# Patient Record
Sex: Male | Born: 1950 | Race: Black or African American | Hispanic: No | Marital: Single | Smoking: Current some day smoker
Health system: Southern US, Community
[De-identification: ages and names within clinical notes are randomized; demographics above are authoritative.]

## PROBLEM LIST (undated history)

## (undated) DIAGNOSIS — K5720 Diverticulitis of large intestine with perforation and abscess without bleeding: Secondary | ICD-10-CM

## (undated) DIAGNOSIS — K5792 Diverticulitis of intestine, part unspecified, without perforation or abscess without bleeding: Secondary | ICD-10-CM

## (undated) DIAGNOSIS — I5022 Chronic systolic (congestive) heart failure: Secondary | ICD-10-CM

## (undated) DIAGNOSIS — M1990 Unspecified osteoarthritis, unspecified site: Secondary | ICD-10-CM

## (undated) DIAGNOSIS — E0590 Thyrotoxicosis, unspecified without thyrotoxic crisis or storm: Secondary | ICD-10-CM

## (undated) DIAGNOSIS — J45909 Unspecified asthma, uncomplicated: Secondary | ICD-10-CM

## (undated) DIAGNOSIS — Z9119 Patient's noncompliance with other medical treatment and regimen: Secondary | ICD-10-CM

## (undated) DIAGNOSIS — I10 Essential (primary) hypertension: Secondary | ICD-10-CM

## (undated) DIAGNOSIS — I712 Thoracic aortic aneurysm, without rupture: Secondary | ICD-10-CM

## (undated) DIAGNOSIS — I428 Other cardiomyopathies: Secondary | ICD-10-CM

## (undated) DIAGNOSIS — Z91199 Patient's noncompliance with other medical treatment and regimen due to unspecified reason: Secondary | ICD-10-CM

## (undated) DIAGNOSIS — I4819 Other persistent atrial fibrillation: Secondary | ICD-10-CM

## (undated) DIAGNOSIS — I7121 Aneurysm of the ascending aorta, without rupture: Secondary | ICD-10-CM

## (undated) HISTORY — DX: Diverticulitis of large intestine with perforation and abscess without bleeding: K57.20

## (undated) HISTORY — DX: Thoracic aortic aneurysm, without rupture: I71.2

## (undated) HISTORY — PX: JOINT REPLACEMENT: SHX530

## (undated) HISTORY — DX: Chronic systolic (congestive) heart failure: I50.22

## (undated) HISTORY — PX: TESTICLE SURGERY: SHX794

## (undated) HISTORY — DX: Other cardiomyopathies: I42.8

## (undated) HISTORY — DX: Aneurysm of the ascending aorta, without rupture: I71.21

## (undated) HISTORY — DX: Diverticulitis of intestine, part unspecified, without perforation or abscess without bleeding: K57.92

---

## 2015-09-06 DIAGNOSIS — L0291 Cutaneous abscess, unspecified: Secondary | ICD-10-CM | POA: Insufficient documentation

## 2016-06-03 ENCOUNTER — Emergency Department: Payer: Medicare (Managed Care)

## 2016-06-03 ENCOUNTER — Encounter: Payer: Self-pay | Admitting: Emergency Medicine

## 2016-06-03 ENCOUNTER — Emergency Department
Admission: EM | Admit: 2016-06-03 | Discharge: 2016-06-03 | Disposition: A | Discharge status: Home / Self Care | Payer: Medicare (Managed Care) | Attending: Emergency Medicine | Admitting: Emergency Medicine

## 2016-06-03 DIAGNOSIS — I10 Essential (primary) hypertension: Secondary | ICD-10-CM | POA: Diagnosis not present

## 2016-06-03 DIAGNOSIS — Z79899 Other long term (current) drug therapy: Secondary | ICD-10-CM | POA: Insufficient documentation

## 2016-06-03 DIAGNOSIS — R0789 Other chest pain: Secondary | ICD-10-CM | POA: Diagnosis not present

## 2016-06-03 DIAGNOSIS — E0590 Thyrotoxicosis, unspecified without thyrotoxic crisis or storm: Secondary | ICD-10-CM | POA: Diagnosis not present

## 2016-06-03 DIAGNOSIS — F17200 Nicotine dependence, unspecified, uncomplicated: Secondary | ICD-10-CM | POA: Diagnosis not present

## 2016-06-03 HISTORY — DX: Essential (primary) hypertension: I10

## 2016-06-03 HISTORY — DX: Unspecified osteoarthritis, unspecified site: M19.90

## 2016-06-03 LAB — CBC
HEMATOCRIT: 41.5 % (ref 40.0–52.0)
Hemoglobin: 14 g/dL (ref 13.0–18.0)
MCH: 27.6 pg (ref 26.0–34.0)
MCHC: 33.8 g/dL (ref 32.0–36.0)
MCV: 81.7 fL (ref 80.0–100.0)
Platelets: 168 10*3/uL (ref 150–440)
RBC: 5.08 MIL/uL (ref 4.40–5.90)
RDW: 15.4 % — ABNORMAL HIGH (ref 11.5–14.5)
WBC: 4.1 10*3/uL (ref 3.8–10.6)

## 2016-06-03 LAB — TSH

## 2016-06-03 LAB — BASIC METABOLIC PANEL
ANION GAP: 5 (ref 5–15)
BUN: 13 mg/dL (ref 6–20)
CHLORIDE: 103 mmol/L (ref 101–111)
CO2: 29 mmol/L (ref 22–32)
Calcium: 8.9 mg/dL (ref 8.9–10.3)
Creatinine, Ser: 0.76 mg/dL (ref 0.61–1.24)
GFR calc Af Amer: >60 mL/min (ref >60)
GLUCOSE: 100 mg/dL — AB (ref 65–99)
POTASSIUM: 3.6 mmol/L (ref 3.5–5.1)
SODIUM: 137 mmol/L (ref 135–145)

## 2016-06-03 LAB — TROPONIN I: Troponin I: 0.03 ng/mL (ref <0.03)

## 2016-06-03 MED ORDER — LISINOPRIL 20 MG PO TABS: 20.0000 mg | ORAL_TABLET | Freq: Every day | ORAL | 0 refills | Status: DC

## 2016-06-03 MED ORDER — KETOROLAC TROMETHAMINE 30 MG/ML IJ SOLN
30.0000 mg | Freq: Once | INTRAMUSCULAR | Status: AC
  Administered 2016-06-03: 30 mg via INTRAVENOUS
  Filled 2016-06-03: qty 1

## 2016-06-03 MED ORDER — TRAMADOL HCL 50 MG PO TABS: 50.0000 mg | ORAL_TABLET | Freq: Four times a day (QID) | ORAL | 0 refills | Status: DC | PRN

## 2016-06-03 MED ORDER — TRAMADOL HCL 50 MG PO TABS
50.0000 mg | ORAL_TABLET | Freq: Once | ORAL | Status: AC
  Administered 2016-06-03: 50 mg via ORAL
  Filled 2016-06-03: qty 1

## 2016-06-03 MED ORDER — PROPYLTHIOURACIL 50 MG PO TABS: 50.0000 mg | ORAL_TABLET | Freq: Three times a day (TID) | ORAL | 0 refills | Status: DC

## 2016-06-03 MED ORDER — DIGOXIN 125 MCG PO TABS: 0.1250 mg | ORAL_TABLET | Freq: Every day | ORAL | 0 refills | Status: DC

## 2016-06-03 MED ORDER — IOPAMIDOL (ISOVUE-370) INJECTION 76%
75.0000 mL | Freq: Once | INTRAVENOUS | Status: AC | PRN
  Administered 2016-06-03: 75 mL via INTRAVENOUS

## 2016-06-03 MED ORDER — NAPROXEN SODIUM 550 MG PO TABS: 550.0000 mg | ORAL_TABLET | Freq: Two times a day (BID) | ORAL | 0 refills | Status: DC | PRN

## 2016-06-03 MED ORDER — ASPIRIN EC 325 MG PO TBEC
325.0000 mg | DELAYED_RELEASE_TABLET | Freq: Once | ORAL | Status: AC
  Administered 2016-06-03: 325 mg via ORAL
  Filled 2016-06-03: qty 1

## 2016-06-03 MED ORDER — METHIMAZOLE 10 MG PO TABS: 20.0000 mg | ORAL_TABLET | Freq: Three times a day (TID) | ORAL | 0 refills | Status: DC

## 2016-06-03 NOTE — Discharge Instructions (Signed)
Please take your medicines as prescribed.   You need to see a primary care doctor to get your meds refilled.   Your thyroid levels are off since you are out of meds. Need to have your thyroid levels rechecked after you are back on the medicines.   Return to ER if you have worse chest pain, shortness of breath, palpitations, fevers.

## 2016-06-03 NOTE — ED Triage Notes (Signed)
Pt brought over from [facility] with c/o chest pain and arthritis pain. Pt states has been out of his heart and arthritis medicines for a "while".

## 2016-06-03 NOTE — ED Notes (Signed)
Report off to kuch rn  

## 2016-06-03 NOTE — ED Notes (Signed)
meds given. Pt alert 

## 2016-06-03 NOTE — ED Notes (Signed)
Patient transported to CT 

## 2016-06-03 NOTE — ED Provider Notes (Signed)
[facility]-EMERGENCY DEPARTMENT Provider Note   CSN: [id number] Arrival date & time: 06/03/16  1404     History   Chief Complaint Chief Complaint  Patient presents with  . Chest Pain    HPI Eugene Henderson is a 65 y.o. male hx of HTN, arthritis, afib off anticoagulation, Who presented with chest pain, shortness of breath. Patient states that he ran out of his blood pressure medicines for the last month or so. Last week or so, he has been having worse shortness of breath on exertion. He states that at baseline, he uses a wheelchair since he has chronic back problems. He tries to walk to the bathroom and feels very short of breath and had chest tightness over the last week. Today he finally went to see his doctor was sent in for evaluation. Of note, patient has a history of atrial fibrillation but decided not to take his blood thinners. Also out of his high blood pressure medicines for the last month or so. He just moved here from [place] 3 months ago.   The history is provided by the patient.    Past Medical History:  Diagnosis Date  . Arthritis   . Hypertension     There are no active problems to display for this patient.   Past Surgical History:  Procedure Laterality Date  . JOINT REPLACEMENT         Home Medications    Prior to Admission medications   Medication Sig Start Date End Date Taking? Authorizing Provider  digoxin (LANOXIN) 0.125 MG tablet Take 0.125 mg by mouth daily.   Yes Historical Provider, MD  furosemide (LASIX) 40 MG tablet Take 40 mg by mouth 2 (two) times daily.   Yes Historical Provider, MD  lisinopril (PRINIVIL,ZESTRIL) 20 MG tablet Take 20 mg by mouth daily.   Yes Historical Provider, MD  methimazole (TAPAZOLE) 10 MG tablet Take 20 mg by mouth 3 (three) times daily.   Yes Historical Provider, MD  naproxen sodium (ANAPROX) 550 MG tablet Take 550 mg by mouth 2 (two) times daily as needed.   Yes Historical Provider, MD  propylthiouracil (PTU) 50 MG tablet  Take 50 mg by mouth every 8 (eight) hours.   Yes Historical Provider, MD    Family History No family history on file.  Social History Social History  Substance Use Topics  . Smoking status: Current Some Day Smoker  . Smokeless tobacco: Never Used  . Alcohol use Yes     Allergies   Patient has no known allergies.   Review of Systems Review of Systems  Respiratory: Positive for shortness of breath.   Cardiovascular: Positive for chest pain.  All other systems reviewed and are negative.    Physical Exam Updated Vital Signs BP (!) 130/100   Pulse 93   Temp 97.8 F (36.6 C) (Oral)   Resp 16   Ht 6' (1.829 m)   Wt 285 lb (129.3 kg)   SpO2 100%   BMI 38.65 kg/m   Physical Exam  Constitutional: He is oriented to person, place, and time.  Chronically ill, slightly tachy   HENT:  Head: Normocephalic.  Eyes: Pupils are equal, round, and reactive to light.  Neck: Normal range of motion. Neck supple.  Cardiovascular: Regular rhythm.   Slightly tachy   Pulmonary/Chest: Effort normal and breath sounds normal. No respiratory distress. He has no wheezes.  Abdominal: Soft. Bowel sounds are normal. He exhibits no distension. There is no tenderness.  Musculoskeletal: Normal range of  motion.  Mild bilateral calf tenderness   Neurological: He is alert and oriented to person, place, and time. He displays normal reflexes. No cranial nerve deficit. Coordination normal.  Skin: Skin is warm.  Psychiatric: He has a normal mood and affect.  Nursing note and vitals reviewed.    ED Treatments / Results  Labs (all labs ordered are listed, but only abnormal results are displayed) Labs Reviewed  BASIC METABOLIC PANEL - Abnormal; Notable for the following:       Result Value   Glucose, Bld 100 (*)    All other components within normal limits  CBC - Abnormal; Notable for the following:    RDW 15.4 (*)    All other components within normal limits  TROPONIN I - Abnormal; Notable for  the following:    Troponin I 0.03 (*)    All other components within normal limits  TSH - Abnormal; Notable for the following:    TSH <0.010 (*)    All other components within normal limits  TROPONIN I    EKG  EKG Interpretation None      ED ECG REPORT I, Richardean Canal, the attending physician, personally viewed and interpreted this ECG.   Date: 06/03/2016  EKG Time: 14:37 pm  Rate: 96  Rhythm: atrial fibrillation, rate 96  Axis: normal  Intervals:none  ST&T Change: nonspecific    Radiology Dg Chest 2 View  Result Date: 06/03/2016 CLINICAL DATA:  Pt brought over from [facility] with c/o chest pain and arthritis pain intermittent x 3-4 days. Pt states has been out of his heart and arthritis medicines for a "while". Hx HTN. Current some day smoker EXAM: CHEST  2 VIEW COMPARISON:  None. FINDINGS: Normal cardiac silhouette ectatic aorta. No effusion, infiltrate pneumothorax. Fine linear opacities at the lung bases . No pleural fluid. Lungs mildly hyperinflated. Degenerative osteophytosis of the spine. On lateral projection there is a 10 mm density projecting over the posterior column of the T6 vertebral body. IMPRESSION: 1. Mild interstitial edema or bronchiolitis pattern. 2. Pulmonary nodule versus osteophyte projecting over the spine on the lateral projection. Recommend CT thorax for evaluation. Electronically Signed   By: Genevive Bi M.D.   On: 06/03/2016 15:09   Ct Angio Chest Pe W And/or Wo Contrast  Result Date: 06/03/2016 CLINICAL DATA:  Chest pain for 2-3 days EXAM: CT ANGIOGRAPHY CHEST WITH CONTRAST TECHNIQUE: Multidetector CT imaging of the chest was performed using the standard protocol during bolus administration of intravenous contrast. Multiplanar CT image reconstructions and MIPs were obtained to evaluate the vascular anatomy. CONTRAST:  75 mL Isovue 370 COMPARISON:  None. FINDINGS: Cardiovascular: Satisfactory opacification of the pulmonary arteries to the segmental level.  No evidence of pulmonary embolism. Enlarged heart size. No pericardial effusion. Coronary artery atherosclerosis in the LAD. Mediastinum/Nodes: No axillary or hilar lymphadenopathy. Mild prominence of the mediastinal lymph nodes with the largest right lower paratracheal lymph node measuring 12 mm in short axis. Thyroid gland, trachea, and esophagus demonstrate no significant findings. Lungs/Pleura: Lungs are clear. No pleural effusion or pneumothorax. Upper Abdomen: No acute abnormality. Musculoskeletal: No chest wall abnormality. No acute or significant osseous findings. Review of the MIP images confirms the above findings. IMPRESSION: 1. No evidence of pulmonary embolus. 2. No thoracic aortic aneurysm or dissection. 3. Cardiomegaly. Electronically Signed   By: Elige Ko   On: 06/03/2016 16:53    Procedures Procedures (including critical care time)  Medications Ordered in ED Medications  aspirin EC tablet 325  mg (325 mg Oral Given 06/03/16 1610)  iopamidol (ISOVUE-370) 76 % injection 75 mL (75 mLs Intravenous Contrast Given 06/03/16 1633)  ketorolac (TORADOL) 30 MG/ML injection 30 mg (30 mg Intravenous Given 06/03/16 1859)  traMADol (ULTRAM) tablet 50 mg (50 mg Oral Given 06/03/16 1859)     Initial Impression / Assessment and Plan / ED Course  I have reviewed the triage vital signs and the nursing notes.  Pertinent labs & imaging results that were available during my care of the patient were reviewed by me and considered in my medical decision making (see chart for details).  Clinical Course    Eugene Henderson is a 65 y.o. male here with chest pain, shortness of breath. Hx of afib but not taking anticoagulants. Also wheelchair bound so at risk for PE. Will get labs, trop x 2, CT angio.   7:00 PM CT showed no PE. Delta trop neg (initial was 0.03 and second one is < 0.03). TSH < 0.01. He has been out of his methimazole, PTU for a month and likely explains this. I will refill his home meds.        Final Clinical Impressions(s) / ED Diagnoses   Final diagnoses:  None    New Prescriptions New Prescriptions   No medications on file     Charlynne Pander, MD 06/03/16 1902

## 2016-06-03 NOTE — ED Notes (Signed)
Pt alert.  nsr on monitor.   Iv in place.  Skin warm and dry. pt Watching tv

## 2016-06-03 NOTE — ED Notes (Signed)
Pt reports chest pain for 2-3 days.  Pt out of meds for 1 month.  Pt has sob intermittently.  Pt has tightness across chest today.  Pt alert.  md at bedside.

## 2016-07-28 ENCOUNTER — Encounter: Payer: Self-pay | Admitting: Emergency Medicine

## 2016-07-28 ENCOUNTER — Emergency Department: Payer: Medicare (Managed Care)

## 2016-07-28 ENCOUNTER — Inpatient Hospital Stay
Admission: EM | Admit: 2016-07-28 | Discharge: 2016-08-04 | DRG: 287 | Disposition: A | Discharge status: Home Health | Payer: Medicare (Managed Care) | Attending: Internal Medicine | Admitting: Internal Medicine

## 2016-07-28 DIAGNOSIS — Z9119 Patient's noncompliance with other medical treatment and regimen: Secondary | ICD-10-CM | POA: Diagnosis not present

## 2016-07-28 DIAGNOSIS — I5033 Acute on chronic diastolic (congestive) heart failure: Secondary | ICD-10-CM

## 2016-07-28 DIAGNOSIS — I4892 Unspecified atrial flutter: Secondary | ICD-10-CM | POA: Diagnosis present

## 2016-07-28 DIAGNOSIS — F1010 Alcohol abuse, uncomplicated: Secondary | ICD-10-CM | POA: Diagnosis present

## 2016-07-28 DIAGNOSIS — F17210 Nicotine dependence, cigarettes, uncomplicated: Secondary | ICD-10-CM | POA: Diagnosis present

## 2016-07-28 DIAGNOSIS — F419 Anxiety disorder, unspecified: Secondary | ICD-10-CM | POA: Diagnosis present

## 2016-07-28 DIAGNOSIS — Z9114 Patient's other noncompliance with medication regimen: Secondary | ICD-10-CM | POA: Diagnosis not present

## 2016-07-28 DIAGNOSIS — I110 Hypertensive heart disease with heart failure: Secondary | ICD-10-CM | POA: Diagnosis present

## 2016-07-28 DIAGNOSIS — Z833 Family history of diabetes mellitus: Secondary | ICD-10-CM

## 2016-07-28 DIAGNOSIS — I248 Other forms of acute ischemic heart disease: Secondary | ICD-10-CM | POA: Diagnosis present

## 2016-07-28 DIAGNOSIS — Z23 Encounter for immunization: Secondary | ICD-10-CM

## 2016-07-28 DIAGNOSIS — I481 Persistent atrial fibrillation: Secondary | ICD-10-CM

## 2016-07-28 DIAGNOSIS — R7989 Other specified abnormal findings of blood chemistry: Secondary | ICD-10-CM | POA: Diagnosis present

## 2016-07-28 DIAGNOSIS — R002 Palpitations: Secondary | ICD-10-CM | POA: Diagnosis not present

## 2016-07-28 DIAGNOSIS — E0590 Thyrotoxicosis, unspecified without thyrotoxic crisis or storm: Secondary | ICD-10-CM | POA: Diagnosis present

## 2016-07-28 DIAGNOSIS — R0602 Shortness of breath: Secondary | ICD-10-CM | POA: Diagnosis not present

## 2016-07-28 DIAGNOSIS — I4819 Other persistent atrial fibrillation: Secondary | ICD-10-CM | POA: Diagnosis present

## 2016-07-28 DIAGNOSIS — I10 Essential (primary) hypertension: Secondary | ICD-10-CM

## 2016-07-28 DIAGNOSIS — I255 Ischemic cardiomyopathy: Secondary | ICD-10-CM | POA: Diagnosis present

## 2016-07-28 DIAGNOSIS — Z79899 Other long term (current) drug therapy: Secondary | ICD-10-CM | POA: Diagnosis not present

## 2016-07-28 DIAGNOSIS — R0600 Dyspnea, unspecified: Secondary | ICD-10-CM | POA: Diagnosis not present

## 2016-07-28 DIAGNOSIS — R000 Tachycardia, unspecified: Secondary | ICD-10-CM | POA: Diagnosis present

## 2016-07-28 DIAGNOSIS — I5043 Acute on chronic combined systolic (congestive) and diastolic (congestive) heart failure: Secondary | ICD-10-CM | POA: Diagnosis not present

## 2016-07-28 DIAGNOSIS — R0603 Acute respiratory distress: Secondary | ICD-10-CM | POA: Diagnosis not present

## 2016-07-28 DIAGNOSIS — I272 Pulmonary hypertension, unspecified: Secondary | ICD-10-CM | POA: Diagnosis present

## 2016-07-28 DIAGNOSIS — R778 Other specified abnormalities of plasma proteins: Secondary | ICD-10-CM

## 2016-07-28 DIAGNOSIS — R748 Abnormal levels of other serum enzymes: Secondary | ICD-10-CM | POA: Diagnosis not present

## 2016-07-28 DIAGNOSIS — R911 Solitary pulmonary nodule: Secondary | ICD-10-CM | POA: Diagnosis present

## 2016-07-28 DIAGNOSIS — E876 Hypokalemia: Secondary | ICD-10-CM | POA: Diagnosis not present

## 2016-07-28 DIAGNOSIS — Z91148 Patient's other noncompliance with medication regimen for other reason: Secondary | ICD-10-CM

## 2016-07-28 DIAGNOSIS — Z6839 Body mass index (BMI) 39.0-39.9, adult: Secondary | ICD-10-CM | POA: Diagnosis not present

## 2016-07-28 HISTORY — DX: Thyrotoxicosis, unspecified without thyrotoxic crisis or storm: E05.90

## 2016-07-28 HISTORY — DX: Unspecified asthma, uncomplicated: J45.909

## 2016-07-28 HISTORY — DX: Patient's noncompliance with other medical treatment and regimen due to unspecified reason: Z91.199

## 2016-07-28 HISTORY — DX: Patient's noncompliance with other medical treatment and regimen: Z91.19

## 2016-07-28 HISTORY — DX: Other persistent atrial fibrillation: I48.19

## 2016-07-28 LAB — PROTIME-INR
INR: 1.07
PROTHROMBIN TIME: 13.9 s (ref 11.4–15.2)

## 2016-07-28 LAB — CBC WITH DIFFERENTIAL/PLATELET
BASOS ABS: 0 10*3/uL (ref 0–0.1)
BASOS PCT: 0 %
EOS PCT: 2 %
Eosinophils Absolute: 0.1 10*3/uL (ref 0–0.7)
HCT: 42.2 % (ref 40.0–52.0)
Hemoglobin: 14.3 g/dL (ref 13.0–18.0)
Lymphocytes Relative: 27 %
Lymphs Abs: 1.3 10*3/uL (ref 1.0–3.6)
MCH: 27.5 pg (ref 26.0–34.0)
MCHC: 33.9 g/dL (ref 32.0–36.0)
MCV: 81.1 fL (ref 80.0–100.0)
MONO ABS: 0.7 10*3/uL (ref 0.2–1.0)
Monocytes Relative: 14 %
Neutro Abs: 2.8 10*3/uL (ref 1.4–6.5)
Neutrophils Relative %: 57 %
PLATELETS: 159 10*3/uL (ref 150–440)
RBC: 5.21 MIL/uL (ref 4.40–5.90)
RDW: 15.9 % — AB (ref 11.5–14.5)
WBC: 5 10*3/uL (ref 3.8–10.6)

## 2016-07-28 LAB — BASIC METABOLIC PANEL
Anion gap: 7 (ref 5–15)
BUN: 13 mg/dL (ref 6–20)
CO2: 28 mmol/L (ref 22–32)
CREATININE: 0.77 mg/dL (ref 0.61–1.24)
Calcium: 9 mg/dL (ref 8.9–10.3)
Chloride: 102 mmol/L (ref 101–111)
Glucose, Bld: 133 mg/dL — ABNORMAL HIGH (ref 65–99)
POTASSIUM: 3.8 mmol/L (ref 3.5–5.1)
SODIUM: 137 mmol/L (ref 135–145)

## 2016-07-28 LAB — TROPONIN I
TROPONIN I: 0.05 ng/mL — AB (ref <0.03)
Troponin I: 0.04 ng/mL (ref <0.03)
Troponin I: 0.04 ng/mL (ref <0.03)
Troponin I: 0.04 ng/mL (ref <0.03)

## 2016-07-28 LAB — FIBRIN DERIVATIVES D-DIMER (ARMC ONLY): FIBRIN DERIVATIVES D-DIMER (ARMC): 1049 — AB (ref 0–499)

## 2016-07-28 LAB — GLUCOSE, CAPILLARY: Glucose-Capillary: 127 mg/dL — ABNORMAL HIGH (ref 65–99)

## 2016-07-28 LAB — APTT: APTT: 25 s (ref 24–36)

## 2016-07-28 LAB — MAGNESIUM: Magnesium: 1.7 mg/dL (ref 1.7–2.4)

## 2016-07-28 LAB — BRAIN NATRIURETIC PEPTIDE: B NATRIURETIC PEPTIDE 5: 526 pg/mL — AB (ref 0.0–100.0)

## 2016-07-28 LAB — HEPARIN LEVEL (UNFRACTIONATED): HEPARIN UNFRACTIONATED: 0.27 [IU]/mL — AB (ref 0.30–0.70)

## 2016-07-28 MED ORDER — FUROSEMIDE 10 MG/ML IJ SOLN
20.0000 mg | Freq: Two times a day (BID) | INTRAMUSCULAR | Status: DC
  Administered 2016-07-28 – 2016-07-29 (×2): 20 mg via INTRAVENOUS
  Filled 2016-07-28 (×2): qty 2

## 2016-07-28 MED ORDER — HEPARIN (PORCINE) IN NACL 100-0.45 UNIT/ML-% IJ SOLN: 12.0000 [IU]/kg/h | Freq: Once | INTRAMUSCULAR | Status: DC

## 2016-07-28 MED ORDER — NAPROXEN 500 MG PO TABS
500.0000 mg | ORAL_TABLET | Freq: Once | ORAL | Status: AC
  Administered 2016-07-28: 500 mg via ORAL
  Filled 2016-07-28: qty 1

## 2016-07-28 MED ORDER — TRAMADOL HCL 50 MG PO TABS
50.0000 mg | ORAL_TABLET | Freq: Four times a day (QID) | ORAL | Status: DC | PRN
  Administered 2016-07-28 – 2016-08-04 (×20): 50 mg via ORAL
  Filled 2016-07-28 (×19): qty 1

## 2016-07-28 MED ORDER — ONDANSETRON HCL 4 MG/2ML IJ SOLN: 4.0000 mg | Freq: Four times a day (QID) | INTRAMUSCULAR | Status: DC | PRN

## 2016-07-28 MED ORDER — HEPARIN BOLUS VIA INFUSION
1600.0000 [IU] | Freq: Once | INTRAVENOUS | Status: AC
  Administered 2016-07-28: 1600 [IU] via INTRAVENOUS
  Filled 2016-07-28: qty 1600

## 2016-07-28 MED ORDER — ONDANSETRON HCL 4 MG PO TABS: 4.0000 mg | ORAL_TABLET | Freq: Four times a day (QID) | ORAL | Status: DC | PRN

## 2016-07-28 MED ORDER — ACETAMINOPHEN 325 MG PO TABS
650.0000 mg | ORAL_TABLET | Freq: Four times a day (QID) | ORAL | Status: DC | PRN
  Administered 2016-07-28: 650 mg via ORAL
  Filled 2016-07-28: qty 2

## 2016-07-28 MED ORDER — NAPROXEN 500 MG PO TABS: 250.0000 mg | ORAL_TABLET | Freq: Once | ORAL | Status: DC

## 2016-07-28 MED ORDER — ENOXAPARIN SODIUM 150 MG/ML ~~LOC~~ SOLN
1.0000 mg/kg | Freq: Once | SUBCUTANEOUS | Status: DC
  Filled 2016-07-28: qty 0.88

## 2016-07-28 MED ORDER — NITROGLYCERIN 2 % TD OINT
1.0000 [in_us] | TOPICAL_OINTMENT | Freq: Once | TRANSDERMAL | Status: AC
  Administered 2016-07-28: 1 [in_us] via TOPICAL
  Filled 2016-07-28: qty 1

## 2016-07-28 MED ORDER — SODIUM CHLORIDE 0.9% FLUSH
3.0000 mL | Freq: Two times a day (BID) | INTRAVENOUS | Status: DC
  Administered 2016-07-29 – 2016-08-01 (×6): 3 mL via INTRAVENOUS

## 2016-07-28 MED ORDER — HEPARIN BOLUS VIA INFUSION
4000.0000 [IU] | Freq: Once | INTRAVENOUS | Status: AC
  Administered 2016-07-28: 4000 [IU] via INTRAVENOUS
  Filled 2016-07-28: qty 4000

## 2016-07-28 MED ORDER — POTASSIUM CHLORIDE CRYS ER 20 MEQ PO TBCR
20.0000 meq | EXTENDED_RELEASE_TABLET | Freq: Once | ORAL | Status: AC
  Administered 2016-08-02: 20 meq via ORAL
  Filled 2016-07-28: qty 1

## 2016-07-28 MED ORDER — METHIMAZOLE 10 MG PO TABS
20.0000 mg | ORAL_TABLET | Freq: Three times a day (TID) | ORAL | Status: DC
  Administered 2016-07-28 – 2016-08-04 (×20): 20 mg via ORAL
  Filled 2016-07-28 (×22): qty 2

## 2016-07-28 MED ORDER — IOPAMIDOL (ISOVUE-370) INJECTION 76%
75.0000 mL | Freq: Once | INTRAVENOUS | Status: AC | PRN
  Administered 2016-07-28: 75 mL via INTRAVENOUS

## 2016-07-28 MED ORDER — ACETAMINOPHEN 650 MG RE SUPP: 650.0000 mg | Freq: Four times a day (QID) | RECTAL | Status: DC | PRN

## 2016-07-28 MED ORDER — ASPIRIN 81 MG PO CHEW: 324.0000 mg | CHEWABLE_TABLET | Freq: Once | ORAL | Status: DC

## 2016-07-28 MED ORDER — HEPARIN SODIUM (PORCINE) 5000 UNIT/ML IJ SOLN: 4000.0000 [IU] | Freq: Once | INTRAMUSCULAR | Status: DC

## 2016-07-28 MED ORDER — HEPARIN (PORCINE) IN NACL 100-0.45 UNIT/ML-% IJ SOLN
1500.0000 [IU]/h | INTRAMUSCULAR | Status: DC
  Administered 2016-07-28: 1500 [IU]/h via INTRAVENOUS
  Filled 2016-07-28: qty 250

## 2016-07-28 MED ORDER — LISINOPRIL 20 MG PO TABS: 20.0000 mg | ORAL_TABLET | Freq: Every day | ORAL | Status: DC

## 2016-07-28 MED ORDER — METOPROLOL TARTRATE 25 MG PO TABS
25.0000 mg | ORAL_TABLET | Freq: Two times a day (BID) | ORAL | Status: DC
  Administered 2016-07-28: 25 mg via ORAL
  Filled 2016-07-28: qty 1

## 2016-07-28 MED ORDER — HEPARIN (PORCINE) IN NACL 100-0.45 UNIT/ML-% IJ SOLN
1700.0000 [IU]/h | INTRAMUSCULAR | Status: DC
  Administered 2016-07-28 – 2016-07-29 (×2): 1700 [IU]/h via INTRAVENOUS
  Filled 2016-07-28 (×2): qty 250

## 2016-07-28 NOTE — Progress Notes (Addendum)
ANTICOAGULATION CONSULT NOTE - Initial Consult  Pharmacy Consult for Heparin drip Indication: atrial fibrillation  No Known Allergies  Patient Measurements: Height: 6' (182.9 cm) Weight: 290 lb (131.5 kg) IBW/kg (Calculated) : 77.6 Heparin Dosing Weight: 107.4 kg  Vital Signs: Temp: 98.1 F (36.7 C) (02/21 0749) BP: 138/108 (02/21 0800) Pulse Rate: 90 (02/21 0800)  Labs:  Recent Labs (last 2 labs)    Recent Labs  07/28/16 0901  HGB 14.3  HCT 42.2  PLT 159  APTT 25  LABPROT 13.9  INR 1.07  CREATININE 0.77  TROPONINI 0.05*      Estimated Creatinine Clearance: 129.2 mL/min (by C-G formula based on SCr of 0.77 mg/dL).   Medical History:     Past Medical History:  Diagnosis Date  . Arthritis   . Asthma   . Hypertension       Assessment: 66 yo male starting on heparin drip for AFib. Pt does not appear to be on anticoagulation as an outpt.   Hgb 14.3, Plt 159, INR 1.07, aPTT 25  Goal of Therapy:  Heparin level 0.3-0.7 units/ml Monitor platelets by anticoagulation protocol: Yes   Plan:  Heparin bolus 4000 units IV x1 then heparin drip at 1500 units/hr (=15 ml/hr) Heparin level 6h after start of drip CBC in AM  2/21 @ 1700 HL 0.27 -- will give 1600 unit bolus x 1 (16 unit/kg based on heparin Dw) then will increase infusion to 1700 units/hr and will recheck HL 2/22 @ 0200 (6 hours after start of new infusion).  2/22 02:00 heparin level 0.43. Continue current regimen and recheck in 6 hours to confirm.  Thank you for this consult.  Fulton Reek, PharmD, BCPS  07/29/16    07/28/2016

## 2016-07-28 NOTE — Progress Notes (Signed)
ANTICOAGULATION CONSULT NOTE - Initial Consult  Pharmacy Consult for Heparin drip Indication: atrial fibrillation  No Known Allergies  Patient Measurements: Height: 6' (182.9 cm) Weight: 290 lb (131.5 kg) IBW/kg (Calculated) : 77.6 Heparin Dosing Weight: 107.4 kg  Vital Signs: Temp: 98.1 F (36.7 C) (02/21 0749) BP: 138/108 (02/21 0800) Pulse Rate: 90 (02/21 0800)  Labs:  Recent Labs  07/28/16 0901  HGB 14.3  HCT 42.2  PLT 159  APTT 25  LABPROT 13.9  INR 1.07  CREATININE 0.77  TROPONINI 0.05*    Estimated Creatinine Clearance: 129.2 mL/min (by C-G formula based on SCr of 0.77 mg/dL).   Medical History: Past Medical History:  Diagnosis Date  . Arthritis   . Asthma   . Hypertension       Assessment: 66 yo male starting on heparin drip for AFib. Pt does not appear to be on anticoagulation as an outpt.   Hgb 14.3, Plt 159, INR 1.07, aPTT 25  Goal of Therapy:  Heparin level 0.3-0.7 units/ml Monitor platelets by anticoagulation protocol: Yes   Plan:  Heparin bolus 4000 units IV x1 then heparin drip at 1500 units/hr (=15 ml/hr) Heparin level 6h after start of drip CBC in AM  Pharmacy will continue to follow.   Crist Fat L 07/28/2016,10:46 AM

## 2016-07-28 NOTE — ED Provider Notes (Signed)
[facility] Emergency Department Provider Note        Time seen: ----------------------------------------- 8:08 AM on 07/28/2016 -----------------------------------------    I have reviewed the triage vital signs and the nursing notes.   HISTORY  Chief Complaint Shortness of Breath    HPI Eugene Henderson is a 66 y.o. male who presents to the ER being brought by EMS from home. Patient called EMS complaining of difficulty breathing. Patient states he was in bed and was not sure if he rolled onto his stomach. At that point he began having significant trouble breathing. Patient states he then sat on the side of the bed and still could not breathe. He then had family called EMS. He was diffusely diaphoretic he was also having some chest tightness and noting palpitations. He denies any recent illness or other complaints. Shortness of breath has somewhat improved since that time.   Past Medical History:  Diagnosis Date  . Arthritis   . Hypertension     There are no active problems to display for this patient.   Past Surgical History:  Procedure Laterality Date  . JOINT REPLACEMENT      Allergies Patient has no known allergies.  Social History Social History  Substance Use Topics  . Smoking status: Current Some Day Smoker  . Smokeless tobacco: Never Used  . Alcohol use Yes    Review of Systems Constitutional: Negative for fever. Cardiovascular: Positive for chest tightness, palpitations Respiratory: Positive for shortness of breath Gastrointestinal: Negative for abdominal pain, vomiting and diarrhea. Genitourinary: Negative for dysuria. Musculoskeletal: Negative for back pain. Skin: Negative for rash. Neurological: Negative for headaches, focal weakness or numbness.  10-point ROS otherwise negative.  ____________________________________________   PHYSICAL EXAM:  VITAL SIGNS: ED Triage Vitals  Enc Vitals Group     BP --      Pulse  Rate 07/28/16 0749 97     Resp 07/28/16 0749 (!) 21     Temp 07/28/16 0749 98.1 F (36.7 C)     Temp src --      SpO2 07/28/16 0749 100 %     Weight 07/28/16 0751 290 lb (131.5 kg)     Height 07/28/16 0751 6' (1.829 m)     Head Circumference --      Peak Flow --      Pain Score --      Pain Loc --      Pain Edu? --      Excl. in GC? --     Constitutional: Alert and oriented. Well appearing and in no distress. Eyes: Conjunctivae are normal. PERRL. Normal extraocular movements. ENT   Head: Normocephalic and atraumatic.   Nose: No congestion/rhinnorhea.   Mouth/Throat: Mucous membranes are moist.   Neck: No stridor. Cardiovascular:Irregularly irregular rhythm. No murmurs, rubs, or gallops. Respiratory: Normal respiratory effort without tachypnea nor retractions. Breath sounds are clear and equal bilaterally. No wheezes/rales/rhonchi. Gastrointestinal: Soft and nontender. Normal bowel sounds Musculoskeletal: Nontender with normal range of motion in all extremities. No lower extremity tenderness nor edema. Neurologic:  Normal speech and language. No gross focal neurologic deficits are appreciated.  Skin:  Skin is warm, dry and intact. No rash noted. Psychiatric: Mood and affect are normal. Speech and behavior are normal.  ____________________________________________  EKG: Interpreted by me. Atrial fibrillation with a rate of 103 bpm, normal QRS, long QT, leftward axis  ____________________________________________  ED COURSE:  Pertinent labs & imaging results that were available during my care of the patient  were reviewed by me and considered in my medical decision making (see chart for details). Patient presents to ER with dyspnea of unclear etiology. He is in atrial fibrillation. We will assess with labs and imaging.   Procedures ____________________________________________   LABS (pertinent positives/negatives)  Labs Reviewed  FIBRIN DERIVATIVES D-DIMER ([facility]  ONLY) - Abnormal; Notable for the following:       Result Value   Fibrin derivatives D-dimer ([facility]) 1,049 (*)    All other components within normal limits  BASIC METABOLIC PANEL - Abnormal; Notable for the following:    Glucose, Bld 133 (*)    All other components within normal limits  CBC WITH DIFFERENTIAL/PLATELET - Abnormal; Notable for the following:    RDW 15.9 (*)    All other components within normal limits  TROPONIN I - Abnormal; Notable for the following:    Troponin I 0.05 (*)    All other components within normal limits  PROTIME-INR  APTT  CBC WITH DIFFERENTIAL/PLATELET  BRAIN NATRIURETIC PEPTIDE    RADIOLOGY Images were viewed by me  Chest x-ray CTA chest / IMPRESSION: No edema or consolidation. Stable cardiomegaly. There is left carotid artery atherosclerosis. IMPRESSION: 1. No evidence of central pulmonary emboli. Segmental assessment limited by motion. 2. 3 mm right lower lobe lung nodule. No follow-up needed if patient is low-risk. Non-contrast chest CT can be considered in 12 months if patient is high-risk. This recommendation follows the consensus statement: Guidelines for Management of Incidental Pulmonary Nodules Detected on CT Images: From the Fleischner Society 2017; Radiology 2017; 284:228-243. 3. Cardiomegaly and aortic atherosclerosis.  CRITICAL CARE Performed by: Emily Filbert   Total critical care time: 30 minutes  Critical care time was exclusive of separately billable procedures and treating other patients.  Critical care was necessary to treat or prevent imminent or life-threatening deterioration.  Critical care was time spent personally by me on the following activities: development of treatment plan with patient and/or surrogate as well as nursing, discussions with consultants, evaluation of patient's response to treatment, examination of patient, obtaining history from patient or surrogate, ordering and performing treatments and  interventions, ordering and review of laboratory studies, ordering and review of radiographic studies, pulse oximetry and re-evaluation of patient's condition.  ____________________________________________  FINAL ASSESSMENT AND PLAN  Dyspnea, atrial fibrillation, elevated troponin  Plan: Patient with labs and imaging as dictated above. Patient does have a history of A. fib but is not on any anticoagulation. He is also run out of all of his medicines he states. He has received aspirin and nitroglycerin. I will order heparin for him. I will discuss with the hospitalist for admission at this time. No clear etiology for markedly elevated d-dimer as his CT is negative.   Emily Filbert, MD   Note: This note was generated in part or whole with voice recognition software. Voice recognition is usually quite accurate but there are transcription errors that can and very often do occur. I apologize for any typographical errors that were not detected and corrected.     Emily Filbert, MD 07/28/16 [phone]

## 2016-07-28 NOTE — ED Triage Notes (Signed)
Pt arrived to ED by EMS from home. Pt called EMS with c/o of difficulty breathing, palpitations and chest tightness. Pt states sudden onset upon waking. Per EMS pt Afib and diaphoretic upon symptom onset. Pt denies pain at this time.

## 2016-07-28 NOTE — Progress Notes (Signed)
Chaplain visited patient while rounding. Patient had theological questions about God's mercy because he felt that he was saved and then fell from grace. Patient felt that the [facility] abandoned him and the [place] God shall not return. Chaplain explained to patient how God's love is greater than we think and how no sin God is not able to forgive. Chaplain and patient spent almost an hour talking about his past life and how God is still in his life. Chaplain prayed for patient and anointed him after the prayer. Patient was in tears of joy when Chaplain left the room. Learning that his sins were forgiven was very comforting to patient.

## 2016-07-28 NOTE — Consult Note (Signed)
Cardiology Consultation Note  Patient ID: Eugene Henderson, MRN: [id number], DOB/AGE: 66-Sep-1952 66 y.o. Admit date: 07/28/2016   Date of Consult: 07/28/2016 Primary Physician: No PCP Per Patient Primary Cardiologist: New to [facility]- consult by [place] Requesting Physician: Dr. Cherlynn Kaiser, MD  Chief Complaint: SOB Reason for Consult: Persistent Afib with RVR/SOB/elevated troponin  HPI: 66 y.o. male with h/o hyperthyroidism previously on methimazole though not taking for approximately one month, persistent atrial fibrillation not on full dose anticoagulation as an outpatient, hypertension, ongoing tobacco abuse, alcohol abuse, and obesity who presented to [facility] ED today with increased shortness of breath for approximately 6-8 weeks and was noted to be in persistent Afib with intermittent RVR.  Patient recently moved to [place] from [place] approximately 5-6 months ago. He tells me he has never seen a cardiologist before. Though does note prior history of "irregular heartbeat." He also tells me he was previously on a blood thinner though does not know if this was anticoagulation or antiplatelet or why he was on this medication. He was seen in the ED at [facility] in late December for chest pain, along with medication refill as he had been out of his home medications for several months. At that time he was found to have initial troponin of 0.03 with subsequent level being negative. TSH was noted to be less than 0.010, CBC unremarkable, renal function normal, potassium 3.6. Chest x-ray at that time showed mild interstitial edema with pulmonary nodule noted. He also underwent a CTA chest to evaluate for pulmonary embolus which was negative. He was restarted on his methimazole and advised outpatient follow-up. The patient did not follow-up as an outpatient.  He presented to [facility] ED on 2/21 with increased shortness of breath for the past 6-8 weeks. His shortness of breath has been worse with exertion and generally  improves with rest however he has occasionally noted this with rest. He denies any chest pain, palpitations, diaphoresis, nausea, vomiting, dizziness, presyncope, or syncope. He has been out of his medications for approximately one month. He notes there is difficulty in affording his medications as he is on a fixed income. Upon his arrival to [facility] ED today he was noted to be in A. fib with intermittent RVR with heart rates ranging from the 80s beats per minute to 1 teens beats per minute. Chest x-ray showed stable cardiomegaly with no edema or consolidation. Labs showed an initial troponin of 0.05, serum creatinine 0.77, potassium 3.8, unremarkable CBC, BNP pending, d-dimer elevated at 1, 049. He underwent CTA chest that was negative for PE, though did show a 3 mm right lower lobe lung nodule with recommended outpatient follow-up in 12 months. Cardiomegaly and aortic atherosclerosis were also noted. He was admitted for his shortness of breath as well as atrial fibrillation. Upon admission he was started with gentle IV diuresis, 2-D echo was ordered, he was restarted on his home methimazole, placed on a heparin drip given his atrial fibrillation, started on metoprolol and continued on lisinopril. Upon cardiology seeing the patient he was asymptomatic, resting comfortably in his bed.  Past Medical History:  Diagnosis Date  . Arthritis   . Asthma   . Hypertension   . Hyperthyroidism   . Noncompliance   . Persistent atrial fibrillation (HCC)       Most Recent Cardiac Studies: TTE pending    Surgical History:  Past Surgical History:  Procedure Laterality Date  . JOINT REPLACEMENT       Home Meds: Prior to Admission medications  Medication Sig Start Date End Date Taking? Authorizing Provider  furosemide (LASIX) 40 MG tablet Take 40 mg by mouth 2 (two) times daily.   Yes Historical Provider, MD  naproxen sodium (ANAPROX) 220 MG tablet Take 220 mg by mouth 2 (two) times daily with a meal.   Yes  Historical Provider, MD  digoxin (LANOXIN) 0.125 MG tablet Take 1 tablet (0.125 mg total) by mouth daily. Patient not taking: Reported on 07/28/2016 06/03/16   Charlynne Pander, MD  lisinopril (PRINIVIL,ZESTRIL) 20 MG tablet Take 1 tablet (20 mg total) by mouth daily. Patient not taking: Reported on 07/28/2016 06/03/16   Charlynne Pander, MD  methimazole (TAPAZOLE) 10 MG tablet Take 2 tablets (20 mg total) by mouth 3 (three) times daily. Patient not taking: Reported on 07/28/2016 06/03/16   Charlynne Pander, MD  naproxen sodium (ANAPROX) 550 MG tablet Take 1 tablet (550 mg total) by mouth 2 (two) times daily as needed. Patient not taking: Reported on 07/28/2016 06/03/16   Charlynne Pander, MD  propylthiouracil (PTU) 50 MG tablet Take 1 tablet (50 mg total) by mouth every 8 (eight) hours. Patient not taking: Reported on 07/28/2016 06/03/16   Charlynne Pander, MD  traMADol (ULTRAM) 50 MG tablet Take 1 tablet (50 mg total) by mouth every 6 (six) hours as needed. Patient not taking: Reported on 07/28/2016 06/03/16 06/03/17  Charlynne Pander, MD    Inpatient Medications:  . furosemide  20 mg Intravenous Q12H  . lisinopril  20 mg Oral Daily  . methimazole  20 mg Oral TID  . metoprolol tartrate  25 mg Oral BID  . sodium chloride flush  3 mL Intravenous Q12H   . heparin 1,500 Units/hr (07/28/16 1109)    Allergies: No Known Allergies  Social History   Social History  . Marital status: Single    Spouse name: N/A  . Number of children: N/A  . Years of education: N/A   Occupational History  . Not on file.   Social History Main Topics  . Smoking status: Current Some Day Smoker    Packs/day: 0.25    Years: 25.00    Types: Cigarettes  . Smokeless tobacco: Never Used  . Alcohol use Yes     Comment: Socially - once a month  . Drug use: No  . Sexual activity: Not on file   Other Topics Concern  . Not on file   Social History Narrative  . No narrative on file     Family History    Problem Relation Age of Onset  . Diabetes Mother   . Diabetes Father   . Diabetes Brother      Review of Systems: Review of Systems  Constitutional: Positive for malaise/fatigue. Negative for chills, diaphoresis, fever and weight loss.  HENT: Negative for congestion.   Eyes: Negative for discharge and redness.  Respiratory: Positive for cough and shortness of breath. Negative for hemoptysis, sputum production and wheezing.   Cardiovascular: Positive for palpitations, orthopnea and leg swelling. Negative for chest pain, claudication and PND.  Gastrointestinal: Negative for abdominal pain, blood in stool, heartburn, melena, nausea and vomiting.  Genitourinary: Negative for hematuria.  Musculoskeletal: Negative for falls and myalgias.  Skin: Negative for rash.  Neurological: Positive for weakness. Negative for dizziness, tingling, tremors, sensory change, speech change, focal weakness and loss of consciousness.  Endo/Heme/Allergies: Does not bruise/bleed easily.  Psychiatric/Behavioral: Negative for substance abuse. The patient is not nervous/anxious.   All other systems reviewed and are  negative.   Labs:  Recent Labs  07/28/16 0901  TROPONINI 0.05*   Lab Results  Component Value Date   WBC 5.0 07/28/2016   HGB 14.3 07/28/2016   HCT 42.2 07/28/2016   MCV 81.1 07/28/2016   PLT 159 07/28/2016     Recent Labs Lab 07/28/16 0901  NA 137  K 3.8  CL 102  CO2 28  BUN 13  CREATININE 0.77  CALCIUM 9.0  GLUCOSE 133*   No results found for: CHOL, HDL, LDLCALC, TRIG No results found for: DDIMER  Radiology/Studies:  Dg Chest 2 View  Result Date: 07/28/2016 CLINICAL DATA:  Shortness of breath with cardiac palpitations EXAM: CHEST  2 VIEW COMPARISON:  June 03, 2016 chest radiograph and chest CT June 03, 2016 FINDINGS: There is no edema or consolidation. Heart is enlarged, stable. The pulmonary vascularity is normal. There is mild degenerative change in the thoracic  spine. There is atherosclerotic calcification in the left carotid artery. IMPRESSION: No edema or consolidation. Stable cardiomegaly. There is left carotid artery atherosclerosis. Electronically Signed   By: Bretta Bang III M.D.   On: 07/28/2016 08:32   Ct Angio Chest Pe W And/or Wo Contrast  Result Date: 07/28/2016 CLINICAL DATA:  Dyspnea. EXAM: CT ANGIOGRAPHY CHEST WITH CONTRAST TECHNIQUE: Multidetector CT imaging of the chest was performed using the standard protocol during bolus administration of intravenous contrast. Multiplanar CT image reconstructions and MIPs were obtained to evaluate the vascular anatomy. CONTRAST:  75 mL Isovue 370 COMPARISON:  06/03/2016 chest CTA FINDINGS: Cardiovascular: Pulmonary arterial opacification is satisfactory without evidence of lobar or more central emboli. No definite segmental emboli are identified, however evaluation is limited by motion artifact. The heart is enlarged. LAD coronary artery calcification is noted. There is thoracic aortic atherosclerosis without aneurysm. No pericardial effusion. Mediastinum/Nodes: Scattered calcified mediastinal and hilar lymph nodes are noted. Borderline enlarged noncalcified mediastinal lymph nodes are unchanged, with the largest measuring 11 mm in short axis in the lower right paratracheal station. The esophagus, thyroid, and trachea are unremarkable. Lungs/Pleura: No pleural effusion or pneumothorax. There is a 3 mm subpleural right lower lobe nodule adjacent to the major fissure which was not clearly present on the prior CT (series 5, image 65). No evidence of pneumonia or edema. Upper Abdomen: No acute abnormality. Musculoskeletal: Thoracic spondylosis. Review of the MIP images confirms the above findings. IMPRESSION: 1. No evidence of central pulmonary emboli. Segmental assessment limited by motion. 2. 3 mm right lower lobe lung nodule. No follow-up needed if patient is low-risk. Non-contrast chest CT can be considered in  12 months if patient is high-risk. This recommendation follows the consensus statement: Guidelines for Management of Incidental Pulmonary Nodules Detected on CT Images: From the Fleischner Society 2017; Radiology 2017; 284:228-243. 3. Cardiomegaly and aortic atherosclerosis. Electronically Signed   By: Sebastian Ache M.D.   On: 07/28/2016 10:16    EKG: Interpreted by me showed: Coarse Afib with RVR, 103 bpm, rare PVC, nonspecific ST-T changes Telemetry: Interpreted by me showed: Afib, rate controlled for the most part, with heart rates in the 80s to low 100s beats per minute Weights: Filed Weights   07/28/16 0751  Weight: 290 lb (131.5 kg)     Physical Exam: Blood pressure 108/77, pulse 76, temperature 98.1 F (36.7 C), resp. rate 14, height 6' (1.829 m), weight 290 lb (131.5 kg), SpO2 98 %. Body mass index is 39.33 kg/m. General: Well developed, well nourished, in no acute distress. Head: Normocephalic, atraumatic, sclera non-icteric,  no xanthomas, nares are without discharge.  Neck: Negative for carotid bruits. JVD mildly elevated. Lungs: Clear bilaterally to auscultation without wheezes, rales, or rhonchi. Breathing is unlabored. Heart:  Irregularly irregular, with S1 S2. No murmurs, rubs, or gallops appreciated. Abdomen: Soft, non-tender, distended with normoactive bowel sounds. No hepatomegaly. No rebound/guarding. No obvious abdominal masses. Msk:  Strength and tone appear normal for age. Extremities: No clubbing or cyanosis. Trace pretibial edema. Distal pedal pulses are 2+ and equal bilaterally. Neuro: Alert and oriented X 3. No facial asymmetry. No focal deficit. Moves all extremities spontaneously. Psych:  Responds to questions appropriately with a normal affect.    Assessment and Plan:  Principal Problem:   Acute respiratory distress Active Problems:   Persistent atrial fibrillation (HCC)   Elevated troponin   Hyperthyroidism   Noncompliance with medications    Hypertension    1. Acute respiratory distress: -He does not appear grossly volume overloaded though does have a mildly distended abdomen and mildly elevated JVD. There is no lower extremity swelling -He is noted to be in persistent Afib with intermittent RVR, this dates back to at least late December 2017 when he was also noted to be in rate controlled Afib in the [facility] ED -He certainly may have cardiomyopathy, possibly tachycardia mediated though cannot rule out ischemia either given his past medical history, noncompliance, hypertension, tobacco abuse, obesity -Nitropatch was applied in the ED, now with hypotension, will discontinue  2. Persistent atrial fibrillation with intermittent RVR: -Currently rate controlled in the 90s beats per minute -It appears he has been in this rhythm since at least late December 2017 when reviewing prior EKG. Given he has been in this rhythm for at least approximately 2 months would aim to rate control as he has not been adequately anticoagulated at this time -Agree with addition of low-dose metoprolol twice a day and titrate as needed for optimal rate control -Agree with heparin drip for anticoagulation at this time until further cardiac evaluation is performed to evaluate his LV systolic function -CHADS2VASc at least 2 (HTN, age 77) -He will need long-term, full dose anticoagulation with a DOAC prior to discharge, if there are financial hardship perhaps Coumadin would be a better option for him. This can be discussed as he progresses throughout his admission -Perhaps his precipitating event is his uncontrolled hyperthyroidism for which she has been restarted on methimazole -Will check magnesium level and replete to goal of 2.0 as indicated -Will give 20 mEq of potassium chloride to obtain a goal potassium of 4.0  3. Elevated troponin: -Troponin mildly elevated at 0.05 in the ER -Patient without chest pain -Continue to cycle  troponin and monitor for operative treatment -On heparin drip as above his persistent atrial fibrillation -Check echo to evaluate LV systolic function and wall motion -Would benefit from an ischemic evaluation, perhaps this could be performed as an outpatient if his troponin does not turn significantly upwards -Hold on aspirin at this time given need for DOAC prior to discharge -Metoprolol as above  4. Hyperthyroidism: -Restarted on methimazole per IM -Will be scheduled for outpatient endocrinology visit per IM  5. Medication noncompliance presenting significant hazard to health: -Compliance is advised -Perhaps case manager is needed to assist with patient obtaining medications prior to discharge, defer to IM  6. Hypertension: -Blood pressure has been slightly soft in the ER in the 90s systolic range -Will discontinue Nitropatch to allow for more BP room to titrate metoprolol as needed -Given this will  hold lisinopril at this time to allow for further titration of metoprolol for added rate control  7. Polysubstance abuse: -Cessation of tobacco and alcohol is advised -Denies illegal substances   Signed, Eula Listen, PA-C [facility] Pager: [phone] 07/28/2016, 2:10 PM

## 2016-07-28 NOTE — ED Notes (Signed)
Patient transported to CT 

## 2016-07-28 NOTE — H&P (Signed)
[facility] [place] [facility]   PATIENT NAME: Eugene Henderson    MR#:  [id number]  DATE OF BIRTH:  July 21, 1950  DATE OF ADMISSION:  07/28/2016  PRIMARY CARE PHYSICIAN: No PCP Per Patient   REQUESTING/REFERRING PHYSICIAN: Dr. Daryel November  CHIEF COMPLAINT:   Chief Complaint  Patient presents with  . Shortness of Breath    HISTORY OF PRESENT ILLNESS:  Eugene Henderson  is a 66 y.o. male with a known history of Hypertension, hyperthyroidism, osteoarthritis who presents to the hospital due to shortness of breath. Patient says that he has been short of breath now for the past few weeks intermittently. Shortness of breath is usually worse with exertion but sometimes it occurs at rest. He denies any associated chest pain, nausea, vomiting, palpitation dizziness or syncope. This morning he was having significant shortness of breath on minimal exertion therefore came to the ER for further evaluation. Patient says that he has not been able to afford her take any of his scheduled medications for over a month. Patient was noted to have a mildly elevated troponin, also noted to be in atrial flutter/fibrillation and hospitalist services were contacted further treatment and evaluation.  PAST MEDICAL HISTORY:   Past Medical History:  Diagnosis Date  . Arthritis   . Asthma   . Hypertension   . Hyperthyroidism     PAST SURGICAL HISTORY:   Past Surgical History:  Procedure Laterality Date  . JOINT REPLACEMENT      SOCIAL HISTORY:   Social History  Substance Use Topics  . Smoking status: Current Some Day Smoker    Packs/day: 0.25    Years: 25.00    Types: Cigarettes  . Smokeless tobacco: Never Used  . Alcohol use Yes     Comment: Socially - once a month    FAMILY HISTORY:   Family History  Problem Relation Age of Onset  . Diabetes Mother   . Diabetes Father   . Diabetes Brother     DRUG ALLERGIES:  No Known Allergies  REVIEW OF SYSTEMS:   Review of  Systems  Constitutional: Negative for fever and weight loss.  HENT: Negative for congestion, nosebleeds and tinnitus.   Eyes: Negative for blurred vision, double vision and redness.  Respiratory: Positive for shortness of breath. Negative for cough and hemoptysis.   Cardiovascular: Negative for chest pain, orthopnea, leg swelling and PND.  Gastrointestinal: Negative for abdominal pain, diarrhea, melena, nausea and vomiting.  Genitourinary: Negative for dysuria, hematuria and urgency.  Musculoskeletal: Negative for falls and joint pain.  Neurological: Negative for dizziness, tingling, sensory change, focal weakness, seizures, weakness and headaches.  Endo/Heme/Allergies: Negative for polydipsia. Does not bruise/bleed easily.  Psychiatric/Behavioral: Negative for depression and memory loss. The patient is not nervous/anxious.     MEDICATIONS AT HOME:   Prior to Admission medications   Medication Sig Start Date End Date Taking? Authorizing Provider  furosemide (LASIX) 40 MG tablet Take 40 mg by mouth 2 (two) times daily.   Yes Historical Provider, MD  naproxen sodium (ANAPROX) 220 MG tablet Take 220 mg by mouth 2 (two) times daily with a meal.   Yes Historical Provider, MD  digoxin (LANOXIN) 0.125 MG tablet Take 1 tablet (0.125 mg total) by mouth daily. Patient not taking: Reported on 07/28/2016 06/03/16   Charlynne Pander, MD  lisinopril (PRINIVIL,ZESTRIL) 20 MG tablet Take 1 tablet (20 mg total) by mouth daily. Patient not taking: Reported on 07/28/2016 06/03/16   Charlynne Pander, MD  methimazole (TAPAZOLE) 10 MG tablet Take 2 tablets (20 mg total) by mouth 3 (three) times daily. Patient not taking: Reported on 07/28/2016 06/03/16   Charlynne Pander, MD  naproxen sodium (ANAPROX) 550 MG tablet Take 1 tablet (550 mg total) by mouth 2 (two) times daily as needed. Patient not taking: Reported on 07/28/2016 06/03/16   Charlynne Pander, MD  propylthiouracil (PTU) 50 MG tablet Take 1 tablet (50  mg total) by mouth every 8 (eight) hours. Patient not taking: Reported on 07/28/2016 06/03/16   Charlynne Pander, MD  traMADol (ULTRAM) 50 MG tablet Take 1 tablet (50 mg total) by mouth every 6 (six) hours as needed. Patient not taking: Reported on 07/28/2016 06/03/16 06/03/17  Charlynne Pander, MD      VITAL SIGNS:  Blood pressure 120/88, pulse 94, temperature 98.1 F (36.7 C), resp. rate (!) 35, height 6' (1.829 m), weight 131.5 kg (290 lb), SpO2 98 %.  PHYSICAL EXAMINATION:  Physical Exam  GENERAL:  66 y.o.-year-old patient lying in the bed in no acute distress.  EYES: Pupils equal, round, reactive to light and accommodation. No scleral icterus. Extraocular muscles intact.  HEENT: Head atraumatic, normocephalic. Oropharynx and nasopharynx clear. No oropharyngeal erythema, moist oral mucosa  NECK:  Supple, no jugular venous distention. No thyroid enlargement, no tenderness.  LUNGS: Normal breath sounds bilaterally, no wheezing, rales, rhonchi. No use of accessory muscles of respiration.  CARDIOVASCULAR: S1, S2 RRR. No murmurs, rubs, gallops, clicks.  ABDOMEN: Soft, nontender, nondistended. Bowel sounds present. No organomegaly or mass.  EXTREMITIES: No pedal edema, cyanosis, or clubbing. + 2 pedal & radial pulses b/l.   NEUROLOGIC: Cranial nerves II through XII are intact. No focal Motor or sensory deficits appreciated b/l PSYCHIATRIC: The patient is alert and oriented x 3.   SKIN: No obvious rash, lesion, or ulcer.   LABORATORY PANEL:   CBC  Recent Labs Lab 07/28/16 0901  WBC 5.0  HGB 14.3  HCT 42.2  PLT 159   ------------------------------------------------------------------------------------------------------------------  Chemistries   Recent Labs Lab 07/28/16 0901  NA 137  K 3.8  CL 102  CO2 28  GLUCOSE 133*  BUN 13  CREATININE 0.77  CALCIUM 9.0    ------------------------------------------------------------------------------------------------------------------  Cardiac Enzymes  Recent Labs Lab 07/28/16 0901  TROPONINI 0.05*   ------------------------------------------------------------------------------------------------------------------  RADIOLOGY:  Dg Chest 2 View  Result Date: 07/28/2016 CLINICAL DATA:  Shortness of breath with cardiac palpitations EXAM: CHEST  2 VIEW COMPARISON:  June 03, 2016 chest radiograph and chest CT June 03, 2016 FINDINGS: There is no edema or consolidation. Heart is enlarged, stable. The pulmonary vascularity is normal. There is mild degenerative change in the thoracic spine. There is atherosclerotic calcification in the left carotid artery. IMPRESSION: No edema or consolidation. Stable cardiomegaly. There is left carotid artery atherosclerosis. Electronically Signed   By: Bretta Bang III M.D.   On: 07/28/2016 08:32   Ct Angio Chest Pe W And/or Wo Contrast  Result Date: 07/28/2016 CLINICAL DATA:  Dyspnea. EXAM: CT ANGIOGRAPHY CHEST WITH CONTRAST TECHNIQUE: Multidetector CT imaging of the chest was performed using the standard protocol during bolus administration of intravenous contrast. Multiplanar CT image reconstructions and MIPs were obtained to evaluate the vascular anatomy. CONTRAST:  75 mL Isovue 370 COMPARISON:  06/03/2016 chest CTA FINDINGS: Cardiovascular: Pulmonary arterial opacification is satisfactory without evidence of lobar or more central emboli. No definite segmental emboli are identified, however evaluation is limited by motion artifact. The heart is enlarged. LAD coronary artery calcification  is noted. There is thoracic aortic atherosclerosis without aneurysm. No pericardial effusion. Mediastinum/Nodes: Scattered calcified mediastinal and hilar lymph nodes are noted. Borderline enlarged noncalcified mediastinal lymph nodes are unchanged, with the largest measuring 11 mm in  short axis in the lower right paratracheal station. The esophagus, thyroid, and trachea are unremarkable. Lungs/Pleura: No pleural effusion or pneumothorax. There is a 3 mm subpleural right lower lobe nodule adjacent to the major fissure which was not clearly present on the prior CT (series 5, image 65). No evidence of pneumonia or edema. Upper Abdomen: No acute abnormality. Musculoskeletal: Thoracic spondylosis. Review of the MIP images confirms the above findings. IMPRESSION: 1. No evidence of central pulmonary emboli. Segmental assessment limited by motion. 2. 3 mm right lower lobe lung nodule. No follow-up needed if patient is low-risk. Non-contrast chest CT can be considered in 12 months if patient is high-risk. This recommendation follows the consensus statement: Guidelines for Management of Incidental Pulmonary Nodules Detected on CT Images: From the Fleischner Society 2017; Radiology 2017; 284:228-243. 3. Cardiomegaly and aortic atherosclerosis. Electronically Signed   By: Sebastian Ache M.D.   On: 07/28/2016 10:16     IMPRESSION AND PLAN:   66 year old male with past medical history of hypertension, Osteoarthritis, hyperthyroidism who presented to the hospital due to shortness of breath.  1. Shortness of breath-etiology unclear presently. Clinically patient does not appear to be in congestive heart failure. His CT chest is negative for any acute pulmonary embolism. -He is noted to be in atrial flutter/fibrillation. I suspect her shortness of breath could be related to that or possible underlying cardiomyopathy as he is noncompliant with his medications. -I will gently diurese him with IV Lasix, follow I's and O's and daily weights. -Check a two-dimensional echocardiogram, get a cardiology consult.  2. Elevated troponin-likely supply demand ischemia secondary to atrial fibrillation/flutter.  -I will cycle his cardiac markers, continue heparin, check echocardiogram, get a cardiology consult.  3.  Hyperthyroidism-patient's TSH is significantly low.  - pt. Is non-compliant with his meds.  Will resume Methimazole.  - will need outpatient Endocrine follow up.   4. New Onset A. Fib/flutter - rate controlled.  I will start some Low dose Metoprolol.  - Will keep on tele, Heparin gtt, Cards consult, Echo.   5. HTN - cont. Lisinopril and will add some Low dose Metoprolol.   All the records are reviewed and case discussed with ED provider. Management plans discussed with the patient, family and they are in agreement.  CODE STATUS: Full code  TOTAL TIME TAKING CARE OF THIS PATIENT: 45 minutes.    Houston Siren M.D on 07/28/2016 at 11:15 AM  Between 7am to 6pm - Pager - [phone]  After 6pm go to [email or web address] - password [email or web address] [facility]  [facility]  Office  [phone]  CC: Primary care physician; No PCP Per Patient

## 2016-07-29 ENCOUNTER — Inpatient Hospital Stay (HOSPITAL_COMMUNITY)
Admit: 2016-07-29 | Discharge: 2016-07-29 | Disposition: A | Discharge status: Home / Self Care | Payer: Medicare (Managed Care) | Attending: Specialist | Admitting: Specialist

## 2016-07-29 DIAGNOSIS — R0600 Dyspnea, unspecified: Secondary | ICD-10-CM

## 2016-07-29 DIAGNOSIS — R748 Abnormal levels of other serum enzymes: Secondary | ICD-10-CM

## 2016-07-29 LAB — BASIC METABOLIC PANEL
ANION GAP: 8 (ref 5–15)
BUN: 21 mg/dL — ABNORMAL HIGH (ref 6–20)
CALCIUM: 8.9 mg/dL (ref 8.9–10.3)
CO2: 27 mmol/L (ref 22–32)
Chloride: 102 mmol/L (ref 101–111)
Creatinine, Ser: 0.83 mg/dL (ref 0.61–1.24)
GLUCOSE: 124 mg/dL — AB (ref 65–99)
Potassium: 4 mmol/L (ref 3.5–5.1)
SODIUM: 137 mmol/L (ref 135–145)

## 2016-07-29 LAB — HEPARIN LEVEL (UNFRACTIONATED)
Heparin Unfractionated: 0.43 IU/mL (ref 0.30–0.70)
Heparin Unfractionated: 0.34 IU/mL (ref 0.30–0.70)
Heparin Unfractionated: 1.04 IU/mL — ABNORMAL HIGH (ref 0.30–0.70)

## 2016-07-29 LAB — CBC
HCT: 43.9 % (ref 40.0–52.0)
HEMOGLOBIN: 14.2 g/dL (ref 13.0–18.0)
MCH: 26.7 pg (ref 26.0–34.0)
MCHC: 32.3 g/dL (ref 32.0–36.0)
MCV: 82.6 fL (ref 80.0–100.0)
Platelets: 158 10*3/uL (ref 150–440)
RBC: 5.32 MIL/uL (ref 4.40–5.90)
RDW: 15.6 % — ABNORMAL HIGH (ref 11.5–14.5)
WBC: 5.4 10*3/uL (ref 3.8–10.6)

## 2016-07-29 LAB — ECHOCARDIOGRAM COMPLETE
HEIGHTINCHES: 72 in
Weight: 4195.2 oz

## 2016-07-29 MED ORDER — HEPARIN (PORCINE) IN NACL 100-0.45 UNIT/ML-% IJ SOLN
1900.0000 [IU]/h | INTRAMUSCULAR | Status: DC
  Administered 2016-07-29: 1450 [IU]/h via INTRAVENOUS
  Administered 2016-07-31 – 2016-08-01 (×3): 1900 [IU]/h via INTRAVENOUS
  Filled 2016-07-29 (×4): qty 250

## 2016-07-29 MED ORDER — METOPROLOL TARTRATE 50 MG PO TABS
50.0000 mg | ORAL_TABLET | Freq: Two times a day (BID) | ORAL | Status: DC
  Administered 2016-07-29 (×2): 50 mg via ORAL
  Filled 2016-07-29 (×2): qty 1

## 2016-07-29 MED ORDER — MAGNESIUM OXIDE 400 (241.3 MG) MG PO TABS
400.0000 mg | ORAL_TABLET | Freq: Every day | ORAL | Status: DC
  Administered 2016-07-29 – 2016-08-04 (×7): 400 mg via ORAL
  Filled 2016-07-29 (×7): qty 1

## 2016-07-29 MED ORDER — LORAZEPAM 2 MG/ML IJ SOLN
0.5000 mg | INTRAMUSCULAR | Status: DC | PRN
  Administered 2016-07-29 – 2016-08-01 (×7): 0.5 mg via INTRAVENOUS
  Filled 2016-07-29 (×7): qty 1

## 2016-07-29 MED ORDER — SODIUM CHLORIDE 0.9 % IJ SOLN
INTRAMUSCULAR | Status: AC
  Administered 2016-07-29: 11:00:00
  Filled 2016-07-29: qty 10

## 2016-07-29 MED ORDER — FUROSEMIDE 10 MG/ML IJ SOLN
40.0000 mg | Freq: Two times a day (BID) | INTRAMUSCULAR | Status: DC
  Administered 2016-07-29 – 2016-07-30 (×2): 40 mg via INTRAVENOUS
  Filled 2016-07-29 (×2): qty 4

## 2016-07-29 NOTE — Progress Notes (Addendum)
RN notified by CCMD that patient had 7 beat run of VTACH.  When nursing arrived in room, patient stated he felt like he was about to pass out.  He said he was very short of breath.  2LNC put on patient.  All other VS are stable.  Patient alert and oriented but resting in bed.    Dr. Elisabeth Pigeon paged.   Dr.  Kirke Corin on the floor.  Informed him of the "event".  He is going to adjust medications on patient based on recent ECHO findings.

## 2016-07-29 NOTE — Progress Notes (Signed)
*  PRELIMINARY RESULTS* Echocardiogram 2D Echocardiogram has been performed.  Cristela Blue 07/29/2016, 2:55 PM

## 2016-07-29 NOTE — Care Management Note (Addendum)
Case Management Note  Patient Details  Name: Eugene Henderson MRN: [id number] Date of Birth: May 23, 1951  Subjective/Objective:          Recently moved to [place] from [place].  Patient has no pcp and has not been taking medications.  Patient rambles in responses to questions and difficult to get specific answer.  Gave CM permission to cal his daughter Eugene Henderson.  Patient moved from [place] in August.  Has Medicare coverage and part D.   Was seen in the ED in December and given a list of physicians that are accepting new patients.  Says "everything was so far out could not get an appointment so patient ran out of his meds."  Gave CM permission to schedude appointment.  Scheduled one with Desma Maxim at [facility] for March 2 1pm .  Eugene Henderson says that there are no transportation issues.  It is reported to CM be care team that  anticoagulation therapy choice will be based on patient transporation limitations.  Eugene Henderson says that patient is able to obtain his medications, is currently having medicaid transferred to [facility], gets some of patient's meds over nighted from [place] if the drug store is out of network and again says there are absolutely no transportation issues.    Discussed with Eugene Henderson of urgent need to have the medicaid transferred and pcp identified. She assures CM this is already in progress.   CM is not convinced that all of the information being provided by Eugene Henderson is accurate.   Action/Plan: would benefit from home health SN/SW  Expected Discharge Date:                  Expected Discharge Plan:     In-House Referral:     Discharge planning Services     Post Acute Care Choice:    Choice offered to:     DME Arranged:    DME Agency:     HH Arranged:    HH Agency:     Status of Service:     If discussed at [employer] of Stay Meetings, dates discussed:    Additional Comments:  Eugene Hong, RN 07/29/2016, 8:54 AM

## 2016-07-29 NOTE — Progress Notes (Signed)
ANTICOAGULATION CONSULT NOTE - Initial Consult  Pharmacy Consult for Heparin drip Indication: atrial fibrillation  No Known Allergies  Patient Measurements: Height: 6' (182.9 cm) Weight: 290 lb (131.5 kg) IBW/kg (Calculated) : 77.6 Heparin Dosing Weight: 107.4 kg  Vital Signs: Temp: 98.1 F (36.7 C) (02/21 0749) BP: 138/108 (02/21 0800) Pulse Rate: 90 (02/21 0800)  Labs:  Recent Labs (last 2 labs)    Recent Labs  07/28/16 0901  HGB 14.3  HCT 42.2  PLT 159  APTT 25  LABPROT 13.9  INR 1.07  CREATININE 0.77  TROPONINI 0.05*      Estimated Creatinine Clearance: 129.2 mL/min (by C-G formula based on SCr of 0.77 mg/dL).   Medical History:     Past Medical History:  Diagnosis Date  . Arthritis   . Asthma   . Hypertension       Assessment: 66 yo male starting on heparin drip for AFib. Pt does not appear to be on anticoagulation as an outpt.   Hgb 14.3, Plt 159, INR 1.07, aPTT 25  Goal of Therapy:  Heparin level 0.3-0.7 units/ml Monitor platelets by anticoagulation protocol: Yes   Plan:  Heparin bolus 4000 units IV x1 then heparin drip at 1500 units/hr (=15 ml/hr) Heparin level 6h after start of drip CBC in AM  2/21 @ 1700 HL 0.27 -- will give 1600 unit bolus x 1 (16 unit/kg based on heparin Dw) then will increase infusion to 1700 units/hr and will recheck HL 2/22 @ 0200 (6 hours after start of new infusion).  2/22 02:00 heparin level 0.43. Continue current regimen and recheck in 6 hours to confirm.  2/22 0800 HL =0.34 therapeutic. This is the second therapeutic level however level has been declining 0.43>0.34 therefore I will recheck in 6 hours.  2/22 1400 HL= 1.04 Spoke with RN who states drip is running at 25ml/hr and pt reporting now signs of bleeding. Pt was called to confirm where the lab was drawn. initially he stated it was drawn from his left hand (IV in left forearm), but then stated it was his right. Asked if it was the  opposite of where the IV was and he stated yes. RN informed to hold heparin drip for 1 hour and resume at 1450 units/hr. Recheck in 6 hours.  Thank you for this consult.  Olene Floss, Pharm.D, BCPS Clinical Pharmacist  07/29/16    07/29/2016

## 2016-07-29 NOTE — Progress Notes (Signed)
ANTICOAGULATION CONSULT NOTE - Initial Consult  Pharmacy Consult for Heparin drip Indication: atrial fibrillation  No Known Allergies  Patient Measurements: Height: 6' (182.9 cm) Weight: 290 lb (131.5 kg) IBW/kg (Calculated) : 77.6 Heparin Dosing Weight: 107.4 kg  Vital Signs: Temp: 98.1 F (36.7 C) (02/21 0749) BP: 138/108 (02/21 0800) Pulse Rate: 90 (02/21 0800)  Labs:  Recent Labs (last 2 labs)    Recent Labs  07/28/16 0901  HGB 14.3  HCT 42.2  PLT 159  APTT 25  LABPROT 13.9  INR 1.07  CREATININE 0.77  TROPONINI 0.05*      Estimated Creatinine Clearance: 129.2 mL/min (by C-G formula based on SCr of 0.77 mg/dL).   Medical History:     Past Medical History:  Diagnosis Date  . Arthritis   . Asthma   . Hypertension       Assessment: 66 yo male starting on heparin drip for AFib. Pt does not appear to be on anticoagulation as an outpt.   Hgb 14.3, Plt 159, INR 1.07, aPTT 25  Goal of Therapy:  Heparin level 0.3-0.7 units/ml Monitor platelets by anticoagulation protocol: Yes   Plan:  Heparin bolus 4000 units IV x1 then heparin drip at 1500 units/hr (=15 ml/hr) Heparin level 6h after start of drip CBC in AM  2/21 @ 1700 HL 0.27 -- will give 1600 unit bolus x 1 (16 unit/kg based on heparin Dw) then will increase infusion to 1700 units/hr and will recheck HL 2/22 @ 0200 (6 hours after start of new infusion).  2/22 02:00 heparin level 0.43. Continue current regimen and recheck in 6 hours to confirm.  2/22 0800 HL =0.34 therapeutic. This is the second therapeutic level however level has been declining 0.43>0.34 therefore I will recheck in 6 hours.  Thank you for this consult.  Olene Floss, Pharm.D, BCPS Clinical Pharmacist  07/29/16    07/29/2016

## 2016-07-29 NOTE — Progress Notes (Signed)
Patient Name: Eugene Henderson Date of Encounter: 07/29/2016  Primary Cardiologist: New to [facility] - consult by [facility] Problem List     Principal Problem:   Acute respiratory distress Active Problems:   Persistent atrial fibrillation (HCC)   Elevated troponin   Hyperthyroidism   Noncompliance with medications   Essential hypertension   Shortness of breath   Acute on chronic diastolic CHF (congestive heart failure) (HCC)     Subjective   Episode of acute SOB overnight. Given Ativan with good response. Vitals were stable with adequate oxygen saturations. Voiding helped as well. No SOB this morning. Reports good UOP, uncertain of validity of I's & O's at this time. Weight 262 this morning (admission weight of 290), uncertain of validity of admission weight. Remains in Afib with heart rates ranging from 110's to 140's bpm overnight. CBC stable. SCr stable. K+ stable. Remains on heparin gtt. Echo pending.   Inpatient Medications    Scheduled Meds: . furosemide  20 mg Intravenous Q12H  . methimazole  20 mg Oral TID  . metoprolol tartrate  50 mg Oral BID  . potassium chloride  20 mEq Oral Once  . sodium chloride flush  3 mL Intravenous Q12H   Continuous Infusions: . heparin 1,700 Units/hr (07/29/16 0011)   PRN Meds: acetaminophen **OR** acetaminophen, LORazepam, ondansetron **OR** ondansetron (ZOFRAN) IV, traMADol   Vital Signs    Vitals:   07/28/16 1933 07/28/16 2332 07/29/16 0609 07/29/16 0612  BP: (!) 136/97 131/78 (!) 126/102 124/83  Pulse: 89 92 (!) 53 (!) 119  Resp: 16  15   Temp: 97.6 F (36.4 C)  97.8 F (36.6 C)   TempSrc: Oral  Oral   SpO2: 95% 97% 100% 97%  Weight:    262 lb 3.2 oz (118.9 kg)  Height:        Intake/Output Summary (Last 24 hours) at 07/29/16 0805 Last data filed at 07/29/16 0801  Gross per 24 hour  Intake              270 ml  Output              800 ml  Net             -530 ml   Filed Weights   07/28/16 0751 07/29/16 0612    Weight: 290 lb (131.5 kg) 262 lb 3.2 oz (118.9 kg)    Physical Exam    GEN: Well nourished, well developed, in no acute distress.  HEENT: Grossly normal.  Neck: Supple, JVD remains elevated ~ 10 cm, no carotid bruits, or masses. Cardiac: Tachycardic, irregularly irregular, no murmurs, rubs, or gallops. No clubbing, cyanosis, edema.  Radials/DP/PT 2+ and equal bilaterally.  Respiratory:  Improving breath sounds bilaterally with continued faint bibasilar crackles. GI: Soft, nontender, distended, BS + x 4. MS: no deformity or atrophy. Skin: warm and dry, no rash. Neuro:  Strength and sensation are intact. Psych: AAOx3.  Normal affect.  Labs    CBC  Recent Labs  07/28/16 0901 07/29/16 0200  WBC 5.0 5.4  NEUTROABS 2.8  --   HGB 14.3 14.2  HCT 42.2 43.9  MCV 81.1 82.6  PLT 159 158   Basic Metabolic Panel  Recent Labs  07/28/16 0901 07/28/16 1652 07/29/16 0200  NA 137  --  137  K 3.8  --  4.0  CL 102  --  102  CO2 28  --  27  GLUCOSE 133*  --  124*  BUN 13  --  21*  CREATININE 0.77  --  0.83  CALCIUM 9.0  --  8.9  MG  --  1.7  --    Liver Function Tests No results for input(s): AST, ALT, ALKPHOS, BILITOT, PROT, ALBUMIN in the last 72 hours. No results for input(s): LIPASE, AMYLASE in the last 72 hours. Cardiac Enzymes  Recent Labs  07/28/16 1435 07/28/16 1652 07/28/16 2155  TROPONINI 0.04* 0.04* 0.04*   BNP Invalid input(s): POCBNP D-Dimer No results for input(s): DDIMER in the last 72 hours. Hemoglobin A1C No results for input(s): HGBA1C in the last 72 hours. Fasting Lipid Panel No results for input(s): CHOL, HDL, LDLCALC, TRIG, CHOLHDL, LDLDIRECT in the last 72 hours. Thyroid Function Tests No results for input(s): TSH, T4TOTAL, T3FREE, THYROIDAB in the last 72 hours.  Invalid input(s): FREET3  Telemetry    Afib with RVR, 110's currently, rates into the 120-140 bpm overnight, rare PVC - Personally Reviewed  ECG    n/a - Personally  Reviewed  Radiology    Dg Chest 2 View  Result Date: 07/28/2016 CLINICAL DATA:  Shortness of breath with cardiac palpitations EXAM: CHEST  2 VIEW COMPARISON:  June 03, 2016 chest radiograph and chest CT June 03, 2016 FINDINGS: There is no edema or consolidation. Heart is enlarged, stable. The pulmonary vascularity is normal. There is mild degenerative change in the thoracic spine. There is atherosclerotic calcification in the left carotid artery. IMPRESSION: No edema or consolidation. Stable cardiomegaly. There is left carotid artery atherosclerosis. Electronically Signed   By: Bretta Bang III M.D.   On: 07/28/2016 08:32   Ct Angio Chest Pe W And/or Wo Contrast  Result Date: 07/28/2016 CLINICAL DATA:  Dyspnea. EXAM: CT ANGIOGRAPHY CHEST WITH CONTRAST TECHNIQUE: Multidetector CT imaging of the chest was performed using the standard protocol during bolus administration of intravenous contrast. Multiplanar CT image reconstructions and MIPs were obtained to evaluate the vascular anatomy. CONTRAST:  75 mL Isovue 370 COMPARISON:  06/03/2016 chest CTA FINDINGS: Cardiovascular: Pulmonary arterial opacification is satisfactory without evidence of lobar or more central emboli. No definite segmental emboli are identified, however evaluation is limited by motion artifact. The heart is enlarged. LAD coronary artery calcification is noted. There is thoracic aortic atherosclerosis without aneurysm. No pericardial effusion. Mediastinum/Nodes: Scattered calcified mediastinal and hilar lymph nodes are noted. Borderline enlarged noncalcified mediastinal lymph nodes are unchanged, with the largest measuring 11 mm in short axis in the lower right paratracheal station. The esophagus, thyroid, and trachea are unremarkable. Lungs/Pleura: No pleural effusion or pneumothorax. There is a 3 mm subpleural right lower lobe nodule adjacent to the major fissure which was not clearly present on the prior CT (series 5, image  65). No evidence of pneumonia or edema. Upper Abdomen: No acute abnormality. Musculoskeletal: Thoracic spondylosis. Review of the MIP images confirms the above findings. IMPRESSION: 1. No evidence of central pulmonary emboli. Segmental assessment limited by motion. 2. 3 mm right lower lobe lung nodule. No follow-up needed if patient is low-risk. Non-contrast chest CT can be considered in 12 months if patient is high-risk. This recommendation follows the consensus statement: Guidelines for Management of Incidental Pulmonary Nodules Detected on CT Images: From the Fleischner Society 2017; Radiology 2017; 284:228-243. 3. Cardiomegaly and aortic atherosclerosis. Electronically Signed   By: Sebastian Ache M.D.   On: 07/28/2016 10:16    Cardiac Studies   TTE pending  Patient Profile     66 y.o. male with history of hyperthyroidism previously on  methimazole though not taking for approximately one month, persistent atrial fibrillation not on full dose anticoagulation as an outpatient, hypertension, ongoing tobacco abuse, alcohol abuse, and obesity who presented to [facility] ED today with increased shortness of breath for approximately 6-8 weeks and was noted to be in persistent Afib with intermittent RVR.  Assessment & Plan    1. Acute respiratory distress: -He remains volume overloaded at this time with bibasilar crackles and elevated JVP -He is noted to be in persistent Afib with intermittent RVR, this dates back to at least late December 2017 when he was also noted to be in rate controlled Afib in the [facility] ED -He certainly may have cardiomyopathy, possibly tachycardia mediated, though cannot rule out ischemia either given his past medical history, noncompliance, hypertension, tobacco abuse, obesity -BP improved with discontinuation of nitro patch  2. Persistent atrial fibrillation with intermittent RVR: -Currently, heart rates in the 110's with episodes into the 140's  overnight -It appears he has been in this rhythm since at least late December 2017 when reviewing prior EKG. Given he has been in this rhythm for at least approximately 2 months would aim to rate control as he has not been adequately anticoagulated at this time -Titrate Lopressor to 50 mg bid fora dded rate control -Some of his tachycardic rates may be 2/2 anxiety overnight -Continue heparin drip for anticoagulation at this time until further cardiac evaluation is performed to evaluate his LV systolic function -CHADS2VASc at least 3 (CHF, HTN, age 80) -He will need long-term, full dose anticoagulation possibly with a DOAC prior to discharge, if there are financial hardship perhaps Coumadin would be a better option for him. This can be discussed as he progresses throughout his admission -Perhaps his precipitating event is his uncontrolled hyperthyroidism for which she has been restarted on methimazole -Replete magnesium to goal of 2.0 -Potassium repleted to goal of 4.0  3. Elevated troponin: -Troponin mildly elevated at 0.05 in the ER -Patient without chest pain -Troponin mildly elevated and flat trending at 0.04 x 3 since admission -On heparin drip as above his persistent atrial fibrillation -Check echo to evaluate LV systolic function and wall motion -Would benefit from an ischemic evaluation, perhaps this could be performed as an outpatient if his troponin does not turn significantly upwards -Hold on aspirin at this time given need for DOAC prior to discharge -Metoprolol as above  4. Hyperthyroidism: -Restarted on methimazole per IM -Will be scheduled for outpatient endocrinology visit per IM  5. Medication noncompliance presenting significant hazard to health: -Compliance is advised -Perhaps case manager is needed to assist with patient obtaining medications prior to discharge, defer to IM  6. Hypertension: -Stable -Metoprolol as above  7. Polysubstance abuse: -Cessation of  tobacco and alcohol is advised -Denies illegal substances  8. Anxiety: -Improved with Ativan -Asking for Ativan frequently -Per IM -May benefit from standing therapy    Signed, Carola Frost [facility] Pager: [phone] 07/29/2016, 8:05 AM

## 2016-07-29 NOTE — Progress Notes (Signed)
[facility] [place] [facility]   PATIENT NAME: Eugene Henderson    MR#:  [id number]  DATE OF BIRTH:  18-Feb-1951  SUBJECTIVE:  CHIEF COMPLAINT:   Chief Complaint  Patient presents with  . Shortness of Breath    Came with SOB, having Fluid overload, CHF- with a fib and RVR.   On Heparin drip, and metoprolol for HR control.   Cardio following. Feels little better.  REVIEW OF SYSTEMS:  CONSTITUTIONAL: No fever, fatigue or weakness.  EYES: No blurred or double vision.  EARS, NOSE, AND THROAT: No tinnitus or ear pain.  RESPIRATORY: No cough, positive for shortness of breath, wheezing or hemoptysis.  CARDIOVASCULAR: No chest pain, orthopnea, edema.  GASTROINTESTINAL: No nausea, vomiting, diarrhea or abdominal pain.  GENITOURINARY: No dysuria, hematuria.  ENDOCRINE: No polyuria, nocturia,  HEMATOLOGY: No anemia, easy bruising or bleeding SKIN: No rash or lesion. MUSCULOSKELETAL: No joint pain or arthritis.   NEUROLOGIC: No tingling, numbness, weakness.  PSYCHIATRY: No anxiety or depression.   ROS  DRUG ALLERGIES:  No Known Allergies  VITALS:  Blood pressure 139/75, pulse 69, temperature 97.6 F (36.4 C), temperature source Oral, resp. rate 20, height 6' (1.829 m), weight 118.9 kg (262 lb 3.2 oz), SpO2 99 %.  PHYSICAL EXAMINATION:  GENERAL:  66 y.o.-year-old patient lying in the bed with no acute distress.  EYES: Pupils equal, round, reactive to light and accommodation. No scleral icterus. Extraocular muscles intact.  HEENT: Head atraumatic, normocephalic. Oropharynx and nasopharynx clear.  NECK:  Supple, no jugular venous distention. No thyroid enlargement, no tenderness.  LUNGS: Normal breath sounds bilaterally, no wheezing, some crepitation. No use of accessory muscles of respiration.  CARDIOVASCULAR: S1, S2 fast. No murmurs, rubs, or gallops.  ABDOMEN: Soft, nontender, nondistended. Bowel sounds present. No organomegaly or mass.  EXTREMITIES: No pedal  edema, cyanosis, or clubbing.  NEUROLOGIC: Cranial nerves II through XII are intact. Muscle strength 5/5 in all extremities. Sensation intact. Gait not checked.  PSYCHIATRIC: The patient is alert and oriented x 3.  SKIN: No obvious rash, lesion, or ulcer.   Physical Exam LABORATORY PANEL:   CBC  Recent Labs Lab 07/29/16 0200  WBC 5.4  HGB 14.2  HCT 43.9  PLT 158   ------------------------------------------------------------------------------------------------------------------  Chemistries   Recent Labs Lab 07/28/16 1652 07/29/16 0200  NA  --  137  K  --  4.0  CL  --  102  CO2  --  27  GLUCOSE  --  124*  BUN  --  21*  CREATININE  --  0.83  CALCIUM  --  8.9  MG 1.7  --    ------------------------------------------------------------------------------------------------------------------  Cardiac Enzymes  Recent Labs Lab 07/28/16 1652 07/28/16 2155  TROPONINI 0.04* 0.04*   ------------------------------------------------------------------------------------------------------------------  RADIOLOGY:  Dg Chest 2 View  Result Date: 07/28/2016 CLINICAL DATA:  Shortness of breath with cardiac palpitations EXAM: CHEST  2 VIEW COMPARISON:  June 03, 2016 chest radiograph and chest CT June 03, 2016 FINDINGS: There is no edema or consolidation. Heart is enlarged, stable. The pulmonary vascularity is normal. There is mild degenerative change in the thoracic spine. There is atherosclerotic calcification in the left carotid artery. IMPRESSION: No edema or consolidation. Stable cardiomegaly. There is left carotid artery atherosclerosis. Electronically Signed   By: Bretta Bang III M.D.   On: 07/28/2016 08:32   Ct Angio Chest Pe W And/or Wo Contrast  Result Date: 07/28/2016 CLINICAL DATA:  Dyspnea. EXAM: CT ANGIOGRAPHY CHEST WITH CONTRAST TECHNIQUE: Multidetector  CT imaging of the chest was performed using the standard protocol during bolus administration of  intravenous contrast. Multiplanar CT image reconstructions and MIPs were obtained to evaluate the vascular anatomy. CONTRAST:  75 mL Isovue 370 COMPARISON:  06/03/2016 chest CTA FINDINGS: Cardiovascular: Pulmonary arterial opacification is satisfactory without evidence of lobar or more central emboli. No definite segmental emboli are identified, however evaluation is limited by motion artifact. The heart is enlarged. LAD coronary artery calcification is noted. There is thoracic aortic atherosclerosis without aneurysm. No pericardial effusion. Mediastinum/Nodes: Scattered calcified mediastinal and hilar lymph nodes are noted. Borderline enlarged noncalcified mediastinal lymph nodes are unchanged, with the largest measuring 11 mm in short axis in the lower right paratracheal station. The esophagus, thyroid, and trachea are unremarkable. Lungs/Pleura: No pleural effusion or pneumothorax. There is a 3 mm subpleural right lower lobe nodule adjacent to the major fissure which was not clearly present on the prior CT (series 5, image 65). No evidence of pneumonia or edema. Upper Abdomen: No acute abnormality. Musculoskeletal: Thoracic spondylosis. Review of the MIP images confirms the above findings. IMPRESSION: 1. No evidence of central pulmonary emboli. Segmental assessment limited by motion. 2. 3 mm right lower lobe lung nodule. No follow-up needed if patient is low-risk. Non-contrast chest CT can be considered in 12 months if patient is high-risk. This recommendation follows the consensus statement: Guidelines for Management of Incidental Pulmonary Nodules Detected on CT Images: From the Fleischner Society 2017; Radiology 2017; 284:228-243. 3. Cardiomegaly and aortic atherosclerosis. Electronically Signed   By: Sebastian Ache M.D.   On: 07/28/2016 10:16    ASSESSMENT AND PLAN:   Principal Problem:   Acute respiratory distress Active Problems:   Persistent atrial fibrillation (HCC)   Elevated troponin    Hyperthyroidism   Noncompliance with medications   Essential hypertension   Shortness of breath   Acute on chronic diastolic CHF (congestive heart failure) (HCC)  66 year old male with past medical history of hypertension, Osteoarthritis, hyperthyroidism who presented to the hospital due to shortness of breath.  1. Ac CHF   Unknown EF   IV lasix   Appreciated cardio help.  2. Elevated troponin-likely supply demand ischemia secondary to atrial fibrillation/flutter.  - Remained stable on follow up troponin.  3. Hyperthyroidism-patient's TSH is significantly low.  - pt. Is non-compliant with his meds.  Will resume Methimazole.  - will need outpatient Endocrine follow up.   4. New Onset A. Fib/flutter - rate controlled.  I will start some Low dose Metoprolol.  - Will keep on tele, Heparin gtt, Cards consult, Echo.   5. HTN - cont. Lisinopril and will add some Low dose Metoprolol.    All the records are reviewed and case discussed with Care Management/Social Workerr. Management plans discussed with the patient, family and they are in agreement.  CODE STATUS: full.  TOTAL TIME TAKING CARE OF THIS PATIENT: 35 minutes.     POSSIBLE D/C IN 1-2 DAYS, DEPENDING ON CLINICAL CONDITION.   Altamese Dilling M.D on 07/29/2016   Between 7am to 6pm - Pager - [phone]  After 6pm go to [email or web address] - Social research officer, government  [facility]  Office  [phone]  CC: Primary care physician; No PCP Per Patient  Note: This dictation was prepared with Dragon dictation along with smaller phrase technology. Any transcriptional errors that result from this process are unintentional.

## 2016-07-30 DIAGNOSIS — I5043 Acute on chronic combined systolic (congestive) and diastolic (congestive) heart failure: Secondary | ICD-10-CM

## 2016-07-30 LAB — BASIC METABOLIC PANEL
ANION GAP: 8 (ref 5–15)
BUN: 18 mg/dL (ref 6–20)
CALCIUM: 8.8 mg/dL — AB (ref 8.9–10.3)
CO2: 28 mmol/L (ref 22–32)
Chloride: 99 mmol/L — ABNORMAL LOW (ref 101–111)
Creatinine, Ser: 0.72 mg/dL (ref 0.61–1.24)
Glucose, Bld: 106 mg/dL — ABNORMAL HIGH (ref 65–99)
POTASSIUM: 3.7 mmol/L (ref 3.5–5.1)
Sodium: 135 mmol/L (ref 135–145)

## 2016-07-30 LAB — CBC
HEMATOCRIT: 43.5 % (ref 40.0–52.0)
HEMOGLOBIN: 14.5 g/dL (ref 13.0–18.0)
MCH: 27.1 pg (ref 26.0–34.0)
MCHC: 33.4 g/dL (ref 32.0–36.0)
MCV: 81.2 fL (ref 80.0–100.0)
Platelets: 164 10*3/uL (ref 150–440)
RBC: 5.36 MIL/uL (ref 4.40–5.90)
RDW: 15.5 % — ABNORMAL HIGH (ref 11.5–14.5)
WBC: 5.1 10*3/uL (ref 3.8–10.6)

## 2016-07-30 LAB — HEPARIN LEVEL (UNFRACTIONATED)
HEPARIN UNFRACTIONATED: 0.29 [IU]/mL — AB (ref 0.30–0.70)
Heparin Unfractionated: 0.36 IU/mL (ref 0.30–0.70)

## 2016-07-30 MED ORDER — HEPARIN BOLUS VIA INFUSION
3200.0000 [IU] | Freq: Once | INTRAVENOUS | Status: DC
  Filled 2016-07-30: qty 3200

## 2016-07-30 MED ORDER — FUROSEMIDE 10 MG/ML IJ SOLN
80.0000 mg | Freq: Two times a day (BID) | INTRAMUSCULAR | Status: DC
  Administered 2016-07-30 – 2016-08-02 (×6): 80 mg via INTRAVENOUS
  Filled 2016-07-30 (×8): qty 8

## 2016-07-30 MED ORDER — METOPROLOL SUCCINATE ER 100 MG PO TB24
100.0000 mg | ORAL_TABLET | Freq: Every day | ORAL | Status: DC
  Administered 2016-07-30 – 2016-08-01 (×3): 100 mg via ORAL
  Filled 2016-07-30 (×4): qty 1

## 2016-07-30 MED ORDER — LOSARTAN POTASSIUM 25 MG PO TABS
25.0000 mg | ORAL_TABLET | Freq: Every day | ORAL | Status: DC
  Administered 2016-07-30 – 2016-08-03 (×4): 25 mg via ORAL
  Filled 2016-07-30 (×5): qty 1

## 2016-07-30 NOTE — Care Management Important Message (Signed)
Important Message  Patient Details  Name: Eugene Henderson MRN: [id number] Date of Birth: Dec 01, 1950   Medicare Important Message Given:  Yes  Initial signed IM printed from Epic and given to patient.    Eber Hong, RN 07/30/2016, 2:06 PM

## 2016-07-30 NOTE — Progress Notes (Signed)
Progress Note  Patient Name: Eugene Henderson Date of Encounter: 07/30/2016  Primary Cardiologist: Concha Se, MD   Subjective   Still with dyspnea and orthopnea.  Intermittent chest tightness as well - lasts a few mins assoc w/ dyspnea, and resolves spont.  Echo yesterday showed LV dysfxn w/ EF of 25-30%.  Inpatient Medications    Scheduled Meds: . furosemide  40 mg Intravenous Q12H  . magnesium oxide  400 mg Oral Daily  . methimazole  20 mg Oral TID  . metoprolol tartrate  50 mg Oral BID  . potassium chloride  20 mEq Oral Once  . sodium chloride flush  3 mL Intravenous Q12H   Continuous Infusions: . heparin 1,700 Units/hr (07/30/16 0615)   PRN Meds: acetaminophen **OR** acetaminophen, LORazepam, ondansetron **OR** ondansetron (ZOFRAN) IV, traMADol   Vital Signs    Vitals:   07/29/16 1946 07/29/16 2131 07/30/16 0432 07/30/16 0800  BP: 134/88  126/88 108/88  Pulse: (!) 48 100 98 64  Resp: 18  18 16   Temp: 97.9 F (36.6 C)  98.3 F (36.8 C) 97.5 F (36.4 C)  TempSrc: Oral  Oral Oral  SpO2: 100%  92% 100%  Weight:   258 lb 14.4 oz (117.4 kg)   Height:        Intake/Output Summary (Last 24 hours) at 07/30/16 0900 Last data filed at 07/30/16 1610  Gross per 24 hour  Intake              616 ml  Output             2850 ml  Net            -2234 ml   Filed Weights   07/28/16 0751 07/29/16 0612 07/30/16 0432  Weight: 290 lb (131.5 kg) 262 lb 3.2 oz (118.9 kg) 258 lb 14.4 oz (117.4 kg)    Physical Exam   GEN: Well nourished, well developed, in no acute distress.  HEENT: Grossly normal.  Neck: Supple, JVD to jaw, no carotid bruits, or masses. Cardiac: IR, IR, distant, no murmurs, rubs, or gallops. No clubbing, cyanosis, edema.  Radials/DP/PT 2+ and equal bilaterally.  Respiratory:  Respirations regular and unlabored, diminished breath sounds @ bilat bases. GI: semi-firm/protuberant, non-tender, BS + x 4. MS: no deformity or atrophy. Skin: warm and dry, no  rash. Neuro:  Strength and sensation are intact. Psych: AAOx3.  Normal affect.  Labs    Chemistry Recent Labs Lab 07/28/16 0901 07/29/16 0200  NA 137 137  K 3.8 4.0  CL 102 102  CO2 28 27  GLUCOSE 133* 124*  BUN 13 21*  CREATININE 0.77 0.83  CALCIUM 9.0 8.9  GFRNONAA >60 >60  GFRAA >60 >60  ANIONGAP 7 8     Hematology Recent Labs Lab 07/28/16 0901 07/29/16 0200 07/30/16 0011  WBC 5.0 5.4 5.1  RBC 5.21 5.32 5.36  HGB 14.3 14.2 14.5  HCT 42.2 43.9 43.5  MCV 81.1 82.6 81.2  MCH 27.5 26.7 27.1  MCHC 33.9 32.3 33.4  RDW 15.9* 15.6* 15.5*  PLT 159 158 164    Cardiac Enzymes Recent Labs Lab 07/28/16 0901 07/28/16 1435 07/28/16 1652 07/28/16 2155  TROPONINI 0.05* 0.04* 0.04* 0.04*   No results for input(s): TROPIPOC in the last 168 hours.   BNP Recent Labs Lab 07/28/16 0901  BNP 526.0*     Radiology    Ct Angio Chest Pe W And/or Wo Contrast  Result Date: 07/28/2016 CLINICAL DATA:  Dyspnea. EXAM:  CT ANGIOGRAPHY CHEST WITH CONTRAST TECHNIQUE: Multidetector CT imaging of the chest was performed using the standard protocol during bolus administration of intravenous contrast. Multiplanar CT image reconstructions and MIPs were obtained to evaluate the vascular anatomy. CONTRAST:  75 mL Isovue 370 COMPARISON:  06/03/2016 chest CTA FINDINGS: Cardiovascular: Pulmonary arterial opacification is satisfactory without evidence of lobar or more central emboli. No definite segmental emboli are identified, however evaluation is limited by motion artifact. The heart is enlarged. LAD coronary artery calcification is noted. There is thoracic aortic atherosclerosis without aneurysm. No pericardial effusion. Mediastinum/Nodes: Scattered calcified mediastinal and hilar lymph nodes are noted. Borderline enlarged noncalcified mediastinal lymph nodes are unchanged, with the largest measuring 11 mm in short axis in the lower right paratracheal station. The esophagus, thyroid, and trachea  are unremarkable. Lungs/Pleura: No pleural effusion or pneumothorax. There is a 3 mm subpleural right lower lobe nodule adjacent to the major fissure which was not clearly present on the prior CT (series 5, image 65). No evidence of pneumonia or edema. Upper Abdomen: No acute abnormality. Musculoskeletal: Thoracic spondylosis. Review of the MIP images confirms the above findings. IMPRESSION: 1. No evidence of central pulmonary emboli. Segmental assessment limited by motion. 2. 3 mm right lower lobe lung nodule. No follow-up needed if patient is low-risk. Non-contrast chest CT can be considered in 12 months if patient is high-risk. This recommendation follows the consensus statement: Guidelines for Management of Incidental Pulmonary Nodules Detected on CT Images: From the Fleischner Society 2017; Radiology 2017; 284:228-243. 3. Cardiomegaly and aortic atherosclerosis. Electronically Signed   By: Sebastian Ache M.D.   On: 07/28/2016 10:16    Telemetry    Afib, 80's to 90's - Personally Reviewed  Cardiac Studies   2D Echocardiogram 2.22.2018  Study Conclusions   - Left ventricle: The cavity size was mildly dilated. There was   moderate concentric hypertrophy. Systolic function was severely   reduced. The estimated ejection fraction was in the range of 25%   to 30%. Diffuse hypokinesis. - Aorta: Aortic root dimension: 50 mm (ED). - Aortic root: The aortic root was moderately dilated. - Mitral valve: There was moderate regurgitation. - Left atrium: The atrium was severely dilated. - Right atrium: The atrium was severely dilated. - Pulmonary arteries: Systolic pressure was moderately increased.   PA peak pressure: 50 mm Hg (S).   Patient Profile     66 y.o. male with history of hyperthyroidism previously on methimazole though not taking for approximately one month, persistent atrial fibrillation not on full dose anticoagulation as an outpatient, hypertension, ongoing tobacco abuse, alcohol abuse,  and obesity who presented to [facility] 2/21 with increased shortness of breath for approximately 6-8 weeks and was noted to be in persistent Afib with intermittent RVR.  Assessment & Plan    1.  Acute Systolic CHF:  Pt presented 2/21 with progressive dyspnea and volume overload in the setting of rapid afib, noncompliance, and hyperthyroidism.  EF found on echo 2/22 to be reduced @ 25-30%.  Pt has also been having c/p with LAD calcification on CT 2/21.  He will need ischemic eval but remains volume overloaded @ this point.  He is responding well to lasix and is minus 2.9L and 4 lbs since admission.  F/u bmet this morning and escalate lasix dose.  Switch  blocker to toprol xl.  Add low dose ARB with plan to add spiro if bp stable.  Can consider switching to entresto in future if financially feasible.  Following diuresis  over the weekend, hopefully can cath on Monday.  2.  Afib RVR:  Remains in afib - rates currently 80's to 90's at rest.  Had prior h/o "irregular heart beat" and was on oral anticoagulation in the past but was noncompliant.  Cont and consolidate  blocker therapy.  With sev dil LA, unlikely to maintain sinus rhythm in future, thus ? Utility of DCCV or PVI.  Cont heparin for now with plan to change to xarelto if financially feasible - hopefully will be more compliant with QD medication that doesn't require freq lab monitoring.  Treat hyperthyroidism - methimazole resumed.    3.  Hyperthyroidism: TSH <0.010.  Noncompliant with methimazole @ home.  Likely driving AFib and possibly tachy-induced CM.  4.  Hypertensive Heart Dzs:  bp stable on  blocker.  Will add low dose ARB given LV dysfxn.    5.  Tob Abuse:  Cessation advised.  6.  ETOH Abuse:  Cessation advised.  7.  Elevated troponin:  In setting of #1 and #2.  He has also had intermittent chest tightness with Ca2+ of the LAD noted on CTA of chest.  Will need cath in setting of LV dysfxn - likely Monday.  8.  2.3 mm RLL nodule:  Noted  on CTA on admission.  Will need f/u CT in 12 mos given smoking hx.  Signed, Nicolasa Ducking, NP  07/30/2016, 9:00 AM       Attending Note Patient seen and examined, agree with detailed note above,  Patient presentation and plan discussed on rounds.   Shortness of breath yesterday improved with higher dose Lasix Still unable to lay flat this morning Case discussed with Dr. Kirke Corin plan for cardiac catheterization on Monday right and left heart  Physical exam as above, still with crackles at the bases, elevated JVP irregular rhythm, no significant leg edema   -----Atrial fibrillation with RVR Likely chronic or at least persistent atrial fibrillation  Chads VAsc 3, male, hypertension, CHF He would benefit from anticoagulation.   he does not drive, limited financial means. Daughter drives.  Potentially could start eliquis 5 mg twice a day with company assistance as he is has limited financial means Would hold off on anticoagulation until right and left heart catheterization next Monday, February 26  -----Acute respiratory distress Likely secondary to volume overload from persistent/chronic atrial fibrillation with poorly controlled rate. Suspect also dietary noncompliance  Would continue IV Lasix twice a day  Will need oral Lasix as an outpatient with close follow-up in cardiac clinic Outpatient CHF clinic  --- Medication noncompliance May need involvement of family Visiting nurses to ensure medication compliance  --- polysubstance abuse alcohol abuse and smoking cessation recommended   ----Cardiomyopathy Recommend cardiac catheterization once able to lay flat, following diuresis Unable to exclude ischemic cardiomyopathy   Greater than 50% was spent in counseling and coordination of care with patient Total encounter time 25 minutes or more   Signed: Dossie Arbour  M.D., Ph.D. [facility]

## 2016-07-30 NOTE — Progress Notes (Addendum)
ANTICOAGULATION CONSULT NOTE - Initial Consult  Pharmacy Consult for Heparin drip Indication: atrial fibrillation  No Known Allergies  Patient Measurements: Height: 6' (182.9 cm) Weight: 290 lb (131.5 kg) IBW/kg (Calculated) : 77.6 Heparin Dosing Weight: 107.4 kg  Vital Signs: Temp: 98.1 F (36.7 C) (02/21 0749) BP: 138/108 (02/21 0800) Pulse Rate: 90 (02/21 0800)  Labs:  Recent Labs (last 2 labs)    Recent Labs  07/28/16 0901  HGB 14.3  HCT 42.2  PLT 159  APTT 25  LABPROT 13.9  INR 1.07  CREATININE 0.77  TROPONINI 0.05*      Estimated Creatinine Clearance: 129.2 mL/min (by C-G formula based on SCr of 0.77 mg/dL).   Medical History:     Past Medical History:  Diagnosis Date  . Arthritis   . Asthma   . Hypertension       Assessment: 66 yo male starting on heparin drip for AFib. Pt does not appear to be on anticoagulation as an outpt.   Hgb 14.3, Plt 159, INR 1.07, aPTT 25  Goal of Therapy:  Heparin level 0.3-0.7 units/ml Monitor platelets by anticoagulation protocol: Yes   Plan:  Heparin bolus 4000 units IV x1 then heparin drip at 1500 units/hr (=15 ml/hr) Heparin level 6h after start of drip CBC in AM  2/21 @ 1700 HL 0.27 -- will give 1600 unit bolus x 1 (16 unit/kg based on heparin Dw) then will increase infusion to 1700 units/hr and will recheck HL 2/22 @ 0200 (6 hours after start of new infusion).  2/22 02:00 heparin level 0.43. Continue current regimen and recheck in 6 hours to confirm.  2/22 0800 HL =0.34 therapeutic. This is the second therapeutic level however level has been declining 0.43>0.34 therefore I will recheck in 6 hours.  2/22 1400 HL= 1.04 Spoke with RN who states drip is running at 42ml/hr and pt reporting now signs of bleeding. Pt was called to confirm where the lab was drawn. initially he stated it was drawn from his left hand (IV in left forearm), but then stated it was his right. Asked if it was the  opposite of where the IV was and he stated yes. RN informed to hold heparin drip for 1 hour and resume at 1450 units/hr. Recheck in 6 hours.   2/22 2200 Heparin level <0.1 Per RN drip is running but had self-paused during shift. Will recheck with AM labs to confirm before making adjustments.  2/23 03:30 Repeat heparin level <0.10. Per RN line may not have been in pump properly and has been repositioned. Will go ahead and increase to 1700 units/hr on which patient was previously therapeutic and recheck in 6 hours. Will not bolus at this time.   Thank you for this consult.  Olene Floss, Pharm.D, BCPS Clinical Pharmacist  07/30/16    07/30/2016

## 2016-07-30 NOTE — Progress Notes (Signed)
ANTICOAGULATION CONSULT NOTE - Initial Consult  Pharmacy Consult for Heparin drip Indication: atrial fibrillation  No Known Allergies  Patient Measurements: Height: 6' (182.9 cm) Weight: 290 lb (131.5 kg) IBW/kg (Calculated) : 77.6 Heparin Dosing Weight: 107.4 kg  Vital Signs: Temp: 98.1 F (36.7 C) (02/21 0749) BP: 138/108 (02/21 0800) Pulse Rate: 90 (02/21 0800)  Labs:  Recent Labs (last 2 labs)    Recent Labs  07/28/16 0901  HGB 14.3  HCT 42.2  PLT 159  APTT 25  LABPROT 13.9  INR 1.07  CREATININE 0.77  TROPONINI 0.05*      Estimated Creatinine Clearance: 129.2 mL/min (by C-G formula based on SCr of 0.77 mg/dL).   Medical History:     Past Medical History:  Diagnosis Date  . Arthritis   . Asthma   . Hypertension       Assessment: 66 yo male starting on heparin drip for AFib. Pt does not appear to be on anticoagulation as an outpt.   Hgb 14.3, Plt 159, INR 1.07, aPTT 25  Goal of Therapy:  Heparin level 0.3-0.7 units/ml Monitor platelets by anticoagulation protocol: Yes   Plan:  Heparin bolus 4000 units IV x1 then heparin drip at 1500 units/hr (=15 ml/hr) Heparin level 6h after start of drip CBC in AM  2/21 @ 1700 HL 0.27 -- will give 1600 unit bolus x 1 (16 unit/kg based on heparin Dw) then will increase infusion to 1700 units/hr and will recheck HL 2/22 @ 0200 (6 hours after start of new infusion).  2/22 02:00 heparin level 0.43. Continue current regimen and recheck in 6 hours to confirm.  2/22 0800 HL =0.34 therapeutic. This is the second therapeutic level however level has been declining 0.43>0.34 therefore I will recheck in 6 hours.  2/22 1400 HL= 1.04 Spoke with RN who states drip is running at 30ml/hr and pt reporting no signs of bleeding. Pt was called to confirm where the lab was drawn. initially he stated it was drawn from his left hand (IV in left forearm), but then stated it was his right. Asked if it was the  opposite of where the IV was and he stated yes. RN informed to hold heparin drip for 1 hour and resume at 1450 units/hr. Recheck in 6 hours.   2/22 2200 Heparin level <0.1 Per RN drip is running but had self-paused during shift. Will recheck with AM labs to confirm before making adjustments.  2/23 03:30 Repeat heparin level <0.10. Per RN line may not have been in pump properly and has been repositioned. Will go ahead and increase to 1700 units/hr on which patient was previously therapeutic and recheck in 6 hours. Will not bolus at this time.  2/23 1200 HL =0.29 Will increase rate to 1900 units/hr and recheck in 6 hours.  Thank you for this consult.  Olene Floss, Pharm.D, BCPS Clinical Pharmacist  07/30/16

## 2016-07-30 NOTE — Progress Notes (Signed)
Initial HF Clinic appointment scheduled for August 10, 2016 at 11:30am. Thank you for the referral.

## 2016-07-30 NOTE — Progress Notes (Addendum)
ANTICOAGULATION CONSULT NOTE - Initial Consult  Pharmacy Consult for Heparin drip Indication: atrial fibrillation  No Known Allergies  Patient Measurements: Height: 6' (182.9 cm) Weight: 290 lb (131.5 kg) IBW/kg (Calculated) : 77.6 Heparin Dosing Weight: 107.4 kg  Vital Signs: Temp: 98.1 F (36.7 C) (02/21 0749) BP: 138/108 (02/21 0800) Pulse Rate: 90 (02/21 0800)  Labs:  Recent Labs (last 2 labs)    Recent Labs  07/28/16 0901  HGB 14.3  HCT 42.2  PLT 159  APTT 25  LABPROT 13.9  INR 1.07  CREATININE 0.77  TROPONINI 0.05*      Estimated Creatinine Clearance: 129.2 mL/min (by C-G formula based on SCr of 0.77 mg/dL).   Medical History:     Past Medical History:  Diagnosis Date  . Arthritis   . Asthma   . Hypertension       Assessment: 66 yo male starting on heparin drip for AFib. Pt does not appear to be on anticoagulation as an outpt.   Hgb 14.3, Plt 159, INR 1.07, aPTT 25  Goal of Therapy:  Heparin level 0.3-0.7 units/ml Monitor platelets by anticoagulation protocol: Yes   Plan:  Heparin bolus 4000 units IV x1 then heparin drip at 1500 units/hr (=15 ml/hr) Heparin level 6h after start of drip CBC in AM  2/21 @ 1700 HL 0.27 -- will give 1600 unit bolus x 1 (16 unit/kg based on heparin Dw) then will increase infusion to 1700 units/hr and will recheck HL 2/22 @ 0200 (6 hours after start of new infusion).  2/22 02:00 heparin level 0.43. Continue current regimen and recheck in 6 hours to confirm.  2/22 0800 HL =0.34 therapeutic. This is the second therapeutic level however level has been declining 0.43>0.34 therefore I will recheck in 6 hours.  2/22 1400 HL= 1.04 Spoke with RN who states drip is running at 58ml/hr and pt reporting no signs of bleeding. Pt was called to confirm where the lab was drawn. initially he stated it was drawn from his left hand (IV in left forearm), but then stated it was his right. Asked if it was the  opposite of where the IV was and he stated yes. RN informed to hold heparin drip for 1 hour and resume at 1450 units/hr. Recheck in 6 hours.   2/22 2200 Heparin level <0.1 Per RN drip is running but had self-paused during shift. Will recheck with AM labs to confirm before making adjustments.  2/23 03:30 Repeat heparin level <0.10. Per RN line may not have been in pump properly and has been repositioned. Will go ahead and increase to 1700 units/hr on which patient was previously therapeutic and recheck in 6 hours. Will not bolus at this time.  2/23 1200 HL =0.29 Will increase rate to 1900 units/hr and recheck in 6 hours.  2/23: HL @ 20:00 = 0.36 Will continue this pt on current rate of 1900 units/hr and recheck HL in 6 hrs on 2/24 @ 0200.   2/24 02:00 heparin level 0.50. Continue current regimen. Recheck heparin level and CBC with tomorrow AM labs.  Thank you for this consult.  Robbins,Jason D Clinical Pharmacist  07/30/16

## 2016-07-30 NOTE — Progress Notes (Signed)
[facility] [place] [facility]   PATIENT NAME: Eugene Henderson    MR#:  [id number]  DATE OF BIRTH:  November 18, 1950  SUBJECTIVE:  CHIEF COMPLAINT:   Chief Complaint  Patient presents with  . Shortness of Breath    Came with SOB, having Fluid overload, CHF- with a fib and RVR.   On Heparin drip, and metoprolol for HR control.   Cardio following. Feels little better.   Had good diuresis, EF 25% per echo.  REVIEW OF SYSTEMS:  CONSTITUTIONAL: No fever, fatigue or weakness.  EYES: No blurred or double vision.  EARS, NOSE, AND THROAT: No tinnitus or ear pain.  RESPIRATORY: No cough, positive for shortness of breath, wheezing or hemoptysis.  CARDIOVASCULAR: No chest pain, orthopnea, edema.  GASTROINTESTINAL: No nausea, vomiting, diarrhea or abdominal pain.  GENITOURINARY: No dysuria, hematuria.  ENDOCRINE: No polyuria, nocturia,  HEMATOLOGY: No anemia, easy bruising or bleeding SKIN: No rash or lesion. MUSCULOSKELETAL: No joint pain or arthritis.   NEUROLOGIC: No tingling, numbness, weakness.  PSYCHIATRY: No anxiety or depression.   ROS  DRUG ALLERGIES:  No Known Allergies  VITALS:  Blood pressure 112/81, pulse 60, temperature 98.1 F (36.7 C), temperature source Oral, resp. rate 18, height 6' (1.829 m), weight 117.4 kg (258 lb 14.4 oz), SpO2 99 %.  PHYSICAL EXAMINATION:  GENERAL:  66 y.o.-year-old patient lying in the bed with no acute distress.  EYES: Pupils equal, round, reactive to light and accommodation. No scleral icterus. Extraocular muscles intact.  HEENT: Head atraumatic, normocephalic. Oropharynx and nasopharynx clear.  NECK:  Supple, no jugular venous distention. No thyroid enlargement, no tenderness.  LUNGS: Normal breath sounds bilaterally, no wheezing, some crepitation. No use of accessory muscles of respiration.  CARDIOVASCULAR: S1, S2 fast. No murmurs, rubs, or gallops.  ABDOMEN: Soft, nontender, nondistended. Bowel sounds present. No  organomegaly or mass.  EXTREMITIES: No pedal edema, cyanosis, or clubbing.  NEUROLOGIC: Cranial nerves II through XII are intact. Muscle strength 5/5 in all extremities. Sensation intact. Gait not checked.  PSYCHIATRIC: The patient is alert and oriented x 3.  SKIN: No obvious rash, lesion, or ulcer.   Physical Exam LABORATORY PANEL:   CBC  Recent Labs Lab 07/30/16 0011  WBC 5.1  HGB 14.5  HCT 43.5  PLT 164   ------------------------------------------------------------------------------------------------------------------  Chemistries   Recent Labs Lab 07/28/16 1652  07/30/16 0934  NA  --   < > 135  K  --   < > 3.7  CL  --   < > 99*  CO2  --   < > 28  GLUCOSE  --   < > 106*  BUN  --   < > 18  CREATININE  --   < > 0.72  CALCIUM  --   < > 8.8*  MG 1.7  --   --   < > = values in this interval not displayed. ------------------------------------------------------------------------------------------------------------------  Cardiac Enzymes  Recent Labs Lab 07/28/16 1652 07/28/16 2155  TROPONINI 0.04* 0.04*   ------------------------------------------------------------------------------------------------------------------  RADIOLOGY:  No results found.  ASSESSMENT AND PLAN:   Principal Problem:   Acute respiratory distress Active Problems:   Persistent atrial fibrillation (HCC)   Elevated troponin   Hyperthyroidism   Noncompliance with medications   Essential hypertension   Shortness of breath   Acute on chronic combined systolic and diastolic CHF (congestive heart failure) (HCC)  66 year old male with past medical history of hypertension, Osteoarthritis, hyperthyroidism who presented to the hospital due to shortness  of breath.  1. Ac systolic CHF   25-30% EF   IV lasix   Appreciated cardio help.   On metoprolol.    ACE inhibitor on discharge.  2. Elevated troponin-likely supply demand ischemia secondary to atrial fibrillation/flutter.  - Remained  stable on follow up troponin.   Cardiology planning for cath on Monday, to check for reason for cardiomyopathy.  3. Hyperthyroidism-patient's TSH is significantly low.  - pt. Is non-compliant with his meds.  resume Methimazole.  - will need outpatient Endocrine follow up.   4. New Onset A. Fib/flutter - rate controlled.   Metoprolol.  - Will keep on tele, Heparin gtt, Cards consult, Echo.    May switch to oral anticoagulant after cath.  5. HTN - cont. Lisinopril and  Metoprolol.    All the records are reviewed and case discussed with Care Management/Social Workerr. Management plans discussed with the patient, family and they are in agreement.  CODE STATUS: full.  TOTAL TIME TAKING CARE OF THIS PATIENT: 35 minutes.     POSSIBLE D/C IN 1-2 DAYS, DEPENDING ON CLINICAL CONDITION.   Altamese Dilling M.D on 07/30/2016   Between 7am to 6pm - Pager - [phone]  After 6pm go to [email or web address] - Social research officer, government  [facility]  Office  [phone]  CC: Primary care physician; No PCP Per Patient  Note: This dictation was prepared with Dragon dictation along with smaller phrase technology. Any transcriptional errors that result from this process are unintentional.

## 2016-07-31 LAB — BASIC METABOLIC PANEL
ANION GAP: 5 (ref 5–15)
BUN: 17 mg/dL (ref 6–20)
CHLORIDE: 99 mmol/L — AB (ref 101–111)
CO2: 30 mmol/L (ref 22–32)
CREATININE: 0.75 mg/dL (ref 0.61–1.24)
Calcium: 8.8 mg/dL — ABNORMAL LOW (ref 8.9–10.3)
GFR calc non Af Amer: >60 mL/min (ref >60)
Glucose, Bld: 109 mg/dL — ABNORMAL HIGH (ref 65–99)
Potassium: 3.8 mmol/L (ref 3.5–5.1)
SODIUM: 134 mmol/L — AB (ref 135–145)

## 2016-07-31 LAB — TSH

## 2016-07-31 LAB — CBC
HEMATOCRIT: 42.5 % (ref 40.0–52.0)
Hemoglobin: 14.1 g/dL (ref 13.0–18.0)
MCH: 27 pg (ref 26.0–34.0)
MCHC: 33.2 g/dL (ref 32.0–36.0)
MCV: 81.3 fL (ref 80.0–100.0)
Platelets: 169 10*3/uL (ref 150–440)
RBC: 5.23 MIL/uL (ref 4.40–5.90)
RDW: 15.4 % — ABNORMAL HIGH (ref 11.5–14.5)
WBC: 4.8 10*3/uL (ref 3.8–10.6)

## 2016-07-31 LAB — HEPARIN LEVEL (UNFRACTIONATED): HEPARIN UNFRACTIONATED: 0.5 [IU]/mL (ref 0.30–0.70)

## 2016-07-31 NOTE — Progress Notes (Signed)
[facility] [place] [facility]   PATIENT NAME: Eugene Henderson    MR#:  [id number]  DATE OF BIRTH:  1951-02-04  SUBJECTIVE:  CHIEF COMPLAINT:   Chief Complaint  Patient presents with  . Shortness of Breath    Came with SOB, having Fluid overload, CHF- with a fib and RVR.   On Heparin drip, and metoprolol for HR control.  Got Short of breath overnight with orthopnea and placed on O2.  REVIEW OF SYSTEMS:  CONSTITUTIONAL: No fever, fatigue or weakness.  EYES: No blurred or double vision.  EARS, NOSE, AND THROAT: No tinnitus or ear pain.  RESPIRATORY: No cough, positive for shortness of breath, wheezing or hemoptysis.  CARDIOVASCULAR: No chest pain, orthopnea, edema.  GASTROINTESTINAL: No nausea, vomiting, diarrhea or abdominal pain.  GENITOURINARY: No dysuria, hematuria.  ENDOCRINE: No polyuria, nocturia,  HEMATOLOGY: No anemia, easy bruising or bleeding SKIN: No rash or lesion. MUSCULOSKELETAL: No joint pain or arthritis.   NEUROLOGIC: No tingling, numbness, weakness.  PSYCHIATRY: No anxiety or depression.   ROS  DRUG ALLERGIES:  No Known Allergies  VITALS:  Blood pressure 108/86, pulse 74, temperature 97.5 F (36.4 C), temperature source Oral, resp. rate 18, height 6' (1.829 m), weight 116.8 kg (257 lb 8 oz), SpO2 92 %.  PHYSICAL EXAMINATION:  GENERAL:  66 y.o.-year-old patient lying in the bed with no acute distress.  EYES: Pupils equal, round, reactive to light and accommodation. No scleral icterus. Extraocular muscles intact.  HEENT: Head atraumatic, normocephalic. Oropharynx and nasopharynx clear.  NECK:  Supple, no jugular venous distention. No thyroid enlargement, no tenderness.  LUNGS: Normal breath sounds bilaterally, no wheezing, some crepitation. No use of accessory muscles of respiration.  CARDIOVASCULAR: S1, S2 fast. No murmurs, rubs, or gallops.  ABDOMEN: Soft, nontender, nondistended. Bowel sounds present. No organomegaly or mass.   EXTREMITIES: No pedal edema, cyanosis, or clubbing.  NEUROLOGIC: Cranial nerves II through XII are intact. Muscle strength 5/5 in all extremities. Sensation intact. Gait not checked.  PSYCHIATRIC: The patient is alert and oriented x 3.  SKIN: No obvious rash, lesion, or ulcer.   Physical Exam LABORATORY PANEL:   CBC  Recent Labs Lab 07/31/16 0146  WBC 4.8  HGB 14.1  HCT 42.5  PLT 169   ------------------------------------------------------------------------------------------------------------------  Chemistries   Recent Labs Lab 07/28/16 1652  07/31/16 0146  NA  --   < > 134*  K  --   < > 3.8  CL  --   < > 99*  CO2  --   < > 30  GLUCOSE  --   < > 109*  BUN  --   < > 17  CREATININE  --   < > 0.75  CALCIUM  --   < > 8.8*  MG 1.7  --   --   < > = values in this interval not displayed. ------------------------------------------------------------------------------------------------------------------  Cardiac Enzymes  Recent Labs Lab 07/28/16 1652 07/28/16 2155  TROPONINI 0.04* 0.04*   ------------------------------------------------------------------------------------------------------------------  RADIOLOGY:  No results found.  ASSESSMENT AND PLAN:   Principal Problem:   Acute respiratory distress Active Problems:   Persistent atrial fibrillation (HCC)   Elevated troponin   Hyperthyroidism   Noncompliance with medications   Essential hypertension   Shortness of breath   Acute on chronic combined systolic and diastolic CHF (congestive heart failure) (HCC)  66 year old male with past medical history of hypertension, Osteoarthritis, hyperthyroidism who presented to the hospital due to shortness of breath.  1.  Acute systolic CHF - Likely ischemic cardiomyopathy   25-30% EF   IV lasix   Appreciated cardiology input   On metoprolol.   ACE inhibitor on discharge.  2. Elevated troponin-likely supply demand ischemia secondary to atrial  fibrillation/flutter.  - Remained stable on follow up troponin.   Cardiology planning for cath on Monday, for work up of cardiomyopathy.  3. Hyperthyroidism-patient's TSH was significantly low.  - pt. Is non-compliant with his meds.  resumed Methimazole.  - will need outpatient Endocrine follow up.  Repeat TSH  4. New Onset A. Fib/flutter - rate controlled.   Metoprolol.  - On telemetry, Heparin gtt, Cards consult, Echo.    Oral anti-coagulation after cath  5. HTN - cont. Lisinopril and  Metoprolol.   All the records are reviewed and case discussed with Care Management/Social Worker. Management plans discussed with the patient, family and they are in agreement.  CODE STATUS: Full.  TOTAL TIME TAKING CARE OF THIS PATIENT: 66 minutes.   POSSIBLE D/C IN 1-2 DAYS, DEPENDING ON CLINICAL CONDITION.  Milagros Loll R M.D on 07/31/2016   Between 7am to 6pm - Pager - [phone]  After 6pm go to [email or web address] - Social research officer, government  [facility]  Office  [phone]  CC: Primary care physician; No PCP Per Patient  Note: This dictation was prepared with Dragon dictation along with smaller phrase technology. Any transcriptional errors that result from this process are unintentional.

## 2016-07-31 NOTE — Progress Notes (Signed)
Pt. Refuses bed alarm.

## 2016-07-31 NOTE — Progress Notes (Signed)
Progress Note  Patient Name: Eugene Henderson Date of Encounter: 07/31/2016  Primary Cardiologist: Mariah Milling  Subjective   No chest pain. "I'm getting better".   Inpatient Medications    Scheduled Meds: . furosemide  80 mg Intravenous Q12H  . losartan  25 mg Oral Daily  . magnesium oxide  400 mg Oral Daily  . methimazole  20 mg Oral TID  . metoprolol succinate  100 mg Oral Daily  . potassium chloride  20 mEq Oral Once  . sodium chloride flush  3 mL Intravenous Q12H   Continuous Infusions: . heparin 1,900 Units/hr (07/31/16 0422)   PRN Meds: acetaminophen **OR** acetaminophen, LORazepam, ondansetron **OR** ondansetron (ZOFRAN) IV, traMADol   Vital Signs    Vitals:   07/30/16 0800 07/30/16 1155 07/30/16 2009 07/31/16 0421  BP: 108/88 (!) 122/95 112/81 (!) 108/91  Pulse: 64 91 60 100  Resp: 16  18 18   Temp: 97.5 F (36.4 C) 97.6 F (36.4 C) 98.1 F (36.7 C) 98.2 F (36.8 C)  TempSrc: Oral Oral Oral Oral  SpO2: 100% 100% 99% 90%  Weight:    257 lb 8 oz (116.8 kg)  Height:        Intake/Output Summary (Last 24 hours) at 07/31/16 0740 Last data filed at 07/31/16 1610  Gross per 24 hour  Intake          1079.44 ml  Output             1550 ml  Net          -470.56 ml   Filed Weights   07/29/16 0612 07/30/16 0432 07/31/16 0421  Weight: 262 lb 3.2 oz (118.9 kg) 258 lb 14.4 oz (117.4 kg) 257 lb 8 oz (116.8 kg)    Telemetry    Atrial fib with a RVR - Personally Reviewed    Physical Exam   GEN: No acute distress.   Neck: 6 cm JVD Cardiac: IRIR tachy, no murmurs, rubs, or gallops.  Respiratory: Clear to auscultation bilaterally. GI: Soft, nontender, non-distended  MS: No edema; No deformity. Neuro:  Nonfocal  Psych: Normal affect   Labs    Chemistry Recent Labs Lab 07/29/16 0200 07/30/16 0934 07/31/16 0146  NA 137 135 134*  K 4.0 3.7 3.8  CL 102 99* 99*  CO2 27 28 30   GLUCOSE 124* 106* 109*  BUN 21* 18 17  CREATININE 0.83 0.72 0.75  CALCIUM  8.9 8.8* 8.8*  GFRNONAA >60 >60 >60  GFRAA >60 >60 >60  ANIONGAP 8 8 5      Hematology Recent Labs Lab 07/29/16 0200 07/30/16 0011 07/31/16 0146  WBC 5.4 5.1 4.8  RBC 5.32 5.36 5.23  HGB 14.2 14.5 14.1  HCT 43.9 43.5 42.5  MCV 82.6 81.2 81.3  MCH 26.7 27.1 27.0  MCHC 32.3 33.4 33.2  RDW 15.6* 15.5* 15.4*  PLT 158 164 169    Cardiac Enzymes Recent Labs Lab 07/28/16 0901 07/28/16 1435 07/28/16 1652 07/28/16 2155  TROPONINI 0.05* 0.04* 0.04* 0.04*   No results for input(s): TROPIPOC in the last 168 hours.   BNP Recent Labs Lab 07/28/16 0901  BNP 526.0*     DDimer No results for input(s): DDIMER in the last 168 hours.   Radiology    No results found.  Cardiac Studies   2D Echocardiogram 2.22.2018  Study Conclusions  - Left ventricle: The cavity size was mildly dilated. There was moderate concentric hypertrophy. Systolic function was severely reduced. The estimated ejection fraction was in  the range of 25% to 30%. Diffuse hypokinesis. - Aorta: Aortic root dimension: 50 mm (ED). - Aortic root: The aortic root was moderately dilated. - Mitral valve: There was moderate regurgitation. - Left atrium: The atrium was severely dilated. - Right atrium: The atrium was severely dilated. - Pulmonary arteries: Systolic pressure was moderately increased. PA peak pressure: 50 mm Hg (S).   Patient Profile     66 y.o.malewith history of hyperthyroidism previously on methimazole though not taking for approximately one month, persistent atrial fibrillation not on full dose anticoagulation as an outpatient, hypertension, ongoing tobacco abuse, alcohol abuse, and obesity who [facility] with increased shortness of breath for approximately 6-8 weeks and was noted to be in persistent Afib with intermittent RVR.   Assessment & Plan    1  Systolic CHF  - this is a new finding. He is pending cardiac cath tomorrow as per Dr. Knute Neu. He will need  initiation of afterload reduction.  2  Afib with RVR - his ventricular rate is still increased. Until he is euthyroid his ventricular rate will be difficult to control.  3  Hyperthyroidsim  TSH low  Noncompliant with methimazole - this has been started back. He understands the importance of taking this medication going forward.   4  HTN - his blood pressure is improved.   5  CAD  Calc of LAD noted   Will need an ischemic eval at some point.  Tentatively has heart cath on for Monday as per Dr. Knute Neu  6  Tob  Cessation advised  7  EtOH  Cessation advised   Signed, Lewayne Bunting, MD  07/31/2016, 7:40 AM

## 2016-07-31 NOTE — Progress Notes (Signed)
Advance care planning  Discussed regarding his Cardiomyopathy, Afib and non- compliance  Patient understands the seriousness of his illness and wants to get the workup needed so that he can feel back to normal.  Discussed code status and he mentions he has not wanted CPR in the past but would like to be a full code for trial but not be on ventilator for prolonged periods. His Daughter is his HCPOA.  Orders entered.   Time spent 20 minutes

## 2016-08-01 LAB — BASIC METABOLIC PANEL
ANION GAP: 8 (ref 5–15)
BUN: 18 mg/dL (ref 6–20)
CALCIUM: 9.1 mg/dL (ref 8.9–10.3)
CO2: 29 mmol/L (ref 22–32)
CREATININE: 0.77 mg/dL (ref 0.61–1.24)
Chloride: 97 mmol/L — ABNORMAL LOW (ref 101–111)
GLUCOSE: 96 mg/dL (ref 65–99)
Potassium: 3.7 mmol/L (ref 3.5–5.1)
Sodium: 134 mmol/L — ABNORMAL LOW (ref 135–145)

## 2016-08-01 LAB — CBC
HEMATOCRIT: 45.6 % (ref 40.0–52.0)
Hemoglobin: 15.6 g/dL (ref 13.0–18.0)
MCH: 27.4 pg (ref 26.0–34.0)
MCHC: 34.1 g/dL (ref 32.0–36.0)
MCV: 80.2 fL (ref 80.0–100.0)
PLATELETS: 168 10*3/uL (ref 150–440)
RBC: 5.68 MIL/uL (ref 4.40–5.90)
RDW: 15.6 % — AB (ref 11.5–14.5)
WBC: 5.2 10*3/uL (ref 3.8–10.6)

## 2016-08-01 LAB — HEPARIN LEVEL (UNFRACTIONATED)
HEPARIN UNFRACTIONATED: 0.74 [IU]/mL — AB (ref 0.30–0.70)
Heparin Unfractionated: <0.1 IU/mL — ABNORMAL LOW (ref 0.30–0.70)
Heparin Unfractionated: 0.71 IU/mL — ABNORMAL HIGH (ref 0.30–0.70)

## 2016-08-01 LAB — MAGNESIUM: Magnesium: 1.8 mg/dL (ref 1.7–2.4)

## 2016-08-01 MED ORDER — SODIUM CHLORIDE 0.9% FLUSH: 3.0000 mL | INTRAVENOUS | Status: DC | PRN

## 2016-08-01 MED ORDER — SODIUM CHLORIDE 0.9 % IV SOLN
INTRAVENOUS | Status: DC
  Administered 2016-08-02: 06:00:00 via INTRAVENOUS

## 2016-08-01 MED ORDER — HEPARIN BOLUS VIA INFUSION
3200.0000 [IU] | Freq: Once | INTRAVENOUS | Status: AC
  Administered 2016-08-01: 3200 [IU] via INTRAVENOUS
  Filled 2016-08-01: qty 3200

## 2016-08-01 MED ORDER — PNEUMOCOCCAL VAC POLYVALENT 25 MCG/0.5ML IJ INJ
0.5000 mL | INJECTION | INTRAMUSCULAR | Status: AC
  Administered 2016-08-02: 0.5 mL via INTRAMUSCULAR
  Filled 2016-08-01: qty 0.5

## 2016-08-01 MED ORDER — SODIUM CHLORIDE 0.9% FLUSH
3.0000 mL | Freq: Two times a day (BID) | INTRAVENOUS | Status: DC
  Administered 2016-08-01: 3 mL via INTRAVENOUS

## 2016-08-01 MED ORDER — HEPARIN (PORCINE) IN NACL 100-0.45 UNIT/ML-% IJ SOLN
2100.0000 [IU]/h | INTRAMUSCULAR | Status: DC
  Administered 2016-08-01: 2300 [IU]/h via INTRAVENOUS
  Administered 2016-08-01: 2200 [IU]/h via INTRAVENOUS
  Filled 2016-08-01: qty 250

## 2016-08-01 MED ORDER — SODIUM CHLORIDE 0.9 % IV SOLN
INTRAVENOUS | Status: DC
  Administered 2016-08-02: 05:00:00 via INTRAVENOUS

## 2016-08-01 MED ORDER — SODIUM CHLORIDE 0.9 % IV SOLN: 250.0000 mL | INTRAVENOUS | Status: DC | PRN

## 2016-08-01 MED ORDER — ASPIRIN 81 MG PO CHEW
81.0000 mg | CHEWABLE_TABLET | ORAL | Status: AC
  Administered 2016-08-02: 81 mg via ORAL
  Filled 2016-08-01: qty 1

## 2016-08-01 NOTE — Progress Notes (Signed)
Progress Note  Patient Name: Eugene Henderson Date of Encounter: 08/01/2016  Primary Cardiologist: Mariah Milling    Subjective   No chest pain or sob.  Inpatient Medications    Scheduled Meds: . furosemide  80 mg Intravenous Q12H  . losartan  25 mg Oral Daily  . magnesium oxide  400 mg Oral Daily  . methimazole  20 mg Oral TID  . metoprolol succinate  100 mg Oral Daily  . [START ON 08/02/2016] pneumococcal 23 valent vaccine  0.5 mL Intramuscular Tomorrow-1000  . potassium chloride  20 mEq Oral Once  . sodium chloride flush  3 mL Intravenous Q12H   Continuous Infusions: . heparin 1,900 Units/hr (08/01/16 0848)   PRN Meds: acetaminophen **OR** acetaminophen, LORazepam, ondansetron **OR** ondansetron (ZOFRAN) IV, traMADol   Vital Signs    Vitals:   07/31/16 1910 08/01/16 0443 08/01/16 0500 08/01/16 0843  BP: 123/86 119/86  119/65  Pulse: 84 64  78  Resp: 18 18  20   Temp: 98.6 F (37 C) 98.3 F (36.8 C)  97.8 F (36.6 C)  TempSrc: Oral Oral  Oral  SpO2: 93% 98%  97%  Weight:   254 lb 3.2 oz (115.3 kg)   Height:        Intake/Output Summary (Last 24 hours) at 08/01/16 0900 Last data filed at 08/01/16 1610  Gross per 24 hour  Intake           935.02 ml  Output             1775 ml  Net          -839.98 ml   Filed Weights   07/30/16 0432 07/31/16 0421 08/01/16 0500  Weight: 258 lb 14.4 oz (117.4 kg) 257 lb 8 oz (116.8 kg) 254 lb 3.2 oz (115.3 kg)    Telemetry    Atrial fib with a CVR - Personally Reviewed  ECG    None today - Personally Reviewed  Physical Exam   GEN: No acute distress.   Neck: 6 cm JVD Cardiac: RRR, no murmurs, rubs, or gallops.  Respiratory: Clear to auscultation bilaterally. GI: Soft, nontender, non-distended  MS: No edema; No deformity. Neuro:  Nonfocal  Psych: Normal affect   Labs    Chemistry Recent Labs Lab 07/30/16 0934 07/31/16 0146 08/01/16 0410  NA 135 134* 134*  K 3.7 3.8 3.7  CL 99* 99* 97*  CO2 28 30 29   GLUCOSE  106* 109* 96  BUN 18 17 18   CREATININE 0.72 0.75 0.77  CALCIUM 8.8* 8.8* 9.1  GFRNONAA >60 >60 >60  GFRAA >60 >60 >60  ANIONGAP 8 5 8      Hematology Recent Labs Lab 07/30/16 0011 07/31/16 0146 08/01/16 0410  WBC 5.1 4.8 5.2  RBC 5.36 5.23 5.68  HGB 14.5 14.1 15.6  HCT 43.5 42.5 45.6  MCV 81.2 81.3 80.2  MCH 27.1 27.0 27.4  MCHC 33.4 33.2 34.1  RDW 15.5* 15.4* 15.6*  PLT 164 169 168    Cardiac Enzymes Recent Labs Lab 07/28/16 0901 07/28/16 1435 07/28/16 1652 07/28/16 2155  TROPONINI 0.05* 0.04* 0.04* 0.04*   No results for input(s): TROPIPOC in the last 168 hours.   BNP Recent Labs Lab 07/28/16 0901  BNP 526.0*     DDimer No results for input(s): DDIMER in the last 168 hours.   Radiology    No results found.    Assessment & Plan    1  Systolic CHF  New finding  Plan for Cath  tomorrow. Will make NPO  2  AFib - his rate is well controlled.   3  Hyperthyroidims  Noncompliant with meds  Restarted  4  HTN  - his blood pressure is better controlled.   5  CAD  CT with evid of CAD - await left heart cath  6  Tob  Counselled - he knows he needs to stop smoking   Signed, Lewayne Bunting, MD  08/01/2016, 9:00 AM

## 2016-08-01 NOTE — Progress Notes (Addendum)
ANTICOAGULATION CONSULT NOTE - Initial Consult  Pharmacy Consult for Heparin drip Indication: atrial fibrillation  No Known Allergies  Patient Measurements: Height: 6' (182.9 cm) Weight: 290 lb (131.5 kg) IBW/kg (Calculated) : 77.6 Heparin Dosing Weight: 107.4 kg  Vital Signs: Temp: 98.1 F (36.7 C) (02/21 0749) BP: 138/108 (02/21 0800) Pulse Rate: 90 (02/21 0800)  Labs:  Recent Labs (last 2 labs)    Recent Labs  07/28/16 0901  HGB 14.3  HCT 42.2  PLT 159  APTT 25  LABPROT 13.9  INR 1.07  CREATININE 0.77  TROPONINI 0.05*      Estimated Creatinine Clearance: 129.2 mL/min (by C-G formula based on SCr of 0.77 mg/dL).   Medical History:     Past Medical History:  Diagnosis Date  . Arthritis   . Asthma   . Hypertension       Assessment: 66 yo male starting on heparin drip for AFib. Pt does not appear to be on anticoagulation as an outpt.   Hgb 14.3, Plt 159, INR 1.07, aPTT 25  Goal of Therapy:  Heparin level 0.3-0.7 units/ml Monitor platelets by anticoagulation protocol: Yes   Plan:  Heparin bolus 4000 units IV x1 then heparin drip at 1500 units/hr (=15 ml/hr) Heparin level 6h after start of drip CBC in AM  2/21 @ 1700 HL 0.27 -- will give 1600 unit bolus x 1 (16 unit/kg based on heparin Dw) then will increase infusion to 1700 units/hr and will recheck HL 2/22 @ 0200 (6 hours after start of new infusion).  2/22 02:00 heparin level 0.43. Continue current regimen and recheck in 6 hours to confirm.  2/22 0800 HL =0.34 therapeutic. This is the second therapeutic level however level has been declining 0.43>0.34 therefore I will recheck in 6 hours.  2/22 1400 HL= 1.04 Spoke with RN who states drip is running at 90ml/hr and pt reporting no signs of bleeding. Pt was called to confirm where the lab was drawn. initially he stated it was drawn from his left hand (IV in left forearm), but then stated it was his right. Asked if it was the  opposite of where the IV was and he stated yes. RN informed to hold heparin drip for 1 hour and resume at 1450 units/hr. Recheck in 6 hours.   2/22 2200 Heparin level <0.1 Per RN drip is running but had self-paused during shift. Will recheck with AM labs to confirm before making adjustments.  2/23 03:30 Repeat heparin level <0.10. Per RN line may not have been in pump properly and has been repositioned. Will go ahead and increase to 1700 units/hr on which patient was previously therapeutic and recheck in 6 hours. Will not bolus at this time.  2/23 1200 HL =0.29 Will increase rate to 1900 units/hr and recheck in 6 hours.  2/23: HL @ 20:00 = 0.36 Will continue this pt on current rate of 1900 units/hr and recheck HL in 6 hrs on 2/24 @ 0200.   2/24 02:00 heparin level 0.50. Continue current regimen. Recheck heparin level and CBC with tomorrow AM labs.  2/25 AM heparin level <0.1. Per RN patient had pulled out IV and new access was obtained with resumption of infusion at around 04:00. Will recheck heparin level at 10:00, ~6 hours after restart.  2/25 10:00 HL <0.1.  Ordered bolus of 3200 units and increased drip rate to 2300 units/hr. Will recheck level tonight at 1600.  2/25 1551 HL 0.74. Slightly supratherapeutic- Will decrease infusion rate to heparin 2200  units/hr. Will recheck HL in 6 hours.   2/25 22:30 heparin level 0.71. Decrease to 2100 units/hr and recheck with AM labs.  2/26 AM heparin level 0.39. Continue current regimen. Recheck in 6 hours to confirm.  Thank you for this consult.  Gardner Candle, PharmD, BCPS Clinical Pharmacist 08/01/2016 4:30 PM

## 2016-08-01 NOTE — Progress Notes (Signed)
[facility] [place] [facility]   PATIENT NAME: Eugene Henderson    MR#:  [id number]  DATE OF BIRTH:  12-03-1950  SUBJECTIVE:  CHIEF COMPLAINT:   Chief Complaint  Patient presents with  . Shortness of Breath    Came with SOB, having Fluid overload, CHF- with a fib and RVR.   On Heparin drip, and metoprolol for HR control.  Orthopnea improving  REVIEW OF SYSTEMS:  CONSTITUTIONAL: No fever, fatigue or weakness.  EYES: No blurred or double vision.  EARS, NOSE, AND THROAT: No tinnitus or ear pain.  RESPIRATORY: No cough, positive for shortness of breath, wheezing or hemoptysis.  CARDIOVASCULAR: No chest pain, orthopnea, edema.  GASTROINTESTINAL: No nausea, vomiting, diarrhea or abdominal pain.  GENITOURINARY: No dysuria, hematuria.  ENDOCRINE: No polyuria, nocturia,  HEMATOLOGY: No anemia, easy bruising or bleeding SKIN: No rash or lesion. MUSCULOSKELETAL: No joint pain or arthritis.   NEUROLOGIC: No tingling, numbness, weakness.  PSYCHIATRY: No anxiety or depression.   ROS  DRUG ALLERGIES:  No Known Allergies  VITALS:  Blood pressure 119/65, pulse 78, temperature 97.8 F (36.6 C), temperature source Oral, resp. rate 20, height 6' (1.829 m), weight 115.3 kg (254 lb 3.2 oz), SpO2 97 %.  PHYSICAL EXAMINATION:  GENERAL:  66 y.o.-year-old patient lying in the bed with no acute distress.  EYES: Pupils equal, round, reactive to light and accommodation. No scleral icterus. Extraocular muscles intact.  HEENT: Head atraumatic, normocephalic. Oropharynx and nasopharynx clear.  NECK:  Supple, no jugular venous distention. No thyroid enlargement, no tenderness.  LUNGS: Normal breath sounds bilaterally, no wheezing, some crepitation. No use of accessory muscles of respiration.  CARDIOVASCULAR: S1, S2 fast. No murmurs, rubs, or gallops.  ABDOMEN: Soft, nontender, nondistended. Bowel sounds present. No organomegaly or mass.  EXTREMITIES: No pedal edema, cyanosis, or  clubbing.  NEUROLOGIC: Cranial nerves II through XII are intact. Muscle strength 5/5 in all extremities. Sensation intact. Gait not checked.  PSYCHIATRIC: The patient is alert and oriented x 3.  SKIN: No obvious rash, lesion, or ulcer.   Physical Exam LABORATORY PANEL:   CBC  Recent Labs Lab 08/01/16 0410  WBC 5.2  HGB 15.6  HCT 45.6  PLT 168   ------------------------------------------------------------------------------------------------------------------  Chemistries   Recent Labs Lab 08/01/16 0410  NA 134*  K 3.7  CL 97*  CO2 29  GLUCOSE 96  BUN 18  CREATININE 0.77  CALCIUM 9.1  MG 1.8   ------------------------------------------------------------------------------------------------------------------  Cardiac Enzymes  Recent Labs Lab 07/28/16 1652 07/28/16 2155  TROPONINI 0.04* 0.04*   ------------------------------------------------------------------------------------------------------------------  RADIOLOGY:  No results found.  ASSESSMENT AND PLAN:   Principal Problem:   Acute respiratory distress Active Problems:   Persistent atrial fibrillation (HCC)   Elevated troponin   Hyperthyroidism   Noncompliance with medications   Essential hypertension   Shortness of breath   Acute on chronic combined systolic and diastolic CHF (congestive heart failure) (HCC)  66 year old male with past medical history of hypertension, Osteoarthritis, hyperthyroidism who presented to the hospital due to shortness of breath.  1. Acute systolic CHF - Likely ischemic cardiomyopathy   25-30% EF   IV lasix   Appreciated cardiology input   On metoprolol.   ACE inhibitor on discharge.  2. Elevated troponin-likely supply demand ischemia secondary to atrial fibrillation/flutter.  - Remained stable on follow up troponin.    cath on Monday, for work up of cardiomyopathy.  3. Hyperthyroidism-patient's TSH was significantly low.   non-compliant with his meds.  resumed Methimazole.  - will need outpatient Endocrine follow up.  Repeat TSH still low  4. New Onset A. Fib/flutter - rate controlled.   Metoprolol.  - On telemetry, Heparin gtt, Cards consult, Echo.    Oral anti-coagulation after cath  5. HTN - cont. Lisinopril and  Metoprolol.   All the records are reviewed and case discussed with Care Management/Social Worker. Management plans discussed with the patient, family and they are in agreement.  CODE STATUS: Full.  TOTAL TIME TAKING CARE OF THIS PATIENT: 35 minutes.   POSSIBLE D/C IN 1-2 DAYS, DEPENDING ON CLINICAL CONDITION.  Milagros Loll R M.D on 08/01/2016   Between 7am to 6pm - Pager - [phone]  After 6pm go to [email or web address] - Social research officer, government  [facility]  Office  [phone]  CC: Primary care physician; No PCP Per Patient  Note: This dictation was prepared with Dragon dictation along with smaller phrase technology. Any transcriptional errors that result from this process are unintentional.

## 2016-08-01 NOTE — Progress Notes (Signed)
ANTICOAGULATION CONSULT NOTE - Initial Consult  Pharmacy Consult for Heparin drip Indication: atrial fibrillation  No Known Allergies  Patient Measurements: Height: 6' (182.9 cm) Weight: 290 lb (131.5 kg) IBW/kg (Calculated) : 77.6 Heparin Dosing Weight: 107.4 kg  Vital Signs: Temp: 98.1 F (36.7 C) (02/21 0749) BP: 138/108 (02/21 0800) Pulse Rate: 90 (02/21 0800)  Labs:  Recent Labs (last 2 labs)    Recent Labs  07/28/16 0901  HGB 14.3  HCT 42.2  PLT 159  APTT 25  LABPROT 13.9  INR 1.07  CREATININE 0.77  TROPONINI 0.05*      Estimated Creatinine Clearance: 129.2 mL/min (by C-G formula based on SCr of 0.77 mg/dL).   Medical History:     Past Medical History:  Diagnosis Date  . Arthritis   . Asthma   . Hypertension       Assessment: 66 yo male starting on heparin drip for AFib. Pt does not appear to be on anticoagulation as an outpt.   Hgb 14.3, Plt 159, INR 1.07, aPTT 25  Goal of Therapy:  Heparin level 0.3-0.7 units/ml Monitor platelets by anticoagulation protocol: Yes   Plan:  Heparin bolus 4000 units IV x1 then heparin drip at 1500 units/hr (=15 ml/hr) Heparin level 6h after start of drip CBC in AM  2/21 @ 1700 HL 0.27 -- will give 1600 unit bolus x 1 (16 unit/kg based on heparin Dw) then will increase infusion to 1700 units/hr and will recheck HL 2/22 @ 0200 (6 hours after start of new infusion).  2/22 02:00 heparin level 0.43. Continue current regimen and recheck in 6 hours to confirm.  2/22 0800 HL =0.34 therapeutic. This is the second therapeutic level however level has been declining 0.43>0.34 therefore I will recheck in 6 hours.  2/22 1400 HL= 1.04 Spoke with RN who states drip is running at 56ml/hr and pt reporting no signs of bleeding. Pt was called to confirm where the lab was drawn. initially he stated it was drawn from his left hand (IV in left forearm), but then stated it was his right. Asked if it was the  opposite of where the IV was and he stated yes. RN informed to hold heparin drip for 1 hour and resume at 1450 units/hr. Recheck in 6 hours.   2/22 2200 Heparin level <0.1 Per RN drip is running but had self-paused during shift. Will recheck with AM labs to confirm before making adjustments.  2/23 03:30 Repeat heparin level <0.10. Per RN line may not have been in pump properly and has been repositioned. Will go ahead and increase to 1700 units/hr on which patient was previously therapeutic and recheck in 6 hours. Will not bolus at this time.  2/23 1200 HL =0.29 Will increase rate to 1900 units/hr and recheck in 6 hours.  2/23: HL @ 20:00 = 0.36 Will continue this pt on current rate of 1900 units/hr and recheck HL in 6 hrs on 2/24 @ 0200.   2/24 02:00 heparin level 0.50. Continue current regimen. Recheck heparin level and CBC with tomorrow AM labs.  2/25 AM heparin level <0.1. Per RN patient had pulled out IV and new access was obtained with resumption of infusion at around 04:00. Will recheck heparin level at 10:00, ~6 hours after restart.  2/25 10:00 HL <0.1.  Ordered bolus of 3200 units and increased drip rate to 2300 units/hr. Will recheck level tonight at 1600.  Thank you for this consult.  Stormy Card Clinical Pharmacist 08/01/16

## 2016-08-01 NOTE — Plan of Care (Signed)
Problem: Pain Managment: Goal: General experience of comfort will improve Outcome: Progressing Pain significantly diminished. On heparin drip

## 2016-08-01 NOTE — Progress Notes (Signed)
ANTICOAGULATION CONSULT NOTE - Initial Consult  Pharmacy Consult for Heparin drip Indication: atrial fibrillation  No Known Allergies  Patient Measurements: Height: 6' (182.9 cm) Weight: 290 lb (131.5 kg) IBW/kg (Calculated) : 77.6 Heparin Dosing Weight: 107.4 kg  Vital Signs: Temp: 98.1 F (36.7 C) (02/21 0749) BP: 138/108 (02/21 0800) Pulse Rate: 90 (02/21 0800)  Labs:  Recent Labs (last 2 labs)    Recent Labs  07/28/16 0901  HGB 14.3  HCT 42.2  PLT 159  APTT 25  LABPROT 13.9  INR 1.07  CREATININE 0.77  TROPONINI 0.05*      Estimated Creatinine Clearance: 129.2 mL/min (by C-G formula based on SCr of 0.77 mg/dL).   Medical History:     Past Medical History:  Diagnosis Date  . Arthritis   . Asthma   . Hypertension       Assessment: 66 yo male starting on heparin drip for AFib. Pt does not appear to be on anticoagulation as an outpt.   Hgb 14.3, Plt 159, INR 1.07, aPTT 25  Goal of Therapy:  Heparin level 0.3-0.7 units/ml Monitor platelets by anticoagulation protocol: Yes   Plan:  Heparin bolus 4000 units IV x1 then heparin drip at 1500 units/hr (=15 ml/hr) Heparin level 6h after start of drip CBC in AM  2/21 @ 1700 HL 0.27 -- will give 1600 unit bolus x 1 (16 unit/kg based on heparin Dw) then will increase infusion to 1700 units/hr and will recheck HL 2/22 @ 0200 (6 hours after start of new infusion).  2/22 02:00 heparin level 0.43. Continue current regimen and recheck in 6 hours to confirm.  2/22 0800 HL =0.34 therapeutic. This is the second therapeutic level however level has been declining 0.43>0.34 therefore I will recheck in 6 hours.  2/22 1400 HL= 1.04 Spoke with RN who states drip is running at 36ml/hr and pt reporting no signs of bleeding. Pt was called to confirm where the lab was drawn. initially he stated it was drawn from his left hand (IV in left forearm), but then stated it was his right. Asked if it was the  opposite of where the IV was and he stated yes. RN informed to hold heparin drip for 1 hour and resume at 1450 units/hr. Recheck in 6 hours.   2/22 2200 Heparin level <0.1 Per RN drip is running but had self-paused during shift. Will recheck with AM labs to confirm before making adjustments.  2/23 03:30 Repeat heparin level <0.10. Per RN line may not have been in pump properly and has been repositioned. Will go ahead and increase to 1700 units/hr on which patient was previously therapeutic and recheck in 6 hours. Will not bolus at this time.  2/23 1200 HL =0.29 Will increase rate to 1900 units/hr and recheck in 6 hours.  2/23: HL @ 20:00 = 0.36 Will continue this pt on current rate of 1900 units/hr and recheck HL in 6 hrs on 2/24 @ 0200.   2/24 02:00 heparin level 0.50. Continue current regimen. Recheck heparin level and CBC with tomorrow AM labs.  2/25 AM heparin level <0.1. Per RN patient had pulled out IV and new access was obtained with resumption of infusion at around 04:00. Will recheck heparin level at 10:00, ~6 hours after restart.  Thank you for this consult.  Cannon Arreola S Clinical Pharmacist  08/01/16

## 2016-08-02 ENCOUNTER — Telehealth: Payer: Self-pay | Admitting: Cardiovascular Disease

## 2016-08-02 ENCOUNTER — Encounter: Payer: Self-pay | Admitting: Cardiovascular Disease

## 2016-08-02 ENCOUNTER — Encounter
Admission: EM | Disposition: A | Discharge status: Home Health | Payer: Self-pay | Source: Home / Self Care | Attending: Internal Medicine

## 2016-08-02 HISTORY — PX: RIGHT/LEFT HEART CATH AND CORONARY ANGIOGRAPHY: CATH118266

## 2016-08-02 LAB — CBC
HEMATOCRIT: 45.9 % (ref 40.0–52.0)
HEMOGLOBIN: 15.1 g/dL (ref 13.0–18.0)
MCH: 27 pg (ref 26.0–34.0)
MCHC: 32.8 g/dL (ref 32.0–36.0)
MCV: 82.2 fL (ref 80.0–100.0)
Platelets: 169 10*3/uL (ref 150–440)
RBC: 5.58 MIL/uL (ref 4.40–5.90)
RDW: 15.3 % — ABNORMAL HIGH (ref 11.5–14.5)
WBC: 6.8 10*3/uL (ref 3.8–10.6)

## 2016-08-02 LAB — HEPARIN LEVEL (UNFRACTIONATED): HEPARIN UNFRACTIONATED: 0.39 [IU]/mL (ref 0.30–0.70)

## 2016-08-02 SURGERY — RIGHT/LEFT HEART CATH AND CORONARY ANGIOGRAPHY
Anesthesia: Moderate Sedation

## 2016-08-02 MED ORDER — VERAPAMIL HCL 2.5 MG/ML IV SOLN
INTRAVENOUS | Status: AC
  Filled 2016-08-02: qty 2

## 2016-08-02 MED ORDER — IOPAMIDOL (ISOVUE-300) INJECTION 61%
INTRAVENOUS | Status: DC | PRN
  Administered 2016-08-02: 70 mL via INTRA_ARTERIAL

## 2016-08-02 MED ORDER — HEPARIN SODIUM (PORCINE) 1000 UNIT/ML IJ SOLN
INTRAMUSCULAR | Status: DC | PRN
  Administered 2016-08-02: 5000 [IU] via INTRAVENOUS

## 2016-08-02 MED ORDER — SODIUM CHLORIDE 0.9 % IV SOLN: 250.0000 mL | INTRAVENOUS | Status: DC | PRN

## 2016-08-02 MED ORDER — MIDAZOLAM HCL 2 MG/2ML IJ SOLN
INTRAMUSCULAR | Status: AC
  Filled 2016-08-02: qty 2

## 2016-08-02 MED ORDER — SODIUM CHLORIDE 0.9% FLUSH: 3.0000 mL | INTRAVENOUS | Status: DC | PRN

## 2016-08-02 MED ORDER — METHIMAZOLE 10 MG PO TABS: 20.0000 mg | ORAL_TABLET | Freq: Two times a day (BID) | ORAL | 0 refills | Status: DC

## 2016-08-02 MED ORDER — FENTANYL CITRATE (PF) 100 MCG/2ML IJ SOLN
INTRAMUSCULAR | Status: DC | PRN
  Administered 2016-08-02: 50 ug via INTRAVENOUS
  Administered 2016-08-02: 25 ug via INTRAVENOUS

## 2016-08-02 MED ORDER — SODIUM CHLORIDE 0.9% FLUSH
3.0000 mL | Freq: Two times a day (BID) | INTRAVENOUS | Status: DC
  Administered 2016-08-02: 3 mL via INTRAVENOUS

## 2016-08-02 MED ORDER — LOSARTAN POTASSIUM 25 MG PO TABS: 25.0000 mg | ORAL_TABLET | Freq: Every day | ORAL | 0 refills | Status: DC

## 2016-08-02 MED ORDER — METOPROLOL SUCCINATE ER 100 MG PO TB24
200.0000 mg | ORAL_TABLET | Freq: Every day | ORAL | Status: DC
  Administered 2016-08-03 – 2016-08-04 (×2): 200 mg via ORAL
  Filled 2016-08-02 (×2): qty 2

## 2016-08-02 MED ORDER — HEPARIN SODIUM (PORCINE) 1000 UNIT/ML IJ SOLN
INTRAMUSCULAR | Status: AC
  Filled 2016-08-02: qty 1

## 2016-08-02 MED ORDER — APIXABAN 5 MG PO TABS
5.0000 mg | ORAL_TABLET | Freq: Two times a day (BID) | ORAL | Status: DC
  Administered 2016-08-03 – 2016-08-04 (×3): 5 mg via ORAL
  Filled 2016-08-02 (×3): qty 1

## 2016-08-02 MED ORDER — ASPIRIN 81 MG PO CHEW: 81.0000 mg | CHEWABLE_TABLET | ORAL | Status: DC

## 2016-08-02 MED ORDER — APIXABAN 5 MG PO TABS: 5.0000 mg | ORAL_TABLET | Freq: Two times a day (BID) | ORAL | 0 refills | Status: DC

## 2016-08-02 MED ORDER — FENTANYL CITRATE (PF) 100 MCG/2ML IJ SOLN
INTRAMUSCULAR | Status: AC
  Filled 2016-08-02: qty 2

## 2016-08-02 MED ORDER — FUROSEMIDE 40 MG PO TABS: 40.0000 mg | ORAL_TABLET | Freq: Two times a day (BID) | ORAL | 0 refills | Status: DC

## 2016-08-02 MED ORDER — MIDAZOLAM HCL 2 MG/2ML IJ SOLN
INTRAMUSCULAR | Status: DC | PRN
  Administered 2016-08-02 (×2): 1 mg via INTRAVENOUS

## 2016-08-02 MED ORDER — HEPARIN (PORCINE) IN NACL 2-0.9 UNIT/ML-% IJ SOLN
INTRAMUSCULAR | Status: AC
  Filled 2016-08-02: qty 1000

## 2016-08-02 MED ORDER — SODIUM CHLORIDE 0.9% FLUSH: 3.0000 mL | Freq: Two times a day (BID) | INTRAVENOUS | Status: DC

## 2016-08-02 MED ORDER — MAGNESIUM OXIDE 400 (241.3 MG) MG PO TABS: 400.0000 mg | ORAL_TABLET | Freq: Every day | ORAL | 0 refills | Status: DC

## 2016-08-02 MED ORDER — FUROSEMIDE 40 MG PO TABS
40.0000 mg | ORAL_TABLET | Freq: Two times a day (BID) | ORAL | Status: DC
  Administered 2016-08-02 – 2016-08-04 (×4): 40 mg via ORAL
  Filled 2016-08-02 (×4): qty 1

## 2016-08-02 MED ORDER — METOPROLOL SUCCINATE ER 200 MG PO TB24: 200.0000 mg | ORAL_TABLET | Freq: Every day | ORAL | 0 refills | Status: DC

## 2016-08-02 MED ORDER — SODIUM CHLORIDE 0.9 % IV SOLN: INTRAVENOUS | Status: DC

## 2016-08-02 SURGICAL SUPPLY — 14 items
CATH 5FR JL4 DIAGNOSTIC (CATHETERS) ×3 IMPLANT
CATH INFINITI 5 FR JL3.5 (CATHETERS) ×3 IMPLANT
CATH INFINITI 5FR JL5 (CATHETERS) ×3 IMPLANT
CATH OPTITORQUE JACKY 4.0 5F (CATHETERS) ×3 IMPLANT
CATH SWANZ 7F THERMO (CATHETERS) ×3 IMPLANT
DEVICE RAD TR BAND REGULAR (VASCULAR PRODUCTS) ×3 IMPLANT
GLIDESHEATH SLEND SS 6F .021 (SHEATH) ×6 IMPLANT
GUIDEWIRE EMER 3M J .025X150CM (WIRE) ×3 IMPLANT
KIT MANI 3VAL PERCEP (MISCELLANEOUS) ×3 IMPLANT
KIT RIGHT HEART (MISCELLANEOUS) ×3 IMPLANT
NEEDLE PERC 18GX7CM (NEEDLE) ×3 IMPLANT
PACK CARDIAC CATH (CUSTOM PROCEDURE TRAY) ×3 IMPLANT
SHEATH PINNACLE 7F 10CM (SHEATH) ×3 IMPLANT
WIRE ROSEN-J .035X260CM (WIRE) ×3 IMPLANT

## 2016-08-02 NOTE — Care Management (Signed)
After over one hour pon the phone with insurance company, [facility] informed CM that patient has not out of network benefits for home health.  CM has contacted insurance to find out which agency is in network and  never reached an actual person. Spoke directly with patient's daughter Toniann Fail regarding home health, the priority of [place] medicaid and signing up for PCP in [place] on [address]..  Strongly encouraged to keep appointment with heart failure clinic and to call clinic for questions related to his heart failure treatment.

## 2016-08-02 NOTE — Progress Notes (Signed)
Patient started hyperventilating and sweating, BP 135/95, HR 58, Dr suduini notified, discharge on hold , will continue to monitor

## 2016-08-02 NOTE — Telephone Encounter (Signed)
Pt currently admitted.

## 2016-08-02 NOTE — Telephone Encounter (Signed)
TCM ph [facility] 2 week fu cath   Schedule dappt with Arida 08/16/16 3 pm

## 2016-08-02 NOTE — Care Management Important Message (Signed)
Important Message  Patient Details  Name: Eugene Henderson MRN: [id number] Date of Birth: [birth year]   Medicare Important Message Given:  Yes    Eber Hong, RN 08/02/2016, 5:13 PM

## 2016-08-02 NOTE — Care Management (Addendum)
Patient to discharge home on Eliquis. He says she has never been on this medication.  Provided with 30 day coupon.  He is agreeable to home health SN and no agency preference.  Referral to [facility] for SN and SW .

## 2016-08-02 NOTE — Discharge Instructions (Addendum)
Heart Failure Heart failure means your heart has trouble pumping blood. This makes it hard for your body to work well. Heart failure is usually a long-term (chronic) condition. You must take good care of yourself and follow your doctor's treatment plan. Follow these instructions at home:  Take your heart medicine as told by your doctor.  Do not stop taking medicine unless your doctor tells you to.  Do not skip any dose of medicine.  Refill your medicines before they run out.  Take other medicines only as told by your doctor or pharmacist.  Stay active if told by your doctor. The elderly and people with severe heart failure should talk with a doctor about physical activity.  Eat heart-healthy foods. Choose foods that are without trans fat and are low in saturated fat, cholesterol, and salt (sodium). This includes fresh or frozen fruits and vegetables, fish, lean meats, fat-free or low-fat dairy foods, whole grains, and high-fiber foods. Lentils and dried peas and beans (legumes) are also good choices.  Limit salt if told by your doctor.  Cook in a healthy way. Roast, grill, broil, bake, poach, steam, or stir-fry foods.  Limit fluids as told by your doctor.  Weigh yourself every morning. Do this after you pee (urinate) and before you eat breakfast. Write down your weight to give to your doctor.  Take your blood pressure and write it down if your doctor tells you to.  Ask your doctor how to check your pulse. Check your pulse as told.  Lose weight if told by your doctor.  Stop smoking or chewing tobacco. Do not use gum or patches that help you quit without your doctor's approval.  Schedule and go to doctor visits as told.  Nonpregnant women should have no more than 1 drink a day. Men should have no more than 2 drinks a day. Talk to your doctor about drinking alcohol.  Stop illegal drug use.  Stay current with shots (immunizations).  Manage your health conditions as told by your  doctor.  Learn to manage your stress.  Rest when you are tired.  If it is really hot outside:  Avoid intense activities.  Use air conditioning or fans, or get in a cooler place.  Avoid caffeine and alcohol.  Wear loose-fitting, lightweight, and light-colored clothing.  If it is really cold outside:  Avoid intense activities.  Layer your clothing.  Wear mittens or gloves, a hat, and a scarf when going outside.  Avoid alcohol.  Learn about heart failure and get support as needed.  Get help to maintain or improve your quality of life and your ability to care for yourself as needed. Contact a doctor if:  You gain weight quickly.  You are more short of breath than usual.  You cannot do your normal activities.  You tire easily.  You cough more than normal, especially with activity.  You have any or more puffiness (swelling) in areas such as your hands, feet, ankles, or belly (abdomen).  You cannot sleep because it is hard to breathe.  You feel like your heart is beating fast (palpitations).  You get dizzy or light-headed when you stand up. Get help right away if:  You have trouble breathing.  There is a change in mental status, such as becoming less alert or not being able to focus.  You have chest pain or discomfort.  You faint. This information is not intended to replace advice given to you by your health care provider. Make sure you  discuss any questions you have with your health care provider. Document Released: 03/02/2008 Document Revised: 10/30/2015 Document Reviewed: 07/10/2012 Elsevier Interactive Patient Education  2017 [employer] Femoral Site Care Refer to this sheet in the next few weeks. These instructions provide you with information about caring for yourself after your procedure. Your health care provider may also give you more specific instructions. Your treatment has been planned according to current medical practices, but problems sometimes  occur. Call your health care provider if you have any problems or questions after your procedure. What can I expect after the procedure? After your procedure, it is typical to have the following:  Bruising at the site that usually fades within 1-2 weeks.  Blood collecting in the tissue (hematoma) that may be painful to the touch. It should usually decrease in size and tenderness within 1-2 weeks. Follow these instructions at home:  Take medicines only as directed by your health care provider.  You may shower 24-48 hours after the procedure or as directed by your health care provider. Remove the bandage (dressing) and gently wash the site with plain soap and water. Pat the area dry with a clean towel. Do not rub the site, because this may cause bleeding.  Do not take baths, swim, or use a hot tub until your health care provider approves.  Check your insertion site every day for redness, swelling, or drainage.  Do not apply powder or lotion to the site.  Limit use of stairs to twice a day for the first 2-3 days or as directed by your health care provider.  Do not squat for the first 2-3 days or as directed by your health care provider.  Do not lift over 10 lb (4.5 kg) for 5 days after your procedure or as directed by your health care provider.  Ask your health care provider when it is okay to:  Return to work or school.  Resume usual physical activities or sports.  Resume sexual activity.  Do not drive home if you are discharged the same day as the procedure. Have someone else drive you.  You may drive 24 hours after the procedure unless otherwise instructed by your health care provider.  Do not operate machinery or power tools for 24 hours after the procedure or as directed by your health care provider.  If your procedure was done as an outpatient procedure, which means that you went home the same day as your procedure, a responsible adult should be with you for the first 24  hours after you arrive home.  Keep all follow-up visits as directed by your health care provider. This is important. Contact a health care provider if:  You have a fever.  You have chills.  You have increased bleeding from the site. Hold pressure on the site. Get help right away if:  You have unusual pain at the site.  You have redness, warmth, or swelling at the site.  You have drainage (other than a small amount of blood on the dressing) from the site.  The site is bleeding, and the bleeding does not stop after 30 minutes of holding steady pressure on the site.  Your leg or foot becomes pale, cool, tingly, or numb. This information is not intended to replace advice given to you by your health care provider. Make sure you discuss any questions you have with your health care provider. Document Released: 01/25/2014 Document Revised: 10/30/2015 Document Reviewed: 12/11/2013 Elsevier Interactive Patient Education  2017 [employer].  -  Daily fluids < 2 liters. - Low salt diet - Check weight everyday and keep log. Take to your doctors appt. - Take extra dose of lasix if you gain more than 3 pounds weight.      Apixaban oral tablets What is this medicine? APIXABAN (a PIX a ban) is an anticoagulant (blood thinner). It is used to lower the chance of stroke in people with a medical condition called atrial fibrillation. It is also used to treat or prevent blood clots in the lungs or in the veins. COMMON BRAND NAME(S): Eliquis What should I tell my health care provider before I take this medicine? They need to know if you have any of these conditions: -bleeding disorders -bleeding in the brain -blood in your stools (black or tarry stools) or if you have blood in your vomit -history of stomach bleeding -kidney disease -liver disease -mechanical heart valve -an unusual or allergic reaction to apixaban, other medicines, foods, dyes, or preservatives -pregnant or trying to get  pregnant -breast-feeding How should I use this medicine? Take this medicine by mouth with a glass of water. Follow the directions on the prescription label. You can take it with or without food. If it upsets your stomach, take it with food. Take your medicine at regular intervals. Do not take it more often than directed. Do not stop taking except on your doctor's advice. Stopping this medicine may increase your risk of a blot clot. Be sure to refill your prescription before you run out of medicine. Talk to your pediatrician regarding the use of this medicine in children. Special care may be needed. What if I miss a dose? If you miss a dose, take it as soon as you can. If it is almost time for your next dose, take only that dose. Do not take double or extra doses. What may interact with this medicine? This medicine may interact with the following: -aspirin and aspirin-like medicines -certain medicines for fungal infections like ketoconazole and itraconazole -certain medicines for seizures like carbamazepine and phenytoin -certain medicines that treat or prevent blood clots like warfarin, enoxaparin, and dalteparin -clarithromycin -NSAIDs, medicines for pain and inflammation, like ibuprofen or naproxen -rifampin -ritonavir -St. John's wort What should I watch for while using this medicine? Notify your doctor or health care professional and seek emergency treatment if you develop breathing problems; changes in vision; chest pain; severe, sudden headache; pain, swelling, warmth in the leg; trouble speaking; sudden numbness or weakness of the face, arm, or leg. These can be signs that your condition has gotten worse. If you are going to have surgery, tell your doctor or health care professional that you are taking this medicine. Tell your health care professional that you use this medicine before you have a spinal or epidural procedure. Sometimes people who take this medicine have bleeding problems  around the spine when they have a spinal or epidural procedure. This bleeding is very rare. If you have a spinal or epidural procedure while on this medicine, call your health care professional immediately if you have back pain, numbness or tingling (especially in your legs and feet), muscle weakness, paralysis, or loss of bladder or bowel control. Avoid sports and activities that might cause injury while you are using this medicine. Severe falls or injuries can cause unseen bleeding. Be careful when using sharp tools or knives. Consider using an Neurosurgeon. Take special care brushing or flossing your teeth. Report any injuries, bruising, or red spots on the skin to your  doctor or health care professional. What side effects may I notice from receiving this medicine? Side effects that you should report to your doctor or health care professional as soon as possible: -allergic reactions like skin rash, itching or hives, swelling of the face, lips, or tongue -signs and symptoms of bleeding such as bloody or black, tarry stools; red or dark-brown urine; spitting up blood or brown material that looks like coffee grounds; red spots on the skin; unusual bruising or bleeding from the eye, gums, or nose Where should I keep my medicine? Keep out of the reach of children. Store at room temperature between 20 and 25 degrees C (68 and 77 degrees F). Throw away any unused medicine after the expiration date.  2017 Elsevier/Gold Standard (2015-06-26 09:26:49)   Heart Disease Prevention Heart disease is a leading cause of death. There are many things you can do to help prevent heart disease. Be physically active Physical activity is good for your heart. It helps control your blood pressure, cholesterol levels, and weight. Try to be physically active every day. Ask your health care provider what activities are best for you. Be a healthy weight Extra weight can strain your heart and affect your blood pressure and  cholesterol levels. Lose weight with diet and exercise if recommended by your health care provider. Eat heart-healthy foods Follow a healthy eating plan as recommended by your health care provider or dietitian. Heart-healthy foods include:  High-fiber foods. These include oat bran, oatmeal, and whole-grain breads and cereals.  Fruits and vegetables. Avoid:  Alcohol.  Fried foods.  Foods high in saturated fat. These include meats, butter, whole dairy products, shortening, and coconut or palm oil.  Salty foods. These include canned food, luncheon meat, salty snacks, and fast food. Keep your cholesterol levels under control Cholesterol is a substance that is used for many important functions. When your cholesterol levels are high, cholesterol can stick to the insides of your blood vessels, making them narrow or clog. This can lead to chest pain (angina) and a heart attack. Keep your cholesterol levels under control as recommended by your health care provider. Have your cholesterol checked at least once a year. Target cholesterol levels (in mg/dL) for most people are:  Total cholesterol below 200.  LDL cholesterol below 100.  HDL cholesterol above 40 in men and above 50 in women.  Triglycerides below 150. Keep your blood pressure under control Having high blood pressure (hypertension) puts you at risk for stroke and other forms of heart disease. Keep your blood pressure under control as recommended by your health care provider. Ask your health care provider if you need treatment to lower your blood pressure. If you are 31-72 years of age, have your blood pressure checked every 3-5 years. If you are 9 years of age or older, have your blood pressure checked every year. Do not use tobacco products Tobacco smoke can damage your heart and blood vessels. Do not use any tobacco products including cigarettes, chewing tobacco, or electronic cigarettes. If you need help quitting, ask your health  care provider. Take medicines as directed Take medicines only as directed by your health care provider. Ask your health care provider whether you should take an aspirin every day. Taking aspirin can help reduce your risk of heart disease and stroke. Where to find more information: To find out more about heart disease, visit the American Heart Association's website at www.americanheart.org This information is not intended to replace advice given to you by your  health care provider. Make sure you discuss any questions you have with your health care provider. Document Released: 01/06/2004 Document Revised: 10/22/2015 Document Reviewed: 07/18/2013 Elsevier Interactive Patient Education  2017 Else[employer] Inc.   Hypertension Hypertension, commonly called high blood pressure, is when the force of blood pumping through your arteries is too strong. Your arteries are the blood vessels that carry blood from your heart throughout your body. A blood pressure reading consists of a higher number over a lower number, such as 110/72. The higher number (systolic) is the pressure inside your arteries when your heart pumps. The lower number (diastolic) is the pressure inside your arteries when your heart relaxes. Ideally you want your blood pressure below 120/80. Hypertension forces your heart to work harder to pump blood. Your arteries may become narrow or stiff. Having untreated or uncontrolled hypertension can cause heart attack, stroke, kidney disease, and other problems. What increases the risk? Some risk factors for high blood pressure are controllable. Others are not. Risk factors you cannot control include:  Race. You may be at higher risk if you are African American.  Age. Risk increases with age.  Gender. Men are at higher risk than women before age 31 years. After age 36, women are at higher risk than men. Risk factors you can control include:  Not getting enough exercise or physical activity.  Being  overweight.  Getting too much fat, sugar, calories, or salt in your diet.  Drinking too much alcohol. What are the signs or symptoms? Hypertension does not usually cause signs or symptoms. Extremely high blood pressure (hypertensive crisis) may cause headache, anxiety, shortness of breath, and nosebleed. How is this diagnosed? To check if you have hypertension, your health care provider will measure your blood pressure while you are seated, with your arm held at the level of your heart. It should be measured at least twice using the same arm. Certain conditions can cause a difference in blood pressure between your right and left arms. A blood pressure reading that is higher than normal on one occasion does not mean that you need treatment. If it is not clear whether you have high blood pressure, you may be asked to return on a different day to have your blood pressure checked again. Or, you may be asked to monitor your blood pressure at home for 1 or more weeks. How is this treated? Treating high blood pressure includes making lifestyle changes and possibly taking medicine. Living a healthy lifestyle can help lower high blood pressure. You may need to change some of your habits. Lifestyle changes may include:  Following the DASH diet. This diet is high in fruits, vegetables, and whole grains. It is low in salt, red meat, and added sugars.  Keep your sodium intake below 2,300 mg per day.  Getting at least 30-45 minutes of aerobic exercise at least 4 times per week.  Losing weight if necessary.  Not smoking.  Limiting alcoholic beverages.  Learning ways to reduce stress. Your health care provider may prescribe medicine if lifestyle changes are not enough to get your blood pressure under control, and if one of the following is true:  You are 21-53 years of age and your systolic blood pressure is above 140.  You are 46 years of age or older, and your systolic blood pressure is above  150.  Your diastolic blood pressure is above 90.  You have diabetes, and your systolic blood pressure is over 140 or your diastolic blood pressure is over  90.  You have kidney disease and your blood pressure is above 140/90.  You have heart disease and your blood pressure is above 140/90. Your personal target blood pressure may vary depending on your medical conditions, your age, and other factors. Follow these instructions at home:  Have your blood pressure rechecked as directed by your health care provider.  Take medicines only as directed by your health care provider. Follow the directions carefully. Blood pressure medicines must be taken as prescribed. The medicine does not work as well when you skip doses. Skipping doses also puts you at risk for problems.  Do not smoke.  Monitor your blood pressure at home as directed by your health care provider. Contact a health care provider if:  You think you are having a reaction to medicines taken.  You have recurrent headaches or feel dizzy.  You have swelling in your ankles.  You have trouble with your vision. Get help right away if:  You develop a severe headache or confusion.  You have unusual weakness, numbness, or feel faint.  You have severe chest or abdominal pain.  You vomit repeatedly.  You have trouble breathing. This information is not intended to replace advice given to you by your health care provider. Make sure you discuss any questions you have with your health care provider. Document Released: 05/24/2005 Document Revised: 10/30/2015 Document Reviewed: 03/16/2013 Elsevier Interactive Patient Education  2017 [employer] Inc. Digoxin tablets or capsules What is this medicine? DIGOXIN (di JOX in) is used to treat congestive heart failure and heart rhythm problems. This medicine may be used for other purposes; ask your health care provider or pharmacist if you have questions. COMMON BRAND NAME(S): Digitek,  Lanoxicaps, Lanoxin What should I tell my health care provider before I take this medicine? They need to know if you have any of these conditions: -certain heart rhythm disorders -heart disease or recent heart attack -kidney or liver disease -an unusual or allergic reaction to digoxin, other medicines, foods, dyes, or preservatives -pregnant or trying to get pregnant -breast-feeding How should I use this medicine? Take this medicine by mouth with a glass of water. Follow the directions on the prescription label. Take your doses at regular intervals. Do not take your medicine more often than directed. Talk to your pediatrician regarding the use of this medicine in children. Special care may be needed. Overdosage: If you think you have taken too much of this medicine contact a poison control center or emergency room at once. NOTE: This medicine is only for you. Do not share this medicine with others. What if I miss a dose? If you miss a dose, take it as soon as you can. If it is almost time for your next dose, take only that dose. Do not take double or extra doses. What may interact with this medicine? -activated charcoal -albuterol -alprazolam -antacids -antiviral medicines for HIV or AIDS like ritonavir and saquinavir -calcium -certain antibiotics like azithromycin, clarithromycin, erythromycin, gentamicin, neomycin, trimethoprim, and tetracycline -certain medicines for blood pressure, heart disease, irregular heart beat -certain medicines for cancer -certain medicines for cholesterol like atorvastatin, cholestyramine, and colestipol -certain medicines for diabetes, like acarbose, exenatide, miglitol, and metformin -certain medicines for fungal infections like ketoconazole and itraconazole -certain medicines for stomach problems like omeprazole, esomeprazole, lansoprazole, rabeprazole, metoclopramide, and sucralfate -cyclosporine -diphenoxylate -epinephrine -kaolin;  pectin -nefazodone -NSAIDS, medicines for pain and inflammation, like celecoxib, ibuprofen, or naproxen -penicillamine -phenytoin -propantheline -quinine -phenytoin -rifampin -succinylcholine -St. John's Wort -sulfasalazine -teriparatide -thyroid  hormones -tolvaptan This list may not describe all possible interactions. Give your health care provider a list of all the medicines, herbs, non-prescription drugs, or dietary supplements you use. Also tell them if you smoke, drink alcohol, or use illegal drugs. Some items may interact with your medicine. What should I watch for while using this medicine? Visit your doctor or health care professional for regular checks on your progress. Do not stop taking this medicine without the advice of your doctor or health care professional, even if you feel better. Do not change the brand you are taking, other brands may affect you differently. Check your heart rate and blood pressure regularly while you are taking this medicine. Ask your doctor or health care professional what your heart rate and blood pressure should be, and when you should contact him or her. Your doctor or health care professional also may schedule regular blood tests and electrocardiograms to check your progress. Watch your diet. Less digoxin may be absorbed from the stomach if you have a diet high in bran fiber. Do not treat yourself for coughs, colds or allergies without asking your doctor or health care professional for advice. Some ingredients can increase possible side effects. What side effects may I notice from receiving this medicine? Side effects that you should report to your doctor or health care professional as soon as possible: -allergic reactions like skin rash, itching or hives, swelling of the face, lips, or tongue -changes in behavior, mood, or mental ability -changes in vision -confusion -fast, irregular heartbeat -feeling faint or lightheaded,  falls -headache -nausea, vomiting -unusual bleeding, bruising -unusually weak or tired Side effects that usually do not require medical attention (report to your doctor or health care professional if they continue or are bothersome): -breast enlargement in men and women -diarrhea This list may not describe all possible side effects. Call your doctor for medical advice about side effects. You may report side effects to FDA at 1-800-FDA-1088. Where should I keep my medicine? Keep out of the reach of children. Store at room temperature between 15 and 30 degrees C (59 and 86 degrees F). Protect from light and moisture. Throw away any unused medicine after the expiration date. NOTE: This sheet is a summary. It may not cover all possible information. If you have questions about this medicine, talk to your doctor, pharmacist, or health care provider.  2017 Elsevier/Gold Standard (2012-08-03 12:40:27) Apixaban oral tablets What is this medicine? APIXABAN (a PIX a ban) is an anticoagulant (blood thinner). It is used to lower the chance of stroke in people with a medical condition called atrial fibrillation. It is also used to treat or prevent blood clots in the lungs or in the veins. COMMON BRAND NAME(S): Eliquis What should I tell my health care provider before I take this medicine? They need to know if you have any of these conditions: -bleeding disorders -bleeding in the brain -blood in your stools (black or tarry stools) or if you have blood in your vomit -history of stomach bleeding -kidney disease -liver disease -mechanical heart valve -an unusual or allergic reaction to apixaban, other medicines, foods, dyes, or preservatives -pregnant or trying to get pregnant -breast-feeding How should I use this medicine? Take this medicine by mouth with a glass of water. Follow the directions on the prescription label. You can take it with or without food. If it upsets your stomach, take it with  food. Take your medicine at regular intervals. Do not take it more often than  directed. Do not stop taking except on your doctor's advice. Stopping this medicine may increase your risk of a blot clot. Be sure to refill your prescription before you run out of medicine. Talk to your pediatrician regarding the use of this medicine in children. Special care may be needed. What if I miss a dose? If you miss a dose, take it as soon as you can. If it is almost time for your next dose, take only that dose. Do not take double or extra doses. What may interact with this medicine? This medicine may interact with the following: -aspirin and aspirin-like medicines -certain medicines for fungal infections like ketoconazole and itraconazole -certain medicines for seizures like carbamazepine and phenytoin -certain medicines that treat or prevent blood clots like warfarin, enoxaparin, and dalteparin -clarithromycin -NSAIDs, medicines for pain and inflammation, like ibuprofen or naproxen -rifampin -ritonavir -St. John's wort What should I watch for while using this medicine? Notify your doctor or health care professional and seek emergency treatment if you develop breathing problems; changes in vision; chest pain; severe, sudden headache; pain, swelling, warmth in the leg; trouble speaking; sudden numbness or weakness of the face, arm, or leg. These can be signs that your condition has gotten worse. If you are going to have surgery, tell your doctor or health care professional that you are taking this medicine. Tell your health care professional that you use this medicine before you have a spinal or epidural procedure. Sometimes people who take this medicine have bleeding problems around the spine when they have a spinal or epidural procedure. This bleeding is very rare. If you have a spinal or epidural procedure while on this medicine, call your health care professional immediately if you have back pain, numbness  or tingling (especially in your legs and feet), muscle weakness, paralysis, or loss of bladder or bowel control. Avoid sports and activities that might cause injury while you are using this medicine. Severe falls or injuries can cause unseen bleeding. Be careful when using sharp tools or knives. Consider using an Neurosurgeon. Take special care brushing or flossing your teeth. Report any injuries, bruising, or red spots on the skin to your doctor or health care professional. What side effects may I notice from receiving this medicine? Side effects that you should report to your doctor or health care professional as soon as possible: -allergic reactions like skin rash, itching or hives, swelling of the face, lips, or tongue -signs and symptoms of bleeding such as bloody or black, tarry stools; red or dark-brown urine; spitting up blood or brown material that looks like coffee grounds; red spots on the skin; unusual bruising or bleeding from the eye, gums, or nose Where should I keep my medicine? Keep out of the reach of children. Store at room temperature between 20 and 25 degrees C (68 and 77 degrees F). Throw away any unused medicine after the expiration date.  2017 Elsevier/Gold Standard (2015-06-26 16:10:96) [facility] appointment on August 10, 2016 at 11:30am with Clarisa Kindred, FNP. Please call [phone] to reschedule.    - Daily fluids < 2 liters. - Low salt diet - Check weight everyday and keep log. Take to your doctors appt. - Take extra dose of lasix if you gain more than 3 pounds weight.   Information on my medicine - ELIQUIS (apixaban)  This medication education was reviewed with me or my healthcare representative as part of my discharge preparation.  The pharmacist that spoke with me during my hospital  stay was:  Soumoungoun Toure, RN  Why was Eliquis prescribed for you? Eliquis was prescribed for you to reduce the risk of a blood clot forming that can cause a  stroke if you have a medical condition called atrial fibrillation (a type of irregular heartbeat).  What do You need to know about Eliquis ? Take your Eliquis TWICE DAILY - one tablet in the morning and one tablet in the evening with or without food. If you have difficulty swallowing the tablet whole please discuss with your pharmacist how to take the medication safely.  Take Eliquis exactly as prescribed by your doctor and DO NOT stop taking Eliquis without talking to the doctor who prescribed the medication.  Stopping may increase your risk of developing a stroke.  Refill your prescription before you run out.  After discharge, you should have regular check-up appointments with your healthcare provider that is prescribing your Eliquis.  In the future your dose may need to be changed if your kidney function or weight changes by a significant amount or as you get older.  What do you do if you miss a dose? If you miss a dose, take it as soon as you remember on the same day and resume taking twice daily.  Do not take more than one dose of ELIQUIS at the same time to make up a missed dose.  Important Safety Information A possible side effect of Eliquis is bleeding. You should call your healthcare provider right away if you experience any of the following: ? Bleeding from an injury or your nose that does not stop. ? Unusual colored urine (red or dark brown) or unusual colored stools (red or black). ? Unusual bruising for unknown reasons. ? A serious fall or if you hit your head (even if there is no bleeding).  Some medicines may interact with Eliquis and might increase your risk of bleeding or clotting while on Eliquis. To help avoid this, consult your healthcare provider or pharmacist prior to using any new prescription or non-prescription medications, including herbals, vitamins, non-steroidal anti-inflammatory drugs (NSAIDs) and supplements.  This website has more information on Eliquis  (apixaban): http://www.eliquis.com/eliquis/home

## 2016-08-02 NOTE — Progress Notes (Signed)
Patient alert and oriented, denies any pain at this time, patient being transported to cath lab

## 2016-08-02 NOTE — Progress Notes (Signed)
Patient Name: Eugene Henderson Date of Encounter: 08/02/2016  Primary Cardiologist: [facility] Problem List     Principal Problem:   Acute respiratory distress Active Problems:   Persistent atrial fibrillation (HCC)   Elevated troponin   Hyperthyroidism   Noncompliance with medications   Essential hypertension   Shortness of breath   Acute on chronic combined systolic and diastolic CHF (congestive heart failure) (HCC)     Subjective   No chest pain. Improved SOB. Able to lay supine without issues. He is for cardiac cath this morning. Net - 6 L for the admission. Weight down 37 pounds for the admission to date.   Inpatient Medications    Scheduled Meds: . furosemide  80 mg Intravenous Q12H  . losartan  25 mg Oral Daily  . magnesium oxide  400 mg Oral Daily  . methimazole  20 mg Oral TID  . metoprolol succinate  100 mg Oral Daily  . pneumococcal 23 valent vaccine  0.5 mL Intramuscular Tomorrow-1000  . potassium chloride  20 mEq Oral Once  . sodium chloride flush  3 mL Intravenous Q12H  . sodium chloride flush  3 mL Intravenous Q12H  . sodium chloride flush  3 mL Intravenous Q12H   Continuous Infusions: . sodium chloride 70 mL/hr at 08/02/16 0445  . sodium chloride 10 mL/hr at 08/02/16 0540  . heparin 2,100 Units/hr (08/02/16 0018)   PRN Meds: sodium chloride, sodium chloride, acetaminophen **OR** acetaminophen, LORazepam, ondansetron **OR** ondansetron (ZOFRAN) IV, sodium chloride flush, sodium chloride flush, traMADol   Vital Signs    Vitals:   08/01/16 1514 08/01/16 1933 08/01/16 2000 08/02/16 0451  BP:  (!) 137/99 130/80 130/87  Pulse:  (!) 101  75  Resp:  18  18  Temp:  98 F (36.7 C)  98.1 F (36.7 C)  TempSrc:  Oral  Oral  SpO2:  96%  98%  Weight: 253 lb 3.2 oz (114.9 kg)   253 lb 6.4 oz (114.9 kg)  Height:        Intake/Output Summary (Last 24 hours) at 08/02/16 0743 Last data filed at 08/02/16 0728  Gross per 24 hour  Intake               876 ml  Output             1950 ml  Net            -1074 ml   Filed Weights   08/01/16 0500 08/01/16 1514 08/02/16 0451  Weight: 254 lb 3.2 oz (115.3 kg) 253 lb 3.2 oz (114.9 kg) 253 lb 6.4 oz (114.9 kg)    Physical Exam    GEN: Well nourished, well developed, in no acute distress.  HEENT: Grossly normal.  Neck: Supple, JVD elevated ~ 6 cm, no carotid bruits, or masses. Cardiac: Irregularly irregular, no murmurs, rubs, or gallops. No clubbing, cyanosis, edema.  Radials/DP/PT 2+ and equal bilaterally.  Respiratory:  Respirations regular and unlabored, clear to auscultation bilaterally. GI: Obese, soft, nontender, nondistended, BS + x 4. MS: no deformity or atrophy. Skin: warm and dry, no rash. Neuro:  Strength and sensation are intact. Psych: AAOx3.  Normal affect.  Labs    CBC  Recent Labs  08/01/16 0410 08/02/16 0419  WBC 5.2 6.8  HGB 15.6 15.1  HCT 45.6 45.9  MCV 80.2 82.2  PLT 168 169   Basic Metabolic Panel  Recent Labs  07/31/16 0146 08/01/16 0410  NA 134* 134*  K  3.8 3.7  CL 99* 97*  CO2 30 29  GLUCOSE 109* 96  BUN 17 18  CREATININE 0.75 0.77  CALCIUM 8.8* 9.1  MG  --  1.8   Liver Function Tests No results for input(s): AST, ALT, ALKPHOS, BILITOT, PROT, ALBUMIN in the last 72 hours. No results for input(s): LIPASE, AMYLASE in the last 72 hours. Cardiac Enzymes No results for input(s): CKTOTAL, CKMB, CKMBINDEX, TROPONINI in the last 72 hours. BNP Invalid input(s): POCBNP D-Dimer No results for input(s): DDIMER in the last 72 hours. Hemoglobin A1C No results for input(s): HGBA1C in the last 72 hours. Fasting Lipid Panel No results for input(s): CHOL, HDL, LDLCALC, TRIG, CHOLHDL, LDLDIRECT in the last 72 hours. Thyroid Function Tests  Recent Labs  07/31/16 0146  TSH <0.010*    Telemetry    Afib, 90's bpm - Personally Reviewed  ECG    n/a - Personally Reviewed  Radiology    No results found.  Cardiac Studies   TTE  07/29/2016: Study Conclusions  - Left ventricle: The cavity size was mildly dilated. There was   moderate concentric hypertrophy. Systolic function was severely   reduced. The estimated ejection fraction was in the range of 25%   to 30%. Diffuse hypokinesis. - Aorta: Aortic root dimension: 50 mm (ED). - Aortic root: The aortic root was moderately dilated. - Mitral valve: There was moderate regurgitation. - Left atrium: The atrium was severely dilated. - Right atrium: The atrium was severely dilated. - Pulmonary arteries: Systolic pressure was moderately increased.   PA peak pressure: 50 mm Hg (S).  Patient Profile     66 y.o. male with history of hyperthyroidism previously on methimazole though not taking for approximately one month, persistent atrial fibrillation not on full dose anticoagulation as an outpatient, hypertension, ongoing tobacco abuse, alcohol abuse, and obesity who presented to [facility] ED with increased shortness of breath for approximately 6-8 weeks and was noted to be in persistent Afib with intermittent RVR with echo showing newly found cardiomyopathy with an EF of 25-30%.  Assessment & Plan    1. Acute systolic CHF: -He is for cardiac catheterization this morning given his newly found reduced EF -Has diuresed well for the admission as above -Breathing improved -Notes some chest pain that is positional -Continue IV Lasix (pending cath), Toprol, losartan -Consider changing to [facility] as an outpatient if finances allow as well as BP -CHF education -Risks and benefits of cardiac catheterization have been discussed with the patient including risks of bleeding, bruising, infection, kidney damage, stroke, heart attack, and death. The patient understands these risks and is willing to proceed with the procedure. All questions have been answered and concerns listened to  2. Persistent Afib: -Remains in Afib with heart rate in the 90's bpm -On heparin gtt given the above need  for cardiac cath -Toprol as above -CHADS2VASc at least 3 (CHF, HTN, age x 1) -Will need long-term, full dose anticoagulation s/p cardiac cath -As long as his heart rate remains well controlled plan for full anticoagulation x 3 weeks followed by DCCV if needed -Likely in the setting of his hyperthyroidism   3. Elevated troponin: -Cath as above -Chest pain is positional   4. Hyperthyroidism: -Not compliant  -Per IM  5. Tobacco abuse: -Cessation advised  6. Hypertensive heart disease: -BP improved -Continue current medications as above  7. Pulmonary nodule: -Will need follow up imaging with PCP  Signed, Eula Listen, PA-C [facility] Pager: [phone] 08/02/2016, 7:43 AM

## 2016-08-03 DIAGNOSIS — E0590 Thyrotoxicosis, unspecified without thyrotoxic crisis or storm: Secondary | ICD-10-CM

## 2016-08-03 LAB — BASIC METABOLIC PANEL
ANION GAP: 8 (ref 5–15)
BUN: 15 mg/dL (ref 6–20)
CALCIUM: 9.3 mg/dL (ref 8.9–10.3)
CO2: 27 mmol/L (ref 22–32)
Chloride: 97 mmol/L — ABNORMAL LOW (ref 101–111)
Creatinine, Ser: 0.8 mg/dL (ref 0.61–1.24)
Glucose, Bld: 114 mg/dL — ABNORMAL HIGH (ref 65–99)
POTASSIUM: 4.1 mmol/L (ref 3.5–5.1)
SODIUM: 132 mmol/L — AB (ref 135–145)

## 2016-08-03 MED ORDER — SPIRONOLACTONE 25 MG PO TABS
12.5000 mg | ORAL_TABLET | Freq: Every day | ORAL | Status: DC
  Administered 2016-08-03 – 2016-08-04 (×2): 12.5 mg via ORAL
  Filled 2016-08-03 (×2): qty 1

## 2016-08-03 MED ORDER — DIGOXIN 125 MCG PO TABS
0.1250 mg | ORAL_TABLET | Freq: Every day | ORAL | Status: DC
  Administered 2016-08-04: 0.125 mg via ORAL
  Filled 2016-08-03: qty 1

## 2016-08-03 MED ORDER — SODIUM CHLORIDE 0.9% FLUSH
3.0000 mL | Freq: Two times a day (BID) | INTRAVENOUS | Status: DC
  Administered 2016-08-03 – 2016-08-04 (×2): 3 mL via INTRAVENOUS

## 2016-08-03 MED ORDER — DIGOXIN 0.25 MG/ML IJ SOLN
0.2500 mg | Freq: Once | INTRAMUSCULAR | Status: AC
  Administered 2016-08-03: 0.25 mg via INTRAVENOUS
  Filled 2016-08-03: qty 1

## 2016-08-03 NOTE — Care Management (Signed)
Left phone message with patient's daughter to discuss there is no evidence that a [place] Medicaid application has been initiated and patient will not have insurance for any services until swithc insurance from the [facility] managed care plan.

## 2016-08-03 NOTE — Care Management (Signed)
Patient did not discharge last evening due to "run of V tach".      Patient is again asking for transportation assistance.  Discussed that his daughter Toniann Fail has stated that there are no transportation issues but provided written resource contact information. Contacted [employer] to determine whether an [place] medicaid application has been initiated but agency says can not provide any information regarding whether application has been started.  Marlene Lard financial counselor investigate.  Contacted patient's insurance carrier regarding in network home health agencies.  There are none outside of [place]. Informed that patient's plan is only in effect in [place] and if moved out of state should have "called the state" to notify of change in state of residence.  Representative Stated it is doubtful that hospital stay at [facility] would be covered.  Discussed that CM thought auth had been obtained for his hospitalization and informed '[employer] [employer] is not a guarantee of payment.:  Reference number for this call-[id number] [phone]. Left VM for Pre service center team and sent email regarding this concern.

## 2016-08-03 NOTE — Telephone Encounter (Signed)
Currently admitted.

## 2016-08-03 NOTE — Progress Notes (Signed)
Patient Name: Eugene Henderson Date of Encounter: 08/03/2016  Primary Cardiologist: [facility] Problem List     Principal Problem:   Acute respiratory distress Active Problems:   Persistent atrial fibrillation (HCC)   Elevated troponin   Hyperthyroidism   Noncompliance with medications   Essential hypertension   Shortness of breath   Acute on chronic combined systolic and diastolic CHF (congestive heart failure) (HCC)     Subjective   No chest pain. SOB continues to improve. Underwent R/LHC on 2/26 that showed no occlusive CAD with mildly elevated right heart pressures (see below). Remains in Afib with tachycardic heart rates into the 110's-120's bpm. Net - 7.4 L for the admission. Remains down 37 pounds for the admission.    Inpatient Medications    Scheduled Meds: . apixaban  5 mg Oral BID  . digoxin  0.25 mg Intravenous Once  . [START ON 08/04/2016] digoxin  0.125 mg Oral Daily  . furosemide  40 mg Oral BID  . losartan  25 mg Oral Daily  . magnesium oxide  400 mg Oral Daily  . methimazole  20 mg Oral TID  . metoprolol succinate  200 mg Oral Daily  . spironolactone  12.5 mg Oral Daily   Continuous Infusions:  PRN Meds: sodium chloride, acetaminophen **OR** acetaminophen, LORazepam, ondansetron **OR** ondansetron (ZOFRAN) IV, traMADol   Vital Signs    Vitals:   08/02/16 1926 08/03/16 0413 08/03/16 0800 08/03/16 0802  BP: 129/90 106/90 130/83   Pulse: 86 72 (!) 42 88  Resp: 18 18 20    Temp: 97.7 F (36.5 C) 97.6 F (36.4 C) 98.6 F (37 C)   TempSrc: Oral Oral Oral   SpO2: 96% 97% 98%   Weight:  253 lb 6.4 oz (114.9 kg)    Height:        Intake/Output Summary (Last 24 hours) at 08/03/16 0924 Last data filed at 08/03/16 0413  Gross per 24 hour  Intake                0 ml  Output             1700 ml  Net            -1700 ml   Filed Weights   08/01/16 1514 08/02/16 0451 08/03/16 0413  Weight: 253 lb 3.2 oz (114.9 kg) 253 lb 6.4 oz (114.9 kg) 253  lb 6.4 oz (114.9 kg)    Physical Exam    GEN: Well nourished, well developed, in no acute distress.  HEENT: Grossly normal.  Neck: Supple, mildly elevated JVD, no carotid bruits, or masses. Cardiac: Tachycardic, irregularly irregular, no murmurs, rubs, or gallops. No clubbing, cyanosis, edema.  Radials/DP/PT 2+ and equal bilaterally.  Respiratory:  Respirations regular and unlabored, clear to auscultation bilaterally. GI: Soft, nontender, nondistended, BS + x 4. MS: no deformity or atrophy. Skin: warm and dry, no rash. Neuro:  Strength and sensation are intact. Psych: AAOx3.  Normal affect.  Labs    CBC  Recent Labs  08/01/16 0410 08/02/16 0419  WBC 5.2 6.8  HGB 15.6 15.1  HCT 45.6 45.9  MCV 80.2 82.2  PLT 168 169   Basic Metabolic Panel  Recent Labs  08/01/16 0410 08/03/16 0824  NA 134* 132*  K 3.7 4.1  CL 97* 97*  CO2 29 27  GLUCOSE 96 114*  BUN 18 15  CREATININE 0.77 0.80  CALCIUM 9.1 9.3  MG 1.8  --    Liver  Function Tests No results for input(s): AST, ALT, ALKPHOS, BILITOT, PROT, ALBUMIN in the last 72 hours. No results for input(s): LIPASE, AMYLASE in the last 72 hours. Cardiac Enzymes No results for input(s): CKTOTAL, CKMB, CKMBINDEX, TROPONINI in the last 72 hours. BNP Invalid input(s): POCBNP D-Dimer No results for input(s): DDIMER in the last 72 hours. Hemoglobin A1C No results for input(s): HGBA1C in the last 72 hours. Fasting Lipid Panel No results for input(s): CHOL, HDL, LDLCALC, TRIG, CHOLHDL, LDLDIRECT in the last 72 hours. Thyroid Function Tests No results for input(s): TSH, T4TOTAL, T3FREE, THYROIDAB in the last 72 hours.  Invalid input(s): FREET3  Telemetry    Afib, 110's to 120's bpm - Personally Reviewed  ECG    n/a - Personally Reviewed  Radiology    No results found.  Cardiac Studies   [facility] 08/02/16: Conclusion   1. No significant coronary artery disease. 2. Severely reduced LV systolic function by echo. Left  ventricular angiography was not performed.  3. Right heart catheterization showed minimally elevated filling pressures and pulmonary hypertension. Moderately reduced cardiac output. RA pressure: 12 mmHg, RV pressure: 40 over 6 mmHg, PA pressure 37/12 with a mean of 12 mmHg. Pulmonary capillary wedge pressure was 12. Cardiac output was 4.16 with a cardiac index of 1.77.  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 His volume status appears to be close to normal. Thus, I switched furosemide to 40 mg by mouth twice daily. Ventricular rate is still not optimally controlled and thus I increased Toprol to 200 mg once daily. Start long-term anticoagulation with Eliquis 5 mg twice daily starting tomorrow.  The patient was noted to have apnea episodes during cardiac catheterization. Recommend outpatient evaluation for sleep apnea. Avoid catheterization via the right radial artery in the future due to tortuous innominate artery and significantly dilated aortic root.    TTE 07/29/16: Study Conclusions  - Left ventricle: The cavity size was mildly dilated. There was   moderate concentric hypertrophy. Systolic function was severely   reduced. The estimated ejection fraction was in the range of 25%   to 30%. Diffuse hypokinesis. - Aorta: Aortic root dimension: 50 mm (ED). - Aortic root: The aortic root was moderately dilated. - Mitral valve: There was moderate regurgitation. - Left atrium: The atrium was severely dilated. - Right atrium: The atrium was severely dilated. - Pulmonary arteries: Systolic pressure was moderately increased.   PA peak pressure: 50 mm Hg (S).   Patient Profile     67 y.o. male with history of hyperthyroidism previously on methimazole though not taking for approximately one month, persistent atrial fibrillation not on full dose anticoagulation as an outpatient, hypertension, ongoing tobacco abuse, alcohol abuse, and obesity who  presented to [facility] ED with increased shortness of breath for approximately 6-8 weeks and was noted to be in persistent Afib with intermittent RVR with echo showing newly found cardiomyopathy with an EF of 25-30%, felt to be tachy-mediated given LHC on 2/26 that showed no occlusive CAD.   Assessment & Plan    1. Acute systolic CHF: -Continues to improve -Status post R/LHC on 2/26 that shoeed no significant CAD with mildly elevated filling pressures and pulmonary hypertension with moderately reduced cardiac output -It is felt his cardiomyopathy is tachy-mediated/NICM -Has diuresed well for the admission, continue PO Lasix 40 mg bid with KCl repletion -Add digoxin as below -Add spironolactone 12.5 mg daily  -Continue Toprol XL 200 mg daily -Consider changing to [facility] as an outpatient if finances  allow as well as BP -CHF education  2. Persistent Afib: -Remains in Afib with mildly tachycardiac heart rates in the 110's to 120's bpm -Toprol XL was increased to 200 mg daily on 2/26 -Add digoxin 0.25 mg IV x 1 today followed by 0.125 mg daily starting on 2/28, check digoxin level on 2/28 -Given his cardiomyopathy, would like to see better heart rate control prior to discharge -Started on Eliquis 5 mg bid today for full dose anticoagulation -CHADS2VASc at least 3 (CHF, HTN, age x 1) -As long as his heart rate remains well controlled plan for full anticoagulation x 3 weeks followed by DCCV if needed, would like to avoid this at this time to allow for better control of his hyperthyroidism   3. Elevated troponin: -Cath as above -No further chest pain  4. Hyperthyroidism: -Not compliant  -Per IM  5. Tobacco abuse: -Cessation advised  6. Hypertensive heart disease: -BP improved -Continue current medications as above  7. Pulmonary nodule: -Will need follow up imaging with PCP   Signed, Eula Listen, PA-C [facility] Pager: [phone] 08/03/2016, 9:24 AM

## 2016-08-03 NOTE — Progress Notes (Signed)
[employer] - [place] at [facility]   PATIENT NAME: Eugene Henderson    MR#:  [id number]  DATE OF BIRTH:  1950/07/26  SUBJECTIVE:  CHIEF COMPLAINT:   Chief Complaint  Patient presents with  . Shortness of Breath    Came with SOB, having Fluid overload, CHF- with a fib and RVR.   HR improved  Cath with No significant CAD.  Patient felt weak and anxious prior to discharge plan  REVIEW OF SYSTEMS:  CONSTITUTIONAL: No fever, fatigue or weakness.  EYES: No blurred or double vision.  EARS, NOSE, AND THROAT: No tinnitus or ear pain.  RESPIRATORY: No cough, positive for shortness of breath, wheezing or hemoptysis.  CARDIOVASCULAR: No chest pain, orthopnea, edema.  GASTROINTESTINAL: No nausea, vomiting, diarrhea or abdominal pain.  GENITOURINARY: No dysuria, hematuria.  ENDOCRINE: No polyuria, nocturia,  HEMATOLOGY: No anemia, easy bruising or bleeding SKIN: No rash or lesion. MUSCULOSKELETAL: No joint pain or arthritis.   NEUROLOGIC: No tingling, numbness, weakness.  PSYCHIATRY: No anxiety or depression.   ROS  DRUG ALLERGIES:  No Known Allergies  VITALS:  Blood pressure 106/90, pulse 72, temperature 97.6 F (36.4 C), temperature source Oral, resp. rate 18, height 6' (1.829 m), weight 114.9 kg (253 lb 6.4 oz), SpO2 97 %.  PHYSICAL EXAMINATION:  GENERAL:  66 y.o.-year-old patient lying in the bed with no acute distress.  EYES: Pupils equal, round, reactive to light and accommodation. No scleral icterus. Extraocular muscles intact.  HEENT: Head atraumatic, normocephalic. Oropharynx and nasopharynx clear.  NECK:  Supple, no jugular venous distention. No thyroid enlargement, no tenderness.  LUNGS: Normal breath sounds bilaterally, no wheezing, some crepitation. No use of accessory muscles of respiration.  CARDIOVASCULAR: S1, S2 fast. No murmurs, rubs, or gallops.  ABDOMEN: Soft, nontender, nondistended. Bowel sounds present. No organomegaly or mass.   EXTREMITIES: No pedal edema, cyanosis, or clubbing.  NEUROLOGIC: Cranial nerves II through XII are intact. Muscle strength 5/5 in all extremities. Sensation intact. Gait not checked.  PSYCHIATRIC: The patient is alert and oriented x 3.  SKIN: No obvious rash, lesion, or ulcer.   Physical Exam LABORATORY PANEL:   CBC  Recent Labs Lab 08/02/16 0419  WBC 6.8  HGB 15.1  HCT 45.9  PLT 169   ------------------------------------------------------------------------------------------------------------------  Chemistries   Recent Labs Lab 08/01/16 0410  NA 134*  K 3.7  CL 97*  CO2 29  GLUCOSE 96  BUN 18  CREATININE 0.77  CALCIUM 9.1  MG 1.8   ------------------------------------------------------------------------------------------------------------------  Cardiac Enzymes  Recent Labs Lab 07/28/16 1652 07/28/16 2155  TROPONINI 0.04* 0.04*   ------------------------------------------------------------------------------------------------------------------  RADIOLOGY:  No results found.  ASSESSMENT AND PLAN:   Principal Problem:   Acute respiratory distress Active Problems:   Persistent atrial fibrillation (HCC)   Elevated troponin   Hyperthyroidism   Noncompliance with medications   Essential hypertension   Shortness of breath   Acute on chronic combined systolic and diastolic CHF (congestive heart failure) (HCC)  66 year old male with past medical history of hypertension, Osteoarthritis, hyperthyroidism who presented to the hospital due to shortness of breath.  1. Acute systolic CHF - Likely ischemic cardiomyopathy   25-30% EF   IV lasix --> change to PO   Appreciated cardiology input   On metoprolol.   ACE inhibitor on discharge.  2. Elevated troponin-likely supply demand ischemia secondary to atrial fibrillation/flutter.  - Remained stable on follow up troponin.  NO CAD on cath  3. Hyperthyroidism-patient's TSH was significantly low.  non-compliant with his meds.  resumed Methimazole.  - will need outpatient Endocrine follow up.  Repeat TSH still low  4. New Onset A. Fib/flutter - rate controlled.   Metoprolol.  - On telemetry, Heparin gtt, Cards consult, Echo.    Oral anti-coagulation with eliquis from tomorrow  5. HTN - cont. Lisinopril and  Metoprolol.   All the records are reviewed and case discussed with Care Management/Social Worker. Management plans discussed with the patient, family and they are in agreement.  CODE STATUS: Full.  TOTAL TIME TAKING CARE OF THIS PATIENT: 35 minutes.   Monitor overnight and d/c in AM  Milagros Loll R M.D on 08/03/2016   Between 7am to 6pm - Pager - [phone]  After 6pm go to [email or web address] - Social research officer, government  [facility] [place] [facility]  Office  [phone]  CC: Primary care physician; No PCP Per Patient  Note: This dictation was prepared with Dragon dictation along with smaller phrase technology. Any transcriptional errors that result from this process are unintentional.

## 2016-08-03 NOTE — Progress Notes (Signed)
[employer] - [place] at [facility]   PATIENT NAME: Eugene Henderson    MR#:  [id number]  DATE OF BIRTH:  1951-02-14  SUBJECTIVE:  CHIEF COMPLAINT:   Chief Complaint  Patient presents with  . Shortness of Breath    Came with SOB, having Fluid overload, CHF- with a fib and RVR. Cath with No significant CAD.  Atrial fibrillation with rapid ventricular rate in the 120s overnight. Causes fatigue and shortness of breath during these episodes.  REVIEW OF SYSTEMS:  CONSTITUTIONAL: No fever, fatigue or weakness.  EYES: No blurred or double vision.  EARS, NOSE, AND THROAT: No tinnitus or ear pain.  RESPIRATORY: No cough, positive for shortness of breath, wheezing or hemoptysis.  CARDIOVASCULAR: No chest pain, orthopnea, edema.  GASTROINTESTINAL: No nausea, vomiting, diarrhea or abdominal pain.  GENITOURINARY: No dysuria, hematuria.  ENDOCRINE: No polyuria, nocturia,  HEMATOLOGY: No anemia, easy bruising or bleeding SKIN: No rash or lesion. MUSCULOSKELETAL: No joint pain or arthritis.   NEUROLOGIC: No tingling, numbness, weakness.  PSYCHIATRY: No anxiety or depression.   ROS  DRUG ALLERGIES:  No Known Allergies  VITALS:  Blood pressure 130/83, pulse 88, temperature 98.6 F (37 C), temperature source Oral, resp. rate 20, height 6' (1.829 m), weight 114.9 kg (253 lb 6.4 oz), SpO2 98 %.  PHYSICAL EXAMINATION:  GENERAL:  66 y.o.-year-old patient lying in the bed with no acute distress.  EYES: Pupils equal, round, reactive to light and accommodation. No scleral icterus. Extraocular muscles intact.  HEENT: Head atraumatic, normocephalic. Oropharynx and nasopharynx clear.  NECK:  Supple, no jugular venous distention. No thyroid enlargement, no tenderness.  LUNGS: Normal breath sounds bilaterally, no wheezing, some crepitation. No use of accessory muscles of respiration.  CARDIOVASCULAR: Irregularly irregular and tachycardic  ABDOMEN: Soft, nontender, nondistended. Bowel  sounds present. No organomegaly or mass.  EXTREMITIES: No pedal edema, cyanosis, or clubbing.  NEUROLOGIC: Cranial nerves II through XII are intact. Muscle strength 5/5 in all extremities. Sensation intact. Gait not checked.  PSYCHIATRIC: The patient is alert and oriented x 3.  SKIN: No obvious rash, lesion, or ulcer.   Physical Exam LABORATORY PANEL:   CBC  Recent Labs Lab 08/02/16 0419  WBC 6.8  HGB 15.1  HCT 45.9  PLT 169   ------------------------------------------------------------------------------------------------------------------  Chemistries   Recent Labs Lab 08/01/16 0410 08/03/16 0824  NA 134* 132*  K 3.7 4.1  CL 97* 97*  CO2 29 27  GLUCOSE 96 114*  BUN 18 15  CREATININE 0.77 0.80  CALCIUM 9.1 9.3  MG 1.8  --    ------------------------------------------------------------------------------------------------------------------  Cardiac Enzymes  Recent Labs Lab 07/28/16 1652 07/28/16 2155  TROPONINI 0.04* 0.04*   ------------------------------------------------------------------------------------------------------------------  RADIOLOGY:  No results found.  ASSESSMENT AND PLAN:   Principal Problem:   Acute respiratory distress Active Problems:   Persistent atrial fibrillation (HCC)   Elevated troponin   Hyperthyroidism   Noncompliance with medications   Essential hypertension   Shortness of breath   Acute on chronic combined systolic and diastolic CHF (congestive heart failure) (HCC)  66 year old male with past medical history of hypertension, Osteoarthritis, hyperthyroidism who presented to the hospital due to shortness of breath.  1. Acute systolic CHF - Likely ischemic cardiomyopathy   25-30% EF   IV lasix --> change to PO   Appreciated cardiology input   On metoprolol.   Add Aldactone. Interest to as outpatient  2. Elevated troponin-likely supply demand ischemia secondary to atrial fibrillation/flutter.  - Remained stable on  follow up troponin. NO CAD on cath  3. Hyperthyroidism-patient's TSH was significantly low.   non-compliant with his meds.  resumed Methimazole.  - will need outpatient Endocrine follow up.  Repeat TSH still low  4. New Onset A. Fib/flutter - On Toprol Started Eliquis Discussed with cardiology. Load IV digoxin. Start oral digoxin from tomorrow.  5. HTN - cont. Lisinopril and  Metoprolol.   All the records are reviewed and case discussed with Care Management/Social Worker. Management plans discussed with the patient, family and they are in agreement.  CODE STATUS: Full.  TOTAL TIME TAKING CARE OF THIS PATIENT: 35 minutes.   Milagros Loll R M.D on 08/03/2016   Between 7am to 6pm - Pager - [phone]  After 6pm go to [email or web address] - Social research officer, government  [facility] [place] [facility]  Office  [phone]  CC: Primary care physician; No PCP Per Patient  Note: This dictation was prepared with Dragon dictation along with smaller phrase technology. Any transcriptional errors that result from this process are unintentional.

## 2016-08-03 NOTE — Plan of Care (Signed)
Problem: Cardiovascular: Goal: Ability to achieve and maintain adequate cardiovascular perfusion will improve Outcome: Progressing Right radial/femoral puncture sites without hematoma or active bleed.  Pulses palpable.

## 2016-08-04 LAB — DIGOXIN LEVEL

## 2016-08-04 MED ORDER — DIGOXIN 125 MCG PO TABS: 0.1250 mg | ORAL_TABLET | Freq: Every day | ORAL | 0 refills | Status: DC

## 2016-08-04 MED ORDER — LOSARTAN POTASSIUM 25 MG PO TABS: 12.5000 mg | ORAL_TABLET | Freq: Every day | ORAL | 0 refills | Status: DC

## 2016-08-04 MED ORDER — SPIRONOLACTONE 25 MG PO TABS: 12.5000 mg | ORAL_TABLET | Freq: Every day | ORAL | 0 refills | Status: DC

## 2016-08-04 MED ORDER — LOSARTAN POTASSIUM 25 MG PO TABS
12.5000 mg | ORAL_TABLET | Freq: Every day | ORAL | Status: DC
  Administered 2016-08-04: 12.5 mg via ORAL
  Filled 2016-08-04: qty 1

## 2016-08-04 NOTE — Telephone Encounter (Signed)
Currently admitted.

## 2016-08-04 NOTE — Care Management Note (Addendum)
Case Management Note  Patient Details  Name: Eugene Henderson MRN: [id number] Date of Birth: [birth year]          Action/Plan: [facility].  Spoke with patient and daughter about not having insurance and the need to apply for the medicare/medicare asap. Strongly encouraged to keep md appointments and that meds do not no refills.  Verbalized understanding.  Provided application for [facility]   Expected Discharge Date:  08/04/16               Expected Discharge Plan:     In-House Referral:     Discharge planning Services  HF Clinic, Medication Assistance, Indigent Health Clinic  Post Acute Care Choice:    Choice offered to:     DME Arranged:    DME Agency:     HH Arranged:    HH Agency:     Status of Service:  Completed, signed off  If discussed at [employer], dates discussed:    Additional Comments:  Eber Hong, RN 08/04/2016, 4:47 PM

## 2016-08-04 NOTE — Progress Notes (Signed)
Patient Name: Eugene Henderson Date of Encounter: 08/04/2016  Primary Cardiologist: [facility] Problem List     Principal Problem:   Acute respiratory distress Active Problems:   Persistent atrial fibrillation (HCC)   Elevated troponin   Hyperthyroidism   Noncompliance with medications   Essential hypertension   Shortness of breath   Acute on chronic combined systolic and diastolic CHF (congestive heart failure) (HCC)     Subjective   No acute overnight events. Breathing back to baseline. No chest pain. Heart rate better controlled after IV digoxin 0.25 mg on 2/27. Remains in Afib. Ambulated without issues on 2/27.   Inpatient Medications    Scheduled Meds: . apixaban  5 mg Oral BID  . digoxin  0.125 mg Oral Daily  . furosemide  40 mg Oral BID  . losartan  25 mg Oral Daily  . magnesium oxide  400 mg Oral Daily  . methimazole  20 mg Oral TID  . metoprolol succinate  200 mg Oral Daily  . sodium chloride flush  3 mL Intravenous Q12H  . spironolactone  12.5 mg Oral Daily   Continuous Infusions:  PRN Meds: sodium chloride, acetaminophen **OR** acetaminophen, LORazepam, ondansetron **OR** ondansetron (ZOFRAN) IV, traMADol   Vital Signs    Vitals:   08/03/16 0802 08/03/16 1250 08/03/16 2016 08/04/16 0442  BP:  111/77 (!) 122/91 98/65  Pulse: 88 83 79 85  Resp:  12 18 18   Temp:  97.2 F (36.2 C) 98 F (36.7 C) 97.8 F (36.6 C)  TempSrc:   Oral Oral  SpO2:  98% 100% 97%  Weight:    248 lb 4.8 oz (112.6 kg)  Height:        Intake/Output Summary (Last 24 hours) at 08/04/16 0736 Last data filed at 08/04/16 0443  Gross per 24 hour  Intake              480 ml  Output             1450 ml  Net             -970 ml   Filed Weights   08/02/16 0451 08/03/16 0413 08/04/16 0442  Weight: 253 lb 6.4 oz (114.9 kg) 253 lb 6.4 oz (114.9 kg) 248 lb 4.8 oz (112.6 kg)    Physical Exam    GEN: Well nourished, well developed, in no acute distress.  HEENT: Grossly  normal.  Neck: Supple, no JVD, carotid bruits, or masses. Cardiac: Irregularly irregular, no murmurs, rubs, or gallops. No clubbing, cyanosis, edema.  Radials/DP/PT 2+ and equal bilaterally.  Respiratory:  Respirations regular and unlabored, clear to auscultation bilaterally. GI: Soft, nontender, nondistended, BS + x 4. MS: no deformity or atrophy. Skin: warm and dry, no rash. Neuro:  Strength and sensation are intact. Psych: AAOx3.  Normal affect.  Labs    CBC  Recent Labs  08/02/16 0419  WBC 6.8  HGB 15.1  HCT 45.9  MCV 82.2  PLT 169   Basic Metabolic Panel  Recent Labs  08/03/16 0824  NA 132*  K 4.1  CL 97*  CO2 27  GLUCOSE 114*  BUN 15  CREATININE 0.80  CALCIUM 9.3   Liver Function Tests No results for input(s): AST, ALT, ALKPHOS, BILITOT, PROT, ALBUMIN in the last 72 hours. No results for input(s): LIPASE, AMYLASE in the last 72 hours. Cardiac Enzymes No results for input(s): CKTOTAL, CKMB, CKMBINDEX, TROPONINI in the last 72 hours. BNP Invalid input(s): POCBNP D-Dimer  No results for input(s): DDIMER in the last 72 hours. Hemoglobin A1C No results for input(s): HGBA1C in the last 72 hours. Fasting Lipid Panel No results for input(s): CHOL, HDL, LDLCALC, TRIG, CHOLHDL, LDLDIRECT in the last 72 hours. Thyroid Function Tests No results for input(s): TSH, T4TOTAL, T3FREE, THYROIDAB in the last 72 hours.  Invalid input(s): FREET3  Telemetry    Afib, 80's bpm - Personally Reviewed  ECG    n/a - Personally Reviewed  Radiology    No results found.  Cardiac Studies   [facility] 08/02/16: Conclusion   1. No significant coronary artery disease. 2. Severely reduced LV systolic function by echo. Left ventricular angiography was not performed.  3. Right heart catheterization showed minimally elevated filling pressures and pulmonary hypertension. Moderately reduced cardiac output. RA pressure: 12 mmHg, RV pressure: 40 over 6 mmHg, PA pressure 37/12 with a  mean of 12 mmHg. Pulmonary capillary wedge pressure was 12. Cardiac output was 4.16 with a cardiac index of 1.77.  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 His volume status appears to be close to normal. Thus, I switched furosemide to 40 mg by mouth twice daily. Ventricular rate is still not optimally controlled and thus I increased Toprol to 200 mg once daily. Start long-term anticoagulation with Eliquis 5 mg twice daily starting tomorrow.  The patient was noted to have apnea episodes during cardiac catheterization. Recommend outpatient evaluation for sleep apnea. Avoid catheterization via the right radial artery in the future due to tortuous innominate artery and significantly dilated aortic root.    TTE 07/29/16: Study Conclusions  - Left ventricle: The cavity size was mildly dilated. There was moderate concentric hypertrophy. Systolic function was severely reduced. The estimated ejection fraction was in the range of 25% to 30%. Diffuse hypokinesis. - Aorta: Aortic root dimension: 50 mm (ED). - Aortic root: The aortic root was moderately dilated. - Mitral valve: There was moderate regurgitation. - Left atrium: The atrium was severely dilated. - Right atrium: The atrium was severely dilated. - Pulmonary arteries: Systolic pressure was moderately increased. PA peak pressure: 50 mm Hg (S).   Patient Profile     66 y.o. male with history of hyperthyroidism previously on methimazole though not taking for approximately one month, persistent atrial fibrillation not on full dose anticoagulation as an outpatient, hypertension, ongoing tobacco abuse, alcohol abuse, and obesity who presented [facility] ED with increased shortness of breath for approximately 6-8 weeks and was noted to be in persistent Afib with intermittent RVR with echo showing newly found cardiomyopathy with an EF of 25-30%, felt to be tachy-mediated given LHC on 2/26 that  showed no occlusive CAD.  Assessment & Plan    1. Acute systolic CHF: -Continues to improve, notes he is back to his baseline -Status post R/LHC on 2/26 that shoeed no significant CAD with mildly elevated filling pressures and pulmonary hypertension with moderately reduced cardiac output -It is felt his cardiomyopathy is tachy-mediated/NICM -Has diuresed well for the admission, continue PO Lasix 40 mg bid with KCl repletion -Received IV digoxin 0.25 mg x 1 on 2/27 with improvement in heart rate -Continue PO digoxin 0.125 mg daily starting today -Plan for outpatient digoxin level on 3/2 -Continue spironolactone 12.5 mg daily  -Continue Toprol XL 200 mg daily -Decrease losartan to 12.5 given soft BP this morning -Consider changing to [facility] as an outpatient if finances allow as well as BP -CHF education  2. Persistent Afib: -Remains in Afib with heart rates improved  and controlled as above -Toprol XL was increased to 200 mg daily on 2/26 for added rate control -BP soft this morning, will decrease losartan as above as would like to maintain adequate rate control -Received digoxin 0.25 mg IV x 1 on 2/27 -Will continue with PO digoxin 0.125 mg daily starting on 2/28 -Digoxin level this morning of <0.2, plan for outpatient digoxin level on 3/2 -Ambulate today, if heart rate remains well controlled with ambulation (does not walk much at baseline 2/2 chronic back pain), he could possibly be discharged today -Continue Eliquis 5 mg bid -CHADS2VASc at least 3 (CHF, HTN, age x 1) -As long as his heart rate remains well controlled plan for full anticoagulation x 3 weeks followed by DCCV if needed, would like to avoid this at this time to allow for better control of his hyperthyroidism   3. Elevated troponin: -Cath as above -No further chest pain  4. Hyperthyroidism: -Not compliant  -Per IM  5. Tobacco abuse: -Cessation advised  6. Hypertensive heart disease: -BP soft this  morning -Decrease losartan as above -Continue current medications as above  7. Pulmonary nodule: -Will need follow up imaging with PCP  Signed, Eula Listen, PA-C [facility] Pager: [phone] 08/04/2016, 7:36 AM   Attending Note Patient seen and examined, agree with detailed note above,  Patient presentation and plan discussed on rounds.   Reports that he feels well with morning," the best that he is felt in some time" Denies shortness of breath, no chest pain, no significant leg swelling, no abdominal bloating  On physical exam lungs clear to auscultation, JVP difficult to estimate, Heart sounds irregularly irregular, no murmurs appreciated, abdomen obese soft nontender, no significant lower extremity edema  Lab work reviewed showing stable basement panel normal renal function, normal CBC  Vitals reviewed, blood pressure running 122 systolic yesterday, low on today's visit 104 systolic   --- Acute on chronic systolic CHF Management limited by noncompliance, finances, no insurance Agree with medications as currently listed above Likely will be unable to afford entresto on his own, will benefit from medical management assistance Case manager has discussed with family Will recommend follow-up with CHF clinic, as well as CHF clinic He reports he will try to be compliant  --- Atrial fibrillation We'll need to remain on anticoagulation, eliquis. If unable to afford a NOAC, may need to change to warfarin As an outpatient we can provide paperwork for company assistance (eliquis)   Greater than 50% was spent in counseling and coordination of care with patient Total encounter time 25 minutes or more   Signed: Dossie Arbour  M.D., Ph.D. [facility]

## 2016-08-04 NOTE — Care Management (Signed)
have not received return call from daughter to discuss the lack of payor.  Spoke with [facility] and agency will assist patient with meds for 1-2 months - giving time to apply for medicaid / apply for medicare plan that will provide for coverage in [place]. Faxed scripts to [facility]

## 2016-08-04 NOTE — Progress Notes (Signed)
Patient discharged via wheelchair and private vehicle. IV removed and catheter intact. All discharge instructions given and patient verbalizes understanding. Tele removed and returned. No prescriptions given to patient No distress noted.   

## 2016-08-05 NOTE — Telephone Encounter (Signed)
No answer. Number says mailbox in full.

## 2016-08-06 NOTE — Telephone Encounter (Signed)
Left voicemail message to call back regarding discharge information.

## 2016-08-10 ENCOUNTER — Ambulatory Visit: Payer: Medicare (Managed Care) | Admitting: Family

## 2016-08-11 NOTE — Discharge Summary (Signed)
[employer] [facility] [place] [facility]   PATIENT NAME: Eugene Henderson    MR#:  [id number]  DATE OF BIRTH:  1951-01-19  DATE OF ADMISSION:  07/28/2016 ADMITTING PHYSICIAN: Houston Siren, MD  DATE OF DISCHARGE: 08/04/2016  6:46 PM  PRIMARY CARE PHYSICIAN: No PCP Per Patient   ADMISSION DIAGNOSIS:  Shortness of breath [R06.02] Elevated troponin I level [R74.8] Essential hypertension [I10]  DISCHARGE DIAGNOSIS:  Principal Problem:   Acute respiratory distress Active Problems:   Persistent atrial fibrillation (HCC)   Elevated troponin   Hyperthyroidism   Noncompliance with medications   Essential hypertension   Shortness of breath   Acute on chronic combined systolic and diastolic CHF (congestive heart failure) (HCC)   SECONDARY DIAGNOSIS:   Past Medical History:  Diagnosis Date  . Arthritis   . Asthma   . Hypertension   . Hyperthyroidism   . Noncompliance   . Persistent atrial fibrillation (HCC)      ADMITTING HISTORY  HISTORY OF PRESENT ILLNESS:  Eugene Henderson  is a 66 y.o. male with a known history of Hypertension, hyperthyroidism, osteoarthritis who presents to the hospital due to shortness of breath. Patient says that he has been short of breath now for the past few weeks intermittently. Shortness of breath is usually worse with exertion but sometimes it occurs at rest. He denies any associated chest pain, nausea, vomiting, palpitation dizziness or syncope. This morning he was having significant shortness of breath on minimal exertion therefore came to the ER for further evaluation. Patient says that he has not been able to afford her take any of his scheduled medications for over a month. Patient was noted to have a mildly elevated troponin, also noted to be in atrial flutter/fibrillation and hospitalist services were contacted further treatment and evaluation.    HOSPITAL COURSE:   66 year old male with past medical history of hypertension,  Osteoarthritis, hyperthyroidism who presented to the hospital due to shortness of breath.  1. Acute systolic CHF - Likely ischemic cardiomyopathy   25-30% EF   IV lasix --> change to PO   Appreciated cardiology input   On metoprolol, lisinopril   Added Aldactone. He has follow-up at CHF clinic. Counseled regarding fluid and salt restriction along with daily weight check.  2. Elevated troponin-likely supply demand ischemia secondary to atrial fibrillation/flutter.  - Remained stable on follow up troponin. No CAD on cath  3. Hyperthyroidism-patient's TSH was significantly low.   non-compliant with his meds.  resumed Methimazole.  Repeat TSH still low <0.001 Needs outpatient follow-up with endocrinology. Counseled to be compliant with medications  4. New Onset A. Fib/flutter  Patient was initially on Toprol. Had poor control at this heart rate on activity and loaded with IV digoxin. By day of discharge his heart rate is between 80-90 in atrial fibrillation and he will be discharged home on metoprolol and digoxin. L it was for anticoagulation.  5. HTN - cont. Lisinopril and  Metoprolol.   Patient stable for discharge home with home health RN.  CONSULTS OBTAINED:  Treatment Team:  Antonieta Iba, MD  DRUG ALLERGIES:  No Known Allergies  DISCHARGE MEDICATIONS:   Discharge Medication List as of 08/04/2016  1:43 PM    START taking these medications   Details  apixaban (ELIQUIS) 5 MG TABS tablet Take 1 tablet (5 mg total) by mouth 2 (two) times daily., Starting Tue 08/03/2016, Print    magnesium oxide (MAG-OX) 400 (241.3 Mg) MG tablet Take 1 tablet (400  mg total) by mouth daily., Starting Mon 08/02/2016, Print    metoprolol succinate (TOPROL-XL) 200 MG 24 hr tablet Take 1 tablet (200 mg total) by mouth daily. Take with or immediately following a meal., Starting Mon 08/02/2016, Print    spironolactone (ALDACTONE) 25 MG tablet Take 0.5 tablets (12.5 mg total) by mouth daily.,  Starting Wed 08/04/2016, Print      CONTINUE these medications which have CHANGED   Details  digoxin (LANOXIN) 0.125 MG tablet Take 1 tablet (0.125 mg total) by mouth daily., Starting Wed 08/04/2016, Print    furosemide (LASIX) 40 MG tablet Take 1 tablet (40 mg total) by mouth 2 (two) times daily., Starting Mon 08/02/2016, Print    losartan (COZAAR) 25 MG tablet Take 0.5 tablets (12.5 mg total) by mouth daily., Starting Wed 08/04/2016, Print    methimazole (TAPAZOLE) 10 MG tablet Take 2 tablets (20 mg total) by mouth 2 (two) times daily., Starting Mon 08/02/2016, Print      CONTINUE these medications which have NOT CHANGED   Details  traMADol (ULTRAM) 50 MG tablet Take 1 tablet (50 mg total) by mouth every 6 (six) hours as needed., Starting Thu 06/03/2016, Until Fri 06/03/2017, Print      STOP taking these medications     naproxen sodium (ANAPROX) 220 MG tablet      lisinopril (PRINIVIL,ZESTRIL) 20 MG tablet      naproxen sodium (ANAPROX) 550 MG tablet      propylthiouracil (PTU) 50 MG tablet         Today   VITAL SIGNS:  Blood pressure 104/76, pulse 94, temperature 98.1 F (36.7 C), temperature source Oral, resp. rate 18, height 6' (1.829 m), weight 112.6 kg (248 lb 4.8 oz), SpO2 99 %.  I/O:  No intake or output data in the 24 hours ending 08/11/16 1644  PHYSICAL EXAMINATION:  Physical Exam  GENERAL:  66 y.o.-year-old patient lying in the bed with no acute distress.  LUNGS: Normal breath sounds bilaterally, no wheezing, rales,rhonchi or crepitation. No use of accessory muscles of respiration.  CARDIOVASCULAR: S1, S2 normal. No murmurs, rubs, or gallops.  ABDOMEN: Soft, non-tender, non-distended. Bowel sounds present. No organomegaly or mass.  NEUROLOGIC: Moves all 4 extremities. PSYCHIATRIC: The patient is alert and oriented x 3.  SKIN: No obvious rash, lesion, or ulcer.   DATA REVIEW:   CBC No results for input(s): WBC, HGB, HCT, PLT in the last 168  hours.  Chemistries  No results for input(s): NA, K, CL, CO2, GLUCOSE, BUN, CREATININE, CALCIUM, MG, AST, ALT, ALKPHOS, BILITOT in the last 168 hours.  Invalid input(s): GFRCGP  Cardiac Enzymes No results for input(s): TROPONINI in the last 168 hours.  Microbiology Results  No results found for this or any previous visit.  RADIOLOGY:  No results found.  Follow up with PCP in 1 week.  Management plans discussed with the patient, family and they are in agreement.  CODE STATUS:  Code Status History    Date Active Date Inactive Code Status Order ID Comments User Context   07/28/2016  1:55 PM 08/04/2016  9:51 PM Full Code [id number]  Houston Siren, MD ED    Advance Directive Documentation   Flowsheet Row Most Recent Value  Type of Advance Directive  Living will  Pre-existing out of facility DNR order (yellow form or pink MOST form)  No data  "MOST" Form in Place?  No data      TOTAL TIME TAKING CARE OF THIS PATIENT ON DAY  OF DISCHARGE: more than 30 minutes.   Milagros Loll R M.D on 08/11/2016 at 4:44 PM  Between 7am to 6pm - Pager - [phone]  After 6pm go to [email or web address] - password [email or web address]  [facility]  Office  [phone]  CC: Primary care physician; No PCP Per Patient  Note: This dictation was prepared with Dragon dictation along with smaller phrase technology. Any transcriptional errors that result from this process are unintentional.

## 2016-08-16 ENCOUNTER — Encounter: Payer: Medicare (Managed Care) | Admitting: Cardiovascular Disease

## 2016-08-18 ENCOUNTER — Ambulatory Visit: Payer: Medicare (Managed Care) | Attending: Family | Admitting: Family

## 2016-08-18 ENCOUNTER — Encounter: Payer: Self-pay | Admitting: Family

## 2016-08-18 VITALS — BP 113/78 | HR 92 | Resp 18 | Ht 72.0 in | Wt 261.1 lb

## 2016-08-18 DIAGNOSIS — E0590 Thyrotoxicosis, unspecified without thyrotoxic crisis or storm: Secondary | ICD-10-CM | POA: Insufficient documentation

## 2016-08-18 DIAGNOSIS — I10 Essential (primary) hypertension: Secondary | ICD-10-CM

## 2016-08-18 DIAGNOSIS — I110 Hypertensive heart disease with heart failure: Secondary | ICD-10-CM | POA: Diagnosis not present

## 2016-08-18 DIAGNOSIS — Z9114 Patient's other noncompliance with medication regimen: Secondary | ICD-10-CM | POA: Insufficient documentation

## 2016-08-18 DIAGNOSIS — I481 Persistent atrial fibrillation: Secondary | ICD-10-CM | POA: Insufficient documentation

## 2016-08-18 DIAGNOSIS — M1990 Unspecified osteoarthritis, unspecified site: Secondary | ICD-10-CM | POA: Diagnosis not present

## 2016-08-18 DIAGNOSIS — I509 Heart failure, unspecified: Secondary | ICD-10-CM | POA: Diagnosis not present

## 2016-08-18 DIAGNOSIS — J45909 Unspecified asthma, uncomplicated: Secondary | ICD-10-CM | POA: Diagnosis not present

## 2016-08-18 DIAGNOSIS — Z720 Tobacco use: Secondary | ICD-10-CM

## 2016-08-18 DIAGNOSIS — I5022 Chronic systolic (congestive) heart failure: Secondary | ICD-10-CM

## 2016-08-18 DIAGNOSIS — R0683 Snoring: Secondary | ICD-10-CM | POA: Diagnosis not present

## 2016-08-18 DIAGNOSIS — F17210 Nicotine dependence, cigarettes, uncomplicated: Secondary | ICD-10-CM | POA: Diagnosis not present

## 2016-08-18 DIAGNOSIS — I4819 Other persistent atrial fibrillation: Secondary | ICD-10-CM

## 2016-08-18 MED ORDER — FUROSEMIDE 40 MG PO TABS: 40.0000 mg | ORAL_TABLET | Freq: Two times a day (BID) | ORAL | 5 refills | Status: DC

## 2016-08-18 MED ORDER — DIGOXIN 125 MCG PO TABS: 0.1250 mg | ORAL_TABLET | Freq: Every day | ORAL | 5 refills | Status: DC

## 2016-08-18 MED ORDER — LOSARTAN POTASSIUM 25 MG PO TABS: 12.5000 mg | ORAL_TABLET | Freq: Every day | ORAL | 5 refills | Status: DC

## 2016-08-18 MED ORDER — SPIRONOLACTONE 25 MG PO TABS: 12.5000 mg | ORAL_TABLET | Freq: Every day | ORAL | 0 refills | Status: DC

## 2016-08-18 MED ORDER — METOPROLOL SUCCINATE ER 200 MG PO TB24: 200.0000 mg | ORAL_TABLET | Freq: Every day | ORAL | 5 refills | Status: DC

## 2016-08-18 MED ORDER — APIXABAN 5 MG PO TABS: 5.0000 mg | ORAL_TABLET | Freq: Two times a day (BID) | ORAL | 5 refills | Status: DC

## 2016-08-18 MED ORDER — SPIRONOLACTONE 25 MG PO TABS: 12.5000 mg | ORAL_TABLET | Freq: Every day | ORAL | 5 refills | Status: DC

## 2016-08-18 NOTE — Progress Notes (Signed)
Patient ID: Eugene Henderson, male    DOB: 1950/06/08, 66 y.o.   MRN: [id number]  HPI  Eugene Henderson is a 66 y/o male with a history of atrial fibrillation, hyperthyroidism, HTN, asthma, arthritis, current tobacco use and chronic heart failure.   Last echo was done 07/29/16 and showed an EF of 25-30% along with moderate Eugene and moderately increased PA pressure of 50 mm Hg. 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   Admitted 07/28/16 due to acute heart failure. Initially treated with IV diuretics and transitioned to oral diuretics. Elevated troponin thought to be due to demand ischemia. Also had new onset a fib and medications were started/adjusted. Discharged home after one week. Was in the ED on 06/03/16 with chest pain. Treated and released.   He presents today for his initial visit with fatigue and shortness of breath with minimal exertion. Symptoms do improve quickly upon rest. Notices palpitations with exertion at times. Denies any swelling in his legs/abdomen. Has not been weighing himself as he doesn't have any scales. Has been taking 4 naproxyn daily to help with knee/back pain but without much relief.   Past Medical History:  Diagnosis Date  . Arthritis   . Asthma   . CHF (congestive heart failure) (HCC)   . Hypertension   . Hyperthyroidism   . Noncompliance   . Persistent atrial fibrillation [facility])    Past Surgical History:  Procedure Laterality Date  . JOINT REPLACEMENT    . RIGHT/LEFT HEART CATH AND CORONARY ANGIOGRAPHY N/A 08/02/2016   Procedure: Right/Left Heart Cath and Coronary Angiography;  Surgeon: Iran Ouch, MD;  Location: [facility] INVASIVE CV LAB;  Service: Cardiovascular;  Laterality: N/A;   Family History  Problem Relation Age of Onset  . Diabetes Mother   . Diabetes Father   . Diabetes Brother    Social History  Substance Use Topics  . Smoking status: Current Some Day Smoker   Packs/day: 0.25    Years: 25.00    Types: Cigarettes  . Smokeless tobacco: Never Used  . Alcohol use Yes     Comment: Socially - once a month   No Known Allergies  Prior to Admission medications   Medication Sig Start Date End Date Taking? Authorizing Provider  apixaban (ELIQUIS) 5 MG TABS tablet Take 1 tablet (5 mg total) by mouth 2 (two) times daily. 08/18/16  Yes Delma Freeze, FNP  digoxin (LANOXIN) 0.125 MG tablet Take 1 tablet (0.125 mg total) by mouth daily. 08/18/16  Yes Delma Freeze, FNP  furosemide (LASIX) 40 MG tablet Take 1 tablet (40 mg total) by mouth 2 (two) times daily. 08/18/16  Yes Delma Freeze, FNP  losartan (COZAAR) 25 MG tablet Take 0.5 tablets (12.5 mg total) by mouth daily. 08/18/16  Yes Delma Freeze, FNP  magnesium oxide (MAG-OX) 400 (241.3 Mg) MG tablet Take 1 tablet (400 mg total) by mouth daily. 08/02/16  Yes Srikar Sudini, MD  methimazole (TAPAZOLE) 10 MG tablet Take 2 tablets (20 mg total) by mouth 2 (two) times daily. 08/02/16  Yes Srikar Sudini, MD  metoprolol (TOPROL-XL) 200 MG 24 hr tablet Take 1 tablet (200 mg total) by mouth daily. Take with or immediately following a meal. 08/18/16  Yes Delma Freeze, FNP  naproxen sodium (ANAPROX) 220 MG tablet Take 220 mg by mouth 2 (two) times daily with a meal.   Yes Historical Provider, MD  spironolactone (ALDACTONE) 25 MG  tablet Take 0.5 tablets (12.5 mg total) by mouth daily. 08/18/16  Yes Delma Freeze, FNP  traMADol (ULTRAM) 50 MG tablet Take 1 tablet (50 mg total) by mouth every 6 (six) hours as needed. Patient not taking: Reported on 07/28/2016 06/03/16 06/03/17  Charlynne Pander, MD    Review of Systems  Constitutional: Positive for fatigue. Negative for appetite change.  HENT: Positive for postnasal drip. Negative for congestion and sore throat.   Eyes: Negative.   Respiratory: Positive for chest tightness and shortness of breath. Negative for cough.   Cardiovascular: Positive for palpitations (with  exertion). Negative for chest pain and leg swelling.  Gastrointestinal: Negative for abdominal distention and abdominal pain.  Endocrine: Negative.   Genitourinary: Negative.   Musculoskeletal: Positive for arthralgias (knee) and back pain (chronic).  Skin: Negative.   Allergic/Immunologic: Negative.   Neurological: Negative for dizziness and facial asymmetry.  Hematological: Negative for adenopathy. Does not bruise/bleed easily.  Psychiatric/Behavioral: Positive for sleep disturbance (interrrupted; sleeping on 2 pillows; ). Negative for dysphoric mood and suicidal ideas. The patient is nervous/anxious.    Vitals:   08/18/16 1320  BP: 113/78  Pulse: 92  Resp: 18  SpO2: 100%  Weight: 261 lb 2 oz (118.4 kg)  Height: 6' (1.829 m)   Wt Readings from Last 3 Encounters:  08/18/16 261 lb 2 oz (118.4 kg)  08/04/16 248 lb 4.8 oz (112.6 kg)  06/03/16 285 lb (129.3 kg)   Lab Results  Component Value Date   CREATININE 0.80 08/03/2016   CREATININE 0.77 08/01/2016   CREATININE 0.75 07/31/2016    Physical Exam  Constitutional: He is oriented to person, place, and time. He appears well-developed and well-nourished.  HENT:  Head: Normocephalic and atraumatic.  Eyes: Conjunctivae are normal. Pupils are equal, round, and reactive to light.  Neck: Normal range of motion. Neck supple. No JVD present.  Cardiovascular: Normal rate.  An irregular rhythm present.  Pulmonary/Chest: Effort normal. He has no wheezes. He has no rales.  Abdominal: Soft. He exhibits no distension. There is no tenderness.  Musculoskeletal: He exhibits no edema or tenderness.  Neurological: He is alert and oriented to person, place, and time.  Skin: Skin is warm and dry.  Psychiatric: He has a normal mood and affect. His behavior is normal. Thought content normal.  Nursing note and vitals reviewed.   Assessment & Plan:  1: Chronic heart failure with reduced ejection fraction- - NYHA class III - euvolemic today -  scales given and patient instructed to weigh every morning, write the weight down and call for an overnight weight gain of >2 pounds or a weekly weight gain of >5 pounds - not adding salt but does like ramen noodles. Reviewed the importance of closely following a 2000mg  sodium diet and written dietary information was given to him about this.  - decrease naproxyn to 2 tablets daily (from 4 tablets daily) and best to avoid taking it at all. Can try ES Tylenol - discussed changing his losartan to entresto at his next office visit - sees cardiologist Kirke Corin) 08/30/16  2: HTN- - BP looks good today - waiting on PCP's office to reschedule appointment due to snow. Advised daughter to call the PCP's office back to get it rescheduled.  3: Atrial fibrillation- - currently on apixaban, digoxin and metoprolol XL  4: Tobacco use- - smoking 2-3 cigarettes daily - complete cessation discussed for 3 minutes with him  5: Snoring- - does endorse snoring and feeling fatigued throughout  the day - will send an order for a sleep study  Return in 1 month or sooner for any questions/problems before then.

## 2016-08-18 NOTE — Patient Instructions (Signed)
Begin weighing daily and call for an overnight weight gain of > 2 pounds or a weekly weight gain of >5 pounds. 

## 2016-08-19 DIAGNOSIS — Z720 Tobacco use: Secondary | ICD-10-CM | POA: Insufficient documentation

## 2016-08-19 DIAGNOSIS — R0683 Snoring: Secondary | ICD-10-CM | POA: Insufficient documentation

## 2016-08-19 DIAGNOSIS — I5022 Chronic systolic (congestive) heart failure: Secondary | ICD-10-CM | POA: Insufficient documentation

## 2016-08-30 ENCOUNTER — Ambulatory Visit (INDEPENDENT_AMBULATORY_CARE_PROVIDER_SITE_OTHER): Payer: Self-pay | Admitting: Cardiovascular Disease

## 2016-08-30 ENCOUNTER — Encounter: Payer: Self-pay | Admitting: Cardiovascular Disease

## 2016-08-30 VITALS — BP 140/94 | HR 86 | Ht 72.0 in

## 2016-08-30 DIAGNOSIS — I10 Essential (primary) hypertension: Secondary | ICD-10-CM

## 2016-08-30 DIAGNOSIS — I4819 Other persistent atrial fibrillation: Secondary | ICD-10-CM

## 2016-08-30 DIAGNOSIS — I481 Persistent atrial fibrillation: Secondary | ICD-10-CM

## 2016-08-30 DIAGNOSIS — I5022 Chronic systolic (congestive) heart failure: Secondary | ICD-10-CM

## 2016-08-30 NOTE — Progress Notes (Signed)
Cardiology Office Note   Date:  08/30/2016   ID:  Eugene Henderson, DOB Oct 31, 1950, MRN [id number]  PCP:  No PCP Per Patient  Cardiologist:   Lorine Bears, MD   Chief Complaint  Patient presents with  . other    NP. Follow up from Heart cath. Pt states he is doing well, just wants to be checked out. Reviewed meds with pt verbally.      History of Present Illness: Eugene Henderson is a 66 y.o. male who presents for A follow-up visit regarding chronic systolic heart failure due to nonischemic cardiomyopathy and atrial fibrillation. The patient was hospitalized in February with acute systolic heart failure in the setting of atrial fibrillation with rapid ventricular response. He is known to have history of hyperthyroidism and was not taking treatment for that due to financial reasons. He had an echocardiogram done which showed an ejection fraction of 25-30%, moderate mitral regurgitation and moderate pulmonary hypertension. I performed cardiac catheterization which showed no significant coronary artery disease. He was started on medical therapy for heart failure and anticoagulation for atrial fibrillation with Eliquis. The patient does not have prescription coverage and ran out of his medication this past weekend. He reports gradual improvement in shortness of breath and no chest pain. He is following with the heart failure clinic.    Past Medical History:  Diagnosis Date  . Arthritis   . Asthma   . CHF (congestive heart failure) (HCC)   . Hypertension   . Hyperthyroidism   . Noncompliance   . Persistent atrial fibrillation [facility])     Past Surgical History:  Procedure Laterality Date  . JOINT REPLACEMENT    . RIGHT/LEFT HEART CATH AND CORONARY ANGIOGRAPHY N/A 08/02/2016   Procedure: Right/Left Heart Cath and Coronary Angiography;  Surgeon: Iran Ouch, MD;  Location: [facility] INVASIVE CV LAB;  Service: Cardiovascular;  Laterality: N/A;     Current Outpatient Prescriptions    Medication Sig Dispense Refill  . apixaban (ELIQUIS) 5 MG TABS tablet Take 1 tablet (5 mg total) by mouth 2 (two) times daily. 60 tablet 5  . aspirin EC 81 MG tablet Take 81 mg by mouth daily.    . digoxin (LANOXIN) 0.125 MG tablet Take 1 tablet (0.125 mg total) by mouth daily. 30 tablet 5  . furosemide (LASIX) 40 MG tablet Take 1 tablet (40 mg total) by mouth 2 (two) times daily. 60 tablet 5  . losartan (COZAAR) 25 MG tablet Take 0.5 tablets (12.5 mg total) by mouth daily. 30 tablet 5  . magnesium oxide (MAG-OX) 400 (241.3 Mg) MG tablet Take 1 tablet (400 mg total) by mouth daily. 30 tablet 0  . methimazole (TAPAZOLE) 10 MG tablet Take 2 tablets (20 mg total) by mouth 2 (two) times daily. 60 tablet 0  . metoprolol (TOPROL-XL) 200 MG 24 hr tablet Take 1 tablet (200 mg total) by mouth daily. Take with or immediately following a meal. 30 tablet 5  . naproxen sodium (ANAPROX) 220 MG tablet Take 220 mg by mouth 2 (two) times daily with a meal.    . spironolactone (ALDACTONE) 25 MG tablet Take 0.5 tablets (12.5 mg total) by mouth daily. 30 tablet 5  . traMADol (ULTRAM) 50 MG tablet Take 1 tablet (50 mg total) by mouth every 6 (six) hours as needed. 10 tablet 0   No current facility-administered medications for this visit.     Allergies:   Patient has no known allergies.    Social History:  The patient  reports that he has been smoking Cigarettes.  He has a 6.25 pack-year smoking history. He has never used smokeless tobacco. He reports that he drinks alcohol. He reports that he does not use drugs.   Family History:  The patient's family history includes Diabetes in his brother, father, and mother.    ROS:  Please see the history of present illness.   Otherwise, review of systems are positive for none.   All other systems are reviewed and negative.    PHYSICAL EXAM: VS:  BP (!) 140/94 (BP Location: Right Arm, Patient Position: Sitting, Cuff Size: Normal)   Pulse 86   Ht 6' (1.829 m)  , BMI  There is no height or weight on file to calculate BMI. GEN: Well nourished, well developed, in no acute distress  HEENT: normal  Neck: no JVD, carotid bruits, or masses Cardiac: Irregularly irregular; no murmurs, rubs, or gallops,no edema  Respiratory:  clear to auscultation bilaterally, normal work of breathing GI: soft, nontender, nondistended, + BS MS: no deformity or atrophy  Skin: warm and dry, no rash Neuro:  Strength and sensation are intact Psych: euthymic mood, full affect   EKG:  EKG is ordered today. The ekg ordered today demonstrates  atrial fibrillation with PVCs and nonspecific T wave changes.   Recent Labs: 07/28/2016: B Natriuretic Peptide 526.0 07/31/2016: TSH <0.010 08/01/2016: Magnesium 1.8 08/02/2016: Hemoglobin 15.1; Platelets 169 08/03/2016: BUN 15; Creatinine, Ser 0.80; Potassium 4.1; Sodium 132    Lipid Panel No results found for: CHOL, TRIG, HDL, CHOLHDL, VLDL, LDLCALC, LDLDIRECT    Wt Readings from Last 3 Encounters:  08/18/16 261 lb 2 oz (118.4 kg)  08/04/16 248 lb 4.8 oz (112.6 kg)  06/03/16 285 lb (129.3 kg)      PAD Screen 08/30/2016  Previous PAD dx? No  Previous surgical procedure? No  Pain with walking? Yes  Subsides with rest? Yes  Feet/toe relief with dangling? Yes  Painful, non-healing ulcers? No  Extremities discolored? No      ASSESSMENT AND PLAN:  1.  Chronic systolic heart failure: Ejection fraction was severely reduced. His cardiomyopathy is likely tachycardia-induced in the setting of atrial fibrillation and hyperthyroidism. He continues to be in [place] Heart Association class III. I'm extremely concerned about his adherence to medical therapy as he ran out of his medications this past weekend. He was already provided with refills through the heart failure clinic and he mentioned that his wife is going to obtain his medications. I discussed with him the importance of not finding out if these medications. Financial difficulties  continues to be a big problem.  2. Essential hypertension: Blood pressure is mildly elevated likely due to running out of medications.  3. Persistent atrial fibrillation: Not able to proceed with cardioversion at the present time as his anticoagulation was interrupted. Continue rate control with metoprolol and small dose digoxin. He is on long-term anticoagulation with Eliquis.   Disposition:   FU with me in 2 months  Signed,  Lorine Bears, MD  08/30/2016 3:43 PM    [facility]

## 2016-08-30 NOTE — Patient Instructions (Signed)
Medication Instructions:  Your physician has recommended you make the following change in your medication:  STOP taking aspirin   Labwork: none  Testing/Procedures: none  Follow-Up: Your physician recommends that you schedule a follow-up appointment in: 2 months with Dr. Arida.    Any Other Special Instructions Will Be Listed Below (If Applicable).     If you need a refill on your cardiac medications before your next appointment, please call your pharmacy.   

## 2016-08-31 ENCOUNTER — Telehealth: Payer: Self-pay | Admitting: Cardiovascular Disease

## 2016-08-31 NOTE — Telephone Encounter (Signed)
Pt is unsure of current medications and would like assistance regarding medication list. Per Marchelle Folks at [facility], Eliquis, digoxin, lasix, losartan, metoprolol and spironolactone are filled and have been ready for pick up since March 14.  Digoxin last filled August 2017; metoprolol Sept 2016.  Pt was given printed prescription for apixaban, spironolactone, magnesium and metoprolol at February hospital discharge. It does not appear these were ever filled.  Refills for the above except magnesium submitted by [facility].  S/w pt's daughter Toniann Fail who states pt has been approved for Medicaid. She tried to pick up medications last week but was told insurance was pending.  Informed daughter per [facility], medications ready for pick up. She pick them up today. Free 30 day Eliquis card at front desk for pickup.  Will make Dr. Kirke Corin aware.

## 2016-09-22 ENCOUNTER — Ambulatory Visit: Payer: Medicare (Managed Care) | Admitting: Family

## 2016-09-28 ENCOUNTER — Ambulatory Visit: Payer: Medicare (Managed Care) | Attending: Family | Admitting: Family

## 2016-09-28 ENCOUNTER — Encounter: Payer: Self-pay | Admitting: Family

## 2016-09-28 VITALS — BP 151/80 | HR 92 | Resp 20 | Ht 72.0 in | Wt 273.4 lb

## 2016-09-28 DIAGNOSIS — R0683 Snoring: Secondary | ICD-10-CM | POA: Diagnosis not present

## 2016-09-28 DIAGNOSIS — I481 Persistent atrial fibrillation: Secondary | ICD-10-CM | POA: Diagnosis not present

## 2016-09-28 DIAGNOSIS — I110 Hypertensive heart disease with heart failure: Secondary | ICD-10-CM | POA: Diagnosis not present

## 2016-09-28 DIAGNOSIS — I5022 Chronic systolic (congestive) heart failure: Secondary | ICD-10-CM

## 2016-09-28 DIAGNOSIS — Z7901 Long term (current) use of anticoagulants: Secondary | ICD-10-CM | POA: Diagnosis not present

## 2016-09-28 DIAGNOSIS — J45909 Unspecified asthma, uncomplicated: Secondary | ICD-10-CM | POA: Diagnosis not present

## 2016-09-28 DIAGNOSIS — M1990 Unspecified osteoarthritis, unspecified site: Secondary | ICD-10-CM | POA: Insufficient documentation

## 2016-09-28 DIAGNOSIS — F17210 Nicotine dependence, cigarettes, uncomplicated: Secondary | ICD-10-CM | POA: Diagnosis not present

## 2016-09-28 DIAGNOSIS — E0590 Thyrotoxicosis, unspecified without thyrotoxic crisis or storm: Secondary | ICD-10-CM | POA: Insufficient documentation

## 2016-09-28 DIAGNOSIS — I10 Essential (primary) hypertension: Secondary | ICD-10-CM

## 2016-09-28 DIAGNOSIS — Z720 Tobacco use: Secondary | ICD-10-CM

## 2016-09-28 DIAGNOSIS — Z9114 Patient's other noncompliance with medication regimen: Secondary | ICD-10-CM | POA: Diagnosis not present

## 2016-09-28 DIAGNOSIS — I509 Heart failure, unspecified: Secondary | ICD-10-CM | POA: Diagnosis not present

## 2016-09-28 DIAGNOSIS — I4819 Other persistent atrial fibrillation: Secondary | ICD-10-CM

## 2016-09-28 MED ORDER — APIXABAN 5 MG PO TABS: 5.0000 mg | ORAL_TABLET | Freq: Two times a day (BID) | ORAL | 5 refills | Status: DC

## 2016-09-28 MED ORDER — METHIMAZOLE 10 MG PO TABS: 20.0000 mg | ORAL_TABLET | Freq: Two times a day (BID) | ORAL | 5 refills | Status: DC

## 2016-09-28 MED ORDER — SPIRONOLACTONE 25 MG PO TABS: 12.5000 mg | ORAL_TABLET | Freq: Every day | ORAL | 5 refills | Status: DC

## 2016-09-28 MED ORDER — METOPROLOL SUCCINATE ER 200 MG PO TB24: 200.0000 mg | ORAL_TABLET | Freq: Every day | ORAL | 5 refills | Status: DC

## 2016-09-28 MED ORDER — DIGOXIN 125 MCG PO TABS: 0.1250 mg | ORAL_TABLET | Freq: Every day | ORAL | 5 refills | Status: DC

## 2016-09-28 MED ORDER — FUROSEMIDE 40 MG PO TABS: 40.0000 mg | ORAL_TABLET | Freq: Two times a day (BID) | ORAL | 5 refills | Status: DC

## 2016-09-28 MED ORDER — SACUBITRIL-VALSARTAN 24-26 MG PO TABS: 1.0000 | ORAL_TABLET | Freq: Two times a day (BID) | ORAL | 5 refills | Status: DC

## 2016-09-28 NOTE — Progress Notes (Signed)
Patient ID: Eugene Henderson, male    DOB: 1950/12/01, 66 y.o.   MRN: [id number]  HPI  Eugene Henderson is a 66 y/o male with a history of atrial fibrillation, hyperthyroidism, HTN, asthma, arthritis, current tobacco use and chronic heart failure.   Last echo was done 07/29/16 and showed an EF of 25-30% along with moderate Eugene and moderately increased PA pressure of 50 mm Hg. 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   Admitted 07/28/16 due to acute heart failure. Initially treated with IV diuretics and transitioned to oral diuretics. Elevated troponin thought to be due to demand ischemia. Also had new onset a fib and medications were started/adjusted. Discharged home after one week. Was in the ED on 06/03/16 with chest pain. Treated and released.   He presents today for his follow-up appointment with a chief complaint of moderate fatigue with minimal exertion. He describes it as chronic in nature and has been occurring for the last several months. When he gets tired, he'll stop what he's doing to rest and his energy level slowly returns. He has associated chest tightness, shortness of breath, palpitations and gradual weight gain. He says that he's been out of his digoxin and losartan for the last week.   Past Medical History:  Diagnosis Date  . Arthritis   . Asthma   . CHF (congestive heart failure) (HCC)   . Hypertension   . Hyperthyroidism   . Noncompliance   . Persistent atrial fibrillation [facility])    Past Surgical History:  Procedure Laterality Date  . JOINT REPLACEMENT    . RIGHT/LEFT HEART CATH AND CORONARY ANGIOGRAPHY N/A 08/02/2016   Procedure: Right/Left Heart Cath and Coronary Angiography;  Surgeon: Iran Ouch, MD;  Location: [facility] INVASIVE CV LAB;  Service: Cardiovascular;  Laterality: N/A;   Family History  Problem Relation Age of Onset  . Diabetes Mother   . Diabetes Father   . Diabetes Brother     Social History  Substance Use Topics  . Smoking status: Current Some Day Smoker    Packs/day: 0.25    Years: 25.00    Types: Cigarettes  . Smokeless tobacco: Never Used  . Alcohol use Yes     Comment: Socially - once a month   No Known Allergies  Prior to Admission medications   Medication Sig Start Date End Date Taking? Authorizing Provider  apixaban (ELIQUIS) 5 MG TABS tablet Take 1 tablet (5 mg total) by mouth 2 (two) times daily. 08/18/16  Yes Delma Freeze, FNP  digoxin (LANOXIN) 0.125 MG tablet Take 1 tablet (0.125 mg total) by mouth daily. 08/18/16  Yes Delma Freeze, FNP  furosemide (LASIX) 40 MG tablet Take 1 tablet (40 mg total) by mouth 2 (two) times daily. 08/18/16  Yes Delma Freeze, FNP  losartan (COZAAR) 25 MG tablet Take 0.5 tablets (12.5 mg total) by mouth daily. 08/18/16  Yes Delma Freeze, FNP  magnesium oxide (MAG-OX) 400 (241.3 Mg) MG tablet Take 1 tablet (400 mg total) by mouth daily. 08/02/16  Yes Srikar Sudini, MD  methimazole (TAPAZOLE) 10 MG tablet Take 2 tablets (20 mg total) by mouth 2 (two) times daily. 08/02/16  Yes Srikar Sudini, MD  metoprolol (TOPROL-XL) 200 MG 24 hr tablet Take 1 tablet (200 mg total) by mouth daily. Take with or immediately following a meal. 08/18/16  Yes Delma Freeze, FNP  naproxen sodium (ANAPROX) 220 MG tablet Take 220 mg  by mouth 2 (two) times daily with a meal.   Yes Historical Provider, MD  spironolactone (ALDACTONE) 25 MG tablet Take 0.5 tablets (12.5 mg total) by mouth daily. 08/18/16  Yes Delma Freeze, FNP  traMADol (ULTRAM) 50 MG tablet Take 1 tablet (50 mg total) by mouth every 6 (six) hours as needed. Patient not taking: Reported on 07/28/2016 06/03/16 06/03/17  Charlynne Pander, MD    Review of Systems  Constitutional: Positive for fatigue. Negative for appetite change.  HENT: Negative for congestion, postnasal drip and sore throat.   Eyes: Negative.   Respiratory: Positive for chest tightness and shortness of breath.  Negative for cough.   Cardiovascular: Positive for palpitations. Negative for chest pain and leg swelling.  Gastrointestinal: Negative for abdominal distention and abdominal pain.  Endocrine: Negative.   Genitourinary: Negative.   Musculoskeletal: Positive for arthralgias (knee) and back pain.  Skin: Negative.   Allergic/Immunologic: Negative.   Neurological: Negative for dizziness and facial asymmetry.  Hematological: Negative for adenopathy. Does not bruise/bleed easily.  Psychiatric/Behavioral: Positive for sleep disturbance (interrrupted; sleeping on 2 pillows; ). Negative for dysphoric mood and suicidal ideas. The patient is nervous/anxious.    Vitals:   09/28/16 1036  BP: (!) 151/80  Pulse: 92  Resp: 20  SpO2: 100%  Weight: 273 lb 6 oz (124 kg)  Height: 6' (1.829 m)   Wt Readings from Last 3 Encounters:  09/28/16 273 lb 6 oz (124 kg)  08/18/16 261 lb 2 oz (118.4 kg)  08/04/16 248 lb 4.8 oz (112.6 kg)   Lab Results  Component Value Date   CREATININE 0.80 08/03/2016   CREATININE 0.77 08/01/2016   CREATININE 0.75 07/31/2016    Physical Exam  Constitutional: He is oriented to person, place, and time. He appears well-developed and well-nourished.  HENT:  Head: Normocephalic and atraumatic.  Neck: Normal range of motion. Neck supple. No JVD present.  Cardiovascular: Normal rate.  An irregular rhythm present.  Pulmonary/Chest: Effort normal. He has no wheezes. He has no rales.  Abdominal: Soft. He exhibits no distension. There is no tenderness.  Musculoskeletal: He exhibits no edema or tenderness.  Neurological: He is alert and oriented to person, place, and time.  Skin: Skin is warm and dry.  Psychiatric: He has a normal mood and affect. His behavior is normal. Thought content normal.  Nursing note and vitals reviewed.   Assessment & Plan:  1: Chronic heart failure with reduced ejection fraction- - NYHA class III - euvolemic today - weight gradually going up.  Weight up 12 pounds since he was here last month. He is to call for an overnight weight gain of >2 pounds or a weekly weight gain of >5 pounds - is adding "some" salt to his food and he was instructed to not add any salt to any food. Reviewed the importance of closely following a 2000mg  sodium diet.  - has been out of losartan for the last week so will go ahead and begin entresto 24/26mg  twice daily. 30 day voucher given to patient. Also gave 28 samples of entresto 24/26mg  (Lot [id number]/ EXP 7/18) - toprol XL at max dose already - will check a BMP in one month - saw cardiologist Kirke Corin) 08/30/16  2: HTN- - BP looks good today - has PCP appointment rescheduled to 10/28/16  3: Atrial fibrillation- - currently on apixaban, digoxin and metoprolol XL - although has been out of digoxin for the last week; new prescription sent int  4: Tobacco use- -  smoking 2-3 cigarettes daily - complete cessation discussed for 3 minutes with him  5: Snoring- - does endorse snoring and feeling fatigued throughout the day - has to r/s the sleep study as it got cancelled due to weather  Return in 1 month or sooner for any questions/problems before then.

## 2016-09-28 NOTE — Patient Instructions (Addendum)
Do not take anymore losartan. Am replacing it with entresto 24/26mg  twice daily.    Continue weighing daily and call for an overnight weight gain of > 2 pounds or a weekly weight gain of >5 pounds.  Smoking Cessation Quitting smoking is important to your health and has many advantages. However, it is not always easy to quit since nicotine is a very addictive drug. Oftentimes, people try 3 times or more before being able to quit. This document explains the best ways for you to prepare to quit smoking. Quitting takes hard work and a lot of effort, but you can do it. ADVANTAGES OF QUITTING SMOKING  You will live longer, feel better, and live better.  Your body will feel the impact of quitting smoking almost immediately.  Within 20 minutes, blood pressure decreases. Your pulse returns to its normal level.  After 8 hours, carbon monoxide levels in the blood return to normal. Your oxygen level increases.  After 24 hours, the chance of having a heart attack starts to decrease. Your breath, hair, and body stop smelling like smoke.  After 48 hours, damaged nerve endings begin to recover. Your sense of taste and smell improve.  After 72 hours, the body is virtually free of nicotine. Your bronchial tubes relax and breathing becomes easier.  After 2 to 12 weeks, lungs can hold more air. Exercise becomes easier and circulation improves.  The risk of having a heart attack, stroke, cancer, or lung disease is greatly reduced.  After 1 year, the risk of coronary heart disease is cut in half.  After 5 years, the risk of stroke falls to the same as a nonsmoker.  After 10 years, the risk of lung cancer is cut in half and the risk of other cancers decreases significantly.  After 15 years, the risk of coronary heart disease drops, usually to the level of a nonsmoker.  If you are pregnant, quitting smoking will improve your chances of having a healthy baby.  The people you live with, especially any  children, will be healthier.  You will have extra money to spend on things other than cigarettes. QUESTIONS TO THINK ABOUT BEFORE ATTEMPTING TO QUIT You may want to talk about your answers with your health care provider.  Why do you want to quit?  If you tried to quit in the past, what helped and what did not?  What will be the most difficult situations for you after you quit? How will you plan to handle them?  Who can help you through the tough times? Your family? Friends? A health care provider?  What pleasures do you get from smoking? What ways can you still get pleasure if you quit? Here are some questions to ask your health care provider:  How can you help me to be successful at quitting?  What medicine do you think would be best for me and how should I take it?  What should I do if I need more help?  What is smoking withdrawal like? How can I get information on withdrawal? GET READY  Set a quit date.  Change your environment by getting rid of all cigarettes, ashtrays, matches, and lighters in your home, car, or work. Do not let people smoke in your home.  Review your past attempts to quit. Think about what worked and what did not. GET SUPPORT AND ENCOURAGEMENT You have a better chance of being successful if you have help. You can get support in many ways.  Tell your family,  friends, and coworkers that you are going to quit and need their support. Ask them not to smoke around you.  Get individual, group, or telephone counseling and support. Programs are available at [employer] and health centers. Call your local health department for information about programs in your area.  Spiritual beliefs and practices may help some smokers quit.  Download a "quit meter" on your computer to keep track of quit statistics, such as how long you have gone without smoking, cigarettes not smoked, and money saved.  Get a self-help book about quitting smoking and staying off  tobacco. [place] yourself from urges to smoke. Talk to someone, go for a walk, or occupy your time with a task.  Change your normal routine. Take a different route to work. Drink tea instead of coffee. Eat breakfast in a different place.  Reduce your stress. Take a hot bath, exercise, or read a book.  Plan something enjoyable to do every day. Reward yourself for not smoking.  Explore interactive web-based programs that specialize in helping you quit. GET MEDICINE AND USE IT CORRECTLY Medicines can help you stop smoking and decrease the urge to smoke. Combining medicine with the above behavioral methods and support can greatly increase your chances of successfully quitting smoking.  Nicotine replacement therapy helps deliver nicotine to your body without the negative effects and risks of smoking. Nicotine replacement therapy includes nicotine gum, lozenges, inhalers, nasal sprays, and skin patches. Some may be available over-the-counter and others require a prescription.  Antidepressant medicine helps people abstain from smoking, but how this works is unknown. This medicine is available by prescription.  Nicotinic receptor partial agonist medicine simulates the effect of nicotine in your brain. This medicine is available by prescription. Ask your health care provider for advice about which medicines to use and how to use them based on your health history. Your health care provider will tell you what side effects to look out for if you choose to be on a medicine or therapy. Carefully read the information on the package. Do not use any other product containing nicotine while using a nicotine replacement product.  RELAPSE OR DIFFICULT SITUATIONS Most relapses occur within the first 3 months after quitting. Do not be discouraged if you start smoking again. Remember, most people try several times before finally quitting. You may have symptoms of withdrawal because  your body is used to nicotine. You may crave cigarettes, be irritable, feel very hungry, cough often, get headaches, or have difficulty concentrating. The withdrawal symptoms are only temporary. They are strongest when you first quit, but they will go away within 10-14 days. To reduce the chances of relapse, try to:  Avoid drinking alcohol. Drinking lowers your chances of successfully quitting.  Reduce the amount of caffeine you consume. Once you quit smoking, the amount of caffeine in your body increases and can give you symptoms, such as a rapid heartbeat, sweating, and anxiety.  Avoid smokers because they can make you want to smoke.  Do not let weight gain distract you. Many smokers will gain weight when they quit, usually less than 10 pounds. Eat a healthy diet and stay active. You can always lose the weight gained after you quit.  Find ways to improve your mood other than smoking. FOR MORE INFORMATION  www.smokefree.gov  Document Released: 05/18/2001 Document Revised: 10/08/2013 Document Reviewed: 09/02/2011 [facility] Patient Information 2015 [place], [place]. This information is not intended to replace advice given to you by  your health care provider. Make sure you discuss any questions you have with your health care provider.  

## 2016-09-30 ENCOUNTER — Ambulatory Visit: Payer: Self-pay | Admitting: Family

## 2016-10-02 DIAGNOSIS — I5022 Chronic systolic (congestive) heart failure: Secondary | ICD-10-CM | POA: Diagnosis not present

## 2016-10-02 DIAGNOSIS — I110 Hypertensive heart disease with heart failure: Secondary | ICD-10-CM | POA: Diagnosis not present

## 2016-10-05 ENCOUNTER — Telehealth: Payer: Self-pay | Admitting: Cardiovascular Disease

## 2016-10-05 NOTE — Telephone Encounter (Signed)
Eugene Henderson from [facility] also calling for nursing orders.  Please call  .  [phone] orders to be faxed as well.  Needs order for nursing 2 x week for first week then 1 x week for 2 weeks  CH[employer] Education

## 2016-10-05 NOTE — Telephone Encounter (Signed)
Patient needs 1 x week for 2 weeks occupational therapy .  Ok to [employer] when goals met   Please call .    For hf education and exercise.

## 2016-10-06 NOTE — Telephone Encounter (Signed)
That's fine

## 2016-10-06 NOTE — Telephone Encounter (Signed)
Received incoming call from [facility] [place]. Let him know Dr Kirke Corin agreed to orders for nursing and occupational therapy for the patient.

## 2016-10-06 NOTE — Telephone Encounter (Signed)
No answer. Left message to call back.   

## 2016-10-08 ENCOUNTER — Ambulatory Visit: Payer: Self-pay | Admitting: Pharmacy Technician

## 2016-10-11 NOTE — Progress Notes (Signed)
Patient scheduled for eligibility appointment at [facility].  Patient did not show for the appointment on Oct 11, 2016 at 10:30a.m.  Patient did not reschedule eligibility appointment.  [facility] unable to provide additional medication assistance until eligibility is determined.  Patient is 66 years-old.  Should have Medicare.  Sending letter informing patient on how to be screened for a Medicare Savings Plan and for L.I.S.  Sherilyn Dacosta Care Manager [facility]

## 2016-10-28 ENCOUNTER — Ambulatory Visit: Payer: Medicaid Other | Admitting: Family

## 2016-11-02 ENCOUNTER — Encounter: Payer: Self-pay | Admitting: Family

## 2016-11-02 ENCOUNTER — Ambulatory Visit: Payer: Medicare (Managed Care) | Attending: Family | Admitting: Family

## 2016-11-02 ENCOUNTER — Encounter: Payer: Self-pay | Admitting: Cardiovascular Disease

## 2016-11-02 ENCOUNTER — Ambulatory Visit (INDEPENDENT_AMBULATORY_CARE_PROVIDER_SITE_OTHER): Payer: Medicare (Managed Care) | Admitting: Cardiovascular Disease

## 2016-11-02 VITALS — BP 148/102 | HR 97 | Ht 72.0 in | Wt 274.0 lb

## 2016-11-02 VITALS — BP 181/96 | HR 67 | Resp 22 | Ht 72.0 in | Wt 277.2 lb

## 2016-11-02 DIAGNOSIS — I110 Hypertensive heart disease with heart failure: Secondary | ICD-10-CM | POA: Diagnosis not present

## 2016-11-02 DIAGNOSIS — I4819 Other persistent atrial fibrillation: Secondary | ICD-10-CM

## 2016-11-02 DIAGNOSIS — Z833 Family history of diabetes mellitus: Secondary | ICD-10-CM | POA: Diagnosis not present

## 2016-11-02 DIAGNOSIS — I481 Persistent atrial fibrillation: Secondary | ICD-10-CM | POA: Diagnosis not present

## 2016-11-02 DIAGNOSIS — I5022 Chronic systolic (congestive) heart failure: Secondary | ICD-10-CM | POA: Diagnosis present

## 2016-11-02 DIAGNOSIS — J45909 Unspecified asthma, uncomplicated: Secondary | ICD-10-CM | POA: Insufficient documentation

## 2016-11-02 DIAGNOSIS — F17210 Nicotine dependence, cigarettes, uncomplicated: Secondary | ICD-10-CM | POA: Diagnosis not present

## 2016-11-02 DIAGNOSIS — I10 Essential (primary) hypertension: Secondary | ICD-10-CM

## 2016-11-02 DIAGNOSIS — Z7901 Long term (current) use of anticoagulants: Secondary | ICD-10-CM | POA: Insufficient documentation

## 2016-11-02 DIAGNOSIS — M1990 Unspecified osteoarthritis, unspecified site: Secondary | ICD-10-CM | POA: Diagnosis not present

## 2016-11-02 DIAGNOSIS — R0683 Snoring: Secondary | ICD-10-CM | POA: Insufficient documentation

## 2016-11-02 DIAGNOSIS — Z79899 Other long term (current) drug therapy: Secondary | ICD-10-CM | POA: Diagnosis not present

## 2016-11-02 DIAGNOSIS — Z720 Tobacco use: Secondary | ICD-10-CM

## 2016-11-02 DIAGNOSIS — I4891 Unspecified atrial fibrillation: Secondary | ICD-10-CM | POA: Insufficient documentation

## 2016-11-02 NOTE — Patient Instructions (Signed)
Medication Instructions: Continue same medications.   Labwork: None.   Procedures/Testing: Echo in 2 months  Follow-Up: 4 months with Dr. Kirke Corin.   Any Additional Special Instructions Will Be Listed Below (If Applicable).     If you need a refill on your cardiac medications before your next appointment, please call your pharmacy.

## 2016-11-02 NOTE — Patient Instructions (Signed)
Continue weighing daily and call for an overnight weight gain of > 2 pounds or a weekly weight gain of >5 pounds.    Smoking Cessation Quitting smoking is important to your health and has many advantages. However, it is not always easy to quit since nicotine is a very addictive drug. Oftentimes, people try 3 times or more before being able to quit. This document explains the best ways for you to prepare to quit smoking. Quitting takes hard work and a lot of effort, but you can do it. ADVANTAGES OF QUITTING SMOKING  You will live longer, feel better, and live better.  Your body will feel the impact of quitting smoking almost immediately.  Within 20 minutes, blood pressure decreases. Your pulse returns to its normal level.  After 8 hours, carbon monoxide levels in the blood return to normal. Your oxygen level increases.  After 24 hours, the chance of having a heart attack starts to decrease. Your breath, hair, and body stop smelling like smoke.  After 48 hours, damaged nerve endings begin to recover. Your sense of taste and smell improve.  After 72 hours, the body is virtually free of nicotine. Your bronchial tubes relax and breathing becomes easier.  After 2 to 12 weeks, lungs can hold more air. Exercise becomes easier and circulation improves.  The risk of having a heart attack, stroke, cancer, or lung disease is greatly reduced.  After 1 year, the risk of coronary heart disease is cut in half.  After 5 years, the risk of stroke falls to the same as a nonsmoker.  After 10 years, the risk of lung cancer is cut in half and the risk of other cancers decreases significantly.  After 15 years, the risk of coronary heart disease drops, usually to the level of a nonsmoker.  If you are pregnant, quitting smoking will improve your chances of having a healthy baby.  The people you live with, especially any children, will be healthier.  You will have extra money to spend on things other  than cigarettes. QUESTIONS TO THINK ABOUT BEFORE ATTEMPTING TO QUIT You may want to talk about your answers with your health care provider.  Why do you want to quit?  If you tried to quit in the past, what helped and what did not?  What will be the most difficult situations for you after you quit? How will you plan to handle them?  Who can help you through the tough times? Your family? Friends? A health care provider?  What pleasures do you get from smoking? What ways can you still get pleasure if you quit? Here are some questions to ask your health care provider:  How can you help me to be successful at quitting?  What medicine do you think would be best for me and how should I take it?  What should I do if I need more help?  What is smoking withdrawal like? How can I get information on withdrawal? GET READY  Set a quit date.  Change your environment by getting rid of all cigarettes, ashtrays, matches, and lighters in your home, car, or work. Do not let people smoke in your home.  Review your past attempts to quit. Think about what worked and what did not. GET SUPPORT AND ENCOURAGEMENT You have a better chance of being successful if you have help. You can get support in many ways.  Tell your family, friends, and coworkers that you are going to quit and need their support. Ask   them not to smoke around you.  Get individual, group, or telephone counseling and support. Programs are available at local hospitals and health centers. Call your local health department for information about programs in your area.  Spiritual beliefs and practices may help some smokers quit.  Download a "quit meter" on your computer to keep track of quit statistics, such as how long you have gone without smoking, cigarettes not smoked, and money saved.  Get a self-help book about quitting smoking and staying off tobacco. LEARN NEW SKILLS AND BEHAVIORS  Distract yourself from urges to smoke. Talk to  someone, go for a walk, or occupy your time with a task.  Change your normal routine. Take a different route to work. Drink tea instead of coffee. Eat breakfast in a different place.  Reduce your stress. Take a hot bath, exercise, or read a book.  Plan something enjoyable to do every day. Reward yourself for not smoking.  Explore interactive web-based programs that specialize in helping you quit. GET MEDICINE AND USE IT CORRECTLY Medicines can help you stop smoking and decrease the urge to smoke. Combining medicine with the above behavioral methods and support can greatly increase your chances of successfully quitting smoking.  Nicotine replacement therapy helps deliver nicotine to your body without the negative effects and risks of smoking. Nicotine replacement therapy includes nicotine gum, lozenges, inhalers, nasal sprays, and skin patches. Some may be available over-the-counter and others require a prescription.  Antidepressant medicine helps people abstain from smoking, but how this works is unknown. This medicine is available by prescription.  Nicotinic receptor partial agonist medicine simulates the effect of nicotine in your brain. This medicine is available by prescription. Ask your health care provider for advice about which medicines to use and how to use them based on your health history. Your health care provider will tell you what side effects to look out for if you choose to be on a medicine or therapy. Carefully read the information on the package. Do not use any other product containing nicotine while using a nicotine replacement product.  RELAPSE OR DIFFICULT SITUATIONS Most relapses occur within the first 3 months after quitting. Do not be discouraged if you start smoking again. Remember, most people try several times before finally quitting. You may have symptoms of withdrawal because your body is used to nicotine. You may crave cigarettes, be irritable, feel very hungry, cough  often, get headaches, or have difficulty concentrating. The withdrawal symptoms are only temporary. They are strongest when you first quit, but they will go away within 10-14 days. To reduce the chances of relapse, try to:  Avoid drinking alcohol. Drinking lowers your chances of successfully quitting.  Reduce the amount of caffeine you consume. Once you quit smoking, the amount of caffeine in your body increases and can give you symptoms, such as a rapid heartbeat, sweating, and anxiety.  Avoid smokers because they can make you want to smoke.  Do not let weight gain distract you. Many smokers will gain weight when they quit, usually less than 10 pounds. Eat a healthy diet and stay active. You can always lose the weight gained after you quit.  Find ways to improve your mood other than smoking. FOR MORE INFORMATION  www.smokefree.gov  Document Released: 05/18/2001 Document Revised: 10/08/2013 Document Reviewed: 09/02/2011 [employer] Patient Information 2015 [employer]. This information is not intended to replace advice given to you by your health care provider. Make sure you discuss any questions you have with your   health care provider.  

## 2016-11-02 NOTE — Progress Notes (Signed)
Patient ID: Eugene Henderson, male    DOB: 1950-09-02, 66 y.o.   MRN: [id number]  HPI  Eugene Henderson is a 66 y/o male with a history of atrial fibrillation, hyperthyroidism, HTN, asthma, arthritis, current tobacco use and chronic heart failure.   Last echo was done 07/29/16 and showed an EF of 25-30% along with moderate Eugene and moderately increased PA pressure of 50 mm Hg. 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   Admitted 07/28/16 due to acute heart failure. Initially treated with IV diuretics and transitioned to oral diuretics. Elevated troponin thought to be due to demand ischemia. Also had new onset a fib and medications were started/adjusted. Discharged home after one week. Was in the ED on 06/03/16 with chest pain. Treated and released.   He presents today for his follow-up appointment with a chief complaint of weight gain over the past week greater than 5 pounds. Reports some fatigue and SOB. Denies leg swelling, chest pain, and palpitations. He has been out of his medications for the last two days and has been taking Entresto every once in a while with his losartan. He has used about 7 pills.   Past Medical History:  Diagnosis Date  . Arthritis   . Asthma   . CHF (congestive heart failure) (HCC)   . Hypertension   . Hyperthyroidism   . Noncompliance   . Persistent atrial fibrillation [facility])    Past Surgical History:  Procedure Laterality Date  . JOINT REPLACEMENT    . RIGHT/LEFT HEART CATH AND CORONARY ANGIOGRAPHY N/A 08/02/2016   Procedure: Right/Left Heart Cath and Coronary Angiography;  Surgeon: Iran Ouch, MD;  Location: [facility] INVASIVE CV LAB;  Service: Cardiovascular;  Laterality: N/A;   Family History  Problem Relation Age of Onset  . Diabetes Mother   . Diabetes Father   . Diabetes Brother    Social History  Substance Use Topics  . Smoking status: Current Some Day Smoker    Packs/day:  0.25    Years: 25.00    Types: Cigarettes  . Smokeless tobacco: Never Used  . Alcohol use Yes     Comment: Socially - once a month   No Known Allergies  Prior to Admission medications   Medication Sig Start Date End Date Taking? Authorizing Provider  apixaban (ELIQUIS) 5 MG TABS tablet Take 1 tablet (5 mg total) by mouth 2 (two) times daily. 08/18/16  Yes Delma Freeze, FNP  digoxin (LANOXIN) 0.125 MG tablet Take 1 tablet (0.125 mg total) by mouth daily. 08/18/16  Yes Delma Freeze, FNP  furosemide (LASIX) 40 MG tablet Take 1 tablet (40 mg total) by mouth 2 (two) times daily. 08/18/16  Yes Delma Freeze, FNP  losartan (COZAAR) 25 MG tablet Take 0.5 tablets (12.5 mg total) by mouth daily. 08/18/16  Yes Delma Freeze, FNP  magnesium oxide (MAG-OX) 400 (241.3 Mg) MG tablet Take 1 tablet (400 mg total) by mouth daily. 08/02/16  Yes Srikar Sudini, MD  methimazole (TAPAZOLE) 10 MG tablet Take 2 tablets (20 mg total) by mouth 2 (two) times daily. 08/02/16  Yes Srikar Sudini, MD  metoprolol (TOPROL-XL) 200 MG 24 hr tablet Take 1 tablet (200 mg total) by mouth daily. Take with or immediately following a meal. 08/18/16  Yes Delma Freeze, FNP  naproxen sodium (ANAPROX) 220 MG tablet Take 220 mg by mouth 2 (two) times daily with a meal.   Yes Historical  Provider, MD  spironolactone (ALDACTONE) 25 MG tablet Take 0.5 tablets (12.5 mg total) by mouth daily. 08/18/16  Yes Delma Freeze, FNP  traMADol (ULTRAM) 50 MG tablet Take 1 tablet (50 mg total) by mouth every 6 (six) hours as needed. Patient not taking: Reported on 07/28/2016 06/03/16 06/03/17  Charlynne Pander, MD    Review of Systems  Constitutional: Positive for fatigue. Negative for appetite change.  HENT: Negative for congestion, postnasal drip and sore throat.   Eyes: Negative.   Respiratory: Positive for shortness of breath. Negative for cough and chest tightness.   Cardiovascular: Negative for chest pain, palpitations and leg swelling.   Gastrointestinal: Negative for abdominal distention and abdominal pain.  Endocrine: Negative.   Genitourinary: Negative.   Musculoskeletal: Positive for back pain (Constant pain) and joint swelling (Right knee; has knee brace). Negative for arthralgias (knee).  Skin: Negative.   Allergic/Immunologic: Negative.   Neurological: Negative for dizziness and facial asymmetry.  Hematological: Negative for adenopathy. Does not bruise/bleed easily.  Psychiatric/Behavioral: Positive for sleep disturbance (interrrupted; sleeping on 2 pillows; ). Negative for dysphoric mood and suicidal ideas. The patient is not nervous/anxious.    Vitals:   11/02/16 1307  BP: (!) 181/96  Pulse: 67  Resp: (!) 22  SpO2: 100%  Weight: 277 lb 4 oz (125.8 kg)  Height: 6' (1.829 m)   Wt Readings from Last 3 Encounters:  11/02/16 274 lb (124.3 kg)  11/02/16 277 lb 4 oz (125.8 kg)  09/28/16 273 lb 6 oz (124 kg)    Lab Results  Component Value Date   CREATININE 0.80 08/03/2016   CREATININE 0.77 08/01/2016   CREATININE 0.75 07/31/2016    Physical Exam  Constitutional: He is oriented to person, place, and time. He appears well-developed and well-nourished.  HENT:  Head: Normocephalic and atraumatic.  Neck: Normal range of motion. Neck supple. No JVD present.  Cardiovascular: Normal rate.  An irregular rhythm present.  Pulmonary/Chest: Effort normal. He has no wheezes. He has no rales.  Abdominal: Soft. He exhibits no distension. There is no tenderness.  Musculoskeletal: He exhibits no edema or tenderness.  Neurological: He is alert and oriented to person, place, and time.  Skin: Skin is warm and dry.  Psychiatric: He has a normal mood and affect. His behavior is normal. Thought content normal.  Nursing note and vitals reviewed.   Assessment & Plan:  1: Chronic heart failure with reduced ejection fraction- - NYHA class III - euvolemic today - weight increased about 7 pounds in the last week. Reminded  him to call for an overnight weight gain of >2 pounds or a weekly weight gain of >5 pounds - still using salt; had soup, BBQ, and hot dogs this weekend. Instructed to not add any salt to food. Reminded him to follow a 2000mg  sodium diet.  - has used Entresto intermittently with losartan. Informed patient and daughter Eugene Henderson) who fills his pill box to not use the losartan and Entresto at the same time. No side effects reported at this time.  - continue with Entresto twice a day at this time - cardiologist Kirke Corin) appointment scheduled for today   2: HTN- - BP high today; 181/96 - out of medications for the past two days  - max dose of metoprolol succinate  - PCP appointment scheduled for 11/28/16  3: Atrial fibrillation- - currently on apixaban, digoxin and metoprolol XL  4: Tobacco use- - smoking 2-3 cigarettes daily - patient has used inhalers in the  past, but unsure of which kind - complete cessation discussed for 3 minutes with him  5: Snoring- - still not sleeping well and is fatigued some throughout the day   Reviewed medication list with patient and daughter during visit.   Return in 1 month or sooner for any questions/problems before then.

## 2016-11-02 NOTE — Progress Notes (Signed)
Cardiology Office Note   Date:  11/04/2016   ID:  [name], DOB 01-Nov-1950, MRN [id number]  PCP:  Dorcas Carrow, DO  Cardiologist:   Lorine Bears, MD   Chief Complaint  Patient presents with  . other    2 month f/u c/o sob and knee swelling. Meds reviewed verbally with pt.      History of Present Illness: Eugene Henderson is a 66 y.o. male who presents for a follow-up visit regarding chronic systolic heart failure due to nonischemic cardiomyopathy and atrial fibrillation. The patient was hospitalized in February with acute systolic heart failure in the setting of atrial fibrillation with rapid ventricular response. He is known to have history of hyperthyroidism and was not taking treatment for that due to financial reasons. He had an echocardiogram done which showed an ejection fraction of 25-30%, moderate mitral regurgitation and moderate pulmonary hypertension. I performed cardiac catheterization which showed no significant coronary artery disease. He was started on medical therapy for heart failure and anticoagulation for atrial fibrillation with Eliquis.   He had issues with obtaining medications initially but was able to get Medicaid and has been taking his medications regularly. He has been following with in the heart failure clinic. He was seen today and the plans are to start him on Entresto. He is feeling better overall. He gained weight since March but he attributes that to poor diet choices. He continues to be in atrial fibrillation with reasonably controlled ventricular rate. He denies any chest pain. He reports improvement in exertional dyspnea and orthopnea.   Past Medical History:  Diagnosis Date  . Arthritis   . Asthma   . CHF (congestive heart failure) (HCC)   . Hypertension   . Hyperthyroidism   . Noncompliance   . Persistent atrial fibrillation [facility])     Past Surgical History:  Procedure Laterality Date  . JOINT REPLACEMENT    . RIGHT/LEFT HEART  CATH AND CORONARY ANGIOGRAPHY N/A 08/02/2016   Procedure: Right/Left Heart Cath and Coronary Angiography;  Surgeon: Iran Ouch, MD;  Location: [facility] INVASIVE CV LAB;  Service: Cardiovascular;  Laterality: N/A;     Current Outpatient Prescriptions  Medication Sig Dispense Refill  . apixaban (ELIQUIS) 5 MG TABS tablet Take 1 tablet (5 mg total) by mouth 2 (two) times daily. 60 tablet 5  . digoxin (LANOXIN) 0.125 MG tablet Take 1 tablet (0.125 mg total) by mouth daily. 30 tablet 5  . furosemide (LASIX) 40 MG tablet Take 1 tablet (40 mg total) by mouth 2 (two) times daily. 60 tablet 5  . methimazole (TAPAZOLE) 10 MG tablet Take 2 tablets (20 mg total) by mouth 2 (two) times daily. 60 tablet 5  . metoprolol (TOPROL-XL) 200 MG 24 hr tablet Take 1 tablet (200 mg total) by mouth daily. Take with or immediately following a meal. 30 tablet 5  . sacubitril-valsartan (ENTRESTO) 24-26 MG Take 1 tablet by mouth 2 (two) times daily. 60 tablet 5  . spironolactone (ALDACTONE) 25 MG tablet Take 0.5 tablets (12.5 mg total) by mouth daily. 30 tablet 5  . naproxen sodium (ANAPROX) 220 MG tablet Take 220 mg by mouth 2 (two) times daily with a meal.    . propylthiouracil (PTU) 50 MG tablet Take 50 mg by mouth 3 (three) times daily.    . traMADol (ULTRAM) 50 MG tablet Take 1 tablet (50 mg total) by mouth every 6 (six) hours as needed. (Patient not taking: Reported on 11/02/2016) 10 tablet 0  No current facility-administered medications for this visit.     Allergies:   Patient has no known allergies.    Social History:  The patient  reports that he has been smoking Cigarettes.  He has a 6.25 pack-year smoking history. He has never used smokeless tobacco. He reports that he drinks alcohol. He reports that he does not use drugs.   Family History:  The patient's family history includes Diabetes in his brother, father, and mother.    ROS:  Please see the history of present illness.   Otherwise, review of systems  are positive for none.   All other systems are reviewed and negative.    PHYSICAL EXAM: VS:  BP (!) 148/102 (BP Location: Left Arm, Patient Position: Sitting, Cuff Size: Large)   Pulse 97   Ht 6' (1.829 m)   Wt 274 lb (124.3 kg)   BMI 37.16 kg/m  , BMI Body mass index is 37.16 kg/m. GEN: Well nourished, well developed, in no acute distress  HEENT: normal  Neck: no JVD, carotid bruits, or masses Cardiac: Irregularly irregular; no murmurs, rubs, or gallops,no edema  Respiratory:  clear to auscultation bilaterally, normal work of breathing GI: soft, nontender, nondistended, + BS MS: no deformity or atrophy  Skin: warm and dry, no rash Neuro:  Strength and sensation are intact Psych: euthymic mood, full affect   EKG:  EKG is ordered today. The ekg ordered today demonstrates  atrial fibrillation with PVCs and nonspecific T wave changes. Ventricular rate is 97 bpm.   Recent Labs: 07/28/2016: B Natriuretic Peptide 526.0 07/31/2016: TSH <0.010 08/01/2016: Magnesium 1.8 08/02/2016: Hemoglobin 15.1; Platelets 169 08/03/2016: BUN 15; Creatinine, Ser 0.80; Potassium 4.1; Sodium 132    Lipid Panel No results found for: CHOL, TRIG, HDL, CHOLHDL, VLDL, LDLCALC, LDLDIRECT    Wt Readings from Last 3 Encounters:  11/02/16 274 lb (124.3 kg)  11/02/16 277 lb 4 oz (125.8 kg)  09/28/16 273 lb 6 oz (124 kg)      PAD Screen 08/30/2016  Previous PAD dx? No  Previous surgical procedure? No  Pain with walking? Yes  Subsides with rest? Yes  Feet/toe relief with dangling? Yes  Painful, non-healing ulcers? No  Extremities discolored? No      ASSESSMENT AND PLAN:  1.  Chronic systolic heart failure: Ejection fraction was severely reduced. His cardiomyopathy is likely tachycardia-induced in the setting of atrial fibrillation and hyperthyroidism. He has improved symptomatically and has been more compliant with his medications. He appears to be euvolemic on current dose of furosemide. He is  otherwise on optimal medical therapy and was just started on Entresto by the heart failure clinic. I'm going to repeat his echocardiogram in 2 months from now to reevaluate his EF on optimal medical therapy.  2. Essential hypertension: Blood pressure is mildly elevated but should improve with the addition of Entresto.   3. Persistent atrial fibrillation:  I discussed with him the option of proceeding with cardioversion now that he has been anticoagulated effectively. He is not interested in proceeding with this. He reports improvement in symptoms.  Disposition:   FU with me in 4 months  Signed,  Lorine Bears, MD  11/04/2016 9:45 AM    [facility]

## 2016-11-22 ENCOUNTER — Inpatient Hospital Stay
Admission: EM | Admit: 2016-11-22 | Discharge: 2016-11-26 | DRG: 641 | Disposition: A | Discharge status: Home / Self Care | Payer: Medicare (Managed Care) | Attending: Internal Medicine | Admitting: Internal Medicine

## 2016-11-22 ENCOUNTER — Emergency Department: Payer: Medicare (Managed Care)

## 2016-11-22 ENCOUNTER — Encounter: Payer: Self-pay | Admitting: *Deleted

## 2016-11-22 DIAGNOSIS — E878 Other disorders of electrolyte and fluid balance, not elsewhere classified: Secondary | ICD-10-CM

## 2016-11-22 DIAGNOSIS — F1010 Alcohol abuse, uncomplicated: Secondary | ICD-10-CM | POA: Diagnosis present

## 2016-11-22 DIAGNOSIS — E871 Hypo-osmolality and hyponatremia: Principal | ICD-10-CM | POA: Diagnosis present

## 2016-11-22 DIAGNOSIS — Z791 Long term (current) use of non-steroidal anti-inflammatories (NSAID): Secondary | ICD-10-CM

## 2016-11-22 DIAGNOSIS — E039 Hypothyroidism, unspecified: Secondary | ICD-10-CM | POA: Diagnosis present

## 2016-11-22 DIAGNOSIS — I723 Aneurysm of iliac artery: Secondary | ICD-10-CM | POA: Diagnosis present

## 2016-11-22 DIAGNOSIS — I481 Persistent atrial fibrillation: Secondary | ICD-10-CM | POA: Diagnosis present

## 2016-11-22 DIAGNOSIS — T502X5A Adverse effect of carbonic-anhydrase inhibitors, benzothiadiazides and other diuretics, initial encounter: Secondary | ICD-10-CM | POA: Diagnosis present

## 2016-11-22 DIAGNOSIS — I714 Abdominal aortic aneurysm, without rupture: Secondary | ICD-10-CM | POA: Diagnosis present

## 2016-11-22 DIAGNOSIS — I447 Left bundle-branch block, unspecified: Secondary | ICD-10-CM | POA: Diagnosis present

## 2016-11-22 DIAGNOSIS — I248 Other forms of acute ischemic heart disease: Secondary | ICD-10-CM | POA: Diagnosis present

## 2016-11-22 DIAGNOSIS — I110 Hypertensive heart disease with heart failure: Secondary | ICD-10-CM | POA: Diagnosis present

## 2016-11-22 DIAGNOSIS — I5022 Chronic systolic (congestive) heart failure: Secondary | ICD-10-CM | POA: Diagnosis present

## 2016-11-22 DIAGNOSIS — E0590 Thyrotoxicosis, unspecified without thyrotoxic crisis or storm: Secondary | ICD-10-CM | POA: Diagnosis present

## 2016-11-22 DIAGNOSIS — F17210 Nicotine dependence, cigarettes, uncomplicated: Secondary | ICD-10-CM | POA: Diagnosis present

## 2016-11-22 DIAGNOSIS — E873 Alkalosis: Secondary | ICD-10-CM | POA: Diagnosis present

## 2016-11-22 DIAGNOSIS — E669 Obesity, unspecified: Secondary | ICD-10-CM | POA: Diagnosis present

## 2016-11-22 DIAGNOSIS — Z95828 Presence of other vascular implants and grafts: Secondary | ICD-10-CM

## 2016-11-22 DIAGNOSIS — E876 Hypokalemia: Secondary | ICD-10-CM | POA: Diagnosis not present

## 2016-11-22 DIAGNOSIS — Z6836 Body mass index (BMI) 36.0-36.9, adult: Secondary | ICD-10-CM

## 2016-11-22 DIAGNOSIS — R0789 Other chest pain: Secondary | ICD-10-CM | POA: Diagnosis not present

## 2016-11-22 DIAGNOSIS — I509 Heart failure, unspecified: Secondary | ICD-10-CM | POA: Diagnosis not present

## 2016-11-22 DIAGNOSIS — Z7901 Long term (current) use of anticoagulants: Secondary | ICD-10-CM

## 2016-11-22 DIAGNOSIS — E860 Dehydration: Secondary | ICD-10-CM | POA: Diagnosis not present

## 2016-11-22 DIAGNOSIS — I482 Chronic atrial fibrillation: Secondary | ICD-10-CM | POA: Diagnosis present

## 2016-11-22 DIAGNOSIS — R1110 Vomiting, unspecified: Secondary | ICD-10-CM | POA: Diagnosis present

## 2016-11-22 DIAGNOSIS — R112 Nausea with vomiting, unspecified: Secondary | ICD-10-CM | POA: Diagnosis not present

## 2016-11-22 LAB — COMPREHENSIVE METABOLIC PANEL
ALBUMIN: 3.7 g/dL (ref 3.5–5.0)
ALT: 48 U/L (ref 17–63)
AST: 60 U/L — ABNORMAL HIGH (ref 15–41)
Alkaline Phosphatase: 94 U/L (ref 38–126)
Anion gap: UNDETERMINED (ref 5–15)
BILIRUBIN TOTAL: 3.6 mg/dL — AB (ref 0.3–1.2)
BUN: 32 mg/dL — ABNORMAL HIGH (ref 6–20)
CO2: 38 mmol/L — ABNORMAL HIGH (ref 22–32)
CREATININE: 1.01 mg/dL (ref 0.61–1.24)
Calcium: 8.3 mg/dL — ABNORMAL LOW (ref 8.9–10.3)
Chloride: <65 mmol/L — CL (ref 101–111)
GFR calc Af Amer: >60 mL/min (ref >60)
GLUCOSE: 147 mg/dL — AB (ref 65–99)
Sodium: 111 mmol/L — CL (ref 135–145)
TOTAL PROTEIN: 7.8 g/dL (ref 6.5–8.1)

## 2016-11-22 LAB — URINALYSIS, COMPLETE (UACMP) WITH MICROSCOPIC
BACTERIA UA: NONE SEEN
BILIRUBIN URINE: NEGATIVE
Glucose, UA: NEGATIVE mg/dL
Hgb urine dipstick: NEGATIVE
KETONES UR: NEGATIVE mg/dL
LEUKOCYTES UA: NEGATIVE
NITRITE: NEGATIVE
Protein, ur: NEGATIVE mg/dL
RBC / HPF: NONE SEEN RBC/hpf (ref 0–5)
SPECIFIC GRAVITY, URINE: 1.008 (ref 1.005–1.030)
pH: 6 (ref 5.0–8.0)

## 2016-11-22 LAB — LIPASE, BLOOD: Lipase: 24 U/L (ref 11–51)

## 2016-11-22 LAB — CBC
HEMATOCRIT: 43.4 % (ref 40.0–52.0)
Hemoglobin: 15 g/dL (ref 13.0–18.0)
MCH: 27.5 pg (ref 26.0–34.0)
MCHC: 34.6 g/dL (ref 32.0–36.0)
MCV: 79.5 fL — AB (ref 80.0–100.0)
PLATELETS: 138 10*3/uL — AB (ref 150–440)
RBC: 5.46 MIL/uL (ref 4.40–5.90)
RDW: 16.2 % — ABNORMAL HIGH (ref 11.5–14.5)
WBC: 6.6 10*3/uL (ref 3.8–10.6)

## 2016-11-22 LAB — DIGOXIN LEVEL

## 2016-11-22 LAB — BASIC METABOLIC PANEL
ANION GAP: 12 (ref 5–15)
BUN: 26 mg/dL — ABNORMAL HIGH (ref 6–20)
CALCIUM: 8.3 mg/dL — AB (ref 8.9–10.3)
CO2: 39 mmol/L — ABNORMAL HIGH (ref 22–32)
Chloride: 66 mmol/L — ABNORMAL LOW (ref 101–111)
Creatinine, Ser: 0.91 mg/dL (ref 0.61–1.24)
GFR calc non Af Amer: >60 mL/min (ref >60)
Glucose, Bld: 110 mg/dL — ABNORMAL HIGH (ref 65–99)
Potassium: 2.1 mmol/L — CL (ref 3.5–5.1)
Sodium: 117 mmol/L — CL (ref 135–145)

## 2016-11-22 LAB — URINE DRUG SCREEN, QUALITATIVE (ARMC ONLY)
AMPHETAMINES, UR SCREEN: NOT DETECTED
BARBITURATES, UR SCREEN: NOT DETECTED
BENZODIAZEPINE, UR SCRN: NOT DETECTED
Cannabinoid 50 Ng, Ur ~~LOC~~: NOT DETECTED
Cocaine Metabolite,Ur ~~LOC~~: NOT DETECTED
MDMA (Ecstasy)Ur Screen: NOT DETECTED
METHADONE SCREEN, URINE: NOT DETECTED
Opiate, Ur Screen: NOT DETECTED
Phencyclidine (PCP) Ur S: NOT DETECTED
Tricyclic, Ur Screen: NOT DETECTED

## 2016-11-22 LAB — BRAIN NATRIURETIC PEPTIDE: B Natriuretic Peptide: 44 pg/mL (ref 0.0–100.0)

## 2016-11-22 LAB — LACTIC ACID, PLASMA: LACTIC ACID, VENOUS: 1.3 mmol/L (ref 0.5–1.9)

## 2016-11-22 LAB — TROPONIN I: Troponin I: 0.03 ng/mL (ref <0.03)

## 2016-11-22 LAB — GLUCOSE, CAPILLARY: GLUCOSE-CAPILLARY: 117 mg/dL — AB (ref 65–99)

## 2016-11-22 LAB — MRSA PCR SCREENING: MRSA BY PCR: NEGATIVE

## 2016-11-22 LAB — MAGNESIUM: MAGNESIUM: 2 mg/dL (ref 1.7–2.4)

## 2016-11-22 LAB — PHOSPHORUS: PHOSPHORUS: 3 mg/dL (ref 2.5–4.6)

## 2016-11-22 MED ORDER — POTASSIUM CHLORIDE CRYS ER 20 MEQ PO TBCR
40.0000 meq | EXTENDED_RELEASE_TABLET | ORAL | Status: AC
  Administered 2016-11-23: 40 meq via ORAL
  Filled 2016-11-22: qty 2

## 2016-11-22 MED ORDER — METOPROLOL SUCCINATE ER 50 MG PO TB24: 200.0000 mg | ORAL_TABLET | Freq: Every day | ORAL | Status: DC

## 2016-11-22 MED ORDER — POTASSIUM CHLORIDE IN NACL 40-0.9 MEQ/L-% IV SOLN
INTRAVENOUS | Status: DC
  Administered 2016-11-22: 75 mL/h via INTRAVENOUS
  Administered 2016-11-23: 100 mL/h via INTRAVENOUS
  Administered 2016-11-23 – 2016-11-25 (×3): 30 mL/h via INTRAVENOUS
  Filled 2016-11-22 (×9): qty 1000

## 2016-11-22 MED ORDER — SODIUM CHLORIDE 0.9 % IV SOLN
Freq: Once | INTRAVENOUS | Status: AC
  Administered 2016-11-22: 18:00:00 via INTRAVENOUS

## 2016-11-22 MED ORDER — VITAMIN B-1 100 MG PO TABS: 100.0000 mg | ORAL_TABLET | Freq: Every day | ORAL | Status: DC

## 2016-11-22 MED ORDER — SACUBITRIL-VALSARTAN 24-26 MG PO TABS
1.0000 | ORAL_TABLET | Freq: Two times a day (BID) | ORAL | Status: DC
  Administered 2016-11-23 – 2016-11-26 (×7): 1 via ORAL
  Filled 2016-11-22 (×7): qty 1

## 2016-11-22 MED ORDER — SENNOSIDES-DOCUSATE SODIUM 8.6-50 MG PO TABS
1.0000 | ORAL_TABLET | Freq: Every evening | ORAL | Status: DC | PRN
  Administered 2016-11-24: 09:00:00 1 via ORAL
  Filled 2016-11-22: qty 1

## 2016-11-22 MED ORDER — ONDANSETRON HCL 4 MG/2ML IJ SOLN
4.0000 mg | Freq: Once | INTRAMUSCULAR | Status: AC
  Administered 2016-11-22: 4 mg via INTRAVENOUS
  Filled 2016-11-22: qty 2

## 2016-11-22 MED ORDER — DIGOXIN 125 MCG PO TABS
0.1250 mg | ORAL_TABLET | Freq: Every day | ORAL | Status: DC
  Administered 2016-11-23 – 2016-11-26 (×4): 0.125 mg via ORAL
  Filled 2016-11-22 (×4): qty 1

## 2016-11-22 MED ORDER — POTASSIUM CHLORIDE 10 MEQ/100ML IV SOLN
10.0000 meq | Freq: Once | INTRAVENOUS | Status: AC
  Administered 2016-11-22: 10 meq via INTRAVENOUS
  Filled 2016-11-22: qty 100

## 2016-11-22 MED ORDER — ONDANSETRON HCL 4 MG PO TABS: 4.0000 mg | ORAL_TABLET | Freq: Four times a day (QID) | ORAL | Status: DC | PRN

## 2016-11-22 MED ORDER — SODIUM CHLORIDE 0.9 % IV SOLN
1.0000 mg | Freq: Once | INTRAVENOUS | Status: AC
  Administered 2016-11-22: 1 mg via INTRAVENOUS
  Filled 2016-11-22: qty 0.2

## 2016-11-22 MED ORDER — SODIUM CHLORIDE 0.9 % IV BOLUS (SEPSIS)
1000.0000 mL | Freq: Once | INTRAVENOUS | Status: AC
  Administered 2016-11-22: 1000 mL via INTRAVENOUS

## 2016-11-22 MED ORDER — THIAMINE HCL 100 MG/ML IJ SOLN
100.0000 mg | Freq: Once | INTRAMUSCULAR | Status: AC
  Administered 2016-11-22: 100 mg via INTRAVENOUS
  Filled 2016-11-22: qty 2

## 2016-11-22 MED ORDER — FOLIC ACID 1 MG PO TABS
1.0000 mg | ORAL_TABLET | Freq: Every day | ORAL | Status: DC
  Administered 2016-11-23 – 2016-11-26 (×4): 1 mg via ORAL
  Filled 2016-11-22 (×4): qty 1

## 2016-11-22 MED ORDER — FENTANYL CITRATE (PF) 100 MCG/2ML IJ SOLN
50.0000 ug | Freq: Once | INTRAMUSCULAR | Status: AC
  Administered 2016-11-22: 50 ug via INTRAVENOUS
  Filled 2016-11-22: qty 2

## 2016-11-22 MED ORDER — ONDANSETRON HCL 4 MG/2ML IJ SOLN: 4.0000 mg | Freq: Four times a day (QID) | INTRAMUSCULAR | Status: DC | PRN

## 2016-11-22 MED ORDER — ACETAMINOPHEN 650 MG RE SUPP: 650.0000 mg | Freq: Four times a day (QID) | RECTAL | Status: DC | PRN

## 2016-11-22 MED ORDER — APIXABAN 5 MG PO TABS
5.0000 mg | ORAL_TABLET | Freq: Two times a day (BID) | ORAL | Status: DC
  Administered 2016-11-22 – 2016-11-26 (×8): 5 mg via ORAL
  Filled 2016-11-22 (×8): qty 1

## 2016-11-22 MED ORDER — POTASSIUM CHLORIDE CRYS ER 20 MEQ PO TBCR
40.0000 meq | EXTENDED_RELEASE_TABLET | Freq: Once | ORAL | Status: AC
  Administered 2016-11-22: 40 meq via ORAL
  Filled 2016-11-22: qty 2

## 2016-11-22 MED ORDER — PROPYLTHIOURACIL 50 MG PO TABS
50.0000 mg | ORAL_TABLET | Freq: Three times a day (TID) | ORAL | Status: DC
  Administered 2016-11-23 – 2016-11-24 (×4): 50 mg via ORAL
  Filled 2016-11-22 (×10): qty 1

## 2016-11-22 MED ORDER — ACETAMINOPHEN 325 MG PO TABS
650.0000 mg | ORAL_TABLET | Freq: Four times a day (QID) | ORAL | Status: DC | PRN
  Administered 2016-11-23 – 2016-11-26 (×9): 650 mg via ORAL
  Filled 2016-11-22 (×9): qty 2

## 2016-11-22 MED ORDER — METHIMAZOLE 10 MG PO TABS
20.0000 mg | ORAL_TABLET | Freq: Two times a day (BID) | ORAL | Status: DC
  Administered 2016-11-23 – 2016-11-26 (×7): 20 mg via ORAL
  Filled 2016-11-22 (×7): qty 2

## 2016-11-22 NOTE — ED Notes (Signed)
2nd Lactic acid not needed per Dr. Alphonzo Lemmings.

## 2016-11-22 NOTE — H&P (Signed)
[facility] - [place] at [facility]   PATIENT NAME: Eugene Henderson    MR#:  [id number]  DATE OF BIRTH:  23-Jul-1950  DATE OF ADMISSION:  11/22/2016  PRIMARY CARE PHYSICIAN: Dorcas Carrow, DO   REQUESTING/REFERRING PHYSICIAN:   CHIEF COMPLAINT:   Chief Complaint  Patient presents with  . Chest Pain    HISTORY OF PRESENT ILLNESS: Edilson Vital  is a 66 y.o. male with a known history of Arthritis, bronchial asthma, congestive heart failure, hypertension, hyperthyroidism, atrial fibrillation on a lupus for anticoagulation presented to the emergency room for nausea and vomiting for 3 days. Patient also has some chest tightness and epigastric discomfort. The epigastric pain is sharp in nature 4 out of 10 patient last drink of alcohol was Sunday night. Patient was evaluated in the emergency room his sodium was low around 111 and potassium was also low. Magnesium was normal. Phosphorus was also normal. No change in mental status. No history of any seizure. Patient was started on IV fluids and potassium was supplemented. CT abdomen showed abdominal aortic aneurysm. Hospitalist service was consulted for further care of the patient.  PAST MEDICAL HISTORY:   Past Medical History:  Diagnosis Date  . Arthritis   . Asthma   . CHF (congestive heart failure) (HCC)   . Hypertension   . Hyperthyroidism   . Noncompliance   . Persistent atrial fibrillation (HCC)     PAST SURGICAL HISTORY: Past Surgical History:  Procedure Laterality Date  . JOINT REPLACEMENT    . RIGHT/LEFT HEART CATH AND CORONARY ANGIOGRAPHY N/A 08/02/2016   Procedure: Right/Left Heart Cath and Coronary Angiography;  Surgeon: Iran Ouch, MD;  Location: [facility] INVASIVE CV LAB;  Service: Cardiovascular;  Laterality: N/A;    SOCIAL HISTORY:  Social History  Substance Use Topics  . Smoking status: Current Some Day Smoker    Packs/day: 0.25    Years: 25.00    Types: Cigarettes  . Smokeless tobacco:  Never Used  . Alcohol use Yes     Comment: Socially - once a month    FAMILY HISTORY:  Family History  Problem Relation Age of Onset  . Diabetes Mother   . Diabetes Father   . Diabetes Brother     DRUG ALLERGIES: No Known Allergies  REVIEW OF SYSTEMS:   CONSTITUTIONAL: No fever, fatigue or weakness.  EYES: No blurred or double vision.  EARS, NOSE, AND THROAT: No tinnitus or ear pain.  RESPIRATORY: No cough, shortness of breath, wheezing or hemoptysis.  CARDIOVASCULAR: Has chest pain, no orthopnea, edema.  GASTROINTESTINAL: Has nausea, vomiting, epigastric pain. No diarrhoea  GENITOURINARY: No dysuria, hematuria.  ENDOCRINE: No polyuria, nocturia,  HEMATOLOGY: No anemia, easy bruising or bleeding SKIN: No rash or lesion. MUSCULOSKELETAL: No joint pain or arthritis.   NEUROLOGIC: No tingling, numbness, weakness.  PSYCHIATRY: No anxiety or depression.   MEDICATIONS AT HOME:  Prior to Admission medications   Medication Sig Start Date End Date Taking? Authorizing Provider  apixaban (ELIQUIS) 5 MG TABS tablet Take 1 tablet (5 mg total) by mouth 2 (two) times daily. 09/28/16  Yes Clarisa Kindred A, FNP  digoxin (LANOXIN) 0.125 MG tablet Take 1 tablet (0.125 mg total) by mouth daily. 09/28/16  Yes Clarisa Kindred A, FNP  furosemide (LASIX) 40 MG tablet Take 1 tablet (40 mg total) by mouth 2 (two) times daily. 09/28/16  Yes Hackney, Inetta Fermo A, FNP  losartan (COZAAR) 25 MG tablet Take 12.5 mg by mouth daily.  Yes [provider]  methimazole (TAPAZOLE) 10 MG tablet Take 2 tablets (20 mg total) by mouth 2 (two) times daily. 09/28/16  Yes Clarisa Kindred A, FNP  metoprolol (TOPROL-XL) 200 MG 24 hr tablet Take 1 tablet (200 mg total) by mouth daily. Take with or immediately following a meal. 09/28/16  Yes Hackney, Inetta Fermo A, FNP  naproxen sodium (ANAPROX) 220 MG tablet Take 220 mg by mouth 2 (two) times daily with a meal.   Yes [provider]  propylthiouracil (PTU) 50 MG tablet Take  50 mg by mouth 3 (three) times daily.   Yes [provider]  spironolactone (ALDACTONE) 25 MG tablet Take 0.5 tablets (12.5 mg total) by mouth daily. 09/28/16  Yes Hackney, Tina A, FNP  sacubitril-valsartan (ENTRESTO) 24-26 MG Take 1 tablet by mouth 2 (two) times daily. Patient not taking: Reported on 11/22/2016 09/28/16   Delma Freeze, FNP  traMADol (ULTRAM) 50 MG tablet Take 1 tablet (50 mg total) by mouth every 6 (six) hours as needed. Patient not taking: Reported on 11/02/2016 06/03/16 06/03/17  Charlynne Pander, MD      PHYSICAL EXAMINATION:   VITAL SIGNS: Blood pressure 107/79, pulse 73, temperature 97.8 F (36.6 C), temperature source Oral, resp. rate 14, height 6' (1.829 m), weight 121.1 kg (267 lb), SpO2 99 %.  GENERAL:  66 y.o.-year-old patient lying in the bed with no acute distress.  EYES: Pupils equal, round, reactive to light and accommodation. No scleral icterus. Extraocular muscles intact.  HEENT: Head atraumatic, normocephalic. Oropharynx and nasopharynx clear.  NECK:  Supple, no jugular venous distention. No thyroid enlargement, no tenderness.  LUNGS: Normal breath sounds bilaterally, no wheezing, rales,rhonchi or crepitation. No use of accessory muscles of respiration.  CARDIOVASCULAR: S1, S2 normal. No murmurs, rubs, or gallops.  ABDOMEN: Soft, tenderness in epigastrium, nondistended. Bowel sounds present. No organomegaly or mass.  EXTREMITIES: No pedal edema, cyanosis, or clubbing.  NEUROLOGIC: Cranial nerves II through XII are intact. Muscle strength 5/5 in all extremities. Sensation intact. Gait not checked.  PSYCHIATRIC: The patient is alert and oriented x 3.  SKIN: No obvious rash, lesion, or ulcer.   LABORATORY PANEL:   CBC  Recent Labs Lab 11/22/16 1344  WBC 6.6  HGB 15.0  HCT 43.4  PLT 138*  MCV 79.5*  MCH 27.5  MCHC 34.6  RDW 16.2*    ------------------------------------------------------------------------------------------------------------------  Chemistries   Recent Labs Lab 11/22/16 1344  NA 111*  K <2.0*  CL <65*  CO2 38*  GLUCOSE 147*  BUN 32*  CREATININE 1.01  CALCIUM 8.3*  MG 2.0  AST 60*  ALT 48  ALKPHOS 94  BILITOT 3.6*   ------------------------------------------------------------------------------------------------------------------ estimated creatinine clearance is 98 mL/min (by C-G formula based on SCr of 1.01 mg/dL). ------------------------------------------------------------------------------------------------------------------ No results for input(s): TSH, T4TOTAL, T3FREE, THYROIDAB in the last 72 hours.  Invalid input(s): FREET3   Coagulation profile No results for input(s): INR, PROTIME in the last 168 hours. ------------------------------------------------------------------------------------------------------------------- No results for input(s): DDIMER in the last 72 hours. -------------------------------------------------------------------------------------------------------------------  Cardiac Enzymes  Recent Labs Lab 11/22/16 1344  TROPONINI 0.03*   ------------------------------------------------------------------------------------------------------------------ Invalid input(s): POCBNP  ---------------------------------------------------------------------------------------------------------------  Urinalysis    Component Value Date/Time   COLORURINE YELLOW (A) 11/22/2016 1446   APPEARANCEUR CLEAR (A) 11/22/2016 1446   LABSPEC 1.008 11/22/2016 1446   PHURINE 6.0 11/22/2016 1446   GLUCOSEU NEGATIVE 11/22/2016 1446   HGBUR NEGATIVE 11/22/2016 1446   BILIRUBINUR NEGATIVE 11/22/2016 1446   KETONESUR NEGATIVE 11/22/2016 1446  PROTEINUR NEGATIVE 11/22/2016 1446   NITRITE NEGATIVE 11/22/2016 1446   LEUKOCYTESUR NEGATIVE 11/22/2016 1446     RADIOLOGY: Dg Chest  2 View  Result Date: 11/22/2016 CLINICAL DATA:  66 year old with acute onset of mid and right-sided chest pain earlier today associated with vomiting, shortness of breath and dizziness. EXAM: CHEST  2 VIEW COMPARISON:  07/28/2016, 06/03/2016, including CTA chest on both of these dates. FINDINGS: Cardiac silhouette markedly enlarged, unchanged. Thoracic aorta tortuous and atherosclerotic, unchanged. Hilar and mediastinal contours otherwise unremarkable. Lungs clear. Bronchovascular markings normal. Pulmonary vascularity normal. No visible pleural effusions. No pneumothorax. Degenerative changes involving the lower thoracic spine. IMPRESSION: Stable cardiomegaly.  No acute cardiopulmonary disease. Electronically Signed   By: Hulan Saas M.D.   On: 11/22/2016 14:30   Ct Renal Stone Study  Result Date: 11/22/2016 CLINICAL DATA:  Vomiting for 2 days with right-sided pain EXAM: CT ABDOMEN AND PELVIS WITHOUT CONTRAST TECHNIQUE: Multidetector CT imaging of the abdomen and pelvis was performed following the standard protocol without oral or intravenous contrast material administration. COMPARISON:  None. FINDINGS: Lower chest: There is mild right base atelectasis. Heart appears mildly enlarged. There is a small hiatal hernia. Hepatobiliary: No focal liver lesions are apparent on this noncontrast enhanced study. There is sludge in the gallbladder. Gallbladder wall does not appear appreciably thickened. There is no biliary duct dilatation. Pancreas: There is evidence of fatty replacement in portions of pancreas. No pancreatic mass or inflammatory focus. Spleen: No splenic lesions are evident. Adrenals/Urinary Tract: Adrenals appear unremarkable bilaterally. There is fetal lobulation in each kidney, an anatomic variant. There is no evident renal mass or hydronephrosis on either side. There is no renal or ureteral calculus on either side. The urinary bladder is midline with wall thickness within normal limits. Urinary  bladder is mildly distended. Stomach/Bowel: There is no appreciable bowel wall or mesenteric thickening. There is no appreciable bowel obstruction. No evident free air or portal venous air. Vascular/Lymphatic: There is prominence of the proximal abdominal aorta with a measured transverse diameter at the level of the celiac axis of 4.5 x 4.0 cm. The aorta tapers distally. The aorta measures 3.1 x 2.8 cm near the bifurcation. 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 There is aortic atherosclerosis throughout the aorta and common iliac arteries extending into the external and internal iliac arteries. Major mesenteric vessels appear patent on this noncontrast enhanced study. There is no adenopathy in the abdomen or pelvis. Reproductive: There are multiple prostatic calculi. Prostate and seminal vesicles appear normal in size and contour. No pelvic mass evident. Other: There is mesh along the anterior abdominal wall. In the area of the mesh, there is a fairly small ventral hernia containing only fat. The appendix appears normal. There is no ascites or abscess in the abdomen or pelvis. Musculoskeletal: There is multifocal degenerative change in the lumbar spine with vacuum phenomenon at L2-3 and L3-4. There is mid lumbar dextroscoliosis. 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 There is a questionable degree of avascular necrosis in the lateral left femoral head. No blastic or lytic bone lesions are evident. There is no intramuscular or abdominal wall lesion. IMPRESSION: No bowel wall thickening or bowel obstruction.  No free air. Dilatation of the proximal abdominal aorta with a maximum diameter 4.5 x 4.0 cm. Aorta  tapers distally, measuring 3.1 x 2.8 cm near the bifurcation. 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 Recommend followup by abdomen and pelvis CTA in 6 months, and vascular surgery referral/consultation if not already obtained. This recommendation follows ACR consensus guidelines: White Paper of the ACR Incidental Findings Committee II on Vascular Findings. J Am Coll Radiol 2013; 10:789-794. Multiple prostatic calculi. No renal or ureteral calculi. No hydronephrosis. Appendix appears normal.  No abscess. Small hiatal hernia. Ventral hernia containing only fat. Evidence of previous hernia repair along the anterior abdominal wall. Fairly advanced arthropathy in the left hip joint with multiple cystic areas on both sides of the joint. Question degree of underlying avascular necrosis in the lateral femoral head on the left. Electronically Signed   By: Bretta Bang III M.D.   On: 11/22/2016 17:48    EKG: Orders placed or performed during the hospital encounter of 11/22/16  . EKG 12-Lead  . EKG 12-Lead  . ED EKG within 10 minutes  . ED EKG within 10 minutes    IMPRESSION AND PLAN: 66 year old male patient with history of hypertension, hyperthyroidism, congestive heart failure, atrial fibrillation who is on oral diuretics as outpatient presented to the emergency room with chest discomfort, epigastric pain, nausea and vomiting. Admitting diagnosis 1. Hyponatremia 2. Hypokalemia 3. Atypical chest pain 4. Dehydration 5. Congestive heart failure 6. Abdominal aortic aneurysm Treatment plan Admit patient to stepdown unit IV fluid hydration Replace potassium Cycle troponin to rule out ischemia Follow-up sodium level If the sodium is persistently low will start patient on hypertonic saline Resume eliquis for anticoagulation Supportive care  All the records are reviewed and case discussed with ED provider. Management plans discussed with the patient, family and they are in agreement.  CODE STATUS:FULL  CODE Code Status History    Date Active Date Inactive Code Status Order ID Comments User Context   07/28/2016  1:55 PM 08/04/2016  9:51 PM Full Code [id number]  Houston Siren, MD ED    Advance Directive Documentation     Most Recent Value  Type of Advance Directive  Healthcare Power of Attorney  Pre-existing out of facility DNR order (yellow form or pink MOST form)  -  "MOST" Form in Place?  -       TOTAL TIME TAKING CARE OF THIS PATIENT: 51 minutes.    Ihor Austin M.D on 11/22/2016 at 7:10 PM  Between 7am to 6pm - Pager - [phone]  After 6pm go to [email or web address] - password [email or web address] [facility]  [facility]  Office  [phone]  CC: Primary care physician; Dorcas Carrow, DO

## 2016-11-22 NOTE — ED Notes (Signed)
Dr. Alphonzo Lemmings aware of critical labs of Chloride <65, Sodium of 111, and Potassium <2.

## 2016-11-22 NOTE — Consult Note (Signed)
Name: Eugene Henderson MRN: [id number] DOB: 1951-06-04    ADMISSION DATE:  11/22/2016 CONSULTATION DATE: 11/22/2016  REFERRING MD :  Dr. Tobi Bastos  CHIEF COMPLAINT: Chest Pain   BRIEF PATIENT DESCRIPTION:  66 yo male admitted 06/18 with vomiting, right sided chest and abdominal pain, severe hyponatremia, hypokalemia, and hypochloremia  SIGNIFICANT EVENTS  06/18-Pt admitted to the Stepdown Unit   STUDIES:  CT Renal Stone Study 06/18>>No bowel wall thickening or bowel obstruction.  No free air. Dilatation of the proximal abdominal aorta with a maximum diameter 4.5 x 4.0 cm. Aorta tapers distally, measuring 3.1 x 2.8 cm near the bifurcation. 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 Recommend followup by abdomen and pelvis CTA in 6 months, and vascular surgery referral/consultation if not already obtained. This recommendation follows ACR consensus guidelines: White Paper of the ACR Incidental Findings Committee II on Vascular Findings. J Am Coll Radiol 2013; 10:789-794. Multiple prostatic calculi. No renal or ureteral calculi. No Hydronephrosis. Appendix appears normal.  No abscess. Small hiatal hernia. Ventral hernia containing only fat. Evidence of previous hernia repair along the anterior abdominal wall. Fairly advanced arthropathy in the left hip joint with multiple cystic areas on both sides of the joint. Question degree of underlying avascular necrosis in the lateral femoral head on the left.  HISTORY OF PRESENT ILLNESS:   This is a 66 yo male with a PMH of Atrial fibrillation, Medical Noncompliance, ETOH Abuse, Hyperthyroidism, HTN, CHF, Asthma, and Arthritis. He presented to [facility] ER 06/18 with c/o right sided chest and abdominal pain, vomiting, weakness, and generalized cramping.  He states his last drink was 06/18 he drank one 40 ounce beer, he states he use to drink 1/2 pack of beer daily, but has decreased  his intake 2 weeks ago. Lab results revealed Na+ 111, K+ <2.0, chloride <65, CO2 38, AST 60, troponin 0.03, and digoxin level <0.2.  Therefore, he was admitted to the [facility] Unit by hospitalist team for further workup and management PCCM consulted.    PAST MEDICAL HISTORY :   has a past medical history of Arthritis; Asthma; CHF (congestive heart failure) (HCC); Hypertension; Hyperthyroidism; Noncompliance; and Persistent atrial fibrillation (HCC).  has a past surgical history that includes Right/Left Heart Cath and Coronary Angiography (N/A, 08/02/2016) and Joint replacement. Prior to Admission medications   Medication Sig Start Date End Date Taking? Authorizing Provider  apixaban (ELIQUIS) 5 MG TABS tablet Take 1 tablet (5 mg total) by mouth 2 (two) times daily. 09/28/16  Yes Clarisa Kindred A, FNP  digoxin (LANOXIN) 0.125 MG tablet Take 1 tablet (0.125 mg total) by mouth daily. 09/28/16  Yes Clarisa Kindred A, FNP  furosemide (LASIX) 40 MG tablet Take 1 tablet (40 mg total) by mouth 2 (two) times daily. 09/28/16  Yes Hackney, Inetta Fermo A, FNP  losartan (COZAAR) 25 MG tablet Take 12.5 mg by mouth daily.   Yes [provider]  methimazole (TAPAZOLE) 10 MG tablet Take 2 tablets (20 mg total) by mouth 2 (two) times daily. 09/28/16  Yes Clarisa Kindred A, FNP  metoprolol (TOPROL-XL) 200 MG 24 hr tablet Take 1 tablet (200 mg total) by mouth daily. Take with or immediately following a meal. 09/28/16  Yes Hackney, Tina A, FNP  naproxen sodium (ANAPROX) 220 MG tablet Take 220 mg by mouth 2 (two) times daily with a meal.   Yes [provider]  propylthiouracil (PTU) 50 MG tablet Take  50 mg by mouth 3 (three) times daily.   Yes [provider]  spironolactone (ALDACTONE) 25 MG tablet Take 0.5 tablets (12.5 mg total) by mouth daily. 09/28/16  Yes Hackney, Tina A, FNP  sacubitril-valsartan (ENTRESTO) 24-26 MG Take 1 tablet by mouth 2 (two) times daily. Patient not taking: Reported on 11/22/2016  09/28/16   Delma Freeze, FNP  traMADol (ULTRAM) 50 MG tablet Take 1 tablet (50 mg total) by mouth every 6 (six) hours as needed. Patient not taking: Reported on 11/02/2016 06/03/16 06/03/17  Charlynne Pander, MD   No Known Allergies  FAMILY HISTORY:  family history includes Diabetes in his brother, father, and mother. SOCIAL HISTORY:  reports that he has been smoking Cigarettes.  He has a 6.25 pack-year smoking history. He has never used smokeless tobacco. He reports that he drinks alcohol. He reports that he does not use drugs.  REVIEW OF SYSTEMS: Positives in BOLD  Constitutional: Negative for fever, chills, weight loss, malaise/fatigue and diaphoresis.  HENT: Negative for hearing loss, ear pain, nosebleeds, congestion, sore throat, neck pain, tinnitus and ear discharge.   Eyes: Negative for blurred vision, double vision, photophobia, pain, discharge and redness.  Respiratory: Negative for cough, hemoptysis, sputum production, shortness of breath, wheezing and stridor.   Cardiovascular: chest pain, palpitations, orthopnea, claudication, leg swelling and PND.  Gastrointestinal: heartburn, nausea, vomiting, abdominal pain, diarrhea, constipation, blood in stool and melena.  Genitourinary: Negative for dysuria, urgency, frequency, hematuria and flank pain.  Musculoskeletal: Negative for myalgias, back pain, joint pain and falls.  Skin: Negative for itching and rash.  Neurological: dizziness, tingling, tremors, sensory change, speech change, focal weakness, seizures, loss of consciousness, weakness and headaches.  Endo/Heme/Allergies: Negative for environmental allergies and polydipsia. Does not bruise/bleed easily.  SUBJECTIVE:  Pt states he is hungry no other complaints at this time.  VITAL SIGNS: Temp:  [97.8 F (36.6 C)] 97.8 F (36.6 C) (06/18 2030) Pulse Rate:  [49-87] 78 (06/18 2057) Resp:  [12-16] 14 (06/18 2057) BP: (97-109)/(64-79) 105/78 (06/18 2057) SpO2:  [94 %-100 %]  98 % (06/18 2057) Weight:  [121.1 kg (267 lb)-122.1 kg (269 lb 2.9 oz)] 122.1 kg (269 lb 2.9 oz) (06/18 2030)  PHYSICAL EXAMINATION: General: well developed, well nourished, Caucasian male NAD  Neuro: alert and oriented, follows commands, PERRLA HEENT: supple, no JVD Cardiovascular: irregular irregular, no M/R/G Lungs: clear throughout, even, non labored  Abdomen: +BS x4, obese, soft, non tender, non distended  Musculoskeletal: normal bulk and tone, no edema Skin: intact no rashes or lesions    Recent Labs Lab 11/22/16 1344 11/22/16 2205  NA 111* 117*  K <2.0* 2.1*  CL <65* 66*  CO2 38* 39*  BUN 32* 26*  CREATININE 1.01 0.91  GLUCOSE 147* 110*    Recent Labs Lab 11/22/16 1344  HGB 15.0  HCT 43.4  WBC 6.6  PLT 138*   Dg Chest 2 View  Result Date: 11/22/2016 CLINICAL DATA:  66 year old with acute onset of mid and right-sided chest pain earlier today associated with vomiting, shortness of breath and dizziness. EXAM: CHEST  2 VIEW COMPARISON:  07/28/2016, 06/03/2016, including CTA chest on both of these dates. FINDINGS: Cardiac silhouette markedly enlarged, unchanged. Thoracic aorta tortuous and atherosclerotic, unchanged. Hilar and mediastinal contours otherwise unremarkable. Lungs clear. Bronchovascular markings normal. Pulmonary vascularity normal. No visible pleural effusions. No pneumothorax. Degenerative changes involving the lower thoracic spine. IMPRESSION: Stable cardiomegaly.  No acute cardiopulmonary disease. Electronically Signed   By: Hulan Saas  M.D.   On: 11/22/2016 14:30   Ct Renal Stone Study  Result Date: 11/22/2016 CLINICAL DATA:  Vomiting for 2 days with right-sided pain EXAM: CT ABDOMEN AND PELVIS WITHOUT CONTRAST TECHNIQUE: Multidetector CT imaging of the abdomen and pelvis was performed following the standard protocol without oral or intravenous contrast material administration. COMPARISON:  None. FINDINGS: Lower chest: There is mild right base  atelectasis. Heart appears mildly enlarged. There is a small hiatal hernia. Hepatobiliary: No focal liver lesions are apparent on this noncontrast enhanced study. There is sludge in the gallbladder. Gallbladder wall does not appear appreciably thickened. There is no biliary duct dilatation. Pancreas: There is evidence of fatty replacement in portions of pancreas. No pancreatic mass or inflammatory focus. Spleen: No splenic lesions are evident. Adrenals/Urinary Tract: Adrenals appear unremarkable bilaterally. There is fetal lobulation in each kidney, an anatomic variant. There is no evident renal mass or hydronephrosis on either side. There is no renal or ureteral calculus on either side. The urinary bladder is midline with wall thickness within normal limits. Urinary bladder is mildly distended. Stomach/Bowel: There is no appreciable bowel wall or mesenteric thickening. There is no appreciable bowel obstruction. No evident free air or portal venous air. Vascular/Lymphatic: There is prominence of the proximal abdominal aorta with a measured transverse diameter at the level of the celiac axis of 4.5 x 4.0 cm. The aorta tapers distally. The aorta measures 3.1 x 2.8 cm near the bifurcation. 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 There is aortic atherosclerosis throughout the aorta and common iliac arteries extending into the external and internal iliac arteries. Major mesenteric vessels appear patent on this noncontrast enhanced study. There is no adenopathy in the abdomen or pelvis. Reproductive: There are multiple prostatic calculi. Prostate and seminal vesicles appear normal in size and contour. No pelvic mass evident. Other: There is mesh along the anterior abdominal wall. In the area of the mesh, there is a fairly small ventral hernia containing only fat. The appendix appears normal. There is no ascites or abscess in the  abdomen or pelvis. Musculoskeletal: There is multifocal degenerative change in the lumbar spine with vacuum phenomenon at L2-3 and L3-4. There is mid lumbar dextroscoliosis. 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 There is a questionable degree of avascular necrosis in the lateral left femoral head. No blastic or lytic bone lesions are evident. There is no intramuscular or abdominal wall lesion. IMPRESSION: No bowel wall thickening or bowel obstruction.  No free air. Dilatation of the proximal abdominal aorta with a maximum diameter 4.5 x 4.0 cm. Aorta tapers distally, measuring 3.1 x 2.8 cm near the bifurcation. 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 Recommend followup by abdomen and pelvis CTA in 6 months, and vascular surgery referral/consultation if not already obtained. This recommendation follows ACR consensus guidelines: White Paper of the ACR Incidental Findings Committee II on Vascular Findings. J Am Coll Radiol 2013; 10:789-794. Multiple prostatic calculi. No renal or ureteral calculi. No hydronephrosis. Appendix appears normal.  No abscess. Small hiatal hernia. Ventral hernia containing only fat. Evidence of previous hernia repair along the anterior abdominal wall. Fairly advanced arthropathy in the left hip joint with multiple cystic areas on both sides of the  joint. Question degree of underlying avascular necrosis in the lateral femoral head on the left. Electronically Signed   By: Bretta Bang III M.D.   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 Renal Stone Study 11/22/16 Hx: CHF, Chronic Afibb, Asthma, ETOH abuse, Hyperthyroidism, and HTN   P: Continue NS with KCL 40 meq @100  ml/hr  For hyponatremia will raise the serum sodium concentration by 4-6 mEq/L in 24hr period until normalized; if unable to achieve therapy goal will start hypertonic saline and consult nephrology  Serial BMP's Stat ethanol pending  Vascular consulted appreciate input  Serum Osmolality, Urine Osmolality, and Sodium, Urine results pending Trend BMP Replace electrolytes as indicated  Monitor uop  Continuous telemetry monitoring Trend troponin's  Trend CBC Monitor for s/sx of bleeding Eliquis for VTE prophylaxis  Seizure precautions  CIWA protocol  Will start thiamine, folic acid, and multivitamin   Sonda Rumble, AGNP  Pulmonary/Critical Care Pager [phone] (please enter 7 digits) PCCM Consult Pager [phone] (please enter 7 digits)   Billy Fischer, MD PCCM service Mobile [phone] Pager [phone] 11/23/2016 2:17 PM

## 2016-11-22 NOTE — ED Triage Notes (Addendum)
Per EMS report, patient c/o mid chest pain and right side chest pain for approximately 30 min prior to calling EMS. Patient states he vomited x2 this morning, but denies diarrhea. Patient c/o shortness of breath, dizziness and nausea. EMS report patient had b/p of 98/60 while sitting, but became near-syncopal when standing. Patient states he has had involuntary movements, "jerking" since Saturday night. Patient received of NS enroute by EMT.

## 2016-11-22 NOTE — ED Notes (Signed)
Two unsuccessful IV attempts by [employer].

## 2016-11-22 NOTE — ED Notes (Signed)
Dr. Alphonzo Lemmings informed of Troponin of 0.03.

## 2016-11-22 NOTE — ED Provider Notes (Addendum)
[facility] Emergency Department Provider Note  ____________________________________________   I have reviewed the triage vital signs and the nursing notes.   HISTORY  Chief Complaint Chest Pain    HPI Eugene Henderson is a 66 y.o. male  with a history of arthritis asthma CHF hypertension and hyperthyroid medical noncompliance atrial fibrillation and alcohol abuse presents today with vomiting. Patient states is been vomiting since Saturday. Feels weak and crampy all over. He states he has some right-sided abdominal pain and right-sided chest pain which was worse when he vomited. He cannot recall the last bowel movement but does not think is benign last few days and denies any rectal bleeding. Denies any melena or bright red blood per rectum. Denies any hematemesis. Denies fever or chills. At this time his chief complaint is he feels crampy and nauseated. No other associated symptoms. To me, he denies chest pain at this time. He did have some however while he was vomiting. He denies any prior abdominal surgery. And he cannot elucidate any further history on the severity is somewhat of a vague historian      Past Medical History:  Diagnosis Date  . Arthritis   . Asthma   . CHF (congestive heart failure) (HCC)   . Hypertension   . Hyperthyroidism   . Noncompliance   . Persistent atrial fibrillation [facility])     Patient Active Problem List   Diagnosis Date Noted  . Chronic systolic heart failure (HCC) 08/19/2016  . Tobacco use 08/19/2016  . Snoring 08/19/2016  . Persistent atrial fibrillation (HCC) 07/28/2016  . Elevated troponin 07/28/2016  . Hyperthyroidism 07/28/2016  . Noncompliance with medications 07/28/2016  . Essential hypertension 07/28/2016    Past Surgical History:  Procedure Laterality Date  . JOINT REPLACEMENT    . RIGHT/LEFT HEART CATH AND CORONARY ANGIOGRAPHY N/A 08/02/2016   Procedure: Right/Left Heart Cath and Coronary Angiography;   Surgeon: Iran Ouch, MD;  Location: [facility] INVASIVE CV LAB;  Service: Cardiovascular;  Laterality: N/A;    Prior to Admission medications   Medication Sig Start Date End Date Taking? Authorizing Provider  apixaban (ELIQUIS) 5 MG TABS tablet Take 1 tablet (5 mg total) by mouth 2 (two) times daily. 09/28/16   Delma Freeze, FNP  digoxin (LANOXIN) 0.125 MG tablet Take 1 tablet (0.125 mg total) by mouth daily. 09/28/16   Delma Freeze, FNP  furosemide (LASIX) 40 MG tablet Take 1 tablet (40 mg total) by mouth 2 (two) times daily. 09/28/16   Delma Freeze, FNP  methimazole (TAPAZOLE) 10 MG tablet Take 2 tablets (20 mg total) by mouth 2 (two) times daily. 09/28/16   Delma Freeze, FNP  metoprolol (TOPROL-XL) 200 MG 24 hr tablet Take 1 tablet (200 mg total) by mouth daily. Take with or immediately following a meal. 09/28/16   Delma Freeze, FNP  naproxen sodium (ANAPROX) 220 MG tablet Take 220 mg by mouth 2 (two) times daily with a meal.    [provider]  propylthiouracil (PTU) 50 MG tablet Take 50 mg by mouth 3 (three) times daily.    [provider]  sacubitril-valsartan (ENTRESTO) 24-26 MG Take 1 tablet by mouth 2 (two) times daily. 09/28/16   Delma Freeze, FNP  spironolactone (ALDACTONE) 25 MG tablet Take 0.5 tablets (12.5 mg total) by mouth daily. 09/28/16   Delma Freeze, FNP  traMADol (ULTRAM) 50 MG tablet Take 1 tablet (50 mg total) by mouth every 6 (six) hours as needed.  Patient not taking: Reported on 11/02/2016 06/03/16 06/03/17  Charlynne Pander, MD    Allergies Patient has no known allergies.  Family History  Problem Relation Age of Onset  . Diabetes Mother   . Diabetes Father   . Diabetes Brother     Social History Social History  Substance Use Topics  . Smoking status: Current Some Day Smoker    Packs/day: 0.25    Years: 25.00    Types: Cigarettes  . Smokeless tobacco: Never Used  . Alcohol use Yes     Comment: Socially - once a month     Review of Systems Constitutional: No fever/chills Eyes: No visual changes. ENT: No sore throat. No stiff neck no neck pain Cardiovascular: Denies chest pain. Respiratory: Denies shortness of breath. Gastrointestinal:   Positive vomiting.  No diarrhea.  No constipation. Genitourinary: Negative for dysuria. Musculoskeletal: Negative lower extremity swelling Skin: Negative for rash. Neurological: Negative for severe headaches, focal weakness or numbness.   ____________________________________________   PHYSICAL EXAM:  VITAL SIGNS: ED Triage Vitals  Enc Vitals Group     BP 11/22/16 1309 109/76     Pulse Rate 11/22/16 1309 87     Resp 11/22/16 1309 16     Temp 11/22/16 1309 97.8 F (36.6 C)     Temp Source 11/22/16 1309 Oral     SpO2 11/22/16 1309 100 %     Weight 11/22/16 1311 267 lb (121.1 kg)     Height 11/22/16 1311 6' (1.829 m)     Head Circumference --      Peak Flow --      Pain Score 11/22/16 1309 8     Pain Loc --      Pain Edu? --      Excl. in GC? --     Constitutional: Alert and oriented. Well appearing and in no acute distress. Eyes: Conjunctivae are normal Head: Atraumatic HEENT: No congestion/rhinnorhea. Mucous membranes are moist.  Oropharynx non-erythematous Neck:   Nontender with no meningismus, no masses, no stridor Cardiovascular: Normal rate, regular rhythm. Grossly normal heart sounds.  Good peripheral circulation. Respiratory: Normal respiratory effort.  No retractions. Lungs CTAB. Abdominal: Soft and Minimal tender diffusely. No distention. No guarding no rebound Back:  There is no focal tenderness or step off.  there is no midline tenderness there are no lesions noted. there is no CVA tenderness Musculoskeletal: No lower extremity tenderness, no upper extremity tenderness. No joint effusions, no DVT signs strong distal pulses no edema Neurologic:  Normal speech and language. No gross focal neurologic deficits are appreciated.  Skin:  Skin is  warm, dry and intact. No rash noted. Psychiatric: Mood and affect are normal. Speech and behavior are normal.  ____________________________________________   LABS (all labs ordered are listed, but only abnormal results are displayed)  Labs Reviewed  CBC - Abnormal; Notable for the following:       Result Value   MCV 79.5 (*)    RDW 16.2 (*)    Platelets 138 (*)    All other components within normal limits  TROPONIN I - Abnormal; Notable for the following:    Troponin I 0.03 (*)    All other components within normal limits  URINALYSIS, COMPLETE (UACMP) WITH MICROSCOPIC - Abnormal; Notable for the following:    Color, Urine YELLOW (*)    APPearance CLEAR (*)    Squamous Epithelial / LPF 0-5 (*)    All other components within normal limits  DIGOXIN LEVEL -  Abnormal; Notable for the following:    Digoxin Level <0.2 (*)    All other components within normal limits  MAGNESIUM  LIPASE, BLOOD  URINE DRUG SCREEN, QUALITATIVE ([facility] ONLY)  BRAIN NATRIURETIC PEPTIDE  LACTIC ACID, PLASMA  COMPREHENSIVE METABOLIC PANEL  LACTIC ACID, PLASMA   ____________________________________________  EKG  I personally interpreted any EKGs ordered by me or triage Atrial fibrillation rate 72 bpm, LBBB noted. ____________________________________________  RADIOLOGY  I reviewed any imaging ordered by me or triage that were performed during my shift and, if possible, patient and/or family made aware of any abnormal findings. ____________________________________________   PROCEDURES  Procedure(s) performed: None  Procedures  Critical Care performed: CRITICAL CARE Performed by: Jeanmarie Plant   Total critical care time: 65 minutes  Critical care time was exclusive of separately billable procedures and treating other patients.  Critical care was necessary to treat or prevent imminent or life-threatening deterioration.  Critical care was time spent personally by me on the following  activities: development of treatment plan with patient and/or surrogate as well as nursing, discussions with consultants, evaluation of patient's response to treatment, examination of patient, obtaining history from patient or surrogate, ordering and performing treatments and interventions, ordering and review of laboratory studies, ordering and review of radiographic studies, pulse oximetry and re-evaluation of patient's condition.   ____________________________________________   INITIAL IMPRESSION / ASSESSMENT AND PLAN / ED COURSE  Pertinent labs & imaging results that were available during my care of the patient were reviewed by me and considered in my medical decision making (see chart for details).    ----------------------------------------- 4:22 PM on 11/22/2016 -----------------------------------------  Pt in nad has no pain or tenderness awaiting CMP ----------------------------------------- 5:24 PM on 11/22/2016 -----------------------------------------  Patient with numerous significant abnormalities noted in CMP, he has hyponatremia, hypokalemia. We are giving him potassium and IV fluid. His mentation is clear we will avoid hypertonic saline. Patient does not have a surgical abdomen but we will obtain a noncontrasted CT scan to further evaluate. Patient will need to be admitted to the hospital. Verlon Au giving him thiamine and folate for history of alcohol abuse. We are trying to give him ginger IV fluid. Patient's vital signs are reassuring blood pressure somewhat softened up without come up with fluids. He does not appear to be toxic. Chest pain is not ongoing, initial troponin is reassuring, he will require further evaluation from that point of view. Lactic acid is negative. We will admit the patient to the hospitalist.  ----------------------------------------- 6:12 PM on 11/22/2016 -----------------------------------------  Patient admitted to the hospitalist service. I do  not believe AAA is causing patient's symptoms.     ____________________________________________   FINAL CLINICAL IMPRESSION(S) / ED DIAGNOSES  Final diagnoses:  None      This chart was dictated using voice recognition software.  Despite best efforts to proofread,  errors can occur which can change meaning.      Jeanmarie Plant, MD 11/22/16 1726    Jeanmarie Plant, MD 11/22/16 Elfredia Nevins    Jeanmarie Plant, MD 11/22/16 [phone]

## 2016-11-23 ENCOUNTER — Inpatient Hospital Stay: Payer: Medicare (Managed Care)

## 2016-11-23 DIAGNOSIS — R112 Nausea with vomiting, unspecified: Secondary | ICD-10-CM

## 2016-11-23 DIAGNOSIS — I509 Heart failure, unspecified: Secondary | ICD-10-CM

## 2016-11-23 DIAGNOSIS — I714 Abdominal aortic aneurysm, without rupture: Secondary | ICD-10-CM

## 2016-11-23 LAB — BASIC METABOLIC PANEL
ANION GAP: 11 (ref 5–15)
Anion gap: 11 (ref 5–15)
Anion gap: 6 (ref 5–15)
BUN: 24 mg/dL — ABNORMAL HIGH (ref 6–20)
BUN: 20 mg/dL (ref 6–20)
BUN: 18 mg/dL (ref 6–20)
CALCIUM: 8.5 mg/dL — AB (ref 8.9–10.3)
CALCIUM: 8.1 mg/dL — AB (ref 8.9–10.3)
CHLORIDE: 81 mmol/L — AB (ref 101–111)
CO2: 40 mmol/L — ABNORMAL HIGH (ref 22–32)
CO2: 35 mmol/L — ABNORMAL HIGH (ref 22–32)
CO2: 36 mmol/L — ABNORMAL HIGH (ref 22–32)
Calcium: 8.5 mg/dL — ABNORMAL LOW (ref 8.9–10.3)
Chloride: 66 mmol/L — ABNORMAL LOW (ref 101–111)
Chloride: 71 mmol/L — ABNORMAL LOW (ref 101–111)
Creatinine, Ser: 0.96 mg/dL (ref 0.61–1.24)
Creatinine, Ser: 0.89 mg/dL (ref 0.61–1.24)
Creatinine, Ser: 0.82 mg/dL (ref 0.61–1.24)
GFR calc Af Amer: >60 mL/min (ref >60)
GFR calc non Af Amer: >60 mL/min (ref >60)
GLUCOSE: 122 mg/dL — AB (ref 65–99)
Glucose, Bld: 151 mg/dL — ABNORMAL HIGH (ref 65–99)
Glucose, Bld: 131 mg/dL — ABNORMAL HIGH (ref 65–99)
POTASSIUM: 3 mmol/L — AB (ref 3.5–5.1)
Potassium: 2 mmol/L — CL (ref 3.5–5.1)
Potassium: 3.2 mmol/L — ABNORMAL LOW (ref 3.5–5.1)
Sodium: 117 mmol/L — CL (ref 135–145)
Sodium: 117 mmol/L — CL (ref 135–145)
Sodium: 123 mmol/L — ABNORMAL LOW (ref 135–145)

## 2016-11-23 LAB — CBC
HCT: 44.6 % (ref 40.0–52.0)
HEMOGLOBIN: 15.6 g/dL (ref 13.0–18.0)
MCH: 28.2 pg (ref 26.0–34.0)
MCHC: 34.9 g/dL (ref 32.0–36.0)
MCV: 80.6 fL (ref 80.0–100.0)
Platelets: 127 10*3/uL — ABNORMAL LOW (ref 150–440)
RBC: 5.53 MIL/uL (ref 4.40–5.90)
RDW: 15.7 % — ABNORMAL HIGH (ref 11.5–14.5)
WBC: 5.5 10*3/uL (ref 3.8–10.6)

## 2016-11-23 LAB — SODIUM
SODIUM: 120 mmol/L — AB (ref 135–145)
Sodium: 121 mmol/L — ABNORMAL LOW (ref 135–145)

## 2016-11-23 LAB — BLOOD GAS, ARTERIAL
ALLENS TEST (PASS/FAIL): POSITIVE — AB
Acid-Base Excess: 12.6 mmol/L — ABNORMAL HIGH (ref 0.0–2.0)
Bicarbonate: 38.1 mmol/L — ABNORMAL HIGH (ref 20.0–28.0)
FIO2: 0.28
O2 SAT: 97.6 %
PATIENT TEMPERATURE: 37
pCO2 arterial: 50 mmHg — ABNORMAL HIGH (ref 32.0–48.0)
pH, Arterial: 7.49 — ABNORMAL HIGH (ref 7.350–7.450)
pO2, Arterial: 91 mmHg (ref 83.0–108.0)

## 2016-11-23 LAB — AMYLASE: AMYLASE: 43 U/L (ref 28–100)

## 2016-11-23 LAB — SODIUM, URINE, RANDOM
Sodium, Ur: 54 mmol/L
Sodium, Ur: 29 mmol/L

## 2016-11-23 LAB — OSMOLALITY: OSMOLALITY: 249 mosm/kg — AB (ref 275–295)

## 2016-11-23 LAB — MAGNESIUM: Magnesium: 2.2 mg/dL (ref 1.7–2.4)

## 2016-11-23 LAB — OSMOLALITY, URINE: Osmolality, Ur: 324 mOsm/kg (ref 300–900)

## 2016-11-23 LAB — ETHANOL

## 2016-11-23 MED ORDER — POTASSIUM CHLORIDE 10 MEQ/100ML IV SOLN
10.0000 meq | INTRAVENOUS | Status: DC
  Administered 2016-11-23: 10 meq via INTRAVENOUS
  Filled 2016-11-23 (×6): qty 100

## 2016-11-23 MED ORDER — LIDOCAINE HCL 2 % IJ SOLN
5.0000 mL | Freq: Once | INTRAMUSCULAR | Status: DC
  Administered 2016-11-23: 100 mg via INTRADERMAL
  Filled 2016-11-23: qty 10

## 2016-11-23 MED ORDER — SODIUM CHLORIDE 0.9% FLUSH: 10.0000 mL | INTRAVENOUS | Status: DC | PRN

## 2016-11-23 MED ORDER — VITAMIN B-1 100 MG PO TABS
100.0000 mg | ORAL_TABLET | Freq: Every day | ORAL | Status: DC
  Administered 2016-11-24 – 2016-11-26 (×3): 100 mg via ORAL
  Filled 2016-11-23 (×3): qty 1

## 2016-11-23 MED ORDER — METOPROLOL SUCCINATE ER 50 MG PO TB24
100.0000 mg | ORAL_TABLET | Freq: Every day | ORAL | Status: DC
  Administered 2016-11-24 – 2016-11-26 (×3): 100 mg via ORAL
  Filled 2016-11-23 (×3): qty 2

## 2016-11-23 MED ORDER — SODIUM CHLORIDE 0.9% FLUSH
10.0000 mL | Freq: Two times a day (BID) | INTRAVENOUS | Status: DC
  Administered 2016-11-24 (×2): 10 mL
  Administered 2016-11-24: 20 mL
  Administered 2016-11-25 – 2016-11-26 (×3): 10 mL

## 2016-11-23 MED ORDER — OCUVITE-LUTEIN PO CAPS
1.0000 | ORAL_CAPSULE | Freq: Every day | ORAL | Status: DC
  Administered 2016-11-23 – 2016-11-26 (×4): 1 via ORAL
  Filled 2016-11-23 (×4): qty 1

## 2016-11-23 MED ORDER — POTASSIUM CHLORIDE 10 MEQ/50ML IV SOLN
10.0000 meq | INTRAVENOUS | Status: AC
  Administered 2016-11-23 (×6): 10 meq via INTRAVENOUS
  Filled 2016-11-23 (×8): qty 50

## 2016-11-23 NOTE — Consult Note (Signed)
Date: 11/23/2016                  Patient Name:  Eugene Henderson  MRN: [id number]  DOB: [birth year]  Age / Sex: 66 y.o., male         PCP: Dorcas Carrow, DO                 Service Requesting Consult: Internal medicine/ Houston Siren, MD                 Reason for Consult: Hyponatremia            History of Present Illness: Patient is a 66 y.o. male with medical problems of Asthma, CHF, HTN, hyperthyroidism, alcohol abuse who was admitted to [facility] on 11/22/2016 for evaluation of Vomiting.  Consult for hyponatremia Baseline Na 137 in feb 2018 Admit Na 111, K < 2, Cl < 65, Bicarb 38 Improving with iv Nacl  Home meds include lasix 40 BID   Medications: Outpatient medications: Prescriptions Prior to Admission  Medication Sig Dispense Refill Last Dose  . apixaban (ELIQUIS) 5 MG TABS tablet Take 1 tablet (5 mg total) by mouth 2 (two) times daily. 60 tablet 5 11/22/2016 at 0600  . digoxin (LANOXIN) 0.125 MG tablet Take 1 tablet (0.125 mg total) by mouth daily. 30 tablet 5 11/22/2016 at 0600  . furosemide (LASIX) 40 MG tablet Take 1 tablet (40 mg total) by mouth 2 (two) times daily. 60 tablet 5 11/22/2016 at 0600  . losartan (COZAAR) 25 MG tablet Take 12.5 mg by mouth daily.   11/22/2016 at 0600  . methimazole (TAPAZOLE) 10 MG tablet Take 2 tablets (20 mg total) by mouth 2 (two) times daily. 60 tablet 5 11/22/2016 at 0600  . metoprolol (TOPROL-XL) 200 MG 24 hr tablet Take 1 tablet (200 mg total) by mouth daily. Take with or immediately following a meal. 30 tablet 5 11/22/2016 at 0600  . naproxen sodium (ANAPROX) 220 MG tablet Take 220 mg by mouth 2 (two) times daily with a meal.   PRN at PRN  . propylthiouracil (PTU) 50 MG tablet Take 50 mg by mouth 3 (three) times daily.   11/22/2016 at 0600  . spironolactone (ALDACTONE) 25 MG tablet Take 0.5 tablets (12.5 mg total) by mouth daily. 30 tablet 5 11/22/2016 at 0600  . sacubitril-valsartan (ENTRESTO) 24-26 MG Take 1 tablet by mouth 2 (two) times  daily. (Patient not taking: Reported on 11/22/2016) 60 tablet 5 Not Taking at Unknown time  . traMADol (ULTRAM) 50 MG tablet Take 1 tablet (50 mg total) by mouth every 6 (six) hours as needed. (Patient not taking: Reported on 11/02/2016) 10 tablet 0 Completed Course at Unknown time    Current medications: Current Facility-Administered Medications  Medication Dose Route Frequency Provider Last Rate Last Dose  . 0.9 % NaCl with KCl 40 mEq / L  infusion   Intravenous Continuous Eugenie Norrie, NP 100 mL/hr at 11/23/16 0757 100 mL/hr at 11/23/16 0757  . acetaminophen (TYLENOL) tablet 650 mg  650 mg Oral Q6H PRN Pyreddy, Vivien Rota, MD      . apixaban (ELIQUIS) tablet 5 mg  5 mg Oral BID Ihor Austin, MD   5 mg at 11/22/16 2252  . digoxin (LANOXIN) tablet 0.125 mg  0.125 mg Oral Daily Pyreddy, Pavan, MD      . folic acid (FOLVITE) tablet 1 mg  1 mg Oral Daily Pyreddy, Pavan, MD      . methimazole (TAPAZOLE)  tablet 20 mg  20 mg Oral BID Pyreddy, Vivien Rota, MD      . metoprolol succinate (TOPROL-XL) 24 hr tablet 200 mg  200 mg Oral Daily Pyreddy, Pavan, MD      . multivitamin-lutein (OCUVITE-LUTEIN) capsule 1 capsule  1 capsule Oral Daily Eugenie Norrie, NP      . ondansetron (ZOFRAN) injection 4 mg  4 mg Intravenous Q6H PRN Pyreddy, Pavan, MD      . potassium chloride 10 mEq in 50 mL *CENTRAL LINE* IVPB  10 mEq Intravenous Q1 Hr x 6 Eugenie Norrie, NP 50 mL/hr at 11/23/16 0945 10 mEq at 11/23/16 0945  . propylthiouracil (PTU) tablet 50 mg  50 mg Oral TID Ihor Austin, MD      . sacubitril-valsartan (ENTRESTO) 24-26 mg per tablet  1 tablet Oral BID Pyreddy, Vivien Rota, MD      . senna-docusate (Senokot-S) tablet 1 tablet  1 tablet Oral QHS PRN Ihor Austin, MD      . Melene Muller ON 11/24/2016] thiamine (VITAMIN B-1) tablet 100 mg  100 mg Oral Daily Eugenie Norrie, NP          Allergies: No Known Allergies    Past Medical History: Past Medical History:  Diagnosis Date  . Arthritis   . Asthma   . CHF  (congestive heart failure) (HCC)   . Hypertension   . Hyperthyroidism   . Noncompliance   . Persistent atrial fibrillation [facility])      Past Surgical History: Past Surgical History:  Procedure Laterality Date  . JOINT REPLACEMENT    . RIGHT/LEFT HEART CATH AND CORONARY ANGIOGRAPHY N/A 08/02/2016   Procedure: Right/Left Heart Cath and Coronary Angiography;  Surgeon: Iran Ouch, MD;  Location: [facility] INVASIVE CV LAB;  Service: Cardiovascular;  Laterality: N/A;     Family History: Family History  Problem Relation Age of Onset  . Diabetes Mother   . Diabetes Father   . Diabetes Brother      Social History: Social History   Social History  . Marital status: Single    Spouse name: N/A  . Number of children: N/A  . Years of education: N/A   Occupational History  . retired    Social History Main Topics  . Smoking status: Current Some Day Smoker    Packs/day: 0.25    Years: 25.00    Types: Cigarettes  . Smokeless tobacco: Never Used  . Alcohol use Yes     Comment: Socially - once a month  . Drug use: No  . Sexual activity: Not on file   Other Topics Concern  . Not on file   Social History Narrative  . No narrative on file     Review of Systems: Gen: no fever, chills HEENT: no sore throat, vision problems CV: no chest pain, SOB Resp: no cough or sputum GI: no nausea or vomiting GU : no problems voiding, no hemturia MS: rt knee arthritis, uses cane Derm:  no skin issues Psych:no c/o Heme: no c.o Neuro: + tremors at home Endocrine. No c/o  Vital Signs: Blood pressure 108/83, pulse 65, temperature 97.9 F (36.6 C), temperature source Oral, resp. rate 12, height 6' (1.829 m), weight 122.1 kg (269 lb 2.9 oz), SpO2 98 %.   Intake/Output Summary (Last 24 hours) at 11/23/16 1039 Last data filed at 11/23/16 1018  Gross per 24 hour  Intake          2078.33 ml  Output  2050 ml  Net            28.33 ml    Weight trends: Filed Weights    11/22/16 1311 11/22/16 2030  Weight: 121.1 kg (267 lb) 122.1 kg (269 lb 2.9 oz)    Physical Exam: General:  NAD, sitting up in bed  HEENT Anicteric, moist mucus membranes  Neck:  supple  Lungs: mild rt basilar crackles  Heart:: Irregular, no rub or gallop   Abdomen: Soft, NT  Extremities: No edema  Neurologic: Alert, oreinted  Skin: No acute rashes              Lab results: Basic Metabolic Panel:  Recent Labs Lab 11/22/16 1344 11/22/16 1617 11/22/16 2205 11/23/16 0220 11/23/16 0555  NA 111*  --  117* 117* 117*  K <2.0*  --  2.1* 2.0* 3.2*  CL <65*  --  66* 66* 71*  CO2 38*  --  39* 40* 35*  GLUCOSE 147*  --  110* 151* 131*  BUN 32*  --  26* 24* 20  CREATININE 1.01  --  0.91 0.96 0.89  CALCIUM 8.3*  --  8.3* 8.5* 8.1*  MG 2.0  --   --   --  2.2  PHOS  --  3.0  --   --   --     Liver Function Tests:  Recent Labs Lab 11/22/16 1344  AST 60*  ALT 48  ALKPHOS 94  BILITOT 3.6*  PROT 7.8  ALBUMIN 3.7    Recent Labs Lab 11/22/16 1344 11/23/16 0555  LIPASE 24  --   AMYLASE  --  43   No results for input(s): AMMONIA in the last 168 hours.  CBC:  Recent Labs Lab 11/22/16 1344 11/23/16 0220  WBC 6.6 5.5  HGB 15.0 15.6  HCT 43.4 44.6  MCV 79.5* 80.6  PLT 138* 127*    Cardiac Enzymes:  Recent Labs Lab 11/22/16 1344  TROPONINI 0.03*    BNP: Invalid input(s): POCBNP  CBG:  Recent Labs Lab 11/22/16 2149  GLUCAP 117*    Microbiology: Recent Results (from the past 720 hour(s))  MRSA PCR Screening     Status: None   Collection Time: 11/22/16  9:34 PM  Result Value Ref Range Status   MRSA by PCR NEGATIVE NEGATIVE Final    Comment:        The GeneXpert MRSA Assay (FDA approved for NASAL specimens only), is one component of a comprehensive MRSA colonization surveillance program. It is not intended to diagnose MRSA infection nor to guide or monitor treatment for MRSA infections.      Coagulation Studies: No results for  input(s): LABPROT, INR in the last 72 hours.  Urinalysis:  Recent Labs  11/22/16 1446  COLORURINE YELLOW*  LABSPEC 1.008  PHURINE 6.0  GLUCOSEU NEGATIVE  HGBUR NEGATIVE  BILIRUBINUR NEGATIVE  KETONESUR NEGATIVE  PROTEINUR NEGATIVE  NITRITE NEGATIVE  LEUKOCYTESUR NEGATIVE        Imaging: Dg Chest 2 View  Result Date: 11/22/2016 CLINICAL DATA:  66 year old with acute onset of mid and right-sided chest pain earlier today associated with vomiting, shortness of breath and dizziness. EXAM: CHEST  2 VIEW COMPARISON:  07/28/2016, 06/03/2016, including CTA chest on both of these dates. FINDINGS: Cardiac silhouette markedly enlarged, unchanged. Thoracic aorta tortuous and atherosclerotic, unchanged. Hilar and mediastinal contours otherwise unremarkable. Lungs clear. Bronchovascular markings normal. Pulmonary vascularity normal. No visible pleural effusions. No pneumothorax. Degenerative changes involving the lower thoracic spine. IMPRESSION: Stable cardiomegaly.  No acute cardiopulmonary disease. Electronically Signed   By: Hulan Saas M.D.   On: 11/22/2016 14:30   Dg Chest Port 1 View  Result Date: 11/23/2016 CLINICAL DATA:  Followup central line placement EXAM: PORTABLE CHEST 1 VIEW COMPARISON:  11/22/2016 FINDINGS: Left jugular central line is now seen with the tip in the proximal superior vena cava. No pneumothorax is noted. Cardiac shadow remains enlarged. The lungs are clear. No bony abnormality is noted. IMPRESSION: No pneumothorax following central line placement. Electronically Signed   By: Alcide Clever M.D.   On: 11/23/2016 07:13   Ct Renal Stone Study  Result Date: 11/22/2016 CLINICAL DATA:  Vomiting for 2 days with right-sided pain EXAM: CT ABDOMEN AND PELVIS WITHOUT CONTRAST TECHNIQUE: Multidetector CT imaging of the abdomen and pelvis was performed following the standard protocol without oral or intravenous contrast material administration. COMPARISON:  None. FINDINGS: Lower  chest: There is mild right base atelectasis. Heart appears mildly enlarged. There is a small hiatal hernia. Hepatobiliary: No focal liver lesions are apparent on this noncontrast enhanced study. There is sludge in the gallbladder. Gallbladder wall does not appear appreciably thickened. There is no biliary duct dilatation. Pancreas: There is evidence of fatty replacement in portions of pancreas. No pancreatic mass or inflammatory focus. Spleen: No splenic lesions are evident. Adrenals/Urinary Tract: Adrenals appear unremarkable bilaterally. There is fetal lobulation in each kidney, an anatomic variant. There is no evident renal mass or hydronephrosis on either side. There is no renal or ureteral calculus on either side. The urinary bladder is midline with wall thickness within normal limits. Urinary bladder is mildly distended. Stomach/Bowel: There is no appreciable bowel wall or mesenteric thickening. There is no appreciable bowel obstruction. No evident free air or portal venous air. Vascular/Lymphatic: There is prominence of the proximal abdominal aorta with a measured transverse diameter at the level of the celiac axis of 4.5 x 4.0 cm. The aorta tapers distally. The aorta measures 3.1 x 2.8 cm near the bifurcation. 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 There is aortic atherosclerosis throughout the aorta and common iliac arteries extending into the external and internal iliac arteries. Major mesenteric vessels appear patent on this noncontrast enhanced study. There is no adenopathy in the abdomen or pelvis. Reproductive: There are multiple prostatic calculi. Prostate and seminal vesicles appear normal in size and contour. No pelvic mass evident. Other: There is mesh along the anterior abdominal wall. In the area of the mesh, there is a fairly small ventral hernia containing only fat. The appendix appears normal. There is  no ascites or abscess in the abdomen or pelvis. Musculoskeletal: There is multifocal degenerative change in the lumbar spine with vacuum phenomenon at L2-3 and L3-4. There is mid lumbar dextroscoliosis. 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 There is a questionable degree of avascular necrosis in the lateral left femoral head. No blastic or lytic bone lesions are evident. There is no intramuscular or abdominal wall lesion. IMPRESSION: No bowel wall thickening or bowel obstruction.  No free air. Dilatation of the proximal abdominal aorta with a maximum diameter 4.5 x 4.0 cm. Aorta tapers distally, measuring 3.1 x 2.8 cm near the bifurcation. 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  Recommend followup by abdomen and pelvis CTA in 6 months, and vascular surgery referral/consultation if not already obtained. This recommendation follows ACR consensus guidelines: White Paper of the ACR Incidental Findings Committee II on Vascular Findings. J Am Coll Radiol 2013; 10:789-794. Multiple prostatic calculi. No renal or ureteral calculi. No hydronephrosis. Appendix appears normal.  No abscess. Small hiatal hernia. Ventral hernia containing only fat. Evidence of previous hernia repair along the anterior abdominal wall. Fairly advanced arthropathy in the left hip joint with multiple cystic areas on both sides of the joint. Question degree of underlying avascular necrosis in the lateral femoral head on the left. Electronically Signed   By: Bretta Bang III M.D.   On: 11/22/2016 17:48      Assessment & Plan: Pt is a 66 y.o. [place]  male with Asthma, CHF, HTN, hyperthyroidism, alcohol abuse, A Fib, was admitted on 11/22/2016 with Tremors, weakness and unsteady gait and found to have severe hyponatremia.   1.  Hyponatremia and Hypokalemia with contraction alkalosis - baseline Na normal in Feb - Admit Na 111 --> 117 with iv NS - Underlying cause appears to be over diuresis with lasix in setting of alcohol abuse also leading to dehydration Plan: Agree with iv NS with Kcl supplementation May d/c lasix an use only on PRN basis even as outpatient D/c spironolactone Follow Na level closely

## 2016-11-23 NOTE — Consult Note (Signed)
[facility] Vascular Consult Note  MRN : [id number]  Eugene Henderson is a 66 y.o. (31-Oct-1950) male who presents with chief complaint of  Chief Complaint  Patient presents with  . Chest Pain  .  History of Present Illness: I am asked to evaluate the patient by Ms. Blakeney for incidental finding of abdominal aortic aneurysm.  The patient is a 66 year old male who presented yesterday to [facility] with nausea and vomiting of 3 days' duration. It was associated with chest pain as well as abdominal pain. He rated the pain as a 4 out of 10. He drinks alcohol frequently and is last drink by his admission was Sunday night. Because of his abdominal pain CT scan was ordered which showed an abdominal aortic aneurysm measuring 4.5 cm in diameter.  Current Facility-Administered Medications  Medication Dose Route Frequency Provider Last Rate Last Dose  . 0.9 % NaCl with KCl 40 mEq / L  infusion   Intravenous Continuous Mosetta Pigeon, MD 30 mL/hr at 11/23/16 1639 30 mL/hr at 11/23/16 1639  . acetaminophen (TYLENOL) tablet 650 mg  650 mg Oral Q6H PRN Ihor Austin, MD   650 mg at 11/23/16 1252  . apixaban (ELIQUIS) tablet 5 mg  5 mg Oral BID Ihor Austin, MD   5 mg at 11/23/16 1109  . digoxin (LANOXIN) tablet 0.125 mg  0.125 mg Oral Daily Pyreddy, Pavan, MD   0.125 mg at 11/23/16 1203  . folic acid (FOLVITE) tablet 1 mg  1 mg Oral Daily Pyreddy, Pavan, MD   1 mg at 11/23/16 1109  . methimazole (TAPAZOLE) tablet 20 mg  20 mg Oral BID Ihor Austin, MD   20 mg at 11/23/16 1109  . [START ON 11/24/2016] metoprolol succinate (TOPROL-XL) 24 hr tablet 100 mg  100 mg Oral Daily Merwyn Katos, MD      . multivitamin-lutein (OCUVITE-LUTEIN) capsule 1 capsule  1 capsule Oral Daily Eugenie Norrie, NP   1 capsule at 11/23/16 1203  . ondansetron (ZOFRAN) injection 4 mg  4 mg Intravenous Q6H PRN Pyreddy, Pavan, MD      . propylthiouracil (PTU) tablet 50 mg  50 mg Oral TID Ihor Austin, MD   50 mg at 11/23/16 1110  . sacubitril-valsartan (ENTRESTO) 24-26 mg per tablet  1 tablet Oral BID Ihor Austin, MD   1 tablet at 11/23/16 1110  . senna-docusate (Senokot-S) tablet 1 tablet  1 tablet Oral QHS PRN Ihor Austin, MD      . Melene Muller ON 11/24/2016] thiamine (VITAMIN B-1) tablet 100 mg  100 mg Oral Daily Eugenie Norrie, NP        Past Medical History:  Diagnosis Date  . Arthritis   . Asthma   . CHF (congestive heart failure) (HCC)   . Hypertension   . Hyperthyroidism   . Noncompliance   . Persistent atrial fibrillation [facility]     Past Surgical History:  Procedure Laterality Date  . JOINT REPLACEMENT    . RIGHT/LEFT HEART CATH AND CORONARY ANGIOGRAPHY N/A 08/02/2016   Procedure: Right/Left Heart Cath and Coronary Angiography;  Surgeon: Iran Ouch, MD;  Location: [facility] INVASIVE CV LAB;  Service: Cardiovascular;  Laterality: N/A;    Social History Social History  Substance Use Topics  . Smoking status: Current Some Day Smoker    Packs/day: 0.25    Years: 25.00    Types: Cigarettes  . Smokeless tobacco: Never Used  . Alcohol use Yes     Comment:  Socially - once a month    Family History Family History  Problem Relation Age of Onset  . Diabetes Mother   . Diabetes Father   . Diabetes Brother   No family history of bleeding/clotting disorders, porphyria or autoimmune disease   No Known Allergies   REVIEW OF SYSTEMS (Negative unless checked)  Constitutional: [] Weight loss  [] Fever  [] Chills Cardiac: [] Chest pain   [] Chest pressure   [] Palpitations   [] Shortness of breath when laying flat   [] Shortness of breath at rest   [] Shortness of breath with exertion. Vascular:  [] Pain in legs with walking   [] Pain in legs at rest   [] Pain in legs when laying flat   [] Claudication   [] Pain in feet when walking  [] Pain in feet at rest  [] Pain in feet when laying flat   [] History of DVT   [] Phlebitis   [] Swelling in legs   [] Varicose veins   [] Non-healing  ulcers Pulmonary:   [] Uses home oxygen   [] Productive cough   [] Hemoptysis   [] Wheeze  [] COPD   [] Asthma Neurologic:  [] Dizziness  [] Blackouts   [] Seizures   [] History of stroke   [] History of TIA  [] Aphasia   [] Temporary blindness   [] Dysphagia   [] Weakness or numbness in arms   [] Weakness or numbness in legs Musculoskeletal:  [] Arthritis   [] Joint swelling   [] Joint pain   [] Low back pain Hematologic:  [] Easy bruising  [] Easy bleeding   [] Hypercoagulable state   [] Anemic  [] Hepatitis Gastrointestinal:  [] Blood in stool   [] Vomiting blood  [] Gastroesophageal reflux/heartburn   [] Difficulty swallowing. Genitourinary:  [] Chronic kidney disease   [] Difficult urination  [] Frequent urination  [] Burning with urination   [] Blood in urine Skin:  [] Rashes   [] Ulcers   [] Wounds Psychological:  [] History of anxiety   []  History of major depression.  Physical Examination  Vitals:   11/23/16 0800 11/23/16 0900 11/23/16 1203 11/23/16 1453  BP: 105/70 108/83  112/89  Pulse: 86 65 87 (!) 59  Resp: 14 12  16   Temp:    97.6 F (36.4 C)  TempSrc:    Oral  SpO2: 97% 98%  99%  Weight:    122.7 kg (270 lb 9.6 oz)  Height:    6' (1.829 m)   Body mass index is 36.7 kg/m. Gen:  WD/WN, NAD Head: [place]/AT, No temporalis wasting. Prominent temp pulse not noted. Ear/Nose/Throat: Hearing grossly intact, nares w/o erythema or drainage, oropharynx w/o Erythema/Exudate Eyes: Sclera non-icteric, conjunctiva clear Neck: Trachea midline.  No JVD.  Pulmonary:  Good air movement, respirations not labored, equal bilaterally.  Cardiac: RRR, normal S1, S2. Vascular:  Vessel Right Left  Radial Palpable Palpable  Ulnar Palpable Palpable  Brachial Palpable Palpable  Carotid Palpable, without bruit Palpable, without bruit  Aorta Not palpable N/A  Femoral Palpable Palpable  Popliteal Palpable Palpable  PT Palpable Palpable  DP Palpable Palpable   Gastrointestinal: soft, non-tender/non-distended. No guarding/reflex.   Musculoskeletal: M/S 5/5 throughout.  Extremities without ischemic changes.  No deformity or atrophy. No edema. Neurologic: Sensation grossly intact in extremities.  Symmetrical.  Speech is fluent. Motor exam as listed above. Psychiatric: Judgment intact, Mood & affect appropriate for pt's clinical situation. Dermatologic: No rashes or ulcers noted.  No cellulitis or open wounds. Lymph : No Cervical, Axillary, or Inguinal lymphadenopathy.   CBC Lab Results  Component Value Date   WBC 5.5 11/23/2016   HGB 15.6 11/23/2016   HCT 44.6 11/23/2016   MCV 80.6 11/23/2016  PLT 127 (L) 11/23/2016    BMET    Component Value Date/Time   NA 121 (L) 11/23/2016 1120   K 3.2 (L) 11/23/2016 0555   CL 71 (L) 11/23/2016 0555   CO2 35 (H) 11/23/2016 0555   GLUCOSE 131 (H) 11/23/2016 0555   BUN 20 11/23/2016 0555   CREATININE 0.89 11/23/2016 0555   CALCIUM 8.1 (L) 11/23/2016 0555   GFRNONAA >60 11/23/2016 0555   GFRAA >60 11/23/2016 0555   Estimated Creatinine Clearance: 111.9 mL/min (by C-G formula based on SCr of 0.89 mg/dL).  COAG Lab Results  Component Value Date   INR 1.07 07/28/2016    Radiology Dg Chest 2 View  Result Date: 11/22/2016 CLINICAL DATA:  66 year old with acute onset of mid and right-sided chest pain earlier today associated with vomiting, shortness of breath and dizziness. EXAM: CHEST  2 VIEW COMPARISON:  07/28/2016, 06/03/2016, including CTA chest on both of these dates. FINDINGS: Cardiac silhouette markedly enlarged, unchanged. Thoracic aorta tortuous and atherosclerotic, unchanged. Hilar and mediastinal contours otherwise unremarkable. Lungs clear. Bronchovascular markings normal. Pulmonary vascularity normal. No visible pleural effusions. No pneumothorax. Degenerative changes involving the lower thoracic spine. IMPRESSION: Stable cardiomegaly.  No acute cardiopulmonary disease. Electronically Signed   By: Hulan Saas M.D.   On: 11/22/2016 14:30   Dg Chest Port 1  View  Result Date: 11/23/2016 CLINICAL DATA:  Followup central line placement EXAM: PORTABLE CHEST 1 VIEW COMPARISON:  11/22/2016 FINDINGS: Left jugular central line is now seen with the tip in the proximal superior vena cava. No pneumothorax is noted. Cardiac shadow remains enlarged. The lungs are clear. No bony abnormality is noted. IMPRESSION: No pneumothorax following central line placement. Electronically Signed   By: Alcide Clever M.D.   On: 11/23/2016 07:13   Ct Renal Stone Study  Result Date: 11/22/2016 CLINICAL DATA:  Vomiting for 2 days with right-sided pain EXAM: CT ABDOMEN AND PELVIS WITHOUT CONTRAST TECHNIQUE: Multidetector CT imaging of the abdomen and pelvis was performed following the standard protocol without oral or intravenous contrast material administration. COMPARISON:  None. FINDINGS: Lower chest: There is mild right base atelectasis. Heart appears mildly enlarged. There is a small hiatal hernia. Hepatobiliary: No focal liver lesions are apparent on this noncontrast enhanced study. There is sludge in the gallbladder. Gallbladder wall does not appear appreciably thickened. There is no biliary duct dilatation. Pancreas: There is evidence of fatty replacement in portions of pancreas. No pancreatic mass or inflammatory focus. Spleen: No splenic lesions are evident. Adrenals/Urinary Tract: Adrenals appear unremarkable bilaterally. There is fetal lobulation in each kidney, an anatomic variant. There is no evident renal mass or hydronephrosis on either side. There is no renal or ureteral calculus on either side. The urinary bladder is midline with wall thickness within normal limits. Urinary bladder is mildly distended. Stomach/Bowel: There is no appreciable bowel wall or mesenteric thickening. There is no appreciable bowel obstruction. No evident free air or portal venous air. Vascular/Lymphatic: There is prominence of the proximal abdominal aorta with a measured transverse diameter at the  level of the celiac axis of 4.5 x 4.0 cm. The aorta tapers distally. The aorta measures 3.1 x 2.8 cm near the bifurcation. 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 There is aortic atherosclerosis throughout the aorta and common iliac arteries extending into the external and internal iliac arteries. Major mesenteric vessels appear patent on this noncontrast enhanced study. There  is no adenopathy in the abdomen or pelvis. Reproductive: There are multiple prostatic calculi. Prostate and seminal vesicles appear normal in size and contour. No pelvic mass evident. Other: There is mesh along the anterior abdominal wall. In the area of the mesh, there is a fairly small ventral hernia containing only fat. The appendix appears normal. There is no ascites or abscess in the abdomen or pelvis. Musculoskeletal: There is multifocal degenerative change in the lumbar spine with vacuum phenomenon at L2-3 and L3-4. There is mid lumbar dextroscoliosis. 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 There is a questionable degree of avascular necrosis in the lateral left femoral head. No blastic or lytic bone lesions are evident. There is no intramuscular or abdominal wall lesion. IMPRESSION: No bowel wall thickening or bowel obstruction.  No free air. Dilatation of the proximal abdominal aorta with a maximum diameter 4.5 x 4.0 cm. Aorta tapers distally, measuring 3.1 x 2.8 cm near the bifurcation. 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 Recommend followup by abdomen and pelvis CTA in 6 months, and vascular surgery referral/consultation if not already obtained. This recommendation follows ACR consensus guidelines: White Paper of the ACR Incidental  Findings Committee II on Vascular Findings. J Am Coll Radiol 2013; 10:789-794. Multiple prostatic calculi. No renal or ureteral calculi. No hydronephrosis. Appendix appears normal.  No abscess. Small hiatal hernia. Ventral hernia containing only fat. Evidence of previous hernia repair along the anterior abdominal wall. Fairly advanced arthropathy in the left hip joint with multiple cystic areas on both sides of the joint. Question degree of underlying avascular necrosis in the lateral femoral head on the left. Electronically Signed   By: Bretta Bang III M.D.   On: 11/22/2016 17:48      Assessment/Plan 1. Abdominal aortic aneurysm:  The aneurysm is located within the visceral segment of the abdominal aorta and therefore will require a fenestrated stent graft for repair. However it is not reached a diameter which requires fixing the aneurysm and therefore I will continue to follow him with CT scans or ultrasound. I will attempt an ultrasound in 6 months and if this allows adequate visualization will continue to use ultrasound for surveillance. He will follow-up with me in the office after discharge. 2. Congestive heart failure: Judicious demonstration of IV fluids will continue his electrolytes will be corrected. 3. Atypical chest pain: Patient is ruled out for MI   Levora Dredge, MD  11/23/2016 5:12 PM    This note was created with Dragon medical transcription system.  Any error is purely unintentional

## 2016-11-23 NOTE — Procedures (Signed)
Central Venous Catheter Insertion Procedure Note Julia Dec [id number] [birth year]  Procedure: Insertion of Central Venous Catheter Indications: Assessment of intravascular volume, Drug and/or fluid administration and Frequent blood sampling  Procedure Details Consent: Risks of procedure as well as the alternatives and risks of each were explained to the (patient/caregiver).  Consent for procedure obtained. Time Out: Verified patient identification, verified procedure, site/side was marked, verified correct patient position, special equipment/implants available, medications/allergies/relevent history reviewed, required imaging and test results available.  Performed  Maximum sterile technique was used including antiseptics, cap, gloves, gown, hand hygiene, mask and sheet. Skin prep: Chlorhexidine; local anesthetic administered A antimicrobial bonded/coated triple lumen catheter was placed in the left internal jugular vein using the Seldinger technique.  Evaluation Blood flow good Complications: No apparent complications Patient did tolerate procedure well. Chest X-ray ordered to verify placement.  CXR: normal.  Left internal jugular central line placed utilizing ultrasound no complications noted during or following procedure.  Sonda Rumble, AGNP  Pulmonary/Critical Care Pager [phone] (please enter 7 digits) PCCM Consult Pager [phone] (please enter 7 digits)  Billy Fischer, MD PCCM service Mobile [phone] Pager [phone] 11/23/2016 2:17 PM

## 2016-11-23 NOTE — Progress Notes (Signed)
Report given to [facility] Patient being transferred to 1C room 113.

## 2016-11-23 NOTE — Progress Notes (Signed)
[employer] - [place] at [facility]   PATIENT NAME: Eugene Henderson    MR#:  [id number]  DATE OF BIRTH:  1950-12-15  SUBJECTIVE:   Patient here due to acute hyponatremia. She here due to nausea vomiting and noted to be severely hyponatremic. No other acute events or complaints presently.  REVIEW OF SYSTEMS:    Review of Systems  Constitutional: Negative for chills and fever.  HENT: Negative for congestion and tinnitus.   Eyes: Negative for blurred vision and double vision.  Respiratory: Negative for cough, shortness of breath and wheezing.   Cardiovascular: Negative for chest pain, orthopnea and PND.  Gastrointestinal: Negative for abdominal pain, diarrhea, nausea and vomiting.  Genitourinary: Negative for dysuria and hematuria.  Neurological: Negative for dizziness, sensory change and focal weakness.  All other systems reviewed and are negative.   Nutrition: Heart Healthy Tolerating Diet: Yes Tolerating PT: Await Eval.   DRUG ALLERGIES:  No Known Allergies  VITALS:  Blood pressure 112/89, pulse (!) 59, temperature 97.6 F (36.4 C), temperature source Oral, resp. rate 16, height 6' (1.829 m), weight 122.7 kg (270 lb 9.6 oz), SpO2 99 %.  PHYSICAL EXAMINATION:   Physical Exam  GENERAL:  66 y.o.-year-old patient lying in bed in no acute distress.  EYES: Pupils equal, round, reactive to light and accommodation. No scleral icterus. Extraocular muscles intact.  HEENT: Head atraumatic, normocephalic. Oropharynx and nasopharynx clear.  NECK:  Supple, no jugular venous distention. No thyroid enlargement, no tenderness.  LUNGS: Normal breath sounds bilaterally, no wheezing, rales, rhonchi. No use of accessory muscles of respiration.  CARDIOVASCULAR: S1, S2 normal. No murmurs, rubs, or gallops.  ABDOMEN: Soft, nontender, nondistended. Bowel sounds present. No organomegaly or mass.  EXTREMITIES: No cyanosis, clubbing or edema b/l.    NEUROLOGIC: Cranial nerves II  through XII are intact. No focal Motor or sensory deficits b/l.   PSYCHIATRIC: The patient is alert and oriented x 3.  SKIN: No obvious rash, lesion, or ulcer.    LABORATORY PANEL:   CBC  Recent Labs Lab 11/23/16 0220  WBC 5.5  HGB 15.6  HCT 44.6  PLT 127*   ------------------------------------------------------------------------------------------------------------------  Chemistries   Recent Labs Lab 11/22/16 1344  11/23/16 0555 11/23/16 1120  NA 111*  < > 117* 121*  K <2.0*  < > 3.2*  --   CL <65*  < > 71*  --   CO2 38*  < > 35*  --   GLUCOSE 147*  < > 131*  --   BUN 32*  < > 20  --   CREATININE 1.01  < > 0.89  --   CALCIUM 8.3*  < > 8.1*  --   MG 2.0  --  2.2  --   AST 60*  --   --   --   ALT 48  --   --   --   ALKPHOS 94  --   --   --   BILITOT 3.6*  --   --   --   < > = values in this interval not displayed. ------------------------------------------------------------------------------------------------------------------  Cardiac Enzymes  Recent Labs Lab 11/22/16 1344  TROPONINI 0.03*   ------------------------------------------------------------------------------------------------------------------  RADIOLOGY:  Dg Chest 2 View  Result Date: 11/22/2016 CLINICAL DATA:  66 year old with acute onset of mid and right-sided chest pain earlier today associated with vomiting, shortness of breath and dizziness. EXAM: CHEST  2 VIEW COMPARISON:  07/28/2016, 06/03/2016, including CTA chest on both of these dates. FINDINGS: Cardiac silhouette  markedly enlarged, unchanged. Thoracic aorta tortuous and atherosclerotic, unchanged. Hilar and mediastinal contours otherwise unremarkable. Lungs clear. Bronchovascular markings normal. Pulmonary vascularity normal. No visible pleural effusions. No pneumothorax. Degenerative changes involving the lower thoracic spine. IMPRESSION: Stable cardiomegaly.  No acute cardiopulmonary disease. Electronically Signed   By: Hulan Saas  M.D.   On: 11/22/2016 14:30   Dg Chest Port 1 View  Result Date: 11/23/2016 CLINICAL DATA:  Followup central line placement EXAM: PORTABLE CHEST 1 VIEW COMPARISON:  11/22/2016 FINDINGS: Left jugular central line is now seen with the tip in the proximal superior vena cava. No pneumothorax is noted. Cardiac shadow remains enlarged. The lungs are clear. No bony abnormality is noted. IMPRESSION: No pneumothorax following central line placement. Electronically Signed   By: Alcide Clever M.D.   On: 11/23/2016 07:13   Ct Renal Stone Study  Result Date: 11/22/2016 CLINICAL DATA:  Vomiting for 2 days with right-sided pain EXAM: CT ABDOMEN AND PELVIS WITHOUT CONTRAST TECHNIQUE: Multidetector CT imaging of the abdomen and pelvis was performed following the standard protocol without oral or intravenous contrast material administration. COMPARISON:  None. FINDINGS: Lower chest: There is mild right base atelectasis. Heart appears mildly enlarged. There is a small hiatal hernia. Hepatobiliary: No focal liver lesions are apparent on this noncontrast enhanced study. There is sludge in the gallbladder. Gallbladder wall does not appear appreciably thickened. There is no biliary duct dilatation. Pancreas: There is evidence of fatty replacement in portions of pancreas. No pancreatic mass or inflammatory focus. Spleen: No splenic lesions are evident. Adrenals/Urinary Tract: Adrenals appear unremarkable bilaterally. There is fetal lobulation in each kidney, an anatomic variant. There is no evident renal mass or hydronephrosis on either side. There is no renal or ureteral calculus on either side. The urinary bladder is midline with wall thickness within normal limits. Urinary bladder is mildly distended. Stomach/Bowel: There is no appreciable bowel wall or mesenteric thickening. There is no appreciable bowel obstruction. No evident free air or portal venous air. Vascular/Lymphatic: There is prominence of the proximal abdominal aorta  with a measured transverse diameter at the level of the celiac axis of 4.5 x 4.0 cm. The aorta tapers distally. The aorta measures 3.1 x 2.8 cm near the bifurcation. 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 There is aortic atherosclerosis throughout the aorta and common iliac arteries extending into the external and internal iliac arteries. Major mesenteric vessels appear patent on this noncontrast enhanced study. There is no adenopathy in the abdomen or pelvis. Reproductive: There are multiple prostatic calculi. Prostate and seminal vesicles appear normal in size and contour. No pelvic mass evident. Other: There is mesh along the anterior abdominal wall. In the area of the mesh, there is a fairly small ventral hernia containing only fat. The appendix appears normal. There is no ascites or abscess in the abdomen or pelvis. Musculoskeletal: There is multifocal degenerative change in the lumbar spine with vacuum phenomenon at L2-3 and L3-4. There is mid lumbar dextroscoliosis. 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 There is a questionable degree of avascular necrosis in the lateral left femoral head. No blastic or lytic bone lesions are evident. There is no intramuscular or abdominal wall lesion. IMPRESSION: No bowel wall thickening or bowel obstruction.  No free air. Dilatation of the proximal abdominal aorta  with a maximum diameter 4.5 x 4.0 cm. Aorta tapers distally, measuring 3.1 x 2.8 cm near the bifurcation. 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 Recommend followup by abdomen and pelvis CTA in 6 months, and vascular surgery referral/consultation if not already obtained. This recommendation follows ACR consensus  guidelines: White Paper of the ACR Incidental Findings Committee II on Vascular Findings. J Am Coll Radiol 2013; 10:789-794. Multiple prostatic calculi. No renal or ureteral calculi. No hydronephrosis. Appendix appears normal.  No abscess. Small hiatal hernia. Ventral hernia containing only fat. Evidence of previous hernia repair along the anterior abdominal wall. Fairly advanced arthropathy in the left hip joint with multiple cystic areas on both sides of the joint. Question degree of underlying avascular necrosis in the lateral femoral head on the left. Electronically Signed   By: Bretta Bang III M.D.   On: 11/22/2016 17:48     ASSESSMENT AND PLAN:   66 year old male with past medical history of atrial fibrillation, hypertension, CHF, hypothyroidism, alcohol abuse who presents to the hospital due to nausea vomiting and noted to be acutely hyponatremic.  1. Hyponatremia-this is hypovolemic hyponatremia secondary to diuretic use and also patient's ongoing alcohol abuse.  - Appreciate nephrology input, continue IV fluids and follow sodium. -Continue to hold diuretics for now.  2. Hypokalemia-also secondary to dehydration, alcohol abuse and diuretic use. -Continue supplemental it's improving. Magnesium level normal.  3. Abnormal CT scan - patient noted to have a dilated proximal abdominal aorta and with some proximal iliac artery aneurysm. -Await vascular surgery input.  4. Chronic atrial fibrillation-rate controlled. Continue digoxin, metoprolol -cont. Eliquis  5. Hyperthyroidism - cont. PTU, Methimazole.   6. Hx of CHF - clinically not in CHF - cont. Metoprolol, Entresto  7. Hx of ETOH abuse - no evidence of withdrawal.  - cont. Thiamine.   Await PT eval.   All the records are reviewed and case discussed with Care Management/Social Worker. Management plans discussed with the patient, family and they are in agreement.  CODE STATUS: Full code  DVT Prophylaxis:  Eliquis   TOTAL TIME TAKING CARE OF THIS PATIENT: 30 minutes.   POSSIBLE D/C IN 1-2 DAYS, DEPENDING ON CLINICAL CONDITION.   Houston Siren M.D on 11/23/2016 at 3:35 PM  Between 7am to 6pm - Pager - [phone]  After 6pm go to [email or web address] - Scientist, research (life sciences) [facility]  Office  [phone]  CC: Primary care physician; Dorcas Carrow, DO

## 2016-11-23 NOTE — Progress Notes (Signed)
Pharmacy Consult for Electrolyte Management Indication: hypokalemia  No Known Allergies  Patient Measurements: Height: 6' (182.9 cm) Weight: 270 lb 9.6 oz (122.7 kg) IBW/kg (Calculated) : 77.6   Vital Signs: Temp: 97.6 F (36.4 C) (06/19 1453) Temp Source: Oral (06/19 1453) BP: 112/89 (06/19 1453) Pulse Rate: 59 (06/19 1453) Intake/Output from previous day: 06/18 0701 - 06/19 0700 In: 1150 [IV Piggyback:1150] Out: 1150 [Urine:1150] Intake/Output from this shift: Total I/O In: 1168.3 [P.O.:240; I.V.:928.3] Out: 1500 [Urine:1500]  Labs:  Recent Labs  11/22/16 1344 11/22/16 1617 11/22/16 2205 11/23/16 0220 11/23/16 0555  WBC 6.6  --   --  5.5  --   HGB 15.0  --   --  15.6  --   HCT 43.4  --   --  44.6  --   PLT 138*  --   --  127*  --   CREATININE 1.01  --  0.91 0.96 0.89  MG 2.0  --   --   --  2.2  PHOS  --  3.0  --   --   --   ALBUMIN 3.7  --   --   --   --   PROT 7.8  --   --   --   --   AST 60*  --   --   --   --   ALT 48  --   --   --   --   ALKPHOS 94  --   --   --   --   BILITOT 3.6*  --   --   --   --    Estimated Creatinine Clearance: 111.9 mL/min (by C-G formula based on SCr of 0.89 mg/dL).   Potassium (mmol/L)  Date Value  11/23/2016 3.2 (L)   Calcium (mg/dL)  Date Value  13/24/4010 8.1 (L)   Sodium (mmol/L)  Date Value  11/23/2016 121 (L)     Assessment: 66 y/o M admitted with hypokalemia and hyponatremia. NS with 40 meq KCL running at 100 ml/hr.    Plan:  Na 111 >> 121 in a little over 24 hours. After discussion with Dr. Thedore Mins, will reduce rate of IVF to 30 ml/hr and continue to follow serial sodiums. K replaced with 6 runs IV. Will f/u labs at 1921.  Luisa Hart D 11/23/2016,4:00 PM

## 2016-11-24 LAB — BASIC METABOLIC PANEL
Anion gap: 6 (ref 5–15)
Anion gap: 3 — ABNORMAL LOW (ref 5–15)
BUN: 13 mg/dL (ref 6–20)
BUN: 14 mg/dL (ref 6–20)
CALCIUM: 8.6 mg/dL — AB (ref 8.9–10.3)
CALCIUM: 8.8 mg/dL — AB (ref 8.9–10.3)
CHLORIDE: 84 mmol/L — AB (ref 101–111)
CHLORIDE: 90 mmol/L — AB (ref 101–111)
CO2: 34 mmol/L — ABNORMAL HIGH (ref 22–32)
CO2: 35 mmol/L — AB (ref 22–32)
CREATININE: 0.76 mg/dL (ref 0.61–1.24)
CREATININE: 0.75 mg/dL (ref 0.61–1.24)
GFR calc non Af Amer: >60 mL/min (ref >60)
Glucose, Bld: 137 mg/dL — ABNORMAL HIGH (ref 65–99)
Glucose, Bld: 132 mg/dL — ABNORMAL HIGH (ref 65–99)
Potassium: 2.8 mmol/L — ABNORMAL LOW (ref 3.5–5.1)
Potassium: 3.6 mmol/L (ref 3.5–5.1)
SODIUM: 124 mmol/L — AB (ref 135–145)
Sodium: 128 mmol/L — ABNORMAL LOW (ref 135–145)

## 2016-11-24 LAB — SODIUM
SODIUM: 127 mmol/L — AB (ref 135–145)
Sodium: 123 mmol/L — ABNORMAL LOW (ref 135–145)

## 2016-11-24 LAB — MAGNESIUM: MAGNESIUM: 2.1 mg/dL (ref 1.7–2.4)

## 2016-11-24 MED ORDER — BOOST / RESOURCE BREEZE PO LIQD
1.0000 | Freq: Two times a day (BID) | ORAL | Status: DC
  Administered 2016-11-24 – 2016-11-26 (×3): 1 via ORAL

## 2016-11-24 MED ORDER — SPIRONOLACTONE 25 MG PO TABS
25.0000 mg | ORAL_TABLET | Freq: Every day | ORAL | Status: DC
  Administered 2016-11-24 – 2016-11-26 (×3): 25 mg via ORAL
  Filled 2016-11-24 (×3): qty 1

## 2016-11-24 MED ORDER — POTASSIUM CHLORIDE 10 MEQ/100ML IV SOLN
10.0000 meq | INTRAVENOUS | Status: AC
  Administered 2016-11-24 (×4): 10 meq via INTRAVENOUS
  Filled 2016-11-24 (×4): qty 100

## 2016-11-24 MED ORDER — POTASSIUM CHLORIDE CRYS ER 20 MEQ PO TBCR
40.0000 meq | EXTENDED_RELEASE_TABLET | Freq: Once | ORAL | Status: AC
  Administered 2016-11-24: 11:00:00 40 meq via ORAL
  Filled 2016-11-24: qty 2

## 2016-11-24 NOTE — Evaluation (Signed)
Physical Therapy Evaluation Patient Details Name: Eugene Henderson MRN: [id number] DOB: 02-07-1951 Today's Date: 11/24/2016   History of Present Illness  presented to ER sceondary to nausea/vomiting x3 days, chest tightness and epigastric pain; admitted with hyponatremia, hypokalemia (current Na+ 124, K+ 2.8).  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  Clinical Impression  Upon evaluation, patient alert and oriented; follows all commands and demonstrates fair insight.  Somewhat impulsive, requiring frequent encouragement and redirection throughout session.  Patient initially preferring to utilize [facility] for mobiltiy efforts; however, very unsteady and unsafe, requiring hands-on assist at all times to maintain balance.  Transitioned to RW for additional mobility efforts with noted improvement in functional mobility.  Able to complete all transfers and gait (50' x2) with RW, cga/min assist using RW.  PAtient subjectively verbalizing improved comfort/confidence with use of RW; do recommend continued use with all mobility efforts at this time. Will need to trial stairs prior to discharge. Would benefit from skilled PT to address above deficits and promote optimal return to PLOF; Recommend transition to HHPT upon discharge from acute hospitalization.     Follow Up Recommendations Home health PT    Equipment Recommendations  Rolling walker with 5" wheels    Recommendations for Other Services       Precautions / Restrictions Precautions Precautions: Fall Restrictions Weight Bearing Restrictions: No      Mobility  Bed Mobility Overal bed mobility: Modified Independent                Transfers Overall transfer level: Needs assistance Equipment used: Straight cane Transfers: Sit to/from Stand Sit to Stand: Min assist         General transfer comment: very unsteady and unsafe, improved to cga/min assist with use of RW (cuing for  hand palcement during sit/stand and stand/sit, limited eccentric control apparent)  Ambulation/Gait Ambulation/Gait assistance: Min guard Ambulation Distance (Feet): 50 Feet (x2)         General Gait Details: reciprocal stepping pattern with fair step height/length; decreased cadence with effortful gait performance. Heavy WBing bilat UEs, but no overt buckling or LOB.  Stairs            Wheelchair Mobility    Modified Rankin (Stroke Patients Only)       Balance Overall balance assessment: Needs assistance Sitting-balance support: No upper extremity supported Sitting balance-Leahy Scale: Good     Standing balance support: Bilateral upper extremity supported Standing balance-Leahy Scale: Fair                               Pertinent Vitals/Pain Pain Assessment: Faces Faces Pain Scale: Hurts even more Pain Location: R knee Pain Descriptors / Indicators: Grimacing Pain Intervention(s): Limited activity within patient's tolerance;Monitored during session;Repositioned    Home Living Family/patient expects to be discharged to:: Private residence Living Arrangements: Children Available Help at Discharge: Family;Available 24 hours/day Type of Home: House Home Access: Stairs to enter   [employer] of Steps: 8 Home Layout: Two level;Bed/bath upstairs Home Equipment: Cane - single point      Prior Function Level of Independence: Independent with assistive device(s)         Comments: Mod indep with SPC for ADLs, household mobility; does endorse fall history, but does not elaborate     Hand Dominance        Extremity/Trunk Assessment   Upper Extremity Assessment Upper Extremity Assessment:  Overall [facility] for tasks assessed    Lower Extremity Assessment Lower Extremity Assessment: Overall WFL for tasks assessed (grossly at least 4/5 throughout)       Communication   Communication: No difficulties  Cognition Arousal/Alertness:  Awake/alert Behavior During Therapy: WFL for tasks assessed/performed Overall Cognitive Status: Within Functional Limits for tasks assessed                                        General Comments      Exercises Other Exercises Other Exercises: Sit/stand x4 with RW, cga/close sup--improved safety/indep with good hand placement.  Requires UE support to control descent wtih stand to sit.   Assessment/Plan    PT Assessment Patient needs continued PT services  PT Problem List Decreased strength;Decreased range of motion;Decreased activity tolerance;Decreased balance;Decreased mobility;Decreased coordination;Decreased cognition;Decreased knowledge of use of DME;Decreased safety awareness;Decreased knowledge of precautions;Pain       PT Treatment Interventions DME instruction;Gait training;Stair training;Functional mobility training;Therapeutic activities;Therapeutic exercise;Balance training;Cognitive remediation;Patient/family education    PT Goals (Current goals can be found in the Care Plan section)  Acute Rehab PT Goals Patient Stated Goal: to go back home PT Goal Formulation: With patient Time For Goal Achievement: 12/08/16 Potential to Achieve Goals: Good    Frequency Min 2X/week   Barriers to discharge Decreased caregiver support      Co-evaluation               AM-PAC PT "6 Clicks" Daily Activity  Outcome Measure Difficulty turning over in bed (including adjusting bedclothes, sheets and blankets)?: None Difficulty moving from lying on back to sitting on the side of the bed? : None Difficulty sitting down on and standing up from a chair with arms (e.g., wheelchair, bedside commode, etc,.)?: Total Help needed moving to and from a bed to chair (including a wheelchair)?: A Little Help needed walking in hospital room?: A Little Help needed climbing 3-5 steps with a railing? : A Lot 6 Click Score: 17    End of Session Equipment Utilized During  Treatment: Gait belt Activity Tolerance: Patient tolerated treatment well Patient left: in chair;with call bell/phone within reach;with chair alarm set Nurse Communication: Mobility status PT Visit Diagnosis: Muscle weakness (generalized) (M62.81);Difficulty in walking, not elsewhere classified (R26.2);History of falling (Z91.81)    Time: 1421-1450 PT Time Calculation (min) (ACUTE ONLY): 29 min   Charges:   PT Evaluation $PT Eval Low Complexity: 1 Procedure PT Treatments $Gait Training: 8-22 mins   PT G Codes:        Eugene Henderson, PT, DPT, NCS 11/24/16, 10:22 PM [phone]

## 2016-11-24 NOTE — Progress Notes (Signed)
Date: 11/24/2016                  Patient Name:  Tauren Elenes  MRN: [id number]  DOB: 1950/07/01  Age / Sex: 66 y.o., male         PCP: Dorcas Carrow, DO             Subjective: Patient is doing better today. His sodium level has improved to 127. He is able to eat without nausea or vomiting No edema. Breathing is better today. Urine output recorded at 5100 cc IV fluids were slowed down to 30 cc per hour   Vital Signs: Blood pressure 119/76, pulse 80, temperature 98.2 F (36.8 C), temperature source Oral, resp. rate 16, height 6' (1.829 m), weight 122.7 kg (270 lb 9.6 oz), SpO2 100 %.   Intake/Output Summary (Last 24 hours) at 11/24/16 1212 Last data filed at 11/24/16 0949  Gross per 24 hour  Intake            619.5 ml  Output             5075 ml  Net          -4455.5 ml    Weight trends: Filed Weights   11/22/16 1311 11/22/16 2030 11/23/16 1453  Weight: 121.1 kg (267 lb) 122.1 kg (269 lb 2.9 oz) 122.7 kg (270 lb 9.6 oz)    Physical Exam: General:  NAD, sitting up in bed  HEENT Anicteric, moist mucus membranes  Neck:  supple  Lungs: Clear today   Heart:: Irregular, no rub or gallop   Abdomen: Soft, NT  Extremities: No edema  Neurologic: Alert, oreinted  Skin: No acute rashes              Lab results: Basic Metabolic Panel:  Recent Labs Lab 11/22/16 1344 11/22/16 1617  11/23/16 0555  11/23/16 2008 11/24/16 0509 11/24/16 1018  NA 111*  --   < > 117*  < > 123* 127* 124*  K <2.0*  --   < > 3.2*  --  3.0*  --  2.8*  CL <65*  --   < > 71*  --  81*  --  84*  CO2 38*  --   < > 35*  --  36*  --  34*  GLUCOSE 147*  --   < > 131*  --  122*  --  137*  BUN 32*  --   < > 20  --  18  --  13  CREATININE 1.01  --   < > 0.89  --  0.82  --  0.76  CALCIUM 8.3*  --   < > 8.1*  --  8.5*  --  8.6*  MG 2.0  --   --  2.2  --   --   --   --   PHOS  --  3.0  --   --   --   --   --   --   < > = values in this interval not displayed.  Liver Function Tests:  Recent  Labs Lab 11/22/16 1344  AST 60*  ALT 48  ALKPHOS 94  BILITOT 3.6*  PROT 7.8  ALBUMIN 3.7    Recent Labs Lab 11/22/16 1344 11/23/16 0555  LIPASE 24  --   AMYLASE  --  43   No results for input(s): AMMONIA in the last 168 hours.  CBC:  Recent Labs Lab 11/22/16 1344 11/23/16 0220  WBC  6.6 5.5  HGB 15.0 15.6  HCT 43.4 44.6  MCV 79.5* 80.6  PLT 138* 127*    Cardiac Enzymes:  Recent Labs Lab 11/22/16 1344  TROPONINI 0.03*    BNP: Invalid input(s): POCBNP  CBG:  Recent Labs Lab 11/22/16 2149  GLUCAP 117*    Microbiology: Recent Results (from the past 720 hour(s))  MRSA PCR Screening     Status: None   Collection Time: 11/22/16  9:34 PM  Result Value Ref Range Status   MRSA by PCR NEGATIVE NEGATIVE Final    Comment:        The GeneXpert MRSA Assay (FDA approved for NASAL specimens only), is one component of a comprehensive MRSA colonization surveillance program. It is not intended to diagnose MRSA infection nor to guide or monitor treatment for MRSA infections.      Coagulation Studies: No results for input(s): LABPROT, INR in the last 72 hours.  Urinalysis:  Recent Labs  11/22/16 1446  COLORURINE YELLOW*  LABSPEC 1.008  PHURINE 6.0  GLUCOSEU NEGATIVE  HGBUR NEGATIVE  BILIRUBINUR NEGATIVE  KETONESUR NEGATIVE  PROTEINUR NEGATIVE  NITRITE NEGATIVE  LEUKOCYTESUR NEGATIVE        Imaging: Dg Chest 2 View  Result Date: 11/22/2016 CLINICAL DATA:  66 year old with acute onset of mid and right-sided chest pain earlier today associated with vomiting, shortness of breath and dizziness. EXAM: CHEST  2 VIEW COMPARISON:  07/28/2016, 06/03/2016, including CTA chest on both of these dates. FINDINGS: Cardiac silhouette markedly enlarged, unchanged. Thoracic aorta tortuous and atherosclerotic, unchanged. Hilar and mediastinal contours otherwise unremarkable. Lungs clear. Bronchovascular markings normal. Pulmonary vascularity normal. No visible  pleural effusions. No pneumothorax. Degenerative changes involving the lower thoracic spine. IMPRESSION: Stable cardiomegaly.  No acute cardiopulmonary disease. Electronically Signed   By: Hulan Saas M.D.   On: 11/22/2016 14:30   Dg Chest Port 1 View  Result Date: 11/23/2016 CLINICAL DATA:  Followup central line placement EXAM: PORTABLE CHEST 1 VIEW COMPARISON:  11/22/2016 FINDINGS: Left jugular central line is now seen with the tip in the proximal superior vena cava. No pneumothorax is noted. Cardiac shadow remains enlarged. The lungs are clear. No bony abnormality is noted. IMPRESSION: No pneumothorax following central line placement. Electronically Signed   By: Alcide Clever M.D.   On: 11/23/2016 07:13   Ct Renal Stone Study  Result Date: 11/22/2016 CLINICAL DATA:  Vomiting for 2 days with right-sided pain EXAM: CT ABDOMEN AND PELVIS WITHOUT CONTRAST TECHNIQUE: Multidetector CT imaging of the abdomen and pelvis was performed following the standard protocol without oral or intravenous contrast material administration. COMPARISON:  None. FINDINGS: Lower chest: There is mild right base atelectasis. Heart appears mildly enlarged. There is a small hiatal hernia. Hepatobiliary: No focal liver lesions are apparent on this noncontrast enhanced study. There is sludge in the gallbladder. Gallbladder wall does not appear appreciably thickened. There is no biliary duct dilatation. Pancreas: There is evidence of fatty replacement in portions of pancreas. No pancreatic mass or inflammatory focus. Spleen: No splenic lesions are evident. Adrenals/Urinary Tract: Adrenals appear unremarkable bilaterally. There is fetal lobulation in each kidney, an anatomic variant. There is no evident renal mass or hydronephrosis on either side. There is no renal or ureteral calculus on either side. The urinary bladder is midline with wall thickness within normal limits. Urinary bladder is mildly distended. Stomach/Bowel: There is no  appreciable bowel wall or mesenteric thickening. There is no appreciable bowel obstruction. No evident free air or portal venous air. Vascular/Lymphatic:  There is prominence of the proximal abdominal aorta with a measured transverse diameter at the level of the celiac axis of 4.5 x 4.0 cm. The aorta tapers distally. The aorta measures 3.1 x 2.8 cm near the bifurcation. 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 There is aortic atherosclerosis throughout the aorta and common iliac arteries extending into the external and internal iliac arteries. Major mesenteric vessels appear patent on this noncontrast enhanced study. There is no adenopathy in the abdomen or pelvis. Reproductive: There are multiple prostatic calculi. Prostate and seminal vesicles appear normal in size and contour. No pelvic mass evident. Other: There is mesh along the anterior abdominal wall. In the area of the mesh, there is a fairly small ventral hernia containing only fat. The appendix appears normal. There is no ascites or abscess in the abdomen or pelvis. Musculoskeletal: There is multifocal degenerative change in the lumbar spine with vacuum phenomenon at L2-3 and L3-4. There is mid lumbar dextroscoliosis. 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 There is a questionable degree of avascular necrosis in the lateral left femoral head. No blastic or lytic bone lesions are evident. There is no intramuscular or abdominal wall lesion. IMPRESSION: No bowel wall thickening or bowel obstruction.  No free air. Dilatation of the proximal abdominal aorta with a maximum diameter 4.5 x 4.0 cm. Aorta tapers distally, measuring 3.1 x 2.8 cm near the bifurcation. 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 Recommend followup by abdomen and pelvis CTA in 6 months, and vascular surgery referral/consultation if not already obtained. This recommendation follows ACR consensus guidelines: White Paper of the ACR Incidental Findings Committee II on Vascular Findings. J Am Coll Radiol 2013; 10:789-794. Multiple prostatic calculi. No renal or ureteral calculi. No hydronephrosis. Appendix appears normal.  No abscess. Small hiatal hernia. Ventral hernia containing only fat. Evidence of previous hernia repair along the anterior abdominal wall. Fairly advanced arthropathy in the left hip joint with multiple cystic areas on both sides of the joint. Question degree of underlying avascular necrosis in the lateral femoral head on the left. Electronically Signed   By: Bretta Bang III M.D.   On: 11/22/2016 17:48     Assessment & Plan: Pt is a 66 y.o. [place]  male with Asthma, CHF, HTN, hyperthyroidism, alcohol abuse, A Fib, was admitted on 11/22/2016 with Tremors, weakness and unsteady gait and found to have severe hyponatremia.   1. Hyponatremia and Hypokalemia with contraction alkalosis - baseline Na normal in Feb - Admit Na 111 --> 117 with iv NS. ->127--> 124 - Underlying cause appears to be over diuresis with lasix in setting of alcohol abuse also leading to dehydration Plan: Agree with iv NS with Kcl supplementation, rate of IV fluids was lowered because of improved oral intake May d/c lasix an use only on PRN basis even as outpatient Restart spironolactone- to help with hypokalemia Follow Na level closely Add nutritional supplements such as boost

## 2016-11-24 NOTE — Progress Notes (Signed)
Pharmacy Consult for Electrolyte Management Indication: hypokalemia  No Known Allergies  Patient Measurements: Height: 6' (182.9 cm) Weight: 270 lb 9.6 oz (122.7 kg) IBW/kg (Calculated) : 77.6   Vital Signs: Temp: 98.2 F (36.8 C) (06/20 0935) Temp Source: Oral (06/20 0935) BP: 119/76 (06/20 0935) Pulse Rate: 80 (06/20 0935) Intake/Output from previous day: 06/19 0701 - 06/20 0700 In: 1787.8 [P.O.:480; I.V.:1307.8] Out: 5125 [Urine:5125] Intake/Output from this shift: Total I/O In: -  Out: 1150 [Urine:1150]  Labs:  Recent Labs  11/22/16 1344 11/22/16 1617  11/23/16 0220 11/23/16 0555 11/23/16 2008 11/24/16 1018  WBC 6.6  --   --  5.5  --   --   --   HGB 15.0  --   --  15.6  --   --   --   HCT 43.4  --   --  44.6  --   --   --   PLT 138*  --   --  127*  --   --   --   CREATININE 1.01  --   < > 0.96 0.89 0.82 0.76  MG 2.0  --   --   --  2.2  --   --   PHOS  --  3.0  --   --   --   --   --   ALBUMIN 3.7  --   --   --   --   --   --   PROT 7.8  --   --   --   --   --   --   AST 60*  --   --   --   --   --   --   ALT 48  --   --   --   --   --   --   ALKPHOS 94  --   --   --   --   --   --   BILITOT 3.6*  --   --   --   --   --   --   < > = values in this interval not displayed. Estimated Creatinine Clearance: 124.5 mL/min (by C-G formula based on SCr of 0.76 mg/dL).   Potassium (mmol/L)  Date Value  11/24/2016 2.8 (L)   Calcium (mg/dL)  Date Value  90/21/1155 8.6 (L)   Sodium (mmol/L)  Date Value  11/24/2016 124 (L)     Assessment: 66 y/o M admitted with hypokalemia and hyponatremia. NS with 40 meq KCL running at 100 ml/hr.    Plan:  6/20 Na 124 still on IVF, K 2.8 (patient received 40 mEq po x 1 this AM for low K not repleted last night), Ca 8.6. Will give additional potassium chloride 10 mEq IV Q2H x 4 doses and recheck BMP this evening, all electrolytes tomorrow with AM labs.  Carola Frost, Pharm.D., BCPS Clinical Pharmacist 11/24/2016,12:12  PM

## 2016-11-24 NOTE — Progress Notes (Signed)
[employer] [facility]   PATIENT NAME: Eugene Henderson    MR#:  [id number]  DATE OF BIRTH:  07-29-1950  SUBJECTIVE:   Patient here due to acute hyponatremia/hypokalemia. Much improved w/ IV fluids.  No acute events overnight. Awaiting PT eval.    REVIEW OF SYSTEMS:    Review of Systems  Constitutional: Negative for chills and fever.  HENT: Negative for congestion and tinnitus.   Eyes: Negative for blurred vision and double vision.  Respiratory: Negative for cough, shortness of breath and wheezing.   Cardiovascular: Negative for chest pain, orthopnea and PND.  Gastrointestinal: Negative for abdominal pain, diarrhea, nausea and vomiting.  Genitourinary: Negative for dysuria and hematuria.  Neurological: Negative for dizziness, sensory change and focal weakness.  All other systems reviewed and are negative.   Nutrition: Heart Healthy Tolerating Diet: Yes Tolerating PT: Await Eval.   DRUG ALLERGIES:  No Known Allergies  VITALS:  Blood pressure 132/71, pulse 77, temperature 98 F (36.7 C), temperature source Oral, resp. rate 16, height 6' (1.829 m), weight 122.7 kg (270 lb 9.6 oz), SpO2 99 %.  PHYSICAL EXAMINATION:   Physical Exam  GENERAL:  66 y.o.-year-old obese patient lying in bed in no acute distress.  EYES: Pupils equal, round, reactive to light and accommodation. No scleral icterus. Extraocular muscles intact.  HEENT: Head atraumatic, normocephalic. Oropharynx and nasopharynx clear.  NECK:  Supple, no jugular venous distention. No thyroid enlargement, no tenderness.  LUNGS: Normal breath sounds bilaterally, no wheezing, rales, rhonchi. No use of accessory muscles of respiration.  CARDIOVASCULAR: S1, S2 normal. No murmurs, rubs, or gallops.  ABDOMEN: Soft, nontender, nondistended. Bowel sounds present. No organomegaly or mass.  EXTREMITIES: No cyanosis, clubbing or edema b/l.    NEUROLOGIC: Cranial nerves II through XII are intact. No focal  Motor or sensory deficits b/l.   PSYCHIATRIC: The patient is alert and oriented x 3.  SKIN: No obvious rash, lesion, or ulcer.    LABORATORY PANEL:   CBC  Recent Labs Lab 11/23/16 0220  WBC 5.5  HGB 15.6  HCT 44.6  PLT 127*   ------------------------------------------------------------------------------------------------------------------  Chemistries   Recent Labs Lab 11/22/16 1344  11/23/16 0555  11/24/16 1018  NA 111*  < > 117*  < > 124*  K <2.0*  < > 3.2*  < > 2.8*  CL <65*  < > 71*  < > 84*  CO2 38*  < > 35*  < > 34*  GLUCOSE 147*  < > 131*  < > 137*  BUN 32*  < > 20  < > 13  CREATININE 1.01  < > 0.89  < > 0.76  CALCIUM 8.3*  < > 8.1*  < > 8.6*  MG 2.0  --  2.2  --   --   AST 60*  --   --   --   --   ALT 48  --   --   --   --   ALKPHOS 94  --   --   --   --   BILITOT 3.6*  --   --   --   --   < > = values in this interval not displayed. ------------------------------------------------------------------------------------------------------------------  Cardiac Enzymes  Recent Labs Lab 11/22/16 1344  TROPONINI 0.03*   ------------------------------------------------------------------------------------------------------------------  RADIOLOGY:  Dg Chest 2 View  Result Date: 11/22/2016 CLINICAL DATA:  66 year old with acute onset of mid and right-sided chest pain earlier today associated with vomiting, shortness of breath  and dizziness. EXAM: CHEST  2 VIEW COMPARISON:  07/28/2016, 06/03/2016, including CTA chest on both of these dates. FINDINGS: Cardiac silhouette markedly enlarged, unchanged. Thoracic aorta tortuous and atherosclerotic, unchanged. Hilar and mediastinal contours otherwise unremarkable. Lungs clear. Bronchovascular markings normal. Pulmonary vascularity normal. No visible pleural effusions. No pneumothorax. Degenerative changes involving the lower thoracic spine. IMPRESSION: Stable cardiomegaly.  No acute cardiopulmonary disease. Electronically  Signed   By: Hulan Saas M.D.   On: 11/22/2016 14:30   Dg Chest Port 1 View  Result Date: 11/23/2016 CLINICAL DATA:  Followup central line placement EXAM: PORTABLE CHEST 1 VIEW COMPARISON:  11/22/2016 FINDINGS: Left jugular central line is now seen with the tip in the proximal superior vena cava. No pneumothorax is noted. Cardiac shadow remains enlarged. The lungs are clear. No bony abnormality is noted. IMPRESSION: No pneumothorax following central line placement. Electronically Signed   By: Alcide Clever M.D.   On: 11/23/2016 07:13   Ct Renal Stone Study  Result Date: 11/22/2016 CLINICAL DATA:  Vomiting for 2 days with right-sided pain EXAM: CT ABDOMEN AND PELVIS WITHOUT CONTRAST TECHNIQUE: Multidetector CT imaging of the abdomen and pelvis was performed following the standard protocol without oral or intravenous contrast material administration. COMPARISON:  None. FINDINGS: Lower chest: There is mild right base atelectasis. Heart appears mildly enlarged. There is a small hiatal hernia. Hepatobiliary: No focal liver lesions are apparent on this noncontrast enhanced study. There is sludge in the gallbladder. Gallbladder wall does not appear appreciably thickened. There is no biliary duct dilatation. Pancreas: There is evidence of fatty replacement in portions of pancreas. No pancreatic mass or inflammatory focus. Spleen: No splenic lesions are evident. Adrenals/Urinary Tract: Adrenals appear unremarkable bilaterally. There is fetal lobulation in each kidney, an anatomic variant. There is no evident renal mass or hydronephrosis on either side. There is no renal or ureteral calculus on either side. The urinary bladder is midline with wall thickness within normal limits. Urinary bladder is mildly distended. Stomach/Bowel: There is no appreciable bowel wall or mesenteric thickening. There is no appreciable bowel obstruction. No evident free air or portal venous air. Vascular/Lymphatic: There is prominence  of the proximal abdominal aorta with a measured transverse diameter at the level of the celiac axis of 4.5 x 4.0 cm. The aorta tapers distally. The aorta measures 3.1 x 2.8 cm near the bifurcation. 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 There is aortic atherosclerosis throughout the aorta and common iliac arteries extending into the external and internal iliac arteries. Major mesenteric vessels appear patent on this noncontrast enhanced study. There is no adenopathy in the abdomen or pelvis. Reproductive: There are multiple prostatic calculi. Prostate and seminal vesicles appear normal in size and contour. No pelvic mass evident. Other: There is mesh along the anterior abdominal wall. In the area of the mesh, there is a fairly small ventral hernia containing only fat. The appendix appears normal. There is no ascites or abscess in the abdomen or pelvis. Musculoskeletal: There is multifocal degenerative change in the lumbar spine with vacuum phenomenon at L2-3 and L3-4. There is mid lumbar dextroscoliosis. 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 There is a questionable degree of avascular necrosis in the lateral left femoral head. No blastic or lytic bone lesions are evident. There is no intramuscular  or abdominal wall lesion. IMPRESSION: No bowel wall thickening or bowel obstruction.  No free air. Dilatation of the proximal abdominal aorta with a maximum diameter 4.5 x 4.0 cm. Aorta tapers distally, measuring 3.1 x 2.8 cm near the bifurcation. 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 Recommend followup by abdomen and pelvis CTA in 6 months, and vascular surgery referral/consultation if not already obtained. This recommendation  follows ACR consensus guidelines: White Paper of the ACR Incidental Findings Committee II on Vascular Findings. J Am Coll Radiol 2013; 10:789-794. Multiple prostatic calculi. No renal or ureteral calculi. No hydronephrosis. Appendix appears normal.  No abscess. Small hiatal hernia. Ventral hernia containing only fat. Evidence of previous hernia repair along the anterior abdominal wall. Fairly advanced arthropathy in the left hip joint with multiple cystic areas on both sides of the joint. Question degree of underlying avascular necrosis in the lateral femoral head on the left. Electronically Signed   By: Bretta Bang III M.D.   On: 11/22/2016 17:48     ASSESSMENT AND PLAN:   66 year old male with past medical history of atrial fibrillation, hypertension, CHF, hypothyroidism, alcohol abuse who presents to the hospital due to nausea vomiting and noted to be acutely hyponatremic.  1. Hyponatremia-this is hypovolemic hyponatremia secondary to diuretic use and also patient's ongoing alcohol abuse.  - Appreciate nephrology input, continue IV fluids Sodium is improving.. -Continue to hold diuretics for now.  2. Hypokalemia-also secondary to dehydration, alcohol abuse and diuretic use. -Continue supplement. We'll repeat magnesium level. Aldactone added which should help. - appreciate Nephro input.   3. Abdominal aortic Aneurysm - patient noted to have a dilated proximal abdominal aorta and with some proximal iliac artery aneurysm. -Seen by vascular surgery yesterday and plan for follow-up as an outpatient. Repeat ultrasound in 6 months as per vascular surgery..  4. Chronic atrial fibrillation-rate controlled. Continue digoxin, metoprolol -cont. Eliquis  5. Hyperthyroidism - cont. Methimazole.   6. Hx of CHF - clinically not in CHF - cont. Metoprolol, Entresto  7. Hx of ETOH abuse - no evidence of withdrawal.  - cont. Thiamine.   Await PT eval.   All the records are reviewed and case  discussed with Care Management/Social Worker. Management plans discussed with the patient, family and they are in agreement.  CODE STATUS: Full code  DVT Prophylaxis: Eliquis   TOTAL TIME TAKING CARE OF THIS PATIENT: 25 minutes.   POSSIBLE D/C IN 1-2 DAYS, DEPENDING ON CLINICAL CONDITION.   Houston Siren M.D on 11/24/2016 at 12:40 PM  Between 7am to 6pm - Pager - [phone]  After 6pm go to [email or web address] - Scientist, research (life sciences) [facility]  Office  [phone]  CC: Primary care physician; Dorcas Carrow, DO

## 2016-11-25 LAB — BASIC METABOLIC PANEL
Anion gap: 5 (ref 5–15)
Anion gap: 6 (ref 5–15)
BUN: 12 mg/dL (ref 6–20)
BUN: 11 mg/dL (ref 6–20)
CHLORIDE: 90 mmol/L — AB (ref 101–111)
CO2: 32 mmol/L (ref 22–32)
CO2: 30 mmol/L (ref 22–32)
CREATININE: 0.65 mg/dL (ref 0.61–1.24)
Calcium: 8.8 mg/dL — ABNORMAL LOW (ref 8.9–10.3)
Calcium: 8.8 mg/dL — ABNORMAL LOW (ref 8.9–10.3)
Chloride: 91 mmol/L — ABNORMAL LOW (ref 101–111)
Creatinine, Ser: 0.86 mg/dL (ref 0.61–1.24)
GFR calc Af Amer: >60 mL/min (ref >60)
GFR calc Af Amer: >60 mL/min (ref >60)
GFR calc non Af Amer: >60 mL/min (ref >60)
GFR calc non Af Amer: >60 mL/min (ref >60)
GLUCOSE: 191 mg/dL — AB (ref 65–99)
Glucose, Bld: 109 mg/dL — ABNORMAL HIGH (ref 65–99)
POTASSIUM: 3.8 mmol/L (ref 3.5–5.1)
Potassium: 3.7 mmol/L (ref 3.5–5.1)
Sodium: 128 mmol/L — ABNORMAL LOW (ref 135–145)
Sodium: 126 mmol/L — ABNORMAL LOW (ref 135–145)

## 2016-11-25 LAB — MAGNESIUM: Magnesium: 2 mg/dL (ref 1.7–2.4)

## 2016-11-25 LAB — SODIUM: Sodium: 128 mmol/L — ABNORMAL LOW (ref 135–145)

## 2016-11-25 LAB — PHOSPHORUS: Phosphorus: 2.4 mg/dL — ABNORMAL LOW (ref 2.5–4.6)

## 2016-11-25 MED ORDER — POTASSIUM & SODIUM PHOSPHATES 280-160-250 MG PO PACK
1.0000 | PACK | Freq: Three times a day (TID) | ORAL | Status: AC
  Administered 2016-11-25 (×3): 1 via ORAL
  Filled 2016-11-25 (×4): qty 1

## 2016-11-25 NOTE — Progress Notes (Signed)
[employer] - [place] at [facility]   PATIENT NAME: Eugene Henderson    MR#:  [id number]  DATE OF BIRTH:  04-12-1951  SUBJECTIVE:  Feels weak, still requiring 2 L oxygen, heart rate jumped up to 133.  Early this morning.  Patient is sitting in a chair, sodium 128,  REVIEW OF SYSTEMS:    Review of Systems  Constitutional: Negative for chills and fever.  HENT: Negative for congestion and tinnitus.   Eyes: Negative for blurred vision and double vision.  Respiratory: Negative for cough, shortness of breath and wheezing.   Cardiovascular: Negative for chest pain, orthopnea and PND.  Gastrointestinal: Negative for abdominal pain, diarrhea, nausea and vomiting.  Genitourinary: Negative for dysuria and hematuria.  Neurological: Negative for dizziness, sensory change and focal weakness.  All other systems reviewed and are negative.   Nutrition: Heart Healthy Tolerating Diet: Yes Tolerating PT: yes, Home with home health physical therapy   DRUG ALLERGIES:  No Known Allergies  VITALS:  Blood pressure 114/80, pulse 78, temperature 97.6 F (36.4 C), temperature source Oral, resp. rate 20, height 6' (1.829 m), weight 122.7 kg (270 lb 9.6 oz), SpO2 100 %.  PHYSICAL EXAMINATION:   Physical Exam  GENERAL:  66 y.o.-year-old obese patient lying in bed in no acute distress.  EYES: Pupils equal, round, reactive to light and accommodation. No scleral icterus. Extraocular muscles intact.  HEENT: Head atraumatic, normocephalic. Oropharynx and nasopharynx clear.  NECK:  Supple, no jugular venous distention. No thyroid enlargement, no tenderness.  LUNGS: Normal breath sounds bilaterally, no wheezing, rales, rhonchi. No use of accessory muscles of respiration.  CARDIOVASCULAR: S1, S2 normal. No murmurs, rubs, or gallops.  ABDOMEN: Soft, nontender, nondistended. Bowel sounds present. No organomegaly or mass.  EXTREMITIES: No cyanosis, clubbing or edema b/l.    NEUROLOGIC: Cranial  nerves II through XII are intact. No focal Motor or sensory deficits b/l.   PSYCHIATRIC: The patient is alert and oriented x 3.  SKIN: No obvious rash, lesion, or ulcer.  LABORATORY PANEL:   CBC  Recent Labs Lab 11/23/16 0220  WBC 5.5  HGB 15.6  HCT 44.6  PLT 127*   ------------------------------------------------------------------------------------------------------------------  Chemistries   Recent Labs Lab 11/22/16 1344  11/25/16 0513  NA 111*  < > 128*  128*  K <2.0*  < > 3.7  CL <65*  < > 91*  CO2 38*  < > 32  GLUCOSE 147*  < > 109*  BUN 32*  < > 12  CREATININE 1.01  < > 0.65  CALCIUM 8.3*  < > 8.8*  MG 2.0  < > 2.0  AST 60*  --   --   ALT 48  --   --   ALKPHOS 94  --   --   BILITOT 3.6*  --   --   < > = values in this interval not displayed. ------------------------------------------------------------------------------------------------------------------  ASSESSMENT AND PLAN:  66 year old male with past medical history of atrial fibrillation, hypertension, CHF, hypothyroidism, alcohol abuse who presents to the hospital due to nausea vomiting and noted to be acutely hyponatremic.  1. Hyponatremia-this is hypovolemic hyponatremia secondary to diuretic use and also patient's ongoing alcohol abuse.  - Appreciate nephrology input, continue IV fluids Sodium is improving.. -Continue to hold diuretics for now. - Sodium 128   2. Hypokalemia - Repleted and resolved   3. Abdominal aortic Aneurysm - patient noted to have a dilated proximal abdominal aorta and with some proximal iliac artery aneurysm. -Seen by  vascular surgery and plan for follow-up as an outpatient. Repeat ultrasound in 6 months as per vascular surgery.  4. Chronic atrial fibrillation-rate controlled. Continue digoxin, metoprolol -cont. Eliquis - Heart rate went up to 133 earlier this morning at rest, we will recheck this at 78  5. Hyperthyroidism - cont. Methimazole.   6. Hx of CHF - clinically  not in CHF - cont. Metoprolol, Entresto  7. Hx of ETOH abuse - no evidence of withdrawal.  - cont. Thiamine.    He is still on 2 L oxygen - we will request, weaning oxygen.  Also check on ambulation.  Discussed with nursing  PT eval. 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  He seemed to be struggling with his stairs at home and is considering to talk with his daughter to see if there could be any adjustment in the garage or having bathroom on the main floor.  All the records are reviewed and case discussed with Care Management/Social Worker. Management plans discussed with the patient, nursing and they are in agreement.  CODE STATUS: Full code  DVT Prophylaxis: Eliquis   TOTAL TIME TAKING CARE OF THIS PATIENT: 25 minutes.   POSSIBLE D/C IN 1 DAYS, DEPENDING ON CLINICAL CONDITION.  He needs to continue work with physical therapy while in the hospital.    Delfino Lovett M.D on 11/25/2016 at 9:00 AM  Between 7am to 6pm - Pager - [phone]  After 6pm go to [email or web address] - Scientist, research (life sciences) [facility]  Office  [phone]  CC: Primary care physician; Dorcas Carrow, DO

## 2016-11-25 NOTE — Care Management Important Message (Signed)
Important Message  Patient Details  Name: Eugene Henderson MRN: [id number] Date of Birth: [birth year]   Medicare Important Message Given:  Yes    Gwenette Greet, RN 11/25/2016, 7:59 AM

## 2016-11-25 NOTE — Progress Notes (Signed)
Date: 11/25/2016                  Patient Name:  Eugene Henderson  MRN: [id number]  DOB: 07/26/1950  Age / Sex: 66 y.o., male         PCP: Dorcas Carrow, DO             Subjective: Patient is doing better today. His sodium level has improved to 128. He is able to eat without nausea or vomiting No edema. Breathing is better today. Urine output recorded at 4525 cc IV fluids continued at 30 cc per hour   Vital Signs: Blood pressure 120/79, pulse 96, temperature 98.3 F (36.8 C), temperature source Oral, resp. rate 18, height 6' (1.829 m), weight 122.7 kg (270 lb 9.6 oz), SpO2 100 %.   Intake/Output Summary (Last 24 hours) at 11/25/16 1158 Last data filed at 11/25/16 1109  Gross per 24 hour  Intake           1787.5 ml  Output             3825 ml  Net          -2037.5 ml    Weight trends: Filed Weights   11/22/16 1311 11/22/16 2030 11/23/16 1453  Weight: 121.1 kg (267 lb) 122.1 kg (269 lb 2.9 oz) 122.7 kg (270 lb 9.6 oz)    Physical Exam: General:  NAD, sitting up in chair  HEENT Anicteric, moist mucus membranes  Neck:  supple  Lungs: Clear today   Heart:: Irregular, no rub or gallop   Abdomen: Soft, NT  Extremities: No edema  Neurologic: Alert, oreinted  Skin: No acute rashes              Lab results: Basic Metabolic Panel:  Recent Labs Lab 11/22/16 1617  11/23/16 0555  11/24/16 1615 11/24/16 2324 11/25/16 0513 11/25/16 0721  NA  --   < > 117*  < > 123* 128* 128*  128* 126*  K  --   < > 3.2*  < >  --  3.6 3.7 3.8  CL  --   < > 71*  < >  --  90* 91* 90*  CO2  --   < > 35*  < >  --  35* 32 30  GLUCOSE  --   < > 131*  < >  --  132* 109* 191*  BUN  --   < > 20  < >  --  14 12 11   CREATININE  --   < > 0.89  < >  --  0.75 0.65 0.86  CALCIUM  --   < > 8.1*  < >  --  8.8* 8.8* 8.8*  MG  --   --  2.2  --  2.1  --  2.0  --   PHOS 3.0  --   --   --   --   --  2.4*  --   < > = values in this interval not displayed.  Liver Function Tests:  Recent Labs Lab  11/22/16 1344  AST 60*  ALT 48  ALKPHOS 94  BILITOT 3.6*  PROT 7.8  ALBUMIN 3.7    Recent Labs Lab 11/22/16 1344 11/23/16 0555  LIPASE 24  --   AMYLASE  --  43   No results for input(s): AMMONIA in the last 168 hours.  CBC:  Recent Labs Lab 11/22/16 1344 11/23/16 0220  WBC 6.6 5.5  HGB 15.0  15.6  HCT 43.4 44.6  MCV 79.5* 80.6  PLT 138* 127*    Cardiac Enzymes:  Recent Labs Lab 11/22/16 1344  TROPONINI 0.03*    BNP: Invalid input(s): POCBNP  CBG:  Recent Labs Lab 11/22/16 2149  GLUCAP 117*    Microbiology: Recent Results (from the past 720 hour(s))  MRSA PCR Screening     Status: None   Collection Time: 11/22/16  9:34 PM  Result Value Ref Range Status   MRSA by PCR NEGATIVE NEGATIVE Final    Comment:        The GeneXpert MRSA Assay (FDA approved for NASAL specimens only), is one component of a comprehensive MRSA colonization surveillance program. It is not intended to diagnose MRSA infection nor to guide or monitor treatment for MRSA infections.      Coagulation Studies: No results for input(s): LABPROT, INR in the last 72 hours.  Urinalysis:  Recent Labs  11/22/16 1446  COLORURINE YELLOW*  LABSPEC 1.008  PHURINE 6.0  GLUCOSEU NEGATIVE  HGBUR NEGATIVE  BILIRUBINUR NEGATIVE  KETONESUR NEGATIVE  PROTEINUR NEGATIVE  NITRITE NEGATIVE  LEUKOCYTESUR NEGATIVE        Imaging: No results found.   Assessment & Plan: Pt is a 66 y.o. [place]  male with Asthma, CHF, HTN, hyperthyroidism, alcohol abuse, A Fib, was admitted on 11/22/2016 with Tremors, weakness and unsteady gait and found to have severe hyponatremia.   1. Hyponatremia and Hypokalemia with contraction alkalosis - baseline Na normal in Feb - Admit Na 111 --> 117 with iv NS. ->127--> 124->128->126 - Underlying cause appears to be over diuresis with lasix in setting of alcohol abuse also leading to dehydration  Plan: Agree with iv NS with Kcl  supplementation, rate of IV fluids was lowered because of improved oral intake May d/c lasix an use only on PRN basis even as outpatient Restart spironolactone- to help with hypokalemia Follow Na level closely Add nutritional supplements such as boost

## 2016-11-25 NOTE — Care Management (Signed)
Admitted to [facility] with the diagnosis of hyponatremia. Lives with daughter, Toniann Fail [phone]). Last seen Olevia Perches DO about 3 weeks ago. Prescriptions are filled at [facility]. Unsure of location because his daughter picks up his medications. No home health. No skilled facility. No home oxygen. Uses a cane to aid in ambulation.  Possible fall prior to admission. Good appetite. Takes care of all basic activities of daily living himself, doesn't drive. Daughter will transport Discharge 11/26/16 per Dr. Eddie Dibbles RN MSN CCM [facility] [phone]

## 2016-11-25 NOTE — Progress Notes (Signed)
Physical Therapy Treatment Patient Details Name: Eugene Henderson MRN: [id number] DOB: 1951/02/15 Today's Date: 11/25/2016    History of Present Illness presented to ER sceondary to nausea/vomiting x3 days, chest tightness and epigastric pain; admitted with hyponatremia, hypokalemia (current Na+ 124, K+ 2.8).  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    PT Comments    Pt able to stand and ambulate 52' with min guard and recliner follow.  He was taken to stairs in gym and was able to go up 4 steps with bilateral rails and down with right rail only to simulate home environment.  While he had no LOB or buckling, his LE's started to shake.  +3 descending for safety.  Pt stated he has a full flight up to his room once returning home.  He would benefit from further stair training prior to discharge which he stated is planned for tomorrow.   Follow Up Recommendations  Home health PT     Equipment Recommendations  Rolling walker with 5" wheels    Recommendations for Other Services       Precautions / Restrictions Precautions Precautions: Fall Restrictions Weight Bearing Restrictions: No    Mobility  Bed Mobility                  Transfers Overall transfer level: Needs assistance Equipment used: Rolling walker (2 wheeled) Transfers: Sit to/from Stand Sit to Stand: Min assist         General transfer comment: Requires +1 assist for safety  Ambulation/Gait Ambulation/Gait assistance: Min guard Ambulation Distance (Feet): 70 Feet Assistive device: Rolling walker (2 wheeled)     Gait velocity interpretation: Below normal speed for age/gender General Gait Details: reciprocal stepping pattern with fair step height/length; decreased cadence with effortful gait performance. Heavy WBing bilat UEs, but no overt buckling or LOB.   Stairs Stairs: Yes   Stair Management: Two rails;One rail Left Number of Stairs:  4 General stair comments: No buckling or LOB but pt with shaking LE's upon descending stairs.  Wheelchair Mobility    Modified Rankin (Stroke Patients Only)       Balance Overall balance assessment: Needs assistance Sitting-balance support: No upper extremity supported Sitting balance-Leahy Scale: Good     Standing balance support: Bilateral upper extremity supported Standing balance-Leahy Scale: Fair                              Cognition Arousal/Alertness: Awake/alert Behavior During Therapy: WFL for tasks assessed/performed Overall Cognitive Status: Within Functional Limits for tasks assessed                                        Exercises      General Comments        Pertinent Vitals/Pain Pain Assessment: 0-10 Pain Score: 6  Pain Location: R knee Pain Descriptors / Indicators: Grimacing Pain Intervention(s): Limited activity within patient's tolerance    Home Living                      Prior Function            PT Goals (current goals can now be found in the care plan section) Progress towards PT goals: Progressing toward goals    Frequency    Min 2X/week  PT Plan Current plan remains appropriate    Co-evaluation              AM-PAC PT "6 Clicks" Daily Activity  Outcome Measure  Difficulty turning over in bed (including adjusting bedclothes, sheets and blankets)?: None Difficulty moving from lying on back to sitting on the side of the bed? : None Difficulty sitting down on and standing up from a chair with arms (e.g., wheelchair, bedside commode, etc,.)?: Total Help needed moving to and from a bed to chair (including a wheelchair)?: A Little Help needed walking in hospital room?: A Little Help needed climbing 3-5 steps with a railing? : A Lot 6 Click Score: 17    End of Session Equipment Utilized During Treatment: Gait belt Activity Tolerance: Patient tolerated treatment well Patient left:  in chair;with call bell/phone within reach         Time: 0929-0952 PT Time Calculation (min) (ACUTE ONLY): 23 min  Charges:  $Gait Training: 23-37 mins                    G Codes:       Danielle Dess, PTA 11/25/16, 10:18 AM

## 2016-11-25 NOTE — Progress Notes (Signed)
Pharmacy Consult for Electrolyte Management Indication: hypokalemia  No Known Allergies  Patient Measurements: Height: 6' (182.9 cm) Weight: 270 lb 9.6 oz (122.7 kg) IBW/kg (Calculated) : 77.6   Vital Signs: Temp: 98.6 F (37 C) (06/21 0907) Temp Source: Oral (06/21 0907) BP: 102/84 (06/21 0907) Pulse Rate: 56 (06/21 0907) Intake/Output from previous day: 06/20 0701 - 06/21 0700 In: 1537.5 [P.O.:480; I.V.:657.5; IV Piggyback:400] Out: 4525 [Urine:4525] Intake/Output from this shift: Total I/O In: -  Out: 350 [Urine:350]  Labs:  Recent Labs  11/22/16 1344 11/22/16 1617  11/23/16 0220 11/23/16 0555  11/24/16 1018 11/24/16 1615 11/24/16 2324 11/25/16 0513  WBC 6.6  --   --  5.5  --   --   --   --   --   --   HGB 15.0  --   --  15.6  --   --   --   --   --   --   HCT 43.4  --   --  44.6  --   --   --   --   --   --   PLT 138*  --   --  127*  --   --   --   --   --   --   CREATININE 1.01  --   < > 0.96 0.89  < > 0.76  --  0.75 0.65  MG 2.0  --   --   --  2.2  --   --  2.1  --  2.0  PHOS  --  3.0  --   --   --   --   --   --   --  2.4*  ALBUMIN 3.7  --   --   --   --   --   --   --   --   --   PROT 7.8  --   --   --   --   --   --   --   --   --   AST 60*  --   --   --   --   --   --   --   --   --   ALT 48  --   --   --   --   --   --   --   --   --   ALKPHOS 94  --   --   --   --   --   --   --   --   --   BILITOT 3.6*  --   --   --   --   --   --   --   --   --   < > = values in this interval not displayed. Estimated Creatinine Clearance: 124.5 mL/min (by C-G formula based on SCr of 0.65 mg/dL).   Potassium (mmol/L)  Date Value  11/25/2016 3.7   Calcium (mg/dL)  Date Value  09/62/8366 8.8 (L)   Sodium (mmol/L)  Date Value  11/25/2016 128 (L)  11/25/2016 128 (L)     Assessment: 66 y/o M admitted with hypokalemia and hyponatremia. NS with 40 meq KCL running at 100 ml/hr.    Plan:  6/20 Na 124 still on IVF, K 2.8 (patient received 40 mEq po x 1 this  AM for low K not repleted last night), Ca 8.6. Will give additional potassium chloride 10 mEq IV Q2H x 4 doses and recheck  BMP this evening, all electrolytes tomorrow with AM labs.  6/21 Na 128, K 3.7, Ca 8.8, Phos 2.4, Mg 2. Will give PhosNak 1 packet po TID x 3 doses and recheck electrolytes tomorrow with AM labs.   Carola Frost, Pharm.D., BCPS Clinical Pharmacist 11/25/2016,9:13 AM

## 2016-11-26 LAB — MAGNESIUM: MAGNESIUM: 1.9 mg/dL (ref 1.7–2.4)

## 2016-11-26 LAB — CBC
HCT: 39.7 % — ABNORMAL LOW (ref 40.0–52.0)
HEMOGLOBIN: 13.6 g/dL (ref 13.0–18.0)
MCH: 28.5 pg (ref 26.0–34.0)
MCHC: 34.2 g/dL (ref 32.0–36.0)
MCV: 83.3 fL (ref 80.0–100.0)
Platelets: 137 10*3/uL — ABNORMAL LOW (ref 150–440)
RBC: 4.77 MIL/uL (ref 4.40–5.90)
RDW: 16.5 % — ABNORMAL HIGH (ref 11.5–14.5)
WBC: 4.8 10*3/uL (ref 3.8–10.6)

## 2016-11-26 LAB — BASIC METABOLIC PANEL
Anion gap: 4 — ABNORMAL LOW (ref 5–15)
BUN: 11 mg/dL (ref 6–20)
CHLORIDE: 98 mmol/L — AB (ref 101–111)
CO2: 30 mmol/L (ref 22–32)
Calcium: 8.7 mg/dL — ABNORMAL LOW (ref 8.9–10.3)
Creatinine, Ser: 0.56 mg/dL — ABNORMAL LOW (ref 0.61–1.24)
GFR calc Af Amer: >60 mL/min (ref >60)
GFR calc non Af Amer: >60 mL/min (ref >60)
Glucose, Bld: 115 mg/dL — ABNORMAL HIGH (ref 65–99)
POTASSIUM: 3.8 mmol/L (ref 3.5–5.1)
SODIUM: 132 mmol/L — AB (ref 135–145)

## 2016-11-26 LAB — PHOSPHORUS: PHOSPHORUS: 3.1 mg/dL (ref 2.5–4.6)

## 2016-11-26 LAB — SODIUM, URINE, RANDOM: Sodium, Ur: 66 mmol/L

## 2016-11-26 LAB — CHLORIDE, URINE, RANDOM: CHLORIDE URINE: 17 mmol/L

## 2016-11-26 MED ORDER — FUROSEMIDE 40 MG PO TABS: 40.0000 mg | ORAL_TABLET | Freq: Every day | ORAL | 5 refills | Status: DC | PRN

## 2016-11-26 MED ORDER — BOOST / RESOURCE BREEZE PO LIQD: 1.0000 | Freq: Two times a day (BID) | ORAL | 2 refills | Status: DC

## 2016-11-26 NOTE — Progress Notes (Signed)
Patient discharged home per MD order. All discharge instructions given and all questions answered. Patient verbalized understanding of all discharge instructions. 

## 2016-11-26 NOTE — Discharge Instructions (Signed)
Hyponatremia Hyponatremia is when the amount of salt (sodium) in your blood is too low. When salt levels are low, your cells absorb extra water and they swell. The swelling happens throughout the body, but it mostly affects the brain. Follow these instructions at home:  Take medicines only as told by your doctor. Many medicines can make this condition worse. Talk with your doctor about any medicines that you are currently taking.  Carefully follow a recommended diet as told by your doctor.  Carefully follow instructions from your doctor about fluid restrictions.  Keep all follow-up visits as told by your doctor. This is important.  Do not drink alcohol. Contact a doctor if:  You feel sicker to your stomach (nauseous).  You feel more confused.  You feel more tired (fatigued).  Your headache gets worse.  You feel weaker.  Your symptoms go away and then they come back.  You have trouble following the diet instructions. Get help right away if:  You start to twitch and shake (have a seizure).  You pass out (faint).  You keep having watery poop (diarrhea).  You keep throwing up (vomiting). This information is not intended to replace advice given to you by your health care provider. Make sure you discuss any questions you have with your health care provider. Document Released: 02/03/2011 Document Revised: 10/30/2015 Document Reviewed: 05/20/2014 Elsevier Interactive Patient Education  2018 Elsevier Inc.  

## 2016-11-26 NOTE — Progress Notes (Signed)
PT Cancellation Note  Patient Details Name: Eugene Henderson MRN: [id number] DOB: 11/08/1950   Cancelled Treatment:    Reason Eval/Treat Not Completed: Patient declined, no reason specified.  Discussed with pt further gait and stairs training prior to discharge but pt declining.  Pt reports plan to discharge home today and pt reports no questions or concerns regarding his functional ability (including ability to perform stairs safely).  Hendricks Limes, PT 11/26/16, 9:19 AM [phone]

## 2016-11-26 NOTE — Progress Notes (Signed)
Pharmacy Consult for Electrolyte Management Indication: hypokalemia  No Known Allergies  Patient Measurements: Height: 6' (182.9 cm) Weight: 270 lb 9.6 oz (122.7 kg) IBW/kg (Calculated) : 77.6   Vital Signs: Temp: 97.6 F (36.4 C) (06/22 0540) Temp Source: Oral (06/22 0540) BP: 97/77 (06/22 0540) Pulse Rate: 57 (06/22 0540) Intake/Output from previous day: 06/21 0701 - 06/22 0700 In: 2791 [P.O.:2275; I.V.:516] Out: 6579 [Urine:3145; Stool:1] Intake/Output from this shift: No intake/output data recorded.  Labs:  Recent Labs  11/24/16 1615  11/25/16 0513 11/25/16 0721 11/26/16 0527  WBC  --   --   --   --  4.8  HGB  --   --   --   --  13.6  HCT  --   --   --   --  39.7*  PLT  --   --   --   --  137*  CREATININE  --   < > 0.65 0.86 0.56*  MG 2.1  --  2.0  --  1.9  PHOS  --   --  2.4*  --  3.1  < > = values in this interval not displayed. Estimated Creatinine Clearance: 124.5 mL/min (A) (by C-G formula based on SCr of 0.56 mg/dL (L)).   Potassium (mmol/L)  Date Value  11/26/2016 3.8   Calcium (mg/dL)  Date Value  03/83/3383 8.7 (L)   Sodium (mmol/L)  Date Value  11/26/2016 132 (L)     Assessment: 66 y/o M admitted with hypokalemia and hyponatremia. NS with 40 meq KCL running at 100 ml/hr.    Plan:  6/20 Na 124 still on IVF, K 2.8 (patient received 40 mEq po x 1 this AM for low K not repleted last night), Ca 8.6. Will give additional potassium chloride 10 mEq IV Q2H x 4 doses and recheck BMP this evening, all electrolytes tomorrow with AM labs.  6/21 Na 128, K 3.7, Ca 8.8, Phos 2.4, Mg 2. Will give PhosNak 1 packet po TID x 3 doses and recheck electrolytes tomorrow with AM labs.   6/22 Na 132, K 3.8, Ca 8.7, Phos 3.1, Mg 1.9. No further supplement required at this time. Will recheck electrolytes tomorrow with AM labs.   Carola Frost, Pharm.D., BCPS Clinical Pharmacist 11/26/2016,8:53 AM

## 2016-11-28 NOTE — Discharge Summary (Signed)
[facility] [place] [facility]   PATIENT NAME: Eugene Henderson    MR#:  [id number]  DATE OF BIRTH:  [birth year]  DATE OF ADMISSION:  11/22/2016   ADMITTING PHYSICIAN: Ihor Austin, MD  DATE OF DISCHARGE: 11/26/2016 10:30 AM  PRIMARY CARE PHYSICIAN: Olevia Perches P, DO   ADMISSION DIAGNOSIS:  Hypokalemia [E87.6] Hypochloremia [E87.8] Hyponatremia [E87.1] DISCHARGE DIAGNOSIS:  Active Problems:   Hyponatremia  SECONDARY DIAGNOSIS:   Past Medical History:  Diagnosis Date  . Arthritis   . Asthma   . CHF (congestive heart failure) (HCC)   . Hypertension   . Hyperthyroidism   . Noncompliance   . Persistent atrial fibrillation [facility])    HOSPITAL COURSE:  66 year old male with past medical history of atrial fibrillation, hypertension, CHF, hypothyroidism, alcohol abuse who presents to the hospital due to nausea vomiting and noted to be acutely hyponatremic.  1. Hyponatremia-this is hypovolemic hyponatremia secondary to diuretic use and also patient's ongoing alcohol abuse.  - Sodium 122 on the day of discharge.   2. Hypokalemia - Repleted and resolved   3. Abdominal aortic Aneurysm - patient noted to have a dilated proximal abdominal aorta and with some proximal iliac artery aneurysm. -Seen by vascular surgery and plan for follow-up as an outpatient. Repeat ultrasound in 6 months as per vascular surgery.  4. Chronic atrial fibrillation-rate controlled. Continue digoxin, metoprolol -cont. Eliquis  5. Hyperthyroidism - cont. Methimazole.   6. Hx of CHF - clinically not in CHF - cont. Metoprolol, Entresto  7. Hx of ETOH abuse - no evidence of withdrawal.  DISCHARGE CONDITIONS:  stable CONSULTS OBTAINED:  Treatment Team:  Renford Dills, MD Mosetta Pigeon, MD DRUG ALLERGIES:  No Known Allergies DISCHARGE MEDICATIONS:   Allergies as of 11/26/2016   No Known Allergies     Medication List    TAKE these medications   apixaban 5  MG Tabs tablet Commonly known as:  ELIQUIS Take 1 tablet (5 mg total) by mouth 2 (two) times daily.   digoxin 0.125 MG tablet Commonly known as:  LANOXIN Take 1 tablet (0.125 mg total) by mouth daily.   feeding supplement Liqd Take 1 Container by mouth 2 (two) times daily between meals.   furosemide 40 MG tablet Commonly known as:  LASIX Take 1 tablet (40 mg total) by mouth daily as needed. For SOB/LE edema What changed:  when to take this  reasons to take this  additional instructions   losartan 25 MG tablet Commonly known as:  COZAAR Take 12.5 mg by mouth daily.   methimazole 10 MG tablet Commonly known as:  TAPAZOLE Take 2 tablets (20 mg total) by mouth 2 (two) times daily.   metoprolol 200 MG 24 hr tablet Commonly known as:  TOPROL-XL Take 1 tablet (200 mg total) by mouth daily. Take with or immediately following a meal.   naproxen sodium 220 MG tablet Commonly known as:  ANAPROX Take 220 mg by mouth 2 (two) times daily with a meal.   propylthiouracil 50 MG tablet Commonly known as:  PTU Take 50 mg by mouth 3 (three) times daily.   sacubitril-valsartan 24-26 MG Commonly known as:  ENTRESTO Take 1 tablet by mouth 2 (two) times daily.   spironolactone 25 MG tablet Commonly known as:  ALDACTONE Take 0.5 tablets (12.5 mg total) by mouth daily.   traMADol 50 MG tablet Commonly known as:  ULTRAM Take 1 tablet (50 mg total) by mouth every 6 (six) hours as needed.  DISCHARGE INSTRUCTIONS:   DIET:  Regular diet DISCHARGE CONDITION:  Good ACTIVITY:  Activity as tolerated OXYGEN:  Home Oxygen: No.  Oxygen Delivery: room air DISCHARGE LOCATION:  home   If you experience worsening of your admission symptoms, develop shortness of breath, life threatening emergency, suicidal or homicidal thoughts you must seek medical attention immediately by calling 911 or calling your MD immediately  if symptoms less severe.  You Must read complete  instructions/literature along with all the possible adverse reactions/side effects for all the Medicines you take and that have been prescribed to you. Take any new Medicines after you have completely understood and accpet all the possible adverse reactions/side effects.   Please note  You were cared for by a hospitalist during your hospital stay. If you have any questions about your discharge medications or the care you received while you were in the hospital after you are discharged, you can call the unit and asked to speak with the hospitalist on call if the hospitalist that took care of you is not available. Once you are discharged, your primary care physician will handle any further medical issues. Please note that NO REFILLS for any discharge medications will be authorized once you are discharged, as it is imperative that you return to your primary care physician (or establish a relationship with a primary care physician if you do not have one) for your aftercare needs so that they can reassess your need for medications and monitor your lab values.    On the day of Discharge:  VITAL SIGNS:  Blood pressure 97/77, pulse 62, temperature 97.6 F (36.4 C), temperature source Oral, resp. rate 18, height 6' (1.829 m), weight 122.7 kg (270 lb 9.6 oz), SpO2 100 %. PHYSICAL EXAMINATION:  GENERAL:  66 y.o.-year-old patient lying in the bed with no acute distress.  EYES: Pupils equal, round, reactive to light and accommodation. No scleral icterus. Extraocular muscles intact.  HEENT: Head atraumatic, normocephalic. Oropharynx and nasopharynx clear.  NECK:  Supple, no jugular venous distention. No thyroid enlargement, no tenderness.  LUNGS: Normal breath sounds bilaterally, no wheezing, rales,rhonchi or crepitation. No use of accessory muscles of respiration.  CARDIOVASCULAR: S1, S2 normal. No murmurs, rubs, or gallops.  ABDOMEN: Soft, non-tender, non-distended. Bowel sounds present. No organomegaly or  mass.  EXTREMITIES: No pedal edema, cyanosis, or clubbing.  NEUROLOGIC: Cranial nerves II through XII are intact. Muscle strength 5/5 in all extremities. Sensation intact. Gait not checked.  PSYCHIATRIC: The patient is alert and oriented x 3.  SKIN: No obvious rash, lesion, or ulcer.  DATA REVIEW:   CBC  Recent Labs Lab 11/26/16 0527  WBC 4.8  HGB 13.6  HCT 39.7*  PLT 137*    Chemistries   Recent Labs Lab 11/22/16 1344  11/26/16 0527  NA 111*  < > 132*  K <2.0*  < > 3.8  CL <65*  < > 98*  CO2 38*  < > 30  GLUCOSE 147*  < > 115*  BUN 32*  < > 11  CREATININE 1.01  < > 0.56*  CALCIUM 8.3*  < > 8.7*  MG 2.0  < > 1.9  AST 60*  --   --   ALT 48  --   --   ALKPHOS 94  --   --   BILITOT 3.6*  --   --   < > = values in this interval not displayed.   Follow-up Information    Merrell, [place] P, DO. Go on 11/29/2016.  Specialty:  Family Medicine Why:  @9 :00 AM Contact information: [address] [place] [address] [phone]        Schnier, Latina Craver, MD. Go on 12/27/2016.   Specialties:  Vascular Surgery, Cardiology, Radiology, Vascular Surgery Why:  @1 :00 PM Contact information: [address] [place] [address] [phone]          Management plans discussed with the patient, family and they are in agreement.  CODE STATUS: Prior   TOTAL TIME TAKING CARE OF THIS PATIENT: 45 minutes.    Delfino Lovett M.D on 11/28/2016 at 12:11 AM  Between 7am to 6pm - Pager - [phone]  After 6pm go to [email or web address] - Social research officer, government  [facility]  Office  [phone]  CC: Primary care physician; Dorcas Carrow, DO   Note: This dictation was prepared with Dragon dictation along with smaller phrase technology. Any transcriptional errors that result from this process are unintentional.

## 2016-11-29 ENCOUNTER — Encounter: Payer: Self-pay | Admitting: Family Medicine

## 2016-11-29 ENCOUNTER — Ambulatory Visit (INDEPENDENT_AMBULATORY_CARE_PROVIDER_SITE_OTHER): Payer: Medicare Other | Admitting: Family Medicine

## 2016-11-29 VITALS — BP 122/60 | HR 93 | Temp 98.8°F | Ht 71.0 in | Wt 264.0 lb

## 2016-11-29 DIAGNOSIS — R0689 Other abnormalities of breathing: Secondary | ICD-10-CM

## 2016-11-29 DIAGNOSIS — Z9114 Patient's other noncompliance with medication regimen: Secondary | ICD-10-CM

## 2016-11-29 DIAGNOSIS — I481 Persistent atrial fibrillation: Secondary | ICD-10-CM | POA: Diagnosis not present

## 2016-11-29 DIAGNOSIS — Z720 Tobacco use: Secondary | ICD-10-CM

## 2016-11-29 DIAGNOSIS — I714 Abdominal aortic aneurysm, without rupture, unspecified: Secondary | ICD-10-CM | POA: Insufficient documentation

## 2016-11-29 DIAGNOSIS — I5022 Chronic systolic (congestive) heart failure: Secondary | ICD-10-CM | POA: Diagnosis not present

## 2016-11-29 DIAGNOSIS — G8929 Other chronic pain: Secondary | ICD-10-CM

## 2016-11-29 DIAGNOSIS — M545 Low back pain: Secondary | ICD-10-CM

## 2016-11-29 DIAGNOSIS — Z113 Encounter for screening for infections with a predominantly sexual mode of transmission: Secondary | ICD-10-CM

## 2016-11-29 DIAGNOSIS — F1010 Alcohol abuse, uncomplicated: Secondary | ICD-10-CM | POA: Insufficient documentation

## 2016-11-29 DIAGNOSIS — E871 Hypo-osmolality and hyponatremia: Secondary | ICD-10-CM | POA: Diagnosis not present

## 2016-11-29 DIAGNOSIS — R0683 Snoring: Secondary | ICD-10-CM

## 2016-11-29 DIAGNOSIS — Z13220 Encounter for screening for lipoid disorders: Secondary | ICD-10-CM

## 2016-11-29 DIAGNOSIS — Z91148 Patient's other noncompliance with medication regimen for other reason: Secondary | ICD-10-CM

## 2016-11-29 DIAGNOSIS — R350 Frequency of micturition: Secondary | ICD-10-CM

## 2016-11-29 DIAGNOSIS — I10 Essential (primary) hypertension: Secondary | ICD-10-CM

## 2016-11-29 DIAGNOSIS — E0590 Thyrotoxicosis, unspecified without thyrotoxic crisis or storm: Secondary | ICD-10-CM

## 2016-11-29 DIAGNOSIS — I4819 Other persistent atrial fibrillation: Secondary | ICD-10-CM

## 2016-11-29 NOTE — Assessment & Plan Note (Signed)
Rate controlled. On eliquis- continue to follow with Dr. Kirke Corin. Call with any concerns.

## 2016-11-29 NOTE — Progress Notes (Signed)
BP 122/60   Pulse 93   Temp 98.8 F (37.1 C)   Ht 5\' 11"  (1.803 m)   Wt 264 lb (119.7 kg)   SpO2 98%   BMI 36.82 kg/m    Subjective:    Patient ID: Eugene Henderson, male    DOB: 23-Feb-1951, 66 y.o.   MRN: [id number]  HPI: Eugene Henderson is a 66 y.o. male who presents today to establish care after being hospitalized. He had been following with the heart failure clinic, but he a history of non-compliance, primarily due to insurance issues. He has recently gotten medicaid and has been keeping his appointments.   Chief Complaint  Patient presents with  . Establish Care  . Hospitalization Follow-up   HOSPITAL FOLLOW UP- went to the ER with nausea and vomiting and found to be acutely hyponatremic, he was discharged with a sodium of only 122, this was thought to be due to diuretic use and alcohol abuse Time since discharge: 3 days Hospital/facility: [facility] Diagnosis: Hypokalemia, hypochloremia, hyponatremia, AAA (found incidentally), Demand ischemia Procedures/tests: labs, CT scan  Consultants: vascular, nephrology, critical care New medications: None  Discharge instructions: Follow up here and with vascular  Status: better   Has been on tramadol for back pain in the past- would like to continue with his tramadol  HYPERTENSION Hypertension status: controlled  Satisfied with current treatment? yes Duration of hypertension: chronic BP monitoring frequency:  not checking BP medication side effects:  no Medication compliance: good compliance Aspirin: no Recurrent headaches: no Visual changes: no Palpitations: no Dyspnea: no Chest pain: no Lower extremity edema: no Dizzy/lightheaded: no  HYPERTHYROIDISM- on methimazole. Has not been taking treatment due to financial reasons, has had issues with his thyroid for a couple of years Thyroid control status: stable Satisfied with current treatment? yes Medication side effects: no Medication compliance: excellent compliance Recent  dose adjustment:no Fatigue: no Cold intolerance: yes Heat intolerance: no Weight gain: no Weight loss: no Constipation: no Diarrhea/loose stools: no Palpitations: no Lower extremity edema: no Anxiety/depressed mood: yes   Known AAA- going to see vascular with repeat US in 6 months. No need for surgery today.  ATRIAL FIBRILLATION- rate controlled, on eliquis, follows with Dr. Kirke Corin Atrial fibrillation status: controlled Satisfied with current treatment: yes  Medication side effects:  no Medication compliance: excellent compliance Palpitations:  no Chest pain:  no Dyspnea on exertion:  yes Orthopnea:  no Syncope:  no Edema:  no Ventricular rate control: metoprolol Anti-coagulation: Eliquis   Active Ambulatory Problems    Diagnosis Date Noted  . Persistent atrial fibrillation (HCC) 07/28/2016  . Elevated troponin 07/28/2016  . Hyperthyroidism 07/28/2016  . Noncompliance with medications 07/28/2016  . Essential hypertension 07/28/2016  . Chronic systolic heart failure (HCC) 08/19/2016  . Tobacco use 08/19/2016  . Snoring 08/19/2016  . Hyponatremia 11/22/2016  . AAA (abdominal aortic aneurysm) (HCC) 11/29/2016  . Chronic low back pain 11/29/2016  . Alcohol abuse 11/29/2016   Resolved Ambulatory Problems    Diagnosis Date Noted  . Acute respiratory distress 07/28/2016  . Shortness of breath   . Acute on chronic combined systolic and diastolic CHF (congestive heart failure) (HCC)    Past Medical History:  Diagnosis Date  . Arthritis   . Asthma   . CHF (congestive heart failure) (HCC)   . Hypertension   . Hyperthyroidism   . Noncompliance   . Persistent atrial fibrillation [facility])    Past Surgical History:  Procedure Laterality Date  . JOINT REPLACEMENT Right   .  RIGHT/LEFT HEART CATH AND CORONARY ANGIOGRAPHY N/A 08/02/2016   Procedure: Right/Left Heart Cath and Coronary Angiography;  Surgeon: Iran Ouch, MD;  Location: [facility] INVASIVE CV LAB;  Service:  Cardiovascular;  Laterality: N/A;  . TESTICLE SURGERY     Patient states that he had to have the tube fixed.   Outpatient Encounter Prescriptions as of 11/29/2016  Medication Sig Note  . apixaban (ELIQUIS) 5 MG TABS tablet Take 1 tablet (5 mg total) by mouth 2 (two) times daily.   . digoxin (LANOXIN) 0.125 MG tablet Take 1 tablet (0.125 mg total) by mouth daily.   . furosemide (LASIX) 40 MG tablet Take 1 tablet (40 mg total) by mouth daily as needed. For SOB/LE edema (Patient taking differently: Take 40 mg by mouth as needed. For SOB/LE edema)   . losartan (COZAAR) 25 MG tablet Take 12.5 mg by mouth daily.   . methimazole (TAPAZOLE) 10 MG tablet Take 2 tablets (20 mg total) by mouth 2 (two) times daily.   . metoprolol (TOPROL-XL) 200 MG 24 hr tablet Take 1 tablet (200 mg total) by mouth daily. Take with or immediately following a meal.   . naproxen sodium (ANAPROX) 220 MG tablet Take 220 mg by mouth 2 (two) times daily with a meal.   . propylthiouracil (PTU) 50 MG tablet Take 50 mg by mouth 3 (three) times daily.   . sacubitril-valsartan (ENTRESTO) 24-26 MG Take 1 tablet by mouth 2 (two) times daily. 11/22/2016: Awaiting prior authorization - has not started medication.  Marland Kitchen spironolactone (ALDACTONE) 25 MG tablet Take 0.5 tablets (12.5 mg total) by mouth daily.   . feeding supplement (BOOST / RESOURCE BREEZE) LIQD Take 1 Container by mouth 2 (two) times daily between meals.   . traMADol (ULTRAM) 50 MG tablet Take 1 tablet (50 mg total) by mouth every 6 (six) hours as needed. (Patient not taking: Reported on 11/02/2016)    No facility-administered encounter medications on file as of 11/29/2016.    No Known Allergies  Social History   Social History  . Marital status: Single    Spouse name: N/A  . Number of children: N/A  . Years of education: N/A   Occupational History  . retired    Social History Main Topics  . Smoking status: Current Some Day Smoker    Packs/day: 0.25    Years: 25.00     Types: Cigarettes  . Smokeless tobacco: Never Used  . Alcohol use Yes     Comment: Socially - once a month  . Drug use: No  . Sexual activity: Not on file   Other Topics Concern  . Not on file   Social History Narrative  . No narrative on file   Family History  Problem Relation Age of Onset  . Diabetes Mother   . Diabetes Father   . Heart disease Brother   . Heart attack Brother   . Diabetes Brother   . Kidney failure Brother      Review of Systems  Constitutional: Positive for chills and diaphoresis. Negative for activity change, appetite change, fatigue, fever and unexpected weight change.  Respiratory: Negative.   Cardiovascular: Negative.   Gastrointestinal: Negative.   Endocrine: Positive for cold intolerance. Negative for heat intolerance, polydipsia, polyphagia and polyuria.  Genitourinary: Negative.   Musculoskeletal: Positive for back pain and myalgias. Negative for arthralgias, gait problem, joint swelling, neck pain and neck stiffness.  Neurological: Negative.   Psychiatric/Behavioral: Negative.     Per HPI unless specifically  indicated above     Objective:    BP 122/60   Pulse 93   Temp 98.8 F (37.1 C)   Ht 5\' 11"  (1.803 m)   Wt 264 lb (119.7 kg)   SpO2 98%   BMI 36.82 kg/m   Wt Readings from Last 3 Encounters:  11/29/16 264 lb (119.7 kg)  11/23/16 270 lb 9.6 oz (122.7 kg)  11/02/16 274 lb (124.3 kg)    Physical Exam  Constitutional: He is oriented to person, place, and time. He appears well-developed and well-nourished. No distress.  HENT:  Head: Normocephalic and atraumatic.  Right Ear: Hearing normal.  Left Ear: Hearing normal.  Nose: Nose normal.  Eyes: Conjunctivae and lids are normal. Right eye exhibits no discharge. Left eye exhibits no discharge. No scleral icterus.  Cardiovascular: Normal rate, regular rhythm, normal heart sounds and intact distal pulses.  Exam reveals no gallop and no friction rub.   No murmur  heard. Pulmonary/Chest: Effort normal. No respiratory distress. He has decreased breath sounds in the right upper field, the right middle field, the right lower field, the left upper field, the left middle field and the left lower field. He has no wheezes. He has no rales. He exhibits no tenderness.  Neurological: He is alert and oriented to person, place, and time.  Skin: Skin is warm, dry and intact. No rash noted. He is not diaphoretic. No erythema. No pallor.  Psychiatric: He has a normal mood and affect. His speech is normal and behavior is normal. Judgment and thought content normal. Cognition and memory are normal.  Nursing note and vitals reviewed.   Results for orders placed or performed during the hospital encounter of 11/22/16  MRSA PCR Screening  Result Value Ref Range   MRSA by PCR NEGATIVE NEGATIVE  CBC  Result Value Ref Range   WBC 6.6 3.8 - 10.6 K/uL   RBC 5.46 4.40 - 5.90 MIL/uL   Hemoglobin 15.0 13.0 - 18.0 g/dL   HCT 16.1 09.6 - 04.5 %   MCV 79.5 (L) 80.0 - 100.0 fL   MCH 27.5 26.0 - 34.0 pg   MCHC 34.6 32.0 - 36.0 g/dL   RDW 40.9 (H) 81.1 - 91.4 %   Platelets 138 (L) 150 - 440 K/uL  Troponin I  Result Value Ref Range   Troponin I 0.03 (HH) <0.03 ng/mL  Comprehensive metabolic panel  Result Value Ref Range   Sodium 111 (LL) 135 - 145 mmol/L   Potassium <2.0 (LL) 3.5 - 5.1 mmol/L   Chloride <65 (LL) 101 - 111 mmol/L   CO2 38 (H) 22 - 32 mmol/L   Glucose, Bld 147 (H) 65 - 99 mg/dL   BUN 32 (H) 6 - 20 mg/dL   Creatinine, Ser 7.82 0.61 - 1.24 mg/dL   Calcium 8.3 (L) 8.9 - 10.3 mg/dL   Total Protein 7.8 6.5 - 8.1 g/dL   Albumin 3.7 3.5 - 5.0 g/dL   AST 60 (H) 15 - 41 U/L   ALT 48 17 - 63 U/L   Alkaline Phosphatase 94 38 - 126 U/L   Total Bilirubin 3.6 (H) 0.3 - 1.2 mg/dL   GFR calc non Af Amer >60 >60 mL/min   GFR calc Af Amer >60 >60 mL/min   Anion gap UNABLE TO CALCULATE DUE TO NON NUMERIC VALUE 5 - 15  Magnesium  Result Value Ref Range   Magnesium 2.0  1.7 - 2.4 mg/dL  Lipase, blood  Result Value Ref Range  Lipase 24 11 - 51 U/L  Urinalysis, Complete w Microscopic  Result Value Ref Range   Color, Urine YELLOW (A) YELLOW   APPearance CLEAR (A) CLEAR   Specific Gravity, Urine 1.008 1.005 - 1.030   pH 6.0 5.0 - 8.0   Glucose, UA NEGATIVE NEGATIVE mg/dL   Hgb urine dipstick NEGATIVE NEGATIVE   Bilirubin Urine NEGATIVE NEGATIVE   Ketones, ur NEGATIVE NEGATIVE mg/dL   Protein, ur NEGATIVE NEGATIVE mg/dL   Nitrite NEGATIVE NEGATIVE   Leukocytes, UA NEGATIVE NEGATIVE   RBC / HPF NONE SEEN 0 - 5 RBC/hpf   WBC, UA 0-5 0 - 5 WBC/hpf   Bacteria, UA NONE SEEN NONE SEEN   Squamous Epithelial / LPF 0-5 (A) NONE SEEN  Urine Drug Screen, Qualitative  Result Value Ref Range   Tricyclic, Ur Screen NONE DETECTED NONE DETECTED   Amphetamines, Ur Screen NONE DETECTED NONE DETECTED   MDMA (Ecstasy)Ur Screen NONE DETECTED NONE DETECTED   Cocaine Metabolite,Ur [place] NONE DETECTED NONE DETECTED   Opiate, Ur Screen NONE DETECTED NONE DETECTED   Phencyclidine (PCP) Ur S NONE DETECTED NONE DETECTED   Cannabinoid 50 Ng, Ur [place] NONE DETECTED NONE DETECTED   Barbiturates, Ur Screen NONE DETECTED NONE DETECTED   Benzodiazepine, Ur Scrn NONE DETECTED NONE DETECTED   Methadone Scn, Ur NONE DETECTED NONE DETECTED  Brain natriuretic peptide  Result Value Ref Range   B Natriuretic Peptide 44.0 0.0 - 100.0 pg/mL  Lactic acid, plasma  Result Value Ref Range   Lactic Acid, Venous 1.3 0.5 - 1.9 mmol/L  Digoxin level  Result Value Ref Range   Digoxin Level <0.2 (L) 0.8 - 2.0 ng/mL  Phosphorus  Result Value Ref Range   Phosphorus 3.0 2.5 - 4.6 mg/dL  Basic metabolic panel  Result Value Ref Range   Sodium 117 (LL) 135 - 145 mmol/L   Potassium 2.0 (LL) 3.5 - 5.1 mmol/L   Chloride 66 (L) 101 - 111 mmol/L   CO2 40 (H) 22 - 32 mmol/L   Glucose, Bld 151 (H) 65 - 99 mg/dL   BUN 24 (H) 6 - 20 mg/dL   Creatinine, Ser 1.61 0.61 - 1.24 mg/dL   Calcium 8.5 (L) 8.9 -  10.3 mg/dL   GFR calc non Af Amer >60 >60 mL/min   GFR calc Af Amer >60 >60 mL/min   Anion gap 11 5 - 15  Basic metabolic panel  Result Value Ref Range   Sodium 117 (LL) 135 - 145 mmol/L   Potassium 3.2 (L) 3.5 - 5.1 mmol/L   Chloride 71 (L) 101 - 111 mmol/L   CO2 35 (H) 22 - 32 mmol/L   Glucose, Bld 131 (H) 65 - 99 mg/dL   BUN 20 6 - 20 mg/dL   Creatinine, Ser 0.96 0.61 - 1.24 mg/dL   Calcium 8.1 (L) 8.9 - 10.3 mg/dL   GFR calc non Af Amer >60 >60 mL/min   GFR calc Af Amer >60 >60 mL/min   Anion gap 11 5 - 15  CBC  Result Value Ref Range   WBC 5.5 3.8 - 10.6 K/uL   RBC 5.53 4.40 - 5.90 MIL/uL   Hemoglobin 15.6 13.0 - 18.0 g/dL   HCT 04.5 40.9 - 81.1 %   MCV 80.6 80.0 - 100.0 fL   MCH 28.2 26.0 - 34.0 pg   MCHC 34.9 32.0 - 36.0 g/dL   RDW 91.4 (H) 78.2 - 95.6 %   Platelets 127 (L) 150 - 440  K/uL  Basic metabolic panel  Result Value Ref Range   Sodium 117 (LL) 135 - 145 mmol/L   Potassium 2.1 (LL) 3.5 - 5.1 mmol/L   Chloride 66 (L) 101 - 111 mmol/L   CO2 39 (H) 22 - 32 mmol/L   Glucose, Bld 110 (H) 65 - 99 mg/dL   BUN 26 (H) 6 - 20 mg/dL   Creatinine, Ser 1.69 0.61 - 1.24 mg/dL   Calcium 8.3 (L) 8.9 - 10.3 mg/dL   GFR calc non Af Amer >60 >60 mL/min   GFR calc Af Amer >60 >60 mL/min   Anion gap 12 5 - 15  Glucose, capillary  Result Value Ref Range   Glucose-Capillary 117 (H) 65 - 99 mg/dL  Ethanol  Result Value Ref Range   Alcohol, Ethyl (B) <5 <5 mg/dL  Amylase  Result Value Ref Range   Amylase 43 28 - 100 U/L  Osmolality  Result Value Ref Range   Osmolality 249 (LL) 275 - 295 mOsm/kg  Osmolality, urine  Result Value Ref Range   Osmolality, Ur 324 300 - 900 mOsm/kg  Sodium, urine, random  Result Value Ref Range   Sodium, Ur 54 mmol/L  Basic metabolic panel  Result Value Ref Range   Sodium 123 (L) 135 - 145 mmol/L   Potassium 3.0 (L) 3.5 - 5.1 mmol/L   Chloride 81 (L) 101 - 111 mmol/L   CO2 36 (H) 22 - 32 mmol/L   Glucose, Bld 122 (H) 65 - 99 mg/dL    BUN 18 6 - 20 mg/dL   Creatinine, Ser 6.78 0.61 - 1.24 mg/dL   Calcium 8.5 (L) 8.9 - 10.3 mg/dL   GFR calc non Af Amer >60 >60 mL/min   GFR calc Af Amer >60 >60 mL/min   Anion gap 6 5 - 15  Magnesium  Result Value Ref Range   Magnesium 2.2 1.7 - 2.4 mg/dL  Sodium, urine, random  Result Value Ref Range   Sodium, Ur 29 mmol/L  Sodium  Result Value Ref Range   Sodium 121 (L) 135 - 145 mmol/L  Sodium  Result Value Ref Range   Sodium 120 (L) 135 - 145 mmol/L  Blood gas, arterial  Result Value Ref Range   FIO2 0.28    Delivery systems NASAL CANNULA    pH, Arterial 7.49 (H) 7.350 - 7.450   pCO2 arterial 50.0 (H) 32.0 - 48.0 mmHg   pO2, Arterial 91 83.0 - 108.0 mmHg   Bicarbonate 38.1 (H) 20.0 - 28.0 mmol/L   Acid-Base Excess 12.6 (H) 0.0 - 2.0 mmol/L   O2 Saturation 97.6 %   Patient temperature 37.0    Collection site LEFT BRACHIAL    Sample type ARTERIAL DRAW    Allens test (pass/fail) POSITIVE (A) PASS  Sodium  Result Value Ref Range   Sodium 127 (L) 135 - 145 mmol/L  Basic metabolic panel  Result Value Ref Range   Sodium 124 (L) 135 - 145 mmol/L   Potassium 2.8 (L) 3.5 - 5.1 mmol/L   Chloride 84 (L) 101 - 111 mmol/L   CO2 34 (H) 22 - 32 mmol/L   Glucose, Bld 137 (H) 65 - 99 mg/dL   BUN 13 6 - 20 mg/dL   Creatinine, Ser 9.38 0.61 - 1.24 mg/dL   Calcium 8.6 (L) 8.9 - 10.3 mg/dL   GFR calc non Af Amer >60 >60 mL/min   GFR calc Af Amer >60 >60 mL/min   Anion gap 6 5 -  15  Basic metabolic panel  Result Value Ref Range   Sodium 128 (L) 135 - 145 mmol/L   Potassium 3.6 3.5 - 5.1 mmol/L   Chloride 90 (L) 101 - 111 mmol/L   CO2 35 (H) 22 - 32 mmol/L   Glucose, Bld 132 (H) 65 - 99 mg/dL   BUN 14 6 - 20 mg/dL   Creatinine, Ser 1.61 0.61 - 1.24 mg/dL   Calcium 8.8 (L) 8.9 - 10.3 mg/dL   GFR calc non Af Amer >60 >60 mL/min   GFR calc Af Amer >60 >60 mL/min   Anion gap 3 (L) 5 - 15  Sodium  Result Value Ref Range   Sodium 123 (L) 135 - 145 mmol/L  Magnesium  Result  Value Ref Range   Magnesium 2.1 1.7 - 2.4 mg/dL  Sodium  Result Value Ref Range   Sodium 128 (L) 135 - 145 mmol/L  Basic metabolic panel  Result Value Ref Range   Sodium 128 (L) 135 - 145 mmol/L   Potassium 3.7 3.5 - 5.1 mmol/L   Chloride 91 (L) 101 - 111 mmol/L   CO2 32 22 - 32 mmol/L   Glucose, Bld 109 (H) 65 - 99 mg/dL   BUN 12 6 - 20 mg/dL   Creatinine, Ser 0.96 0.61 - 1.24 mg/dL   Calcium 8.8 (L) 8.9 - 10.3 mg/dL   GFR calc non Af Amer >60 >60 mL/min   GFR calc Af Amer >60 >60 mL/min   Anion gap 5 5 - 15  Magnesium  Result Value Ref Range   Magnesium 2.0 1.7 - 2.4 mg/dL  Phosphorus  Result Value Ref Range   Phosphorus 2.4 (L) 2.5 - 4.6 mg/dL  Basic metabolic panel  Result Value Ref Range   Sodium 126 (L) 135 - 145 mmol/L   Potassium 3.8 3.5 - 5.1 mmol/L   Chloride 90 (L) 101 - 111 mmol/L   CO2 30 22 - 32 mmol/L   Glucose, Bld 191 (H) 65 - 99 mg/dL   BUN 11 6 - 20 mg/dL   Creatinine, Ser 0.45 0.61 - 1.24 mg/dL   Calcium 8.8 (L) 8.9 - 10.3 mg/dL   GFR calc non Af Amer >60 >60 mL/min   GFR calc Af Amer >60 >60 mL/min   Anion gap 6 5 - 15  CBC  Result Value Ref Range   WBC 4.8 3.8 - 10.6 K/uL   RBC 4.77 4.40 - 5.90 MIL/uL   Hemoglobin 13.6 13.0 - 18.0 g/dL   HCT 40.9 (L) 81.1 - 91.4 %   MCV 83.3 80.0 - 100.0 fL   MCH 28.5 26.0 - 34.0 pg   MCHC 34.2 32.0 - 36.0 g/dL   RDW 78.2 (H) 95.6 - 21.3 %   Platelets 137 (L) 150 - 440 K/uL  Basic metabolic panel  Result Value Ref Range   Sodium 132 (L) 135 - 145 mmol/L   Potassium 3.8 3.5 - 5.1 mmol/L   Chloride 98 (L) 101 - 111 mmol/L   CO2 30 22 - 32 mmol/L   Glucose, Bld 115 (H) 65 - 99 mg/dL   BUN 11 6 - 20 mg/dL   Creatinine, Ser 0.86 (L) 0.61 - 1.24 mg/dL   Calcium 8.7 (L) 8.9 - 10.3 mg/dL   GFR calc non Af Amer >60 >60 mL/min   GFR calc Af Amer >60 >60 mL/min   Anion gap 4 (L) 5 - 15  Magnesium  Result Value Ref Range   Magnesium  1.9 1.7 - 2.4 mg/dL  Phosphorus  Result Value Ref Range   Phosphorus 3.1 2.5  - 4.6 mg/dL  Sodium, urine, random  Result Value Ref Range   Sodium, Ur 66 mmol/L  Chloride, urine, random  Result Value Ref Range   Chloride Urine 17 mmol/L      Assessment & Plan:   Problem List Items Addressed This Visit      Cardiovascular and Mediastinum   Chronic systolic heart failure (HCC) (Chronic)    Stable. Following with HF clinic. Due to see them in 2 days. Call with any concerns. Euvolemic today.      Relevant Orders   Comprehensive metabolic panel   Lipid Panel w/o Chol/HDL Ratio   Persistent atrial fibrillation (HCC)    Rate controlled. On eliquis- continue to follow with Dr. Kirke Corin. Call with any concerns.       Relevant Orders   Comprehensive metabolic panel   Lipid Panel w/o Chol/HDL Ratio   Essential hypertension    Under good control on recheck. Continue current regimen. Continue to monitor. Call with any concerns.       Relevant Orders   Comprehensive metabolic panel   Lipid Panel w/o Chol/HDL Ratio   Microalbumin, Urine Waived   AAA (abdominal aortic aneurysm) (HCC) - Primary    Due to see Vascular next month. Due for repeat US/CT in 6 months. Continue to monitor.      Relevant Orders   Comprehensive metabolic panel   Lipid Panel w/o Chol/HDL Ratio     Endocrine   Hyperthyroidism    Will check levels today and get him into see endocrine for evaluation. Call with any concerns.       Relevant Orders   Comprehensive metabolic panel   Thyroid Panel With TSH   Ambulatory referral to Endocrinology     Other   Tobacco use (Chronic)    Would like to quit, but not ready now. He knows we're here when he is.       Relevant Orders   CBC with Differential/Platelet   Comprehensive metabolic panel   UA/M w/rflx Culture, Routine   Noncompliance with medications    Now has insurance. Will work with him.       Snoring    Will discuss further at later appointment, may need sleep study.      Hyponatremia    Rechecking levels today. Await  results.       Relevant Orders   Comprehensive metabolic panel   Chronic low back pain    Was worked up in [place]- has not had imaging done in a long time- will discuss further next appointment.       Alcohol abuse    He knows we're here if he needs help.        Other Visit Diagnoses    Screening for STD (sexually transmitted disease)       Labs drawn today. Await results.    Relevant Orders   Hepatitis C antibody   HIV antibody   Urinary frequency       Checking labs today. Await results.    Relevant Orders   PSA   UA/M w/rflx Culture, Routine   Screening for cholesterol level       Checking labs today. Await results.    Relevant Orders   Lipid Panel w/o Chol/HDL Ratio   Decreased breath sounds       Will check spiro next visit- has had problems with breathing in the past.  Follow up plan: Return 2-3 weeks, for follow up back pain and breathing with spiro.

## 2016-11-29 NOTE — Assessment & Plan Note (Signed)
Was worked up in [place]- has not had imaging done in a long time- will discuss further next appointment.

## 2016-11-29 NOTE — Assessment & Plan Note (Signed)
Under good control on recheck. Continue current regimen. Continue to monitor. Call with any concerns.  

## 2016-11-29 NOTE — Assessment & Plan Note (Signed)
Due to see Vascular next month. Due for repeat US/CT in 6 months. Continue to monitor.

## 2016-11-29 NOTE — Patient Instructions (Addendum)
Hyperthyroidism Hyperthyroidism is when the thyroid is too active (overactive). Your thyroid is a large gland that is located in your neck. The thyroid helps to control how your body uses food (metabolism). When your thyroid is overactive, it produces too much of a hormone called thyroxine. What are the causes? Causes of hyperthyroidism may include:  Graves disease. This is when your immune system attacks the thyroid gland. This is the most common cause.  Inflammation of the thyroid gland.  Tumor in the thyroid gland or somewhere else.  Excessive use of thyroid medicines, including: ? Prescription thyroid supplement. ? Herbal supplements that mimic thyroid hormones.  Solid or fluid-filled lumps within your thyroid gland (thyroid nodules).  Excessive ingestion of iodine.  What increases the risk?  Being male.  Having a family history of thyroid conditions. What are the signs or symptoms? Signs and symptoms of hyperthyroidism may include:  Nervousness.  Inability to tolerate heat.  Unexplained weight loss.  Diarrhea.  Change in the texture of hair or skin.  Heart skipping beats or making extra beats.  Rapid heart rate.  Loss of menstruation.  Shaky hands.  Fatigue.  Restlessness.  Increased appetite.  Sleep problems.  Enlarged thyroid gland or nodules.  How is this diagnosed? Diagnosis of hyperthyroidism may include:  Medical history and physical exam.  Blood tests.  Ultrasound tests.  How is this treated? Treatment may include:  Medicines to control your thyroid.  Surgery to remove your thyroid.  Radiation therapy.  Follow these instructions at home:  Take medicines only as directed by your health care provider.  Do not use any tobacco products, including cigarettes, chewing tobacco, or electronic cigarettes. If you need help quitting, ask your health care provider.  Do not exercise or do physical activity until your health care provider  approves.  Keep all follow-up appointments as directed by your health care provider. This is important. Contact a health care provider if:  Your symptoms do not get better with treatment.  You have fever.  You are taking thyroid replacement medicine and you: ? Have depression. ? Feel mentally and physically slow. ? Have weight gain. Get help right away if:  You have decreased alertness or a change in your awareness.  You have abdominal pain.  You feel dizzy.  You have a rapid heartbeat.  You have an irregular heartbeat. This information is not intended to replace advice given to you by your health care provider. Make sure you discuss any questions you have with your health care provider. Document Released: 05/24/2005 Document Revised: 10/23/2015 Document Reviewed: 10/09/2013 Elsevier Interactive Patient Education  2017 [employer]  Abdominal Aortic Aneurysm Blood pumps away from the heart through tubes (blood vessels) called arteries. Aneurysms are weak or damaged places in the wall of an artery. It bulges out like a balloon. An abdominal aortic aneurysm happens in the main artery of the body (aorta). It can burst or tear, causing bleeding inside the body. This is an emergency. It needs treatment right away. What are the causes? The exact cause is unknown. Things that could cause this problem include:  Fat and other substances building up in the lining of a tube.  Swelling of the walls of a blood vessel.  Certain tissue diseases.  Belly (abdominal) trauma.  An infection in the main artery of the body.  What increases the risk? There are things that make it more likely for you to have an aneurysm. These include:  Being over the age of 66  years old.  Having high blood pressure (hypertension).  Being a male.  Being white.  Being very overweight (obese).  Having a family history of aneurysm.  Using tobacco products.  What are the signs or symptoms? Symptoms  depend on the size of the aneurysm and how fast it grows. There may not be symptoms. If symptoms occur, they can include:  Pain (belly, side, lower back, or groin).  Feeling full after eating a small amount of food.  Feeling sick to your stomach (nauseous), throwing up (vomiting), or both.  Feeling a lump in your belly that feels like it is beating (pulsating).  Feeling like you will pass out (faint).  How is this treated?  Medicine to control blood pressure and pain.  Imaging tests to see if the aneurysm gets bigger.  Surgery. How is this prevented? To lessen your chance of getting this condition:  Stop smoking. Stop chewing tobacco.  Limit or avoid alcohol.  Keep your blood pressure, blood sugar, and cholesterol within normal limits.  Eat less salt.  Eat foods low in saturated fats and cholesterol. These are found in animal and whole dairy products.  Eat more fiber. Fiber is found in whole grains, vegetables, and fruits.  Keep a healthy weight.  Stay active and exercise often.  This information is not intended to replace advice given to you by your health care provider. Make sure you discuss any questions you have with your health care provider. Document Released: 09/18/2012 Document Revised: 10/30/2015 Document Reviewed: 06/23/2012 Elsevier Interactive Patient Education  2017 [employer].

## 2016-11-29 NOTE — Assessment & Plan Note (Signed)
Will discuss further at later appointment, may need sleep study.

## 2016-11-29 NOTE — Assessment & Plan Note (Signed)
Now has insurance. Will work with him.

## 2016-11-29 NOTE — Assessment & Plan Note (Signed)
Will check levels today and get him into see endocrine for evaluation. Call with any concerns.

## 2016-11-29 NOTE — Assessment & Plan Note (Signed)
Rechecking levels today. Await results.  

## 2016-11-29 NOTE — Assessment & Plan Note (Signed)
Stable. Following with HF clinic. Due to see them in 2 days. Call with any concerns. Euvolemic today.

## 2016-11-29 NOTE — Assessment & Plan Note (Signed)
Would like to quit, but not ready now. He knows we're here when he is.

## 2016-11-29 NOTE — Assessment & Plan Note (Signed)
He knows we're here if he needs help.

## 2016-11-30 LAB — THYROID PANEL WITH TSH
Free Thyroxine Index: 2.5 (ref 1.2–4.9)
T3 UPTAKE RATIO: 28 % (ref 24–39)
T4 TOTAL: 9 ug/dL (ref 4.5–12.0)
TSH: 0.553 u[IU]/mL (ref 0.450–4.500)

## 2016-11-30 LAB — HIV ANTIBODY (ROUTINE TESTING W REFLEX): HIV SCREEN 4TH GENERATION: NONREACTIVE

## 2016-11-30 LAB — COMPREHENSIVE METABOLIC PANEL
ALBUMIN: 4.3 g/dL (ref 3.6–4.8)
ALT: 36 IU/L (ref 0–44)
AST: 36 IU/L (ref 0–40)
Albumin/Globulin Ratio: 1.2 (ref 1.2–2.2)
Alkaline Phosphatase: 112 IU/L (ref 39–117)
BUN / CREAT RATIO: 16 (ref 10–24)
BUN: 16 mg/dL (ref 8–27)
Bilirubin Total: 0.6 mg/dL (ref 0.0–1.2)
CO2: 29 mmol/L (ref 20–29)
CREATININE: 1 mg/dL (ref 0.76–1.27)
Calcium: 9.6 mg/dL (ref 8.6–10.2)
Chloride: 87 mmol/L — ABNORMAL LOW (ref 96–106)
GFR calc non Af Amer: 79 mL/min/{1.73_m2} (ref >59)
GFR, EST AFRICAN AMERICAN: 91 mL/min/{1.73_m2} (ref >59)
GLOBULIN, TOTAL: 3.6 g/dL (ref 1.5–4.5)
GLUCOSE: 147 mg/dL — AB (ref 65–99)
Potassium: 3.6 mmol/L (ref 3.5–5.2)
Sodium: 133 mmol/L — ABNORMAL LOW (ref 134–144)
TOTAL PROTEIN: 7.9 g/dL (ref 6.0–8.5)

## 2016-11-30 LAB — CBC WITH DIFFERENTIAL/PLATELET
BASOS ABS: 0 10*3/uL (ref 0.0–0.2)
Basos: 1 %
EOS (ABSOLUTE): 0.2 10*3/uL (ref 0.0–0.4)
EOS: 4 %
HEMATOCRIT: 44.2 % (ref 37.5–51.0)
HEMOGLOBIN: 15.2 g/dL (ref 13.0–17.7)
IMMATURE GRANS (ABS): 0 10*3/uL (ref 0.0–0.1)
Immature Granulocytes: 0 %
LYMPHS: 43 %
Lymphocytes Absolute: 1.8 10*3/uL (ref 0.7–3.1)
MCH: 27.8 pg (ref 26.6–33.0)
MCHC: 34.4 g/dL (ref 31.5–35.7)
MCV: 81 fL (ref 79–97)
MONOCYTES: 8 %
Monocytes Absolute: 0.3 10*3/uL (ref 0.1–0.9)
NEUTROS ABS: 1.8 10*3/uL (ref 1.4–7.0)
Neutrophils: 44 %
Platelets: 197 10*3/uL (ref 150–379)
RBC: 5.47 x10E6/uL (ref 4.14–5.80)
RDW: 16.3 % — ABNORMAL HIGH (ref 12.3–15.4)
WBC: 4.2 10*3/uL (ref 3.4–10.8)

## 2016-11-30 LAB — HEPATITIS C ANTIBODY: Hep C Virus Ab: >11 s/co ratio — ABNORMAL HIGH (ref 0.0–0.9)

## 2016-11-30 LAB — LIPID PANEL W/O CHOL/HDL RATIO
Cholesterol, Total: 155 mg/dL (ref 100–199)
HDL: 68 mg/dL (ref >39)
LDL CALC: 69 mg/dL (ref 0–99)
Triglycerides: 89 mg/dL (ref 0–149)
VLDL CHOLESTEROL CAL: 18 mg/dL (ref 5–40)

## 2016-11-30 LAB — PSA: Prostate Specific Ag, Serum: 1.8 ng/mL (ref 0.0–4.0)

## 2016-12-01 ENCOUNTER — Encounter: Payer: Self-pay | Admitting: Family

## 2016-12-01 ENCOUNTER — Telehealth: Payer: Self-pay | Admitting: Family

## 2016-12-01 ENCOUNTER — Ambulatory Visit: Payer: Medicare (Managed Care) | Attending: Family | Admitting: Family

## 2016-12-01 ENCOUNTER — Telehealth: Payer: Self-pay | Admitting: Family Medicine

## 2016-12-01 VITALS — BP 130/80 | HR 78 | Resp 20 | Ht 72.0 in | Wt 274.0 lb

## 2016-12-01 DIAGNOSIS — I110 Hypertensive heart disease with heart failure: Secondary | ICD-10-CM | POA: Diagnosis present

## 2016-12-01 DIAGNOSIS — M1990 Unspecified osteoarthritis, unspecified site: Secondary | ICD-10-CM | POA: Insufficient documentation

## 2016-12-01 DIAGNOSIS — B182 Chronic viral hepatitis C: Secondary | ICD-10-CM

## 2016-12-01 DIAGNOSIS — E0590 Thyrotoxicosis, unspecified without thyrotoxic crisis or storm: Secondary | ICD-10-CM | POA: Insufficient documentation

## 2016-12-01 DIAGNOSIS — Z841 Family history of disorders of kidney and ureter: Secondary | ICD-10-CM | POA: Diagnosis not present

## 2016-12-01 DIAGNOSIS — E871 Hypo-osmolality and hyponatremia: Secondary | ICD-10-CM | POA: Diagnosis not present

## 2016-12-01 DIAGNOSIS — F17210 Nicotine dependence, cigarettes, uncomplicated: Secondary | ICD-10-CM | POA: Insufficient documentation

## 2016-12-01 DIAGNOSIS — Z7901 Long term (current) use of anticoagulants: Secondary | ICD-10-CM | POA: Insufficient documentation

## 2016-12-01 DIAGNOSIS — J45909 Unspecified asthma, uncomplicated: Secondary | ICD-10-CM | POA: Diagnosis not present

## 2016-12-01 DIAGNOSIS — Z833 Family history of diabetes mellitus: Secondary | ICD-10-CM | POA: Diagnosis not present

## 2016-12-01 DIAGNOSIS — Z79899 Other long term (current) drug therapy: Secondary | ICD-10-CM | POA: Diagnosis not present

## 2016-12-01 DIAGNOSIS — I10 Essential (primary) hypertension: Secondary | ICD-10-CM

## 2016-12-01 DIAGNOSIS — I4819 Other persistent atrial fibrillation: Secondary | ICD-10-CM

## 2016-12-01 DIAGNOSIS — E876 Hypokalemia: Secondary | ICD-10-CM | POA: Diagnosis not present

## 2016-12-01 DIAGNOSIS — Z720 Tobacco use: Secondary | ICD-10-CM

## 2016-12-01 DIAGNOSIS — Z9889 Other specified postprocedural states: Secondary | ICD-10-CM | POA: Diagnosis not present

## 2016-12-01 DIAGNOSIS — I5022 Chronic systolic (congestive) heart failure: Secondary | ICD-10-CM | POA: Insufficient documentation

## 2016-12-01 DIAGNOSIS — Z8249 Family history of ischemic heart disease and other diseases of the circulatory system: Secondary | ICD-10-CM | POA: Diagnosis not present

## 2016-12-01 DIAGNOSIS — I481 Persistent atrial fibrillation: Secondary | ICD-10-CM | POA: Diagnosis not present

## 2016-12-01 LAB — BASIC METABOLIC PANEL
ANION GAP: 8 (ref 5–15)
BUN: 14 mg/dL (ref 6–20)
CHLORIDE: 92 mmol/L — AB (ref 101–111)
CO2: 31 mmol/L (ref 22–32)
CREATININE: 1 mg/dL (ref 0.61–1.24)
Calcium: 9 mg/dL (ref 8.9–10.3)
GFR calc Af Amer: >60 mL/min (ref >60)
GFR calc non Af Amer: >60 mL/min (ref >60)
Glucose, Bld: 133 mg/dL — ABNORMAL HIGH (ref 65–99)
Potassium: 2.9 mmol/L — ABNORMAL LOW (ref 3.5–5.1)
Sodium: 131 mmol/L — ABNORMAL LOW (ref 135–145)

## 2016-12-01 MED ORDER — SACUBITRIL-VALSARTAN 24-26 MG PO TABS: 1.0000 | ORAL_TABLET | Freq: Two times a day (BID) | ORAL | 5 refills | Status: DC

## 2016-12-01 NOTE — Progress Notes (Signed)
Patient ID: Nimesh Riolo, male    DOB: 1950-06-29, 66 y.o.   MRN: [id number]  HPI  Mr Puthoff is a 66 y/o male with a history of atrial fibrillation, hyperthyroidism, HTN, asthma, arthritis, current tobacco use and chronic heart failure.   Last echo was done 07/29/16 and showed an EF of 25-30% along with moderate MR and moderately increased PA pressure of 50 mm Hg. 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   Admitted 11/22/16 due to hypokalemia/hyponatremia due to nausea/vomiting. Treated and discharged after 4 days. Admitted 07/28/16 due to acute heart failure. Initially treated with IV diuretics and transitioned to oral diuretics. Elevated troponin thought to be due to demand ischemia. Also had new onset a fib and medications were started/adjusted. Discharged home after one week. Was in the ED on 06/03/16 with chest pain. Treated and released.   He presents today for his follow-up appointment with a chief complaint of mild shortness of breath with moderate exertion. He says this has been present for years with varying levels of intensity. Does have associated sleep difficulty along with this. Denies any fatigue, chest pain, edema or weight gain along with this.   Past Medical History:  Diagnosis Date  . Arthritis   . Asthma   . CHF (congestive heart failure) (HCC)   . Hypertension   . Hyperthyroidism   . Noncompliance   . Persistent atrial fibrillation [facility])    Past Surgical History:  Procedure Laterality Date  . JOINT REPLACEMENT Right   . RIGHT/LEFT HEART CATH AND CORONARY ANGIOGRAPHY N/A 08/02/2016   Procedure: Right/Left Heart Cath and Coronary Angiography;  Surgeon: Iran Ouch, MD;  Location: [facility] INVASIVE CV LAB;  Service: Cardiovascular;  Laterality: N/A;  . TESTICLE SURGERY     Patient states that he had to have the tube fixed.   Family History  Problem Relation Age of Onset  . Diabetes  Mother   . Diabetes Father   . Heart disease Brother   . Heart attack Brother   . Diabetes Brother   . Kidney failure Brother    Social History  Substance Use Topics  . Smoking status: Current Some Day Smoker    Packs/day: 0.25    Years: 25.00    Types: Cigarettes  . Smokeless tobacco: Never Used  . Alcohol use Yes     Comment: Socially - once a month   No Known Allergies  Prior to Admission medications   Medication Sig Start Date End Date Taking? Authorizing Provider  apixaban (ELIQUIS) 5 MG TABS tablet Take 1 tablet (5 mg total) by mouth 2 (two) times daily. 09/28/16  Yes Clarisa Kindred A, FNP  digoxin (LANOXIN) 0.125 MG tablet Take 1 tablet (0.125 mg total) by mouth daily. 09/28/16  Yes Jalesia Loudenslager, Inetta Fermo A, FNP  feeding supplement (BOOST / RESOURCE BREEZE) LIQD Take 1 Container by mouth 2 (two) times daily between meals. 11/26/16  Yes Delfino Lovett, MD  furosemide (LASIX) 40 MG tablet Take 1 tablet (40 mg total) by mouth daily as needed. For SOB/LE edema Patient taking differently: Take 40 mg by mouth as needed. For SOB/LE edema 11/26/16  Yes Delfino Lovett, MD  losartan (COZAAR) 25 MG tablet Take 12.5 mg by mouth daily.   Yes [provider]  methimazole (TAPAZOLE) 10 MG tablet Take 2 tablets (20 mg total) by mouth 2 (two) times daily. 09/28/16  Yes Clarisa Kindred A, FNP  metoprolol (TOPROL-XL) 200 MG  24 hr tablet Take 1 tablet (200 mg total) by mouth daily. Take with or immediately following a meal. 09/28/16  Yes Delmus Warwick, Inetta Fermo A, FNP  naproxen sodium (ANAPROX) 220 MG tablet Take 220 mg by mouth 2 (two) times daily with a meal.   Yes [provider]  propylthiouracil (PTU) 50 MG tablet Take 50 mg by mouth 3 (three) times daily.   Yes [provider]  spironolactone (ALDACTONE) 25 MG tablet Take 0.5 tablets (12.5 mg total) by mouth daily. 09/28/16  Yes Delma Freeze, FNP    Review of Systems  Constitutional: Negative for appetite change and fatigue.  HENT: Negative  for congestion, postnasal drip and sore throat.   Eyes: Negative.   Respiratory: Positive for shortness of breath. Negative for cough and chest tightness.   Cardiovascular: Negative for chest pain and palpitations.  Gastrointestinal: Negative for abdominal distention and abdominal pain.  Endocrine: Negative.   Genitourinary: Negative.   Musculoskeletal: Positive for arthralgias (left hip hurts) and back pain. Negative for neck pain.  Skin: Negative.   Allergic/Immunologic: Negative.   Neurological: Negative for dizziness, weakness and light-headedness.  Hematological: Negative for adenopathy. Does not bruise/bleed easily.  Psychiatric/Behavioral: Positive for sleep disturbance (sleep more during the day and watch tv at night). Negative for dysphoric mood. The patient is not nervous/anxious.    Vitals:   12/01/16 1020 12/01/16 1052  BP: (!) 73/55 130/80  Pulse: 78   Resp: 20   SpO2: 100%   Weight: 274 lb (124.3 kg)   Height: 6' (1.829 m)    Wt Readings from Last 3 Encounters:  12/01/16 274 lb (124.3 kg)  11/29/16 264 lb (119.7 kg)  11/23/16 270 lb 9.6 oz (122.7 kg)   Lab Results  Component Value Date   CREATININE 1.00 12/01/2016   CREATININE 1.00 11/29/2016   CREATININE 0.56 (L) 11/26/2016   Physical Exam  Constitutional: He is oriented to person, place, and time. He appears well-developed and well-nourished.  HENT:  Head: Normocephalic and atraumatic.  Neck: Normal range of motion. Neck supple. No JVD present.  Cardiovascular: Normal rate.  An irregular rhythm present.  Pulmonary/Chest: Effort normal. He has no wheezes. He has no rales.  Abdominal: Soft. He exhibits no distension. There is no tenderness.  Musculoskeletal: He exhibits no edema or tenderness.  Neurological: He is alert and oriented to person, place, and time.  Skin: Skin is warm and dry.  Psychiatric: He has a normal mood and affect. His behavior is normal. Thought content normal.  Nursing note and vitals  reviewed.  Assessment & Plan:  1: Chronic heart failure with reduced ejection fraction- - NYHA class II - euvolemic today - weight unchanged by our scale although our scale and PCP's scale from 2 days ago are quite different.  Reminded him to call for an overnight weight gain of >2 pounds or a weekly weight gain of >5 pounds - still using salt and put it on apples and watermelon recently. Instructed to not add any salt to food. Reminded him to follow a 2000mg  sodium diet.  - was put back on losartan due to insurance issues with entresto and now he has medicaid - will stop the losartan and resume entresto 24/26mg  twice daily. Reminded daughter to not give both of them and when she starts the entresto, she is to stop the losartan.  - BMP drawn today  2: HTN- - BP low with automated cuff but with manual cuff it was fine at 130/80 -  saw PCP Laural Benes) 11/29/16 and was told he is now hyperthyroid  3: Atrial fibrillation- - currently on apixaban, digoxin and max metoprolol XL  4: Tobacco use- - smoking 2-3 cigarettes daily - patient has used inhalers in the past, but unsure of which kind - complete cessation discussed for 3 minutes with him  Reviewed medication list with patient and daughter during visit.   Return in 1 month or sooner for any questions/problems before then. Will draw a BMP again since resuming entresto today.

## 2016-12-01 NOTE — Telephone Encounter (Signed)
Called patient with his results. LMOM for them to call back. Labs look good- sodium still a bit low. Will repeat next week to make sure it's coming back up-- much better than when he was in the hospital. The rest of his labs look normal, except his Hepatitis C test came back positive. We need to find out if he knew about this previously and if he had been treated previously. If he has not- he needs to come in for some additional blood work to see if it's active in his blood, and we'll get him set up for an [place] and get him in to see the liver doctor.   I am happy to talk to him if he calls back- otherwise OK to give him the above message.

## 2016-12-01 NOTE — Telephone Encounter (Signed)
LM on daughter Jerry Caras voicemail to call back regarding her dad's lab work that was drawn today (12/01/16). Sodium 131, potassium 2.9 and GFR >60.  Will need to start potassium daily and recheck labs on 12/07/16.

## 2016-12-01 NOTE — Patient Instructions (Addendum)
Continue weighing daily and call for an overnight weight gain of > 2 pounds or a weekly weight gain of >5 pounds.  Will begin entresto again. When you begin the entresto twice daily, do NOT take losartan anymore.

## 2016-12-02 ENCOUNTER — Other Ambulatory Visit: Payer: Self-pay | Admitting: Family

## 2016-12-02 ENCOUNTER — Telehealth: Payer: Self-pay | Admitting: Family

## 2016-12-02 DIAGNOSIS — E876 Hypokalemia: Secondary | ICD-10-CM

## 2016-12-02 MED ORDER — POTASSIUM CHLORIDE CRYS ER 20 MEQ PO TBCR: 20.0000 meq | EXTENDED_RELEASE_TABLET | Freq: Every day | ORAL | 3 refills | Status: DC

## 2016-12-02 NOTE — Telephone Encounter (Signed)
Spoke with daughter Toniann Fail regarding labs from yesterday (12/01/16) where his potassium was low at 2.9. Was going to begin potassium daily but we also just resumed entresto 24/26mg  twice daily.   Since he's beginning entresto again and continues to take spironolactone, will only begin potassium daily and will recheck his labs on 12/07/16.

## 2016-12-03 LAB — URINE CULTURE, REFLEX

## 2016-12-06 ENCOUNTER — Ambulatory Visit
Admission: RE | Admit: 2016-12-06 | Discharge: 2016-12-06 | Disposition: A | Discharge status: Home / Self Care | Payer: Medicare Other | Source: Ambulatory Visit | Attending: Family Medicine | Admitting: Family Medicine

## 2016-12-06 ENCOUNTER — Telehealth: Payer: Self-pay | Admitting: Family

## 2016-12-06 ENCOUNTER — Other Ambulatory Visit: Payer: Self-pay

## 2016-12-06 ENCOUNTER — Other Ambulatory Visit
Admission: RE | Admit: 2016-12-06 | Discharge: 2016-12-06 | Disposition: A | Discharge status: Home / Self Care | Payer: Medicare Other | Source: Ambulatory Visit | Attending: Family | Admitting: Family

## 2016-12-06 DIAGNOSIS — E876 Hypokalemia: Secondary | ICD-10-CM

## 2016-12-06 DIAGNOSIS — B182 Chronic viral hepatitis C: Secondary | ICD-10-CM | POA: Diagnosis not present

## 2016-12-06 DIAGNOSIS — B1920 Unspecified viral hepatitis C without hepatic coma: Secondary | ICD-10-CM | POA: Diagnosis not present

## 2016-12-06 LAB — BASIC METABOLIC PANEL
ANION GAP: 8 (ref 5–15)
BUN: 9 mg/dL (ref 6–20)
CHLORIDE: 97 mmol/L — AB (ref 101–111)
CO2: 30 mmol/L (ref 22–32)
CREATININE: 0.72 mg/dL (ref 0.61–1.24)
Calcium: 9.1 mg/dL (ref 8.9–10.3)
GFR calc non Af Amer: >60 mL/min (ref >60)
Glucose, Bld: 171 mg/dL — ABNORMAL HIGH (ref 65–99)
POTASSIUM: 3.7 mmol/L (ref 3.5–5.1)
SODIUM: 135 mmol/L (ref 135–145)

## 2016-12-06 MED ORDER — PROPYLTHIOURACIL 50 MG PO TABS: 50.0000 mg | ORAL_TABLET | Freq: Three times a day (TID) | ORAL | 1 refills | Status: DC

## 2016-12-06 NOTE — Telephone Encounter (Signed)
Called to speak with patient and his daughter answered. Daughter stated that she was the patient's POA but we do not have that documentation or a signed DPR by the patient. I explained that unfortunately, I would not be able to speak with her until we had these things on file. Patient's daughter verbalized understanding and stated that she would have the patient call when she returned home. I also asked for the daughter to bring a copy of the POA paperwork next time so we would have it on file and she stated that she would do that.

## 2016-12-06 NOTE — Telephone Encounter (Signed)
Spoke with Toniann Fail regarding her dad's lab work that was drawn today (12/06/16). Potassium is now normal at 3.7 (was 2.9). GFR remains >60.   He is to continue potassium daily along with 12.5mg  spironolactone and entresto 24/46mg .   Will recheck lab work at his next appointment on 01/03/17.

## 2016-12-06 NOTE — Telephone Encounter (Signed)
Patient returned my call. I let him know what Dr. Laural Benes said about his labs. I asked the patient if he had ever been treated for Hepatitis C and he stated that he was about 10 years ago. He stated he moved and then never got another doctor to follow up on it. I told the patient that I would let Dr. Laural Benes know this. Patient has a follow up visit next week.

## 2016-12-06 NOTE — Telephone Encounter (Signed)
Never prescribed by provider in this office. Pt just established care 11/29/16

## 2016-12-06 NOTE — Telephone Encounter (Signed)
Can we please try to get him again? He's already had his [place], but he probably doesn't know why. Thanks!

## 2016-12-06 NOTE — Telephone Encounter (Signed)
Because we don't know if it was fully treated or not- can we get him to come in this week to get the other labs done so hopefully I will have the results when I see him next week? Thanks so much!

## 2016-12-07 NOTE — Telephone Encounter (Signed)
Called patient, he was not available, will have him return my call.

## 2016-12-09 LAB — MICROSCOPIC EXAMINATION

## 2016-12-09 LAB — UA/M W/RFLX CULTURE, ROUTINE
BILIRUBIN UA: NEGATIVE
GLUCOSE, UA: NEGATIVE
KETONES UA: NEGATIVE
Nitrite, UA: NEGATIVE
Protein, UA: NEGATIVE
RBC UA: NEGATIVE
SPEC GRAV UA: 1.01 (ref 1.005–1.030)
UUROB: 0.2 mg/dL (ref 0.2–1.0)
pH, UA: 7 (ref 5.0–7.5)

## 2016-12-09 LAB — MICROALBUMIN, URINE WAIVED
Creatinine, Urine Waived: 50 mg/dL (ref 10–300)
Microalb, Ur Waived: 10 mg/L (ref 0–19)
Microalb/Creat Ratio: <30 mg/g (ref <30)

## 2016-12-14 NOTE — Telephone Encounter (Signed)
Patient will have labs drawn when he comes in for his appointment on 12/15/16.

## 2016-12-15 ENCOUNTER — Encounter: Payer: Self-pay | Admitting: Family Medicine

## 2016-12-15 ENCOUNTER — Ambulatory Visit (INDEPENDENT_AMBULATORY_CARE_PROVIDER_SITE_OTHER): Payer: Medicare Other | Admitting: Family Medicine

## 2016-12-15 ENCOUNTER — Telehealth: Payer: Self-pay | Admitting: Family Medicine

## 2016-12-15 VITALS — BP 118/64 | HR 79 | Temp 98.4°F | Wt 269.1 lb

## 2016-12-15 DIAGNOSIS — M545 Low back pain: Secondary | ICD-10-CM | POA: Diagnosis not present

## 2016-12-15 DIAGNOSIS — E871 Hypo-osmolality and hyponatremia: Secondary | ICD-10-CM

## 2016-12-15 DIAGNOSIS — R0683 Snoring: Secondary | ICD-10-CM | POA: Diagnosis not present

## 2016-12-15 DIAGNOSIS — M25561 Pain in right knee: Secondary | ICD-10-CM

## 2016-12-15 DIAGNOSIS — B182 Chronic viral hepatitis C: Secondary | ICD-10-CM | POA: Insufficient documentation

## 2016-12-15 DIAGNOSIS — G8929 Other chronic pain: Secondary | ICD-10-CM

## 2016-12-15 DIAGNOSIS — E0590 Thyrotoxicosis, unspecified without thyrotoxic crisis or storm: Secondary | ICD-10-CM

## 2016-12-15 MED ORDER — CYCLOBENZAPRINE HCL 10 MG PO TABS: 10.0000 mg | ORAL_TABLET | Freq: Every day | ORAL | 0 refills | Status: DC

## 2016-12-15 NOTE — Assessment & Plan Note (Signed)
Given trouble sleeping and cardiac issues, will check for OSA- sleep study ordered today.

## 2016-12-15 NOTE — Progress Notes (Signed)
BP 118/64   Pulse 79   Temp 98.4 F (36.9 C)   Wt 269 lb 2 oz (122.1 kg)   SpO2 98%   BMI 36.50 kg/m    Subjective:    Patient ID: Eugene Henderson, male    DOB: [birth year], 66 y.o.   MRN: [id number]  HPI: Eugene Henderson is a 66 y.o. male  Chief Complaint  Patient presents with  . Back Pain   HEPATITIS C Duration since diagnosis: 10+ years- was treated in the past, but moved and never got any follow up with the doctor and doesn't know if it was fully treated. He is not sure if he had hep A or B vaccines Hep C transmission: IV drug use Genotype: unknown Viral load:  unknown Hepatology evaluation:yes- 10+ years ago Liver biopsy:no  Cirrhosis: no- some fibrosis on [place] Antiviral therapy:yes Hepatocellular carcinoma screening: no Esophageal varices screening/EGD: no Hepatitis A Vaccine: unknown Hepatitis B Vaccine: unknown Pneumovax Vaccine: Up to Date  BACK PAIN- doing OK Duration: Chronic Mechanism of injury: unknown Location: L>R, bilateral and low back Onset: gradual Severity: moderate Quality: cramping and throbbing Frequency: waxes and wanes Radiation: L leg above the knee Aggravating factors: lifting, movement, walking, laying and bending Alleviating factors: ice, heat, laying and APAP Status: better Treatments attempted: rest, ice, heat, APAP, ibuprofen and aleve  Relief with NSAIDs?: No NSAIDs Taken Nighttime pain:  no Paresthesias / decreased sensation:  no Bowel / bladder incontinence:  no Fevers:  no Dysuria / urinary frequency:  no  KNEE PAIN Duration: chronic Involved knee: right Mechanism of injury: unknown Location:diffuse Onset: gradual Severity: severe  Quality:  dull and aching Frequency: constant Radiation: no Aggravating factors: weight bearing, walking, running and stairs  Alleviating factors: APAP and rest  Status: worse Treatments attempted: rest, ice, heat and APAP  Relief with NSAIDs?:  No NSAIDs Taken Weakness with weight  bearing or walking: yes Sensation of giving way: yes Locking: yes Popping: no Bruising: no Swelling: yes Redness: no Paresthesias/decreased sensation: no Fevers: no  SNORING- wakes up about every 45 minutes Sleep apnea status: Unknown Duration: chronic Last sleep study: Never Treatments attempted: None Wakes feeling refreshed:  yes Daytime hypersomnolence:  yes Fatigue:  yes Insomnia:  yes Good sleep hygiene:  no Difficulty falling asleep:  yes Difficulty staying asleep:  yes Snoring bothers bed partner:  no Observed apnea by bed partner: no Obesity:  yes Hypertension: yes  Pulmonary hypertension:  yes Coronary artery disease:  yes  Relevant past medical, surgical, family and social history reviewed and updated as indicated. Interim medical history since our last visit reviewed. Allergies and medications reviewed and updated.  Review of Systems  Constitutional: Positive for fatigue. Negative for activity change, appetite change, chills, diaphoresis, fever and unexpected weight change.  Respiratory: Negative.   Cardiovascular: Negative.   Musculoskeletal: Positive for arthralgias, back pain, gait problem, joint swelling and myalgias. Negative for neck pain and neck stiffness.  Neurological: Positive for weakness and numbness. Negative for dizziness, tremors, seizures, syncope, facial asymmetry, speech difficulty, light-headedness and headaches.  Psychiatric/Behavioral: Negative.     Per HPI unless specifically indicated above     Objective:    BP 118/64   Pulse 79   Temp 98.4 F (36.9 C)   Wt 269 lb 2 oz (122.1 kg)   SpO2 98%   BMI 36.50 kg/m   Wt Readings from Last 3 Encounters:  12/15/16 269 lb 2 oz (122.1 kg)  12/01/16 274 lb (124.3 kg)  11/29/16 264 lb (119.7 kg)    Physical Exam  Constitutional: He is oriented to person, place, and time. He appears well-developed and well-nourished. No distress.  HENT:  Head: Normocephalic and atraumatic.  Right Ear:  Hearing normal.  Left Ear: Hearing normal.  Nose: Nose normal.  Eyes: Conjunctivae and lids are normal. Right eye exhibits no discharge. Left eye exhibits no discharge. No scleral icterus.  Cardiovascular: Normal rate, regular rhythm, normal heart sounds and intact distal pulses.  Exam reveals no gallop and no friction rub.   No murmur heard. Pulmonary/Chest: Effort normal. No respiratory distress. He has no wheezes. He has no rales. He exhibits no tenderness.  Musculoskeletal:  + crepitus R knee, negative Mcmurrays, appleys compression and distraction and anterior and posterior drawer  Neurological: He is alert and oriented to person, place, and time.  Skin: Skin is warm, dry and intact. No rash noted. He is not diaphoretic. No erythema. No pallor.  Psychiatric: He has a normal mood and affect. His speech is normal and behavior is normal. Judgment and thought content normal. Cognition and memory are normal.  Nursing note and vitals reviewed. Back Exam:    Inspection:  Normal spinal curvature.  No deformity, ecchymosis, erythema, or lesions     Palpation:     Midline spinal tenderness: no      Paralumbar tenderness: yes L>R     Parathoracic tenderness: no      Buttocks tenderness: yesL>R     Range of Motion:      Flexion: Fingers to Knees     Extension:Decreased     Lateral bending:Decreased    Rotation:Decreased    Neuro Exam:Lower extremity DTRs normal & symmetric.  Strength and sensation intact.    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 US showed ?fibrosis and elastography not able to be done. Checking for Hep B immunity- will vaccinate as needed. Await results.       Relevant Orders   Hepatitis B Surface AntiBODY   Hepatitis B Surface AntiGEN     Endocrine   Hyperthyroidism    Not to take PTU and methimazole at the same time unless directed by endocrine. PTU cancelled. Will check on endocrine referral as patient has not heard anything.         Other   Snoring    Given trouble sleeping and cardiac issues, will check for OSA- sleep study ordered today.      Relevant Orders   Ambulatory referral to Sleep Studies   Hyponatremia  Low normal last check- will recheck today.      Relevant Orders   Basic metabolic panel   Chronic low back pain    Will obtain x-ray. Start stretches and muscle relaxer. Will continue tylenol PRN. Await results.       Relevant Medications   acetaminophen (TYLENOL) 500 MG tablet   cyclobenzaprine (FLEXERIL) 10 MG tablet   Other Relevant Orders   DG Lumbar Spine Complete    Other Visit Diagnoses    Chronic pain of right knee       Checking x-ray. Will refer to orthopedics. Call with any concerns.    Relevant Orders   DG Knee Complete 4 Views Right   Ambulatory referral to Orthopedic Surgery       Follow up plan: Return in about 4 weeks (around 01/12/2017) for Follow up back, knee and breathing with spiro.

## 2016-12-15 NOTE — Assessment & Plan Note (Signed)
Checking levels today to see if he was fully treated. US showed ?fibrosis and elastography not able to be done. Checking for Hep B immunity- will vaccinate as needed. Await results.

## 2016-12-15 NOTE — Telephone Encounter (Signed)
Patient and daughter would like medication for cyclobenzaprine to be sent to [facility] in [facility].   Please Advise.  Thank you

## 2016-12-15 NOTE — Assessment & Plan Note (Signed)
Not to take PTU and methimazole at the same time unless directed by endocrine. PTU cancelled. Will check on endocrine referral as patient has not heard anything.

## 2016-12-15 NOTE — Assessment & Plan Note (Signed)
Will obtain x-ray. Start stretches and muscle relaxer. Will continue tylenol PRN. Await results.

## 2016-12-15 NOTE — Assessment & Plan Note (Signed)
Low normal last check- will recheck today.

## 2016-12-15 NOTE — Patient Instructions (Addendum)
Sciatica Rehab  Ask your health care provider which exercises are safe for you. Do exercises exactly as told by your health care provider and adjust them as directed. It is normal to feel mild stretching, pulling, tightness, or discomfort as you do these exercises, but you should stop right away if you feel sudden pain or your pain gets worse. Do not begin these exercises until told by your health care provider.  Stretching and range of motion exercises  These exercises warm up your muscles and joints and improve the movement and flexibility of your hips and your back. These exercises also help to relieve pain, numbness, and tingling.  Exercise A: Sciatic nerve glide  1. Sit in a chair with your head facing down toward your chest. Place your hands behind your back. Let your shoulders slump forward.  2. Slowly straighten one of your knees while you tilt your head back as if you are looking toward the ceiling. Only straighten your leg as far as you can without making your symptoms worse.  3. Hold for __________ seconds.  4. Slowly return to the starting position.  5. Repeat with your other leg.  Repeat __________ times. Complete this exercise __________ times a day.  Exercise B: Knee to chest with hip adduction and internal rotation    1. Lie on your back on a firm surface with both legs straight.  2. Bend one of your knees and move it up toward your chest until you feel a gentle stretch in your lower back and buttock. Then, move your knee toward the shoulder that is on the opposite side from your leg.  ? Hold your leg in this position by holding onto the front of your knee.  3. Hold for __________ seconds.  4. Slowly return to the starting position.  5. Repeat with your other leg.  Repeat __________ times. Complete this exercise __________ times a day.  Exercise C: Prone extension on elbows    1. Lie on your abdomen on a firm surface. A bed may be too soft for this exercise.  2. Prop yourself up on your  elbows.  3. Use your arms to help lift your chest up until you feel a gentle stretch in your abdomen and your lower back.  ? This will place some of your body weight on your elbows. If this is uncomfortable, try stacking pillows under your chest.  ? Your hips should stay down, against the surface that you are lying on. Keep your hip and back muscles relaxed.  4. Hold for __________ seconds.  5. Slowly relax your upper body and return to the starting position.  Repeat __________ times. Complete this exercise __________ times a day.  Strengthening exercises  These exercises build strength and endurance in your back. Endurance is the ability to use your muscles for a long time, even after they get tired.  Exercise D: Pelvic tilt  1. Lie on your back on a firm surface. Bend your knees and keep your feet flat.  2. Tense your abdominal muscles. Tip your pelvis up toward the ceiling and flatten your lower back into the floor.  ? To help with this exercise, you may place a small towel under your lower back and try to push your back into the towel.  3. Hold for __________ seconds.  4. Let your muscles relax completely before you repeat this exercise.  Repeat __________ times. Complete this exercise __________ times a day.  Exercise E: Alternating arm and leg raises      1. Get on your hands and knees on a firm surface. If you are on a hard floor, you may want to use padding to cushion your knees, such as an exercise mat.  2. Line up your arms and legs. Your hands should be below your shoulders, and your knees should be below your hips.  3. Lift your left leg behind you. At the same time, raise your right arm and straighten it in front of you.  ? Do not lift your leg higher than your hip.  ? Do not lift your arm higher than your shoulder.  ? Keep your abdominal and back muscles tight.  ? Keep your hips facing the ground.  ? Do not arch your back.  ? Keep your balance carefully, and do not hold your breath.  4. Hold for  __________ seconds.  5. Slowly return to the starting position and repeat with your right leg and your left arm.  Repeat __________ times. Complete this exercise __________ times a day.  Posture and body mechanics    Body mechanics refers to the movements and positions of your body while you do your daily activities. Posture is part of body mechanics. Good posture and healthy body mechanics can help to relieve stress in your body's tissues and joints. Good posture means that your spine is in its natural S-curve position (your spine is neutral), your shoulders are pulled back slightly, and your head is not tipped forward. The following are general guidelines for applying improved posture and body mechanics to your everyday activities.  Standing    · When standing, keep your spine neutral and your feet about hip-width apart. Keep a slight bend in your knees. Your ears, shoulders, and hips should line up.  · When you do a task in which you stand in one place for a long time, place one foot up on a stable object that is 2-4 inches (5-10 cm) high, such as a footstool. This helps keep your spine neutral.  Sitting    · When sitting, keep your spine neutral and keep your feet flat on the floor. Use a footrest, if necessary, and keep your thighs parallel to the floor. Avoid rounding your shoulders, and avoid tilting your head forward.  · When working at a desk or a computer, keep your desk at a height where your hands are slightly lower than your elbows. Slide your chair under your desk so you are close enough to maintain good posture.  · When working at a computer, place your monitor at a height where you are looking straight ahead and you do not have to tilt your head forward or downward to look at the screen.  Resting    · When lying down and resting, avoid positions that are most painful for you.  · If you have pain with activities such as sitting, bending, stooping, or squatting (flexion-based activities), lie in a  position in which your body does not bend very much. For example, avoid curling up on your side with your arms and knees near your chest (fetal position).  · If you have pain with activities such as standing for a long time or reaching with your arms (extension-based activities), lie with your spine in a neutral position and bend your knees slightly. Try the following positions:  ? Lying on your side with a pillow between your knees.  ? Lying on your back with a pillow under your knees.  Lifting    · When lifting   objects, keep your feet at least shoulder-width apart and tighten your abdominal muscles.  · Bend your knees and hips and keep your spine neutral. It is important to lift using the strength of your legs, not your back. Do not lock your knees straight out.  · Always ask for help to lift heavy or awkward objects.  This information is not intended to replace advice given to you by your health care provider. Make sure you discuss any questions you have with your health care provider.  Document Released: 05/24/2005 Document Revised: 01/29/2016 Document Reviewed: 02/07/2015  Elsevier Interactive Patient Education © 2018 [employer]

## 2016-12-17 LAB — SPECIMEN STATUS REPORT

## 2016-12-17 NOTE — Progress Notes (Signed)
referral

## 2016-12-21 ENCOUNTER — Encounter: Payer: Self-pay | Admitting: Neurology

## 2016-12-22 ENCOUNTER — Institutional Professional Consult (permissible substitution): Payer: Medicaid Other | Admitting: Neurology

## 2016-12-23 ENCOUNTER — Telehealth: Payer: Self-pay | Admitting: Family Medicine

## 2016-12-23 DIAGNOSIS — B182 Chronic viral hepatitis C: Secondary | ICD-10-CM

## 2016-12-23 NOTE — Telephone Encounter (Signed)
Called and left a message asking patient to return my call.  

## 2016-12-23 NOTE — Telephone Encounter (Signed)
Please let him know that his Hep C has not been fully treated, so we are going to get him in to see the liver doctor. Referral generated today.

## 2016-12-24 NOTE — Telephone Encounter (Signed)
Patient notified

## 2016-12-27 ENCOUNTER — Encounter (INDEPENDENT_AMBULATORY_CARE_PROVIDER_SITE_OTHER): Payer: Medicaid Other | Admitting: Vascular Surgery

## 2016-12-29 ENCOUNTER — Telehealth: Payer: Self-pay | Admitting: Family Medicine

## 2016-12-29 NOTE — Telephone Encounter (Signed)
error 

## 2016-12-30 LAB — HCV RNA QUANT
HCV log10: 6.97 log10 IU/mL
HEPATITIS C QUANTITATION: 9330000 [IU]/mL

## 2016-12-30 LAB — HEPATITIS C GENOTYPE

## 2016-12-30 LAB — BASIC METABOLIC PANEL
BUN / CREAT RATIO: 11 (ref 10–24)
BUN: 10 mg/dL (ref 8–27)
CALCIUM: 9.4 mg/dL (ref 8.6–10.2)
CO2: 23 mmol/L (ref 20–29)
CREATININE: 0.93 mg/dL (ref 0.76–1.27)
Chloride: 99 mmol/L (ref 96–106)
GFR calc non Af Amer: 86 mL/min/{1.73_m2} (ref >59)
GFR, EST AFRICAN AMERICAN: 99 mL/min/{1.73_m2} (ref >59)
GLUCOSE: 146 mg/dL — AB (ref 65–99)
Potassium: 4.2 mmol/L (ref 3.5–5.2)
Sodium: 135 mmol/L (ref 134–144)

## 2016-12-30 LAB — HEPATITIS B SURFACE ANTIGEN: Hepatitis B Surface Ag: NEGATIVE

## 2016-12-30 LAB — HEPATITIS B SURFACE ANTIBODY,QUALITATIVE: Hep B Surface Ab, Qual: REACTIVE

## 2016-12-31 ENCOUNTER — Other Ambulatory Visit: Payer: Medicare Other

## 2017-01-03 ENCOUNTER — Encounter: Payer: Self-pay | Admitting: Family

## 2017-01-03 ENCOUNTER — Ambulatory Visit: Payer: Medicare Other | Attending: Family | Admitting: Family

## 2017-01-03 VITALS — BP 153/97 | HR 92 | Resp 18 | Ht 72.0 in | Wt 273.0 lb

## 2017-01-03 DIAGNOSIS — F17210 Nicotine dependence, cigarettes, uncomplicated: Secondary | ICD-10-CM | POA: Diagnosis not present

## 2017-01-03 DIAGNOSIS — Z7901 Long term (current) use of anticoagulants: Secondary | ICD-10-CM | POA: Diagnosis not present

## 2017-01-03 DIAGNOSIS — I110 Hypertensive heart disease with heart failure: Secondary | ICD-10-CM | POA: Insufficient documentation

## 2017-01-03 DIAGNOSIS — J45909 Unspecified asthma, uncomplicated: Secondary | ICD-10-CM | POA: Diagnosis not present

## 2017-01-03 DIAGNOSIS — Z79899 Other long term (current) drug therapy: Secondary | ICD-10-CM | POA: Insufficient documentation

## 2017-01-03 DIAGNOSIS — I10 Essential (primary) hypertension: Secondary | ICD-10-CM

## 2017-01-03 DIAGNOSIS — I509 Heart failure, unspecified: Secondary | ICD-10-CM | POA: Diagnosis present

## 2017-01-03 DIAGNOSIS — I4819 Other persistent atrial fibrillation: Secondary | ICD-10-CM

## 2017-01-03 DIAGNOSIS — Z841 Family history of disorders of kidney and ureter: Secondary | ICD-10-CM | POA: Diagnosis not present

## 2017-01-03 DIAGNOSIS — I481 Persistent atrial fibrillation: Secondary | ICD-10-CM | POA: Diagnosis not present

## 2017-01-03 DIAGNOSIS — M1990 Unspecified osteoarthritis, unspecified site: Secondary | ICD-10-CM | POA: Diagnosis not present

## 2017-01-03 DIAGNOSIS — Z833 Family history of diabetes mellitus: Secondary | ICD-10-CM | POA: Diagnosis not present

## 2017-01-03 DIAGNOSIS — Z8249 Family history of ischemic heart disease and other diseases of the circulatory system: Secondary | ICD-10-CM | POA: Insufficient documentation

## 2017-01-03 DIAGNOSIS — I5022 Chronic systolic (congestive) heart failure: Secondary | ICD-10-CM | POA: Diagnosis not present

## 2017-01-03 DIAGNOSIS — E0590 Thyrotoxicosis, unspecified without thyrotoxic crisis or storm: Secondary | ICD-10-CM | POA: Insufficient documentation

## 2017-01-03 DIAGNOSIS — Z720 Tobacco use: Secondary | ICD-10-CM

## 2017-01-03 NOTE — Progress Notes (Signed)
Patient ID: Eugene Henderson, male    DOB: December 15, 1950, 66 y.o.   MRN: [id number]  HPI  Eugene Henderson is a 66 y/o male with a history of atrial fibrillation, hyperthyroidism, HTN, asthma, arthritis, current tobacco use and chronic heart failure.   Last echo was done 07/29/16 and showed an EF of 25-30% along with moderate Eugene and moderately increased PA pressure of 50 mm Hg. 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   Admitted 11/22/16 due to hypokalemia/hyponatremia due to nausea/vomiting. Treated and discharged after 4 days. Admitted 07/28/16 due to acute heart failure. Initially treated with IV diuretics and transitioned to oral diuretics. Elevated troponin thought to be due to demand ischemia. Also had new onset a fib and medications were started/adjusted. Discharged home after one week. Was in the ED on 06/03/16 with chest pain. Treated and released.   He presents today for his follow-up appointment with a chief complaint of mild shortness of breath with moderate exertion. He describes this as chronic in nature and is worse when he has to walk up steps. Says that this has been present for years with varying levels of severity. He has associated difficulty sleeping along with this. Denies any fatigue, chest pain, cough, edema or palpitations. Unsure of weight gain.   Past Medical History:  Diagnosis Date  . Arthritis   . Asthma   . CHF (congestive heart failure) (HCC)   . Hypertension   . Hyperthyroidism   . Noncompliance   . Persistent atrial fibrillation [facility])    Past Surgical History:  Procedure Laterality Date  . JOINT REPLACEMENT Right   . RIGHT/LEFT HEART CATH AND CORONARY ANGIOGRAPHY N/A 08/02/2016   Procedure: Right/Left Heart Cath and Coronary Angiography;  Surgeon: Iran Ouch, MD;  Location: [facility] INVASIVE CV LAB;  Service: Cardiovascular;  Laterality: N/A;  . TESTICLE SURGERY     Patient states  that he had to have the tube fixed.   Family History  Problem Relation Age of Onset  . Diabetes Mother   . Diabetes Father   . Heart disease Brother   . Heart attack Brother   . Diabetes Brother   . Kidney failure Brother    Social History  Substance Use Topics  . Smoking status: Current Some Day Smoker    Packs/day: 0.25    Years: 25.00    Types: Cigarettes  . Smokeless tobacco: Never Used  . Alcohol use Yes     Comment: Socially - once a month   No Known Allergies  Prior to Admission medications   Medication Sig Start Date End Date Taking? Authorizing Provider  apixaban (ELIQUIS) 5 MG TABS tablet Take 1 tablet (5 mg total) by mouth 2 (two) times daily. 09/28/16  Yes Clarisa Kindred A, FNP  digoxin (LANOXIN) 0.125 MG tablet Take 1 tablet (0.125 mg total) by mouth daily. 09/28/16  Yes Gehrig Patras, Inetta Fermo A, FNP  feeding supplement (BOOST / RESOURCE BREEZE) LIQD Take 1 Container by mouth 2 (two) times daily between meals. 11/26/16  Yes Delfino Lovett, MD  furosemide (LASIX) 40 MG tablet Take 1 tablet (40 mg total) by mouth daily as needed. For SOB/LE edema Patient taking differently: Take 40 mg by mouth as needed. For SOB/LE edema 11/26/16  Yes Delfino Lovett, MD  losartan (COZAAR) 25 MG tablet Take 12.5 mg by mouth daily.   Yes [provider]  methimazole (TAPAZOLE) 10 MG tablet Take 2 tablets (20 mg total)  by mouth 2 (two) times daily. 09/28/16  Yes Clarisa Kindred A, FNP  metoprolol (TOPROL-XL) 200 MG 24 hr tablet Take 1 tablet (200 mg total) by mouth daily. Take with or immediately following a meal. 09/28/16  Yes Sharnette Kitamura, Inetta Fermo A, FNP  naproxen sodium (ANAPROX) 220 MG tablet Take 220 mg by mouth 2 (two) times daily with a meal.   Yes [provider]  propylthiouracil (PTU) 50 MG tablet Take 50 mg by mouth 3 (three) times daily.   Yes [provider]  spironolactone (ALDACTONE) 25 MG tablet Take 0.5 tablets (12.5 mg total) by mouth daily. 09/28/16  Yes Delma Freeze, FNP     Review of Systems  Constitutional: Negative for appetite change and fatigue.  HENT: Negative for congestion, postnasal drip and sore throat.   Eyes: Negative.   Respiratory: Positive for shortness of breath (when getting up steps). Negative for cough, chest tightness and wheezing.   Cardiovascular: Negative for chest pain, palpitations and leg swelling.  Gastrointestinal: Negative for abdominal distention and abdominal pain.  Endocrine: Negative.   Genitourinary: Negative.   Musculoskeletal: Positive for back pain. Negative for arthralgias.  Skin: Negative.   Allergic/Immunologic: Negative.   Neurological: Negative for dizziness, weakness and light-headedness.  Hematological: Negative for adenopathy. Does not bruise/bleed easily.  Psychiatric/Behavioral: Positive for sleep disturbance (sleep more during the day and watch tv at night). Negative for dysphoric mood. The patient is not nervous/anxious.    Vitals:   01/03/17 1048  BP: (!) 153/97  Pulse: 92  Resp: 18  SpO2: 100%  Weight: 273 lb (123.8 kg)  Height: 6' (1.829 m)   Wt Readings from Last 3 Encounters:  01/03/17 273 lb (123.8 kg)  12/15/16 269 lb 2 oz (122.1 kg)  12/01/16 274 lb (124.3 kg)   Lab Results  Component Value Date   CREATININE 0.93 12/15/2016   CREATININE 0.72 12/06/2016   CREATININE 1.00 12/01/2016   Physical Exam  Constitutional: He is oriented to person, place, and time. He appears well-developed and well-nourished.  HENT:  Head: Normocephalic and atraumatic.  Neck: Normal range of motion. Neck supple. No JVD present.  Cardiovascular: Normal rate.  An irregular rhythm present.  Pulmonary/Chest: Effort normal. He has no wheezes. He has no rales.  Abdominal: Soft. He exhibits no distension. There is no tenderness.  Musculoskeletal: He exhibits no edema or tenderness.  Neurological: He is alert and oriented to person, place, and time.  Skin: Skin is warm and dry.  Psychiatric: He has a normal mood  and affect. His behavior is normal. Thought content normal.  Nursing note and vitals reviewed.  Assessment & Plan:  1: Chronic heart failure with reduced ejection fraction- - NYHA class II - euvolemic today - weight unchanged by our scale; admits to not weighing daily at home and he was reminded him to call for an overnight weight gain of >2 pounds or a weekly weight gain of >5 pounds - still using salt on some foods;  Instructed to not add any salt to food. Reminded him to follow a 2000mg  sodium diet.  - BMP from 12/15/16 reviewed which shows sodium 135, potassium 4.2 and GFR 99 - consider increasing entresto in the future - ReDS vest reading was 38% - sees cardiologist Kirke Corin) 03/04/17  2: HTN- - BP slightly elevated but he says that he hasn't taken his medications yet today; will do so upon his return hom - saw PCP Laural Benes) 11/29/16 and returns on 01/24/17  3: Atrial fibrillation- -  currently on apixaban, digoxin and max metoprolol XL - last dig level on 11/22/16 was <0.2; need to clarify if he's actually taking the medication  4: Tobacco use- - smoking 2-3 cigarettes daily - patient has used inhalers in the past, but unsure of which kind - complete cessation discussed for 3 minutes with him  Patient did not bring his medications nor a list. Each medication was verbally reviewed with the patient and he was encouraged to bring the bottles to every visit to confirm accuracy of list.  Return in 2 months or sooner for any questions/problems before then. Marland Kitchen

## 2017-01-03 NOTE — Patient Instructions (Signed)
Resume weighing daily and call for an overnight weight gain of > 2 pounds or a weekly weight gain of >5 pounds.   Smoking Cessation Quitting smoking is important to your health and has many advantages. However, it is not always easy to quit since nicotine is a very addictive drug. Oftentimes, people try 3 times or more before being able to quit. This document explains the best ways for you to prepare to quit smoking. Quitting takes hard work and a lot of effort, but you can do it. ADVANTAGES OF QUITTING SMOKING  You will live longer, feel better, and live better.  Your body will feel the impact of quitting smoking almost immediately.  Within 20 minutes, blood pressure decreases. Your pulse returns to its normal level.  After 8 hours, carbon monoxide levels in the blood return to normal. Your oxygen level increases.  After 24 hours, the chance of having a heart attack starts to decrease. Your breath, hair, and body stop smelling like smoke.  After 48 hours, damaged nerve endings begin to recover. Your sense of taste and smell improve.  After 72 hours, the body is virtually free of nicotine. Your bronchial tubes relax and breathing becomes easier.  After 2 to 12 weeks, lungs can hold more air. Exercise becomes easier and circulation improves.  The risk of having a heart attack, stroke, cancer, or lung disease is greatly reduced.  After 1 year, the risk of coronary heart disease is cut in half.  After 5 years, the risk of stroke falls to the same as a nonsmoker.  After 10 years, the risk of lung cancer is cut in half and the risk of other cancers decreases significantly.  After 15 years, the risk of coronary heart disease drops, usually to the level of a nonsmoker.  The people you live with, especially any children, will be healthier.  You will have extra money to spend on things other than cigarettes. QUESTIONS TO THINK ABOUT BEFORE ATTEMPTING TO QUIT You may want to talk about  your answers with your health care provider.  Why do you want to quit?  If you tried to quit in the past, what helped and what did not?  What will be the most difficult situations for you after you quit? How will you plan to handle them?  Who can help you through the tough times? Your family? Friends? A health care provider?  What pleasures do you get from smoking? What ways can you still get pleasure if you quit? Here are some questions to ask your health care provider:  How can you help me to be successful at quitting?  What medicine do you think would be best for me and how should I take it?  What should I do if I need more help?  What is smoking withdrawal like? How can I get information on withdrawal? GET READY  Set a quit date.  Change your environment by getting rid of all cigarettes, ashtrays, matches, and lighters in your home, car, or work. Do not let people smoke in your home.  Review your past attempts to quit. Think about what worked and what did not. GET SUPPORT AND ENCOURAGEMENT You have a better chance of being successful if you have help. You can get support in many ways.  Tell your family, friends, and coworkers that you are going to quit and need their support. Ask them not to smoke around you.  Get individual, group, or telephone counseling and support. Programs are  available at local hospitals and health centers. Call your local health department for information about programs in your area.  Spiritual beliefs and practices may help some smokers quit.  Download a "quit meter" on your computer to keep track of quit statistics, such as how long you have gone without smoking, cigarettes not smoked, and money saved.  Get a self-help book about quitting smoking and staying off tobacco. LEARN NEW SKILLS AND BEHAVIORS  Distract yourself from urges to smoke. Talk to someone, go for a walk, or occupy your time with a task.  Change your normal routine. Take a  different route to work. Drink tea instead of coffee. Eat breakfast in a different place.  Reduce your stress. Take a hot bath, exercise, or read a book.  Plan something enjoyable to do every day. Reward yourself for not smoking.  Explore interactive web-based programs that specialize in helping you quit. GET MEDICINE AND USE IT CORRECTLY Medicines can help you stop smoking and decrease the urge to smoke. Combining medicine with the above behavioral methods and support can greatly increase your chances of successfully quitting smoking.  Nicotine replacement therapy helps deliver nicotine to your body without the negative effects and risks of smoking. Nicotine replacement therapy includes nicotine gum, lozenges, inhalers, nasal sprays, and skin patches. Some may be available over-the-counter and others require a prescription.  Antidepressant medicine helps people abstain from smoking, but how this works is unknown. This medicine is available by prescription.  Nicotinic receptor partial agonist medicine simulates the effect of nicotine in your brain. This medicine is available by prescription. Ask your health care provider for advice about which medicines to use and how to use them based on your health history. Your health care provider will tell you what side effects to look out for if you choose to be on a medicine or therapy. Carefully read the information on the package. Do not use any other product containing nicotine while using a nicotine replacement product.  RELAPSE OR DIFFICULT SITUATIONS Most relapses occur within the first 3 months after quitting. Do not be discouraged if you start smoking again. Remember, most people try several times before finally quitting. You may have symptoms of withdrawal because your body is used to nicotine. You may crave cigarettes, be irritable, feel very hungry, cough often, get headaches, or have difficulty concentrating. The withdrawal symptoms are only  temporary. They are strongest when you first quit, but they will go away within 10-14 days. To reduce the chances of relapse, try to:  Avoid drinking alcohol. Drinking lowers your chances of successfully quitting.  Reduce the amount of caffeine you consume. Once you quit smoking, the amount of caffeine in your body increases and can give you symptoms, such as a rapid heartbeat, sweating, and anxiety.  Avoid smokers because they can make you want to smoke.  Do not let weight gain distract you. Many smokers will gain weight when they quit, usually less than 10 pounds. Eat a healthy diet and stay active. You can always lose the weight gained after you quit.  Find ways to improve your mood other than smoking. FOR MORE INFORMATION  www.smokefree.gov  Document Released: 05/18/2001 Document Revised: 10/08/2013 Document Reviewed: 09/02/2011 [facility] Patient Information 2015 [place], [place]. This information is not intended to replace advice given to you by your health care provider. Make sure you discuss any questions you have with your health care provider.

## 2017-01-19 ENCOUNTER — Telehealth: Payer: Self-pay | Admitting: Neurology

## 2017-01-19 ENCOUNTER — Institutional Professional Consult (permissible substitution): Payer: Medicaid Other | Admitting: Neurology

## 2017-01-19 NOTE — Telephone Encounter (Signed)
Pt had a no show to apt that was scheduled at 11:30 am. today

## 2017-01-20 ENCOUNTER — Encounter: Payer: Self-pay | Admitting: Neurology

## 2017-01-20 ENCOUNTER — Other Ambulatory Visit: Payer: Medicare Other

## 2017-01-24 ENCOUNTER — Ambulatory Visit: Payer: Medicare Other | Admitting: Family Medicine

## 2017-01-28 ENCOUNTER — Encounter: Payer: Self-pay | Admitting: Gastroenterology

## 2017-01-28 ENCOUNTER — Ambulatory Visit: Payer: Medicare Other | Admitting: Gastroenterology

## 2017-02-02 ENCOUNTER — Other Ambulatory Visit: Payer: Self-pay

## 2017-02-02 ENCOUNTER — Ambulatory Visit (INDEPENDENT_AMBULATORY_CARE_PROVIDER_SITE_OTHER): Payer: Medicare Other

## 2017-02-02 DIAGNOSIS — I5022 Chronic systolic (congestive) heart failure: Secondary | ICD-10-CM

## 2017-02-03 ENCOUNTER — Encounter: Payer: Self-pay | Admitting: Family Medicine

## 2017-02-03 ENCOUNTER — Ambulatory Visit (INDEPENDENT_AMBULATORY_CARE_PROVIDER_SITE_OTHER): Payer: Medicare Other | Admitting: Family Medicine

## 2017-02-03 VITALS — BP 122/72 | HR 89 | Temp 98.0°F | Wt 268.4 lb

## 2017-02-03 DIAGNOSIS — M545 Low back pain: Secondary | ICD-10-CM | POA: Diagnosis not present

## 2017-02-03 DIAGNOSIS — R0602 Shortness of breath: Secondary | ICD-10-CM | POA: Diagnosis not present

## 2017-02-03 DIAGNOSIS — M25561 Pain in right knee: Secondary | ICD-10-CM

## 2017-02-03 DIAGNOSIS — G8929 Other chronic pain: Secondary | ICD-10-CM

## 2017-02-03 DIAGNOSIS — H538 Other visual disturbances: Secondary | ICD-10-CM | POA: Diagnosis not present

## 2017-02-03 DIAGNOSIS — J42 Unspecified chronic bronchitis: Secondary | ICD-10-CM

## 2017-02-03 DIAGNOSIS — J449 Chronic obstructive pulmonary disease, unspecified: Secondary | ICD-10-CM | POA: Insufficient documentation

## 2017-02-03 MED ORDER — BUDESONIDE-FORMOTEROL FUMARATE 160-4.5 MCG/ACT IN AERO: 2.0000 | INHALATION_SPRAY | Freq: Two times a day (BID) | RESPIRATORY_TRACT | 3 refills | Status: DC

## 2017-02-03 MED ORDER — ALBUTEROL SULFATE HFA 108 (90 BASE) MCG/ACT IN AERS: 2.0000 | INHALATION_SPRAY | Freq: Four times a day (QID) | RESPIRATORY_TRACT | 0 refills | Status: DC | PRN

## 2017-02-03 NOTE — Assessment & Plan Note (Addendum)
Will start on symbicort. Rx for rescue inhaler given today. Call with any concerns.

## 2017-02-03 NOTE — Assessment & Plan Note (Signed)
Stable. Has not gotten x-ray yet. Await results. Will treat as needed.

## 2017-02-03 NOTE — Patient Instructions (Addendum)
Dr Hyacinth Meeker at [facility] Friday 02/04/2017,  Please arrive to the office at 930 with your insurance card [address] [place], [place] [address] Phone:  [phone]   Friday 02/04/2017 arrive at [facility] [phone] [address] [place], [place] [address] Please anticipate a wait  Wednesday 03/16/2017  [facility] Endocrinology Dr Tedd Sias [address] [phone]  Please be sure to arrive 15 min early to your appointments.  If you cannot keep the appointment please contact the office so your appointment can be given to another patient in need.

## 2017-02-03 NOTE — Progress Notes (Signed)
BP 122/72   Pulse 89   Temp 98 F (36.7 C)   Wt 268 lb 6 oz (121.7 kg)   SpO2 100%   BMI 36.40 kg/m    Subjective:    Patient ID: Eugene Henderson, male    DOB: 07-19-1950, 66 y.o.   MRN: [id number]  HPI: Eugene Henderson is a 66 y.o. male  Chief Complaint  Patient presents with  . Back Pain  . Knee Pain  . Shortness of Breath  . Blurred Vision    Right eye, some pain and film over the eye   BACK PAIN- hanging in there. Flexeril helps. Feeling much better.  Duration: Chronic Mechanism of injury: unknown Location: L>R, bilateral and low back Onset: gradual Severity: moderate Quality: cramping and throbbing Frequency: waxes and wanes Radiation: L leg above the knee Aggravating factors: lifting, movement, walking, laying and bending Alleviating factors: ice, heat, laying and APAP Status: better Treatments attempted: rest, ice, heat, APAP, ibuprofen and aleve  Relief with NSAIDs?: No NSAIDs Taken Nighttime pain:  no Paresthesias / decreased sensation:  no Bowel / bladder incontinence:  no Fevers:  no Dysuria / urinary frequency:  no  KNEE PAIN Duration: chronic Involved knee: right Mechanism of injury: unknown Location:diffuse Onset: gradual Severity: severe  Quality:  dull and aching Frequency: constant Radiation: no Aggravating factors: weight bearing, walking, running and stairs  Alleviating factors: APAP and rest  Status: worse Treatments attempted: rest, ice, heat and APAP  Relief with NSAIDs?:  No NSAIDs Taken Weakness with weight bearing or walking: yes Sensation of giving way: yes Locking: yes Popping: no Bruising: no Swelling: yes Redness: no Paresthesias/decreased sensation: no Fevers: no  COPD COPD status: uncontrolled Satisfied with current treatment?: no Oxygen use: no Dyspnea frequency: daily Cough frequency: daily Rescue inhaler frequency: never- doesn't have one   Limitation of activity: yes Productive cough: no Last Spirometry:  today Pneumovax: Up to Date Influenza: Up to Date  Woke up Tuesday Morning with blurred vision. Looks like he's got a layer of plastic over his eye. Not black, just blurred. No better.   Relevant past medical, surgical, family and social history reviewed and updated as indicated. Interim medical history since our last visit reviewed. Allergies and medications reviewed and updated.  Review of Systems  Constitutional: Negative.   Eyes: Positive for visual disturbance. Negative for photophobia, pain, discharge, redness and itching.  Respiratory: Negative.   Cardiovascular: Negative.   Musculoskeletal: Positive for arthralgias, back pain and myalgias. Negative for gait problem, joint swelling, neck pain and neck stiffness.  Neurological: Negative.   Psychiatric/Behavioral: Negative.      Per HPI unless specifically indicated above     Objective:    BP 122/72   Pulse 89   Temp 98 F (36.7 C)   Wt 268 lb 6 oz (121.7 kg)   SpO2 100%   BMI 36.40 kg/m   Wt Readings from Last 3 Encounters:  02/03/17 268 lb 6 oz (121.7 kg)  01/03/17 273 lb (123.8 kg)  12/15/16 269 lb 2 oz (122.1 kg)     Visual Acuity Screening   Right eye Left eye Both eyes  Without correction: Fail 20/25 20/25  With correction:       Physical Exam  Constitutional: He is oriented to person, place, and time. He appears well-developed and well-nourished. No distress.  HENT:  Head: Normocephalic and atraumatic.  Right Ear: Hearing normal.  Left Ear: Hearing normal.  Nose: Nose normal.  Eyes: Conjunctivae and lids  are normal. Right eye exhibits no discharge. Left eye exhibits no discharge. No scleral icterus.  Cardiovascular: Normal rate, regular rhythm, normal heart sounds and intact distal pulses.  Exam reveals no gallop and no friction rub.   No murmur heard. Pulmonary/Chest: Effort normal and breath sounds normal. No respiratory distress. He has no wheezes. He has no rales. He exhibits no tenderness.    Musculoskeletal: Normal range of motion.  Neurological: He is alert and oriented to person, place, and time.  Skin: Skin is warm, dry and intact. No rash noted. He is not diaphoretic. No erythema. No pallor.  Well healing open wound on l knee  Psychiatric: He has a normal mood and affect. His speech is normal and behavior is normal. Judgment and thought content normal. Cognition and memory are normal.  Nursing note and vitals reviewed.   Results for orders placed or performed in visit on 12/15/16  HCV RNA quant  Result Value Ref Range   Hepatitis C Quantitation 9,330,000 IU/mL   HCV log10 6.970 log10 IU/mL   Test Information Comment   Hepatitis C Genotype  Result Value Ref Range   Hepatitis C Genotype 1a    Please Note (HCV): Comment   Hepatitis B Surface AntiBODY  Result Value Ref Range   Hep B Surface Ab, Qual Reactive   Hepatitis B Surface AntiGEN  Result Value Ref Range   Hepatitis B Surface Ag Negative Negative  Basic metabolic panel  Result Value Ref Range   Glucose 146 (H) 65 - 99 mg/dL   BUN 10 8 - 27 mg/dL   Creatinine, Ser 4.09 0.76 - 1.27 mg/dL   GFR calc non Af Amer 86 >59 mL/min/1.73   GFR calc Af Amer 99 >59 mL/min/1.73   BUN/Creatinine Ratio 11 10 - 24   Sodium 135 134 - 144 mmol/L   Potassium 4.2 3.5 - 5.2 mmol/L   Chloride 99 96 - 106 mmol/L   CO2 23 20 - 29 mmol/L   Calcium 9.4 8.6 - 10.2 mg/dL  Specimen status report  Result Value Ref Range   specimen status report Comment       Assessment & Plan:   Problem List Items Addressed This Visit      Respiratory   COPD (chronic obstructive pulmonary disease) (HCC)    Will start on symbicort. Rx for rescue inhaler given today. Call with any concerns.       Relevant Medications   budesonide-formoterol (SYMBICORT) 160-4.5 MCG/ACT inhaler   albuterol (PROVENTIL HFA;VENTOLIN HFA) 108 (90 Base) MCG/ACT inhaler     Other   Chronic low back pain - Primary    Stable. Has not gotten x-ray yet. Await  results. Will treat as needed.        Other Visit Diagnoses    Shortness of breath       Spirometry today shows COPD. Will treat. Starting advair and recheck 1 month. Call with any concerns.    Relevant Orders   Spirometry with Graph (Completed)   Blurred vision       Seeing eye doctor tomorrow. Await appointment.    Relevant Orders   Ambulatory referral to Ophthalmology   Chronic pain of right knee       Seeing ortho tomorrow. Await their input.        Follow up plan: Return in about 4 weeks (around 03/03/2017).

## 2017-02-04 DIAGNOSIS — M1711 Unilateral primary osteoarthritis, right knee: Secondary | ICD-10-CM | POA: Diagnosis not present

## 2017-02-04 DIAGNOSIS — M1612 Unilateral primary osteoarthritis, left hip: Secondary | ICD-10-CM | POA: Diagnosis not present

## 2017-02-05 ENCOUNTER — Other Ambulatory Visit: Payer: Self-pay | Admitting: Family Medicine

## 2017-02-14 ENCOUNTER — Ambulatory Visit: Payer: Medicare Other | Admitting: Family

## 2017-02-17 DIAGNOSIS — H4041X0 Glaucoma secondary to eye inflammation, right eye, stage unspecified: Secondary | ICD-10-CM | POA: Diagnosis not present

## 2017-02-21 ENCOUNTER — Encounter: Payer: Self-pay | Admitting: Family

## 2017-02-21 ENCOUNTER — Ambulatory Visit: Payer: Medicare Other | Attending: Family | Admitting: Family

## 2017-02-21 VITALS — BP 141/86 | HR 112 | Resp 18 | Ht 72.0 in | Wt 270.5 lb

## 2017-02-21 DIAGNOSIS — E0590 Thyrotoxicosis, unspecified without thyrotoxic crisis or storm: Secondary | ICD-10-CM | POA: Insufficient documentation

## 2017-02-21 DIAGNOSIS — Z8249 Family history of ischemic heart disease and other diseases of the circulatory system: Secondary | ICD-10-CM | POA: Diagnosis not present

## 2017-02-21 DIAGNOSIS — Z79899 Other long term (current) drug therapy: Secondary | ICD-10-CM | POA: Diagnosis not present

## 2017-02-21 DIAGNOSIS — I5022 Chronic systolic (congestive) heart failure: Secondary | ICD-10-CM | POA: Insufficient documentation

## 2017-02-21 DIAGNOSIS — J42 Unspecified chronic bronchitis: Secondary | ICD-10-CM | POA: Diagnosis not present

## 2017-02-21 DIAGNOSIS — M1990 Unspecified osteoarthritis, unspecified site: Secondary | ICD-10-CM | POA: Insufficient documentation

## 2017-02-21 DIAGNOSIS — Z833 Family history of diabetes mellitus: Secondary | ICD-10-CM | POA: Insufficient documentation

## 2017-02-21 DIAGNOSIS — F17210 Nicotine dependence, cigarettes, uncomplicated: Secondary | ICD-10-CM | POA: Diagnosis not present

## 2017-02-21 DIAGNOSIS — Z841 Family history of disorders of kidney and ureter: Secondary | ICD-10-CM | POA: Diagnosis not present

## 2017-02-21 DIAGNOSIS — J45909 Unspecified asthma, uncomplicated: Secondary | ICD-10-CM | POA: Insufficient documentation

## 2017-02-21 DIAGNOSIS — Z9660 Presence of unspecified orthopedic joint implant: Secondary | ICD-10-CM | POA: Insufficient documentation

## 2017-02-21 DIAGNOSIS — I4819 Other persistent atrial fibrillation: Secondary | ICD-10-CM

## 2017-02-21 DIAGNOSIS — I10 Essential (primary) hypertension: Secondary | ICD-10-CM

## 2017-02-21 DIAGNOSIS — I110 Hypertensive heart disease with heart failure: Secondary | ICD-10-CM | POA: Insufficient documentation

## 2017-02-21 DIAGNOSIS — Z7902 Long term (current) use of antithrombotics/antiplatelets: Secondary | ICD-10-CM | POA: Diagnosis not present

## 2017-02-21 DIAGNOSIS — Z9889 Other specified postprocedural states: Secondary | ICD-10-CM | POA: Diagnosis not present

## 2017-02-21 DIAGNOSIS — Z955 Presence of coronary angioplasty implant and graft: Secondary | ICD-10-CM | POA: Insufficient documentation

## 2017-02-21 DIAGNOSIS — I481 Persistent atrial fibrillation: Secondary | ICD-10-CM | POA: Insufficient documentation

## 2017-02-21 MED ORDER — SACUBITRIL-VALSARTAN 49-51 MG PO TABS: 1.0000 | ORAL_TABLET | Freq: Two times a day (BID) | ORAL | 5 refills | Status: DC

## 2017-02-21 NOTE — Patient Instructions (Addendum)
Continue weighing daily and call for an overnight weight gain of > 2 pounds or a weekly weight gain of >5 pounds.  Finish out the current entresto dose and then begin the 49/51mg  dose twice daily.     Smoking Cessation Quitting smoking is important to your health and has many advantages. However, it is not always easy to quit since nicotine is a very addictive drug. Oftentimes, people try 3 times or more before being able to quit. This document explains the best ways for you to prepare to quit smoking. Quitting takes hard work and a lot of effort, but you can do it. ADVANTAGES OF QUITTING SMOKING  You will live longer, feel better, and live better.  Your body will feel the impact of quitting smoking almost immediately.  Within 20 minutes, blood pressure decreases. Your pulse returns to its normal level.  After 8 hours, carbon monoxide levels in the blood return to normal. Your oxygen level increases.  After 24 hours, the chance of having a heart attack starts to decrease. Your breath, hair, and body stop smelling like smoke.  After 48 hours, damaged nerve endings begin to recover. Your sense of taste and smell improve.  After 72 hours, the body is virtually free of nicotine. Your bronchial tubes relax and breathing becomes easier.  After 2 to 12 weeks, lungs can hold more air. Exercise becomes easier and circulation improves.  The risk of having a heart attack, stroke, cancer, or lung disease is greatly reduced.  After 1 year, the risk of coronary heart disease is cut in half.  After 5 years, the risk of stroke falls to the same as a nonsmoker.  After 10 years, the risk of lung cancer is cut in half and the risk of other cancers decreases significantly.  After 15 years, the risk of coronary heart disease drops, usually to the level of a nonsmoker.  If you are pregnant, quitting smoking will improve your chances of having a healthy baby.  The people you live with, especially any  children, will be healthier.  You will have extra money to spend on things other than cigarettes. QUESTIONS TO THINK ABOUT BEFORE ATTEMPTING TO QUIT You may want to talk about your answers with your health care provider.  Why do you want to quit?  If you tried to quit in the past, what helped and what did not?  What will be the most difficult situations for you after you quit? How will you plan to handle them?  Who can help you through the tough times? Your family? Friends? A health care provider?  What pleasures do you get from smoking? What ways can you still get pleasure if you quit? Here are some questions to ask your health care provider:  How can you help me to be successful at quitting?  What medicine do you think would be best for me and how should I take it?  What should I do if I need more help?  What is smoking withdrawal like? How can I get information on withdrawal? GET READY  Set a quit date.  Change your environment by getting rid of all cigarettes, ashtrays, matches, and lighters in your home, car, or work. Do not let people smoke in your home.  Review your past attempts to quit. Think about what worked and what did not. GET SUPPORT AND ENCOURAGEMENT You have a better chance of being successful if you have help. You can get support in many ways.  Tell  your family, friends, and coworkers that you are going to quit and need their support. Ask them not to smoke around you.  Get individual, group, or telephone counseling and support. Programs are available at [employer] and health centers. Call your local health department for information about programs in your area.  Spiritual beliefs and practices may help some smokers quit.  Download a "quit meter" on your computer to keep track of quit statistics, such as how long you have gone without smoking, cigarettes not smoked, and money saved.  Get a self-help book about quitting smoking and staying off  tobacco. [place] yourself from urges to smoke. Talk to someone, go for a walk, or occupy your time with a task.  Change your normal routine. Take a different route to work. Drink tea instead of coffee. Eat breakfast in a different place.  Reduce your stress. Take a hot bath, exercise, or read a book.  Plan something enjoyable to do every day. Reward yourself for not smoking.  Explore interactive web-based programs that specialize in helping you quit. GET MEDICINE AND USE IT CORRECTLY Medicines can help you stop smoking and decrease the urge to smoke. Combining medicine with the above behavioral methods and support can greatly increase your chances of successfully quitting smoking.  Nicotine replacement therapy helps deliver nicotine to your body without the negative effects and risks of smoking. Nicotine replacement therapy includes nicotine gum, lozenges, inhalers, nasal sprays, and skin patches. Some may be available over-the-counter and others require a prescription.  Antidepressant medicine helps people abstain from smoking, but how this works is unknown. This medicine is available by prescription.  Nicotinic receptor partial agonist medicine simulates the effect of nicotine in your brain. This medicine is available by prescription. Ask your health care provider for advice about which medicines to use and how to use them based on your health history. Your health care provider will tell you what side effects to look out for if you choose to be on a medicine or therapy. Carefully read the information on the package. Do not use any other product containing nicotine while using a nicotine replacement product.  RELAPSE OR DIFFICULT SITUATIONS Most relapses occur within the first 3 months after quitting. Do not be discouraged if you start smoking again. Remember, most people try several times before finally quitting. You may have symptoms of withdrawal because  your body is used to nicotine. You may crave cigarettes, be irritable, feel very hungry, cough often, get headaches, or have difficulty concentrating. The withdrawal symptoms are only temporary. They are strongest when you first quit, but they will go away within 10-14 days. To reduce the chances of relapse, try to:  Avoid drinking alcohol. Drinking lowers your chances of successfully quitting.  Reduce the amount of caffeine you consume. Once you quit smoking, the amount of caffeine in your body increases and can give you symptoms, such as a rapid heartbeat, sweating, and anxiety.  Avoid smokers because they can make you want to smoke.  Do not let weight gain distract you. Many smokers will gain weight when they quit, usually less than 10 pounds. Eat a healthy diet and stay active. You can always lose the weight gained after you quit.  Find ways to improve your mood other than smoking. FOR MORE INFORMATION  www.smokefree.gov  Document Released: 05/18/2001 Document Revised: 10/08/2013 Document Reviewed: 09/02/2011 [facility] Patient Information 2015 [place], [place]. This information is not intended to replace advice given to  you by your health care provider. Make sure you discuss any questions you have with your health care provider.  

## 2017-02-21 NOTE — Progress Notes (Signed)
Agree with pharmacist note below.  Physical exam and ROS done by myself.  Scheduled for a sleep study on 03/01/17.  Will increase his entresto to 49/51mg  twice daily. He can finish out his current dose of 24/26mg  tablets and then begin the 49/51mg  dose. Encouraged him to weigh daily and call for an overnight weight gain of >2 pounds or a weekly weight gain of >5 pounds.  Return in 1 month or sooner for any questions/problems before the. Plan on getting a BMP at his next office visit.  Clarisa Kindred, FNP HF Clinic at [facility]    Patient ID: Eugene Henderson, male    DOB: [birth year], 66 y.o.   MRN: [id number]  HPI  [name] is a 66 y/o male with a history of atrial fibrillation, hyperthyroidism, HTN, asthma, arthritis, current tobacco use and chronic heart failure.   Last echo done 02/02/17 was 30-35%. Echo done 07/29/16 and showed an EF of 25-30% along with moderate MR and moderately increased PA pressure of 50 mm Hg. 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   Admitted 11/22/16 due to hypokalemia/hyponatremia due to nausea/vomiting. Treated and discharged after 4 days. Admitted 07/28/16 due to acute heart failure. Initially treated with IV diuretics and transitioned to oral diuretics. Elevated troponin thought to be due to demand ischemia. Also had new onset a fib and medications were started/adjusted. Discharged home after one week. Was in the ED on 06/03/16 with chest pain. Treated and released.   He presents today for his follow-up appointment with a chief complaint of chest pressure that has been around for the last few days. Also says he saw a doctor for blurriness in his right eye and is currently using eye drops. Denies any fatigue, edema or palpitations. Unsure of weight gain due to not weighing daily.    Past Medical History:  Diagnosis Date  . Arthritis   . Asthma   . CHF (congestive heart failure) (HCC)    . Hypertension   . Hyperthyroidism   . Noncompliance   . Persistent atrial fibrillation [facility])    Past Surgical History:  Procedure Laterality Date  . JOINT REPLACEMENT Right   . RIGHT/LEFT HEART CATH AND CORONARY ANGIOGRAPHY N/A 08/02/2016   Procedure: Right/Left Heart Cath and Coronary Angiography;  Surgeon: Iran Ouch, MD;  Location: [facility] INVASIVE CV LAB;  Service: Cardiovascular;  Laterality: N/A;  . TESTICLE SURGERY     Patient states that he had to have the tube fixed.   Family History  Problem Relation Age of Onset  . Diabetes Mother   . Diabetes Father   . Heart disease Brother   . Heart attack Brother   . Diabetes Brother   . Kidney failure Brother    Social History  Substance Use Topics  . Smoking status: Current Some Day Smoker    Packs/day: 0.25    Years: 25.00    Types: Cigarettes  . Smokeless tobacco: Never Used  . Alcohol use Yes     Comment: Socially - once a month   No Known Allergies  Prior to Admission medications   Medication Sig Start Date End Date Taking? Authorizing Provider  apixaban (ELIQUIS) 5 MG TABS tablet Take 1 tablet (5 mg total) by mouth 2 (two) times daily. 09/28/16  Yes Clarisa Kindred A, FNP  digoxin (LANOXIN) 0.125 MG tablet Take 1 tablet (0.125 mg total) by mouth daily. 09/28/16  Yes Delma Freeze, FNP  feeding  supplement (BOOST / RESOURCE BREEZE) LIQD Take 1 Container by mouth 2 (two) times daily between meals. 11/26/16  Yes Delfino Lovett, MD  furosemide (LASIX) 40 MG tablet Take 1 tablet (40 mg total) by mouth daily as needed. For SOB/LE edema Patient taking differently: Take 40 mg by mouth as needed. For SOB/LE edema 11/26/16  Yes Delfino Lovett, MD  sacubitril-valsartan (entresto) 24-26mg  Take 1 tablet by moth 2 (two) times daily      methimazole (TAPAZOLE) 10 MG tablet Take 2 tablets (20 mg total) by mouth 2 (two) times daily. 09/28/16  Yes Clarisa Kindred A, FNP  metoprolol (TOPROL-XL) 200 MG 24 hr tablet Take 1 tablet (200 mg total) by  mouth daily. Take with or immediately following a meal. 09/28/16  Yes Hackney, Inetta Fermo A, FNP  naproxen sodium (ANAPROX) 220 MG tablet Take 220 mg by mouth 2 (two) times daily with a meal.   Yes [provider]  propylthiouracil (PTU) 50 MG tablet Take 50 mg by mouth 3 (three) times daily.   Yes [provider]  spironolactone (ALDACTONE) 25 MG tablet Take 0.5 tablets (12.5 mg total) by mouth daily. 09/28/16  Yes Delma Freeze, FNP    Review of Systems  Constitutional: Negative for appetite change and fatigue.  HENT: Negative for congestion, postnasal drip and sore throat.   Eyes: Negative.   Respiratory: Positive for chest tightness and shortness of breath (when getting up steps). Negative for cough and wheezing.   Cardiovascular: Negative for chest pain, palpitations and leg swelling.  Gastrointestinal: Negative for abdominal distention and abdominal pain.  Endocrine: Negative.   Genitourinary: Negative.   Musculoskeletal: Positive for back pain. Negative for arthralgias.  Skin: Negative.   Allergic/Immunologic: Negative.   Neurological: Positive for dizziness. Negative for weakness and light-headedness.  Hematological: Negative for adenopathy. Does not bruise/bleed easily.  Psychiatric/Behavioral: Positive for sleep disturbance (sleep more during the day and watch tv at night). Negative for dysphoric mood. The patient is not nervous/anxious.    Vitals:   02/21/17 1426  BP: (!) 141/86  Pulse: (!) 112  Resp: 18  SpO2: 100%  Weight: 270 lb 8 oz (122.7 kg)  Height: 6' (1.829 m)   Wt Readings from Last 3 Encounters:  02/21/17 270 lb 8 oz (122.7 kg)  02/03/17 268 lb 6 oz (121.7 kg)  01/03/17 273 lb (123.8 kg)    Lab Results  Component Value Date   CREATININE 0.93 12/15/2016   CREATININE 0.72 12/06/2016   CREATININE 1.00 12/01/2016   Physical Exam  Constitutional: He is oriented to person, place, and time. He appears well-developed and well-nourished.  HENT:   Head: Normocephalic and atraumatic.  Neck: Normal range of motion. Neck supple. No JVD present.  Cardiovascular: Normal rate.  An irregular rhythm present.  Pulmonary/Chest: Effort normal. He has no wheezes. He has no rales.  Abdominal: Soft. He exhibits no distension. There is no tenderness.  Musculoskeletal: He exhibits no edema or tenderness.  Neurological: He is alert and oriented to person, place, and time.  Skin: Skin is warm and dry.  Psychiatric: He has a normal mood and affect. His behavior is normal. Thought content normal.  Nursing note and vitals reviewed.  Assessment & Plan:  1: Chronic heart failure with reduced ejection fraction- - NYHA class II - euvolemic today - down 3 pounds since last visit; admits to not weighing daily at home and he was reminded him to call for an overnight weight gain of >2 pounds or a  weekly weight gain of >5 pounds - still using salt on some foods;  Instructed to not add any salt to food. Reminded him to follow a 2000mg  sodium diet.  - BMP from 12/15/16 reviewed which shows sodium 135, potassium 4.2 and GFR 99 - This visit increased Entresto to 49/51 mg twice daily after finishing out the current dose of 24/26mg  twice daily.   - sees cardiologist Kirke Corin) 03/04/17  2: HTN- - BP slightly elevated; increase in Entresto may help BP  - Endorses having bad HA's when blood pressure elevated  - saw PCP Laural Benes) on 02/03/17  3: Atrial fibrillation- - currently on apixaban, digoxin and max metoprolol XL - last dig level on 11/22/16 was <0.2; patient states he is taking once daily   4. COPD- - Currently controlled on Symbicort - Has not had to use albuterol inhaler   Patient did not bring his medications nor a list. Each medication was verbally reviewed with the patient and he was encouraged to bring the bottles to every visit to confirm accuracy of list.  Return in 1 month or sooner for any questions/problems before then.   Cleopatra Cedar,  PharmD  Pharmacy Resident  02/21/17

## 2017-02-24 ENCOUNTER — Ambulatory Visit (INDEPENDENT_AMBULATORY_CARE_PROVIDER_SITE_OTHER): Payer: Medicare Other | Admitting: Vascular Surgery

## 2017-02-24 ENCOUNTER — Encounter (INDEPENDENT_AMBULATORY_CARE_PROVIDER_SITE_OTHER): Payer: Self-pay | Admitting: Vascular Surgery

## 2017-02-24 VITALS — BP 114/78 | HR 64 | Resp 17 | Wt 268.0 lb

## 2017-02-24 DIAGNOSIS — J42 Unspecified chronic bronchitis: Secondary | ICD-10-CM

## 2017-02-24 DIAGNOSIS — I5022 Chronic systolic (congestive) heart failure: Secondary | ICD-10-CM | POA: Diagnosis not present

## 2017-02-24 DIAGNOSIS — I481 Persistent atrial fibrillation: Secondary | ICD-10-CM | POA: Diagnosis not present

## 2017-02-24 DIAGNOSIS — I714 Abdominal aortic aneurysm, without rupture, unspecified: Secondary | ICD-10-CM

## 2017-02-24 DIAGNOSIS — I10 Essential (primary) hypertension: Secondary | ICD-10-CM

## 2017-02-24 DIAGNOSIS — I4819 Other persistent atrial fibrillation: Secondary | ICD-10-CM

## 2017-02-24 NOTE — Progress Notes (Signed)
MRN : [id number]  Eugene Henderson is a 66 y.o. ([birth year]) male who presents with chief complaint of  Chief Complaint  Patient presents with  . Routine Post Op  .  History of Present Illness: The patient returns to the office for surveillance of a known abdominal aortic aneurysm.  The aneurysm is located in the visceral segment of the aorta. Patient denies abdominal pain or back pain, no other abdominal complaints. No changes suggesting embolic episodes.   There have been no interval changes in the patient's overall health care since his last visit.  Patient denies amaurosis fugax or TIA symptoms. There is no history of claudication or rest pain symptoms of the lower extremities. The patient denies angina or shortness of breath.   Previous CT scan of the aorta and iliac arteries shows an AAA measured 4.5 cm in the visceral segment of the aorta  Current Meds  Medication Sig  . acetaminophen (TYLENOL) 500 MG tablet Take 500 mg by mouth every 6 (six) hours as needed.  Marland Kitchen albuterol (PROVENTIL HFA;VENTOLIN HFA) 108 (90 Base) MCG/ACT inhaler Inhale 2 puffs into the lungs every 6 (six) hours as needed for wheezing or shortness of breath.  Marland Kitchen apixaban (ELIQUIS) 5 MG TABS tablet Take 1 tablet (5 mg total) by mouth 2 (two) times daily.  . brimonidine-timolol (COMBIGAN) 0.2-0.5 % ophthalmic solution Place 1 drop into both eyes every 12 (twelve) hours.  . budesonide-formoterol (SYMBICORT) 160-4.5 MCG/ACT inhaler Inhale 2 puffs into the lungs 2 (two) times daily.  . cyclobenzaprine (FLEXERIL) 10 MG tablet TAKE 1 TABLET BY MOUTH EVERYDAY AT BEDTIME  . digoxin (LANOXIN) 0.125 MG tablet Take 1 tablet (0.125 mg total) by mouth daily.  . feeding supplement (BOOST / RESOURCE BREEZE) LIQD Take 1 Container by mouth 2 (two) times daily between meals.  . furosemide (LASIX) 40 MG tablet Take 1 tablet (40 mg total) by mouth daily as needed. For SOB/LE edema (Patient taking differently: Take 40 mg by mouth as  needed. For SOB/LE edema)  . methimazole (TAPAZOLE) 10 MG tablet Take 2 tablets (20 mg total) by mouth 2 (two) times daily.  . metoprolol (TOPROL-XL) 200 MG 24 hr tablet Take 1 tablet (200 mg total) by mouth daily. Take with or immediately following a meal.  . naproxen sodium (ANAPROX) 220 MG tablet Take 220 mg by mouth 2 (two) times daily with a meal.  . potassium chloride SA (K-DUR,KLOR-CON) 20 MEQ tablet Take 1 tablet (20 mEq total) by mouth daily.  . prednisoLONE acetate (PRED FORTE) 1 % ophthalmic suspension 1 drop 4 (four) times daily.  . sacubitril-valsartan (ENTRESTO) 49-51 MG Take 1 tablet by mouth 2 (two) times daily.  Marland Kitchen spironolactone (ALDACTONE) 25 MG tablet Take 0.5 tablets (12.5 mg total) by mouth daily.    Past Medical History:  Diagnosis Date  . Arthritis   . Asthma   . CHF (congestive heart failure) (HCC)   . Hypertension   . Hyperthyroidism   . Noncompliance   . Persistent atrial fibrillation [facility])     Past Surgical History:  Procedure Laterality Date  . JOINT REPLACEMENT Right   . RIGHT/LEFT HEART CATH AND CORONARY ANGIOGRAPHY N/A 08/02/2016   Procedure: Right/Left Heart Cath and Coronary Angiography;  Surgeon: Iran Ouch, MD;  Location: [facility] INVASIVE CV LAB;  Service: Cardiovascular;  Laterality: N/A;  . TESTICLE SURGERY     Patient states that he had to have the tube fixed.    Social History Social History  Substance  Use Topics  . Smoking status: Current Some Day Smoker    Packs/day: 0.25    Years: 25.00    Types: Cigarettes  . Smokeless tobacco: Never Used  . Alcohol use Yes     Comment: Socially - once a month    Family History Family History  Problem Relation Age of Onset  . Diabetes Mother   . Diabetes Father   . Heart disease Brother   . Heart attack Brother   . Diabetes Brother   . Kidney failure Brother     No Known Allergies   REVIEW OF SYSTEMS (Negative unless checked)  Constitutional: Weight loss  Fever   Chills Cardiac: Chest pain   Chest pressure   Palpitations   Shortness of breath when laying flat   Shortness of breath with exertion. Vascular:  Pain in legs with walking   Pain in legs at rest  History of DVT   Phlebitis   Swelling in legs   Varicose veins   Non-healing ulcers Pulmonary:   Uses home oxygen   Productive cough   Hemoptysis   Wheeze  COPD   Asthma Neurologic:  Dizziness   Seizures   History of stroke   History of TIA  Aphasia   Vissual changes   Weakness or numbness in arm   Weakness or numbness in leg Musculoskeletal:   Joint swelling   Joint pain   Low back pain Hematologic:  Easy bruising  Easy bleeding   Hypercoagulable state   Anemic Gastrointestinal:  Diarrhea   Vomiting  Gastroesophageal reflux/heartburn   Difficulty swallowing. Genitourinary:  Chronic kidney disease   Difficult urination  Frequent urination   Blood in urine Skin:  Rashes   Ulcers  Psychological:  History of anxiety    History of major depression.  Physical Examination  Vitals:   02/24/17 0931  BP: 114/78  Pulse: 64  Resp: 17  Weight: 121.6 kg (268 lb)   Body mass index is 36.35 kg/m. Gen: WD/WN, NAD Head: [place]/AT, No temporalis wasting.  Ear/Nose/Throat: Hearing grossly intact, nares w/o erythema or drainage Eyes: PER, EOMI, sclera nonicteric.  Neck: Supple, no large masses.   Pulmonary:  Good air movement, no audible wheezing bilaterally, no use of accessory muscles.  Cardiac: RRR, no JVD Vascular: no carotid bruits Vessel Right Left  Radial Palpable Palpable  Ulnar Palpable Palpable  Brachial Palpable Palpable  Carotid Palpable Palpable  Gastrointestinal:Distended. No guarding/no peritoneal signs.  Musculoskeletal: M/S 5/5 throughout.  Right knee deformity no atrophy.  Neurologic: CN 2-12 intact. Symmetrical.  Speech is fluent. Motor exam as listed above. Psychiatric: Judgment intact, Mood &  affect appropriate for pt's clinical situation. Dermatologic: No rashes or ulcers noted.  No changes consistent with cellulitis. Lymph : No lichenification or skin changes of chronic lymphedema.  CBC Lab Results  Component Value Date   WBC 4.2 11/29/2016   HGB 15.2 11/29/2016   HCT 44.2 11/29/2016   MCV 81 11/29/2016   PLT 197 11/29/2016    BMET    Component Value Date/Time   NA 135 12/15/2016 1037   K 4.2 12/15/2016 1037   CL 99 12/15/2016 1037   CO2 23 12/15/2016 1037   GLUCOSE 146 (H) 12/15/2016 1037   GLUCOSE 171 (H) 12/06/2016 1042   BUN 10 12/15/2016 1037   CREATININE 0.93 12/15/2016 1037   CALCIUM 9.4 12/15/2016 1037   GFRNONAA 86 12/15/2016 1037   GFRAA 99 12/15/2016 1037   CrCl cannot be calculated (Patient's most recent lab  result is older than the maximum 21 days allowed.).  COAG Lab Results  Component Value Date   INR 1.07 07/28/2016    Radiology No results found.   Assessment/Plan 1. Abdominal aortic aneurysm (AAA) without rupture (HCC) No surgery or intervention at this time.  Previous CT scan of the aorta and iliac arteries shows an AAA measured 4.5 cm in the visceral segment of the aorta  The patient has an asymptomatic abdominal aortic aneurysm that is greater than 4 cm but less than 5 cm in maximal diameter.  I have discussed the natural history of abdominal aortic aneurysm and the small risk of rupture for aneurysm less than 5 cm in size.  However, as these small aneurysms tend to enlarge over time, continued surveillance with ultrasound or CT scan is mandatory.   I have also discussed optimizing medical management with hypertension and lipid control and the importance of abstinence from tobacco.  The patient is also encouraged to exercise a minimum of 30 minutes 4 times a week.   Should the patient develop new onset abdominal or back pain or signs of peripheral embolization they are instructed to seek medical attention immediately and to alert  the physician providing care that they have an aneurysm.   The patient voices their understanding.  I have scheduled the patient to return in 6 months with an aortic duplex.  If the duplex ultrasound is not able to visualize the asneurysm then CT scan will be used.  - VAS US AORTA/IVC/ILIACS; Future  2. Chronic systolic heart failure (HCC) Continue cardiac and antihypertensive medications as already ordered and reviewed, no changes at this time.  Continue statin as ordered and reviewed, no changes at this time  Nitrates PRN for chest pain   3. Essential hypertension Continue antihypertensive medications as already ordered, these medications have been reviewed and there are no changes at this time.   4. Persistent atrial fibrillation (HCC) Continue antiarrhythmia medications as already ordered, these medications have been reviewed and there are no changes at this time.  Continue anticoagulation as ordered by Cardiology Service   5. Chronic bronchitis, unspecified chronic bronchitis type (HCC) Continue pulmonary medications and aerosols as already ordered, these medications have been reviewed and there are no changes at this time.    Levora Dredge, MD  02/24/2017 10:01 AM

## 2017-03-01 ENCOUNTER — Telehealth: Payer: Self-pay | Admitting: Neurology

## 2017-03-01 ENCOUNTER — Institutional Professional Consult (permissible substitution): Payer: Medicaid Other | Admitting: Neurology

## 2017-03-01 NOTE — Telephone Encounter (Signed)
Pt called today to re schedule his sleep consult apt. Spoke with Sheena in the sleep lab and has already rescheduled.

## 2017-03-04 ENCOUNTER — Ambulatory Visit: Payer: Medicare (Managed Care) | Admitting: Cardiovascular Disease

## 2017-03-21 ENCOUNTER — Ambulatory Visit: Payer: Medicare Other | Admitting: Family

## 2017-03-21 ENCOUNTER — Telehealth: Payer: Self-pay | Admitting: Neurology

## 2017-03-21 NOTE — Telephone Encounter (Signed)
Called patient to make them aware that I would need to reschedule his apt due to Dr Dohmeier having something come up and wont be here on 10/25. I would offer an apt on Nov 2nd as she is opening up that Friday. Otherwise next availability is on Nov 15. LVM for patient to call back and let me know what they would like.

## 2017-03-27 ENCOUNTER — Other Ambulatory Visit: Payer: Self-pay | Admitting: Family Medicine

## 2017-03-31 ENCOUNTER — Institutional Professional Consult (permissible substitution): Payer: Medicaid Other | Admitting: Neurology

## 2017-04-08 ENCOUNTER — Encounter: Payer: Self-pay | Admitting: Student

## 2017-04-08 ENCOUNTER — Ambulatory Visit: Payer: Medicare Other | Admitting: Student

## 2017-04-08 NOTE — Progress Notes (Deleted)
Cardiology Office Note    Date:  04/08/2017   ID:  Eugene Henderson, DOB 08-18-1950, MRN [id number]  PCP:  Dorcas Carrow, DO  Cardiologist:  Dr. Kirke Corin   No chief complaint on file.   History of Present Illness:    Eugene Henderson is a 66 y.o. 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   He was last examined by Dr. Kirke Corin in 10/2016 and reported doing well from his recent hospitalization. He was euvolemic on examination and was continued on Lasix 40mg  BID. He had recently been started on Entresto and was tolerating this well. Options regarding a DCCV were reviewed with the patient but he declined the procedure. A repeat echocardiogram was obtained in 01/2017 and showed a slightly improved EF of 30% to 35% with diffuse hypokinesis, moderate MR, and mildly to moderately increased pulmonary pressures.   Past Medical History:  Diagnosis Date  . Arthritis   . Asthma   . CHF (congestive heart failure) (HCC)    a. EF 25-30% by echo in 07/2016 with cath showing no significant CAD  . Hypertension   . Hyperthyroidism   . Noncompliance   . Persistent atrial fibrillation [facility])     Past Surgical History:  Procedure Laterality Date  . JOINT REPLACEMENT Right   . RIGHT/LEFT HEART CATH AND CORONARY ANGIOGRAPHY N/A 08/02/2016   Procedure: Right/Left Heart Cath and Coronary Angiography;  Surgeon: Iran Ouch, MD;  Location: [facility] INVASIVE CV LAB;  Service: Cardiovascular;  Laterality: N/A;  . TESTICLE SURGERY     Patient states that he had to have the tube fixed.    Current Medications: Outpatient Medications Prior to Visit  Medication Sig Dispense Refill  . acetaminophen  (TYLENOL) 500 MG tablet Take 500 mg by mouth every 6 (six) hours as needed.    Marland Kitchen albuterol (PROVENTIL HFA;VENTOLIN HFA) 108 (90 Base) MCG/ACT inhaler Inhale 2 puffs into the lungs every 6 (six) hours as needed for wheezing or shortness of breath. 1 Inhaler 0  . apixaban (ELIQUIS) 5 MG TABS tablet Take 1 tablet (5 mg total) by mouth 2 (two) times daily. 60 tablet 5  . brimonidine-timolol (COMBIGAN) 0.2-0.5 % ophthalmic solution Place 1 drop into both eyes every 12 (twelve) hours.    . budesonide-formoterol (SYMBICORT) 160-4.5 MCG/ACT inhaler Inhale 2 puffs into the lungs 2 (two) times daily. 1 Inhaler 3  . cyclobenzaprine (FLEXERIL) 10 MG tablet TAKE 1 TABLET BY MOUTH EVERYDAY AT BEDTIME 30 tablet 0  . digoxin (LANOXIN) 0.125 MG tablet Take 1 tablet (0.125 mg total) by mouth daily. 30 tablet 5  . feeding supplement (BOOST / RESOURCE BREEZE) LIQD Take 1 Container by mouth 2 (two) times daily between meals. 60 Container 2  . furosemide (LASIX) 40 MG tablet Take 1 tablet (40 mg total) by mouth daily as needed. For SOB/LE edema (Patient taking differently: Take 40 mg by mouth as needed. For SOB/LE edema) 60 tablet 5  . methimazole (TAPAZOLE) 10 MG tablet Take 2 tablets (20 mg total) by mouth 2 (two) times daily. 60 tablet 5  . metoprolol (TOPROL-XL) 200 MG 24 hr tablet Take 1 tablet (200 mg total) by mouth daily. Take with or immediately following a meal. 30 tablet 5  .  naproxen sodium (ANAPROX) 220 MG tablet Take 220 mg by mouth 2 (two) times daily with a meal.    . potassium chloride SA (K-DUR,KLOR-CON) 20 MEQ tablet Take 1 tablet (20 mEq total) by mouth daily. 30 tablet 3  . prednisoLONE acetate (PRED FORTE) 1 % ophthalmic suspension 1 drop 4 (four) times daily.    . sacubitril-valsartan (ENTRESTO) 49-51 MG Take 1 tablet by mouth 2 (two) times daily. 60 tablet 5  . spironolactone (ALDACTONE) 25 MG tablet Take 0.5 tablets (12.5 mg total) by mouth daily. 30 tablet 5   No facility-administered  medications prior to visit.      Allergies:   Patient has no known allergies.   Social History   Social History  . Marital status: Single    Spouse name: N/A  . Number of children: N/A  . Years of education: N/A   Occupational History  . retired    Social History Main Topics  . Smoking status: Current Some Day Smoker    Packs/day: 0.25    Years: 25.00    Types: Cigarettes  . Smokeless tobacco: Never Used  . Alcohol use Yes     Comment: Socially - once a month  . Drug use: No  . Sexual activity: Not on file   Other Topics Concern  . Not on file   Social History Narrative  . No narrative on file     Family History:  The patient's ***family history includes Diabetes in his brother, father, and mother; Heart attack in his brother; Heart disease in his brother; Kidney failure in his brother.   Review of Systems:   Please see the history of present illness.     General:  No chills, fever, night sweats or weight changes.  Cardiovascular:  No chest pain, dyspnea on exertion, edema, orthopnea, palpitations, paroxysmal nocturnal dyspnea. Dermatological: No rash, lesions/masses Respiratory: No cough, dyspnea Urologic: No hematuria, dysuria Abdominal:   No nausea, vomiting, diarrhea, bright red blood per rectum, melena, or hematemesis Neurologic:  No visual changes, wkns, changes in mental status. All other systems reviewed and are otherwise negative except as noted above.   Physical Exam:    VS:  There were no vitals taken for this visit.   General: Well developed, well nourished,male appearing in no acute distress. Head: Normocephalic, atraumatic, sclera non-icteric, no xanthomas, nares are without discharge.  Neck: No carotid bruits. JVD not elevated.  Lungs: Respirations regular and unlabored, without wheezes or rales.  Heart: ***Regular rate and rhythm. No S3 or S4.  No murmur, no rubs, or gallops appreciated. Abdomen: Soft, non-tender, non-distended with normoactive  bowel sounds. No hepatomegaly. No rebound/guarding. No obvious abdominal masses. Msk:  Strength and tone appear normal for age. No joint deformities or effusions. Extremities: No clubbing or cyanosis. No edema.  Distal pedal pulses are 2+ bilaterally. Neuro: Alert and oriented X 3. Moves all extremities spontaneously. No focal deficits noted. Psych:  Responds to questions appropriately with a normal affect. Skin: No rashes or lesions noted  Wt Readings from Last 3 Encounters:  02/24/17 268 lb (121.6 kg)  02/21/17 270 lb 8 oz (122.7 kg)  02/03/17 268 lb 6 oz (121.7 kg)        Studies/Labs Reviewed:   EKG:  EKG is*** ordered today.  The ekg ordered today demonstrates ***  Recent Labs: 11/22/2016: B Natriuretic Peptide 44.0 11/26/2016: Magnesium 1.9 11/29/2016: ALT 36; Hemoglobin 15.2; Platelets 197; TSH 0.553 12/15/2016: BUN 10; Creatinine, Ser 0.93; Potassium 4.2; Sodium 135  Lipid Panel    Component Value Date/Time   CHOL 155 11/29/2016 0945   TRIG 89 11/29/2016 0945   HDL 68 11/29/2016 0945   LDLCALC 69 11/29/2016 0945    Additional studies/ records that were reviewed today include:   Cardiac Catheterization: 08/02/2016 1. No significant coronary artery disease. 2. Severely reduced LV systolic function by echo. Left ventricular angiography was not performed.  3. Right heart catheterization showed minimally elevated filling pressures and pulmonary hypertension. Moderately reduced cardiac output. RA pressure: 12 mmHg, RV pressure: 40 over 6 mmHg, PA pressure 37/12 with a mean of 12 mmHg. Pulmonary capillary wedge pressure was 12. Cardiac output was 4.16 with a cardiac index of 1.77.  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 His volume status appears to be close to normal. Thus, I switched furosemide to 40 mg by mouth twice daily. Ventricular rate is still not optimally controlled and thus I increased Toprol to 200 mg once  daily. Start long-term anticoagulation with Eliquis 5 mg twice daily starting tomorrow.  The patient was noted to have apnea episodes during cardiac catheterization. Recommend outpatient evaluation for sleep apnea. Avoid catheterization via the right radial artery in the future due to tortuous innominate artery and significantly dilated aortic root.  Echocardiogram: 01/2017 Study Conclusions  - Left ventricle: The cavity size was normal. Wall thickness was   increased in a pattern of moderate LVH. Systolic function was   moderately to severely reduced. The estimated ejection fraction   was in the range of 30% to 35%. Diffuse hypokinesis. Regional   wall motion abnormalities cannot be excluded. - Aorta: Aortic root dimension: 47 mm (ED). Ascending aortic   diameter: 50 mm (S). - Aortic root: The aortic root was moderately dilated. - Ascending aorta: The ascending aorta was moderately dilated. - Mitral valve: There was moderate regurgitation. - Left atrium: The atrium was severely dilated. - Right ventricle: The cavity size was mildly dilated. Systolic   function was mildly to moderately reduced. - Right atrium: The atrium was severely dilated. - Pulmonary arteries: Systolic pressure was mildly to moderately   increased, in the range of 40 mm Hg to 45 mm Hg.  Assessment:    No diagnosis found.   Plan:   In order of problems listed above:  1. Chronic combined systolic and diastolic CHF - EF reduced to 25-30% by echo in 07/2016, improved to 30-35% by recent echocardiogram.  - ***  2. Ischemic Cardiomyopathy -  cath in 07/2016 showed no significant CAD. Thought to possibly be tachycardia induced in the setting of persistent atrial fibrillation.  - ***  3. Persistent atrial fibrillation - on Eliquis  4. HTN - BP at *** during today's visit.   5. HLD - Lipid Panel in 11/2016 showed total cholesterol of 155, HDL 68, and LDL 69.  6. AAA - measuring 4.5 cm by CT in  11/2016. - followed by Vascular.  Medication Adjustments/Labs and Tests Ordered: Current medicines are reviewed at length with the patient today.  Concerns regarding medicines are outlined above.  Medication changes, Labs and Tests ordered today are listed in the Patient Instructions below. There are no Patient Instructions on file for this visit.   Signed, Ellsworth Lennox, PA-C  04/08/2017 11:57 AM    [facility] [address] [place], [address] [place], [place]  [address] Phone: [phone]; Fax: [phone]  [address] [place], [place] [address] Phone: [phone]

## 2017-04-12 ENCOUNTER — Encounter: Payer: Self-pay | Admitting: Student

## 2017-05-06 ENCOUNTER — Encounter: Payer: Self-pay | Admitting: Emergency Medicine

## 2017-05-06 ENCOUNTER — Emergency Department
Admission: EM | Admit: 2017-05-06 | Discharge: 2017-05-06 | Disposition: A | Discharge status: Home / Self Care | Payer: Medicare (Managed Care) | Attending: Emergency Medicine | Admitting: Emergency Medicine

## 2017-05-06 DIAGNOSIS — Z79899 Other long term (current) drug therapy: Secondary | ICD-10-CM | POA: Insufficient documentation

## 2017-05-06 DIAGNOSIS — J449 Chronic obstructive pulmonary disease, unspecified: Secondary | ICD-10-CM | POA: Diagnosis not present

## 2017-05-06 DIAGNOSIS — H5711 Ocular pain, right eye: Secondary | ICD-10-CM | POA: Diagnosis present

## 2017-05-06 DIAGNOSIS — J45909 Unspecified asthma, uncomplicated: Secondary | ICD-10-CM | POA: Insufficient documentation

## 2017-05-06 DIAGNOSIS — Z7901 Long term (current) use of anticoagulants: Secondary | ICD-10-CM | POA: Diagnosis not present

## 2017-05-06 DIAGNOSIS — I5022 Chronic systolic (congestive) heart failure: Secondary | ICD-10-CM | POA: Insufficient documentation

## 2017-05-06 DIAGNOSIS — I110 Hypertensive heart disease with heart failure: Secondary | ICD-10-CM | POA: Insufficient documentation

## 2017-05-06 DIAGNOSIS — Z9660 Presence of unspecified orthopedic joint implant: Secondary | ICD-10-CM | POA: Diagnosis not present

## 2017-05-06 DIAGNOSIS — H10021 Other mucopurulent conjunctivitis, right eye: Secondary | ICD-10-CM | POA: Diagnosis not present

## 2017-05-06 DIAGNOSIS — F17210 Nicotine dependence, cigarettes, uncomplicated: Secondary | ICD-10-CM | POA: Diagnosis not present

## 2017-05-06 MED ORDER — NAPHAZOLINE-PHENIRAMINE 0.025-0.3 % OP SOLN: 1.0000 [drp] | Freq: Four times a day (QID) | OPHTHALMIC | 0 refills | Status: DC | PRN

## 2017-05-06 MED ORDER — GENTAMICIN SULFATE 0.3 % OP SOLN: 1.0000 [drp] | OPHTHALMIC | 0 refills | Status: DC

## 2017-05-06 NOTE — ED Triage Notes (Signed)
First Nurse Note:  Arrives with C/O right eye redness.  Pateitn AAOx3.  Skin warm and dry. NAD

## 2017-05-06 NOTE — ED Provider Notes (Signed)
[facility] Emergency Department Provider Note   ____________________________________________   First MD Initiated Contact with Patient 05/06/17 1233     (approximate)  I have reviewed the triage vital signs and the nursing notes.   HISTORY  Chief Complaint Eye Pain    HPI Eugene Henderson is a 66 y.o. male patient complain of right eye pain for approximately one month. Patient awakened every morning with right matted eyes shut. Patient denies trauma to right eye. Patient state unable to go to PCP or ophthalmologist secondary to lack of transportation. Patient rated pain as 10 over 10. Patient describes pain as "achy". No palliative measures for complaint.   Past Medical History:  Diagnosis Date  . Arthritis   . Asthma   . CHF (congestive heart failure) (HCC)    a. EF 25-30% by echo in 07/2016 with cath showing no significant CAD b. 01/2017: EF 30-35% with diffuse HK and moderate MR  . Hypertension   . Hyperthyroidism   . Noncompliance   . Persistent atrial fibrillation [facility])     Patient Active Problem List   Diagnosis Date Noted  . COPD (chronic obstructive pulmonary disease) (HCC) 02/03/2017  . 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) 12/15/2016  . AAA (abdominal aortic aneurysm) (HCC) 11/29/2016  . Chronic low back pain 11/29/2016  . Alcohol abuse 11/29/2016  . Hyponatremia 11/22/2016  . Chronic systolic heart failure (HCC) 08/19/2016  . Tobacco use 08/19/2016  . Snoring 08/19/2016  . Persistent atrial fibrillation (HCC) 07/28/2016  . Hyperthyroidism 07/28/2016  . Noncompliance with medications 07/28/2016  . Essential hypertension 07/28/2016    Past Surgical History:  Procedure Laterality Date  . JOINT REPLACEMENT Right   . RIGHT/LEFT HEART CATH AND CORONARY ANGIOGRAPHY N/A 08/02/2016   Procedure: Right/Left Heart Cath and Coronary Angiography;  Surgeon: Iran Ouch, MD;  Location: [facility] INVASIVE CV LAB;  Service:  Cardiovascular;  Laterality: N/A;  . TESTICLE SURGERY     Patient states that he had to have the tube fixed.    Prior to Admission medications   Medication Sig Start Date End Date Taking? Authorizing Provider  acetaminophen (TYLENOL) 500 MG tablet Take 500 mg by mouth every 6 (six) hours as needed.    [provider]  albuterol (PROVENTIL HFA;VENTOLIN HFA) 108 (90 Base) MCG/ACT inhaler Inhale 2 puffs into the lungs every 6 (six) hours as needed for wheezing or shortness of breath. 02/03/17   Padin, Megan P, DO  apixaban (ELIQUIS) 5 MG TABS tablet Take 1 tablet (5 mg total) by mouth 2 (two) times daily. 09/28/16   Delma Freeze, FNP  brimonidine-timolol (COMBIGAN) 0.2-0.5 % ophthalmic solution Place 1 drop into both eyes every 12 (twelve) hours.    [provider]  budesonide-formoterol (SYMBICORT) 160-4.5 MCG/ACT inhaler Inhale 2 puffs into the lungs 2 (two) times daily. 02/03/17   Rittenhouse, Megan P, DO  cyclobenzaprine (FLEXERIL) 10 MG tablet TAKE 1 TABLET BY MOUTH EVERYDAY AT BEDTIME 03/28/17   Rennels, Megan P, DO  digoxin (LANOXIN) 0.125 MG tablet Take 1 tablet (0.125 mg total) by mouth daily. 09/28/16   Delma Freeze, FNP  feeding supplement (BOOST / RESOURCE BREEZE) LIQD Take 1 Container by mouth 2 (two) times daily between meals. 11/26/16   Delfino Lovett, MD  furosemide (LASIX) 40 MG tablet Take 1 tablet (40 mg total) by mouth daily as needed. For SOB/LE edema Patient taking differently: Take 40 mg by mouth as needed. For SOB/LE edema 11/26/16  Delfino Lovett, MD  gentamicin (GARAMYCIN) 0.3 % ophthalmic solution Place 1 drop into both eyes every 4 (four) hours. 05/06/17   Joni Reining, PA-C  methimazole (TAPAZOLE) 10 MG tablet Take 2 tablets (20 mg total) by mouth 2 (two) times daily. 09/28/16   Delma Freeze, FNP  metoprolol (TOPROL-XL) 200 MG 24 hr tablet Take 1 tablet (200 mg total) by mouth daily. Take with or immediately following a meal. 09/28/16   Hackney, Jarold Song, FNP   naphazoline-pheniramine (NAPHCON-A) 0.025-0.3 % ophthalmic solution Place 1 drop into both eyes 4 (four) times daily as needed for eye irritation. 05/06/17   Joni Reining, PA-C  naproxen sodium (ANAPROX) 220 MG tablet Take 220 mg by mouth 2 (two) times daily with a meal.    [provider]  potassium chloride SA (K-DUR,KLOR-CON) 20 MEQ tablet Take 1 tablet (20 mEq total) by mouth daily. 12/02/16   Delma Freeze, FNP  prednisoLONE acetate (PRED FORTE) 1 % ophthalmic suspension 1 drop 4 (four) times daily.    [provider]  sacubitril-valsartan (ENTRESTO) 49-51 MG Take 1 tablet by mouth 2 (two) times daily. 02/21/17   Delma Freeze, FNP  spironolactone (ALDACTONE) 25 MG tablet Take 0.5 tablets (12.5 mg total) by mouth daily. 09/28/16   Delma Freeze, FNP    Allergies Patient has no known allergies.  Family History  Problem Relation Age of Onset  . Diabetes Mother   . Diabetes Father   . Heart disease Brother   . Heart attack Brother   . Diabetes Brother   . Kidney failure Brother     Social History Social History   Tobacco Use  . Smoking status: Current Some Day Smoker    Packs/day: 0.25    Years: 25.00    Pack years: 6.25    Types: Cigarettes  . Smokeless tobacco: Never Used  Substance Use Topics  . Alcohol use: Yes    Comment: Socially - once a month  . Drug use: No    Review of Systems Constitutional: No fever/chills Eyes: Matted eyelids and purulent drainage of the right eye.  ENT: No sore throat. Cardiovascular: Denies chest pain. Respiratory: Denies shortness of breath. Gastrointestinal: No abdominal pain.  No nausea, no vomiting.  No diarrhea.  No constipation. Genitourinary: Negative for dysuria. Musculoskeletal: Negative for back pain. Skin: Negative for rash. Neurological: Negative for headaches, focal weakness or numbness. Endocrine:Hypertension hypothyroidism.  ____________________________________________   PHYSICAL  EXAM:  VITAL SIGNS: ED Triage Vitals  Enc Vitals Group     BP 05/06/17 1210 (!) 135/97     Pulse Rate 05/06/17 1210 89     Resp 05/06/17 1210 16     Temp 05/06/17 1210 97.9 F (36.6 C)     Temp Source 05/06/17 1210 Oral     SpO2 05/06/17 1210 99 %     Weight 05/06/17 1211 265 lb (120.2 kg)     Height 05/06/17 1211 6' (1.829 m)     Head Circumference --      Peak Flow --      Pain Score 05/06/17 1210 10     Pain Loc --      Pain Edu? --      Excl. in GC? --    Constitutional: Alert and oriented. Well appearing and in no acute distress. Eyes: Conjunctiva is injected. Dried yellow-green right eye discharge. Marland Kitchen PERRL. EOMI. Cardiovascular: Normal rate, regular rhythm. Grossly normal heart sounds.  Good peripheral circulation. Respiratory: Normal  respiratory effort.  No retractions. Lungs CTAB. Neurologic:  Normal speech and language. No gross focal neurologic deficits are appreciated. No gait instability. Skin:  Skin is warm, dry and intact. No rash noted. Psychiatric: Mood and affect are normal. Speech and behavior are normal.  ____________________________________________   LABS (all labs ordered are listed, but only abnormal results are displayed)  Labs Reviewed - No data to display ____________________________________________  EKG   ____________________________________________  RADIOLOGY  No results found.  ____________________________________________   PROCEDURES  Procedure(s) performed: None  Procedures  Critical Care performed: No  ____________________________________________   INITIAL IMPRESSION / ASSESSMENT AND PLAN / ED COURSE  As part of my medical decision making, I reviewed the following data within the electronic MEDICAL RECORD NUMBER    Bacterial conjunctivitis of the right eye. Patient given discharge care instructions. Patient advised to use eyedrops as directed. Patient advised to follow-up with ophthalmology if no improvement in 3 days.     ____________________________________________   FINAL CLINICAL IMPRESSION(S) / ED DIAGNOSES  Final diagnoses:  Other mucopurulent conjunctivitis of right eye     ED Discharge Orders        Ordered    gentamicin (GARAMYCIN) 0.3 % ophthalmic solution  Every 4 hours     05/06/17 1248    naphazoline-pheniramine (NAPHCON-A) 0.025-0.3 % ophthalmic solution  4 times daily PRN     05/06/17 1248       Note:  This document was prepared using Dragon voice recognition software and may include unintentional dictation errors.    Joni ReiningSmith, Galadriel Shroff K, PA-C 05/06/17 1250    Sharyn CreamerQuale, Mark, MD 05/06/17 [phone]

## 2017-05-06 NOTE — ED Triage Notes (Signed)
Pt in via POV with complaints of worsening right eye pain x approximately one month, unable to open the eye fully.  Pt reports using eye wash without relief, reports being unable to get in with the opthalmolagist.  Sclera red.  NAD noted at this time.

## 2017-05-06 NOTE — ED Notes (Signed)
[phone] (wendy)

## 2017-05-09 ENCOUNTER — Ambulatory Visit
Admission: RE | Admit: 2017-05-09 | Discharge: 2017-05-09 | Disposition: A | Discharge status: Home / Self Care | Payer: Medicare (Managed Care) | Source: Ambulatory Visit | Attending: Ophthalmology | Admitting: Ophthalmology

## 2017-05-09 ENCOUNTER — Ambulatory Visit (INDEPENDENT_AMBULATORY_CARE_PROVIDER_SITE_OTHER): Payer: Medicare (Managed Care) | Admitting: Family Medicine

## 2017-05-09 ENCOUNTER — Other Ambulatory Visit
Admission: RE | Admit: 2017-05-09 | Discharge: 2017-05-09 | Disposition: A | Discharge status: Home / Self Care | Payer: Medicare (Managed Care) | Source: Ambulatory Visit | Attending: Ophthalmology | Admitting: Ophthalmology

## 2017-05-09 ENCOUNTER — Encounter: Payer: Self-pay | Admitting: Family Medicine

## 2017-05-09 ENCOUNTER — Ambulatory Visit: Payer: Medicare Other

## 2017-05-09 ENCOUNTER — Other Ambulatory Visit: Payer: Self-pay | Admitting: Ophthalmology

## 2017-05-09 VITALS — BP 154/95 | HR 116 | Temp 98.3°F | Wt 280.2 lb

## 2017-05-09 DIAGNOSIS — Z748 Other problems related to care provider dependency: Secondary | ICD-10-CM

## 2017-05-09 DIAGNOSIS — Z23 Encounter for immunization: Secondary | ICD-10-CM | POA: Diagnosis not present

## 2017-05-09 DIAGNOSIS — I5022 Chronic systolic (congestive) heart failure: Secondary | ICD-10-CM | POA: Diagnosis not present

## 2017-05-09 DIAGNOSIS — H209 Unspecified iridocyclitis: Secondary | ICD-10-CM | POA: Diagnosis not present

## 2017-05-09 DIAGNOSIS — I714 Abdominal aortic aneurysm, without rupture, unspecified: Secondary | ICD-10-CM

## 2017-05-09 DIAGNOSIS — B182 Chronic viral hepatitis C: Secondary | ICD-10-CM | POA: Diagnosis not present

## 2017-05-09 DIAGNOSIS — H5711 Ocular pain, right eye: Secondary | ICD-10-CM | POA: Diagnosis not present

## 2017-05-09 DIAGNOSIS — Z111 Encounter for screening for respiratory tuberculosis: Secondary | ICD-10-CM | POA: Insufficient documentation

## 2017-05-09 DIAGNOSIS — E871 Hypo-osmolality and hyponatremia: Secondary | ICD-10-CM

## 2017-05-09 DIAGNOSIS — J9811 Atelectasis: Secondary | ICD-10-CM | POA: Diagnosis not present

## 2017-05-09 DIAGNOSIS — I10 Essential (primary) hypertension: Secondary | ICD-10-CM

## 2017-05-09 DIAGNOSIS — E0590 Thyrotoxicosis, unspecified without thyrotoxic crisis or storm: Secondary | ICD-10-CM

## 2017-05-09 DIAGNOSIS — A158 Other respiratory tuberculosis: Secondary | ICD-10-CM

## 2017-05-09 DIAGNOSIS — S41101A Unspecified open wound of right upper arm, initial encounter: Secondary | ICD-10-CM

## 2017-05-09 LAB — RAPID HIV SCREEN (HIV 1/2 AB+AG)
HIV 1/2 Antibodies: NONREACTIVE
HIV-1 P24 Antigen - HIV24: NONREACTIVE

## 2017-05-09 MED ORDER — METHIMAZOLE 10 MG PO TABS: 20.0000 mg | ORAL_TABLET | Freq: Two times a day (BID) | ORAL | 1 refills | Status: DC

## 2017-05-09 MED ORDER — BUDESONIDE-FORMOTEROL FUMARATE 160-4.5 MCG/ACT IN AERO: 2.0000 | INHALATION_SPRAY | Freq: Two times a day (BID) | RESPIRATORY_TRACT | 3 refills | Status: DC

## 2017-05-09 MED ORDER — POTASSIUM CHLORIDE CRYS ER 20 MEQ PO TBCR: 20.0000 meq | EXTENDED_RELEASE_TABLET | Freq: Every day | ORAL | 1 refills | Status: DC

## 2017-05-09 MED ORDER — METOPROLOL SUCCINATE ER 200 MG PO TB24: 200.0000 mg | ORAL_TABLET | Freq: Every day | ORAL | 1 refills | Status: DC

## 2017-05-09 MED ORDER — FUROSEMIDE 40 MG PO TABS: 40.0000 mg | ORAL_TABLET | Freq: Every day | ORAL | 1 refills | Status: DC | PRN

## 2017-05-09 MED ORDER — DIGOXIN 125 MCG PO TABS: 0.1250 mg | ORAL_TABLET | Freq: Every day | ORAL | 1 refills | Status: DC

## 2017-05-09 MED ORDER — SPIRONOLACTONE 25 MG PO TABS: 12.5000 mg | ORAL_TABLET | Freq: Every day | ORAL | 1 refills | Status: DC

## 2017-05-09 MED ORDER — APIXABAN 5 MG PO TABS: 5.0000 mg | ORAL_TABLET | Freq: Two times a day (BID) | ORAL | 1 refills | Status: DC

## 2017-05-09 NOTE — Assessment & Plan Note (Signed)
Not under great control. Recheck 2 weeks- take his medicine as prescribed.

## 2017-05-09 NOTE — Assessment & Plan Note (Signed)
Has not seen endocrinology. Referral to endocrine put back in today. Rechecking levels today. Call with any concerns.

## 2017-05-09 NOTE — Assessment & Plan Note (Signed)
Overdue for follow up with CHF clinic- will get him back in. Refills of medicines given today.

## 2017-05-09 NOTE — Progress Notes (Signed)
BP (!) 154/95   Pulse (!) 116   Temp 98.3 F (36.8 C)   Wt 280 lb 4 oz (127.1 kg)   SpO2 99%   BMI 38.01 kg/m    Subjective:    Patient ID: Eugene Henderson, male    DOB: [birth year], 66 y.o.   MRN: [id number]  HPI: Eugene Henderson is a 66 y.o. male  Chief Complaint  Patient presents with  . Eye Pain  . Hepatitis C   EYE PAIN- no better with the antibiotics.  Duration:  About a month Involved eye:  right Onset: sudden Severity: severe  Quality: aching Foreign body sensation:yes Visual impairment: yes Eye redness: yes Discharge: yes Crusting or matting of eyelids: yes Swelling: yes Photophobia: yes Itching: no Tearing: yes Headache: no Floaters: no URI symptoms: no Contact lens use: no Close contacts with similar problems: no Eye trauma: no Status: stable Treatments attempted: gentamycin and naphcon-a started on 05/06/17  HEPATITIS C Duration since diagnosis: chronic Hep C transmission: IV drug use Genotype: 1a Viral load: 9,330,000 IU/mL Hepatology evaluation:yes >10 years ago Liver biopsy:no  Cirrhosis: no Antiviral therapy:yes- did not complete due to moving Hepatocellular carcinoma screening: yes Esophageal varices screening/EGD: no Hepatitis A Vaccine: unknown Hepatitis B Vaccine: Up to Date Pneumovax Vaccine: Up to Date  HYPERTHYROIDISM Thyroid control status:stable Satisfied with current treatment? yes Medication side effects: no Medication compliance: poor compliance Etiology of hypothyroidism:  Recent dose adjustment:no Fatigue: no Cold intolerance: no Heat intolerance: no Weight gain: yes Weight loss: no Constipation: no Diarrhea/loose stools: no Palpitations: no Lower extremity edema: no Anxiety/depressed mood: no   Relevant past medical, surgical, family and social history reviewed and updated as indicated. Interim medical history since our last visit reviewed. Allergies and medications reviewed and updated.  Review of Systems   Constitutional: Negative.   HENT: Negative.   Eyes: Positive for photophobia, pain, discharge, redness and visual disturbance. Negative for itching.  Respiratory: Negative.   Cardiovascular: Negative.   Neurological: Negative.   Psychiatric/Behavioral: Negative.     Per HPI unless specifically indicated above     Objective:    BP (!) 154/95   Pulse (!) 116   Temp 98.3 F (36.8 C)   Wt 280 lb 4 oz (127.1 kg)   SpO2 99%   BMI 38.01 kg/m   Wt Readings from Last 3 Encounters:  05/09/17 280 lb 4 oz (127.1 kg)  05/06/17 265 lb (120.2 kg)  02/24/17 268 lb (121.6 kg)    Physical Exam  Constitutional: He is oriented to person, place, and time. He appears well-developed and well-nourished. No distress.  HENT:  Head: Normocephalic and atraumatic.  Right Ear: Hearing normal.  Left Ear: Hearing normal.  Nose: Nose normal.  Eyes: Right eye exhibits discharge and exudate. Right eye exhibits no chemosis and no hordeolum. No foreign body present in the right eye. Left eye exhibits no discharge. Right conjunctiva is injected. Right conjunctiva has no hemorrhage. Left conjunctiva is not injected. Left conjunctiva has no hemorrhage. No scleral icterus. Right eye exhibits abnormal extraocular motion and nystagmus. Left eye exhibits normal extraocular motion and no nystagmus. Right pupil is round and reactive. Left pupil is round and reactive. Pupils are equal.  Cardiovascular: Normal rate, regular rhythm, normal heart sounds and intact distal pulses. Exam reveals no gallop and no friction rub.  No murmur heard. Pulmonary/Chest: Effort normal and breath sounds normal. No respiratory distress. He has no wheezes. He has no rales. He exhibits no tenderness.  Musculoskeletal:  Normal range of motion.  Neurological: He is alert and oriented to person, place, and time.  Skin: Skin is warm, dry and intact. No rash noted. He is not diaphoretic. No erythema. No pallor.  Open wound of R arm- healing well.    Psychiatric: He has a normal mood and affect. His speech is normal and behavior is normal. Judgment and thought content normal. Cognition and memory are normal.  Nursing note and vitals reviewed.   Results for orders placed or performed in visit on 12/15/16  HCV RNA quant  Result Value Ref Range   Hepatitis C Quantitation 9,330,000 IU/mL   HCV log10 6.970 log10 IU/mL   Test Information Comment   Hepatitis C Genotype  Result Value Ref Range   Hepatitis C Genotype 1a    Please Note (HCV): Comment   Hepatitis B Surface AntiBODY  Result Value Ref Range   Hep B Surface Ab, Qual Reactive   Hepatitis B Surface AntiGEN  Result Value Ref Range   Hepatitis B Surface Ag Negative Negative  Basic metabolic panel  Result Value Ref Range   Glucose 146 (H) 65 - 99 mg/dL   BUN 10 8 - 27 mg/dL   Creatinine, Ser 1.61 0.76 - 1.27 mg/dL   GFR calc non Af Amer 86 >59 mL/min/1.73   GFR calc Af Amer 99 >59 mL/min/1.73   BUN/Creatinine Ratio 11 10 - 24   Sodium 135 134 - 144 mmol/L   Potassium 4.2 3.5 - 5.2 mmol/L   Chloride 99 96 - 106 mmol/L   CO2 23 20 - 29 mmol/L   Calcium 9.4 8.6 - 10.2 mg/dL  Specimen status report  Result Value Ref Range   specimen status report Comment       Assessment & Plan:   Problem List Items Addressed This Visit      Cardiovascular and Mediastinum   Chronic systolic heart failure (HCC) (Chronic)    Overdue for follow up with CHF clinic- will get him back in. Refills of medicines given today.      Relevant Medications   apixaban (ELIQUIS) 5 MG TABS tablet   digoxin (LANOXIN) 0.125 MG tablet   furosemide (LASIX) 40 MG tablet   metoprolol (TOPROL-XL) 200 MG 24 hr tablet   spironolactone (ALDACTONE) 25 MG tablet   Other Relevant Orders   CBC with Differential/Platelet   Comprehensive metabolic panel   Lipid Panel w/o Chol/HDL Ratio   Essential hypertension    Not under great control. Recheck 2 weeks- take his medicine as prescribed.       Relevant  Medications   apixaban (ELIQUIS) 5 MG TABS tablet   digoxin (LANOXIN) 0.125 MG tablet   furosemide (LASIX) 40 MG tablet   metoprolol (TOPROL-XL) 200 MG 24 hr tablet   spironolactone (ALDACTONE) 25 MG tablet   Other Relevant Orders   CBC with Differential/Platelet   Comprehensive metabolic panel   Lipid Panel w/o Chol/HDL Ratio   AAA (abdominal aortic aneurysm) (HCC)    Not due to follow up with vascular until March.       Relevant Medications   apixaban (ELIQUIS) 5 MG TABS tablet   digoxin (LANOXIN) 0.125 MG tablet   furosemide (LASIX) 40 MG tablet   metoprolol (TOPROL-XL) 200 MG 24 hr tablet   spironolactone (ALDACTONE) 25 MG tablet     Digestive   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)    Has not seen GI. New referral generated today. Will try to get him in  ASAP.       Relevant Orders   Ambulatory referral to Gastroenterology   HCV RNA quant   Hepatitis A antibody, IgM     Endocrine   Hyperthyroidism    Has not seen endocrinology. Referral to endocrine put back in today. Rechecking levels today. Call with any concerns.       Relevant Medications   methimazole (TAPAZOLE) 10 MG tablet   metoprolol (TOPROL-XL) 200 MG 24 hr tablet   Other Relevant Orders   CBC with Differential/Platelet   Comprehensive metabolic panel   Lipid Panel w/o Chol/HDL Ratio   TSH   Ambulatory referral to Endocrinology     Other   Hyponatremia    Rechecking levels today. Await results. Treat as needed.       Relevant Orders   CBC with Differential/Platelet   Comprehensive metabolic panel    Other Visit Diagnoses    Pain of right eye    -  Primary   Seeing opthalmology at 1:30 this PM. Will head straight there.   Assistance needed with transportation       Daughter drives him and often he can't make appointments. Referral to [facility] made today.   Relevant Orders   Ambulatory referral to [facility]   Immunization due       Flu and Td given today.    Relevant Orders   Flu  vaccine HIGH DOSE PF (Fluzone High dose) (Completed)   Open wound of right upper arm, initial encounter       Healing well, Due for td. Given today.       Follow up plan: Return for REschedule AWV on 12/12 when daughter comes in.

## 2017-05-09 NOTE — Assessment & Plan Note (Signed)
Not due to follow up with vascular until March.

## 2017-05-09 NOTE — Assessment & Plan Note (Signed)
Has not seen GI. New referral generated today. Will try to get him in ASAP.

## 2017-05-09 NOTE — Assessment & Plan Note (Signed)
Rechecking levels today. Await results. Treat as needed.  

## 2017-05-10 LAB — RPR, QUANT+TP ABS (REFLEX): T Pallidum Abs: POSITIVE — AB

## 2017-05-10 LAB — RPR: RPR: REACTIVE — AB

## 2017-05-11 ENCOUNTER — Other Ambulatory Visit
Admission: RE | Admit: 2017-05-11 | Discharge: 2017-05-11 | Disposition: A | Discharge status: Home / Self Care | Payer: Medicare (Managed Care) | Source: Ambulatory Visit | Attending: Ophthalmology | Admitting: Ophthalmology

## 2017-05-11 DIAGNOSIS — H16009 Unspecified corneal ulcer, unspecified eye: Secondary | ICD-10-CM | POA: Diagnosis present

## 2017-05-11 DIAGNOSIS — H16001 Unspecified corneal ulcer, right eye: Secondary | ICD-10-CM | POA: Diagnosis not present

## 2017-05-11 LAB — COMPREHENSIVE METABOLIC PANEL
A/G RATIO: 1.2 (ref 1.2–2.2)
ALBUMIN: 4.2 g/dL (ref 3.6–4.8)
ALT: 61 IU/L — ABNORMAL HIGH (ref 0–44)
AST: 51 IU/L — ABNORMAL HIGH (ref 0–40)
Alkaline Phosphatase: 122 IU/L — ABNORMAL HIGH (ref 39–117)
BILIRUBIN TOTAL: 1.2 mg/dL (ref 0.0–1.2)
BUN / CREAT RATIO: 12 (ref 10–24)
BUN: 10 mg/dL (ref 8–27)
CALCIUM: 9.1 mg/dL (ref 8.6–10.2)
CHLORIDE: 101 mmol/L (ref 96–106)
CO2: 22 mmol/L (ref 20–29)
Creatinine, Ser: 0.83 mg/dL (ref 0.76–1.27)
GFR, EST AFRICAN AMERICAN: 106 mL/min/{1.73_m2} (ref >59)
GFR, EST NON AFRICAN AMERICAN: 92 mL/min/{1.73_m2} (ref >59)
Globulin, Total: 3.5 g/dL (ref 1.5–4.5)
Glucose: 133 mg/dL — ABNORMAL HIGH (ref 65–99)
POTASSIUM: 4.6 mmol/L (ref 3.5–5.2)
Sodium: 139 mmol/L (ref 134–144)
TOTAL PROTEIN: 7.7 g/dL (ref 6.0–8.5)

## 2017-05-11 LAB — CBC WITH DIFFERENTIAL/PLATELET
BASOS: 1 %
Basophils Absolute: 0 10*3/uL (ref 0.0–0.2)
EOS (ABSOLUTE): 0.1 10*3/uL (ref 0.0–0.4)
EOS: 2 %
HEMOGLOBIN: 14 g/dL (ref 13.0–17.7)
Hematocrit: 41.7 % (ref 37.5–51.0)
IMMATURE GRANS (ABS): 0 10*3/uL (ref 0.0–0.1)
Immature Granulocytes: 0 %
LYMPHS: 34 %
Lymphocytes Absolute: 1.5 10*3/uL (ref 0.7–3.1)
MCH: 28.5 pg (ref 26.6–33.0)
MCHC: 33.6 g/dL (ref 31.5–35.7)
MCV: 85 fL (ref 79–97)
MONOCYTES: 12 %
Monocytes Absolute: 0.5 10*3/uL (ref 0.1–0.9)
NEUTROS ABS: 2.2 10*3/uL (ref 1.4–7.0)
Neutrophils: 51 %
Platelets: 183 10*3/uL (ref 150–379)
RBC: 4.92 x10E6/uL (ref 4.14–5.80)
RDW: 16.8 % — ABNORMAL HIGH (ref 12.3–15.4)
WBC: 4.3 10*3/uL (ref 3.4–10.8)

## 2017-05-11 LAB — LIPID PANEL W/O CHOL/HDL RATIO
Cholesterol, Total: 134 mg/dL (ref 100–199)
HDL: 67 mg/dL (ref >39)
LDL Calculated: 51 mg/dL (ref 0–99)
Triglycerides: 80 mg/dL (ref 0–149)
VLDL CHOLESTEROL CAL: 16 mg/dL (ref 5–40)

## 2017-05-11 LAB — QUANTIFERON-TB GOLD PLUS (RQFGPL)
QUANTIFERON NIL VALUE: 0.05 [IU]/mL
QuantiFERON TB1 Ag Value: 0.08 IU/mL
QuantiFERON TB2 Ag Value: 0.1 IU/mL

## 2017-05-11 LAB — HCV RNA QUANT

## 2017-05-11 LAB — QUANTIFERON-TB GOLD PLUS: QUANTIFERON-TB GOLD PLUS: NEGATIVE

## 2017-05-11 LAB — HCV RNA (INTERNATIONAL UNITS)
HCV RNA (International Units): 12400000 IU/mL
HCV log10: 7.093 log10 IU/mL

## 2017-05-11 LAB — TSH: TSH: 0.424 u[IU]/mL — AB (ref 0.450–4.500)

## 2017-05-13 ENCOUNTER — Emergency Department: Payer: Medicare (Managed Care)

## 2017-05-13 ENCOUNTER — Encounter: Payer: Self-pay | Admitting: Emergency Medicine

## 2017-05-13 ENCOUNTER — Inpatient Hospital Stay
Admission: EM | Admit: 2017-05-13 | Discharge: 2017-05-16 | DRG: 392 | Disposition: A | Discharge status: Home / Self Care | Payer: Medicare (Managed Care) | Attending: Surgery | Admitting: Surgery

## 2017-05-13 DIAGNOSIS — I481 Persistent atrial fibrillation: Secondary | ICD-10-CM | POA: Diagnosis present

## 2017-05-13 DIAGNOSIS — I480 Paroxysmal atrial fibrillation: Secondary | ICD-10-CM | POA: Diagnosis present

## 2017-05-13 DIAGNOSIS — Z7901 Long term (current) use of anticoagulants: Secondary | ICD-10-CM

## 2017-05-13 DIAGNOSIS — K5720 Diverticulitis of large intestine with perforation and abscess without bleeding: Secondary | ICD-10-CM | POA: Diagnosis not present

## 2017-05-13 DIAGNOSIS — F17210 Nicotine dependence, cigarettes, uncomplicated: Secondary | ICD-10-CM | POA: Diagnosis present

## 2017-05-13 DIAGNOSIS — H409 Unspecified glaucoma: Secondary | ICD-10-CM | POA: Diagnosis present

## 2017-05-13 DIAGNOSIS — M1990 Unspecified osteoarthritis, unspecified site: Secondary | ICD-10-CM | POA: Diagnosis present

## 2017-05-13 DIAGNOSIS — K5780 Diverticulitis of intestine, part unspecified, with perforation and abscess without bleeding: Secondary | ICD-10-CM

## 2017-05-13 DIAGNOSIS — K5792 Diverticulitis of intestine, part unspecified, without perforation or abscess without bleeding: Secondary | ICD-10-CM

## 2017-05-13 DIAGNOSIS — E669 Obesity, unspecified: Secondary | ICD-10-CM | POA: Diagnosis present

## 2017-05-13 DIAGNOSIS — Z791 Long term (current) use of non-steroidal anti-inflammatories (NSAID): Secondary | ICD-10-CM

## 2017-05-13 DIAGNOSIS — I110 Hypertensive heart disease with heart failure: Secondary | ICD-10-CM | POA: Diagnosis present

## 2017-05-13 DIAGNOSIS — I959 Hypotension, unspecified: Secondary | ICD-10-CM

## 2017-05-13 DIAGNOSIS — Z6836 Body mass index (BMI) 36.0-36.9, adult: Secondary | ICD-10-CM | POA: Diagnosis not present

## 2017-05-13 DIAGNOSIS — I5022 Chronic systolic (congestive) heart failure: Secondary | ICD-10-CM | POA: Diagnosis present

## 2017-05-13 DIAGNOSIS — H16001 Unspecified corneal ulcer, right eye: Secondary | ICD-10-CM | POA: Diagnosis not present

## 2017-05-13 DIAGNOSIS — E0590 Thyrotoxicosis, unspecified without thyrotoxic crisis or storm: Secondary | ICD-10-CM | POA: Diagnosis present

## 2017-05-13 DIAGNOSIS — Z841 Family history of disorders of kidney and ureter: Secondary | ICD-10-CM

## 2017-05-13 DIAGNOSIS — Z8249 Family history of ischemic heart disease and other diseases of the circulatory system: Secondary | ICD-10-CM | POA: Diagnosis not present

## 2017-05-13 DIAGNOSIS — N179 Acute kidney failure, unspecified: Secondary | ICD-10-CM | POA: Diagnosis not present

## 2017-05-13 DIAGNOSIS — H1031 Unspecified acute conjunctivitis, right eye: Secondary | ICD-10-CM | POA: Diagnosis present

## 2017-05-13 DIAGNOSIS — R109 Unspecified abdominal pain: Secondary | ICD-10-CM | POA: Diagnosis not present

## 2017-05-13 DIAGNOSIS — I509 Heart failure, unspecified: Secondary | ICD-10-CM | POA: Diagnosis not present

## 2017-05-13 DIAGNOSIS — I4891 Unspecified atrial fibrillation: Secondary | ICD-10-CM | POA: Diagnosis not present

## 2017-05-13 DIAGNOSIS — Z7951 Long term (current) use of inhaled steroids: Secondary | ICD-10-CM | POA: Diagnosis not present

## 2017-05-13 DIAGNOSIS — M25511 Pain in right shoulder: Secondary | ICD-10-CM | POA: Diagnosis present

## 2017-05-13 DIAGNOSIS — J449 Chronic obstructive pulmonary disease, unspecified: Secondary | ICD-10-CM | POA: Diagnosis present

## 2017-05-13 HISTORY — DX: Diverticulitis of large intestine with perforation and abscess without bleeding: K57.20

## 2017-05-13 LAB — BASIC METABOLIC PANEL
Anion gap: 15 (ref 5–15)
BUN: 32 mg/dL — ABNORMAL HIGH (ref 6–20)
CALCIUM: 9 mg/dL (ref 8.9–10.3)
CO2: 27 mmol/L (ref 22–32)
CREATININE: 1.76 mg/dL — AB (ref 0.61–1.24)
Chloride: 84 mmol/L — ABNORMAL LOW (ref 101–111)
GFR calc non Af Amer: 39 mL/min — ABNORMAL LOW (ref >60)
GFR, EST AFRICAN AMERICAN: 45 mL/min — AB (ref >60)
Glucose, Bld: 160 mg/dL — ABNORMAL HIGH (ref 65–99)
Potassium: 3.7 mmol/L (ref 3.5–5.1)
SODIUM: 126 mmol/L — AB (ref 135–145)

## 2017-05-13 LAB — MAGNESIUM: MAGNESIUM: 1.7 mg/dL (ref 1.7–2.4)

## 2017-05-13 LAB — HEPATIC FUNCTION PANEL
ALT: 53 U/L (ref 17–63)
AST: 58 U/L — AB (ref 15–41)
Albumin: 4.2 g/dL (ref 3.5–5.0)
Alkaline Phosphatase: 115 U/L (ref 38–126)
BILIRUBIN DIRECT: 0.6 mg/dL — AB (ref 0.1–0.5)
Indirect Bilirubin: 1.8 mg/dL — ABNORMAL HIGH (ref 0.3–0.9)
Total Bilirubin: 2.4 mg/dL — ABNORMAL HIGH (ref 0.3–1.2)
Total Protein: 8.3 g/dL — ABNORMAL HIGH (ref 6.5–8.1)

## 2017-05-13 LAB — TROPONIN I: TROPONIN I: 0.04 ng/mL — AB (ref <0.03)

## 2017-05-13 LAB — LIPASE, BLOOD: LIPASE: 18 U/L (ref 11–51)

## 2017-05-13 LAB — CBC
HCT: 48.4 % (ref 40.0–52.0)
Hemoglobin: 16.3 g/dL (ref 13.0–18.0)
MCH: 28.4 pg (ref 26.0–34.0)
MCHC: 33.6 g/dL (ref 32.0–36.0)
MCV: 84.4 fL (ref 80.0–100.0)
PLATELETS: 183 10*3/uL (ref 150–440)
RBC: 5.74 MIL/uL (ref 4.40–5.90)
RDW: 16.7 % — AB (ref 11.5–14.5)
WBC: 9.6 10*3/uL (ref 3.8–10.6)

## 2017-05-13 MED ORDER — PIPERACILLIN-TAZOBACTAM 3.375 G IVPB: 3.3750 g | Freq: Three times a day (TID) | INTRAVENOUS | Status: DC

## 2017-05-13 MED ORDER — ALBUTEROL SULFATE (2.5 MG/3ML) 0.083% IN NEBU
2.5000 mg | INHALATION_SOLUTION | Freq: Four times a day (QID) | RESPIRATORY_TRACT | Status: DC | PRN
  Administered 2017-05-16: 2.5 mg via RESPIRATORY_TRACT
  Filled 2017-05-13: qty 3

## 2017-05-13 MED ORDER — ACETAMINOPHEN 500 MG PO TABS: 1000.0000 mg | ORAL_TABLET | Freq: Four times a day (QID) | ORAL | Status: DC | PRN

## 2017-05-13 MED ORDER — METOPROLOL TARTRATE 5 MG/5ML IV SOLN
5.0000 mg | Freq: Four times a day (QID) | INTRAVENOUS | Status: DC
  Administered 2017-05-14: 5 mg via INTRAVENOUS
  Filled 2017-05-13: qty 5

## 2017-05-13 MED ORDER — PANTOPRAZOLE SODIUM 40 MG IV SOLR
40.0000 mg | Freq: Every day | INTRAVENOUS | Status: DC
  Administered 2017-05-14 – 2017-05-15 (×3): 40 mg via INTRAVENOUS
  Filled 2017-05-13 (×3): qty 40

## 2017-05-13 MED ORDER — SODIUM CHLORIDE 0.9 % IV BOLUS (SEPSIS)
250.0000 mL | Freq: Once | INTRAVENOUS | Status: AC
  Administered 2017-05-13: 250 mL via INTRAVENOUS

## 2017-05-13 MED ORDER — HEPARIN SODIUM (PORCINE) 5000 UNIT/ML IJ SOLN
5000.0000 [IU] | Freq: Three times a day (TID) | INTRAMUSCULAR | Status: DC
  Administered 2017-05-14 – 2017-05-16 (×7): 5000 [IU] via SUBCUTANEOUS
  Filled 2017-05-13 (×7): qty 1

## 2017-05-13 MED ORDER — ONDANSETRON 4 MG PO TBDP: 4.0000 mg | ORAL_TABLET | Freq: Four times a day (QID) | ORAL | Status: DC | PRN

## 2017-05-13 MED ORDER — DIGOXIN 125 MCG PO TABS
0.1250 mg | ORAL_TABLET | Freq: Every day | ORAL | Status: DC
  Administered 2017-05-14 – 2017-05-16 (×3): 0.125 mg via ORAL
  Filled 2017-05-13 (×3): qty 1

## 2017-05-13 MED ORDER — SODIUM CHLORIDE 0.9 % IV BOLUS (SEPSIS): 500.0000 mL | Freq: Once | INTRAVENOUS | Status: DC

## 2017-05-13 MED ORDER — HYDROMORPHONE HCL 1 MG/ML IJ SOLN
0.5000 mg | INTRAMUSCULAR | Status: DC | PRN
Start: 2017-05-13 — End: 2017-05-16
  Administered 2017-05-14 – 2017-05-15 (×4): 0.5 mg via INTRAVENOUS
  Filled 2017-05-13 (×4): qty 0.5

## 2017-05-13 MED ORDER — POLYETHYLENE GLYCOL 3350 17 G PO PACK: 17.0000 g | PACK | Freq: Every day | ORAL | Status: DC | PRN

## 2017-05-13 MED ORDER — FENTANYL CITRATE (PF) 100 MCG/2ML IJ SOLN
50.0000 ug | Freq: Once | INTRAMUSCULAR | Status: AC
  Administered 2017-05-13: 50 ug via INTRAVENOUS
  Filled 2017-05-13: qty 2

## 2017-05-13 MED ORDER — PIPERACILLIN-TAZOBACTAM 3.375 G IVPB 30 MIN
3.3750 g | Freq: Once | INTRAVENOUS | Status: AC
  Administered 2017-05-13: 3.375 g via INTRAVENOUS
  Filled 2017-05-13: qty 50

## 2017-05-13 MED ORDER — MOMETASONE FURO-FORMOTEROL FUM 200-5 MCG/ACT IN AERO
2.0000 | INHALATION_SPRAY | Freq: Two times a day (BID) | RESPIRATORY_TRACT | Status: DC
  Administered 2017-05-14 – 2017-05-16 (×5): 2 via RESPIRATORY_TRACT
  Filled 2017-05-13: qty 8.8

## 2017-05-13 MED ORDER — METHIMAZOLE 10 MG PO TABS
20.0000 mg | ORAL_TABLET | Freq: Two times a day (BID) | ORAL | Status: DC
  Administered 2017-05-14 – 2017-05-16 (×5): 20 mg via ORAL
  Filled 2017-05-13 (×7): qty 2

## 2017-05-13 MED ORDER — SODIUM CHLORIDE 0.9 % IV SOLN
INTRAVENOUS | Status: DC
  Administered 2017-05-14: via INTRAVENOUS

## 2017-05-13 MED ORDER — ONDANSETRON HCL 4 MG/2ML IJ SOLN: 4.0000 mg | Freq: Four times a day (QID) | INTRAMUSCULAR | Status: DC | PRN

## 2017-05-13 MED ORDER — TOBRAMYCIN 0.3 % OP SOLN
1.0000 [drp] | OPHTHALMIC | Status: DC
  Filled 2017-05-13: qty 5

## 2017-05-13 MED ORDER — KETOROLAC TROMETHAMINE 15 MG/ML IJ SOLN
15.0000 mg | Freq: Four times a day (QID) | INTRAMUSCULAR | Status: DC | PRN
  Administered 2017-05-14 – 2017-05-16 (×7): 15 mg via INTRAVENOUS
  Filled 2017-05-13 (×8): qty 1

## 2017-05-13 NOTE — H&P (Addendum)
Date of Admission:  05/13/2017  Reason for Admission:  Diverticulitis with perforation  History of Present Illness: Eugene Henderson is a 66 y.o. male who presents with a one day history of abdominal pain with some difficulty breathing.  The patient has a history of CHF, COPD, CKD, afib, HTN, and reports that yesterday started having some mild shortness of breath with exertion, but he felt that it was more related to some discomfort he was having in his abdomen.  He also has been having right shoulder pain, but apparently just recently had a vaccination on that shoulder. Today, the symptoms got worse and felt he could not take a deep breath.  He also had two episodes of diarrhea in the morning and afternoon.  He presented to his PCP's office this afternoon and was told to come to ED for further evaluation.  He had U/S, CXR, and CT scan which overall have shown no acute pulmonary disease with stable cardiomegaly, cholelithiasis without evidence of cholecystitis, and sigmoid diverticulitis with an contained perforation without abscess.  Denies any fevers but reports having cold sweats today.  Denies other chest pain or shortness of breath.  Denies any nausea or vomiting.  Past Medical History: Past Medical History:  Diagnosis Date  . Arthritis   . Asthma   . CHF (congestive heart failure) (HCC)    a. EF 25-30% by echo in 07/2016 with cath showing no significant CAD b. 01/2017: EF 30-35% with diffuse HK and moderate MR  . Hypertension   . Hyperthyroidism   . Noncompliance   . Persistent atrial fibrillation [facility])      Past Surgical History: Past Surgical History:  Procedure Laterality Date  . JOINT REPLACEMENT Right   . RIGHT/LEFT HEART CATH AND CORONARY ANGIOGRAPHY N/A 08/02/2016   Procedure: Right/Left Heart Cath and Coronary Angiography;  Surgeon: Iran Ouch, MD;  Location: [facility] INVASIVE CV LAB;  Service: Cardiovascular;  Laterality: N/A;  . TESTICLE SURGERY     Patient states that he  had to have the tube fixed.    Home Medications: Prior to Admission medications   Medication Sig Start Date End Date Taking? Authorizing Provider  apixaban (ELIQUIS) 5 MG TABS tablet Take 1 tablet (5 mg total) by mouth 2 (two) times daily. 05/09/17  Yes Stimson, Megan P, DO  budesonide-formoterol (SYMBICORT) 160-4.5 MCG/ACT inhaler Inhale 2 puffs into the lungs 2 (two) times daily. 05/09/17  Yes Brummond, Megan P, DO  cyclobenzaprine (FLEXERIL) 10 MG tablet TAKE 1 TABLET BY MOUTH EVERYDAY AT BEDTIME 03/28/17  Yes Cuaresma, Megan P, DO  digoxin (LANOXIN) 0.125 MG tablet Take 1 tablet (0.125 mg total) by mouth daily. 05/09/17  Yes Arredondo, Megan P, DO  furosemide (LASIX) 40 MG tablet Take 1 tablet (40 mg total) by mouth daily as needed. For SOB/LE edema 05/09/17  Yes Woldt, Megan P, DO  gentamicin (GARAMYCIN) 0.3 % ophthalmic solution Place 1 drop into both eyes every 4 (four) hours. 05/06/17  Yes Joni Reining, PA-C  methimazole (TAPAZOLE) 10 MG tablet Take 2 tablets (20 mg total) by mouth 2 (two) times daily. 05/09/17  Yes Fuhriman, Megan P, DO  metoprolol (TOPROL-XL) 200 MG 24 hr tablet Take 1 tablet (200 mg total) by mouth daily. Take with or immediately following a meal. 05/09/17  Yes Wellons, Megan P, DO  naproxen sodium (ANAPROX) 220 MG tablet Take 220 mg by mouth 2 (two) times daily with a meal.   Yes [provider]  potassium chloride SA (K-DUR,KLOR-CON) 20  MEQ tablet Take 1 tablet (20 mEq total) by mouth daily. 05/09/17  Yes Metheny, Megan P, DO  sacubitril-valsartan (ENTRESTO) 24-26 MG Take 1 tablet by mouth 2 (two) times daily.   Yes [provider]  spironolactone (ALDACTONE) 25 MG tablet Take 0.5 tablets (12.5 mg total) by mouth daily. 05/09/17  Yes Brann, Megan P, DO  acetaminophen (TYLENOL) 500 MG tablet Take 500 mg by mouth every 6 (six) hours as needed.    [provider]  albuterol (PROVENTIL HFA;VENTOLIN HFA) 108 (90 Base) MCG/ACT inhaler Inhale 2 puffs  into the lungs every 6 (six) hours as needed for wheezing or shortness of breath. 02/03/17   Mirando, Megan P, DO  feeding supplement (BOOST / RESOURCE BREEZE) LIQD Take 1 Container by mouth 2 (two) times daily between meals. 11/26/16   Delfino LovettShah, Vipul, MD  naphazoline-pheniramine (NAPHCON-A) 0.025-0.3 % ophthalmic solution Place 1 drop into both eyes 4 (four) times daily as needed for eye irritation. Patient not taking: Reported on 05/13/2017 05/06/17   Joni ReiningSmith, Ronald K, PA-C  sacubitril-valsartan (ENTRESTO) 49-51 MG Take 1 tablet by mouth 2 (two) times daily. Patient not taking: Reported on 05/13/2017 02/21/17   Delma FreezeHackney, Tina A, FNP    Allergies: No Known Allergies  Social History:  reports that he has been smoking cigarettes.  He has a 6.25 pack-year smoking history. he has never used smokeless tobacco. He reports that he drinks alcohol. He reports that he does not use drugs.   Family History: Family History  Problem Relation Age of Onset  . Diabetes Mother   . Diabetes Father   . Heart disease Brother   . Heart attack Brother   . Diabetes Brother   . Kidney failure Brother     Review of Systems: Review of Systems  Constitutional: Positive for diaphoresis. Negative for fever.  HENT: Negative for hearing loss.   Eyes: Negative for blurred vision.  Respiratory: Positive for shortness of breath.   Cardiovascular: Negative for chest pain.  Gastrointestinal: Positive for abdominal pain and diarrhea. Negative for constipation, nausea and vomiting.  Genitourinary: Negative for dysuria.  Musculoskeletal: Positive for joint pain (right shoulder). Negative for myalgias.  Skin: Negative for rash.  Neurological: Negative for dizziness.  Psychiatric/Behavioral: Negative for depression.  All other systems reviewed and are negative.   Physical Exam BP 103/63   Pulse 73   Temp 97.6 F (36.4 C) (Oral)   Resp (!) 21   Ht 6' (1.829 m)   Wt 124.7 kg (275 lb)   SpO2 98%   BMI 37.30 kg/m   CONSTITUTIONAL: No acute distress HEENT:  Normocephalic, atraumatic, extraocular motion intact. NECK: Trachea is midline, and there is no jugular venous distension. RESPIRATORY:  Lungs are clear bilaterally without any significant wheezing or crackles. CARDIOVASCULAR: Irregular rhythm but rate controlled. GI: The abdomen is soft, obese, nondistended, with tenderness to palpation in left lower quadrant.  No peritonitis.  MUSCULOSKELETAL:  Normal muscle strength and tone in all four extremities.  No peripheral edema or cyanosis. SKIN: Skin turgor is normal. There are no pathologic skin lesions.  NEUROLOGIC:  Motor and sensation is grossly normal.  Cranial nerves are grossly intact. PSYCH:  Alert and oriented to person, place and time. Affect is normal.  Laboratory Analysis: Results for orders placed or performed during the hospital encounter of 05/13/17 (from the past 24 hour(s))  Basic metabolic panel     Status: Abnormal   Collection Time: 05/13/17  4:46 PM  Result Value Ref Range  Sodium 126 (L) 135 - 145 mmol/L   Potassium 3.7 3.5 - 5.1 mmol/L   Chloride 84 (L) 101 - 111 mmol/L   CO2 27 22 - 32 mmol/L   Glucose, Bld 160 (H) 65 - 99 mg/dL   BUN 32 (H) 6 - 20 mg/dL   Creatinine, Ser 1.61 (H) 0.61 - 1.24 mg/dL   Calcium 9.0 8.9 - 09.6 mg/dL   GFR calc non Af Amer 39 (L) >60 mL/min   GFR calc Af Amer 45 (L) >60 mL/min   Anion gap 15 5 - 15  CBC     Status: Abnormal   Collection Time: 05/13/17  4:46 PM  Result Value Ref Range   WBC 9.6 3.8 - 10.6 K/uL   RBC 5.74 4.40 - 5.90 MIL/uL   Hemoglobin 16.3 13.0 - 18.0 g/dL   HCT 04.5 40.9 - 81.1 %   MCV 84.4 80.0 - 100.0 fL   MCH 28.4 26.0 - 34.0 pg   MCHC 33.6 32.0 - 36.0 g/dL   RDW 91.4 (H) 78.2 - 95.6 %   Platelets 183 150 - 440 K/uL  Troponin I     Status: Abnormal   Collection Time: 05/13/17  4:46 PM  Result Value Ref Range   Troponin I 0.04 (HH) <0.03 ng/mL  Hepatic function panel     Status: Abnormal   Collection Time:  05/13/17  4:46 PM  Result Value Ref Range   Total Protein 8.3 (H) 6.5 - 8.1 g/dL   Albumin 4.2 3.5 - 5.0 g/dL   AST 58 (H) 15 - 41 U/L   ALT 53 17 - 63 U/L   Alkaline Phosphatase 115 38 - 126 U/L   Total Bilirubin 2.4 (H) 0.3 - 1.2 mg/dL   Bilirubin, Direct 0.6 (H) 0.1 - 0.5 mg/dL   Indirect Bilirubin 1.8 (H) 0.3 - 0.9 mg/dL  Lipase, blood     Status: None   Collection Time: 05/13/17  4:46 PM  Result Value Ref Range   Lipase 18 11 - 51 U/L  Magnesium     Status: None   Collection Time: 05/13/17  4:46 PM  Result Value Ref Range   Magnesium 1.7 1.7 - 2.4 mg/dL    Imaging: Ct Abdomen Pelvis Wo Contrast  Result Date: 05/13/2017 CLINICAL DATA:  Chest pain.  Suspected diverticulitis. EXAM: CT CHEST, ABDOMEN AND PELVIS WITHOUT CONTRAST TECHNIQUE: Multidetector CT imaging of the chest, abdomen and pelvis was performed following the standard protocol without IV contrast. COMPARISON:  CT of the abdomen and pelvis November 22, 2016 FINDINGS: CT CHEST FINDINGS Cardiovascular: The ascending thoracic aorta measures 5.3 cm on series 2, image 31. The remainder of the thoracic aorta is normal in caliber. Scattered atherosclerotic changes seen. The central pulmonary artery is normal in caliber. Coronary artery calcifications are seen in both the right and left coronary arteries. No gross cardiomegaly. Mediastinum/Nodes: No enlarged mediastinal, hilar, or axillary lymph nodes. Thyroid gland, trachea, and esophagus demonstrate no significant findings. Lungs/Pleura: Lungs are clear. No pleural effusion or pneumothorax. Musculoskeletal: See below. CT ABDOMEN PELVIS FINDINGS Hepatobiliary: No focal liver abnormality is seen. No gallstones, gallbladder wall thickening, or biliary dilatation. Pancreas: Unremarkable. No pancreatic ductal dilatation or surrounding inflammatory changes. Spleen: Normal in size without focal abnormality. Adrenals/Urinary Tract: Adrenal glands are unremarkable. Kidneys are normal, without renal  calculi, focal lesion, or hydronephrosis. Bladder is unremarkable. Stomach/Bowel: The stomach and small bowel are normal. The proximal sigmoid colon is thickened with adjacent stranding and adjacent extraluminal gas.  The collection and gas extends just superior to the colon measuring up to 2.2 cm on series 2, image 100. No associated fluid collection. There are diverticuli in this segment of thickened inflamed colon. This is the same site as diverticulitis seen in June of 2018. Other scattered diverticuli are seen. The colon is otherwise normal. The appendix is normal. Vascular/Lymphatic: The proximal abdominal aorta measures 4.4 x 4.0 cm today, unchanged. The remainder of the abdominal aorta is nonaneurysmal. 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 Atherosclerotic changes seen in the abdominal aorta, iliac vessels common femoral vessels. No adenopathy. Reproductive: Prostate is unremarkable. Other: Previous ventral wall hernia repair with mesh identified. Musculoskeletal: Severe degenerative changes in the left hip. The spine is stable. IMPRESSION: 1. 1. There is a segment of sigmoid colon which demonstrates wall thickening, diverticula, and adjacent inflammation. There is also a pocket of adjacent extraluminal gas. The constellation of findings is consistent with perforated diverticulitis. The patient also had diverticulitis at this site in June of 2018. Recommend follow-up to complete resolution. 2. The ascending thoracic aorta measures 5.3 cm proximally. Ascending thoracic aortic aneurysm. Recommend semi-annual imaging followup by CTA or MRA and referral to cardiothoracic surgery if not already obtained. This recommendation follows 2010 ACCF/AHA/AATS/ACR/ASA/SCA/SCAI/SIR/STS/SVM Guidelines for the Diagnosis and Management of Patients With Thoracic Aortic Disease. Circulation. 2010; 121: V956-L875 3. The proximal abdominal aorta measures 4.4 x 4 cm, unchanged. Recommend followup by  ultrasound in 1 year. This recommendation follows ACR consensus guidelines: White Paper of the ACR Incidental Findings Committee II on Vascular Findings. J Am Coll Radiol 2013; 10:789-794. 4. Atherosclerotic changes in the aorta and branching vessels. 5. Coronary artery calcifications. Findings called to Dr. Alphonzo Lemmings. Electronically Signed   By: Gerome Sam III M.D   On: 05/13/2017 21:11   Dg Chest 2 View  Result Date: 05/13/2017 CLINICAL DATA:  66 year old male with right-sided chest pain onset today. EXAM: CHEST  2 VIEW COMPARISON:  05/09/2017 FINDINGS: Stable cardiomegaly with slight uncoiling of the thoracic aorta. No pneumonic consolidation or CHF. No effusion or pneumothorax. Left glenohumeral joint osteoarthritis with spurring off the humeral head. No acute osseous abnormality. IMPRESSION: No active cardiopulmonary disease.  Stable cardiomegaly. Electronically Signed   By: Tollie Eth M.D.   On: 05/13/2017 17:18   Ct Chest Wo Contrast  Result Date: 05/13/2017 CLINICAL DATA:  Chest pain.  Suspected diverticulitis. EXAM: CT CHEST, ABDOMEN AND PELVIS WITHOUT CONTRAST TECHNIQUE: Multidetector CT imaging of the chest, abdomen and pelvis was performed following the standard protocol without IV contrast. COMPARISON:  CT of the abdomen and pelvis November 22, 2016 FINDINGS: CT CHEST FINDINGS Cardiovascular: The ascending thoracic aorta measures 5.3 cm on series 2, image 31. The remainder of the thoracic aorta is normal in caliber. Scattered atherosclerotic changes seen. The central pulmonary artery is normal in caliber. Coronary artery calcifications are seen in both the right and left coronary arteries. No gross cardiomegaly. Mediastinum/Nodes: No enlarged mediastinal, hilar, or axillary lymph nodes. Thyroid gland, trachea, and esophagus demonstrate no significant findings. Lungs/Pleura: Lungs are clear. No pleural effusion or pneumothorax. Musculoskeletal: See below. CT ABDOMEN PELVIS FINDINGS Hepatobiliary:  No focal liver abnormality is seen. No gallstones, gallbladder wall thickening, or biliary dilatation. Pancreas: Unremarkable. No pancreatic ductal dilatation or surrounding inflammatory changes. Spleen: Normal in size without focal abnormality. Adrenals/Urinary Tract: Adrenal glands are unremarkable. Kidneys are normal, without renal calculi, focal lesion, or hydronephrosis. Bladder is unremarkable. Stomach/Bowel: The stomach and small bowel are normal. The proximal  sigmoid colon is thickened with adjacent stranding and adjacent extraluminal gas. The collection and gas extends just superior to the colon measuring up to 2.2 cm on series 2, image 100. No associated fluid collection. There are diverticuli in this segment of thickened inflamed colon. This is the same site as diverticulitis seen in June of 2018. Other scattered diverticuli are seen. The colon is otherwise normal. The appendix is normal. Vascular/Lymphatic: The proximal abdominal aorta measures 4.4 x 4.0 cm today, unchanged. The remainder of the abdominal aorta is nonaneurysmal. 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 Atherosclerotic changes seen in the abdominal aorta, iliac vessels common femoral vessels. No adenopathy. Reproductive: Prostate is unremarkable. Other: Previous ventral wall hernia repair with mesh identified. Musculoskeletal: Severe degenerative changes in the left hip. The spine is stable. IMPRESSION: 1. 1. There is a segment of sigmoid colon which demonstrates wall thickening, diverticula, and adjacent inflammation. There is also a pocket of adjacent extraluminal gas. The constellation of findings is consistent with perforated diverticulitis. The patient also had diverticulitis at this site in June of 2018. Recommend follow-up to complete resolution. 2. The ascending thoracic aorta measures 5.3 cm proximally. Ascending thoracic aortic aneurysm. Recommend semi-annual imaging followup by CTA or MRA and  referral to cardiothoracic surgery if not already obtained. This recommendation follows 2010 ACCF/AHA/AATS/ACR/ASA/SCA/SCAI/SIR/STS/SVM Guidelines for the Diagnosis and Management of Patients With Thoracic Aortic Disease. Circulation. 2010; 121: Q034-V425 3. The proximal abdominal aorta measures 4.4 x 4 cm, unchanged. Recommend followup by ultrasound in 1 year. This recommendation follows ACR consensus guidelines: White Paper of the ACR Incidental Findings Committee II on Vascular Findings. J Am Coll Radiol 2013; 10:789-794. 4. Atherosclerotic changes in the aorta and branching vessels. 5. Coronary artery calcifications. Findings called to Dr. Alphonzo Lemmings. Electronically Signed   By: Gerome Sam III M.D   On: 05/13/2017 21:11   US Abdomen Limited Ruq  Result Date: 05/13/2017 CLINICAL DATA:  Pain. EXAM: ULTRASOUND ABDOMEN LIMITED RIGHT UPPER QUADRANT COMPARISON:  None. FINDINGS: Gallbladder: No gallstones or wall thickening visualized. No sonographic Murphy sign noted by sonographer. Common bile duct: Diameter: 4.9 mm Liver: No focal lesion identified. Within normal limits in parenchymal echogenicity. Portal vein is patent on color Doppler imaging with normal direction of blood flow towards the liver. IMPRESSION: Normal study pair Electronically Signed   By: Gerome Sam III M.D   On: 05/13/2017 20:32    Assessment and Plan: This is a 66 y.o. male who presents with acute diverticulitis with contained perforation.  I have independently viewed the patient's imaging studies and reviewed his laboratory studies.  Overall has an area of diverticulitis of sigmoid colon with contained pericolonic perforation, without any abscess.  Patient is not peritoneal.  His BP was initially soft but has improved with gentle IV hydration in ED.  Non-toxic.  His WBC is not elevated but he does have an AKI component with elevated Cr and Na/Cl changes.  Discussed with patient that he'd be admitted to the surgical team for  management of his diverticulitis.  Would start with conservative measures including NPO with gentle IV fluid hydration given his EF of 30-35%, IV antibiotics, and appropriate pain control.  Discussed that as his pain improves, we would start slowly advancing his diet.  He is aware that if he does not improve or instead worsens, that he would require to go to the OR, but at this point there is no emergent or urgent need.  Will also consult the hospitalist team  for assistance with management of his comorbidities.  Would hold his Eliquis for now and defer diuretics to hospitalist team based on his progress.  Patient understands this plan and all of his questions have been answered.   Howie Ill, MD [facility]

## 2017-05-13 NOTE — Progress Notes (Signed)
Pharmacy Antibiotic Note  Jacobs Tise is a 66 y.o. male admitted on 05/13/2017 with diverticular inflammation.  Pharmacy has been consulted for zosyn dosing.  Plan: Zosyn 3.375g IV q8h (4 hour infusion).  Height: 6' (182.9 cm) Weight: 275 lb (124.7 kg) IBW/kg (Calculated) : 77.6  Temp (24hrs), Avg:97.6 F (36.4 C), Min:97.6 F (36.4 C), Max:97.6 F (36.4 C)  Recent Labs  Lab 05/09/17 1143 05/13/17 1646  WBC 4.3 9.6  CREATININE 0.83 1.76*    Estimated Creatinine Clearance: 56.3 mL/min (A) (by C-G formula based on SCr of 1.76 mg/dL (H)).    No Known Allergies  Thank you for allowing pharmacy to be a part of this patient's care.  Thomasene Ripple, PharmD, BCPS Clinical Pharmacist 05/13/2017

## 2017-05-13 NOTE — ED Provider Notes (Addendum)
[facility] Emergency Department Provider Note  ____________________________________________   I have reviewed the triage vital signs and the nursing notes. Where available I have reviewed prior notes and, if possible and indicated, outside hospital notes.    HISTORY  Chief Complaint Chest Pain    HPI Eugene Henderson is a 66 y.o. male  who presents today complaining of multiple different things.  Patient does have a history of arthritis, asthma, CHF, hypertension, hypothyroid noncompliance atrial fibrillation COPD, alcohol abuse, hyponatremia, tobacco abuse etc.  Patient states he has had right upper quadrant abdominal pain for the last day or so.  He also states he got a tetanus shot on Monday and his shoulder has been hurting.  He also has arthritis in that shoulder.  He states he has had no fever no chills.  He had no vomiting but he did have 2 loose stools.  Denies melena or bright red blood per rectum or hematemesis.  Denies fever or chills.  The abdominal pain is nonradiating, persistent, moderate.  The shoulder pain is worse when he touches it or changes position.  He has had no fall or trauma.     Past Medical History:  Diagnosis Date  . Arthritis   . Asthma   . CHF (congestive heart failure) (HCC)    a. EF 25-30% by echo in 07/2016 with cath showing no significant CAD b. 01/2017: EF 30-35% with diffuse HK and moderate MR  . Hypertension   . Hyperthyroidism   . Noncompliance   . Persistent atrial fibrillation [facility])     Patient Active Problem List   Diagnosis Date Noted  . COPD (chronic obstructive pulmonary disease) (HCC) 02/03/2017  . 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) 12/15/2016  . AAA (abdominal aortic aneurysm) (HCC) 11/29/2016  . Chronic low back pain 11/29/2016  . Alcohol abuse 11/29/2016  . Hyponatremia 11/22/2016  . Chronic systolic heart failure (HCC) 08/19/2016  . Tobacco use 08/19/2016  . Snoring 08/19/2016  .  Persistent atrial fibrillation (HCC) 07/28/2016  . Hyperthyroidism 07/28/2016  . Noncompliance with medications 07/28/2016  . Essential hypertension 07/28/2016    Past Surgical History:  Procedure Laterality Date  . JOINT REPLACEMENT Right   . RIGHT/LEFT HEART CATH AND CORONARY ANGIOGRAPHY N/A 08/02/2016   Procedure: Right/Left Heart Cath and Coronary Angiography;  Surgeon: Iran Ouch, MD;  Location: [facility] INVASIVE CV LAB;  Service: Cardiovascular;  Laterality: N/A;  . TESTICLE SURGERY     Patient states that he had to have the tube fixed.    Prior to Admission medications   Medication Sig Start Date End Date Taking? Authorizing Provider  apixaban (ELIQUIS) 5 MG TABS tablet Take 1 tablet (5 mg total) by mouth 2 (two) times daily. 05/09/17  Yes Pelzer, Megan P, DO  budesonide-formoterol (SYMBICORT) 160-4.5 MCG/ACT inhaler Inhale 2 puffs into the lungs 2 (two) times daily. 05/09/17  Yes Hogle, Megan P, DO  cyclobenzaprine (FLEXERIL) 10 MG tablet TAKE 1 TABLET BY MOUTH EVERYDAY AT BEDTIME 03/28/17  Yes Tipps, Megan P, DO  digoxin (LANOXIN) 0.125 MG tablet Take 1 tablet (0.125 mg total) by mouth daily. 05/09/17  Yes Westerhold, Megan P, DO  furosemide (LASIX) 40 MG tablet Take 1 tablet (40 mg total) by mouth daily as needed. For SOB/LE edema 05/09/17  Yes Matin, Megan P, DO  gentamicin (GARAMYCIN) 0.3 % ophthalmic solution Place 1 drop into both eyes every 4 (four) hours. 05/06/17  Yes Joni Reining, PA-C  methimazole (TAPAZOLE) 10 MG  tablet Take 2 tablets (20 mg total) by mouth 2 (two) times daily. 05/09/17  Yes Deshaies, Megan P, DO  metoprolol (TOPROL-XL) 200 MG 24 hr tablet Take 1 tablet (200 mg total) by mouth daily. Take with or immediately following a meal. 05/09/17  Yes Coker, Megan P, DO  naproxen sodium (ANAPROX) 220 MG tablet Take 220 mg by mouth 2 (two) times daily with a meal.   Yes [provider]  potassium chloride SA (K-DUR,KLOR-CON) 20 MEQ tablet Take 1 tablet  (20 mEq total) by mouth daily. 05/09/17  Yes Koppen, Megan P, DO  sacubitril-valsartan (ENTRESTO) 24-26 MG Take 1 tablet by mouth 2 (two) times daily.   Yes [provider]  spironolactone (ALDACTONE) 25 MG tablet Take 0.5 tablets (12.5 mg total) by mouth daily. 05/09/17  Yes Elliff, Megan P, DO  acetaminophen (TYLENOL) 500 MG tablet Take 500 mg by mouth every 6 (six) hours as needed.    [provider]  albuterol (PROVENTIL HFA;VENTOLIN HFA) 108 (90 Base) MCG/ACT inhaler Inhale 2 puffs into the lungs every 6 (six) hours as needed for wheezing or shortness of breath. 02/03/17   Bahr, Megan P, DO  feeding supplement (BOOST / RESOURCE BREEZE) LIQD Take 1 Container by mouth 2 (two) times daily between meals. 11/26/16   Delfino Lovett, MD  naphazoline-pheniramine (NAPHCON-A) 0.025-0.3 % ophthalmic solution Place 1 drop into both eyes 4 (four) times daily as needed for eye irritation. Patient not taking: Reported on 05/13/2017 05/06/17   Joni Reining, PA-C  sacubitril-valsartan (ENTRESTO) 49-51 MG Take 1 tablet by mouth 2 (two) times daily. Patient not taking: Reported on 05/13/2017 02/21/17   Delma Freeze, FNP    Allergies Patient has no known allergies.  Family History  Problem Relation Age of Onset  . Diabetes Mother   . Diabetes Father   . Heart disease Brother   . Heart attack Brother   . Diabetes Brother   . Kidney failure Brother     Social History Social History   Tobacco Use  . Smoking status: Current Some Day Smoker    Packs/day: 0.25    Years: 25.00    Pack years: 6.25    Types: Cigarettes  . Smokeless tobacco: Never Used  Substance Use Topics  . Alcohol use: Yes    Comment: Socially - once a month  . Drug use: No    Review of Systems Constitutional: No fever/chills Eyes: No visual changes. ENT: No sore throat. No stiff neck no neck pain Cardiovascular: Denies chest pain. Respiratory: Denies shortness of breath. Gastrointestinal:   no vomiting.   Positive diarrhea.  No constipation. Genitourinary: Negative for dysuria. Musculoskeletal: Negative lower extremity swelling Skin: Negative for rash. Neurological: Negative for severe headaches, focal weakness or numbness.   ____________________________________________   PHYSICAL EXAM:  VITAL SIGNS: ED Triage Vitals  Enc Vitals Group     BP 05/13/17 1647 (!) 106/56     Pulse Rate 05/13/17 1647 62     Resp 05/13/17 1647 15     Temp 05/13/17 1915 97.6 F (36.4 C)     Temp Source 05/13/17 1915 Oral     SpO2 05/13/17 1647 100 %     Weight 05/13/17 1648 275 lb (124.7 kg)     Height 05/13/17 1648 6' (1.829 m)     Head Circumference --      Peak Flow --      Pain Score 05/13/17 1647 8     Pain Loc --  Pain Edu? --      Excl. in GC? --     Constitutional: Alert and oriented. Well appearing and in no acute distress. Eyes: Conjunctivae are normal Head: Atraumatic HEENT: No congestion/rhinnorhea. Mucous membranes are moist.  Oropharynx non-erythematous Neck:   Nontender with no meningismus, no masses, no stridor Cardiovascular: Normal rate, regular rhythm. Grossly normal heart sounds.  Good peripheral circulation. Respiratory: Normal respiratory effort.  No retractions. Lungs CTAB. Abdominal: Soft and tenderness noted to palpation the right upper quadrant. No distention. No guarding no rebound Back:  There is no focal tenderness or step off.  there is no midline tenderness there are no lesions noted. there is no CVA tenderness Musculoskeletal: No lower extremity tenderness, patient does have some minimal tenderness to palpation to the right shoulder when I change the position of the shoulder range it it makes the pain worse there is no erythema it is not hot to touch there is no evidence of fracture or bony instability no joint effusions, no DVT signs strong distal pulses no edema Rectal exam: Guaiac-negative brown stool Neurologic:  Normal speech and language. No gross focal  neurologic deficits are appreciated.  Skin:  Skin is warm, dry and intact. No rash noted. Psychiatric: Mood and affect are normal. Speech and behavior are normal.  ____________________________________________   LABS (all labs ordered are listed, but only abnormal results are displayed)  Labs Reviewed  BASIC METABOLIC PANEL - Abnormal; Notable for the following components:      Result Value   Sodium 126 (*)    Chloride 84 (*)    Glucose, Bld 160 (*)    BUN 32 (*)    Creatinine, Ser 1.76 (*)    GFR calc non Af Amer 39 (*)    GFR calc Af Amer 45 (*)    All other components within normal limits  CBC - Abnormal; Notable for the following components:   RDW 16.7 (*)    All other components within normal limits  TROPONIN I - Abnormal; Notable for the following components:   Troponin I 0.04 (*)    All other components within normal limits  HEPATIC FUNCTION PANEL - Abnormal; Notable for the following components:   Total Protein 8.3 (*)    AST 58 (*)    Total Bilirubin 2.4 (*)    Bilirubin, Direct 0.6 (*)    Indirect Bilirubin 1.8 (*)    All other components within normal limits  LIPASE, BLOOD  MAGNESIUM    Pertinent labs  results that were available during my care of the patient were reviewed by me and considered in my medical decision making (see chart for details). ____________________________________________  EKG  I personally interpreted any EKGs ordered by me or triage Atrial fibrillation with PVCs noted, no acute ST elevation or depression, LAD noted.  Nonspecific ST changes ____________________________________________  RADIOLOGY  Pertinent labs & imaging results that were available during my care of the patient were reviewed by me and considered in my medical decision making (see chart for details). If possible, patient and/or family made aware of any abnormal findings.  Dg Chest 2 View  Result Date: 05/13/2017 CLINICAL DATA:  66 year old male with right-sided chest  pain onset today. EXAM: CHEST  2 VIEW COMPARISON:  05/09/2017 FINDINGS: Stable cardiomegaly with slight uncoiling of the thoracic aorta. No pneumonic consolidation or CHF. No effusion or pneumothorax. Left glenohumeral joint osteoarthritis with spurring off the humeral head. No acute osseous abnormality. IMPRESSION: No active cardiopulmonary disease.  Stable  cardiomegaly. Electronically Signed   By: Tollie Ethavid  Kwon M.D.   On: 05/13/2017 17:18   [place] Abdomen Limited Ruq  Result Date: 05/13/2017 CLINICAL DATA:  Pain. EXAM: ULTRASOUND ABDOMEN LIMITED RIGHT UPPER QUADRANT COMPARISON:  None. FINDINGS: Gallbladder: No gallstones or wall thickening visualized. No sonographic Murphy sign noted by sonographer. Common bile duct: Diameter: 4.9 mm Liver: No focal lesion identified. Within normal limits in parenchymal echogenicity. Portal vein is patent on color Doppler imaging with normal direction of blood flow towards the liver. IMPRESSION: Normal study pair Electronically Signed   By: Gerome Samavid  Williams III M.D   On: 05/13/2017 20:32   ____________________________________________    PROCEDURES  Procedure(s) performed: None  Procedures  Critical Care performed: CRITICAL CARE Performed by: Jeanmarie PlantJAMES A Aniesa Boback   Total critical care time: 45 minutes  Critical care time was exclusive of separately billable procedures and treating other patients.  Critical care was necessary to treat or prevent imminent or life-threatening deterioration.  Critical care was time spent personally by me on the following activities: development of treatment plan with patient and/or surrogate as well as nursing, discussions with consultants, evaluation of patient's response to treatment, examination of patient, obtaining history from patient or surrogate, ordering and performing treatments and interventions, ordering and review of laboratory studies, ordering and review of radiographic studies, pulse oximetry and re-evaluation of patient's  condition.   ____________________________________________   INITIAL IMPRESSION / ASSESSMENT AND PLAN / ED COURSE  Pertinent labs & imaging results that were available during my care of the patient were reviewed by me and considered in my medical decision making (see chart for details).  Patient here with reproducible right upper quadrant abdominal pain, however blood pressure is running somewhat low, low 100s at this time after fluids.  Patient does have an EF of 35%, and therefore giving him ginger IV fluids which seem to be helping.  I gave him fentanyl he is pain-free.  Certainly possible shoulder pain could be referred pain although he is tender to palpation in his shoulder.  Will obtain CT scan of the chest abdomen pelvis given his abdominal pain, shoulder pain, and borderline blood pressure.  Patient does have history of noncompliance, also chronic liver issues.  Liver function tests are elevated but ultrasound is negative.  ----------------------------------------- 9:29 PM on 05/13/2017 -----------------------------------------  Patient pain well controlled after fentanyl CT scan shows perforated diverticulitis.  We are obtaining blood cultures because of transitory hypotension, surgery has been consulted they will come admit the patient. Of note, pt had a ill-fitting cuff on for hypotension reads, and putting on appropriate cuff gave numbers more consistent with clinical picture. Given hx of chf, I was not therefore overly  aggressive w/ ifvf    ____________________________________________   FINAL CLINICAL IMPRESSION(S) / ED DIAGNOSES  Final diagnoses:  Abdominal pain      This chart was dictated using voice recognition software.  Despite best efforts to proofread,  errors can occur which can change meaning.      Jeanmarie PlantMcShane, Torrey Horseman A, MD 05/13/17 2052    Jeanmarie PlantMcShane, Castle Lamons A, MD 05/13/17 2130    Jeanmarie PlantMcShane, Norine Reddington A, MD 05/14/17 [phone]

## 2017-05-13 NOTE — ED Triage Notes (Signed)
Patient presents to ED via POV from home with c/o chest pain that began at noon today while patient was walking. Patient describes the pain as a squeezing pain. EKG here shows a fib, hx of same.

## 2017-05-14 ENCOUNTER — Encounter: Payer: Self-pay | Admitting: Internal Medicine

## 2017-05-14 ENCOUNTER — Other Ambulatory Visit: Payer: Self-pay

## 2017-05-14 LAB — COMPREHENSIVE METABOLIC PANEL
ALBUMIN: 3.7 g/dL (ref 3.5–5.0)
ALK PHOS: 96 U/L (ref 38–126)
ALT: 45 U/L (ref 17–63)
AST: 43 U/L — AB (ref 15–41)
Anion gap: 14 (ref 5–15)
BUN: 35 mg/dL — AB (ref 6–20)
CALCIUM: 8.4 mg/dL — AB (ref 8.9–10.3)
CHLORIDE: 88 mmol/L — AB (ref 101–111)
CO2: 26 mmol/L (ref 22–32)
CREATININE: 1.51 mg/dL — AB (ref 0.61–1.24)
GFR calc Af Amer: 54 mL/min — ABNORMAL LOW (ref >60)
GFR calc non Af Amer: 46 mL/min — ABNORMAL LOW (ref >60)
GLUCOSE: 102 mg/dL — AB (ref 65–99)
Potassium: 3.2 mmol/L — ABNORMAL LOW (ref 3.5–5.1)
SODIUM: 128 mmol/L — AB (ref 135–145)
Total Bilirubin: 2.4 mg/dL — ABNORMAL HIGH (ref 0.3–1.2)
Total Protein: 7.4 g/dL (ref 6.5–8.1)

## 2017-05-14 LAB — CBC WITH DIFFERENTIAL/PLATELET
BASOS PCT: 0 %
Basophils Absolute: 0 10*3/uL (ref 0–0.1)
Eosinophils Absolute: 0 10*3/uL (ref 0–0.7)
Eosinophils Relative: 1 %
HEMATOCRIT: 45.6 % (ref 40.0–52.0)
HEMOGLOBIN: 15.6 g/dL (ref 13.0–18.0)
Lymphocytes Relative: 21 %
Lymphs Abs: 1.5 10*3/uL (ref 1.0–3.6)
MCH: 28.9 pg (ref 26.0–34.0)
MCHC: 34.2 g/dL (ref 32.0–36.0)
MCV: 84.6 fL (ref 80.0–100.0)
MONOS PCT: 10 %
Monocytes Absolute: 0.7 10*3/uL (ref 0.2–1.0)
NEUTROS ABS: 4.9 10*3/uL (ref 1.4–6.5)
NEUTROS PCT: 68 %
Platelets: 158 10*3/uL (ref 150–440)
RBC: 5.38 MIL/uL (ref 4.40–5.90)
RDW: 16.2 % — ABNORMAL HIGH (ref 11.5–14.5)
WBC: 7.1 10*3/uL (ref 3.8–10.6)

## 2017-05-14 LAB — BILIRUBIN, DIRECT: Bilirubin, Direct: 0.6 mg/dL — ABNORMAL HIGH (ref 0.1–0.5)

## 2017-05-14 LAB — TROPONIN I: Troponin I: 0.03 ng/mL (ref <0.03)

## 2017-05-14 LAB — MAGNESIUM: Magnesium: 1.7 mg/dL (ref 1.7–2.4)

## 2017-05-14 MED ORDER — METOPROLOL TARTRATE 50 MG PO TABS
50.0000 mg | ORAL_TABLET | Freq: Two times a day (BID) | ORAL | Status: DC
  Administered 2017-05-14 – 2017-05-15 (×3): 50 mg via ORAL
  Filled 2017-05-14 (×3): qty 1

## 2017-05-14 MED ORDER — MAGNESIUM SULFATE IN D5W 1-5 GM/100ML-% IV SOLN
1.0000 g | Freq: Once | INTRAVENOUS | Status: AC
  Administered 2017-05-14: 1 g via INTRAVENOUS
  Filled 2017-05-14: qty 100

## 2017-05-14 MED ORDER — POTASSIUM CHLORIDE CRYS ER 20 MEQ PO TBCR
40.0000 meq | EXTENDED_RELEASE_TABLET | Freq: Once | ORAL | Status: AC
  Administered 2017-05-14: 40 meq via ORAL
  Filled 2017-05-14: qty 2

## 2017-05-14 MED ORDER — POTASSIUM CHLORIDE IN NACL 20-0.9 MEQ/L-% IV SOLN
INTRAVENOUS | Status: DC
  Administered 2017-05-14 (×2): via INTRAVENOUS
  Filled 2017-05-14 (×4): qty 1000

## 2017-05-14 MED ORDER — TOBRAMYCIN 0.3 % OP OINT
TOPICAL_OINTMENT | OPHTHALMIC | Status: DC
  Administered 2017-05-14 (×2): via OPHTHALMIC
  Administered 2017-05-14: 1 via OPHTHALMIC
  Administered 2017-05-14 – 2017-05-16 (×8): via OPHTHALMIC
  Filled 2017-05-14 (×2): qty 3.5

## 2017-05-14 MED ORDER — PIPERACILLIN-TAZOBACTAM 3.375 G IVPB
3.3750 g | Freq: Three times a day (TID) | INTRAVENOUS | Status: DC
  Administered 2017-05-14 – 2017-05-16 (×7): 3.375 g via INTRAVENOUS
  Filled 2017-05-14 (×7): qty 50

## 2017-05-14 NOTE — Progress Notes (Signed)
Patient ID: Hayato Levi, male   DOB: Sep 16, 1950, 66 y.o.   MRN: [id number]       SURGICAL PROGRESS NOTE   Hospital Day(s): 1.   Post op day(s):  n/a   Interval History: Patient seen and examined, no acute events or new complaints overnight. Patient reports feeling a little bit better, denies nausea, vomiting or diarrea.  Vital signs in last 24 hours: [min-max] current  Temp:  [97.6 F (36.4 C)-98.9 F (37.2 C)] 97.7 F (36.5 C) (12/08 0848) Pulse Rate:  [42-103] 92 (12/08 1109) Resp:  [15-36] 18 (12/08 0454) BP: (85-117)/(56-75) 117/63 (12/08 1109) SpO2:  [97 %-100 %] 97 % (12/08 0848) Weight:  [121.1 kg (266 lb 14.4 oz)-124.7 kg (275 lb)] 121.1 kg (266 lb 14.4 oz) (12/07 2348)     Height: 6' (182.9 cm) Weight: 121.1 kg (266 lb 14.4 oz) BMI (Calculated): 36.19    Physical Exam:  General: alert, active, oriented x 3, no distress Respiratory: breathing non-labored at rest  Cardiovascular: regular rate and irregular rythm Gastrointestinal: soft, moderate-tender on left lower quadrant and suprapubic area, and non-distended  Labs:  CBC Latest Ref Rng & Units 05/14/2017 05/13/2017 05/09/2017  WBC 3.8 - 10.6 K/uL 7.1 9.6 4.3  Hemoglobin 13.0 - 18.0 g/dL 57.5 05.1 83.3  Hematocrit 40.0 - 52.0 % 45.6 48.4 41.7  Platelets 150 - 440 K/uL 158 183 183   CMP Latest Ref Rng & Units 05/14/2017 05/13/2017 05/09/2017  Glucose 65 - 99 mg/dL 582(P) 189(Q) 421(I)  BUN 6 - 20 mg/dL 31(Y) 81(V) 10  Creatinine 0.61 - 1.24 mg/dL 8.86(L) 7.37(V) 6.68  Sodium 135 - 145 mmol/L 128(L) 126(L) 139  Potassium 3.5 - 5.1 mmol/L 3.2(L) 3.7 4.6  Chloride 101 - 111 mmol/L 88(L) 84(L) 101  CO2 22 - 32 mmol/L 26 27 22   Calcium 8.9 - 10.3 mg/dL 1.5(T) 9.0 9.1  Total Protein 6.5 - 8.1 g/dL 7.4 4.7(M) 7.7  Total Bilirubin 0.3 - 1.2 mg/dL 2.4(H) 2.4(H) 1.2  Alkaline Phos 38 - 126 U/L 96 115 122(H)  AST 15 - 41 U/L 43(H) 58(H) 51(H)  ALT 17 - 63 U/L 45 53 61(H)    Assessment/Plan:  66 y.o. male with acute  diverticulitis wit multiple comorbidity, including CHF, atrial fibrillation on anticoagulation, smoker, obesity that was admitted for conservative management of acute diverticulitis. No abscess at this moment. Patient starting to respond to IV abx therapy as the pain has improved some but still with moderate tenderness. Will keep NPO for today until pain improved more. Normal WBC, voiding spontaneously.

## 2017-05-14 NOTE — Consult Note (Signed)
[facility] [place] [facility]   PATIENT NAME: Eugene Henderson    MR#:  [id number]  DATE OF BIRTH:  08-31-1950  DATE OF ADMISSION:  05/13/2017  PRIMARY CARE PHYSICIAN: Dorcas Carrow, DO   CONSULT REQUESTING/REFERRING PHYSICIAN: Dr. Aleen Campi  REASON FOR CONSULT: CHF, Afib  CHIEF COMPLAINT:   Chief Complaint  Patient presents with  . Chest Pain    HISTORY OF PRESENT ILLNESS:  Eugene Henderson  is a 66 y.o. male with a known history of Chronic systolic chf, atrial fibrillation on Eliquis presented to the hospital with abdominal pain and chest pain.  Patient was found to have diverticulitis with perforation and admitted to surgical service.  Today patient's abdominal pain is improved.  No nausea or vomiting.  No shortness of breath or chest pain. On IV fluids.  Lasix held.   PAST MEDICAL HISTORY:   Past Medical History:  Diagnosis Date  . Arthritis   . Asthma   . CHF (congestive heart failure) (HCC)    a. EF 25-30% by echo in 07/2016 with cath showing no significant CAD b. 01/2017: EF 30-35% with diffuse HK and moderate MR  . Hypertension   . Hyperthyroidism   . Noncompliance   . Persistent atrial fibrillation (HCC)     PAST SURGICAL HISTOIRY:   Past Surgical History:  Procedure Laterality Date  . JOINT REPLACEMENT Right   . RIGHT/LEFT HEART CATH AND CORONARY ANGIOGRAPHY N/A 08/02/2016   Procedure: Right/Left Heart Cath and Coronary Angiography;  Surgeon: Iran Ouch, MD;  Location: [facility] INVASIVE CV LAB;  Service: Cardiovascular;  Laterality: N/A;  . TESTICLE SURGERY     Patient states that he had to have the tube fixed.    SOCIAL HISTORY:   Social History   Tobacco Use  . Smoking status: Current Some Day Smoker    Packs/day: 0.25    Years: 25.00    Pack years: 6.25    Types: Cigarettes  . Smokeless tobacco: Never Used  Substance Use Topics  . Alcohol use: Yes    Comment: Socially - once a month    FAMILY HISTORY:   Family  History  Problem Relation Age of Onset  . Diabetes Mother   . Diabetes Father   . Heart disease Brother   . Heart attack Brother   . Diabetes Brother   . Kidney failure Brother     DRUG ALLERGIES:  No Known Allergies  REVIEW OF SYSTEMS:   ROS  CONSTITUTIONAL: No fever, fatigue or weakness.  EYES: No blurred or double vision.  EARS, NOSE, AND THROAT: No tinnitus or ear pain.  RESPIRATORY: No cough, shortness of breath, wheezing or hemoptysis.  CARDIOVASCULAR: No chest pain, orthopnea, edema.  GASTROINTESTINAL: No nausea, vomiting, diarrhea.  Positive abdominal pain.  GENITOURINARY: No dysuria, hematuria.  ENDOCRINE: No polyuria, nocturia,  HEMATOLOGY: No anemia, easy bruising or bleeding SKIN: No rash or lesion. MUSCULOSKELETAL: No joint pain or arthritis.   NEUROLOGIC: No tingling, numbness, weakness.  PSYCHIATRY: No anxiety or depression.   MEDICATIONS AT HOME:   Prior to Admission medications   Medication Sig Start Date End Date Taking? Authorizing Provider  apixaban (ELIQUIS) 5 MG TABS tablet Take 1 tablet (5 mg total) by mouth 2 (two) times daily. 05/09/17  Yes Shaker, Megan P, DO  budesonide-formoterol (SYMBICORT) 160-4.5 MCG/ACT inhaler Inhale 2 puffs into the lungs 2 (two) times daily. 05/09/17  Yes Efaw, Megan P, DO  cyclobenzaprine (FLEXERIL) 10 MG tablet TAKE 1 TABLET BY  MOUTH EVERYDAY AT BEDTIME 03/28/17  Yes Bushong, Megan P, DO  digoxin (LANOXIN) 0.125 MG tablet Take 1 tablet (0.125 mg total) by mouth daily. 05/09/17  Yes Roper, Megan P, DO  furosemide (LASIX) 40 MG tablet Take 1 tablet (40 mg total) by mouth daily as needed. For SOB/LE edema 05/09/17  Yes Melcher, Megan P, DO  gentamicin (GARAMYCIN) 0.3 % ophthalmic solution Place 1 drop into both eyes every 4 (four) hours. 05/06/17  Yes Joni Reining, PA-C  methimazole (TAPAZOLE) 10 MG tablet Take 2 tablets (20 mg total) by mouth 2 (two) times daily. 05/09/17  Yes Perezperez, Megan P, DO  metoprolol  (TOPROL-XL) 200 MG 24 hr tablet Take 1 tablet (200 mg total) by mouth daily. Take with or immediately following a meal. 05/09/17  Yes Coppess, Megan P, DO  naproxen sodium (ANAPROX) 220 MG tablet Take 220 mg by mouth 2 (two) times daily with a meal.   Yes [provider]  potassium chloride SA (K-DUR,KLOR-CON) 20 MEQ tablet Take 1 tablet (20 mEq total) by mouth daily. 05/09/17  Yes Alcon, Megan P, DO  sacubitril-valsartan (ENTRESTO) 24-26 MG Take 1 tablet by mouth 2 (two) times daily.   Yes [provider]  spironolactone (ALDACTONE) 25 MG tablet Take 0.5 tablets (12.5 mg total) by mouth daily. 05/09/17  Yes Kasal, Megan P, DO  acetaminophen (TYLENOL) 500 MG tablet Take 500 mg by mouth every 6 (six) hours as needed.    [provider]  albuterol (PROVENTIL HFA;VENTOLIN HFA) 108 (90 Base) MCG/ACT inhaler Inhale 2 puffs into the lungs every 6 (six) hours as needed for wheezing or shortness of breath. 02/03/17   Necaise, Megan P, DO  feeding supplement (BOOST / RESOURCE BREEZE) LIQD Take 1 Container by mouth 2 (two) times daily between meals. 11/26/16   Delfino Lovett, MD  naphazoline-pheniramine (NAPHCON-A) 0.025-0.3 % ophthalmic solution Place 1 drop into both eyes 4 (four) times daily as needed for eye irritation. Patient not taking: Reported on 05/13/2017 05/06/17   Joni Reining, PA-C  sacubitril-valsartan (ENTRESTO) 49-51 MG Take 1 tablet by mouth 2 (two) times daily. Patient not taking: Reported on 05/13/2017 02/21/17   Delma Freeze, FNP      VITAL SIGNS:  Blood pressure 117/63, pulse 92, temperature 97.7 F (36.5 C), temperature source Oral, resp. rate 18, height 6' (1.829 m), weight 121.1 kg (266 lb 14.4 oz), SpO2 97 %.  PHYSICAL EXAMINATION:  GENERAL:  66 y.o.-year-old patient lying in the bed with no acute distress.  EYES: Pupils equal, round, reactive to light and accommodation. No scleral icterus. Extraocular muscles intact.  HEENT: Head atraumatic,  normocephalic. Oropharynx and nasopharynx clear.  NECK:  Supple, no jugular venous distention. No thyroid enlargement, no tenderness.  LUNGS: Normal breath sounds bilaterally, no wheezing, rales,rhonchi or crepitation. No use of accessory muscles of respiration.  CARDIOVASCULAR: S1, S2 normal. No murmurs, rubs, or gallops.  ABDOMEN: Soft, lower abdominal tenderness, nondistended. Bowel sounds present. No organomegaly or mass.  EXTREMITIES: No pedal edema, cyanosis, or clubbing.  NEUROLOGIC: Cranial nerves II through XII are intact. Muscle strength 5/5 in all extremities. Sensation intact. Gait not checked.  PSYCHIATRIC: The patient is alert and oriented x 3.  SKIN: No obvious rash, lesion, or ulcer.   LABORATORY PANEL:   CBC Recent Labs  Lab 05/14/17 0351  WBC 7.1  HGB 15.6  HCT 45.6  PLT 158   ------------------------------------------------------------------------------------------------------------------  Chemistries  Recent Labs  Lab 05/14/17 0351  NA 128*  K 3.2*  CL 88*  CO2 26  GLUCOSE 102*  BUN 35*  CREATININE 1.51*  CALCIUM 8.4*  MG 1.7  AST 43*  ALT 45  ALKPHOS 96  BILITOT 2.4*   ------------------------------------------------------------------------------------------------------------------  Cardiac Enzymes Recent Labs  Lab 05/14/17 0351  TROPONINI 0.03*   ------------------------------------------------------------------------------------------------------------------  RADIOLOGY:  Ct Abdomen Pelvis Wo Contrast  Result Date: 05/13/2017 CLINICAL DATA:  Chest pain.  Suspected diverticulitis. EXAM: CT CHEST, ABDOMEN AND PELVIS WITHOUT CONTRAST TECHNIQUE: Multidetector CT imaging of the chest, abdomen and pelvis was performed following the standard protocol without IV contrast. COMPARISON:  CT of the abdomen and pelvis November 22, 2016 FINDINGS: CT CHEST FINDINGS Cardiovascular: The ascending thoracic aorta measures 5.3 cm on series 2, image 31. The  remainder of the thoracic aorta is normal in caliber. Scattered atherosclerotic changes seen. The central pulmonary artery is normal in caliber. Coronary artery calcifications are seen in both the right and left coronary arteries. No gross cardiomegaly. Mediastinum/Nodes: No enlarged mediastinal, hilar, or axillary lymph nodes. Thyroid gland, trachea, and esophagus demonstrate no significant findings. Lungs/Pleura: Lungs are clear. No pleural effusion or pneumothorax. Musculoskeletal: See below. CT ABDOMEN PELVIS FINDINGS Hepatobiliary: No focal liver abnormality is seen. No gallstones, gallbladder wall thickening, or biliary dilatation. Pancreas: Unremarkable. No pancreatic ductal dilatation or surrounding inflammatory changes. Spleen: Normal in size without focal abnormality. Adrenals/Urinary Tract: Adrenal glands are unremarkable. Kidneys are normal, without renal calculi, focal lesion, or hydronephrosis. Bladder is unremarkable. Stomach/Bowel: The stomach and small bowel are normal. The proximal sigmoid colon is thickened with adjacent stranding and adjacent extraluminal gas. The collection and gas extends just superior to the colon measuring up to 2.2 cm on series 2, image 100. No associated fluid collection. There are diverticuli in this segment of thickened inflamed colon. This is the same site as diverticulitis seen in June of 2018. Other scattered diverticuli are seen. The colon is otherwise normal. The appendix is normal. Vascular/Lymphatic: The proximal abdominal aorta measures 4.4 x 4.0 cm today, unchanged. The remainder of the abdominal aorta is nonaneurysmal. 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 Atherosclerotic changes seen in the abdominal aorta, iliac vessels common femoral vessels. No adenopathy. Reproductive: Prostate is unremarkable. Other: Previous ventral wall hernia repair with mesh identified. Musculoskeletal: Severe degenerative changes in the left hip. The  spine is stable. IMPRESSION: 1. 1. There is a segment of sigmoid colon which demonstrates wall thickening, diverticula, and adjacent inflammation. There is also a pocket of adjacent extraluminal gas. The constellation of findings is consistent with perforated diverticulitis. The patient also had diverticulitis at this site in June of 2018. Recommend follow-up to complete resolution. 2. The ascending thoracic aorta measures 5.3 cm proximally. Ascending thoracic aortic aneurysm. Recommend semi-annual imaging followup by CTA or MRA and referral to cardiothoracic surgery if not already obtained. This recommendation follows 2010 ACCF/AHA/AATS/ACR/ASA/SCA/SCAI/SIR/STS/SVM Guidelines for the Diagnosis and Management of Patients With Thoracic Aortic Disease. Circulation. 2010; 121: Z610-R604 3. The proximal abdominal aorta measures 4.4 x 4 cm, unchanged. Recommend followup by ultrasound in 1 year. This recommendation follows ACR consensus guidelines: White Paper of the ACR Incidental Findings Committee II on Vascular Findings. J Am Coll Radiol 2013; 10:789-794. 4. Atherosclerotic changes in the aorta and branching vessels. 5. Coronary artery calcifications. Findings called to Dr. Alphonzo Lemmings. Electronically Signed   By: Gerome Sam III M.D   On: 05/13/2017 21:11   Dg Chest 2 View  Result Date: 05/13/2017 CLINICAL DATA:  66 year old male with right-sided chest  pain onset today. EXAM: CHEST  2 VIEW COMPARISON:  05/09/2017 FINDINGS: Stable cardiomegaly with slight uncoiling of the thoracic aorta. No pneumonic consolidation or CHF. No effusion or pneumothorax. Left glenohumeral joint osteoarthritis with spurring off the humeral head. No acute osseous abnormality. IMPRESSION: No active cardiopulmonary disease.  Stable cardiomegaly. Electronically Signed   By: Tollie Eth M.D.   On: 05/13/2017 17:18   Ct Chest Wo Contrast  Result Date: 05/13/2017 CLINICAL DATA:  Chest pain.  Suspected diverticulitis. EXAM: CT CHEST,  ABDOMEN AND PELVIS WITHOUT CONTRAST TECHNIQUE: Multidetector CT imaging of the chest, abdomen and pelvis was performed following the standard protocol without IV contrast. COMPARISON:  CT of the abdomen and pelvis November 22, 2016 FINDINGS: CT CHEST FINDINGS Cardiovascular: The ascending thoracic aorta measures 5.3 cm on series 2, image 31. The remainder of the thoracic aorta is normal in caliber. Scattered atherosclerotic changes seen. The central pulmonary artery is normal in caliber. Coronary artery calcifications are seen in both the right and left coronary arteries. No gross cardiomegaly. Mediastinum/Nodes: No enlarged mediastinal, hilar, or axillary lymph nodes. Thyroid gland, trachea, and esophagus demonstrate no significant findings. Lungs/Pleura: Lungs are clear. No pleural effusion or pneumothorax. Musculoskeletal: See below. CT ABDOMEN PELVIS FINDINGS Hepatobiliary: No focal liver abnormality is seen. No gallstones, gallbladder wall thickening, or biliary dilatation. Pancreas: Unremarkable. No pancreatic ductal dilatation or surrounding inflammatory changes. Spleen: Normal in size without focal abnormality. Adrenals/Urinary Tract: Adrenal glands are unremarkable. Kidneys are normal, without renal calculi, focal lesion, or hydronephrosis. Bladder is unremarkable. Stomach/Bowel: The stomach and small bowel are normal. The proximal sigmoid colon is thickened with adjacent stranding and adjacent extraluminal gas. The collection and gas extends just superior to the colon measuring up to 2.2 cm on series 2, image 100. No associated fluid collection. There are diverticuli in this segment of thickened inflamed colon. This is the same site as diverticulitis seen in June of 2018. Other scattered diverticuli are seen. The colon is otherwise normal. The appendix is normal. Vascular/Lymphatic: The proximal abdominal aorta measures 4.4 x 4.0 cm today, unchanged. The remainder of the abdominal aorta is nonaneurysmal. 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 Atherosclerotic changes seen in the abdominal aorta, iliac vessels common femoral vessels. No adenopathy. Reproductive: Prostate is unremarkable. Other: Previous ventral wall hernia repair with mesh identified. Musculoskeletal: Severe degenerative changes in the left hip. The spine is stable. IMPRESSION: 1. 1. There is a segment of sigmoid colon which demonstrates wall thickening, diverticula, and adjacent inflammation. There is also a pocket of adjacent extraluminal gas. The constellation of findings is consistent with perforated diverticulitis. The patient also had diverticulitis at this site in June of 2018. Recommend follow-up to complete resolution. 2. The ascending thoracic aorta measures 5.3 cm proximally. Ascending thoracic aortic aneurysm. Recommend semi-annual imaging followup by CTA or MRA and referral to cardiothoracic surgery if not already obtained. This recommendation follows 2010 ACCF/AHA/AATS/ACR/ASA/SCA/SCAI/SIR/STS/SVM Guidelines for the Diagnosis and Management of Patients With Thoracic Aortic Disease. Circulation. 2010; 121: B449-Q759 3. The proximal abdominal aorta measures 4.4 x 4 cm, unchanged. Recommend followup by ultrasound in 1 year. This recommendation follows ACR consensus guidelines: White Paper of the ACR Incidental Findings Committee II on Vascular Findings. J Am Coll Radiol 2013; 10:789-794. 4. Atherosclerotic changes in the aorta and branching vessels. 5. Coronary artery calcifications. Findings called to Dr. Alphonzo Lemmings. Electronically Signed   By: Gerome Sam III M.D   On: 05/13/2017 21:11   US Abdomen Limited Ruq  Result Date: 05/13/2017 CLINICAL DATA:  Pain. EXAM: ULTRASOUND ABDOMEN LIMITED RIGHT UPPER QUADRANT COMPARISON:  None. FINDINGS: Gallbladder: No gallstones or wall thickening visualized. No sonographic Murphy sign noted by sonographer. Common bile duct: Diameter: 4.9 mm Liver: No focal lesion  identified. Within normal limits in parenchymal echogenicity. Portal vein is patent on color Doppler imaging with normal direction of blood flow towards the liver. IMPRESSION: Normal study pair Electronically Signed   By: Gerome Samavid  Williams III M.D   On: 05/13/2017 20:32    EKG:   Orders placed or performed during the hospital encounter of 05/13/17  . EKG 12-Lead  . EKG 12-Lead  . ED EKG within 10 minutes  . ED EKG within 10 minutes    IMPRESSION AND PLAN:   * Acute diverticulitis with perforation On IV abx Conservative management per surgery  * Chronic systolic chf Lasix on hold. Continue IVF for now Monitor for fluid overload  * AKI On IVF Monitor I/os  * pAfib ON metoprolol and digoxin Eliquis held  All the records are reviewed and case discussed with Consulting provider. Management plans discussed with the patient, family and they are in agreement.  CODE STATUS: FULL CODE  TOTAL TIME TAKING CARE OF THIS PATIENT: 40 minutes.    Molinda BailiffSrikar R Delano Scardino M.D on 05/14/2017 at 11:26 AM  Between 7am to 6pm - Pager - [phone]  After 6pm go to [email or web address] - password [email or web address] [facility]  [facility] [place] [facility]  Office  [phone]  CC: Primary care Physician: Dorcas CarrowJohnson, Megan P, DO   Note: This dictation was prepared with Dragon dictation along with smaller phrase technology. Any transcriptional errors that result from this process are unintentional.

## 2017-05-14 NOTE — Progress Notes (Signed)
Dr Aleen Campi notified troponin 0.03, No new orders given.

## 2017-05-15 LAB — BASIC METABOLIC PANEL
ANION GAP: 12 (ref 5–15)
BUN: 23 mg/dL — ABNORMAL HIGH (ref 6–20)
CHLORIDE: 94 mmol/L — AB (ref 101–111)
CO2: 25 mmol/L (ref 22–32)
Calcium: 8.4 mg/dL — ABNORMAL LOW (ref 8.9–10.3)
Creatinine, Ser: 0.99 mg/dL (ref 0.61–1.24)
GFR calc Af Amer: >60 mL/min (ref >60)
GLUCOSE: 101 mg/dL — AB (ref 65–99)
POTASSIUM: 3.8 mmol/L (ref 3.5–5.1)
SODIUM: 131 mmol/L — AB (ref 135–145)

## 2017-05-15 LAB — EYE CULTURE: CULTURE: NO GROWTH

## 2017-05-15 MED ORDER — METOPROLOL TARTRATE 50 MG PO TABS
100.0000 mg | ORAL_TABLET | Freq: Two times a day (BID) | ORAL | Status: DC
  Administered 2017-05-15 – 2017-05-16 (×2): 100 mg via ORAL
  Filled 2017-05-15 (×3): qty 2

## 2017-05-15 MED ORDER — MOXIFLOXACIN HCL 0.5 % OP SOLN
1.0000 [drp] | OPHTHALMIC | Status: DC
  Administered 2017-05-15 – 2017-05-16 (×4): 1 [drp] via OPHTHALMIC
  Filled 2017-05-15: qty 3

## 2017-05-15 MED ORDER — METOPROLOL TARTRATE 50 MG PO TABS
50.0000 mg | ORAL_TABLET | Freq: Once | ORAL | Status: AC
  Administered 2017-05-15: 50 mg via ORAL

## 2017-05-15 MED ORDER — FUROSEMIDE 20 MG PO TABS
20.0000 mg | ORAL_TABLET | Freq: Every day | ORAL | Status: DC
  Administered 2017-05-15 – 2017-05-16 (×2): 20 mg via ORAL
  Filled 2017-05-15 (×2): qty 1

## 2017-05-15 NOTE — Progress Notes (Signed)
[employer] [facility] - [place] at [facility]   PATIENT NAME: Eugene Henderson    MR#:  [id number]  DATE OF BIRTH:  Sep 18, 1950  SUBJECTIVE:  CHIEF COMPLAINT:   Chief Complaint  Patient presents with  . Chest Pain   NO abd pain Started on clears  Afib with RVR into 130w  REVIEW OF SYSTEMS:    Review of Systems  Constitutional: Positive for malaise/fatigue. Negative for chills and fever.  HENT: Negative for sore throat.   Eyes: Negative for blurred vision, double vision and pain.  Respiratory: Negative for cough, hemoptysis, shortness of breath and wheezing.   Cardiovascular: Negative for chest pain, palpitations, orthopnea and leg swelling.  Gastrointestinal: Negative for abdominal pain, constipation, diarrhea, heartburn, nausea and vomiting.  Genitourinary: Negative for dysuria and hematuria.  Musculoskeletal: Negative for back pain and joint pain.  Skin: Negative for rash.  Neurological: Positive for weakness. Negative for sensory change, speech change, focal weakness and headaches.  Endo/Heme/Allergies: Does not bruise/bleed easily.  Psychiatric/Behavioral: Negative for depression. The patient is not nervous/anxious.     DRUG ALLERGIES:  No Known Allergies  VITALS:  Blood pressure 137/90, pulse (!) 120, temperature 98.4 F (36.9 C), temperature source Oral, resp. rate 18, height 6' (1.829 m), weight 121.1 kg (266 lb 14.4 oz), SpO2 99 %.  PHYSICAL EXAMINATION:   Physical Exam  GENERAL:  66 y.o.-year-old patient lying in the bed with no acute distress.  EYES: Pupils equal, round, reactive to light and accommodation. No scleral icterus. Extraocular muscles intact.  HEENT: Head atraumatic, normocephalic. Oropharynx and nasopharynx clear.  NECK:  Supple, no jugular venous distention. No thyroid enlargement, no tenderness.  LUNGS: Normal breath sounds bilaterally, no wheezing, rales, rhonchi. No use of accessory muscles of respiration.  CARDIOVASCULAR: S1, S2  normal. No murmurs, rubs, or gallops.  ABDOMEN: Soft, nontender, nondistended. Bowel sounds present. No organomegaly or mass.  EXTREMITIES: No cyanosis, clubbing or edema b/l.    NEUROLOGIC: Cranial nerves II through XII are intact. No focal Motor or sensory deficits b/l.   PSYCHIATRIC: The patient is alert and oriented x 3.  SKIN: No obvious rash, lesion, or ulcer.   LABORATORY PANEL:   CBC Recent Labs  Lab 05/14/17 0351  WBC 7.1  HGB 15.6  HCT 45.6  PLT 158   ------------------------------------------------------------------------------------------------------------------ Chemistries  Recent Labs  Lab 05/14/17 0351 05/15/17 0325  NA 128* 131*  K 3.2* 3.8  CL 88* 94*  CO2 26 25  GLUCOSE 102* 101*  BUN 35* 23*  CREATININE 1.51* 0.99  CALCIUM 8.4* 8.4*  MG 1.7  --   AST 43*  --   ALT 45  --   ALKPHOS 96  --   BILITOT 2.4*  --    ------------------------------------------------------------------------------------------------------------------  Cardiac Enzymes Recent Labs  Lab 05/14/17 0351  TROPONINI 0.03*   ------------------------------------------------------------------------------------------------------------------  RADIOLOGY:  Ct Abdomen Pelvis Wo Contrast  Result Date: 05/13/2017 CLINICAL DATA:  Chest pain.  Suspected diverticulitis. EXAM: CT CHEST, ABDOMEN AND PELVIS WITHOUT CONTRAST TECHNIQUE: Multidetector CT imaging of the chest, abdomen and pelvis was performed following the standard protocol without IV contrast. COMPARISON:  CT of the abdomen and pelvis November 22, 2016 FINDINGS: CT CHEST FINDINGS Cardiovascular: The ascending thoracic aorta measures 5.3 cm on series 2, image 31. The remainder of the thoracic aorta is normal in caliber. Scattered atherosclerotic changes seen. The central pulmonary artery is normal in caliber. Coronary artery calcifications are seen in both the right and left coronary arteries. No gross cardiomegaly.  Mediastinum/Nodes: No  enlarged mediastinal, hilar, or axillary lymph nodes. Thyroid gland, trachea, and esophagus demonstrate no significant findings. Lungs/Pleura: Lungs are clear. No pleural effusion or pneumothorax. Musculoskeletal: See below. CT ABDOMEN PELVIS FINDINGS Hepatobiliary: No focal liver abnormality is seen. No gallstones, gallbladder wall thickening, or biliary dilatation. Pancreas: Unremarkable. No pancreatic ductal dilatation or surrounding inflammatory changes. Spleen: Normal in size without focal abnormality. Adrenals/Urinary Tract: Adrenal glands are unremarkable. Kidneys are normal, without renal calculi, focal lesion, or hydronephrosis. Bladder is unremarkable. Stomach/Bowel: The stomach and small bowel are normal. The proximal sigmoid colon is thickened with adjacent stranding and adjacent extraluminal gas. The collection and gas extends just superior to the colon measuring up to 2.2 cm on series 2, image 100. No associated fluid collection. There are diverticuli in this segment of thickened inflamed colon. This is the same site as diverticulitis seen in June of 2018. Other scattered diverticuli are seen. The colon is otherwise normal. The appendix is normal. Vascular/Lymphatic: The proximal abdominal aorta measures 4.4 x 4.0 cm today, unchanged. The remainder of the abdominal aorta is nonaneurysmal. 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 Atherosclerotic changes seen in the abdominal aorta, iliac vessels common femoral vessels. No adenopathy. Reproductive: Prostate is unremarkable. Other: Previous ventral wall hernia repair with mesh identified. Musculoskeletal: Severe degenerative changes in the left hip. The spine is stable. IMPRESSION: 1. 1. There is a segment of sigmoid colon which demonstrates wall thickening, diverticula, and adjacent inflammation. There is also a pocket of adjacent extraluminal gas. The constellation of findings is consistent with perforated diverticulitis.  The patient also had diverticulitis at this site in June of 2018. Recommend follow-up to complete resolution. 2. The ascending thoracic aorta measures 5.3 cm proximally. Ascending thoracic aortic aneurysm. Recommend semi-annual imaging followup by CTA or MRA and referral to cardiothoracic surgery if not already obtained. This recommendation follows 2010 ACCF/AHA/AATS/ACR/ASA/SCA/SCAI/SIR/STS/SVM Guidelines for the Diagnosis and Management of Patients With Thoracic Aortic Disease. Circulation. 2010; 121: [id number] 3. The proximal abdominal aorta measures 4.4 x 4 cm, unchanged. Recommend followup by ultrasound in 1 year. This recommendation follows ACR consensus guidelines: White Paper of the ACR Incidental Findings Committee II on Vascular Findings. J Am Coll Radiol 2013; 10:789-794. 4. Atherosclerotic changes in the aorta and branching vessels. 5. Coronary artery calcifications. Findings called to Dr. Alphonzo LemmingsMcShane. Electronically Signed   By: Gerome Samavid  Williams III M.D   On: 05/13/2017 21:11   Dg Chest 2 View  Result Date: 05/13/2017 CLINICAL DATA:  66 year old male with right-sided chest pain onset today. EXAM: CHEST  2 VIEW COMPARISON:  05/09/2017 FINDINGS: Stable cardiomegaly with slight uncoiling of the thoracic aorta. No pneumonic consolidation or CHF. No effusion or pneumothorax. Left glenohumeral joint osteoarthritis with spurring off the humeral head. No acute osseous abnormality. IMPRESSION: No active cardiopulmonary disease.  Stable cardiomegaly. Electronically Signed   By: Tollie Ethavid  Kwon M.D.   On: 05/13/2017 17:18   Ct Chest Wo Contrast  Result Date: 05/13/2017 CLINICAL DATA:  Chest pain.  Suspected diverticulitis. EXAM: CT CHEST, ABDOMEN AND PELVIS WITHOUT CONTRAST TECHNIQUE: Multidetector CT imaging of the chest, abdomen and pelvis was performed following the standard protocol without IV contrast. COMPARISON:  CT of the abdomen and pelvis November 22, 2016 FINDINGS: CT CHEST FINDINGS Cardiovascular: The  ascending thoracic aorta measures 5.3 cm on series 2, image 31. The remainder of the thoracic aorta is normal in caliber. Scattered atherosclerotic changes seen. The central pulmonary artery is normal in caliber. Coronary artery calcifications are  seen in both the right and left coronary arteries. No gross cardiomegaly. Mediastinum/Nodes: No enlarged mediastinal, hilar, or axillary lymph nodes. Thyroid gland, trachea, and esophagus demonstrate no significant findings. Lungs/Pleura: Lungs are clear. No pleural effusion or pneumothorax. Musculoskeletal: See below. CT ABDOMEN PELVIS FINDINGS Hepatobiliary: No focal liver abnormality is seen. No gallstones, gallbladder wall thickening, or biliary dilatation. Pancreas: Unremarkable. No pancreatic ductal dilatation or surrounding inflammatory changes. Spleen: Normal in size without focal abnormality. Adrenals/Urinary Tract: Adrenal glands are unremarkable. Kidneys are normal, without renal calculi, focal lesion, or hydronephrosis. Bladder is unremarkable. Stomach/Bowel: The stomach and small bowel are normal. The proximal sigmoid colon is thickened with adjacent stranding and adjacent extraluminal gas. The collection and gas extends just superior to the colon measuring up to 2.2 cm on series 2, image 100. No associated fluid collection. There are diverticuli in this segment of thickened inflamed colon. This is the same site as diverticulitis seen in June of 2018. Other scattered diverticuli are seen. The colon is otherwise normal. The appendix is normal. Vascular/Lymphatic: The proximal abdominal aorta measures 4.4 x 4.0 cm today, unchanged. The remainder of the abdominal aorta is nonaneurysmal. 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 Atherosclerotic changes seen in the abdominal aorta, iliac vessels common femoral vessels. No adenopathy. Reproductive: Prostate is unremarkable. Other: Previous ventral wall hernia repair with mesh identified.  Musculoskeletal: Severe degenerative changes in the left hip. The spine is stable. IMPRESSION: 1. 1. There is a segment of sigmoid colon which demonstrates wall thickening, diverticula, and adjacent inflammation. There is also a pocket of adjacent extraluminal gas. The constellation of findings is consistent with perforated diverticulitis. The patient also had diverticulitis at this site in June of 2018. Recommend follow-up to complete resolution. 2. The ascending thoracic aorta measures 5.3 cm proximally. Ascending thoracic aortic aneurysm. Recommend semi-annual imaging followup by CTA or MRA and referral to cardiothoracic surgery if not already obtained. This recommendation follows 2010 ACCF/AHA/AATS/ACR/ASA/SCA/SCAI/SIR/STS/SVM Guidelines for the Diagnosis and Management of Patients With Thoracic Aortic Disease. Circulation. 2010; 121: [id number] 3. The proximal abdominal aorta measures 4.4 x 4 cm, unchanged. Recommend followup by ultrasound in 1 year. This recommendation follows ACR consensus guidelines: White Paper of the ACR Incidental Findings Committee II on Vascular Findings. J Am Coll Radiol 2013; 10:789-794. 4. Atherosclerotic changes in the aorta and branching vessels. 5. Coronary artery calcifications. Findings called to Dr. Alphonzo Lemmings. Electronically Signed   By: Gerome Sam III M.D   On: 05/13/2017 21:11   US Abdomen Limited Ruq  Result Date: 05/13/2017 CLINICAL DATA:  Pain. EXAM: ULTRASOUND ABDOMEN LIMITED RIGHT UPPER QUADRANT COMPARISON:  None. FINDINGS: Gallbladder: No gallstones or wall thickening visualized. No sonographic Murphy sign noted by sonographer. Common bile duct: Diameter: 4.9 mm Liver: No focal lesion identified. Within normal limits in parenchymal echogenicity. Portal vein is patent on color Doppler imaging with normal direction of blood flow towards the liver. IMPRESSION: Normal study pair Electronically Signed   By: Gerome Sam III M.D   On: 05/13/2017 20:32      ASSESSMENT AND PLAN:   * pAfib On metoprolol and digoxin Eliquis held Metoprolol dose increase due to RVR today.  * Acute diverticulitis with perforation On IV abx Conservative management per surgery  * Chronic systolic chf Stop IVF. Restart lasix today  * AKI On IVF Resolved  * Glaucoma He has seen an ophthalmologist 3 days back. Started on eye drops. Continue.    All the records are reviewed and case discussed  with Care Management/Social Worker Management plans discussed with the patient, family and they are in agreement.  CODE STATUS: FULL CODE  DVT Prophylaxis: SCDs  TOTAL TIME TAKING CARE OF THIS PATIENT: 30 minutes.   POSSIBLE D/C IN 1-2 DAYS, DEPENDING ON CLINICAL CONDITION.  Molinda Bailiff Aslan Himes M.D on 05/15/2017 at 9:42 AM  Between 7am to 6pm - Pager - [phone]  After 6pm go to [email or web address] - password [email or web address] [facility]  [facility] [place] [facility]  Office  [phone]  CC: Primary care physician; Dorcas Carrow, DO  Note: This dictation was prepared with Dragon dictation along with smaller phrase technology. Any transcriptional errors that result from this process are unintentional.

## 2017-05-15 NOTE — Progress Notes (Signed)
Patient ID: Eugene Henderson, male   DOB: March 06, 1951, 66 y.o.   MRN: [id number]      SURGICAL PROGRESS NOTE   Hospital Day(s): 2.   Post op day(s):  Marland Kitchen   Interval History: Patient seen and examined, no acute events or new complaints overnight. Patient reports feeling better with decreased abdominal pain. denies nausea or vomiting. Passing gas. No diarrhea.   Vital signs in last 24 hours: [min-max] current  Temp:  [97.7 F (36.5 C)-98.4 F (36.9 C)] 98.4 F (36.9 C) (12/09 0456) Pulse Rate:  [46-103] 103 (12/09 0500) Resp:  [18-20] 18 (12/09 0456) BP: (98-118)/(63-73) 113/69 (12/09 0456) SpO2:  [97 %-100 %] 99 % (12/09 0456)     Height: 6' (182.9 cm) Weight: 121.1 kg (266 lb 14.4 oz) BMI (Calculated): 36.19    Physical Exam:  Constitutional: alert, cooperative and no distress  Respiratory: breathing non-labored at rest  Cardiovascular: irregular rate and rythm Gastrointestinal: soft, mild -tender on suprapubic area, and non-distended  Labs:  CBC Latest Ref Rng & Units 05/14/2017 05/13/2017 05/09/2017  WBC 3.8 - 10.6 K/uL 7.1 9.6 4.3  Hemoglobin 13.0 - 18.0 g/dL 89.3 81.0 17.5  Hematocrit 40.0 - 52.0 % 45.6 48.4 41.7  Platelets 150 - 440 K/uL 158 183 183   CMP Latest Ref Rng & Units 05/15/2017 05/14/2017 05/13/2017  Glucose 65 - 99 mg/dL 102(H) 852(D) 782(U)  BUN 6 - 20 mg/dL 23(N) 36(R) 44(R)  Creatinine 0.61 - 1.24 mg/dL 1.54 0.08(Q) 7.61(P)  Sodium 135 - 145 mmol/L 131(L) 128(L) 126(L)  Potassium 3.5 - 5.1 mmol/L 3.8 3.2(L) 3.7  Chloride 101 - 111 mmol/L 94(L) 88(L) 84(L)  CO2 22 - 32 mmol/L 25 26 27   Calcium 8.9 - 10.3 mg/dL 5.0(D) 3.2(I) 9.0  Total Protein 6.5 - 8.1 g/dL - 7.4 7.1(I)  Total Bilirubin 0.3 - 1.2 mg/dL - 2.4(H) 2.4(H)  Alkaline Phos 38 - 126 U/L - 96 115  AST 15 - 41 U/L - 43(H) 58(H)  ALT 17 - 63 U/L - 45 53    Assessment/Plan:  66 y.o. male with acute diverticulitis wit multiple comorbidity, including CHF, atrial fibrillation on anticoagulation, smoker,  obesity that was admitted for conservative management of acute diverticulitis. Today patient feeling with decreased abdominal pain and passing gas. Will start clear liquid diet and assess for toleration. Patient with increased heart rate. Hospitalist contacted and managing with metoprolol. Normal WBC count.

## 2017-05-15 NOTE — Progress Notes (Signed)
Tried to get patient to ambulate and he refused stating " let's do it later after the football game goes off".

## 2017-05-16 DIAGNOSIS — K5792 Diverticulitis of intestine, part unspecified, without perforation or abscess without bleeding: Secondary | ICD-10-CM

## 2017-05-16 MED ORDER — METRONIDAZOLE 500 MG PO TABS: 500.0000 mg | ORAL_TABLET | Freq: Three times a day (TID) | ORAL | 1 refills | Status: DC

## 2017-05-16 MED ORDER — HYDROCODONE-ACETAMINOPHEN 5-325 MG PO TABS
1.0000 | ORAL_TABLET | ORAL | Status: DC | PRN
  Administered 2017-05-16 (×2): 1 via ORAL
  Filled 2017-05-16 (×2): qty 1

## 2017-05-16 MED ORDER — HYDROCODONE-ACETAMINOPHEN 5-300 MG PO TABS: 1.0000 | ORAL_TABLET | ORAL | 0 refills | Status: DC | PRN

## 2017-05-16 MED ORDER — TOBRAMYCIN 0.3 % OP OINT: TOPICAL_OINTMENT | OPHTHALMIC | 0 refills | Status: DC

## 2017-05-16 MED ORDER — SULFAMETHOXAZOLE-TRIMETHOPRIM 400-80 MG PO TABS: 1.0000 | ORAL_TABLET | Freq: Two times a day (BID) | ORAL | 1 refills | Status: DC

## 2017-05-16 NOTE — Progress Notes (Signed)
[employer] - [place] at [facility]   PATIENT NAME: Eugene Henderson    MR#:  [id number]  DATE OF BIRTH:  01/24/1951  SUBJECTIVE:  CHIEF COMPLAINT:   Chief Complaint  Patient presents with  . Chest Pain   - no abdominal pain now, feels better - hr well controlled  REVIEW OF SYSTEMS:  Review of Systems  Constitutional: Negative for chills and fever.  Respiratory: Negative for cough, shortness of breath and wheezing.   Cardiovascular: Negative for chest pain and palpitations.  Gastrointestinal: Positive for abdominal pain. Negative for constipation, diarrhea, nausea and vomiting.  Genitourinary: Negative for dysuria.  Neurological: Negative for dizziness, speech change, focal weakness, seizures and headaches.  Psychiatric/Behavioral: Negative for depression.    DRUG ALLERGIES:  No Known Allergies  VITALS:  Blood pressure (!) 131/91, pulse 88, temperature 98.2 F (36.8 C), temperature source Oral, resp. rate 18, height 6' (1.829 m), weight 121.1 kg (266 lb 14.4 oz), SpO2 100 %.  PHYSICAL EXAMINATION:  Physical Exam  GENERAL:  66 y.o.-year-old patient lying in the bed with no acute distress.  EYES: Pupils equal, round, reactive to light and accommodation. Right conjunctiva erythematous, No scleral icterus. Extraocular muscles intact.  HEENT: Head atraumatic, normocephalic. Oropharynx and nasopharynx clear.  NECK:  Supple, no jugular venous distention. No thyroid enlargement, no tenderness.  LUNGS: Normal breath sounds bilaterally, no wheezing, rales,rhonchi or crepitation. No use of accessory muscles of respiration.  CARDIOVASCULAR: S1, S2 normal. No murmurs, rubs, or gallops.  ABDOMEN: Soft, nontender, nondistended. Bowel sounds present. No organomegaly or mass.  EXTREMITIES: No pedal edema, cyanosis, or clubbing.  NEUROLOGIC: Cranial nerves II through XII are intact. Muscle strength 5/5 in all extremities. Sensation intact. Gait not checked.  PSYCHIATRIC:  The patient is alert and oriented x 3.  SKIN: No obvious rash, lesion, or ulcer.    LABORATORY PANEL:   CBC Recent Labs  Lab 05/14/17 0351  WBC 7.1  HGB 15.6  HCT 45.6  PLT 158   ------------------------------------------------------------------------------------------------------------------  Chemistries  Recent Labs  Lab 05/14/17 0351 05/15/17 0325  NA 128* 131*  K 3.2* 3.8  CL 88* 94*  CO2 26 25  GLUCOSE 102* 101*  BUN 35* 23*  CREATININE 1.51* 0.99  CALCIUM 8.4* 8.4*  MG 1.7  --   AST 43*  --   ALT 45  --   ALKPHOS 96  --   BILITOT 2.4*  --    ------------------------------------------------------------------------------------------------------------------  Cardiac Enzymes Recent Labs  Lab 05/14/17 0351  TROPONINI 0.03*   ------------------------------------------------------------------------------------------------------------------  RADIOLOGY:  No results found.  EKG:   Orders placed or performed during the hospital encounter of 05/13/17  . EKG 12-Lead  . EKG 12-Lead  . ED EKG within 10 minutes  . ED EKG within 10 minutes    ASSESSMENT AND PLAN:   66 year old male with chronic medical history significant for atrial fibrillation on Eliquis, chronic systolic CHF with EF of 30%, hypertension and arthritis presents to hospital secondary to abdominal pain and noted to have perforated diverticulitis.  Acute diverticulitis with perforation microabscess-conservative management. -Tolerating clear liquids well. -On IV Zosyn.  Management per surgery. -Advance diet and at this point discharge home today.  Will be discharged on oral Bactrim and Flagyl.  2.  Paroxysmal atrial fibrillation-rate well controlled.  Continue home medications.  Patient on metoprolol and digoxin. -Eliquis can be restarted at discharge.  3.  Chronic systolic CHF-well compensated at this time. -Restart Lasix.  Also on Entresto and Aldactone  4.  Acute right eye  conjunctivitis-also history of glaucoma.  Restarted  Eyedrops.  Ophthalmology follow-up as outpatient.    5.  Hyperthyroidism-on methimazole.    Patient will be discharged home today.  Will sign off.    All the records are reviewed and case discussed with Care Management/Social Workerr. Management plans discussed with the patient, family and they are in agreement.  CODE STATUS: Full Code  TOTAL TIME TAKING CARE OF THIS PATIENT: 38 minutes.   POSSIBLE D/C IN 2 DAYS, DEPENDING ON CLINICAL CONDITION.   Amanuel Sinkfield M.D on 05/16/2017 at 11:13 AM  Between 7am to 6pm - Pager - [phone]  After 6pm go to [email or web address] - Social research officer, governmentpassword [email or web address]  [facility] [place] [facility]  Office  [phone]  CC: Primary care physician; Dorcas CarrowJohnson, Megan P, DO

## 2017-05-16 NOTE — Progress Notes (Signed)
CC: Diverticulitis Subjective: Patient states his abdominal pain is much better he is tolerating a diet and feels well.  He wishes to go home later this afternoon.  Denies fevers or chills no nausea or vomiting. He had questions about his eye.  His right eye he states is "infected".  He has had this been going on for over a month and has seen an ophthalmologist and was given eyedrops.  He did not keep an appointment or missed an appointment and has not been able to reschedule one with the ophthalmologist since then.  He was in the emergency room and given a another antibiotic at that time.  He has antibiotics at home.  Objective: Vital signs in last 24 hours: Temp:  [97.4 F (36.3 C)-98.7 F (37.1 C)] 98.2 F (36.8 C) (12/10 0457) Pulse Rate:  [47-100] 47 (12/10 0457) Resp:  [18] 18 (12/10 0457) BP: (120-141)/(73-96) 131/91 (12/10 0457) SpO2:  [97 %-100 %] 100 % (12/10 0457) Last BM Date: 05/15/17  Intake/Output from previous day: 12/09 0701 - 12/10 0700 In: 860 [P.O.:660; IV Piggyback:200] Out: 735 [Urine:735] Intake/Output this shift: No intake/output data recorded.  Physical exam:  Vital signs reviewed and stable Right eye conjunctiva injected but no purulence no opacification.  Identifies 2 fingers.  Abdomen is soft minimally distended nontympanitic and nontender.  Calves are nontender  Lab Results: CBC  Recent Labs    05/13/17 1646 05/14/17 0351  WBC 9.6 7.1  HGB 16.3 15.6  HCT 48.4 45.6  PLT 183 158   BMET Recent Labs    05/14/17 0351 05/15/17 0325  NA 128* 131*  K 3.2* 3.8  CL 88* 94*  CO2 26 25  GLUCOSE 102* 101*  BUN 35* 23*  CREATININE 1.51* 0.99  CALCIUM 8.4* 8.4*   PT/INR No results for input(s): LABPROT, INR in the last 72 hours. ABG No results for input(s): PHART, HCO3 in the last 72 hours.  Invalid input(s): PCO2, PO2  Studies/Results: No results found.  Anti-infectives: Anti-infectives (From admission, onward)   Start     Dose/Rate  Route Frequency Ordered Stop   05/16/17 0000  sulfamethoxazole-trimethoprim (BACTRIM) 400-80 MG tablet     1 tablet Oral 2 times daily 05/16/17 0909     05/16/17 0000  metroNIDAZOLE (FLAGYL) 500 MG tablet     500 mg Oral 3 times daily 05/16/17 0909     05/14/17 0600  piperacillin-tazobactam (ZOSYN) IVPB 3.375 g     3.375 g 12.5 mL/hr over 240 Minutes Intravenous Every 8 hours 05/14/17 0017     05/13/17 2230  piperacillin-tazobactam (ZOSYN) IVPB 3.375 g  Status:  Discontinued     3.375 g 12.5 mL/hr over 240 Minutes Intravenous Every 8 hours 05/13/17 2225 05/14/17 0017   05/13/17 2115  piperacillin-tazobactam (ZOSYN) IVPB 3.375 g     3.375 g 100 mL/hr over 30 Minutes Intravenous  Once 05/13/17 2109 05/13/17 2257      Assessment/Plan:  Diverticulitis.  Patient doing very well will advance diet and discharge later today on oral antibiotics.  Patient has right eye conjunctivitis.  He is on eyedrops.  He will be sent home on Bactrim and Flagyl as well.  This would be for his diverticulitis.  He has not kept an appointment and has had trouble rescheduling but we will obtain an appointment for him on discharge with the [facility] ophthalmologist.  Lattie Haw, MD, FACS  05/16/2017

## 2017-05-16 NOTE — Care Management Important Message (Signed)
Important Message  Patient Details  Name: Eugene Henderson MRN: [id number] Date of Birth: [birth year]   Medicare Important Message Given:  Yes Signed notice given to patient and copy to HIM for scanning   Eber Hong, RN 05/16/2017, 3:16 PM

## 2017-05-16 NOTE — Discharge Summary (Signed)
Physician Discharge Summary  Patient ID: Eugene Henderson MRN: [id number] DOB/AGE: 66/06/1950 66 y.o.  Admit date: 05/13/2017 Discharge date: 05/16/2017   Discharge Diagnoses:  Active Problems:   Diverticulitis of large intestine with perforation without abscess or bleeding   Procedures: None  Hospital Course: This patient with acute diverticulitis.  He was admitted to the hospital for IV antibiotics.  He is doing well at this point and his pain is much improved.  He will be discharged on oral antibiotics to follow-up with Dr.Piscoya in 2 weeks.  He is instructed to return should he worsen at any point.  Also of note the patient has right eye conjunctivitis.  He has been on eyedrops at home but has had trouble getting an appointment for follow-up as he has already seen an ophthalmologist but missed an appointment.  We will arrange for ophthalmology outpatient follow-up as well.  Consults: IM  Disposition: 01-Home or Self Care   Allergies as of 05/16/2017   No Known Allergies     Medication List    TAKE these medications   acetaminophen 500 MG tablet Commonly known as:  TYLENOL Take 500 mg by mouth every 6 (six) hours as needed.   albuterol 108 (90 Base) MCG/ACT inhaler Commonly known as:  PROVENTIL HFA;VENTOLIN HFA Inhale 2 puffs into the lungs every 6 (six) hours as needed for wheezing or shortness of breath.   apixaban 5 MG Tabs tablet Commonly known as:  ELIQUIS Take 1 tablet (5 mg total) by mouth 2 (two) times daily.   budesonide-formoterol 160-4.5 MCG/ACT inhaler Commonly known as:  SYMBICORT Inhale 2 puffs into the lungs 2 (two) times daily.   cyclobenzaprine 10 MG tablet Commonly known as:  FLEXERIL TAKE 1 TABLET BY MOUTH EVERYDAY AT BEDTIME   digoxin 0.125 MG tablet Commonly known as:  LANOXIN Take 1 tablet (0.125 mg total) by mouth daily.   feeding supplement Liqd Take 1 Container by mouth 2 (two) times daily between meals.   furosemide 40 MG  tablet Commonly known as:  LASIX Take 1 tablet (40 mg total) by mouth daily as needed. For SOB/LE edema   gentamicin 0.3 % ophthalmic solution Commonly known as:  GARAMYCIN Place 1 drop into both eyes every 4 (four) hours.   methimazole 10 MG tablet Commonly known as:  TAPAZOLE Take 2 tablets (20 mg total) by mouth 2 (two) times daily.   metoprolol 200 MG 24 hr tablet Commonly known as:  TOPROL-XL Take 1 tablet (200 mg total) by mouth daily. Take with or immediately following a meal.   metroNIDAZOLE 500 MG tablet Commonly known as:  FLAGYL Take 1 tablet (500 mg total) by mouth 3 (three) times daily.   moxifloxacin 0.5 % ophthalmic solution Commonly known as:  VIGAMOX Place 1 drop into the right eye as directed.   naphazoline-pheniramine 0.025-0.3 % ophthalmic solution Commonly known as:  NAPHCON-A Place 1 drop into both eyes 4 (four) times daily as needed for eye irritation.   naproxen sodium 220 MG tablet Commonly known as:  ALEVE Take 220 mg by mouth 2 (two) times daily with a meal.   potassium chloride SA 20 MEQ tablet Commonly known as:  K-DUR,KLOR-CON Take 1 tablet (20 mEq total) by mouth daily.   ENTRESTO 24-26 MG Generic drug:  sacubitril-valsartan Take 1 tablet by mouth 2 (two) times daily.   sacubitril-valsartan 49-51 MG Commonly known as:  ENTRESTO Take 1 tablet by mouth 2 (two) times daily.   spironolactone 25 MG tablet Commonly known  as:  ALDACTONE Take 0.5 tablets (12.5 mg total) by mouth daily.   sulfamethoxazole-trimethoprim 400-80 MG tablet Commonly known as:  BACTRIM Take 1 tablet by mouth 2 (two) times daily.   tobramycin 0.3 % ophthalmic ointment Commonly known as:  TOBREX Place into both eyes every 4 (four) hours while awake.      Follow-up Information    Piscoya, Elita Quick, MD Follow up in 2 week(s).   Specialty:  Surgery Contact information: [address] [place] [address] [phone]           Lattie Haw, MD, FACS

## 2017-05-16 NOTE — Progress Notes (Signed)
Eugene Henderson to be D/C'd home per MD order.  Discussed prescriptions and follow up appointments with the patient. Prescriptions given to patient, medication list explained in detail. Pt verbalized understanding.    Vitals:   05/16/17 0457 05/16/17 0935  BP: (!) 131/91   Pulse: (!) 47 88  Resp: 18   Temp: 98.2 F (36.8 C)   SpO2: 100%     Skin clean, dry and intact without evidence of skin break down, no evidence of skin tears noted. IV catheter discontinued intact. Site without signs and symptoms of complications. Dressing and pressure applied. Pt denies pain at this time. No complaints noted.  An After Visit Summary was printed and given to the patient. Patient escorted via WC, and D/C home via private auto.  Mirren Gest A

## 2017-05-16 NOTE — Discharge Instructions (Signed)
Soft diet Resume home medications including eyedrops as prescribed Follow-up with Dr.Piscoya in 2 weeks Follow-up with ophthalmology within 2 weeks

## 2017-05-18 ENCOUNTER — Telehealth: Payer: Self-pay

## 2017-05-18 ENCOUNTER — Other Ambulatory Visit: Payer: Self-pay | Admitting: Family Medicine

## 2017-05-18 ENCOUNTER — Ambulatory Visit: Payer: Medicare Other

## 2017-05-18 DIAGNOSIS — Z748 Other problems related to care provider dependency: Secondary | ICD-10-CM

## 2017-05-18 LAB — CULTURE, BLOOD (ROUTINE X 2)
CULTURE: NO GROWTH
Culture: NO GROWTH
SPECIAL REQUESTS: ADEQUATE
SPECIAL REQUESTS: ADEQUATE

## 2017-05-18 MED ORDER — CYCLOBENZAPRINE HCL 10 MG PO TABS: 10.0000 mg | ORAL_TABLET | Freq: Every day | ORAL | 0 refills | Status: DC

## 2017-05-18 MED ORDER — ALBUTEROL SULFATE HFA 108 (90 BASE) MCG/ACT IN AERS: 2.0000 | INHALATION_SPRAY | Freq: Four times a day (QID) | RESPIRATORY_TRACT | 0 refills | Status: DC | PRN

## 2017-05-18 NOTE — Telephone Encounter (Signed)
Routing to provider  

## 2017-05-18 NOTE — Telephone Encounter (Signed)
Transition Care Management Follow-up Telephone Call   Date discharged?05/16/2017   How have you been since you were released from the hospital? better   Do you understand why you were in the hospital? yes   Do you understand the discharge instructions? yes   Where were you discharged to? home   Items Reviewed:  Medications reviewed: yes  Allergies reviewed: yes  Dietary changes reviewed: yes  Referrals reviewed: yes   Functional Questionnaire:   Activities of Daily Living (ADLs):   He states they are independent in the following: ambulation, bathing and hygiene, feeding, continence, grooming, toileting and dressing States they require assistance with the following: none   Any transportation issues/concerns?: yes, will place referral   Any patient concerns? no   Confirmed importance and date/time of follow-up visits scheduled yes  Provider Appointment booked with Dr.Landers on 05/20/2017 at 930am  Confirmed with patient if condition begins to worsen call PCP or go to the ER.  Patient was given the office number and encouraged to call back with question or concerns.  : yes

## 2017-05-20 ENCOUNTER — Ambulatory Visit: Payer: Medicare Other

## 2017-05-20 ENCOUNTER — Ambulatory Visit: Payer: Self-pay

## 2017-05-20 ENCOUNTER — Inpatient Hospital Stay: Payer: Medicare Other | Admitting: Family Medicine

## 2017-05-27 ENCOUNTER — Ambulatory Visit: Payer: Medicare Other

## 2017-05-27 ENCOUNTER — Inpatient Hospital Stay: Payer: Medicare Other | Admitting: Unknown Physician Specialty

## 2017-06-02 ENCOUNTER — Inpatient Hospital Stay: Payer: Medicare Other | Admitting: Surgery

## 2017-06-08 ENCOUNTER — Other Ambulatory Visit: Payer: Self-pay | Admitting: Family Medicine

## 2017-06-08 DIAGNOSIS — B182 Chronic viral hepatitis C: Secondary | ICD-10-CM

## 2017-06-09 LAB — FUNGUS CULTURE WITH STAIN

## 2017-06-09 LAB — FUNGAL ORGANISM REFLEX

## 2017-06-10 ENCOUNTER — Other Ambulatory Visit: Payer: Self-pay

## 2017-06-13 ENCOUNTER — Other Ambulatory Visit: Payer: Self-pay

## 2017-06-14 ENCOUNTER — Inpatient Hospital Stay: Payer: Medicare Other | Admitting: Family Medicine

## 2017-06-15 ENCOUNTER — Encounter: Payer: Self-pay | Admitting: Surgery

## 2017-06-15 ENCOUNTER — Ambulatory Visit (INDEPENDENT_AMBULATORY_CARE_PROVIDER_SITE_OTHER): Payer: Medicare (Managed Care) | Admitting: Surgery

## 2017-06-15 VITALS — BP 131/92 | HR 97 | Temp 98.4°F | Ht 73.0 in | Wt 261.0 lb

## 2017-06-15 DIAGNOSIS — K5732 Diverticulitis of large intestine without perforation or abscess without bleeding: Secondary | ICD-10-CM | POA: Diagnosis not present

## 2017-06-15 NOTE — Patient Instructions (Signed)
We would like for you to try Miralax for several days.  You will need to increase your water intake to at least 64 ounces of water daily. We would also like for you to take a stool softener daily.  Please see your follow up appointment listed below.   High-Fiber Diet Fiber, also called dietary fiber, is a type of carbohydrate found in fruits, vegetables, whole grains, and beans. A high-fiber diet can have many health benefits. Your health care provider may recommend a high-fiber diet to help:  Prevent constipation. Fiber can make your bowel movements more regular.  Lower your cholesterol.  Relieve hemorrhoids, uncomplicated diverticulosis, or irritable bowel syndrome.  Prevent overeating as part of a weight-loss plan.  Prevent heart disease, type 2 diabetes, and certain cancers.  What is my plan? The recommended daily intake of fiber includes:  38 grams for men under age 29.  30 grams for men over age 44.  25 grams for women under age 50.  21 grams for women over age 72.  You can get the recommended daily intake of dietary fiber by eating a variety of fruits, vegetables, grains, and beans. Your health care provider may also recommend a fiber supplement if it is not possible to get enough fiber through your diet. What do I need to know about a high-fiber diet?  Fiber supplements have not been widely studied for their effectiveness, so it is better to get fiber through food sources.  Always check the fiber content on thenutrition facts label of any prepackaged food. Look for foods that contain at least 5 grams of fiber per serving.  Ask your dietitian if you have questions about specific foods that are related to your condition, especially if those foods are not listed in the following section.  Increase your daily fiber consumption gradually. Increasing your intake of dietary fiber too quickly may cause bloating, cramping, or gas.  Drink plenty of water. Water helps you to  digest fiber. What foods can I eat? Grains Whole-grain breads. Multigrain cereal. Oats and oatmeal. Brown rice. Barley. Bulgur wheat. Millet. Bran muffins. Popcorn. Rye wafer crackers. Vegetables Sweet potatoes. Spinach. Kale. Artichokes. Cabbage. Broccoli. Green peas. Carrots. Squash. Fruits Berries. Pears. Apples. Oranges. Avocados. Prunes and raisins. Dried figs. Meats and Other Protein Sources Navy, kidney, pinto, and soy beans. Split peas. Lentils. Nuts and seeds. Dairy Fiber-fortified yogurt. Beverages Fiber-fortified soy milk. Fiber-fortified orange juice. Other Fiber bars. The items listed above may not be a complete list of recommended foods or beverages. Contact your dietitian for more options. What foods are not recommended? Grains White bread. Pasta made with refined flour. White rice. Vegetables Fried potatoes. Canned vegetables. Well-cooked vegetables. Fruits Fruit juice. Cooked, strained fruit. Meats and Other Protein Sources Fatty cuts of meat. Fried Environmental education officer or fried fish. Dairy Milk. Yogurt. Cream cheese. Sour cream. Beverages Soft drinks. Other Cakes and pastries. Butter and oils. The items listed above may not be a complete list of foods and beverages to avoid. Contact your dietitian for more information. What are some tips for including high-fiber foods in my diet?  Eat a wide variety of high-fiber foods.  Make sure that half of all grains consumed each day are whole grains.  Replace breads and cereals made from refined flour or white flour with whole-grain breads and cereals.  Replace white rice with brown rice, bulgur wheat, or millet.  Start the day with a breakfast that is high in fiber, such as a cereal that contains at least  5 grams of fiber per serving.  Use beans in place of meat in soups, salads, or pasta.  Eat high-fiber snacks, such as berries, raw vegetables, nuts, or popcorn. This information is not intended to replace advice given to  you by your health care provider. Make sure you discuss any questions you have with your health care provider. Document Released: 05/24/2005 Document Revised: 10/30/2015 Document Reviewed: 11/06/2013 Elsevier Interactive Patient Education  2018 [employer]. Constipation, Adult Constipation is when a person:  Poops (has a bowel movement) fewer times in a week than normal.  Has a hard time pooping.  Has poop that is dry, hard, or bigger than normal.  Follow these instructions at home: Eating and drinking   Eat foods that have a lot of fiber, such as: ? Fresh fruits and vegetables. ? Whole grains. ? Beans.  Eat less of foods that are high in fat, low in fiber, or overly processed, such as: ? [place] fries. ? Hamburgers. ? Cookies. ? Candy. ? Soda.  Drink enough fluid to keep your pee (urine) clear or pale yellow. General instructions  Exercise regularly or as told by your doctor.  Go to the restroom when you feel like you need to poop. Do not hold it in.  Take over-the-counter and prescription medicines only as told by your doctor. These include any fiber supplements.  Do pelvic floor retraining exercises, such as: ? Doing deep breathing while relaxing your lower belly (abdomen). ? Relaxing your pelvic floor while pooping.  Watch your condition for any changes.  Keep all follow-up visits as told by your doctor. This is important. Contact a doctor if:  You have pain that gets worse.  You have a fever.  You have not pooped for 4 days.  You throw up (vomit).  You are not hungry.  You lose weight.  You are bleeding from the anus.  You have thin, pencil-like poop (stool). Get help right away if:  You have a fever, and your symptoms suddenly get worse.  You leak poop or have blood in your poop.  Your belly feels hard or bigger than normal (is bloated).  You have very bad belly pain.  You feel dizzy or you faint. This information is not intended to  replace advice given to you by your health care provider. Make sure you discuss any questions you have with your health care provider. Document Released: 11/10/2007 Document Revised: 12/12/2015 Document Reviewed: 11/12/2015 Elsevier Interactive Patient Education  2018 [employer].

## 2017-06-15 NOTE — Progress Notes (Signed)
Surgical Clinic Progress/Follow-up Note   HPI:  67 y.o. Male presents to clinic for delayed follow-up evaluation >1 month after he was discharged following inpatient admission for uncomplicated sigmoid colonic diverticulitis. Patient reports he completed his antibiotics as prescribed, but says he continues to experience intermittent crampy abdominal pain "all over" that he says never changed from prior to admission, during his admission, or since his discharge. He also says doesn't have BM's every day, but refused to provide any further clarification, saying he is not concerned about his abdomen, but rather about shaking extremities that he attributes to potassium. He otherwise denies N/V, fever/chills, CP, or SOB and continues to smoke. When asked about his thoracic aortic aneurysm, he denies knowledge of it and denies seeing Dr. Gilda Crease (vascular surgery) despite records otherwise.  Review of Systems:  Constitutional: denies any other weight loss, fever, chills, or sweats  Eyes: denies any other vision changes, history of eye injury  ENT: denies sore throat, hearing problems  Respiratory: denies shortness of breath, wheezing  Cardiovascular: denies chest pain, palpitations  Gastrointestinal: abdominal pain, N/V, and bowel function as per HPI Musculoskeletal: denies any other joint pains or cramps  Skin: Denies any other rashes or skin discolorations  Neurological: denies any other headache, dizziness, weakness  Psychiatric: denies any other depression, anxiety  All other review of systems: otherwise negative   Vital Signs:  BP (!) 131/92   Pulse 97   Temp 98.4 F (36.9 C) (Oral)   Ht 6\' 1"  (1.854 m)   Wt 261 lb (118.4 kg)   BMI 34.43 kg/m    Physical Exam:  Constitutional:  -- Normal body habitus  -- Awake, alert, and oriented x3  Eyes:  -- Pupils equally round and reactive to light  -- No scleral icterus  Ear, nose, throat:  -- No jugular venous distension  -- No nasal  drainage, bleeding Pulmonary:  -- No crackles -- Equal breath sounds bilaterally -- Breathing non-labored at rest Cardiovascular:  -- S1, S2 present  -- No pericardial rubs  Gastrointestinal:  -- Soft and non-distended with mild B/L lower quadrant tenderness to deep palpation, no guarding/rebound  -- No abdominal masses appreciated, pulsatile or otherwise  Musculoskeletal / Integumentary:  -- Wounds or skin discoloration: None appreciated  -- Extremities: B/L UE and LE FROM, hands and feet warm, no edema  Neurologic:  -- Motor function: intact and symmetric  -- Sensation: intact and symmetric   Assessment:  67 y.o. yo Male with a problem list including...  Patient Active Problem List   Diagnosis Date Noted  . Diverticulitis   . Diverticulitis of large intestine with perforation without abscess or bleeding 05/13/2017  . COPD (chronic obstructive pulmonary disease) (HCC) 02/03/2017  . 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) 12/15/2016  . AAA (abdominal aortic aneurysm) (HCC) 11/29/2016  . Chronic low back pain 11/29/2016  . Alcohol abuse 11/29/2016  . Hyponatremia 11/22/2016  . Chronic systolic heart failure (HCC) 08/19/2016  . Tobacco use 08/19/2016  . Snoring 08/19/2016  . Persistent atrial fibrillation (HCC) 07/28/2016  . Hyperthyroidism 07/28/2016  . Noncompliance with medications 07/28/2016  . Essential hypertension 07/28/2016    presents to clinic for delayed follow-up evaluation of acute sigmoid colonic diverticulitis with chronic constipation >1 month after patient was discharged home from [facility] with outpatient antibiotics.  Plan:   - bowel regimen prescribed  - maintain hydration and high-fiber diet  - smoking cessation strongly encouraged  - encouraged patient to follow-up with PMD for his  concerns re: "shaking" he attributes to potassium  - vascular surgery follow-up for thoracic aortic aneurysm (sees Dr. Schnier q69months)  - patient refuses colonoscopy 6  weeks following resolution of diverticulitis  - return to clinic in 1 - 2 weeks, instructed to call office if questions or concerns  - if concerns for recurrent or persistent diverticulitis, may need repeat CT  All of the above recommendations were discussed with the patient, and all of patient's questions were answered to his expressed satisfaction.  -- Scherrie Gerlach Earlene Plater, MD, RPVI [place]: [facility] General Surgery - Partnering for exceptional care. Office: [phone]

## 2017-06-16 ENCOUNTER — Ambulatory Visit: Payer: Medicare (Managed Care) | Admitting: Gastroenterology

## 2017-06-16 DIAGNOSIS — H16001 Unspecified corneal ulcer, right eye: Secondary | ICD-10-CM | POA: Diagnosis not present

## 2017-06-17 ENCOUNTER — Encounter: Payer: Self-pay | Admitting: Surgery

## 2017-06-21 DIAGNOSIS — H16001 Unspecified corneal ulcer, right eye: Secondary | ICD-10-CM | POA: Diagnosis not present

## 2017-06-24 DIAGNOSIS — H16009 Unspecified corneal ulcer, unspecified eye: Secondary | ICD-10-CM | POA: Insufficient documentation

## 2017-06-27 ENCOUNTER — Ambulatory Visit: Payer: Medicare (Managed Care) | Admitting: Surgery

## 2017-06-27 ENCOUNTER — Encounter: Payer: Self-pay | Admitting: Gastroenterology

## 2017-06-27 ENCOUNTER — Ambulatory Visit: Payer: Medicare (Managed Care) | Admitting: Gastroenterology

## 2017-06-28 NOTE — Progress Notes (Deleted)
Patient ID: Eugene Henderson, male    DOB: [birth year], 67 y.o.   MRN: [id number]  HPI  [name] is a 67 y/o male with a history of atrial fibrillation, hyperthyroidism, HTN, asthma, arthritis, current tobacco use and chronic heart failure.   Last echo done 02/02/17 was 30-35%. Echo done 07/29/16 and showed an EF of 25-30% along with moderate MR and moderately increased PA pressure of 50 mm Hg. 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   Admitted 05/13/17 due to acute diverticulitis. Initially needed IV antibiotics and then transitioned to oral medications. Discharged after 3 days. Was in the ED 05/06/17 due to right eye bacterial conjunctivitis. Was given eye drops and released.   He presents today for his follow-up appointment with a chief complaint of   Past Medical History:  Diagnosis Date  . Arthritis   . Asthma   . CHF (congestive heart failure) (HCC)    a. EF 25-30% by echo in 07/2016 with cath showing no significant CAD b. 01/2017: EF 30-35% with diffuse HK and moderate MR  . Hypertension   . Hyperthyroidism   . Noncompliance   . Persistent atrial fibrillation [facility])    Past Surgical History:  Procedure Laterality Date  . JOINT REPLACEMENT Right   . RIGHT/LEFT HEART CATH AND CORONARY ANGIOGRAPHY N/A 08/02/2016   Procedure: Right/Left Heart Cath and Coronary Angiography;  Surgeon: Iran Ouch, MD;  Location: [facility] INVASIVE CV LAB;  Service: Cardiovascular;  Laterality: N/A;  . TESTICLE SURGERY     Patient states that he had to have the tube fixed.   Family History  Problem Relation Age of Onset  . Diabetes Mother   . Diabetes Father   . Heart disease Brother   . Heart attack Brother   . Diabetes Brother   . Kidney failure Brother    Social History   Tobacco Use  . Smoking status: Current Some Day Smoker    Packs/day: 0.25    Years: 25.00    Pack years: 6.25    Types: Cigarettes  .  Smokeless tobacco: Never Used  Substance Use Topics  . Alcohol use: Yes    Comment: Socially - once a month   No Known Allergies    Review of Systems  Constitutional: Negative for appetite change and fatigue.  HENT: Negative for congestion, postnasal drip and sore throat.   Eyes: Negative.   Respiratory: Positive for chest tightness and shortness of breath (when getting up steps). Negative for cough and wheezing.   Cardiovascular: Negative for chest pain, palpitations and leg swelling.  Gastrointestinal: Negative for abdominal distention and abdominal pain.  Endocrine: Negative.   Genitourinary: Negative.   Musculoskeletal: Positive for back pain. Negative for arthralgias.  Skin: Negative.   Allergic/Immunologic: Negative.   Neurological: Positive for dizziness. Negative for weakness and light-headedness.  Hematological: Negative for adenopathy. Does not bruise/bleed easily.  Psychiatric/Behavioral: Positive for sleep disturbance (sleep more during the day and watch tv at night). Negative for dysphoric mood. The patient is not nervous/anxious.     Physical Exam  Constitutional: He is oriented to person, place, and time. He appears well-developed and well-nourished.  HENT:  Head: Normocephalic and atraumatic.  Neck: Normal range of motion. Neck supple. No JVD present.  Cardiovascular: Normal rate. An irregular rhythm present.  Pulmonary/Chest: Effort normal. He has no wheezes. He has no rales.  Abdominal: Soft. He exhibits no distension. There is no tenderness.  Musculoskeletal:  He exhibits no edema or tenderness.  Neurological: He is alert and oriented to person, place, and time.  Skin: Skin is warm and dry.  Psychiatric: He has a normal mood and affect. His behavior is normal. Thought content normal.  Nursing note and vitals reviewed.  Assessment & Plan:  1: Chronic heart failure with reduced ejection fraction- - NYHA class II - euvolemic today - down 3 pounds since last  visit; admits to not weighing daily at home and he was reminded him to call for an overnight weight gain of >2 pounds or a weekly weight gain of >5 pounds - still using salt on some foods;  Instructed to not add any salt to food. Reminded him to follow a 2000mg  sodium diet.  - BMP from 05/15/17 reviewed which shows sodium 131, potassium 3.8 and GFR >60 - BNP from 11/22/16 was 44.0 - saw cardiologist Kirke Corin) 11/02/16  2: HTN- - BP slightly elevated; increase in Entresto may help BP  - Endorses having bad HA's when blood pressure elevated  - saw PCP Laural Benes) on 05/09/17  3: Atrial fibrillation- - currently on apixaban, digoxin and max metoprolol XL - last dig level on 11/22/16 was <0.2; patient states he is taking once daily   4. COPD- - Currently controlled on Symbicort - Has not had to use albuterol inhaler   Patient did not bring his medications nor a list. Each medication was verbally reviewed with the patient and he was encouraged to bring the bottles to every visit to confirm accuracy of list.

## 2017-06-29 ENCOUNTER — Ambulatory Visit: Payer: Medicare (Managed Care) | Admitting: Family

## 2017-06-30 MED ORDER — ACETAMINOPHEN 325 MG PO TABS
650.00 mg | ORAL_TABLET | ORAL | Status: DC
Start: ? — End: 2017-06-30

## 2017-06-30 MED ORDER — CYCLOBENZAPRINE HCL 10 MG PO TABS
10.00 mg | ORAL_TABLET | ORAL | Status: DC
Start: ? — End: 2017-06-30

## 2017-06-30 MED ORDER — APIXABAN 5 MG PO TABS
5.00 mg | ORAL_TABLET | ORAL | Status: DC
Start: 2017-06-30 — End: 2017-06-30

## 2017-06-30 MED ORDER — PROMETHAZINE HCL 12.5 MG PO TABS
12.50 mg | ORAL_TABLET | ORAL | Status: DC
Start: ? — End: 2017-06-30

## 2017-06-30 MED ORDER — GENERIC EXTERNAL MEDICATION
1.00 | Status: DC
Start: 2017-06-30 — End: 2017-06-30

## 2017-06-30 MED ORDER — GENERIC EXTERNAL MEDICATION
2.00 | Status: DC
Start: 2017-06-30 — End: 2017-06-30

## 2017-06-30 MED ORDER — SACUBITRIL-VALSARTAN 24-26 MG PO TABS
1.00 | ORAL_TABLET | ORAL | Status: DC
Start: 2017-07-01 — End: 2017-06-30

## 2017-06-30 MED ORDER — METOPROLOL SUCCINATE ER 200 MG PO TB24
200.00 mg | ORAL_TABLET | ORAL | Status: DC
Start: 2017-07-01 — End: 2017-06-30

## 2017-06-30 MED ORDER — METHIMAZOLE 10 MG PO TABS
20.00 mg | ORAL_TABLET | ORAL | Status: DC
Start: 2017-07-01 — End: 2017-06-30

## 2017-06-30 MED ORDER — ATROPINE SULFATE 1 % OP SOLN
1.00 | OPHTHALMIC | Status: DC
Start: 2017-07-01 — End: 2017-06-30

## 2017-06-30 MED ORDER — POTASSIUM CHLORIDE CRYS ER 10 MEQ PO TBCR
40.00 | EXTENDED_RELEASE_TABLET | ORAL | Status: DC
Start: 2017-07-01 — End: 2017-06-30

## 2017-06-30 MED ORDER — GENERIC EXTERNAL MEDICATION
2.00 | Status: DC
Start: ? — End: 2017-06-30

## 2017-06-30 MED ORDER — VALACYCLOVIR HCL 500 MG PO TABS
1000.00 mg | ORAL_TABLET | ORAL | Status: DC
Start: 2017-06-30 — End: 2017-06-30

## 2017-06-30 MED ORDER — DIGOXIN 125 MCG PO TABS
125.00 | ORAL_TABLET | ORAL | Status: DC
Start: 2017-07-01 — End: 2017-06-30

## 2017-07-05 ENCOUNTER — Encounter: Payer: Self-pay | Admitting: Family

## 2017-07-05 ENCOUNTER — Ambulatory Visit: Payer: Medicare (Managed Care) | Attending: Family | Admitting: Family

## 2017-07-05 VITALS — BP 140/86 | HR 117 | Resp 18 | Ht 72.0 in | Wt 257.5 lb

## 2017-07-05 DIAGNOSIS — Z79899 Other long term (current) drug therapy: Secondary | ICD-10-CM | POA: Insufficient documentation

## 2017-07-05 DIAGNOSIS — Z7901 Long term (current) use of anticoagulants: Secondary | ICD-10-CM | POA: Insufficient documentation

## 2017-07-05 DIAGNOSIS — E0590 Thyrotoxicosis, unspecified without thyrotoxic crisis or storm: Secondary | ICD-10-CM | POA: Diagnosis not present

## 2017-07-05 DIAGNOSIS — J45909 Unspecified asthma, uncomplicated: Secondary | ICD-10-CM | POA: Insufficient documentation

## 2017-07-05 DIAGNOSIS — H169 Unspecified keratitis: Secondary | ICD-10-CM | POA: Diagnosis not present

## 2017-07-05 DIAGNOSIS — F17210 Nicotine dependence, cigarettes, uncomplicated: Secondary | ICD-10-CM | POA: Insufficient documentation

## 2017-07-05 DIAGNOSIS — I110 Hypertensive heart disease with heart failure: Secondary | ICD-10-CM | POA: Diagnosis not present

## 2017-07-05 DIAGNOSIS — I10 Essential (primary) hypertension: Secondary | ICD-10-CM

## 2017-07-05 DIAGNOSIS — M1990 Unspecified osteoarthritis, unspecified site: Secondary | ICD-10-CM | POA: Insufficient documentation

## 2017-07-05 DIAGNOSIS — Z720 Tobacco use: Secondary | ICD-10-CM

## 2017-07-05 DIAGNOSIS — I5022 Chronic systolic (congestive) heart failure: Secondary | ICD-10-CM | POA: Insufficient documentation

## 2017-07-05 DIAGNOSIS — R0602 Shortness of breath: Secondary | ICD-10-CM | POA: Diagnosis present

## 2017-07-05 DIAGNOSIS — I481 Persistent atrial fibrillation: Secondary | ICD-10-CM | POA: Insufficient documentation

## 2017-07-05 DIAGNOSIS — I4819 Other persistent atrial fibrillation: Secondary | ICD-10-CM

## 2017-07-05 MED ORDER — SPIRONOLACTONE 25 MG PO TABS: 12.5000 mg | ORAL_TABLET | Freq: Every day | ORAL | 5 refills | Status: DC

## 2017-07-05 MED ORDER — ALBUTEROL SULFATE HFA 108 (90 BASE) MCG/ACT IN AERS: 2.0000 | INHALATION_SPRAY | Freq: Four times a day (QID) | RESPIRATORY_TRACT | 3 refills | Status: DC | PRN

## 2017-07-05 MED ORDER — POTASSIUM CHLORIDE CRYS ER 20 MEQ PO TBCR: 20.0000 meq | EXTENDED_RELEASE_TABLET | Freq: Every day | ORAL | 3 refills | Status: DC

## 2017-07-05 MED ORDER — BUDESONIDE-FORMOTEROL FUMARATE 160-4.5 MCG/ACT IN AERO: 2.0000 | INHALATION_SPRAY | Freq: Two times a day (BID) | RESPIRATORY_TRACT | 3 refills | Status: DC

## 2017-07-05 NOTE — Patient Instructions (Signed)
Continue weighing daily and call for an overnight weight gain of > 2 pounds or a weekly weight gain of >5 pounds.    Smoking Cessation Quitting smoking is important to your health and has many advantages. However, it is not always easy to quit since nicotine is a very addictive drug. Oftentimes, people try 3 times or more before being able to quit. This document explains the best ways for you to prepare to quit smoking. Quitting takes hard work and a lot of effort, but you can do it. ADVANTAGES OF QUITTING SMOKING  You will live longer, feel better, and live better.  Your body will feel the impact of quitting smoking almost immediately.  Within 20 minutes, blood pressure decreases. Your pulse returns to its normal level.  After 8 hours, carbon monoxide levels in the blood return to normal. Your oxygen level increases.  After 24 hours, the chance of having a heart attack starts to decrease. Your breath, hair, and body stop smelling like smoke.  After 48 hours, damaged nerve endings begin to recover. Your sense of taste and smell improve.  After 72 hours, the body is virtually free of nicotine. Your bronchial tubes relax and breathing becomes easier.  After 2 to 12 weeks, lungs can hold more air. Exercise becomes easier and circulation improves.  The risk of having a heart attack, stroke, cancer, or lung disease is greatly reduced.  After 1 year, the risk of coronary heart disease is cut in half.  After 5 years, the risk of stroke falls to the same as a nonsmoker.  After 10 years, the risk of lung cancer is cut in half and the risk of other cancers decreases significantly.  After 15 years, the risk of coronary heart disease drops, usually to the level of a nonsmoker.  If you are pregnant, quitting smoking will improve your chances of having a healthy baby.  The people you live with, especially any children, will be healthier.  You will have extra money to spend on things other  than cigarettes. QUESTIONS TO THINK ABOUT BEFORE ATTEMPTING TO QUIT You may want to talk about your answers with your health care provider.  Why do you want to quit?  If you tried to quit in the past, what helped and what did not?  What will be the most difficult situations for you after you quit? How will you plan to handle them?  Who can help you through the tough times? Your family? Friends? A health care provider?  What pleasures do you get from smoking? What ways can you still get pleasure if you quit? Here are some questions to ask your health care provider:  How can you help me to be successful at quitting?  What medicine do you think would be best for me and how should I take it?  What should I do if I need more help?  What is smoking withdrawal like? How can I get information on withdrawal? GET READY  Set a quit date.  Change your environment by getting rid of all cigarettes, ashtrays, matches, and lighters in your home, car, or work. Do not let people smoke in your home.  Review your past attempts to quit. Think about what worked and what did not. GET SUPPORT AND ENCOURAGEMENT You have a better chance of being successful if you have help. You can get support in many ways.  Tell your family, friends, and coworkers that you are going to quit and need their support. Ask   them not to smoke around you.  Get individual, group, or telephone counseling and support. Programs are available at local hospitals and health centers. Call your local health department for information about programs in your area.  Spiritual beliefs and practices may help some smokers quit.  Download a "quit meter" on your computer to keep track of quit statistics, such as how long you have gone without smoking, cigarettes not smoked, and money saved.  Get a self-help book about quitting smoking and staying off tobacco. LEARN NEW SKILLS AND BEHAVIORS  Distract yourself from urges to smoke. Talk to  someone, go for a walk, or occupy your time with a task.  Change your normal routine. Take a different route to work. Drink tea instead of coffee. Eat breakfast in a different place.  Reduce your stress. Take a hot bath, exercise, or read a book.  Plan something enjoyable to do every day. Reward yourself for not smoking.  Explore interactive web-based programs that specialize in helping you quit. GET MEDICINE AND USE IT CORRECTLY Medicines can help you stop smoking and decrease the urge to smoke. Combining medicine with the above behavioral methods and support can greatly increase your chances of successfully quitting smoking.  Nicotine replacement therapy helps deliver nicotine to your body without the negative effects and risks of smoking. Nicotine replacement therapy includes nicotine gum, lozenges, inhalers, nasal sprays, and skin patches. Some may be available over-the-counter and others require a prescription.  Antidepressant medicine helps people abstain from smoking, but how this works is unknown. This medicine is available by prescription.  Nicotinic receptor partial agonist medicine simulates the effect of nicotine in your brain. This medicine is available by prescription. Ask your health care provider for advice about which medicines to use and how to use them based on your health history. Your health care provider will tell you what side effects to look out for if you choose to be on a medicine or therapy. Carefully read the information on the package. Do not use any other product containing nicotine while using a nicotine replacement product.  RELAPSE OR DIFFICULT SITUATIONS Most relapses occur within the first 3 months after quitting. Do not be discouraged if you start smoking again. Remember, most people try several times before finally quitting. You may have symptoms of withdrawal because your body is used to nicotine. You may crave cigarettes, be irritable, feel very hungry, cough  often, get headaches, or have difficulty concentrating. The withdrawal symptoms are only temporary. They are strongest when you first quit, but they will go away within 10-14 days. To reduce the chances of relapse, try to:  Avoid drinking alcohol. Drinking lowers your chances of successfully quitting.  Reduce the amount of caffeine you consume. Once you quit smoking, the amount of caffeine in your body increases and can give you symptoms, such as a rapid heartbeat, sweating, and anxiety.  Avoid smokers because they can make you want to smoke.  Do not let weight gain distract you. Many smokers will gain weight when they quit, usually less than 10 pounds. Eat a healthy diet and stay active. You can always lose the weight gained after you quit.  Find ways to improve your mood other than smoking. FOR MORE INFORMATION  www.smokefree.gov  Document Released: 05/18/2001 Document Revised: 10/08/2013 Document Reviewed: 09/02/2011 [employer] Patient Information 2015 [employer]. This information is not intended to replace advice given to you by your health care provider. Make sure you discuss any questions you have with your   health care provider.  

## 2017-07-05 NOTE — Progress Notes (Signed)
Patient ID: Eugene Henderson, male    DOB: [birth year], 67 y.o.   MRN: [id number]  HPI  Mr Gelin is a 67 y/o male with a history of atrial fibrillation, hyperthyroidism, HTN, asthma, arthritis, current tobacco use and chronic heart failure.   Last echo done 02/02/17 was 30-35%. Echo done 07/29/16 and showed an EF of 25-30% along with moderate MR and moderately increased PA pressure of 50 mm Hg. 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   Admitted 06/24/16 due to corneal ulcer and possible secondary endophthalmitis. Hospital stay was complicated by hypokalemia and hyponatremia secondary to volume depletion. Ophthalmology consult obtained. Antibiotics were given. Discharged after 6 days.  Admitted 05/13/17 due to acute diverticulitis. Initially needed IV antibiotics and then transitioned to oral medications. Discharged after 3 days. Was in the ED 05/06/17 due to right eye bacterial conjunctivitis. Was given eye drops and released.   He presents today for his follow-up appointment with a chief complaint of minimal shortness of breath upon moderate exertion. He describes this as chronic in nature having been present for several years. He has associated dizziness, difficulty sleeping, chest tightness, blurry vision and right eye pain. He denies any fatigue, cough, wheezing, edema, palpitations, abdominal distention or weight gain. Biggest concern today is of the pain/drainage and decreased vision in his right eye.   Past Medical History:  Diagnosis Date  . Arthritis   . Asthma   . CHF (congestive heart failure) (HCC)    a. EF 25-30% by echo in 07/2016 with cath showing no significant CAD b. 01/2017: EF 30-35% with diffuse HK and moderate MR  . Hypertension   . Hyperthyroidism   . Noncompliance   . Persistent atrial fibrillation [facility])    Past Surgical History:  Procedure Laterality Date  . JOINT REPLACEMENT Right   .  RIGHT/LEFT HEART CATH AND CORONARY ANGIOGRAPHY N/A 08/02/2016   Procedure: Right/Left Heart Cath and Coronary Angiography;  Surgeon: Iran Ouch, MD;  Location: [facility] INVASIVE CV LAB;  Service: Cardiovascular;  Laterality: N/A;  . TESTICLE SURGERY     Patient states that he had to have the tube fixed.   Family History  Problem Relation Age of Onset  . Diabetes Mother   . Diabetes Father   . Heart disease Brother   . Heart attack Brother   . Diabetes Brother   . Kidney failure Brother    Social History   Tobacco Use  . Smoking status: Current Some Day Smoker    Packs/day: 0.25    Years: 25.00    Pack years: 6.25    Types: Cigarettes  . Smokeless tobacco: Never Used  Substance Use Topics  . Alcohol use: Yes    Comment: Socially - once a month   No Known Allergies  Prior to Admission medications   Medication Sig Start Date End Date Taking? Authorizing Provider  acetaminophen (TYLENOL) 500 MG tablet Take 500 mg by mouth every 6 (six) hours as needed.   Yes [provider]  albuterol (PROVENTIL HFA;VENTOLIN HFA) 108 (90 Base) MCG/ACT inhaler Inhale 2 puffs into the lungs every 6 (six) hours as needed for wheezing or shortness of breath. 07/05/17  Yes Clarisa Kindred A, FNP  apixaban (ELIQUIS) 5 MG TABS tablet Take 1 tablet (5 mg total) by mouth 2 (two) times daily. 05/09/17  Yes Blye, Megan P, DO  budesonide-formoterol (SYMBICORT) 160-4.5 MCG/ACT inhaler Inhale 2 puffs into the lungs 2 (two) times  daily. 07/05/17  Yes Clarisa Kindred A, FNP  cyclobenzaprine (FLEXERIL) 10 MG tablet Take 1 tablet (10 mg total) by mouth at bedtime. 05/18/17  Yes Grima, Megan P, DO  digoxin (LANOXIN) 0.125 MG tablet Take 1 tablet (0.125 mg total) by mouth daily. 05/09/17  Yes Beaver, Megan P, DO  feeding supplement (BOOST / RESOURCE BREEZE) LIQD Take 1 Container by mouth 2 (two) times daily between meals. 11/26/16  Yes Delfino Lovett, MD  furosemide (LASIX) 40 MG tablet Take 1 tablet (40 mg total)  by mouth daily as needed. For SOB/LE edema Patient taking differently: Take 40 mg by mouth 2 (two) times daily. For SOB/LE edema 05/09/17  Yes Oliphant, Megan P, DO  methimazole (TAPAZOLE) 10 MG tablet Take 2 tablets (20 mg total) by mouth 2 (two) times daily. 05/09/17  Yes Torrens, Megan P, DO  metoprolol (TOPROL-XL) 200 MG 24 hr tablet Take 1 tablet (200 mg total) by mouth daily. Take with or immediately following a meal. 05/09/17  Yes Siverson, Megan P, DO  moxifloxacin (VIGAMOX) 0.5 % ophthalmic solution Place 1 drop into the right eye as directed.   Yes [provider]  sacubitril-valsartan (ENTRESTO) 24-26 MG Take 1 tablet by mouth 2 (two) times daily.   Yes [provider]  spironolactone (ALDACTONE) 25 MG tablet Take 0.5 tablets (12.5 mg total) by mouth daily. 07/05/17  Yes Verna Desrocher, Inetta Fermo A, FNP  gentamicin (GARAMYCIN) 0.3 % ophthalmic solution Place 1 drop into both eyes every 4 (four) hours. Patient not taking: Reported on 07/05/2017 05/06/17   Joni Reining, PA-C  metroNIDAZOLE (FLAGYL) 500 MG tablet Take 1 tablet (500 mg total) by mouth 3 (three) times daily. Patient not taking: Reported on 07/05/2017 05/16/17   Lattie Haw, MD  naphazoline-pheniramine (NAPHCON-A) 0.025-0.3 % ophthalmic solution Place 1 drop into both eyes 4 (four) times daily as needed for eye irritation. Patient not taking: Reported on 07/05/2017 05/06/17   Joni Reining, PA-C  potassium chloride SA (K-DUR,KLOR-CON) 20 MEQ tablet Take 1 tablet (20 mEq total) by mouth daily. 07/05/17   Delma Freeze, FNP  prednisoLONE acetate (PRED FORTE) 1 % ophthalmic suspension Place 1 drop into the right eye 4 (four) times daily.    [provider]  tobramycin (TOBREX) 0.3 % ophthalmic ointment Place into both eyes every 4 (four) hours while awake. Patient not taking: Reported on 07/05/2017 05/16/17   Lattie Haw, MD    Review of Systems  Constitutional: Negative for appetite change and fatigue.   HENT: Negative for congestion, postnasal drip and sore throat.   Eyes: Positive for pain (right eye), discharge (especially in the morning) and visual disturbance (blurry vision in right eye).  Respiratory: Positive for chest tightness (3 days ago) and shortness of breath (when getting up steps). Negative for cough and wheezing.   Cardiovascular: Negative for chest pain, palpitations and leg swelling.  Gastrointestinal: Negative for abdominal distention and abdominal pain.  Endocrine: Negative.   Genitourinary: Negative.   Musculoskeletal: Positive for arthralgias (knee pain) and back pain.  Skin: Negative.   Allergic/Immunologic: Negative.   Neurological: Positive for dizziness. Negative for weakness and light-headedness.  Hematological: Negative for adenopathy. Does not bruise/bleed easily.  Psychiatric/Behavioral: Positive for sleep disturbance (sleep more during the day and watch tv at night). Negative for dysphoric mood. The patient is not nervous/anxious.    Vitals:   07/05/17 1020 07/05/17 1102  BP: (!) 155/105 140/86  Pulse: (!) 117   Resp: 18  SpO2: 100%   Weight: 257 lb 8 oz (116.8 kg)   Height: 6' (1.829 m)    Wt Readings from Last 3 Encounters:  07/05/17 257 lb 8 oz (116.8 kg)  06/15/17 261 lb (118.4 kg)  05/13/17 266 lb 14.4 oz (121.1 kg)   Lab Results  Component Value Date   CREATININE 0.99 05/15/2017   CREATININE 1.51 (H) 05/14/2017   CREATININE 1.76 (H) 05/13/2017    Physical Exam  Constitutional: He is oriented to person, place, and time. He appears well-developed and well-nourished.  HENT:  Head: Normocephalic and atraumatic.  Eyes: Right eye exhibits discharge. Right conjunctiva is injected. Right eye exhibits abnormal extraocular motion.  Right eye iris is clouded over  Neck: Normal range of motion. Neck supple. No JVD present.  Cardiovascular: Normal rate. An irregular rhythm present.  Pulmonary/Chest: Effort normal. He has no wheezes. He has no  rales.  Abdominal: Soft. He exhibits no distension. There is no tenderness.  Musculoskeletal: He exhibits no edema or tenderness.  Neurological: He is alert and oriented to person, place, and time.  Skin: Skin is warm and dry.  Psychiatric: He has a normal mood and affect. His behavior is normal. Thought content normal.  Nursing note and vitals reviewed.  Assessment & Plan:  1: Chronic heart failure with reduced ejection fraction- - NYHA class II - euvolemic today - down another 4 pounds; admits to not weighing daily at home because of his vision and he was instructed to call for an overnight weight gain of >2 pounds or a weekly weight gain of >5 pounds when he does weigh - still using salt on some foods;  Instructed to not add any salt to food. Reminded him to follow a 2000mg  sodium diet.  - BMP from 05/15/17 reviewed which shows sodium 131, potassium 3.8 and GFR >60 - BNP from 11/22/16 was 44.0 - saw cardiologist Kirke Corin) 11/02/16 - PharmD reviewed medications with the patient  2: HTN- - BP initially elevated but improved upon recheck - has been out of spironolactone/potassium for the last month; says that his potassium level dropped during his most recent hospitalization. Prescriptions sent in with instructions to have labs rechecked in 2 weeks at his PCP's office - Endorses having bad HA's when blood pressure elevated  - saw PCP Laural Benes) on 05/09/17  3: Atrial fibrillation- - currently on apixaban, digoxin and max metoprolol XL - last dig level on 11/22/16 was <0.2; patient states he is taking once daily   4. Keratitis- - had recent hospital stay for right eye keratitis - is supposed to be on 3 different eye drops along with valtrex as he says the providers think the chicken pox virus got into his eye - he hasn't been able to start the medications due to cost and his daughter has been trying to find someplace that will provide them - explained the seriousness of how his eye looks and  my concern for complete and permanent vision loss if this isn't treated. Instructed him to go to the ER for treatment if he can't get in to see an opthalmologist. Patient says that he'll see about returning to [facility] ER where he was recently treated for this.   5: Tobacco- - currently smoking tobacco - complete cessation discussed for 3 minutes with him  Patient did not bring his medications nor a list. Each medication was verbally reviewed with the patient and he was encouraged to bring the bottles to every visit to confirm accuracy of list.  Return in 3 months or sooner for any questions/problems before then.

## 2017-07-06 DIAGNOSIS — H169 Unspecified keratitis: Secondary | ICD-10-CM | POA: Insufficient documentation

## 2017-07-14 ENCOUNTER — Observation Stay
Admission: EM | Admit: 2017-07-14 | Discharge: 2017-07-16 | Disposition: A | Discharge status: Home / Self Care | Payer: Medicare (Managed Care) | Attending: Family Medicine | Admitting: Family Medicine

## 2017-07-14 ENCOUNTER — Other Ambulatory Visit (INDEPENDENT_AMBULATORY_CARE_PROVIDER_SITE_OTHER): Payer: Self-pay | Admitting: Vascular Surgery

## 2017-07-14 ENCOUNTER — Observation Stay (HOSPITAL_BASED_OUTPATIENT_CLINIC_OR_DEPARTMENT_OTHER)
Admit: 2017-07-14 | Discharge: 2017-07-14 | Disposition: A | Discharge status: Home / Self Care | Payer: Medicare (Managed Care) | Attending: Physician Assistant | Admitting: Physician Assistant

## 2017-07-14 ENCOUNTER — Emergency Department: Payer: Medicare (Managed Care)

## 2017-07-14 ENCOUNTER — Other Ambulatory Visit: Payer: Self-pay

## 2017-07-14 ENCOUNTER — Encounter: Payer: Self-pay | Admitting: Internal Medicine

## 2017-07-14 DIAGNOSIS — R0789 Other chest pain: Principal | ICD-10-CM | POA: Insufficient documentation

## 2017-07-14 DIAGNOSIS — I272 Pulmonary hypertension, unspecified: Secondary | ICD-10-CM | POA: Insufficient documentation

## 2017-07-14 DIAGNOSIS — I361 Nonrheumatic tricuspid (valve) insufficiency: Secondary | ICD-10-CM | POA: Diagnosis not present

## 2017-07-14 DIAGNOSIS — E876 Hypokalemia: Secondary | ICD-10-CM | POA: Insufficient documentation

## 2017-07-14 DIAGNOSIS — J9811 Atelectasis: Secondary | ICD-10-CM | POA: Insufficient documentation

## 2017-07-14 DIAGNOSIS — B182 Chronic viral hepatitis C: Secondary | ICD-10-CM | POA: Insufficient documentation

## 2017-07-14 DIAGNOSIS — H109 Unspecified conjunctivitis: Secondary | ICD-10-CM | POA: Insufficient documentation

## 2017-07-14 DIAGNOSIS — N179 Acute kidney failure, unspecified: Secondary | ICD-10-CM | POA: Insufficient documentation

## 2017-07-14 DIAGNOSIS — E1165 Type 2 diabetes mellitus with hyperglycemia: Secondary | ICD-10-CM | POA: Insufficient documentation

## 2017-07-14 DIAGNOSIS — Z7901 Long term (current) use of anticoagulants: Secondary | ICD-10-CM | POA: Insufficient documentation

## 2017-07-14 DIAGNOSIS — F17200 Nicotine dependence, unspecified, uncomplicated: Secondary | ICD-10-CM | POA: Diagnosis not present

## 2017-07-14 DIAGNOSIS — I714 Abdominal aortic aneurysm, without rupture: Secondary | ICD-10-CM | POA: Insufficient documentation

## 2017-07-14 DIAGNOSIS — I110 Hypertensive heart disease with heart failure: Secondary | ICD-10-CM | POA: Insufficient documentation

## 2017-07-14 DIAGNOSIS — I700 Atherosclerosis of aorta: Secondary | ICD-10-CM | POA: Insufficient documentation

## 2017-07-14 DIAGNOSIS — J449 Chronic obstructive pulmonary disease, unspecified: Secondary | ICD-10-CM | POA: Insufficient documentation

## 2017-07-14 DIAGNOSIS — M1990 Unspecified osteoarthritis, unspecified site: Secondary | ICD-10-CM | POA: Insufficient documentation

## 2017-07-14 DIAGNOSIS — I5022 Chronic systolic (congestive) heart failure: Secondary | ICD-10-CM | POA: Diagnosis not present

## 2017-07-14 DIAGNOSIS — I959 Hypotension, unspecified: Secondary | ICD-10-CM | POA: Diagnosis not present

## 2017-07-14 DIAGNOSIS — F1010 Alcohol abuse, uncomplicated: Secondary | ICD-10-CM | POA: Diagnosis not present

## 2017-07-14 DIAGNOSIS — I081 Rheumatic disorders of both mitral and tricuspid valves: Secondary | ICD-10-CM | POA: Diagnosis not present

## 2017-07-14 DIAGNOSIS — I5023 Acute on chronic systolic (congestive) heart failure: Secondary | ICD-10-CM | POA: Insufficient documentation

## 2017-07-14 DIAGNOSIS — I255 Ischemic cardiomyopathy: Secondary | ICD-10-CM | POA: Insufficient documentation

## 2017-07-14 DIAGNOSIS — I10 Essential (primary) hypertension: Secondary | ICD-10-CM

## 2017-07-14 DIAGNOSIS — I712 Thoracic aortic aneurysm, without rupture: Secondary | ICD-10-CM | POA: Diagnosis not present

## 2017-07-14 DIAGNOSIS — I340 Nonrheumatic mitral (valve) insufficiency: Secondary | ICD-10-CM

## 2017-07-14 DIAGNOSIS — R079 Chest pain, unspecified: Secondary | ICD-10-CM | POA: Diagnosis present

## 2017-07-14 DIAGNOSIS — R0602 Shortness of breath: Secondary | ICD-10-CM

## 2017-07-14 DIAGNOSIS — Z8249 Family history of ischemic heart disease and other diseases of the circulatory system: Secondary | ICD-10-CM | POA: Insufficient documentation

## 2017-07-14 DIAGNOSIS — F17210 Nicotine dependence, cigarettes, uncomplicated: Secondary | ICD-10-CM | POA: Diagnosis not present

## 2017-07-14 DIAGNOSIS — E0590 Thyrotoxicosis, unspecified without thyrotoxic crisis or storm: Secondary | ICD-10-CM | POA: Diagnosis not present

## 2017-07-14 DIAGNOSIS — Z79899 Other long term (current) drug therapy: Secondary | ICD-10-CM | POA: Insufficient documentation

## 2017-07-14 DIAGNOSIS — I481 Persistent atrial fibrillation: Secondary | ICD-10-CM | POA: Insufficient documentation

## 2017-07-14 DIAGNOSIS — E871 Hypo-osmolality and hyponatremia: Secondary | ICD-10-CM | POA: Diagnosis not present

## 2017-07-14 DIAGNOSIS — Z9119 Patient's noncompliance with other medical treatment and regimen: Secondary | ICD-10-CM | POA: Insufficient documentation

## 2017-07-14 DIAGNOSIS — I482 Chronic atrial fibrillation: Secondary | ICD-10-CM | POA: Insufficient documentation

## 2017-07-14 LAB — BASIC METABOLIC PANEL
ANION GAP: 15 (ref 5–15)
BUN: 20 mg/dL (ref 6–20)
CALCIUM: 8.9 mg/dL (ref 8.9–10.3)
CHLORIDE: 82 mmol/L — AB (ref 101–111)
CO2: 25 mmol/L (ref 22–32)
CREATININE: 1.29 mg/dL — AB (ref 0.61–1.24)
GFR calc Af Amer: >60 mL/min (ref >60)
GFR calc non Af Amer: 56 mL/min — ABNORMAL LOW (ref >60)
Glucose, Bld: 141 mg/dL — ABNORMAL HIGH (ref 65–99)
Potassium: 3 mmol/L — ABNORMAL LOW (ref 3.5–5.1)
Sodium: 122 mmol/L — ABNORMAL LOW (ref 135–145)

## 2017-07-14 LAB — TROPONIN I
Troponin I: <0.03 ng/mL (ref <0.03)
Troponin I: <0.03 ng/mL (ref <0.03)
Troponin I: <0.03 ng/mL (ref <0.03)
Troponin I: <0.03 ng/mL (ref <0.03)

## 2017-07-14 LAB — ECHOCARDIOGRAM COMPLETE
HEIGHTINCHES: 72 in
Weight: 4160 oz

## 2017-07-14 LAB — CBC
HCT: 47.4 % (ref 40.0–52.0)
HEMOGLOBIN: 16.4 g/dL (ref 13.0–18.0)
MCH: 28.6 pg (ref 26.0–34.0)
MCHC: 34.6 g/dL (ref 32.0–36.0)
MCV: 82.6 fL (ref 80.0–100.0)
Platelets: 237 10*3/uL (ref 150–440)
RBC: 5.74 MIL/uL (ref 4.40–5.90)
RDW: 15.3 % — ABNORMAL HIGH (ref 11.5–14.5)
WBC: 6.5 10*3/uL (ref 3.8–10.6)

## 2017-07-14 LAB — TSH: TSH: 0.363 u[IU]/mL (ref 0.350–4.500)

## 2017-07-14 LAB — HEMOGLOBIN A1C
Hgb A1c MFr Bld: 6.7 % — ABNORMAL HIGH (ref 4.8–5.6)
Mean Plasma Glucose: 145.59 mg/dL

## 2017-07-14 LAB — DIGOXIN LEVEL: DIGOXIN LVL: 0.4 ng/mL — AB (ref 0.8–2.0)

## 2017-07-14 LAB — FIBRIN DERIVATIVES D-DIMER (ARMC ONLY): Fibrin derivatives D-dimer (ARMC): 1024.2 ng/mL (FEU) — ABNORMAL HIGH (ref 0.00–499.00)

## 2017-07-14 LAB — BRAIN NATRIURETIC PEPTIDE: B Natriuretic Peptide: 38 pg/mL (ref 0.0–100.0)

## 2017-07-14 LAB — MAGNESIUM: MAGNESIUM: 1.5 mg/dL — AB (ref 1.7–2.4)

## 2017-07-14 MED ORDER — DIGOXIN 125 MCG PO TABS
0.1250 mg | ORAL_TABLET | Freq: Every day | ORAL | Status: DC
  Administered 2017-07-14 – 2017-07-16 (×3): 0.125 mg via ORAL
  Filled 2017-07-14 (×3): qty 1

## 2017-07-14 MED ORDER — BOOST / RESOURCE BREEZE PO LIQD CUSTOM
1.0000 | Freq: Two times a day (BID) | ORAL | Status: DC
  Administered 2017-07-15 (×2): 1 via ORAL

## 2017-07-14 MED ORDER — PREDNISOLONE ACETATE 1 % OP SUSP
1.0000 [drp] | Freq: Four times a day (QID) | OPHTHALMIC | Status: DC
  Administered 2017-07-14 – 2017-07-16 (×9): 1 [drp] via OPHTHALMIC
  Filled 2017-07-14: qty 1

## 2017-07-14 MED ORDER — APIXABAN 5 MG PO TABS
5.0000 mg | ORAL_TABLET | Freq: Two times a day (BID) | ORAL | Status: DC
  Administered 2017-07-14 – 2017-07-16 (×4): 5 mg via ORAL
  Filled 2017-07-14 (×4): qty 1

## 2017-07-14 MED ORDER — SACUBITRIL-VALSARTAN 24-26 MG PO TABS: 1.0000 | ORAL_TABLET | Freq: Two times a day (BID) | ORAL | Status: DC

## 2017-07-14 MED ORDER — NITROGLYCERIN 2 % TD OINT
0.5000 [in_us] | TOPICAL_OINTMENT | TRANSDERMAL | Status: AC
  Filled 2017-07-14: qty 1

## 2017-07-14 MED ORDER — ACETAMINOPHEN 325 MG PO TABS
650.0000 mg | ORAL_TABLET | Freq: Four times a day (QID) | ORAL | Status: DC | PRN
  Administered 2017-07-14 – 2017-07-16 (×6): 650 mg via ORAL
  Filled 2017-07-14 (×7): qty 2

## 2017-07-14 MED ORDER — LORAZEPAM 2 MG/ML IJ SOLN
1.0000 mg | Freq: Once | INTRAMUSCULAR | Status: AC
  Administered 2017-07-14: 1 mg via INTRAVENOUS

## 2017-07-14 MED ORDER — DOCUSATE SODIUM 100 MG PO CAPS
100.0000 mg | ORAL_CAPSULE | Freq: Two times a day (BID) | ORAL | Status: DC
  Administered 2017-07-14 – 2017-07-16 (×5): 100 mg via ORAL
  Filled 2017-07-14 (×5): qty 1

## 2017-07-14 MED ORDER — FUROSEMIDE 40 MG PO TABS: 40.0000 mg | ORAL_TABLET | Freq: Every day | ORAL | Status: DC | PRN

## 2017-07-14 MED ORDER — LORAZEPAM 2 MG/ML IJ SOLN
INTRAMUSCULAR | Status: AC
  Administered 2017-07-14: 1 mg via INTRAVENOUS
  Filled 2017-07-14: qty 1

## 2017-07-14 MED ORDER — ACETAMINOPHEN 650 MG RE SUPP: 650.0000 mg | Freq: Four times a day (QID) | RECTAL | Status: DC | PRN

## 2017-07-14 MED ORDER — SODIUM CHLORIDE 0.9 % IV SOLN
INTRAVENOUS | Status: DC
  Administered 2017-07-14: 10:00:00 via INTRAVENOUS

## 2017-07-14 MED ORDER — METOPROLOL SUCCINATE ER 100 MG PO TB24: 200.0000 mg | ORAL_TABLET | Freq: Every day | ORAL | Status: DC

## 2017-07-14 MED ORDER — NAPHAZOLINE-PHENIRAMINE 0.025-0.3 % OP SOLN: 1.0000 [drp] | Freq: Four times a day (QID) | OPHTHALMIC | Status: DC | PRN

## 2017-07-14 MED ORDER — MAGNESIUM OXIDE 400 (241.3 MG) MG PO TABS
400.0000 mg | ORAL_TABLET | Freq: Two times a day (BID) | ORAL | Status: AC
  Administered 2017-07-14 (×2): 400 mg via ORAL
  Filled 2017-07-14 (×2): qty 1

## 2017-07-14 MED ORDER — ONDANSETRON HCL 4 MG PO TABS
4.0000 mg | ORAL_TABLET | Freq: Four times a day (QID) | ORAL | Status: DC | PRN
Start: 2017-07-14 — End: 2017-07-16

## 2017-07-14 MED ORDER — IOPAMIDOL (ISOVUE-370) INJECTION 76%
75.0000 mL | Freq: Once | INTRAVENOUS | Status: AC | PRN
  Administered 2017-07-14: 75 mL via INTRAVENOUS

## 2017-07-14 MED ORDER — POTASSIUM CHLORIDE CRYS ER 20 MEQ PO TBCR
20.0000 meq | EXTENDED_RELEASE_TABLET | Freq: Every day | ORAL | Status: DC
Start: 2017-07-14 — End: 2017-07-16
  Administered 2017-07-14 – 2017-07-16 (×3): 20 meq via ORAL
  Filled 2017-07-14 (×3): qty 1

## 2017-07-14 MED ORDER — METHIMAZOLE 10 MG PO TABS
20.0000 mg | ORAL_TABLET | Freq: Two times a day (BID) | ORAL | Status: DC
  Administered 2017-07-14 – 2017-07-16 (×5): 20 mg via ORAL
  Filled 2017-07-14 (×6): qty 2

## 2017-07-14 MED ORDER — POTASSIUM CHLORIDE CRYS ER 20 MEQ PO TBCR
40.0000 meq | EXTENDED_RELEASE_TABLET | Freq: Two times a day (BID) | ORAL | Status: AC
  Administered 2017-07-14 (×2): 40 meq via ORAL
  Filled 2017-07-14 (×2): qty 2

## 2017-07-14 MED ORDER — ONDANSETRON HCL 4 MG/2ML IJ SOLN: 4.0000 mg | Freq: Four times a day (QID) | INTRAMUSCULAR | Status: DC | PRN

## 2017-07-14 MED ORDER — SPIRONOLACTONE 25 MG PO TABS
12.5000 mg | ORAL_TABLET | Freq: Every day | ORAL | Status: DC
  Filled 2017-07-14: qty 0.5

## 2017-07-14 MED ORDER — MOMETASONE FURO-FORMOTEROL FUM 200-5 MCG/ACT IN AERO
2.0000 | INHALATION_SPRAY | Freq: Two times a day (BID) | RESPIRATORY_TRACT | Status: DC
  Administered 2017-07-14 – 2017-07-16 (×5): 2 via RESPIRATORY_TRACT
  Filled 2017-07-14: qty 8.8

## 2017-07-14 MED ORDER — IPRATROPIUM-ALBUTEROL 0.5-2.5 (3) MG/3ML IN SOLN
6.0000 mL | Freq: Once | RESPIRATORY_TRACT | Status: AC
  Administered 2017-07-14: 6 mL via RESPIRATORY_TRACT

## 2017-07-14 MED ORDER — IPRATROPIUM-ALBUTEROL 0.5-2.5 (3) MG/3ML IN SOLN
RESPIRATORY_TRACT | Status: AC
  Filled 2017-07-14: qty 6

## 2017-07-14 MED ORDER — METOPROLOL SUCCINATE ER 100 MG PO TB24
100.0000 mg | ORAL_TABLET | Freq: Every day | ORAL | Status: DC
  Filled 2017-07-14: qty 1

## 2017-07-14 NOTE — Progress Notes (Signed)
Attempted to place BIPAP on Patient however Pt. Unable to tolerate. Dr. Manson Passey notified. BIPAP placed on standby.

## 2017-07-14 NOTE — ED Notes (Signed)
Care accepted from [facility]. RN. Pt is resting at this time awaiting bed transfer.

## 2017-07-14 NOTE — Care Management Obs Status (Signed)
MEDICARE OBSERVATION STATUS NOTIFICATION   Patient Details  Name: Eugene Henderson MRN: [id number] Date of Birth: [birth year]   Medicare Observation Status Notification Given:  Yes  CM assisted patient with signing name with computer mouse  Eber Hong, RN 07/14/2017, 4:28 PM

## 2017-07-14 NOTE — Consult Note (Signed)
[facility] VEIN SPECIALISTS Vascular Consult Note  MRN : [id number]  Eugene Henderson is a 67 y.o. (1950/06/25) male who presents with chief complaint of  Chief Complaint  Patient presents with  . Chest Pain   History of Present Illness:  The patient is a 67 year old male well-known to our office last seen on February 24, 2017 for evaluation of a 4.5 cm AAA.  The patient was recommended to follow-up in our office in 6 months. During a workup for chest pain in the [facility] ED the patient was found to have an aneurysmal dilatation of the ascending thoracic aorta to 5.0 cm in AP dimension. The aortic arch measures 4.5 cm in diameter, while the descending thoracic aorta measures 4.3 cm in AP dimension.   The patient endorses a history of worsening shortness of breath and chest pain over the last week which prompted his visit to the emergency department.  The patient's symptoms have now resided.  The patient does have a past medical history of known chronic systolic heart failure due to ischemic cardiomyopathy, chronic atrial fibrillation for which he is on Eliquis, pulmonary hypertension, tobacco abuse, alcohol abuse, hypertension, chronic hepatitis C, COPD, diverticulitis.  At this time, the patient denies any shortness of breath or chest pain with or without activity.  The patient notes that he has not had any alcohol in approximately 3 weeks.  The patient continues to smoke however is interested in trying nicotine patches to help him quit.  The patient denies any abdominal pain or back pain.  The patient denies any fever, nausea vomiting.  Vascular surgery was consulted by Dr. Sheryle Hail for evaluation of the patient's thoracic aneurysm.  Current Facility-Administered Medications  Medication Dose Route Frequency Provider Last Rate Last Dose  . acetaminophen (TYLENOL) tablet 650 mg  650 mg Oral Q6H PRN Arnaldo Natal, MD   650 mg at 07/14/17 1127   Or  .  acetaminophen (TYLENOL) suppository 650 mg  650 mg Rectal Q6H PRN Arnaldo Natal, MD      . apixaban Everlene Balls) tablet 5 mg  5 mg Oral BID Arnaldo Natal, MD      . digoxin Margit Banda) tablet 0.125 mg  0.125 mg Oral Daily Arnaldo Natal, MD   0.125 mg at 07/14/17 1127  . docusate sodium (COLACE) capsule 100 mg  100 mg Oral BID Arnaldo Natal, MD   100 mg at 07/14/17 1027  . feeding supplement (BOOST / RESOURCE BREEZE) liquid 1 Container  1 Container Oral BID BM Arnaldo Natal, MD      . furosemide (LASIX) tablet 40 mg  40 mg Oral Daily PRN Arnaldo Natal, MD      . magnesium oxide (MAG-OX) tablet 400 mg  400 mg Oral BID Katha Hamming, MD   400 mg at 07/14/17 1347  . methimazole (TAPAZOLE) tablet 20 mg  20 mg Oral BID Arnaldo Natal, MD   20 mg at 07/14/17 1127  . metoprolol succinate (TOPROL-XL) 24 hr tablet 100 mg  100 mg Oral Daily Arida, Muhammad A, MD      . mometasone-formoterol (DULERA) 200-5 MCG/ACT inhaler 2 puff  2 puff Inhalation BID Arnaldo Natal, MD   2 puff at 07/14/17 1128  . naphazoline-pheniramine (NAPHCON-A) 0.025-0.3 % ophthalmic solution 1 drop  1 drop Both Eyes QID PRN Arnaldo Natal, MD      . nitroGLYCERIN (NITROGLYN) 2 % ointment 0.5 inch  0.5 inch Topical STAT Sheryle Hail,  Kelton Pillar, MD      . ondansetron [facility]) tablet 4 mg  4 mg Oral Q6H PRN Arnaldo Natal, MD       Or  . ondansetron [facility]) injection 4 mg  4 mg Intravenous Q6H PRN Arnaldo Natal, MD      . potassium chloride SA (K-DUR,KLOR-CON) CR tablet 20 mEq  20 mEq Oral Daily Arnaldo Natal, MD   20 mEq at 07/14/17 1025  . potassium chloride SA (K-DUR,KLOR-CON) CR tablet 40 mEq  40 mEq Oral BID Eula Listen M, PA-C   40 mEq at 07/14/17 1026  . prednisoLONE acetate (PRED FORTE) 1 % ophthalmic suspension 1 drop  1 drop Right Eye QID Arnaldo Natal, MD   1 drop at 07/14/17 1347   Past Medical History:  Diagnosis Date  . Arthritis   . Asthma   . CHF (congestive  heart failure) (HCC)    a. EF 25-30% by echo in 07/2016 with cath showing no significant CAD b. 01/2017: EF 30-35% with diffuse HK and moderate MR  . Dyspnea   . Hypertension   . Hyperthyroidism   . Noncompliance   . Persistent atrial fibrillation [facility])    Past Surgical History:  Procedure Laterality Date  . JOINT REPLACEMENT Right   . RIGHT/LEFT HEART CATH AND CORONARY ANGIOGRAPHY N/A 08/02/2016   Procedure: Right/Left Heart Cath and Coronary Angiography;  Surgeon: Iran Ouch, MD;  Location: [facility] INVASIVE CV LAB;  Service: Cardiovascular;  Laterality: N/A;  . TESTICLE SURGERY     Patient states that he had to have the tube fixed.   Social History Social History   Tobacco Use  . Smoking status: Current Some Day Smoker    Packs/day: 0.25    Years: 25.00    Pack years: 6.25    Types: Cigarettes  . Smokeless tobacco: Never Used  Substance Use Topics  . Alcohol use: Yes    Comment: Socially - once a month  . Drug use: No   Family History Family History  Problem Relation Age of Onset  . Diabetes Mother   . Diabetes Father   . Heart disease Brother   . Heart attack Brother   . Diabetes Brother   . Kidney failure Brother   The patient denies a family history of peripheral artery disease, aneurysmal disease or porphyria.  No Known Allergies  REVIEW OF SYSTEMS (Negative unless checked)  Constitutional: [] Weight loss  [] Fever  [] Chills Cardiac: [x] Chest pain   [x] Chest pressure   [] Palpitations   [] Shortness of breath when laying flat   [] Shortness of breath at rest   [x] Shortness of breath with exertion. Vascular:  [] Pain in legs with walking   [] Pain in legs at rest   [] Pain in legs when laying flat   [] Claudication   [] Pain in feet when walking  [] Pain in feet at rest  [] Pain in feet when laying flat   [] History of DVT   [] Phlebitis   [] Swelling in legs   [] Varicose veins   [] Non-healing ulcers Pulmonary:   [] Uses home oxygen   [] Productive cough   [] Hemoptysis    [] Wheeze  [x] COPD   [] Asthma Neurologic:  [] Dizziness  [] Blackouts   [] Seizures   [] History of stroke   [] History of TIA  [] Aphasia   [] Temporary blindness   [] Dysphagia   [] Weakness or numbness in arms   [] Weakness or numbness in legs Musculoskeletal:  [] Arthritis   [] Joint swelling   [] Joint pain   [] Low back pain  Hematologic:  [] Easy bruising  [] Easy bleeding   [] Hypercoagulable state   [] Anemic  [] Hepatitis Gastrointestinal:  [] Blood in stool   [] Vomiting blood  [] Gastroesophageal reflux/heartburn   [] Difficulty swallowing. Genitourinary:  [] Chronic kidney disease   [] Difficult urination  [] Frequent urination  [] Burning with urination   [] Blood in urine Skin:  [] Rashes   [] Ulcers   [] Wounds Psychological:  [] History of anxiety   []  History of major depression.  Physical Examination  Vitals:   07/14/17 0600 07/14/17 0700 07/14/17 0910 07/14/17 1024  BP: 100/80 (!) 128/111 110/67 105/70  Pulse:   (!) 140 (!) 102  Resp: 19     Temp:   97.7 F (36.5 C)   TempSrc:   Oral   SpO2:   100% 99%  Weight:      Height:       Body mass index is 35.26 kg/m. Gen:  WD/WN, NAD Head: [place]/AT, No temporalis wasting. Prominent temp pulse not noted. Ear/Nose/Throat: Hearing grossly intact, nares w/o erythema or drainage, oropharynx w/o Erythema/Exudate Eyes: Sclera non-icteric, conjunctiva clear Neck: Trachea midline.  No JVD.  Pulmonary:  Good air movement, respirations not labored, equal bilaterally.  Cardiac: RRR, normal S1, S2. Vascular:  Vessel Right Left  Radial Palpable Palpable  Ulnar Palpable Palpable  Brachial Palpable Palpable  Carotid Palpable, without bruit Palpable, without bruit  Aorta Not palpable N/A  Femoral Palpable Palpable  Popliteal Palpable Palpable  PT Palpable Palpable  DP Palpable Palpable   Gastrointestinal: soft, non-tender/non-distended. No guarding/reflex.  Musculoskeletal: M/S 5/5 throughout.  Extremities without ischemic changes.  No deformity or atrophy. No  edema. Neurologic: Sensation grossly intact in extremities.  Symmetrical.  Speech is fluent. Motor exam as listed above. Psychiatric: Judgment intact, Mood & affect appropriate for pt's clinical situation. Dermatologic: No rashes or ulcers noted.  No cellulitis or open wounds. Lymph : No Cervical, Axillary, or Inguinal lymphadenopathy.  CBC Lab Results  Component Value Date   WBC 6.5 07/14/2017   HGB 16.4 07/14/2017   HCT 47.4 07/14/2017   MCV 82.6 07/14/2017   PLT 237 07/14/2017   BMET    Component Value Date/Time   NA 122 (L) 07/14/2017 0158   NA 139 05/09/2017 1143   K 3.0 (L) 07/14/2017 0158   CL 82 (L) 07/14/2017 0158   CO2 25 07/14/2017 0158   GLUCOSE 141 (H) 07/14/2017 0158   BUN 20 07/14/2017 0158   BUN 10 05/09/2017 1143   CREATININE 1.29 (H) 07/14/2017 0158   CALCIUM 8.9 07/14/2017 0158   GFRNONAA 56 (L) 07/14/2017 0158   GFRAA >60 07/14/2017 0158   Estimated Creatinine Clearance: 74.7 mL/min (A) (by C-G formula based on SCr of 1.29 mg/dL (H)).  COAG Lab Results  Component Value Date   INR 1.07 07/28/2016   Radiology Ct Angio Chest Pe W And/or Wo Contrast  Result Date: 07/14/2017 CLINICAL DATA:  Acute onset of midsternal chest pain. EXAM: CT ANGIOGRAPHY CHEST WITH CONTRAST TECHNIQUE: Multidetector CT imaging of the chest was performed using the standard protocol during bolus administration of intravenous contrast. Multiplanar CT image reconstructions and MIPs were obtained to evaluate the vascular anatomy. CONTRAST:  75mL ISOVUE-370 IOPAMIDOL (ISOVUE-370) INJECTION 76% COMPARISON:  CT of the chest performed 05/13/2017 FINDINGS: Cardiovascular:  There is no evidence of pulmonary embolus. There is aneurysmal dilatation of the ascending thoracic aorta to 5.0 cm in AP dimension. The aortic arch measures 4.5 cm in diameter, while the descending thoracic aorta measures 4.3 cm in AP dimension. The great vessels  are grossly unremarkable in appearance. The heart is borderline  enlarged, with mild biatrial enlargement. Mediastinum/Nodes: The mediastinum is otherwise unremarkable in appearance. No mediastinal lymphadenopathy is seen. No pericardial effusion is identified. The thyroid gland is grossly unremarkable. No axillary lymphadenopathy is seen. Lungs/Pleura: Minimal right-sided atelectasis is noted. No pleural effusion or pneumothorax is seen. No masses are identified. Upper Abdomen: The visualized portions of the liver and spleen are unremarkable. The visualized portions of the pancreas, adrenal glands and kidneys are within normal limits. Scattered calcification is seen along the proximal abdominal aorta. Musculoskeletal: No acute osseous abnormalities are identified. The visualized musculature is unremarkable in appearance. Review of the MIP images confirms the above findings. IMPRESSION: 1. No evidence of pulmonary embolus. 2. 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 Recommend semi-annual imaging followup by CTA or MRA and referral to cardiothoracic surgery if not already obtained. This recommendation follows 2010 ACCF/AHA/AATS/ACR/ASA/SCA/SCAI/SIR/STS/SVM Guidelines for the Diagnosis and Management of Patients With Thoracic Aortic Disease. Circulation. 2010; 121: Z610-R604 3. Borderline cardiomegaly, with mild biatrial enlargement. 4. Minimal right-sided atelectasis.  Lungs otherwise clear. Aortic Atherosclerosis (ICD10-I70.0). Electronically Signed   By: Roanna Raider M.D.   On: 07/14/2017 04:27   Dg Chest Port 1 View  Result Date: 07/14/2017 CLINICAL DATA:  Chest pain EXAM: PORTABLE CHEST 1 VIEW COMPARISON:  05/13/2017 FINDINGS: Mild cardiomegaly.  No consolidation or effusion.  No pneumothorax. IMPRESSION: No active disease.  Mild cardiomegaly. Electronically Signed   By: Jasmine Pang M.D.   On: 07/14/2017 02:14   Assessment/Plan The patient is a 67 year old male with multiple medical problems well-known to our clinic who has  been evaluated for a AAA measuring 4.5 cm in the past - stable 1. Ascending thoracic aorta:  (5.0cm on 07/14/17) -we will be happy to follow the patient's a sending thoracic aortic aneurysm with a CTA of the chest every 6 months as a aneurysm measures 5.0 cm.  If the patient's aneurysm should increase in size to 6 cm we would have to refer him to [facility] or [facility] as he would need consultation with a cardiac surgeon.  I explained the pathophysiology of an aneurysm to the patient.  We discussed the absolute need for tobacco cessation and hypertension control. 2.  Abdominal aortic aneurysm: (4.5 cm on May 13, 2017) we will continue to monitor the patient every 6 months with an abdominal aortic duplex.  The patient understands that if his aneurysm should go to the size of 5 cm he will need to undergo repair.  I explained the pathophysiology of an aneurysm to the patient.  We discussed the absolute need for tobacco cessation and hypertension control. 3.  Tobacco abuse: We had a discussion for approximately 10 minutes regarding the absolute need for smoking cessation due to the deleterious nature of tobacco on the vascular system. We discussed the tobacco use would diminish patency of any intervention, and likely significantly worsen progressio of disease. We discussed multiple agents for quitting including replacement therapy or medications to reduce cravings such as Chantix. The patient voices their understanding of the importance of smoking cessation. 4.  Hypertension: Encouraged good control as its slows the progression of atherosclerotic and aneurysmal disease  Discussed with Dr. Weldon Inches, PA-C  07/14/2017 2:14 PM  This note was created with Dragon medical transcription system.  Any error is purely unintentional.

## 2017-07-14 NOTE — ED Notes (Signed)
Patient is resting comfortably at this time with no signs of distress present. VS stable. Will continue to monitor.   

## 2017-07-14 NOTE — Care Management (Signed)
Patient has traditional medicare card.  Snet information to [employer] with pre service center.  Copies of medicare and medicaid cards placed in medical record

## 2017-07-14 NOTE — Progress Notes (Signed)
Admitted for chest pain and shortness of breath. Lab data reviewed Medications reviewed Physical examination: S1, S2 regular,  lungs clear to auscultation Extremities: Mild pedal edema present. Assessment and plan: Acute on chronic systolic heart failure with hyponatremia: Patient told me that he gained some weight and has been having shortness of breath.  Start IV Lasix.  Discontinue IV fluids.  Needed we can use tolvaptan. 2. Left side chestPain both atypical and typical.  Check echo, continue Eliquis, already on digoxin, unable to do beta-blocker because of hypotension. 3. hypokalemia: Patient told me that he ran out of potassium tablets 3 weeks ago.  Replace potassium, magnesium. Marland Kitchen  Acute kidney injury: Received IV fluids. 4.  Nonischemic cardiomyopathy: Echo pending, hold Entresto at this time, Toprol-XL, Aldactone because of soft blood pressure.  time Spent 20 minutes.

## 2017-07-14 NOTE — Progress Notes (Signed)
Patient with hx of CHF, HTN, a-fib, hypothyroidism, asthma,  who presented to the ER with c/o chest pain.  Patient last echo in 01/2017 revealed an EF of 30-35% with diffuse HK and moderate MR.    Active Problem List:   Admitted for chest pain and shortness of breath.  1. Patient found to be in acute on chronic systolic heart failure with hyponatremia.  Patient is reporting weight gain with eating all kinds of food during the super bowl of chili bean, barbeque, and nachos.  Patient also reported that he had not been weighing himself on a regular basis.   Patient started on IV Lasix.   2. Left side chest. Pain both atypical and typical.  Check echo, continue Eliquis, already on digoxin, patient is unable to do beta-blocker because of hypotension. 3. Hypokalemia: Patient reported to MD he ran out of potassium tablets 3 weeks ago.  Potassium and magnesium being replaced.   4. Acute kidney injury: Received IV fluids. 5.  Nonischemic cardiomyopathy: Echo pending, Per MD - hold Entresto at this time, Toprol-XL, Aldactone because of soft blood pressure.  CHF Education:   Educational session with patient completed.  Provided patient with "Living Better with Heart Failure" packet. Briefly reviewed definition of heart failure and signs and symptoms of an exacerbation.  Discussed the meaning of EF and his preliminary value as compared to normal value.?Echo ordered and pending.  Explained to patient that HF is a chronic illness which requires self-assessment / self-management along with help from the cardiologist/PCP/HF Clinic.? ?? *Reviewed importance of and reason behind checking weight daily in the AM, after using the bathroom, but before getting dressed.?Patient has scales.??Patient informed this RN that he has gotten out of the habit of weighing himself and has been distracted with this ongoing eye infection that he is being treated for.  Patient reports he is being treated for shingles of the eye or possibly  syphilis.    Encouraged patient to resume?weighing himself again and explained the importance of doing so along with assessing his symptoms.?? ? Reviewed the following information with patient:  *Discussed when to call the Dr= weight gain of >2-3lb overnight of 5lb in a week,  *Discussed yellow zone= call MD: weight gain of >2-3lb overnight of 5lb in a week, increased swelling, increased SOB when lying down, chest discomfort, dizziness, increased fatigue *Red Zone= call 911: struggle to breath, fainting or near fainting, significant chest pain   *Reviewed low sodium diet-provided handout of recommended and not recommended foods.  Reviewed reading labels with patient. Discussed fluid intake with patient as well. Patient not currently on a fluid restriction, but advised no more than 8-8 ounces glass of fluids per day.?As reported above, patient admitted to eating to much salt over the past few weeks due to the super bowl.  Note:  Dietitian Consultation entered.   ? *Instructed patient to take medications as prescribed for heart failure. Explained briefly why pt is on the medications (either make you feel better, live longer or keep you out of the hospital) and discussed monitoring and side effects.   *Smoking Cessation?- Patient is a current smoker.?Patient informed this RN that he has decreased the number of cigarettes he smokes on a daily/weekly basis.  Patient reports having smoked 4 cigarettes last week and only two this week.  He also reported he has a bag of tobacco that he is trying to sell because he does not want to throw it away and he knows he does not  need to smoke it himself.  Patient also admitted that he needs to stop bumming cigarettes from family members.  Praise given for decreasing the number of cigarettes.  "Thinking about Quitting - Yes You Can!" informational sheet on tips on quitting provided and reviewed with patient.   ? *Discussed the benefits of exercise.  Patient encouraged to  remain as active as possible.?  *[facility]- Patient is followed in the [facility].  Appointment made for patient on 07/21/2017 at 1 p.m.  Patient informed of this appointment. Patient is encouraged to keep this appointment.     Again, this RN reviewed the 5 Steps to Living Better with Heart Failure.  Patient thanked me for providing the above information.  ? Army Melia, RN, BSN, [facility] Cardiovascular and Pulmonary Nurse Navigator

## 2017-07-14 NOTE — ED Triage Notes (Signed)
Pt BIB EMS with compaints of 7/10 midsternal chest pain that began around 2315 last night. Pt reports pain initially beginning in leg and shooting up to his chest. ED MD at bedside.

## 2017-07-14 NOTE — ED Provider Notes (Signed)
[facility] Emergency Department Provider Note _   First MD Initiated Contact with Patient 07/14/17 0145     (approximate)  I have reviewed the triage vital signs and the nursing notes.   HISTORY  Chief Complaint Chest Pain   HPI Eugene Henderson is a 67 y.o. male with below list of chronic medical conditions including CHF hypertension and atrial fibrillation presents to the emergency department via EMS 7 out of 10 midsternal chest pain that is nonradiating which began at 11:15 PM last night.  Patient admits to dyspnea and diaphoresis as well.  Patient denies any nausea or vomiting.   Past Medical History:  Diagnosis Date  . Arthritis   . Asthma   . CHF (congestive heart failure) (HCC)    a. EF 25-30% by echo in 07/2016 with cath showing no significant CAD b. 01/2017: EF 30-35% with diffuse HK and moderate MR  . Hypertension   . Hyperthyroidism   . Noncompliance   . Persistent atrial fibrillation [facility])     Patient Active Problem List   Diagnosis Date Noted  . Chest pain 07/14/2017  . 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 Diverticulitis   . Diverticulitis of large intestine with perforation without abscess or bleeding 05/13/2017  . COPD (chronic obstructive pulmonary disease) (HCC) 02/03/2017  . 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) 12/15/2016  . AAA (abdominal aortic aneurysm) (HCC) 11/29/2016  . Chronic low back pain 11/29/2016  . Alcohol abuse 11/29/2016  . Hyponatremia 11/22/2016  . Chronic systolic heart failure (HCC) 08/19/2016  . Tobacco use 08/19/2016  . Snoring 08/19/2016  . Persistent atrial fibrillation (HCC) 07/28/2016  . Hyperthyroidism 07/28/2016  . Noncompliance with medications 07/28/2016  . Essential hypertension 07/28/2016    Past Surgical History:  Procedure Laterality Date  . JOINT REPLACEMENT Right   . RIGHT/LEFT HEART CATH AND CORONARY ANGIOGRAPHY N/A 08/02/2016   Procedure: Right/Left Heart Cath and  Coronary Angiography;  Surgeon: Iran Ouch, MD;  Location: [facility] INVASIVE CV LAB;  Service: Cardiovascular;  Laterality: N/A;  . TESTICLE SURGERY     Patient states that he had to have the tube fixed.    Prior to Admission medications   Medication Sig Start Date End Date Taking? Authorizing Provider  acetaminophen (TYLENOL) 500 MG tablet Take 500 mg by mouth every 6 (six) hours as needed.   Yes [provider]  albuterol (PROVENTIL HFA;VENTOLIN HFA) 108 (90 Base) MCG/ACT inhaler Inhale 2 puffs into the lungs every 6 (six) hours as needed for wheezing or shortness of breath. 07/05/17  Yes Clarisa Kindred A, FNP  apixaban (ELIQUIS) 5 MG TABS tablet Take 1 tablet (5 mg total) by mouth 2 (two) times daily. 05/09/17  Yes Mira, Megan P, DO  budesonide-formoterol (SYMBICORT) 160-4.5 MCG/ACT inhaler Inhale 2 puffs into the lungs 2 (two) times daily. 07/05/17  Yes Hackney, Inetta Fermo A, FNP  digoxin (LANOXIN) 0.125 MG tablet Take 1 tablet (0.125 mg total) by mouth daily. 05/09/17  Yes Virginia, Megan P, DO  furosemide (LASIX) 40 MG tablet Take 1 tablet (40 mg total) by mouth daily as needed. For SOB/LE edema 05/09/17  Yes Lawley, Megan P, DO  methimazole (TAPAZOLE) 10 MG tablet Take 2 tablets (20 mg total) by mouth 2 (two) times daily. 05/09/17  Yes Ewing, Megan P, DO  metoprolol (TOPROL-XL) 200 MG 24 hr tablet Take 1 tablet (200 mg total) by mouth daily. Take with or immediately following a meal. 05/09/17  Yes Morrish, [employer],  DO  naphazoline-pheniramine (NAPHCON-A) 0.025-0.3 % ophthalmic solution Place 1 drop into both eyes 4 (four) times daily as needed for eye irritation. 05/06/17  Yes Joni Reining, PA-C  potassium chloride SA (K-DUR,KLOR-CON) 20 MEQ tablet Take 1 tablet (20 mEq total) by mouth daily. 07/05/17  Yes Hackney, Tina A, FNP  sacubitril-valsartan (ENTRESTO) 24-26 MG Take 1 tablet by mouth 2 (two) times daily.   Yes [provider]  spironolactone (ALDACTONE) 25 MG tablet Take  0.5 tablets (12.5 mg total) by mouth daily. 07/05/17  Yes Hackney, Inetta Fermo A, FNP  cyclobenzaprine (FLEXERIL) 10 MG tablet Take 1 tablet (10 mg total) by mouth at bedtime. Patient not taking: Reported on 07/14/2017 05/18/17   Olevia Perches P, DO  feeding supplement (BOOST / RESOURCE BREEZE) LIQD Take 1 Container by mouth 2 (two) times daily between meals. 11/26/16   Delfino Lovett, MD  gentamicin (GARAMYCIN) 0.3 % ophthalmic solution Place 1 drop into both eyes every 4 (four) hours. Patient not taking: Reported on 07/05/2017 05/06/17   Joni Reining, PA-C  metroNIDAZOLE (FLAGYL) 500 MG tablet Take 1 tablet (500 mg total) by mouth 3 (three) times daily. Patient not taking: Reported on 07/05/2017 05/16/17   Lattie Haw, MD  moxifloxacin (VIGAMOX) 0.5 % ophthalmic solution Place 1 drop into the right eye as directed.    [provider]  prednisoLONE acetate (PRED FORTE) 1 % ophthalmic suspension Place 1 drop into the right eye 4 (four) times daily.    [provider]  tobramycin (TOBREX) 0.3 % ophthalmic ointment Place into both eyes every 4 (four) hours while awake. Patient not taking: Reported on 07/05/2017 05/16/17   Lattie Haw, MD    Allergies No known drug allergies  Family History  Problem Relation Age of Onset  . Diabetes Mother   . Diabetes Father   . Heart disease Brother   . Heart attack Brother   . Diabetes Brother   . Kidney failure Brother     Social History Social History   Tobacco Use  . Smoking status: Current Some Day Smoker    Packs/day: 0.25    Years: 25.00    Pack years: 6.25    Types: Cigarettes  . Smokeless tobacco: Never Used  Substance Use Topics  . Alcohol use: Yes    Comment: Socially - once a month  . Drug use: No    Review of Systems Constitutional: No fever/chills Eyes: No visual changes. ENT: No sore throat. Cardiovascular: Positive for chest pain. Respiratory: Positive for shortness of breath. Gastrointestinal: No  abdominal pain.  No nausea, no vomiting.  No diarrhea.  No constipation. Genitourinary: Negative for dysuria. Musculoskeletal: Negative for neck pain.  Negative for back pain. Integumentary: Negative for rash. Neurological: Negative for headaches, focal weakness or numbness.   ____________________________________________   PHYSICAL EXAM:  VITAL SIGNS: ED Triage Vitals  Enc Vitals Group     BP 07/14/17 0152 (!) 88/59     Pulse Rate 07/14/17 0200 90     Resp 07/14/17 0146 16     Temp 07/14/17 0146 97.6 F (36.4 C)     Temp Source 07/14/17 0146 Oral     SpO2 07/14/17 0146 100 %     Weight 07/14/17 0209 117.9 kg (260 lb)     Height 07/14/17 0209 1.829 m (6')     Head Circumference --      Peak Flow --      Pain Score --  Pain Loc --      Pain Edu? --      Excl. in GC? --     Constitutional: Alert and oriented. Well appearing and in no acute distress. Eyes: Conjunctivae are normal.  Head: Atraumatic. Mouth/Throat: Mucous membranes are moist.  Oropharynx non-erythematous. Neck: No stridor.   Cardiovascular: Normal rate, regular rhythm. Good peripheral circulation. Grossly normal heart sounds. Respiratory: Normal respiratory effort.  No retractions. Lungs CTAB. Gastrointestinal: Soft and nontender. No distention.  Musculoskeletal: No lower extremity tenderness nor edema. No gross deformities of extremities. Neurologic:  Normal speech and language. No gross focal neurologic deficits are appreciated.  Skin:  Skin is warm, dry and intact. No rash noted. Psychiatric: Mood and affect are normal. Speech and behavior are normal.  ____________________________________________   LABS (all labs ordered are listed, but only abnormal results are displayed)  Labs Reviewed  BASIC METABOLIC PANEL - Abnormal; Notable for the following components:      Result Value   Sodium 122 (*)    Potassium 3.0 (*)    Chloride 82 (*)    Glucose, Bld 141 (*)    Creatinine, Ser 1.29 (*)    GFR  calc non Af Amer 56 (*)    All other components within normal limits  CBC - Abnormal; Notable for the following components:   RDW 15.3 (*)    All other components within normal limits  FIBRIN DERIVATIVES D-DIMER ([facility] ONLY) - Abnormal; Notable for the following components:   Fibrin derivatives D-dimer ([facility]) 1,024.20 (*)    All other components within normal limits  TROPONIN I  TROPONIN I   ____________________________________________  EKG  ED ECG REPORT I, [place] N BROWN, the attending physician, personally viewed and interpreted this ECG.   Date: 07/14/2017  EKG Time: 5:56 AM  Rate: 96  Rhythm: Atrial fibrillation  Axis: Normal  Intervals: Normal  ST&T Change: None  ____________________________________________  RADIOLOGY I, [place] N BROWN, personally viewed and evaluated these images (plain radiographs) as part of my medical decision making, as well as reviewing the written report by the radiologist.  ED MD interpretation: No acute findings noted on chest x-ray  Official radiology report(s): Ct Angio Chest Pe W And/or Wo Contrast  Result Date: 07/14/2017 CLINICAL DATA:  Acute onset of midsternal chest pain. EXAM: CT ANGIOGRAPHY CHEST WITH CONTRAST TECHNIQUE: Multidetector CT imaging of the chest was performed using the standard protocol during bolus administration of intravenous contrast. Multiplanar CT image reconstructions and MIPs were obtained to evaluate the vascular anatomy. CONTRAST:  75mL ISOVUE-370 IOPAMIDOL (ISOVUE-370) INJECTION 76% COMPARISON:  CT of the chest performed 05/13/2017 FINDINGS: Cardiovascular:  There is no evidence of pulmonary embolus. There is aneurysmal dilatation of the ascending thoracic aorta to 5.0 cm in AP dimension. The aortic arch measures 4.5 cm in diameter, while the descending thoracic aorta measures 4.3 cm in AP dimension. The great vessels are grossly unremarkable in appearance. The heart is borderline enlarged, with mild biatrial  enlargement. Mediastinum/Nodes: The mediastinum is otherwise unremarkable in appearance. No mediastinal lymphadenopathy is seen. No pericardial effusion is identified. The thyroid gland is grossly unremarkable. No axillary lymphadenopathy is seen. Lungs/Pleura: Minimal right-sided atelectasis is noted. No pleural effusion or pneumothorax is seen. No masses are identified. Upper Abdomen: The visualized portions of the liver and spleen are unremarkable. The visualized portions of the pancreas, adrenal glands and kidneys are within normal limits. Scattered calcification is seen along the proximal abdominal aorta. Musculoskeletal: No acute osseous abnormalities are identified. The  visualized musculature is unremarkable in appearance. Review of the MIP images confirms the above findings. IMPRESSION: 1. No evidence of pulmonary embolus. 2. 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 Recommend semi-annual imaging followup by CTA or MRA and referral to cardiothoracic surgery if not already obtained. This recommendation follows 2010 ACCF/AHA/AATS/ACR/ASA/SCA/SCAI/SIR/STS/SVM Guidelines for the Diagnosis and Management of Patients With Thoracic Aortic Disease. Circulation. 2010; 121: V818-M037 3. Borderline cardiomegaly, with mild biatrial enlargement. 4. Minimal right-sided atelectasis.  Lungs otherwise clear. Aortic Atherosclerosis (ICD10-I70.0). Electronically Signed   By: Roanna Raider M.D.   On: 07/14/2017 04:27   Dg Chest Port 1 View  Result Date: 07/14/2017 CLINICAL DATA:  Chest pain EXAM: PORTABLE CHEST 1 VIEW COMPARISON:  05/13/2017 FINDINGS: Mild cardiomegaly.  No consolidation or effusion.  No pneumothorax. IMPRESSION: No active disease.  Mild cardiomegaly. Electronically Signed   By: Jasmine Pang M.D.   On: 07/14/2017 02:14      Procedures   ____________________________________________   INITIAL IMPRESSION / ASSESSMENT AND PLAN / ED COURSE  As part of my  medical decision making, I reviewed the following data within the electronic MEDICAL RECORD [id number] year old male presenting with chest pain and shortness of breath EKG revealed atrial fibrillation but no evidence of ischemia or infarction.  Lab data notable for positive d-dimer and as such CT scan of chest performed to evaluate for pulmonary emboli.  CT did not reveal any pulmonary emboli however did reveal a thoracic aneurysm that is 5 cm.  In addition troponin negative however patient with ongoing chest pain and shortness of breath as such patient discussed with Dr. Sheryle Hail for hospital admission for further evaluation and management.  Patient received DuoNeb breathing treatment in the emergency department she received aspirin from EMS. ____________________________________________  FINAL CLINICAL IMPRESSION(S) / ED DIAGNOSES  Final diagnoses:  Chest pain, unspecified type     MEDICATIONS GIVEN DURING THIS VISIT:  Medications  ipratropium-albuterol (DUONEB) 0.5-2.5 (3) MG/3ML nebulizer solution 6 mL (6 mLs Nebulization Given 07/14/17 0205)  LORazepam (ATIVAN) injection 1 mg (1 mg Intravenous Given 07/14/17 0228)  iopamidol (ISOVUE-370) 76 % injection 75 mL (75 mLs Intravenous Contrast Given 07/14/17 0403)     ED Discharge Orders    None       Note:  This document was prepared using Dragon voice recognition software and may include unintentional dictation errors.    Darci Current, MD 07/14/17 [phone]

## 2017-07-14 NOTE — H&P (Signed)
Eugene Henderson is an 67 y.o. male.   Chief Complaint: Chest pain HPI: The patient with past medical history of CHF, hypertension, atrial fibrillation and hypothyroidism presents to the emergency department complaining of chest pain.  Patient states his pain woke him from sleep and radiated slightly into his left shoulder/axilla.  The patient could not catch his breath which prompted him to seek evaluation in the emergency department.  Initial EKG and cardiac biomarkers were reassuring however the patient continued to have vague chest pain.  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  He did not have any hemoptysis.  Due to ongoing chest pain the emergency department staff called the hospitalist service for admission.  Past Medical History:  Diagnosis Date  . Arthritis   . Asthma   . CHF (congestive heart failure) ([place])    a. EF 25-30% by echo in 07/2016 with cath showing no significant CAD b. 01/2017: EF 30-35% with diffuse HK and moderate MR  . Hypertension   . Hyperthyroidism   . Noncompliance   . Persistent atrial fibrillation [facility])     Past Surgical History:  Procedure Laterality Date  . JOINT REPLACEMENT Right   . RIGHT/LEFT HEART CATH AND CORONARY ANGIOGRAPHY N/A 08/02/2016   Procedure: Right/Left Heart Cath and Coronary Angiography;  Surgeon: Wellington Hampshire, MD;  Location: [facility];  Service: Cardiovascular;  Laterality: N/A;  . TESTICLE SURGERY     Patient states that he had to have the tube fixed.    Family History  Problem Relation Age of Onset  . Diabetes Mother   . Diabetes Father   . Heart disease Brother   . Heart attack Brother   . Diabetes Brother   . Kidney failure Brother    Social History:  reports that he has been smoking cigarettes.  He has a 6.25 pack-year smoking history. he has never used smokeless tobacco. He reports that he drinks alcohol. He reports that he does not use drugs.  Allergies: No Known  Allergies  Prior to Admission medications   Medication Sig Start Date End Date Taking? Authorizing Provider  acetaminophen (TYLENOL) 500 MG tablet Take 500 mg by mouth every 6 (six) hours as needed.   Yes [provider]  albuterol (PROVENTIL HFA;VENTOLIN HFA) 108 (90 Base) MCG/ACT inhaler Inhale 2 puffs into the lungs every 6 (six) hours as needed for wheezing or shortness of breath. 07/05/17  Yes Darylene Price A, FNP  apixaban (ELIQUIS) 5 MG TABS tablet Take 1 tablet (5 mg total) by mouth 2 (two) times daily. 05/09/17  Yes Tate, Megan P, DO  budesonide-formoterol (SYMBICORT) 160-4.5 MCG/ACT inhaler Inhale 2 puffs into the lungs 2 (two) times daily. 07/05/17  Yes Hackney, Otila Kluver A, FNP  digoxin (LANOXIN) 0.125 MG tablet Take 1 tablet (0.125 mg total) by mouth daily. 05/09/17  Yes Colberg, Megan P, DO  furosemide (LASIX) 40 MG tablet Take 1 tablet (40 mg total) by mouth daily as needed. For SOB/LE edema 05/09/17  Yes Clayton, Megan P, DO  methimazole (TAPAZOLE) 10 MG tablet Take 2 tablets (20 mg total) by mouth 2 (two) times daily. 05/09/17  Yes Neiswonger, Megan P, DO  metoprolol (TOPROL-XL) 200 MG 24 hr tablet Take 1 tablet (200 mg total) by mouth daily. Take with or immediately following a meal. 05/09/17  Yes Zilberman, Megan P, DO  naphazoline-pheniramine (NAPHCON-A) 0.025-0.3 % ophthalmic solution Place 1 drop into both eyes 4 (four) times daily  as needed for eye irritation. 05/06/17  Yes Sable Feil, PA-C  potassium chloride SA (K-DUR,KLOR-CON) 20 MEQ tablet Take 1 tablet (20 mEq total) by mouth daily. 07/05/17  Yes Hackney, Tina A, FNP  sacubitril-valsartan (ENTRESTO) 24-26 MG Take 1 tablet by mouth 2 (two) times daily.   Yes [provider]  spironolactone (ALDACTONE) 25 MG tablet Take 0.5 tablets (12.5 mg total) by mouth daily. 07/05/17  Yes Hackney, Otila Kluver A, FNP  cyclobenzaprine (FLEXERIL) 10 MG tablet Take 1 tablet (10 mg total) by mouth at bedtime. Patient not taking: Reported on  07/14/2017 05/18/17   Park Liter P, DO  feeding supplement (BOOST / RESOURCE BREEZE) LIQD Take 1 Container by mouth 2 (two) times daily between meals. 11/26/16   Max Sane, MD  gentamicin (GARAMYCIN) 0.3 % ophthalmic solution Place 1 drop into both eyes every 4 (four) hours. Patient not taking: Reported on 07/05/2017 05/06/17   Sable Feil, PA-C  metroNIDAZOLE (FLAGYL) 500 MG tablet Take 1 tablet (500 mg total) by mouth 3 (three) times daily. Patient not taking: Reported on 07/05/2017 05/16/17   Florene Glen, MD  moxifloxacin (VIGAMOX) 0.5 % ophthalmic solution Place 1 drop into the right eye as directed.    [provider]  prednisoLONE acetate (PRED FORTE) 1 % ophthalmic suspension Place 1 drop into the right eye 4 (four) times daily.    [provider]  tobramycin (TOBREX) 0.3 % ophthalmic ointment Place into both eyes every 4 (four) hours while awake. Patient not taking: Reported on 07/05/2017 05/16/17   Florene Glen, MD     Results for orders placed or performed during the hospital encounter of 07/14/17 (from the past 48 hour(s))  Basic metabolic panel     Status: Abnormal   Collection Time: 07/14/17  1:58 AM  Result Value Ref Range   Sodium 122 (L) 135 - 145 mmol/L   Potassium 3.0 (L) 3.5 - 5.1 mmol/L    Comment: HEMOLYSIS AT THIS LEVEL MAY AFFECT RESULT   Chloride 82 (L) 101 - 111 mmol/L   CO2 25 22 - 32 mmol/L   Glucose, Bld 141 (H) 65 - 99 mg/dL   BUN 20 6 - 20 mg/dL   Creatinine, Ser 1.29 (H) 0.61 - 1.24 mg/dL   Calcium 8.9 8.9 - 10.3 mg/dL   GFR calc non Af Amer 56 (L) >60 mL/min   GFR calc Af Amer >60 >60 mL/min    Comment: (NOTE) The eGFR has been calculated using the CKD EPI equation. This calculation has not been validated in all clinical situations. eGFR's persistently <60 mL/min signify possible Chronic Kidney Disease.    Anion gap 15 5 - 15    Comment: Performed at [facility], [address] [place], [place] [address]   CBC     Status: Abnormal   Collection Time: 07/14/17  1:58 AM  Result Value Ref Range   WBC 6.5 3.8 - 10.6 K/uL   RBC 5.74 4.40 - 5.90 MIL/uL   Hemoglobin 16.4 13.0 - 18.0 g/dL   HCT 47.4 40.0 - 52.0 %   MCV 82.6 80.0 - 100.0 fL   MCH 28.6 26.0 - 34.0 pg   MCHC 34.6 32.0 - 36.0 g/dL   RDW 15.3 (H) 11.5 - 14.5 %   Platelets 237 150 - 440 K/uL    Comment: Performed at [facility], [address] [place], [place] [address]  Troponin I     Status: None   Collection  Time: 07/14/17  1:58 AM  Result Value Ref Range   Troponin I <0.03 <0.03 ng/mL    Comment: Performed at [facility], [address] [place], [place] [address]  Fibrin derivatives D-Dimer     Status: Abnormal   Collection Time: 07/14/17  1:58 AM  Result Value Ref Range   Fibrin derivatives D-dimer ([facility]) 1,024.20 (H) 0.00 - 499.00 ng/mL (FEU)    Comment: (NOTE) <> Exclusion of Venous Thromboembolism (VTE) - OUTPATIENT ONLY   (Emergency Department or [place])   0-499 ng/ml (FEU): With a low to intermediate pretest probability                      for VTE this test result excludes the diagnosis                      of VTE.   >499 ng/ml (FEU) : VTE not excluded; additional work up for VTE is                      required. <> Testing on Inpatients and Evaluation of Disseminated Intravascular   Coagulation (DIC) Reference Range:   0-499 ng/ml (FEU) Performed at [facility], [address] [place], [place] [address]   Troponin I     Status: None   Collection Time: 07/14/17  5:53 AM  Result Value Ref Range   Troponin I <0.03 <0.03 ng/mL    Comment: Performed at [facility], [place], [place] [address]   Ct Angio Chest Pe W And/or Wo Contrast  Result Date: 07/14/2017 CLINICAL DATA:  Acute onset of midsternal chest pain. EXAM: CT ANGIOGRAPHY CHEST WITH CONTRAST TECHNIQUE: Multidetector CT imaging of the chest was performed using the standard protocol during bolus  administration of intravenous contrast. Multiplanar CT image reconstructions and MIPs were obtained to evaluate the vascular anatomy. CONTRAST:  48m ISOVUE-370 IOPAMIDOL (ISOVUE-370) INJECTION 76% COMPARISON:  CT of the chest performed 05/13/2017 FINDINGS: Cardiovascular:  There is no evidence of pulmonary embolus. There is aneurysmal dilatation of the ascending thoracic aorta to 5.0 cm in AP dimension. The aortic arch measures 4.5 cm in diameter, while the descending thoracic aorta measures 4.3 cm in AP dimension. The great vessels are grossly unremarkable in appearance. The heart is borderline enlarged, with mild biatrial enlargement. Mediastinum/Nodes: The mediastinum is otherwise unremarkable in appearance. No mediastinal lymphadenopathy is seen. No pericardial effusion is identified. The thyroid gland is grossly unremarkable. No axillary lymphadenopathy is seen. Lungs/Pleura: Minimal right-sided atelectasis is noted. No pleural effusion or pneumothorax is seen. No masses are identified. Upper Abdomen: The visualized portions of the liver and spleen are unremarkable. The visualized portions of the pancreas, adrenal glands and kidneys are within normal limits. Scattered calcification is seen along the proximal abdominal aorta. Musculoskeletal: No acute osseous abnormalities are identified. The visualized musculature is unremarkable in appearance. Review of the MIP images confirms the above findings. IMPRESSION: 1. No evidence of pulmonary embolus. 2. 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 Recommend semi-annual imaging followup by CTA or MRA and referral to cardiothoracic surgery if not already obtained. This recommendation follows 2010 ACCF/AHA/AATS/ACR/ASA/SCA/SCAI/SIR/STS/SVM Guidelines for the Diagnosis and Management of Patients With Thoracic Aortic Disease. Circulation. 2010; 121: [id number]. Borderline cardiomegaly, with mild biatrial enlargement. 4. Minimal  right-sided atelectasis.  Lungs otherwise clear. Aortic Atherosclerosis (ICD10-I70.0). Electronically Signed   By: JFrancoise SchaumannD.  On: 07/14/2017 04:27   Dg Chest Port 1 View  Result Date: 07/14/2017 CLINICAL DATA:  Chest pain EXAM: PORTABLE CHEST 1 VIEW COMPARISON:  05/13/2017 FINDINGS: Mild cardiomegaly.  No consolidation or effusion.  No pneumothorax. IMPRESSION: No active disease.  Mild cardiomegaly. Electronically Signed   By: Donavan Foil M.D.   On: 07/14/2017 02:14    Review of Systems  Constitutional: Negative for chills and fever.  HENT: Negative for sore throat and tinnitus.   Eyes: Negative for blurred vision and redness.  Respiratory: Positive for shortness of breath. Negative for cough.   Cardiovascular: Positive for chest pain. Negative for palpitations, orthopnea and PND.  Gastrointestinal: Negative for abdominal pain, diarrhea, nausea and vomiting.  Genitourinary: Negative for dysuria, frequency and urgency.  Musculoskeletal: Negative for joint pain and myalgias.  Skin: Negative for rash.       No lesions  Neurological: Negative for speech change, focal weakness and weakness.  Endo/Heme/Allergies: Does not bruise/bleed easily.       No temperature intolerance  Psychiatric/Behavioral: Negative for depression and suicidal ideas.    Blood pressure (!) 128/111, pulse (!) 42, temperature 97.6 F (36.4 C), temperature source Oral, resp. rate 19, height 6' (1.829 m), weight 117.9 kg (260 lb), SpO2 100 %. Physical Exam  Constitutional: He is oriented to person, place, and time. He appears well-developed and well-nourished. No distress.  HENT:  Head: Normocephalic and atraumatic.  Mouth/Throat: Oropharynx is clear and moist.  Eyes: Conjunctivae and EOM are normal. Pupils are equal, round, and reactive to light. No scleral icterus.  Neck: Normal range of motion. Neck supple. No JVD present. No tracheal deviation present. No thyromegaly present.  Cardiovascular: Normal  rate, regular rhythm and normal heart sounds. Exam reveals no gallop and no friction rub.  No murmur heard. Respiratory: Effort normal and breath sounds normal. No respiratory distress. He has no wheezes.  GI: Soft. Bowel sounds are normal. He exhibits no distension. There is no tenderness.  Genitourinary:  Genitourinary Comments: Deferred  Musculoskeletal: Normal range of motion. He exhibits no edema.  Lymphadenopathy:    He has no cervical adenopathy.  Neurological: He is alert and oriented to person, place, and time. No cranial nerve deficit.  Skin: Skin is warm and dry. No rash noted.  Psychiatric: He has a normal mood and affect. His behavior is normal. Judgment and thought content normal.     Assessment/Plan This is a 67 year old male admitted for chest pain. 1.  Chest pain: I have applied nitroglycerin paste on the patient's chest.  His chest pain is nearly completely resolved.  Continue to monitor telemetry.  Follow cardiac biomarkers.  Consult cardiology. 2.  Atrial for ablation: Rate controlled; continue Eliquis and metoprolol 3.  CHF: Chronic; systolic.  Continue Lanoxin, Lasix, spironolactone and Entresto 4.  Hyperthyroidism: Continue methimazole.  Check TSH. 5.  Recurrent conjunctivitis: Continue eyedrops 6.  DVT prophylaxis: Full dose anticoagulation as above 7.  GI prophylaxis: None The patient is a full code.  Time spent on admission orders and patient care approximately 45 minutes  Harrie Foreman, MD 07/14/2017, 8:07 AM

## 2017-07-14 NOTE — Consult Note (Signed)
Cardiology Consultation:   Patient ID: Latroy Gaymon; [id number]; 01-Nov-1950   Admit date: 07/14/2017 Date of Consult: 07/14/2017  Primary Care Provider: Dorcas Carrow, DO Primary Cardiologist: Kirke Corin   Patient Profile:   Davinder Haff is a 67 y.o. male with a hx of chronic systolic CHF due to NICM, persistent A. fib on Eliquis, hyperthyroidism previously not on medications, moderate mitral regurgitation, moderate pulmonary hypertension, asthma, and noncompliance who is being seen today for the evaluation of chest pain at the request of Dr. Sheryle Hail.  History of Present Illness:   Mr. Leonhard was hospitalized in 07/2016 with acute systolic CHF in the setting of A. fib with RVR. Echocardiogram at that time showed an EF of 25-30%, moderate mitral regurgitation, and moderate pulmonary hypertension with a PASP of 50 mmHg. He underwent right and left heart catheterization that showed no significant CAD, severely reduced LV systolic function by echo (LV gram was not performed), right heart catheterization showed a minimally elevated filling pressures and pulmonary hypertension. Moderately reduced cardiac output. RA pressure: 12 mmHg, RV pressure: 40 over 6 mmHg, PA pressure 37/12 with a mean of 12 mmHg. Pulmonary capillary wedge pressure was 12. Cardiac output was 4.16 with a cardiac index of 1.77. 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 He was rate controlled and started on Eliquis. He was also noted to have apneic episodes during the cardiac catheterization with recommendation for outpatient sleep study. He was last evaluated in our office in 10/2016 and was compliant with medications after establishing Medicaid. He has been following in the [facility] CHF clinic. He was overall feeling better though reported weight gain since 08/2016 secondary to poor diet choices. At that time, he continued to be in atrial fibrillation with a reasonably controlled  ventricular rates. Follow-up echocardiogram in 01/2017 showed moderate LVH, EF 30-35%, diffuse hypokinesis, unable to exclude regional wall motion abnormalities, moderately dilated aortic root at 47 mm, moderately dilated a sending aorta at 50 mm, moderate mitral regurgitation, severely dilated left atrium measuring 58 mm, mildly dilated RV with RV systolic function mildly to moderately reduced, severely dilated right atrium, mildly to moderately elevated pulmonary arterial pressure in the range of 40-45 mmHg.   The patient was most recently seen by the [facility] CHF clinic on 07/05/2017 and reported shortness of breath with exertion at that time. Weight was down 13 pounds from prior visit in 02/2017. Blood pressure was noted to be elevated at 155/105, heart rate 117 bpm. He was felt to be euvolemic at that time. Reportedly, BP improved upon recheck (details unclear). No changes were made.   Patient was most recently admitted to the hospital in 05/2017 secondary to perforated diverticulitis. He was also noted to have right eye conjunctivitis. He was managed conservatively with antibiotics.   Patient was in his usual state of health until the evening of 2/6 when he was woken up with substernal chest pain and a sensation that he could not catch his breath. Pain was rated 7/10. Pain began to then radiate down to his legs. Patient reports this pain was similar to his pain back in 07/2016 admission, though worse. He reports compliance with his medications, though does admit to dietary indiscretions. Weight has been running between 255-267 pounds at home. Stable 2-pillow orthopnea. No early satiety, though does report lack of taste for some foods. No LE swelling. Intermittent abdominal swelling.    Upon his arrival to [facility], he was initially noted to be hypotensive with blood  pressure of 88/59, heart rate 90 bpm, afebrile, oxygen saturation 99% on room air, weight 260 pounds. Troponin negative x2. D-dimer  elevated at 1024 with CTA chest negative for PE. CTA chest did show a sending thoracic aorta aneurysm measuring 5.0 cm in AP dimension. Chest x-ray without any active cardiopulmonary disease with mild cardiomegaly. White blood cell count 6.5, hemoglobin 16.4, platelet count 237. Sodium 122, potassium 3.0 with hemolysis noted, glucose 141, BUN 20, serum creatinine 1.29. EKG showed A. fib, 99 bpm, LVH with nonspecific IVCD, prolonged QT, poor R wave progression, nonspecific ST-T changes. Blood pressure has improved to 107/75. In the ED he was given a DuoNeb and Ativan. Upon admission, cardiology was consulted. Currently notes improvement in his chest pain from 7/10 to 5/10.   Past Medical History:  Diagnosis Date  . Arthritis   . Asthma   . CHF (congestive heart failure) (HCC)    a. EF 25-30% by echo in 07/2016 with cath showing no significant CAD b. 01/2017: EF 30-35% with diffuse HK and moderate MR  . Hypertension   . Hyperthyroidism   . Noncompliance   . Persistent atrial fibrillation [facility])     Past Surgical History:  Procedure Laterality Date  . JOINT REPLACEMENT Right   . RIGHT/LEFT HEART CATH AND CORONARY ANGIOGRAPHY N/A 08/02/2016   Procedure: Right/Left Heart Cath and Coronary Angiography;  Surgeon: Iran Ouch, MD;  Location: [facility] INVASIVE CV LAB;  Service: Cardiovascular;  Laterality: N/A;  . TESTICLE SURGERY     Patient states that he had to have the tube fixed.     Home Meds: Prior to Admission medications   Medication Sig Start Date End Date Taking? Authorizing Provider  acetaminophen (TYLENOL) 500 MG tablet Take 500 mg by mouth every 6 (six) hours as needed.   Yes [provider]  albuterol (PROVENTIL HFA;VENTOLIN HFA) 108 (90 Base) MCG/ACT inhaler Inhale 2 puffs into the lungs every 6 (six) hours as needed for wheezing or shortness of breath. 07/05/17  Yes Clarisa Kindred A, FNP  apixaban (ELIQUIS) 5 MG TABS tablet Take 1 tablet (5 mg total) by mouth 2 (two)  times daily. 05/09/17  Yes Luoma, Megan P, DO  budesonide-formoterol (SYMBICORT) 160-4.5 MCG/ACT inhaler Inhale 2 puffs into the lungs 2 (two) times daily. 07/05/17  Yes Hackney, Inetta Fermo A, FNP  digoxin (LANOXIN) 0.125 MG tablet Take 1 tablet (0.125 mg total) by mouth daily. 05/09/17  Yes Ferran, Megan P, DO  furosemide (LASIX) 40 MG tablet Take 1 tablet (40 mg total) by mouth daily as needed. For SOB/LE edema 05/09/17  Yes Molinelli, Megan P, DO  methimazole (TAPAZOLE) 10 MG tablet Take 2 tablets (20 mg total) by mouth 2 (two) times daily. 05/09/17  Yes Ellerby, Megan P, DO  metoprolol (TOPROL-XL) 200 MG 24 hr tablet Take 1 tablet (200 mg total) by mouth daily. Take with or immediately following a meal. 05/09/17  Yes Resnik, Megan P, DO  naphazoline-pheniramine (NAPHCON-A) 0.025-0.3 % ophthalmic solution Place 1 drop into both eyes 4 (four) times daily as needed for eye irritation. 05/06/17  Yes Joni Reining, PA-C  potassium chloride SA (K-DUR,KLOR-CON) 20 MEQ tablet Take 1 tablet (20 mEq total) by mouth daily. 07/05/17  Yes Hackney, Tina A, FNP  sacubitril-valsartan (ENTRESTO) 24-26 MG Take 1 tablet by mouth 2 (two) times daily.   Yes [provider]  spironolactone (ALDACTONE) 25 MG tablet Take 0.5 tablets (12.5 mg total) by mouth daily. 07/05/17  Yes Delma Freeze, FNP  cyclobenzaprine (FLEXERIL) 10 MG tablet Take 1 tablet (10 mg total) by mouth at bedtime. Patient not taking: Reported on 07/14/2017 05/18/17   Olevia Perches P, DO  feeding supplement (BOOST / RESOURCE BREEZE) LIQD Take 1 Container by mouth 2 (two) times daily between meals. 11/26/16   Delfino Lovett, MD  gentamicin (GARAMYCIN) 0.3 % ophthalmic solution Place 1 drop into both eyes every 4 (four) hours. Patient not taking: Reported on 07/05/2017 05/06/17   Joni Reining, PA-C  metroNIDAZOLE (FLAGYL) 500 MG tablet Take 1 tablet (500 mg total) by mouth 3 (three) times daily. Patient not taking: Reported on 07/05/2017 05/16/17    Lattie Haw, MD  moxifloxacin (VIGAMOX) 0.5 % ophthalmic solution Place 1 drop into the right eye as directed.    [provider]  prednisoLONE acetate (PRED FORTE) 1 % ophthalmic suspension Place 1 drop into the right eye 4 (four) times daily.    [provider]  tobramycin (TOBREX) 0.3 % ophthalmic ointment Place into both eyes every 4 (four) hours while awake. Patient not taking: Reported on 07/05/2017 05/16/17   Lattie Haw, MD    Inpatient Medications: Scheduled Meds: . potassium chloride  40 mEq Oral BID   Continuous Infusions:  PRN Meds:   Allergies:  No Known Allergies  Social History:   Social History   Socioeconomic History  . Marital status: Single    Spouse name: Not on file  . Number of children: Not on file  . Years of education: Not on file  . Highest education level: Not on file  Social Needs  . Financial resource strain: Not on file  . Food insecurity - worry: Not on file  . Food insecurity - inability: Not on file  . Transportation needs - medical: Not on file  . Transportation needs - non-medical: Not on file  Occupational History  . Occupation: retired  Tobacco Use  . Smoking status: Current Some Day Smoker    Packs/day: 0.25    Years: 25.00    Pack years: 6.25    Types: Cigarettes  . Smokeless tobacco: Never Used  Substance and Sexual Activity  . Alcohol use: Yes    Comment: Socially - once a month  . Drug use: No  . Sexual activity: Not on file  Other Topics Concern  . Not on file  Social History Narrative  . Not on file     Family History:   Family History  Problem Relation Age of Onset  . Diabetes Mother   . Diabetes Father   . Heart disease Brother   . Heart attack Brother   . Diabetes Brother   . Kidney failure Brother     ROS:  Review of Systems  Constitutional: Positive for malaise/fatigue. Negative for chills, diaphoresis, fever and weight loss.  HENT: Negative for congestion.   Eyes:  Positive for pain and redness. Negative for discharge.  Respiratory: Positive for shortness of breath. Negative for cough, hemoptysis, sputum production and wheezing.   Cardiovascular: Positive for chest pain. Negative for palpitations, orthopnea, claudication, leg swelling and PND.  Gastrointestinal: Negative for abdominal pain, blood in stool, heartburn, melena, nausea and vomiting.  Genitourinary: Negative for hematuria.  Musculoskeletal: Negative for falls and myalgias.  Skin: Negative for rash.  Neurological: Positive for weakness. Negative for dizziness, tingling, tremors, sensory change, speech change, focal weakness and loss of consciousness.  Endo/Heme/Allergies: Does not bruise/bleed easily.  Psychiatric/Behavioral: Negative for substance abuse. The patient is not nervous/anxious.   All  other systems reviewed and are negative.     Physical Exam/Data:   Vitals:   07/14/17 0330 07/14/17 0500 07/14/17 0600 07/14/17 0700  BP: (!) 115/95 107/75 100/80 (!) 128/111  Pulse:  (!) 42    Resp: 13 20 19    Temp:      TempSrc:      SpO2:  100%    Weight:      Height:       No intake or output data in the 24 hours ending 07/14/17 0752 Filed Weights   07/14/17 0209  Weight: 260 lb (117.9 kg)   Body mass index is 35.26 kg/m.   Physical Exam: General: Well developed, well nourished, in no acute distress. Head: Normocephalic, atraumatic, sclera non-icteric, no xanthomas, nares without discharge. Neck: Negative for carotid bruits. JVD difficult to assess 2/2 body habitus. Lungs: Clear bilaterally to auscultation without wheezes, rales, or rhonchi. Breathing is unlabored. Heart: Irregularly irregular with S1 S2. No murmurs, rubs, or gallops appreciated. Abdomen: Soft, non-tender, non-distended with normoactive bowel sounds. No hepatomegaly. No rebound/guarding. No obvious abdominal masses. Msk:  Strength and tone appear normal for age. Extremities: No clubbing or cyanosis. No edema.  Distal pedal pulses are 2+ and equal bilaterally. Neuro: Alert and oriented X 3. No facial asymmetry. No focal deficit. Moves all extremities spontaneously. Psych:  Responds to questions appropriately with a normal affect.   EKG:  The EKG was personally reviewed and demonstrates: A. fib, 99 bpm, LVH with nonspecific IVCD, prolonged QT, poor R wave progression, nonspecific ST-T changes Telemetry:  Telemetry was personally reviewed and demonstrates: Afib, 90s bpm  Weights: [employer]   07/14/17 0209  Weight: 260 lb (117.9 kg)    Relevant CV Studies: [facility] 07/2016: Coronary Findings   Diagnostic  Dominance: Right  Left Main  Vessel is angiographically normal.  Left Anterior Descending  First Diagonal Branch  Vessel is angiographically normal.  Second Diagonal Branch  Vessel is angiographically normal.  Third Diagonal Branch  Vessel is small in size. Vessel is angiographically normal.  Left Circumflex  Vessel is angiographically normal.  First Obtuse Marginal Branch  Vessel is angiographically normal.  Second Obtuse Marginal Branch  Vessel is angiographically normal.  Third Obtuse Marginal Branch  Vessel is angiographically normal.  Right Coronary Artery  Vessel is angiographically normal.  Right Posterior Descending Artery  Vessel is angiographically normal.  Right Posterior Atrioventricular Branch  Vessel is angiographically normal.  First Right Posterolateral  Vessel is angiographically normal.  Second Right Posterolateral  Vessel is angiographically normal.  Third Right Posterolateral  Vessel is angiographically normal.  Intervention   No interventions have been documented.  Coronary Diagrams   Diagnostic Diagram        Conclusion   1. No significant coronary artery disease. 2. Severely reduced LV systolic function by echo. Left ventricular angiography was not performed.  3. Right heart catheterization showed minimally elevated filling pressures and  pulmonary hypertension. Moderately reduced cardiac output. RA pressure: 12 mmHg, RV pressure: 40 over 6 mmHg, PA pressure 37/12 with a mean of 12 mmHg. Pulmonary capillary wedge pressure was 12. Cardiac output was 4.16 with a cardiac index of 1.77.  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 His volume status appears to be close to normal. Thus, I switched furosemide to 40 mg by mouth twice daily. Ventricular rate is still not optimally controlled and thus I increased Toprol to 200 mg once daily. Start long-term anticoagulation with Eliquis 5 mg twice daily starting  tomorrow.  The patient was noted to have apnea episodes during cardiac catheterization. Recommend outpatient evaluation for sleep apnea. Avoid catheterization via the right radial artery in the future due to tortuous innominate artery and significantly dilated aortic root.    TTE 07/2016: Study Conclusions  - Left ventricle: The cavity size was mildly dilated. There was   moderate concentric hypertrophy. Systolic function was severely   reduced. The estimated ejection fraction was in the range of 25%   to 30%. Diffuse hypokinesis. - Aorta: Aortic root dimension: 50 mm (ED). - Aortic root: The aortic root was moderately dilated. - Mitral valve: There was moderate regurgitation. - Left atrium: The atrium was severely dilated. - Right atrium: The atrium was severely dilated. - Pulmonary arteries: Systolic pressure was moderately increased.   PA peak pressure: 50 mm Hg (S).  TTE 01/2017: Study Conclusions  - Left ventricle: The cavity size was normal. Wall thickness was   increased in a pattern of moderate LVH. Systolic function was   moderately to severely reduced. The estimated ejection fraction   was in the range of 30% to 35%. Diffuse hypokinesis. Regional   wall motion abnormalities cannot be excluded. - Aorta: Aortic root dimension: 47 mm (ED). Ascending aortic   diameter:  50 mm (S). - Aortic root: The aortic root was moderately dilated. - Ascending aorta: The ascending aorta was moderately dilated. - Mitral valve: There was moderate regurgitation. - Left atrium: The atrium was severely dilated. - Right ventricle: The cavity size was mildly dilated. Systolic   function was mildly to moderately reduced. - Right atrium: The atrium was severely dilated. - Pulmonary arteries: Systolic pressure was mildly to moderately   increased, in the range of 40 mm Hg to 45 mm Hg.   Laboratory Data:  Chemistry Recent Labs  Lab 07/14/17 0158  NA 122*  K 3.0*  CL 82*  CO2 25  GLUCOSE 141*  BUN 20  CREATININE 1.29*  CALCIUM 8.9  GFRNONAA 56*  GFRAA >60  ANIONGAP 15    No results for input(s): PROT, ALBUMIN, AST, ALT, ALKPHOS, BILITOT in the last 168 hours. Hematology Recent Labs  Lab 07/14/17 0158  WBC 6.5  RBC 5.74  HGB 16.4  HCT 47.4  MCV 82.6  MCH 28.6  MCHC 34.6  RDW 15.3*  PLT 237   Cardiac Enzymes Recent Labs  Lab 07/14/17 0158 07/14/17 0553  TROPONINI <0.03 <0.03   No results for input(s): TROPIPOC in the last 168 hours.  BNPNo results for input(s): BNP, PROBNP in the last 168 hours.  DDimer No results for input(s): DDIMER in the last 168 hours.  Radiology/Studies:  Ct Angio Chest Pe W And/or Wo Contrast  Result Date: 07/14/2017 IMPRESSION: 1. No evidence of pulmonary embolus. 2. 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 Recommend semi-annual imaging followup by CTA or MRA and referral to cardiothoracic surgery if not already obtained. This recommendation follows 2010 ACCF/AHA/AATS/ACR/ASA/SCA/SCAI/SIR/STS/SVM Guidelines for the Diagnosis and Management of Patients With Thoracic Aortic Disease. Circulation. 2010; 121: K742-V956 3. Borderline cardiomegaly, with mild biatrial enlargement. 4. Minimal right-sided atelectasis.  Lungs otherwise clear. Aortic Atherosclerosis (ICD10-I70.0). Electronically  Signed   By: Roanna Raider M.D.   On: 07/14/2017 04:27   Dg Chest Port 1 View  Result Date: 07/14/2017 IMPRESSION: No active disease.  Mild cardiomegaly. Electronically Signed   By: Jasmine Pang M.D.   On: 07/14/2017 02:14    Assessment and Plan:   1. Chest  pain with moderate risk for cardiac etiology with known nonobstructive CAD: -Atypical and typical features -Troponin negative x 2, continue to cycle to rule out -Chest pain is reproducible to palpation on exam -Recent LHC 07/2016 that showed no significant CAD -Possibly in the setting of his hypokalemia and dehydration  -Check echo -Could consider Myoview as an outpatient -On Eliquis in place of ASA  2. Chronic Afib: -Ventricular rates well controlled -Hypotension may preclude usage of his PTA Toprol XL -Continue low-dose digoxin for now for rate control, however will need to be cautious given his AKI -Check digoxin level  -Continue Eliquis 5 mg bid -CHADS2VASc at least 3 (CHF, HTN, age x 1)   3. NICM: -He does not appear grossly volume overloaded on exam as his lungs are CTAB, there is no LE swelling, JVD is difficult to assess given habitus -Echo pending as above -Add on BNP -His labs and dark urine would actually indicate a status of volume depletion  -Consider gentle IV hydration, defer to IM -Would hold Entresto, Toprol XL, and spironolactone given his soft BP as detailed below, resume as able  4. AKI: -SCr 1.29 with a baseline of ~ 0.9 -Consider gentle IV hydration as above  5. Hypokalemia: -Replete to goal > 4.0 -Check magnesium with recommendation to replete to goal > 2.0 as needed  6. Hypotension: -Likely in the setting volume depletion with dehydration  -As above  7. 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 Hyponatremia: -Per IM  9. Hyperglycemia: -A1c  pending   For questions or updates, please contact [facility] Please consult [email or web address] for contact info under Cardiology/STEMI.   Signed, Eula Listen, PA-C [facility] Pager: [phone] 07/14/2017, 7:52 AM

## 2017-07-14 NOTE — Care Management (Signed)
Placed in observation for chest pain.  Troponins negative. Thoracic aortic aneurysm.  Vascular consult.  Chronic Eliquis.  Previous Ablation

## 2017-07-14 NOTE — Progress Notes (Signed)
*  PRELIMINARY RESULTS* Echocardiogram 2D Echocardiogram has been performed.  Cristela Blue 07/14/2017, 2:39 PM

## 2017-07-14 NOTE — Plan of Care (Signed)
  Progressing Education: Knowledge of General Education information will improve 07/14/2017 1847 - Progressing by Jerene Dilling, RN Health Behavior/Discharge Planning: Ability to manage health-related needs will improve 07/14/2017 1847 - Progressing by Jerene Dilling, RN Clinical Measurements: Ability to maintain clinical measurements within normal limits will improve 07/14/2017 1847 - Progressing by Jerene Dilling, RN Will remain free from infection 07/14/2017 1847 - Progressing by Edison Pace D, RN Respiratory complications will improve 07/14/2017 1847 - Progressing by Edison Pace D, RN Activity: Risk for activity intolerance will decrease 07/14/2017 1847 - Progressing by Jerene Dilling, RN Nutrition: Adequate nutrition will be maintained 07/14/2017 1847 - Progressing by Jerene Dilling, RN Coping: Level of anxiety will decrease 07/14/2017 1847 - Progressing by Jerene Dilling, RN Pain Managment: General experience of comfort will improve 07/14/2017 1847 - Progressing by Jerene Dilling, RN Safety: Ability to remain free from injury will improve 07/14/2017 1847 - Progressing by Edison Pace D, RN Skin Integrity: Risk for impaired skin integrity will decrease 07/14/2017 1847 - Progressing by Jerene Dilling, RN

## 2017-07-15 DIAGNOSIS — I5022 Chronic systolic (congestive) heart failure: Secondary | ICD-10-CM | POA: Diagnosis not present

## 2017-07-15 DIAGNOSIS — R0789 Other chest pain: Secondary | ICD-10-CM | POA: Diagnosis not present

## 2017-07-15 DIAGNOSIS — I4891 Unspecified atrial fibrillation: Secondary | ICD-10-CM | POA: Diagnosis not present

## 2017-07-15 DIAGNOSIS — R079 Chest pain, unspecified: Secondary | ICD-10-CM | POA: Diagnosis not present

## 2017-07-15 DIAGNOSIS — E0590 Thyrotoxicosis, unspecified without thyrotoxic crisis or storm: Secondary | ICD-10-CM | POA: Diagnosis not present

## 2017-07-15 LAB — BASIC METABOLIC PANEL
Anion gap: 10 (ref 5–15)
BUN: 16 mg/dL (ref 6–20)
CALCIUM: 9.5 mg/dL (ref 8.9–10.3)
CHLORIDE: 89 mmol/L — AB (ref 101–111)
CO2: 28 mmol/L (ref 22–32)
CREATININE: 0.91 mg/dL (ref 0.61–1.24)
GFR calc non Af Amer: >60 mL/min (ref >60)
GLUCOSE: 151 mg/dL — AB (ref 65–99)
Potassium: 3.7 mmol/L (ref 3.5–5.1)
Sodium: 127 mmol/L — ABNORMAL LOW (ref 135–145)

## 2017-07-15 MED ORDER — METOPROLOL SUCCINATE ER 25 MG PO TB24
25.0000 mg | ORAL_TABLET | Freq: Every day | ORAL | Status: DC
  Administered 2017-07-15 – 2017-07-16 (×2): 25 mg via ORAL
  Filled 2017-07-15 (×2): qty 1

## 2017-07-15 MED ORDER — FUROSEMIDE 40 MG PO TABS
40.0000 mg | ORAL_TABLET | Freq: Every day | ORAL | Status: DC
  Administered 2017-07-15 – 2017-07-16 (×2): 40 mg via ORAL
  Filled 2017-07-15 (×2): qty 1

## 2017-07-15 NOTE — Plan of Care (Signed)
Nutrition Education Note  RD consulted for nutrition education regarding new onset CHF.  Spoke with patient at bedside. He states he has been doing poorly since Thanksgiving regarding adherence to low-sodium diet. They also eat out at [employer] or [employer] at least once a week. Discussed with patient the importance of limiting restaurants and fast food.  RD provided "Low Sodium Nutrition Therapy" handout from the Academy of [employer]etics. Reviewed patient's dietary recall. Provided examples on ways to decrease sodium intake in diet. Discouraged intake of processed foods and use of salt shaker. Encouraged fresh fruits and vegetables as well as whole grain sources of carbohydrates to maximize fiber intake.   RD discussed why it is important for patient to adhere to diet recommendations, and emphasized the role of fluids, foods to avoid, and importance of weighing self daily. Teach back method used.  Expect fair compliance.  Body mass index is 34.39 kg/m. Pt meets criteria for obese class II based on current BMI.  Current diet order is heart healthy, patient is consuming approximately 100% of meals at this time. Labs and medications reviewed. No further nutrition interventions warranted at this time. RD contact information provided. If additional nutrition issues arise, please re-consult RD.   Dionne Ano. Shondra Capps, MS, RD LDN Inpatient Clinical Dietitian Pager [phone]

## 2017-07-15 NOTE — Progress Notes (Signed)
Progress Note  Patient Name: Eugene Henderson Date of Encounter: 07/15/2017  Primary Cardiologist: Kirke Corin  Subjective   Less SOB. No chest pain. Has ambulated in his room without issues. Urine color indicating better hydration. Renal function improved following IV hydration.   Inpatient Medications    Scheduled Meds: . apixaban  5 mg Oral BID  . digoxin  0.125 mg Oral Daily  . docusate sodium  100 mg Oral BID  . feeding supplement  1 Container Oral BID BM  . methimazole  20 mg Oral BID  . metoprolol  25 mg Oral Daily  . mometasone-formoterol  2 puff Inhalation BID  . potassium chloride SA  20 mEq Oral Daily  . prednisoLONE acetate  1 drop Right Eye QID   Continuous Infusions:  PRN Meds: acetaminophen **OR** acetaminophen, furosemide, naphazoline-pheniramine, ondansetron **OR** ondansetron (ZOFRAN) IV   Vital Signs    Vitals:   07/14/17 1024 07/14/17 2116 07/15/17 0323 07/15/17 0731  BP: 105/70 (!) 95/41 (!) 113/94 132/81  Pulse: (!) 102 (!) 40 69 (!) 50  Resp:  18 18 20   Temp:  97.6 F (36.4 C) 97.7 F (36.5 C) 97.7 F (36.5 C)  TempSrc:  Oral Oral Oral  SpO2: 99% 100% 99% 100%  Weight:   253 lb 9.6 oz (115 kg)   Height:        Intake/Output Summary (Last 24 hours) at 07/15/2017 1042 Last data filed at 07/15/2017 0827 Gross per 24 hour  Intake 240 ml  Output 3825 ml  Net -3585 ml   Filed Weights   07/14/17 0209 07/15/17 0323  Weight: 260 lb (117.9 kg) 253 lb 9.6 oz (115 kg)    Telemetry    Afib, 90s to 120s bpm - Personally Reviewed  ECG    n/a - Personally Reviewed  Physical Exam   GEN: No acute distress.   Neck: No JVD. Cardiac: Irregularly irregular, no murmurs, rubs, or gallops.  Respiratory: Clear to auscultation bilaterally.  GI: Soft, nontender, non-distended.   MS: No edema; No deformity. Neuro:  Alert and oriented x 3; Nonfocal.  Psych: Normal affect.  Labs    Chemistry Recent Labs  Lab 07/14/17 0158 07/15/17 0836  NA 122* 127*   K 3.0* 3.7  CL 82* 89*  CO2 25 28  GLUCOSE 141* 151*  BUN 20 16  CREATININE 1.29* 0.91  CALCIUM 8.9 9.5  GFRNONAA 56* >60  GFRAA >60 >60  ANIONGAP 15 10     Hematology Recent Labs  Lab 07/14/17 0158  WBC 6.5  RBC 5.74  HGB 16.4  HCT 47.4  MCV 82.6  MCH 28.6  MCHC 34.6  RDW 15.3*  PLT 237    Cardiac Enzymes Recent Labs  Lab 07/14/17 0553 07/14/17 0955 07/14/17 1258 07/14/17 2109  TROPONINI <0.03 <0.03 <0.03 <0.03   No results for input(s): TROPIPOC in the last 168 hours.   BNP Recent Labs  Lab 07/14/17 1258  BNP 38.0     DDimer No results for input(s): DDIMER in the last 168 hours.   Radiology    Ct Angio Chest Pe W And/or Wo Contrast  Result Date: 07/14/2017 IMPRESSION: 1. No evidence of pulmonary embolus. 2. 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 Recommend semi-annual imaging followup by CTA or MRA and referral to cardiothoracic surgery if not already obtained. This recommendation follows 2010 ACCF/AHA/AATS/ACR/ASA/SCA/SCAI/SIR/STS/SVM Guidelines for the Diagnosis and Management of Patients With Thoracic Aortic Disease. Circulation. 2010; 121: Z366-Y403  3. Borderline cardiomegaly, with mild biatrial enlargement. 4. Minimal right-sided atelectasis.  Lungs otherwise clear. Aortic Atherosclerosis (ICD10-I70.0). Electronically Signed   By: Roanna Raider M.D.   On: 07/14/2017 04:27   Dg Chest Port 1 View  Result Date: 07/14/2017 IMPRESSION: No active disease.  Mild cardiomegaly. Electronically Signed   By: Jasmine Pang M.D.   On: 07/14/2017 02:14    Cardiac Studies   TTE 07/14/2017: Study Conclusions  - Left ventricle: The cavity size was normal. There was mild   concentric hypertrophy. Systolic function was moderately to   severely reduced. The estimated ejection fraction was in the   range of 30% to 35%. Diffuse hypokinesis. The study is not   technically sufficient to allow evaluation of LV diastolic    function. - Mitral valve: There was mild regurgitation. - Left atrium: The atrium was mildly dilated. - Right ventricle: Systolic function was normal. - Right atrium: The atrium was mildly dilated. - Pulmonary arteries: Systolic pressure was mildly elevated. PA   peak pressure: 40 mm Hg (S).  Impressions:  - Rhythm is atrial fibrillation.  Patient Profile     67 y.o. male with history of chronic systolic CHF due to NICM, persistent A. fib on Eliquis, hyperthyroidism previously not on medications, moderate mitral regurgitation, moderate pulmonary hypertension, asthma, and noncompliance who is being seen today for the evaluation of chest pain.  Assessment & Plan    1. Chest pain with moderate risk for cardiac etiology with known nonobstructive CAD: -Atypical  -Ruled out -Chest pain is reproducible to palpation on exam -Recent LHC 07/2016 that showed no significant CAD -Possibly in the setting of his hypokalemia and dehydration  -Echo as above -On Eliquis in place of ASA  2. Chronic Afib: -Ventricular rates well controlled -Lower dose Toprol resumed as below -Continue low-dose digoxin for rate control, level of 0.4 on 2/7 -Continue Eliquis 5 mg bid -CHADS2VASc at least 3 (CHF, HTN, age x 1)   3. NICM: -He does not appear grossly volume overloaded on exam as his lungs are CTAB, there is no LE swelling, JVD is difficult to assess given habitus -Echo as above with stable cardiomyopathy -BNP 38 -His labs and dark urine would actually indicate a status of volume depletion  -No evidence of heart failure exacerbation at this time, in fact, he was more likely volume depleted upon admission which has improved with IV fluids -He has been resumed on lower dose Toprol XL 25 mg daily this morning -Would look to add back Entresto and spironolactone as BP allows  4. AKI: -Improved s/p hydration   5. Hypokalemia/hypomagnesemia: -Replete potassium and magnesium to goals of > 4.0/>2.0  respectively   6. Hypotension: -Improved -Slowly resume medications as above  7. 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 Hyponatremia: -Improving  9. Newly diagnosed diabetes: -A1c 6.7 -Recommend diabetic consult -Defer to IM   For questions or updates, please contact [facility] Please consult [email or web address] for contact info under Cardiology/STEMI.    Signed, Eula Listen, PA-C [facility] Pager: [phone] 07/15/2017, 10:42 AM

## 2017-07-15 NOTE — Progress Notes (Signed)
[facility] -  at [facility]   PATIENT NAME: Eugene Henderson    MR#:  [id number]  DATE OF BIRTH:  09/30/1950  SUBJECTIVE: Patient admitted for shortness of breath, left-sided chest pain.  Found to have acute on chronic systolic heart failure with hyponatremia.  He says he feels little better than yesterday.  CHIEF COMPLAINT:   Chief Complaint  Patient presents with  . Chest Pain    REVIEW OF SYSTEMS:   ROS CONSTITUTIONAL: No fever, fatigue or weakness.  EYES: No blurred or double vision.  EARS, NOSE, AND THROAT: No tinnitus or ear pain.  RESPIRATORY: No cough, shortness of breath, wheezing or hemoptysis.  CARDIOVASCULAR: No chest pain, orthopnea, edema.  GASTROINTESTINAL: No nausea, vomiting, diarrhea or abdominal pain.  GENITOURINARY: No dysuria, hematuria.  ENDOCRINE: No polyuria, nocturia,  HEMATOLOGY: No anemia, easy bruising or bleeding SKIN: No rash or lesion. MUSCULOSKELETAL: No joint pain or arthritis.   NEUROLOGIC: No tingling, numbness, weakness.  PSYCHIATRY: No anxiety or depression.   DRUG ALLERGIES:  No Known Allergies  VITALS:  Blood pressure 132/81, pulse (!) 50, temperature 97.7 F (36.5 C), temperature source Oral, resp. rate 20, height 6' (1.829 m), weight 115 kg (253 lb 9.6 oz), SpO2 100 %.  PHYSICAL EXAMINATION:  GENERAL:  67 y.o.-year-old patient lying in the bed with no acute distress.  EYES: Pupils equal, round, reactive to light and accommodation. No scleral icterus. Extraocular muscles intact.  HEENT: Head atraumatic, normocephalic. Oropharynx and nasopharynx clear.  NECK:  Supple, no jugular venous distention. No thyroid enlargement, no tenderness.  LUNGS: Normal breath sounds bilaterally, no wheezing, rales,rhonchi or crepitation. No use of accessory muscles of respiration.  CARDIOVASCULAR: S1, S2 normal. No murmurs, rubs, or gallops.  ABDOMEN: Soft, nontender, nondistended. Bowel sounds present. No organomegaly or  mass.  EXTREMITIES: No pedal edema, cyanosis, or clubbing.  NEUROLOGIC: Cranial nerves II through XII are intact. Muscle strength 5/5 in all extremities. Sensation intact. Gait not checked.  PSYCHIATRIC: The patient is ale26mt FINDINGS: Cardiovascular:  There is no evidence of pulmonary embolus. There is aneurysmal dilatation of the ascending thoracic aorta to 5.0 cm in AP dimension. The aortic arch measures 4.5 cm in diameter, while the descending thoracic aorta measures 4.3 cm in AP dimension. The great vessels are grossly unremarkable in appearance.  The heart is borderline enlarged, with mild biatrial  enlargement. Mediastinum/Nodes: The mediastinum is otherwise unremarkable in appearance. No mediastinal lymphadenopathy is seen. No pericardial effusion is identified. The thyroid gland is grossly unremarkable. No axillary lymphadenopathy is seen. Lungs/Pleura: Minimal right-sided atelectasis is noted. No pleural effusion or pneumothorax is seen. No masses are identified. Upper Abdomen: The visualized portions of the liver and spleen are unremarkable. The visualized portions of the pancreas, adrenal glands and kidneys are within normal limits. Scattered calcification is seen along the proximal abdominal aorta. Musculoskeletal: No acute osseous abnormalities are identified. The visualized musculature is unremarkable in appearance. Review of the MIP images confirms the above findings. IMPRESSION: 1. No evidence of pulmonary embolus. 2. 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 Recommend semi-annual imaging followup by CTA or MRA and referral to cardiothoracic surgery if not already obtained. This recommendation follows 2010 ACCF/AHA/AATS/ACR/ASA/SCA/SCAI/SIR/STS/SVM Guidelines for the Diagnosis and Management of Patients With Thoracic Aortic Disease. Circulation. 2010; 121: O756-E332 3. Borderline cardiomegaly, with mild biatrial enlargement. 4. Minimal right-sided atelectasis.  Lungs otherwise clear. Aortic Atherosclerosis (ICD10-I70.0). Electronically Signed   By: Roanna Raider M.D.   On: 07/14/2017 04:27   Dg Chest Port 1 View  Result Date: 07/14/2017 CLINICAL DATA:  Chest pain EXAM: PORTABLE CHEST 1 VIEW COMPARISON:  05/13/2017 FINDINGS: Mild cardiomegaly.  No consolidation or effusion.  No pneumothorax. IMPRESSION: No active disease.  Mild cardiomegaly. Electronically Signed   By: Jasmine Pang M.D.   On: 07/14/2017 02:14    EKG:   Orders placed or performed during the hospital encounter of 07/14/17  . ED EKG  . ED EKG  . EKG 12-Lead  . EKG 12-Lead  . ED EKG   . ED EKG  . EKG 12-Lead  . EKG 12-Lead  . EKG    ASSESSMENT AND PLAN:   Acute on chronic systolic heart failure with EF 25%: Dietary indiscretion, patient ate lot of salty food during Super Bowl time.  Patient is seen by CHF nurse, continue IV Lasix, Resume Entresto if the BP allows from tomorrow. #2 nonischemic cardiomyopathy3.  Hypokalemia:, Hypomagnesemia, #4 .  Atypical chest pain: Troponins have been negative, seen by cardiology #5 chronic atrial fibrillation: Rate controlled, patient is on low-dose Toprol-XL, continue Eliquis 5 mg p.o. twice daily, continue low-dose digoxin for rate control,  6.  Ascending thoracic aorta aneurysm, abdominal aortic aneurysm, seen by vascular surgery, patient is to follow-up as per surgery as an outpatient.  Right now ascending thoracic aorta aneurysm is 5 cm.  If the aneurysm is 6 cm in size patient needs to go to [facility] that time.  At this time patient needs close monitoring.  Patient is advised to quit smoking.   #Hypomagnesemia: Improving., hypokalemia: Improving.   All the records are reviewed and case discussed with Care Management/Social Workerr. Management plans discussed with the patient, family and they are in agreement.  CODE STATUS: Full TOTAL TIME TAKING CARE OF THIS PATIENT:35 minutes.   POSSIBLE D/C IN1-2 DAYS, DEPENDING ON CLINICAL CONDITION.   Katha Hamming M.D on 07/15/2017 at 6:11 PM  Between 7am to 6pm - Pager - [phone]  After 6pm go to [email or web address] - password [email or web address] [facility]  [place] [facility]  Office  [phone]  CC: Primary care physician; Dorcas Carrow, DO   Note: This dictation was prepared with Dragon dictation along with smaller phrase technology. Any transcriptional errors that result from this process are unintentional.

## 2017-07-16 DIAGNOSIS — R0789 Other chest pain: Secondary | ICD-10-CM | POA: Diagnosis not present

## 2017-07-16 LAB — CBC
HEMATOCRIT: 43.5 % (ref 40.0–52.0)
HEMOGLOBIN: 14.9 g/dL (ref 13.0–18.0)
MCH: 29 pg (ref 26.0–34.0)
MCHC: 34.3 g/dL (ref 32.0–36.0)
MCV: 84.5 fL (ref 80.0–100.0)
Platelets: 176 10*3/uL (ref 150–440)
RBC: 5.15 MIL/uL (ref 4.40–5.90)
RDW: 15.6 % — AB (ref 11.5–14.5)
WBC: 4.4 10*3/uL (ref 3.8–10.6)

## 2017-07-16 LAB — BASIC METABOLIC PANEL
Anion gap: 13 (ref 5–15)
BUN: 14 mg/dL (ref 6–20)
CO2: 30 mmol/L (ref 22–32)
CREATININE: 0.9 mg/dL (ref 0.61–1.24)
Calcium: 9.2 mg/dL (ref 8.9–10.3)
Chloride: 86 mmol/L — ABNORMAL LOW (ref 101–111)
GFR calc non Af Amer: >60 mL/min (ref >60)
GLUCOSE: 144 mg/dL — AB (ref 65–99)
Potassium: 3 mmol/L — ABNORMAL LOW (ref 3.5–5.1)
Sodium: 129 mmol/L — ABNORMAL LOW (ref 135–145)

## 2017-07-16 LAB — MAGNESIUM: Magnesium: 1.7 mg/dL (ref 1.7–2.4)

## 2017-07-16 MED ORDER — METOPROLOL SUCCINATE ER 25 MG PO TB24: 25.0000 mg | ORAL_TABLET | Freq: Every day | ORAL | 0 refills | Status: DC

## 2017-07-16 MED ORDER — FUROSEMIDE 40 MG PO TABS: 40.0000 mg | ORAL_TABLET | Freq: Every day | ORAL | 0 refills | Status: DC

## 2017-07-16 MED ORDER — POTASSIUM CHLORIDE 20 MEQ PO PACK
40.0000 meq | PACK | Freq: Once | ORAL | Status: AC
  Administered 2017-07-16: 40 meq via ORAL
  Filled 2017-07-16: qty 2

## 2017-07-16 NOTE — Discharge Summary (Signed)
[facility] - [place] at [facility]   PATIENT NAME: Eugene Henderson    MR#:  [id number]  DATE OF BIRTH:  06-26-1950  DATE OF ADMISSION:  07/14/2017 ADMITTING PHYSICIAN: Arnaldo Natal, MD  DATE OF DISCHARGE: No discharge date for patient encounter.  PRIMARY CARE PHYSICIAN: Olevia Perches P, DO    ADMISSION DIAGNOSIS:  Chest pain, unspecified type [R07.9]  DISCHARGE DIAGNOSIS:  Active Problems:   Chest pain   SECONDARY DIAGNOSIS:   Past Medical History:  Diagnosis Date  . Arthritis   . Asthma   . CHF (congestive heart failure) (HCC)    a. EF 25-30% by echo in 07/2016 with cath showing no significant CAD b. 01/2017: EF 30-35% with diffuse HK and moderate MR  . Dyspnea   . Hypertension   . Hyperthyroidism   . Noncompliance   . Persistent atrial fibrillation [facility])     HOSPITAL COURSE:  Acute on chronic systolic heart failure with EF 25% Resolved Due to dietary indiscretion Treated on our CHF protocol w/ IV Lasix, Entresto resumed on discharge  NICM, chronic Stable Cardiology did see pt while in house  Hypokalemia/Hypomagnesemia Repleted  Atypical chest pain Due to CHF Resolved Cardiology did see pt while in house-  No intervention indicated  chronic atrial fibrillation Stable on Toprol-XL, Eliquis, and low-dose digoxin   Ascending thoracic aorta aneurysm, abdominal aortic aneurysm Seen by vascular surgery, Patient is to follow-up as per surgery as an outpatient Right now ascending thoracic aorta aneurysm is 5 cm.  If the aneurysm is 6 cm in size patient needs to go to [facility] that time  Tobacco smoking Patient advised to quit smoking   DISCHARGE CONDITIONS:  On day of discharge patient is stable, afebrile, ready for discharge follow-up with primary care to 3 days reevaluation, for more details, see chart  CONSULTS OBTAINED:  Treatment Team:  Iran Ouch, MD Schnier, Latina Craver, MD  DRUG ALLERGIES:  No Known  Allergies  DISCHARGE MEDICATIONS:   Allergies as of 07/16/2017   No Known Allergies     Medication List    TAKE these medications   acetaminophen 500 MG tablet Commonly known as:  TYLENOL Take 500 mg by mouth every 6 (six) hours as needed.   albuterol 108 (90 Base) MCG/ACT inhaler Commonly known as:  PROVENTIL HFA;VENTOLIN HFA Inhale 2 puffs into the lungs every 6 (six) hours as needed for wheezing or shortness of breath.   apixaban 5 MG Tabs tablet Commonly known as:  ELIQUIS Take 1 tablet (5 mg total) by mouth 2 (two) times daily.   budesonide-formoterol 160-4.5 MCG/ACT inhaler Commonly known as:  SYMBICORT Inhale 2 puffs into the lungs 2 (two) times daily.   cyclobenzaprine 10 MG tablet Commonly known as:  FLEXERIL Take 1 tablet (10 mg total) by mouth at bedtime.   digoxin 0.125 MG tablet Commonly known as:  LANOXIN Take 1 tablet (0.125 mg total) by mouth daily.   ENTRESTO 24-26 MG Generic drug:  sacubitril-valsartan Take 1 tablet by mouth 2 (two) times daily.   feeding supplement Liqd Take 1 Container by mouth 2 (two) times daily between meals.   furosemide 40 MG tablet Commonly known as:  LASIX Take 1 tablet (40 mg total) by mouth daily. Start taking on:  07/17/2017 What changed:    when to take this  reasons to take this  additional instructions   gentamicin 0.3 % ophthalmic solution Commonly known as:  GARAMYCIN Place 1 drop into both eyes  every 4 (four) hours.   methimazole 10 MG tablet Commonly known as:  TAPAZOLE Take 2 tablets (20 mg total) by mouth 2 (two) times daily.   metoprolol succinate 25 MG 24 hr tablet Commonly known as:  TOPROL-XL Take 1 tablet (25 mg total) by mouth daily. Start taking on:  07/17/2017 What changed:    medication strength  how much to take  additional instructions   metroNIDAZOLE 500 MG tablet Commonly known as:  FLAGYL Take 1 tablet (500 mg total) by mouth 3 (three) times daily.   moxifloxacin 0.5 %  ophthalmic solution Commonly known as:  VIGAMOX Place 1 drop into the right eye as directed.   naphazoline-pheniramine 0.025-0.3 % ophthalmic solution Commonly known as:  NAPHCON-A Place 1 drop into both eyes 4 (four) times daily as needed for eye irritation.   potassium chloride SA 20 MEQ tablet Commonly known as:  K-DUR,KLOR-CON Take 1 tablet (20 mEq total) by mouth daily.   prednisoLONE acetate 1 % ophthalmic suspension Commonly known as:  PRED FORTE Place 1 drop into the right eye 4 (four) times daily.   spironolactone 25 MG tablet Commonly known as:  ALDACTONE Take 0.5 tablets (12.5 mg total) by mouth daily.   tobramycin 0.3 % ophthalmic ointment Commonly known as:  TOBREX Place into both eyes every 4 (four) hours while awake.        DISCHARGE INSTRUCTIONS:    If you experience worsening of your admission symptoms, develop shortness of breath, life threatening emergency, suicidal or homicidal thoughts you must seek medical attention immediately by calling 911 or calling your MD immediately  if symptoms less severe.  You Must read complete instructions/literature along with all the possible adverse reactions/side effects for all the Medicines you take and that have been prescribed to you. Take any new Medicines after you have completely understood and accept all the possible adverse reactions/side effects.   Please note  You were cared for by a hospitalist during your hospital stay. If you have any questions about your discharge medications or the care you received while you were in the hospital after you are discharged, you can call the unit and asked to speak with the hospitalist on call if the hospitalist that took care of you is not available. Once you are discharged, your primary care physician will handle any further medical issues. Please note that NO REFILLS for any discharge medications will be authorized once you are discharged, as it is imperative that you return  to your primary care physician (or establish a relationship with a primary care physician if you do not have one) for your aftercare needs so that they can reassess your need for medications and monitor your lab values.    Today   CHIEF COMPLAINT:   Chief Complaint  Patient presents with  . Chest Pain    HISTORY OF PRESENT ILLNESS:  Patient with past medical history of CHF, hypertension, atrial fibrillation and hypothyroidism presents to the emergency department complaining of chest pain.  Patient states his pain woke him from sleep and radiated slightly into his left shoulder/axilla.  The patient could not catch his breath which prompted him to seek evaluation in the emergency department.  Initial EKG and cardiac biomarkers were reassuring however the patient continued to have vague chest pain.  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  He did not have any hemoptysis.  Due to ongoing chest pain the emergency department staff  called the hospitalist service for admission.  VITAL SIGNS:  Blood pressure (!) 135/93, pulse 75, temperature 97.7 F (36.5 C), resp. rate 18, height 6' (1.829 m), weight 113.8 kg (250 lb 12.8 oz), SpO2 100 %.  I/O:    Intake/Output Summary (Last 24 hours) at 07/16/2017 1053 Last data filed at 07/16/2017 1002 Gross per 24 hour  Intake 720 ml  Output 3050 ml  Net -2330 ml    PHYSICAL EXAMINATION:  GENERAL:  67 y.o.-year-old patient lying in the bed with no acute distress.  EYES: Pupils equal, round, reactive to light and accommodation. No scleral icterus. Extraocular muscles intact.  HEENT: Head atraumatic, normocephalic. Oropharynx and nasopharynx clear.  NECK:  Supple, no jugular venous distention. No thyroid enlargement, no tenderness.  LUNGS: Normal breath sounds bilaterally, no wheezing, rales,rhonchi or crepitation. No use of accessory muscles of respiration.  CARDIOVASCULAR: S1, S2 normal. No murmurs, rubs, or  gallops.  ABDOMEN: Soft, non-tender, non-distended. Bowel sounds present. No organomegaly or mass.  EXTREMITIES: No pedal edema, cyanosis, or clubbing.  NEUROLOGIC: Cranial nerves II through XII are intact. Muscle strength 5/5 in all extremities. Sensation intact. Gait not checked.  PSYCHIATRIC: The patient is alert and oriented x 3.  SKIN: No obvious rash, lesion, or ulcer.   DATA REVIEW:   CBC Recent Labs  Lab 07/16/17 0627  WBC 4.4  HGB 14.9  HCT 43.5  PLT 176    Chemistries  Recent Labs  Lab 07/16/17 0627  NA 129*  K 3.0*  CL 86*  CO2 30  GLUCOSE 144*  BUN 14  CREATININE 0.90  CALCIUM 9.2  MG 1.7    Cardiac Enzymes Recent Labs  Lab 07/14/17 2109  TROPONINI <0.03    Microbiology Results  Results for orders placed or performed during the hospital encounter of 05/13/17  Culture, blood (routine x 2)     Status: None   Collection Time: 05/13/17 10:18 PM  Result Value Ref Range Status   Specimen Description BLOOD BLOOD RIGHT FOREARM  Final   Special Requests   Final    BOTTLES DRAWN AEROBIC AND ANAEROBIC Blood Culture adequate volume   Culture NO GROWTH 5 DAYS  Final   Report Status 05/18/2017 FINAL  Final  Culture, blood (routine x 2)     Status: None   Collection Time: 05/13/17 10:30 PM  Result Value Ref Range Status   Specimen Description BLOOD LEFT ANTECUBITAL  Final   Special Requests   Final    BOTTLES DRAWN AEROBIC AND ANAEROBIC Blood Culture adequate volume   Culture NO GROWTH 5 DAYS  Final   Report Status 05/18/2017 FINAL  Final    RADIOLOGY:  No results found.  EKG:   Orders placed or performed during the hospital encounter of 07/14/17  . ED EKG  . ED EKG  . EKG 12-Lead  . EKG 12-Lead  . ED EKG  . ED EKG  . EKG 12-Lead  . EKG 12-Lead  . EKG      Management plans discussed with the patient, family and they are in agreement.  CODE STATUS:     Code Status Orders  (From admission, onward)        Start     Ordered    07/14/17 0933  Full code  Continuous     07/14/17 0933    Code Status History    Date Active Date Inactive Code Status Order ID Comments User Context   05/13/2017 22:09 05/16/2017 18:47 Full Code [id number]  Henrene Dodge, MD ED   11/22/2016 21:10 11/26/2016 13:44 Full Code [id number]  Ihor Austin, MD ED   07/28/2016 13:55 08/04/2016 21:51 Full Code [id number]  Houston Siren, MD ED    Advance Directive Documentation     Most Recent Value  Type of Advance Directive  Healthcare Power of Attorney  Pre-existing out of facility DNR order (yellow form or pink MOST form)  No data  "MOST" Form in Place?  No data      TOTAL TIME TAKING CARE OF THIS PATIENT: 45 minutes.    Evelena Asa Vinaya Sancho M.D on 07/16/2017 at 10:53 AM  Between 7am to 6pm - Pager - [phone]  After 6pm go to [email or web address] - password [email or web address]  [facility] [place] [facility]  Office  [phone]  CC: Primary care physician; Dorcas Carrow, DO   Note: This dictation was prepared with Dragon dictation along with smaller phrase technology. Any transcriptional errors that result from this process are unintentional.

## 2017-07-16 NOTE — Progress Notes (Signed)
Patient stable for discharge.   PIV discontinued, catheter intact; site clean, dry, intact. Discharge instructions and follow-up reviewed. Patient verbalized understanding.   Family meeting patient at visitor's entrance to take patient home.

## 2017-07-18 ENCOUNTER — Telehealth: Payer: Self-pay

## 2017-07-18 NOTE — Telephone Encounter (Signed)
I have made the 1st attempt to contact the patient or family member in charge, in order to follow up from recently being discharged from the hospital. I left a message on voicemail but I will make another attempt at a different time.   Direct call back - [phone] 

## 2017-07-20 NOTE — Progress Notes (Deleted)
Patient ID: Eugene Henderson, male    DOB: Aug 04, 1950, 67 y.o.   MRN: [id number]  HPI  [name] is a 67 y/o male with a history of atrial fibrillation, hyperthyroidism, HTN, asthma, arthritis, current tobacco use and chronic heart failure.   Last echo done 02/02/17 was 30-35%. Echo done 07/29/16 and showed an EF of 25-30% along with moderate MR and moderately increased PA pressure of 50 mm Hg. 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   Admitted 07/14/17 due to HF exacerbation due to dietary indiscretion. Cardiology consult obtained. Initially needed IV diuretics and then transitioned to oral diuretics. Discharged after 2 days. Admitted 06/24/16 due to corneal ulcer and possible secondary endophthalmitis. Hospital stay was complicated by hypokalemia and hyponatremia secondary to volume depletion. Ophthalmology consult obtained. Antibiotics were given. Discharged after 6 days.  Admitted 05/13/17 due to acute diverticulitis. Initially needed IV antibiotics and then transitioned to oral medications. Discharged after 3 days. Was in the ED 05/06/17 due to right eye bacterial conjunctivitis. Was given eye drops and released.   He presents today for his follow-up appointment with a chief complaint of   Past Medical History:  Diagnosis Date  . Arthritis   . Asthma   . CHF (congestive heart failure) (HCC)    a. EF 25-30% by echo in 07/2016 with cath showing no significant CAD b. 01/2017: EF 30-35% with diffuse HK and moderate MR  . Dyspnea   . Hypertension   . Hyperthyroidism   . Noncompliance   . Persistent atrial fibrillation [facility])    Past Surgical History:  Procedure Laterality Date  . JOINT REPLACEMENT Right   . RIGHT/LEFT HEART CATH AND CORONARY ANGIOGRAPHY N/A 08/02/2016   Procedure: Right/Left Heart Cath and Coronary Angiography;  Surgeon: Iran Ouch, MD;  Location: [facility] INVASIVE CV LAB;  Service:  Cardiovascular;  Laterality: N/A;  . TESTICLE SURGERY     Patient states that he had to have the tube fixed.   Family History  Problem Relation Age of Onset  . Diabetes Mother   . Diabetes Father   . Heart disease Brother   . Heart attack Brother   . Diabetes Brother   . Kidney failure Brother    Social History   Tobacco Use  . Smoking status: Current Some Day Smoker    Packs/day: 0.25    Years: 25.00    Pack years: 6.25    Types: Cigarettes  . Smokeless tobacco: Never Used  Substance Use Topics  . Alcohol use: Yes    Comment: Socially - once a month   No Known Allergies    Review of Systems  Constitutional: Negative for appetite change and fatigue.  HENT: Negative for congestion, postnasal drip and sore throat.   Eyes: Positive for pain (right eye), discharge (especially in the morning) and visual disturbance (blurry vision in right eye).  Respiratory: Positive for chest tightness (3 days ago) and shortness of breath (when getting up steps). Negative for cough and wheezing.   Cardiovascular: Negative for chest pain, palpitations and leg swelling.  Gastrointestinal: Negative for abdominal distention and abdominal pain.  Endocrine: Negative.   Genitourinary: Negative.   Musculoskeletal: Positive for arthralgias (knee pain) and back pain.  Skin: Negative.   Allergic/Immunologic: Negative.   Neurological: Positive for dizziness. Negative for weakness and light-headedness.  Hematological: Negative for adenopathy. Does not bruise/bleed easily.  Psychiatric/Behavioral: Positive for sleep disturbance (sleep more during the day and watch  tv at night). Negative for dysphoric mood. The patient is not nervous/anxious.      Physical Exam  Constitutional: He is oriented to person, place, and time. He appears well-developed and well-nourished.  HENT:  Head: Normocephalic and atraumatic.  Eyes: Right eye exhibits discharge. Right conjunctiva is injected. Right eye exhibits  abnormal extraocular motion.  Right eye iris is clouded over  Neck: Normal range of motion. Neck supple. No JVD present.  Cardiovascular: Normal rate. An irregular rhythm present.  Pulmonary/Chest: Effort normal. He has no wheezes. He has no rales.  Abdominal: Soft. He exhibits no distension. There is no tenderness.  Musculoskeletal: He exhibits no edema or tenderness.  Neurological: He is alert and oriented to person, place, and time.  Skin: Skin is warm and dry.  Psychiatric: He has a normal mood and affect. His behavior is normal. Thought content normal.  Nursing note and vitals reviewed.  Assessment & Plan:  1: Chronic heart failure with reduced ejection fraction- - NYHA class II - euvolemic today - down another 4 pounds; admits to not weighing daily at home because of his vision and he was instructed to call for an overnight weight gain of >2 pounds or a weekly weight gain of >5 pounds when he does weigh - still using salt on some foods;  Instructed to not add any salt to food. Reminded him to follow a 2000mg  sodium diet.   - BNP from 07/14/17 was 38.0 - saw cardiologist Kirke Corin) 11/02/16 - PharmD reviewed medications with the patient  2: HTN- - BP initially elevated but improved upon recheck - has been out of spironolactone/potassium for the last month; says that his potassium level dropped during his most recent hospitalization. Prescriptions sent in with instructions to have labs rechecked in 2 weeks at his PCP's office - Endorses having bad HA's when blood pressure elevated  - saw PCP Laural Benes) on 05/09/17 - BMP from 07/16/17 reviewed and showed sodium 129, potassium 3.0 and GFR >60  3: Atrial fibrillation- - currently on apixaban, digoxin and max metoprolol XL - last dig level on 07/14/17 was 0.4  4. Keratitis- - had recent hospital stay for right eye keratitis - is supposed to be on 3 different eye drops along with valtrex as he says the providers think the chicken pox virus  got into his eye - he hasn't been able to start the medications due to cost and his daughter has been trying to find someplace that will provide them - explained the seriousness of how his eye looks and my concern for complete and permanent vision loss if this isn't treated. Instructed him to go to the ER for treatment if he can't get in to see an opthalmologist. Patient says that he'll see about returning to [facility] ER where he was recently treated for this.   5: Tobacco- - currently smoking tobacco - complete cessation discussed for 3 minutes with him  Patient did not bring his medications nor a list. Each medication was verbally reviewed with the patient and he was encouraged to bring the bottles to every visit to confirm accuracy of list.

## 2017-07-21 ENCOUNTER — Ambulatory Visit: Payer: Medicare (Managed Care) | Admitting: Family

## 2017-07-21 ENCOUNTER — Telehealth: Payer: Self-pay | Admitting: Family

## 2017-07-21 NOTE — Telephone Encounter (Signed)
Patient did not show for his Heart Failure Clinic appointment on 07/21/17. Will attempt to reschedule.

## 2017-08-22 ENCOUNTER — Ambulatory Visit (INDEPENDENT_AMBULATORY_CARE_PROVIDER_SITE_OTHER): Payer: Medicare Other | Admitting: Vascular Surgery

## 2017-08-22 ENCOUNTER — Other Ambulatory Visit (INDEPENDENT_AMBULATORY_CARE_PROVIDER_SITE_OTHER): Payer: Medicare Other

## 2017-09-01 ENCOUNTER — Ambulatory Visit: Payer: Medicaid Other | Admitting: Cardiovascular Disease

## 2017-09-01 NOTE — Progress Notes (Deleted)
Cardiology Office Note   Date:  09/01/2017   ID:  Eugene Henderson, DOB [birth year], MRN [id number]  PCP:  Dorcas Carrow, DO  Cardiologist:   Lorine Bears, MD   No chief complaint on file.     History of Present Illness: Eugene Henderson is a 67 y.o. male who presents for a follow-up visit regarding chronic systolic heart failure due to nonischemic cardiomyopathy and atrial fibrillation. The patient was hospitalized in February with acute systolic heart failure in the setting of atrial fibrillation with rapid ventricular response. He is known to have history of hyperthyroidism and was not taking treatment for that due to financial reasons. He had an echocardiogram done which showed an ejection fraction of 25-30%, moderate mitral regurgitation and moderate pulmonary hypertension. I performed cardiac catheterization which showed no significant coronary artery disease. He was started on medical therapy for heart failure and anticoagulation for atrial fibrillation with Eliquis.   He had issues with obtaining medications initially but was able to get Medicaid and has been taking his medications regularly. He has been following with in the heart failure clinic. He was seen today and the plans are to start him on Entresto. He is feeling better overall. He gained weight since March but he attributes that to poor diet choices. He continues to be in atrial fibrillation with reasonably controlled ventricular rate. He denies any chest pain. He reports improvement in exertional dyspnea and orthopnea.   Past Medical History:  Diagnosis Date  . Arthritis   . Asthma   . CHF (congestive heart failure) (HCC)    a. EF 25-30% by echo in 07/2016 with cath showing no significant CAD b. 01/2017: EF 30-35% with diffuse HK and moderate MR  . Dyspnea   . Hypertension   . Hyperthyroidism   . Noncompliance   . Persistent atrial fibrillation [facility])     Past Surgical History:  Procedure Laterality Date  .  JOINT REPLACEMENT Right   . RIGHT/LEFT HEART CATH AND CORONARY ANGIOGRAPHY N/A 08/02/2016   Procedure: Right/Left Heart Cath and Coronary Angiography;  Surgeon: Iran Ouch, MD;  Location: [facility] INVASIVE CV LAB;  Service: Cardiovascular;  Laterality: N/A;  . TESTICLE SURGERY     Patient states that he had to have the tube fixed.     Current Outpatient Medications  Medication Sig Dispense Refill  . acetaminophen (TYLENOL) 500 MG tablet Take 500 mg by mouth every 6 (six) hours as needed.    Marland Kitchen albuterol (PROVENTIL HFA;VENTOLIN HFA) 108 (90 Base) MCG/ACT inhaler Inhale 2 puffs into the lungs every 6 (six) hours as needed for wheezing or shortness of breath. 1 Inhaler 3  . apixaban (ELIQUIS) 5 MG TABS tablet Take 1 tablet (5 mg total) by mouth 2 (two) times daily. 180 tablet 1  . budesonide-formoterol (SYMBICORT) 160-4.5 MCG/ACT inhaler Inhale 2 puffs into the lungs 2 (two) times daily. 3 Inhaler 3  . cyclobenzaprine (FLEXERIL) 10 MG tablet Take 1 tablet (10 mg total) by mouth at bedtime. (Patient not taking: Reported on 07/14/2017) 30 tablet 0  . digoxin (LANOXIN) 0.125 MG tablet Take 1 tablet (0.125 mg total) by mouth daily. 90 tablet 1  . feeding supplement (BOOST / RESOURCE BREEZE) LIQD Take 1 Container by mouth 2 (two) times daily between meals. 60 Container 2  . furosemide (LASIX) 40 MG tablet Take 1 tablet (40 mg total) by mouth daily. 30 tablet 0  . gentamicin (GARAMYCIN) 0.3 % ophthalmic solution Place 1 drop into both eyes  every 4 (four) hours. (Patient not taking: Reported on 07/05/2017) 5 mL 0  . methimazole (TAPAZOLE) 10 MG tablet Take 2 tablets (20 mg total) by mouth 2 (two) times daily. 180 tablet 1  . metoprolol succinate (TOPROL-XL) 25 MG 24 hr tablet Take 1 tablet (25 mg total) by mouth daily. 90 tablet 0  . metroNIDAZOLE (FLAGYL) 500 MG tablet Take 1 tablet (500 mg total) by mouth 3 (three) times daily. (Patient not taking: Reported on 07/05/2017) 30 tablet 1  . moxifloxacin  (VIGAMOX) 0.5 % ophthalmic solution Place 1 drop into the right eye as directed.    . naphazoline-pheniramine (NAPHCON-A) 0.025-0.3 % ophthalmic solution Place 1 drop into both eyes 4 (four) times daily as needed for eye irritation. 15 mL 0  . potassium chloride SA (K-DUR,KLOR-CON) 20 MEQ tablet Take 1 tablet (20 mEq total) by mouth daily. 90 tablet 3  . prednisoLONE acetate (PRED FORTE) 1 % ophthalmic suspension Place 1 drop into the right eye 4 (four) times daily.    . sacubitril-valsartan (ENTRESTO) 24-26 MG Take 1 tablet by mouth 2 (two) times daily.    Marland Kitchen spironolactone (ALDACTONE) 25 MG tablet Take 0.5 tablets (12.5 mg total) by mouth daily. 45 tablet 5  . tobramycin (TOBREX) 0.3 % ophthalmic ointment Place into both eyes every 4 (four) hours while awake. (Patient not taking: Reported on 07/05/2017) 3.5 g 0   No current facility-administered medications for this visit.     Allergies:   Patient has no known allergies.    Social History:  The patient  reports that he has been smoking cigarettes.  He has a 6.25 pack-year smoking history. He has never used smokeless tobacco. He reports that he drinks alcohol. He reports that he does not use drugs.   Family History:  The patient's family history includes Diabetes in his brother, father, and mother; Heart attack in his brother; Heart disease in his brother; Kidney failure in his brother.    ROS:  Please see the history of present illness.   Otherwise, review of systems are positive for none.   All other systems are reviewed and negative.    PHYSICAL EXAM: VS:  There were no vitals taken for this visit. , BMI There is no height or weight on file to calculate BMI. GEN: Well nourished, well developed, in no acute distress  HEENT: normal  Neck: no JVD, carotid bruits, or masses Cardiac: Irregularly irregular; no murmurs, rubs, or gallops,no edema  Respiratory:  clear to auscultation bilaterally, normal work of breathing GI: soft, nontender,  nondistended, + BS MS: no deformity or atrophy  Skin: warm and dry, no rash Neuro:  Strength and sensation are intact Psych: euthymic mood, full affect   EKG:  EKG is ordered today. The ekg ordered today demonstrates  atrial fibrillation with PVCs and nonspecific T wave changes. Ventricular rate is 97 bpm.   Recent Labs: 05/14/2017: ALT 45 07/14/2017: B Natriuretic Peptide 38.0; TSH 0.363 07/16/2017: BUN 14; Creatinine, Ser 0.90; Hemoglobin 14.9; Magnesium 1.7; Platelets 176; Potassium 3.0; Sodium 129    Lipid Panel    Component Value Date/Time   CHOL 134 05/09/2017 1143   TRIG 80 05/09/2017 1143   HDL 67 05/09/2017 1143   LDLCALC 51 05/09/2017 1143      Wt Readings from Last 3 Encounters:  07/16/17 250 lb 12.8 oz (113.8 kg)  07/05/17 257 lb 8 oz (116.8 kg)  06/15/17 261 lb (118.4 kg)      PAD Screen 08/30/2016  Previous PAD dx? No  Previous surgical procedure? No  Pain with walking? Yes  Subsides with rest? Yes  Feet/toe relief with dangling? Yes  Painful, non-healing ulcers? No  Extremities discolored? No      ASSESSMENT AND PLAN:  1.  Chronic systolic heart failure: Ejection fraction was severely reduced. His cardiomyopathy is likely tachycardia-induced in the setting of atrial fibrillation and hyperthyroidism. He has improved symptomatically and has been more compliant with his medications. He appears to be euvolemic on current dose of furosemide. He is otherwise on optimal medical therapy and was just started on Entresto by the heart failure clinic. I'm going to repeat his echocardiogram in 2 months from now to reevaluate his EF on optimal medical therapy.  2. Essential hypertension: Blood pressure is mildly elevated but should improve with the addition of Entresto.   3. Persistent atrial fibrillation:  I discussed with him the option of proceeding with cardioversion now that he has been anticoagulated effectively. He is not interested in proceeding with this. He  reports improvement in symptoms.  Disposition:   FU with me in 4 months  Signed,  Lorine Bears, MD  09/01/2017 8:15 AM    [facility]

## 2017-09-02 ENCOUNTER — Encounter: Payer: Self-pay | Admitting: Cardiovascular Disease

## 2017-09-08 ENCOUNTER — Encounter: Payer: Self-pay | Admitting: Emergency Medicine

## 2017-09-08 ENCOUNTER — Other Ambulatory Visit: Payer: Self-pay

## 2017-09-08 ENCOUNTER — Emergency Department
Admission: EM | Admit: 2017-09-08 | Discharge: 2017-09-09 | Disposition: A | Discharge status: Home / Self Care | Payer: Medicare HMO | Attending: Emergency Medicine | Admitting: Emergency Medicine

## 2017-09-08 ENCOUNTER — Emergency Department: Payer: Medicare HMO

## 2017-09-08 DIAGNOSIS — R0602 Shortness of breath: Secondary | ICD-10-CM | POA: Diagnosis not present

## 2017-09-08 DIAGNOSIS — R0789 Other chest pain: Secondary | ICD-10-CM | POA: Diagnosis not present

## 2017-09-08 DIAGNOSIS — F17210 Nicotine dependence, cigarettes, uncomplicated: Secondary | ICD-10-CM | POA: Diagnosis not present

## 2017-09-08 DIAGNOSIS — J45909 Unspecified asthma, uncomplicated: Secondary | ICD-10-CM | POA: Diagnosis not present

## 2017-09-08 DIAGNOSIS — Z9660 Presence of unspecified orthopedic joint implant: Secondary | ICD-10-CM | POA: Diagnosis not present

## 2017-09-08 DIAGNOSIS — R079 Chest pain, unspecified: Secondary | ICD-10-CM | POA: Diagnosis not present

## 2017-09-08 DIAGNOSIS — I110 Hypertensive heart disease with heart failure: Secondary | ICD-10-CM | POA: Insufficient documentation

## 2017-09-08 DIAGNOSIS — H5711 Ocular pain, right eye: Secondary | ICD-10-CM | POA: Diagnosis not present

## 2017-09-08 DIAGNOSIS — Z79899 Other long term (current) drug therapy: Secondary | ICD-10-CM | POA: Insufficient documentation

## 2017-09-08 DIAGNOSIS — Z7901 Long term (current) use of anticoagulants: Secondary | ICD-10-CM | POA: Diagnosis not present

## 2017-09-08 DIAGNOSIS — I5022 Chronic systolic (congestive) heart failure: Secondary | ICD-10-CM | POA: Diagnosis not present

## 2017-09-08 LAB — CBC
HCT: 41 % (ref 40.0–52.0)
HEMOGLOBIN: 13.8 g/dL (ref 13.0–18.0)
MCH: 29.2 pg (ref 26.0–34.0)
MCHC: 33.7 g/dL (ref 32.0–36.0)
MCV: 86.7 fL (ref 80.0–100.0)
PLATELETS: 197 10*3/uL (ref 150–440)
RBC: 4.72 MIL/uL (ref 4.40–5.90)
RDW: 18 % — ABNORMAL HIGH (ref 11.5–14.5)
WBC: 4.2 10*3/uL (ref 3.8–10.6)

## 2017-09-08 LAB — BASIC METABOLIC PANEL
ANION GAP: 8 (ref 5–15)
BUN: 11 mg/dL (ref 6–20)
CHLORIDE: 99 mmol/L — AB (ref 101–111)
CO2: 27 mmol/L (ref 22–32)
Calcium: 8.3 mg/dL — ABNORMAL LOW (ref 8.9–10.3)
Creatinine, Ser: 0.99 mg/dL (ref 0.61–1.24)
GFR calc non Af Amer: >60 mL/min (ref >60)
Glucose, Bld: 138 mg/dL — ABNORMAL HIGH (ref 65–99)
Potassium: 3.4 mmol/L — ABNORMAL LOW (ref 3.5–5.1)
SODIUM: 134 mmol/L — AB (ref 135–145)

## 2017-09-08 LAB — TROPONIN I

## 2017-09-08 MED ORDER — IOHEXOL 350 MG/ML SOLN
75.0000 mL | Freq: Once | INTRAVENOUS | Status: AC | PRN
  Administered 2017-09-08: 75 mL via INTRAVENOUS

## 2017-09-08 MED ORDER — SODIUM CHLORIDE 0.9 % IV BOLUS
500.0000 mL | INTRAVENOUS | Status: AC
  Administered 2017-09-08: 500 mL via INTRAVENOUS

## 2017-09-08 MED ORDER — ASPIRIN 81 MG PO CHEW
324.0000 mg | CHEWABLE_TABLET | Freq: Once | ORAL | Status: AC
  Administered 2017-09-08: 324 mg via ORAL
  Filled 2017-09-08: qty 4

## 2017-09-08 NOTE — ED Triage Notes (Signed)
Patient from home via [facility] reporting chest pain that started approximately 3 hours ago. Patient reports pressure to middle of chest radiating to right side. Also reports shortness of breath. Denies N/V. History of Afib and similar episodes of chest pain. Patient in NAD upon arrival. Even, unlabored respirations noted. Alert and oriented x4.

## 2017-09-08 NOTE — ED Provider Notes (Signed)
[facility] Emergency Department Provider Note  ____________________________________________   First MD Initiated Contact with Patient 09/08/17 2258     (approximate)  I have reviewed the triage vital signs and the nursing notes.   HISTORY  Chief Complaint Chest Pain    HPI Eugene Henderson is a 67 y.o. male with medical history as listed below which most notably includes chronic atrial fibrillation and he is reportedly taking Eliquis according to records as recently as February but he does not know any of his medications.  He does have a documented history of medication noncompliance.  He presents by EMS for evaluation of acute onset chest pain that started about 3 hours prior to arrival.  He states he was watching TV at the time.  He is not particularly ambulatory and although he lives upstairs he tends to stay up stairs whenever possible.  He states that tonight the pain started at rest but is worse with exertion, with such as when he walks to the restroom or lifts himself up off the toilet.  It is accompanied with a sensation of shortness of breath.  The pain is in the middle of his chest and radiates to the right side and is both sharp and feels like pressure.  Nothing in particular makes it better except that the symptoms are less notable when he is lying down.  He denies recent illnesses and specifically denies fever/chills, nausea, vomiting, abdominal pain, and dysuria.  He smokes cigarettes and has a history of hypertension.  He denies hyperlipidemia and reports that he has not ever had a heart attack in the past.  The last time he had chest pain was in February when he was admitted and had further evaluation by cardiology (Dr. Mariah Milling and his team) and had an echocardiogram.  I do not see a report of any catheterization.  He thinks he is taking his medications because his daughter gives them to him but he cannot be certain about any of them.  He does say that his  daughter takes good care of him.  He is chest pain-free at this time and states that he did not even want to come in but his daughter insisted that he do so.  Of note the patient is also complaining of pain in his right eye which is been present for several months.  He states that he went to an ophthalmologist once who started him on some drops but they ran out after about 20 days.  He states he has had trouble with his insurance and he has not been able to get back to the ophthalmologist.  He says that his eye stings and burns all the time and his vision is decreased out of it.  He would like to go back to see an ophthalmologist.  He brought up the issue by asking me if I could write him for some antibiotics.   Past Medical History:  Diagnosis Date  . Arthritis   . Asthma   . CHF (congestive heart failure) (HCC)    a. EF 25-30% by echo in 07/2016 with cath showing no significant CAD b. 01/2017: EF 30-35% with diffuse HK and moderate MR  . Dyspnea   . Hypertension   . Hyperthyroidism   . Noncompliance   . Persistent atrial fibrillation [facility])     Patient Active Problem List   Diagnosis Date Noted  . Chest pain 07/14/2017  . 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 Diverticulitis   .  Diverticulitis of large intestine with perforation without abscess or bleeding 05/13/2017  . COPD (chronic obstructive pulmonary disease) (HCC) 02/03/2017  . 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) 12/15/2016  . AAA (abdominal aortic aneurysm) (HCC) 11/29/2016  . Chronic low back pain 11/29/2016  . Alcohol abuse 11/29/2016  . Hyponatremia 11/22/2016  . Chronic systolic heart failure (HCC) 08/19/2016  . Tobacco use 08/19/2016  . Snoring 08/19/2016  . Persistent atrial fibrillation (HCC) 07/28/2016  . Hyperthyroidism 07/28/2016  . Noncompliance with medications 07/28/2016  . Essential hypertension 07/28/2016    Past Surgical History:  Procedure Laterality Date  . JOINT REPLACEMENT Right   .  RIGHT/LEFT HEART CATH AND CORONARY ANGIOGRAPHY N/A 08/02/2016   Procedure: Right/Left Heart Cath and Coronary Angiography;  Surgeon: Iran Ouch, MD;  Location: [facility] INVASIVE CV LAB;  Service: Cardiovascular;  Laterality: N/A;  . TESTICLE SURGERY     Patient states that he had to have the tube fixed.    Prior to Admission medications   Medication Sig Start Date End Date Taking? Authorizing Provider  acetaminophen (TYLENOL) 500 MG tablet Take 500 mg by mouth every 6 (six) hours as needed.    [provider]  albuterol (PROVENTIL HFA;VENTOLIN HFA) 108 (90 Base) MCG/ACT inhaler Inhale 2 puffs into the lungs every 6 (six) hours as needed for wheezing or shortness of breath. 07/05/17   Delma Freeze, FNP  apixaban (ELIQUIS) 5 MG TABS tablet Take 1 tablet (5 mg total) by mouth 2 (two) times daily. 05/09/17   Greear, Megan P, DO  budesonide-formoterol (SYMBICORT) 160-4.5 MCG/ACT inhaler Inhale 2 puffs into the lungs 2 (two) times daily. 07/05/17   Delma Freeze, FNP  cyclobenzaprine (FLEXERIL) 10 MG tablet Take 1 tablet (10 mg total) by mouth at bedtime. Patient not taking: Reported on 07/14/2017 05/18/17   Olevia Perches P, DO  digoxin (LANOXIN) 0.125 MG tablet Take 1 tablet (0.125 mg total) by mouth daily. 05/09/17   Lowy, Megan P, DO  feeding supplement (BOOST / RESOURCE BREEZE) LIQD Take 1 Container by mouth 2 (two) times daily between meals. 11/26/16   Delfino Lovett, MD  furosemide (LASIX) 40 MG tablet Take 1 tablet (40 mg total) by mouth daily. 07/17/17   Salary, Evelena Asa, MD  gentamicin (GARAMYCIN) 0.3 % ophthalmic solution Place 1 drop into both eyes every 4 (four) hours. Patient not taking: Reported on 07/05/2017 05/06/17   Joni Reining, PA-C  methimazole (TAPAZOLE) 10 MG tablet Take 2 tablets (20 mg total) by mouth 2 (two) times daily. 05/09/17   Claw, Megan P, DO  metoprolol succinate (TOPROL-XL) 25 MG 24 hr tablet Take 1 tablet (25 mg total) by mouth daily. 07/17/17   Salary,  Evelena Asa, MD  metroNIDAZOLE (FLAGYL) 500 MG tablet Take 1 tablet (500 mg total) by mouth 3 (three) times daily. Patient not taking: Reported on 07/05/2017 05/16/17   Lattie Haw, MD  moxifloxacin (VIGAMOX) 0.5 % ophthalmic solution Place 1 drop into the right eye as directed.    [provider]  naphazoline-pheniramine (NAPHCON-A) 0.025-0.3 % ophthalmic solution Place 1 drop into both eyes 4 (four) times daily as needed for eye irritation. 05/06/17   Joni Reining, PA-C  potassium chloride SA (K-DUR,KLOR-CON) 20 MEQ tablet Take 1 tablet (20 mEq total) by mouth daily. 07/05/17   Delma Freeze, FNP  prednisoLONE acetate (PRED FORTE) 1 % ophthalmic suspension Place 1 drop into the right eye 4 (four) times daily.    [provider]  sacubitril-valsartan (ENTRESTO) 24-26 MG Take 1 tablet by mouth 2 (two) times daily.    [provider]  spironolactone (ALDACTONE) 25 MG tablet Take 0.5 tablets (12.5 mg total) by mouth daily. 07/05/17   Delma Freeze, FNP  tobramycin (TOBREX) 0.3 % ophthalmic ointment Place into both eyes every 4 (four) hours while awake. Patient not taking: Reported on 07/05/2017 05/16/17   Lattie Haw, MD    Allergies Patient has no known allergies.  Family History  Problem Relation Age of Onset  . Diabetes Mother   . Diabetes Father   . Heart disease Brother   . Heart attack Brother   . Diabetes Brother   . Kidney failure Brother     Social History Social History   Tobacco Use  . Smoking status: Current Some Day Smoker    Packs/day: 0.25    Years: 25.00    Pack years: 6.25    Types: Cigarettes  . Smokeless tobacco: Never Used  Substance Use Topics  . Alcohol use: Yes    Comment: Socially - once a month  . Drug use: No    Review of Systems Constitutional: No fever/chills Eyes: Pain and decreased vision in the right eye for several months, previously treated by ophthalmologist but lost to follow-up ENT: No sore  throat. Cardiovascular: Chest pain as described above Respiratory: Shortness of breath associated with chest pain as described above Gastrointestinal: No abdominal pain.  No nausea, no vomiting.  No diarrhea.  No constipation. Genitourinary: Negative for dysuria. Musculoskeletal: Negative for neck pain.  Negative for back pain. Integumentary: Negative for rash. Neurological: Negative for headaches, focal weakness or numbness.   ____________________________________________   PHYSICAL EXAM:  VITAL SIGNS: ED Triage Vitals  Enc Vitals Group     BP 09/08/17 2150 132/80     Pulse Rate 09/08/17 2150 84     Resp 09/08/17 2150 14     Temp --      Temp src --      SpO2 09/08/17 2146 100 %     Weight 09/08/17 2150 129.3 kg (285 lb)     Height 09/08/17 2150 1.829 m (6')     Head Circumference --      Peak Flow --      Pain Score 09/08/17 2149 9     Pain Loc --      Pain Edu? --      Excl. in GC? --     Constitutional: Alert and oriented. Well appearing and in no acute distress. Eyes: Left eye is normal in appearance.  His right eye is cloudy and opaque including the entire iris and injection in the conjunctiva with a central lesion over the cornea and which appears to also cover the pupil.  Highly abnormal appearance but also appears nonacute.    Head: Atraumatic. Nose: No congestion/rhinnorhea. Mouth/Throat: Mucous membranes are moist. Neck: No stridor.  No meningeal signs.   Cardiovascular: Normal rate, regular rhythm. Good peripheral circulation. Grossly normal heart sounds.  Some reproducible anterior chest wall tenderness to palpation Respiratory: Normal respiratory effort.  No retractions. Lungs CTAB. Gastrointestinal: Soft and nontender. No distention.  Musculoskeletal: No lower extremity tenderness nor edema. No gross deformities of extremities. Neurologic:  Normal speech and language. No gross focal neurologic deficits are appreciated.  Skin:  Skin is warm, dry and  intact. No rash noted. Psychiatric: Mood and affect are normal. Speech and behavior are normal.  ____________________________________________   LABS (all labs ordered are  listed, but only abnormal results are displayed)  Labs Reviewed  BASIC METABOLIC PANEL - Abnormal; Notable for the following components:      Result Value   Sodium 134 (*)    Potassium 3.4 (*)    Chloride 99 (*)    Glucose, Bld 138 (*)    Calcium 8.3 (*)    All other components within normal limits  CBC - Abnormal; Notable for the following components:   RDW 18.0 (*)    All other components within normal limits  TROPONIN I  TROPONIN I   ____________________________________________  EKG  ED ECG REPORT I, Loleta Rose, the attending physician, personally viewed and interpreted this ECG.  Date: 09/08/2017 EKG Time: 21:49 Rate: 83 Rhythm: atrial fibrillation QRS Axis: normal Intervals: normal ST/T Wave abnormalities: Non-specific ST segment / T-wave changes, but no evidence of acute ischemia. Narrative Interpretation: no evidence of acute ischemia.  When compared to prior EKG in February, there is no significant change.   ____________________________________________  RADIOLOGY I, Loleta Rose, personally viewed and evaluated these images (plain radiographs) as part of my medical decision making, as well as reviewing the written report by the radiologist.  ED MD interpretation: No acute findings on chest x-ray.  Official radiology report(s): Dg Chest 2 View  Result Date: 09/08/2017 CLINICAL DATA:  Chest pain starting about 3 hours ago. Chest pressure radiating to the right side. Shortness of breath. EXAM: CHEST - 2 VIEW COMPARISON:  07/14/2017 FINDINGS: Normal heart size and pulmonary vascularity. No focal airspace disease or consolidation in the lungs. No blunting of costophrenic angles. No pneumothorax. Mediastinal contours appear intact. Degenerative changes in the spine. Tortuous aorta. IMPRESSION: No  active cardiopulmonary disease. Electronically Signed   By: Burman Nieves M.D.   On: 09/08/2017 22:21   Ct Angio Chest Pe W/cm &/or Wo Cm  Result Date: 09/08/2017 CLINICAL DATA:  Chest pain starting 3 hours ago and radiating to the right side. Shortness of breath. EXAM: CT ANGIOGRAPHY CHEST WITH CONTRAST TECHNIQUE: Multidetector CT imaging of the chest was performed using the standard protocol during bolus administration of intravenous contrast. Multiplanar CT image reconstructions and MIPs were obtained to evaluate the vascular anatomy. CONTRAST:  75mL OMNIPAQUE IOHEXOL 350 MG/ML SOLN COMPARISON:  07/14/2017 FINDINGS: Cardiovascular: Good opacification of the central and segmental pulmonary arteries. No focal filling defects. No evidence of significant pulmonary embolus. Mild cardiac enlargement. Ascending thoracic aortic aneurysm measuring 5.1 cm diameter. No change. Scattered aortic calcifications. Coronary artery calcifications. Mediastinum/Nodes: Esophagus is decompressed. Scattered lymph nodes in the mediastinum are not pathologically enlarged. Calcified lymph nodes are present consistent with postinflammatory process. Thyroid gland is unremarkable. Lungs/Pleura: Lungs are clear. No airspace disease or consolidation. No pleural effusions. No pneumothorax. Airways are patent. Upper Abdomen: No acute abnormality. Musculoskeletal: No chest wall abnormality. No acute or significant osseous findings. Review of the MIP images confirms the above findings. IMPRESSION: 1. No evidence of significant pulmonary embolus. 2. Cardiac enlargement. 3. Ascending aortic aneurysm measuring 5.1 cm diameter.  No change. 4. No evidence of active pulmonary disease. Aortic Atherosclerosis (ICD10-I70.0). Electronically Signed   By: Burman Nieves M.D.   On: 09/08/2017 23:52    ____________________________________________   PROCEDURES  Critical Care performed: No   Procedure(s) performed:    Procedures   ____________________________________________   INITIAL IMPRESSION / ASSESSMENT AND PLAN / ED COURSE  As part of my medical decision making, I reviewed the following data within the electronic MEDICAL RECORD NUMBER Nursing notes reviewed and incorporated, Labs  reviewed , EKG interpreted , Old EKG reviewed, Old chart reviewed and Notes from prior ED visits    Differential diagnosis includes, but is not limited to, ACS, aortic dissection, pulmonary embolism, cardiac tamponade, pneumothorax, pneumonia, pericarditis, myocarditis, GI-related causes including esophagitis/gastritis, and musculoskeletal chest wall pain.    While angina/ACS (cardiac causes) are at the top of my differential, we must seriously consider pulmonary embolism for this patient with chronic atrial fibrillation.  He has a history of medication noncompliance and is not certain about any of his medications.  He is relatively sedentary and has shortness of breath accompanied with chest pain.  Even if he is taking his Eliquis he could still develop a pulmonary embolism and I believe him to be high risk.  Given his age and comorbidities a d-dimer will be nondiagnostic.  His lab work is all reassuring including his metabolic panel and kidney function, he has no leukocytosis, and he has a normal-appearing chest x-ray.  EKG is unchanged from prior  The patient does not want to stay in the hospital if it is not necessary.  I anticipate getting a CTA chest to rule out pulmonary embolism and I will discuss with him whether he should stay for further cardiac workup given that he is at least moderate risk for ACS or whether he is comfortable getting a second troponin with close outpatient follow-up with Dr. Mariah Milling.  I think the latter option will be acceptable if he is comfortable with the plan.  Even though he is on Eliquis, I will give him a full dose aspirin because I think the antiplatelet effects are important and possible ACS  regardless of chronic anticoagulation.  Regarding the patient's right eye, this is clearly been going on for an extended period of time.  He very much needs an ophthalmologist evaluation, but given that it is not an acute issue, I do not think that he would benefit immediately from ophthalmology coming in to the emergency department at almost midnight.  I may try to touch base with the ophthalmologist but alternatively, because the [facility] is so good about following up with people right away, I may prescribe some antibiotics and recommend close follow-up.the patient's chest pain should take priority tonight.  Clinical Course as of Sep 09 208  Fri Sep 09, 2017  0016 CTA reassuring with no evidence of PE.  Will discuss with patient.  CT Angio Chest PE W/Cm &/Or Wo Cm [CF]  0045 Discussed the plan with the patient.  He thinks that his daughter could take him to the ophthalmologist later today which should be appropriate.  Rather than to complicate her confused the situation, I will avoid prescribing any medications at this time since I do not know exactly what I would be treating.Regarding the chest pain, he still agrees with the plan to recheck a second troponin and follow-up as an outpatient.  If he remains chest pain free and the second troponin is negative I think this is appropriate.   [CF]  0209 Patient is feeling well, not having any chest pain at this time.  He states he had one episode of chest pain while he was here but it was very brief and just a very quick sharp stab that got better immediately.  I think he is appropriate for outpatient follow-up.  I reiterated again that I want him to go to the eye doctor today as well as call Dr. Windell Hummingbird office first thing in the morning to schedule the next  available follow-up appointment.  I gave my usual and customary return precautions.    [CF]    Clinical Course User Index [CF] Loleta Rose, MD     ____________________________________________  FINAL CLINICAL IMPRESSION(S) / ED DIAGNOSES  Final diagnoses:  Chest pain, unspecified type  Pain, eye, right     MEDICATIONS GIVEN DURING THIS VISIT:  Medications  sodium chloride 0.9 % bolus 500 mL (0 mLs Intravenous Stopped 09/09/17 0051)  iohexol (OMNIPAQUE) 350 MG/ML injection 75 mL (75 mLs Intravenous Contrast Given 09/08/17 2320)  aspirin chewable tablet 324 mg (324 mg Oral Given 09/08/17 2347)     ED Discharge Orders    None       Note:  This document was prepared using Dragon voice recognition software and may include unintentional dictation errors.    Loleta Rose, MD 09/09/17 [phone]

## 2017-09-09 ENCOUNTER — Telehealth: Payer: Self-pay

## 2017-09-09 ENCOUNTER — Telehealth: Payer: Self-pay | Admitting: Cardiovascular Disease

## 2017-09-09 DIAGNOSIS — R079 Chest pain, unspecified: Secondary | ICD-10-CM | POA: Diagnosis not present

## 2017-09-09 LAB — TROPONIN I: Troponin I: <0.03 ng/mL (ref <0.03)

## 2017-09-09 NOTE — Discharge Instructions (Signed)
You have been seen in the Emergency Department (ED) today for chest pain.  As we have discussed today?s test results are normal, but you may require further testing.  Please follow up with the recommended doctor as instructed above in these documents regarding today?s emergent visit and your recent symptoms to discuss further management.  Continue to take your regular medications. If you are not doing so already, please also take a daily baby aspirin (81 mg), at least until you follow up with your doctor.  Additionally, as we discussed, we recommend that you call or go directly to the [facility] today to be seen by an ophthalmologist.  You have had symptoms in your right eye for months and it is extremely important that you see a specialist as soon as possible.  Dr. Druscilla Brownie and his colleagues are typically very good about seeing people on the same day and working you into their schedule.  Please explain to the receptionist at the clinic that you were seen in the emergency department and that the doctor ask for you to be seen today, particularly with the weekend coming up.  Return to the Emergency Department (ED) if you experience any further chest pain/pressure/tightness, difficulty breathing, or sudden sweating, or other symptoms that concern you.

## 2017-09-09 NOTE — Telephone Encounter (Signed)
Error

## 2017-09-09 NOTE — Telephone Encounter (Signed)
Lmov for patient to call back They were seen in ED  Will need a fu with Dr Kirke Corin or APP   Will try again at a later time

## 2017-09-09 NOTE — ED Notes (Signed)
Patient taken to room 18 in wheel chair to sit with roommate. Patient verbalized understanding of discharge instructions and follow-up care. NAD noted at this time.

## 2017-09-12 NOTE — Telephone Encounter (Signed)
Scheduled

## 2017-09-14 ENCOUNTER — Ambulatory Visit (INDEPENDENT_AMBULATORY_CARE_PROVIDER_SITE_OTHER): Payer: Medicare HMO | Admitting: Nurse Practitioner

## 2017-09-14 ENCOUNTER — Encounter: Payer: Self-pay | Admitting: Nurse Practitioner

## 2017-09-14 VITALS — BP 120/70 | HR 79 | Ht 72.0 in | Wt 265.8 lb

## 2017-09-14 DIAGNOSIS — I10 Essential (primary) hypertension: Secondary | ICD-10-CM | POA: Diagnosis not present

## 2017-09-14 DIAGNOSIS — I428 Other cardiomyopathies: Secondary | ICD-10-CM | POA: Diagnosis not present

## 2017-09-14 DIAGNOSIS — I481 Persistent atrial fibrillation: Secondary | ICD-10-CM | POA: Diagnosis not present

## 2017-09-14 DIAGNOSIS — I4819 Other persistent atrial fibrillation: Secondary | ICD-10-CM

## 2017-09-14 DIAGNOSIS — I5022 Chronic systolic (congestive) heart failure: Secondary | ICD-10-CM | POA: Diagnosis not present

## 2017-09-14 NOTE — Patient Instructions (Addendum)
Medication Instructions: - Your physician recommends that you continue on your current medications as directed. Please refer to the Current Medication list given to you today.  (please make sure you are taking your blood thinner - Eliquis 5 mg one tablet twice daily)  Labwork: - none ordered  Procedures/Testing: - none ordered  Follow-Up: - Your physician recommends that you schedule a follow-up appointment in: 3-4 weeks with Dr. Kirke Corin (only)    Any Additional Special Instructions Will Be Listed Below (If Applicable).     If you need a refill on your cardiac medications before your next appointment, please call your pharmacy.

## 2017-09-14 NOTE — Progress Notes (Signed)
Office Visit    Patient Name: Eugene Henderson Date of Encounter: 09/14/2017  Primary Care Provider:  Dorcas Carrow, DO Primary Cardiologist:  Judie Petit. Kirke Corin, MD   Chief Complaint    67 year old male with a history of nonischemic cardiomyopathy, HFrEF, persistent atrial fibrillation, hypertension, noncompliance, and ascending Ao aneurysm, who presents for follow-up.  Past Medical History    Past Medical History:  Diagnosis Date  . Arthritis   . Ascending aortic aneurysm (HCC)    a. 09/2017 Stable TAA - 5.1cm.  . Asthma   . Chronic systolic CHF (congestive heart failure) (HCC)    a. EF 25-30% by echo in 07/2016 with cath showing no significant CAD b. 01/2017: EF 30-35% with diffuse HK and moderate MR; c. 07/2017 Echo: EF 30-35%, diff hK. Mild MR, mildly dil LA. PASP .  Marland Kitchen Hypertension   . Hyperthyroidism   . NICM (nonischemic cardiomyopathy) (HCC)   . Noncompliance   . Persistent atrial fibrillation (HCC)    a. CHA2DS2VASc = 3-->Eliquis (? compliance).   Past Surgical History:  Procedure Laterality Date  . JOINT REPLACEMENT Right   . RIGHT/LEFT HEART CATH AND CORONARY ANGIOGRAPHY N/A 08/02/2016   Procedure: Right/Left Heart Cath and Coronary Angiography;  Surgeon: Iran Ouch, MD;  Location: [facility] INVASIVE CV LAB;  Service: Cardiovascular;  Laterality: N/A;  . TESTICLE SURGERY     Patient states that he had to have the tube fixed.    Allergies  No Known Allergies  History of Present Illness    67 year old male with the above past medical history including nonischemic cardiomyopathy, HFrEF, persistent atrial fibrillation, hypertension, noncompliance, and ascending aortic aneurysm.  He was last seen here in clinic in May 2018 but has been followed in heart failure clinic.  He reports compliance with his medications and weighs regularly.  He says he is put on some weight but it is because he just eats all day long.  He is generally not very careful with his salt in that  setting, he has noted some exertional dyspnea when walking up his stairs in his home.  He describes a usual day is getting up in the morning going downstairs getting a whole bunch of food, walking back upstairs, and then watching TV and eating food all day long.  Only when he is hungry later will he go back downstairs to get more food and then back up stairs.  Otherwise he sedentary.  He was recently evaluated in the emergency department secondary to chest pain and dyspnea.  Evaluation was unrevealing.  He had previously undergone catheterization in February 2018 which showed no significant CAD.  Since that ER visit, he has been watching his salt more closely and has been feeling much better.  He remains in atrial fibrillation.  He does not necessarily notice this but is aware that it might be contributing to his cardiomyopathy.  Repeat echo in February continue to show depressed LV function with an EF of 30-35%.  He says now that he would be interested in cardioversion if offered.  That said, he is not entirely sure that he has been taking his Eliquis on a regular basis.  In fact, he is pretty sure he has not been.  He denies PND, orthopnea, dizziness, syncope, edema, or early satiety.  Home Medications    Prior to Admission medications   Medication Sig Start Date End Date Taking? Authorizing Provider  acetaminophen (TYLENOL) 500 MG tablet Take 500 mg by mouth every 6 (six)  hours as needed.   Yes [provider]  albuterol (PROVENTIL HFA;VENTOLIN HFA) 108 (90 Base) MCG/ACT inhaler Inhale 2 puffs into the lungs every 6 (six) hours as needed for wheezing or shortness of breath. 07/05/17  Yes Clarisa Kindred A, FNP  apixaban (ELIQUIS) 5 MG TABS tablet Take 1 tablet (5 mg total) by mouth 2 (two) times daily. 05/09/17  Yes Bhakta, Megan P, DO  budesonide-formoterol (SYMBICORT) 160-4.5 MCG/ACT inhaler Inhale 2 puffs into the lungs 2 (two) times daily. 07/05/17  Yes Hackney, Inetta Fermo A, FNP  cyclobenzaprine  (FLEXERIL) 10 MG tablet Take 1 tablet (10 mg total) by mouth at bedtime. 05/18/17  Yes Kirchgessner, Megan P, DO  digoxin (LANOXIN) 0.125 MG tablet Take 1 tablet (0.125 mg total) by mouth daily. 05/09/17  Yes Siddique, Megan P, DO  feeding supplement (BOOST / RESOURCE BREEZE) LIQD Take 1 Container by mouth 2 (two) times daily between meals. 11/26/16  Yes Delfino Lovett, MD  furosemide (LASIX) 40 MG tablet Take 1 tablet (40 mg total) by mouth daily. 07/17/17  Yes Salary, Evelena Asa, MD  gentamicin (GARAMYCIN) 0.3 % ophthalmic solution Place 1 drop into both eyes every 4 (four) hours. 05/06/17  Yes Joni Reining, PA-C  methimazole (TAPAZOLE) 10 MG tablet Take 2 tablets (20 mg total) by mouth 2 (two) times daily. 05/09/17  Yes Brocato, Megan P, DO  metoprolol succinate (TOPROL-XL) 25 MG 24 hr tablet Take 1 tablet (25 mg total) by mouth daily. 07/17/17  Yes Salary, Evelena Asa, MD  metroNIDAZOLE (FLAGYL) 500 MG tablet Take 1 tablet (500 mg total) by mouth 3 (three) times daily. 05/16/17  Yes Lattie Haw, MD  moxifloxacin (VIGAMOX) 0.5 % ophthalmic solution Place 1 drop into the right eye as directed.   Yes [provider]  naphazoline-pheniramine (NAPHCON-A) 0.025-0.3 % ophthalmic solution Place 1 drop into both eyes 4 (four) times daily as needed for eye irritation. 05/06/17  Yes Joni Reining, PA-C  potassium chloride SA (K-DUR,KLOR-CON) 20 MEQ tablet Take 1 tablet (20 mEq total) by mouth daily. 07/05/17  Yes Hackney, Inetta Fermo A, FNP  prednisoLONE acetate (PRED FORTE) 1 % ophthalmic suspension Place 1 drop into the right eye 4 (four) times daily.   Yes [provider]  sacubitril-valsartan (ENTRESTO) 24-26 MG Take 1 tablet by mouth 2 (two) times daily.   Yes [provider]  spironolactone (ALDACTONE) 25 MG tablet Take 0.5 tablets (12.5 mg total) by mouth daily. 07/05/17  Yes Hackney, Tina A, FNP  tobramycin (TOBREX) 0.3 % ophthalmic ointment Place into both eyes every 4 (four) hours while  awake. 05/16/17  Yes Lattie Haw, MD    Review of Systems    Has had some dyspnea on exertion if he is walking upstairs and this seems to be somewhat tied to his diet and salt intake.  He denies PND, orthopnea, dizziness, syncope, edema, or early satiety.  All other systems reviewed and are otherwise negative except as noted above.  Physical Exam    VS:  BP 120/70 (BP Location: Left Arm, Patient Position: Sitting, Cuff Size: Large)   Pulse 79   Ht 6' (1.829 m)   Wt 265 lb 12 oz (120.5 kg)   SpO2 98%   BMI 36.04 kg/m  , BMI Body mass index is 36.04 kg/m. GEN: Well nourished, well developed, in no acute distress.  HEENT: Left eye is cloudy and he cannot see out of this. Neck: Supple, no JVD, carotid bruits, or masses. Cardiac: Irregularly irregular,  no murmurs, rubs, or gallops. No clubbing, cyanosis, edema.  Radials/DP/PT 2+ and equal bilaterally.  Respiratory:  Respirations regular and unlabored, clear to auscultation bilaterally. GI: Obese, soft, nontender, nondistended, BS + x 4. MS: no deformity or atrophy. Skin: warm and dry, no rash. Neuro:  Strength and sensation are intact. Psych: Normal affect.  Accessory Clinical Findings    ECG -atrial fibrillation, 79, left axis deviation, left anterior fascicular block, PVCs, LVH, minimal lateral ST depression.  No acute changes.  Assessment & Plan    1.  Nonischemic cardiomyopathy/HFrEF: Euvolemic today.  He was recently evaluated in the ER due to shortness of breath with walking upstairs and also some chest discomfort.  Evaluation was unrevealing.  He says that since then, he has been watching his salt intake more closely and in that setting, breathing has improved significantly.  His weight is up overall but he describes a very sedentary lifestyle where he is really only walking up and down his stairs maybe twice a day and otherwise just sitting or laying around and eating.  He has not had any PND or orthopnea and dyspnea is now  stable.  He reports compliance with medications but on further questioning says he is not really sure what he is taking or when he is taking it.  He is aware that atrial fibrillation may be contributing to heart failure.  He was previously offered cardioversion but refused but now would be interested.  EF 30-35% with only mildly dilated left atrium.  He remains on beta-blocker, and Entresto, spironolactone, digoxin.  We discussed the importance for compliance with Eliquis so that we could reconsider cardioversion.  2.  Persistent atrial fibrillation: This has been present for many months now and rate controlled on beta-blocker therapy.  Left atrium was only mildly dilated.  He is now interested in cardioversion but as above, is fairly certain that he has been missing some doses of Eliquis.  I stressed the importance of compliance.  He said he is going to pay special attention to his medications to make sure that he is taking Eliquis.  We will plan to have him follow-up in about 3 weeks and provided that he has been compliant with Eliquis, we can consider cardioversion and then reassess LV function in sinus rhythm.  He may require antiarrhythmic therapy to maintain sinus rhythm.  3.  Essential hypertension: Stable on beta-blocker and Entresto.  4.  Ascending aortic aneurysm: Stable by recent CT.  5.1 cm.  5.  Disposition: Follow-up in approximately 1 month at which time we can reconsider cardioversion.  Nicolasa Ducking, NP 09/14/2017, 5:56 PM

## 2017-09-29 ENCOUNTER — Other Ambulatory Visit: Payer: Self-pay | Admitting: Family Medicine

## 2017-10-02 NOTE — Progress Notes (Signed)
Patient ID: Eugene Henderson, male    DOB: [birth year], 67 y.o.   MRN: [id number]  HPI  [name] is a 67 y/o male with a history of atrial fibrillation, hyperthyroidism, HTN, asthma, arthritis, current tobacco use and chronic heart failure.   Echo done 07/14/17 reviewed and showed an EF of 30-35% along with a mildly elevated PA pressure of 40 mm Hg. Last echo done 02/02/17 was 30-35%. Echo done 07/29/16 and showed an EF of 25-30% along with moderate MR and moderately increased PA pressure of 50 mm Hg. 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   Was in the ED 09/08/17 due to chest pain where he was treated and released. Admitted 07/14/17 due to chest pain and HF exacerbation due to dietary indiscretion. initially needed IV lasix and then transitioned to oral diuretics. Cardiology consult done and he was discharged after 2 days. Admitted 06/24/16 due to corneal ulcer and possible secondary endophthalmitis. Hospital stay was complicated by hypokalemia and hyponatremia secondary to volume depletion. Ophthalmology consult obtained. Antibiotics were given. Discharged after 6 days.  Admitted 05/13/17 due to acute diverticulitis. Initially needed IV antibiotics and then transitioned to oral medications. Discharged after 3 days. Was in the ED 05/06/17 due to right eye bacterial conjunctivitis. Was given eye drops and released.   He presents today for his follow-up appointment with a chief complaint of minimal shortness of breath upon moderate exertion. He says this has been chronic in nature having been present for several years. He has associated cough, dizziness and right eye pain/discharge along with this. He denies any difficulty sleeping, abdominal distention, palpitations, leg edema, chest pain, wheezing, fatigue or weight gain.   Past Medical History:  Diagnosis Date  . Arthritis   . Ascending aortic aneurysm (HCC)    a. 09/2017  Stable TAA - 5.1cm.  . Asthma   . Chronic systolic CHF (congestive heart failure) (HCC)    a. EF 25-30% by echo in 07/2016 with cath showing no significant CAD b. 01/2017: EF 30-35% with diffuse HK and moderate MR; c. 07/2017 Echo: EF 30-35%, diff hK. Mild MR, mildly dil LA. PASP .  Marland Kitchen Hypertension   . Hyperthyroidism   . NICM (nonischemic cardiomyopathy) (HCC)   . Noncompliance   . Persistent atrial fibrillation (HCC)    a. CHA2DS2VASc = 3-->Eliquis (? compliance).   Past Surgical History:  Procedure Laterality Date  . JOINT REPLACEMENT Right   . RIGHT/LEFT HEART CATH AND CORONARY ANGIOGRAPHY N/A 08/02/2016   Procedure: Right/Left Heart Cath and Coronary Angiography;  Surgeon: Iran Ouch, MD;  Location: [facility] INV[facility] CV [facility];  Service: Cardiovascular;  Laterality: N/A;  . TESTICLE SURGERY     Patient states that he had to have the tube fixed.   Family History  Problem Relation Age of Onset  . Diabetes Mother   . Diabetes Father   . Heart disease Brother   . Heart attack Brother   . Diabetes Brother   . Kidney failure Brother    Social History   Tobacco Use  . Smoking status: Current Some Day Smoker    Packs/day: 0.25    Years: 25.00    Pack years: 6.25    Types: Cigarettes  . Smokeless tobacco: Never Used  Substance Use Topics  . Alcohol use: Yes    Comment: Socially - once a month   No Known Allergies  Prior to Admission medications   Medication Sig Start Date  End Date Taking? Authorizing Provider  Difluprednate (DUREZOL) 0.05 % EMUL Place 1 drop into the right eye every 4 (four) hours while awake.   Yes [provider]  moxifloxacin (VIGAMOX) 0.5 % ophthalmic solution Place 1 drop into the right eye every 4 (four) hours while awake.    Yes [provider]  prednisoLONE acetate (PRED FORTE) 1 % ophthalmic suspension Place 1 drop into the right eye every 2 (two) hours while awake.    Yes [provider]  acetaminophen (TYLENOL) 500  MG tablet Take 500 mg by mouth every 6 (six) hours as needed.    [provider]  albuterol (PROVENTIL HFA;VENTOLIN HFA) 108 (90 Base) MCG/ACT inhaler Inhale 2 puffs into the lungs every 6 (six) hours as needed for wheezing or shortness of breath. 07/05/17   Delma Freeze, FNP  budesonide-formoterol (SYMBICORT) 160-4.5 MCG/ACT inhaler Inhale 2 puffs into the lungs 2 (two) times daily. 07/05/17   Delma Freeze, FNP  cyclobenzaprine (FLEXERIL) 10 MG tablet Take 1 tablet (10 mg total) by mouth at bedtime. 05/18/17   Hersh, Megan P, DO  digoxin (LANOXIN) 0.125 MG tablet Take 1 tablet (0.125 mg total) by mouth daily. 05/09/17   Colasanti, Megan P, DO  ELIQUIS 5 MG TABS tablet TAKE 1 TABLET BY MOUTH TWICE A DAY 09/29/17   Wilds, Megan P, DO  feeding supplement (BOOST / RESOURCE BREEZE) LIQD Take 1 Container by mouth 2 (two) times daily between meals. 11/26/16   Delfino Lovett, MD  furosemide (LASIX) 40 MG tablet Take 1 tablet (40 mg total) by mouth daily. 07/17/17   Salary, Evelena Asa, MD  gentamicin (GARAMYCIN) 0.3 % ophthalmic solution Place 1 drop into both eyes every 4 (four) hours. 05/06/17   Joni Reining, PA-C  methimazole (TAPAZOLE) 10 MG tablet Take 2 tablets (20 mg total) by mouth 2 (two) times daily. 05/09/17   Vasseur, Megan P, DO  metoprolol succinate (TOPROL-XL) 25 MG 24 hr tablet Take 1 tablet (25 mg total) by mouth daily. 07/17/17   Salary, Evelena Asa, MD  metroNIDAZOLE (FLAGYL) 500 MG tablet Take 1 tablet (500 mg total) by mouth 3 (three) times daily. 05/16/17   Lattie Haw, MD  naphazoline-pheniramine (NAPHCON-A) 0.025-0.3 % ophthalmic solution Place 1 drop into both eyes 4 (four) times daily as needed for eye irritation. 05/06/17   Joni Reining, PA-C  potassium chloride SA (K-DUR,KLOR-CON) 20 MEQ tablet Take 1 tablet (20 mEq total) by mouth daily. 07/05/17   Delma Freeze, FNP  sacubitril-valsartan (ENTRESTO) 24-26 MG Take 1 tablet by mouth 2 (two) times daily.    [provider]  spironolactone (ALDACTONE) 25 MG tablet Take 0.5 tablets (12.5 mg total) by mouth daily. 07/05/17   Delma Freeze, FNP  tobramycin (TOBREX) 0.3 % ophthalmic ointment Place into both eyes every 4 (four) hours while awake. 05/16/17   Lattie Haw, MD    Review of Systems  Constitutional: Negative for appetite change and fatigue.  HENT: Negative for congestion, postnasal drip and sore throat.   Eyes: Positive for pain (right eye), discharge (especially in the morning) and visual disturbance (blurry vision in right eye).  Respiratory: Positive for cough and shortness of breath (when getting up steps). Negative for chest tightness and wheezing.   Cardiovascular: Negative for chest pain, palpitations and leg swelling.  Gastrointestinal: Negative for abdominal distention and abdominal pain.  Endocrine: Negative.   Genitourinary: Negative.   Musculoskeletal: Positive for arthralgias (knee pain) and  back pain.  Skin: Negative.   Allergic/Immunologic: Negative.   Neurological: Positive for dizziness. Negative for weakness and light-headedness.  Hematological: Negative for adenopathy. Does not bruise/bleed easily.  Psychiatric/Behavioral: Negative for dysphoric mood and sleep disturbance (sleeping better). The patient is not nervous/anxious.    Vitals:   10/04/17 1043  BP: (!) 142/95  Pulse: 68  Resp: 18  SpO2: 100%  Weight: 266 lb 2 oz (120.7 kg)  Height: 6' (1.829 m)   Wt Readings from Last 3 Encounters:  10/04/17 266 lb 2 oz (120.7 kg)  09/14/17 265 lb 12 oz (120.5 kg)  09/08/17 285 lb (129.3 kg)   Lab Results  Component Value Date   CREATININE 0.99 09/08/2017   CREATININE 0.90 07/16/2017   CREATININE 0.91 07/15/2017    Physical Exam  Constitutional: He is oriented to person, place, and time. He appears well-developed and well-nourished.  HENT:  Head: Normocephalic and atraumatic.  Eyes: Right eye exhibits discharge. Right conjunctiva is injected. Right eye  exhibits abnormal extraocular motion.  Right eye iris is clouded over  Neck: Normal range of motion. Neck supple. No JVD present.  Cardiovascular: Normal rate. An irregular rhythm present.  Pulmonary/Chest: Effort normal. He has no wheezes. He has no rales.  Abdominal: Soft. He exhibits no distension. There is no tenderness.  Musculoskeletal: He exhibits no edema or tenderness.  Neurological: He is alert and oriented to person, place, and time.  Skin: Skin is warm and dry.  Psychiatric: He has a normal mood and affect. His behavior is normal. Thought content normal.  Nursing note and vitals reviewed.  Assessment & Plan:  1: Chronic heart failure with reduced ejection fraction- - NYHA class II - euvolemic today - admits to not weighing daily at home because of his vision and he was instructed to call for an overnight weight gain of >2 pounds or a weekly weight gain of >5 pounds when he does weigh - weight stable from 09/14/17 - still using salt on some foods;  Instructed to not add any salt to food. Reminded him to follow a 2000mg  sodium diet.  - BNP from 07/14/17 was 38.0 - saw cardiology [name]) 09/14/17 - PharmD reviewed medications with the patient  2: HTN- - BP mildly elevated but he isn't sure what medications he takes - BMP from 09/08/17 reviewed which shows sodium 134, potassium 3.4 and GFR >60 - saw PCP Laural Benes) on 05/09/17  3: Atrial fibrillation- - currently on apixaban, digoxin and max metoprolol XL - last dig level on 07/14/17 was 0.4 - discussion being had regarding possible cardioversion in the future  4. Keratitis- - using multiple eye drops at this time - has an appointment next week about possibly removing the right eye; patient reports very little vision in that eye  5: Tobacco- - currently smoking tobacco 1 pack cigarettes/week - complete cessation discussed for 3 minutes with him  Patient did not bring his medications nor a list and is unable to state/confirm  what medications he is taking. Says that he uses a pillbox at home that his daughter fixes for him. Emphasized the importance of bringing medication bottles with him.   Return in 1 month or sooner for any questions/problems before then.

## 2017-10-04 ENCOUNTER — Encounter: Payer: Self-pay | Admitting: Family

## 2017-10-04 ENCOUNTER — Ambulatory Visit: Payer: Medicare HMO | Attending: Family | Admitting: Family

## 2017-10-04 VITALS — BP 142/95 | HR 68 | Resp 18 | Ht 72.0 in | Wt 266.1 lb

## 2017-10-04 DIAGNOSIS — I5022 Chronic systolic (congestive) heart failure: Secondary | ICD-10-CM | POA: Diagnosis not present

## 2017-10-04 DIAGNOSIS — Z720 Tobacco use: Secondary | ICD-10-CM

## 2017-10-04 DIAGNOSIS — I481 Persistent atrial fibrillation: Secondary | ICD-10-CM | POA: Insufficient documentation

## 2017-10-04 DIAGNOSIS — I4819 Other persistent atrial fibrillation: Secondary | ICD-10-CM

## 2017-10-04 DIAGNOSIS — I110 Hypertensive heart disease with heart failure: Secondary | ICD-10-CM | POA: Insufficient documentation

## 2017-10-04 DIAGNOSIS — I712 Thoracic aortic aneurysm, without rupture: Secondary | ICD-10-CM | POA: Diagnosis not present

## 2017-10-04 DIAGNOSIS — F17210 Nicotine dependence, cigarettes, uncomplicated: Secondary | ICD-10-CM | POA: Insufficient documentation

## 2017-10-04 DIAGNOSIS — E876 Hypokalemia: Secondary | ICD-10-CM | POA: Diagnosis not present

## 2017-10-04 DIAGNOSIS — I10 Essential (primary) hypertension: Secondary | ICD-10-CM

## 2017-10-04 DIAGNOSIS — Z79899 Other long term (current) drug therapy: Secondary | ICD-10-CM | POA: Diagnosis not present

## 2017-10-04 DIAGNOSIS — J45909 Unspecified asthma, uncomplicated: Secondary | ICD-10-CM | POA: Insufficient documentation

## 2017-10-04 DIAGNOSIS — H169 Unspecified keratitis: Secondary | ICD-10-CM | POA: Diagnosis not present

## 2017-10-04 NOTE — Patient Instructions (Signed)
Continue weighing daily and call for an overnight weight gain of > 2 pounds or a weekly weight gain of >5 pounds. 

## 2017-10-14 ENCOUNTER — Ambulatory Visit: Payer: Medicare (Managed Care) | Admitting: Cardiovascular Disease

## 2017-10-14 NOTE — Progress Notes (Deleted)
Cardiology Office Note   Date:  10/14/2017   ID:  Eugene Henderson, DOB [birth year], MRN [id number]  PCP:  Dorcas Carrow, DO  Cardiologist:   Lorine Bears, MD   No chief complaint on file.     History of Present Illness: Eugene Henderson is a 67 y.o. male who presents for a follow-up visit regarding chronic systolic heart failure due to nonischemic cardiomyopathy and atrial fibrillation. The patient was hospitalized in February with acute systolic heart failure in the setting of atrial fibrillation with rapid ventricular response. He is known to have history of hyperthyroidism and was not taking treatment for that due to financial reasons. He had an echocardiogram done which showed an ejection fraction of 25-30%, moderate mitral regurgitation and moderate pulmonary hypertension. I performed cardiac catheterization which showed no significant coronary artery disease. He was started on medical therapy for heart failure and anticoagulation for atrial fibrillation with Eliquis.   He had issues with obtaining medications initially but was able to get Medicaid and has been taking his medications regularly. He has been following with in the heart failure clinic. He was seen today and the plans are to start him on Entresto. He is feeling better overall. He gained weight since March but he attributes that to poor diet choices. He continues to be in atrial fibrillation with reasonably controlled ventricular rate. He denies any chest pain. He reports improvement in exertional dyspnea and orthopnea.   Past Medical History:  Diagnosis Date  . Arthritis   . Ascending aortic aneurysm (HCC)    a. 09/2017 Stable TAA - 5.1cm.  . Asthma   . Chronic systolic CHF (congestive heart failure) (HCC)    a. EF 25-30% by echo in 07/2016 with cath showing no significant CAD b. 01/2017: EF 30-35% with diffuse HK and moderate MR; c. 07/2017 Echo: EF 30-35%, diff hK. Mild MR, mildly dil LA. PASP .  Marland Kitchen  Hypertension   . Hyperthyroidism   . NICM (nonischemic cardiomyopathy) (HCC)   . Noncompliance   . Persistent atrial fibrillation (HCC)    a. CHA2DS2VASc = 3-->Eliquis (? compliance).    Past Surgical History:  Procedure Laterality Date  . JOINT REPLACEMENT Right   . RIGHT/LEFT HEART CATH AND CORONARY ANGIOGRAPHY N/A 08/02/2016   Procedure: Right/Left Heart Cath and Coronary Angiography;  Surgeon: Iran Ouch, MD;  Location: [facility] INVASIVE CV LAB;  Service: Cardiovascular;  Laterality: N/A;  . TESTICLE SURGERY     Patient states that he had to have the tube fixed.     Current Outpatient Medications  Medication Sig Dispense Refill  . acetaminophen (TYLENOL) 500 MG tablet Take 500 mg by mouth every 6 (six) hours as needed.    Marland Kitchen albuterol (PROVENTIL HFA;VENTOLIN HFA) 108 (90 Base) MCG/ACT inhaler Inhale 2 puffs into the lungs every 6 (six) hours as needed for wheezing or shortness of breath. 1 Inhaler 3  . budesonide-formoterol (SYMBICORT) 160-4.5 MCG/ACT inhaler Inhale 2 puffs into the lungs 2 (two) times daily. 3 Inhaler 3  . cyclobenzaprine (FLEXERIL) 10 MG tablet Take 1 tablet (10 mg total) by mouth at bedtime. 30 tablet 0  . Difluprednate (DUREZOL) 0.05 % EMUL Place 1 drop into the right eye every 4 (four) hours while awake.    . digoxin (LANOXIN) 0.125 MG tablet Take 1 tablet (0.125 mg total) by mouth daily. 90 tablet 1  . ELIQUIS 5 MG TABS tablet TAKE 1 TABLET BY MOUTH TWICE A DAY 180 tablet 4  . feeding  supplement (BOOST / RESOURCE BREEZE) LIQD Take 1 Container by mouth 2 (two) times daily between meals. 60 Container 2  . furosemide (LASIX) 40 MG tablet Take 1 tablet (40 mg total) by mouth daily. 30 tablet 0  . gentamicin (GARAMYCIN) 0.3 % ophthalmic solution Place 1 drop into both eyes every 4 (four) hours. 5 mL 0  . methimazole (TAPAZOLE) 10 MG tablet Take 2 tablets (20 mg total) by mouth 2 (two) times daily. 180 tablet 1  . metoprolol succinate (TOPROL-XL) 25 MG 24 hr tablet  Take 1 tablet (25 mg total) by mouth daily. 90 tablet 0  . metroNIDAZOLE (FLAGYL) 500 MG tablet Take 1 tablet (500 mg total) by mouth 3 (three) times daily. 30 tablet 1  . moxifloxacin (VIGAMOX) 0.5 % ophthalmic solution Place 1 drop into the right eye every 4 (four) hours while awake.     . naphazoline-pheniramine (NAPHCON-A) 0.025-0.3 % ophthalmic solution Place 1 drop into both eyes 4 (four) times daily as needed for eye irritation. 15 mL 0  . potassium chloride SA (K-DUR,KLOR-CON) 20 MEQ tablet Take 1 tablet (20 mEq total) by mouth daily. 90 tablet 3  . prednisoLONE acetate (PRED FORTE) 1 % ophthalmic suspension Place 1 drop into the right eye every 2 (two) hours while awake.     . sacubitril-valsartan (ENTRESTO) 24-26 MG Take 1 tablet by mouth 2 (two) times daily.    Marland Kitchen spironolactone (ALDACTONE) 25 MG tablet Take 0.5 tablets (12.5 mg total) by mouth daily. 45 tablet 5  . tobramycin (TOBREX) 0.3 % ophthalmic ointment Place into both eyes every 4 (four) hours while awake. 3.5 g 0   No current facility-administered medications for this visit.     Allergies:   Patient has no known allergies.    Social History:  The patient  reports that he has been smoking cigarettes.  He has a 6.25 pack-year smoking history. He has never used smokeless tobacco. He reports that he drinks alcohol. He reports that he does not use drugs.   Family History:  The patient's family history includes Diabetes in his brother, father, and mother; Heart attack in his brother; Heart disease in his brother; Kidney failure in his brother.    ROS:  Please see the history of present illness.   Otherwise, review of systems are positive for none.   All other systems are reviewed and negative.    PHYSICAL EXAM: VS:  There were no vitals taken for this visit. , BMI There is no height or weight on file to calculate BMI. GEN: Well nourished, well developed, in no acute distress  HEENT: normal  Neck: no JVD, carotid bruits, or  masses Cardiac: Irregularly irregular; no murmurs, rubs, or gallops,no edema  Respiratory:  clear to auscultation bilaterally, normal work of breathing GI: soft, nontender, nondistended, + BS MS: no deformity or atrophy  Skin: warm and dry, no rash Neuro:  Strength and sensation are intact Psych: euthymic mood, full affect   EKG:  EKG is ordered today. The ekg ordered today demonstrates  atrial fibrillation with PVCs and nonspecific T wave changes. Ventricular rate is 97 bpm.   Recent Labs: 05/14/2017: ALT 45 07/14/2017: B Natriuretic Peptide 38.0; TSH 0.363 07/16/2017: Magnesium 1.7 09/08/2017: BUN 11; Creatinine, Ser 0.99; Hemoglobin 13.8; Platelets 197; Potassium 3.4; Sodium 134    Lipid Panel    Component Value Date/Time   CHOL 134 05/09/2017 1143   TRIG 80 05/09/2017 1143   HDL 67 05/09/2017 1143   LDLCALC 51  05/09/2017 1143      Wt Readings from Last 3 Encounters:  10/04/17 266 lb 2 oz (120.7 kg)  09/14/17 265 lb 12 oz (120.5 kg)  09/08/17 285 lb (129.3 kg)      PAD Screen 08/30/2016  Previous PAD dx? No  Previous surgical procedure? No  Pain with walking? Yes  Subsides with rest? Yes  Feet/toe relief with dangling? Yes  Painful, non-healing ulcers? No  Extremities discolored? No      ASSESSMENT AND PLAN:  1.  Chronic systolic heart failure: Ejection fraction was severely reduced. His cardiomyopathy is likely tachycardia-induced in the setting of atrial fibrillation and hyperthyroidism. He has improved symptomatically and has been more compliant with his medications. He appears to be euvolemic on current dose of furosemide. He is otherwise on optimal medical therapy and was just started on Entresto by the heart failure clinic. I'm going to repeat his echocardiogram in 2 months from now to reevaluate his EF on optimal medical therapy.  2. Essential hypertension: Blood pressure is mildly elevated but should improve with the addition of Entresto.   3. Persistent  atrial fibrillation:  I discussed with him the option of proceeding with cardioversion now that he has been anticoagulated effectively. He is not interested in proceeding with this. He reports improvement in symptoms.  Disposition:   FU with me in 4 months  Signed,  Lorine Bears, MD  10/14/2017 8:50 AM    [facility]

## 2017-10-17 ENCOUNTER — Encounter: Payer: Self-pay | Admitting: Cardiovascular Disease

## 2017-10-23 NOTE — Progress Notes (Deleted)
Patient ID: Eugene Henderson, male    DOB: [birth year], 67 y.o.   MRN: [id number]  HPI  Mr Merrow is a 67 y/o male with a history of atrial fibrillation, hyperthyroidism, HTN, asthma, arthritis, current tobacco use and chronic heart failure.   Echo done 07/14/17 reviewed and showed an EF of 30-35% along with a mildly elevated PA pressure of 40 mm Hg. Last echo done 02/02/17 was 30-35%. Echo done 07/29/16 and showed an EF of 25-30% along with moderate MR and moderately increased PA pressure of 50 mm Hg. 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   Was in the ED 09/08/17 due to chest pain where he was treated and released. Admitted 07/14/17 due to chest pain and HF exacerbation due to dietary indiscretion. initially needed IV lasix and then transitioned to oral diuretics. Cardiology consult done and he was discharged after 2 days. Admitted 06/24/16 due to corneal ulcer and possible secondary endophthalmitis. Hospital stay was complicated by hypokalemia and hyponatremia secondary to volume depletion. Ophthalmology consult obtained. Antibiotics were given. Discharged after 6 days.  Admitted 05/13/17 due to acute diverticulitis. Initially needed IV antibiotics and then transitioned to oral medications. Discharged after 3 days. Was in the ED 05/06/17 due to right eye bacterial conjunctivitis. Was given eye drops and released.   He presents today for his follow-up appointment with a chief complaint of   Past Medical History:  Diagnosis Date  . Arthritis   . Ascending aortic aneurysm (HCC)    a. 09/2017 Stable TAA - 5.1cm.  . Asthma   . Chronic systolic CHF (congestive heart failure) (HCC)    a. EF 25-30% by echo in 07/2016 with cath showing no significant CAD b. 01/2017: EF 30-35% with diffuse HK and moderate MR; c. 07/2017 Echo: EF 30-35%, diff hK. Mild MR, mildly dil LA. PASP .  Marland Kitchen Hypertension   . Hyperthyroidism   . NICM  (nonischemic cardiomyopathy) (HCC)   . Noncompliance   . Persistent atrial fibrillation (HCC)    a. CHA2DS2VASc = 3-->Eliquis (? compliance).   Past Surgical History:  Procedure Laterality Date  . JOINT REPLACEMENT Right   . RIGHT/LEFT HEART CATH AND CORONARY ANGIOGRAPHY N/A 08/02/2016   Procedure: Right/Left Heart Cath and Coronary Angiography;  Surgeon: Iran Ouch, MD;  Location: [facility] INVASIVE CV LAB;  Service: Cardiovascular;  Laterality: N/A;  . TESTICLE SURGERY     Patient states that he had to have the tube fixed.   Family History  Problem Relation Age of Onset  . Diabetes Mother   . Diabetes Father   . Heart disease Brother   . Heart attack Brother   . Diabetes Brother   . Kidney failure Brother    Social History   Tobacco Use  . Smoking status: Current Some Day Smoker    Packs/day: 0.25    Years: 25.00    Pack years: 6.25    Types: Cigarettes  . Smokeless tobacco: Never Used  Substance Use Topics  . Alcohol use: Yes    Comment: Socially - once a month   No Known Allergies    Review of Systems  Constitutional: Negative for appetite change and fatigue.  HENT: Negative for congestion, postnasal drip and sore throat.   Eyes: Positive for pain (right eye), discharge (especially in the morning) and visual disturbance (blurry vision in right eye).  Respiratory: Positive for cough and shortness of breath (when getting up steps). Negative for chest  tightness and wheezing.   Cardiovascular: Negative for chest pain, palpitations and leg swelling.  Gastrointestinal: Negative for abdominal distention and abdominal pain.  Endocrine: Negative.   Genitourinary: Negative.   Musculoskeletal: Positive for arthralgias (knee pain) and back pain.  Skin: Negative.   Allergic/Immunologic: Negative.   Neurological: Positive for dizziness. Negative for weakness and light-headedness.  Hematological: Negative for adenopathy. Does not bruise/bleed easily.  Psychiatric/Behavioral:  Negative for dysphoric mood and sleep disturbance (sleeping better). The patient is not nervous/anxious.      Physical Exam  Constitutional: He is oriented to person, place, and time. He appears well-developed and well-nourished.  HENT:  Head: Normocephalic and atraumatic.  Eyes: Right eye exhibits discharge. Right conjunctiva is injected. Right eye exhibits abnormal extraocular motion.  Right eye iris is clouded over  Neck: Normal range of motion. Neck supple. No JVD present.  Cardiovascular: Normal rate. An irregular rhythm present.  Pulmonary/Chest: Effort normal. He has no wheezes. He has no rales.  Abdominal: Soft. He exhibits no distension. There is no tenderness.  Musculoskeletal: He exhibits no edema or tenderness.  Neurological: He is alert and oriented to person, place, and time.  Skin: Skin is warm and dry.  Psychiatric: He has a normal mood and affect. His behavior is normal. Thought content normal.  Nursing note and vitals reviewed.  Assessment & Plan:  1: Chronic heart failure with reduced ejection fraction- - NYHA class II - euvolemic today - admits to not weighing daily at home because of his vision and he was instructed to call for an overnight weight gain of >2 pounds or a weekly weight gain of >5 pounds when he does weigh - weight stable from 09/14/17 - still using salt on some foods;  Instructed to not add any salt to food. Reminded him to follow a 2000mg  sodium diet.  - BNP from 07/14/17 was 38.0 - saw cardiology [name]) 09/14/17 - PharmD reviewed medications with the patient  2: HTN- - BP mildly elevated but he isn't sure what medications he takes - BMP from 09/08/17 reviewed which shows sodium 134, potassium 3.4 and GFR >60 - saw PCP Laural Benes) on 05/09/17  3: Atrial fibrillation- - currently on apixaban, digoxin and max metoprolol XL - last dig level on 07/14/17 was 0.4 - discussion being had regarding possible cardioversion in the future  4. Keratitis- -  using multiple eye drops at this time - has an appointment next week about possibly removing the right eye; patient reports very little vision in that eye  5: Tobacco- - currently smoking tobacco 1 pack cigarettes/week - complete cessation discussed for 3 minutes with him  Patient did not bring his medications nor a list and is unable to state/confirm what medications he is taking. Says that he uses a pillbox at home that his daughter fixes for him. Emphasized the importance of bringing medication bottles with him.

## 2017-10-25 ENCOUNTER — Ambulatory Visit: Payer: Medicare HMO | Admitting: Family

## 2017-11-30 ENCOUNTER — Other Ambulatory Visit: Payer: Self-pay | Admitting: Family Medicine

## 2018-03-02 ENCOUNTER — Emergency Department
Admission: EM | Admit: 2018-03-02 | Discharge: 2018-03-02 | Disposition: A | Discharge status: Home / Self Care | Payer: Medicare HMO | Attending: Emergency Medicine | Admitting: Emergency Medicine

## 2018-03-02 ENCOUNTER — Emergency Department: Payer: Medicare HMO

## 2018-03-02 ENCOUNTER — Encounter: Payer: Self-pay | Admitting: Emergency Medicine

## 2018-03-02 ENCOUNTER — Other Ambulatory Visit: Payer: Self-pay

## 2018-03-02 DIAGNOSIS — I110 Hypertensive heart disease with heart failure: Secondary | ICD-10-CM | POA: Insufficient documentation

## 2018-03-02 DIAGNOSIS — F17210 Nicotine dependence, cigarettes, uncomplicated: Secondary | ICD-10-CM | POA: Insufficient documentation

## 2018-03-02 DIAGNOSIS — M542 Cervicalgia: Secondary | ICD-10-CM | POA: Diagnosis not present

## 2018-03-02 DIAGNOSIS — R51 Headache: Secondary | ICD-10-CM | POA: Diagnosis not present

## 2018-03-02 DIAGNOSIS — S199XXA Unspecified injury of neck, initial encounter: Secondary | ICD-10-CM | POA: Diagnosis not present

## 2018-03-02 DIAGNOSIS — R079 Chest pain, unspecified: Secondary | ICD-10-CM | POA: Diagnosis present

## 2018-03-02 DIAGNOSIS — R270 Ataxia, unspecified: Secondary | ICD-10-CM | POA: Diagnosis not present

## 2018-03-02 DIAGNOSIS — I5022 Chronic systolic (congestive) heart failure: Secondary | ICD-10-CM | POA: Insufficient documentation

## 2018-03-02 DIAGNOSIS — W109XXA Fall (on) (from) unspecified stairs and steps, initial encounter: Secondary | ICD-10-CM | POA: Insufficient documentation

## 2018-03-02 DIAGNOSIS — R0789 Other chest pain: Secondary | ICD-10-CM

## 2018-03-02 DIAGNOSIS — Y929 Unspecified place or not applicable: Secondary | ICD-10-CM | POA: Diagnosis not present

## 2018-03-02 DIAGNOSIS — S299XXA Unspecified injury of thorax, initial encounter: Secondary | ICD-10-CM | POA: Diagnosis not present

## 2018-03-02 DIAGNOSIS — Y999 Unspecified external cause status: Secondary | ICD-10-CM | POA: Diagnosis not present

## 2018-03-02 DIAGNOSIS — Y9301 Activity, walking, marching and hiking: Secondary | ICD-10-CM | POA: Insufficient documentation

## 2018-03-02 DIAGNOSIS — J449 Chronic obstructive pulmonary disease, unspecified: Secondary | ICD-10-CM | POA: Insufficient documentation

## 2018-03-02 DIAGNOSIS — S0990XA Unspecified injury of head, initial encounter: Secondary | ICD-10-CM

## 2018-03-02 DIAGNOSIS — Z79899 Other long term (current) drug therapy: Secondary | ICD-10-CM | POA: Diagnosis not present

## 2018-03-02 DIAGNOSIS — W19XXXA Unspecified fall, initial encounter: Secondary | ICD-10-CM

## 2018-03-02 LAB — BASIC METABOLIC PANEL
Anion gap: 9 (ref 5–15)
BUN: 10 mg/dL (ref 8–23)
CO2: 22 mmol/L (ref 22–32)
CREATININE: 0.64 mg/dL (ref 0.61–1.24)
Calcium: 9.2 mg/dL (ref 8.9–10.3)
Chloride: 104 mmol/L (ref 98–111)
Glucose, Bld: 127 mg/dL — ABNORMAL HIGH (ref 70–99)
POTASSIUM: 3.8 mmol/L (ref 3.5–5.1)
SODIUM: 135 mmol/L (ref 135–145)

## 2018-03-02 LAB — CBC
HCT: 44.5 % (ref 40.0–52.0)
Hemoglobin: 15.4 g/dL (ref 13.0–18.0)
MCH: 30.4 pg (ref 26.0–34.0)
MCHC: 34.7 g/dL (ref 32.0–36.0)
MCV: 87.6 fL (ref 80.0–100.0)
PLATELETS: 207 10*3/uL (ref 150–440)
RBC: 5.08 MIL/uL (ref 4.40–5.90)
RDW: 15.3 % — AB (ref 11.5–14.5)
WBC: 4.9 10*3/uL (ref 3.8–10.6)

## 2018-03-02 LAB — TROPONIN I: Troponin I: <0.03 ng/mL (ref <0.03)

## 2018-03-02 NOTE — ED Triage Notes (Signed)
First nurse. ssays he had chest pain and went into a sweat.  In nad now.

## 2018-03-02 NOTE — ED Notes (Signed)
See triage note  States he missed his step and fell down steps  States his knee gave out   States he had some discomfort in chest after fall  NAD noted at present

## 2018-03-02 NOTE — Discharge Instructions (Addendum)
Follow-up with your regular doctor if not better in 3 to 5 days.  Return emergency department worsening.  Continue take all of your medications as previously prescribed

## 2018-03-02 NOTE — ED Provider Notes (Signed)
[facility] Emergency Department Provider Note  ____________________________________________   None    (approximate)  I have reviewed the triage vital signs and the nursing notes.   HISTORY  Chief Complaint Chest Pain and Fall    HPI Eugene Henderson is a 67 y.o. male presents to the emergency department after a fall down several steps.  He states his knee gave out and he fell onto his right side hurting his neck and hitting his head.  He states he had a sudden severe pain in his chest and was afraid he had had a heart attack.  He had some difficulty breathing but that pain has since gone away.  He states he really wanted to get checked out due to the breathing.  He denies any other injuries.    Past Medical History:  Diagnosis Date  . Arthritis   . Ascending aortic aneurysm (HCC)    a. 09/2017 Stable TAA - 5.1cm.  . Asthma   . Chronic systolic CHF (congestive heart failure) (HCC)    a. EF 25-30% by echo in 07/2016 with cath showing no significant CAD b. 01/2017: EF 30-35% with diffuse HK and moderate MR; c. 07/2017 Echo: EF 30-35%, diff hK. Mild MR, mildly dil LA. PASP .  Marland Kitchen Hypertension   . Hyperthyroidism   . NICM (nonischemic cardiomyopathy) (HCC)   . Noncompliance   . Persistent atrial fibrillation (HCC)    a. CHA2DS2VASc = 3-->Eliquis (? compliance).    Patient Active Problem List   Diagnosis Date Noted  . Chest pain 07/14/2017  . 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 Diverticulitis   . Diverticulitis of large intestine with perforation without abscess or bleeding 05/13/2017  . COPD (chronic obstructive pulmonary disease) (HCC) 02/03/2017  . 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) 12/15/2016  . AAA (abdominal aortic aneurysm) (HCC) 11/29/2016  . Chronic low back pain 11/29/2016  . Alcohol abuse 11/29/2016  . Hyponatremia 11/22/2016  . Chronic systolic heart failure (HCC) 08/19/2016  . Tobacco use 08/19/2016  . Snoring  08/19/2016  . Persistent atrial fibrillation (HCC) 07/28/2016  . Hyperthyroidism 07/28/2016  . Noncompliance with medications 07/28/2016  . Essential hypertension 07/28/2016    Past Surgical History:  Procedure Laterality Date  . JOINT REPLACEMENT Right   . RIGHT/LEFT HEART CATH AND CORONARY ANGIOGRAPHY N/A 08/02/2016   Procedure: Right/Left Heart Cath and Coronary Angiography;  Surgeon: Iran Ouch, MD;  Location: [facility] INVASIVE CV LAB;  Service: Cardiovascular;  Laterality: N/A;  . TESTICLE SURGERY     Patient states that he had to have the tube fixed.    Prior to Admission medications   Medication Sig Start Date End Date Taking? Authorizing Provider  acetaminophen (TYLENOL) 500 MG tablet Take 500 mg by mouth every 6 (six) hours as needed.    [provider]  albuterol (PROVENTIL HFA;VENTOLIN HFA) 108 (90 Base) MCG/ACT inhaler Inhale 2 puffs into the lungs every 6 (six) hours as needed for wheezing or shortness of breath. 07/05/17   Delma Freeze, FNP  budesonide-formoterol (SYMBICORT) 160-4.5 MCG/ACT inhaler Inhale 2 puffs into the lungs 2 (two) times daily. 07/05/17   Delma Freeze, FNP  cyclobenzaprine (FLEXERIL) 10 MG tablet Take 1 tablet (10 mg total) by mouth at bedtime. 05/18/17   Stoneking, Megan P, DO  Difluprednate (DUREZOL) 0.05 % EMUL Place 1 drop into the right eye every 4 (four) hours while awake.    [provider]  digoxin (LANOXIN) 0.125 MG tablet  TAKE 1 TABLET (0.125 MG TOTAL) BY MOUTH DAILY 12/02/17   Everett, Megan P, DO  ELIQUIS 5 MG TABS tablet TAKE 1 TABLET BY MOUTH TWICE A DAY 09/29/17   Whisman, Megan P, DO  feeding supplement (BOOST / RESOURCE BREEZE) LIQD Take 1 Container by mouth 2 (two) times daily between meals. 11/26/16   Delfino Lovett, MD  furosemide (LASIX) 40 MG tablet Take 1 tablet (40 mg total) by mouth daily. 07/17/17   Salary, Evelena Asa, MD  gentamicin (GARAMYCIN) 0.3 % ophthalmic solution Place 1 drop into both eyes every 4 (four)  hours. 05/06/17   Joni Reining, PA-C  methimazole (TAPAZOLE) 10 MG tablet Take 2 tablets (20 mg total) by mouth 2 (two) times daily. 05/09/17   Charo, Megan P, DO  metoprolol succinate (TOPROL-XL) 25 MG 24 hr tablet Take 1 tablet (25 mg total) by mouth daily. 07/17/17   Salary, Evelena Asa, MD  metroNIDAZOLE (FLAGYL) 500 MG tablet Take 1 tablet (500 mg total) by mouth 3 (three) times daily. 05/16/17   Lattie Haw, MD  moxifloxacin (VIGAMOX) 0.5 % ophthalmic solution Place 1 drop into the right eye every 4 (four) hours while awake.     [provider]  naphazoline-pheniramine (NAPHCON-A) 0.025-0.3 % ophthalmic solution Place 1 drop into both eyes 4 (four) times daily as needed for eye irritation. 05/06/17   Joni Reining, PA-C  potassium chloride SA (K-DUR,KLOR-CON) 20 MEQ tablet Take 1 tablet (20 mEq total) by mouth daily. 07/05/17   Delma Freeze, FNP  prednisoLONE acetate (PRED FORTE) 1 % ophthalmic suspension Place 1 drop into the right eye every 2 (two) hours while awake.     [provider]  sacubitril-valsartan (ENTRESTO) 24-26 MG Take 1 tablet by mouth 2 (two) times daily.    [provider]  spironolactone (ALDACTONE) 25 MG tablet Take 0.5 tablets (12.5 mg total) by mouth daily. 07/05/17   Delma Freeze, FNP  tobramycin (TOBREX) 0.3 % ophthalmic ointment Place into both eyes every 4 (four) hours while awake. 05/16/17   Lattie Haw, MD    Allergies Patient has no known allergies.  Family History  Problem Relation Age of Onset  . Diabetes Mother   . Diabetes Father   . Heart disease Brother   . Heart attack Brother   . Diabetes Brother   . Kidney failure Brother     Social History Social History   Tobacco Use  . Smoking status: Current Some Day Smoker    Packs/day: 0.25    Years: 25.00    Pack years: 6.25    Types: Cigarettes  . Smokeless tobacco: Never Used  Substance Use Topics  . Alcohol use: Yes    Comment: Socially - once a  month  . Drug use: No    Review of Systems  Constitutional: No fever/chills, positive for fall and head injury Eyes: No visual changes. ENT: No sore throat. Respiratory: Denies cough Cardiovascular: Positive for chest pain Genitourinary: Negative for dysuria. Musculoskeletal: Negative for back pain.  Positive for neck pain Skin: Negative for rash.    ____________________________________________   PHYSICAL EXAM:  VITAL SIGNS: ED Triage Vitals  Enc Vitals Group     BP 03/02/18 1141 (!) 156/95     Pulse Rate 03/02/18 1141 100     Resp 03/02/18 1141 18     Temp 03/02/18 1141 98.2 F (36.8 C)     Temp Source 03/02/18 1141 Oral     SpO2 03/02/18  1141 100 %     Weight 03/02/18 1142 290 lb (131.5 kg)     Height 03/02/18 1142 6' (1.829 m)     Head Circumference --      Peak Flow --      Pain Score 03/02/18 1141 7     Pain Loc --      Pain Edu? --      Excl. in GC? --     Constitutional: Alert and oriented. Well appearing and in no acute distress. Eyes: Conjunctivae are normal.  Head: Atraumatic.  No bruises or bumps are noted Nose: No congestion/rhinnorhea. Mouth/Throat: Mucous membranes are moist.   Neck:  supple no lymphadenopathy noted, positive for cervical tenderness around C5-C6 Cardiovascular: Normal rate, regular rhythm. Heart sounds are normal Respiratory: Normal respiratory effort.  No retractions, lungs c t a  Abd: soft nontender bs normal all 4 quad GU: deferred Musculoskeletal: Patient is able to move all extremities.  There is no bony tenderness noted Neurologic:  Normal speech and language.  Patient is acting appropriately and does not exhibit any neurologic deficits Skin:  Skin is warm, dry and intact. No rash noted. Psychiatric: Mood and affect are normal. Speech and behavior are normal.  ____________________________________________   LABS (all labs ordered are listed, but only abnormal results are displayed)  Labs Reviewed  BASIC METABOLIC PANEL  - Abnormal; Notable for the following components:      Result Value   Glucose, Bld 127 (*)    All other components within normal limits  CBC - Abnormal; Notable for the following components:   RDW 15.3 (*)    All other components within normal limits  TROPONIN I  TROPONIN I   ____________________________________________   ____________________________________________  RADIOLOGY  CT of the head is negative for any acute intracranial abnormality Chest x-ray shows cardiomegaly but no other acute abnormality CT of the cervical spine is negative  ____________________________________________   PROCEDURES  Procedure(s) performed: No  Procedures    ____________________________________________   INITIAL IMPRESSION / ASSESSMENT AND PLAN / ED COURSE  Pertinent labs & imaging results that were available during my care of the patient were reviewed by me and considered in my medical decision making (see chart for details).   Patient is 67 year old male who presents to the emergency department after falling down several steps.  He had chest pain during the fall.  He states he became short of breath.  He is concerned about the chest pain or shortness of breath more than the injury to his head.  Patient does take Eliquis.  On physical exam the patient appears well.  He does not have any slurred speech.  Is able to answer all questions appropriately.  The C-spine is mildly tender.  The abdomen is not tender.  No other bony tenderness is noted other than cervical spine.  EKG ordered in triage CT of the head ordered in triage is negative Chest x-ray ordered in triage is negative CT of the cervical spine ordered  Basic metabolic panel is normal, troponin I is normal at 0.03, CBC has normal WBC at 4.9  After reevaluating the patient.  A second troponin was ordered and the CT of the C-spine due to the tenderness.  CT of the C-spine is negative.  Second troponin is still  negative.  Explained the results to the patient.  Assured him he did not have a heart attack when he fell.  He is to follow-up with his regular doctor for any concerns.  Return to the emergency department if worsening.  He states he understands will comply.  He was discharged in stable condition.     As part of my medical decision making, I reviewed the following data within the electronic MEDICAL RECORD NUMBER Nursing notes reviewed and incorporated, Labs reviewed troponin negative x2, Old chart reviewed, Radiograph reviewed CT of the head and C-spine are both negative, chest x-ray is normal, Notes from prior ED visits and [place] Controlled Substance Database  ____________________________________________   FINAL CLINICAL IMPRESSION(S) / ED DIAGNOSES  Final diagnoses:  Fall, initial encounter  Injury of head, initial encounter  Chest wall pain      NEW MEDICATIONS STARTED DURING THIS VISIT:  Discharge Medication List as of 03/02/2018  4:30 PM       Note:  This document was prepared using Dragon voice recognition software and may include unintentional dictation errors.    Faythe Ghee, PA-C 03/02/18 2006    Dionne Bucy, MD 03/03/18 [phone]

## 2018-03-02 NOTE — ED Triage Notes (Signed)
Patient reports falling down three or four stairs after his knee "gave out on him". Patient states since fall, he has had pain in chest, worse when moving arms. Patient states he hit his head on the wall on the way down. Patient reports taking eloquis.

## 2018-03-13 ENCOUNTER — Encounter: Payer: Self-pay | Admitting: Emergency Medicine

## 2018-03-13 ENCOUNTER — Emergency Department
Admission: EM | Admit: 2018-03-13 | Discharge: 2018-03-13 | Disposition: A | Discharge status: Home / Self Care | Payer: Medicare HMO | Attending: Student in an Organized Health Care Education/Training Program | Admitting: Student in an Organized Health Care Education/Training Program

## 2018-03-13 ENCOUNTER — Other Ambulatory Visit: Payer: Self-pay

## 2018-03-13 DIAGNOSIS — Z9660 Presence of unspecified orthopedic joint implant: Secondary | ICD-10-CM | POA: Diagnosis not present

## 2018-03-13 DIAGNOSIS — Y929 Unspecified place or not applicable: Secondary | ICD-10-CM | POA: Diagnosis not present

## 2018-03-13 DIAGNOSIS — Z79899 Other long term (current) drug therapy: Secondary | ICD-10-CM | POA: Diagnosis not present

## 2018-03-13 DIAGNOSIS — Y999 Unspecified external cause status: Secondary | ICD-10-CM | POA: Insufficient documentation

## 2018-03-13 DIAGNOSIS — J449 Chronic obstructive pulmonary disease, unspecified: Secondary | ICD-10-CM | POA: Diagnosis not present

## 2018-03-13 DIAGNOSIS — F17210 Nicotine dependence, cigarettes, uncomplicated: Secondary | ICD-10-CM | POA: Insufficient documentation

## 2018-03-13 DIAGNOSIS — I5022 Chronic systolic (congestive) heart failure: Secondary | ICD-10-CM | POA: Insufficient documentation

## 2018-03-13 DIAGNOSIS — I110 Hypertensive heart disease with heart failure: Secondary | ICD-10-CM | POA: Diagnosis not present

## 2018-03-13 DIAGNOSIS — J45909 Unspecified asthma, uncomplicated: Secondary | ICD-10-CM | POA: Diagnosis not present

## 2018-03-13 DIAGNOSIS — J069 Acute upper respiratory infection, unspecified: Secondary | ICD-10-CM | POA: Insufficient documentation

## 2018-03-13 DIAGNOSIS — W57XXXA Bitten or stung by nonvenomous insect and other nonvenomous arthropods, initial encounter: Secondary | ICD-10-CM | POA: Insufficient documentation

## 2018-03-13 DIAGNOSIS — Z7901 Long term (current) use of anticoagulants: Secondary | ICD-10-CM | POA: Diagnosis not present

## 2018-03-13 DIAGNOSIS — R220 Localized swelling, mass and lump, head: Secondary | ICD-10-CM | POA: Diagnosis not present

## 2018-03-13 DIAGNOSIS — S50861A Insect bite (nonvenomous) of right forearm, initial encounter: Secondary | ICD-10-CM | POA: Insufficient documentation

## 2018-03-13 DIAGNOSIS — Y939 Activity, unspecified: Secondary | ICD-10-CM | POA: Diagnosis not present
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
## 2018-03-13 NOTE — ED Triage Notes (Signed)
Insect bite R forearm noted yesterday. Minimal redness noted.

## 2018-03-13 NOTE — Discharge Instructions (Addendum)
Make an appointment and follow-up with your primary care provider.  Your initial blood pressure today was 156/100.  Your doctor should recheck your blood pressure in the office to see if it continues to be elevated.  Continue taking medication as directed.  Begin using warm salt water for your throat.  You may also take Tylenol if needed for throat pain.  Increase fluids.  You may use cortisone cream or Benadryl cream to your forearm if needed for itching at the site of your insect bite.
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
## 2018-04-11 ENCOUNTER — Encounter: Payer: Self-pay | Admitting: Family Medicine

## 2018-04-11 ENCOUNTER — Ambulatory Visit (INDEPENDENT_AMBULATORY_CARE_PROVIDER_SITE_OTHER): Payer: Medicare HMO | Admitting: Family Medicine

## 2018-04-11 VITALS — BP 134/84 | HR 71 | Temp 98.6°F | Wt 258.0 lb

## 2018-04-11 DIAGNOSIS — I10 Essential (primary) hypertension: Secondary | ICD-10-CM | POA: Diagnosis not present

## 2018-04-11 DIAGNOSIS — I4819 Other persistent atrial fibrillation: Secondary | ICD-10-CM

## 2018-04-11 DIAGNOSIS — E119 Type 2 diabetes mellitus without complications: Secondary | ICD-10-CM | POA: Diagnosis not present

## 2018-04-11 DIAGNOSIS — Z23 Encounter for immunization: Secondary | ICD-10-CM | POA: Diagnosis not present

## 2018-04-11 DIAGNOSIS — I5022 Chronic systolic (congestive) heart failure: Secondary | ICD-10-CM | POA: Diagnosis not present

## 2018-04-11 DIAGNOSIS — B182 Chronic viral hepatitis C: Secondary | ICD-10-CM | POA: Diagnosis not present

## 2018-04-11 DIAGNOSIS — E0590 Thyrotoxicosis, unspecified without thyrotoxic crisis or storm: Secondary | ICD-10-CM | POA: Diagnosis not present

## 2018-04-11 DIAGNOSIS — I714 Abdominal aortic aneurysm, without rupture, unspecified: Secondary | ICD-10-CM

## 2018-04-11 DIAGNOSIS — Z125 Encounter for screening for malignant neoplasm of prostate: Secondary | ICD-10-CM

## 2018-04-11 DIAGNOSIS — J42 Unspecified chronic bronchitis: Secondary | ICD-10-CM

## 2018-04-11 DIAGNOSIS — Z720 Tobacco use: Secondary | ICD-10-CM | POA: Diagnosis not present

## 2018-04-11 DIAGNOSIS — E871 Hypo-osmolality and hyponatremia: Secondary | ICD-10-CM | POA: Diagnosis not present

## 2018-04-11 DIAGNOSIS — E1159 Type 2 diabetes mellitus with other circulatory complications: Secondary | ICD-10-CM | POA: Insufficient documentation

## 2018-04-11 LAB — UA/M W/RFLX CULTURE, ROUTINE
BILIRUBIN UA: NEGATIVE
GLUCOSE, UA: NEGATIVE
Ketones, UA: NEGATIVE
Leukocytes, UA: NEGATIVE
Nitrite, UA: NEGATIVE
PH UA: 6.5 (ref 5.0–7.5)
PROTEIN UA: NEGATIVE
RBC UA: NEGATIVE
SPEC GRAV UA: 1.01 (ref 1.005–1.030)
UUROB: 1 mg/dL (ref 0.2–1.0)

## 2018-04-11 LAB — MICROALBUMIN, URINE WAIVED
CREATININE, URINE WAIVED: 100 mg/dL (ref 10–300)
MICROALB, UR WAIVED: 30 mg/L — AB (ref 0–19)
Microalb/Creat Ratio: <30 mg/g (ref <30)

## 2018-04-11 LAB — BAYER DCA HB A1C WAIVED: HB A1C (BAYER DCA - WAIVED): 5.9 %

## 2018-04-11 MED ORDER — ATORVASTATIN CALCIUM 40 MG PO TABS: 40.0000 mg | ORAL_TABLET | Freq: Every day | ORAL | 1 refills | Status: DC

## 2018-04-11 NOTE — Assessment & Plan Note (Signed)
Rechecking levels today. Continue methimazole. Referral to endocrinology made today.

## 2018-04-11 NOTE — Assessment & Plan Note (Addendum)
Diagnosed in February, but patient denies being told. A1c today 5.9. Continue to monitor. Call with any concerns. Continue to monitor.

## 2018-04-11 NOTE — Progress Notes (Addendum)
BP 134/84   Pulse 71   Temp 98.6 F (37 C) (Oral)   Wt 258 lb (117 kg)   SpO2 98%   BMI 34.99 kg/m    Subjective:    Patient ID: Eugene Henderson, male    DOB: [birth year], 67 y.o.   MRN: [id number]  HPI: Eugene Henderson is a 67 y.o. male who presents today after being lost to follow up for 11 months. He has been to the ER 5 times during that time period. He has seen his eye doctor for a corneal infection. He has seen cardiology for A. Fib and CHF. He has not seen GI.  Chief Complaint  Patient presents with  . Hospitalization Follow-up    On exam the area is barely visible.  Patient has mild URI symptoms no cough is appreciated.  The soft tissue injury that patient spoke about is without erythema or ecchymosis.  There is no tenderness on palpation.  Patient was reassured that all areas were minimal.  Patient was discharged to follow-up with his PCP if any continued problems.  . Hypertension    Per patient per E.R. visit  . Dental Pain   ER FOLLOW UP Time since discharge: 1 month Hospital/facility: [facility] Diagnosis: insect bite, URI Procedures/tests: labs Consultants: None New medications: None Discharge instructions: Follow up here as needed  Status: better  HYPERTENSION Hypertension status: stable  Satisfied with current treatment? yes Duration of hypertension: chronic BP monitoring frequency:  not checking BP medication side effects:  no Medication compliance: fair compliance Previous BP meds: digoxin, lasix, metoprolol, entresto, spironalactone Aspirin: no Recurrent headaches: no Visual changes: no Palpitations: no Dyspnea: no Chest pain: no Lower extremity edema: no Dizzy/lightheaded: no  DENTAL PAIN Duration: 2-3 weeks Involved teeth: left and upper Dentist evaluation: no Mechanism of injury:  unknown Onset: gradual Severity: moderate Quality: aching and sharp Frequency: constant Radiation: none Aggravating factors: hard foods and chewing Alleviating  factors: nothing Status: worse Treatments attempted: NSAIDs Relief with NSAIDs?: mild Fevers: no Swelling: yes Redness: yes Paresthesias / decreased sensation: no Sinus pressure: no  HEPATITIS C Duration since diagnosis: 11 months Hep C transmission: unknown Genotype: 1a Viral load:  12,400,000  Hepatology evaluation:no Liver biopsy:no  Cirrhosis: no Antiviral therapy:no Hepatocellular carcinoma screening: no Esophageal varices screening/EGD: no Hepatitis A Vaccine: Given today Hepatitis B Vaccine: Given today Pneumovax Vaccine: Up to Date  DIABETES Hypoglycemic episodes:no Polydipsia/polyuria: no Visual disturbance: yes Chest pain: no Paresthesias: no Glucose Monitoring: no Taking Insulin?: no Blood Pressure Monitoring: not checking Retinal Examination: Up to Date Foot Exam: Done today Diabetic Education: Not Completed Pneumovax: Up to Date Influenza: Given today Aspirin: no  Relevant past medical, surgical, family and social history reviewed and updated as indicated. Interim medical history since our last visit reviewed. Allergies and medications reviewed and updated.  Review of Systems  Constitutional: Positive for fatigue. Negative for activity change, appetite change, chills, diaphoresis, fever and unexpected weight change.  HENT: Positive for dental problem and drooling. Negative for congestion, ear discharge, ear pain, facial swelling, hearing loss, mouth sores, nosebleeds, postnasal drip, rhinorrhea, sinus pressure, sinus pain, sneezing, sore throat, tinnitus, trouble swallowing and voice change.   Respiratory: Negative.   Cardiovascular: Negative.   Gastrointestinal: Negative.   Musculoskeletal: Negative.   Psychiatric/Behavioral: Negative.     Per HPI unless specifically indicated above     Objective:    BP 134/84   Pulse 71   Temp 98.6 F (37 C) (Oral)   Wt 258 lb (  117 kg)   SpO2 98%   BMI 34.99 kg/m   Wt Readings from Last 3 Encounters:    05/08/18 268 lb 3.2 oz (121.7 kg)  04/11/18 258 lb (117 kg)  03/13/18 280 lb (127 kg)    Physical Exam  Constitutional: He is oriented to person, place, and time. He appears well-developed and well-nourished. No distress.  HENT:  Head: Normocephalic and atraumatic.  Right Ear: Hearing normal.  Left Ear: Hearing normal.  Nose: Nose normal.  Eyes: Conjunctivae and lids are normal. Right eye exhibits no discharge. Left eye exhibits no discharge. No scleral icterus.  Cardiovascular: Normal rate, regular rhythm, normal heart sounds and intact distal pulses. Exam reveals no gallop and no friction rub.  No murmur heard. Pulmonary/Chest: Effort normal and breath sounds normal. No stridor. No respiratory distress. He has no wheezes. He has no rales. He exhibits no tenderness.  Abdominal: Soft. Bowel sounds are normal. He exhibits no distension and no mass. There is no tenderness. There is no rebound and no guarding. No hernia.  Musculoskeletal: Normal range of motion.  Neurological: He is alert and oriented to person, place, and time.  Skin: Skin is warm, dry and intact. Capillary refill takes less than 2 seconds. No rash noted. He is not diaphoretic. No erythema. No pallor.  Psychiatric: He has a normal mood and affect. His speech is normal and behavior is normal. Judgment and thought content normal. Cognition and memory are normal.  Nursing note and vitals reviewed.   Results for orders placed or performed in visit on 04/11/18  Bayer DCA Hb A1c Waived  Result Value Ref Range   HB A1C (BAYER DCA - WAIVED) 5.9 <7.0 %  CBC with Differential/Platelet  Result Value Ref Range   WBC 3.4 3.4 - 10.8 x10E3/uL   RBC 5.58 4.14 - 5.80 x10E6/uL   Hemoglobin 15.8 13.0 - 17.7 g/dL   Hematocrit 82.9 56.2 - 51.0 %   MCV 84 79 - 97 fL   MCH 28.3 26.6 - 33.0 pg   MCHC 33.5 31.5 - 35.7 g/dL   RDW 13.0 86.5 - 78.4 %   Platelets 177 150 - 450 x10E3/uL   Neutrophils 41 Not Estab. %   Lymphs 43 Not Estab. %    Monocytes 13 Not Estab. %   Eos 2 Not Estab. %   Basos 1 Not Estab. %   Neutrophils Absolute 1.4 1.4 - 7.0 x10E3/uL   Lymphocytes Absolute 1.5 0.7 - 3.1 x10E3/uL   Monocytes Absolute 0.4 0.1 - 0.9 x10E3/uL   EOS (ABSOLUTE) 0.1 0.0 - 0.4 x10E3/uL   Basophils Absolute 0.0 0.0 - 0.2 x10E3/uL   Immature Granulocytes 0 Not Estab. %   Immature Grans (Abs) 0.0 0.0 - 0.1 x10E3/uL  Comprehensive metabolic panel  Result Value Ref Range   Glucose 132 (H) 65 - 99 mg/dL   BUN 8 8 - 27 mg/dL   Creatinine, Ser 6.96 (L) 0.76 - 1.27 mg/dL   GFR calc non Af Amer 95 >59 mL/min/1.73   GFR calc Af Amer 110 >59 mL/min/1.73   BUN/Creatinine Ratio 11 10 - 24   Sodium 139 134 - 144 mmol/L   Potassium 4.0 3.5 - 5.2 mmol/L   Chloride 100 96 - 106 mmol/L   CO2 25 20 - 29 mmol/L   Calcium 9.1 8.6 - 10.2 mg/dL   Total Protein 7.1 6.0 - 8.5 g/dL   Albumin 4.1 3.6 - 4.8 g/dL   Globulin, Total 3.0 1.5 - 4.5 g/dL  Albumin/Globulin Ratio 1.4 1.2 - 2.2   Bilirubin Total 1.0 0.0 - 1.2 mg/dL   Alkaline Phosphatase 98 39 - 117 IU/L   AST 43 (H) 0 - 40 IU/L   ALT 40 0 - 44 IU/L  HCV RNA quant  Result Value Ref Range   Hepatitis C Quantitation See Final Results IU/mL   Test Information Comment   Lipid Panel w/o Chol/HDL Ratio  Result Value Ref Range   Cholesterol, Total 145 100 - 199 mg/dL   Triglycerides 79 0 - 149 mg/dL   HDL 80 >46 mg/dL   VLDL Cholesterol Cal 16 5 - 40 mg/dL   LDL Calculated 49 0 - 99 mg/dL  Microalbumin, Urine Waived  Result Value Ref Range   Microalb, Ur Waived 30 (H) 0 - 19 mg/L   Creatinine, Urine Waived 100 10 - 300 mg/dL   Microalb/Creat Ratio <30 <30 mg/g  PSA  Result Value Ref Range   Prostate Specific Ag, Serum 1.5 0.0 - 4.0 ng/mL  TSH  Result Value Ref Range   TSH 1.660 0.450 - 4.500 uIU/mL  UA/M w/rflx Culture, Routine  Result Value Ref Range   Specific Gravity, UA 1.010 1.005 - 1.030   pH, UA 6.5 5.0 - 7.5   Color, UA Yellow Yellow   Appearance Ur Clear Clear    Leukocytes, UA Negative Negative   Protein, UA Negative Negative/Trace   Glucose, UA Negative Negative   Ketones, UA Negative Negative   RBC, UA Negative Negative   Bilirubin, UA Negative Negative   Urobilinogen, Ur 1.0 0.2 - 1.0 mg/dL   Nitrite, UA Negative Negative  HCV RNA (International Units)  Result Value Ref Range   HCV RNA (International Units) 20,300,000 IU/mL   HCV log10 7.307 log10 IU/mL      Assessment & Plan:   Problem List Items Addressed This Visit      Cardiovascular and Mediastinum   Chronic systolic heart failure (HCC) - Primary (Chronic)    Euvolemic today. Continue to follow with cardiology and CHF clinic. Call with any concerns. Continue medication.      Relevant Medications   atorvastatin (LIPITOR) 40 MG tablet   Other Relevant Orders   CBC with Differential/Platelet (Completed)   Comprehensive metabolic panel (Completed)   UA/M w/rflx Culture, Routine (Completed)   Persistent atrial fibrillation    Continues to follow with cardiology. Stable on eliquis and metoprolol. Continue current regimen. Continue to monitor. Call with any concerns.       Relevant Medications   atorvastatin (LIPITOR) 40 MG tablet   Other Relevant Orders   CBC with Differential/Platelet (Completed)   Comprehensive metabolic panel (Completed)   UA/M w/rflx Culture, Routine (Completed)   Essential hypertension    Under good control on current regimen. Continue current regimen. Continue to monitor. Call with any concerns. Refills given. Checking labs today.       Relevant Medications   atorvastatin (LIPITOR) 40 MG tablet   Other Relevant Orders   CBC with Differential/Platelet (Completed)   Comprehensive metabolic panel (Completed)   Microalbumin, Urine Waived (Completed)   UA/M w/rflx Culture, Routine (Completed)   AAA (abdominal aortic aneurysm) (HCC)    Overdue for follow up on his [place]. Ordered today. Keep BP and cholesterol under good control. Call with any concerns.         Relevant Medications   atorvastatin (LIPITOR) 40 MG tablet   Other Relevant Orders   US Aorta/IVC/[employer]op [employer]     Respiratory  COPD (chronic obstructive pulmonary disease) (HCC)    Lungs clear today. Continue inhalers. Call with any concerns.       Relevant Orders   CBC with Differential/Platelet (Completed)   Comprehensive metabolic panel (Completed)   UA/M w/rflx Culture, Routine (Completed)     Digestive   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)    Has not seen GI. Rechecking viral load today. Referral to GI made today. Hep B and Hep A #1 given today. Call with any concerns.       Relevant Orders   CBC with Differential/Platelet (Completed)   Comprehensive metabolic panel (Completed)   HCV RNA quant (Completed)   UA/M w/rflx Culture, Routine (Completed)   Ambulatory referral to Gastroenterology   Hepatitis B vaccine adult IM (Completed)   Hepatitis A vaccine adult IM (Completed)     Endocrine   Hyperthyroidism    Rechecking levels today. Continue methimazole. Referral to endocrinology made today.      Relevant Orders   CBC with Differential/Platelet (Completed)   Comprehensive metabolic panel (Completed)   TSH (Completed)   UA/M w/rflx Culture, Routine (Completed)   Ambulatory referral to Endocrinology   Diabetes mellitus without complication (HCC)    Diagnosed in February, but patient denies being told. A1c today 5.9. Continue to monitor. Call with any concerns. Continue to monitor.       Relevant Medications   atorvastatin (LIPITOR) 40 MG tablet   Other Relevant Orders   Bayer DCA Hb A1c Waived (Completed)   CBC with Differential/Platelet (Completed)   Comprehensive metabolic panel (Completed)   Lipid Panel w/o Chol/HDL Ratio (Completed)   Microalbumin, Urine Waived (Completed)   UA/M w/rflx Culture, Routine (Completed)   Pneumococcal conjugate vaccine 13-valent (Completed)     Other   Tobacco use (Chronic)    Encouraged patient to quit-  not interested at that time. Call with any concerns.       Relevant Orders   CBC with Differential/Platelet (Completed)   Comprehensive metabolic panel (Completed)   UA/M w/rflx Culture, Routine (Completed)   Hyponatremia    Rechecking levels today. Await results.       Relevant Orders   CBC with Differential/Platelet (Completed)   Comprehensive metabolic panel (Completed)   UA/M w/rflx Culture, Routine (Completed)    Other Visit Diagnoses    Screening for prostate cancer       Labs drawn today. Await results.    Relevant Orders   PSA (Completed)   Needs flu shot       Flu shot given today.   Relevant Orders   Flu vaccine HIGH DOSE PF (Fluzone High dose) (Completed)       Follow up plan: Return in about 4 weeks (around 05/09/2018) for Wellness/Physical.

## 2018-04-11 NOTE — Assessment & Plan Note (Signed)
Rechecking levels today. Await results.  

## 2018-04-11 NOTE — Assessment & Plan Note (Signed)
Lungs clear today. Continue inhalers. Call with any concerns. 

## 2018-04-11 NOTE — Assessment & Plan Note (Signed)
Under good control on current regimen. Continue current regimen. Continue to monitor. Call with any concerns. Refills given. Checking labs today.  

## 2018-04-11 NOTE — Assessment & Plan Note (Signed)
Has not seen GI. Rechecking viral load today. Referral to GI made today. Hep B and Hep A #1 given today. Call with any concerns.

## 2018-04-11 NOTE — Assessment & Plan Note (Signed)
Euvolemic today. Continue to follow with cardiology and CHF clinic. Call with any concerns. Continue medication.

## 2018-04-11 NOTE — Assessment & Plan Note (Signed)
Encouraged patient to quit- not interested at that time. Call with any concerns.

## 2018-04-11 NOTE — Assessment & Plan Note (Signed)
Continues to follow with cardiology. Stable on eliquis and metoprolol. Continue current regimen. Continue to monitor. Call with any concerns.

## 2018-04-11 NOTE — Assessment & Plan Note (Signed)
Overdue for follow up on his [place]. Ordered today. Keep BP and cholesterol under good control. Call with any concerns.

## 2018-04-13 LAB — CBC WITH DIFFERENTIAL/PLATELET
BASOS: 1 %
Basophils Absolute: 0 10*3/uL (ref 0.0–0.2)
EOS (ABSOLUTE): 0.1 10*3/uL (ref 0.0–0.4)
Eos: 2 %
Hematocrit: 47.1 % (ref 37.5–51.0)
Hemoglobin: 15.8 g/dL (ref 13.0–17.7)
Immature Grans (Abs): 0 10*3/uL (ref 0.0–0.1)
Immature Granulocytes: 0 %
Lymphocytes Absolute: 1.5 10*3/uL (ref 0.7–3.1)
Lymphs: 43 %
MCH: 28.3 pg (ref 26.6–33.0)
MCHC: 33.5 g/dL (ref 31.5–35.7)
MCV: 84 fL (ref 79–97)
MONOS ABS: 0.4 10*3/uL (ref 0.1–0.9)
Monocytes: 13 %
NEUTROS ABS: 1.4 10*3/uL (ref 1.4–7.0)
Neutrophils: 41 %
PLATELETS: 177 10*3/uL (ref 150–450)
RBC: 5.58 x10E6/uL (ref 4.14–5.80)
RDW: 14.6 % (ref 12.3–15.4)
WBC: 3.4 10*3/uL (ref 3.4–10.8)

## 2018-04-13 LAB — COMPREHENSIVE METABOLIC PANEL
A/G RATIO: 1.4 (ref 1.2–2.2)
ALT: 40 IU/L (ref 0–44)
AST: 43 IU/L — AB (ref 0–40)
Albumin: 4.1 g/dL (ref 3.6–4.8)
Alkaline Phosphatase: 98 IU/L (ref 39–117)
BUN/Creatinine Ratio: 11 (ref 10–24)
BUN: 8 mg/dL (ref 8–27)
Bilirubin Total: 1 mg/dL (ref 0.0–1.2)
CHLORIDE: 100 mmol/L (ref 96–106)
CO2: 25 mmol/L (ref 20–29)
Calcium: 9.1 mg/dL (ref 8.6–10.2)
Creatinine, Ser: 0.75 mg/dL — ABNORMAL LOW (ref 0.76–1.27)
GFR calc non Af Amer: 95 mL/min/{1.73_m2} (ref >59)
GFR, EST AFRICAN AMERICAN: 110 mL/min/{1.73_m2} (ref >59)
Globulin, Total: 3 g/dL (ref 1.5–4.5)
Glucose: 132 mg/dL — ABNORMAL HIGH (ref 65–99)
POTASSIUM: 4 mmol/L (ref 3.5–5.2)
Sodium: 139 mmol/L (ref 134–144)
TOTAL PROTEIN: 7.1 g/dL (ref 6.0–8.5)

## 2018-04-13 LAB — LIPID PANEL W/O CHOL/HDL RATIO
Cholesterol, Total: 145 mg/dL (ref 100–199)
HDL: 80 mg/dL (ref >39)
LDL Calculated: 49 mg/dL (ref 0–99)
Triglycerides: 79 mg/dL (ref 0–149)
VLDL CHOLESTEROL CAL: 16 mg/dL (ref 5–40)

## 2018-04-13 LAB — HCV RNA (INTERNATIONAL UNITS)
HCV LOG10: 7.307 {Log_IU}/mL
HCV RNA (International Units): 20300000 IU/mL

## 2018-04-13 LAB — TSH: TSH: 1.66 u[IU]/mL (ref 0.450–4.500)

## 2018-04-13 LAB — HCV RNA QUANT

## 2018-04-13 LAB — PSA: PROSTATE SPECIFIC AG, SERUM: 1.5 ng/mL (ref 0.0–4.0)

## 2018-04-17 DIAGNOSIS — R69 Illness, unspecified: Secondary | ICD-10-CM | POA: Diagnosis not present

## 2018-04-19 ENCOUNTER — Telehealth: Payer: Self-pay | Admitting: Family Medicine

## 2018-04-19 NOTE — Telephone Encounter (Signed)
Received a call regarding the order for patients scan for his aorta.  They are changing the CPT code changed so they are putting in the correct code and will be sending over a new order for you to sign.  Thanks

## 2018-04-19 NOTE — Telephone Encounter (Signed)
New orders signed.

## 2018-04-20 ENCOUNTER — Ambulatory Visit: Admission: RE | Admit: 2018-04-20 | Payer: Medicare HMO | Source: Ambulatory Visit

## 2018-05-08 ENCOUNTER — Other Ambulatory Visit
Admission: RE | Admit: 2018-05-08 | Discharge: 2018-05-08 | Disposition: A | Discharge status: Home / Self Care | Payer: Medicare HMO | Source: Ambulatory Visit | Attending: Gastroenterology | Admitting: Gastroenterology

## 2018-05-08 ENCOUNTER — Ambulatory Visit (INDEPENDENT_AMBULATORY_CARE_PROVIDER_SITE_OTHER): Payer: Medicare HMO | Admitting: Gastroenterology

## 2018-05-08 ENCOUNTER — Encounter: Payer: Self-pay | Admitting: Gastroenterology

## 2018-05-08 VITALS — BP 156/100 | HR 87 | Ht 72.0 in | Wt 268.2 lb

## 2018-05-08 DIAGNOSIS — B182 Chronic viral hepatitis C: Secondary | ICD-10-CM | POA: Diagnosis not present

## 2018-05-08 NOTE — Progress Notes (Signed)
Gastroenterology Consultation  Referring Provider:     Dorcas Carrow, DO Primary Care Physician:  Dorcas Carrow, DO Primary Gastroenterologist:  Dr. Servando Snare     Reason for Consultation:     Hepatitis C        HPI:   Eugene Henderson is a 67 y.o. y/o male referred for consultation & management of hepatitis C by Dr. Laural Benes, Megan P, DO.  This patient comes in today after being found approximately a year ago to have hepatitis C.  The patient has not followed up since his diagnosis with GI.  The patient was also reported to have genotype 1a a fibrosis scan that could not determine his fibrosis score but did report:  IMPRESSION: ULTRASOUND ABDOMEN: Hyperechoic hepatic parenchyma with coarse hepatic echotexture and mildly nodular hepatic contour. This appearance is nonspecific but may reflect hepatic steatosis and/or early hepatocellular disease.  The patient has had blood work that showed his liver enzymes since December 2018 with the AST always being higher than the ALT.  The patient was also administered vaccinations by his primary care provider.  The patient was found to have a hepatitis C genotype 1a.  The patient reports that he has used IV drugs in the past 20 years ago.  He also reports that there has been a history of high risk sexual activity.  The patient also had 2 teeth pulled this morning.   Past Medical History:  Diagnosis Date  . Arthritis   . Ascending aortic aneurysm (HCC)    a. 09/2017 Stable TAA - 5.1cm.  . Asthma   . Chronic systolic CHF (congestive heart failure) (HCC)    a. EF 25-30% by echo in 07/2016 with cath showing no significant CAD b. 01/2017: EF 30-35% with diffuse HK and moderate MR; c. 07/2017 Echo: EF 30-35%, diff hK. Mild MR, mildly dil LA. PASP .  . Diverticulitis   . Diverticulitis of large intestine with perforation without abscess or bleeding 05/13/2017  . Hypertension   . Hyperthyroidism   . NICM (nonischemic cardiomyopathy) (HCC)   .  Noncompliance   . Persistent atrial fibrillation    a. CHA2DS2VASc = 3-->Eliquis (? compliance).    Past Surgical History:  Procedure Laterality Date  . JOINT REPLACEMENT Right   . RIGHT/LEFT HEART CATH AND CORONARY ANGIOGRAPHY N/A 08/02/2016   Procedure: Right/Left Heart Cath and Coronary Angiography;  Surgeon: Iran Ouch, MD;  Location: [facility] INVASIVE CV LAB;  Service: Cardiovascular;  Laterality: N/A;  . TESTICLE SURGERY     Patient states that he had to have the tube fixed.    Prior to Admission medications   Medication Sig Start Date End Date Taking? Authorizing Provider  acetaminophen (TYLENOL) 500 MG tablet Take 500 mg by mouth every 6 (six) hours as needed.    [provider]  albuterol (PROVENTIL HFA;VENTOLIN HFA) 108 (90 Base) MCG/ACT inhaler Inhale 2 puffs into the lungs every 6 (six) hours as needed for wheezing or shortness of breath. 07/05/17   Delma Freeze, FNP  atorvastatin (LIPITOR) 40 MG tablet Take 1 tablet (40 mg total) by mouth daily. 04/11/18   Mand[place], Megan P, DO  budesonide-formoterol (SYMBICORT) 160-4.5 MCG/ACT inhaler Inhale 2 puffs into the lungs 2 (two) times daily. 07/05/17   Delma Freeze, FNP  Difluprednate (DUREZOL) 0.05 % EMUL Place 1 drop into the right eye every 4 (four) hours while awake.    [provider]  digoxin (LANOXIN) 0.125 MG tablet TAKE 1 TABLET (  0.125 MG TOTAL) BY MOUTH DAILY 12/02/17   Schussler, Megan P, DO  ELIQUIS 5 MG TABS tablet TAKE 1 TABLET BY MOUTH TWICE A DAY 09/29/17   Vowles, Megan P, DO  feeding supplement (BOOST / RESOURCE BREEZE) LIQD Take 1 Container by mouth 2 (two) times daily between meals. 11/26/16   Delfino Lovett, MD  gentamicin (GARAMYCIN) 0.3 % ophthalmic solution Place 1 drop into both eyes every 4 (four) hours. 05/06/17   Joni Reining, PA-C  methimazole (TAPAZOLE) 10 MG tablet Take 2 tablets (20 mg total) by mouth 2 (two) times daily. 05/09/17   Brim, Megan P, DO  metoprolol succinate (TOPROL-XL)  25 MG 24 hr tablet Take 1 tablet (25 mg total) by mouth daily. 07/17/17   Salary, Evelena Asa, MD  moxifloxacin (VIGAMOX) 0.5 % ophthalmic solution Place 1 drop into the right eye every 4 (four) hours while awake.     [provider]  naphazoline-pheniramine (NAPHCON-A) 0.025-0.3 % ophthalmic solution Place 1 drop into both eyes 4 (four) times daily as needed for eye irritation. 05/06/17   Joni Reining, PA-C  potassium chloride SA (K-DUR,KLOR-CON) 20 MEQ tablet Take 1 tablet (20 mEq total) by mouth daily. 07/05/17   Delma Freeze, FNP  prednisoLONE acetate (PRED FORTE) 1 % ophthalmic suspension Place 1 drop into the right eye every 2 (two) hours while awake.     [provider]  sacubitril-valsartan (ENTRESTO) 24-26 MG Take 1 tablet by mouth 2 (two) times daily.    [provider]  spironolactone (ALDACTONE) 25 MG tablet Take 0.5 tablets (12.5 mg total) by mouth daily. 07/05/17   Delma Freeze, FNP    Family History  Problem Relation Age of Onset  . Diabetes Mother   . Diabetes Father   . Heart disease Brother   . Heart attack Brother   . Diabetes Brother   . Kidney failure Brother      Social History   Tobacco Use  . Smoking status: Current Some Day Smoker    Packs/day: 0.25    Years: 25.00    Pack years: 6.25    Types: Cigarettes  . Smokeless tobacco: Never Used  Substance Use Topics  . Alcohol use: Yes    Comment: Socially - once a month  . Drug use: No    Allergies as of 05/08/2018  . (No Known Allergies)    Review of Systems:    All systems reviewed and negative except where noted in HPI.   Physical Exam:  There were no vitals taken for this visit. No LMP for male patient. General:   Alert,  Well-developed, well-nourished, pleasant and cooperative in NAD Head:  Normocephalic and atraumatic. Eyes:  Sclera clear, no icterus.   Conjunctiva pink. Ears:  Normal auditory acuity. Rectal:  Deferred.  Msk:  Symmetrical without gross  deformities.  Good, equal movement & strength bilaterally. Pulses:  Normal pulses noted. Extremities:  No clubbing or edema.  No cyanosis. Neurologic:  Alert and oriented x3;  grossly normal neurologically. Skin:  Intact without significant lesions or rashes.  No jaundice. Lymph Nodes:  No significant cervical adenopathy. Psych:  Alert and cooperative. Normal mood and affect.  Imaging Studies: No results found.  Assessment and Plan:   Eugene Henderson is a 67 y.o. y/o male who has a history of hepatitis C with genotype 1a and a viral load of over 20,000,000 international units/mL.  The patient reports that he drinks approximately two 12 packs a week of  beer.  The patient also reports that he can stop this during his treatment.  The patient will have any remaining labs sent off prior to starting treatment.  The patient has been told that he will have to commit to no further alcohol use during treatment.  The patient agrees with this.  The patient will be started on treatment soon as all of his labs are back and the medication has been ordered.  The patient understands the plan and agrees with it.  Midge Minium, MD. Clementeen Graham    Note: This dictation was prepared with Dragon dictation along with smaller phrase technology. Any transcriptional errors that result from this process are unintentional.

## 2018-05-10 ENCOUNTER — Telehealth: Payer: Self-pay

## 2018-05-10 LAB — HCV FIBROSURE
ALPHA 2-MACROGLOBULINS, QN: 207 mg/dL (ref 110–276)
ALT (SGPT) P5P: 36 IU/L (ref 0–55)
APOLIPOPROTEIN A I: 171 mg/dL (ref 101–178)
Bilirubin, Total: 0.9 mg/dL (ref 0.0–1.2)
FIBROSIS SCORE: 0.35 — AB (ref 0.00–0.21)
GGT: 36 IU/L (ref 0–65)
HAPTOGLOBIN: 169 mg/dL (ref 34–200)
NECROINFLAMMAT ACTIVITY SCORE: 0.2 — AB (ref 0.00–0.17)

## 2018-05-10 NOTE — Telephone Encounter (Signed)
-----   Message from Midge Minium, MD sent at 05/10/2018  9:43 AM EST ----- Let the patient know that there was no significant damage to his liver thus far from the hepatitis C and he should be treated for the infection.

## 2018-05-10 NOTE — Telephone Encounter (Signed)
Pt's daughter notified of results. Paperwork completed and faxed to [employer].

## 2018-05-16 ENCOUNTER — Ambulatory Visit (INDEPENDENT_AMBULATORY_CARE_PROVIDER_SITE_OTHER): Payer: Medicare HMO | Admitting: Family Medicine

## 2018-05-16 ENCOUNTER — Ambulatory Visit: Payer: Medicare HMO | Admitting: Endocrinology

## 2018-05-16 ENCOUNTER — Encounter: Payer: Self-pay | Admitting: Family Medicine

## 2018-05-16 VITALS — HR 72 | Temp 98.4°F | Ht 72.0 in | Wt 259.0 lb

## 2018-05-16 DIAGNOSIS — E0590 Thyrotoxicosis, unspecified without thyrotoxic crisis or storm: Secondary | ICD-10-CM | POA: Diagnosis not present

## 2018-05-16 DIAGNOSIS — Z23 Encounter for immunization: Secondary | ICD-10-CM | POA: Diagnosis not present

## 2018-05-16 DIAGNOSIS — H169 Unspecified keratitis: Secondary | ICD-10-CM

## 2018-05-16 DIAGNOSIS — B182 Chronic viral hepatitis C: Secondary | ICD-10-CM

## 2018-05-16 DIAGNOSIS — I714 Abdominal aortic aneurysm, without rupture, unspecified: Secondary | ICD-10-CM

## 2018-05-16 DIAGNOSIS — Z0000 Encounter for general adult medical examination without abnormal findings: Secondary | ICD-10-CM | POA: Diagnosis not present

## 2018-05-16 DIAGNOSIS — Z7189 Other specified counseling: Secondary | ICD-10-CM | POA: Diagnosis not present

## 2018-05-16 NOTE — Patient Instructions (Addendum)
Thyroid doctor: Dr. Everardo All Wednesday 05/24/18 [address] [place], [place] [address]  [phone]  Eye Doctor- Wed. 07/05/18 2:45 PM [address] [place], [place] [address]  [phone]  Aortic Ultrasound-  05/23/18 10:15AM [facility] [address] [place], [place], [place] [address] [phone]  Preventative Services:  AAA Screening: Due Health Risk Assessment and Personalized Prevention Plan: Done today Bone Mass Measurements: N/A CVD Screening: Done last visit Colon Cancer Screening: Unsure when last one was done- possibly 3-4 years ago Depression Screening: Done today Diabetes Screening: UTD Glaucoma Screening: See your Eye Doctor Hepatitis B vaccine: Done today Hepatitis C screening: UTD HIV Screening: UTD Flu Vaccine: UTD Lung cancer Screening: Up to date Obesity Screening: Done today Pneumonia Vaccines (2): UTD STI Screening: N/A PSA screening: UTD  Hepatitis B Vaccine: What You Need to Know 1. Why get vaccinated? Hepatitis B is a serious disease that affects the liver. It is caused by the hepatitis B virus. Hepatitis B can cause mild illness lasting a few weeks, or it can lead to a serious, lifelong illness. Hepatitis B virus infection can be either acute or chronic. Acute hepatitis B virus infection is a short-term illness that occurs within the first 6 months after someone is exposed to the hepatitis B virus. This can lead to:  fever, fatigue, loss of appetite, nausea, and/or vomiting  jaundice (yellow skin or eyes, dark urine, clay-colored bowel movements)  pain in muscles, joints, and stomach  Chronic hepatitis B virus infection is a long-term illness that occurs when the hepatitis B virus remains in a person's body. Most people who go on to develop chronic hepatitis B do not have symptoms, but it is still very serious and can lead to:  liver damage (cirrhosis)  liver cancer  death  Chronically-infected people can spread  hepatitis B virus to others, even if they do not feel or look sick themselves. Up to 1.4 million people in the [place] may have chronic hepatitis B infection. About 90% of infants who get hepatitis B become chronically infected and about 1 out of 4 of them dies. Hepatitis B is spread when blood, semen, or other body fluid infected with the Hepatitis B virus enters the body of a person who is not infected. People can become infected with the virus through:  Birth (a baby whose mother is infected can be infected at or after birth)  Sharing items such as razors or toothbrushes with an infected person  Contact with the blood or open sores of an infected person  Sex with an infected partner  Sharing needles, syringes, or other drug-injection equipment  Exposure to blood from needlesticks or other sharp instruments  Each year about 2,000 people in the [place] die from hepatitis B-related liver disease. Hepatitis B vaccine can prevent hepatitis B and its consequences, including liver cancer and cirrhosis. 2. Hepatitis B vaccine Hepatitis B vaccine is made from parts of the hepatitis B virus. It cannot cause hepatitis B infection. The vaccine is usually given as 3 or 4 shots over a 69-month period. Infants should get their first dose of hepatitis B vaccine at birth and will usually complete the series at 33 months of age. All children and adolescents younger than 7 years of age who have not yet gotten the vaccine should also be vaccinated. Hepatitis B vaccine is recommended for unvaccinated adults who are at risk for hepatitis B virus infection, including:  People whose sex partners have hepatitis B  Sexually active persons who are not in a long-term monogamous relationship  Persons seeking evaluation or treatment for a sexually transmitted disease  Men who have sexual contact with other men  People who share needles, syringes, or other drug-injection equipment  People who have  household contact with someone infected with the hepatitis B virus  Health care and public safety workers at risk for exposure to blood or body fluids  Residents and staff of facilities for developmentally disabled persons  Persons in correctional facilities  Victims of sexual assault or abuse  Travelers to regions with increased rates of hepatitis B  People with chronic liver disease, kidney disease, HIV infection, or diabetes  Anyone who wants to be protected from hepatitis B  There are no known risks to getting hepatitis B vaccine at the same time as other vaccines. 3. Some people should not get this vaccine Tell the person who is giving the vaccine:  If the person getting the vaccine has any severe, life-threatening allergies.  If you ever had a life-threatening allergic reaction after a dose of hepatitis B vaccine, or have a severe allergy to any part of this vaccine, you may be advised not to get vaccinated. Ask your health care provider if you want information about vaccine components.  If the person getting the vaccine is not feeling well.  If you have a mild illness, such as a cold, you can probably get the vaccine today. If you are moderately or severely ill, you should probably wait until you recover. Your doctor can advise you.  4. Risks of a vaccine reaction With any medicine, including vaccines, there is a chance of side effects. These are usually mild and go away on their own, but serious reactions are also possible. Most people who get hepatitis B vaccine do not have any problems with it. Minor problems following hepatitis B vaccine include:  soreness where the shot was given  temperature of 99.69F or higher  If these problems occur, they usually begin soon after the shot and last 1 or 2 days. Your doctor can tell you more about these reactions. Other problems that could happen after this vaccine:  People sometimes faint after a medical procedure, including  vaccination. Sitting or lying down for about 15 minutes can help prevent fainting and injuries caused by a fall. Tell your provider if you feel dizzy, or have vision changes or ringing in the ears.  Some people get shoulder pain that can be more severe and longer-lasting than the more routine soreness that can follow injections. This happens very rarely.  Any medication can cause a severe allergic reaction. Such reactions from a vaccine are very rare, estimated at about 1 in a million doses, and would happen within a few minutes to a few hours after the vaccination. As with any medicine, there is a very remote chance of a vaccine causing a serious injury or death. The safety of vaccines is always being monitored. For more information, visit: http://floyd.org/ 5. What if there is a serious problem? What should I look for? Look for anything that concerns you, such as signs of a severe allergic reaction, very high fever, or unusual behavior. Signs of a severe allergic reaction can include hives, swelling of the face and throat, difficulty breathing, a fast heartbeat, dizziness, and weakness. These would start a few minutes to a few hours after the vaccination. What should I do?  If you think it is a severe allergic reaction or other emergency that  can't wait, call 9-1-1 or get to the nearest hospital. Otherwise, call your clinic.  Afterward, the reaction should be reported to the Vaccine Adverse Event Reporting System (VAERS). Your doctor should file this report, or you can do it yourself through the VAERS web site at www.vaers.LAgents.no, or by calling 1-(630)359-5247. ? VAERS does not give medical advice. 6. The National Vaccine Injury Compensation Program The [employer] Vaccine Injury Compensation Program (VICP) is a federal program that was created to compensate people who may have been injured by certain vaccines. Persons who believe they may have been injured by a vaccine can learn about the  program and about filing a claim by calling 1-518-762-8112 or visiting the VICP website at SpiritualWord.at. There is a time limit to file a claim for compensation. 7. How can I learn more?  Ask your healthcare provider. He or she can give you the vaccine package insert or suggest other sources of information.  Call your local or state health department.  Contact the Centers for Disease Control and Prevention (CDC): ? Call 680-794-8509 (1-800-CDC-INFO) or ? Visit CDC's website at PicCapture.uy CDC Hepatitis B VIS (12/25/2014) This information is not intended to replace advice given to you by your health care provider. Make sure you discuss any questions you have with your health care provider. Document Released: 03/18/2006 Document Revised: 02/12/2016 Document Reviewed: 02/12/2016 Elsevier Interactive Patient Education  2017 Elsevier Inc.  Health Maintenance, Male A healthy lifestyle and preventive care is important for your health and wellness. Ask your health care provider about what schedule of regular examinations is right for you. What should I know about weight and diet? Eat a Healthy Diet  Eat plenty of vegetables, fruits, whole grains, low-fat dairy products, and lean protein.  Do not eat a lot of foods high in solid fats, added sugars, or salt.  Maintain a Healthy Weight Regular exercise can help you achieve or maintain a healthy weight. You should:  Do at least 150 minutes of exercise each week. The exercise should increase your heart rate and make you sweat (moderate-intensity exercise).  Do strength-training exercises at least twice a week.  Watch Your Levels of Cholesterol and Blood Lipids  Have your blood tested for lipids and cholesterol every 5 years starting at 67 years of age. If you are at high risk for heart disease, you should start having your blood tested when you are 67 years old. You may need to have your cholesterol levels checked  more often if: ? Your lipid or cholesterol levels are high. ? You are older than 67 years of age. ? You are at high risk for heart disease.  What should I know about cancer screening? Many types of cancers can be detected early and may often be prevented. Lung Cancer  You should be screened every year for lung cancer if: ? You are a current smoker who has smoked for at least 30 years. ? You are a former smoker who has quit within the past 15 years.  Talk to your health care provider about your screening options, when you should start screening, and how often you should be screened.  Colorectal Cancer  Routine colorectal cancer screening usually begins at 67 years of age and should be repeated every 5-10 years until you are 67 years old. You may need to be screened more often if early forms of precancerous polyps or small growths are found. Your health care provider may recommend screening at an earlier age if you have risk  factors for colon cancer.  Your health care provider may recommend using home test kits to check for hidden blood in the stool.  A small camera at the end of a tube can be used to examine your colon (sigmoidoscopy or colonoscopy). This checks for the earliest forms of colorectal cancer.  Prostate and Testicular Cancer  Depending on your age and overall health, your health care provider may do certain tests to screen for prostate and testicular cancer.  Talk to your health care provider about any symptoms or concerns you have about testicular or prostate cancer.  Skin Cancer  Check your skin from head to toe regularly.  Tell your health care provider about any new moles or changes in moles, especially if: ? There is a change in a mole's size, shape, or color. ? You have a mole that is larger than a pencil eraser.  Always use sunscreen. Apply sunscreen liberally and repeat throughout the day.  Protect yourself by wearing long sleeves, pants, a wide-brimmed hat,  and sunglasses when outside.  What should I know about heart disease, diabetes, and high blood pressure?  If you are 93-70 years of age, have your blood pressure checked every 3-5 years. If you are 8 years of age or older, have your blood pressure checked every year. You should have your blood pressure measured twice-once when you are at a hospital or clinic, and once when you are not at a hospital or clinic. Record the average of the two measurements. To check your blood pressure when you are not at a hospital or clinic, you can use: ? An automated blood pressure machine at a pharmacy. ? A home blood pressure monitor.  Talk to your health care provider about your target blood pressure.  If you are between 29-49 years old, ask your health care provider if you should take aspirin to prevent heart disease.  Have regular diabetes screenings by checking your fasting blood sugar level. ? If you are at a normal weight and have a low risk for diabetes, have this test once every three years after the age of 41. ? If you are overweight and have a high risk for diabetes, consider being tested at a younger age or more often.  A one-time screening for abdominal aortic aneurysm (AAA) by ultrasound is recommended for men aged 65-75 years who are current or former smokers. What should I know about preventing infection? Hepatitis B If you have a higher risk for hepatitis B, you should be screened for this virus. Talk with your health care provider to find out if you are at risk for hepatitis B infection. Hepatitis C Blood testing is recommended for:  Everyone born from [birth year] through 1965.  Anyone with known risk factors for hepatitis C.  Sexually Transmitted Diseases (STDs)  You should be screened each year for STDs including gonorrhea and chlamydia if: ? You are sexually active and are younger than 67 years of age. ? You are older than 67 years of age and your health care provider tells you that you  are at risk for this type of infection. ? Your sexual activity has changed since you were last screened and you are at an increased risk for chlamydia or gonorrhea. Ask your health care provider if you are at risk.  Talk with your health care provider about whether you are at high risk of being infected with HIV. Your health care provider may recommend a prescription medicine to help prevent HIV infection.  What else can I do?  Schedule regular health, dental, and eye exams.  Stay current with your vaccines (immunizations).  Do not use any tobacco products, such as cigarettes, chewing tobacco, and e-cigarettes. If you need help quitting, ask your health care provider.  Limit alcohol intake to no more than 2 drinks per day. One drink equals 12 ounces of beer, 5 ounces of wine, or 1 ounces of hard liquor.  Do not use street drugs.  Do not share needles.  Ask your health care provider for help if you need support or information about quitting drugs.  Tell your health care provider if you often feel depressed.  Tell your health care provider if you have ever been abused or do not feel safe at home. This information is not intended to replace advice given to you by your health care provider. Make sure you discuss any questions you have with your health care provider. Document Released: 11/20/2007 Document Revised: 01/21/2016 Document Reviewed: 02/25/2015 Elsevier Interactive Patient Education  [employer].

## 2018-05-16 NOTE — Assessment & Plan Note (Signed)
A voluntary discussion about advance care planning including the explanation and discussion of advance directives was extensively discussed  with the patient for 5 minutes with patient.  Explanation about the health care proxy and Living will was reviewed and packet with forms with explanation of how to fill them out was given.  During this discussion, the patient was not able to identify a health care proxy and plans fill out the paperwork required.  Patient was offered a separate Advance Care Planning visit for further assistance with forms.

## 2018-05-16 NOTE — Assessment & Plan Note (Signed)
Due to see endocrinology next week. TSH stable last visit. Continue methimazole. Call with any concerns.

## 2018-05-16 NOTE — Assessment & Plan Note (Signed)
Will get him back in to see eye doctor. Appointment scheduled.

## 2018-05-16 NOTE — Assessment & Plan Note (Signed)
Due for repeat. No-showed his appointment. Rescheduled today.

## 2018-05-16 NOTE — Progress Notes (Signed)
Pulse 72   Temp 98.4 F (36.9 C) (Oral)   Ht 6' (1.829 m)   Wt 259 lb (117.5 kg)   SpO2 98%   BMI 35.13 kg/m    Subjective:    Patient ID: Eugene Henderson, male    DOB: 09/10/1950, 67 y.o.   MRN: [id number]  HPI: Dewel Lotter is a 67 y.o. male presenting on 05/16/2018 for comprehensive medical examination. Current medical complaints include:  Hyperthyroidism- to see endocrinology in later this month. Thyroid normal on last check. Stable on his methimazole.   Saw GI last week for his Hep C- to start treatment shortly.  Chronic eye infections- had been seeing [facility]- has not been back to see them. Wants a patch.  He currently lives with: daughter and several of daughter's friend Interim Problems from his last visit: no  Functional Status Survey: Is the patient deaf or have difficulty hearing?: No Does the patient have difficulty seeing, even when wearing glasses/contacts?: Yes Does the patient have difficulty concentrating, remembering, or making decisions?: Yes Does the patient have difficulty walking or climbing stairs?: Yes Does the patient have difficulty dressing or bathing?: No Does the patient have difficulty doing errands alone such as visiting a doctor's office or shopping?: Yes  FALL RISK: Fall Risk  05/16/2018 04/11/2018 10/04/2017 07/05/2017 02/21/2017  Falls in the past year? 1 0 No Yes No  Number falls in past yr: 0 - - 1 -  Injury with Fall? 0 - - No -  Risk for fall due to : - - - - -  Follow up Falls evaluation completed Falls evaluation completed - - -    Depression Screen Depression screen [facility] 2/9 05/16/2018 10/04/2017 07/05/2017 02/21/2017 01/03/2017  Decreased Interest 0 0 0 0 0  Down, Depressed, Hopeless 1 0 1 0 0  PHQ - 2 Score 1 0 1 0 0  Altered sleeping 1 - - - -  Tired, decreased energy 1 - - - -  Change in appetite 0 - - - -  Feeling bad or failure about yourself  2 - - - -  Trouble concentrating 1 - - - -  Moving slowly or  fidgety/restless 0 - - - -  Suicidal thoughts 1 - - - -  PHQ-9 Score 7 - - - -  Difficult doing work/chores - - - - -    Advanced Directives Does not have one  Past Medical History:  Past Medical History:  Diagnosis Date  . Arthritis   . Ascending aortic aneurysm (HCC)    a. 09/2017 Stable TAA - 5.1cm.  . Asthma   . Chronic systolic CHF (congestive heart failure) (HCC)    a. EF 25-30% by echo in 07/2016 with cath showing no significant CAD b. 01/2017: EF 30-35% with diffuse HK and moderate MR; c. 07/2017 Echo: EF 30-35%, diff hK. Mild MR, mildly dil LA. PASP .  . Diverticulitis   . Diverticulitis of large intestine with perforation without abscess or bleeding 05/13/2017  . Hypertension   . Hyperthyroidism   . NICM (nonischemic cardiomyopathy) (HCC)   . Noncompliance   . Persistent atrial fibrillation    a. CHA2DS2VASc = 3-->Eliquis (? compliance).    Surgical History:  Past Surgical History:  Procedure Laterality Date  . JOINT REPLACEMENT Right   . RIGHT/LEFT HEART CATH AND CORONARY ANGIOGRAPHY N/A 08/02/2016   Procedure: Right/Left Heart Cath and Coronary Angiography;  Surgeon: Iran Ouch, MD;  Location: [facility] INVASIVE CV LAB;  Service:  Cardiovascular;  Laterality: N/A;  . TESTICLE SURGERY     Patient states that he had to have the tube fixed.    Medications:  Current Outpatient Medications on File Prior to Visit  Medication Sig  . acetaminophen (TYLENOL) 500 MG tablet Take 500 mg by mouth every 6 (six) hours as needed.  Marland Kitchen albuterol (PROVENTIL HFA;VENTOLIN HFA) 108 (90 Base) MCG/ACT inhaler Inhale 2 puffs into the lungs every 6 (six) hours as needed for wheezing or shortness of breath.  Marland Kitchen atorvastatin (LIPITOR) 40 MG tablet Take 1 tablet (40 mg total) by mouth daily.  . budesonide-formoterol (SYMBICORT) 160-4.5 MCG/ACT inhaler Inhale 2 puffs into the lungs 2 (two) times daily.  . digoxin (LANOXIN) 0.125 MG tablet TAKE 1 TABLET (0.125 MG TOTAL) BY MOUTH DAILY  .  ELIQUIS 5 MG TABS tablet TAKE 1 TABLET BY MOUTH TWICE A DAY  . feeding supplement (BOOST / RESOURCE BREEZE) LIQD Take 1 Container by mouth 2 (two) times daily between meals.  . methimazole (TAPAZOLE) 10 MG tablet Take 2 tablets (20 mg total) by mouth 2 (two) times daily.  . metoprolol succinate (TOPROL-XL) 25 MG 24 hr tablet Take 1 tablet (25 mg total) by mouth daily.  . potassium chloride SA (K-DUR,KLOR-CON) 20 MEQ tablet Take 1 tablet (20 mEq total) by mouth daily.  . prednisoLONE acetate (PRED FORTE) 1 % ophthalmic suspension Place 1 drop into the right eye every 2 (two) hours while awake.   . sacubitril-valsartan (ENTRESTO) 24-26 MG Take 1 tablet by mouth 2 (two) times daily.  Marland Kitchen spironolactone (ALDACTONE) 25 MG tablet Take 0.5 tablets (12.5 mg total) by mouth daily.  . Difluprednate (DUREZOL) 0.05 % EMUL Place 1 drop into the right eye every 4 (four) hours while awake.  Marland Kitchen gentamicin (GARAMYCIN) 0.3 % ophthalmic solution Place 1 drop into both eyes every 4 (four) hours. (Patient not taking: Reported on 05/16/2018)  . moxifloxacin (VIGAMOX) 0.5 % ophthalmic solution Place 1 drop into the right eye every 4 (four) hours while awake.   . naphazoline-pheniramine (NAPHCON-A) 0.025-0.3 % ophthalmic solution Place 1 drop into both eyes 4 (four) times daily as needed for eye irritation. (Patient not taking: Reported on 05/16/2018)   No current facility-administered medications on file prior to visit.     Allergies:  No Known Allergies  Social History:  Social History   Socioeconomic History  . Marital status: Single    Spouse name: Not on file  . Number of children: Not on file  . Years of education: Not on file  . Highest education level: Not on file  Occupational History  . Occupation: retired  Engineer, production  . Financial resource strain: Not on file  . Food insecurity:    Worry: Not on file    Inability: Not on file  . Transportation needs:    Medical: Not on file    Non-medical: Not  on file  Tobacco Use  . Smoking status: Current Some Day Smoker    Packs/day: 0.25    Years: 25.00    Pack years: 6.25    Types: Cigarettes  . Smokeless tobacco: Never Used  Substance and Sexual Activity  . Alcohol use: Yes    Comment: Socially - once a month  . Drug use: No  . Sexual activity: Never  Lifestyle  . Physical activity:    Days per week: Not on file    Minutes per session: Not on file  . Stress: Not on file  Relationships  . Social  connections:    Talks on phone: Not on file    Gets together: Not on file    Attends religious service: Not on file    Active member of club or organization: Not on file    Attends meetings of clubs or organizations: Not on file    Relationship status: Not on file  . Intimate partner violence:    Fear of current or ex partner: Not on file    Emotionally abused: Not on file    Physically abused: Not on file    Forced sexual activity: Not on file  Other Topics Concern  . Not on file  Social History Narrative  . Not on file   Social History   Tobacco Use  Smoking Status Current Some Day Smoker  . Packs/day: 0.25  . Years: 25.00  . Pack years: 6.25  . Types: Cigarettes  Smokeless Tobacco Never Used   Social History   Substance and Sexual Activity  Alcohol Use Yes   Comment: Socially - once a month    Family History:  Family History  Problem Relation Age of Onset  . Diabetes Mother   . Diabetes Father   . Heart disease Brother   . Heart attack Brother   . Diabetes Brother   . Kidney failure Brother     Past medical history, surgical history, medications, allergies, family history and social history reviewed with patient today and changes made to appropriate areas of the chart.   Review of Systems  Constitutional: Positive for weight loss. Negative for chills, diaphoresis, fever and malaise/fatigue.  HENT: Positive for hearing loss. Negative for congestion, ear discharge, ear pain, nosebleeds, sinus pain, sore  throat and tinnitus.   Eyes: Positive for blurred vision and discharge. Negative for double vision, photophobia, pain and redness.  Respiratory: Positive for shortness of breath and wheezing. Negative for cough, hemoptysis, sputum production and stridor.   Cardiovascular: Positive for palpitations. Negative for chest pain, orthopnea, claudication, leg swelling and PND.  Gastrointestinal: Positive for diarrhea. Negative for abdominal pain, blood in stool, constipation, heartburn, melena, nausea and vomiting.  Genitourinary: Negative.   Musculoskeletal: Positive for joint pain and myalgias. Negative for back pain, falls and neck pain.  Skin: Negative.   Neurological: Positive for tingling. Negative for dizziness, tremors, sensory change, speech change, focal weakness, seizures, loss of consciousness, weakness and headaches.  Endo/Heme/Allergies: Negative.   Psychiatric/Behavioral: Negative.     All other ROS negative except what is listed above and in the HPI.      Objective:    Pulse 72   Temp 98.4 F (36.9 C) (Oral)   Ht 6' (1.829 m)   Wt 259 lb (117.5 kg)   SpO2 98%   BMI 35.13 kg/m   Wt Readings from Last 3 Encounters:  05/16/18 259 lb (117.5 kg)  05/08/18 268 lb 3.2 oz (121.7 kg)  04/11/18 258 lb (117 kg)    Physical Exam  Constitutional: He is oriented to person, place, and time. He appears well-developed and well-nourished. No distress.  HENT:  Head: Normocephalic and atraumatic.  Right Ear: Hearing, tympanic membrane, external ear and ear canal normal.  Left Ear: Hearing, tympanic membrane, external ear and ear canal normal.  Nose: Nose normal.  Mouth/Throat: Uvula is midline, oropharynx is clear and moist and mucous membranes are normal. No oropharyngeal exudate.  Eyes: Pupils are equal, round, and reactive to light. EOM and lids are normal. Right eye exhibits no discharge. Left eye exhibits no discharge. No  scleral icterus.  Scarring on  R eye  Neck: Normal range of  motion. Neck supple. No JVD present. No tracheal deviation present. No thyromegaly present.  Cardiovascular: Normal rate, regular rhythm, normal heart sounds and intact distal pulses. Exam reveals no gallop and no friction rub.  No murmur heard. Pulmonary/Chest: Effort normal and breath sounds normal. No stridor. No respiratory distress. He has no wheezes. He has no rales. He exhibits no tenderness.  Abdominal: Soft. Bowel sounds are normal. He exhibits no distension and no mass. There is no tenderness. There is no rebound and no guarding. No hernia.  Genitourinary:  Genitourinary Comments: Genital exam deferred today with shared decision making  Musculoskeletal: Normal range of motion. He exhibits no edema, tenderness or deformity.  Lymphadenopathy:    He has no cervical adenopathy.  Neurological: He is alert and oriented to person, place, and time. He displays normal reflexes. No cranial nerve deficit or sensory deficit. He exhibits normal muscle tone. Coordination normal.  Skin: Skin is warm, dry and intact. Capillary refill takes less than 2 seconds. No rash noted. He is not diaphoretic. No erythema. No pallor.  Psychiatric: He has a normal mood and affect. His speech is normal and behavior is normal. Judgment and thought content normal. Cognition and memory are normal.  Nursing note and vitals reviewed.   6CIT Screen 05/16/2018  What Year? 0 points  What month? 0 points  What time? 0 points  Count back from 20 0 points  Months in reverse 2 points  Repeat phrase 2 points  Total Score 4    Results for orders placed or performed during the hospital encounter of 05/08/18  HCV FibroSURE  Result Value Ref Range   Fibrosis Score 0.35 (H) 0.00 - 0.21   Fibrosis Stage SEE COMMENT    Necroinflammat Activity Score 0.20 (H) 0.00 - 0.17   Necroinflammat Activity Grade SEE COMMENT    ALPHA 2-MACROGLOBULINS, QN 207 110 - 276 mg/dL   Haptoglobin 161 34 - 200 mg/dL   Apolipoprotein A-1 096 101  - 178 mg/dL   Bilirubin, Total 0.9 0.0 - 1.2 mg/dL   GGT 36 0 - 65 IU/L   ALT (SGPT) P5P 36 0 - 55 IU/L   Interpretations: Comment    Fibrosis Scoring: Comment    Necroinflamm Activity Scoring: Comment    Limitations: Comment    Comment Comment       Assessment & Plan:   Problem List Items Addressed This Visit      Cardiovascular and Mediastinum   AAA (abdominal aortic aneurysm) (HCC)    Due for repeat. No-showed his appointment. Rescheduled today.        Digestive   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)    To start treatment shortly. Continue to follow with GI.         Endocrine   Hyperthyroidism    Due to see endocrinology next week. TSH stable last visit. Continue methimazole. Call with any concerns.         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 Appointment scheduled.       Advance directive discussed with patient    A voluntary discussion about advance care planning including the explanation and discussion of advance directives was extensively discussed  with the patient for 5 minutes with patient.  Explanation about the health care proxy and Living will was reviewed and packet with forms with explanation  of how to fill them out was given.  During this discussion, the patient was not able to identify a health care proxy and plans fill out the paperwork required.  Patient was offered a separate Advance Care Planning visit for further assistance with forms.          Other Visit Diagnoses    Medicare annual wellness visit, subsequent    -  Primary   Preventative care discussed today as below.   Routine general medical examination at a health care facility       Vaccines up to date. Screening labs checked today. Refuses colonoscopy and cologuard. Continue diet and exercise. Call with any concerns.    Need for hepatitis B vaccination       #2 given today. due for #3 in 6 month   Relevant Orders   Hepatitis B vaccine  adult IM (Completed)       Preventative Services:  AAA Screening: Due Health Risk Assessment and Personalized Prevention Plan: Done today Bone Mass Measurements: N/A CVD Screening: Done last visit Colon Cancer Screening: Unsure when last one was done- possibly 3-4 years ago Depression Screening: Done today Diabetes Screening: UTD Glaucoma Screening: See your Eye Doctor Hepatitis B vaccine: Done today Hepatitis C screening: UTD HIV Screening: UTD Flu Vaccine: UTD Lung cancer Screening: Up to date Obesity Screening: Done today Pneumonia Vaccines (2): UTD STI Screening: N/A PSA screening: UTD  LABORATORY TESTING:  Health maintenance labs ordered today as discussed above.   IMMUNIZATIONS:   - Tdap: Tetanus vaccination status reviewed: last tetanus booster within 10 years. - Influenza: Up to date - Pneumovax: Up to date - Prevnar: Up to date - Zostavax vaccine: Not applicable  SCREENING: - Colonoscopy: Refused  Discussed with patient purpose of the colonoscopy is to detect colon cancer at curable precancerous or early stages   - AAA Screening: Ordered today   PATIENT COUNSELING:    Sexuality: Discussed sexually transmitted diseases, partner selection, use of condoms, avoidance of unintended pregnancy  and contraceptive alternatives.   Advised to avoid cigarette smoking.  I discussed with the patient that most people either abstain from alcohol or drink within safe limits (<=14/week and <=4 drinks/occasion for males, <=7/weeks and <= 3 drinks/occasion for females) and that the risk for alcohol disorders and other health effects rises proportionally with the number of drinks per week and how often a drinker exceeds daily limits.  Discussed cessation/primary prevention of drug use and availability of treatment for abuse.   Diet: Encouraged to adjust caloric intake to maintain  or achieve ideal body weight, to reduce intake of dietary saturated fat and total fat, to limit  sodium intake by avoiding high sodium foods and not adding table salt, and to maintain adequate dietary potassium and calcium preferably from fresh fruits, vegetables, and low-fat dairy products.    stressed the importance of regular exercise  Injury prevention: Discussed safety belts, safety helmets, smoke detector, smoking near bedding or upholstery.   Dental health: Discussed importance of regular tooth brushing, flossing, and dental visits.   Follow up plan: NEXT PREVENTATIVE PHYSICAL DUE IN 1 YEAR. Return in about 2 months (around 07/17/2018) for DM follow up.

## 2018-05-16 NOTE — Assessment & Plan Note (Signed)
To start treatment shortly. Continue to follow with GI.

## 2018-05-17 ENCOUNTER — Other Ambulatory Visit: Payer: Self-pay

## 2018-05-17 MED ORDER — SOFOSBUVIR-VELPATASVIR 400-100 MG PO TABS: 1.0000 | ORAL_TABLET | Freq: Every day | ORAL | 2 refills | Status: AC

## 2018-05-18 ENCOUNTER — Telehealth: Payer: Self-pay

## 2018-05-18 NOTE — Telephone Encounter (Signed)
Received fax refill request for Furosemide 40 mg. Do not see on current med list. Please advise.

## 2018-05-18 NOTE — Telephone Encounter (Signed)
I don't see that he has been on that since February of last year. He needs to follow up with his cardiologist.

## 2018-05-18 NOTE — Telephone Encounter (Signed)
Patient daughter Ursula Alert notified. DPR reviewed. Pt daughter states that pt is currently taking furosemide. Pt daughter verbalized understanding about following up with cardiologist.

## 2018-05-19 ENCOUNTER — Other Ambulatory Visit: Payer: Self-pay | Admitting: Family Medicine

## 2018-05-19 NOTE — Telephone Encounter (Signed)
Requested medication (s) are due for refill today unknown  Requested medication (s) are on the active medication list -not on list  Future visit scheduled -no  Last refill: not active on list  Notes to clinic: Patient is requesting refill of lasix- failed lab/BP protocol. Also see # and RF  Requested Prescriptions  Pending Prescriptions Disp Refills   furosemide (LASIX) 40 MG tablet [Pharmacy Med Name: FUROSEMIDE 40 MG TABLET] 90 tablet 21    Sig: TAKE 1 TABLET (40 MG TOTAL) BY MOUTH DAILY AS NEEDED. FOR SOB/LE EDEMA     Cardiovascular:  Diuretics - Loop Failed - 05/19/2018 11:07 AM      Failed - Cr in normal range and within 360 days    Creatinine, Ser  Date Value Ref Range Status  04/11/2018 0.75 (L) 0.76 - 1.27 mg/dL Final         Failed - Last BP in normal range    BP Readings from Last 1 Encounters:  05/08/18 (!) 156/100         Passed - K in normal range and within 360 days    Potassium  Date Value Ref Range Status  04/11/2018 4.0 3.5 - 5.2 mmol/L Final         Passed - Ca in normal range and within 360 days    Calcium  Date Value Ref Range Status  04/11/2018 9.1 8.6 - 10.2 mg/dL Final         Passed - Na in normal range and within 360 days    Sodium  Date Value Ref Range Status  04/11/2018 139 134 - 144 mmol/L Final         Passed - Valid encounter within last 6 months    Recent Outpatient Visits          3 days ago Medicare annual wellness visit, subsequent   [employer], [place], DO   1 month ago Chronic systolic heart failure [facility])   [facility] [place], Megan P, DO   1 year ago Pain of right eye   [facility] [place], Megan P, DO   1 year ago Chronic bilateral low back pain, with sciatica presence unspecified   [employer], Megan P, DO   1 year ago 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)   [facility] [place], Megan P, DO              Requested  Prescriptions  Pending Prescriptions Disp Refills   furosemide (LASIX) 40 MG tablet [Pharmacy Med Name: FUROSEMIDE 40 MG TABLET] 90 tablet 21    Sig: TAKE 1 TABLET (40 MG TOTAL) BY MOUTH DAILY AS NEEDED. FOR SOB/LE EDEMA     Cardiovascular:  Diuretics - Loop Failed - 05/19/2018 11:07 AM      Failed - Cr in normal range and within 360 days    Creatinine, Ser  Date Value Ref Range Status  04/11/2018 0.75 (L) 0.76 - 1.27 mg/dL Final         Failed - Last BP in normal range    BP Readings from Last 1 Encounters:  05/08/18 (!) 156/100         Passed - K in normal range and within 360 days    Potassium  Date Value Ref Range Status  04/11/2018 4.0 3.5 - 5.2 mmol/L Final         Passed - Ca in normal range and within 360 days    Calcium  Date Value Ref Range Status  04/11/2018 9.1 8.6 - 10.2 mg/dL Final         Passed - Na in normal range and within 360 days    Sodium  Date Value Ref Range Status  04/11/2018 139 134 - 144 mmol/L Final         Passed - Valid encounter within last 6 months    Recent Outpatient Visits          3 days ago Medicare annual wellness visit, subsequent   [employer], Megan P, DO   1 month ago Chronic systolic heart failure [facility])   [facility] [place], Megan P, DO   1 year ago Pain of right eye   [facility] [place], Megan P, DO   1 year ago Chronic bilateral low back pain, with sciatica presence unspecified   [facility] [place], Megan P, DO   1 year ago 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)   [facility] [place], [place], DO

## 2018-05-22 ENCOUNTER — Other Ambulatory Visit: Payer: Self-pay | Admitting: Family Medicine

## 2018-05-23 ENCOUNTER — Ambulatory Visit: Admission: RE | Admit: 2018-05-23 | Payer: Medicare HMO | Source: Ambulatory Visit

## 2018-05-24 ENCOUNTER — Ambulatory Visit: Payer: Medicare HMO | Admitting: Endocrinology

## 2018-06-19 ENCOUNTER — Other Ambulatory Visit: Payer: Self-pay | Admitting: Family Medicine

## 2018-06-19 NOTE — Telephone Encounter (Addendum)
Patient called and spoke to his daughter Toniann Fail about the Furosemide prescription. I advised it was removed from his medication list at the last OV with Dr. [place] noting he hasn't taken in 30 days. She says he takes it and just ran out, he was stretching it because he was running low. I advised I will send to Dr. [place] for refill.

## 2018-06-20 NOTE — Telephone Encounter (Signed)
Wrong documentation on 06/19/18 at 1752.  Corrected documentation: Patient called and spoke to his daughter Toniann Fail about the Furosemide prescription. I advised it was removed from his medication list at the OV on 04/11/18 with Dr. Laural Benes noting he hasn't taken in 30 days. She says he take it and just ran out, he was stretching it because he was running low. I advised I will send to Dr. Laural Benes for refill, she verbalized understanding.

## 2018-06-20 NOTE — Telephone Encounter (Signed)
This patient has never been seen by [employer] or Dr Swaziland. Will send to Olevia Perches, DO to advise

## 2018-06-20 NOTE — Telephone Encounter (Signed)
30 days given- he needs to follow up with cardiology

## 2018-06-20 NOTE — Telephone Encounter (Signed)
Called pt no answer, will call back later.

## 2018-06-21 NOTE — Telephone Encounter (Signed)
Spoke with pts daughter and she will call and get this scheduled.

## 2018-07-01 IMAGING — CT CT ANGIO CHEST
2 of 6 series · 19 of 46 positions shown · IV contrast (APPLIED)
Comparison: None.

CLINICAL DATA: Chest pain for 2-3 days

EXAM:
CT ANGIOGRAPHY CHEST WITH CONTRAST
TECHNIQUE: Multidetector CT imaging of the chest was performed using the
standard protocol during bolus administration of intravenous
contrast. Multiplanar CT image reconstructions and MIPs were
obtained to evaluate the vascular anatomy.
CONTRAST:  75 mL Isovue 370

[Series 5: thins · axial · 0.88mm/px · z∈[-365,-89]mm · 17 of 304 slices shown]
[im 14/304  lung]
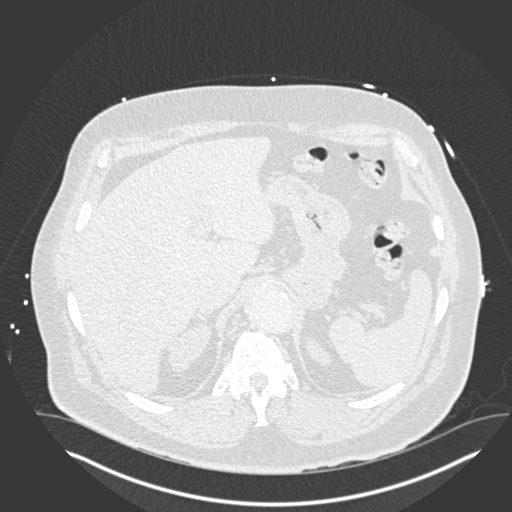
[im 27/304  soft-tissue]
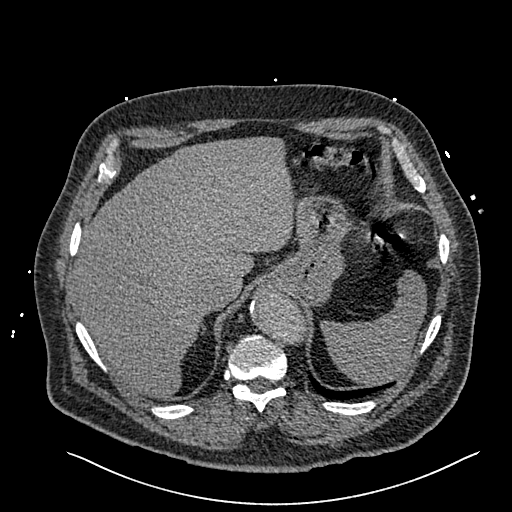
[im 53/304  lung]
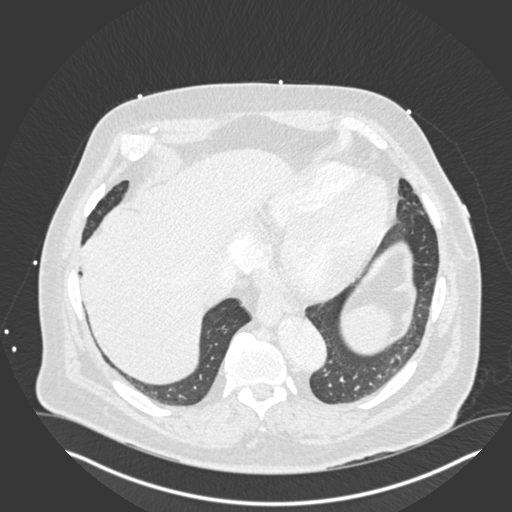
[im 66/304  soft-tissue]
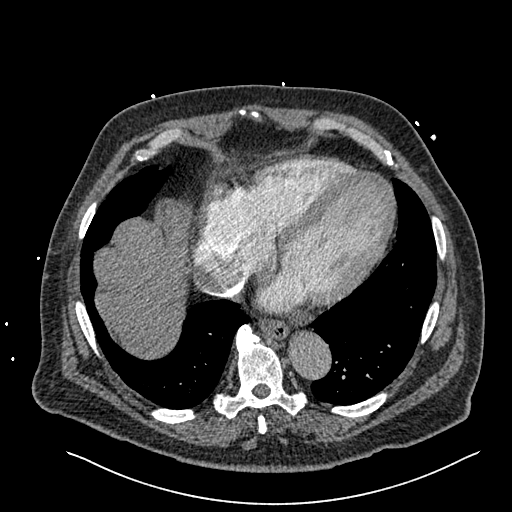
[im 80/304  lung]
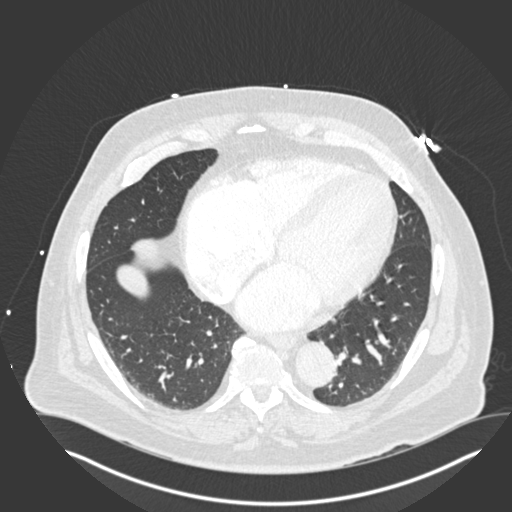
[im 106/304  soft-tissue]
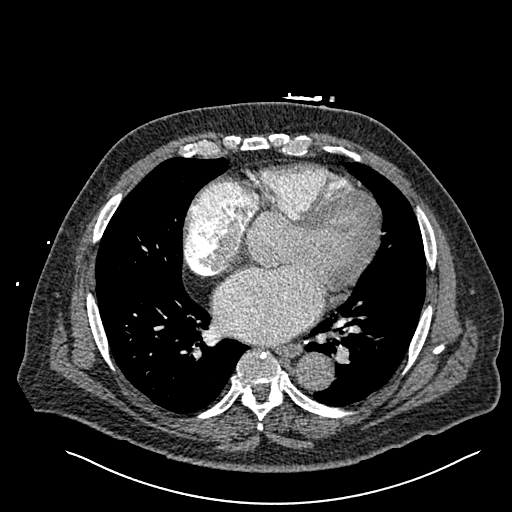
[im 119/304  lung]
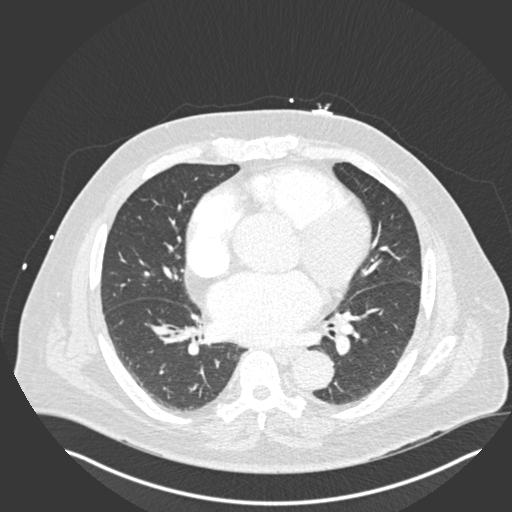
[im 132/304  soft-tissue]
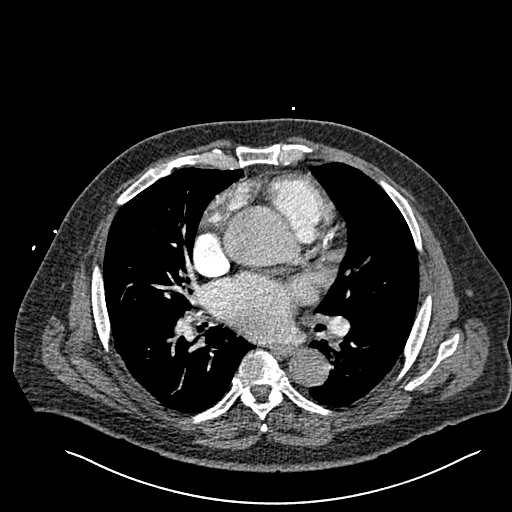
[im 159/304  lung]
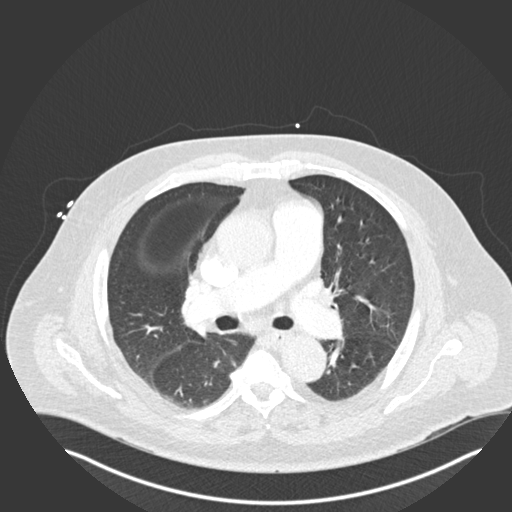
[im 172/304  soft-tissue]
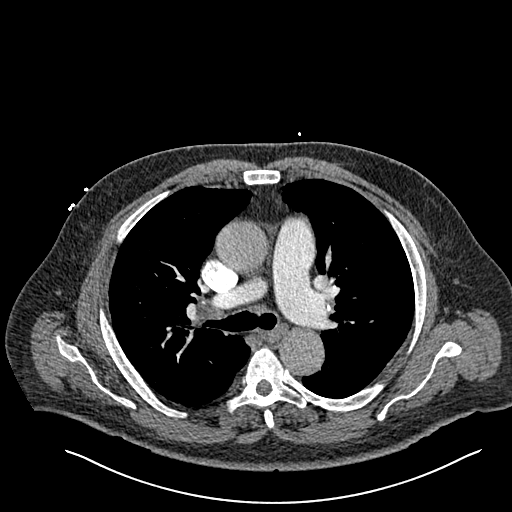
[im 185/304  lung]
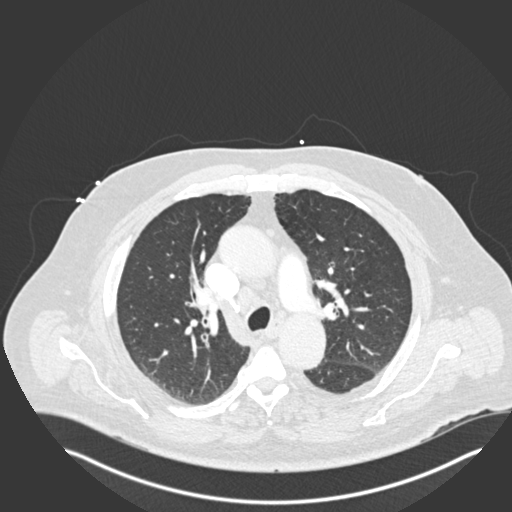
[im 198/304  soft-tissue]
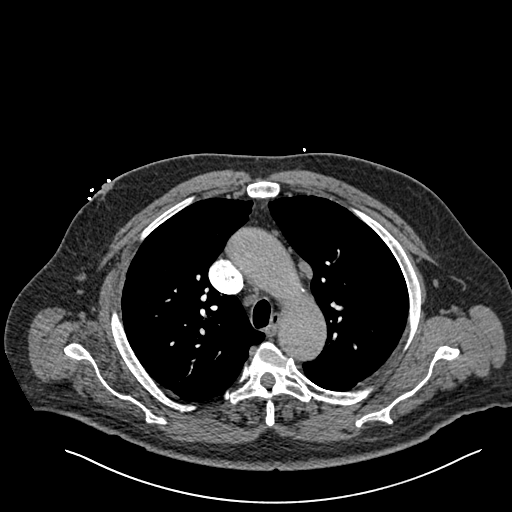
[im 224/304  lung]
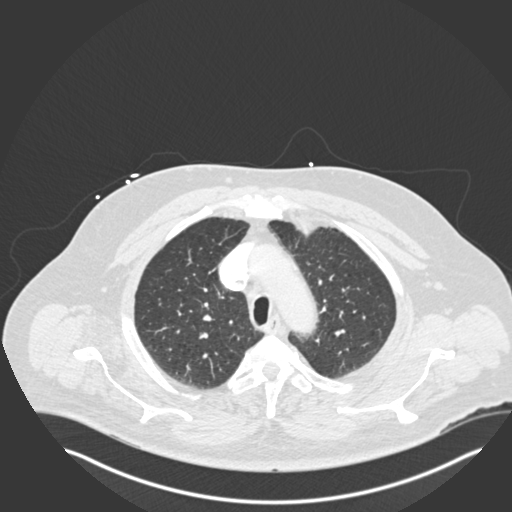
[im 238/304  soft-tissue]
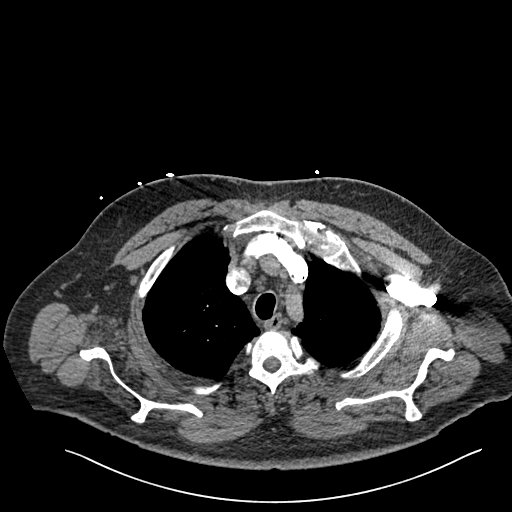
[im 251/304  lung]
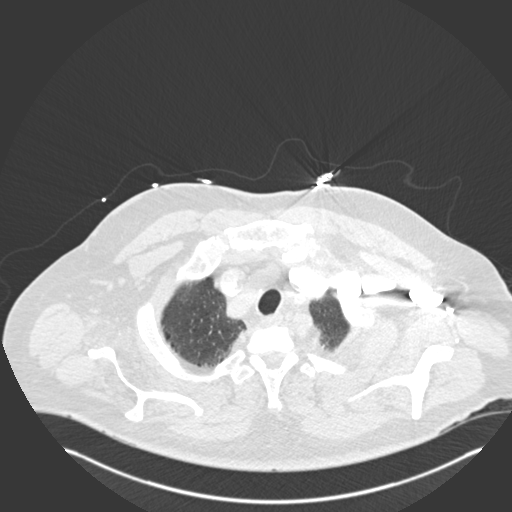
[im 277/304  soft-tissue]
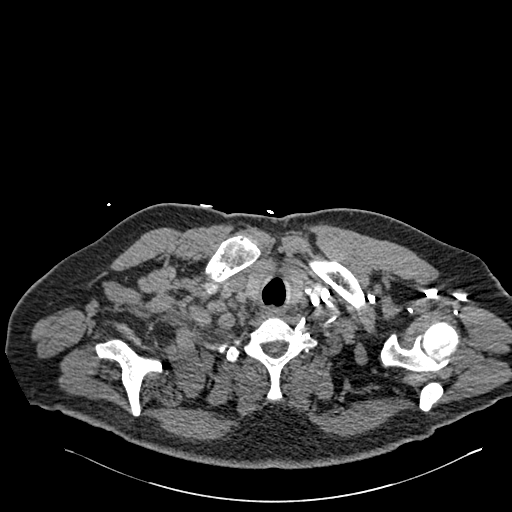
[im 290/304  lung]
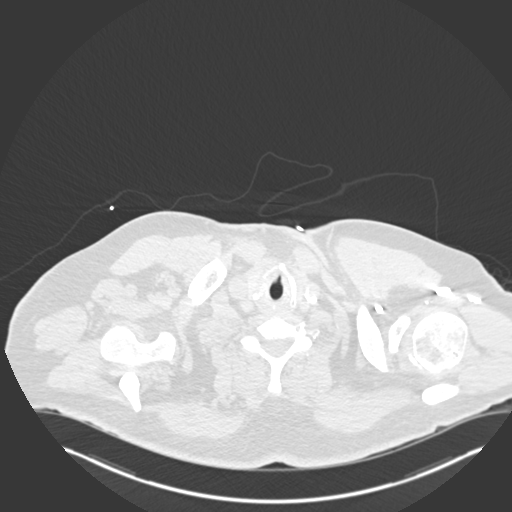

[Series 7: coronal mpr · coronal · 0.63mm/px · 2 of 104 slices shown]
[im 35/104  soft-tissue]
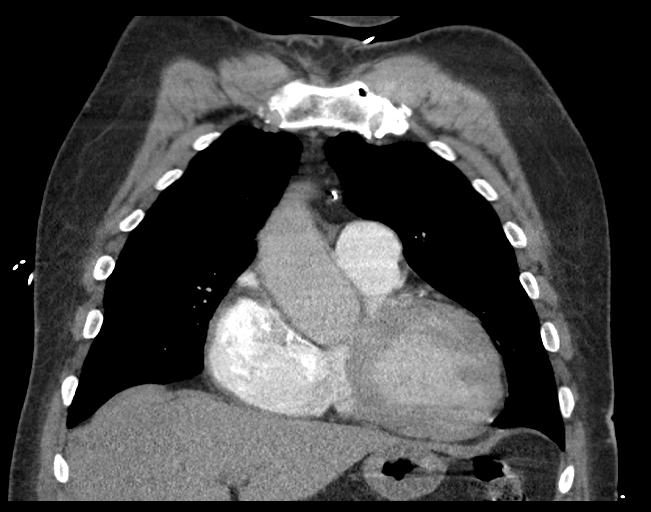
[im 69/104  soft-tissue]
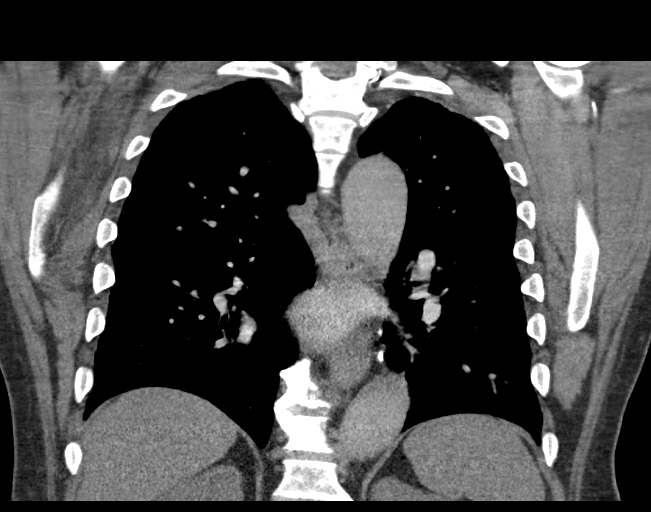

[19 of 46 positions shown; findings below may reference images not displayed]

FINDINGS: Cardiovascular: Satisfactory opacification of the pulmonary arteries
to the segmental level. No evidence of pulmonary embolism. Enlarged
heart size. No pericardial effusion. Coronary artery atherosclerosis
in the LAD.

Mediastinum/Nodes: No axillary or hilar lymphadenopathy. Mild
prominence of the mediastinal lymph nodes with the largest right
lower paratracheal lymph node measuring 12 mm in short axis. Thyroid
gland, trachea, and esophagus demonstrate no significant findings.

Lungs/Pleura: Lungs are clear. No pleural effusion or pneumothorax.

Upper Abdomen: No acute abnormality.

Musculoskeletal: No chest wall abnormality. No acute or significant
osseous findings.

Review of the MIP images confirms the above findings.
IMPRESSION: 1. No evidence of pulmonary embolus.
2. No thoracic aortic aneurysm or dissection.
3. Cardiomegaly.

## 2018-07-01 IMAGING — CR DG CHEST 2V
2 series · 2 of 2 positions shown · non-contrast
Comparison: None.

CLINICAL DATA: Pt brought over from [REDACTED] with c/o chest pain and
arthritis pain intermittent x 3-4 days. Pt states has been out of
his heart and arthritis medicines for a "while". Hx HTN. Current
some day smoker

EXAM:
CHEST  2 VIEW

[chest pa]
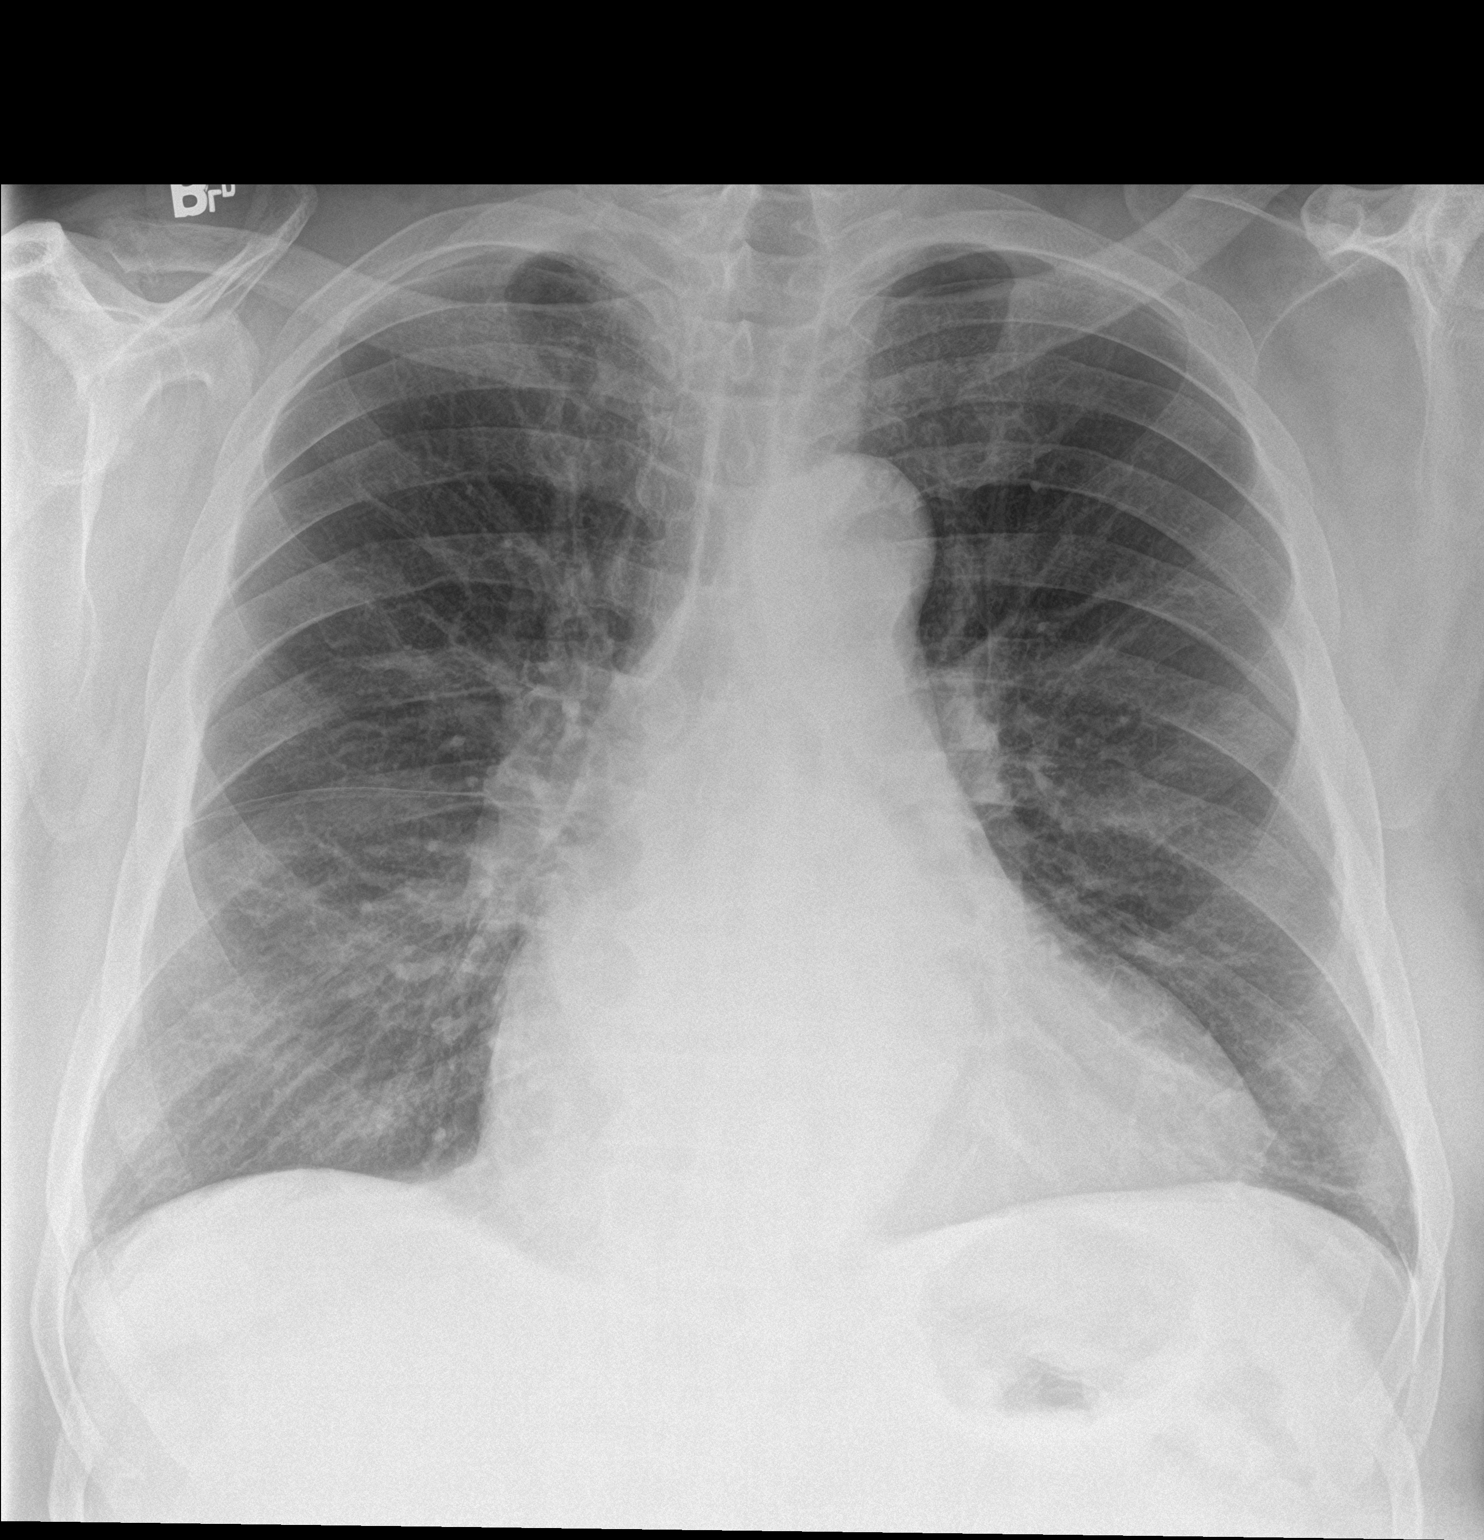

[chest lat]
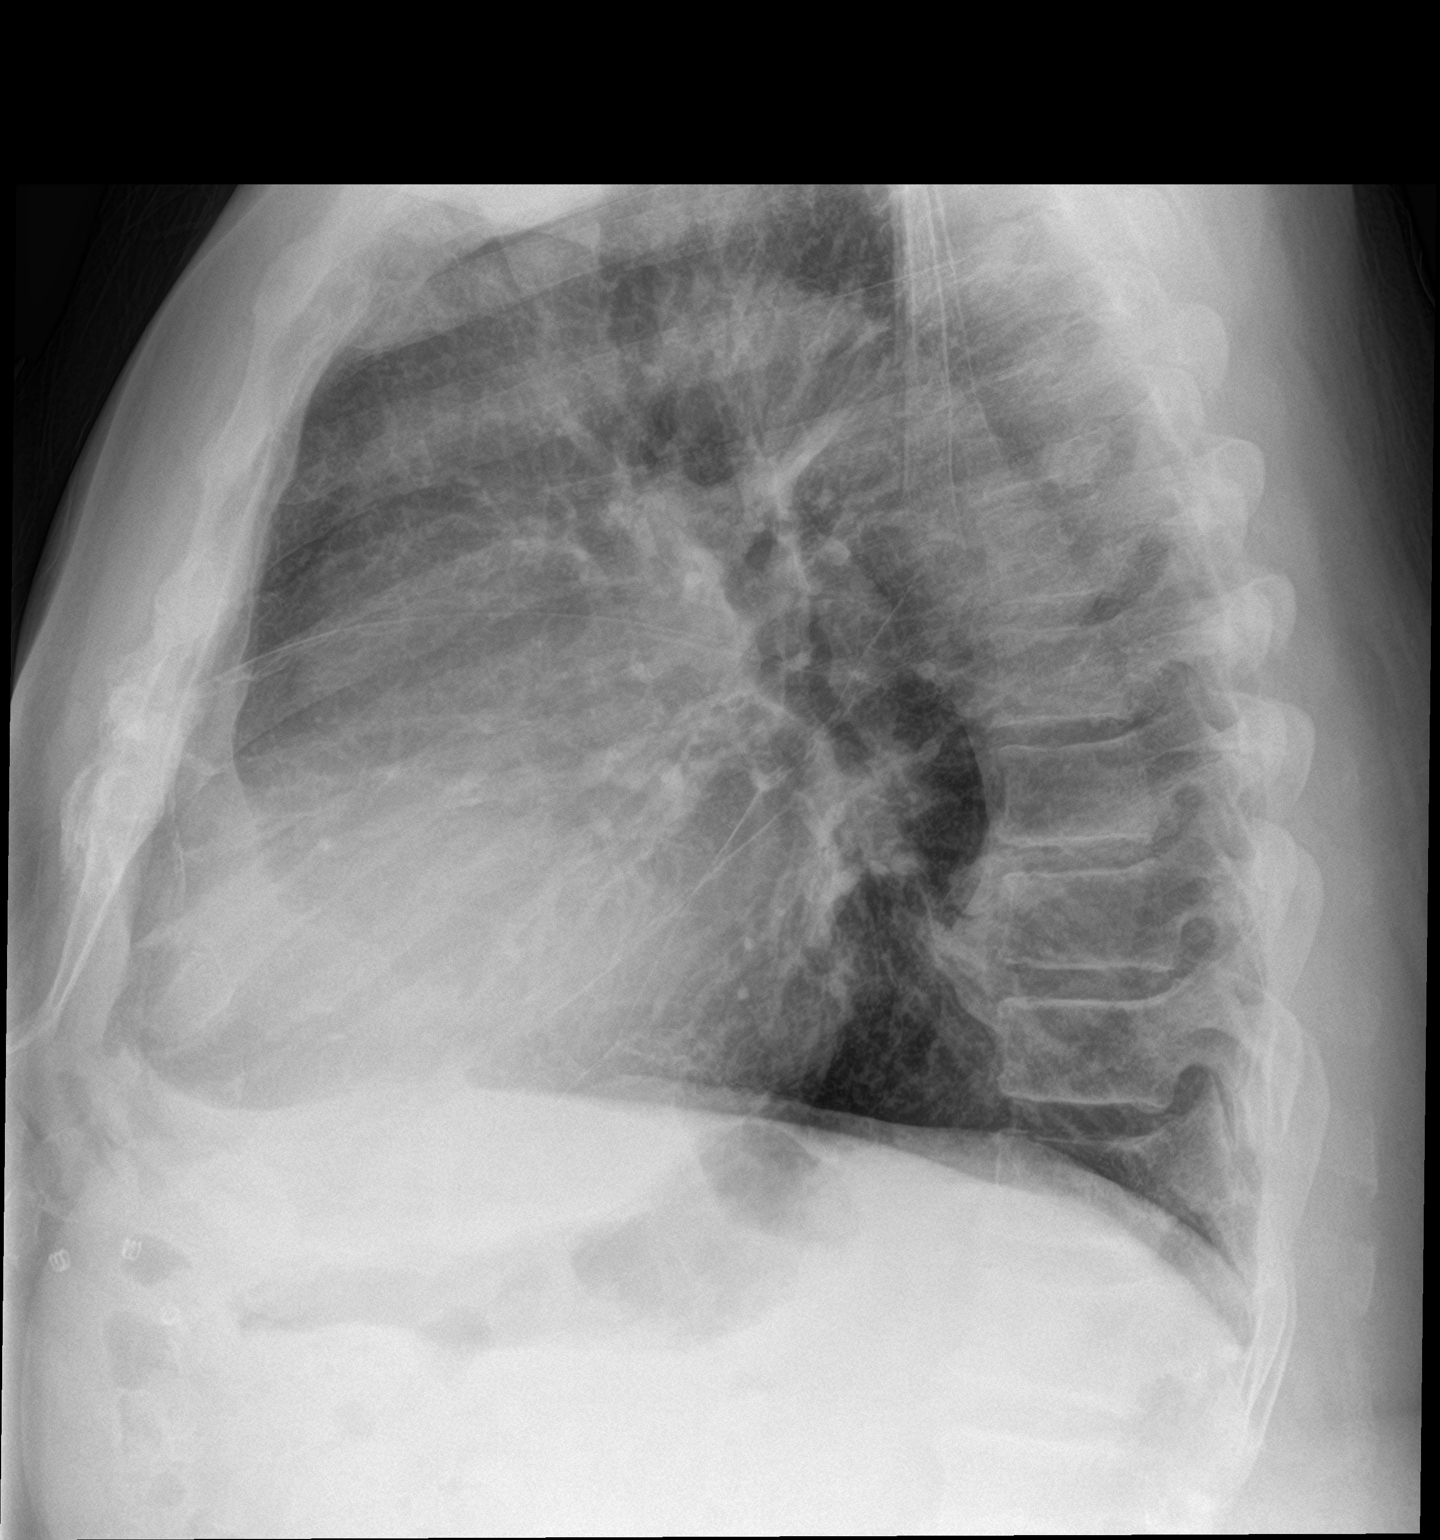

[2 of 2 positions shown; findings below may reference images not displayed]

FINDINGS: Normal cardiac silhouette ectatic aorta. No effusion, infiltrate
pneumothorax. Fine linear opacities at the lung bases . No pleural
fluid. Lungs mildly hyperinflated. Degenerative osteophytosis of the
spine.

On lateral projection there is a 10 mm density projecting over the
posterior column of the T6 vertebral body.
IMPRESSION: 1. Mild interstitial edema or bronchiolitis pattern.
2. Pulmonary nodule versus osteophyte projecting over the spine on
the lateral projection. Recommend CT thorax for evaluation.

## 2018-07-04 ENCOUNTER — Other Ambulatory Visit: Payer: Self-pay | Admitting: Family Medicine

## 2018-07-17 ENCOUNTER — Ambulatory Visit: Payer: Medicare HMO | Admitting: Family

## 2018-08-01 ENCOUNTER — Other Ambulatory Visit: Payer: Self-pay

## 2018-08-01 NOTE — Telephone Encounter (Signed)
Needs follow up appointment and to keep his appointment with the heart failure clinic which he no-showed

## 2018-08-01 NOTE — Telephone Encounter (Signed)
Patient last seen 05/16/18.

## 2018-08-02 ENCOUNTER — Encounter: Payer: Self-pay | Admitting: Family Medicine

## 2018-08-02 NOTE — Telephone Encounter (Signed)
Letter printed to mail. °

## 2018-08-03 MED ORDER — FUROSEMIDE 40 MG PO TABS: ORAL_TABLET | ORAL | 0 refills | Status: DC

## 2018-08-03 MED ORDER — SPIRONOLACTONE 25 MG PO TABS: 12.5000 mg | ORAL_TABLET | Freq: Every day | ORAL | 0 refills | Status: DC

## 2018-08-19 ENCOUNTER — Other Ambulatory Visit: Payer: Self-pay | Admitting: Family

## 2018-08-26 ENCOUNTER — Other Ambulatory Visit: Payer: Self-pay | Admitting: Family Medicine

## 2018-08-27 NOTE — Telephone Encounter (Signed)
Requested medication (s) are due for refill today: yes  Requested medication (s) are on the active medication list: yes  Last refill:  Last refilled on 08/01/18 #30  Future visit scheduled: yes, 08/28/18  Notes to clinic:  Pt scheduled for follow up appt on 08/28/18. 30 day refill given previously until f/u appt.     Requested Prescriptions  Pending Prescriptions Disp Refills   furosemide (LASIX) 40 MG tablet [Pharmacy Med Name: FUROSEMIDE 40 MG TABLET] 30 tablet 0    Sig: TAKE 1 TABLET (40 MG TOTAL) BY MOUTH DAILY AS NEEDED. FOR SHORTNESS OF BREATH/LE EDEMA     Cardiovascular:  Diuretics - Loop Failed - 08/26/2018  1:36 PM      Failed - Cr in normal range and within 360 days    Creatinine, Ser  Date Value Ref Range Status  04/11/2018 0.75 (L) 0.76 - 1.27 mg/dL Final         Failed - Last BP in normal range    BP Readings from Last 1 Encounters:  05/08/18 (!) 156/100         Passed - K in normal range and within 360 days    Potassium  Date Value Ref Range Status  04/11/2018 4.0 3.5 - 5.2 mmol/L Final         Passed - Ca in normal range and within 360 days    Calcium  Date Value Ref Range Status  04/11/2018 9.1 8.6 - 10.2 mg/dL Final         Passed - Na in normal range and within 360 days    Sodium  Date Value Ref Range Status  04/11/2018 139 134 - 144 mmol/L Final         Passed - Valid encounter within last 6 months    Recent Outpatient Visits          3 months ago Medicare annual wellness visit, subsequent   [facility], [place], DO   4 months ago Chronic systolic heart failure [facility])   [facility] [place], Megan P, DO   1 year ago Pain of right eye   [facility] [place], Megan P, DO   1 year ago Chronic bilateral low back pain, with sciatica presence unspecified   [facility] [place], Megan P, DO   1 year ago Chronic hepatitis C without hepatic coma [facility])   [facility] [place], Oralia Rud,  DO      Future Appointments            Tomorrow Dorcas Carrow, DO [facility]

## 2018-08-28 ENCOUNTER — Other Ambulatory Visit: Payer: Self-pay

## 2018-08-28 ENCOUNTER — Encounter: Payer: Self-pay | Admitting: Family Medicine

## 2018-08-28 ENCOUNTER — Ambulatory Visit (INDEPENDENT_AMBULATORY_CARE_PROVIDER_SITE_OTHER): Payer: Medicare HMO | Admitting: Family Medicine

## 2018-08-28 VITALS — BP 144/91 | HR 90 | Temp 97.7°F

## 2018-08-28 DIAGNOSIS — E119 Type 2 diabetes mellitus without complications: Secondary | ICD-10-CM | POA: Diagnosis not present

## 2018-08-28 DIAGNOSIS — I5022 Chronic systolic (congestive) heart failure: Secondary | ICD-10-CM

## 2018-08-28 DIAGNOSIS — E0590 Thyrotoxicosis, unspecified without thyrotoxic crisis or storm: Secondary | ICD-10-CM | POA: Diagnosis not present

## 2018-08-28 DIAGNOSIS — J42 Unspecified chronic bronchitis: Secondary | ICD-10-CM | POA: Diagnosis not present

## 2018-08-28 DIAGNOSIS — B182 Chronic viral hepatitis C: Secondary | ICD-10-CM

## 2018-08-28 DIAGNOSIS — I10 Essential (primary) hypertension: Secondary | ICD-10-CM | POA: Diagnosis not present

## 2018-08-28 LAB — BAYER DCA HB A1C WAIVED: HB A1C (BAYER DCA - WAIVED): 6.9 % (ref <7.0)

## 2018-08-28 MED ORDER — FUROSEMIDE 40 MG PO TABS: ORAL_TABLET | ORAL | 1 refills | Status: DC

## 2018-08-28 MED ORDER — ATORVASTATIN CALCIUM 40 MG PO TABS: 40.0000 mg | ORAL_TABLET | Freq: Every day | ORAL | 1 refills | Status: DC

## 2018-08-28 MED ORDER — METOPROLOL SUCCINATE ER 25 MG PO TB24: 25.0000 mg | ORAL_TABLET | Freq: Every day | ORAL | 1 refills | Status: DC

## 2018-08-28 MED ORDER — DIGOXIN 125 MCG PO TABS: 0.1250 mg | ORAL_TABLET | Freq: Every day | ORAL | 1 refills | Status: DC

## 2018-08-28 MED ORDER — SPIRONOLACTONE 25 MG PO TABS: 12.5000 mg | ORAL_TABLET | Freq: Every day | ORAL | 1 refills | Status: DC

## 2018-08-28 NOTE — Progress Notes (Signed)
BP (!) 144/91   Pulse 90   Temp 97.7 F (36.5 C) (Oral)   SpO2 99%    Subjective:    Patient ID: Eugene Henderson, male    DOB: Jul 05, 1950, 68 y.o.   MRN: [id number]  HPI: Eugene Henderson is a 68 y.o. male  Chief Complaint  Patient presents with  . Diabetes   Hasn't had a drink since 06/07/18. Has been undergoing treatment for his hep C- he is almost done with it. Feeling well. Avoiding any urges. No concerns  DIABETES Hypoglycemic episodes:no Polydipsia/polyuria: no Visual disturbance: no Chest pain: no Paresthesias: no Glucose Monitoring: no  Accucheck frequency: Not Checking Taking Insulin?: no Blood Pressure Monitoring: not checking Retinal Examination: Not up to Date Foot Exam: Up to Date Diabetic Education: Completed Pneumovax: Up to Date Influenza: Not up to Date Aspirin: no  HYPERTENSION / HYPERLIPIDEMIA Satisfied with current treatment? yes Duration of hypertension: chronic BP monitoring frequency: not checking BP medication side effects: no Past BP meds: lasix, metoprolol, spironalactone, entresto Duration of hyperlipidemia: chronic Cholesterol medication side effects: no Cholesterol supplements: none Past cholesterol medications: atorvastatin Medication compliance: good compliance Aspirin: no Recent stressors: no Recurrent headaches: no Visual changes: no Palpitations: no Dyspnea: no Chest pain: no Lower extremity edema: no Dizzy/lightheaded: no  HYPERTHYROIDISM Thyroid control status:unknown- has not seen endocrinology yet Satisfied with current treatment? yes Medication side effects: no Medication compliance: good compliance Recent dose adjustment:no Fatigue: no Cold intolerance: no Heat intolerance: no Weight gain: yes Weight loss: no Constipation: no Diarrhea/loose stools: no Palpitations: no Lower extremity edema: no Anxiety/depressed mood: no     Relevant past medical, surgical, family and social history reviewed and updated as  indicated. Interim medical history since our last visit reviewed. Allergies and medications reviewed and updated.  Review of Systems  Constitutional: Negative.   Respiratory: Negative.   Cardiovascular: Negative.   Gastrointestinal: Negative.   Genitourinary: Negative.   Musculoskeletal: Negative.   Neurological: Negative.   Psychiatric/Behavioral: Negative.     Per HPI unless specifically indicated above     Objective:    BP (!) 144/91   Pulse 90   Temp 97.7 F (36.5 C) (Oral)   SpO2 99%   Wt Readings from Last 3 Encounters:  05/16/18 259 lb (117.5 kg)  05/08/18 268 lb 3.2 oz (121.7 kg)  04/11/18 258 lb (117 kg)    Physical Exam Vitals signs and nursing note reviewed.  Constitutional:      General: He is not in acute distress.    Appearance: Normal appearance. He is not ill-appearing, toxic-appearing or diaphoretic.  HENT:     Head: Normocephalic and atraumatic.     Right Ear: External ear normal.     Left Ear: External ear normal.     Nose: Nose normal.     Mouth/Throat:     Mouth: Mucous membranes are moist.     Pharynx: Oropharynx is clear.  Eyes:     General: No scleral icterus.       Right eye: No discharge.        Left eye: No discharge.     Extraocular Movements: Extraocular movements intact.     Conjunctiva/sclera: Conjunctivae normal.     Pupils: Pupils are equal, round, and reactive to light.  Neck:     Musculoskeletal: Normal range of motion and neck supple.  Cardiovascular:     Rate and Rhythm: Normal rate and regular rhythm.     Pulses: Normal pulses.  Heart sounds: Normal heart sounds. No murmur. No friction rub. No gallop.   Pulmonary:     Effort: Pulmonary effort is normal. No respiratory distress.     Breath sounds: Normal breath sounds. No stridor. No wheezing, rhonchi or rales.  Chest:     Chest wall: No tenderness.  Musculoskeletal: Normal range of motion.  Skin:    General: Skin is warm and dry.     Capillary Refill: Capillary  refill takes less than 2 seconds.     Coloration: Skin is not jaundiced or pale.     Findings: No bruising, erythema, lesion or rash.  Neurological:     General: No focal deficit present.     Mental Status: He is alert and oriented to person, place, and time. Mental status is at baseline.  Psychiatric:        Mood and Affect: Mood normal.        Behavior: Behavior normal.        Thought Content: Thought content normal.        Judgment: Judgment normal.     Results for orders placed or performed in visit on 08/28/18  Bayer DCA Hb A1c Waived  Result Value Ref Range   HB A1C (BAYER DCA - WAIVED) 6.9 <7.0 %      Assessment & Plan:   Problem List Items Addressed This Visit      Cardiovascular and Mediastinum   Chronic systolic heart failure (HCC) (Chronic)    Under good control on current regimen. Continue current regimen. Continue to monitor. Call with any concerns. Refills given. Labs checked today.       Relevant Medications   atorvastatin (LIPITOR) 40 MG tablet   digoxin (LANOXIN) 0.125 MG tablet   furosemide (LASIX) 40 MG tablet   metoprolol succinate (TOPROL-XL) 25 MG 24 hr tablet   spironolactone (ALDACTONE) 25 MG tablet   Other Relevant Orders   Comprehensive metabolic panel   CBC with Differential/Platelet   Referral to Chronic Care Management Services   Essential hypertension    Under good control on current regimen. Continue current regimen. Continue to monitor. Call with any concerns. Refills given. Labs checked today.       Relevant Medications   atorvastatin (LIPITOR) 40 MG tablet   digoxin (LANOXIN) 0.125 MG tablet   furosemide (LASIX) 40 MG tablet   metoprolol succinate (TOPROL-XL) 25 MG 24 hr tablet   spironolactone (ALDACTONE) 25 MG tablet     Respiratory   COPD (chronic obstructive pulmonary disease) (HCC)    Lungs clear. Continue inhalers. Call with any concerns.         Digestive   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)    Undergoing  treatment with GI. Continue to monitor. Call with any concerns.       Relevant Medications   valACYclovir (VALTREX) 1000 MG tablet   Sofosbuvir-Velpatasvir (EPCLUSA) 400-100 MG TABS     Endocrine   Hyperthyroidism - Primary    Appointment scheduled for this week with endocrinology. Call with any concerns. Rechecking labs today.      Relevant Medications   metoprolol succinate (TOPROL-XL) 25 MG 24 hr tablet   Other Relevant Orders   Comprehensive metabolic panel   TSH   Referral to Chronic Care Management Services   Diabetes mellitus without complication (HCC)    Going in the wrong direction- up to 6.9 from 5.9. Watch diet and increase exercise. Recheck 3 months.       Relevant Medications  atorvastatin (LIPITOR) 40 MG tablet   Other Relevant Orders   Bayer DCA Hb A1c Waived (Completed)   Comprehensive metabolic panel   Referral to Chronic Care Management Services       Follow up plan: Return in about 3 months (around 11/28/2018) for follow up dm.

## 2018-08-28 NOTE — Assessment & Plan Note (Signed)
Under good control on current regimen. Continue current regimen. Continue to monitor. Call with any concerns. Refills given. Labs checked today.  

## 2018-08-28 NOTE — Assessment & Plan Note (Signed)
Going in the wrong direction- up to 6.9 from 5.9. Watch diet and increase exercise. Recheck 3 months.

## 2018-08-28 NOTE — Assessment & Plan Note (Signed)
Lungs clear. Continue inhalers. Call with any concerns. 

## 2018-08-28 NOTE — Assessment & Plan Note (Signed)
Undergoing treatment with GI. Continue to monitor. Call with any concerns.

## 2018-08-28 NOTE — Assessment & Plan Note (Signed)
Appointment scheduled for this week with endocrinology. Call with any concerns. Rechecking labs today.

## 2018-08-29 LAB — CBC WITH DIFFERENTIAL/PLATELET
Basophils Absolute: 0 10*3/uL (ref 0.0–0.2)
Basos: 1 %
EOS (ABSOLUTE): 0.2 10*3/uL (ref 0.0–0.4)
Eos: 4 %
Hematocrit: 42.5 % (ref 37.5–51.0)
Hemoglobin: 14.9 g/dL (ref 13.0–17.7)
IMMATURE GRANS (ABS): 0 10*3/uL (ref 0.0–0.1)
Immature Granulocytes: 0 %
LYMPHS: 40 %
Lymphocytes Absolute: 1.9 10*3/uL (ref 0.7–3.1)
MCH: 30.3 pg (ref 26.6–33.0)
MCHC: 35.1 g/dL (ref 31.5–35.7)
MCV: 86 fL (ref 79–97)
Monocytes Absolute: 0.5 10*3/uL (ref 0.1–0.9)
Monocytes: 11 %
Neutrophils Absolute: 2.2 10*3/uL (ref 1.4–7.0)
Neutrophils: 44 %
Platelets: 180 10*3/uL (ref 150–450)
RBC: 4.92 x10E6/uL (ref 4.14–5.80)
RDW: 13.9 % (ref 11.6–15.4)
WBC: 4.9 10*3/uL (ref 3.4–10.8)

## 2018-08-29 LAB — COMPREHENSIVE METABOLIC PANEL
ALT: 14 IU/L (ref 0–44)
AST: 14 IU/L (ref 0–40)
Albumin/Globulin Ratio: 1.3 (ref 1.2–2.2)
Albumin: 4 g/dL (ref 3.8–4.8)
Alkaline Phosphatase: 115 IU/L (ref 39–117)
BUN/Creatinine Ratio: 11 (ref 10–24)
BUN: 10 mg/dL (ref 8–27)
Bilirubin Total: 0.8 mg/dL (ref 0.0–1.2)
CO2: 24 mmol/L (ref 20–29)
Calcium: 8.9 mg/dL (ref 8.6–10.2)
Chloride: 97 mmol/L (ref 96–106)
Creatinine, Ser: 0.88 mg/dL (ref 0.76–1.27)
GFR calc Af Amer: 103 mL/min/{1.73_m2} (ref >59)
GFR calc non Af Amer: 89 mL/min/{1.73_m2} (ref >59)
Globulin, Total: 3.1 g/dL (ref 1.5–4.5)
Glucose: 206 mg/dL — ABNORMAL HIGH (ref 65–99)
Potassium: 4 mmol/L (ref 3.5–5.2)
Sodium: 135 mmol/L (ref 134–144)
Total Protein: 7.1 g/dL (ref 6.0–8.5)

## 2018-08-29 LAB — TSH: TSH: 1.63 u[IU]/mL (ref 0.450–4.500)

## 2018-08-30 ENCOUNTER — Ambulatory Visit: Payer: Medicare HMO | Admitting: Endocrinology

## 2018-09-03 ENCOUNTER — Other Ambulatory Visit: Payer: Self-pay | Admitting: Family

## 2018-09-06 ENCOUNTER — Ambulatory Visit: Payer: Self-pay | Admitting: *Deleted

## 2018-09-06 ENCOUNTER — Other Ambulatory Visit: Payer: Self-pay

## 2018-09-06 DIAGNOSIS — E119 Type 2 diabetes mellitus without complications: Secondary | ICD-10-CM

## 2018-09-06 NOTE — Chronic Care Management (AMB) (Signed)
  Care Management Note   Eugene Henderson is a 68 y.o. year old male who is a primary care patient of Zair, Borawski, DO. The CM team was consulted for assistance with chronic disease management and care coordination.   I reached out to [employer] by phone today. Daughter Wendy(DPR) answered the phone and together with her obtained consent.   Mr. [name] was given information about Chronic Care Management services today including:  1. CCM service includes personalized support from designated clinical staff supervised by his physician, including individualized plan of care and coordination with other care providers 2. 24/7 contact phone numbers for assistance for urgent and routine care needs. 3. Service will only be billed when office clinical staff spend 20 minutes or more in a month to coordinate care. 4. Only one practitioner may furnish and bill the service in a calendar month. 5. The patient may stop CCM services at any time (effective at the end of the month) by phone call to the office staff. 6. The patient will be responsible for cost sharing (co-pay) of up to 20% of the service fee (after annual deductible is met). Patient agreed to services and verbal consent obtained.    Review of patient status, including review of consultants reports, relevant laboratory and other test results, and collaboration with appropriate care team members and the patient's provider was performed as part of comprehensive patient evaluation and provision of chronic care management services.    Follow Up Plan: Telephone follow up appointment with CCM team member scheduled for:09/19/18_0 :00 per patient's request   Aryiah Monterosso RN, BSN Nurse Case Manager [facility] Care Management  [phone]) Business Mobile

## 2018-09-06 NOTE — Patient Instructions (Signed)
1. Thank You for allowing the CCM (Chronic Care Management) Team to assist you with your healthcare goals!! We look forward to speaking with you on  09/19/18 at 4:00 2. Please be prepared to review ALL medications at your appointment! If you have a blood sugar meter or a blood pressure monitor at home, we will review those readings as well.  3.  Contact the CCM Team if you have any question or need to reschedule your initial visit.   CCM (Chronic Care Management) Team   Eugene Prien RN, BSN Nurse Care Coordinator  [phone]   Catie [facility] PharmD  Clinical Pharmacist  [phone]   Eula Fried LCSW Clinical Social Worker [phone]       Eugene Henderson was given information about Chronic Care Management services today including:  1. CCM service includes personalized support from designated clinical staff supervised by her physician, including individualized plan of care and coordination with other care providers 2. 24/7 contact phone numbers for assistance for urgent and routine care needs. 3. Service will only be billed when office clinical staff spend 20 minutes or more in a month to coordinate care. 4. Only one practitioner may furnish and bill the service in a calendar month. 5. The patient may stop CCM services at any time (effective at the end of the month) by phone call to the office staff. 6. The patient will be responsible for cost sharing (co-pay) of up to 20% of the service fee (after annual deductible is met).   Patient agreed to services and verbal consent obtained.      Eugene Morse Samiya Mervin RN, BSN Nurse Case Editor, [facility] Care Management  [phone]) Business Mobile

## 2018-09-16 ENCOUNTER — Other Ambulatory Visit: Payer: Self-pay | Admitting: Family

## 2018-09-19 ENCOUNTER — Ambulatory Visit: Payer: Self-pay | Admitting: *Deleted

## 2018-09-19 ENCOUNTER — Telehealth: Payer: Self-pay

## 2018-09-19 NOTE — Chronic Care Management (AMB) (Signed)
  Chronic Care Management   Note  09/19/2018 Name: Jed Massaquoi MRN: [id number] DOB: 02/24/1951  Placed a call to Mr. [name] patient of Yuven, Sahota, DO today to discuss goals for managing their chronic condition of Diabetes. Unable to reach patient at this time, was able to leave a HIPPA compliant voice mail and requested a return call.   Follow up plan: The CM team will reach out to the patient again over the next 14 days.   Ma Rings Decklan Mau RN, BSN Nurse Case Education officer, [facility] Care Management  [phone]) Business Mobile

## 2018-09-28 ENCOUNTER — Telehealth: Payer: Self-pay

## 2018-10-03 ENCOUNTER — Encounter: Payer: Self-pay | Admitting: Endocrinology

## 2018-10-04 ENCOUNTER — Other Ambulatory Visit: Payer: Self-pay

## 2018-10-04 ENCOUNTER — Encounter: Payer: Self-pay | Admitting: Endocrinology

## 2018-10-04 ENCOUNTER — Ambulatory Visit (INDEPENDENT_AMBULATORY_CARE_PROVIDER_SITE_OTHER): Payer: Medicare HMO | Admitting: Endocrinology

## 2018-10-04 DIAGNOSIS — E0590 Thyrotoxicosis, unspecified without thyrotoxic crisis or storm: Secondary | ICD-10-CM | POA: Diagnosis not present

## 2018-10-04 DIAGNOSIS — M2550 Pain in unspecified joint: Secondary | ICD-10-CM | POA: Diagnosis not present

## 2018-10-04 NOTE — Progress Notes (Signed)
Subjective:    Patient ID: Eugene Henderson, male    DOB: 1951-03-15, 68 y.o.   MRN: [id number]  HPI  telehealth visit today via doxy video visit.  Alternatives to telehealth are presented to this patient, and the patient agrees to the telehealth visit. Pt is advised of the cost of the visit, and agrees to this, also.   Patient is at home, and I am at the office.   Persons attending the telehealth visit: the patient and I Pt is referred by Dr Laural Benes, for hyperthyroidism.  Pt reports he was dx'ed with hyperthyroidism in 2016, when he lived in [place].  He has been on tapazole for this, since dx.  He has never had XRT to the anterior neck, or thyroid surgery.  He is uncertain about past thyroid imaging.  He does not consume kelp or any other non-prescribed thyroid medication.  He has never been on amiodarone.  He takes tapazole, 20 mg-BID.  he reports moderate arthralgias, but no assoc muscle weakness.   Past Medical History:  Diagnosis Date  . Arthritis   . Ascending aortic aneurysm (HCC)    a. 09/2017 Stable TAA - 5.1cm.  . Asthma   . Chronic systolic CHF (congestive heart failure) (HCC)    a. EF 25-30% by echo in 07/2016 with cath showing no significant CAD b. 01/2017: EF 30-35% with diffuse HK and moderate MR; c. 07/2017 Echo: EF 30-35%, diff hK. Mild MR, mildly dil LA. PASP .  . Diverticulitis   . Diverticulitis of large intestine with perforation without abscess or bleeding 05/13/2017  . Hypertension   . Hyperthyroidism   . NICM (nonischemic cardiomyopathy) (HCC)   . Noncompliance   . Persistent atrial fibrillation    a. CHA2DS2VASc = 3-->Eliquis (? compliance).    Past Surgical History:  Procedure Laterality Date  . JOINT REPLACEMENT Right   . RIGHT/LEFT HEART CATH AND CORONARY ANGIOGRAPHY N/A 08/02/2016   Procedure: Right/Left Heart Cath and Coronary Angiography;  Surgeon: Iran Ouch, MD;  Location: [facility] INVASIVE CV LAB;  Service: Cardiovascular;  Laterality: N/A;  .  TESTICLE SURGERY     Patient states that he had to have the tube fixed.    Social History   Socioeconomic History  . Marital status: Single    Spouse name: Not on file  . Number of children: Not on file  . Years of education: Not on file  . Highest education level: Not on file  Occupational History  . Occupation: retired  Engineer, production  . Financial resource strain: Not on file  . Food insecurity:    Worry: Not on file    Inability: Not on file  . Transportation needs:    Medical: Not on file    Non-medical: Not on file  Tobacco Use  . Smoking status: Current Some Day Smoker    Packs/day: 0.25    Years: 25.00    Pack years: 6.25    Types: Cigarettes  . Smokeless tobacco: Never Used  Substance and Sexual Activity  . Alcohol use: Yes    Comment: Socially - once a month  . Drug use: No  . Sexual activity: Never  Lifestyle  . Physical activity:    Days per week: Not on file    Minutes per session: Not on file  . Stress: Not on file  Relationships  . Social connections:    Talks on phone: Not on file    Gets together: Not on file    Attends  religious service: Not on file    Active member of club or organization: Not on file    Attends meetings of clubs or organizations: Not on file    Relationship status: Not on file  . Intimate partner violence:    Fear of current or ex partner: Not on file    Emotionally abused: Not on file    Physically abused: Not on file    Forced sexual activity: Not on file  Other Topics Concern  . Not on file  Social History Narrative  . Not on file    Current Outpatient Medications on File Prior to Visit  Medication Sig Dispense Refill  . acetaminophen (TYLENOL) 500 MG tablet Take 500 mg by mouth every 6 (six) hours as needed.    Marland Kitchen albuterol (PROVENTIL HFA;VENTOLIN HFA) 108 (90 Base) MCG/ACT inhaler Inhale 2 puffs into the lungs every 6 (six) hours as needed for wheezing or shortness of breath. 1 Inhaler 3  . atorvastatin (LIPITOR) 40  MG tablet Take 1 tablet (40 mg total) by mouth daily. 90 tablet 1  . budesonide-formoterol (SYMBICORT) 160-4.5 MCG/ACT inhaler Inhale 2 puffs into the lungs 2 (two) times daily. 3 Inhaler 3  . Difluprednate (DUREZOL) 0.05 % EMUL Place 1 drop into the right eye every 4 (four) hours while awake.    . digoxin (LANOXIN) 0.125 MG tablet Take 1 tablet (0.125 mg total) by mouth daily. 90 tablet 1  . ELIQUIS 5 MG TABS tablet TAKE 1 TABLET BY MOUTH TWICE A DAY 180 tablet 4  . feeding supplement (BOOST / RESOURCE BREEZE) LIQD Take 1 Container by mouth 2 (two) times daily between meals. 60 Container 2  . furosemide (LASIX) 40 MG tablet TAKE 1 TABLET (40 MG TOTAL) BY MOUTH DAILY AS NEEDED. FOR SOB/LE EDEMA 90 tablet 1  . gentamicin (GARAMYCIN) 0.3 % ophthalmic solution Place 1 drop into both eyes every 4 (four) hours. 5 mL 0  . methimazole (TAPAZOLE) 10 MG tablet Take 2 tablets (20 mg total) by mouth 2 (two) times daily. 180 tablet 1  . metoprolol succinate (TOPROL-XL) 25 MG 24 hr tablet Take 1 tablet (25 mg total) by mouth daily. 90 tablet 1  . moxifloxacin (VIGAMOX) 0.5 % ophthalmic solution Place 1 drop into the right eye every 4 (four) hours while awake.     . naphazoline-pheniramine (NAPHCON-A) 0.025-0.3 % ophthalmic solution Place 1 drop into both eyes 4 (four) times daily as needed for eye irritation. 15 mL 0  . potassium chloride SA (K-DUR,KLOR-CON) 20 MEQ tablet Take 1 tablet (20 mEq total) by mouth daily. 90 tablet 3  . prednisoLONE acetate (PRED FORTE) 1 % ophthalmic suspension Place 1 drop into the right eye every 2 (two) hours while awake.     . sacubitril-valsartan (ENTRESTO) 24-26 MG Take 1 tablet by mouth 2 (two) times daily.    . Sofosbuvir-Velpatasvir (EPCLUSA) 400-100 MG TABS Take 1 tablet by mouth daily.    Marland Kitchen spironolactone (ALDACTONE) 25 MG tablet Take 0.5 tablets (12.5 mg total) by mouth daily. 45 tablet 1  . valACYclovir (VALTREX) 1000 MG tablet Take 1 tablet by mouth daily.     No  current facility-administered medications on file prior to visit.     No Known Allergies  Family History  Problem Relation Age of Onset  . Diabetes Mother   . Diabetes Father   . Heart disease Brother   . Heart attack Brother   . Diabetes Brother   . Kidney failure Brother   .  Thyroid disease Neg Hx     There were no vitals taken for this visit.    Review of Systems denies weight loss, headache, hoarseness, palpitations, sob, diarrhea, edema, neck swelling, excessive diaphoresis, tremor, anxiety, heat intolerance, easy bruising, and rhinorrhea.  He has chronic monocular vision.     Objective:   Physical Exam    Lab Results  Component Value Date   TSH 1.630 08/28/2018   T4TOTAL 9.0 11/29/2016   I have reviewed outside records, and summarized: Pt was noted to have hyperthyroidism, and referred here.  Other probs addressed were COPD, CHF, HTN, and Hep-C     Assessment & Plan:  Hyperthyroidism, prob due to Grave's Dz: well-controlled.  Arthralgias, new to me: not related to the above.   Patient Instructions  Please continue the same medication.   If ever you have fever while taking methimazole, stop it and call us, even if the reason is obvious, because of the risk of a rare side-effect.   Please come back for a follow-up appointment in 3 months.

## 2018-10-04 NOTE — Patient Instructions (Addendum)
Please continue the same medication.   If ever you have fever while taking methimazole, stop it and call us, even if the reason is obvious, because of the risk of a rare side-effect.   Please come back for a follow-up appointment in 3 months.

## 2018-10-10 ENCOUNTER — Ambulatory Visit: Payer: Self-pay | Admitting: *Deleted

## 2018-10-10 DIAGNOSIS — E119 Type 2 diabetes mellitus without complications: Secondary | ICD-10-CM

## 2018-10-10 NOTE — Patient Instructions (Signed)
Thank you allowing the Chronic Care Management Team to be a part of your care! It was a pleasure speaking with you today!   CCM (Chronic Care Management) Team   Najee Cowens RN, BSN Nurse Care Coordinator  [phone]  Catie [facility] PharmD  Clinical Pharmacist  [phone]  Dickie La LCSW Clinical Social Worker [phone]  Goals Addressed            This Visit's Progress   . Diabetes management (pt-stated)       Current Barriers:  Marland Kitchen Knowledge Deficits related to basic Diabetes pathophysiology and self care/management  Case Manager Clinical Goal(s):  Over the next 90 days, patient will demonstrate improved adherence to prescribed treatment plan for diabetes self care/management as evidenced by:  . adherence to ADA/ carb modified diet  Interventions:  . Provided education to patient about basic DM disease process  Patient Self Care Activities:  . UNABLE to independently adhere to ADA/Carb modified diet  Initial goal documentation         The patient verbalized understanding of instructions provided today and declined a print copy of patient instruction materials.   The CM team will reach out to the patient again over the next 14 days.  The patient has been provided with contact information for the chronic care management team and has been advised to call with any health related questions or concerns.

## 2018-10-10 NOTE — Chronic Care Management (AMB) (Signed)
  Chronic Care Management   Follow Up Note   10/10/2018 Name: Kemaury Quance MRN: [id number] DOB: 1951-02-27  Referred by: Dorcas Carrow, DO Reason for referral : Chronic Care Management (DM)   Corney Coppin is a 68 y.o. year old male who is a primary care patient of Jayni, [place], DO. The CCM team was consulted for assistance with chronic disease management and care coordination needs.    Review of patient status, including review of consultants reports, relevant laboratory and other test results, and collaboration with appropriate care team members and the patient's provider was performed as part of comprehensive patient evaluation and provision of chronic care management services.   Spoke with daughter and daughter requested to call me back with her dad on the line. Discussed a little bit about diabetic diet but need to f/u.     Goals Addressed            This Visit's Progress   . Diabetes management (pt-stated)       Current Barriers:  Marland Kitchen Knowledge Deficits related to basic Diabetes pathophysiology and self care/management  Case Manager Clinical Goal(s):  Over the next 90 days, patient will demonstrate improved adherence to prescribed treatment plan for diabetes self care/management as evidenced by:  . adherence to ADA/ carb modified diet  Interventions:  . Provided education to patient about basic DM disease process  Patient Self Care Activities:  . UNABLE to independently adhere to ADA/Carb modified diet  Initial goal documentation          The CM team will reach out to the patient again over the next 14 days.  The patient has been provided with contact information for the chronic care management team and has been advised to call with any health related questions or concerns.    Ma Rings Adelaida Reindel RN, BSN Nurse Case Education officer, [facility]  [phone]) Business Mobile

## 2018-10-26 ENCOUNTER — Telehealth: Payer: Self-pay

## 2018-10-30 ENCOUNTER — Other Ambulatory Visit: Payer: Self-pay | Admitting: Family Medicine

## 2018-11-01 ENCOUNTER — Telehealth: Payer: Self-pay

## 2018-11-04 ENCOUNTER — Other Ambulatory Visit: Payer: Self-pay | Admitting: Family Medicine

## 2018-11-08 ENCOUNTER — Ambulatory Visit (INDEPENDENT_AMBULATORY_CARE_PROVIDER_SITE_OTHER): Payer: Medicare HMO | Admitting: *Deleted

## 2018-11-08 DIAGNOSIS — E119 Type 2 diabetes mellitus without complications: Secondary | ICD-10-CM

## 2018-11-09 NOTE — Chronic Care Management (AMB) (Signed)
  Chronic Care Management   Follow Up Note   11/09/2018 Name: Eugene Henderson MRN: [id number] DOB: [birth year]  Referred by: Dorcas Carrow, DO Reason for referral : Chronic Care Management (DM diet)   [name] is a 68 y.o. year old male who is a primary care patient of Keil, Corazza, DO. The CCM team was consulted for assistance with chronic disease management and care coordination needs.    Review of patient status, including review of consultants reports, relevant laboratory and other test results, and collaboration with appropriate care team members and the patient's provider was performed as part of comprehensive patient evaluation and provision of chronic care management services.    Goals Addressed            This Visit's Progress   . Diabetes management (pt-stated)       Current Barriers:  Marland Kitchen Knowledge Deficits related to basic Diabetes pathophysiology and self care/management  Case Manager Clinical Goal(s):  Over the next 90 days, patient will demonstrate improved adherence to prescribed treatment plan for diabetes self care/management as evidenced by:  . adherence to ADA/ carb modified diet  Interventions:  . Provided education to patient about basic DM disease process . Discussed plans with patient for ongoing care management follow up and provided patient with direct contact information for care management team  . Daughter Toniann Fail stated patient has stopped putting sugar in coffee and trying to do better with his diet. . Patient not using a glucometer at this time related to he was sharing with daughter and theirs broke . Collaborated with PCP to request a script for new meter.   Patient Self Care Activities:  . UNABLE to independently adhere to ADA/Carb modified diet  Please see past updates related to this goal by clicking on the "Past Updates" button in the selected goal           The care management team will reach out to the patient again over the  next 30 days.  The patient has been provided with contact information for the care management team and has been advised to call with any health related questions or concerns.    Ma Rings Isaiyah Feldhaus RN, BSN Nurse Case Education officer, [facility]  [phone]) Business Mobile

## 2018-11-09 NOTE — Patient Instructions (Signed)
Thank you allowing the Chronic Care Management Team to be a part of your care! It was a pleasure speaking with you today!   CCM (Chronic Care Management) Team   Anthon Harpole RN, BSN Nurse Care Coordinator  [phone]  Catie [facility] PharmD  Clinical Pharmacist  [phone]  Dickie La LCSW Clinical Social Worker [phone]  Goals Addressed            This Visit's Progress   . Diabetes management (pt-stated)       Current Barriers:  Marland Kitchen Knowledge Deficits related to basic Diabetes pathophysiology and self care/management  Case Manager Clinical Goal(s):  Over the next 90 days, patient will demonstrate improved adherence to prescribed treatment plan for diabetes self care/management as evidenced by:  . adherence to ADA/ carb modified diet  Interventions:  . Provided education to patient about basic DM disease process . Discussed plans with patient for ongoing care management follow up and provided patient with direct contact information for care management team  . Daughter Toniann Fail stated patient has stopped putting sugar in coffee and trying to do better with his diet. . Patient not using a glucometer at this time related to he was sharing with daughter and theirs broke . Collaborated with PCP to request a script for new meter.   Patient Self Care Activities:  . UNABLE to independently adhere to ADA/Carb modified diet  Please see past updates related to this goal by clicking on the "Past Updates" button in the selected goal          The patient verbalized understanding of instructions provided today and declined a print copy of patient instruction materials.   The care management team will reach out to the patient again over the next 30 days.  The patient has been provided with contact information for the care management team and has been advised to call with any health related questions or concerns.

## 2018-11-26 ENCOUNTER — Other Ambulatory Visit: Payer: Self-pay | Admitting: Family

## 2018-11-26 ENCOUNTER — Other Ambulatory Visit: Payer: Self-pay | Admitting: Family Medicine

## 2018-11-28 ENCOUNTER — Telehealth: Payer: Self-pay

## 2018-11-28 ENCOUNTER — Ambulatory Visit: Payer: Medicare HMO | Admitting: Family Medicine

## 2018-11-29 ENCOUNTER — Telehealth: Payer: Self-pay

## 2018-12-04 ENCOUNTER — Ambulatory Visit: Payer: Medicare HMO | Admitting: Family Medicine

## 2018-12-11 ENCOUNTER — Encounter: Payer: Self-pay | Admitting: Family Medicine

## 2018-12-11 ENCOUNTER — Ambulatory Visit (INDEPENDENT_AMBULATORY_CARE_PROVIDER_SITE_OTHER): Payer: Medicare HMO | Admitting: Family Medicine

## 2018-12-11 ENCOUNTER — Other Ambulatory Visit: Payer: Self-pay

## 2018-12-11 VITALS — BP 149/93 | HR 69 | Temp 98.1°F | Ht 73.0 in | Wt 280.2 lb

## 2018-12-11 DIAGNOSIS — I5022 Chronic systolic (congestive) heart failure: Secondary | ICD-10-CM | POA: Diagnosis not present

## 2018-12-11 DIAGNOSIS — G8929 Other chronic pain: Secondary | ICD-10-CM | POA: Diagnosis not present

## 2018-12-11 DIAGNOSIS — E119 Type 2 diabetes mellitus without complications: Secondary | ICD-10-CM | POA: Diagnosis not present

## 2018-12-11 DIAGNOSIS — B182 Chronic viral hepatitis C: Secondary | ICD-10-CM | POA: Diagnosis not present

## 2018-12-11 DIAGNOSIS — I10 Essential (primary) hypertension: Secondary | ICD-10-CM | POA: Diagnosis not present

## 2018-12-11 DIAGNOSIS — E0590 Thyrotoxicosis, unspecified without thyrotoxic crisis or storm: Secondary | ICD-10-CM | POA: Diagnosis not present

## 2018-12-11 DIAGNOSIS — M25561 Pain in right knee: Secondary | ICD-10-CM | POA: Diagnosis not present

## 2018-12-11 DIAGNOSIS — I4819 Other persistent atrial fibrillation: Secondary | ICD-10-CM

## 2018-12-11 MED ORDER — SPIRONOLACTONE 25 MG PO TABS: 25.0000 mg | ORAL_TABLET | Freq: Every day | ORAL | 1 refills | Status: DC

## 2018-12-11 MED ORDER — METHIMAZOLE 10 MG PO TABS: 20.0000 mg | ORAL_TABLET | Freq: Two times a day (BID) | ORAL | 3 refills | Status: DC

## 2018-12-11 MED ORDER — ATORVASTATIN CALCIUM 40 MG PO TABS: 40.0000 mg | ORAL_TABLET | Freq: Every day | ORAL | 0 refills | Status: DC

## 2018-12-11 MED ORDER — METFORMIN HCL ER 500 MG PO TB24: 500.0000 mg | ORAL_TABLET | Freq: Every day | ORAL | 3 refills | Status: DC

## 2018-12-11 NOTE — Assessment & Plan Note (Signed)
On eliquis. Needs follow up with cardiology- referral generated today. Continue to monitor.

## 2018-12-11 NOTE — Progress Notes (Signed)
BP (!) 149/93   Pulse 69   Temp 98.1 F (36.7 C)   Ht 6\' 1"  (1.854 m)   Wt 280 lb 4 oz (127.1 kg)   SpO2 98%   BMI 36.97 kg/m    Subjective:    Patient ID: Eugene Henderson, male    DOB: 04/24/1951, 68 y.o.   MRN: [id number]  HPI: Eugene Henderson is a 68 y.o. male  Chief Complaint  Patient presents with  . Diabetes   DIABETES Hypoglycemic episodes:no Polydipsia/polyuria: no Visual disturbance: no Chest pain: no Paresthesias: no Glucose Monitoring: no  Accucheck frequency: Not Checking Taking Insulin?: no Blood Pressure Monitoring: not checking Retinal Examination: Not up to Date Foot Exam: Up to Date Diabetic Education: Completed Pneumovax: Up to Date Influenza: Up to Date Aspirin: no   Saw Dr. Everardo AllEllison in April- thyroid doing well. He is pleased with his treatment. To continue current regimen. Due to follow up with him in July.  HYPERTENSION Hypertension status: stable  Satisfied with current treatment? yes Duration of hypertension: chronic BP monitoring frequency:  not checking BP medication side effects:  no Medication compliance: fair compliance Previous BP meds: digoxin, lasix, metoprolol, entresto, spironalactone Aspirin: no Recurrent headaches: no Visual changes: no Palpitations: no Dyspnea: no Chest pain: no Lower extremity edema: no Dizzy/lightheaded: no   Relevant past medical, surgical, family and social history reviewed and updated as indicated. Interim medical history since our last visit reviewed. Allergies and medications reviewed and updated.  Review of Systems  Constitutional: Negative.   Respiratory: Negative.   Cardiovascular: Negative.   Gastrointestinal: Negative.   Neurological: Negative.   Psychiatric/Behavioral: Negative.     Per HPI unless specifically indicated above     Objective:    BP (!) 149/93   Pulse 69   Temp 98.1 F (36.7 C)   Ht 6\' 1"  (1.854 m)   Wt 280 lb 4 oz (127.1 kg)   SpO2 98%   BMI 36.97 kg/m    Wt Readings from Last 3 Encounters:  12/11/18 280 lb 4 oz (127.1 kg)  05/16/18 259 lb (117.5 kg)  05/08/18 268 lb 3.2 oz (121.7 kg)    Physical Exam Vitals signs and nursing note reviewed.  Constitutional:      General: He is not in acute distress.    Appearance: Normal appearance. He is not ill-appearing, toxic-appearing or diaphoretic.  HENT:     Head: Normocephalic and atraumatic.     Right Ear: External ear normal.     Left Ear: External ear normal.     Nose: Nose normal.     Mouth/Throat:     Mouth: Mucous membranes are moist.     Pharynx: Oropharynx is clear.  Eyes:     General: No scleral icterus.       Right eye: No discharge.        Left eye: No discharge.     Extraocular Movements: Extraocular movements intact.     Conjunctiva/sclera: Conjunctivae normal.     Pupils: Pupils are equal, round, and reactive to light.  Neck:     Musculoskeletal: Normal range of motion and neck supple.  Cardiovascular:     Rate and Rhythm: Normal rate. Rhythm irregular.     Pulses: Normal pulses.     Heart sounds: Murmur present. No friction rub. No gallop.   Pulmonary:     Effort: Pulmonary effort is normal. No respiratory distress.     Breath sounds: Normal breath sounds. No stridor. No wheezing, rhonchi  or rales.  Chest:     Chest wall: No tenderness.  Musculoskeletal: Normal range of motion.  Skin:    General: Skin is warm and dry.     Capillary Refill: Capillary refill takes less than 2 seconds.     Coloration: Skin is not jaundiced or pale.     Findings: No bruising, erythema, lesion or rash.  Neurological:     General: No focal deficit present.     Mental Status: He is alert and oriented to person, place, and time. Mental status is at baseline.  Psychiatric:        Mood and Affect: Mood normal.        Behavior: Behavior normal.        Thought Content: Thought content normal.        Judgment: Judgment normal.     Results for orders placed or performed in visit on  08/28/18  Bayer DCA Hb A1c Waived  Result Value Ref Range   HB A1C (BAYER DCA - WAIVED) 6.9 <7.0 %  Comprehensive metabolic panel  Result Value Ref Range   Glucose 206 (H) 65 - 99 mg/dL   BUN 10 8 - 27 mg/dL   Creatinine, Ser 7.90 0.76 - 1.27 mg/dL   GFR calc non Af Amer 89 >59 mL/min/1.73   GFR calc Af Amer 103 >59 mL/min/1.73   BUN/Creatinine Ratio 11 10 - 24   Sodium 135 134 - 144 mmol/L   Potassium 4.0 3.5 - 5.2 mmol/L   Chloride 97 96 - 106 mmol/L   CO2 24 20 - 29 mmol/L   Calcium 8.9 8.6 - 10.2 mg/dL   Total Protein 7.1 6.0 - 8.5 g/dL   Albumin 4.0 3.8 - 4.8 g/dL   Globulin, Total 3.1 1.5 - 4.5 g/dL   Albumin/Globulin Ratio 1.3 1.2 - 2.2   Bilirubin Total 0.8 0.0 - 1.2 mg/dL   Alkaline Phosphatase 115 39 - 117 IU/L   AST 14 0 - 40 IU/L   ALT 14 0 - 44 IU/L  CBC with Differential/Platelet  Result Value Ref Range   WBC 4.9 3.4 - 10.8 x10E3/uL   RBC 4.92 4.14 - 5.80 x10E6/uL   Hemoglobin 14.9 13.0 - 17.7 g/dL   Hematocrit 24.0 97.3 - 51.0 %   MCV 86 79 - 97 fL   MCH 30.3 26.6 - 33.0 pg   MCHC 35.1 31.5 - 35.7 g/dL   RDW 53.2 99.2 - 42.6 %   Platelets 180 150 - 450 x10E3/uL   Neutrophils 44 Not Estab. %   Lymphs 40 Not Estab. %   Monocytes 11 Not Estab. %   Eos 4 Not Estab. %   Basos 1 Not Estab. %   Neutrophils Absolute 2.2 1.4 - 7.0 x10E3/uL   Lymphocytes Absolute 1.9 0.7 - 3.1 x10E3/uL   Monocytes Absolute 0.5 0.1 - 0.9 x10E3/uL   EOS (ABSOLUTE) 0.2 0.0 - 0.4 x10E3/uL   Basophils Absolute 0.0 0.0 - 0.2 x10E3/uL   Immature Granulocytes 0 Not Estab. %   Immature Grans (Abs) 0.0 0.0 - 0.1 x10E3/uL  TSH  Result Value Ref Range   TSH 1.630 0.450 - 4.500 uIU/mL      Assessment & Plan:   Problem List Items Addressed This Visit      Cardiovascular and Mediastinum   Chronic systolic heart failure (HCC) (Chronic)    Euvolemic today. Needs follow up with cardiology- referral generated today. Continue to monitor.       Relevant Medications  atorvastatin  (LIPITOR) 40 MG tablet   spironolactone (ALDACTONE) 25 MG tablet   Other Relevant Orders   Ambulatory referral to Cardiology   Persistent atrial fibrillation    On eliquis. Needs follow up with cardiology- referral generated today. Continue to monitor.       Relevant Medications   atorvastatin (LIPITOR) 40 MG tablet   spironolactone (ALDACTONE) 25 MG tablet   Other Relevant Orders   Ambulatory referral to Cardiology   Essential hypertension    Elevated today with 20lb weight gain- euvolemic today, likely due to poor eating habits. Will increase spironalactone, get him into see cardiology and recheck 1 month. Call with any concerns.       Relevant Medications   atorvastatin (LIPITOR) 40 MG tablet   spironolactone (ALDACTONE) 25 MG tablet   Other Relevant Orders   Comprehensive metabolic panel     Digestive   Chronic hepatitis C without hepatic coma ([place])    Went through 3 months of treatment, but doesn't remember having his labs rechecked. Will check qualitative hep C today. Await results. Call with any concerns.       Relevant Orders   HCV RNA quant     Endocrine   Hyperthyroidism    Follows with endocrine- doing well on current regimen. Continue to monitor. Call with any concerns.       Relevant Medications   methimazole (TAPAZOLE) 10 MG tablet   Diabetes mellitus without complication (HCC) - Primary    Weight up 20lbs since last visit. A1c up from 6.9 to 7.5- will start metformin 500mg  ER BID and recheck tolerance 1 month. Call with any concerns.       Relevant Medications   atorvastatin (LIPITOR) 40 MG tablet   metFORMIN (GLUCOPHAGE XR) 500 MG 24 hr tablet   Other Relevant Orders   Bayer DCA Hb A1c Waived   Comprehensive metabolic panel   Lipid Panel w/o Chol/HDL Ratio   Microalbumin, Urine Waived     Other   Morbid obesity (HCC)    Due to DM and HTN. Does not need to be adding boost to his diet. Stop them. Recheck 1 month. Goal of losing 1-2lbs per week.        Relevant Medications   metFORMIN (GLUCOPHAGE XR) 500 MG 24 hr tablet   Chronic pain of right knee    Has been bothering him. Will get him into see ortho again. Referral generated. Call with any concerns.       Relevant Orders   Ambulatory referral to Orthopedic Surgery       Follow up plan: Return in about 4 weeks (around 01/08/2019) for follow up BP.

## 2018-12-11 NOTE — Assessment & Plan Note (Signed)
Has been bothering him. Will get him into see ortho again. Referral generated. Call with any concerns.

## 2018-12-11 NOTE — Assessment & Plan Note (Signed)
Due to DM and HTN. Does not need to be adding boost to his diet. Stop them. Recheck 1 month. Goal of losing 1-2lbs per week.

## 2018-12-11 NOTE — Assessment & Plan Note (Signed)
Went through 3 months of treatment, but doesn't remember having his labs rechecked. Will check qualitative hep C today. Await results. Call with any concerns.

## 2018-12-11 NOTE — Assessment & Plan Note (Signed)
Elevated today with 20lb weight gain- euvolemic today, likely due to poor eating habits. Will increase spironalactone, get him into see cardiology and recheck 1 month. Call with any concerns.

## 2018-12-11 NOTE — Assessment & Plan Note (Signed)
Euvolemic today. Needs follow up with cardiology- referral generated today. Continue to monitor.

## 2018-12-11 NOTE — Assessment & Plan Note (Addendum)
Weight up 20lbs since last visit. A1c up from 6.9 to 7.5- will start metformin 500mg  ER BID and recheck tolerance 1 month. Call with any concerns.

## 2018-12-11 NOTE — Assessment & Plan Note (Signed)
Follows with endocrine- doing well on current regimen. Continue to monitor. Call with any concerns.

## 2018-12-12 LAB — COMPREHENSIVE METABOLIC PANEL
ALT: 13 IU/L (ref 0–44)
AST: 15 IU/L (ref 0–40)
Albumin/Globulin Ratio: 1.1 — ABNORMAL LOW (ref 1.2–2.2)
Albumin: 4 g/dL (ref 3.8–4.8)
Alkaline Phosphatase: 100 IU/L (ref 39–117)
BUN/Creatinine Ratio: 10 (ref 10–24)
BUN: 7 mg/dL — ABNORMAL LOW (ref 8–27)
Bilirubin Total: 0.8 mg/dL (ref 0.0–1.2)
CO2: 23 mmol/L (ref 20–29)
Calcium: 8.8 mg/dL (ref 8.6–10.2)
Chloride: 100 mmol/L (ref 96–106)
Creatinine, Ser: 0.73 mg/dL — ABNORMAL LOW (ref 0.76–1.27)
GFR calc Af Amer: 111 mL/min/{1.73_m2} (ref >59)
GFR calc non Af Amer: 96 mL/min/{1.73_m2} (ref >59)
Globulin, Total: 3.5 g/dL (ref 1.5–4.5)
Glucose: 130 mg/dL — ABNORMAL HIGH (ref 65–99)
Potassium: 4.1 mmol/L (ref 3.5–5.2)
Sodium: 137 mmol/L (ref 134–144)
Total Protein: 7.5 g/dL (ref 6.0–8.5)

## 2018-12-12 LAB — MICROALBUMIN, URINE WAIVED
Creatinine, Urine Waived: 200 mg/dL (ref 10–300)
Microalb, Ur Waived: 30 mg/L — ABNORMAL HIGH (ref 0–19)
Microalb/Creat Ratio: <30 mg/g (ref <30)

## 2018-12-12 LAB — BAYER DCA HB A1C WAIVED: HB A1C (BAYER DCA - WAIVED): 7.5 % — ABNORMAL HIGH (ref <7.0)

## 2018-12-12 LAB — LIPID PANEL W/O CHOL/HDL RATIO
Cholesterol, Total: 82 mg/dL — ABNORMAL LOW (ref 100–199)
HDL: 53 mg/dL (ref >39)
LDL Calculated: 12 mg/dL (ref 0–99)
Triglycerides: 86 mg/dL (ref 0–149)
VLDL Cholesterol Cal: 17 mg/dL (ref 5–40)

## 2018-12-20 ENCOUNTER — Telehealth: Payer: Self-pay

## 2018-12-21 ENCOUNTER — Telehealth: Payer: Self-pay

## 2019-01-02 ENCOUNTER — Telehealth: Payer: Self-pay

## 2019-01-11 ENCOUNTER — Ambulatory Visit: Payer: Medicare HMO | Admitting: Family Medicine

## 2019-01-11 ENCOUNTER — Encounter: Payer: Self-pay | Admitting: Family Medicine

## 2019-01-11 NOTE — Progress Notes (Deleted)
   There were no vitals taken for this visit.   Subjective:    Patient ID: Eugene Henderson, male    DOB: June 06, 1951, 68 y.o.   MRN: [id number]  HPI: Eugene Henderson is a 68 y.o. male  No chief complaint on file.  HYPERTENSION Hypertension status: {Blank single:19197::"controlled","uncontrolled","better","worse","exacerbated","stable"}  Satisfied with current treatment? {Blank single:19197::"yes","no"} Duration of hypertension: {Blank single:19197::"chronic","months","years"} BP monitoring frequency:  {Blank single:19197::"not checking","rarely","daily","weekly","monthly","a few times a day","a few times a week","a few times a month"} BP range:  BP medication side effects:  {Blank single:19197::"yes","no"} Medication compliance: {Blank single:19197::"excellent compliance","good compliance","fair compliance","poor compliance"} Previous BP meds:{Blank ZJQBHALP:37902::"IOXB","DZHGDJMEQA","STMHDQQIWL/NLGXQJJHER","DEYCXKGY","JEHUDJSHFW","YOVZCHYIFO/YDXA","JOINOMVEHM (bystolic)","carvedilol","chlorthalidone","clonidine","diltiazem","exforge HCT","HCTZ","irbesartan (avapro)","labetalol","lisinopril","lisinopril-HCTZ","losartan (cozaar)","methyldopa","nifedipine","olmesartan (benicar)","olmesartan-HCTZ","quinapril","ramipril","spironalactone","tekturna","valsartan","valsartan-HCTZ","verapamil"} Aspirin: {Blank single:19197::"yes","no"} Recurrent headaches: {Blank single:19197::"yes","no"} Visual changes: {Blank single:19197::"yes","no"} Palpitations: {Blank single:19197::"yes","no"} Dyspnea: {Blank single:19197::"yes","no"} Chest pain: {Blank single:19197::"yes","no"} Lower extremity edema: {Blank single:19197::"yes","no"} Dizzy/lightheaded: {Blank single:19197::"yes","no"}  Relevant past medical, surgical, family and social history reviewed and updated as indicated. Interim medical history since our last visit reviewed. Allergies and medications reviewed and updated.  Review of Systems  Per  HPI unless specifically indicated above     Objective:    There were no vitals taken for this visit.  Wt Readings from Last 3 Encounters:  12/11/18 280 lb 4 oz (127.1 kg)  05/16/18 259 lb (117.5 kg)  05/08/18 268 lb 3.2 oz (121.7 kg)    Physical Exam  Results for orders placed or performed in visit on 12/11/18  Bayer DCA Hb A1c Waived  Result Value Ref Range   HB A1C (BAYER DCA - WAIVED) 7.5 (H) <7.0 %  Comprehensive metabolic panel  Result Value Ref Range   Glucose 130 (H) 65 - 99 mg/dL   BUN 7 (L) 8 - 27 mg/dL   Creatinine, Ser 0.73 (L) 0.76 - 1.27 mg/dL   GFR calc non Af Amer 96 >59 mL/min/1.73   GFR calc Af Amer 111 >59 mL/min/1.73   BUN/Creatinine Ratio 10 10 - 24   Sodium 137 134 - 144 mmol/L   Potassium 4.1 3.5 - 5.2 mmol/L   Chloride 100 96 - 106 mmol/L   CO2 23 20 - 29 mmol/L   Calcium 8.8 8.6 - 10.2 mg/dL   Total Protein 7.5 6.0 - 8.5 g/dL   Albumin 4.0 3.8 - 4.8 g/dL   Globulin, Total 3.5 1.5 - 4.5 g/dL   Albumin/Globulin Ratio 1.1 (L) 1.2 - 2.2   Bilirubin Total 0.8 0.0 - 1.2 mg/dL   Alkaline Phosphatase 100 39 - 117 IU/L   AST 15 0 - 40 IU/L   ALT 13 0 - 44 IU/L  Lipid Panel w/o Chol/HDL Ratio  Result Value Ref Range   Cholesterol, Total 82 (L) 100 - 199 mg/dL   Triglycerides 86 0 - 149 mg/dL   HDL 53 >39 mg/dL   VLDL Cholesterol Cal 17 5 - 40 mg/dL   LDL Calculated 12 0 - 99 mg/dL  Microalbumin, Urine Waived  Result Value Ref Range   Microalb, Ur Waived 30 (H) 0 - 19 mg/L   Creatinine, Urine Waived 200 10 - 300 mg/dL   Microalb/Creat Ratio <30 <30 mg/g      Assessment & Plan:   Problem List Items Addressed This Visit    None       Follow up plan: No follow-ups on file.

## 2019-01-23 ENCOUNTER — Ambulatory Visit (INDEPENDENT_AMBULATORY_CARE_PROVIDER_SITE_OTHER): Payer: Medicare HMO | Admitting: *Deleted

## 2019-01-23 DIAGNOSIS — E119 Type 2 diabetes mellitus without complications: Secondary | ICD-10-CM

## 2019-01-23 DIAGNOSIS — I10 Essential (primary) hypertension: Secondary | ICD-10-CM | POA: Diagnosis not present

## 2019-01-23 NOTE — Chronic Care Management (AMB) (Signed)
Chronic Care Management   Follow Up Note   01/23/2019 Name: Eugene Henderson MRN: [id number] DOB: 09-27-1950  Referred by: Valerie Roys, DO Reason for referral : No chief complaint on file.   Eugene Henderson is a 68 y.o. year old male who is a primary care patient of Antoinette, Haskett, DO. The CCM team was consulted for assistance with chronic disease management and care coordination needs.    Review of patient status, including review of consultants reports, relevant laboratory and other test results, and collaboration with appropriate care team members and the patient's provider was performed as part of comprehensive patient evaluation and provision of chronic care management services.    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 See Care Plan for related entries.   Outpatient Encounter Medications as of 01/23/2019  Medication Sig Note  . acetaminophen (TYLENOL) 500 MG tablet Take 500 mg by mouth every 6 (six) hours as needed.   Marland Kitchen albuterol (PROVENTIL HFA;VENTOLIN HFA) 108 (90 Base) MCG/ACT inhaler Inhale 2 puffs into the lungs every 6 (six) hours as needed for wheezing or shortness of breath.   Marland Kitchen atorvastatin (LIPITOR) 40 MG tablet Take 1 tablet (40 mg total) by mouth daily.   . budesonide-formoterol (SYMBICORT) 160-4.5 MCG/ACT inhaler Inhale 2 puffs into the lungs 2 (two) times daily.   . Difluprednate (DUREZOL) 0.05 % EMUL Place 1 drop into the right eye every 4 (four) hours while awake.   . digoxin (LANOXIN) 0.125 MG tablet Take 1 tablet (0.125 mg total) by mouth daily.   Marland Kitchen ELIQUIS 5 MG TABS tablet TAKE 1 TABLET BY MOUTH TWICE A DAY   . furosemide (LASIX) 40 MG tablet TAKE 1 TABLET (40 MG TOTAL) BY MOUTH DAILY AS NEEDED. FOR SOB/LE EDEMA   . gentamicin (GARAMYCIN) 0.3 % ophthalmic solution Place 1 drop into both eyes every 4 (four) hours.   . metFORMIN (GLUCOPHAGE XR) 500 MG 24 hr tablet Take 1 tablet (500 mg total) by mouth  daily with breakfast.   . methimazole (TAPAZOLE) 10 MG tablet Take 2 tablets (20 mg total) by mouth 2 (two) times daily.   . metoprolol succinate (TOPROL-XL) 25 MG 24 hr tablet Take 1 tablet (25 mg total) by mouth daily.   Marland Kitchen moxifloxacin (VIGAMOX) 0.5 % ophthalmic solution Place 1 drop into the right eye every 4 (four) hours while awake.  05/15/2017: Patient reports initially prescribed as 1 DROP RIGHT EYE EVERY HOUR WHILE AWAKE FOR 24 HOURS. Patient had appointment Wednesday the 5th and provider did not indicate any change in use. Patient reports using 1 DROP IN RIGHT EYE EVERY 2-3 HOURS since then. Reports having another appointment in the coming week.   . naphazoline-pheniramine (NAPHCON-A) 0.025-0.3 % ophthalmic solution Place 1 drop into both eyes 4 (four) times daily as needed for eye irritation.   . potassium chloride SA (K-DUR,KLOR-CON) 20 MEQ tablet Take 1 tablet (20 mEq total) by mouth daily.   . prednisoLONE acetate (PRED FORTE) 1 % ophthalmic suspension Place 1 drop into the right eye every 2 (two) hours while awake.    . sacubitril-valsartan (ENTRESTO) 24-26 MG Take 1 tablet by mouth 2 (two) times daily.   . Sofosbuvir-Velpatasvir (EPCLUSA) 400-100 MG TABS Take 1 tablet by mouth daily.   Marland Kitchen spironolactone (ALDACTONE) 25 MG tablet Take 1 tablet (25 mg total) by mouth daily.   . valACYclovir (VALTREX) 1000 MG tablet Take 1 tablet by mouth daily.    No  facility-administered encounter medications on file as of 01/23/2019.      Goals Addressed            This Visit's Progress   . Diabetes management (pt-stated)       Current Barriers:  Marland Kitchen Knowledge Deficits related to basic Diabetes pathophysiology and self care/management . Financial Constraints  Case Manager Clinical Goal(s):  Over the next 90 days, patient will demonstrate improved adherence to prescribed treatment plan for diabetes self care/management as evidenced by:  . adherence to ADA/ carb modified diet  Interventions:  .  Provided education to patient about basic DM disease process . Discussed plans with patient for ongoing care management follow up and provided patient with direct contact information for care management team  . Daughter Toniann Fail stated patient has stopped putting sugar in coffee and trying to do better with his diet. . Patient not using a glucometer at this time related to he was sharing with daughter and theirs broke . Collaborated with PCP to request a script for new meter. . Daughter states patient could really benefit from meals on wheels related to financial strain. She is the care giver and has her own health issues . C-3 referral for Meals on Wheels  . Pharmacy referral, patient with no glucometer and polypharmacy   Patient Self Care Activities:  . UNABLE to independently adhere to ADA/Carb modified diet  Please see past updates related to this goal by clicking on the "Past Updates" button in the selected goal           The care management team will reach out to the patient again over the next 30 days.  The patient has been provided with contact information for the care management team and has been advised to call with any health related questions or concerns.   Ma Rings Sanjeev Main RN, BSN Nurse Case Education officer, [facility]  [phone]) Business Mobile

## 2019-01-27 NOTE — Patient Instructions (Signed)
Thank you allowing the Chronic Care Management Team to be a part of your care! It was a pleasure speaking with you today!   CCM (Chronic Care Management) Team   Johnnie Moten RN, BSN Nurse Care Coordinator  [phone]  Catie [facility] PharmD  Clinical Pharmacist  [phone]  Eula Fried LCSW Clinical Social Worker [phone]  Goals Addressed            This Visit's Progress   . Diabetes management (pt-stated)       Current Barriers:  Marland Kitchen Knowledge Deficits related to basic Diabetes pathophysiology and self care/management . Financial Constraints  Case Manager Clinical Goal(s):  Over the next 90 days, patient will demonstrate improved adherence to prescribed treatment plan for diabetes self care/management as evidenced by:  . adherence to ADA/ carb modified diet  Interventions:  . Provided education to patient about basic DM disease process . Discussed plans with patient for ongoing care management follow up and provided patient with direct contact information for care management team  . Daughter [name] stated patient has stopped putting sugar in coffee and trying to do better with his diet. . Patient not using a glucometer at this time related to he was sharing with daughter and theirs broke . Collaborated with PCP to request a script for new meter. . Daughter states patient could really benefit from meals on wheels related to financial strain. She is the care giver and has her own health issues . C-3 referral for Meals on Wheels    Patient Self Care Activities:  . UNABLE to independently adhere to ADA/Carb modified diet  Please see past updates related to this goal by clicking on the "Past Updates" button in the selected goal          The patient verbalized understanding of instructions provided today and declined a print copy of patient instruction materials.   The patient has been provided with contact information for the care management team and has been  advised to call with any health related questions or concerns.

## 2019-02-03 ENCOUNTER — Other Ambulatory Visit: Payer: Self-pay | Admitting: Family Medicine

## 2019-02-03 NOTE — Telephone Encounter (Signed)
Requested medication (s) are due for refill today: yes  Requested medication (s) are on the active medication list: yes  Last refill:  08/28/2018  Future visit scheduled: yes  Notes to clinic:  Dig level needed. Last check 07/14/2017    Requested Prescriptions  Pending Prescriptions Disp Refills   digoxin (LANOXIN) 0.125 MG tablet [Pharmacy Med Name: DIGOXIN 125 MCG TABLET] 90 tablet 0    Sig: TAKE 1 TABLET BY MOUTH DAILY     Cardiovascular:  Antiarrhythmic Agents - digoxin Failed - 02/03/2019  9:38 AM      Failed - Cr in normal range and within 180 days    Creatinine, Ser  Date Value Ref Range Status  12/11/2018 0.73 (L) 0.76 - 1.27 mg/dL Final         Failed - Digoxin (serum) in normal range and within 180 days    Digoxin Level  Date Value Ref Range Status  07/14/2017 0.4 (L) 0.8 - 2.0 ng/mL Final    Comment:    Performed at [facility], [address], [place], [place] [address]         [place] in normal range and within 180 days    Calcium  Date Value Ref Range Status  12/11/2018 8.8 8.6 - 10.2 mg/dL Final         Passed - K in normal range and within 180 days    Potassium  Date Value Ref Range Status  12/11/2018 4.1 3.5 - 5.2 mmol/L Final         Passed - Last Heart Rate in normal range    Pulse Readings from Last 1 Encounters:  12/11/18 69         Passed - Valid encounter within last 6 months    Recent Outpatient Visits          1 month ago Diabetes mellitus without complication ([place])   [place], Megan P, DO   5 months ago Hyperthyroidism   [place], Megan P, DO   8 months ago Medicare annual wellness visit, subsequent   [employer], Ogles[place], DO   9 months ago Chronic systolic heart failure [facility])   [place], [place], DO   1 year ago Pain of right eye   [place], [place], DO      Future Appointments            In 3  weeks Dunn, Areta Haber, PA-C [facility] [place], [facility]           Signed Prescriptions Disp Refills   furosemide (LASIX) 40 MG tablet 90 tablet 0    Sig: TAKE 1 TABLET (40 MG TOTAL) BY MOUTH DAILY AS NEEDED. FOR SOB/LE EDEMA     Cardiovascular:  Diuretics - Loop Failed - 02/03/2019  9:38 AM      Failed - Cr in normal range and within 360 days    Creatinine, Ser  Date Value Ref Range Status  12/11/2018 0.73 (L) 0.76 - 1.27 mg/dL Final         Failed - Last BP in normal range    BP Readings from Last 1 Encounters:  12/11/18 (!) 149/93         Passed - K in normal range and within 360 days    Potassium  Date Value Ref Range Status  12/11/2018 4.1 3.5 - 5.2 mmol/L Final  Passed - Ca in normal range and within 360 days    Calcium  Date Value Ref Range Status  12/11/2018 8.8 8.6 - 10.2 mg/dL Final         Passed - Na in normal range and within 360 days    Sodium  Date Value Ref Range Status  12/11/2018 137 134 - 144 mmol/L Final         Passed - Valid encounter within last 6 months    Recent Outpatient Visits          1 month ago Diabetes mellitus without complication ([facility])   [facility] [place], Megan P, DO   5 months ago Hyperthyroidism   [employer], Megan P, DO   8 months ago Medicare annual wellness visit, subsequent   [employer], [place], DO   9 months ago Chronic systolic heart failure [facility])   [facility] [place], Megan P, DO   1 year ago Pain of right eye   [facility] Dorcas Carrow, DO      Future Appointments            In 3 weeks Dunn, Raymon Mutton, PA-C [facility], [facility]

## 2019-02-05 NOTE — Telephone Encounter (Signed)
Routing to provider. See message below from [facility].  

## 2019-02-09 ENCOUNTER — Ambulatory Visit: Payer: Self-pay | Admitting: Pharmacist

## 2019-02-09 ENCOUNTER — Telehealth: Payer: Self-pay

## 2019-02-09 DIAGNOSIS — E119 Type 2 diabetes mellitus without complications: Secondary | ICD-10-CM

## 2019-02-09 NOTE — Patient Instructions (Signed)
Visit Information  Goals Addressed            This Visit's Progress     Patient Stated   . PharmD "He has a lot of medications" (pt-stated)       Current Barriers:  . Polypharmacy; complex patient with multiple comorbidities including CHF, Afib, HTN, COPD, T2DM, hyperthyroidism, Hep C; Daughter, [name], helps manage medications and medical conditions o CHF/Afib/HTN/ASCVD risk reduction: follows with Dr. Fletcher Anon; appointment w/ NP Dunn on 02/25/2001; Entresto 24/26 mg BID, metoprolol tartrate 25 mg BID, digoxin 0.125 mg daily. furosemide 40 mg daily, potassium 20 mEq daily; atorvastatin 40 mg daily o COPD: Symbicort 160/4.5 mg 2 puffs BID, albuterol HFA PRN o T2DM: metformin 500 mg daily; [name] requests a glucometer prescription  o Hyperthyroidism: methimazole 20 mg BID o Hep C: Epclusa 400/100 mg daily  Pharmacist Clinical Goal(s):  Marland Kitchen Over the next 90 days, patient will work with PharmD and provider towards optimized medication management  Interventions: . [name] was out and declined a medication review day. Denied any medication related questions or concerns. Notes that patient has follow up with cardiology later this month, and she will address any concerns then . Will alert Dr. Wynetta Emery to the glucometer request. Patient likely does not need to check regularly d/t metformin monotherapy.   Patient Self Care Activities:  . Patient will take medications as prescribed  Initial goal documentation        The patient verbalized understanding of instructions provided today and declined a print copy of patient instruction materials.    Plan: - Will outreach patient/patient's daughter in 3-4 weeks to review any medication changes made at upcoming cardiology appointment.  Catie Darnelle Maffucci, PharmD Clinical Pharmacist [place] [phone]

## 2019-02-09 NOTE — Chronic Care Management (AMB) (Signed)
Chronic Care Management   Note  02/09/2019 Name: Eugene Henderson MRN: [id number] DOB: July 28, 1950   Subjective:  Eugene Henderson is a 68 y.o. year old male who is a primary care patient of Eugene Henderson, Philip, DO. The CCM team was consulted for assistance with chronic disease management and care coordination needs.    Review of patient status, including review of consultants reports, laboratory and other test data, was performed as part of comprehensive evaluation and provision of chronic care management services.   Objective:  Lab Results  Component Value Date   CREATININE 0.73 (L) 12/11/2018   CREATININE 0.88 08/28/2018   CREATININE 0.75 (L) 04/11/2018    Lab Results  Component Value Date   HGBA1C 7.5 (H) 12/11/2018       Component Value Date/Time   CHOL 82 (L) 12/11/2018 1600   TRIG 86 12/11/2018 1600   HDL 53 12/11/2018 1600   LDLCALC 12 12/11/2018 1600    Clinical ASCVD: No   BP Readings from Last 3 Encounters:  12/11/18 (!) 149/93  08/28/18 (!) 144/91  05/08/18 (!) 156/100    No Known Allergies  Medications Reviewed Today    Reviewed by Eugene Carrow, DO (Physician) on 12/11/18 at 1558  Med List Status: <None>  Medication Order Taking? Sig Documenting Provider Last Dose Status Informant  acetaminophen (TYLENOL) 500 MG tablet [id number] Yes Take 500 mg by mouth every 6 (six) hours as needed. [provider] Taking Active Self  albuterol (PROVENTIL HFA;VENTOLIN HFA) 108 (90 Base) MCG/ACT inhaler [id number] Yes Inhale 2 puffs into the lungs every 6 (six) hours as needed for wheezing or shortness of breath. Eugene Henderson A, Eugene Henderson Taking Active   atorvastatin (LIPITOR) 40 MG tablet [id number] Yes TAKE 1 TABLET BY MOUTH EVERY DAY Henderson, Eugene P, DO Taking Active   budesonide-formoterol (SYMBICORT) 160-4.5 MCG/ACT inhaler [id number] Yes Inhale 2 puffs into the lungs 2 (two) times daily. Eugene Freeze, Eugene Henderson Taking Active   Difluprednate (DUREZOL) 0.05 % EMUL  [id number] Yes Place 1 drop into the right eye every 4 (four) hours while awake. [provider] Taking Active Self  digoxin (LANOXIN) 0.125 MG tablet [id number] Yes Take 1 tablet (0.125 mg total) by mouth daily. Eugene, Eugene P, DO Taking Active   ELIQUIS 5 MG TABS tablet [id number] Yes TAKE 1 TABLET BY MOUTH TWICE A DAY Eugene Henderson, Eugene P, DO Taking Active   feeding supplement (BOOST / RESOURCE BREEZE) LIQD [id number] Yes Take 1 Container by mouth 2 (two) times daily between meals. Eugene Lovett, MD Taking Active Self  furosemide (LASIX) 40 MG tablet [id number] Yes TAKE 1 TABLET (40 MG TOTAL) BY MOUTH DAILY AS NEEDED. FOR SOB/LE EDEMA Eugene Henderson, Eugene P, DO Taking Active   gentamicin (GARAMYCIN) 0.3 % ophthalmic solution [id number] Yes Place 1 drop into both eyes every 4 (four) hours. Eugene Reining, PA-C Taking Active Self  methimazole (TAPAZOLE) 10 MG tablet [id number] Yes Take 2 tablets (20 mg total) by mouth 2 (two) times daily. Eugene Henderson P, DO Taking Active Self  metoprolol succinate (TOPROL-XL) 25 MG 24 hr tablet [id number] Yes Take 1 tablet (25 mg total) by mouth daily. Eugene Henderson, Eugene P, DO Taking Active   moxifloxacin (VIGAMOX) 0.5 % ophthalmic solution [id number] Yes Place 1 drop into the right eye every 4 (four) hours while awake.  [provider] Taking Active Self           Med Note Eugene Henderson May 15, 2017  2:11 PM) Patient  reports initially prescribed as 1 DROP RIGHT EYE EVERY HOUR WHILE AWAKE FOR 24 HOURS. Patient had appointment Wednesday the 5th and provider did not indicate any change in use. Patient reports using 1 DROP IN RIGHT EYE EVERY 2-3 HOURS since then. Reports having another appointment in the coming week.   naphazoline-pheniramine (NAPHCON-A) 0.025-0.3 % ophthalmic solution [id number] Yes Place 1 drop into both eyes 4 (four) times daily as needed for eye irritation. Eugene Feil, PA-C Taking Active Self  potassium chloride SA (K-DUR,KLOR-CON) 20 MEQ  tablet [id number] Yes Take 1 tablet (20 mEq total) by mouth daily. Eugene Graff, Eugene Henderson Taking Active   prednisoLONE acetate (PRED FORTE) 1 % ophthalmic suspension [id number] Yes Place 1 drop into the right eye every 2 (two) hours while awake.  [provider] Taking Active   sacubitril-valsartan (ENTRESTO) 24-26 MG [id number] Yes Take 1 tablet by mouth 2 (two) times daily. [provider] Taking Active Self  Sofosbuvir-Velpatasvir (EPCLUSA) 400-100 MG TABS [id number] Yes Take 1 tablet by mouth daily. [provider] Taking Active   spironolactone (ALDACTONE) 25 MG tablet [id number] Yes Take 0.5 tablets (12.5 mg total) by mouth daily. Eugene Henderson, Eugene P, DO Taking Active   valACYclovir (VALTREX) 1000 MG tablet [id number] Yes Take 1 tablet by mouth daily. [provider] Taking Active            Assessment:   Goals Addressed            This Visit's Progress     Patient Stated   . PharmD "He has a lot of medications" (pt-stated)       Current Barriers:  . Polypharmacy; complex patient with multiple comorbidities including CHF, Afib, HTN, COPD, T2DM, hyperthyroidism, Hep C; Daughter, Eugene Henderson, helps manage medications and medical conditions o CHF/Afib/HTN/ASCVD risk reduction: follows with Eugene Henderson; appointment w/ Eugene Henderson on 02/25/2001; Entresto 24/26 mg BID, metoprolol tartrate 25 mg BID, digoxin 0.125 mg daily. furosemide 40 mg daily, potassium 20 mEq daily; atorvastatin 40 mg daily o COPD: Symbicort 160/4.5 mg 2 puffs BID, albuterol HFA PRN o T2DM: metformin 500 mg daily; Eugene Henderson requests a glucometer prescription  o Hyperthyroidism: methimazole 20 mg BID o Hep C: Epclusa 400/100 mg daily  Pharmacist Clinical Goal(s):  Marland Kitchen Over the next 90 days, patient will work with PharmD and provider towards optimized medication management  Interventions: . Eugene Henderson was out and declined a medication review day. Denied any medication related questions or concerns. Notes that  patient has follow up with cardiology later this month, and she will address any concerns then . Will alert Eugene Henderson to the glucometer request. Patient likely does not need to check regularly d/t metformin monotherapy.   Patient Self Care Activities:  . Patient will take medications as prescribed  Initial goal documentation        Plan: - Will outreach patient/patient's daughter in 3-4 weeks to review any medication changes made at upcoming cardiology appointment.  Catie Darnelle Maffucci, PharmD Clinical Pharmacist [place] [phone]

## 2019-02-21 DIAGNOSIS — M19021 Primary osteoarthritis, right elbow: Secondary | ICD-10-CM | POA: Diagnosis not present

## 2019-02-21 DIAGNOSIS — M25521 Pain in right elbow: Secondary | ICD-10-CM | POA: Diagnosis not present

## 2019-02-21 DIAGNOSIS — M7711 Lateral epicondylitis, right elbow: Secondary | ICD-10-CM | POA: Diagnosis not present

## 2019-02-21 DIAGNOSIS — M25421 Effusion, right elbow: Secondary | ICD-10-CM | POA: Diagnosis not present

## 2019-02-23 ENCOUNTER — Other Ambulatory Visit: Payer: Self-pay | Admitting: Family Medicine

## 2019-02-23 NOTE — Progress Notes (Deleted)
Cardiology Office Note    Date:  02/23/2019   ID:  Eugene Henderson, DOB 21-Jun-1950, MRN [id number]  PCP:  Dorcas Carrow, DO  Cardiologist:  Lorine Bears, MD  Electrophysiologist:  None   Chief Complaint: Follow up  History of Present Illness:   Eugene Henderson is a 68 y.o. male with history of nonobstructive CAD by LHC in 07/2016, HFrEF secondary to NICM, persistent Afib on Eliquis, HTN, ascending aortic aneurysm, DM, hyperthyroidism, diverticulitis, and noncompliance who presents for follow up.   He was admitted in 07/2016 with acute systolic CHF in the setting of Afib with RVR. Echo at that time showed an EF of 25-30%, moderate mitral regurgitation, and moderate pulmonary hypertension with a PASP of 50 mmHg. He underwent right and left heart catheterization that showed no significant CAD, severely reduced LV systolic function by echo (LV gram was not performed), right heart catheterization showed a minimally elevated filling pressures and pulmonary hypertension. Moderately reduced cardiac output. RA pressure: 12 mmHg, RV pressure: 40 over 6 mmHg, PA pressure 37/12 with a mean of 12 mmHg. Pulmonary capillary wedge pressure was 12. Cardiac output was 4.16 with a cardiac index of 1.77. 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 He was rate controlled and started on Eliquis. He was also noted to have apneic episodes during the cardiac catheterization with recommendation for outpatient sleep study. He was readmitted in 07/2017 with atypical chest pain with repeat echo showing an EF of 30-35%, diffuse hypokinesis, mild MR, mildly dilated left atrium, normal RVSF, PASP 40 mmHg. He was most recently seen in the office in 09/2017 and was uncertain if he had been compliant with medical therapy, including anticoagulation. His weight had been trending up, which he felt like was in the setting of snacking all day with a sedentary lifestyle. He remained in Afib. It was  recommended he follow up in 3 weeks, with compliance with Eliquis, to discuss possible DCCV. He did not follow up. He was seen in the ED in 02/2018 with a fall, chest pain and SOB with workup being unrevealing.   Most recent CT chest from 09/2017 showed a stable 5.1 cm ascending aortic aneurysm.  ***   Labs:  12/2018 - TC 82, TG 86, HDL 53, LDL 12, BUN 7, SCr 0.73, K+ 4.1, albumin 4.0, AST/ALT normal, A1c 7.5 08/2018 - TSH normal, HGB 14.9, PLT 180  Past Medical History:  Diagnosis Date   Arthritis    Ascending aortic aneurysm (HCC)    a. 09/2017 Stable TAA - 5.1cm.   Asthma    Chronic systolic CHF (congestive heart failure) (HCC)    a. EF 25-30% by echo in 07/2016 with cath showing no significant CAD b. 01/2017: EF 30-35% with diffuse HK and moderate MR; c. 07/2017 Echo: EF 30-35%, diff hK. Mild MR, mildly dil LA. PASP .   Diverticulitis    Diverticulitis of large intestine with perforation without abscess or bleeding 05/13/2017   Hypertension    Hyperthyroidism    NICM (nonischemic cardiomyopathy) (HCC)    Noncompliance    Persistent atrial fibrillation    a. CHA2DS2VASc = 3-->Eliquis (? compliance).    Past Surgical History:  Procedure Laterality Date   JOINT REPLACEMENT Right    RIGHT/LEFT HEART CATH AND CORONARY ANGIOGRAPHY N/A 08/02/2016   Procedure: Right/Left Heart Cath and Coronary Angiography;  Surgeon: Iran Ouch, MD;  Location: [facility] INVASIVE CV LAB;  Service: Cardiovascular;  Laterality: N/A;   TESTICLE SURGERY  Patient states that he had to have the tube fixed.    Current Medications: No outpatient medications have been marked as taking for the 02/26/19 encounter (Appointment) with Rise Mu, PA-C.    Allergies:   Patient has no known allergies.   Social History   Socioeconomic History   Marital status: Single    Spouse name: Not on file   Number of children: Not on file   Years of education: Not on file   Highest education  level: Not on file  Occupational History   Occupation: retired  Scientist, product/process development strain: Not on file   Food insecurity    Worry: Not on file    Inability: Not on Lexicographer needs    Medical: Not on file    Non-medical: Not on file  Tobacco Use   Smoking status: Current Some Day Smoker    Packs/day: 0.25    Years: 25.00    Pack years: 6.25    Types: Cigarettes   Smokeless tobacco: Never Used  Substance and Sexual Activity   Alcohol use: Yes    Comment: Socially - once a month   Drug use: No   Sexual activity: Never  Lifestyle   Physical activity    Days per week: Not on file    Minutes per session: Not on file   Stress: Not on file  Relationships   Social connections    Talks on phone: Not on file    Gets together: Not on file    Attends religious service: Not on file    Active member of club or organization: Not on file    Attends meetings of clubs or organizations: Not on file    Relationship status: Not on file  Other Topics Concern   Not on file  Social History Narrative   Not on file     Family History:  The patient's family history includes Diabetes in his brother, father, and mother; Heart attack in his brother; Heart disease in his brother; Kidney failure in his brother. There is no history of Thyroid disease.  ROS:   ROS   EKGs/Labs/Other Studies Reviewed:    Studies reviewed were summarized above. The additional studies were reviewed today:  2D echo 07/2017: - Left ventricle: The cavity size was normal. There was mild   concentric hypertrophy. Systolic function was moderately to   severely reduced. The estimated ejection fraction was in the   range of 30% to 35%. Diffuse hypokinesis. The study is not   technically sufficient to allow evaluation of LV diastolic   function. - Mitral valve: There was mild regurgitation. - Left atrium: The atrium was mildly dilated. - Right ventricle: Systolic function was  normal. - Right atrium: The atrium was mildly dilated. - Pulmonary arteries: Systolic pressure was mildly elevated. PA   peak pressure: 40 mm Hg (S).  Impressions:  - Rhythm is atrial fibrillation. __________  [facility] 07/17/2016: 1. No significant coronary artery disease. 2. Severely reduced LV systolic function by echo. Left ventricular angiography was not performed.  3. Right heart catheterization showed minimally elevated filling pressures and pulmonary hypertension. Moderately reduced cardiac output. RA pressure: 12 mmHg, RV pressure: 40 over 6 mmHg, PA pressure 37/12 with a mean of 12 mmHg. Pulmonary capillary wedge pressure was 12. Cardiac output was 4.16 with a cardiac index of 1.77.  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 His volume status appears to be close to normal.  Thus, I switched furosemide to 40 mg by mouth twice daily. Ventricular rate is still not optimally controlled and thus I increased Toprol to 200 mg once daily. Start long-term anticoagulation with Eliquis 5 mg twice daily starting tomorrow.  The patient was noted to have apnea episodes during cardiac catheterization. Recommend outpatient evaluation for sleep apnea. Avoid catheterization via the right radial artery in the future due to tortuous innominate artery and significantly dilated aortic root.   EKG:  EKG is ordered today.  The EKG ordered today demonstrates ***  Recent Labs: 08/28/2018: Hemoglobin 14.9; Platelets 180; TSH 1.630 12/11/2018: ALT 13; BUN 7; Creatinine, Ser 0.73; Potassium 4.1; Sodium 137  Recent Lipid Panel    Component Value Date/Time   CHOL 82 (L) 12/11/2018 1600   TRIG 86 12/11/2018 1600   HDL 53 12/11/2018 1600   LDLCALC 12 12/11/2018 1600    PHYSICAL EXAM:    VS:  There were no vitals taken for this visit.  BMI: There is no height or weight on file to calculate BMI.  Physical Exam  Wt Readings from Last 3 Encounters:  12/11/18  280 lb 4 oz (127.1 kg)  05/16/18 259 lb (117.5 kg)  05/08/18 268 lb 3.2 oz (121.7 kg)     ASSESSMENT & PLAN:   1. ***  Disposition: F/u with Dr. Kirke CorinArida or an APP in ***.   Medication Adjustments/Labs and Tests Ordered: Current medicines are reviewed at length with the patient today.  Concerns regarding medicines are outlined above. Medication changes, Labs and Tests ordered today are summarized above and listed in the Patient Instructions accessible in Encounters.   Signed, Eula Listenyan Nur Rabold, PA-C 02/23/2019 8:17 AM     [facility] [address] [place], [place] [address] [phone]

## 2019-02-23 NOTE — Telephone Encounter (Signed)
Forwarding medication refill request to PCP for review. 

## 2019-02-26 ENCOUNTER — Ambulatory Visit: Payer: Medicare HMO | Admitting: Physician Assistant

## 2019-02-28 ENCOUNTER — Telehealth: Payer: Self-pay

## 2019-03-02 ENCOUNTER — Telehealth: Payer: Self-pay

## 2019-03-02 NOTE — Telephone Encounter (Signed)
Left number for meals on wheels. Discussed requirements. Will follow up to make sure resource was useful.

## 2019-03-16 ENCOUNTER — Telehealth: Payer: Self-pay

## 2019-03-20 ENCOUNTER — Ambulatory Visit: Payer: Self-pay | Admitting: Pharmacist

## 2019-03-20 ENCOUNTER — Telehealth: Payer: Self-pay | Admitting: Pharmacist

## 2019-03-20 ENCOUNTER — Telehealth: Payer: Self-pay

## 2019-03-20 NOTE — Telephone Encounter (Signed)
Called to follow up on MOW referral. No answer

## 2019-03-20 NOTE — Chronic Care Management (AMB) (Signed)
  Chronic Care Management   Note  03/20/2019 Name: Eugene Henderson MRN: [id number] DOB: [birth year]  Eugene Henderson is a 68 y.o. year old male who is a primary care patient of Toretto, Tingler, DO. The CCM team was consulted for assistance with chronic disease management and care coordination needs.    Attempted to contact patient's daughter to discuss medication management, and to encourage to reschedule missed cardiology appointment from 02/26/2019. Left HIPAA compliant message for her to return my call at her convenience.   Follow up plan: - If I do not hear back, will outreach again in the next 3-5 weeks for continued medication management  Catie Darnelle Maffucci, PharmD Clinical Pharmacist [place] [phone]

## 2019-04-06 ENCOUNTER — Ambulatory Visit: Payer: Self-pay | Admitting: *Deleted

## 2019-04-06 ENCOUNTER — Telehealth: Payer: Self-pay

## 2019-04-06 DIAGNOSIS — E119 Type 2 diabetes mellitus without complications: Secondary | ICD-10-CM

## 2019-04-06 DIAGNOSIS — I10 Essential (primary) hypertension: Secondary | ICD-10-CM

## 2019-04-06 NOTE — Chronic Care Management (AMB) (Signed)
  Chronic Care Management   Outreach Note  04/06/2019 Name: Eugene Henderson MRN: [id number] DOB: 1951-01-30  Referred by: Valerie Roys, DO Reason for referral : Chronic Care Management (Unsuccessful Outreach )   An unsuccessful telephone outreach was attempted today. The patient was referred to the case management team by for assistance with care management and care coordination.   Follow Up Plan: A HIPPA compliant phone message was left for the patient providing contact information and requesting a return call.  The care management team will reach out to the patient again over the next 30 days.  The patient has been provided with contact information for the care management team and has been advised to call with any health related questions or concerns.   Merlene Morse Lorie Melichar RN, BSN Nurse Case Editor, [facility]ing [facility] Care Management  [phone]) Business Mobile

## 2019-05-10 ENCOUNTER — Telehealth: Payer: Self-pay | Admitting: Family Medicine

## 2019-05-10 NOTE — Telephone Encounter (Signed)
° ° °  Called pt regarding Data processing manager Referral for [employer] and to sign up for MOW, determine other needs [place] [employer] ??[email or web address]   ??[phone]

## 2019-05-11 NOTE — Telephone Encounter (Signed)
° ° °  Spoke with pt's daughter [name], stated pt would call back to discuss  [place] [facility] ??[email or web address]   ??[phone]

## 2019-05-15 ENCOUNTER — Other Ambulatory Visit: Payer: Self-pay

## 2019-05-15 NOTE — Telephone Encounter (Signed)
Lvm to make appointment for medication refill

## 2019-05-15 NOTE — Telephone Encounter (Signed)
Needs appointment

## 2019-05-15 NOTE — Telephone Encounter (Signed)
Patient last seen 12/11/18

## 2019-05-16 ENCOUNTER — Encounter: Payer: Self-pay | Admitting: Family Medicine

## 2019-05-16 NOTE — Telephone Encounter (Signed)
Sending letter for follow up.

## 2019-05-17 NOTE — Telephone Encounter (Signed)
° ° °  3rd attempt spoke with sister she asked that I call back in afternoon   [place] [employer] ??[email or web address]   ??[phone]

## 2019-05-18 ENCOUNTER — Telehealth: Payer: Self-pay

## 2019-05-18 NOTE — Telephone Encounter (Signed)
4th attempt, lmtcb   Closing referral pending any other needs of patient.  [place] . Embedded Care Coordination [place]  Care Management ??[email or web address]  ??[phone]

## 2019-05-23 ENCOUNTER — Other Ambulatory Visit: Payer: Self-pay | Admitting: Family Medicine

## 2019-05-24 ENCOUNTER — Telehealth: Payer: Self-pay | Admitting: Family Medicine

## 2019-05-24 NOTE — Telephone Encounter (Signed)
Pt's daughter wendi presented in office stating pt needs refills, pt scheduled for 1/14 wants enough to last until appt. Please advise.

## 2019-05-25 ENCOUNTER — Other Ambulatory Visit: Payer: Self-pay | Admitting: Family Medicine

## 2019-05-25 MED ORDER — METOPROLOL SUCCINATE ER 25 MG PO TB24: 25.0000 mg | ORAL_TABLET | Freq: Every day | ORAL | 0 refills | Status: DC

## 2019-05-25 MED ORDER — FUROSEMIDE 40 MG PO TABS: 40.0000 mg | ORAL_TABLET | Freq: Every day | ORAL | 0 refills | Status: DC

## 2019-05-25 MED ORDER — DIGOXIN 125 MCG PO TABS: 125.0000 ug | ORAL_TABLET | Freq: Every day | ORAL | 0 refills | Status: DC

## 2019-05-25 MED ORDER — SPIRONOLACTONE 25 MG PO TABS: 25.0000 mg | ORAL_TABLET | Freq: Every day | ORAL | 0 refills | Status: DC

## 2019-05-25 MED ORDER — POTASSIUM CHLORIDE CRYS ER 20 MEQ PO TBCR: 20.0000 meq | EXTENDED_RELEASE_TABLET | Freq: Every day | ORAL | 0 refills | Status: DC

## 2019-05-25 MED ORDER — BUDESONIDE-FORMOTEROL FUMARATE 160-4.5 MCG/ACT IN AERO: 2.0000 | INHALATION_SPRAY | Freq: Two times a day (BID) | RESPIRATORY_TRACT | 0 refills | Status: DC

## 2019-05-25 MED ORDER — ATORVASTATIN CALCIUM 40 MG PO TABS: 40.0000 mg | ORAL_TABLET | Freq: Every day | ORAL | 0 refills | Status: DC

## 2019-05-25 MED ORDER — APIXABAN 5 MG PO TABS: 5.0000 mg | ORAL_TABLET | Freq: Two times a day (BID) | ORAL | 0 refills | Status: DC

## 2019-05-25 MED ORDER — METFORMIN HCL ER 500 MG PO TB24: 500.0000 mg | ORAL_TABLET | Freq: Every day | ORAL | 0 refills | Status: DC

## 2019-05-25 MED ORDER — METHIMAZOLE 10 MG PO TABS: 20.0000 mg | ORAL_TABLET | Freq: Two times a day (BID) | ORAL | 0 refills | Status: DC

## 2019-05-25 NOTE — Telephone Encounter (Signed)
Forwarding medication refill requests to PCP for review. 

## 2019-05-26 ENCOUNTER — Other Ambulatory Visit: Payer: Self-pay | Admitting: Family Medicine

## 2019-06-06 ENCOUNTER — Telehealth: Payer: Self-pay

## 2019-06-08 ENCOUNTER — Other Ambulatory Visit: Payer: Self-pay | Admitting: Family Medicine

## 2019-06-08 NOTE — Telephone Encounter (Signed)
Forwarding medication refill request to the PCP for review. 

## 2019-06-18 ENCOUNTER — Ambulatory Visit: Payer: Medicare HMO

## 2019-06-21 ENCOUNTER — Other Ambulatory Visit: Payer: Self-pay

## 2019-06-21 ENCOUNTER — Encounter: Payer: Self-pay | Admitting: Family Medicine

## 2019-06-21 ENCOUNTER — Ambulatory Visit (INDEPENDENT_AMBULATORY_CARE_PROVIDER_SITE_OTHER): Payer: Medicare HMO

## 2019-06-21 ENCOUNTER — Ambulatory Visit (INDEPENDENT_AMBULATORY_CARE_PROVIDER_SITE_OTHER): Payer: Medicare HMO | Admitting: Family Medicine

## 2019-06-21 VITALS — BP 161/101 | HR 89

## 2019-06-21 DIAGNOSIS — I4819 Other persistent atrial fibrillation: Secondary | ICD-10-CM

## 2019-06-21 DIAGNOSIS — I5022 Chronic systolic (congestive) heart failure: Secondary | ICD-10-CM | POA: Diagnosis not present

## 2019-06-21 DIAGNOSIS — I10 Essential (primary) hypertension: Secondary | ICD-10-CM

## 2019-06-21 DIAGNOSIS — I714 Abdominal aortic aneurysm, without rupture, unspecified: Secondary | ICD-10-CM

## 2019-06-21 DIAGNOSIS — Z0000 Encounter for general adult medical examination without abnormal findings: Secondary | ICD-10-CM

## 2019-06-21 DIAGNOSIS — Z125 Encounter for screening for malignant neoplasm of prostate: Secondary | ICD-10-CM | POA: Diagnosis not present

## 2019-06-21 DIAGNOSIS — E119 Type 2 diabetes mellitus without complications: Secondary | ICD-10-CM

## 2019-06-21 DIAGNOSIS — J449 Chronic obstructive pulmonary disease, unspecified: Secondary | ICD-10-CM | POA: Diagnosis not present

## 2019-06-21 DIAGNOSIS — E0590 Thyrotoxicosis, unspecified without thyrotoxic crisis or storm: Secondary | ICD-10-CM

## 2019-06-21 DIAGNOSIS — B182 Chronic viral hepatitis C: Secondary | ICD-10-CM | POA: Diagnosis not present

## 2019-06-21 DIAGNOSIS — J42 Unspecified chronic bronchitis: Secondary | ICD-10-CM | POA: Diagnosis not present

## 2019-06-21 MED ORDER — METHIMAZOLE 10 MG PO TABS: 20.0000 mg | ORAL_TABLET | Freq: Two times a day (BID) | ORAL | 0 refills | Status: DC

## 2019-06-21 MED ORDER — ATORVASTATIN CALCIUM 40 MG PO TABS: 40.0000 mg | ORAL_TABLET | Freq: Every day | ORAL | 0 refills | Status: DC

## 2019-06-21 MED ORDER — APIXABAN 5 MG PO TABS: 5.0000 mg | ORAL_TABLET | Freq: Two times a day (BID) | ORAL | 0 refills | Status: DC

## 2019-06-21 MED ORDER — METOPROLOL SUCCINATE ER 25 MG PO TB24: 25.0000 mg | ORAL_TABLET | Freq: Every day | ORAL | 0 refills | Status: DC

## 2019-06-21 MED ORDER — SPIRONOLACTONE 25 MG PO TABS: 25.0000 mg | ORAL_TABLET | Freq: Every day | ORAL | 0 refills | Status: DC

## 2019-06-21 MED ORDER — FUROSEMIDE 40 MG PO TABS: 40.0000 mg | ORAL_TABLET | Freq: Every day | ORAL | 0 refills | Status: DC

## 2019-06-21 MED ORDER — POTASSIUM CHLORIDE CRYS ER 20 MEQ PO TBCR: 20.0000 meq | EXTENDED_RELEASE_TABLET | Freq: Every day | ORAL | 0 refills | Status: DC

## 2019-06-21 MED ORDER — METFORMIN HCL ER 500 MG PO TB24: 500.0000 mg | ORAL_TABLET | Freq: Every day | ORAL | 0 refills | Status: DC

## 2019-06-21 MED ORDER — BUDESONIDE-FORMOTEROL FUMARATE 160-4.5 MCG/ACT IN AERO: 2.0000 | INHALATION_SPRAY | Freq: Two times a day (BID) | RESPIRATORY_TRACT | 0 refills | Status: DC

## 2019-06-21 NOTE — Patient Instructions (Signed)
Eugene Henderson , Thank you for taking time to come for your Medicare Wellness Visit. I appreciate your ongoing commitment to your health goals. Please review the following plan we discussed and let me know if I can assist you in the future.   Screening recommendations/referrals: Colonoscopy: declined  Recommended yearly ophthalmology/optometry visit for glaucoma screening and checkup Recommended yearly dental visit for hygiene and checkup  Vaccinations: Influenza vaccine: due now  Pneumococcal vaccine: up to date  Tdap vaccine: up to date  Shingles vaccine: shingrix eligible     Advanced directives: Please bring a copy of your health care power of attorney and living will to the office at your convenience.  Conditions/risks identified: diabetic   Next appointment: Follow up in one year for your annual wellness visit   Preventive Care 65 Years and Older, Male Preventive care refers to lifestyle choices and visits with your health care provider that can promote health and wellness. What does preventive care include?  A yearly physical exam. This is also called an annual well check.  Dental exams once or twice a year.  Routine eye exams. Ask your health care provider how often you should have your eyes checked.  Personal lifestyle choices, including:  Daily care of your teeth and gums.  Regular physical activity.  Eating a healthy diet.  Avoiding tobacco and drug use.  Limiting alcohol use.  Practicing safe sex.  Taking low doses of aspirin every day.  Taking vitamin and mineral supplements as recommended by your health care provider. What happens during an annual well check? The services and screenings done by your health care provider during your annual well check will depend on your age, overall health, lifestyle risk factors, and family history of disease. Counseling  Your health care provider may ask you questions about your:  Alcohol use.  Tobacco use.  Drug  use.  Emotional well-being.  Home and relationship well-being.  Sexual activity.  Eating habits.  History of falls.  Memory and ability to understand (cognition).  Work and work Astronomer. Screening  You may have the following tests or measurements:  Height, weight, and BMI.  Blood pressure.  Lipid and cholesterol levels. These may be checked every 5 years, or more frequently if you are over 37 years old.  Skin check.  Lung cancer screening. You may have this screening every year starting at age 55 if you have a 30-pack-year history of smoking and currently smoke or have quit within the past 15 years.  Fecal occult blood test (FOBT) of the stool. You may have this test every year starting at age 82.  Flexible sigmoidoscopy or colonoscopy. You may have a sigmoidoscopy every 5 years or a colonoscopy every 10 years starting at age 72.  Prostate cancer screening. Recommendations will vary depending on your family history and other risks.  Hepatitis C blood test.  Hepatitis B blood test.  Sexually transmitted disease (STD) testing.  Diabetes screening. This is done by checking your blood sugar (glucose) after you have not eaten for a while (fasting). You may have this done every 1-3 years.  Abdominal aortic aneurysm (AAA) screening. You may need this if you are a current or former smoker.  Osteoporosis. You may be screened starting at age 63 if you are at high risk. Talk with your health care provider about your test results, treatment options, and if necessary, the need for more tests. Vaccines  Your health care provider may recommend certain vaccines, such as:  Influenza vaccine.  This is recommended every year.  Tetanus, diphtheria, and acellular pertussis (Tdap, Td) vaccine. You may need a Td booster every 10 years.  Zoster vaccine. You may need this after age 67.  Pneumococcal 13-valent conjugate (PCV13) vaccine. One dose is recommended after age  71.  Pneumococcal polysaccharide (PPSV23) vaccine. One dose is recommended after age 86. Talk to your health care provider about which screenings and vaccines you need and how often you need them. This information is not intended to replace advice given to you by your health care provider. Make sure you discuss any questions you have with your health care provider. Document Released: 06/20/2015 Document Revised: 02/11/2016 Document Reviewed: 03/25/2015 Elsevier Interactive Patient Education  2017 [place] Prevention in the Home Falls can cause injuries. They can happen to people of all ages. There are many things you can do to make your home safe and to help prevent falls. What can I do on the outside of my home?  Regularly fix the edges of walkways and driveways and fix any cracks.  Remove anything that might make you trip as you walk through a door, such as a raised step or threshold.  Trim any bushes or trees on the path to your home.  Use bright outdoor lighting.  Clear any walking paths of anything that might make someone trip, such as rocks or tools.  Regularly check to see if handrails are loose or broken. Make sure that both sides of any steps have handrails.  Any raised decks and porches should have guardrails on the edges.  Have any leaves, snow, or ice cleared regularly.  Use sand or salt on walking paths during winter.  Clean up any spills in your garage right away. This includes oil or grease spills. What can I do in the bathroom?  Use night lights.  Install grab bars by the toilet and in the tub and shower. Do not use towel bars as grab bars.  Use non-skid mats or decals in the tub or shower.  If you need to sit down in the shower, use a plastic, non-slip stool.  Keep the floor dry. Clean up any water that spills on the floor as soon as it happens.  Remove soap buildup in the tub or shower regularly.  Attach bath mats securely with double-sided  non-slip rug tape.  Do not have throw rugs and other things on the floor that can make you trip. What can I do in the bedroom?  Use night lights.  Make sure that you have a light by your bed that is easy to reach.  Do not use any sheets or blankets that are too big for your bed. They should not hang down onto the floor.  Have a firm chair that has side arms. You can use this for support while you get dressed.  Do not have throw rugs and other things on the floor that can make you trip. What can I do in the kitchen?  Clean up any spills right away.  Avoid walking on wet floors.  Keep items that you use a lot in easy-to-reach places.  If you need to reach something above you, use a strong step stool that has a grab bar.  Keep electrical cords out of the way.  Do not use floor polish or wax that makes floors slippery. If you must use wax, use non-skid floor wax.  Do not have throw rugs and other things on the floor that can make  you trip. What can I do with my stairs?  Do not leave any items on the stairs.  Make sure that there are handrails on both sides of the stairs and use them. Fix handrails that are broken or loose. Make sure that handrails are as long as the stairways.  Check any carpeting to make sure that it is firmly attached to the stairs. Fix any carpet that is loose or worn.  Avoid having throw rugs at the top or bottom of the stairs. If you do have throw rugs, attach them to the floor with carpet tape.  Make sure that you have a light switch at the top of the stairs and the bottom of the stairs. If you do not have them, ask someone to add them for you. What else can I do to help prevent falls?  Wear shoes that:  Do not have high heels.  Have rubber bottoms.  Are comfortable and fit you well.  Are closed at the toe. Do not wear sandals.  If you use a stepladder:  Make sure that it is fully opened. Do not climb a closed stepladder.  Make sure that both  sides of the stepladder are locked into place.  Ask someone to hold it for you, if possible.  Clearly mark and make sure that you can see:  Any grab bars or handrails.  First and last steps.  Where the edge of each step is.  Use tools that help you move around (mobility aids) if they are needed. These include:  Canes.  Walkers.  Scooters.  Crutches.  Turn on the lights when you go into a dark area. Replace any light bulbs as soon as they burn out.  Set up your furniture so you have a clear path. Avoid moving your furniture around.  If any of your floors are uneven, fix them.  If there are any pets around you, be aware of where they are.  Review your medicines with your doctor. Some medicines can make you feel dizzy. This can increase your chance of falling. Ask your doctor what other things that you can do to help prevent falls. This information is not intended to replace advice given to you by your health care provider. Make sure you discuss any questions you have with your health care provider. Document Released: 03/20/2009 Document Revised: 10/30/2015 Document Reviewed: 06/28/2014 Elsevier Interactive Patient Education  2017 [employer].

## 2019-06-21 NOTE — Progress Notes (Signed)
BP (!) 161/101   Pulse 89    Subjective:    Patient ID: Eugene Henderson, male    DOB: 05-Aug-1950, 69 y.o.   MRN: [id number]  HPI: Eugene Henderson is a 69 y.o. male  Chief Complaint  Patient presents with  . Diabetes  . Hypertension  . Hyperlipidemia   DIABETES Hypoglycemic episodes:no Polydipsia/polyuria: no Visual disturbance: no Chest pain: no Paresthesias: no Glucose Monitoring: no  Accucheck frequency: Not Checking Taking Insulin?: no Blood Pressure Monitoring: not checking Retinal Examination: Not up to Date Foot Exam: Not up to Date Diabetic Education: Completed Pneumovax: Up to Date Influenza: Up to Date Aspirin: no  HYPERTENSION / HYPERLIPIDEMIA Satisfied with current treatment? no Duration of hypertension: chronic BP monitoring frequency: not checking BP medication side effects: no Past BP meds: digoxin, lasix, metoprolol, entresto, spironalactone Duration of hyperlipidemia: chronic Cholesterol medication side effects: no Cholesterol supplements: none Past cholesterol medications: atorvastatin Medication compliance: fair compliance Aspirin: no Recent stressors: no Recurrent headaches: no Visual changes: no Palpitations: no Dyspnea: no Chest pain: no Lower extremity edema: no Dizzy/lightheaded: no  Relevant past medical, surgical, family and social history reviewed and updated as indicated. Interim medical history since our last visit reviewed. Allergies and medications reviewed and updated.  Review of Systems  Constitutional: Negative.   Respiratory: Negative.   Cardiovascular: Negative.   Gastrointestinal: Negative.   Musculoskeletal: Negative.   Psychiatric/Behavioral: Negative.     Per HPI unless specifically indicated above     Objective:    BP (!) 161/101   Pulse 89   Wt Readings from Last 3 Encounters:  12/11/18 280 lb 4 oz (127.1 kg)  05/16/18 259 lb (117.5 kg)  05/08/18 268 lb 3.2 oz (121.7 kg)    Physical Exam Vitals and  nursing note reviewed.  Constitutional:      General: He is not in acute distress.    Appearance: Normal appearance. He is not ill-appearing, toxic-appearing or diaphoretic.  HENT:     Head: Normocephalic and atraumatic.     Right Ear: External ear normal.     Left Ear: External ear normal.     Nose: Nose normal.     Mouth/Throat:     Mouth: Mucous membranes are moist.     Pharynx: Oropharynx is clear.  Eyes:     General: No scleral icterus.       Right eye: No discharge.        Left eye: No discharge.     Conjunctiva/sclera: Conjunctivae normal.     Pupils: Pupils are equal, round, and reactive to light.  Pulmonary:     Effort: Pulmonary effort is normal. No respiratory distress.     Comments: Speaking in full sentences Musculoskeletal:        General: Normal range of motion.     Cervical back: Normal range of motion.  Skin:    Coloration: Skin is not jaundiced or pale.     Findings: No bruising, erythema, lesion or rash.  Neurological:     Mental Status: He is alert and oriented to person, place, and time. Mental status is at baseline.  Psychiatric:        Mood and Affect: Mood normal.        Behavior: Behavior normal.        Thought Content: Thought content normal.        Judgment: Judgment normal.     Results for orders placed or performed in visit on 12/11/18  Bayer DCA Hb A1c  Waived  Result Value Ref Range   HB A1C (BAYER DCA - WAIVED) 7.5 (H) <7.0 %  Comprehensive metabolic panel  Result Value Ref Range   Glucose 130 (H) 65 - 99 mg/dL   BUN 7 (L) 8 - 27 mg/dL   Creatinine, Ser 0.73 (L) 0.76 - 1.27 mg/dL   GFR calc non Af Amer 96 >59 mL/min/1.73   GFR calc Af Amer 111 >59 mL/min/1.73   BUN/Creatinine Ratio 10 10 - 24   Sodium 137 134 - 144 mmol/L   Potassium 4.1 3.5 - 5.2 mmol/L   Chloride 100 96 - 106 mmol/L   CO2 23 20 - 29 mmol/L   Calcium 8.8 8.6 - 10.2 mg/dL   Total Protein 7.5 6.0 - 8.5 g/dL   Albumin 4.0 3.8 - 4.8 g/dL   Globulin, Total 3.5 1.5 -  4.5 g/dL   Albumin/Globulin Ratio 1.1 (L) 1.2 - 2.2   Bilirubin Total 0.8 0.0 - 1.2 mg/dL   Alkaline Phosphatase 100 39 - 117 IU/L   AST 15 0 - 40 IU/L   ALT 13 0 - 44 IU/L  Lipid Panel w/o Chol/HDL Ratio  Result Value Ref Range   Cholesterol, Total 82 (L) 100 - 199 mg/dL   Triglycerides 86 0 - 149 mg/dL   HDL 53 >39 mg/dL   VLDL Cholesterol Cal 17 5 - 40 mg/dL   LDL Calculated 12 0 - 99 mg/dL  Microalbumin, Urine Waived  Result Value Ref Range   Microalb, Ur Waived 30 (H) 0 - 19 mg/L   Creatinine, Urine Waived 200 10 - 300 mg/dL   Microalb/Creat Ratio <30 <30 mg/g      Assessment & Plan:   Problem List Items Addressed This Visit      Cardiovascular and Mediastinum   Chronic systolic heart failure (HCC) (Chronic)    Will continue to follow with cardiology. Continue to monitor. Call with any concerns.       Relevant Medications   atorvastatin (LIPITOR) 40 MG tablet   furosemide (LASIX) 40 MG tablet   metoprolol succinate (TOPROL-XL) 25 MG 24 hr tablet   spironolactone (ALDACTONE) 25 MG tablet   apixaban (ELIQUIS) 5 MG TABS tablet   Persistent atrial fibrillation (HCC)    Will continue to follow with cardiology. Continue to monitor. Call with any concerns.       Relevant Medications   atorvastatin (LIPITOR) 40 MG tablet   furosemide (LASIX) 40 MG tablet   metoprolol succinate (TOPROL-XL) 25 MG 24 hr tablet   spironolactone (ALDACTONE) 25 MG tablet   apixaban (ELIQUIS) 5 MG TABS tablet   Essential hypertension - Primary    Not under good control. Needs to get in with cardiology again- unclear if he's taking his entresto. Will recheck 2 weeks.       Relevant Medications   atorvastatin (LIPITOR) 40 MG tablet   furosemide (LASIX) 40 MG tablet   metoprolol succinate (TOPROL-XL) 25 MG 24 hr tablet   spironolactone (ALDACTONE) 25 MG tablet   apixaban (ELIQUIS) 5 MG TABS tablet   Other Relevant Orders   CBC with Differential OUT   Comp Met (CMET)   Microalbumin, Urine  Waived   AAA (abdominal aortic aneurysm) ([place])    Will get him rescheduled for follow up. Has not kept appointments. Will continue to follow with cardiology.       Relevant Medications   atorvastatin (LIPITOR) 40 MG tablet   furosemide (LASIX) 40 MG tablet   metoprolol succinate (TOPROL-XL)  25 MG 24 hr tablet   spironolactone (ALDACTONE) 25 MG tablet   apixaban (ELIQUIS) 5 MG TABS tablet     Respiratory   COPD (chronic obstructive pulmonary disease) (HCC)    Under good control on current regimen. Continue current regimen. Continue to monitor. Call with any concerns. Refills given. Labs to be drawn ASAP       Relevant Medications   budesonide-formoterol (SYMBICORT) 160-4.5 MCG/ACT inhaler     Digestive   Chronic hepatitis C without hepatic coma ([place])    S/p treatment. Continue to monitor. Call with any concerns.         Endocrine   Hyperthyroidism    Due for recheck on his labs. Will get him in ASAP. Await results. Treat as needed.       Relevant Medications   methimazole (TAPAZOLE) 10 MG tablet   metoprolol succinate (TOPROL-XL) 25 MG 24 hr tablet   Other Relevant Orders   CBC with Differential OUT   Comp Met (CMET)   TSH   Diabetes mellitus without complication ([place])    Due for recheck on his labs. Will get him in ASAP. Await results. Treat as needed.       Relevant Medications   atorvastatin (LIPITOR) 40 MG tablet   metFORMIN (GLUCOPHAGE XR) 500 MG 24 hr tablet   Other Relevant Orders   Bayer DCA Hb A1c Waived   CBC with Differential OUT   Comp Met (CMET)   Lipid Panel w/o Chol/HDL Ratio OUT   Microalbumin, Urine Waived   UA/M w/rflx Culture, Routine    Other Visit Diagnoses    Screening for prostate cancer       Will check labs. Await results. Treat as needed.    Relevant Orders   PSA       Follow up plan: Return 2 weeks, for Physical/BP follow up.    . This visit was completed via Doximity due to the restrictions of the COVID-19 pandemic. All  issues as above were discussed and addressed. Physical exam was done as above through visual confirmation on Doximity. If it was felt that the patient should be evaluated in the office, they were directed there. The patient verbally consented to this visit. . Location of the patient: home . Location of the provider: home . Those involved with this call:  . Provider: Park Liter, DO . CMA: Tiffany Reel, CMA . Front Desk/Registration: Don Perking  . Time spent on call: 25 minutes with patient face to face via video conference. More than 50% of this time was spent in counseling and coordination of care. 40 minutes total spent in review of patient's record and preparation of their chart.

## 2019-06-21 NOTE — Progress Notes (Signed)
Subjective:   Eugene Henderson is a 69 y.o. male who presents for Medicare Annual/Subsequent preventive examination.  This visit is being conducted via phone call  - after an attmept to do on video chat - due to the COVID-19 pandemic. This patient has given me verbal consent via phone to conduct this visit, patient states they are participating from their home address. Some vital signs may be absent or patient reported.   Patient identification: identified by name, DOB, and current address.    Review of Systems:   Cardiac Risk Factors include: advanced age (>74men, >30 women);male gender;dyslipidemia;hypertension;diabetes mellitus     Objective:    Vitals: There were no vitals taken for this visit.  There is no height or weight on file to calculate BMI.  Advanced Directives 06/21/2019 06/21/2019 03/13/2018 03/02/2018 09/08/2017 07/14/2017 07/14/2017  Does Patient Have a Medical Advance Directive? Yes Yes No No No Yes Yes  Type of Advance Directive Living will;Healthcare Power of Attorney - - - - Midwife -  Does patient want to make changes to medical advance directive? - - - - - No - Patient declined -  Copy of Healthcare Power of Attorney in Chart? No - copy requested - - - - No - copy requested -  Would patient like information on creating a medical advance directive? - - - - No - Patient declined - -    Tobacco Social History   Tobacco Use  Smoking Status Current Some Day Smoker  . Packs/day: 0.25  . Years: 25.00  . Pack years: 6.25  . Types: Cigarettes  Smokeless Tobacco Never Used  Tobacco Comment   pipe tobacco      Ready to quit: No Counseling given: No Comment: pipe tobacco    Clinical Intake:                       Past Medical History:  Diagnosis Date  . Arthritis   . Ascending aortic aneurysm (HCC)    a. 09/2017 Stable TAA - 5.1cm.  . Asthma   . Chronic systolic CHF (congestive heart failure) (HCC)    a. EF 25-30% by echo in  07/2016 with cath showing no significant CAD b. 01/2017: EF 30-35% with diffuse HK and moderate MR; c. 07/2017 Echo: EF 30-35%, diff hK. Mild MR, mildly dil LA. PASP .  . Diverticulitis   . Diverticulitis of large intestine with perforation without abscess or bleeding 05/13/2017  . Hypertension   . Hyperthyroidism   . NICM (nonischemic cardiomyopathy) (HCC)   . Noncompliance   . Persistent atrial fibrillation (HCC)    a. CHA2DS2VASc = 3-->Eliquis (? compliance).   Past Surgical History:  Procedure Laterality Date  . JOINT REPLACEMENT Right   . RIGHT/LEFT HEART CATH AND CORONARY ANGIOGRAPHY N/A 08/02/2016   Procedure: Right/Left Heart Cath and Coronary Angiography;  Surgeon: Iran Ouch, MD;  Location: [facility] INVASIVE CV LAB;  Service: Cardiovascular;  Laterality: N/A;  . TESTICLE SURGERY     Patient states that he had to have the tube fixed.   Family History  Problem Relation Age of Onset  . Diabetes Mother   . Diabetes Father   . Heart disease Brother   . Heart attack Brother   . Diabetes Brother   . Kidney failure Brother   . Thyroid disease Neg Hx    Social History   Socioeconomic History  . Marital status: Single    Spouse name: Not on file  .  Number of children: Not on file  . Years of education: Not on file  . Highest education level: Not on file  Occupational History  . Occupation: retired  Tobacco Use  . Smoking status: Current Some Day Smoker    Packs/day: 0.25    Years: 25.00    Pack years: 6.25    Types: Cigarettes  . Smokeless tobacco: Never Used  . Tobacco comment: pipe tobacco   Substance and Sexual Activity  . Alcohol use: Yes    Comment: Socially - once a month  . Drug use: No  . Sexual activity: Never  Other Topics Concern  . Not on file  Social History Narrative  . Not on file   Social Determinants of Health   Financial Resource Strain:   . Difficulty of Paying Living Expenses: Not on file  Food Insecurity:   . Worried About Community education officer in the Last Year: Not on file  . Ran Out of Food in the Last Year: Not on file  Transportation Needs:   . Lack of Transportation (Medical): Not on file  . Lack of Transportation (Non-Medical): Not on file  Physical Activity:   . Days of Exercise per Week: Not on file  . Minutes of Exercise per Session: Not on file  Stress:   . Feeling of Stress : Not on file  Social Connections:   . Frequency of Communication with Friends and Family: Not on file  . Frequency of Social Gatherings with Friends and Family: Not on file  . Attends Religious Services: Not on file  . Active Member of Clubs or Organizations: Not on file  . Attends Banker Meetings: Not on file  . Marital Status: Not on file    Outpatient Encounter Medications as of 06/21/2019  Medication Sig  . acetaminophen (TYLENOL) 500 MG tablet Take 500 mg by mouth every 6 (six) hours as needed.  Marland Kitchen albuterol (PROVENTIL HFA;VENTOLIN HFA) 108 (90 Base) MCG/ACT inhaler Inhale 2 puffs into the lungs every 6 (six) hours as needed for wheezing or shortness of breath.  Marland Kitchen apixaban (ELIQUIS) 5 MG TABS tablet Take 1 tablet (5 mg total) by mouth 2 (two) times daily.  Marland Kitchen atorvastatin (LIPITOR) 40 MG tablet Take 1 tablet (40 mg total) by mouth daily.  . budesonide-formoterol (SYMBICORT) 160-4.5 MCG/ACT inhaler Inhale 2 puffs into the lungs 2 (two) times daily.  . Difluprednate (DUREZOL) 0.05 % EMUL Place 1 drop into the right eye every 4 (four) hours while awake.  . digoxin (LANOXIN) 0.125 MG tablet TAKE 1 TABLET BY MOUTH DAILY  . furosemide (LASIX) 40 MG tablet Take 1 tablet (40 mg total) by mouth daily.  Marland Kitchen gentamicin (GARAMYCIN) 0.3 % ophthalmic solution Place 1 drop into both eyes every 4 (four) hours.  . metFORMIN (GLUCOPHAGE XR) 500 MG 24 hr tablet Take 1 tablet (500 mg total) by mouth daily with breakfast.  . methimazole (TAPAZOLE) 10 MG tablet Take 2 tablets (20 mg total) by mouth 2 (two) times daily.  . metoprolol  succinate (TOPROL-XL) 25 MG 24 hr tablet Take 1 tablet (25 mg total) by mouth daily.  Marland Kitchen moxifloxacin (VIGAMOX) 0.5 % ophthalmic solution Place 1 drop into the right eye every 4 (four) hours while awake.   . naphazoline-pheniramine (NAPHCON-A) 0.025-0.3 % ophthalmic solution Place 1 drop into both eyes 4 (four) times daily as needed for eye irritation.  . potassium chloride SA (KLOR-CON) 20 MEQ tablet Take 1 tablet (20 mEq total) by mouth  daily.  . prednisoLONE acetate (PRED FORTE) 1 % ophthalmic suspension Place 1 drop into the right eye every 2 (two) hours while awake.   . sacubitril-valsartan (ENTRESTO) 24-26 MG Take 1 tablet by mouth 2 (two) times daily.  . Sofosbuvir-Velpatasvir (EPCLUSA) 400-100 MG TABS Take 1 tablet by mouth daily.  Marland Kitchen spironolactone (ALDACTONE) 25 MG tablet Take 1 tablet (25 mg total) by mouth daily.  . valACYclovir (VALTREX) 1000 MG tablet Take 1 tablet by mouth daily.   No facility-administered encounter medications on file as of 06/21/2019.    Activities of Daily Living In your present state of health, do you have any difficulty performing the following activities: 06/21/2019 06/21/2019  Hearing? Y N  Comment no hearing aids -  Vision? Y Y  Comment cant see out of right eye, reading glasses -  Difficulty concentrating or making decisions? Tempie Donning  Walking or climbing stairs? Y Y  Dressing or bathing? Y N  Comment some, but does most on his own -  Doing errands, shopping? N Y  Comment daughter helps -  Conservation officer, nature and eating ? N -  Using the Toilet? Y -  In the past six months, have you accidently leaked urine? Y -  Do you have problems with loss of bowel control? N -  Managing your Medications? Y -  Comment daughter helps -  Managing your Finances? Y -  Comment daughter does -  Runner, broadcasting/film/video? Y -  Comment daughter does -  Some recent data might be hidden    Patient Care Team: Valerie Roys, DO as PCP - General (Family  Medicine) Wellington Hampshire, MD as PCP - Cardiology (Cardiology) [name], FNP as Nurse Practitioner (Family Medicine) Wellington Hampshire, MD as Consulting Physician (Cardiology) Minor, Dalbert Garnet, RN as Case Manager   Assessment:   This is a routine wellness examination for Eugene Henderson.  Exercise Activities and Dietary recommendations Current Exercise Habits: The patient does not participate in regular exercise at present, Exercise limited by: None identified  Goals    . Diabetes management (pt-stated)     Current Barriers:  Marland Kitchen Knowledge Deficits related to basic Diabetes pathophysiology and self care/management . Financial Constraints  Case Manager Clinical Goal(s):  Over the next 90 days, patient will demonstrate improved adherence to prescribed treatment plan for diabetes self care/management as evidenced by:  . adherence to ADA/ carb modified diet  Interventions:  . Provided education to patient about basic DM disease process . Discussed plans with patient for ongoing care management follow up and provided patient with direct contact information for care management team  . Daughter Eugene Henderson stated patient has stopped putting sugar in coffee and trying to do better with his diet. . Patient not using a glucometer at this time related to he was sharing with daughter and theirs broke . Collaborated with PCP to request a script for new meter. . Daughter states patient could really benefit from meals on wheels related to financial strain. She is the care giver and has her own health issues . C-3 referral for Meals on Wheels  . Pharmacy referral, patient with no glucometer and polypharmacy   Patient Self Care Activities:  . UNABLE to independently adhere to ADA/Carb modified diet  Please see past updates related to this goal by clicking on the "Past Updates" button in the selected goal       . PharmD "He has a lot of medications" (pt-stated)  Current Barriers:  . Polypharmacy;  complex patient with multiple comorbidities including CHF, Afib, HTN, COPD, T2DM, hyperthyroidism, Hep C; Daughter, Eugene Henderson, helps manage medications and medical conditions o CHF/Afib/HTN/ASCVD risk reduction: follows with Dr. Kirke Corin; appointment w/ NP Dunn on 02/25/2001; Entresto 24/26 mg BID, metoprolol tartrate 25 mg BID, digoxin 0.125 mg daily. furosemide 40 mg daily, potassium 20 mEq daily; atorvastatin 40 mg daily o COPD: Symbicort 160/4.5 mg 2 puffs BID, albuterol HFA PRN o T2DM: metformin 500 mg daily; Eugene Henderson requests a glucometer prescription  o Hyperthyroidism: methimazole 20 mg BID o Hep C: Epclusa 400/100 mg daily  Pharmacist Clinical Goal(s):  Marland Kitchen Over the next 90 days, patient will work with PharmD and provider towards optimized medication management  Interventions: . Eugene Henderson was out and declined a medication review day. Denied any medication related questions or concerns. Notes that patient has follow up with cardiology later this month, and she will address any concerns then . Will alert Dr. Laural Benes to the glucometer request. Patient likely does not need to check regularly d/t metformin monotherapy.   Patient Self Care Activities:  . Patient will take medications as prescribed  Initial goal documentation        Fall Risk: Fall Risk  06/21/2019 05/16/2018 04/11/2018 10/04/2017 07/05/2017  Falls in the past year? 0 1 0 No Yes  Number falls in past yr: 0 0 - - 1  Injury with Fall? 0 0 - - No  Risk for fall due to : - - - - -  Follow up - Falls evaluation completed Falls evaluation completed - -    FALL RISK PREVENTION PERTAINING TO THE HOME:  Any stairs in or around the home? Yes  If so, are there any without handrails? No   Home free of loose throw rugs in walkways, pet beds, electrical cords, etc? Yes  Adequate lighting in your home to reduce risk of falls? Yes   ASSISTIVE DEVICES UTILIZED TO PREVENT FALLS:  Life alert? No  Use of a cane, walker or w/c? Yes  walker or  cane  Grab bars in the bathroom? Yes  Shower chair or bench in shower? Yes  Elevated toilet seat or a handicapped toilet? No   TIMED UP AND GO:  Unable to perform   Depression Screen PHQ 2/9 Scores 06/21/2019 05/16/2018 10/04/2017 07/05/2017  PHQ - 2 Score 4 1 0 1  PHQ- 9 Score 19 7 - -    Cognitive Function     6CIT Screen 05/16/2018  What Year? 0 points  What month? 0 points  What time? 0 points  Count back from 20 0 points  Months in reverse 2 points  Repeat phrase 2 points  Total Score 4    Immunization History  Administered Date(s) Administered  . Hepatitis A, Adult 04/11/2018  . Hepatitis B, adult 04/11/2018, 05/16/2018  . Influenza, High Dose Seasonal PF 05/09/2017, 04/11/2018  . Influenza-Unspecified 02/07/2017  . Pneumococcal Conjugate-13 04/11/2018  . Pneumococcal Polysaccharide-23 08/02/2016  . Td 05/09/2017    Qualifies for Shingles Vaccine? Yes  Zostavax completed n/a. Due for Shingrix. Education has been provided regarding the importance of this vaccine. Pt has been advised to call insurance company to determine out of pocket expense. Advised may also receive vaccine at local pharmacy or Health Dept. Verbalized acceptance and understanding.  Tdap: up to date   Flu Vaccine:  Due now.   Pneumococcal Vaccine: up to date   Screening Tests Health Maintenance  Topic Date Due  . OPHTHALMOLOGY  EXAM  04/07/1961  . INFLUENZA VACCINE  01/06/2019  . FOOT EXAM  04/12/2019  . HEMOGLOBIN A1C  06/13/2019  . COLONOSCOPY  06/20/2020 (Originally 04/07/2001)  . URINE MICROALBUMIN  12/11/2019  . TETANUS/TDAP  05/10/2027  . Hepatitis C Screening  Completed  . PNA vac Low Risk Adult  Completed   Cancer Screenings:  Colorectal Screening: declined   Lung Cancer Screening: (Low Dose CT Chest recommended if Age 18-80 years, 30 pack-year currently smoking OR have quit w/in 15years.) does not qualify.   Additional Screening:  Hepatitis C Screening: does qualify;  Completed 12/11/2018  Vision Screening: Recommended annual ophthalmology exams for early detection of glaucoma and other disorders of the eye. Is the patient up to date with their annual eye exam?  No   Patient will call and scheduled diabetic eye exam.    Dental Screening: Recommended annual dental exams for proper oral hygiene  Community Resource Referral:  CRR required this visit?  No        Plan:  I have personally reviewed and addressed the Medicare Annual Wellness questionnaire and have noted the following in the patient's chart:  A. Medical and social history B. Use of alcohol, tobacco or illicit drugs  C. Current medications and supplements D. Functional ability and status E.  Nutritional status F.  Physical activity G. Advance directives H. List of other physicians I.  Hospitalizations, surgeries, and ER visits in previous 12 months J.  Vitals K. Screenings such as hearing and vision if needed, cognitive and depression L. Referrals and appointments   In addition, I have reviewed and discussed with patient certain preventive protocols, quality metrics, and best practice recommendations. A written personalized care plan for preventive services as well as general preventive health recommendations were provided to patient.   Signed,   Collene Schlichter, LPN  [id number] Nurse Health Advisor   Nurse Notes: none

## 2019-06-22 ENCOUNTER — Telehealth: Payer: Self-pay | Admitting: Family Medicine

## 2019-06-22 NOTE — Telephone Encounter (Signed)
-----   Message from Megan P Pates, DO sent at 06/21/2019  1:35 PM EST ----- 2 weeks BP anc physical- please call daughter (wendy) to have him scheduled to come in for blood work ASAP orders in 

## 2019-06-24 NOTE — Assessment & Plan Note (Signed)
Not under good control. Needs to get in with cardiology again- unclear if he's taking his entresto. Will recheck 2 weeks.

## 2019-06-24 NOTE — Assessment & Plan Note (Signed)
Will continue to follow with cardiology. Continue to monitor. Call with any concerns.

## 2019-06-24 NOTE — Assessment & Plan Note (Signed)
S/p treatment. Continue to monitor. Call with any concerns. 

## 2019-06-24 NOTE — Assessment & Plan Note (Signed)
Will get him rescheduled for follow up. Has not kept appointments. Will continue to follow with cardiology.

## 2019-06-24 NOTE — Assessment & Plan Note (Signed)
Due for recheck on his labs. Will get him in ASAP. Await results. Treat as needed.

## 2019-06-24 NOTE — Assessment & Plan Note (Signed)
Under good control on current regimen. Continue current regimen. Continue to monitor. Call with any concerns. Refills given. Labs to be drawn ASAP.   

## 2019-06-24 NOTE — Assessment & Plan Note (Signed)
Will continue to follow with cardiology. Continue to monitor. Call with any concerns.  

## 2019-06-24 NOTE — Assessment & Plan Note (Signed)
Due for recheck on his labs. Will get him in ASAP. Await results. Treat as needed.  

## 2019-06-26 ENCOUNTER — Other Ambulatory Visit: Payer: Self-pay | Admitting: Family Medicine

## 2019-07-10 ENCOUNTER — Telehealth: Payer: Self-pay | Admitting: Family Medicine

## 2019-07-10 NOTE — Telephone Encounter (Signed)
-----   Message from Dorcas Carrow, DO sent at 06/21/2019  1:35 PM EST ----- 2 weeks BP anc physical- please call daughter (wendy) to have him scheduled to come in for blood work ASAP orders in

## 2019-07-10 NOTE — Telephone Encounter (Signed)
Called pt to get him scheduled for fu, no answer

## 2019-07-11 ENCOUNTER — Encounter: Payer: Self-pay | Admitting: Family Medicine

## 2019-07-11 NOTE — Telephone Encounter (Signed)
Letter sent through mychart and mail.  

## 2019-07-18 ENCOUNTER — Telehealth: Payer: Self-pay

## 2019-08-05 ENCOUNTER — Other Ambulatory Visit: Payer: Self-pay | Admitting: Family Medicine

## 2019-08-08 ENCOUNTER — Ambulatory Visit: Payer: Self-pay | Admitting: General Practice

## 2019-08-08 ENCOUNTER — Telehealth: Payer: Self-pay

## 2019-08-08 NOTE — Chronic Care Management (AMB) (Signed)
  Chronic Care Management   Outreach Note  08/08/2019 Name: Eugene Henderson MRN: [id number] DOB: 05-13-1951  Referred by: Dorcas Carrow, DO Reason for referral : Chronic Care Management (Follow up: 2nd attempt/Chronic Disease management and care coordination needs )   A second unsuccessful telephone outreach was attempted today. The patient was referred to the case management team for assistance with care management and care coordination.   Follow Up Plan: A HIPPA compliant phone message was left for the patient providing contact information and requesting a return call.   Alto Denver RN, MSN, CCM Community Care Coordinator [place]  [facility] Mobile: [phone]

## 2019-08-24 ENCOUNTER — Inpatient Hospital Stay
Admission: EM | Admit: 2019-08-24 | Discharge: 2019-08-27 | DRG: 377 | Disposition: A | Discharge status: Home / Self Care | Payer: Medicare HMO | Attending: Internal Medicine | Admitting: Internal Medicine

## 2019-08-24 ENCOUNTER — Emergency Department: Payer: Medicare HMO

## 2019-08-24 ENCOUNTER — Other Ambulatory Visit: Payer: Self-pay

## 2019-08-24 ENCOUNTER — Encounter: Payer: Self-pay | Admitting: Emergency Medicine

## 2019-08-24 DIAGNOSIS — M25561 Pain in right knee: Secondary | ICD-10-CM | POA: Diagnosis present

## 2019-08-24 DIAGNOSIS — Z7952 Long term (current) use of systemic steroids: Secondary | ICD-10-CM

## 2019-08-24 DIAGNOSIS — E0590 Thyrotoxicosis, unspecified without thyrotoxic crisis or storm: Secondary | ICD-10-CM | POA: Diagnosis present

## 2019-08-24 DIAGNOSIS — D638 Anemia in other chronic diseases classified elsewhere: Secondary | ICD-10-CM | POA: Diagnosis present

## 2019-08-24 DIAGNOSIS — K922 Gastrointestinal hemorrhage, unspecified: Secondary | ICD-10-CM

## 2019-08-24 DIAGNOSIS — R140 Abdominal distension (gaseous): Secondary | ICD-10-CM | POA: Diagnosis present

## 2019-08-24 DIAGNOSIS — K640 First degree hemorrhoids: Secondary | ICD-10-CM | POA: Diagnosis not present

## 2019-08-24 DIAGNOSIS — R0602 Shortness of breath: Secondary | ICD-10-CM | POA: Diagnosis not present

## 2019-08-24 DIAGNOSIS — I5021 Acute systolic (congestive) heart failure: Secondary | ICD-10-CM | POA: Diagnosis not present

## 2019-08-24 DIAGNOSIS — Z7984 Long term (current) use of oral hypoglycemic drugs: Secondary | ICD-10-CM

## 2019-08-24 DIAGNOSIS — I4891 Unspecified atrial fibrillation: Secondary | ICD-10-CM | POA: Diagnosis not present

## 2019-08-24 DIAGNOSIS — E039 Hypothyroidism, unspecified: Secondary | ICD-10-CM | POA: Diagnosis present

## 2019-08-24 DIAGNOSIS — F17210 Nicotine dependence, cigarettes, uncomplicated: Secondary | ICD-10-CM | POA: Diagnosis present

## 2019-08-24 DIAGNOSIS — I480 Paroxysmal atrial fibrillation: Secondary | ICD-10-CM | POA: Diagnosis not present

## 2019-08-24 DIAGNOSIS — E785 Hyperlipidemia, unspecified: Secondary | ICD-10-CM

## 2019-08-24 DIAGNOSIS — K5730 Diverticulosis of large intestine without perforation or abscess without bleeding: Secondary | ICD-10-CM | POA: Diagnosis not present

## 2019-08-24 DIAGNOSIS — K5731 Diverticulosis of large intestine without perforation or abscess with bleeding: Secondary | ICD-10-CM | POA: Diagnosis not present

## 2019-08-24 DIAGNOSIS — H5461 Unqualified visual loss, right eye, normal vision left eye: Secondary | ICD-10-CM | POA: Diagnosis present

## 2019-08-24 DIAGNOSIS — I10 Essential (primary) hypertension: Secondary | ICD-10-CM | POA: Diagnosis not present

## 2019-08-24 DIAGNOSIS — Z833 Family history of diabetes mellitus: Secondary | ICD-10-CM | POA: Diagnosis not present

## 2019-08-24 DIAGNOSIS — I5022 Chronic systolic (congestive) heart failure: Secondary | ICD-10-CM | POA: Diagnosis not present

## 2019-08-24 DIAGNOSIS — I4819 Other persistent atrial fibrillation: Secondary | ICD-10-CM | POA: Diagnosis present

## 2019-08-24 DIAGNOSIS — Z79899 Other long term (current) drug therapy: Secondary | ICD-10-CM

## 2019-08-24 DIAGNOSIS — Z7951 Long term (current) use of inhaled steroids: Secondary | ICD-10-CM | POA: Diagnosis not present

## 2019-08-24 DIAGNOSIS — Z720 Tobacco use: Secondary | ICD-10-CM | POA: Diagnosis present

## 2019-08-24 DIAGNOSIS — I714 Abdominal aortic aneurysm, without rupture, unspecified: Secondary | ICD-10-CM | POA: Diagnosis present

## 2019-08-24 DIAGNOSIS — I5020 Unspecified systolic (congestive) heart failure: Secondary | ICD-10-CM | POA: Diagnosis not present

## 2019-08-24 DIAGNOSIS — K921 Melena: Secondary | ICD-10-CM | POA: Diagnosis not present

## 2019-08-24 DIAGNOSIS — Z20822 Contact with and (suspected) exposure to covid-19: Secondary | ICD-10-CM | POA: Diagnosis present

## 2019-08-24 DIAGNOSIS — E876 Hypokalemia: Secondary | ICD-10-CM | POA: Diagnosis present

## 2019-08-24 DIAGNOSIS — K621 Rectal polyp: Secondary | ICD-10-CM | POA: Diagnosis not present

## 2019-08-24 DIAGNOSIS — I5023 Acute on chronic systolic (congestive) heart failure: Secondary | ICD-10-CM | POA: Diagnosis not present

## 2019-08-24 DIAGNOSIS — E1159 Type 2 diabetes mellitus with other circulatory complications: Secondary | ICD-10-CM

## 2019-08-24 DIAGNOSIS — K5790 Diverticulosis of intestine, part unspecified, without perforation or abscess without bleeding: Secondary | ICD-10-CM | POA: Diagnosis not present

## 2019-08-24 DIAGNOSIS — I428 Other cardiomyopathies: Secondary | ICD-10-CM | POA: Diagnosis present

## 2019-08-24 DIAGNOSIS — Z7901 Long term (current) use of anticoagulants: Secondary | ICD-10-CM

## 2019-08-24 DIAGNOSIS — K635 Polyp of colon: Secondary | ICD-10-CM | POA: Diagnosis not present

## 2019-08-24 DIAGNOSIS — J449 Chronic obstructive pulmonary disease, unspecified: Secondary | ICD-10-CM | POA: Diagnosis present

## 2019-08-24 DIAGNOSIS — F1010 Alcohol abuse, uncomplicated: Secondary | ICD-10-CM | POA: Diagnosis not present

## 2019-08-24 DIAGNOSIS — I110 Hypertensive heart disease with heart failure: Secondary | ICD-10-CM | POA: Diagnosis not present

## 2019-08-24 DIAGNOSIS — K648 Other hemorrhoids: Secondary | ICD-10-CM | POA: Diagnosis not present

## 2019-08-24 DIAGNOSIS — E119 Type 2 diabetes mellitus without complications: Secondary | ICD-10-CM | POA: Diagnosis not present

## 2019-08-24 DIAGNOSIS — Z1211 Encounter for screening for malignant neoplasm of colon: Secondary | ICD-10-CM | POA: Diagnosis not present

## 2019-08-24 DIAGNOSIS — I2510 Atherosclerotic heart disease of native coronary artery without angina pectoris: Secondary | ICD-10-CM | POA: Diagnosis not present

## 2019-08-24 LAB — BASIC METABOLIC PANEL
Anion gap: 9 (ref 5–15)
BUN: 11 mg/dL (ref 8–23)
CO2: 22 mmol/L (ref 22–32)
Calcium: 8.5 mg/dL — ABNORMAL LOW (ref 8.9–10.3)
Chloride: 107 mmol/L (ref 98–111)
Creatinine, Ser: 0.66 mg/dL (ref 0.61–1.24)
GFR calc Af Amer: >60 mL/min (ref >60)
GFR calc non Af Amer: >60 mL/min (ref >60)
Glucose, Bld: 110 mg/dL — ABNORMAL HIGH (ref 70–99)
Potassium: 4.2 mmol/L (ref 3.5–5.1)
Sodium: 138 mmol/L (ref 135–145)

## 2019-08-24 LAB — HEPATIC FUNCTION PANEL
ALT: 30 U/L (ref 0–44)
AST: 30 U/L (ref 15–41)
Albumin: 4.2 g/dL (ref 3.5–5.0)
Alkaline Phosphatase: 82 U/L (ref 38–126)
Bilirubin, Direct: 0.4 mg/dL — ABNORMAL HIGH (ref 0.0–0.2)
Indirect Bilirubin: 1.2 mg/dL — ABNORMAL HIGH (ref 0.3–0.9)
Total Bilirubin: 1.6 mg/dL — ABNORMAL HIGH (ref 0.3–1.2)
Total Protein: 7.4 g/dL (ref 6.5–8.1)

## 2019-08-24 LAB — CBC
HCT: 35.2 % — ABNORMAL LOW (ref 39.0–52.0)
Hemoglobin: 11.9 g/dL — ABNORMAL LOW (ref 13.0–17.0)
MCH: 31 pg (ref 26.0–34.0)
MCHC: 33.8 g/dL (ref 30.0–36.0)
MCV: 91.7 fL (ref 80.0–100.0)
Platelets: 155 10*3/uL (ref 150–400)
RBC: 3.84 MIL/uL — ABNORMAL LOW (ref 4.22–5.81)
RDW: 16.5 % — ABNORMAL HIGH (ref 11.5–15.5)
WBC: 4.5 10*3/uL (ref 4.0–10.5)
nRBC: 0 % (ref 0.0–0.2)

## 2019-08-24 LAB — BRAIN NATRIURETIC PEPTIDE: B Natriuretic Peptide: 274 pg/mL — ABNORMAL HIGH (ref 0.0–100.0)

## 2019-08-24 LAB — PROTIME-INR
INR: 1.1 (ref 0.8–1.2)
Prothrombin Time: 13.9 seconds (ref 11.4–15.2)

## 2019-08-24 MED ORDER — LORAZEPAM 1 MG PO TABS: 1.0000 mg | ORAL_TABLET | ORAL | Status: DC | PRN

## 2019-08-24 MED ORDER — ALBUTEROL SULFATE HFA 108 (90 BASE) MCG/ACT IN AERS
2.0000 | INHALATION_SPRAY | Freq: Four times a day (QID) | RESPIRATORY_TRACT | Status: DC | PRN
  Filled 2019-08-24: qty 6.7

## 2019-08-24 MED ORDER — FOLIC ACID 1 MG PO TABS
1.0000 mg | ORAL_TABLET | Freq: Every day | ORAL | Status: DC
  Administered 2019-08-25 – 2019-08-27 (×3): 1 mg via ORAL
  Filled 2019-08-24 (×3): qty 1

## 2019-08-24 MED ORDER — ADULT MULTIVITAMIN W/MINERALS CH
1.0000 | ORAL_TABLET | Freq: Every day | ORAL | Status: DC
  Administered 2019-08-26 – 2019-08-27 (×2): 1 via ORAL
  Filled 2019-08-24 (×2): qty 1

## 2019-08-24 MED ORDER — THIAMINE HCL 100 MG PO TABS
100.0000 mg | ORAL_TABLET | Freq: Every day | ORAL | Status: DC
  Administered 2019-08-25 – 2019-08-27 (×3): 100 mg via ORAL
  Filled 2019-08-24 (×3): qty 1

## 2019-08-24 MED ORDER — IPRATROPIUM-ALBUTEROL 0.5-2.5 (3) MG/3ML IN SOLN
3.0000 mL | Freq: Once | RESPIRATORY_TRACT | Status: AC
  Administered 2019-08-24: 3 mL via RESPIRATORY_TRACT
  Filled 2019-08-24: qty 3

## 2019-08-24 MED ORDER — SODIUM CHLORIDE 0.9% IV SOLUTION
Freq: Once | INTRAVENOUS | Status: AC
  Filled 2019-08-24: qty 250

## 2019-08-24 MED ORDER — THIAMINE HCL 100 MG/ML IJ SOLN: 100.0000 mg | Freq: Every day | INTRAMUSCULAR | Status: DC

## 2019-08-24 MED ORDER — FUROSEMIDE 40 MG PO TABS
40.0000 mg | ORAL_TABLET | Freq: Every day | ORAL | Status: DC
  Administered 2019-08-25 – 2019-08-27 (×3): 40 mg via ORAL
  Filled 2019-08-24 (×3): qty 1

## 2019-08-24 MED ORDER — POTASSIUM CHLORIDE CRYS ER 20 MEQ PO TBCR
20.0000 meq | EXTENDED_RELEASE_TABLET | Freq: Every day | ORAL | Status: DC
  Administered 2019-08-25 – 2019-08-26 (×2): 20 meq via ORAL
  Filled 2019-08-24 (×2): qty 1

## 2019-08-24 MED ORDER — LORAZEPAM 2 MG/ML IJ SOLN: 1.0000 mg | INTRAMUSCULAR | Status: DC | PRN

## 2019-08-24 MED ORDER — SODIUM CHLORIDE 0.9 % IV SOLN
8.0000 mg/h | INTRAVENOUS | Status: DC
  Administered 2019-08-25: 8 mg/h via INTRAVENOUS
  Filled 2019-08-24 (×2): qty 80

## 2019-08-24 MED ORDER — METOPROLOL SUCCINATE ER 25 MG PO TB24
25.0000 mg | ORAL_TABLET | Freq: Every day | ORAL | Status: DC
  Administered 2019-08-25: 11:00:00 25 mg via ORAL
  Filled 2019-08-24: qty 1

## 2019-08-24 MED ORDER — PANTOPRAZOLE SODIUM 40 MG IV SOLR
40.0000 mg | Freq: Once | INTRAVENOUS | Status: AC
  Administered 2019-08-24: 40 mg via INTRAVENOUS
  Filled 2019-08-24: qty 40

## 2019-08-24 MED ORDER — SODIUM CHLORIDE 0.9 % IV SOLN
50.0000 ug/h | INTRAVENOUS | Status: DC
  Filled 2019-08-24 (×2): qty 1

## 2019-08-24 MED ORDER — DIGOXIN 125 MCG PO TABS
125.0000 ug | ORAL_TABLET | Freq: Every day | ORAL | Status: DC
  Administered 2019-08-25 – 2019-08-27 (×3): 125 ug via ORAL
  Filled 2019-08-24 (×3): qty 1

## 2019-08-24 MED ORDER — ATORVASTATIN CALCIUM 20 MG PO TABS
40.0000 mg | ORAL_TABLET | Freq: Every day | ORAL | Status: DC
  Administered 2019-08-25 – 2019-08-27 (×3): 40 mg via ORAL
  Filled 2019-08-24 (×3): qty 2

## 2019-08-24 MED ORDER — METHIMAZOLE 10 MG PO TABS
20.0000 mg | ORAL_TABLET | Freq: Two times a day (BID) | ORAL | Status: DC
  Administered 2019-08-25: 04:00:00 20 mg via ORAL
  Filled 2019-08-24 (×3): qty 2

## 2019-08-24 MED ORDER — SPIRONOLACTONE 25 MG PO TABS
25.0000 mg | ORAL_TABLET | Freq: Every day | ORAL | Status: DC
  Administered 2019-08-25 – 2019-08-27 (×3): 25 mg via ORAL
  Filled 2019-08-24 (×3): qty 1

## 2019-08-24 MED ORDER — SODIUM CHLORIDE 0.9 % IV SOLN: INTRAVENOUS | Status: DC

## 2019-08-24 NOTE — ED Notes (Signed)
States sob occurred today with exertion. States he had BM today PTA that had red blood in it. Reports no other BM after this occurrence. No complaints at time in room relating to breathing or pain. Requests tv to be turn on and placed on basketball. Call light in reach

## 2019-08-24 NOTE — ED Notes (Signed)
VS obtained by this RN, this RN apologized and explained delay to patient. Pt states understanding. Visualized sitting in chair at this time. NAD noted by RN at this time.

## 2019-08-24 NOTE — ED Provider Notes (Signed)
[facility] Emergency Department Provider Note    First MD Initiated Contact with Patient 08/24/19 2109     (approximate)  I have reviewed the triage vital signs and the nursing notes.   HISTORY  Chief Complaint Shortness of Breath    HPI Eugene Henderson is a 69 y.o. male presents the ER for evaluation of bloody stools noted today in the commode associate with some shortness of breath.  Denies any abdominal pain.  Does have a history of heart failure and is on Eliquis.  States he been compliant with his medications.  States he also recently started drinking again.  Denies any known history of liver disease.  Has never had bleeding from his rectum.  Denies any nausea or vomiting.  Denies any chest pain.    Past Medical History:  Diagnosis Date  . Arthritis   . Ascending aortic aneurysm (HCC)    a. 09/2017 Stable TAA - 5.1cm.  . Asthma   . Chronic systolic CHF (congestive heart failure) (HCC)    a. EF 25-30% by echo in 07/2016 with cath showing no significant CAD b. 01/2017: EF 30-35% with diffuse HK and moderate MR; c. 07/2017 Echo: EF 30-35%, diff hK. Mild MR, mildly dil LA. PASP .  . Diverticulitis   . Diverticulitis of large intestine with perforation without abscess or bleeding 05/13/2017  . Hypertension   . Hyperthyroidism   . NICM (nonischemic cardiomyopathy) (HCC)   . Noncompliance   . Persistent atrial fibrillation (HCC)    a. CHA2DS2VASc = 3-->Eliquis (? compliance).   Family History  Problem Relation Age of Onset  . Diabetes Mother   . Diabetes Father   . Heart disease Brother   . Heart attack Brother   . Diabetes Brother   . Kidney failure Brother   . Thyroid disease Neg Hx    Past Surgical History:  Procedure Laterality Date  . JOINT REPLACEMENT Right   . RIGHT/LEFT HEART CATH AND CORONARY ANGIOGRAPHY N/A 08/02/2016   Procedure: Right/Left Heart Cath and Coronary Angiography;  Surgeon: Iran Ouch, MD;  Location: [facility]  INVASIVE CV LAB;  Service: Cardiovascular;  Laterality: N/A;  . TESTICLE SURGERY     Patient states that he had to have the tube fixed.   Patient Active Problem List   Diagnosis Date Noted  . Morbid obesity (HCC) 12/11/2018  . Chronic pain of right knee 12/11/2018  . Advance directive discussed with patient 05/16/2018  . Diabetes mellitus without complication (HCC) 04/11/2018  . 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 COPD (chronic obstructive pulmonary disease) (HCC) 02/03/2017  . 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) 12/15/2016  . AAA (abdominal aortic aneurysm) (HCC) 11/29/2016  . Chronic low back pain 11/29/2016  . Alcohol abuse 11/29/2016  . Hyponatremia 11/22/2016  . Chronic systolic heart failure (HCC) 08/19/2016  . Tobacco use 08/19/2016  . Snoring 08/19/2016  . Persistent atrial fibrillation (HCC) 07/28/2016  . Hyperthyroidism 07/28/2016  . Noncompliance with medications 07/28/2016  . Essential hypertension 07/28/2016      Prior to Admission medications   Medication Sig Start Date End Date Taking? Authorizing Provider  acetaminophen (TYLENOL) 500 MG tablet Take 500 mg by mouth every 6 (six) hours as needed.    [provider]  albuterol (PROVENTIL HFA;VENTOLIN HFA) 108 (90 Base) MCG/ACT inhaler Inhale 2 puffs into the lungs every 6 (six) hours as needed for wheezing or shortness of breath. 07/05/17   Delma Freeze, FNP  atorvastatin (LIPITOR)  40 MG tablet Take 1 tablet (40 mg total) by mouth daily. 06/21/19   Capozzi, Megan P, DO  budesonide-formoterol (SYMBICORT) 160-4.5 MCG/ACT inhaler Inhale 2 puffs into the lungs 2 (two) times daily. 06/21/19   Richer, Megan P, DO  Difluprednate (DUREZOL) 0.05 % EMUL Place 1 drop into the right eye every 4 (four) hours while awake.    [provider]  digoxin (LANOXIN) 0.125 MG tablet TAKE 1 TABLET BY MOUTH DAILY 05/26/19   Vantil, Megan P, DO  ELIQUIS 5 MG TABS tablet TAKE 1 TABLET BY MOUTH TWICE A DAY  08/05/19   Mccranie, Megan P, DO  furosemide (LASIX) 40 MG tablet Take 1 tablet (40 mg total) by mouth daily. 06/21/19   Tirpak, Megan P, DO  gentamicin (GARAMYCIN) 0.3 % ophthalmic solution Place 1 drop into both eyes every 4 (four) hours. 05/06/17   Joni Reining, PA-C  metFORMIN (GLUCOPHAGE XR) 500 MG 24 hr tablet Take 1 tablet (500 mg total) by mouth daily with breakfast. 06/21/19   Olevia Perches P, DO  methimazole (TAPAZOLE) 10 MG tablet Take 2 tablets (20 mg total) by mouth 2 (two) times daily. 06/21/19   Gonyer, Megan P, DO  metoprolol succinate (TOPROL-XL) 25 MG 24 hr tablet Take 1 tablet (25 mg total) by mouth daily. 06/21/19   Cottrell, Megan P, DO  moxifloxacin (VIGAMOX) 0.5 % ophthalmic solution Place 1 drop into the right eye every 4 (four) hours while awake.     [provider]  naphazoline-pheniramine (NAPHCON-A) 0.025-0.3 % ophthalmic solution Place 1 drop into both eyes 4 (four) times daily as needed for eye irritation. 05/06/17   Joni Reining, PA-C  potassium chloride SA (KLOR-CON) 20 MEQ tablet Take 1 tablet (20 mEq total) by mouth daily. 06/21/19   Shular, Megan P, DO  prednisoLONE acetate (PRED FORTE) 1 % ophthalmic suspension Place 1 drop into the right eye every 2 (two) hours while awake.     [provider]  sacubitril-valsartan (ENTRESTO) 24-26 MG Take 1 tablet by mouth 2 (two) times daily.    [provider]  Sofosbuvir-Velpatasvir (EPCLUSA) 400-100 MG TABS Take 1 tablet by mouth daily.    [provider]  spironolactone (ALDACTONE) 25 MG tablet Take 1 tablet (25 mg total) by mouth daily. 06/21/19   Wenger, Megan P, DO  valACYclovir (VALTREX) 1000 MG tablet Take 1 tablet by mouth daily. 06/04/18   [provider]    Allergies Patient has no known allergies.    Social History Social History   Tobacco Use  . Smoking status: Current Some Day Smoker    Packs/day: 0.25    Years: 25.00    Pack years: 6.25    Types:  Cigarettes  . Smokeless tobacco: Never Used  . Tobacco comment: pipe tobacco   Substance Use Topics  . Alcohol use: Yes    Comment: Socially - once a month  . Drug use: No    Review of Systems Patient denies headaches, rhinorrhea, blurry vision, numbness, shortness of breath, chest pain, edema, cough, abdominal pain, nausea, vomiting, diarrhea, dysuria, fevers, rashes or hallucinations unless otherwise stated above in HPI. ____________________________________________   PHYSICAL EXAM:  VITAL SIGNS: Vitals:   08/24/19 2106 08/24/19 2130  BP: (!) 144/109 (!) 134/103  Pulse: (!) 101 78  Resp: 20 20  Temp:    SpO2: 99% 99%    Constitutional: Alert and oriented.  Eyes: Conjunctivae are normal.  Head: Atraumatic. Nose: No congestion/rhinnorhea. Mouth/Throat: Mucous membranes are  moist.   Neck: No stridor. Painless ROM.  Cardiovascular: Normal rate, regular rhythm. Grossly normal heart sounds.  Good peripheral circulation. Respiratory: Normal respiratory effort.  No retractions. Lungs with diffuse wheeze throughout Gastrointestinal: Soft and nontender. No distention. No abdominal bruits. No CVA tenderness. Genitourinary: Maroon-colored stool noted on DRE,  Small non bleeding hemorrhoid,no mass or fluctuance Musculoskeletal: No lower extremity tenderness nor edema.  No joint effusions. Neurologic:  Normal speech and language. No gross focal neurologic deficits are appreciated. No facial droop Skin:  Skin is warm, dry and intact. No rash noted. Psychiatric: Mood and affect are normal. Speech and behavior are normal.  ____________________________________________   LABS (all labs ordered are listed, but only abnormal results are displayed)  Results for orders placed or performed during the hospital encounter of 08/24/19 (from the past 24 hour(s))  Basic metabolic panel     Status: Abnormal   Collection Time: 08/24/19  1:55 PM  Result Value Ref Range   Sodium 138 135 - 145 mmol/L    Potassium 4.2 3.5 - 5.1 mmol/L   Chloride 107 98 - 111 mmol/L   CO2 22 22 - 32 mmol/L   Glucose, Bld 110 (H) 70 - 99 mg/dL   BUN 11 8 - 23 mg/dL   Creatinine, Ser 0.66 0.61 - 1.24 mg/dL   Calcium 8.5 (L) 8.9 - 10.3 mg/dL   GFR calc non Af Amer >60 >60 mL/min   GFR calc Af Amer >60 >60 mL/min   Anion gap 9 5 - 15  CBC     Status: Abnormal   Collection Time: 08/24/19  1:55 PM  Result Value Ref Range   WBC 4.5 4.0 - 10.5 K/uL   RBC 3.84 (L) 4.22 - 5.81 MIL/uL   Hemoglobin 11.9 (L) 13.0 - 17.0 g/dL   HCT 35.2 (L) 39.0 - 52.0 %   MCV 91.7 80.0 - 100.0 fL   MCH 31.0 26.0 - 34.0 pg   MCHC 33.8 30.0 - 36.0 g/dL   RDW 16.5 (H) 11.5 - 15.5 %   Platelets 155 150 - 400 K/uL   nRBC 0.0 0.0 - 0.2 %  Brain natriuretic peptide     Status: Abnormal   Collection Time: 08/24/19  1:55 PM  Result Value Ref Range   B Natriuretic Peptide 274.0 (H) 0.0 - 100.0 pg/mL   ____________________________________________  EKG My review and personal interpretation at Time: 13:51   Indication: sob  Rate: 100  Rhythm: afib Axis: normal Other: normal intervals, no stemi ____________________________________________  RADIOLOGY  I personally reviewed all radiographic images ordered to evaluate for the above acute complaints and reviewed radiology reports and findings.  These findings were personally discussed with the patient.  Please see medical record for radiology report.  ____________________________________________   PROCEDURES  Procedure(s) performed:  .Critical Care Performed by: Merlyn Lot, MD Authorized by: Merlyn Lot, MD   Critical care provider statement:    Critical care time (minutes):  30   Critical care time was exclusive of:  Separately billable procedures and treating other patients (gi bleed)   Critical care was time spent personally by me on the following activities:  Development of treatment plan with patient or surrogate, discussions with consultants, evaluation of  patient's response to treatment, examination of patient, obtaining history from patient or surrogate, ordering and performing treatments and interventions, ordering and review of laboratory studies, ordering and review of radiographic studies, pulse oximetry, re-evaluation of patient's condition and review of old charts  Critical Care performed: yes ____________________________________________   INITIAL IMPRESSION / ASSESSMENT AND PLAN / ED COURSE  Pertinent labs & imaging results that were available during my care of the patient were reviewed by me and considered in my medical decision making (see chart for details).   DDX: ugib, lgib, pud, hemorrhoid, copd, chf,anemia  Eugene Henderson is a 69 y.o. who presents to the ED with symptoms as described above.  Patient with exam findings suggesting GI bleed.  Patient hemodynamically stable with normotensive no tachycardia.  Is not hypoxic.  Hemoglobin borderline at 11.9.  Presentation complicated by his history of A. fib, CHF as well as on Eliquis.  No reported history of cirrhosis.  No coffee-ground emesis.  Will give IV Protonix.  Do feel patient will require hospitalization for further medical management.     The patient was evaluated in Emergency Department today for the symptoms described in the history of present illness. He/she was evaluated in the context of the global COVID-19 pandemic, which necessitated consideration that the patient might be at risk for infection with the SARS-CoV-2 virus that causes COVID-19. Institutional protocols and algorithms that pertain to the evaluation of patients at risk for COVID-19 are in a state of rapid change based on information released by regulatory bodies including the CDC and federal and state organizations. These policies and algorithms were followed during the patient's care in the ED.  As part of my medical decision making, I reviewed the following data within the electronic MEDICAL RECORD NUMBER  Nursing notes reviewed and incorporated, Labs reviewed, notes from prior ED visits and [place] Controlled Substance Database   ____________________________________________   FINAL CLINICAL IMPRESSION(S) / ED DIAGNOSES  Final diagnoses:  Gastrointestinal hemorrhage, unspecified gastrointestinal hemorrhage type      NEW MEDICATIONS STARTED DURING THIS VISIT:  New Prescriptions   No medications on file     Note:  This document was prepared using Dragon voice recognition software and may include unintentional dictation errors.    Willy Eddy, MD 08/24/19 2256

## 2019-08-24 NOTE — H&P (Addendum)
History and Physical    Eugene Henderson LGX:[id number] DOB: February 09, 1951 DOA: 08/24/2019  PCP: Dorcas Carrow, DO  Patient coming from: home   Chief Complaint: gi bleeding  HPI: Eugene Henderson is a 69 y.o. male with medical history significant for sCHF, smoking, a-fib on anticoagulant, hyperthyroid, htn, AAA, hep c s/p treatment, dm, copd, who presents with above.  Denies history of GI bleed. Last colonoscopy about 7 years ago in [place], [place], pt says was normal. Denies history endoscopy. Denies history varices or bleeding ulcer. Denies recent melena.  This morning while sitting on couch performed a valsalva maneuver to attempt to suppress a cough and had a spontaneous bowel movement. When he went to the bathroom to investigate, he found that he had passed bright red blood mixed with stool. Moderate in amount. Has not happened since. Denies abdominal pain, denies recent constipation or diarrhea. Denies nausea/vomiting. Denies nsaid use but does take daily eliquis. Denies chest pain or palpitations. Feeling well, does not feel short of breath or weak. No hemoptysis.   Daughter says pt drinks daily, multiple drinks a day. Thinks may have withdrawn from alcohol many years in the past.   ED Course: labs, protonix. EDP observed red/maroon blood at rectum.  Review of Systems: As per HPI otherwise 10 point review of systems negative.    Past Medical History:  Diagnosis Date  . Arthritis   . Ascending aortic aneurysm (HCC)    a. 09/2017 Stable TAA - 5.1cm.  . Asthma   . Chronic systolic CHF (congestive heart failure) (HCC)    a. EF 25-30% by echo in 07/2016 with cath showing no significant CAD b. 01/2017: EF 30-35% with diffuse HK and moderate MR; c. 07/2017 Echo: EF 30-35%, diff hK. Mild MR, mildly dil LA. PASP .  . Diverticulitis   . Diverticulitis of large intestine with perforation without abscess or bleeding 05/13/2017  . Hypertension   . Hyperthyroidism   . NICM (nonischemic  cardiomyopathy) (HCC)   . Noncompliance   . Persistent atrial fibrillation (HCC)    a. CHA2DS2VASc = 3-->Eliquis (? compliance).    Past Surgical History:  Procedure Laterality Date  . JOINT REPLACEMENT Right   . RIGHT/LEFT HEART CATH AND CORONARY ANGIOGRAPHY N/A 08/02/2016   Procedure: Right/Left Heart Cath and Coronary Angiography;  Surgeon: Iran Ouch, MD;  Location: [facility] INVASIVE CV LAB;  Service: Cardiovascular;  Laterality: N/A;  . TESTICLE SURGERY     Patient states that he had to have the tube fixed.     reports that he has been smoking cigarettes. He has a 6.25 pack-year smoking history. He has never used smokeless tobacco. He reports current alcohol use. He reports that he does not use drugs.  No Known Allergies  Family History  Problem Relation Age of Onset  . Diabetes Mother   . Diabetes Father   . Heart disease Brother   . Heart attack Brother   . Diabetes Brother   . Kidney failure Brother   . Thyroid disease Neg Hx     Prior to Admission medications   Medication Sig Start Date End Date Taking? Authorizing Provider  acetaminophen (TYLENOL) 500 MG tablet Take 500 mg by mouth every 6 (six) hours as needed.    [provider]  albuterol (PROVENTIL HFA;VENTOLIN HFA) 108 (90 Base) MCG/ACT inhaler Inhale 2 puffs into the lungs every 6 (six) hours as needed for wheezing or shortness of breath. 07/05/17   Delma Freeze, FNP  atorvastatin (LIPITOR) 40  MG tablet Take 1 tablet (40 mg total) by mouth daily. 06/21/19   Karpel, Megan P, DO  budesonide-formoterol (SYMBICORT) 160-4.5 MCG/ACT inhaler Inhale 2 puffs into the lungs 2 (two) times daily. 06/21/19   Rosenberry, Megan P, DO  Difluprednate (DUREZOL) 0.05 % EMUL Place 1 drop into the right eye every 4 (four) hours while awake.    [provider]  digoxin (LANOXIN) 0.125 MG tablet TAKE 1 TABLET BY MOUTH DAILY 05/26/19   Vaden, Megan P, DO  ELIQUIS 5 MG TABS tablet TAKE 1 TABLET BY MOUTH TWICE A DAY  08/05/19   Wernick, Megan P, DO  furosemide (LASIX) 40 MG tablet Take 1 tablet (40 mg total) by mouth daily. 06/21/19   Hopkins, Megan P, DO  gentamicin (GARAMYCIN) 0.3 % ophthalmic solution Place 1 drop into both eyes every 4 (four) hours. 05/06/17   Joni Reining, PA-C  metFORMIN (GLUCOPHAGE XR) 500 MG 24 hr tablet Take 1 tablet (500 mg total) by mouth daily with breakfast. 06/21/19   Olevia Perches P, DO  methimazole (TAPAZOLE) 10 MG tablet Take 2 tablets (20 mg total) by mouth 2 (two) times daily. 06/21/19   Miyazaki, Megan P, DO  metoprolol succinate (TOPROL-XL) 25 MG 24 hr tablet Take 1 tablet (25 mg total) by mouth daily. 06/21/19   Cobarrubias, Megan P, DO  moxifloxacin (VIGAMOX) 0.5 % ophthalmic solution Place 1 drop into the right eye every 4 (four) hours while awake.     [provider]  naphazoline-pheniramine (NAPHCON-A) 0.025-0.3 % ophthalmic solution Place 1 drop into both eyes 4 (four) times daily as needed for eye irritation. 05/06/17   Joni Reining, PA-C  potassium chloride SA (KLOR-CON) 20 MEQ tablet Take 1 tablet (20 mEq total) by mouth daily. 06/21/19   Hovater, Megan P, DO  prednisoLONE acetate (PRED FORTE) 1 % ophthalmic suspension Place 1 drop into the right eye every 2 (two) hours while awake.     [provider]  sacubitril-valsartan (ENTRESTO) 24-26 MG Take 1 tablet by mouth 2 (two) times daily.    [provider]  Sofosbuvir-Velpatasvir (EPCLUSA) 400-100 MG TABS Take 1 tablet by mouth daily.    [provider]  spironolactone (ALDACTONE) 25 MG tablet Take 1 tablet (25 mg total) by mouth daily. 06/21/19   Pinnix, Megan P, DO  valACYclovir (VALTREX) 1000 MG tablet Take 1 tablet by mouth daily. 06/04/18   [provider]    Physical Exam: Vitals:   08/24/19 1345 08/24/19 1650 08/24/19 2106 08/24/19 2130  BP: (!) 142/105 (!) 133/97 (!) 144/109 (!) 134/103  Pulse: 71 62 (!) 101 78  Resp: 18 20 20 20   Temp: 98 F (36.7 C)     SpO2:  98% 97% 99% 99%  Weight: 117.9 kg     Height: 6\' 1"  (1.854 m)       Constitutional: No acute distress Head: Atraumatic Eyes: right cornea cloudy ENM: Moist mucous membranes. Normal dentition.  Neck: Supple Respiratory: Clear to auscultation bilaterally, no wheezing/rales/rhonchi. Normal respiratory effort. No accessory muscle use. . Cardiovascular: irreg irreg. No murmurs/rubs/gallops. Abdomen: Non-tender, obese. No masses. No rebound or guarding. Positive bowel sounds. Musculoskeletal: No joint deformity upper and lower extremities. Normal ROM, no contractures. Normal muscle tone.  Skin: No rashes, lesions, or ulcers.  Extremities: trace LE peripheral edema. Palpable peripheral pulses. Neurologic: Alert, moving all 4 extremities. Psychiatric: Normal insight and judgement.   Labs on Admission: I have personally reviewed following labs and imaging studies  CBC:  Recent Labs  Lab 08/24/19 1355  WBC 4.5  HGB 11.9*  HCT 35.2*  MCV 91.7  PLT 155   Basic Metabolic Panel: Recent Labs  Lab 08/24/19 1355  NA 138  K 4.2  CL 107  CO2 22  GLUCOSE 110*  BUN 11  CREATININE 0.66  CALCIUM 8.5*   GFR: Estimated Creatinine Clearance: 118.9 mL/min (by C-G formula based on SCr of 0.66 mg/dL). Liver Function Tests: No results for input(s): AST, ALT, ALKPHOS, BILITOT, PROT, ALBUMIN in the last 168 hours. No results for input(s): LIPASE, AMYLASE in the last 168 hours. No results for input(s): AMMONIA in the last 168 hours. Coagulation Profile: No results for input(s): INR, PROTIME in the last 168 hours. Cardiac Enzymes: No results for input(s): CKTOTAL, CKMB, CKMBINDEX, TROPONINI in the last 168 hours. BNP (last 3 results) No results for input(s): PROBNP in the last 8760 hours. HbA1C: No results for input(s): HGBA1C in the last 72 hours. CBG: No results for input(s): GLUCAP in the last 168 hours. Lipid Profile: No results for input(s): CHOL, HDL, LDLCALC, TRIG, CHOLHDL,  LDLDIRECT in the last 72 hours. Thyroid Function Tests: No results for input(s): TSH, T4TOTAL, FREET4, T3FREE, THYROIDAB in the last 72 hours. Anemia Panel: No results for input(s): VITAMINB12, FOLATE, FERRITIN, TIBC, IRON, RETICCTPCT in the last 72 hours. Urine analysis:    Component Value Date/Time   COLORURINE YELLOW (A) 11/22/2016 1446   APPEARANCEUR Clear 04/11/2018 1458   LABSPEC 1.008 11/22/2016 1446   PHURINE 6.0 11/22/2016 1446   GLUCOSEU Negative 04/11/2018 1458   HGBUR NEGATIVE 11/22/2016 1446   BILIRUBINUR Negative 04/11/2018 1458   KETONESUR NEGATIVE 11/22/2016 1446   PROTEINUR Negative 04/11/2018 1458   PROTEINUR NEGATIVE 11/22/2016 1446   NITRITE Negative 04/11/2018 1458   NITRITE NEGATIVE 11/22/2016 1446   LEUKOCYTESUR Negative 04/11/2018 1458    Radiological Exams on Admission: DG Chest 2 View  Result Date: 08/24/2019 CLINICAL DATA:  Shortness of breath. EXAM: CHEST - 2 VIEW COMPARISON:  March 02, 2018. FINDINGS: Mild cardiomegaly is noted. No pneumothorax or pleural effusion is noted. Both lungs are clear. The visualized skeletal structures are unremarkable. IMPRESSION: No active cardiopulmonary disease. Electronically Signed   By: Lupita Raider M.D.   On: 08/24/2019 15:02    EKG: Independently reviewed. A fib, 103/min  Assessment/Plan Principal Problem:   Lower GI bleed Active Problems:   Persistent atrial fibrillation (HCC)   Hyperthyroidism   Essential hypertension   Chronic systolic heart failure (HCC)   Tobacco use   AAA (abdominal aortic aneurysm) (HCC)   Alcohol abuse   COPD (chronic obstructive pulmonary disease) (HCC)   Diabetes mellitus without complication (HCC)   # Acute lower GI bleed - no hx of but is on eliquis. Hemodynamically stable, has had one episode, today. H 11.9 from baseline of normal. No chest pain. Has hx of hcv and etoh but no known cirrhosis or varices, and denies hemoptysis, and bun is normal - low suspicion for upper  GI bleed. Says last colonoscopy was 7 years ago and normal, but that report not available to confirm. Denies hx EGD.  - 2 units held - 2nd iv ordered to be inserted - continue protonix ordered by edp, told edp can d/c octreotide - AM CBC - hold eliquis - npo at midnight, fluids @ 75 - f/u covid, pt is asymptomatic  # Alcohol abuse - heavy drinker, daughter reports distant history of withdrawal. Last drink earlier today. Currently showing no signs of withdrawal. -  monitor on CIWA.  # sCHF # a-fib - last ef in 2019 30-35%, overdue to see cardiology. bnp mildly elevated today, does not appear to be in chf exacerbation - cont digoxin, metoprolol, spironolactone, lasix. Low threshold to stop diuretics if ongoing bleeding or hemodynamic changes. - hold eliquis as above  # HTN here bp wnl - cont meds as above, and home statin  # hyperthyroidism - no recent TFTs available to review - f/u tsh, cont home methimazole  # T2DM - not on meds at home, here random glucose normal - f/u a1c  # hypokalemia - chronic, here k wnl - cont home kcl for now, repeat am K  # r eye keratitis - formerly followed by [facility], has not followed-up, currently off all eye drops. 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 - outpt f/u  DVT prophylaxis: SCDs Code Status: full  Family Communication: daughter wendy  Disposition Plan: tbd  Consults called: none  Admission status: med/surg    Desma Maxim MD [facility] Pager [phone]  If 7PM-7AM, please contact night-coverage [email or web address] Password [facility]  08/24/2019, 11:18 PM

## 2019-08-24 NOTE — ED Triage Notes (Signed)
Pt to ER states "I can't breathe, I'm having stomach cramps and I had a bowel movement with blood in it, so here I am".  Pt states SHOB in last 45 minutes, states he is out of medications.  States bowel movement was 35 min PTA.  Pt unable to quantify "blood".

## 2019-08-25 ENCOUNTER — Inpatient Hospital Stay: Payer: Medicare HMO

## 2019-08-25 ENCOUNTER — Encounter: Payer: Self-pay | Admitting: Obstetrics and Gynecology

## 2019-08-25 DIAGNOSIS — I5021 Acute systolic (congestive) heart failure: Secondary | ICD-10-CM

## 2019-08-25 DIAGNOSIS — E785 Hyperlipidemia, unspecified: Secondary | ICD-10-CM

## 2019-08-25 DIAGNOSIS — I480 Paroxysmal atrial fibrillation: Secondary | ICD-10-CM

## 2019-08-25 DIAGNOSIS — K922 Gastrointestinal hemorrhage, unspecified: Secondary | ICD-10-CM

## 2019-08-25 DIAGNOSIS — E0590 Thyrotoxicosis, unspecified without thyrotoxic crisis or storm: Secondary | ICD-10-CM

## 2019-08-25 DIAGNOSIS — F1010 Alcohol abuse, uncomplicated: Secondary | ICD-10-CM

## 2019-08-25 DIAGNOSIS — I10 Essential (primary) hypertension: Secondary | ICD-10-CM

## 2019-08-25 LAB — CBC
HCT: 34.5 % — ABNORMAL LOW (ref 39.0–52.0)
HCT: 34.1 % — ABNORMAL LOW (ref 39.0–52.0)
Hemoglobin: 11.3 g/dL — ABNORMAL LOW (ref 13.0–17.0)
Hemoglobin: 11.3 g/dL — ABNORMAL LOW (ref 13.0–17.0)
MCH: 30.5 pg (ref 26.0–34.0)
MCH: 31 pg (ref 26.0–34.0)
MCHC: 32.8 g/dL (ref 30.0–36.0)
MCHC: 33.1 g/dL (ref 30.0–36.0)
MCV: 93.2 fL (ref 80.0–100.0)
MCV: 93.4 fL (ref 80.0–100.0)
Platelets: 146 10*3/uL — ABNORMAL LOW (ref 150–400)
Platelets: 138 10*3/uL — ABNORMAL LOW (ref 150–400)
RBC: 3.7 MIL/uL — ABNORMAL LOW (ref 4.22–5.81)
RBC: 3.65 MIL/uL — ABNORMAL LOW (ref 4.22–5.81)
RDW: 16.3 % — ABNORMAL HIGH (ref 11.5–15.5)
RDW: 16.2 % — ABNORMAL HIGH (ref 11.5–15.5)
WBC: 5.3 10*3/uL (ref 4.0–10.5)
WBC: 4.9 10*3/uL (ref 4.0–10.5)
nRBC: 0 % (ref 0.0–0.2)
nRBC: 0 % (ref 0.0–0.2)

## 2019-08-25 LAB — COMPREHENSIVE METABOLIC PANEL
ALT: 26 U/L (ref 0–44)
AST: 22 U/L (ref 15–41)
Albumin: 3.6 g/dL (ref 3.5–5.0)
Alkaline Phosphatase: 71 U/L (ref 38–126)
Anion gap: 6 (ref 5–15)
BUN: 11 mg/dL (ref 8–23)
CO2: 26 mmol/L (ref 22–32)
Calcium: 8 mg/dL — ABNORMAL LOW (ref 8.9–10.3)
Chloride: 106 mmol/L (ref 98–111)
Creatinine, Ser: 0.63 mg/dL (ref 0.61–1.24)
GFR calc Af Amer: >60 mL/min (ref >60)
GFR calc non Af Amer: >60 mL/min (ref >60)
Glucose, Bld: 84 mg/dL (ref 70–99)
Potassium: 4 mmol/L (ref 3.5–5.1)
Sodium: 138 mmol/L (ref 135–145)
Total Bilirubin: 1.9 mg/dL — ABNORMAL HIGH (ref 0.3–1.2)
Total Protein: 6.6 g/dL (ref 6.5–8.1)

## 2019-08-25 LAB — ABO/RH: ABO/RH(D): A POS

## 2019-08-25 LAB — SARS CORONAVIRUS 2 (TAT 6-24 HRS): SARS Coronavirus 2: NEGATIVE

## 2019-08-25 LAB — HEMOGLOBIN A1C
Hgb A1c MFr Bld: 6.8 % — ABNORMAL HIGH (ref 4.8–5.6)
Mean Plasma Glucose: 148.46 mg/dL

## 2019-08-25 LAB — HIV ANTIBODY (ROUTINE TESTING W REFLEX): HIV Screen 4th Generation wRfx: NONREACTIVE

## 2019-08-25 LAB — PREPARE RBC (CROSSMATCH)

## 2019-08-25 LAB — APTT: aPTT: 31 seconds (ref 24–36)

## 2019-08-25 LAB — TSH: TSH: 7.018 u[IU]/mL — ABNORMAL HIGH (ref 0.350–4.500)

## 2019-08-25 LAB — PROTIME-INR
INR: 1.2 (ref 0.8–1.2)
Prothrombin Time: 14.7 seconds (ref 11.4–15.2)

## 2019-08-25 MED ORDER — FUROSEMIDE 10 MG/ML IJ SOLN
40.0000 mg | Freq: Once | INTRAMUSCULAR | Status: AC
  Administered 2019-08-25: 17:00:00 40 mg via INTRAVENOUS
  Filled 2019-08-25: qty 4

## 2019-08-25 MED ORDER — IPRATROPIUM-ALBUTEROL 0.5-2.5 (3) MG/3ML IN SOLN
3.0000 mL | Freq: Four times a day (QID) | RESPIRATORY_TRACT | Status: DC
  Administered 2019-08-25 – 2019-08-27 (×7): 3 mL via RESPIRATORY_TRACT
  Filled 2019-08-25 (×8): qty 3

## 2019-08-25 MED ORDER — ACETAMINOPHEN 325 MG PO TABS
650.0000 mg | ORAL_TABLET | Freq: Four times a day (QID) | ORAL | Status: DC | PRN
  Administered 2019-08-25 – 2019-08-27 (×6): 650 mg via ORAL
  Filled 2019-08-25 (×5): qty 2

## 2019-08-25 MED ORDER — PANTOPRAZOLE SODIUM 40 MG IV SOLR
40.0000 mg | Freq: Two times a day (BID) | INTRAVENOUS | Status: DC
  Administered 2019-08-25 – 2019-08-27 (×5): 40 mg via INTRAVENOUS
  Filled 2019-08-25 (×5): qty 40

## 2019-08-25 MED ORDER — SODIUM CHLORIDE 0.9% IV SOLUTION
Freq: Once | INTRAVENOUS | Status: DC
  Filled 2019-08-25: qty 250

## 2019-08-25 NOTE — ED Notes (Signed)
Nurse at bedside with blood administration

## 2019-08-25 NOTE — ED Notes (Signed)
Lab calls with critical value, NA-117

## 2019-08-25 NOTE — ED Notes (Signed)
Pt currently resting in bed at this time. States he is attempting to get some sleep. Lights turned off, call light in reach

## 2019-08-25 NOTE — Consult Note (Addendum)
GI Inpatient Consult Note  Reason for Consult: Hematochezia    Attending Requesting Consult: Dr. Loletha Grayer, MD  History of Present Illness: Eugene Henderson is a 69 y.o. male seen for evaluation of hematochezia at the request of Dr. Leslye Peer. Patient has a PMH of AAA, chronic systolic CHF, Hx of diverticulitis, HTN, hyperthyroidism, Hx of HCV s/p antiviral treatment in [place], NICM, persistent atrial fibrillation on chronic anticoagulation with Eliquis who presented to the ED yesterday afternoon for complaint of hematochezia with associated shortness of breath. He reports he was trying to suppress a cough yesterday and performed a valsalva maneuver and had a spontaneous BM. When he looked in the toilet, he noticed bright red blood mixed with stool. He was very concerned about seeing blood in the toilet and promptly presented to the ED for further evaluation. Upon presentation to the ED, he was found to have a hemoglobin of 11.9 and an INR of 1.1.  Previous hemoglobin from 08/2018 showed hemoglobin 14.9. He has not had any rectal bleeding or BMs since this episode at home. He denies any rectal pain.  ED physician reported maroon-colored stool noted on DRE with small non-bleeding internal hemorrhoid.  He reports he has been out of his Eliquis for the past two weeks and has been awaiting his appointment with his PCP this Tuesday to ask for a refill. He denies any chest pain, dizziness, fatigue, or abdominal pain. He does report sensations of abdominal bloating and a feeling of pressure diffusely across his abdomen. He cannot button up his pants due to the bloating. He apparently received 1 unit of blood overnight for unknown reasons. He had hemoglobin 11.3 this morning on repeat labs. He reports he is feeling well outside of some mild shortness of breath. He has never had GI bleeding before.  He reports a colonoscopy performed 7 years ago in [place], [place] which was reportedly normal.  He denies any known family  history of colon cancer adenomatous polyps.  He reports he does not use NSAIDs.  He denies any nausea, vomiting, dysphagia, odynophagia, early satiety.  He denies any hemoptysis.  He reports he does drink daily, multiple drinks a day.    Last Colonoscopy: 7 years ago in [place], [place] - normal per patient  Last Endoscopy: N/A   Past Medical History:  Past Medical History:  Diagnosis Date  . Arthritis   . Ascending aortic aneurysm ([place])    a. 09/2017 Stable TAA - 5.1cm.  . Asthma   . Chronic systolic CHF (congestive heart failure) (HCC)    a. EF 25-30% by echo in 07/2016 with cath showing no significant CAD b. 01/2017: EF 30-35% with diffuse HK and moderate MR; c. 07/2017 Echo: EF 30-35%, diff hK. Mild MR, mildly dil LA. PASP 34mmHg.  . Diverticulitis   . Diverticulitis of large intestine with perforation without abscess or bleeding 05/13/2017  . Hypertension   . Hyperthyroidism   . NICM (nonischemic cardiomyopathy) ([place])   . Noncompliance   . Persistent atrial fibrillation (HCC)    a. CHA2DS2VASc = 3-->Eliquis (? compliance).    Problem List: Patient Active Problem List   Diagnosis Date Noted  . Lower GI bleed 08/24/2019  . Morbid obesity ([place]) 12/11/2018  . Chronic pain of right knee 12/11/2018  . Advance directive discussed with patient 05/16/2018  . Diabetes mellitus without complication ([place]) 23/53/6144  . 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 COPD (chronic obstructive pulmonary disease) ([place]) 02/03/2017  . Chronic hepatitis C without hepatic coma (  HCC) 12/15/2016  . AAA (abdominal aortic aneurysm) (HCC) 11/29/2016  . Chronic low back pain 11/29/2016  . Alcohol abuse 11/29/2016  . Hyponatremia 11/22/2016  . Chronic systolic heart failure (HCC) 08/19/2016  . Tobacco use 08/19/2016  . Snoring 08/19/2016  . Persistent atrial fibrillation (HCC) 07/28/2016  . Hyperthyroidism 07/28/2016  . Noncompliance with medications 07/28/2016  . Essential hypertension 07/28/2016     Past Surgical History: Past Surgical History:  Procedure Laterality Date  . JOINT REPLACEMENT Right   . RIGHT/LEFT HEART CATH AND CORONARY ANGIOGRAPHY N/A 08/02/2016   Procedure: Right/Left Heart Cath and Coronary Angiography;  Surgeon: Iran Ouch, MD;  Location: [facility] INVASIVE CV LAB;  Service: Cardiovascular;  Laterality: N/A;  . TESTICLE SURGERY     Patient states that he had to have the tube fixed.    Allergies: No Known Allergies  Home Medications: (Not in a hospital admission)  Home medication reconciliation was completed with the patient.   Scheduled Inpatient Medications:   . sodium chloride   Intravenous Once  . atorvastatin  40 mg Oral Daily  . digoxin  125 mcg Oral Daily  . folic acid  1 mg Oral Daily  . furosemide  40 mg Oral Daily  . methimazole  20 mg Oral BID  . metoprolol succinate  25 mg Oral Daily  . multivitamin with minerals  1 tablet Oral Daily  . potassium chloride SA  20 mEq Oral Daily  . spironolactone  25 mg Oral Daily  . thiamine  100 mg Oral Daily   Or  . thiamine  100 mg Intravenous Daily    Continuous Inpatient Infusions:   . sodium chloride 30 mL/hr at 08/25/19 1019  . pantoprozole (PROTONIX) infusion 8 mg/hr (08/25/19 0050)    PRN Inpatient Medications:  acetaminophen, albuterol, LORazepam **OR** LORazepam  Family History: family history includes Diabetes in his brother, father, and mother; Heart attack in his brother; Heart disease in his brother; Kidney failure in his brother.  The patient's family history is negative for inflammatory bowel disorders, GI malignancy, or solid organ transplantation.  Social History:   reports that he has been smoking cigarettes. He has a 6.25 pack-year smoking history. He has never used smokeless tobacco. He reports current alcohol use. He reports that he does not use drugs. The patient denies ETOH, tobacco, or drug use.   Review of Systems: Constitutional: Weight is stable.  Eyes: No changes in  vision. ENT: No oral lesions, sore throat.  GI: see HPI.  Heme/Lymph: No easy bruising.  CV: No chest pain.  GU: No hematuria.  Integumentary: No rashes.  Neuro: No headaches.  Psych: No depression/anxiety.  Endocrine: No heat/cold intolerance.  Allergic/Immunologic: No urticaria.  Resp: No cough, SOB.  Musculoskeletal: No joint swelling.    Physical Examination: BP (!) 144/93 (BP Location: Right Arm)   Pulse 89   Temp 98.9 F (37.2 C) (Oral)   Resp 17   Ht 6\' 1"  (1.854 m)   Wt 117.9 kg   SpO2 97%   BMI 34.30 kg/m  Gen: NAD, alert and oriented x 4 HEENT: PEERLA, EOMI, Neck: supple, no JVD or thyromegaly Chest: CTA bilaterally, no wheezes, crackles, or other adventitious sounds CV: RRR, no m/g/c/r Abd: soft, NT, ND, +BS in all four quadrants; no HSM, guarding, ridigity, or rebound tenderness Ext: no edema, well perfused with 2+ pulses, Skin: no rash or lesions noted Lymph: no LAD  Data: Lab Results  Component Value Date   WBC 4.9 08/25/2019  HGB 11.3 (L) 08/25/2019   HCT 34.1 (L) 08/25/2019   MCV 93.4 08/25/2019   PLT 138 (L) 08/25/2019   Recent Labs  Lab 08/24/19 1355 08/25/19 0329 08/25/19 0448  HGB 11.9* 11.3* 11.3*   Lab Results  Component Value Date   NA 138 08/25/2019   K 4.0 08/25/2019   CL 106 08/25/2019   CO2 26 08/25/2019   BUN 11 08/25/2019   CREATININE 0.63 08/25/2019   Lab Results  Component Value Date   ALT 26 08/25/2019   AST 22 08/25/2019   ALKPHOS 71 08/25/2019   BILITOT 1.9 (H) 08/25/2019   Recent Labs  Lab 08/25/19 0448  APTT 31  INR 1.2   Assessment/Plan:  69 y/o AA male with a PMH of AAA, chronic systolic CHF, Hx of diverticulitis, HTN, hyperthyroidism, 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 Hematochezia  2. Chronic anticoagulation - he has been off Eliquis x 2 weeks 3.  Chronic systolic CHF   -DDx includes anal outlet etiology from internal hemorrhoids versus self-limited diverticular bleed. DDx also includes AVMs, ischemic, polyp, malignancy, etc -One episode of painless hematochezia yesterday and no recurrent episodes. He is hemodynamically stable with no evidence of ongoing bleeding. His hemoglobin has dropped appx 3 grams over the past year with considerations given to IDA, AOCD, nutritional deficiency, etc -Agree with acid suppression -Continue to monitor H&H closely and transfuse as necessary to ensure hgb >7.0 -No indication for urgent colonoscopy at this time as there is no precipitous drop in hemoglobin or evidence of ongoing bleeding -Heart healthy diet -Abd x-ray flat and upright to assess stool burden and rule out air fluid levels in abdomen -He can follow-up as an outpatient for discussion about colonoscopy or can consider colonoscopy on Monday with Dr. Norma Fredrickson prior to resuming Eliquis -Hold Eliquis while in the hospital, but can likely resume upon discharge -Following. Please call us immediately if there is evidence of overt hematochezia or melena.  -Clears starting tomorrow    Thank you for the consult. Please call with questions or concerns.  Mickle Mallory [facility] [phone] (Cell)

## 2019-08-25 NOTE — ED Notes (Signed)
Heads up given, RN ok with transfer.

## 2019-08-25 NOTE — Progress Notes (Signed)
Patient ID: Eugene Henderson, male   DOB: 03-Aug-1950, 69 y.o.   MRN: [id number] [facility] PROGRESS NOTE  Samier Jaco YQM:[id number] DOB: 1950-06-29 DOA: 08/24/2019 PCP: Olevia Perches P, DO  HPI/Subjective: Complaint of blood when wiping on Wednesday.  He has been having maroon bowel movements over the past few days.  Patient this morning did not complaining of shortness of breath but this afternoon had some shortness of breath.  Objective: Vitals:   08/25/19 1345 08/25/19 1457  BP:  (!) 149/104  Pulse: 80 88  Resp:    Temp:  98 F (36.7 C)  SpO2: 98% 100%    Intake/Output Summary (Last 24 hours) at 08/25/2019 1510 Last data filed at 08/25/2019 1232 Gross per 24 hour  Intake 340 ml  Output 700 ml  Net -360 ml   Filed Weights   08/24/19 1345  Weight: 117.9 kg    ROS: Review of Systems  Constitutional: Negative for fever.  Eyes: Negative for blurred vision.  Respiratory: Positive for shortness of breath. Negative for cough.   Cardiovascular: Negative for chest pain.  Gastrointestinal: Positive for blood in stool and diarrhea. Negative for abdominal pain, nausea and vomiting.  Genitourinary: Negative for dysuria.  Musculoskeletal: Negative for joint pain.  Neurological: Negative for headaches.   Exam: Physical Exam  Constitutional: He is oriented to person, place, and time.  HENT:  Nose: No mucosal edema.  Mouth/Throat: No oropharyngeal exudate or posterior oropharyngeal edema.  Eyes: Conjunctivae and lids are normal.  Right eye clouded over  Neck: Carotid bruit is not present.  Cardiovascular: S1 normal and S2 normal. Exam reveals no gallop.  No murmur heard. Respiratory: No respiratory distress. He has decreased breath sounds in the right lower field and the left lower field. He has no wheezes. He has no rhonchi. He has no rales.  GI: Soft. Bowel sounds are normal. There is no abdominal tenderness.  Musculoskeletal:     Right ankle: No swelling.     Left  ankle: No swelling.  Lymphadenopathy:    He has no cervical adenopathy.  Neurological: He is alert and oriented to person, place, and time. No cranial nerve deficit.  Skin: Skin is warm. No rash noted. Nails show no clubbing.  Psychiatric: He has a normal mood and affect.      Data Reviewed: Basic Metabolic Panel: Recent Labs  Lab 08/24/19 1355 08/25/19 0448  NA 138 138  K 4.2 4.0  CL 107 106  CO2 22 26  GLUCOSE 110* 84  BUN 11 11  CREATININE 0.66 0.63  CALCIUM 8.5* 8.0*   Liver Function Tests: Recent Labs  Lab 08/24/19 2246 08/25/19 0448  AST 30 22  ALT 30 26  ALKPHOS 82 71  BILITOT 1.6* 1.9*  PROT 7.4 6.6  ALBUMIN 4.2 3.6   CBC: Recent Labs  Lab 08/24/19 1355 08/25/19 0329 08/25/19 0448  WBC 4.5 5.3 4.9  HGB 11.9* 11.3* 11.3*  HCT 35.2* 34.5* 34.1*  MCV 91.7 93.2 93.4  PLT 155 146* 138*   BNP (last 3 results) Recent Labs    08/24/19 1355  BNP 274.0*     Recent Results (from the past 240 hour(s))  SARS CORONAVIRUS 2 (TAT 6-24 HRS) Nasopharyngeal Nasopharyngeal Swab     Status: None   Collection Time: 08/24/19 10:46 PM   Specimen: Nasopharyngeal Swab  Result Value Ref Range Status   SARS Coronavirus 2 NEGATIVE NEGATIVE Final    Comment: (NOTE) SARS-CoV-2 target nucleic acids are NOT DETECTED. The SARS-CoV-2  RNA is generally detectable in upper and lower respiratory specimens during the acute phase of infection. Negative results do not preclude SARS-CoV-2 infection, do not rule out co-infections with other pathogens, and should not be used as the sole basis for treatment or other patient management decisions. Negative results must be combined with clinical observations, patient history, and epidemiological information. The expected result is Negative. Fact Sheet for Patients: SugarRoll.be Fact Sheet for Healthcare Providers: https://www.woods-mathews.com/ This test is not yet approved or cleared by the  [place] FDA and  has been authorized for detection and/or diagnosis of SARS-CoV-2 by FDA under an Emergency Use Authorization (EUA). This EUA will remain  in effect (meaning this test can be used) for the duration of the COVID-19 declaration under Section 56 4(b)(1) of the Act, 21 U.S.C. section 360bbb-3(b)(1), unless the authorization is terminated or revoked sooner. Performed at [facility], [place] [address] [place], [place] [address]      Studies: DG Chest 2 View  Result Date: 08/24/2019 CLINICAL DATA:  Shortness of breath. EXAM: CHEST - 2 VIEW COMPARISON:  March 02, 2018. FINDINGS: Mild cardiomegaly is noted. No pneumothorax or pleural effusion is noted. Both lungs are clear. The visualized skeletal structures are unremarkable. IMPRESSION: No active cardiopulmonary disease. Electronically Signed   By: Marijo Conception M.D.   On: 08/24/2019 15:02   DG Abd 2 Views  Result Date: 08/25/2019 CLINICAL DATA:  Hematochezia. EXAM: ABDOMEN - 2 VIEW COMPARISON:  None. FINDINGS: The bowel gas pattern is normal. There is no evidence of free air. No radio-opaque calculi or other significant radiographic abnormality is seen. IMPRESSION: No evidence of bowel obstruction or ileus. Electronically Signed   By: Marijo Conception M.D.   On: 08/25/2019 11:39    Scheduled Meds: . sodium chloride   Intravenous Once  . atorvastatin  40 mg Oral Daily  . digoxin  125 mcg Oral Daily  . folic acid  1 mg Oral Daily  . furosemide  40 mg Intravenous Once  . furosemide  40 mg Oral Daily  . ipratropium-albuterol  3 mL Nebulization Q6H  . metoprolol succinate  25 mg Oral Daily  . multivitamin with minerals  1 tablet Oral Daily  . pantoprazole (PROTONIX) IV  40 mg Intravenous Q12H  . potassium chloride SA  20 mEq Oral Daily  . spironolactone  25 mg Oral Daily  . thiamine  100 mg Oral Daily   Or  . thiamine  100 mg Intravenous Daily   Continuous Infusions:  Assessment/Plan:  1. GI bleed with  maroon stools.  Case discussed with gastroenterology.  They plan on doing a colonoscopy.  Since it is not urgent, I will not be done over the weekend and set up for Monday.  I was informed that the patient was given a unit of blood overnight. 2. Alcohol abuse.  No signs of withdrawal. 3. Acute systolic congestive heart failure.  Patient received a unit of blood and was on IV fluids and IV Protonix.  We will get rid of IV fluids and IV Protonix and give an extra dose of Lasix this afternoon.  Patient on spironolactone, Lasix and metoprolol 4. Paroxysmal atrial fibrillation on metoprolol digoxin.  Patient has not had Eliquis in 2 weeks. 5. Essential hypertension continue usual medications 6. Hyperthyroidism.  TSH actually elevated hold methimazole 7. Hyperlipidemia unspecified on Lipitor  Code Status:     Code Status Orders  (From admission, onward)  Start     Ordered   08/24/19 2319  Full code  Continuous     08/24/19 2318        Code Status History    Date Active Date Inactive Code Status Order ID Comments User Context   07/14/2017 0933 07/16/2017 1620 Full Code [id number]  Arnaldo Natal, MD Inpatient   05/13/2017 2209 05/16/2017 1847 Full Code [id number]  Henrene Dodge, MD ED   11/22/2016 2110 11/26/2016 1344 Full Code [id number]  Ihor Austin, MD ED   07/28/2016 1355 08/04/2016 2151 Full Code [id number]  Houston Siren, MD ED   Advance Care Planning Activity     Family Communication: Spoke with sister on the phone Disposition Plan: Potential disposition after colonoscopy on Monday  Consultants:  Gastroenterology  Time spent: 28 minutes  Derl Abalos [employer]

## 2019-08-25 NOTE — ED Notes (Signed)
Pt with xray 

## 2019-08-26 LAB — BASIC METABOLIC PANEL
Anion gap: 10 (ref 5–15)
BUN: 12 mg/dL (ref 8–23)
CO2: 27 mmol/L (ref 22–32)
Calcium: 8.7 mg/dL — ABNORMAL LOW (ref 8.9–10.3)
Chloride: 101 mmol/L (ref 98–111)
Creatinine, Ser: 0.78 mg/dL (ref 0.61–1.24)
GFR calc Af Amer: >60 mL/min (ref >60)
GFR calc non Af Amer: >60 mL/min (ref >60)
Glucose, Bld: 137 mg/dL — ABNORMAL HIGH (ref 70–99)
Potassium: 3.4 mmol/L — ABNORMAL LOW (ref 3.5–5.1)
Sodium: 138 mmol/L (ref 135–145)

## 2019-08-26 LAB — CBC
HCT: 37.5 % — ABNORMAL LOW (ref 39.0–52.0)
Hemoglobin: 12.6 g/dL — ABNORMAL LOW (ref 13.0–17.0)
MCH: 30.7 pg (ref 26.0–34.0)
MCHC: 33.6 g/dL (ref 30.0–36.0)
MCV: 91.5 fL (ref 80.0–100.0)
Platelets: 148 10*3/uL — ABNORMAL LOW (ref 150–400)
RBC: 4.1 MIL/uL — ABNORMAL LOW (ref 4.22–5.81)
RDW: 16 % — ABNORMAL HIGH (ref 11.5–15.5)
WBC: 4.8 10*3/uL (ref 4.0–10.5)
nRBC: 0 % (ref 0.0–0.2)

## 2019-08-26 LAB — TYPE AND SCREEN
ABO/RH(D): A POS
Antibody Screen: NEGATIVE
Unit division: 0
Unit division: 0

## 2019-08-26 LAB — BPAM RBC
Blood Product Expiration Date: 202104192359
Blood Product Expiration Date: 202104192359
ISSUE DATE / TIME: 202103200116
Unit Type and Rh: 6200
Unit Type and Rh: 6200

## 2019-08-26 MED ORDER — AMLODIPINE BESYLATE 5 MG PO TABS
5.0000 mg | ORAL_TABLET | Freq: Every day | ORAL | Status: DC
  Administered 2019-08-26: 18:00:00 5 mg via ORAL
  Filled 2019-08-26: qty 1

## 2019-08-26 MED ORDER — POLYETHYLENE GLYCOL 3350 17 GM/SCOOP PO POWD
1.0000 | Freq: Once | ORAL | Status: AC
  Administered 2019-08-26: 18:00:00 255 g via ORAL
  Filled 2019-08-26: qty 255

## 2019-08-26 MED ORDER — IRBESARTAN 150 MG PO TABS
150.0000 mg | ORAL_TABLET | Freq: Every day | ORAL | Status: DC
  Administered 2019-08-26 – 2019-08-27 (×2): 150 mg via ORAL
  Filled 2019-08-26 (×2): qty 1

## 2019-08-26 MED ORDER — BISACODYL 5 MG PO TBEC: 10.0000 mg | DELAYED_RELEASE_TABLET | Freq: Every day | ORAL | Status: DC | PRN

## 2019-08-26 MED ORDER — METOPROLOL SUCCINATE ER 50 MG PO TB24
50.0000 mg | ORAL_TABLET | Freq: Every day | ORAL | Status: DC
  Administered 2019-08-26 – 2019-08-27 (×2): 50 mg via ORAL
  Filled 2019-08-26 (×2): qty 1

## 2019-08-26 NOTE — H&P (View-Only) (Signed)
GI Inpatient Follow-up Note  Subjective:  Patient seen in follow-up for hematochezia. No acute events overnight. Hemoglobin 12.6 this morning from 11.3 yesterday. Per patient, 2 BMs since yesterday and reportedly had maroon stool. He reports his abdominal distention is improved. He denies any nausea, vomiting, dysphagia, abdominal pain. He reports his left antecubital region is bruised from the blood transfusion.   Scheduled Inpatient Medications:  . sodium chloride   Intravenous Once  . atorvastatin  40 mg Oral Daily  . digoxin  125 mcg Oral Daily  . folic acid  1 mg Oral Daily  . furosemide  40 mg Oral Daily  . ipratropium-albuterol  3 mL Nebulization Q6H  . irbesartan  150 mg Oral Daily  . metoprolol succinate  50 mg Oral Daily  . multivitamin with minerals  1 tablet Oral Daily  . pantoprazole (PROTONIX) IV  40 mg Intravenous Q12H  . potassium chloride SA  20 mEq Oral Daily  . spironolactone  25 mg Oral Daily  . thiamine  100 mg Oral Daily   Or  . thiamine  100 mg Intravenous Daily    Continuous Inpatient Infusions:    PRN Inpatient Medications:  acetaminophen, LORazepam **OR** LORazepam  Review of Systems: Constitutional: Weight is stable.  Eyes: No changes in vision. ENT: No oral lesions, sore throat.  GI: see HPI.  Heme/Lymph: No easy bruising.  CV: No chest pain.  GU: No hematuria.  Integumentary: No rashes.  Neuro: No headaches.  Psych: No depression/anxiety.  Endocrine: No heat/cold intolerance.  Allergic/Immunologic: No urticaria.  Resp: No cough, SOB.  Musculoskeletal: No joint swelling.    Physical Examination: BP (!) 140/104 (BP Location: Right Arm) Comment: Reported to RN Nurse.  Pulse 97   Temp 98.5 F (36.9 C)   Resp 20   Ht 6\' 1"  (1.854 m)   Wt 117.9 kg   SpO2 100%   BMI 34.30 kg/m  Gen: NAD, alert and oriented x 4 HEENT: PEERLA, EOMI, Neck: supple, no JVD or thyromegaly Chest: CTA bilaterally, no wheezes, crackles, or other adventitious  sounds CV: RRR, no m/g/c/r Abd: soft, NT, ND, +BS in all four quadrants; no HSM, guarding, ridigity, or rebound tenderness Ext: no edema, well perfused with 2+ pulses, Skin: no rash or lesions noted Lymph: no LAD  Data: Lab Results  Component Value Date   WBC 4.8 08/26/2019   HGB 12.6 (L) 08/26/2019   HCT 37.5 (L) 08/26/2019   MCV 91.5 08/26/2019   PLT 148 (L) 08/26/2019   Recent Labs  Lab 08/25/19 0329 08/25/19 0448 08/26/19 0714  HGB 11.3* 11.3* 12.6*   Lab Results  Component Value Date   NA 138 08/26/2019   K 3.4 (L) 08/26/2019   CL 101 08/26/2019   CO2 27 08/26/2019   BUN 12 08/26/2019   CREATININE 0.78 08/26/2019   Lab Results  Component Value Date   ALT 26 08/25/2019   AST 22 08/25/2019   ALKPHOS 71 08/25/2019   BILITOT 1.9 (H) 08/25/2019   Recent Labs  Lab 08/25/19 0448  APTT 31  INR 1.2   Assessment/Plan:  69 y/o AA male with a PMH of AAA, chronic systolic CHF, Hx of diverticulitis, HTN, hyperthyroidism, 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 Hematochezia  2. Chronic anticoagulation - he has been off Eliquis x 2 weeks 3. Chronic systolic CHF  -No  evidence of overt gastrointestinal blood loss. H&H stable at 12.6. -Continue to monitor H&H. Transfuse as necessary for Hgb >7.0. -Continue IV acid suppression -Advise colonoscopy for tomorrow with Dr. Norma Fredrickson. We discussed procedure details and indications in room today.  -See procedure note for findings and further recommendations -Clear liquids today. NPO after midnight. -Orders for bowel prep are in -Continue to hold Eliquis  -I reviewed the risks (including bleeding, perforation, infection, anesthesia complications, cardiac/respiratory complications), benefits and alternatives of colonoscopy. Patient consents to proceed.    Please call with  questions or concerns.   Jacob Moores, PA-C [facility] Gastroenterology [phone] (Cell)

## 2019-08-26 NOTE — Progress Notes (Signed)
GI Inpatient Follow-up Note  Subjective:  Patient seen in follow-up for hematochezia. No acute events overnight. Hemoglobin 12.6 this morning from 11.3 yesterday. Per patient, 2 BMs since yesterday and reportedly had maroon stool. He reports his abdominal distention is improved. He denies any nausea, vomiting, dysphagia, abdominal pain. He reports his left antecubital region is bruised from the blood transfusion.   Scheduled Inpatient Medications:  . sodium chloride   Intravenous Once  . atorvastatin  40 mg Oral Daily  . digoxin  125 mcg Oral Daily  . folic acid  1 mg Oral Daily  . furosemide  40 mg Oral Daily  . ipratropium-albuterol  3 mL Nebulization Q6H  . irbesartan  150 mg Oral Daily  . metoprolol succinate  50 mg Oral Daily  . multivitamin with minerals  1 tablet Oral Daily  . pantoprazole (PROTONIX) IV  40 mg Intravenous Q12H  . potassium chloride SA  20 mEq Oral Daily  . spironolactone  25 mg Oral Daily  . thiamine  100 mg Oral Daily   Or  . thiamine  100 mg Intravenous Daily    Continuous Inpatient Infusions:    PRN Inpatient Medications:  acetaminophen, LORazepam **OR** LORazepam  Review of Systems: Constitutional: Weight is stable.  Eyes: No changes in vision. ENT: No oral lesions, sore throat.  GI: see HPI.  Heme/Lymph: No easy bruising.  CV: No chest pain.  GU: No hematuria.  Integumentary: No rashes.  Neuro: No headaches.  Psych: No depression/anxiety.  Endocrine: No heat/cold intolerance.  Allergic/Immunologic: No urticaria.  Resp: No cough, SOB.  Musculoskeletal: No joint swelling.    Physical Examination: BP (!) 140/104 (BP Location: Right Arm) Comment: Reported to RN Nurse.  Pulse 97   Temp 98.5 F (36.9 C)   Resp 20   Ht 6' 1" (1.854 m)   Wt 117.9 kg   SpO2 100%   BMI 34.30 kg/m  Gen: NAD, alert and oriented x 4 HEENT: PEERLA, EOMI, Neck: supple, no JVD or thyromegaly Chest: CTA bilaterally, no wheezes, crackles, or other adventitious  sounds CV: RRR, no m/g/c/r Abd: soft, NT, ND, +BS in all four quadrants; no HSM, guarding, ridigity, or rebound tenderness Ext: no edema, well perfused with 2+ pulses, Skin: no rash or lesions noted Lymph: no LAD  Data: Lab Results  Component Value Date   WBC 4.8 08/26/2019   HGB 12.6 (L) 08/26/2019   HCT 37.5 (L) 08/26/2019   MCV 91.5 08/26/2019   PLT 148 (L) 08/26/2019   Recent Labs  Lab 08/25/19 0329 08/25/19 0448 08/26/19 0714  HGB 11.3* 11.3* 12.6*   Lab Results  Component Value Date   NA 138 08/26/2019   K 3.4 (L) 08/26/2019   CL 101 08/26/2019   CO2 27 08/26/2019   BUN 12 08/26/2019   CREATININE 0.78 08/26/2019   Lab Results  Component Value Date   ALT 26 08/25/2019   AST 22 08/25/2019   ALKPHOS 71 08/25/2019   BILITOT 1.9 (H) 08/25/2019   Recent Labs  Lab 08/25/19 0448  APTT 31  INR 1.2   Assessment/Plan:  69 y/o AA male with a PMH of AAA, chronic systolic CHF, Hx of diverticulitis, HTN, hyperthyroidism, 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 Hematochezia  2. Chronic anticoagulation - he has been off Eliquis x 2 weeks 3. Chronic systolic CHF  -No   evidence of overt gastrointestinal blood loss. H&H stable at 12.6. -Continue to monitor H&H. Transfuse as necessary for Hgb >7.0. -Continue IV acid suppression -Advise colonoscopy for tomorrow with Dr. Norma Fredrickson. We discussed procedure details and indications in room today.  -See procedure note for findings and further recommendations -Clear liquids today. NPO after midnight. -Orders for bowel prep are in -Continue to hold Eliquis  -I reviewed the risks (including bleeding, perforation, infection, anesthesia complications, cardiac/respiratory complications), benefits and alternatives of colonoscopy. Patient consents to proceed.    Please call with  questions or concerns.   Jacob Moores, PA-C [facility] Gastroenterology [phone] (Cell)

## 2019-08-26 NOTE — Progress Notes (Signed)
Patient ID: Eugene Henderson, male   DOB: [birth year], 69 y.o.   MRN: [id number] [facility] PROGRESS NOTE  Dixie Jafri OAC:[id number] DOB: 1951-04-11 DOA: 08/24/2019 PCP: Valerie Roys, DO  HPI/Subjective: Patient states he is breathing better than yesterday afternoon.  He also had a brown bowel movement this morning but had a maroon 1 last night.  Objective: Vitals:   08/26/19 0440 08/26/19 1235  BP: (!) 140/104 (!) 143/100  Pulse: 97 71  Resp: 20 18  Temp: 98.5 F (36.9 C) 98 F (36.7 C)  SpO2: 100% 100%    Intake/Output Summary (Last 24 hours) at 08/26/2019 1601 Last data filed at 08/26/2019 1458 Gross per 24 hour  Intake 916.04 ml  Output 4300 ml  Net -3383.96 ml   Filed Weights   08/24/19 1345  Weight: 117.9 kg    ROS: Review of Systems  Constitutional: Negative for fever.  Eyes: Negative for blurred vision.  Respiratory: Positive for shortness of breath. Negative for cough.   Cardiovascular: Negative for chest pain.  Gastrointestinal: Positive for blood in stool. Negative for abdominal pain, diarrhea, nausea and vomiting.  Genitourinary: Negative for dysuria.  Musculoskeletal: Negative for joint pain.  Neurological: Negative for headaches.   Exam: Physical Exam  Constitutional: He is oriented to person, place, and time.  HENT:  Nose: No mucosal edema.  Mouth/Throat: No oropharyngeal exudate or posterior oropharyngeal edema.  Eyes: Conjunctivae and lids are normal.  Right eye clouded over  Neck: Carotid bruit is not present.  Cardiovascular: S1 normal and S2 normal. Exam reveals no gallop.  No murmur heard. Respiratory: No respiratory distress. He has decreased breath sounds in the right lower field and the left lower field. He has no wheezes. He has no rhonchi. He has no rales.  GI: Soft. Bowel sounds are normal. There is no abdominal tenderness.  Musculoskeletal:     Right ankle: No swelling.     Left ankle: No swelling.  Lymphadenopathy:    He  has no cervical adenopathy.  Neurological: He is alert and oriented to person, place, and time. No cranial nerve deficit.  Skin: Skin is warm. No rash noted. Nails show no clubbing.  Psychiatric: He has a normal mood and affect.      Data Reviewed: Basic Metabolic Panel: Recent Labs  Lab 08/24/19 1355 08/25/19 0448 08/26/19 0714  NA 138 138 138  K 4.2 4.0 3.4*  CL 107 106 101  CO2 22 26 27   GLUCOSE 110* 84 137*  BUN 11 11 12   CREATININE 0.66 0.63 0.78  CALCIUM 8.5* 8.0* 8.7*   Liver Function Tests: Recent Labs  Lab 08/24/19 2246 08/25/19 0448  AST 30 22  ALT 30 26  ALKPHOS 82 71  BILITOT 1.6* 1.9*  PROT 7.4 6.6  ALBUMIN 4.2 3.6   CBC: Recent Labs  Lab 08/24/19 1355 08/25/19 0329 08/25/19 0448 08/26/19 0714  WBC 4.5 5.3 4.9 4.8  HGB 11.9* 11.3* 11.3* 12.6*  HCT 35.2* 34.5* 34.1* 37.5*  MCV 91.7 93.2 93.4 91.5  PLT 155 146* 138* 148*   BNP (last 3 results) Recent Labs    08/24/19 1355  BNP 274.0*     Recent Results (from the past 240 hour(s))  SARS CORONAVIRUS 2 (TAT 6-24 HRS) Nasopharyngeal Nasopharyngeal Swab     Status: None   Collection Time: 08/24/19 10:46 PM   Specimen: Nasopharyngeal Swab  Result Value Ref Range Status   SARS Coronavirus 2 NEGATIVE NEGATIVE Final    Comment: (NOTE) SARS-CoV-2 target  nucleic acids are NOT DETECTED. The SARS-CoV-2 RNA is generally detectable in upper and lower respiratory specimens during the acute phase of infection. Negative results do not preclude SARS-CoV-2 infection, do not rule out co-infections with other pathogens, and should not be used as the sole basis for treatment or other patient management decisions. Negative results must be combined with clinical observations, patient history, and epidemiological information. The expected result is Negative. Fact Sheet for Patients: HairSlick.no Fact Sheet for Healthcare  Providers: quierodirigir.com This test is not yet approved or cleared by the [place] FDA and  has been authorized for detection and/or diagnosis of SARS-CoV-2 by FDA under an Emergency Use Authorization (EUA). This EUA will remain  in effect (meaning this test can be used) for the duration of the COVID-19 declaration under Section 56 4(b)(1) of the Act, 21 U.S.C. section 360bbb-3(b)(1), unless the authorization is terminated or revoked sooner. Performed at [facility], [address] [place], [place] [address]      Studies: DG Abd 2 Views  Result Date: 08/25/2019 CLINICAL DATA:  Hematochezia. EXAM: ABDOMEN - 2 VIEW COMPARISON:  None. FINDINGS: The bowel gas pattern is normal. There is no evidence of free air. No radio-opaque calculi or other significant radiographic abnormality is seen. IMPRESSION: No evidence of bowel obstruction or ileus. Electronically Signed   By: Lupita Raider M.D.   On: 08/25/2019 11:39    Scheduled Meds: . sodium chloride   Intravenous Once  . atorvastatin  40 mg Oral Daily  . digoxin  125 mcg Oral Daily  . folic acid  1 mg Oral Daily  . furosemide  40 mg Oral Daily  . ipratropium-albuterol  3 mL Nebulization Q6H  . irbesartan  150 mg Oral Daily  . metoprolol succinate  50 mg Oral Daily  . multivitamin with minerals  1 tablet Oral Daily  . pantoprazole (PROTONIX) IV  40 mg Intravenous Q12H  . polyethylene glycol powder  1 Container Oral Once  . potassium chloride SA  20 mEq Oral Daily  . spironolactone  25 mg Oral Daily  . thiamine  100 mg Oral Daily   Or  . thiamine  100 mg Intravenous Daily   Continuous Infusions:  Assessment/Plan:  1. GI bleed with maroon stools.  Patient was actually given a unit of blood in the emergency room prior to me seeing him and this is why hemoglobin is higher today.  GI to do a colonoscopy tomorrow.  2. Accelerated hypertension.  Blood pressure higher today. I added irbesartan.   This afternoon I added Norvasc. 3. Alcohol abuse.  No signs of withdrawal. 4. Acute systolic congestive heart failure.  I got rid of the fluids yesterday and Protonix drip.  Patient also got a unit of blood.  I gave IV Lasix yesterday and back on oral Lasix today. 5. Paroxysmal atrial fibrillation on metoprolol digoxin.  Patient has not had Eliquis in 2 weeks. 6. Hyperthyroidism.  TSH actually elevated hold methimazole 7. Hyperlipidemia unspecified on Lipitor 8. History of hepatitis C  Code Status:     Code Status Orders  (From admission, onward)         Start     Ordered   08/24/19 2319  Full code  Continuous     08/24/19 2318        Code Status History    Date Active Date Inactive Code Status Order ID Comments User Context   07/14/2017 0933 07/16/2017 1620 Full Code [id number]  Sheryle Hail,  Kelton Pillar, MD Inpatient   05/13/2017 2209 05/16/2017 1847 Full Code [id number]  Henrene Dodge, MD ED   11/22/2016 2110 11/26/2016 1344 Full Code [id number]  Ihor Austin, MD ED   07/28/2016 1355 08/04/2016 2151 Full Code [id number]  Houston Siren, MD ED   Advance Care Planning Activity     Family Communication: Spoke with daughter Toniann Fail on the phone Disposition Plan: Potential disposition after colonoscopy on Monday depending on results.  Consultants:  Gastroenterology  Time spent: 26 minutes  Lynann Demetrius [employer]

## 2019-08-27 ENCOUNTER — Encounter
Admission: EM | Disposition: A | Discharge status: Home / Self Care | Payer: Self-pay | Source: Home / Self Care | Attending: Internal Medicine

## 2019-08-27 ENCOUNTER — Inpatient Hospital Stay: Payer: Medicare HMO | Admitting: Registered Nurse

## 2019-08-27 HISTORY — PX: COLONOSCOPY: SHX5424

## 2019-08-27 LAB — CBC
HCT: 37.9 % — ABNORMAL LOW (ref 39.0–52.0)
Hemoglobin: 12.7 g/dL — ABNORMAL LOW (ref 13.0–17.0)
MCH: 30.8 pg (ref 26.0–34.0)
MCHC: 33.5 g/dL (ref 30.0–36.0)
MCV: 92 fL (ref 80.0–100.0)
Platelets: 149 10*3/uL — ABNORMAL LOW (ref 150–400)
RBC: 4.12 MIL/uL — ABNORMAL LOW (ref 4.22–5.81)
RDW: 16.1 % — ABNORMAL HIGH (ref 11.5–15.5)
WBC: 4.8 10*3/uL (ref 4.0–10.5)
nRBC: 0 % (ref 0.0–0.2)

## 2019-08-27 LAB — BASIC METABOLIC PANEL
Anion gap: 7 (ref 5–15)
BUN: 7 mg/dL — ABNORMAL LOW (ref 8–23)
CO2: 26 mmol/L (ref 22–32)
Calcium: 8.8 mg/dL — ABNORMAL LOW (ref 8.9–10.3)
Chloride: 106 mmol/L (ref 98–111)
Creatinine, Ser: 0.83 mg/dL (ref 0.61–1.24)
GFR calc Af Amer: >60 mL/min (ref >60)
GFR calc non Af Amer: >60 mL/min (ref >60)
Glucose, Bld: 104 mg/dL — ABNORMAL HIGH (ref 70–99)
Potassium: 3.6 mmol/L (ref 3.5–5.1)
Sodium: 139 mmol/L (ref 135–145)

## 2019-08-27 LAB — SURGICAL PATHOLOGY

## 2019-08-27 LAB — PREPARE RBC (CROSSMATCH)

## 2019-08-27 LAB — URIC ACID: Uric Acid, Serum: 7 mg/dL (ref 3.7–8.6)

## 2019-08-27 SURGERY — COLONOSCOPY
Anesthesia: General

## 2019-08-27 MED ORDER — HYDROCORTISONE ACETATE 25 MG RE SUPP: RECTAL | 1 refills | Status: DC

## 2019-08-27 MED ORDER — PROPOFOL 10 MG/ML IV BOLUS
INTRAVENOUS | Status: DC | PRN
  Administered 2019-08-27: 40 mg via INTRAVENOUS

## 2019-08-27 MED ORDER — PROPOFOL 500 MG/50ML IV EMUL
INTRAVENOUS | Status: DC | PRN
  Administered 2019-08-27: 150 ug/kg/min via INTRAVENOUS

## 2019-08-27 MED ORDER — SODIUM CHLORIDE 0.9 % IV SOLN
INTRAVENOUS | Status: DC
  Administered 2019-08-27: 1000 mL via INTRAVENOUS

## 2019-08-27 MED ORDER — IRBESARTAN 150 MG PO TABS: 150.0000 mg | ORAL_TABLET | Freq: Every day | ORAL | 0 refills | Status: DC

## 2019-08-27 MED ORDER — THIAMINE HCL 100 MG PO TABS: 100.0000 mg | ORAL_TABLET | Freq: Every day | ORAL | 0 refills | Status: DC

## 2019-08-27 MED ORDER — METOPROLOL SUCCINATE ER 50 MG PO TB24: 50.0000 mg | ORAL_TABLET | Freq: Every day | ORAL | 0 refills | Status: DC

## 2019-08-27 MED ORDER — FOLIC ACID 1 MG PO TABS: 1.0000 mg | ORAL_TABLET | Freq: Every day | ORAL | 0 refills | Status: DC

## 2019-08-27 NOTE — Progress Notes (Signed)
[name]  A and O x 4. VSS. Pt tolerating diet well. No complaints of pain or nausea. IV removed intact, prescriptions given. Pt voiced understanding of discharge instructions with no further questions. Pt discharged via wheelchair with NT.   Allergies as of 08/27/2019   No Known Allergies     Medication List    STOP taking these medications   Eliquis 5 MG Tabs tablet Generic drug: apixaban   methimazole 10 MG tablet Commonly known as: TAPAZOLE   potassium chloride SA 20 MEQ tablet Commonly known as: KLOR-CON     TAKE these medications   acetaminophen 500 MG tablet Commonly known as: TYLENOL Take 500-1,000 mg by mouth every 6 (six) hours as needed for mild pain, fever or headache.   albuterol 108 (90 Base) MCG/ACT inhaler Commonly known as: VENTOLIN HFA Inhale 2 puffs into the lungs every 6 (six) hours as needed for wheezing or shortness of breath.   atorvastatin 40 MG tablet Commonly known as: LIPITOR Take 1 tablet (40 mg total) by mouth daily.   budesonide-formoterol 160-4.5 MCG/ACT inhaler Commonly known as: SYMBICORT Inhale 2 puffs into the lungs 2 (two) times daily.   digoxin 0.125 MG tablet Commonly known as: LANOXIN TAKE 1 TABLET BY MOUTH DAILY   folic acid 1 MG tablet Commonly known as: FOLVITE Take 1 tablet (1 mg total) by mouth daily. Start taking on: August 28, 2019   furosemide 40 MG tablet Commonly known as: LASIX Take 1 tablet (40 mg total) by mouth daily.   hydrocortisone 25 MG suppository Commonly known as: ANUSOL-HC One suppository twice a day as needed for hemmorhoids, (okay to substitute generic)   irbesartan 150 MG tablet Commonly known as: AVAPRO Take 1 tablet (150 mg total) by mouth daily. Start taking on: August 28, 2019   metFORMIN 500 MG 24 hr tablet Commonly known as: Glucophage XR Take 1 tablet (500 mg total) by mouth daily with breakfast.   metoprolol succinate 50 MG 24 hr tablet Commonly known as: TOPROL-XL Take 1 tablet  (50 mg total) by mouth daily. Take with or immediately following a meal. Start taking on: August 28, 2019 What changed:   medication strength  how much to take  additional instructions   spironolactone 25 MG tablet Commonly known as: ALDACTONE Take 1 tablet (25 mg total) by mouth daily.   thiamine 100 MG tablet Take 1 tablet (100 mg total) by mouth daily. Start taking on: August 28, 2019       Vitals:   08/27/19 1350 08/27/19 1517  BP:  109/81  Pulse:  84  Resp:  16  Temp:  98 F (36.7 C)  SpO2: 99% 94%    Eugene Henderson

## 2019-08-27 NOTE — Interval H&P Note (Signed)
History and Physical Interval Note:  08/27/2019 8:58 AM  [name]  has presented today for surgery, with the diagnosis of Hematochezia.  The various methods of treatment have been discussed with the patient and family. After consideration of risks, benefits and other options for treatment, the patient has consented to  Procedure(s): COLONOSCOPY (N/A) as a surgical intervention.  The patient's history has been reviewed, patient examined, no change in status, stable for surgery.  I have reviewed the patient's chart and labs.  Questions were answered to the patient's satisfaction.     [place], [place]elford

## 2019-08-27 NOTE — Transfer of Care (Signed)
Immediate Anesthesia Transfer of Care Note  Patient: Eugene Henderson  Procedure(s) Performed: COLONOSCOPY (N/A )  Patient Location: Endoscopy Unit  Anesthesia Type:General  Level of Consciousness: awake, alert  and oriented  Airway & Oxygen Therapy: Patient Spontanous Breathing and Patient connected to face mask oxygen  Post-op Assessment: Report given to RN and Post -op Vital signs reviewed and stable  Post vital signs: Reviewed and stable  Last Vitals:  Vitals Value Taken Time  BP    Temp    Pulse    Resp    SpO2      Last Pain:  Vitals:   08/27/19 0901  TempSrc: Temporal  PainSc:       Patients Stated Pain Goal: 0 (08/27/19 0542)  Complications: No apparent anesthesia complications

## 2019-08-27 NOTE — Discharge Instructions (Signed)
Lower Gastrointestinal Bleeding  Lower gastrointestinal (GI) bleeding is the result of bleeding from the colon, rectum, or anal area. The colon is the last part of the digestive tract, where stool, also called feces, is formed. If you have lower GI bleeding, you may see blood in or on your stool. It may be bright red. Lower GI bleeding often stops without treatment. Continued or heavy bleeding needs emergency treatment at the hospital. What are the causes? Lower GI bleeding may be caused by:  A condition that causes pouches to form in the colon over time (diverticulosis).  Swelling and irritation (inflammation) in areas with diverticulosis (diverticulitis).  Inflammation of the colon (inflammatory bowel disease).  Swollen veins in the rectum (hemorrhoids).  Painful tears in the anus (anal fissures), often caused by passing hard stools.  Cancer of the colon or rectum.  Noncancerous growths (polyps) of the colon or rectum.  A bleeding disorder that impairs the formation of blood clots and causes easy bleeding (coagulopathy).  An abnormal weakening of a blood vessel where an artery and a vein come together (arteriovenous malformation). What increases the risk? You are more likely to develop this condition if:  You are older than 69 years of age.  You take aspirin or NSAIDs on a regular basis.  You take anticoagulant or antiplatelet drugs.  You have a history of high-dose X-ray treatment (radiation therapy) of the colon.  You recently had a colon polyp removed. What are the signs or symptoms? Symptoms of this condition include:  Bright red blood or blood clots coming from your rectum.  Bloody stools.  Black or maroon-colored stools.  Pain or cramping in the abdomen.  Weakness or dizziness.  Racing heartbeat. How is this diagnosed? This condition may be diagnosed based on:  Your symptoms and medical history.  A physical exam. During the exam, your health care  provider will check for signs of blood loss, such as low blood pressure and a rapid pulse.  Tests, such as: ? Flexible sigmoidoscopy. In this procedure, a flexible tube with a camera on the end is used to examine your anus and the first part of your colon to look for the source of bleeding. ? Colonoscopy. This is similar to a flexible sigmoidoscopy, but the camera can extend all the way to the uppermost part of your colon. ? Blood tests to measure your red blood cell count and to check for coagulopathy. ? An imaging study of your colon to look for a bleeding site. In some cases, you may have X-rays taken after a dye or radioactive substance is injected into your bloodstream (angiogram). How is this treated? Treatment for this condition depends on the cause of the bleeding. Heavy or persistent bleeding is treated at the hospital. Treatment may include:  Getting fluids through an IV tube inserted into one of your veins.  Getting blood through an IV tube (blood transfusion).  Stopping bleeding through high-heat coagulation, injections of certain medicines, or applying surgical clips. This can all be done during a colonoscopy.  Having a procedure that involves first doing an angiogram and then blocking blood flow to the bleeding site (embolization).  Stopping some of your regular medicines for a certain amount of time.  Having surgery to remove part of the colon. This may be needed if bleeding is severe and does not respond to other treatment. Follow these instructions at home:  Take over-the-counter and prescription medicines only as told by your health care provider. You may need to   avoid aspirin, NSAIDs, or other medicines that increase bleeding.  Eat foods that are high in fiber. This will help keep your stools soft. These foods include whole grains, legumes, fruits, and vegetables. Eating 1-3 prunes each day works well for many people.  Drink enough fluid to keep your urine clear or pale  yellow.  Keep all follow-up visits as told by your health care provider. This is important. Contact a health care provider if:  Your symptoms do not improve. Get help right away if:  Your bleeding increases.  You feel light-headed or you faint.  You feel weak.  You have severe cramps in your back or abdomen.  You pass large blood clots in your stool.  Your symptoms get worse. This information is not intended to replace advice given to you by your health care provider. Make sure you discuss any questions you have with your health care provider. Document Revised: 09/15/2018 Document Reviewed: 10/09/2015 Elsevier Patient Education  2020 [employer]  

## 2019-08-27 NOTE — Anesthesia Postprocedure Evaluation (Signed)
Anesthesia Post Note  Patient: Eugene Henderson  Procedure(s) Performed: COLONOSCOPY (N/A )  Patient location during evaluation: Endoscopy Anesthesia Type: General Level of consciousness: awake and alert Pain management: pain level controlled Vital Signs Assessment: post-procedure vital signs reviewed and stable Respiratory status: spontaneous breathing, nonlabored ventilation, respiratory function stable and patient connected to nasal cannula oxygen Cardiovascular status: blood pressure returned to baseline and stable Postop Assessment: no apparent nausea or vomiting Anesthetic complications: no     Last Vitals:  Vitals:   08/27/19 1010 08/27/19 1020  BP: (!) 121/92 (!) 124/99  Pulse: 90 65  Resp: (!) 27 19  Temp:    SpO2: 100% 100%    Last Pain:  Vitals:   08/27/19 1010  TempSrc:   PainSc: 5                  Corinda Gubler

## 2019-08-27 NOTE — Op Note (Signed)
[facility] Gastroenterology Patient Name: Eugene Henderson Procedure Date: 08/27/2019 9:20 AM MRN: [id number] Account #: [id number] Date of Birth: Oct 24, 1950 Admit Type: Outpatient Age: 69 Room: [facility] ENDO ROOM 3 Gender: Male Note Status: Finalized Procedure:             Colonoscopy Indications:           Hematochezia Providers:             Boykin Nearing. Shain Pauwels MD, MD Medicines:             Propofol per Anesthesia Complications:         No immediate complications. Procedure:             Pre-Anesthesia Assessment:                        - The risks and benefits of the procedure and the                         sedation options and risks were discussed with the                         patient. All questions were answered and informed                         consent was obtained.                        - Patient identification and proposed procedure were                         verified prior to the procedure by the nurse.                        - ASA Grade Assessment: III - A patient with severe                         systemic disease.                        - After reviewing the risks and benefits, the patient                         was deemed in satisfactory condition to undergo the                         procedure.                        After obtaining informed consent, the colonoscope was                         passed under direct vision. Throughout the procedure,                         the patient's blood pressure, pulse, and oxygen                         saturations were monitored continuously. The  Colonoscope was introduced through the anus and                         advanced to the the cecum, identified by appendiceal                         orifice and ileocecal valve. The colonoscopy was                         performed without difficulty. The patient tolerated                         the procedure well. The quality of the bowel                          preparation was good. The ileocecal valve, appendiceal                         orifice, and rectum were photographed. Findings:      The perianal and digital rectal examinations were normal. Pertinent       negatives include normal sphincter tone and no palpable rectal lesions.      Multiple small and large-mouthed diverticula were found in the sigmoid       colon.      A 5 mm polyp was found in the rectum. The polyp was sessile. The polyp       was removed with a cold biopsy forceps. Resection and retrieval were       complete.      Non-bleeding internal hemorrhoids were found during retroflexion. The       hemorrhoids were Grade I (internal hemorrhoids that do not prolapse).      The exam was otherwise without abnormality. Impression:            - Diverticulosis in the sigmoid colon.                        - One 5 mm polyp in the rectum, removed with a cold                         biopsy forceps. Resected and retrieved.                        - Non-bleeding internal hemorrhoids.                        - The examination was otherwise normal. Recommendation:        - Return patient to hospital ward for possible                         discharge same day.                        - Await pathology results.                        - Repeat colonoscopy date to be determined after                         pending pathology results are reviewed for  surveillance.                        - Return to GI office PRN.                        - The findings and recommendations were discussed with                         the patient. Procedure Code(s):     --- Professional ---                        [phone], Colonoscopy, flexible; with biopsy, single or                         multiple Diagnosis Code(s):     --- Professional ---                        K57.30, Diverticulosis of large intestine without                         perforation or abscess without  bleeding                        K92.1, Melena (includes Hematochezia)                        K62.1, Rectal polyp                        K64.0, First degree hemorrhoids CPT copyright 2019 [employer]. All rights reserved. The codes documented in this report are preliminary and upon coder review may  be revised to meet current compliance requirements. Efrain Sella MD, MD 08/27/2019 9:48:25 AM This report has been signed electronically. Number of Addenda: 0 Note Initiated On: 08/27/2019 9:20 AM Scope Withdrawal Time: 0 hours 6 minutes 9 seconds  Total Procedure Duration: 0 hours 10 minutes 42 seconds  Estimated Blood Loss:  Estimated blood loss: none.      [facility]

## 2019-08-27 NOTE — Discharge Summary (Signed)
[facility] NAME: Eugene Henderson    MR#:  [id number]  DATE OF BIRTH:  1950-08-09  DATE OF ADMISSION:  08/24/2019 ADMITTING PHYSICIAN: Gwynne Edinger, MD  DATE OF DISCHARGE: 08/27/2019  PRIMARY CARE PHYSICIAN: Valerie Roys, DO    ADMISSION DIAGNOSIS:  Abdominal distention [R14.0] Lower GI bleed [K92.2] Gastrointestinal hemorrhage, unspecified gastrointestinal hemorrhage type [K92.2]  DISCHARGE DIAGNOSIS:  Principal Problem:   Gastrointestinal hemorrhage Active Problems:   Persistent atrial fibrillation (HCC)   Hyperthyroidism   Accelerated hypertension   Chronic systolic heart failure (HCC)   Tobacco use   AAA (abdominal aortic aneurysm) (HCC)   Alcohol abuse   COPD (chronic obstructive pulmonary disease) (HCC)   Diabetes mellitus without complication (HCC)   Acute systolic CHF (congestive heart failure) (HCC)   AF (paroxysmal atrial fibrillation) ([place])   Hyperlipidemia   SECONDARY DIAGNOSIS:   Past Medical History:  Diagnosis Date  . Arthritis   . Ascending aortic aneurysm ([place])    a. 09/2017 Stable TAA - 5.1cm.  . Asthma   . Chronic systolic CHF (congestive heart failure) (HCC)    a. EF 25-30% by echo in 07/2016 with cath showing no significant CAD b. 01/2017: EF 30-35% with diffuse HK and moderate MR; c. 07/2017 Echo: EF 30-35%, diff hK. Mild MR, mildly dil LA. PASP 106mmHg.  . Diverticulitis   . Diverticulitis of large intestine with perforation without abscess or bleeding 05/13/2017  . Hypertension   . Hyperthyroidism   . NICM (nonischemic cardiomyopathy) ([place])   . Noncompliance   . Persistent atrial fibrillation (HCC)    a. CHA2DS2VASc = 3-->Eliquis (? compliance).    HOSPITAL COURSE:   1.  GI bleed with maroon stools.  Patient given a unit of blood while he was in the emergency room.  Patient was seen by gastroenterology and colonoscopy was done on 08/27/2019.  No active bleeding seen.  Diverticulosis,  internal hemorrhoids and polyp that was removed was seen.  I will hold off the patient's Eliquis at this point and follow-up as outpatient and can potentially restart in about a week or 2.  I did prescribe Anusol suppository. 2.  Essential hypertension.  Today's blood pressure on the lower than it was yesterday.  Discontinued Norvasc.  Continue Lasix, metoprolol, spironolactone and irbesartan. 3.  Acute systolic congestive heart failure.  May have been because of fluids, Protonix drip and a unit of blood that was given in the emergency room.  I gave IV Lasix yesterday and oral Lasix today.  No signs of heart failure currently.  Patient on Lasix, metoprolol, spironolactone and irbesartan. 4.  Paroxysmal atrial fibrillation on metoprolol and digoxin.  Eliquis on hold with recent bleeding.  Can consider restarting in a few weeks. 5.  Hypothyroidism.  TSH actually elevated.  Hold methimazole at this time.  Recheck TSH in 6 weeks. 6.  Hyperlipidemia unspecified on Lipitor 7.  History of alcohol abuse.  No signs of withdrawal here.  On thiamine and multivitamin. 8.  History of hepatitis C on laboratory data back in 2019. 9.  History of ascending aortic aneurysm on CT scan back in 2019. 10.  Right knee pain resolved with Tylenol.  Uric acid normal range  Recommend checking a BMP in 1 week DISCHARGE CONDITIONS:   Satisfactory  CONSULTS OBTAINED:  Treatment Team:  Efrain Sella, MD  DRUG ALLERGIES:  No Known Allergies  DISCHARGE MEDICATIONS:   Allergies as of 08/27/2019   No  Known Allergies     Medication List    STOP taking these medications   Eliquis 5 MG Tabs tablet Generic drug: apixaban   methimazole 10 MG tablet Commonly known as: TAPAZOLE   potassium chloride SA 20 MEQ tablet Commonly known as: KLOR-CON     TAKE these medications   acetaminophen 500 MG tablet Commonly known as: TYLENOL Take 500-1,000 mg by mouth every 6 (six) hours as needed for mild pain, fever or  headache.   albuterol 108 (90 Base) MCG/ACT inhaler Commonly known as: VENTOLIN HFA Inhale 2 puffs into the lungs every 6 (six) hours as needed for wheezing or shortness of breath.   atorvastatin 40 MG tablet Commonly known as: LIPITOR Take 1 tablet (40 mg total) by mouth daily.   budesonide-formoterol 160-4.5 MCG/ACT inhaler Commonly known as: SYMBICORT Inhale 2 puffs into the lungs 2 (two) times daily.   digoxin 0.125 MG tablet Commonly known as: LANOXIN TAKE 1 TABLET BY MOUTH DAILY   folic acid 1 MG tablet Commonly known as: FOLVITE Take 1 tablet (1 mg total) by mouth daily. Start taking on: August 28, 2019   furosemide 40 MG tablet Commonly known as: LASIX Take 1 tablet (40 mg total) by mouth daily.   hydrocortisone 25 MG suppository Commonly known as: ANUSOL-HC One suppository twice a day as needed for hemmorhoids, (okay to substitute generic)   irbesartan 150 MG tablet Commonly known as: AVAPRO Take 1 tablet (150 mg total) by mouth daily. Start taking on: August 28, 2019   metFORMIN 500 MG 24 hr tablet Commonly known as: Glucophage XR Take 1 tablet (500 mg total) by mouth daily with breakfast.   metoprolol succinate 50 MG 24 hr tablet Commonly known as: TOPROL-XL Take 1 tablet (50 mg total) by mouth daily. Take with or immediately following a meal. Start taking on: August 28, 2019 What changed:   medication strength  how much to take  additional instructions   spironolactone 25 MG tablet Commonly known as: ALDACTONE Take 1 tablet (25 mg total) by mouth daily.   thiamine 100 MG tablet Take 1 tablet (100 mg total) by mouth daily. Start taking on: August 28, 2019        DISCHARGE INSTRUCTIONS:   Follow-up PMD as scheduled  If you experience worsening of your admission symptoms, develop shortness of breath, life threatening emergency, suicidal or homicidal thoughts you must seek medical attention immediately by calling 911 or calling your MD  immediately  if symptoms less severe.  You Must read complete instructions/literature along with all the possible adverse reactions/side effects for all the Medicines you take and that have been prescribed to you. Take any new Medicines after you have completely understood and accept all the possible adverse reactions/side effects.   Please note  You were cared for by a hospitalist during your hospital stay. If you have any questions about your discharge medications or the care you received while you were in the hospital after you are discharged, you can call the unit and asked to speak with the hospitalist on call if the hospitalist that took care of you is not available. Once you are discharged, your primary care physician will handle any further medical issues. Please note that NO REFILLS for any discharge medications will be authorized once you are discharged, as it is imperative that you return to your primary care physician (or establish a relationship with a primary care physician if you do not have one) for your aftercare needs so  that they can reassess your need for medications and monitor your lab values.    Today   CHIEF COMPLAINT:   Chief Complaint  Patient presents with  . Shortness of Breath    HISTORY OF PRESENT ILLNESS:  Eugene Henderson  is a 69 y.o. male came in with rectal bleeding and shortness of breath   VITAL SIGNS:  Blood pressure 109/81, pulse 84, temperature 98 F (36.7 C), resp. rate 16, height 6\' 1"  (1.854 m), weight 117.9 kg, SpO2 94 %.  I/O:    Intake/Output Summary (Last 24 hours) at 08/27/2019 1617 Last data filed at 08/27/2019 1055 Gross per 24 hour  Intake --  Output 1650 ml  Net -1650 ml    PHYSICAL EXAMINATION:  GENERAL:  69 y.o.-year-old patient lying in the bed with no acute distress.  EYES: Right eye clouded over HEENT: Head atraumatic, normocephalic. LUNGS: Normal breath sounds bilaterally, no wheezing, rales,rhonchi or crepitation. No use  of accessory muscles of respiration.  CARDIOVASCULAR: S1, S2 normal. No murmurs, rubs, or gallops.  ABDOMEN: Soft, non-tender, non-distended. Bowel sounds present. No organomegaly or mass.  EXTREMITIES: Trace pedal edema.  NEUROLOGIC: Cranial nerves II through XII are intact. Muscle strength 5/5 in all extremities. Sensation intact. Gait not checked.  PSYCHIATRIC: The patient is alert and oriented x 3.  SKIN: No obvious rash, lesion, or ulcer.   DATA REVIEW:   CBC Recent Labs  Lab 08/27/19 0650  WBC 4.8  HGB 12.7*  HCT 37.9*  PLT 149*    Chemistries  Recent Labs  Lab 08/25/19 0448 08/26/19 0714 08/27/19 0650  NA 138   < > 139  K 4.0   < > 3.6  CL 106   < > 106  CO2 26   < > 26  GLUCOSE 84   < > 104*  BUN 11   < > 7*  CREATININE 0.63   < > 0.83  CALCIUM 8.0*   < > 8.8*  AST 22  --   --   ALT 26  --   --   ALKPHOS 71  --   --   BILITOT 1.9*  --   --    < > = values in this interval not displayed.    Microbiology Results  Results for orders placed or performed during the hospital encounter of 08/24/19  SARS CORONAVIRUS 2 (TAT 6-24 HRS) Nasopharyngeal Nasopharyngeal Swab     Status: None   Collection Time: 08/24/19 10:46 PM   Specimen: Nasopharyngeal Swab  Result Value Ref Range Status   SARS Coronavirus 2 NEGATIVE NEGATIVE Final    Comment: (NOTE) SARS-CoV-2 target nucleic acids are NOT DETECTED. The SARS-CoV-2 RNA is generally detectable in upper and lower respiratory specimens during the acute phase of infection. Negative results do not preclude SARS-CoV-2 infection, do not rule out co-infections with other pathogens, and should not be used as the sole basis for treatment or other patient management decisions. Negative results must be combined with clinical observations, patient history, and epidemiological information. The expected result is Negative. Fact Sheet for Patients: 08/26/19 Fact Sheet for Healthcare  Providers: HairSlick.no This test is not yet approved or cleared by the quierodirigir.com FDA and  has been authorized for detection and/or diagnosis of SARS-CoV-2 by FDA under an Emergency Use Authorization (EUA). This EUA will remain  in effect (meaning this test can be used) for the duration of the COVID-19 declaration under Section 56 4(b)(1) of the Act, 21 U.S.C. section 360bbb-3(b)(1), unless  the authorization is terminated or revoked sooner. Performed at [facility], [address], [place], [place] [address]      Management plans discussed with the patient, family (daughter yesterday) and they are in agreement.  CODE STATUS:     Code Status Orders  (From admission, onward)         Start     Ordered   08/24/19 2319  Full code  Continuous     08/24/19 2318        Code Status History    Date Active Date Inactive Code Status Order ID Comments User Context   07/14/2017 0933 07/16/2017 1620 Full Code [id number]  Arnaldo Natal, MD Inpatient   05/13/2017 2209 05/16/2017 1847 Full Code [id number]  Henrene Dodge, MD ED   11/22/2016 2110 11/26/2016 1344 Full Code [id number]  Ihor Austin, MD ED   07/28/2016 1355 08/04/2016 2151 Full Code [id number]  Houston Siren, MD ED   Advance Care Planning Activity      TOTAL TIME TAKING CARE OF THIS PATIENT: 35 minutes.    Alford Highland M.D on 08/27/2019 at 4:17 PM  Between 7am to 6pm - Pager - [phone]  After 6pm go to [email or web address] - password [email or web address]  [facility]  CC: Primary care physician; Dorcas Carrow, DO

## 2019-08-27 NOTE — Anesthesia Preprocedure Evaluation (Signed)
Anesthesia Evaluation  Patient identified by MRN, date of birth, ID band Patient awake  General Assessment Comment:Patient said he had to be defibrillated during a surgery in the 70s. No more details able to be elicited  Reviewed: Allergy & Precautions, NPO status , Patient's Chart, lab work & pertinent test results  History of Anesthesia Complications Negative for: history of anesthetic complications  Airway Mallampati: II  TM Distance: >3 FB Neck ROM: Full    Dental no notable dental hx. (+) Teeth Intact, Edentulous Upper   Pulmonary asthma , neg sleep apnea, COPD,  COPD inhaler, Current Smoker and Patient abstained from smoking.,  No hx of OSA but on cath report it says patient had periods of apnea   Pulmonary exam normal breath sounds clear to auscultation       Cardiovascular Exercise Tolerance: Good METShypertension, Pt. on medications +CHF  (-) CAD and (-) Past MI + dysrhythmias Atrial Fibrillation  Rhythm:Irregular Rate:Normal - Systolic murmurs TTE 2019:  - Left ventricle: The cavity size was normal. There was mild  concentric hypertrophy. Systolic function was moderately to  severely reduced. The estimated ejection fraction was in the  range of 30% to 35%. Diffuse hypokinesis. The study is not  technically sufficient to allow evaluation of LV diastolic  function.  - Mitral valve: There was mild regurgitation.  - Left atrium: The atrium was mildly dilated.  - Right ventricle: Systolic function was normal.  - Right atrium: The atrium was mildly dilated.  - Pulmonary arteries: Systolic pressure was mildly elevated. PA  peak pressure: 40 mm Hg (S).   Cath 2018: 1. No significant coronary artery disease. 2. Severely reduced LV systolic function by echo. Left ventricular angiography was not performed.  3. Right heart catheterization showed minimally elevated filling pressures and pulmonary hypertension.  Moderately reduced cardiac output. RA pressure: 12 mmHg, RV pressure: 40 over 6 mmHg, PA pressure 37/12 with a mean of 12 mmHg. Pulmonary capillary wedge pressure was 12. Cardiac output was 4.16 with a cardiac index of 1.77.   Neuro/Psych negative neurological ROS  negative psych ROS   GI/Hepatic neg GERD  ,(+)     (-) substance abuse  , Hepatitis -, C  Endo/Other  diabetes  Renal/GU negative Renal ROS     Musculoskeletal   Abdominal   Peds  Hematology   Anesthesia Other Findings Past Medical History: No date: Arthritis No date: Ascending aortic aneurysm (HCC)     Comment:  a. 09/2017 Stable TAA - 5.1cm. No date: Asthma No date: Chronic systolic CHF (congestive heart failure) (HCC)     Comment:  a. EF 25-30% by echo in 07/2016 with cath showing no               significant CAD b. 01/2017: EF 30-35% with diffuse HK and              moderate MR; c. 07/2017 Echo: EF 30-35%, diff hK. Mild MR,              mildly dil LA. PASP . No date: Diverticulitis 05/13/2017: Diverticulitis of large intestine with perforation without  abscess or bleeding No date: Hypertension No date: Hyperthyroidism No date: NICM (nonischemic cardiomyopathy) (HCC) No date: Noncompliance No date: Persistent atrial fibrillation (HCC)     Comment:  a. CHA2DS2VASc = 3-->Eliquis (? compliance).  Reproductive/Obstetrics  Anesthesia Physical Anesthesia Plan  ASA: IV  Anesthesia Plan: General   Post-op Pain Management:    Induction: Intravenous  PONV Risk Score and Plan: 2 and Ondansetron, Propofol infusion and TIVA  Airway Management Planned: Nasal Cannula  Additional Equipment: None  Intra-op Plan:   Post-operative Plan:   Informed Consent: I have reviewed the patients History and Physical, chart, labs and discussed the procedure including the risks, benefits and alternatives for the proposed anesthesia with the patient or authorized  representative who has indicated his/her understanding and acceptance.     Dental advisory given  Plan Discussed with: CRNA and Surgeon  Anesthesia Plan Comments: (Discussed risks of anesthesia with patient, including possibility of difficulty with spontaneous ventilation under anesthesia necessitating airway intervention, PONV, and rare risks such as cardiac or respiratory or neurological events. Patient understands.)        Anesthesia Quick Evaluation

## 2019-08-27 NOTE — Care Management Important Message (Signed)
Important Message  Patient Details  Name: Eugene Henderson MRN: [id number] Date of Birth: [birth year]   Medicare Important Message Given:  Yes     Johnell Comings 08/27/2019, 11:56 AM

## 2019-08-28 ENCOUNTER — Telehealth: Payer: Self-pay | Admitting: Family

## 2019-08-28 ENCOUNTER — Encounter: Payer: Self-pay | Admitting: *Deleted

## 2019-08-28 ENCOUNTER — Encounter: Payer: Medicare HMO | Admitting: Family Medicine

## 2019-08-28 NOTE — Telephone Encounter (Signed)
Spoke with patients daughter who said patient is doing ok since he got home just resting. He hasnt been keeping up with his daily weights but is going to start. He is following a low sodium diet the best he can and trying to stay active.    Deetta Perla, [place]

## 2019-08-29 ENCOUNTER — Telehealth: Payer: Self-pay

## 2019-08-29 NOTE — Telephone Encounter (Signed)
I have made the 1st attempt to contact the patient or family member in charge, in order to follow up from recently being discharged from the hospital. I left a message on voicemail but I will make another attempt at a different time.  

## 2019-08-30 NOTE — Telephone Encounter (Signed)
Discharged from hospital on 08/27/2019 from [facility].  I have made the 2nd attempt to contact the patient or family member in charge, in order to follow up from recently being discharged from the hospital. I left a message on voicemail.   Please schedule hospital follow up appointment within 7 days of discharge.

## 2019-08-30 NOTE — Progress Notes (Deleted)
Cardiology Office Note  Date: 08/30/2019   ID: Eugene Henderson, DOB 07/23/1950, MRN [id number]  PCP:  Dorcas Carrow, DO  Cardiologist:  Lorine Bears, MD Electrophysiologist:  None   Chief Complaint: Follow-up chronic HFrEF, persistent AF, nonischemic cardiomyopathy with most recent echocardiogram EF of 30 to 35%.  History of Present Illness: Eugene Henderson is a 69 y.o. male with a history of NICM, chronic HFrEF, persistent AF (CHA2DS2-VASc score of 3) on Eliquis with questionable compliance, HTN, ascending aortic aneurysm, hyperthyroidism, alcohol abuse,, diverticulitis, hepatitis C virus  Last seen by Eugene Edelson, NP on 09/14/2017.  He has been followed by heart failure clinic.  During last visit with Eugene Henderson patient reported compliance with his medication and weighing regularly.  He admitted to putting on some weight and was not very careful with salt intake.  He complained of some exertional dyspnea when walking upstairs.  He admitted to be basically being sedentary.  Patient admitted to being noncompliant with his Eliquis therapy.  He expressed interest in cardioversion if offered..  Most recent echocardiogram on 07/14/2017 showed mild concentric LVH, EF 30 to 35%, diffuse hypokinesis, mild mitral regurgitation,  PASP mildly elevated with peak pressure at 40 mmHg.  Recent presentation to [facility] emergency room on 08/24/2019 for evaluation of bloody stools and shortness of breath.  He states he recently started drinking again.  Has a history of polysubstance abuse.  His blood pressure was elevated on arrival to the emergency room.  Colonoscopy showed diverticulosis, a 5 mm sessile polyp which was removed.  Nonbleeding internal hemorrhoids.  Otherwise normal exam.  Past Medical History:  Diagnosis Date  . Arthritis   . Ascending aortic aneurysm (HCC)    a. 09/2017 Stable TAA - 5.1cm.  . Asthma   . Chronic systolic CHF (congestive heart failure) (HCC)    a. EF 25-30% by echo in  07/2016 with cath showing no significant CAD b. 01/2017: EF 30-35% with diffuse HK and moderate MR; c. 07/2017 Echo: EF 30-35%, diff hK. Mild MR, mildly dil LA. PASP .  . Diverticulitis   . Diverticulitis of large intestine with perforation without abscess or bleeding 05/13/2017  . Hypertension   . Hyperthyroidism   . NICM (nonischemic cardiomyopathy) (HCC)   . Noncompliance   . Persistent atrial fibrillation (HCC)    a. CHA2DS2VASc = 3-->Eliquis (? compliance).    Past Surgical History:  Procedure Laterality Date  . COLONOSCOPY N/A 08/27/2019   Procedure: COLONOSCOPY;  Surgeon: Eugene, Boykin Nearing, MD;  Location: [facility] ENDOSCOPY;  Service: Gastroenterology;  Laterality: N/A;  . JOINT REPLACEMENT Right   . RIGHT/LEFT HEART CATH AND CORONARY ANGIOGRAPHY N/A 08/02/2016   Procedure: Right/Left Heart Cath and Coronary Angiography;  Surgeon: Eugene Ouch, MD;  Location: [facility] INVASIVE CV LAB;  Service: Cardiovascular;  Laterality: N/A;  . TESTICLE SURGERY     Patient states that he had to have the tube fixed.    Current Outpatient Medications  Medication Sig Dispense Refill  . acetaminophen (TYLENOL) 500 MG tablet Take 500-1,000 mg by mouth every 6 (six) hours as needed for mild pain, fever or headache.     . albuterol (PROVENTIL HFA;VENTOLIN HFA) 108 (90 Base) MCG/ACT inhaler Inhale 2 puffs into the lungs every 6 (six) hours as needed for wheezing or shortness of breath. 1 Inhaler 3  . atorvastatin (LIPITOR) 40 MG tablet Take 1 tablet (40 mg total) by mouth daily. 90 tablet 0  . budesonide-formoterol (SYMBICORT) 160-4.5 MCG/ACT inhaler Inhale 2 puffs  into the lungs 2 (two) times daily. 3 Inhaler 0  . digoxin (LANOXIN) 0.125 MG tablet TAKE 1 TABLET BY MOUTH DAILY (Patient taking differently: Take 0.125 mg by mouth daily. ) 90 tablet 0  . folic acid (FOLVITE) 1 MG tablet Take 1 tablet (1 mg total) by mouth daily. 30 tablet 0  . furosemide (LASIX) 40 MG tablet Take 1 tablet (40 mg total) by  mouth daily. 90 tablet 0  . hydrocortisone (ANUSOL-HC) 25 MG suppository One suppository twice a day as needed for hemmorhoids, (okay to substitute generic) 24 suppository 1  . irbesartan (AVAPRO) 150 MG tablet Take 1 tablet (150 mg total) by mouth daily. 30 tablet 0  . metFORMIN (GLUCOPHAGE XR) 500 MG 24 hr tablet Take 1 tablet (500 mg total) by mouth daily with breakfast. 90 tablet 0  . metoprolol succinate (TOPROL-XL) 50 MG 24 hr tablet Take 1 tablet (50 mg total) by mouth daily. Take with or immediately following a meal. 30 tablet 0  . spironolactone (ALDACTONE) 25 MG tablet Take 1 tablet (25 mg total) by mouth daily. 90 tablet 0  . thiamine 100 MG tablet Take 1 tablet (100 mg total) by mouth daily. 30 tablet 0   No current facility-administered medications for this visit.   Allergies:  Patient has no known allergies.   Social History: The patient  reports that he has been smoking cigarettes. He has a 6.25 pack-year smoking history. He has never used smokeless tobacco. He reports current alcohol use. He reports that he does not use drugs.   Family History: The patient's family history includes Diabetes in his brother, father, and mother; Heart attack in his brother; Heart disease in his brother; Kidney failure in his brother.   ROS:  Please see the history of present illness. Otherwise, complete review of systems is positive for {NONE DEFAULTED:18576::"none"}.  All other systems are reviewed and negative.  ROS  Physical Exam: VS:  There were no vitals taken for this visit., BMI There is no height or weight on file to calculate BMI.  Wt Readings from Last 3 Encounters:  08/24/19 260 lb (117.9 kg)  12/11/18 280 lb 4 oz (127.1 kg)  05/16/18 259 lb (117.5 kg)    General: Patient appears comfortable at rest. HEENT: Conjunctiva and lids normal, oropharynx clear with moist mucosa. Neck: Supple, no elevated JVP or carotid bruits, no thyromegaly. Lungs: Clear to auscultation, nonlabored  breathing at rest. Cardiac: Regular rate and rhythm, no S3 or significant systolic murmur, no pericardial rub. Abdomen: Soft, nontender, no hepatomegaly, bowel sounds present, no guarding or rebound. Extremities: No pitting edema, distal pulses 2+. Skin: Warm and dry. Musculoskeletal: No kyphosis. Neuropsychiatric: Alert and oriented x3, affect grossly appropriate.  ECG:  {EKG/Telemetry Strips Reviewed:[phone]}  Recent Labwork: 08/24/2019: B Natriuretic Peptide 274.0 08/25/2019: ALT 26; AST 22; TSH 7.018 08/27/2019: BUN 7; Creatinine, Ser 0.83; Hemoglobin 12.7; Platelets 149; Potassium 3.6; Sodium 139     Component Value Date/Time   CHOL 82 (L) 12/11/2018 1600   TRIG 86 12/11/2018 1600   HDL 53 12/11/2018 1600   LDLCALC 12 12/11/2018 1600    Other Studies Reviewed Today:  Echocardiogram 07/14/2017 Study Conclusions   - Left ventricle: The cavity size was normal. There was mild  concentric hypertrophy. Systolic function was moderately to  severely reduced. The estimated ejection fraction was in the  range of 30% to 35%. Diffuse hypokinesis. The study is not  technically sufficient to allow evaluation of LV diastolic  function.  -  Mitral valve: There was mild regurgitation.  - Left atrium: The atrium was mildly dilated.  - Right ventricle: Systolic function was normal.  - Right atrium: The atrium was mildly dilated.  - Pulmonary arteries: Systolic pressure was mildly elevated. PA  peak pressure: 40 mm Hg (S).   Impressions:   - Rhythm is atrial fibrillation.   Assessment and Plan:  1. Chronic systolic heart failure (HCC)   2. Essential hypertension   3. Persistent atrial fibrillation (HCC)   4. Tobacco use   5. Thoracic ascending aortic aneurysm (HCC)      Medication Adjustments/Labs and Tests Ordered: Current medicines are reviewed at length with the patient today.  Concerns regarding medicines are outlined above.   Disposition: Follow-up with  ***  Signed, Rennis Harding, NP 08/30/2019 10:16 PM    [facility]

## 2019-08-31 ENCOUNTER — Ambulatory Visit: Payer: Medicare HMO | Admitting: Physician Assistant

## 2019-09-03 ENCOUNTER — Ambulatory Visit: Payer: Self-pay | Admitting: General Practice

## 2019-09-03 ENCOUNTER — Encounter: Payer: Self-pay | Admitting: Physician Assistant

## 2019-09-03 NOTE — Chronic Care Management (AMB) (Signed)
°  Chronic Care Management   Outreach Note  09/03/2019 Name: Eugene Henderson MRN: [id number] DOB: 26-Dec-1950  Referred by: Dorcas Carrow, DO Reason for referral : Chronic Care Management ([place]- post discharge attempt x 1)   An unsuccessful telephone outreach was attempted today. The patient was referred to the case management team for assistance with care management and care coordination. The patients daughter Toniann Fail answered but she was not at home with the patient and ask for a call back on another day.   Follow Up Plan: The care management team will reach out to the patient again over the next 2 to 7  days.   Alto Denver RN, MSN, CCM Community Care Coordinator [place]   [facility] Mobile: [phone]

## 2019-09-05 ENCOUNTER — Ambulatory Visit: Payer: Medicare HMO | Admitting: Family

## 2019-09-05 ENCOUNTER — Telehealth: Payer: Self-pay | Admitting: Family

## 2019-09-05 ENCOUNTER — Ambulatory Visit: Payer: Self-pay | Admitting: General Practice

## 2019-09-05 NOTE — Progress Notes (Deleted)
Patient ID: Eugene Henderson, male    DOB: 05-Jan-1951, 69 y.o.   MRN: [id number]  HPI  [name] is a 69 y/o male with a history of atrial fibrillation, hyperthyroidism, HTN, asthma, arthritis, current tobacco use and chronic heart failure.   Echo done 07/14/17 reviewed and showed an EF of 30-35% along with a mildly elevated PA pressure of 40 mm Hg. Last echo done 02/02/17 was 30-35%. Echo done 07/29/16 and showed an EF of 25-30% along with moderate MR and moderately increased PA pressure of 50 mm Hg.   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   Admitted 08/24/19 due to GIB and abdominal distention. GI consult obtained. Patient given 1 unit of blood. Colonoscopy done which showed no active bleeding. Polyps removed and apixaban held. Received IV lasix and then transitioned back to oral diuretics. Discharged after 3 days.   He presents today for a follow-up appointment although hasn't been seen since April 2019. He presents with a chief complaint of  Past Medical History:  Diagnosis Date  . Arthritis   . Ascending aortic aneurysm (HCC)    a. 09/2017 Stable TAA - 5.1cm.  . Asthma   . Chronic systolic CHF (congestive heart failure) (HCC)    a. EF 25-30% by echo in 07/2016 with cath showing no significant CAD b. 01/2017: EF 30-35% with diffuse HK and moderate MR; c. 07/2017 Echo: EF 30-35%, diff hK. Mild MR, mildly dil LA. PASP .  . Diverticulitis   . Diverticulitis of large intestine with perforation without abscess or bleeding 05/13/2017  . Hypertension   . Hyperthyroidism   . NICM (nonischemic cardiomyopathy) (HCC)   . Noncompliance   . Persistent atrial fibrillation (HCC)    a. CHA2DS2VASc = 3-->Eliquis (? compliance).   Past Surgical History:  Procedure Laterality Date  . COLONOSCOPY N/A 08/27/2019   Procedure: COLONOSCOPY;  Surgeon: [place], Boykin Nearing, MD;  Location: [facility] ENDOSCOPY;  Service:  Gastroenterology;  Laterality: N/A;  . JOINT REPLACEMENT Right   . RIGHT/LEFT HEART CATH AND CORONARY ANGIOGRAPHY N/A 08/02/2016   Procedure: Right/Left Heart Cath and Coronary Angiography;  Surgeon: Iran Ouch, MD;  Location: [facility] INVASIVE CV LAB;  Service: Cardiovascular;  Laterality: N/A;  . TESTICLE SURGERY     Patient states that he had to have the tube fixed.   Family History  Problem Relation Age of Onset  . Diabetes Mother   . Diabetes Father   . Heart disease Brother   . Heart attack Brother   . Diabetes Brother   . Kidney failure Brother   . Thyroid disease Neg Hx    Social History   Tobacco Use  . Smoking status: Current Some Day Smoker    Packs/day: 0.25    Years: 25.00    Pack years: 6.25    Types: Cigarettes  . Smokeless tobacco: Never Used  . Tobacco comment: pipe tobacco   Substance Use Topics  . Alcohol use: Yes    Comment: Socially - once a month   No Known Allergies    Review of Systems  Constitutional: Negative for appetite change and fatigue.  HENT: Negative for congestion, postnasal drip and sore throat.   Eyes: Positive for pain (right eye), discharge (especially in the morning) and visual disturbance (blurry vision in right eye).  Respiratory: Positive for cough and shortness of breath (when getting up steps). Negative for chest tightness and wheezing.   Cardiovascular: Negative for chest  pain, palpitations and leg swelling.  Gastrointestinal: Negative for abdominal distention and abdominal pain.  Endocrine: Negative.   Genitourinary: Negative.   Musculoskeletal: Positive for arthralgias (knee pain) and back pain.  Skin: Negative.   Allergic/Immunologic: Negative.   Neurological: Positive for dizziness. Negative for weakness and light-headedness.  Hematological: Negative for adenopathy. Does not bruise/bleed easily.  Psychiatric/Behavioral: Negative for dysphoric mood and sleep disturbance (sleeping better). The patient is not  nervous/anxious.      Physical Exam  Constitutional: He is oriented to person, place, and time. He appears well-developed and well-nourished.  HENT:  Head: Normocephalic and atraumatic.  Eyes: Right eye exhibits discharge. Right conjunctiva is injected. Right eye exhibits abnormal extraocular motion.  Right eye iris is clouded over  Neck: No JVD present.  Cardiovascular: Normal rate. An irregular rhythm present.  Pulmonary/Chest: Effort normal. He has no wheezes. He has no rales.  Abdominal: Soft. He exhibits no distension. There is no abdominal tenderness.  Musculoskeletal:        General: No tenderness or edema.     Cervical back: Normal range of motion and neck supple.  Neurological: He is alert and oriented to person, place, and time.  Skin: Skin is warm and dry.  Psychiatric: He has a normal mood and affect. His behavior is normal. Thought content normal.  Nursing note and vitals reviewed.  Assessment & Plan:  1: Chronic heart failure with reduced ejection fraction- - NYHA class II - euvolemic today - admits to not weighing daily at home because of his vision and he was instructed to call for an overnight weight gain of >2 pounds or a weekly weight gain of >5 pounds when he does weigh  - still using salt on some foods;  Instructed to not add any salt to food. Reminded him to follow a 2000mg  sodium diet.  - BNP from 08/24/19 was 274.0 - saw cardiology Sharolyn Douglas) 09/14/17   2: HTN- - BP  - BMP from 08/27/19 reviewed which shows sodium 139, potassium 3.6, creatinine 0.83 and GFR >60 - saw PCP Wynetta Emery) on 06/21/19  3: Atrial fibrillation- - currently on apixaban, digoxin and max metoprolol XL - last dig level on 07/14/17 was 0.4 - discussion being had regarding possible cardioversion in the future  4. Tobacco- - currently smoking tobacco 1 pack cigarettes/week - complete cessation discussed for 3 minutes with him    Patient did not bring his medications nor a list

## 2019-09-05 NOTE — Chronic Care Management (AMB) (Signed)
°  Chronic Care Management   Outreach Note  09/05/2019 Name: Eugene Henderson MRN: [id number] DOB: 04-04-1951  Referred by: Dorcas Carrow, DO Reason for referral : Chronic Care Management (2nd attempt post hospital discharge)   A second unsuccessful telephone outreach was attempted today. The patient was referred to the case management team for assistance with care management and care coordination.   Follow Up Plan: A HIPPA compliant phone message was left for the patient providing contact information and requesting a return call.   Alto Denver RN, MSN, CCM Community Care Coordinator [place]   [facility] Mobile: [phone]

## 2019-09-05 NOTE — Telephone Encounter (Signed)
Patient did not show for his Heart Failure Clinic appointment on 09/05/19. Will attempt to reschedule.  

## 2019-09-17 ENCOUNTER — Other Ambulatory Visit: Payer: Self-pay | Admitting: Family Medicine

## 2019-09-20 ENCOUNTER — Other Ambulatory Visit: Payer: Self-pay | Admitting: Family Medicine

## 2019-09-21 ENCOUNTER — Telehealth: Payer: Self-pay

## 2019-09-21 ENCOUNTER — Ambulatory Visit: Payer: Self-pay | Admitting: General Practice

## 2019-09-21 NOTE — Chronic Care Management (AMB) (Signed)
°  Chronic Care Management   Outreach Note  09/21/2019 Name: Eugene Henderson MRN: [id number] DOB: 03-Feb-1951  Referred by: Dorcas Carrow, DO Reason for referral : Chronic Care Management (4th attempt to review post discharge/assess chronic conditions)   Fourth unsuccessful telephone outreach was attempted today. The patient was referred to the case management team for assistance with care management and care coordination. The patient's primary care provider has been notified of our unsuccessful attempts to make or maintain contact with the patient. The care management team is pleased to engage with this patient at any time in the future should he/she be interested in assistance from the care management team.   Follow Up Plan: The care management team is available to follow up with the patient after provider conversation with the patient regarding recommendation for care management engagement and subsequent re-referral to the care management team.   Alto Denver RN, MSN, CCM Community Care Coordinator [place]   [facility] Mobile: [phone]

## 2019-10-12 ENCOUNTER — Other Ambulatory Visit: Payer: Self-pay | Admitting: Family Medicine

## 2019-11-18 ENCOUNTER — Other Ambulatory Visit: Payer: Self-pay | Admitting: Family Medicine

## 2019-12-13 ENCOUNTER — Other Ambulatory Visit: Payer: Self-pay | Admitting: Family Medicine

## 2019-12-24 ENCOUNTER — Other Ambulatory Visit: Payer: Self-pay | Admitting: Family Medicine

## 2020-01-24 ENCOUNTER — Other Ambulatory Visit: Payer: Self-pay | Admitting: Family Medicine

## 2020-02-12 ENCOUNTER — Other Ambulatory Visit: Payer: Self-pay | Admitting: Family Medicine

## 2020-02-28 ENCOUNTER — Ambulatory Visit: Payer: Medicare HMO | Admitting: Family Medicine

## 2020-02-28 ENCOUNTER — Other Ambulatory Visit: Payer: Self-pay | Admitting: Family Medicine

## 2020-02-28 NOTE — Telephone Encounter (Signed)
Requested Prescriptions  Pending Prescriptions Disp Refills   furosemide (LASIX) 40 MG tablet [Pharmacy Med Name: FUROSEMIDE 40 MG TABLET] 30 tablet 0    Sig: TAKE 1 TABLET (40 MG TOTAL) BY MOUTH DAILY AS NEEDED. FOR SHORTNESS OF BREATH/LOWER EXTREMITY EDEMA     Cardiovascular:  Diuretics - Loop Failed - 02/28/2020  2:32 PM      Failed - Ca in normal range and within 360 days    Calcium  Date Value Ref Range Status  08/27/2019 8.8 (L) 8.9 - 10.3 mg/dL Final         Failed - Valid encounter within last 6 months    Recent Outpatient Visits          8 months ago Essential hypertension   [facility] [place], Megan P, DO   1 year ago Diabetes mellitus without complication (HCC)   [facility] [place], Megan P, DO   1 year ago Hyperthyroidism   [facility] [place], Megan P, DO   1 year ago Medicare annual wellness visit, subsequent   [facility] [facility], [place], DO   1 year ago Chronic systolic heart failure (HCC)   [facility] [place], Megan P, DO      Future Appointments            In 3 months [facility], [facility]            Passed - K in normal range and within 360 days    Potassium  Date Value Ref Range Status  08/27/2019 3.6 3.5 - 5.1 mmol/L Final         Passed - Na in normal range and within 360 days    Sodium  Date Value Ref Range Status  08/27/2019 139 135 - 145 mmol/L Final  12/11/2018 137 134 - 144 mmol/L Final         Passed - Cr in normal range and within 360 days    Creatinine, Ser  Date Value Ref Range Status  08/27/2019 0.83 0.61 - 1.24 mg/dL Final         Passed - Last BP in normal range    BP Readings from Last 1 Encounters:  08/27/19 109/81

## 2020-03-27 ENCOUNTER — Other Ambulatory Visit: Payer: Self-pay | Admitting: Family Medicine

## 2020-03-27 NOTE — Telephone Encounter (Signed)
PT HAS Apt on  03/28/2020

## 2020-03-27 NOTE — Telephone Encounter (Signed)
Requested medication (s) are due for refill today - yes  Requested medication (s) are on the active medication list -yes  Future visit scheduled -yes  Last refill: 02/28/20  Notes to clinic: Patient has appointment scheduled for tomorrow- has already received courtesy RF- sent for review/hold until tomorrow appointment  Requested Prescriptions  Pending Prescriptions Disp Refills   furosemide (LASIX) 40 MG tablet [Pharmacy Med Name: FUROSEMIDE 40 MG TABLET] 30 tablet 0    Sig: TAKE 1 TABLET (40 MG TOTAL) BY MOUTH DAILY AS NEEDED. FOR SHORTNESS OF BREATH/LOWER EXTREMITY EDEMA      Cardiovascular:  Diuretics - Loop Failed - 03/27/2020 10:32 AM      Failed - Ca in normal range and within 360 days    Calcium  Date Value Ref Range Status  08/27/2019 8.8 (L) 8.9 - 10.3 mg/dL Final          Failed - Valid encounter within last 6 months    Recent Outpatient Visits           9 months ago Essential hypertension   [facility] [place], Megan P, DO   1 year ago Diabetes mellitus without complication (HCC)   [facility] [place], Megan P, DO   1 year ago Hyperthyroidism   [facility] [place], Megan P, DO   1 year ago Medicare annual wellness visit, subsequent   [employer], [place], DO   1 year ago Chronic systolic heart failure [facility])   [facility] [place], [place], DO       Future Appointments             Tomorrow Dorcas Carrow, DO [facility], [facility]   In 3 months  [facility], [facility]            Passed - K in normal range and within 360 days    Potassium  Date Value Ref Range Status  08/27/2019 3.6 3.5 - 5.1 mmol/L Final          Passed - Na in normal range and within 360 days    Sodium  Date Value Ref Range Status  08/27/2019 139 135 - 145 mmol/L Final  12/11/2018 137 134 - 144 mmol/L Final          Passed - Cr in normal range and within 360 days    Creatinine, Ser   Date Value Ref Range Status  08/27/2019 0.83 0.61 - 1.24 mg/dL Final          Passed - Last BP in normal range    BP Readings from Last 1 Encounters:  08/27/19 109/81              Requested Prescriptions  Pending Prescriptions Disp Refills   furosemide (LASIX) 40 MG tablet [Pharmacy Med Name: FUROSEMIDE 40 MG TABLET] 30 tablet 0    Sig: TAKE 1 TABLET (40 MG TOTAL) BY MOUTH DAILY AS NEEDED. FOR SHORTNESS OF BREATH/LOWER EXTREMITY EDEMA      Cardiovascular:  Diuretics - Loop Failed - 03/27/2020 10:32 AM      Failed - Ca in normal range and within 360 days    Calcium  Date Value Ref Range Status  08/27/2019 8.8 (L) 8.9 - 10.3 mg/dL Final          Failed - Valid encounter within last 6 months    Recent Outpatient Visits           9 months ago Essential hypertension  [facility] [place], Megan P, DO   1 year ago Diabetes mellitus without complication [facility])   [facility] [place], [place], DO   1 year ago Hyperthyroidism   [facility] [place], [place], DO   1 year ago Medicare annual wellness visit, subsequent   [facility] [place], [place], DO   1 year ago Chronic systolic heart failure [facility])   [facility] [place], [place], DO       Future Appointments             Tomorrow Dorcas Carrow, DO [facility], [facility]   In 3 months  [facility], [facility]            Passed - K in normal range and within 360 days    Potassium  Date Value Ref Range Status  08/27/2019 3.6 3.5 - 5.1 mmol/L Final          Passed - Na in normal range and within 360 days    Sodium  Date Value Ref Range Status  08/27/2019 139 135 - 145 mmol/L Final  12/11/2018 137 134 - 144 mmol/L Final          Passed - Cr in normal range and within 360 days    Creatinine, Ser  Date Value Ref Range Status  08/27/2019 0.83 0.61 - 1.24 mg/dL Final          Passed - Last BP in normal range    BP Readings from  Last 1 Encounters:  08/27/19 109/81

## 2020-03-28 ENCOUNTER — Encounter: Payer: Self-pay | Admitting: Family Medicine

## 2020-03-28 ENCOUNTER — Other Ambulatory Visit: Payer: Self-pay

## 2020-03-28 ENCOUNTER — Ambulatory Visit (INDEPENDENT_AMBULATORY_CARE_PROVIDER_SITE_OTHER): Payer: Medicare HMO | Admitting: Family Medicine

## 2020-03-28 VITALS — BP 169/125 | HR 113 | Temp 99.3°F | Ht 73.0 in | Wt 260.0 lb

## 2020-03-28 DIAGNOSIS — I10 Essential (primary) hypertension: Secondary | ICD-10-CM

## 2020-03-28 DIAGNOSIS — Z23 Encounter for immunization: Secondary | ICD-10-CM | POA: Diagnosis not present

## 2020-03-28 DIAGNOSIS — E785 Hyperlipidemia, unspecified: Secondary | ICD-10-CM

## 2020-03-28 DIAGNOSIS — H169 Unspecified keratitis: Secondary | ICD-10-CM | POA: Diagnosis not present

## 2020-03-28 DIAGNOSIS — B182 Chronic viral hepatitis C: Secondary | ICD-10-CM | POA: Diagnosis not present

## 2020-03-28 DIAGNOSIS — E0590 Thyrotoxicosis, unspecified without thyrotoxic crisis or storm: Secondary | ICD-10-CM | POA: Diagnosis not present

## 2020-03-28 DIAGNOSIS — R3911 Hesitancy of micturition: Secondary | ICD-10-CM

## 2020-03-28 DIAGNOSIS — E119 Type 2 diabetes mellitus without complications: Secondary | ICD-10-CM

## 2020-03-28 LAB — URINALYSIS, ROUTINE W REFLEX MICROSCOPIC
Bilirubin, UA: NEGATIVE
Ketones, UA: NEGATIVE
Leukocytes,UA: NEGATIVE
Nitrite, UA: NEGATIVE
Protein,UA: NEGATIVE
RBC, UA: NEGATIVE
Specific Gravity, UA: <1.005 — ABNORMAL LOW (ref 1.005–1.030)
Urobilinogen, Ur: 2 mg/dL — ABNORMAL HIGH (ref 0.2–1.0)
pH, UA: 5.5 (ref 5.0–7.5)

## 2020-03-28 LAB — MICROALBUMIN, URINE WAIVED
Creatinine, Urine Waived: 100 mg/dL (ref 10–300)
Microalb, Ur Waived: 30 mg/L — ABNORMAL HIGH (ref 0–19)
Microalb/Creat Ratio: <30 mg/g (ref <30)

## 2020-03-28 LAB — BAYER DCA HB A1C WAIVED: HB A1C (BAYER DCA - WAIVED): 6.5 % (ref <7.0)

## 2020-03-28 MED ORDER — METFORMIN HCL ER 500 MG PO TB24
ORAL_TABLET | ORAL | 1 refills | Status: DC
Start: 2020-03-28 — End: 2020-10-30

## 2020-03-28 MED ORDER — IRBESARTAN 150 MG PO TABS
150.0000 mg | ORAL_TABLET | Freq: Every day | ORAL | 1 refills | Status: DC
Start: 2020-03-28 — End: 2020-10-20

## 2020-03-28 MED ORDER — SPIRONOLACTONE 25 MG PO TABS
25.0000 mg | ORAL_TABLET | Freq: Every day | ORAL | 1 refills | Status: DC
Start: 2020-03-28 — End: 2020-10-20

## 2020-03-28 MED ORDER — ATORVASTATIN CALCIUM 40 MG PO TABS
40.0000 mg | ORAL_TABLET | Freq: Every day | ORAL | 1 refills | Status: DC
Start: 2020-03-28 — End: 2020-10-20

## 2020-03-28 MED ORDER — DIGOXIN 125 MCG PO TABS: 125.0000 ug | ORAL_TABLET | Freq: Every day | ORAL | 1 refills | Status: DC

## 2020-03-28 MED ORDER — THIAMINE HCL 100 MG PO TABS
100.0000 mg | ORAL_TABLET | Freq: Every day | ORAL | 1 refills | Status: DC
Start: 2020-03-28 — End: 2020-10-20

## 2020-03-28 MED ORDER — AMLODIPINE BESYLATE 5 MG PO TABS: 5.0000 mg | ORAL_TABLET | Freq: Every day | ORAL | 1 refills | Status: DC

## 2020-03-28 MED ORDER — FOLIC ACID 1 MG PO TABS
1.0000 mg | ORAL_TABLET | Freq: Every day | ORAL | 1 refills | Status: DC
Start: 2020-03-28 — End: 2020-10-30

## 2020-03-28 MED ORDER — BUDESONIDE-FORMOTEROL FUMARATE 160-4.5 MCG/ACT IN AERO
INHALATION_SPRAY | RESPIRATORY_TRACT | 1 refills | Status: DC
Start: 2020-03-28 — End: 2020-10-30

## 2020-03-28 MED ORDER — FUROSEMIDE 40 MG PO TABS
ORAL_TABLET | ORAL | 1 refills | Status: DC
Start: 2020-03-28 — End: 2020-10-20

## 2020-03-28 MED ORDER — METOPROLOL SUCCINATE ER 50 MG PO TB24
50.0000 mg | ORAL_TABLET | Freq: Every day | ORAL | 1 refills | Status: DC
Start: 2020-03-28 — End: 2020-10-20

## 2020-03-28 NOTE — Assessment & Plan Note (Signed)
Checking labs today. Await results. Treat as needed.  

## 2020-03-28 NOTE — Assessment & Plan Note (Signed)
Under good control with A1c of 6.5. Continue diet and exercise. Continue current regimen. Continue to monitor. Call with any concerns.

## 2020-03-28 NOTE — Assessment & Plan Note (Signed)
Not under good control. Will add amlodipine and recheck 1 month. Call with any concerns. Refills given today. Labs drawn today.

## 2020-03-28 NOTE — Assessment & Plan Note (Signed)
Has not seen his opthlamologist in 2 years. Referral put in today. Await their input.

## 2020-03-28 NOTE — Progress Notes (Signed)
BP (!) 169/125   Pulse (!) 113   Temp 99.3 F (37.4 C) (Oral)   Ht 6\' 1"  (1.854 m)   Wt 260 lb (117.9 kg)   SpO2 98%   BMI 34.30 kg/m    Subjective:    Patient ID: Eugene Henderson, male    DOB: 08-15-1950, 69 y.o.   MRN: [id number]  HPI: Eugene Henderson is a 69 y.o. male  Chief Complaint  Patient presents with  . Hypertension    Follow up.  . Hypothyroidism    Follow up.  . Diabetes    Follow up. Foot exam, A1C.   DIABETES Hypoglycemic episodes:no Polydipsia/polyuria: no Visual disturbance: no Chest pain: no Paresthesias: no Glucose Monitoring: no  Accucheck frequency: Not Checking Taking Insulin?: no Blood Pressure Monitoring: not checking Retinal Examination: Not up to Date Foot Exam: Not up to Date Diabetic Education: Completed Pneumovax: Up to Date Influenza: Up to Date Aspirin: yes  HYPERTENSION / HYPERLIPIDEMIA- has not been taking his medicine Satisfied with current treatment? yes Duration of hypertension: chronic BP monitoring frequency: not checking BP medication side effects: no Past BP meds: spironalactone, metoprolol, irbesartan, lasix, digoxin Duration of hyperlipidemia: chronic Cholesterol medication side effects: no Cholesterol supplements: none Past cholesterol medications: atorvastatin Medication compliance: excellent compliance Aspirin: no Recent stressors: no Recurrent headaches: no Visual changes: no Palpitations: no Dyspnea: no Chest pain: no Lower extremity edema: no Dizzy/lightheaded: no  Relevant past medical, surgical, family and social history reviewed and updated as indicated. Interim medical history since our last visit reviewed. Allergies and medications reviewed and updated.  Review of Systems  Constitutional: Negative.   HENT: Negative.   Eyes: Positive for discharge and visual disturbance. Negative for photophobia, pain, redness and itching.  Respiratory: Positive for shortness of breath. Negative for apnea, cough,  choking, chest tightness, wheezing and stridor.   Cardiovascular: Negative.   Gastrointestinal: Negative.   Musculoskeletal: Negative.   Neurological: Negative.   Psychiatric/Behavioral: Negative.     Per HPI unless specifically indicated above     Objective:    BP (!) 169/125   Pulse (!) 113   Temp 99.3 F (37.4 C) (Oral)   Ht 6\' 1"  (1.854 m)   Wt 260 lb (117.9 kg)   SpO2 98%   BMI 34.30 kg/m   Wt Readings from Last 3 Encounters:  03/28/20 260 lb (117.9 kg)  08/24/19 260 lb (117.9 kg)  12/11/18 280 lb 4 oz (127.1 kg)    Physical Exam Vitals and nursing note reviewed.  Constitutional:      General: He is not in acute distress.    Appearance: Normal appearance. He is not ill-appearing, toxic-appearing or diaphoretic.  HENT:     Head: Normocephalic and atraumatic.     Right Ear: External ear normal.     Left Ear: External ear normal.     Nose: Nose normal.     Mouth/Throat:     Mouth: Mucous membranes are moist.     Pharynx: Oropharynx is clear.  Eyes:     General: No scleral icterus.       Right eye: No discharge.        Left eye: No discharge.     Extraocular Movements: Extraocular movements intact.     Conjunctiva/sclera: Conjunctivae normal.     Pupils: Pupils are equal, round, and reactive to light.  Cardiovascular:     Rate and Rhythm: Normal rate and regular rhythm.     Pulses: Normal pulses.  Heart sounds: Normal heart sounds. No murmur heard.  No friction rub. No gallop.   Pulmonary:     Effort: Pulmonary effort is normal. No respiratory distress.     Breath sounds: Normal breath sounds. No stridor. No wheezing, rhonchi or rales.  Chest:     Chest wall: No tenderness.  Musculoskeletal:        General: Normal range of motion.     Cervical back: Normal range of motion and neck supple.  Skin:    General: Skin is warm and dry.     Capillary Refill: Capillary refill takes less than 2 seconds.     Coloration: Skin is not jaundiced or pale.      Findings: No bruising, erythema, lesion or rash.  Neurological:     General: No focal deficit present.     Mental Status: He is alert and oriented to person, place, and time. Mental status is at baseline.  Psychiatric:        Mood and Affect: Mood normal.        Behavior: Behavior normal.        Thought Content: Thought content normal.        Judgment: Judgment normal.     Results for orders placed or performed in visit on 03/28/20  Bayer DCA Hb A1c Waived  Result Value Ref Range   HB A1C (BAYER DCA - WAIVED) 6.5 <7.0 %  Microalbumin, Urine Waived  Result Value Ref Range   Microalb, Ur Waived 30 (H) 0 - 19 mg/L   Creatinine, Urine Waived 100 10 - 300 mg/dL   Microalb/Creat Ratio <30 <30 mg/g  Urinalysis, Routine w reflex microscopic  Result Value Ref Range   Specific Gravity, UA <1.005 (L) 1.005 - 1.030   pH, UA 5.5 5.0 - 7.5   Color, UA Yellow Yellow   Appearance Ur Clear Clear   Leukocytes,UA Negative Negative   Protein,UA Negative Negative/Trace   Glucose, UA 3+ (A) Negative   Ketones, UA Negative Negative   RBC, UA Negative Negative   Bilirubin, UA Negative Negative   Urobilinogen, Ur 2.0 (H) 0.2 - 1.0 mg/dL   Nitrite, UA Negative Negative      Assessment & Plan:   Problem List Items Addressed This Visit      Cardiovascular and Mediastinum   Essential hypertension    Not under good control. Will add amlodipine and recheck 1 month. Call with any concerns. Refills given today. Labs drawn today.       Relevant Medications   atorvastatin (LIPITOR) 40 MG tablet   digoxin (LANOXIN) 0.125 MG tablet   furosemide (LASIX) 40 MG tablet   irbesartan (AVAPRO) 150 MG tablet   metoprolol succinate (TOPROL-XL) 50 MG 24 hr tablet   spironolactone (ALDACTONE) 25 MG tablet   amLODipine (NORVASC) 5 MG tablet   Other Relevant Orders   CBC with Differential/Platelet   Comprehensive metabolic panel   Microalbumin, Urine Waived (Completed)   Urinalysis, Routine w reflex  microscopic (Completed)     Digestive   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)    Checking labs today. Await results. Treat as needed.       Relevant Orders   CBC with Differential/Platelet   Comprehensive metabolic panel   HCV RNA quant     Endocrine   Hyperthyroidism    Rechecking labs today. Await results. Treat as needed.       Relevant Medications   metoprolol succinate (TOPROL-XL) 50 MG 24 hr tablet  Other Relevant Orders   CBC with Differential/Platelet   Comprehensive metabolic panel   TSH   Diabetes mellitus without complication (HCC) - Primary    Under good control with A1c of 6.5. Continue diet and exercise. Continue current regimen. Continue to monitor. Call with any concerns.       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 Referral put in today. Await their input.       Relevant Orders   Ambulatory referral to Ophthalmology   Hyperlipidemia    Under good control on current regimen. Continue current regimen. Continue to monitor. Call with any concerns. Refills given. Labs drawn today.       Relevant Medications   atorvastatin (LIPITOR) 40 MG tablet   digoxin (LANOXIN) 0.125 MG tablet   furosemide (LASIX) 40 MG tablet   irbesartan (AVAPRO) 150 MG tablet   metoprolol succinate (TOPROL-XL) 50 MG 24 hr tablet   spironolactone (ALDACTONE) 25 MG tablet   amLODipine (NORVASC) 5 MG tablet   Other Relevant Orders   CBC with Differential/Platelet   Comprehensive metabolic panel   Lipid Panel w/o Chol/HDL Ratio    Other Visit Diagnoses     Hesitancy       Relevant Orders   PSA   Urinalysis, Routine w reflex microscopic (Completed)   Need for immunization against influenza       Relevant Orders   Flu Vaccine QUAD High Dose(Fluad) (Completed)       Follow up plan: Return in about 4 weeks (around 04/25/2020).

## 2020-03-28 NOTE — Assessment & Plan Note (Signed)
Rechecking labs today. Await results. Treat as needed.  °

## 2020-03-28 NOTE — Assessment & Plan Note (Signed)
Under good control on current regimen. Continue current regimen. Continue to monitor. Call with any concerns. Refills given. Labs drawn today.   

## 2020-04-02 LAB — COMPREHENSIVE METABOLIC PANEL
ALT: 9 IU/L (ref 0–44)
AST: 11 IU/L (ref 0–40)
Albumin/Globulin Ratio: 1.3 (ref 1.2–2.2)
Albumin: 4.8 g/dL (ref 3.8–4.8)
Alkaline Phosphatase: 114 IU/L (ref 44–121)
BUN/Creatinine Ratio: 12 (ref 10–24)
BUN: 11 mg/dL (ref 8–27)
Bilirubin Total: 1.2 mg/dL (ref 0.0–1.2)
CO2: 26 mmol/L (ref 20–29)
Calcium: 9.8 mg/dL (ref 8.6–10.2)
Chloride: 96 mmol/L (ref 96–106)
Creatinine, Ser: 0.94 mg/dL (ref 0.76–1.27)
GFR calc Af Amer: 96 mL/min/{1.73_m2} (ref >59)
GFR calc non Af Amer: 83 mL/min/{1.73_m2} (ref >59)
Globulin, Total: 3.6 g/dL (ref 1.5–4.5)
Glucose: 117 mg/dL — ABNORMAL HIGH (ref 65–99)
Potassium: 4.7 mmol/L (ref 3.5–5.2)
Sodium: 137 mmol/L (ref 134–144)
Total Protein: 8.4 g/dL (ref 6.0–8.5)

## 2020-04-02 LAB — CBC WITH DIFFERENTIAL/PLATELET
Basophils Absolute: 0 10*3/uL (ref 0.0–0.2)
Basos: 1 %
EOS (ABSOLUTE): 0.1 10*3/uL (ref 0.0–0.4)
Eos: 2 %
Hematocrit: 46.7 % (ref 37.5–51.0)
Hemoglobin: 15.6 g/dL (ref 13.0–17.7)
Immature Grans (Abs): 0 10*3/uL (ref 0.0–0.1)
Immature Granulocytes: 0 %
Lymphocytes Absolute: 1.6 10*3/uL (ref 0.7–3.1)
Lymphs: 36 %
MCH: 28.3 pg (ref 26.6–33.0)
MCHC: 33.4 g/dL (ref 31.5–35.7)
MCV: 85 fL (ref 79–97)
Monocytes Absolute: 0.5 10*3/uL (ref 0.1–0.9)
Monocytes: 11 %
Neutrophils Absolute: 2.2 10*3/uL (ref 1.4–7.0)
Neutrophils: 50 %
Platelets: 196 10*3/uL (ref 150–450)
RBC: 5.52 x10E6/uL (ref 4.14–5.80)
RDW: 13.5 % (ref 11.6–15.4)
WBC: 4.5 10*3/uL (ref 3.4–10.8)

## 2020-04-02 LAB — LIPID PANEL W/O CHOL/HDL RATIO
Cholesterol, Total: 103 mg/dL (ref 100–199)
HDL: 56 mg/dL (ref >39)
LDL Chol Calc (NIH): 32 mg/dL (ref 0–99)
Triglycerides: 72 mg/dL (ref 0–149)
VLDL Cholesterol Cal: 15 mg/dL (ref 5–40)

## 2020-04-02 LAB — HCV RNA QUANT: Hepatitis C Quantitation: NOT DETECTED IU/mL

## 2020-04-02 LAB — PSA: Prostate Specific Ag, Serum: 1.4 ng/mL (ref 0.0–4.0)

## 2020-04-02 LAB — TSH: TSH: 4.09 u[IU]/mL (ref 0.450–4.500)

## 2020-04-24 ENCOUNTER — Ambulatory Visit: Payer: Medicare HMO | Admitting: Family Medicine

## 2020-04-28 ENCOUNTER — Other Ambulatory Visit: Payer: Self-pay | Admitting: Family Medicine

## 2020-06-18 ENCOUNTER — Telehealth: Payer: Self-pay | Admitting: Family Medicine

## 2020-06-18 NOTE — Telephone Encounter (Signed)
Copied from CRM [id number]. Topic: Medicare AWV >> Jun 18, 2020 12:05 PM Geraldine Contras wrote: Reason for CRM:   Left message for patient that the AWVS scheduled on Jan 20,2022 had been cancelled and rescheduled to be completed by telephone with [facility] on Jun 23, 2020 at 9:45 am.

## 2020-06-19 ENCOUNTER — Other Ambulatory Visit: Payer: Self-pay | Admitting: Family Medicine

## 2020-06-23 ENCOUNTER — Telehealth: Payer: Self-pay

## 2020-06-23 ENCOUNTER — Ambulatory Visit: Payer: Medicare HMO

## 2020-06-23 NOTE — Telephone Encounter (Signed)
This nurse attempted to call patient in order to perform scheduled telephonic AWV. Daughter stated that she was not with the patient. We rescheduled for 07/04/2020.

## 2020-06-26 ENCOUNTER — Ambulatory Visit: Payer: Medicare HMO

## 2020-07-04 ENCOUNTER — Ambulatory Visit: Payer: Medicare HMO

## 2020-07-04 ENCOUNTER — Telehealth: Payer: Self-pay

## 2020-07-04 NOTE — Telephone Encounter (Signed)
This nurse attempted to call patient three times at 1255, 1300, and 1310 for scheduled telephonic AWV. Message left to call and reschedule for another time.

## 2020-07-11 ENCOUNTER — Ambulatory Visit (INDEPENDENT_AMBULATORY_CARE_PROVIDER_SITE_OTHER): Payer: Medicare HMO

## 2020-07-11 VITALS — Ht 72.0 in | Wt 260.0 lb

## 2020-07-11 DIAGNOSIS — Z0000 Encounter for general adult medical examination without abnormal findings: Secondary | ICD-10-CM

## 2020-07-11 NOTE — Patient Instructions (Signed)
Eugene Henderson , Thank you for taking time to come for your Medicare Wellness Visit. I appreciate your ongoing commitment to your health goals. Please review the following plan we discussed and let me know if I can assist you in the future.   Screening recommendations/referrals: Colonoscopy: completed 08/27/2019, due 08/26/2029 Recommended yearly ophthalmology/optometry visit for glaucoma screening and checkup Recommended yearly dental visit for hygiene and checkup  Vaccinations: Influenza vaccine: completed 03/28/2020, due 01/05/2021 Pneumococcal vaccine: completed 04/11/2018 Tdap vaccine: completed 05/09/2017, due 05/15/2027 Shingles vaccine: discussed   Covid-19:  Decline at this time  Advanced directives: Advance directive discussed with you today.   Conditions/risks identified: none  Next appointment: Follow up in one year for your annual wellness visit.   Preventive Care 6 Years and Older, Male Preventive care refers to lifestyle choices and visits with your health care provider that can promote health and wellness. What does preventive care include?  A yearly physical exam. This is also called an annual well check.  Dental exams once or twice a year.  Routine eye exams. Ask your health care provider how often you should have your eyes checked.  Personal lifestyle choices, including:  Daily care of your teeth and gums.  Regular physical activity.  Eating a healthy diet.  Avoiding tobacco and drug use.  Limiting alcohol use.  Practicing safe sex.  Taking low doses of aspirin every day.  Taking vitamin and mineral supplements as recommended by your health care provider. What happens during an annual well check? The services and screenings done by your health care provider during your annual well check will depend on your age, overall health, lifestyle risk factors, and family history of disease. Counseling  Your health care provider may ask you questions about  your:  Alcohol use.  Tobacco use.  Drug use.  Emotional well-being.  Home and relationship well-being.  Sexual activity.  Eating habits.  History of falls.  Memory and ability to understand (cognition).  Work and work Astronomer. Screening  You may have the following tests or measurements:  Height, weight, and BMI.  Blood pressure.  Lipid and cholesterol levels. These may be checked every 5 years, or more frequently if you are over 73 years old.  Skin check.  Lung cancer screening. You may have this screening every year starting at age 33 if you have a 30-pack-year history of smoking and currently smoke or have quit within the past 15 years.  Fecal occult blood test (FOBT) of the stool. You may have this test every year starting at age 53.  Flexible sigmoidoscopy or colonoscopy. You may have a sigmoidoscopy every 5 years or a colonoscopy every 10 years starting at age 5.  Prostate cancer screening. Recommendations will vary depending on your family history and other risks.  Hepatitis C blood test.  Hepatitis B blood test.  Sexually transmitted disease (STD) testing.  Diabetes screening. This is done by checking your blood sugar (glucose) after you have not eaten for a while (fasting). You may have this done every 1-3 years.  Abdominal aortic aneurysm (AAA) screening. You may need this if you are a current or former smoker.  Osteoporosis. You may be screened starting at age 41 if you are at high risk. Talk with your health care provider about your test results, treatment options, and if necessary, the need for more tests. Vaccines  Your health care provider may recommend certain vaccines, such as:  Influenza vaccine. This is recommended every year.  Tetanus, diphtheria, and  acellular pertussis (Tdap, Td) vaccine. You may need a Td booster every 10 years.  Zoster vaccine. You may need this after age 25.  Pneumococcal 13-valent conjugate (PCV13) vaccine.  One dose is recommended after age 28.  Pneumococcal polysaccharide (PPSV23) vaccine. One dose is recommended after age 54. Talk to your health care provider about which screenings and vaccines you need and how often you need them. This information is not intended to replace advice given to you by your health care provider. Make sure you discuss any questions you have with your health care provider. Document Released: 06/20/2015 Document Revised: 02/11/2016 Document Reviewed: 03/25/2015 Elsevier Interactive Patient Education  2017 [place] Prevention in the Home Falls can cause injuries. They can happen to people of all ages. There are many things you can do to make your home safe and to help prevent falls. What can I do on the outside of my home?  Regularly fix the edges of walkways and driveways and fix any cracks.  Remove anything that might make you trip as you walk through a door, such as a raised step or threshold.  Trim any bushes or trees on the path to your home.  Use bright outdoor lighting.  Clear any walking paths of anything that might make someone trip, such as rocks or tools.  Regularly check to see if handrails are loose or broken. Make sure that both sides of any steps have handrails.  Any raised decks and porches should have guardrails on the edges.  Have any leaves, snow, or ice cleared regularly.  Use sand or salt on walking paths during winter.  Clean up any spills in your garage right away. This includes oil or grease spills. What can I do in the bathroom?  Use night lights.  Install grab bars by the toilet and in the tub and shower. Do not use towel bars as grab bars.  Use non-skid mats or decals in the tub or shower.  If you need to sit down in the shower, use a plastic, non-slip stool.  Keep the floor dry. Clean up any water that spills on the floor as soon as it happens.  Remove soap buildup in the tub or shower regularly.  Attach bath  mats securely with double-sided non-slip rug tape.  Do not have throw rugs and other things on the floor that can make you trip. What can I do in the bedroom?  Use night lights.  Make sure that you have a light by your bed that is easy to reach.  Do not use any sheets or blankets that are too big for your bed. They should not hang down onto the floor.  Have a firm chair that has side arms. You can use this for support while you get dressed.  Do not have throw rugs and other things on the floor that can make you trip. What can I do in the kitchen?  Clean up any spills right away.  Avoid walking on wet floors.  Keep items that you use a lot in easy-to-reach places.  If you need to reach something above you, use a strong step stool that has a grab bar.  Keep electrical cords out of the way.  Do not use floor polish or wax that makes floors slippery. If you must use wax, use non-skid floor wax.  Do not have throw rugs and other things on the floor that can make you trip. What can I do with my stairs?  Do not leave any items on the stairs.  Make sure that there are handrails on both sides of the stairs and use them. Fix handrails that are broken or loose. Make sure that handrails are as long as the stairways.  Check any carpeting to make sure that it is firmly attached to the stairs. Fix any carpet that is loose or worn.  Avoid having throw rugs at the top or bottom of the stairs. If you do have throw rugs, attach them to the floor with carpet tape.  Make sure that you have a light switch at the top of the stairs and the bottom of the stairs. If you do not have them, ask someone to add them for you. What else can I do to help prevent falls?  Wear shoes that:  Do not have high heels.  Have rubber bottoms.  Are comfortable and fit you well.  Are closed at the toe. Do not wear sandals.  If you use a stepladder:  Make sure that it is fully opened. Do not climb a closed  stepladder.  Make sure that both sides of the stepladder are locked into place.  Ask someone to hold it for you, if possible.  Clearly mark and make sure that you can see:  Any grab bars or handrails.  First and last steps.  Where the edge of each step is.  Use tools that help you move around (mobility aids) if they are needed. These include:  Canes.  Walkers.  Scooters.  Crutches.  Turn on the lights when you go into a dark area. Replace any light bulbs as soon as they burn out.  Set up your furniture so you have a clear path. Avoid moving your furniture around.  If any of your floors are uneven, fix them.  If there are any pets around you, be aware of where they are.  Review your medicines with your doctor. Some medicines can make you feel dizzy. This can increase your chance of falling. Ask your doctor what other things that you can do to help prevent falls. This information is not intended to replace advice given to you by your health care provider. Make sure you discuss any questions you have with your health care provider. Document Released: 03/20/2009 Document Revised: 10/30/2015 Document Reviewed: 06/28/2014 Elsevier Interactive Patient Education  2017 [employer].

## 2020-07-11 NOTE — Progress Notes (Signed)
I connected with Eugene Henderson today by telephone and verified that I am speaking with the correct person using two identifiers. Location patient: home Location provider: work Persons participating in the virtual visit: Eugene Henderson, Eugene Henderson.   I discussed the limitations, risks, security and privacy concerns of performing an evaluation and management service by telephone and the availability of in person appointments. I also discussed with the patient that there may be a patient responsible charge related to this service. The patient expressed understanding and verbally consented to this telephonic visit.    Interactive audio and video telecommunications were attempted between this provider and patient, however failed, due to patient having technical difficulties OR patient did not have access to video capability.  We continued and completed visit with audio only.     Vital signs may be patient reported or missing.  Subjective:   Eugene Henderson is a 70 y.o. male who presents for Medicare Annual/Subsequent preventive examination.  Review of Systems     Cardiac Risk Factors include: advanced age (>67men, >17 women);diabetes mellitus;dyslipidemia;hypertension;male gender;obesity (BMI >30kg/m2);sedentary lifestyle     Objective:    Today's Vitals   07/11/20 1259 07/11/20 1301  Weight: 260 lb (117.9 kg)   Height: 6' (1.829 m)   PainSc:  7    Body mass index is 35.26 kg/m.  Advanced Directives 07/11/2020 08/25/2019 08/24/2019 06/21/2019 06/21/2019 03/13/2018 03/02/2018  Does Patient Have a Medical Advance Directive? No No No Yes Yes No No  Type of Advance Directive - - - Living will;Healthcare Power of Attorney - - -  Does patient want to make changes to medical advance directive? - - - - - - -  Copy of Healthcare Power of Attorney in Chart? - - - No - copy requested - - -  Would patient like information on creating a medical advance directive? - No - Patient declined No -  Patient declined - - - -    Current Medications (verified) Outpatient Encounter Medications as of 07/11/2020  Medication Sig  . acetaminophen (TYLENOL) 500 MG tablet Take 500-1,000 mg by mouth every 6 (six) hours as needed for mild pain, fever or headache.   . albuterol (PROVENTIL HFA;VENTOLIN HFA) 108 (90 Base) MCG/ACT inhaler Inhale 2 puffs into the lungs every 6 (six) hours as needed for wheezing or shortness of breath.  Marland Kitchen amLODipine (NORVASC) 5 MG tablet Take 1 tablet (5 mg total) by mouth daily.  Marland Kitchen atorvastatin (LIPITOR) 40 MG tablet Take 1 tablet (40 mg total) by mouth daily.  . budesonide-formoterol (SYMBICORT) 160-4.5 MCG/ACT inhaler TAKE 2 PUFFS BY MOUTH TWICE A DAY  . digoxin (LANOXIN) 0.125 MG tablet Take 1 tablet (125 mcg total) by mouth daily.  . folic acid (FOLVITE) 1 MG tablet Take 1 tablet (1 mg total) by mouth daily.  . furosemide (LASIX) 40 MG tablet TAKE 1 TABLET (40 MG TOTAL) BY MOUTH DAILY AS NEEDED. FOR SHORTNESS OF BREATH/LOWER EXTREMITY EDEMA  . hydrocortisone (ANUSOL-HC) 25 MG suppository One suppository twice a day as needed for hemmorhoids, (okay to substitute generic)  . irbesartan (AVAPRO) 150 MG tablet Take 1 tablet (150 mg total) by mouth daily.  Marland Kitchen KLOR-CON M20 20 MEQ tablet TAKE 1 TABLET BY MOUTH EVERY DAY  . metFORMIN (GLUCOPHAGE-XR) 500 MG 24 hr tablet TAKE 1 TABLET BY MOUTH EVERY DAY WITH BREAKFAST  . metoprolol succinate (TOPROL-XL) 50 MG 24 hr tablet Take 1 tablet (50 mg total) by mouth daily. Take with or immediately following a meal.  .  spironolactone (ALDACTONE) 25 MG tablet Take 1 tablet (25 mg total) by mouth daily.  Marland Kitchen thiamine 100 MG tablet Take 1 tablet (100 mg total) by mouth daily.   No facility-administered encounter medications on file as of 07/11/2020.    Allergies (verified) Patient has no known allergies.   History: Past Medical History:  Diagnosis Date  . Arthritis   . Ascending aortic aneurysm (HCC)    a. 09/2017 Stable TAA - 5.1cm.  .  Asthma   . Chronic systolic CHF (congestive heart failure) (HCC)    a. EF 25-30% by echo in 07/2016 with cath showing no significant CAD b. 01/2017: EF 30-35% with diffuse HK and moderate MR; c. 07/2017 Echo: EF 30-35%, diff hK. Mild MR, mildly dil LA. PASP .  . Diverticulitis   . Diverticulitis of large intestine with perforation without abscess or bleeding 05/13/2017  . Hypertension   . Hyperthyroidism   . NICM (nonischemic cardiomyopathy) (HCC)   . Noncompliance   . Persistent atrial fibrillation (HCC)    a. CHA2DS2VASc = 3-->Eliquis (? compliance).   Past Surgical History:  Procedure Laterality Date  . COLONOSCOPY N/A 08/27/2019   Procedure: COLONOSCOPY;  Surgeon: Toledo, Boykin Nearing, MD;  Location: [facility] ENDOSCOPY;  Service: Gastroenterology;  Laterality: N/A;  . JOINT REPLACEMENT Right   . RIGHT/LEFT HEART CATH AND CORONARY ANGIOGRAPHY N/A 08/02/2016   Procedure: Right/Left Heart Cath and Coronary Angiography;  Surgeon: Iran Ouch, MD;  Location: [facility] INVASIVE CV LAB;  Service: Cardiovascular;  Laterality: N/A;  . TESTICLE SURGERY     Patient states that he had to have the tube fixed.   Family History  Problem Relation Age of Onset  . Diabetes Mother   . Diabetes Father   . Heart disease Brother   . Heart attack Brother   . Diabetes Brother   . Kidney failure Brother   . Thyroid disease Neg Hx    Social History   Socioeconomic History  . Marital status: Single    Spouse name: Not on file  . Number of children: Not on file  . Years of education: Not on file  . Highest education level: Not on file  Occupational History  . Occupation: retired  Tobacco Use  . Smoking status: Current Some Day Smoker    Packs/day: 0.25    Years: 25.00    Pack years: 6.25    Types: Cigarettes  . Smokeless tobacco: Never Used  . Tobacco comment: pipe tobacco   Vaping Use  . Vaping Use: Never used  Substance and Sexual Activity  . Alcohol use: Yes    Comment: Socially - once a  month  . Drug use: No  . Sexual activity: Not Currently  Other Topics Concern  . Not on file  Social History Narrative  . Not on file   Social Determinants of Health   Financial Resource Strain: Low Risk   . Difficulty of Paying Living Expenses: Not hard at all  Food Insecurity: No Food Insecurity  . Worried About Programme researcher, broadcasting/film/video in the Last Year: Never true  . Ran Out of Food in the Last Year: Never true  Transportation Needs: No Transportation Needs  . Lack of Transportation (Medical): No  . Lack of Transportation (Non-Medical): No  Physical Activity: Insufficiently Active  . Days of Exercise per Week: 2 days  . Minutes of Exercise per Session: 20 min  Stress: No Stress Concern Present  . Feeling of Stress : Not at all  Social  Connections: Not on file    Tobacco Counseling Ready to quit: No Counseling given: Not Answered Comment: pipe tobacco    Clinical Intake:  Pre-visit preparation completed: Yes  Pain : 0-10 Pain Score: 7  Pain Type: Chronic pain Pain Location:  (back and right knee) Pain Descriptors / Indicators: Aching Pain Onset: More than a month ago Pain Frequency: Constant     Nutritional Status: BMI > 30  Obese Nutritional Risks: None Diabetes: Yes  How often do you need to have someone help you when you read instructions, pamphlets, or other written materials from your doctor or pharmacy?: 1 - Never What is the last grade level you completed in school?: 12th grade  Diabetic? Yes Nutrition Risk Assessment:  Has the patient had any N/V/D within the last 2 months?  No  Does the patient have any non-healing wounds?  No  Has the patient had any unintentional weight loss or weight gain?  No   Diabetes:  Is the patient diabetic?  Yes  If diabetic, was a CBG obtained today?  No  Did the patient bring in their glucometer from home?  No  How often do you monitor your CBG's? none.   Financial Strains and Diabetes Management:  Are you having  any financial strains with the device, your supplies or your medication? No .  Does the patient want to be seen by Chronic Care Management for management of their diabetes?  No  Would the patient like to be referred to a Nutritionist or for Diabetic Management?  No   Diabetic Exams:  Diabetic Eye Exam: Overdue for diabetic eye exam. Pt has been advised about the importance in completing this exam. Patient advised to call and schedule an eye exam. Diabetic Foot Exam: Overdue, Pt has been advised about the importance in completing this exam. Pt is scheduled for diabetic foot exam on next appointment.   Interpreter Needed?: No  Information entered by :: NAllen Henderson   Activities of Daily Living In your present state of health, do you have any difficulty performing the following activities: 07/11/2020 08/25/2019  Hearing? Y -  Comment feels like he needs hearing -  Vision? Y -  Comment can't see out of right eye -  Difficulty concentrating or making decisions? N -  Walking or climbing stairs? Y -  Dressing or bathing? N -  Doing errands, shopping? Y N  Comment daughter is with him -  Quarry manager and eating ? N -  Using the Toilet? N -  In the past six months, have you accidently leaked urine? N -  Do you have problems with loss of bowel control? N -  Managing your Medications? Y -  Comment daughter manages -  Managing your Finances? Y -  Housekeeping or managing your Housekeeping? Y -  Some recent data might be hidden    Patient Care Team: Dorcas Carrow, DO as PCP - General (Family Medicine) Iran Ouch, MD as PCP - Cardiology (Cardiology) Delma Freeze, FNP as Nurse Practitioner (Family Medicine) Iran Ouch, MD as Consulting Physician (Cardiology) Marlowe Sax, RN as Case Manager (General Practice)  Indicate any recent Medical Services you may have received from other than [facility] providers in the past year (date may be approximate).     Assessment:   This  is a routine wellness examination for Ranjit.  Hearing/Vision screen No exam data present  Dietary issues and exercise activities discussed: Current Exercise Habits: Home exercise routine, Type  of exercise: stretching, Time (Minutes): 20, Frequency (Times/Week): 2, Weekly Exercise (Minutes/Week): 40  Goals    .  Diabetes management (pt-stated)      Current Barriers:  Marland Kitchen Knowledge Deficits related to basic Diabetes pathophysiology and self care/management . Financial Constraints  Case Manager Clinical Goal(s):  Over the next 90 days, patient will demonstrate improved adherence to prescribed treatment plan for diabetes self care/management as evidenced by:  . adherence to ADA/ carb modified diet  Interventions:  . Provided education to patient about basic DM disease process . Discussed plans with patient for ongoing care management follow up and provided patient with direct contact information for care management team  . Daughter Toniann Fail stated patient has stopped putting sugar in coffee and trying to do better with his diet. . Patient not using a glucometer at this time related to he was sharing with daughter and theirs broke . Collaborated with PCP to request a script for new meter. . Daughter states patient could really benefit from meals on wheels related to financial strain. She is the care giver and has her own health issues . C-3 referral for Meals on Wheels  . Pharmacy referral, patient with no glucometer and polypharmacy   Patient Self Care Activities:  . UNABLE to independently adhere to ADA/Carb modified diet  Please see past updates related to this goal by clicking on the "Past Updates" button in the selected goal       .  Patient Stated      07/11/2020, no goals    .  PharmD "He has a lot of medications" (pt-stated)      Current Barriers:  . Polypharmacy; complex patient with multiple comorbidities including CHF, Afib, HTN, COPD, T2DM, hyperthyroidism, Hep C; Daughter,  Toniann Fail, helps manage medications and medical conditions o CHF/Afib/HTN/ASCVD risk reduction: follows with Dr. Kirke Corin; appointment w/ NP Dunn on 02/25/2001; Entresto 24/26 mg BID, metoprolol tartrate 25 mg BID, digoxin 0.125 mg daily. furosemide 40 mg daily, potassium 20 mEq daily; atorvastatin 40 mg daily o COPD: Symbicort 160/4.5 mg 2 puffs BID, albuterol HFA PRN o T2DM: metformin 500 mg daily; Toniann Fail requests a glucometer prescription  o Hyperthyroidism: methimazole 20 mg BID o Hep C: Epclusa 400/100 mg daily  Pharmacist Clinical Goal(s):  Marland Kitchen Over the next 90 days, patient will work with PharmD and provider towards optimized medication management  Interventions: . Toniann Fail was out and declined a medication review day. Denied any medication related questions or concerns. Notes that patient has follow up with cardiology later this month, and she will address any concerns then . Will alert Dr. Laural Benes to the glucometer request. Patient likely does not need to check regularly d/t metformin monotherapy.   Patient Self Care Activities:  . Patient will take medications as prescribed  Initial goal documentation       Depression Screen PHQ 2/9 Scores 07/11/2020 03/28/2020 06/21/2019 05/16/2018 10/04/2017 07/05/2017 02/21/2017  PHQ - 2 Score 0 0 4 1 0 1 0  PHQ- 9 Score - 0 19 7 - - -    Fall Risk Fall Risk  07/11/2020 03/28/2020 06/21/2019 05/16/2018 04/11/2018  Falls in the past year? 0 0 0 1 0  Number falls in past yr: - 0 0 0 -  Injury with Fall? - 0 0 0 -  Risk for fall due to : Impaired balance/gait;Impaired mobility;Medication side effect History of fall(s);Impaired balance/gait - - -  Follow up Falls evaluation completed;Education provided;Falls prevention discussed Falls evaluation completed - Falls evaluation completed  Falls evaluation completed    FALL RISK PREVENTION PERTAINING TO THE HOME:  Any stairs in or around the home? Yes  If so, are there any without handrails? No  Home free of loose  throw rugs in walkways, pet beds, electrical cords, etc? Yes  Adequate lighting in your home to reduce risk of falls? Yes   ASSISTIVE DEVICES UTILIZED TO PREVENT FALLS:  Life alert? No  Use of a cane, walker or w/c? Yes  Grab bars in the bathroom? Yes  Shower chair or bench in shower? Yes  Elevated toilet seat or a handicapped toilet? No   TIMED UP AND GO:  Was the test performed? No .    Cognitive Function:     6CIT Screen 07/11/2020 05/16/2018  What Year? 0 points 0 points  What month? 0 points 0 points  What time? 0 points 0 points  Count back from 20 0 points 0 points  Months in reverse 2 points 2 points  Repeat phrase 2 points 2 points  Total Score 4 4    Immunizations Immunization History  Administered Date(s) Administered  . Fluad Quad(high Dose 65+) 03/28/2020  . Hepatitis A, Adult 04/11/2018  . Hepatitis B, adult 04/11/2018, 05/16/2018  . Influenza, High Dose Seasonal PF 05/09/2017, 04/11/2018  . Influenza-Unspecified 02/07/2017  . Pneumococcal Conjugate-13 04/11/2018  . Pneumococcal Polysaccharide-23 08/02/2016  . Td 05/09/2017    TDAP status: Up to date  Flu Vaccine status: Up to date  Pneumococcal vaccine status: Up to date  Covid-19 vaccine status: Declined, Education has been provided regarding the importance of this vaccine but patient still declined. Advised may receive this vaccine at local pharmacy or Health Dept.or vaccine clinic. Aware to provide a copy of the vaccination record if obtained from local pharmacy or Health Dept. Verbalized acceptance and understanding.  Qualifies for Shingles Vaccine? Yes   Zostavax completed No   Shingrix Completed?: No.    Education has been provided regarding the importance of this vaccine. Patient has been advised to call insurance company to determine out of pocket expense if they have not yet received this vaccine. Advised may also receive vaccine at local pharmacy or Health Dept. Verbalized acceptance and  understanding.  Screening Tests Health Maintenance  Topic Date Due  . COVID-19 Vaccine (1) Never done  . OPHTHALMOLOGY EXAM  Never done  . FOOT EXAM  04/12/2019  . HEMOGLOBIN A1C  09/26/2020  . TETANUS/TDAP  05/10/2027  . COLONOSCOPY (Pts [id number] Insurance coverage will need to be confirmed)  08/26/2029  . INFLUENZA VACCINE  Completed  . Hepatitis C Screening  Completed  . PNA vac Low Risk Adult  Completed    Health Maintenance  Health Maintenance Due  Topic Date Due  . COVID-19 Vaccine (1) Never done  . OPHTHALMOLOGY EXAM  Never done  . FOOT EXAM  04/12/2019    Colorectal cancer screening: Type of screening: Colonoscopy. Completed 08/27/2019. Repeat every 10 years  Lung Cancer Screening: (Low Dose CT Chest recommended if Age 38-80 years, 30 pack-year currently smoking OR have quit w/in 15years.) does not qualify.   Lung Cancer Screening Referral: no  Additional Screening:  Hepatitis C Screening: does qualify; Completed 03/28/2020  Vision Screening: Recommended annual ophthalmology exams for early detection of glaucoma and other disorders of the eye. Is the patient up to date with their annual eye exam?  No  Who is the provider or what is the name of the office in which the patient attends annual eye exams? [facility] If pt is not established with a provider, would they like to be referred to a provider to establish care? No .   Dental Screening: Recommended annual dental exams for proper oral hygiene  Community Resource Referral / Chronic Care Management: CRR required this visit?  No   CCM required this visit?  No      Plan:     I have personally reviewed and noted the following in the patient's chart:   . Medical and social history . Use of alcohol, tobacco or illicit drugs  . Current medications and supplements . Functional ability and status . Nutritional status . Physical activity . Advanced directives . List of other  physicians . Hospitalizations, surgeries, and ER visits in previous 12 months . Vitals . Screenings to include cognitive, depression, and falls . Referrals and appointments  In addition, I have reviewed and discussed with patient certain preventive protocols, quality metrics, and best practice recommendations. A written personalized care plan for preventive services as well as general preventive health recommendations were provided to patient.     Barb Merino, Henderson   02/07/7341   Nurse Notes:

## 2020-08-27 DIAGNOSIS — I700 Atherosclerosis of aorta: Secondary | ICD-10-CM | POA: Insufficient documentation

## 2020-09-11 ENCOUNTER — Ambulatory Visit: Payer: Medicare HMO | Admitting: Family Medicine

## 2020-09-25 ENCOUNTER — Telehealth: Payer: Self-pay

## 2020-09-25 NOTE — Telephone Encounter (Signed)
Wendi pt's daughter presented in office to see about getting home health services due to her wife changing hours at work. Please advise.

## 2020-09-25 NOTE — Telephone Encounter (Signed)
Needs an appointment to get home health

## 2020-09-25 NOTE — Telephone Encounter (Signed)
Pt verbalized understanding.

## 2020-10-16 ENCOUNTER — Ambulatory Visit: Payer: Medicare HMO | Admitting: Family Medicine

## 2020-10-20 ENCOUNTER — Other Ambulatory Visit: Payer: Self-pay | Admitting: Family Medicine

## 2020-10-27 ENCOUNTER — Ambulatory Visit: Payer: Medicare HMO | Admitting: Family Medicine

## 2020-10-30 ENCOUNTER — Encounter: Payer: Self-pay | Admitting: Family Medicine

## 2020-10-30 ENCOUNTER — Other Ambulatory Visit: Payer: Self-pay | Admitting: Family Medicine

## 2020-10-30 ENCOUNTER — Other Ambulatory Visit: Payer: Self-pay

## 2020-10-30 ENCOUNTER — Ambulatory Visit (INDEPENDENT_AMBULATORY_CARE_PROVIDER_SITE_OTHER): Payer: Medicare HMO | Admitting: Family Medicine

## 2020-10-30 VITALS — BP 144/89 | HR 63 | Temp 98.2°F | Wt 288.2 lb

## 2020-10-30 DIAGNOSIS — E119 Type 2 diabetes mellitus without complications: Secondary | ICD-10-CM

## 2020-10-30 DIAGNOSIS — I5022 Chronic systolic (congestive) heart failure: Secondary | ICD-10-CM | POA: Diagnosis not present

## 2020-10-30 DIAGNOSIS — I480 Paroxysmal atrial fibrillation: Secondary | ICD-10-CM

## 2020-10-30 DIAGNOSIS — E785 Hyperlipidemia, unspecified: Secondary | ICD-10-CM | POA: Diagnosis not present

## 2020-10-30 DIAGNOSIS — J42 Unspecified chronic bronchitis: Secondary | ICD-10-CM

## 2020-10-30 DIAGNOSIS — I4819 Other persistent atrial fibrillation: Secondary | ICD-10-CM | POA: Diagnosis not present

## 2020-10-30 DIAGNOSIS — I10 Essential (primary) hypertension: Secondary | ICD-10-CM

## 2020-10-30 DIAGNOSIS — I714 Abdominal aortic aneurysm, without rupture, unspecified: Secondary | ICD-10-CM

## 2020-10-30 DIAGNOSIS — I700 Atherosclerosis of aorta: Secondary | ICD-10-CM

## 2020-10-30 DIAGNOSIS — B351 Tinea unguium: Secondary | ICD-10-CM

## 2020-10-30 DIAGNOSIS — B182 Chronic viral hepatitis C: Secondary | ICD-10-CM | POA: Diagnosis not present

## 2020-10-30 DIAGNOSIS — E0590 Thyrotoxicosis, unspecified without thyrotoxic crisis or storm: Secondary | ICD-10-CM

## 2020-10-30 DIAGNOSIS — J449 Chronic obstructive pulmonary disease, unspecified: Secondary | ICD-10-CM | POA: Diagnosis not present

## 2020-10-30 DIAGNOSIS — R3911 Hesitancy of micturition: Secondary | ICD-10-CM | POA: Diagnosis not present

## 2020-10-30 LAB — URINALYSIS, ROUTINE W REFLEX MICROSCOPIC
Bilirubin, UA: NEGATIVE
Glucose, UA: NEGATIVE
Ketones, UA: NEGATIVE
Nitrite, UA: NEGATIVE
RBC, UA: NEGATIVE
Specific Gravity, UA: 1.02 (ref 1.005–1.030)
Urobilinogen, Ur: 0.2 mg/dL (ref 0.2–1.0)
pH, UA: 5.5 (ref 5.0–7.5)

## 2020-10-30 LAB — MICROSCOPIC EXAMINATION
Bacteria, UA: NONE SEEN
Epithelial Cells (non renal): NONE SEEN /hpf (ref 0–10)
RBC, Urine: NONE SEEN /hpf (ref 0–2)

## 2020-10-30 LAB — MICROALBUMIN, URINE WAIVED
Creatinine, Urine Waived: 300 mg/dL (ref 10–300)
Microalb, Ur Waived: 80 mg/L — ABNORMAL HIGH (ref 0–19)
Microalb/Creat Ratio: <30 mg/g (ref <30)

## 2020-10-30 LAB — BAYER DCA HB A1C WAIVED: HB A1C (BAYER DCA - WAIVED): 7.2 % — ABNORMAL HIGH (ref <7.0)

## 2020-10-30 MED ORDER — BUDESONIDE-FORMOTEROL FUMARATE 160-4.5 MCG/ACT IN AERO: INHALATION_SPRAY | RESPIRATORY_TRACT | 1 refills | Status: DC

## 2020-10-30 MED ORDER — POTASSIUM CHLORIDE CRYS ER 20 MEQ PO TBCR: 20.0000 meq | EXTENDED_RELEASE_TABLET | Freq: Every day | ORAL | 0 refills | Status: DC

## 2020-10-30 MED ORDER — IRBESARTAN 150 MG PO TABS: 150.0000 mg | ORAL_TABLET | Freq: Every day | ORAL | 1 refills | Status: DC

## 2020-10-30 MED ORDER — FUROSEMIDE 40 MG PO TABS: 40.0000 mg | ORAL_TABLET | Freq: Every day | ORAL | 1 refills | Status: DC

## 2020-10-30 MED ORDER — METFORMIN HCL ER 500 MG PO TB24: 500.0000 mg | ORAL_TABLET | Freq: Two times a day (BID) | ORAL | 1 refills | Status: DC

## 2020-10-30 MED ORDER — ATORVASTATIN CALCIUM 40 MG PO TABS: 1.0000 | ORAL_TABLET | Freq: Every day | ORAL | 1 refills | Status: DC

## 2020-10-30 MED ORDER — THIAMINE HCL 100 MG PO TABS: 100.0000 mg | ORAL_TABLET | Freq: Every day | ORAL | 1 refills | Status: DC

## 2020-10-30 MED ORDER — AMLODIPINE BESYLATE 5 MG PO TABS: 1.0000 | ORAL_TABLET | Freq: Every day | ORAL | 1 refills | Status: DC

## 2020-10-30 MED ORDER — SPIRONOLACTONE 25 MG PO TABS: 1.0000 | ORAL_TABLET | Freq: Every day | ORAL | 1 refills | Status: DC

## 2020-10-30 MED ORDER — METOPROLOL SUCCINATE ER 50 MG PO TB24: 50.0000 mg | ORAL_TABLET | Freq: Every day | ORAL | 1 refills | Status: DC

## 2020-10-30 MED ORDER — FOLIC ACID 1 MG PO TABS: 1.0000 mg | ORAL_TABLET | Freq: Every day | ORAL | 1 refills | Status: DC

## 2020-10-30 MED ORDER — DIGOXIN 125 MCG PO TABS: 125.0000 ug | ORAL_TABLET | Freq: Every day | ORAL | 1 refills | Status: DC

## 2020-10-30 MED ORDER — HYDROCORTISONE ACETATE 25 MG RE SUPP: RECTAL | 1 refills | Status: DC

## 2020-10-30 MED ORDER — ALBUTEROL SULFATE HFA 108 (90 BASE) MCG/ACT IN AERS: 2.0000 | INHALATION_SPRAY | Freq: Four times a day (QID) | RESPIRATORY_TRACT | 3 refills | Status: DC | PRN

## 2020-10-30 NOTE — Assessment & Plan Note (Signed)
Did not take his medicine today. Will refill and get him back in for recheck in 1 month. Call with any concerns.

## 2020-10-30 NOTE — Assessment & Plan Note (Signed)
Has not seen his cardiologist. Will get him back in. Rate controlled today. Continue to monitor. Call with any concerns.

## 2020-10-30 NOTE — Assessment & Plan Note (Signed)
Under good control on current regimen. Continue current regimen. Continue to monitor. Call with any concerns. Refills given.   

## 2020-10-30 NOTE — Assessment & Plan Note (Signed)
Has not seen his cardiologist. Will get him back in. Rate controlled today. Continue to monitor. Call with any concerns.  

## 2020-10-30 NOTE — Assessment & Plan Note (Signed)
Going in the wrong direction with A1c of 7.2- will increase his metformin to BID and recheck tolerance in 1 month. Call with any concerns.

## 2020-10-30 NOTE — Assessment & Plan Note (Signed)
Will keep BP and cholesterol and sugar under good control. Continue to monitor. Call with any concerns.  

## 2020-10-30 NOTE — Progress Notes (Signed)
BP (!) 144/89   Pulse 63   Temp 98.2 F (36.8 C)   Wt 288 lb 3.2 oz (130.7 kg)   SpO2 98%   BMI 39.09 kg/m    Subjective:    Patient ID: Eugene Henderson, male    DOB: 09-Feb-1951, 70 y.o.   MRN: [id number]  HPI: Eugene Henderson is a 70 y.o. male  Chief Complaint  Patient presents with  . Hypertension  . Depression   DIABETES Hypoglycemic episodes:no Polydipsia/polyuria: no Visual disturbance: no Chest pain: no Paresthesias: no Glucose Monitoring: no  Accucheck frequency: Not Checking Taking Insulin?: no Blood Pressure Monitoring: not checking Retinal Examination: Not up to Date Foot Exam: Done today Diabetic Education: Completed Pneumovax: Up to Date Influenza: Up to Date Aspirin: yes  HYPERTENSION / HYPERLIPIDEMIA- did not take his medicine today Satisfied with current treatment? yes Duration of hypertension: chronic BP monitoring frequency: not checking BP medication side effects: no Duration of hyperlipidemia: chronic Cholesterol medication side effects: no Cholesterol supplements: none Past cholesterol medications: atorvastatin Medication compliance: fair compliance Aspirin: yes Recent stressors: no Recurrent headaches: no Visual changes: no Palpitations: no Dyspnea: no Chest pain: no Lower extremity edema: no Dizzy/lightheaded: no  Relevant past medical, surgical, family and social history reviewed and updated as indicated. Interim medical history since our last visit reviewed. Allergies and medications reviewed and updated.  Review of Systems  Constitutional: Negative.   Respiratory: Negative.   Cardiovascular: Negative.   Gastrointestinal: Negative.   Musculoskeletal: Negative.   Neurological: Negative.   Psychiatric/Behavioral: Negative.     Per HPI unless specifically indicated above     Objective:    BP (!) 144/89   Pulse 63   Temp 98.2 F (36.8 C)   Wt 288 lb 3.2 oz (130.7 kg)   SpO2 98%   BMI 39.09 kg/m   Wt Readings from  Last 3 Encounters:  10/30/20 288 lb 3.2 oz (130.7 kg)  07/11/20 260 lb (117.9 kg)  03/28/20 260 lb (117.9 kg)    Physical Exam Vitals and nursing note reviewed.  Constitutional:      General: He is not in acute distress.    Appearance: Normal appearance. He is not ill-appearing, toxic-appearing or diaphoretic.  HENT:     Head: Normocephalic and atraumatic.     Right Ear: External ear normal.     Left Ear: External ear normal.     Nose: Nose normal.     Mouth/Throat:     Mouth: Mucous membranes are moist.     Pharynx: Oropharynx is clear.  Eyes:     General: No scleral icterus.       Right eye: No discharge.        Left eye: No discharge.     Extraocular Movements: Extraocular movements intact.     Conjunctiva/sclera: Conjunctivae normal.     Pupils: Pupils are equal, round, and reactive to light.  Cardiovascular:     Rate and Rhythm: Normal rate and regular rhythm.     Pulses: Normal pulses.     Heart sounds: Normal heart sounds. No murmur heard. No friction rub. No gallop.   Pulmonary:     Effort: Pulmonary effort is normal. No respiratory distress.     Breath sounds: Normal breath sounds. No stridor. No wheezing, rhonchi or rales.  Chest:     Chest wall: No tenderness.  Musculoskeletal:        General: Normal range of motion.     Cervical back: Normal range of motion  and neck supple.  Skin:    General: Skin is warm and dry.     Capillary Refill: Capillary refill takes less than 2 seconds.     Coloration: Skin is not jaundiced or pale.     Findings: No bruising, erythema, lesion or rash.     Comments: Thick, black toenails  Neurological:     General: No focal deficit present.     Mental Status: He is alert and oriented to person, place, and time. Mental status is at baseline.  Psychiatric:        Mood and Affect: Mood normal.        Behavior: Behavior normal.        Thought Content: Thought content normal.        Judgment: Judgment normal.     Results for  orders placed or performed in visit on 03/28/20  Bayer DCA Hb A1c Waived  Result Value Ref Range   HB A1C (BAYER DCA - WAIVED) 6.5 <7.0 %  CBC with Differential/Platelet  Result Value Ref Range   WBC 4.5 3.4 - 10.8 x10E3/uL   RBC 5.52 4.14 - 5.80 x10E6/uL   Hemoglobin 15.6 13.0 - 17.7 g/dL   Hematocrit 02.446.7 09.737.5 - 51.0 %   MCV 85 79 - 97 fL   MCH 28.3 26.6 - 33.0 pg   MCHC 33.4 31.5 - 35.7 g/dL   RDW 35.313.5 29.911.6 - 24.215.4 %   Platelets 196 150 - 450 x10E3/uL   Neutrophils 50 Not Estab. %   Lymphs 36 Not Estab. %   Monocytes 11 Not Estab. %   Eos 2 Not Estab. %   Basos 1 Not Estab. %   Neutrophils Absolute 2.2 1.4 - 7.0 x10E3/uL   Lymphocytes Absolute 1.6 0.7 - 3.1 x10E3/uL   Monocytes Absolute 0.5 0.1 - 0.9 x10E3/uL   EOS (ABSOLUTE) 0.1 0.0 - 0.4 x10E3/uL   Basophils Absolute 0.0 0.0 - 0.2 x10E3/uL   Immature Granulocytes 0 Not Estab. %   Immature Grans (Abs) 0.0 0.0 - 0.1 x10E3/uL  Comprehensive metabolic panel  Result Value Ref Range   Glucose 117 (H) 65 - 99 mg/dL   BUN 11 8 - 27 mg/dL   Creatinine, Ser 6.830.94 0.76 - 1.27 mg/dL   GFR calc non Af Amer 83 >59 mL/min/1.73   GFR calc Af Amer 96 >59 mL/min/1.73   BUN/Creatinine Ratio 12 10 - 24   Sodium 137 134 - 144 mmol/L   Potassium 4.7 3.5 - 5.2 mmol/L   Chloride 96 96 - 106 mmol/L   CO2 26 20 - 29 mmol/L   Calcium 9.8 8.6 - 10.2 mg/dL   Total Protein 8.4 6.0 - 8.5 g/dL   Albumin 4.8 3.8 - 4.8 g/dL   Globulin, Total 3.6 1.5 - 4.5 g/dL   Albumin/Globulin Ratio 1.3 1.2 - 2.2   Bilirubin Total 1.2 0.0 - 1.2 mg/dL   Alkaline Phosphatase 114 44 - 121 IU/L   AST 11 0 - 40 IU/L   ALT 9 0 - 44 IU/L  Lipid Panel w/o Chol/HDL Ratio  Result Value Ref Range   Cholesterol, Total 103 100 - 199 mg/dL   Triglycerides 72 0 - 149 mg/dL   HDL 56 >41>39 mg/dL   VLDL Cholesterol Cal 15 5 - 40 mg/dL   LDL Chol Calc (NIH) 32 0 - 99 mg/dL  Microalbumin, Urine Waived  Result Value Ref Range   Microalb, Ur Waived 30 (H) 0 - 19 mg/L    Creatinine,  Urine Waived 100 10 - 300 mg/dL   Microalb/Creat Ratio <30 <30 mg/g  PSA  Result Value Ref Range   Prostate Specific Ag, Serum 1.4 0.0 - 4.0 ng/mL  TSH  Result Value Ref Range   TSH 4.090 0.450 - 4.500 uIU/mL  Urinalysis, Routine w reflex microscopic  Result Value Ref Range   Specific Gravity, UA <1.005 (L) 1.005 - 1.030   pH, UA 5.5 5.0 - 7.5   Color, UA Yellow Yellow   Appearance Ur Clear Clear   Leukocytes,UA Negative Negative   Protein,UA Negative Negative/Trace   Glucose, UA 3+ (A) Negative   Ketones, UA Negative Negative   RBC, UA Negative Negative   Bilirubin, UA Negative Negative   Urobilinogen, Ur 2.0 (H) 0.2 - 1.0 mg/dL   Nitrite, UA Negative Negative  HCV RNA quant  Result Value Ref Range   Hepatitis C Quantitation HCV Not Detected IU/mL   Test Information Comment       Assessment & Plan:   Problem List Items Addressed This Visit      Cardiovascular and Mediastinum   Chronic systolic heart failure (HCC) (Chronic)    Has not seen his cardiologist. Will get him back in. Euvolemic today. Continue to monitor. Call with any concerns.       Relevant Medications   amLODipine (NORVASC) 5 MG tablet   atorvastatin (LIPITOR) 40 MG tablet   digoxin (LANOXIN) 0.125 MG tablet   furosemide (LASIX) 40 MG tablet   irbesartan (AVAPRO) 150 MG tablet   metoprolol succinate (TOPROL-XL) 50 MG 24 hr tablet   spironolactone (ALDACTONE) 25 MG tablet   Other Relevant Orders   Ambulatory referral to Cardiology   Persistent atrial fibrillation [facility])    Has not seen his cardiologist. Will get him back in. Rate controlled today. Continue to monitor. Call with any concerns.       Relevant Medications   amLODipine (NORVASC) 5 MG tablet   atorvastatin (LIPITOR) 40 MG tablet   digoxin (LANOXIN) 0.125 MG tablet   furosemide (LASIX) 40 MG tablet   irbesartan (AVAPRO) 150 MG tablet   metoprolol succinate (TOPROL-XL) 50 MG 24 hr tablet   spironolactone (ALDACTONE) 25 MG  tablet   Other Relevant Orders   Ambulatory referral to Cardiology   Essential hypertension    Did not take his medicine today. Will refill and get him back in for recheck in 1 month. Call with any concerns.       Relevant Medications   amLODipine (NORVASC) 5 MG tablet   atorvastatin (LIPITOR) 40 MG tablet   digoxin (LANOXIN) 0.125 MG tablet   furosemide (LASIX) 40 MG tablet   irbesartan (AVAPRO) 150 MG tablet   metoprolol succinate (TOPROL-XL) 50 MG 24 hr tablet   spironolactone (ALDACTONE) 25 MG tablet   Other Relevant Orders   Comprehensive metabolic panel   CBC with Differential/Platelet   Ambulatory referral to Cardiology   AAA (abdominal aortic aneurysm) (HCC)    He has not seen vascular. Will get him back in. Keep BP, cholesterol and sugar under good control. Call with any concerns.       Relevant Medications   amLODipine (NORVASC) 5 MG tablet   atorvastatin (LIPITOR) 40 MG tablet   digoxin (LANOXIN) 0.125 MG tablet   furosemide (LASIX) 40 MG tablet   irbesartan (AVAPRO) 150 MG tablet   metoprolol succinate (TOPROL-XL) 50 MG 24 hr tablet   spironolactone (ALDACTONE) 25 MG tablet   Other Relevant Orders   Ambulatory referral  to Cardiology   Ambulatory referral to Vascular Surgery   AF (paroxysmal atrial fibrillation) (HCC)    Has not seen his cardiologist. Will get him back in. Rate controlled today. Continue to monitor. Call with any concerns.       Relevant Medications   amLODipine (NORVASC) 5 MG tablet   atorvastatin (LIPITOR) 40 MG tablet   digoxin (LANOXIN) 0.125 MG tablet   furosemide (LASIX) 40 MG tablet   irbesartan (AVAPRO) 150 MG tablet   metoprolol succinate (TOPROL-XL) 50 MG 24 hr tablet   spironolactone (ALDACTONE) 25 MG tablet   Other Relevant Orders   Ambulatory referral to Cardiology   Aortic atherosclerosis (HCC)    Will keep BP and cholesterol and sugar under good control. Continue to monitor. Call with any concerns.       Relevant  Medications   amLODipine (NORVASC) 5 MG tablet   atorvastatin (LIPITOR) 40 MG tablet   digoxin (LANOXIN) 0.125 MG tablet   furosemide (LASIX) 40 MG tablet   irbesartan (AVAPRO) 150 MG tablet   metoprolol succinate (TOPROL-XL) 50 MG 24 hr tablet   spironolactone (ALDACTONE) 25 MG tablet   Other Relevant Orders   Ambulatory referral to Cardiology     Respiratory   COPD (chronic obstructive pulmonary disease) (HCC)    Under good control on current regimen. Continue current regimen. Continue to monitor. Call with any concerns. Refills given.        Relevant Medications   albuterol (VENTOLIN HFA) 108 (90 Base) MCG/ACT inhaler   budesonide-formoterol (SYMBICORT) 160-4.5 MCG/ACT inhaler     Digestive   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)    S/p treatment. Checking CMP today. Await results.         Endocrine   Hyperthyroidism    Rechecking labs today. Await results. Treat as needed.       Relevant Medications   metoprolol succinate (TOPROL-XL) 50 MG 24 hr tablet   Other Relevant Orders   Comprehensive metabolic panel   CBC with Differential/Platelet   TSH   Diabetes mellitus without complication (HCC) - Primary    Going in the wrong direction with A1c of 7.2- will increase his metformin to BID and recheck tolerance in 1 month. Call with any concerns.       Relevant Medications   metFORMIN (GLUCOPHAGE-XR) 500 MG 24 hr tablet   atorvastatin (LIPITOR) 40 MG tablet   irbesartan (AVAPRO) 150 MG tablet   Other Relevant Orders   Bayer DCA Hb A1c Waived   Comprehensive metabolic panel   CBC with Differential/Platelet   Microalbumin, Urine Waived   Ambulatory referral to Podiatry   Ambulatory referral to Ophthalmology     Other   Morbid obesity (HCC)    Will increase his metformin. Encouraged diet and exercise with goal of losing 1-2 lbs per week.       Relevant Medications   metFORMIN (GLUCOPHAGE-XR) 500 MG 24 hr tablet   Hyperlipidemia    Under good control  on current regimen. Continue current regimen. Continue to monitor. Call with any concerns. Refills given. Labs drawn today.       Relevant Medications   amLODipine (NORVASC) 5 MG tablet   atorvastatin (LIPITOR) 40 MG tablet   digoxin (LANOXIN) 0.125 MG tablet   furosemide (LASIX) 40 MG tablet   irbesartan (AVAPRO) 150 MG tablet   metoprolol succinate (TOPROL-XL) 50 MG 24 hr tablet   spironolactone (ALDACTONE) 25 MG tablet   Other Relevant Orders   Comprehensive metabolic  panel   CBC with Differential/Platelet   Lipid Panel w/o Chol/HDL Ratio    Other Visit Diagnoses    Hesitancy       Labs drawn today. Await results.    Relevant Orders   Comprehensive metabolic panel   CBC with Differential/Platelet   PSA   Urinalysis, Routine w reflex microscopic   Onychomycosis       Will refer to podiatry.    Relevant Orders   Ambulatory referral to Podiatry       Follow up plan: Return in about 4 weeks (around 11/27/2020).

## 2020-10-30 NOTE — Telephone Encounter (Signed)
Requested medication (s) are due for refill today: yes  Requested medication (s) are on the active medication list: yes  Last refill:    Future visit scheduled: yes  Notes to clinic:  no assigned protocol; Alternative Requested:NOT COVERED BY INSURNACE.   Requested Prescriptions  Pending Prescriptions Disp Refills   hydrocortisone (ANUSOL-HC) 25 MG suppository [Pharmacy Med Name: HYDROCORTISONE AC 25 MG SUPP] 24 suppository 1    Sig: One suppository twice a day as needed for hemmorhoids, (okay to substitute generic)      Off-Protocol Failed - 10/30/2020 12:31 PM      Failed - Medication not assigned to a protocol, review manually.      Passed - Valid encounter within last 12 months    Recent Outpatient Visits           Today Diabetes mellitus without complication [facility])   [facility] [place], Megan P, DO   7 months ago Diabetes mellitus without complication [facility])   [facility] [place], Megan P, DO   1 year ago Essential hypertension   [facility] [place], [place], DO   1 year ago Diabetes mellitus without complication [facility])   [facility] [place], [place], DO   2 years ago Hyperthyroidism   [facility] [place], Oralia Rud, DO       Future Appointments             In 4 weeks La[place], Oralia Rud, DO [facility], [facility]   In 8 months  [facility], [facility]

## 2020-10-30 NOTE — Assessment & Plan Note (Signed)
Under good control on current regimen. Continue current regimen. Continue to monitor. Call with any concerns. Refills given. Labs drawn today.   

## 2020-10-30 NOTE — Assessment & Plan Note (Signed)
Has not seen his cardiologist. Will get him back in. Euvolemic today. Continue to monitor. Call with any concerns.  

## 2020-10-30 NOTE — Telephone Encounter (Signed)
Scheduled 6/23

## 2020-10-30 NOTE — Assessment & Plan Note (Signed)
He has not seen vascular. Will get him back in. Keep BP, cholesterol and sugar under good control. Call with any concerns.

## 2020-10-30 NOTE — Assessment & Plan Note (Signed)
Will increase his metformin. Encouraged diet and exercise with goal of losing 1-2 lbs per week.

## 2020-10-30 NOTE — Assessment & Plan Note (Signed)
Rechecking labs today. Await results. Treat as needed.  °

## 2020-10-30 NOTE — Assessment & Plan Note (Signed)
Has not seen his cardiologist. Will get him back in. Euvolemic today. Continue to monitor. Call with any concerns.

## 2020-10-30 NOTE — Assessment & Plan Note (Signed)
S/p treatment. Checking CMP today. Await results.

## 2020-10-31 LAB — CBC WITH DIFFERENTIAL/PLATELET
Basophils Absolute: 0 10*3/uL (ref 0.0–0.2)
Basos: 1 %
EOS (ABSOLUTE): 0.2 10*3/uL (ref 0.0–0.4)
Eos: 3 %
Hematocrit: 46.2 % (ref 37.5–51.0)
Hemoglobin: 15.4 g/dL (ref 13.0–17.7)
Immature Grans (Abs): 0 10*3/uL (ref 0.0–0.1)
Immature Granulocytes: 0 %
Lymphocytes Absolute: 2.2 10*3/uL (ref 0.7–3.1)
Lymphs: 39 %
MCH: 29.6 pg (ref 26.6–33.0)
MCHC: 33.3 g/dL (ref 31.5–35.7)
MCV: 89 fL (ref 79–97)
Monocytes Absolute: 0.6 10*3/uL (ref 0.1–0.9)
Monocytes: 11 %
Neutrophils Absolute: 2.6 10*3/uL (ref 1.4–7.0)
Neutrophils: 46 %
Platelets: 197 10*3/uL (ref 150–450)
RBC: 5.21 x10E6/uL (ref 4.14–5.80)
RDW: 13.5 % (ref 11.6–15.4)
WBC: 5.6 10*3/uL (ref 3.4–10.8)

## 2020-10-31 LAB — TSH: TSH: 1.83 u[IU]/mL (ref 0.450–4.500)

## 2020-10-31 LAB — COMPREHENSIVE METABOLIC PANEL
ALT: 14 IU/L (ref 0–44)
AST: 13 IU/L (ref 0–40)
Albumin/Globulin Ratio: 1.5 (ref 1.2–2.2)
Albumin: 4.7 g/dL (ref 3.8–4.8)
Alkaline Phosphatase: 119 IU/L (ref 44–121)
BUN/Creatinine Ratio: 10 (ref 10–24)
BUN: 7 mg/dL — ABNORMAL LOW (ref 8–27)
Bilirubin Total: 0.6 mg/dL (ref 0.0–1.2)
CO2: 21 mmol/L (ref 20–29)
Calcium: 9.5 mg/dL (ref 8.6–10.2)
Chloride: 100 mmol/L (ref 96–106)
Creatinine, Ser: 0.7 mg/dL — ABNORMAL LOW (ref 0.76–1.27)
Globulin, Total: 3.2 g/dL (ref 1.5–4.5)
Glucose: 129 mg/dL — ABNORMAL HIGH (ref 65–99)
Potassium: 4.1 mmol/L (ref 3.5–5.2)
Sodium: 139 mmol/L (ref 134–144)
Total Protein: 7.9 g/dL (ref 6.0–8.5)
eGFR: 100 mL/min/{1.73_m2} (ref >59)

## 2020-10-31 LAB — LIPID PANEL W/O CHOL/HDL RATIO
Cholesterol, Total: 95 mg/dL — ABNORMAL LOW (ref 100–199)
HDL: 58 mg/dL (ref >39)
LDL Chol Calc (NIH): 20 mg/dL (ref 0–99)
Triglycerides: 82 mg/dL (ref 0–149)
VLDL Cholesterol Cal: 17 mg/dL (ref 5–40)

## 2020-10-31 LAB — PSA: Prostate Specific Ag, Serum: 1.4 ng/mL (ref 0.0–4.0)

## 2020-11-17 ENCOUNTER — Other Ambulatory Visit: Payer: Self-pay

## 2020-11-17 ENCOUNTER — Ambulatory Visit (INDEPENDENT_AMBULATORY_CARE_PROVIDER_SITE_OTHER): Payer: Medicare HMO | Admitting: Vascular Surgery

## 2020-11-17 ENCOUNTER — Ambulatory Visit (INDEPENDENT_AMBULATORY_CARE_PROVIDER_SITE_OTHER): Payer: Medicare HMO | Admitting: Cardiology

## 2020-11-17 ENCOUNTER — Encounter: Payer: Self-pay | Admitting: Cardiology

## 2020-11-17 VITALS — BP 120/88 | HR 78 | Ht 73.0 in | Wt 288.0 lb

## 2020-11-17 VITALS — BP 132/84 | HR 99 | Ht 73.0 in | Wt 288.0 lb

## 2020-11-17 DIAGNOSIS — I10 Essential (primary) hypertension: Secondary | ICD-10-CM

## 2020-11-17 DIAGNOSIS — I714 Abdominal aortic aneurysm, without rupture, unspecified: Secondary | ICD-10-CM

## 2020-11-17 DIAGNOSIS — I713 Abdominal aortic aneurysm, ruptured, unspecified: Secondary | ICD-10-CM

## 2020-11-17 DIAGNOSIS — I77810 Thoracic aortic ectasia: Secondary | ICD-10-CM

## 2020-11-17 DIAGNOSIS — F17200 Nicotine dependence, unspecified, uncomplicated: Secondary | ICD-10-CM | POA: Diagnosis not present

## 2020-11-17 DIAGNOSIS — I5020 Unspecified systolic (congestive) heart failure: Secondary | ICD-10-CM

## 2020-11-17 DIAGNOSIS — J42 Unspecified chronic bronchitis: Secondary | ICD-10-CM

## 2020-11-17 DIAGNOSIS — I4821 Permanent atrial fibrillation: Secondary | ICD-10-CM

## 2020-11-17 DIAGNOSIS — K922 Gastrointestinal hemorrhage, unspecified: Secondary | ICD-10-CM

## 2020-11-17 DIAGNOSIS — I480 Paroxysmal atrial fibrillation: Secondary | ICD-10-CM | POA: Diagnosis not present

## 2020-11-17 MED ORDER — METOPROLOL SUCCINATE ER 100 MG PO TB24: 100.0000 mg | ORAL_TABLET | Freq: Every day | ORAL | 5 refills | Status: DC

## 2020-11-17 NOTE — Progress Notes (Signed)
Cardiology Office Note:    Date:  11/17/2020   ID:  [name], DOB Jun 30, 1950, MRN [id number]  PCP:  Dorcas Carrow, DO   [facility] Providers Cardiologist:  Lorine Bears, MD     Referring MD: Dorcas Carrow, DO   Chief Complaint  Patient presents with   New Patient (Initial Visit)    Referred by PCP for AFIB, HTN, CHF. Meds reviewed verbally with patient.     History of Present Illness:    Eugene Henderson is a 70 y.o. male with a hx of nonischemic cardiomyopathy, EF 30 to 35%, persistent A. fib, hypertension,  severe ascending aortic aneurysm, COPD, current smoker who presents to reestablish care.  He was lost to follow-up, last seen in 2019.  Has a history of persistent atrial fibrillation, previously was on Eliquis, this was stopped after he was admitted 08/2019 with GI bleed.  He required blood transfusion, colonoscopy showed diverticular disease.  Did not follow-up with GI or cardiology upon discharge.  States taking medications as prescribed, complains of being on too many medicines.  Denies edema, still smokes but is working on quitting.  Echocardiogram 07/19/2017, EF 30 to 35% Left heart cath 07/2016 no significant CAD CT chest 09/2017 ascending aortic aneurysm measuring 5.1 cm  Past Medical History:  Diagnosis Date   Arthritis    Ascending aortic aneurysm (HCC)    a. 09/2017 Stable TAA - 5.1cm.   Asthma    Chronic systolic CHF (congestive heart failure) (HCC)    a. EF 25-30% by echo in 07/2016 with cath showing no significant CAD b. 01/2017: EF 30-35% with diffuse HK and moderate MR; c. 07/2017 Echo: EF 30-35%, diff hK. Mild MR, mildly dil LA. PASP .   Diverticulitis    Diverticulitis of large intestine with perforation without abscess or bleeding 05/13/2017   Hypertension    Hyperthyroidism    NICM (nonischemic cardiomyopathy) (HCC)    Noncompliance    Persistent atrial fibrillation (HCC)    a. CHA2DS2VASc = 3-->Eliquis (? compliance).    Past  Surgical History:  Procedure Laterality Date   COLONOSCOPY N/A 08/27/2019   Procedure: COLONOSCOPY;  Surgeon: Toledo, Boykin Nearing, MD;  Location: [facility] ENDOSCOPY;  Service: Gastroenterology;  Laterality: N/A;   JOINT REPLACEMENT Right    RIGHT/LEFT HEART CATH AND CORONARY ANGIOGRAPHY N/A 08/02/2016   Procedure: Right/Left Heart Cath and Coronary Angiography;  Surgeon: Iran Ouch, MD;  Location: [facility] INVASIVE CV LAB;  Service: Cardiovascular;  Laterality: N/A;   TESTICLE SURGERY     Patient states that he had to have the tube fixed.    Current Medications: Current Meds  Medication Sig   acetaminophen (TYLENOL) 500 MG tablet Take 500-1,000 mg by mouth every 6 (six) hours as needed for mild pain, fever or headache.    albuterol (VENTOLIN HFA) 108 (90 Base) MCG/ACT inhaler Inhale 2 puffs into the lungs every 6 (six) hours as needed for wheezing or shortness of breath.   atorvastatin (LIPITOR) 40 MG tablet Take 1 tablet (40 mg total) by mouth daily.   budesonide-formoterol (SYMBICORT) 160-4.5 MCG/ACT inhaler TAKE 2 PUFFS BY MOUTH TWICE A DAY   folic acid (FOLVITE) 1 MG tablet Take 1 tablet (1 mg total) by mouth daily.   furosemide (LASIX) 40 MG tablet Take 1 tablet (40 mg total) by mouth daily.   hydrocortisone (ANUSOL-HC) 25 MG suppository One suppository twice a day as needed for hemmorhoids, (okay to substitute generic)   irbesartan (AVAPRO) 150 MG tablet Take  1 tablet (150 mg total) by mouth daily.   metFORMIN (GLUCOPHAGE-XR) 500 MG 24 hr tablet Take 1 tablet (500 mg total) by mouth in the morning and at bedtime.   potassium chloride SA (KLOR-CON M20) 20 MEQ tablet Take 1 tablet (20 mEq total) by mouth daily.   spironolactone (ALDACTONE) 25 MG tablet Take 1 tablet (25 mg total) by mouth daily.   thiamine 100 MG tablet Take 1 tablet (100 mg total) by mouth daily.   [DISCONTINUED] amLODipine (NORVASC) 5 MG tablet Take 1 tablet (5 mg total) by mouth daily.   [DISCONTINUED] digoxin (LANOXIN)  0.125 MG tablet Take 1 tablet (125 mcg total) by mouth daily.   [DISCONTINUED] metoprolol succinate (TOPROL-XL) 50 MG 24 hr tablet Take 1 tablet (50 mg total) by mouth daily. TAKE WITH OR IMMEDIATELY FOLLOWING A MEAL.     Allergies:   Patient has no known allergies.   Social History   Socioeconomic History   Marital status: Single    Spouse name: Not on file   Number of children: Not on file   Years of education: Not on file   Highest education level: Not on file  Occupational History   Occupation: retired  Tobacco Use   Smoking status: Some Days    Packs/day: 0.25    Years: 25.00    Pack years: 6.25    Types: Cigarettes   Smokeless tobacco: Never   Tobacco comments:    pipe tobacco   Vaping Use   Vaping Use: Never used  Substance and Sexual Activity   Alcohol use: Yes    Comment: Socially - once a month   Drug use: No   Sexual activity: Not Currently  Other Topics Concern   Not on file  Social History Narrative   Not on file   Social Determinants of Health   Financial Resource Strain: Low Risk    Difficulty of Paying Living Expenses: Not hard at all  Food Insecurity: No Food Insecurity   Worried About Programme researcher, broadcasting/film/video in the Last Year: Never true   Ran Out of Food in the Last Year: Never true  Transportation Needs: No Transportation Needs   Lack of Transportation (Medical): No   Lack of Transportation (Non-Medical): No  Physical Activity: Insufficiently Active   Days of Exercise per Week: 2 days   Minutes of Exercise per Session: 20 min  Stress: No Stress Concern Present   Feeling of Stress : Not at all  Social Connections: Not on file     Family History: The patient's family history includes Diabetes in his brother, father, and mother; Heart attack in his brother; Heart disease in his brother; Kidney failure in his brother. There is no history of Thyroid disease.  ROS:   Please see the history of present illness.     All other systems reviewed and are  negative.  EKGs/Labs/Other Studies Reviewed:    The following studies were reviewed today:   EKG:  EKG is  ordered today.  The ekg ordered today demonstrates atrial fibrillation, heart rate 99  Recent Labs: 10/30/2020: ALT 14; BUN 7; Creatinine, Ser 0.70; Hemoglobin 15.4; Platelets 197; Potassium 4.1; Sodium 139; TSH 1.830  Recent Lipid Panel    Component Value Date/Time   CHOL 95 (L) 10/30/2020 1135   TRIG 82 10/30/2020 1135   HDL 58 10/30/2020 1135   LDLCALC 20 10/30/2020 1135     Risk Assessment/Calculations:      Physical Exam:    VS:  BP 132/84 (BP Location: Right Arm, Patient Position: Sitting, Cuff Size: Normal)   Pulse 99   Ht 6\' 1"  (1.854 m)   Wt 288 lb (130.6 kg)   SpO2 96%   BMI 38.00 kg/m     Wt Readings from Last 3 Encounters:  11/17/20 288 lb (130.6 kg)  10/30/20 288 lb 3.2 oz (130.7 kg)  07/11/20 260 lb (117.9 kg)     GEN:  Well nourished, well developed in no acute distress HEENT: Normal NECK: No JVD; No carotid bruits LYMPHATICS: No lymphadenopathy CARDIAC: Irregular irregular, no murmurs RESPIRATORY: Diminished breath sounds at bases ABDOMEN: Soft, non-tender, non-distended MUSCULOSKELETAL:  No edema; No deformity  SKIN: Warm and dry NEUROLOGIC:  Alert and oriented x 3 PSYCHIATRIC:  Normal affect   ASSESSMENT:    1. Heart failure with reduced ejection fraction (HCC)   2. Ascending aorta dilatation (HCC)   3. Permanent atrial fibrillation (HCC)   4. Primary hypertension   5. Smoking   6. Gastrointestinal hemorrhage, unspecified gastrointestinal hemorrhage type    PLAN:    In order of problems listed above:  Nonischemic cardiomyopathy, EF 30 to 35%.  Increase Toprol-XL to 100 mg daily, continue irbesartan, Aldactone, Lasix.  Repeat echocardiogram. Severe ascending aortic dilatation measuring 5.1 cm.  Referred to CT surgery for input. Permanent A. fib, not on anticoagulation due to GI bleed.  CHA2DS2-VASc of 4 (chf, htn age, dm)   recommend follow-up with GI regarding recommendations to resume Eliquis. Hypertension, BP controlled.  Increase Toprol-XL to 100 mg.  Continue Aldactone, irbesartan.  Stop digoxin, amlodipine.  If BP not controlled, plan to increase irbesartan dose. Smoking, cessation advised.  Follow-up after echocardiogram.  Plans to follow-up with Dr. 09/08/20 or APP.      Medication Adjustments/Labs and Tests Ordered: Current medicines are reviewed at length with the patient today.  Concerns regarding medicines are outlined above.  Orders Placed This Encounter  Procedures   Ambulatory referral to Gastroenterology   Ambulatory referral to Cardiothoracic Surgery   EKG 12-Lead   ECHOCARDIOGRAM COMPLETE    Meds ordered this encounter  Medications   metoprolol succinate (TOPROL-XL) 100 MG 24 hr tablet    Sig: Take 1 tablet (100 mg total) by mouth daily. TAKE WITH OR IMMEDIATELY FOLLOWING A MEAL.    Dispense:  30 tablet    Refill:  5     Patient Instructions  Medication Instructions:   Your physician has recommended you make the following change in your medication:    STOP taking your Norvasc (Amlodipine)  STOP taking your Digoxin.  INCREASE your Toprol XL to 100 MG once a day.  *If you need a refill on your cardiac medications before your next appointment, please call your pharmacy*   Lab Work: None Ordered If you have labs (blood work) drawn today and your tests are completely normal, you will receive your results only by: MyChart Message (if you have MyChart) OR A paper copy in the mail If you have any lab test that is abnormal or we need to change your treatment, we will call you to review the results.   Testing/Procedures:  Your physician has requested that you have an echocardiogram. Echocardiography is a painless test that uses sound waves to create images of your heart. It provides your doctor with information about the size and shape of your heart and how well your heart's  chambers and valves are working. This procedure takes approximately one hour. There are no restrictions for this procedure.  Follow-Up: At [facility], you and your health needs are our priority.  As part of our continuing mission to provide you with exceptional heart care, we have created designated Provider Care Teams.  These Care Teams include your primary Cardiologist (physician) and Advanced Practice Providers (APPs -  Physician Assistants and Nurse Practitioners) who all work together to provide you with the care you need, when you need it.  We recommend signing up for the patient portal called "MyChart".  Sign up information is provided on this After Visit Summary.  MyChart is used to connect with patients for Virtual Visits (Telemedicine).  Patients are able to view lab/test results, encounter notes, upcoming appointments, etc.  Non-urgent messages can be sent to your provider as well.   To learn more about what you can do with MyChart, go to [email or web address].    Your next appointment:   Follow up after Echo   The format for your next appointment:   In Person  Provider:   You may see Lorine Bears, MD or one of the following Advanced Practice Providers on your designated Care Team:   Nicolasa Ducking, NP Eula Listen, PA-C Marisue Ivan, PA-C Cadence [place], [place] Gillian Shields, NP   Other Instructions    Signed, Debbe Odea, MD  11/17/2020 12:26 PM    [facility]

## 2020-11-17 NOTE — Patient Instructions (Signed)
Medication Instructions:   Your physician has recommended you make the following change in your medication:    STOP taking your Norvasc (Amlodipine)  STOP taking your Digoxin.  INCREASE your Toprol XL to 100 MG once a day.  *If you need a refill on your cardiac medications before your next appointment, please call your pharmacy*   Lab Work: None Ordered If you have labs (blood work) drawn today and your tests are completely normal, you will receive your results only by: MyChart Message (if you have MyChart) OR A paper copy in the mail If you have any lab test that is abnormal or we need to change your treatment, we will call you to review the results.   Testing/Procedures:  Your physician has requested that you have an echocardiogram. Echocardiography is a painless test that uses sound waves to create images of your heart. It provides your doctor with information about the size and shape of your heart and how well your heart's chambers and valves are working. This procedure takes approximately one hour. There are no restrictions for this procedure.    Follow-Up: At [facility], you and your health needs are our priority.  As part of our continuing mission to provide you with exceptional heart care, we have created designated Provider Care Teams.  These Care Teams include your primary Cardiologist (physician) and Advanced Practice Providers (APPs -  Physician Assistants and Nurse Practitioners) who all work together to provide you with the care you need, when you need it.  We recommend signing up for the patient portal called "MyChart".  Sign up information is provided on this After Visit Summary.  MyChart is used to connect with patients for Virtual Visits (Telemedicine).  Patients are able to view lab/test results, encounter notes, upcoming appointments, etc.  Non-urgent messages can be sent to your provider as well.   To learn more about what you can do with MyChart, go to  [email or web address].    Your next appointment:   Follow up after Echo   The format for your next appointment:   In Person  Provider:   You may see Lorine Bears, MD or one of the following Advanced Practice Providers on your designated Care Team:   Nicolasa Ducking, NP Eula Listen, PA-C Marisue Ivan, PA-C Cadence [place], [place] Gillian Shields, NP   Other Instructions

## 2020-11-18 ENCOUNTER — Telehealth: Payer: Self-pay

## 2020-11-18 DIAGNOSIS — R1907 Generalized intra-abdominal and pelvic swelling, mass and lump: Secondary | ICD-10-CM

## 2020-11-18 DIAGNOSIS — I714 Abdominal aortic aneurysm, without rupture, unspecified: Secondary | ICD-10-CM

## 2020-11-18 NOTE — Telephone Encounter (Signed)
-----   Message from Brian Agbor-Etang, MD sent at 11/18/2020 10:43 AM EDT ----- Bastien Strawser,   please a new ct chest and abd aorta w/ and without contrast as per CT surgery  Thks BA ----- Message ----- From: McGee, Cynthia A Sent: 11/17/2020   2:36 PM EDT To: Brian Agbor-Etang, MD  Our office received referral on this patient however we do need an updated CT Chest so the surgeon will have new images.  Can your office get this order and scheduled and we will then set up appointment at our office.  Thank you  Cindy TCTS   

## 2020-11-18 NOTE — Telephone Encounter (Signed)
-----   Message from Debbe Odea, MD sent at 11/18/2020 10:43 AM EDT ----- Doy Hutching,   please a new ct chest and abd aorta w/ and without contrast as per CT surgery  Thks BA ----- Message ----- From: Roe Coombs Sent: 11/17/2020   2:36 PM EDT To: Debbe Odea, MD  Our office received referral on this patient however we do need an updated CT Chest so the surgeon will have new images.  Can your office get this order and scheduled and we will then set up appointment at our office.  Thank you  Cindy TCTS

## 2020-11-19 ENCOUNTER — Other Ambulatory Visit: Payer: Self-pay

## 2020-11-19 ENCOUNTER — Ambulatory Visit (INDEPENDENT_AMBULATORY_CARE_PROVIDER_SITE_OTHER): Payer: Medicare HMO | Admitting: Podiatry

## 2020-11-19 ENCOUNTER — Encounter: Payer: Self-pay | Admitting: Podiatry

## 2020-11-19 DIAGNOSIS — M79674 Pain in right toe(s): Secondary | ICD-10-CM

## 2020-11-19 DIAGNOSIS — B351 Tinea unguium: Secondary | ICD-10-CM

## 2020-11-19 DIAGNOSIS — B353 Tinea pedis: Secondary | ICD-10-CM | POA: Diagnosis not present

## 2020-11-19 DIAGNOSIS — M79675 Pain in left toe(s): Secondary | ICD-10-CM

## 2020-11-19 DIAGNOSIS — E119 Type 2 diabetes mellitus without complications: Secondary | ICD-10-CM | POA: Diagnosis not present

## 2020-11-19 NOTE — Progress Notes (Signed)
  Subjective:  Patient ID: Eugene Henderson, male    DOB: 1951-06-04,  MRN: [id number]  Chief Complaint  Patient presents with   Nail Problem   Diabetes    Nail trim [facility].   He c/o nail fungus all toes and athletes feet.  He says his feet and between his toes itch and sting at times    70 y.o. male presents with the above complaint. History confirmed with patient.   Objective:  Physical Exam: warm, good capillary refill, no trophic changes or ulcerative lesions, normal DP and PT pulses, normal monofilament exam, and normal sensory exam.  Thickened elongated yellowed brown discolored toenails with several debris and tinea pedis scaling moccasin distribution and interdigitally Assessment:   1. Pain due to onychomycosis of toenails of both feet   2. Tinea pedis of both feet   3. Diabetes mellitus without complication (HCC)      Plan:  Patient was evaluated and treated and all questions answered.  Patient educated on diabetes. Discussed proper diabetic foot care and discussed risks and complications of disease. Educated patient in depth on reasons to return to the office immediately should he/she discover anything concerning or new on the feet. All questions answered. Discussed proper shoes as well.   Discussed the etiology and treatment options for tinea pedis.  Discussed topical and oral treatment.  Recommended topical treatment with 2% ketoconazole cream.  This was sent to the patient's pharmacy.  Also discussed appropriate foot hygiene, use of antifungal spray such as Tinactin in shoes, as well as cleaning her foot surfaces such as showers and bathroom floors with bleach.  Discussed the etiology and treatment options for the condition in detail with the patient. Educated patient on the topical and oral treatment options for mycotic nails. Recommended debridement of the nails today. Sharp and mechanical debridement performed of all painful and mycotic nails today. Nails debrided in length  and thickness using a nail nipper to level of comfort. Discussed treatment options including appropriate shoe gear. Follow up as needed for painful nails.    No follow-ups on file.

## 2020-11-20 ENCOUNTER — Encounter (INDEPENDENT_AMBULATORY_CARE_PROVIDER_SITE_OTHER): Payer: Self-pay | Admitting: Vascular Surgery

## 2020-11-20 NOTE — Progress Notes (Signed)
MRN : [id number]  Damarie Schoolfield is a 70 y.o. (16-Dec-1950) male who presents with chief complaint of  Chief Complaint  Patient presents with   New Patient (Initial Visit)    NP AAA W/O rupture referred by Olevia Perches  .  History of Present Illness:   The patient presents to the office for evaluation of an abdominal aortic aneurysm. The aneurysm was found incidentally by CT scan. Patient denies abdominal pain or unusual back pain, no other abdominal complaints.  No history of an acute onset of painful blue discoloration of the toes.     No family history of AAA.   Patient denies amaurosis fugax or TIA symptoms. There is no history of claudication or rest pain symptoms of the lower extremities.  The patient denies angina or shortness of breath.  CT scan shows an AAA that measures >5.00 cm   Current Meds  Medication Sig   acetaminophen (TYLENOL) 500 MG tablet Take 500-1,000 mg by mouth every 6 (six) hours as needed for mild pain, fever or headache.    albuterol (VENTOLIN HFA) 108 (90 Base) MCG/ACT inhaler Inhale 2 puffs into the lungs every 6 (six) hours as needed for wheezing or shortness of breath.   atorvastatin (LIPITOR) 40 MG tablet Take 1 tablet (40 mg total) by mouth daily.   budesonide-formoterol (SYMBICORT) 160-4.5 MCG/ACT inhaler TAKE 2 PUFFS BY MOUTH TWICE A DAY   folic acid (FOLVITE) 1 MG tablet Take 1 tablet (1 mg total) by mouth daily.   furosemide (LASIX) 40 MG tablet Take 1 tablet (40 mg total) by mouth daily.   hydrocortisone (ANUSOL-HC) 25 MG suppository One suppository twice a day as needed for hemmorhoids, (okay to substitute generic)   irbesartan (AVAPRO) 150 MG tablet Take 1 tablet (150 mg total) by mouth daily.   metFORMIN (GLUCOPHAGE-XR) 500 MG 24 hr tablet Take 1 tablet (500 mg total) by mouth in the morning and at bedtime.   metoprolol succinate (TOPROL-XL) 100 MG 24 hr tablet Take 1 tablet (100 mg total) by mouth daily. TAKE WITH OR IMMEDIATELY FOLLOWING A  MEAL.   potassium chloride SA (KLOR-CON M20) 20 MEQ tablet Take 1 tablet (20 mEq total) by mouth daily.   spironolactone (ALDACTONE) 25 MG tablet Take 1 tablet (25 mg total) by mouth daily.   thiamine 100 MG tablet Take 1 tablet (100 mg total) by mouth daily.    Past Medical History:  Diagnosis Date   Arthritis    Ascending aortic aneurysm (HCC)    a. 09/2017 Stable TAA - 5.1cm.   Asthma    Chronic systolic CHF (congestive heart failure) (HCC)    a. EF 25-30% by echo in 07/2016 with cath showing no significant CAD b. 01/2017: EF 30-35% with diffuse HK and moderate MR; c. 07/2017 Echo: EF 30-35%, diff hK. Mild MR, mildly dil LA. PASP .   Diverticulitis    Diverticulitis of large intestine with perforation without abscess or bleeding 05/13/2017   Hypertension    Hyperthyroidism    NICM (nonischemic cardiomyopathy) (HCC)    Noncompliance    Persistent atrial fibrillation (HCC)    a. CHA2DS2VASc = 3-->Eliquis (? compliance).    Past Surgical History:  Procedure Laterality Date   COLONOSCOPY N/A 08/27/2019   Procedure: COLONOSCOPY;  Surgeon: Toledo, Boykin Nearing, MD;  Location: [facility];  Service: Gastroenterology;  Laterality: N/A;   JOINT REPLACEMENT Right    RIGHT/LEFT HEART CATH AND CORONARY ANGIOGRAPHY N/A 08/02/2016   Procedure: Right/Left Heart Cath  and Coronary Angiography;  Surgeon: Iran Ouch, MD;  Location: [facility];  Service: Cardiovascular;  Laterality: N/A;   TESTICLE SURGERY     Patient states that he had to have the tube fixed.    Social History Social History   Tobacco Use   Smoking status: Some Days    Packs/day: 0.25    Years: 25.00    Pack years: 6.25    Types: Cigarettes   Smokeless tobacco: Never   Tobacco comments:    pipe tobacco   Vaping Use   Vaping Use: Never used  Substance Use Topics   Alcohol use: Yes    Comment: Socially - once a month   Drug use: No    Family History Family History  Problem Relation Age of Onset    Diabetes Mother    Diabetes Father    Heart disease Brother    Heart attack Brother    Diabetes Brother    Kidney failure Brother    Thyroid disease Neg Hx   No family history of bleeding/clotting disorders, porphyria or autoimmune disease   No Known Allergies   REVIEW OF SYSTEMS (Negative unless checked)  Constitutional: [] Weight loss  [] Fever  [] Chills Cardiac: [] Chest pain   [] Chest pressure   [] Palpitations   [] Shortness of breath when laying flat   [] Shortness of breath with exertion. Vascular:  [] Pain in legs with walking   [] Pain in legs at rest  [] History of DVT   [] Phlebitis   [] Swelling in legs   [] Varicose veins   [] Non-healing ulcers Pulmonary:   [] Uses home oxygen   [] Productive cough   [] Hemoptysis   [] Wheeze  [] COPD   [] Asthma Neurologic:  [] Dizziness   [] Seizures   [] History of stroke   [] History of TIA  [] Aphasia   [] Vissual changes   [] Weakness or numbness in arm   [] Weakness or numbness in leg Musculoskeletal:   [] Joint swelling   [] Joint pain   [] Low back pain Hematologic:  [] Easy bruising  [] Easy bleeding   [] Hypercoagulable state   [] Anemic Gastrointestinal:  [] Diarrhea   [] Vomiting  [] Gastroesophageal reflux/heartburn   [] Difficulty swallowing. Genitourinary:  [] Chronic kidney disease   [] Difficult urination  [] Frequent urination   [] Blood in urine Skin:  [] Rashes   [] Ulcers  Psychological:  [] History of anxiety   []  History of major depression.  Physical Examination  Vitals:   11/17/20 1517  BP: 120/88  Pulse: 78  Weight: 288 lb (130.6 kg)  Height: 6\' 1"  (1.854 m)   Body mass index is 38 kg/m. Gen: WD/WN, NAD Head: [place]/AT, No temporalis wasting.  Ear/Nose/Throat: Hearing grossly intact, nares w/o erythema or drainage, poor dentition Eyes: PER, EOMI, sclera nonicteric.  Neck: Supple, no masses.  No bruit or JVD.  Pulmonary:  Good air movement, clear to auscultation bilaterally, no use of accessory muscles.  Cardiac: RRR, normal S1, S2, no  Murmurs. Vascular:  Vessel Right Left  Radial Palpable Palpable  Gastrointestinal: soft, non-distended. No guarding/no peritoneal signs.  Musculoskeletal: M/S 5/5 throughout.  No deformity or atrophy.  Neurologic: CN 2-12 intact. Pain and light touch intact in extremities.  Symmetrical.  Speech is fluent. Motor exam as listed above. Psychiatric: Judgment intact, Mood & affect appropriate for pt's clinical situation. Dermatologic: No rashes or ulcers noted.  No changes consistent with cellulitis.   CBC Lab Results  Component Value Date   WBC 5.6 10/30/2020   HGB 15.4 10/30/2020   HCT 46.2 10/30/2020   MCV 89 10/30/2020  PLT 197 10/30/2020    BMET    Component Value Date/Time   NA 139 10/30/2020 1135   K 4.1 10/30/2020 1135   CL 100 10/30/2020 1135   CO2 21 10/30/2020 1135   GLUCOSE 129 (H) 10/30/2020 1135   GLUCOSE 104 (H) 08/27/2019 0650   BUN 7 (L) 10/30/2020 1135   CREATININE 0.70 (L) 10/30/2020 1135   CALCIUM 9.5 10/30/2020 1135   GFRNONAA 83 03/28/2020 1524   GFRAA 96 03/28/2020 1524   CrCl cannot be calculated (Patient's most recent lab result is older than the maximum 21 days allowed.).  COAG Lab Results  Component Value Date   INR 1.2 08/25/2019   INR 1.1 08/24/2019   INR 1.07 07/28/2016    Radiology No results found.   Assessment/Plan 1. Abdominal aortic aneurysm (AAA) without rupture (HCC) Recommend: The patient has an abdominal aortic aneurysm that is 5.0 cm by duplex scan and based on this study it appears that it is suitable for endovascular treatment.  The patient is otherwise in reasonable health.   Therefore, the patient should undergo endovascular repair of the AAA to prevent future leathal rupture.   Patient will require CT angiography of the abdomen and pelvis in order to appropriately plan repair of the AAA.  The risks and benefits as well as the alternative therapies was discussed in detail with the patient. All questions were answered.  The patient agrees to move forward the AAA repair and therefore with CT scan.  The patient will follow up with me in the office after the CT scan to review the study.   - CT Angio Abd/Pel w/ and/or w/o; Future  2. AF (paroxysmal atrial fibrillation) (HCC) Continue antiarrhythmia medications as already ordered, these medications have been reviewed and there are no changes at this time.  Continue anticoagulation as ordered by Cardiology Service   3. Essential hypertension Continue antihypertensive medications as already ordered, these medications have been reviewed and there are no changes at this time.   4. Chronic bronchitis, unspecified chronic bronchitis type (HCC) Continue pulmonary medications and aerosols as already ordered, these medications have been reviewed and there are no changes at this time.    5. Abdominal aortic aneurysm, without rupture (HCC) Recommend: The patient has an abdominal aortic aneurysm that is 5.0 cm by duplex scan and based on this study it appears that it is suitable for endovascular treatment.  The patient is otherwise in reasonable health.   Therefore, the patient should undergo endovascular repair of the AAA to prevent future leathal rupture.   Patient will require CT angiography of the abdomen and pelvis in order to appropriately plan repair of the AAA.  The risks and benefits as well as the alternative therapies was discussed in detail with the patient. All questions were answered. The patient agrees to move forward the AAA repair and therefore with CT scan.  The patient will follow up with me in the office after the CT scan to review the study.   - CT Angio Abd/Pel w/ and/or w/o; Future    Levora Dredge, MD  11/20/2020 12:33 PM

## 2020-11-20 NOTE — Telephone Encounter (Signed)
Called and spoke with Patients daughter per DPR on file. Informed her of the CT scans ordered and that she would be contacted soon by [facility]. Daughter verbalized understanding and was grateful for the call back.

## 2020-11-26 ENCOUNTER — Ambulatory Visit: Admission: RE | Admit: 2020-11-26 | Payer: Medicare HMO | Source: Ambulatory Visit

## 2020-11-27 ENCOUNTER — Ambulatory Visit: Payer: Medicare HMO | Admitting: Family Medicine

## 2020-11-28 ENCOUNTER — Encounter: Payer: Self-pay | Admitting: Family Medicine

## 2020-11-28 ENCOUNTER — Telehealth (INDEPENDENT_AMBULATORY_CARE_PROVIDER_SITE_OTHER): Payer: Medicare HMO | Admitting: Family Medicine

## 2020-11-28 ENCOUNTER — Other Ambulatory Visit: Payer: Self-pay

## 2020-11-28 DIAGNOSIS — U071 COVID-19: Secondary | ICD-10-CM | POA: Diagnosis not present

## 2020-11-28 MED ORDER — HYDROCOD POLST-CPM POLST ER 10-8 MG/5ML PO SUER: 5.0000 mL | Freq: Two times a day (BID) | ORAL | 0 refills | Status: DC | PRN

## 2020-11-28 MED ORDER — MOLNUPIRAVIR EUA 200MG CAPSULE
4.0000 | ORAL_CAPSULE | Freq: Two times a day (BID) | ORAL | 0 refills | Status: AC
  Filled 2020-11-28: qty 40, 5d supply, fill #0

## 2020-11-28 MED ORDER — BENZONATATE 200 MG PO CAPS: 200.0000 mg | ORAL_CAPSULE | Freq: Two times a day (BID) | ORAL | 0 refills | Status: DC | PRN

## 2020-11-28 MED ORDER — PREDNISONE 10 MG PO TABS: ORAL_TABLET | ORAL | 0 refills | Status: DC

## 2020-11-28 NOTE — Progress Notes (Signed)
There were no vitals taken for this visit.   Subjective:    Patient ID: Eugene Henderson, male    DOB: Aug 25, 1950, 70 y.o.   MRN: [id number]  HPI: Eugene Henderson is a 70 y.o. male  Chief Complaint  Patient presents with   Covid Positive    Patient states he has been sick for a few days, has fever, chills, cough and sore throat. Patient states he had diarrhea but it has now stopped    UPPER RESPIRATORY TRACT INFECTION Duration: 3 days Worst symptom: Fever: yes Cough: yes Shortness of breath: yes Wheezing: no Chest pain: no Chest tightness: no Chest congestion: yes Nasal congestion: yes Runny nose: yes Post nasal drip: yes Sneezing: no Sore throat: yes Swollen glands: no Sinus pressure: no Headache: yes Face pain: no Toothache: no Ear pain: no  Ear pressure: no  Eyes red/itching:no Eye drainage/crusting: yes  Vomiting: no Rash: no Fatigue: yes Sick contacts: yes Strep contacts: no  Context: better Recurrent sinusitis: no Relief with OTC cold/cough medications: no  Treatments attempted: none   Relevant past medical, surgical, family and social history reviewed and updated as indicated. Interim medical history since our last visit reviewed. Allergies and medications reviewed and updated.  Review of Systems  Constitutional:  Positive for chills, diaphoresis, fatigue and fever. Negative for activity change, appetite change and unexpected weight change.  HENT:  Positive for congestion, postnasal drip, rhinorrhea and sinus pressure. Negative for dental problem, drooling, ear discharge, ear pain, facial swelling, hearing loss, mouth sores, nosebleeds, sinus pain, sneezing, sore throat, tinnitus, trouble swallowing and voice change.   Respiratory:  Positive for cough, shortness of breath and wheezing. Negative for apnea, choking, chest tightness and stridor.   Cardiovascular: Negative.   Gastrointestinal: Negative.   Psychiatric/Behavioral: Negative.     Per HPI  unless specifically indicated above     Objective:    There were no vitals taken for this visit.  Wt Readings from Last 3 Encounters:  11/17/20 288 lb (130.6 kg)  11/17/20 288 lb (130.6 kg)  10/30/20 288 lb 3.2 oz (130.7 kg)    Physical Exam Vitals and nursing note reviewed.  Pulmonary:     Effort: Pulmonary effort is normal. No respiratory distress.     Comments: Speaking in full sentences Neurological:     Mental Status: He is alert.  Psychiatric:        Mood and Affect: Mood normal.        Behavior: Behavior normal.        Thought Content: Thought content normal.        Judgment: Judgment normal.    Results for orders placed or performed in visit on 10/30/20  Microscopic Examination   BLD  Result Value Ref Range   WBC, UA 0-5 0 - 5 /hpf   RBC None seen 0 - 2 /hpf   Epithelial Cells (non renal) None seen 0 - 10 /hpf   Mucus, UA Present (A) Not Estab.   Bacteria, UA None seen None seen/Few  Bayer DCA Hb A1c Waived  Result Value Ref Range   HB A1C (BAYER DCA - WAIVED) 7.2 (H) <7.0 %  Comprehensive metabolic panel  Result Value Ref Range   Glucose 129 (H) 65 - 99 mg/dL   BUN 7 (L) 8 - 27 mg/dL   Creatinine, Ser 0.70 (L) 0.76 - 1.27 mg/dL   eGFR 100 >59 mL/min/1.73   BUN/Creatinine Ratio 10 10 - 24   Sodium 139 134 - 144  mmol/L   Potassium 4.1 3.5 - 5.2 mmol/L   Chloride 100 96 - 106 mmol/L   CO2 21 20 - 29 mmol/L   Calcium 9.5 8.6 - 10.2 mg/dL   Total Protein 7.9 6.0 - 8.5 g/dL   Albumin 4.7 3.8 - 4.8 g/dL   Globulin, Total 3.2 1.5 - 4.5 g/dL   Albumin/Globulin Ratio 1.5 1.2 - 2.2   Bilirubin Total 0.6 0.0 - 1.2 mg/dL   Alkaline Phosphatase 119 44 - 121 IU/L   AST 13 0 - 40 IU/L   ALT 14 0 - 44 IU/L  CBC with Differential/Platelet  Result Value Ref Range   WBC 5.6 3.4 - 10.8 x10E3/uL   RBC 5.21 4.14 - 5.80 x10E6/uL   Hemoglobin 15.4 13.0 - 17.7 g/dL   Hematocrit 46.2 37.5 - 51.0 %   MCV 89 79 - 97 fL   MCH 29.6 26.6 - 33.0 pg   MCHC 33.3 31.5 - 35.7  g/dL   RDW 13.5 11.6 - 15.4 %   Platelets 197 150 - 450 x10E3/uL   Neutrophils 46 Not Estab. %   Lymphs 39 Not Estab. %   Monocytes 11 Not Estab. %   Eos 3 Not Estab. %   Basos 1 Not Estab. %   Neutrophils Absolute 2.6 1.4 - 7.0 x10E3/uL   Lymphocytes Absolute 2.2 0.7 - 3.1 x10E3/uL   Monocytes Absolute 0.6 0.1 - 0.9 x10E3/uL   EOS (ABSOLUTE) 0.2 0.0 - 0.4 x10E3/uL   Basophils Absolute 0.0 0.0 - 0.2 x10E3/uL   Immature Granulocytes 0 Not Estab. %   Immature Grans (Abs) 0.0 0.0 - 0.1 x10E3/uL  Lipid Panel w/o Chol/HDL Ratio  Result Value Ref Range   Cholesterol, Total 95 (L) 100 - 199 mg/dL   Triglycerides 82 0 - 149 mg/dL   HDL 58 >39 mg/dL   VLDL Cholesterol Cal 17 5 - 40 mg/dL   LDL Chol Calc (NIH) 20 0 - 99 mg/dL  PSA  Result Value Ref Range   Prostate Specific Ag, Serum 1.4 0.0 - 4.0 ng/mL  TSH  Result Value Ref Range   TSH 1.830 0.450 - 4.500 uIU/mL  Urinalysis, Routine w reflex microscopic  Result Value Ref Range   Specific Gravity, UA 1.020 1.005 - 1.030   pH, UA 5.5 5.0 - 7.5   Color, UA Yellow Yellow   Appearance Ur Clear Clear   Leukocytes,UA Trace (A) Negative   Protein,UA Trace (A) Negative/Trace   Glucose, UA Negative Negative   Ketones, UA Negative Negative   RBC, UA Negative Negative   Bilirubin, UA Negative Negative   Urobilinogen, Ur 0.2 0.2 - 1.0 mg/dL   Nitrite, UA Negative Negative   Microscopic Examination See below:   Microalbumin, Urine Waived  Result Value Ref Range   Microalb, Ur Waived 80 (H) 0 - 19 mg/L   Creatinine, Urine Waived 300 10 - 300 mg/dL   Microalb/Creat Ratio <30 <30 mg/g      Assessment & Plan:   Problem List Items Addressed This Visit   None Visit Diagnoses     COVID-19    -  Primary   Will treat with mulnopiovir, prednisone, tussionex and tessalon. Monitor closely. Call with any concerns.    Relevant Medications   molnupiravir EUA 200 mg CAPS        Follow up plan: Return if symptoms worsen or fail to  improve.    This visit was completed via telephone due to the restrictions of the COVID-19  pandemic. All issues as above were discussed and addressed but no physical exam was performed. If it was felt that the patient should be evaluated in the office, they were directed there. The patient verbally consented to this visit. Patient was unable to complete an audio/visual visit due to Lack of equipment. Due to the catastrophic nature of the COVID-19 pandemic, this visit was done through audio contact only. Location of the patient: home Location of the provider: work Those involved with this call:  Provider: Park Liter, DO CMA: Louanna Raw, [place] Desk/Registration: Jill Side  Time spent on call:  21 minutes on the phone discussing health concerns. 30 minutes total spent in review of patient's record and preparation of their chart.

## 2020-12-05 ENCOUNTER — Ambulatory Visit: Payer: Medicare HMO | Admitting: Cardiology

## 2020-12-18 ENCOUNTER — Ambulatory Visit: Payer: Medicare HMO | Admitting: Nurse Practitioner

## 2020-12-18 NOTE — Progress Notes (Deleted)
There were no vitals taken for this visit.   Subjective:    Patient ID: Eugene Henderson, male    DOB: 1951-01-21, 70 y.o.   MRN: [id number]  HPI: Eugene Henderson is a 70 y.o. male  No chief complaint on file.   Relevant past medical, surgical, family and social history reviewed and updated as indicated. Interim medical history since our last visit reviewed. Allergies and medications reviewed and updated.  Review of Systems  Per HPI unless specifically indicated above     Objective:    There were no vitals taken for this visit.  Wt Readings from Last 3 Encounters:  11/17/20 288 lb (130.6 kg)  11/17/20 288 lb (130.6 kg)  10/30/20 288 lb 3.2 oz (130.7 kg)    Physical Exam  Results for orders placed or performed in visit on 10/30/20  Microscopic Examination   BLD  Result Value Ref Range   WBC, UA 0-5 0 - 5 /hpf   RBC None seen 0 - 2 /hpf   Epithelial Cells (non renal) None seen 0 - 10 /hpf   Mucus, UA Present (A) Not Estab.   Bacteria, UA None seen None seen/Few  Bayer DCA Hb A1c Waived  Result Value Ref Range   HB A1C (BAYER DCA - WAIVED) 7.2 (H) <7.0 %  Comprehensive metabolic panel  Result Value Ref Range   Glucose 129 (H) 65 - 99 mg/dL   BUN 7 (L) 8 - 27 mg/dL   Creatinine, Ser 0.70 (L) 0.76 - 1.27 mg/dL   eGFR 100 >59 mL/min/1.73   BUN/Creatinine Ratio 10 10 - 24   Sodium 139 134 - 144 mmol/L   Potassium 4.1 3.5 - 5.2 mmol/L   Chloride 100 96 - 106 mmol/L   CO2 21 20 - 29 mmol/L   Calcium 9.5 8.6 - 10.2 mg/dL   Total Protein 7.9 6.0 - 8.5 g/dL   Albumin 4.7 3.8 - 4.8 g/dL   Globulin, Total 3.2 1.5 - 4.5 g/dL   Albumin/Globulin Ratio 1.5 1.2 - 2.2   Bilirubin Total 0.6 0.0 - 1.2 mg/dL   Alkaline Phosphatase 119 44 - 121 IU/L   AST 13 0 - 40 IU/L   ALT 14 0 - 44 IU/L  CBC with Differential/Platelet  Result Value Ref Range   WBC 5.6 3.4 - 10.8 x10E3/uL   RBC 5.21 4.14 - 5.80 x10E6/uL   Hemoglobin 15.4 13.0 - 17.7 g/dL   Hematocrit 46.2 37.5 - 51.0 %    MCV 89 79 - 97 fL   MCH 29.6 26.6 - 33.0 pg   MCHC 33.3 31.5 - 35.7 g/dL   RDW 13.5 11.6 - 15.4 %   Platelets 197 150 - 450 x10E3/uL   Neutrophils 46 Not Estab. %   Lymphs 39 Not Estab. %   Monocytes 11 Not Estab. %   Eos 3 Not Estab. %   Basos 1 Not Estab. %   Neutrophils Absolute 2.6 1.4 - 7.0 x10E3/uL   Lymphocytes Absolute 2.2 0.7 - 3.1 x10E3/uL   Monocytes Absolute 0.6 0.1 - 0.9 x10E3/uL   EOS (ABSOLUTE) 0.2 0.0 - 0.4 x10E3/uL   Basophils Absolute 0.0 0.0 - 0.2 x10E3/uL   Immature Granulocytes 0 Not Estab. %   Immature Grans (Abs) 0.0 0.0 - 0.1 x10E3/uL  Lipid Panel w/o Chol/HDL Ratio  Result Value Ref Range   Cholesterol, Total 95 (L) 100 - 199 mg/dL   Triglycerides 82 0 - 149 mg/dL   HDL 58 >39 mg/dL  VLDL Cholesterol Cal 17 5 - 40 mg/dL   LDL Chol Calc (NIH) 20 0 - 99 mg/dL  PSA  Result Value Ref Range   Prostate Specific Ag, Serum 1.4 0.0 - 4.0 ng/mL  TSH  Result Value Ref Range   TSH 1.830 0.450 - 4.500 uIU/mL  Urinalysis, Routine w reflex microscopic  Result Value Ref Range   Specific Gravity, UA 1.020 1.005 - 1.030   pH, UA 5.5 5.0 - 7.5   Color, UA Yellow Yellow   Appearance Ur Clear Clear   Leukocytes,UA Trace (A) Negative   Protein,UA Trace (A) Negative/Trace   Glucose, UA Negative Negative   Ketones, UA Negative Negative   RBC, UA Negative Negative   Bilirubin, UA Negative Negative   Urobilinogen, Ur 0.2 0.2 - 1.0 mg/dL   Nitrite, UA Negative Negative   Microscopic Examination See below:   Microalbumin, Urine Waived  Result Value Ref Range   Microalb, Ur Waived 80 (H) 0 - 19 mg/L   Creatinine, Urine Waived 300 10 - 300 mg/dL   Microalb/Creat Ratio <30 <30 mg/g      Assessment & Plan:   Problem List Items Addressed This Visit   None    Follow up plan: No follow-ups on file.

## 2020-12-25 ENCOUNTER — Encounter: Payer: Medicare HMO | Admitting: Cardiothoracic Surgery

## 2020-12-30 ENCOUNTER — Other Ambulatory Visit: Payer: Medicare HMO

## 2020-12-31 ENCOUNTER — Other Ambulatory Visit: Payer: Medicare HMO

## 2021-01-05 ENCOUNTER — Ambulatory Visit: Payer: Medicare HMO | Admitting: Physician Assistant

## 2021-01-05 NOTE — Progress Notes (Deleted)
Cardiology Office Note    Date:  01/05/2021   ID:  Eugene Henderson, DOB [birth year], MRN [id number]  PCP:  Dorcas Carrow, DO  Cardiologist:  Lorine Bears, MD  Electrophysiologist:  None   Chief Complaint: Follow up  History of Present Illness:   Eugene Henderson is a 70 y.o. male with history of HFrEF secondary to NICM, permanent A. fib, ascending aortic aneurysm, hypothyroidism previously not on medication, GI bleed, HTN, and medical and dietary nonadherence who is intermittently lost to follow-up who presents for follow-up of ***  He was admitted in 07/2016 with acute HFrEF in the setting of A. fib with RVR.  Echo showed an EF of 25 to 30%, moderate mitral regurgitation, and moderate pulmonary hypertension.  Cardiac cath in 07/2016 showed no significant CAD.  He was started on medical therapy for heart failure and anticoagulation for A. fib.  Repeat echo in 01/2017 showed an EF of 30 to 35%, moderate LVH, diffuse hypokinesis, moderately dilated aortic root measuring 47 mm with an ascending aorta measuring 50 mm, moderate mitral regurgitation, severely dilated left atrium, mildly dilated right ventricle with mildly to moderately reduced RV systolic function, severely dilated right atrium, and a PASP of 40 to 45 mmHg.  Most recent echo in 07/2017 showed a persistent cardiomyopathy with an EF of 30 to 35%, mild concentric LVH, diffuse hypokinesis, mild mitral regurgitation, mildly dilated left atrium, normal RV systolic function, mildly dilated right atrium, and an estimated PASP of 40 mmHg.  Rhythm of A. fib.  CT of the chest in 09/2017 showed an ascending aortic aneurysm measuring 51 mm.  He was admitted in 08/2019 with GI bleed requiring PRBC.  Colonoscopy showed diverticular disease.  Eliquis was stopped at that admission.  Following discharge, he did not follow-up with GI or cardiology.  He was lost to follow-up from 2019 until he was recently seen in 11/2020, at which time he continued to smoke  and felt like he was taking too many medications.  He remained in A. fib.  When compared to his visit in 2019, his weight was up 23 pounds.  Toprol was titrated to 100 mg.  Otherwise he was continued on irbesartan, spironolactone, and Lasix.  Echo was recommended, though has been canceled twice.  With regards to his a sending aortic aneurysm, he was referred to CT surgery, though appointment was canceled.  With regards to his A. fib with history of GI bleed, it was recommended he follow-up with GI for further input on risk of reinitiating Eliquis.  ***   Labs independently reviewed: 10/2020 - TSH normal, TC 95, TG 82, HDL 58, LDL 20, Hgb 15.4, PLT 197, BUN 7, serum creatinine 0.7, potassium 4.1, albumin 4.7, AST/ALT normal, A1c 7.2  Past Medical History:  Diagnosis Date   Arthritis    Ascending aortic aneurysm (HCC)    a. 09/2017 Stable TAA - 5.1cm.   Asthma    Chronic systolic CHF (congestive heart failure) (HCC)    a. EF 25-30% by echo in 07/2016 with cath showing no significant CAD b. 01/2017: EF 30-35% with diffuse HK and moderate MR; c. 07/2017 Echo: EF 30-35%, diff hK. Mild MR, mildly dil LA. PASP .   Diverticulitis    Diverticulitis of large intestine with perforation without abscess or bleeding 05/13/2017   Hypertension    Hyperthyroidism    NICM (nonischemic cardiomyopathy) (HCC)    Noncompliance    Persistent atrial fibrillation (HCC)    a. CHA2DS2VASc = 3-->Eliquis (?  compliance).    Past Surgical History:  Procedure Laterality Date   COLONOSCOPY N/A 08/27/2019   Procedure: COLONOSCOPY;  Surgeon: Toledo, Boykin Nearing, MD;  Location: [facility] ENDOSCOPY;  Service: Gastroenterology;  Laterality: N/A;   JOINT REPLACEMENT Right    RIGHT/LEFT HEART CATH AND CORONARY ANGIOGRAPHY N/A 08/02/2016   Procedure: Right/Left Heart Cath and Coronary Angiography;  Surgeon: Iran Ouch, MD;  Location: [facility] INVASIVE CV LAB;  Service: Cardiovascular;  Laterality: N/A;   TESTICLE SURGERY      Patient states that he had to have the tube fixed.    Current Medications: No outpatient medications have been marked as taking for the 01/05/21 encounter (Appointment) with Sondra Barges, PA-C.    Allergies:   Patient has no known allergies.   Social History   Socioeconomic History   Marital status: Single    Spouse name: Not on file   Number of children: Not on file   Years of education: Not on file   Highest education level: Not on file  Occupational History   Occupation: retired  Tobacco Use   Smoking status: Some Days    Packs/day: 0.25    Years: 25.00    Pack years: 6.25    Types: Cigarettes   Smokeless tobacco: Never   Tobacco comments:    pipe tobacco   Vaping Use   Vaping Use: Never used  Substance and Sexual Activity   Alcohol use: Yes    Comment: Socially - once a month   Drug use: No   Sexual activity: Not Currently  Other Topics Concern   Not on file  Social History Narrative   Not on file   Social Determinants of Health   Financial Resource Strain: Low Risk    Difficulty of Paying Living Expenses: Not hard at all  Food Insecurity: No Food Insecurity   Worried About Programme researcher, broadcasting/film/video in the Last Year: Never true   Ran Out of Food in the Last Year: Never true  Transportation Needs: No Transportation Needs   Lack of Transportation (Medical): No   Lack of Transportation (Non-Medical): No  Physical Activity: Insufficiently Active   Days of Exercise per Week: 2 days   Minutes of Exercise per Session: 20 min  Stress: No Stress Concern Present   Feeling of Stress : Not at all  Social Connections: Not on file     Family History:  The patient's family history includes Diabetes in his brother, father, and mother; Heart attack in his brother; Heart disease in his brother; Kidney failure in his brother. There is no history of Thyroid disease.  ROS:   ROS   EKGs/Labs/Other Studies Reviewed:    Studies reviewed were summarized above. The additional  studies were reviewed today:  2D echo 07/29/2016: - Left ventricle: The cavity size was mildly dilated. There was    moderate concentric hypertrophy. Systolic function was severely    reduced. The estimated ejection fraction was in the range of 25%    to 30%. Diffuse hypokinesis.  - Aorta: Aortic root dimension: 50 mm (ED).  - Aortic root: The aortic root was moderately dilated.  - Mitral valve: There was moderate regurgitation.  - Left atrium: The atrium was severely dilated.  - Right atrium: The atrium was severely dilated.  - Pulmonary arteries: Systolic pressure was moderately increased.    PA peak pressure: 50 mm Hg (S). __________  [facility] 08/02/2016: 1. No significant coronary artery disease. 2. Severely reduced LV systolic function by  echo. Left ventricular angiography was not performed. 3. Right heart catheterization showed minimally elevated filling pressures and pulmonary hypertension. Moderately reduced cardiac output. RA pressure: 12 mmHg, RV pressure: 40 over 6 mmHg, PA pressure 37/12 with a mean of 12 mmHg. Pulmonary capillary wedge pressure was 12. Cardiac output was 4.16 with a cardiac index of 1.77.   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 His volume status appears to be close to normal. Thus, I switched furosemide to 40 mg by mouth twice daily. Ventricular rate is still not optimally controlled and thus I increased Toprol to 200 mg once daily. Start long-term anticoagulation with Eliquis 5 mg twice daily starting tomorrow. The patient was noted to have apnea episodes during cardiac catheterization. Recommend outpatient evaluation for sleep apnea. Avoid catheterization via the right radial artery in the future due to tortuous innominate artery and significantly dilated aortic root. __________  2D echo 02/02/2017: - Left ventricle: The cavity size was normal. Wall thickness was    increased in a pattern of moderate LVH.  Systolic function was    moderately to severely reduced. The estimated ejection fraction    was in the range of 30% to 35%. Diffuse hypokinesis. Regional    wall motion abnormalities cannot be excluded.  - Aorta: Aortic root dimension: 47 mm (ED). Ascending aortic    diameter: 50 mm (S).  - Aortic root: The aortic root was moderately dilated.  - Ascending aorta: The ascending aorta was moderately dilated.  - Mitral valve: There was moderate regurgitation.  - Left atrium: The atrium was severely dilated.  - Right ventricle: The cavity size was mildly dilated. Systolic    function was mildly to moderately reduced.  - Right atrium: The atrium was severely dilated.  - Pulmonary arteries: Systolic pressure was mildly to moderately    increased, in the range of 40 mm Hg to 45 mm Hg. __________  2D echo 07/14/2017: - Left ventricle: The cavity size was normal. There was mild    concentric hypertrophy. Systolic function was moderately to    severely reduced. The estimated ejection fraction was in the    range of 30% to 35%. Diffuse hypokinesis. The study is not    technically sufficient to allow evaluation of LV diastolic    function.  - Mitral valve: There was mild regurgitation.  - Left atrium: The atrium was mildly dilated.  - Right ventricle: Systolic function was normal.  - Right atrium: The atrium was mildly dilated.  - Pulmonary arteries: Systolic pressure was mildly elevated. PA    peak pressure: 40 mm Hg (S).   Impressions:   - Rhythm is atrial fibrillation.   EKG:  EKG is ordered today.  The EKG ordered today demonstrates ***  Recent Labs: 10/30/2020: ALT 14; BUN 7; Creatinine, Ser 0.70; Hemoglobin 15.4; Platelets 197; Potassium 4.1; Sodium 139; TSH 1.830  Recent Lipid Panel    Component Value Date/Time   CHOL 95 (L) 10/30/2020 1135   TRIG 82 10/30/2020 1135   HDL 58 10/30/2020 1135   LDLCALC 20 10/30/2020 1135    PHYSICAL EXAM:    VS:  There were no vitals taken for  this visit.  BMI: There is no height or weight on file to calculate BMI.  Physical Exam  Wt Readings from Last 3 Encounters:  11/17/20 288 lb (130.6 kg)  11/17/20 288 lb (130.6 kg)  10/30/20 288 lb 3.2 oz (130.7 kg)     ASSESSMENT & PLAN:  HFrEF secondary to NICM:  Permanent A. fib: ***.  CHA2DS2-VASc at least 4 (CHF, HTN, age x73, DM).  Ascending aortic aneurysm:  HTN: Blood pressure ***  History of GI bleed:  Tobacco use:  Disposition: F/u with Dr. Marland Kitchen or an APP in ***.   Medication Adjustments/Labs and Tests Ordered: Current medicines are reviewed at length with the patient today.  Concerns regarding medicines are outlined above. Medication changes, Labs and Tests ordered today are summarized above and listed in the Patient Instructions accessible in Encounters.   Signed, Eula Listen, PA-C 01/05/2021 7:47 AM     [facility] [place] [address] [place], [place] [address] [phone]

## 2021-01-06 ENCOUNTER — Encounter: Payer: Self-pay | Admitting: Physician Assistant

## 2021-01-16 ENCOUNTER — Telehealth: Payer: Self-pay | Admitting: *Deleted

## 2021-01-16 NOTE — Telephone Encounter (Signed)
pt daughter will call to schedule the CTA chest, then r/s appt at [facility] 8/12

## 2021-01-22 ENCOUNTER — Telehealth: Payer: Self-pay | Admitting: Cardiology

## 2021-01-22 NOTE — Telephone Encounter (Signed)
Mailbox full

## 2021-01-22 NOTE — Telephone Encounter (Signed)
-----   Message from [name], RN sent at 01/22/2021  2:43 PM EDT ----- Regarding: appt Can we get this patient scheduled for a follow up after his CT on 8/29 with either [facility] or [name] or [facility], looks like he has cancelled appointments etc...  Thanks,  [employer] RN

## 2021-01-25 ENCOUNTER — Other Ambulatory Visit: Payer: Self-pay | Admitting: Family Medicine

## 2021-01-25 NOTE — Telephone Encounter (Signed)
Last RF 10/30/20 1 with 3 RF

## 2021-02-01 ENCOUNTER — Other Ambulatory Visit: Payer: Self-pay | Admitting: Family Medicine

## 2021-02-01 NOTE — Telephone Encounter (Signed)
Req. Too soon  Last RF 10/30/20 1 each 3 RF

## 2021-02-02 ENCOUNTER — Other Ambulatory Visit: Payer: Medicare HMO

## 2021-02-02 ENCOUNTER — Inpatient Hospital Stay: Admission: RE | Admit: 2021-02-02 | Payer: Medicare HMO | Source: Ambulatory Visit

## 2021-02-02 NOTE — Telephone Encounter (Signed)
[facility] calling States that CT chest and abdomen appointments have been cancelled due to no authorization

## 2021-02-03 ENCOUNTER — Institutional Professional Consult (permissible substitution): Payer: Medicare HMO | Admitting: Thoracic Surgery (Cardiothoracic Vascular Surgery)

## 2021-02-03 NOTE — Telephone Encounter (Signed)
The following staff messages have been exchanged in response to the CT imaging ordered:  Florina Ou  You 4 hours ago (8:40 AM)   AH Good morning. Auth is on file for the CT Chest and the abd/pelvis is pending for review.    You  Hepler, Amanda 21 hours ago (3:34 PM)   Okay thank you, I will make note of this. We do not order there often. So is it being pre-certed now? Or what is our next step?    Hepler, Amanda  You; [facility] Pre Cert/Auth; Bertram Gala 22 hours ago (2:36 PM)   AH Good afternoon. When these tests are scheduled at [facility], we rely on a message from whomever in [facility] that has scheduled the tests w/ GI. Otherwise, if we do not receive a message, we have no idea to know that the patient is scheduled or that it will require an authorization. After researching this patient, we had no messages that the CT was even ordered. I have requested that [facility] let us know of anything that's ordered by [facility] that doesn't have an auth in place. I was told they have no way of doing this until the day before. Even still, we had no knowledge of this patient being scheduled.     Patient is scheduled with CT Surgery today. I forwarded the staff messages to [place] as an [place].

## 2021-02-25 ENCOUNTER — Ambulatory Visit: Payer: Medicare HMO | Admitting: Podiatry

## 2021-02-26 ENCOUNTER — Inpatient Hospital Stay: Admission: RE | Admit: 2021-02-26 | Payer: Medicare HMO | Source: Ambulatory Visit

## 2021-02-26 ENCOUNTER — Other Ambulatory Visit: Payer: Medicare HMO

## 2021-03-03 ENCOUNTER — Encounter: Payer: Medicare HMO | Admitting: Thoracic Surgery (Cardiothoracic Vascular Surgery)

## 2021-03-13 ENCOUNTER — Ambulatory Visit (INDEPENDENT_AMBULATORY_CARE_PROVIDER_SITE_OTHER): Payer: Medicare HMO

## 2021-03-13 ENCOUNTER — Other Ambulatory Visit: Payer: Self-pay

## 2021-03-13 DIAGNOSIS — I5020 Unspecified systolic (congestive) heart failure: Secondary | ICD-10-CM

## 2021-03-13 DIAGNOSIS — I77810 Thoracic aortic ectasia: Secondary | ICD-10-CM | POA: Diagnosis not present

## 2021-03-13 LAB — ECHOCARDIOGRAM COMPLETE
AR max vel: 6.46 cm2
AV Area VTI: 6.05 cm2
AV Area mean vel: 6.72 cm2
AV Mean grad: 1 mmHg
AV Peak grad: 1.6 mmHg
Ao pk vel: 0.64 m/s
S' Lateral: 4.8 cm

## 2021-03-13 MED ORDER — PERFLUTREN LIPID MICROSPHERE
1.0000 mL | INTRAVENOUS | Status: AC | PRN
  Administered 2021-03-13: 2 mL via INTRAVENOUS

## 2021-03-13 NOTE — Progress Notes (Deleted)
Cardiology Office Note    Date:  03/13/2021   ID:  Eugene Henderson, DOB 1951/04/26, MRN [id number]  PCP:  Dorcas Carrow, DO  Cardiologist:  Lorine Bears, MD  Electrophysiologist:  None   Chief Complaint: Follow-up  History of Present Illness:   Eugene Henderson is a 70 y.o. male with history of HFrEF secondary to NICM, persistent A. fib, HTN, ascending aortic aneurysm, hyperthyroidism previously not on medications, GI bleed in 08/2019, ongoing tobacco use, and medical noncompliance who presents for follow-up of his cardiomyopathy and a sending aortic aneurysm.  He was admitted to the hospital in 07/2016 with acute HFrEF in the setting of A. fib with RVR.  This occurred in the context of hyperthyroidism while not on medical therapy.  Echo showed an EF of 25 to 30%, moderate mitral regurgitation, severe biatrial enlargement, and moderate pulmonary hypertension with a PASP estimated at 50 mmHg.  The aortic root was moderately dilated measuring 50 mm.  He underwent cardiac cath which showed no significant CAD with recommendation to optimize medical therapy.  Follow-up echo in 01/2017 demonstrated a persistent cardiomyopathy with an EF of 30 to 35%, diffuse hypokinesis, moderately dilated aortic root/ascending aorta measuring 47 mm and an ascending aorta measuring 50 mm, respectively, moderate mitral regurgitation, severe biatrial enlargement, mildly dilated RV cavity size with moderately reduced RV systolic function, PASP 40 to 45 mmHg.  Echo from 07/2017 showed an EF of 30 to 35%, diffuse hypokinesis, mild mitral regurgitation, mildly dilated left atrium, normal RV systolic function, mildly dilated right atrium, and a PASP of 40 mmHg.  The aorta was poorly visualized with an aortic root moderately dilated at 49 mm.  Rhythm was A. fib.  He has been intermittently lost to follow-up.  Prior notes have indicated he is not cautious with his salt intake and is relatively sedentary.  He was previously on  Eliquis given persistent A. fib, though this was discontinued during admission in 08/2019 for GI bleed requiring blood transfusion.  Colonoscopy showed diverticular disease.  He did not follow-up with cardiology or GI after discharge.  He was last seen in the office in 11/2020 to reestablish care.  He reported adherence to medications, though stated he was on too many medicines.  He continued to smoke.  Echo was recommended to reevaluate his cardiomyopathy.  He was referred to CT for further input regarding his ascending aortic dilatation.  Since his last visit with [place], he has canceled multiple CTA's to further evaluate his aortic dilatation as well as several appointments with cardiothoracic surgery, echocardiograms, and cardiology follow-ups.  ***   Labs independently reviewed: 10/2020 - TSH normal, TC 95, TG 82, HDL 58, LDL 20, Hgb 15.4, PLT 197, BUN 7, serum creatinine 0.7, potassium 4.1, albumin 4.7, AST/ALT normal, A1c 7.2  Past Medical History:  Diagnosis Date   Arthritis    Ascending aortic aneurysm (HCC)    a. 09/2017 Stable TAA - 5.1cm.   Asthma    Chronic systolic CHF (congestive heart failure) (HCC)    a. EF 25-30% by echo in 07/2016 with cath showing no significant CAD b. 01/2017: EF 30-35% with diffuse HK and moderate MR; c. 07/2017 Echo: EF 30-35%, diff hK. Mild MR, mildly dil LA. PASP .   Diverticulitis    Diverticulitis of large intestine with perforation without abscess or bleeding 05/13/2017   Hypertension    Hyperthyroidism    NICM (nonischemic cardiomyopathy) (HCC)    Noncompliance    Persistent atrial fibrillation (HCC)  a. CHA2DS2VASc = 3-->Eliquis (? compliance).    Past Surgical History:  Procedure Laterality Date   COLONOSCOPY N/A 08/27/2019   Procedure: COLONOSCOPY;  Surgeon: Toledo, Boykin Nearing, MD;  Location: [facility] ENDOSCOPY;  Service: Gastroenterology;  Laterality: N/A;   JOINT REPLACEMENT Right    RIGHT/LEFT HEART CATH AND CORONARY ANGIOGRAPHY N/A 08/02/2016    Procedure: Right/Left Heart Cath and Coronary Angiography;  Surgeon: Iran Ouch, MD;  Location: [facility] INVASIVE CV LAB;  Service: Cardiovascular;  Laterality: N/A;   TESTICLE SURGERY     Patient states that he had to have the tube fixed.    Current Medications: No outpatient medications have been marked as taking for the 03/16/21 encounter (Appointment) with Sondra Barges, PA-C.    Allergies:   Patient has no known allergies.   Social History   Socioeconomic History   Marital status: Single    Spouse name: Not on file   Number of children: Not on file   Years of education: Not on file   Highest education level: Not on file  Occupational History   Occupation: retired  Tobacco Use   Smoking status: Some Days    Packs/day: 0.25    Years: 25.00    Pack years: 6.25    Types: Cigarettes   Smokeless tobacco: Never   Tobacco comments:    pipe tobacco   Vaping Use   Vaping Use: Never used  Substance and Sexual Activity   Alcohol use: Yes    Comment: Socially - once a month   Drug use: No   Sexual activity: Not Currently  Other Topics Concern   Not on file  Social History Narrative   Not on file   Social Determinants of Health   Financial Resource Strain: Low Risk    Difficulty of Paying Living Expenses: Not hard at all  Food Insecurity: No Food Insecurity   Worried About Programme researcher, broadcasting/film/video in the Last Year: Never true   Ran Out of Food in the Last Year: Never true  Transportation Needs: No Transportation Needs   Lack of Transportation (Medical): No   Lack of Transportation (Non-Medical): No  Physical Activity: Insufficiently Active   Days of Exercise per Week: 2 days   Minutes of Exercise per Session: 20 min  Stress: No Stress Concern Present   Feeling of Stress : Not at all  Social Connections: Not on file     Family History:  The patient's family history includes Diabetes in his brother, father, and mother; Heart attack in his brother; Heart disease in his  brother; Kidney failure in his brother. There is no history of Thyroid disease.  ROS:   ROS   EKGs/Labs/Other Studies Reviewed:    Studies reviewed were summarized above. The additional studies were reviewed today:  2D echo 03/13/2021: *** __________  2D echo 07/14/2017: - Left ventricle: The cavity size was normal. There was mild    concentric hypertrophy. Systolic function was moderately to    severely reduced. The estimated ejection fraction was in the    range of 30% to 35%. Diffuse hypokinesis. The study is not    technically sufficient to allow evaluation of LV diastolic    function.  - Mitral valve: There was mild regurgitation.  - Left atrium: The atrium was mildly dilated.  - Right ventricle: Systolic function was normal.  - Right atrium: The atrium was mildly dilated.  - Pulmonary arteries: Systolic pressure was mildly elevated. PA    peak pressure: 40  mm Hg (S).   Impressions:   - Rhythm is atrial fibrillation.  __________  2D echo 02/02/2017: - Left ventricle: The cavity size was normal. Wall thickness was    increased in a pattern of moderate LVH. Systolic function was    moderately to severely reduced. The estimated ejection fraction    was in the range of 30% to 35%. Diffuse hypokinesis. Regional    wall motion abnormalities cannot be excluded.  - Aorta: Aortic root dimension: 47 mm (ED). Ascending aortic    diameter: 50 mm (S).  - Aortic root: The aortic root was moderately dilated.  - Ascending aorta: The ascending aorta was moderately dilated.  - Mitral valve: There was moderate regurgitation.  - Left atrium: The atrium was severely dilated.  - Right ventricle: The cavity size was mildly dilated. Systolic    function was mildly to moderately reduced.  - Right atrium: The atrium was severely dilated.  - Pulmonary arteries: Systolic pressure was mildly to moderately    increased, in the range of 40 mm Hg to 45 mm Hg.  __________  [facility] 08/02/2016: 1. No  significant coronary artery disease. 2. Severely reduced LV systolic function by echo. Left ventricular angiography was not performed.  3. Right heart catheterization showed minimally elevated filling pressures and pulmonary hypertension. Moderately reduced cardiac output. RA pressure: 12 mmHg, RV pressure: 40 over 6 mmHg, PA pressure 37/12 with a mean of 12 mmHg. Pulmonary capillary wedge pressure was 12. Cardiac output was 4.16 with a cardiac index of 1.77.   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 His volume status appears to be close to normal. Thus, I switched furosemide to 40 mg by mouth twice daily. Ventricular rate is still not optimally controlled and thus I increased Toprol to 200 mg once daily. Start long-term anticoagulation with Eliquis 5 mg twice daily starting tomorrow.  The patient was noted to have apnea episodes during cardiac catheterization. Recommend outpatient evaluation for sleep apnea. Avoid catheterization via the right radial artery in the future due to tortuous innominate artery and significantly dilated aortic root. __________  2D echo 07/29/2016: - Left ventricle: The cavity size was mildly dilated. There was    moderate concentric hypertrophy. Systolic function was severely    reduced. The estimated ejection fraction was in the range of 25%    to 30%. Diffuse hypokinesis.  - Aorta: Aortic root dimension: 50 mm (ED).  - Aortic root: The aortic root was moderately dilated.  - Mitral valve: There was moderate regurgitation.  - Left atrium: The atrium was severely dilated.  - Right atrium: The atrium was severely dilated.  - Pulmonary arteries: Systolic pressure was moderately increased.    PA peak pressure: 50 mm Hg (S).     EKG:  EKG is ordered today.  The EKG ordered today demonstrates ***  Recent Labs: 10/30/2020: ALT 14; BUN 7; Creatinine, Ser 0.70; Hemoglobin 15.4; Platelets 197; Potassium 4.1; Sodium 139;  TSH 1.830  Recent Lipid Panel    Component Value Date/Time   CHOL 95 (L) 10/30/2020 1135   TRIG 82 10/30/2020 1135   HDL 58 10/30/2020 1135   LDLCALC 20 10/30/2020 1135    PHYSICAL EXAM:    VS:  There were no vitals taken for this visit.  BMI: There is no height or weight on file to calculate BMI.  Physical Exam  Wt Readings from Last 3 Encounters:  11/17/20 288 lb (130.6 kg)  11/17/20 288 lb (130.6 kg)  10/30/20 288 lb 3.2 oz (130.7 kg)     ASSESSMENT & PLAN:   HFrEF secondary to NICM:  Persistent A. fib: ***. CHADS2VASc at least 3 (CHF, HTN, age x 1).  Ascending thoracic aortic aneurysm:  HTN: Blood pressure ***  History of GI bleed:  Tobacco use:  Disposition: F/u with Dr. Kirke Corin or an APP in ***.   Medication Adjustments/Labs and Tests Ordered: Current medicines are reviewed at length with the patient today.  Concerns regarding medicines are outlined above. Medication changes, Labs and Tests ordered today are summarized above and listed in the Patient Instructions accessible in Encounters.   Signed, Eula Listen, PA-C 03/13/2021 11:33 AM     [facility] [place] [address] [place], [place] [address] [phone]

## 2021-03-16 ENCOUNTER — Ambulatory Visit: Payer: Medicare HMO | Admitting: Physician Assistant

## 2021-03-17 ENCOUNTER — Telehealth: Payer: Self-pay

## 2021-03-17 NOTE — Telephone Encounter (Signed)
Unable to reach patient, Left a VM requesting a call back. He has no showed several CT scan appointments and has not followed up with Vascular surgery and because of the no show scans they won't see him yet. Patient also no showed for follow up appointment yesterday with Eula Listen.  Called to give patient the following result note that I also sent to his MyChart:  Severely dilated aortic root.  CT surgery referral was previously made.  Please make sure patient keeps her appointment.  Echocardiogram shows severely reduced ejection fraction, EF 30%.  Continue medications for heart failure, keep follow-up appointment.

## 2021-03-17 NOTE — Telephone Encounter (Signed)
-----   Message from Jarvis Newcomer, RN sent at 03/16/2021  2:25 PM EDT ----- Pt no show

## 2021-03-20 NOTE — Telephone Encounter (Signed)
Unable to reach patient. Will send letter. Closing encounter.

## 2021-04-14 ENCOUNTER — Other Ambulatory Visit: Payer: Self-pay | Admitting: Family Medicine

## 2021-04-14 NOTE — Telephone Encounter (Signed)
Requested Prescriptions  Pending Prescriptions Disp Refills  . folic acid (FOLVITE) 1 MG tablet [Pharmacy Med Name: FOLIC ACID 1 MG TABLET] 90 tablet 1    Sig: TAKE 1 TABLET BY MOUTH EVERY DAY     Endocrinology:  Vitamins Passed - 04/14/2021  1:33 AM      Passed - Valid encounter within last 12 months    Recent Outpatient Visits          4 months ago COVID-19   [facility], Megan P, DO   5 months ago Diabetes mellitus without complication [facility])   [facility] [place], Megan P, DO   1 year ago Diabetes mellitus without complication [facility])   [facility] [place], Megan P, DO   1 year ago Essential hypertension   [facility] [place], Megan P, DO   2 years ago Diabetes mellitus without complication [facility])   [facility] [place], Megan P, DO      Future Appointments            In 3 months [facility], [facility]

## 2021-04-20 ENCOUNTER — Other Ambulatory Visit: Payer: Self-pay | Admitting: Family Medicine

## 2021-04-20 NOTE — Telephone Encounter (Signed)
Requested medication (s) are due for refill today:   Yes  Requested medication (s) are on the active medication list:   Yes  Future visit scheduled:   No   Last ordered: 10/30/2020 #180, 1 refill  Returned because protocol failed due to lab work needed.    Requested Prescriptions  Pending Prescriptions Disp Refills   metFORMIN (GLUCOPHAGE-XR) 500 MG 24 hr tablet [Pharmacy Med Name: METFORMIN HCL ER 500 MG TABLET] 180 tablet 1    Sig: Take 1 tablet (500 mg total) by mouth in the morning and at bedtime.     Endocrinology:  Diabetes - Biguanides Failed - 04/20/2021  1:35 AM      Failed - Cr in normal range and within 360 days    Creatinine, Ser  Date Value Ref Range Status  10/30/2020 0.70 (L) 0.76 - 1.27 mg/dL Final          Failed - AA eGFR in normal range and within 360 days    GFR calc Af Amer  Date Value Ref Range Status  03/28/2020 96 >59 mL/min/1.73 Final    Comment:    **In accordance with recommendations from the NKF-ASN Task force,**   [facility] is in the process of updating its eGFR calculation to the   2021 CKD-EPI creatinine equation that estimates kidney function   without a race variable.    GFR calc non Af Amer  Date Value Ref Range Status  03/28/2020 83 >59 mL/min/1.73 Final   eGFR  Date Value Ref Range Status  10/30/2020 100 >59 mL/min/1.73 Final          Passed - HBA1C is between 0 and 7.9 and within 180 days    HB A1C (BAYER DCA - WAIVED)  Date Value Ref Range Status  10/30/2020 7.2 (H) <7.0 % Final    Comment:                                          Diabetic Adult            <7.0                                       Healthy Adult        4.3 - 5.7                                                           (DCCT/NGSP) American Diabetes Association's Summary of Glycemic Recommendations for Adults with Diabetes: Hemoglobin A1c <7.0%. More stringent glycemic goals (A1c <6.0%) may further reduce complications at the cost of increased risk of  hypoglycemia.           Passed - Valid encounter within last 6 months    Recent Outpatient Visits           4 months ago COVID-19   [place], Megan P, DO   5 months ago Diabetes mellitus without complication [facility])   [place], Megan P, DO   1 year ago Diabetes mellitus without complication [facility])   [place], Megan P, DO   1  year ago Essential hypertension   [place], [place], DO   2 years ago Diabetes mellitus without complication [facility])   [place], Megan P, DO       Future Appointments             In 2 months [place], [facility]

## 2021-04-21 NOTE — Telephone Encounter (Signed)
Needs appt

## 2021-04-22 NOTE — Telephone Encounter (Signed)
Lmom for patient to call and schedule appointment. 

## 2021-05-04 ENCOUNTER — Other Ambulatory Visit: Payer: Self-pay | Admitting: *Deleted

## 2021-05-04 ENCOUNTER — Other Ambulatory Visit: Payer: Self-pay | Admitting: Family Medicine

## 2021-05-04 MED ORDER — METOPROLOL SUCCINATE ER 100 MG PO TB24: 100.0000 mg | ORAL_TABLET | Freq: Every day | ORAL | 0 refills | Status: DC

## 2021-05-04 NOTE — Telephone Encounter (Signed)
Requested Prescriptions  Pending Prescriptions Disp Refills  . KLOR-CON M20 20 MEQ tablet [Pharmacy Med Name: KLOR-CON M20 TABLET] 90 tablet 1    Sig: TAKE 1 TABLET BY MOUTH EVERY DAY     Endocrinology:  Minerals - Potassium Supplementation Failed - 05/04/2021  3:00 PM      Failed - Cr in normal range and within 360 days    Creatinine, Ser  Date Value Ref Range Status  10/30/2020 0.70 (L) 0.76 - 1.27 mg/dL Final         Passed - K in normal range and within 360 days    Potassium  Date Value Ref Range Status  10/30/2020 4.1 3.5 - 5.2 mmol/L Final         Passed - Valid encounter within last 12 months    Recent Outpatient Visits          5 months ago COVID-19   [facility], Megan P, DO   6 months ago Diabetes mellitus without complication [facility])   [facility] [place], Megan P, DO   1 year ago Diabetes mellitus without complication [facility])   [facility] [place], Megan P, DO   1 year ago Essential hypertension   [facility] [place], Megan P, DO   2 years ago Diabetes mellitus without complication (HCC)   [facility] [place], Megan P, DO      Future Appointments            In 2 months [facility], [facility]            . albuterol (VENTOLIN HFA) 108 (90 Base) MCG/ACT inhaler [Pharmacy Med Name: ALBUTEROL HFA (PROVENTIL) INH] 6.7 each 3    Sig: TAKE 2 PUFFS BY MOUTH EVERY 6 HOURS AS NEEDED FOR WHEEZE OR SHORTNESS OF BREATH     Pulmonology:  Beta Agonists Failed - 05/04/2021  3:00 PM      Failed - One inhaler should last at least one month. If the patient is requesting refills earlier, contact the patient to check for uncontrolled symptoms.      Passed - Valid encounter within last 12 months    Recent Outpatient Visits          5 months ago COVID-19   [facility], Megan P, DO   6 months ago Diabetes mellitus without complication [facility])   [facility] [place],  Megan P, DO   1 year ago Diabetes mellitus without complication [facility])   [facility] [place], Megan P, DO   1 year ago Essential hypertension   [facility] [place], Megan P, DO   2 years ago Diabetes mellitus without complication [facility])   [facility] [place], Megan P, DO      Future Appointments            In 2 months [facility], [facility]

## 2021-06-02 ENCOUNTER — Other Ambulatory Visit: Payer: Self-pay

## 2021-06-02 MED ORDER — METOPROLOL SUCCINATE ER 100 MG PO TB24: 100.0000 mg | ORAL_TABLET | Freq: Every day | ORAL | 0 refills | Status: DC

## 2021-06-12 ENCOUNTER — Other Ambulatory Visit: Payer: Self-pay | Admitting: Family Medicine

## 2021-06-12 ENCOUNTER — Telehealth: Payer: Self-pay | Admitting: *Deleted

## 2021-06-12 NOTE — Telephone Encounter (Signed)
I called and made him an appt with Dr. Wynetta Emery for 06/16/2021 at 11:20.  Requested Prescriptions  Pending Prescriptions Disp Refills   metFORMIN (GLUCOPHAGE-XR) 500 MG 24 hr tablet [Pharmacy Med Name: METFORMIN HCL ER 500 MG TABLET] 180 tablet 0    Sig: TAKE 1 TABLET (500 MG TOTAL) BY MOUTH IN THE MORNING AND AT BEDTIME.     Endocrinology:  Diabetes - Biguanides Failed - 06/12/2021  1:40 AM      Failed - Cr in normal range and within 360 days    Creatinine, Ser  Date Value Ref Range Status  10/30/2020 0.70 (L) 0.76 - 1.27 mg/dL Final         Failed - HBA1C is between 0 and 7.9 and within 180 days    HB A1C (BAYER DCA - WAIVED)  Date Value Ref Range Status  10/30/2020 7.2 (H) <7.0 % Final    Comment:                                          Diabetic Adult            <7.0                                       Healthy Adult        4.3 - 5.7                                                           (DCCT/NGSP) American Diabetes Association's Summary of Glycemic Recommendations for Adults with Diabetes: Hemoglobin A1c <7.0%. More stringent glycemic goals (A1c <6.0%) may further reduce complications at the cost of increased risk of hypoglycemia.          Failed - AA eGFR in normal range and within 360 days    GFR calc Af Amer  Date Value Ref Range Status  03/28/2020 96 >59 mL/min/1.73 Final    Comment:    **In accordance with recommendations from the NKF-ASN Task force,**   [facility] is in the process of updating its eGFR calculation to the   2021 CKD-EPI creatinine equation that estimates kidney function   without a race variable.    GFR calc non Af Amer  Date Value Ref Range Status  03/28/2020 83 >59 mL/min/1.73 Final   eGFR  Date Value Ref Range Status  10/30/2020 100 >59 mL/min/1.73 Final         Failed - Valid encounter within last 6 months    Recent Outpatient Visits          6 months ago [place], Megan P, DO   7 months ago  Diabetes mellitus without complication [facility])   [place], Megan P, DO   1 year ago Diabetes mellitus without complication [facility])   [place], [place], DO   1 year ago Essential hypertension   [place], Megan P, DO   2 years ago Diabetes mellitus without complication [facility])   [facility] Valerie Roys, DO      Future Appointments  In 4 days Wynetta Emery, Barb Merino, DO [place], [place]   In 1 month  [employer], [facility]

## 2021-06-12 NOTE — Telephone Encounter (Signed)
I called pt and spoke with his daughter Eugene Henderson (pt was asleep) and made him an appt with Dr. Olevia Perches for his 6 month check up and med refills.

## 2021-06-14 ENCOUNTER — Other Ambulatory Visit: Payer: Self-pay | Admitting: Family Medicine

## 2021-06-14 NOTE — Telephone Encounter (Signed)
Requested Prescriptions  Pending Prescriptions Disp Refills   budesonide-formoterol (SYMBICORT) 160-4.5 MCG/ACT inhaler 30.6 each 1    Sig: TAKE 2 PUFFS BY MOUTH TWICE A DAY     Pulmonology:  Combination Products Passed - 06/14/2021  9:38 AM      Passed - Valid encounter within last 12 months    Recent Outpatient Visits          6 months ago COVID-19   [facility], Megan P, DO   7 months ago Diabetes mellitus without complication [facility])   [facility] [place], Megan P, DO   1 year ago Diabetes mellitus without complication [facility])   [facility] [place], [place], DO   1 year ago Essential hypertension   [facility] [place], [place], DO   2 years ago Diabetes mellitus without complication [facility])   [facility] Dorcas Carrow, DO      Future Appointments            In 2 days Laural Benes, Oralia Rud, DO [facility], [facility]   In 4 weeks  [employer] [facility], [facility]

## 2021-06-16 ENCOUNTER — Ambulatory Visit: Payer: Medicare HMO | Admitting: Family Medicine

## 2021-06-18 ENCOUNTER — Encounter: Payer: Self-pay | Admitting: Family Medicine

## 2021-06-18 ENCOUNTER — Ambulatory Visit (INDEPENDENT_AMBULATORY_CARE_PROVIDER_SITE_OTHER): Payer: Medicare HMO | Admitting: Family Medicine

## 2021-06-18 ENCOUNTER — Other Ambulatory Visit: Payer: Self-pay

## 2021-06-18 ENCOUNTER — Telehealth: Payer: Self-pay

## 2021-06-18 ENCOUNTER — Ambulatory Visit: Payer: Medicare HMO | Admitting: Family Medicine

## 2021-06-18 VITALS — BP 141/80 | HR 74 | Temp 97.9°F | Wt 282.0 lb

## 2021-06-18 DIAGNOSIS — Z23 Encounter for immunization: Secondary | ICD-10-CM | POA: Diagnosis not present

## 2021-06-18 DIAGNOSIS — B182 Chronic viral hepatitis C: Secondary | ICD-10-CM | POA: Diagnosis not present

## 2021-06-18 DIAGNOSIS — E119 Type 2 diabetes mellitus without complications: Secondary | ICD-10-CM

## 2021-06-18 DIAGNOSIS — J42 Unspecified chronic bronchitis: Secondary | ICD-10-CM | POA: Diagnosis not present

## 2021-06-18 DIAGNOSIS — Z0000 Encounter for general adult medical examination without abnormal findings: Secondary | ICD-10-CM

## 2021-06-18 DIAGNOSIS — R1011 Right upper quadrant pain: Secondary | ICD-10-CM | POA: Diagnosis not present

## 2021-06-18 DIAGNOSIS — I700 Atherosclerosis of aorta: Secondary | ICD-10-CM

## 2021-06-18 DIAGNOSIS — I10 Essential (primary) hypertension: Secondary | ICD-10-CM | POA: Diagnosis not present

## 2021-06-18 DIAGNOSIS — E785 Hyperlipidemia, unspecified: Secondary | ICD-10-CM

## 2021-06-18 DIAGNOSIS — R5383 Other fatigue: Secondary | ICD-10-CM | POA: Diagnosis not present

## 2021-06-18 DIAGNOSIS — E0590 Thyrotoxicosis, unspecified without thyrotoxic crisis or storm: Secondary | ICD-10-CM | POA: Diagnosis not present

## 2021-06-18 DIAGNOSIS — I5022 Chronic systolic (congestive) heart failure: Secondary | ICD-10-CM

## 2021-06-18 DIAGNOSIS — M25561 Pain in right knee: Secondary | ICD-10-CM

## 2021-06-18 DIAGNOSIS — R3911 Hesitancy of micturition: Secondary | ICD-10-CM | POA: Diagnosis not present

## 2021-06-18 DIAGNOSIS — I714 Abdominal aortic aneurysm, without rupture, unspecified: Secondary | ICD-10-CM

## 2021-06-18 DIAGNOSIS — I480 Paroxysmal atrial fibrillation: Secondary | ICD-10-CM

## 2021-06-18 DIAGNOSIS — M25562 Pain in left knee: Secondary | ICD-10-CM

## 2021-06-18 LAB — URINALYSIS, ROUTINE W REFLEX MICROSCOPIC
Bilirubin, UA: NEGATIVE
Glucose, UA: NEGATIVE
Leukocytes,UA: NEGATIVE
Nitrite, UA: NEGATIVE
RBC, UA: NEGATIVE
Specific Gravity, UA: >1.03 — ABNORMAL HIGH (ref 1.005–1.030)
Urobilinogen, Ur: 1 mg/dL (ref 0.2–1.0)
pH, UA: 5.5 (ref 5.0–7.5)

## 2021-06-18 LAB — MICROSCOPIC EXAMINATION: RBC, Urine: NONE SEEN /hpf (ref 0–2)

## 2021-06-18 LAB — MICROALBUMIN, URINE WAIVED
Creatinine, Urine Waived: 300 mg/dL (ref 10–300)
Microalb, Ur Waived: 80 mg/L — ABNORMAL HIGH (ref 0–19)

## 2021-06-18 LAB — BAYER DCA HB A1C WAIVED: HB A1C (BAYER DCA - WAIVED): 6.9 % — ABNORMAL HIGH (ref 4.8–5.6)

## 2021-06-18 NOTE — Progress Notes (Signed)
° °BP (!) 141/80    Pulse 74    Temp 97.9 °F (36.6 °C)    Wt 282 lb (127.9 kg)    SpO2 98%    BMI 37.21 kg/m²   ° °Subjective:  ° ° Patient ID: Eugene Henderson, male    DOB: 11/22/1950, 70 y.o.   MRN: [id number] ° °HPI: °Eugene Henderson is a 70 y.o. male presenting on 06/18/2021 for comprehensive medical examination. Current medical complaints include: ° °DIABETES °Hypoglycemic episodes:no °Polydipsia/polyuria: no °Visual disturbance: no °Chest pain: no °Paresthesias: no °Glucose Monitoring: yes ° Accucheck frequency: Not Checking °Taking Insulin?: no °Blood Pressure Monitoring: not checking °Retinal Examination: Not up to Date °Foot Exam: Up to Date °Diabetic Education: Completed °Pneumovax: Up to Date °Influenza:  Given today °Aspirin: yes ° °HYPERTENSION / HYPERLIPIDEMIA °Satisfied with current treatment? yes °Duration of hypertension: chronic °BP monitoring frequency: not checking °BP medication side effects: no °Past BP meds: amlodipine, metoprolo, digoxin, lasix, irbesartan, spironalactone °Duration of hyperlipidemia: chronic °Cholesterol medication side effects: no °Cholesterol supplements: none °Past cholesterol medications: atorvastatin °Medication compliance: good compliance °Aspirin: yes °Recent stressors: no °Recurrent headaches: no °Visual changes: no °Palpitations: no °Dyspnea: no °Chest pain: no °Lower extremity edema: no °Dizzy/lightheaded: no ° °HYPERTHYROIDISM °Thyroid control status:stable °Satisfied with current treatment? yes °Medication side effects: no °Medication compliance: not on anything °Fatigue: no °Cold intolerance: no °Heat intolerance: no °Weight gain: no °Weight loss: no °Constipation: no °Diarrhea/loose stools: no °Palpitations: no °Lower extremity edema: no °Anxiety/depressed mood: no ° °He currently lives with: daughter and family °Interim Problems from his last visit: no ° °Depression Screen done today and results listed below:  °Depression screen PHQ 2/9 06/18/2021 10/30/2020 07/11/2020  03/28/2020 06/21/2019  °Decreased Interest 3 0 0 0 2  °Down, Depressed, Hopeless 1 0 0 0 2  °PHQ - 2 Score 4 0 0 0 4  °Altered sleeping 2 - - 0 3  °Tired, decreased energy 3 - - 0 3  °Change in appetite 0 - - 0 3  °Feeling bad or failure about yourself  1 - - 0 0  °Trouble concentrating 1 - - 0 3  °Moving slowly or fidgety/restless 0 - - 0 3  °Suicidal thoughts 0 - - 0 0  °PHQ-9 Score 11 - - 0 19  °Difficult doing work/chores Somewhat difficult - - Not difficult at all Somewhat difficult  ° ° ° °Past Medical History:  °Past Medical History:  °Diagnosis Date  ° Arthritis   ° Ascending aortic aneurysm   ° a. 09/2017 Stable TAA - 5.1cm.  ° Asthma   ° Chronic systolic CHF (congestive heart failure) (HCC)   ° a. EF 25-30% by echo in 07/2016 with cath showing no significant CAD b. 01/2017: EF 30-35% with diffuse HK and moderate MR; c. 07/2017 Echo: EF 30-35%, diff hK. Mild MR, mildly dil LA. PASP 40mmHg.  ° Diverticulitis   ° Diverticulitis of large intestine with perforation without abscess or bleeding 05/13/2017  ° Hypertension   ° Hyperthyroidism   ° NICM (nonischemic cardiomyopathy) (HCC)   ° Noncompliance   ° Persistent atrial fibrillation (HCC)   ° a. CHA2DS2VASc = 3-->Eliquis (? compliance).  ° ° °Surgical History:  °Past Surgical History:  °Procedure Laterality Date  ° COLONOSCOPY N/A 08/27/2019  ° Procedure: COLONOSCOPY;  Surgeon: Toledo, Teodoro K, MD;  Location: [facility];  Service: Gastroenterology;  Laterality: N/A;  ° JOINT REPLACEMENT Right   ° RIGHT/LEFT HEART CATH AND CORONARY ANGIOGRAPHY N/A 08/02/2016  ° Procedure: Right/Left Heart   Heart Cath and Coronary Angiography;  Surgeon: Wellington Hampshire, MD;  Location: [place] [facility];  Service: Cardiovascular;  Laterality: N/A;   TESTICLE SURGERY     Patient states that he had to have the tube fixed.    Medications:  Current Outpatient Medications on File Prior to Visit  Medication Sig   albuterol (VENTOLIN HFA) 108 (90 Base) MCG/ACT inhaler TAKE 2 PUFFS  BY MOUTH EVERY 6 HOURS AS NEEDED FOR WHEEZE OR SHORTNESS OF BREATH   amLODipine (NORVASC) 5 MG tablet    atorvastatin (LIPITOR) 40 MG tablet Take 1 tablet (40 mg total) by mouth daily.   budesonide-formoterol (SYMBICORT) 160-4.5 MCG/ACT inhaler TAKE 2 PUFFS BY MOUTH TWICE A DAY   digoxin (LANOXIN) 4.098 MG tablet    folic acid (FOLVITE) 1 MG tablet TAKE 1 TABLET BY MOUTH EVERY DAY   furosemide (LASIX) 40 MG tablet Take 1 tablet (40 mg total) by mouth daily.   hydrocortisone (ANUSOL-HC) 25 MG suppository One suppository twice a day as needed for hemmorhoids, (okay to substitute generic)   irbesartan (AVAPRO) 150 MG tablet Take 1 tablet (150 mg total) by mouth daily.   KLOR-CON M20 20 MEQ tablet TAKE 1 TABLET BY MOUTH EVERY DAY   metFORMIN (GLUCOPHAGE-XR) 500 MG 24 hr tablet TAKE 1 TABLET (500 MG TOTAL) BY MOUTH IN THE MORNING AND AT BEDTIME.   metoprolol succinate (TOPROL-XL) 100 MG 24 hr tablet Take 1 tablet (100 mg total) by mouth daily. TAKE WITH OR IMMEDIATELY FOLLOWING A MEAL.PLEASE CALL OFFICE TO SCHEDULE FOLLOW  UP APPOINTMENT   spironolactone (ALDACTONE) 25 MG tablet Take 1 tablet (25 mg total) by mouth daily.   thiamine 100 MG tablet Take 1 tablet (100 mg total) by mouth daily.   acetaminophen (TYLENOL) 500 MG tablet Take 500-1,000 mg by mouth every 6 (six) hours as needed for mild pain, fever or headache.    No current facility-administered medications on file prior to visit.    Allergies:  No Known Allergies  Social History:  Social History   Socioeconomic History   Marital status: Single    Spouse name: Not on file   Number of children: Not on file   Years of education: Not on file   Highest education level: Not on file  Occupational History   Occupation: retired  Tobacco Use   Smoking status: Some Days    Packs/day: 0.25    Years: 25.00    Pack years: 6.25    Types: Pipe, Cigarettes   Smokeless tobacco: Never   Tobacco comments:    pipe tobacco   Vaping Use    Vaping Use: Never used  Substance and Sexual Activity   Alcohol use: Yes    Comment: Socially - once a month   Drug use: No   Sexual activity: Not Currently  Other Topics Concern   Not on file  Social History Narrative   Not on file   Social Determinants of Health   Financial Resource Strain: Low Risk    Difficulty of Paying Living Expenses: Not hard at all  Food Insecurity: No Food Insecurity   Worried About Charity fundraiser in the Last Year: Never true   [place] in the Last Year: Never true  Transportation Needs: No Transportation Needs   Lack of Transportation (Medical): No   Lack of Transportation (Non-Medical): No  Physical Activity: Insufficiently Active   Days of Exercise per Week: 2 days   Minutes of Exercise per Session:  20 min  Stress: No Stress Concern Present   Feeling of Stress : Not at all  Social Connections: Not on file  Intimate Partner Violence: Not on file   Social History   Tobacco Use  Smoking Status Some Days   Packs/day: 0.25   Years: 25.00   Pack years: 6.25   Types: Pipe, Cigarettes  Smokeless Tobacco Never  Tobacco Comments   pipe tobacco    Social History   Substance and Sexual Activity  Alcohol Use Yes   Comment: Socially - once a month    Family History:  Family History  Problem Relation Age of Onset   Diabetes Mother    Diabetes Father    Heart disease Brother    Heart attack Brother    Diabetes Brother    Kidney failure Brother    Thyroid disease Neg Hx     Past medical history, surgical history, medications, allergies, family history and social history reviewed with patient today and changes made to appropriate areas of the chart.   Review of Systems  Constitutional:  Positive for diaphoresis. Negative for chills, fever, malaise/fatigue and weight loss.  HENT: Negative.         Runny nose  Eyes:  Positive for blurred vision. Negative for double vision, photophobia, pain, discharge and redness.  Respiratory:   Positive for shortness of breath. Negative for cough, hemoptysis, sputum production and wheezing.   Cardiovascular:  Positive for chest pain (R side for the past couple of days) and palpitations. Negative for orthopnea, claudication, leg swelling and PND.  Gastrointestinal:  Positive for abdominal pain (RUQ). Negative for blood in stool, constipation, diarrhea, heartburn, melena, nausea and vomiting.  Genitourinary: Negative.   Musculoskeletal:  Positive for joint pain and myalgias. Negative for back pain, falls and neck pain.  Skin: Negative.   Neurological: Negative.   Endo/Heme/Allergies: Negative.   Psychiatric/Behavioral: Negative.    All other ROS negative except what is listed above and in the HPI.      Objective:    BP (!) 141/80    Pulse 74    Temp 97.9 F (36.6 C)    Wt 282 lb (127.9 kg)    SpO2 98%    BMI 37.21 kg/m   Wt Readings from Last 3 Encounters:  06/18/21 282 lb (127.9 kg)  11/17/20 288 lb (130.6 kg)  11/17/20 288 lb (130.6 kg)    Physical Exam Vitals and nursing note reviewed.  Constitutional:      General: He is not in acute distress.    Appearance: Normal appearance. He is obese. He is not ill-appearing, toxic-appearing or diaphoretic.  HENT:     Head: Normocephalic and atraumatic.     Right Ear: Tympanic membrane, ear canal and external ear normal. There is no impacted cerumen.     Left Ear: Tympanic membrane, ear canal and external ear normal. There is no impacted cerumen.     Nose: Nose normal. No congestion or rhinorrhea.     Mouth/Throat:     Mouth: Mucous membranes are moist.     Pharynx: Oropharynx is clear. No oropharyngeal exudate or posterior oropharyngeal erythema.  Eyes:     General: No scleral icterus.       Right eye: No discharge.        Left eye: No discharge.     Extraocular Movements: Extraocular movements intact.     Conjunctiva/sclera: Conjunctivae normal.     Pupils: Pupils are equal, round, and reactive to light.  Neck:  Vascular: No carotid bruit.  °Cardiovascular:  °   Rate and Rhythm: Normal rate and regular rhythm.  °   Pulses: Normal pulses.  °   Heart sounds: No murmur heard. °  No friction rub. No gallop.  °Pulmonary:  °   Effort: Pulmonary effort is normal. No respiratory distress.  °   Breath sounds: Normal breath sounds. No stridor. No wheezing, rhonchi or rales.  °Chest:  °   Chest wall: No tenderness.  °Abdominal:  °   General: Abdomen is flat. Bowel sounds are normal. There is no distension.  °   Palpations: Abdomen is soft. There is no mass.  °   Tenderness: There is no abdominal tenderness. There is no right CVA tenderness, left CVA tenderness, guarding or rebound.  °   Hernia: No hernia is present.  °Genitourinary: °   Comments: Genital exam deferred with shared decision making °Musculoskeletal:     °   General: No swelling, tenderness, deformity or signs of injury.  °   Cervical back: Normal range of motion and neck supple. No rigidity. No muscular tenderness.  °   Right lower leg: No edema.  °   Left lower leg: No edema.  °Lymphadenopathy:  °   Cervical: No cervical adenopathy.  °Skin: °   General: Skin is warm and dry.  °   Capillary Refill: Capillary refill takes less than 2 seconds.  °   Coloration: Skin is not jaundiced or pale.  °   Findings: No bruising, erythema, lesion or rash.  °Neurological:  °   General: No focal deficit present.  °   Mental Status: He is alert and oriented to person, place, and time.  °   Cranial Nerves: No cranial nerve deficit.  °   Sensory: No sensory deficit.  °   Motor: No weakness.  °   Coordination: Coordination normal.  °   Gait: Gait normal.  °   Deep Tendon Reflexes: Reflexes normal.  °Psychiatric:     °   Mood and Affect: Mood normal.     °   Behavior: Behavior normal.     °   Thought Content: Thought content normal.     °   Judgment: Judgment normal.  ° ° °Results for orders placed or performed in visit on 06/18/21  °Microscopic Examination  ° BLD  °Result Value Ref Range  °  WBC, UA 0-5 0 - 5 /hpf  ° RBC None seen 0 - 2 /hpf  ° Epithelial Cells (non renal) 0-10 0 - 10 /hpf  ° Bacteria, UA Few (A) None seen/Few  °Bayer DCA Hb A1c Waived  °Result Value Ref Range  ° HB A1C (BAYER DCA - WAIVED) 6.9 (H) 4.8 - 5.6 %  °Comprehensive metabolic panel  °Result Value Ref Range  ° Glucose 120 (H) 70 - 99 mg/dL  ° BUN 13 8 - 27 mg/dL  ° Creatinine, Ser 0.77 0.76 - 1.27 mg/dL  ° eGFR 96 >59 mL/min/1.73  ° BUN/Creatinine Ratio 17 10 - 24  ° Sodium 139 134 - 144 mmol/L  ° Potassium 4.2 3.5 - 5.2 mmol/L  ° Chloride 101 96 - 106 mmol/L  ° CO2 22 20 - 29 mmol/L  ° Calcium 8.8 8.6 - 10.2 mg/dL  ° Total Protein 7.1 6.0 - 8.5 g/dL  ° Albumin 4.3 3.8 - 4.8 g/dL  ° Globulin, Total 2.8 1.5 - 4.5 g/dL  ° Albumin/Globulin Ratio 1.5 1.2 - 2.2  ° Bilirubin Total 0.5   0.0 - 1.2 mg/dL  ° Alkaline Phosphatase 118 44 - 121 IU/L  ° AST 17 0 - 40 IU/L  ° ALT 10 0 - 44 IU/L  °CBC with Differential/Platelet  °Result Value Ref Range  ° WBC 4.8 3.4 - 10.8 x10E3/uL  ° RBC 4.88 4.14 - 5.80 x10E6/uL  ° Hemoglobin 14.3 13.0 - 17.7 g/dL  ° Hematocrit 42.1 37.5 - 51.0 %  ° MCV 86 79 - 97 fL  ° MCH 29.3 26.6 - 33.0 pg  ° MCHC 34.0 31.5 - 35.7 g/dL  ° RDW 15.1 11.6 - 15.4 %  ° Platelets 154 150 - 450 x10E3/uL  ° Neutrophils 60 Not Estab. %  ° Lymphs 27 Not Estab. %  ° Monocytes 9 Not Estab. %  ° Eos 3 Not Estab. %  ° Basos 1 Not Estab. %  ° Neutrophils Absolute 2.9 1.4 - 7.0 x10E3/uL  ° Lymphocytes Absolute 1.3 0.7 - 3.1 x10E3/uL  ° Monocytes Absolute 0.4 0.1 - 0.9 x10E3/uL  ° EOS (ABSOLUTE) 0.1 0.0 - 0.4 x10E3/uL  ° Basophils Absolute 0.0 0.0 - 0.2 x10E3/uL  ° Immature Granulocytes 0 Not Estab. %  ° Immature Grans (Abs) 0.0 0.0 - 0.1 x10E3/uL  °Lipid Panel w/o Chol/HDL Ratio  °Result Value Ref Range  ° Cholesterol, Total 110 100 - 199 mg/dL  ° Triglycerides 70 0 - 149 mg/dL  ° HDL 62 >39 mg/dL  ° VLDL Cholesterol Cal 15 5 - 40 mg/dL  ° LDL Chol Calc (NIH) 33 0 - 99 mg/dL  °PSA  °Result Value Ref Range  ° Prostate Specific Ag, Serum  1.7 0.0 - 4.0 ng/mL  °TSH  °Result Value Ref Range  ° TSH 1.050 0.450 - 4.500 uIU/mL  °Urinalysis, Routine w reflex microscopic  °Result Value Ref Range  ° Specific Gravity, UA >1.030 (H) 1.005 - 1.030  ° pH, UA 5.5 5.0 - 7.5  ° Color, UA Amber (A) Yellow  ° Appearance Ur Cloudy (A) Clear  ° Leukocytes,UA Negative Negative  ° Protein,UA 1+ (A) Negative/Trace  ° Glucose, UA Negative Negative  ° Ketones, UA Trace (A) Negative  ° RBC, UA Negative Negative  ° Bilirubin, UA Negative Negative  ° Urobilinogen, Ur 1.0 0.2 - 1.0 mg/dL  ° Nitrite, UA Negative Negative  ° Microscopic Examination See below:   °Microalbumin, Urine Waived  °Result Value Ref Range  ° Microalb, Ur Waived 80 (H) 0 - 19 mg/L  ° Creatinine, Urine Waived 300 10 - 300 mg/dL  ° Microalb/Creat Ratio 30-300 (H) <30 mg/g  °B12  °Result Value Ref Range  ° Vitamin B-12 304 232 - 1,245 pg/mL  °HCV RNA quant  °Result Value Ref Range  ° Hepatitis C Quantitation WILL FOLLOW   ° HCV log10 WILL FOLLOW   ° Test Information Comment   ° °   °Assessment & Plan:  ° °Problem List Items Addressed This Visit   ° °  ° Cardiovascular and Mediastinum  ° Chronic systolic heart failure (HCC) (Chronic)  °  Euvolemic today. Continue to monitor. Call with any concerns.  °  °  ° Essential hypertension  °  Under good control on current regimen. Continue current regimen. Continue to monitor. Call with any concerns. Refills given. Labs drawn today.  ° °  °  ° Relevant Orders  ° Comprehensive metabolic panel (Completed)  ° CBC with Differential/Platelet (Completed)  ° Microalbumin, Urine Waived (Completed)  ° AAA (abdominal aortic aneurysm)  °  Continue to follow with vascular.   Call with any concerns.       AF (paroxysmal atrial fibrillation) (HCC)    Regular rhythm today. Continue to monitor. Call with any concerns.       Aortic atherosclerosis (HCC)    Will keep BP, sugars and cholesterol under good control. Continue to monitor.       Relevant Orders   Comprehensive  metabolic panel (Completed)   CBC with Differential/Platelet (Completed)     Respiratory   COPD (chronic obstructive pulmonary disease) ([place])    Under good control on current regimen. Continue current regimen. Continue to monitor. Call with any concerns. Refills given. Labs drawn today.        Relevant Orders   Comprehensive metabolic panel (Completed)   CBC with Differential/Platelet (Completed)     Digestive   Chronic hepatitis C without hepatic coma ([place])    S/p treatment. Checking labs today. Await results. Treat as needed.       Relevant Orders   Comprehensive metabolic panel (Completed)   CBC with Differential/Platelet (Completed)   HCV RNA quant (Completed)     Endocrine   Hyperthyroidism    Rechecking labs today. Await results. Treat as needed.       Relevant Orders   Comprehensive metabolic panel (Completed)   CBC with Differential/Platelet (Completed)   TSH (Completed)   Diabetes mellitus without complication (HCC)    Stable with A1c of 6.9. Continue current regimen. Call with any concerns. Continue to monitor.       Relevant Orders   Bayer DCA Hb A1c Waived (Completed)   Comprehensive metabolic panel (Completed)   CBC with Differential/Platelet (Completed)   Urinalysis, Routine w reflex microscopic (Completed)   Microalbumin, Urine Waived (Completed)   B12 (Completed)     Other   Hyperlipidemia    Under good control on current regimen. Continue current regimen. Continue to monitor. Call with any concerns. Refills given. Labs drawn today.       Relevant Orders   Comprehensive metabolic panel (Completed)   CBC with Differential/Platelet (Completed)   Lipid Panel w/o Chol/HDL Ratio (Completed)   Other Visit Diagnoses     Routine general medical examination at a health care facility    -  Primary   RUQ pain       Will check RUQ [place]. Await results. Treat as needed.    Relevant Orders   US Abdomen Limited RUQ (LIVER/GB)   Hesitancy       Labs drawn  today. Await results.    Relevant Orders   PSA (Completed)   Acute pain of both knees       Referral to ortho made today.   Relevant Orders   Ambulatory referral to Orthopedic Surgery   Needs flu shot       Flu shot given today.   Relevant Orders   Flu Vaccine QUAD High Dose(Fluad) (Completed)        Discussed aspirin prophylaxis for myocardial infarction prevention and decision was made to continue ASA  LABORATORY TESTING:  Health maintenance labs ordered today as discussed above.   The natural history of prostate cancer and ongoing controversy regarding screening and potential treatment outcomes of prostate cancer has been discussed with the patient. The meaning of a false positive PSA and a false negative PSA has been discussed. He indicates understanding of the limitations of this screening test and wishes to proceed with screening PSA testing.   IMMUNIZATIONS:   - Tdap: Tetanus vaccination status reviewed: last tetanus booster within  10 years. - Influenza: Administered today - Pneumovax: Up to date - Prevnar: Up to date - COVID: Refused - Shingrix vaccine: Given elsewhere  SCREENING: - Colonoscopy: Up to date  Discussed with patient purpose of the colonoscopy is to detect colon cancer at curable precancerous or early stages   PATIENT COUNSELING:    Sexuality: Discussed sexually transmitted diseases, partner selection, use of condoms, avoidance of unintended pregnancy  and contraceptive alternatives.   Advised to avoid cigarette smoking.  I discussed with the patient that most people either abstain from alcohol or drink within safe limits (<=14/week and <=4 drinks/occasion for males, <=7/weeks and <= 3 drinks/occasion for females) and that the risk for alcohol disorders and other health effects rises proportionally with the number of drinks per week and how often a drinker exceeds daily limits.  Discussed cessation/primary prevention of drug use and availability of  treatment for abuse.   Diet: Encouraged to adjust caloric intake to maintain  or achieve ideal body weight, to reduce intake of dietary saturated fat and total fat, to limit sodium intake by avoiding high sodium foods and not adding table salt, and to maintain adequate dietary potassium and calcium preferably from fresh fruits, vegetables, and low-fat dairy products.    stressed the importance of regular exercise  Injury prevention: Discussed safety belts, safety helmets, smoke detector, smoking near bedding or upholstery.   Dental health: Discussed importance of regular tooth brushing, flossing, and dental visits.   Follow up plan: NEXT PREVENTATIVE PHYSICAL DUE IN 1 YEAR. Return in about 3 months (around 09/16/2021).

## 2021-06-18 NOTE — Telephone Encounter (Signed)
Requested recent Diabetic Eye Exam.

## 2021-06-18 NOTE — Telephone Encounter (Signed)
-----   Message from Valerie Roys, DO sent at 06/18/2021  2:26 PM EST ----- [place] eye exam about 6 months ago per patient

## 2021-06-19 ENCOUNTER — Other Ambulatory Visit: Payer: Self-pay

## 2021-06-19 MED ORDER — METOPROLOL SUCCINATE ER 100 MG PO TB24: 100.0000 mg | ORAL_TABLET | Freq: Every day | ORAL | 0 refills | Status: DC

## 2021-06-19 NOTE — Assessment & Plan Note (Signed)
S/p treatment. Checking labs today. Await results. Treat as needed.

## 2021-06-19 NOTE — Assessment & Plan Note (Signed)
Will keep BP, sugars and cholesterol under good control. Continue to monitor.  

## 2021-06-19 NOTE — Assessment & Plan Note (Signed)
Continue to follow with vascular. Call with any concerns.  

## 2021-06-19 NOTE — Assessment & Plan Note (Signed)
Under good control on current regimen. Continue current regimen. Continue to monitor. Call with any concerns. Refills given. Labs drawn today.   

## 2021-06-19 NOTE — Assessment & Plan Note (Signed)
Euvolemic today. Continue to monitor. Call with any concerns.  

## 2021-06-19 NOTE — Assessment & Plan Note (Signed)
Regular rhythm today. Continue to monitor. Call with any concerns.  

## 2021-06-19 NOTE — Assessment & Plan Note (Signed)
Rechecking labs today. Await results. Treat as needed.  °

## 2021-06-19 NOTE — Assessment & Plan Note (Signed)
Stable with A1c of 6.9. Continue current regimen. Call with any concerns. Continue to monitor.

## 2021-06-21 LAB — CBC WITH DIFFERENTIAL/PLATELET
Basophils Absolute: 0 10*3/uL (ref 0.0–0.2)
Basos: 1 %
EOS (ABSOLUTE): 0.1 10*3/uL (ref 0.0–0.4)
Eos: 3 %
Hematocrit: 42.1 % (ref 37.5–51.0)
Hemoglobin: 14.3 g/dL (ref 13.0–17.7)
Immature Grans (Abs): 0 10*3/uL (ref 0.0–0.1)
Immature Granulocytes: 0 %
Lymphocytes Absolute: 1.3 10*3/uL (ref 0.7–3.1)
Lymphs: 27 %
MCH: 29.3 pg (ref 26.6–33.0)
MCHC: 34 g/dL (ref 31.5–35.7)
MCV: 86 fL (ref 79–97)
Monocytes Absolute: 0.4 10*3/uL (ref 0.1–0.9)
Monocytes: 9 %
Neutrophils Absolute: 2.9 10*3/uL (ref 1.4–7.0)
Neutrophils: 60 %
Platelets: 154 10*3/uL (ref 150–450)
RBC: 4.88 x10E6/uL (ref 4.14–5.80)
RDW: 15.1 % (ref 11.6–15.4)
WBC: 4.8 10*3/uL (ref 3.4–10.8)

## 2021-06-21 LAB — COMPREHENSIVE METABOLIC PANEL
ALT: 10 IU/L (ref 0–44)
AST: 17 IU/L (ref 0–40)
Albumin/Globulin Ratio: 1.5 (ref 1.2–2.2)
Albumin: 4.3 g/dL (ref 3.8–4.8)
Alkaline Phosphatase: 118 IU/L (ref 44–121)
BUN/Creatinine Ratio: 17 (ref 10–24)
BUN: 13 mg/dL (ref 8–27)
Bilirubin Total: 0.5 mg/dL (ref 0.0–1.2)
CO2: 22 mmol/L (ref 20–29)
Calcium: 8.8 mg/dL (ref 8.6–10.2)
Chloride: 101 mmol/L (ref 96–106)
Creatinine, Ser: 0.77 mg/dL (ref 0.76–1.27)
Globulin, Total: 2.8 g/dL (ref 1.5–4.5)
Glucose: 120 mg/dL — ABNORMAL HIGH (ref 70–99)
Potassium: 4.2 mmol/L (ref 3.5–5.2)
Sodium: 139 mmol/L (ref 134–144)
Total Protein: 7.1 g/dL (ref 6.0–8.5)
eGFR: 96 mL/min/{1.73_m2} (ref >59)

## 2021-06-21 LAB — VITAMIN B12: Vitamin B-12: 304 pg/mL (ref 232–1245)

## 2021-06-21 LAB — TSH: TSH: 1.05 u[IU]/mL (ref 0.450–4.500)

## 2021-06-21 LAB — LIPID PANEL W/O CHOL/HDL RATIO
Cholesterol, Total: 110 mg/dL (ref 100–199)
HDL: 62 mg/dL (ref >39)
LDL Chol Calc (NIH): 33 mg/dL (ref 0–99)
Triglycerides: 70 mg/dL (ref 0–149)
VLDL Cholesterol Cal: 15 mg/dL (ref 5–40)

## 2021-06-21 LAB — HCV RNA QUANT: Hepatitis C Quantitation: NOT DETECTED IU/mL

## 2021-06-21 LAB — PSA: Prostate Specific Ag, Serum: 1.7 ng/mL (ref 0.0–4.0)

## 2021-06-26 ENCOUNTER — Ambulatory Visit: Admission: RE | Admit: 2021-06-26 | Payer: Medicare HMO | Source: Ambulatory Visit

## 2021-06-29 ENCOUNTER — Ambulatory Visit: Payer: Medicare HMO

## 2021-07-06 ENCOUNTER — Ambulatory Visit: Payer: Medicare HMO

## 2021-07-10 ENCOUNTER — Other Ambulatory Visit: Payer: Self-pay | Admitting: Family Medicine

## 2021-07-10 NOTE — Telephone Encounter (Signed)
Requested Prescriptions  Pending Prescriptions Disp Refills   irbesartan (AVAPRO) 150 MG tablet [Pharmacy Med Name: IRBESARTAN 150 MG TABLET] 90 tablet 1    Sig: TAKE 1 TABLET BY MOUTH EVERY DAY     Cardiovascular:  Angiotensin Receptor Blockers Failed - 07/10/2021  1:58 AM      Failed - Last BP in normal range    BP Readings from Last 1 Encounters:  06/18/21 (!) 141/80         Passed - Cr in normal range and within 180 days    Creatinine, Ser  Date Value Ref Range Status  06/18/2021 0.77 0.76 - 1.27 mg/dL Final         Passed - K in normal range and within 180 days    Potassium  Date Value Ref Range Status  06/18/2021 4.2 3.5 - 5.2 mmol/L Final         Passed - Patient is not pregnant      Passed - Valid encounter within last 6 months    Recent Outpatient Visits          3 weeks ago Routine general medical examination at a health care facility   [facility], [place] P, DO   7 months ago COVID-19   [facility], [place] P, DO   8 months ago Diabetes mellitus without complication [facility])   [place], [place] P, DO   1 year ago Diabetes mellitus without complication [facility])   [place], [place], DO   2 years ago Essential hypertension   [place], [place], DO      Future Appointments            In 3 days  [facility], [place]   In 2 months [place], Megan P, DO [facility], [facility]

## 2021-07-13 ENCOUNTER — Ambulatory Visit: Payer: Medicare HMO

## 2021-07-16 ENCOUNTER — Ambulatory Visit: Payer: Medicare HMO

## 2021-07-27 DIAGNOSIS — M1731 Unilateral post-traumatic osteoarthritis, right knee: Secondary | ICD-10-CM | POA: Diagnosis not present

## 2021-07-27 DIAGNOSIS — M25561 Pain in right knee: Secondary | ICD-10-CM | POA: Insufficient documentation

## 2021-07-27 DIAGNOSIS — M1992 Post-traumatic osteoarthritis, unspecified site: Secondary | ICD-10-CM | POA: Insufficient documentation

## 2021-07-30 ENCOUNTER — Other Ambulatory Visit: Payer: Medicare HMO

## 2021-07-31 DIAGNOSIS — M1711 Unilateral primary osteoarthritis, right knee: Secondary | ICD-10-CM | POA: Diagnosis not present

## 2021-08-04 ENCOUNTER — Other Ambulatory Visit: Payer: Self-pay | Admitting: Cardiology

## 2021-08-04 ENCOUNTER — Ambulatory Visit
Admission: RE | Admit: 2021-08-04 | Discharge: 2021-08-04 | Disposition: A | Discharge status: Home / Self Care | Payer: Medicare HMO | Source: Ambulatory Visit | Attending: Cardiology | Admitting: Cardiology

## 2021-08-04 DIAGNOSIS — I712 Thoracic aortic aneurysm, without rupture, unspecified: Secondary | ICD-10-CM | POA: Diagnosis not present

## 2021-08-04 DIAGNOSIS — R1907 Generalized intra-abdominal and pelvic swelling, mass and lump: Secondary | ICD-10-CM

## 2021-08-04 DIAGNOSIS — K8020 Calculus of gallbladder without cholecystitis without obstruction: Secondary | ICD-10-CM | POA: Diagnosis not present

## 2021-08-04 DIAGNOSIS — I2510 Atherosclerotic heart disease of native coronary artery without angina pectoris: Secondary | ICD-10-CM | POA: Diagnosis not present

## 2021-08-04 DIAGNOSIS — I714 Abdominal aortic aneurysm, without rupture, unspecified: Secondary | ICD-10-CM

## 2021-08-04 MED ORDER — IOPAMIDOL (ISOVUE-370) INJECTION 76%
75.0000 mL | Freq: Once | INTRAVENOUS | Status: AC | PRN
  Administered 2021-08-04: 75 mL via INTRAVENOUS

## 2021-08-10 ENCOUNTER — Telehealth: Payer: Self-pay | Admitting: Cardiology

## 2021-08-10 DIAGNOSIS — I77810 Thoracic aortic ectasia: Secondary | ICD-10-CM

## 2021-08-10 NOTE — Telephone Encounter (Signed)
DPR on file. ?Patient daughter Toniann Fail made aware of results and Dr. Merita Norton recommendation. ?This test was ordered back in Sept 2022 and just completed. ?Previous referral to [facility] was cancelled. New referral placed. ? ?Tyrone Sage that [facility] will call her to scheduled the consult. ?Toniann Fail verbalized understanding and voiced appreciation for the call. ?

## 2021-08-10 NOTE — Telephone Encounter (Signed)
Eugene Odea, MD  [name], RN ?Cc: Jarvis Newcomer, RN ?Patient of Dr. Kirke Corin.  Severely dilated ascending aorta previously noted is confirmed.  Appears stable.  Was referred to CT surgery after clinic visit.  Keep appointment with CT surgery.  Follow-up with Dr. Kirke Corin ?

## 2021-08-17 ENCOUNTER — Telehealth: Payer: Self-pay | Admitting: Family Medicine

## 2021-08-17 NOTE — Telephone Encounter (Signed)
Copied from CRM [id number]. Topic: Medicare AWV ?>> Aug 17, 2021  3:00 PM Leigh Aurora wrote: ?Reason for CRM: ?N/A unable to leave a message for patient to call back and schedule the Medicare Annual Wellness Visit (AWV) virtually or by telephone. ? ?Last AWV 07/11/20 ? ?Please schedule at anytime with [facility] Health Advisor. ? ?45 minute appointment ? ?Any questions, please call me at [phone] ?

## 2021-08-18 ENCOUNTER — Telehealth: Payer: Self-pay | Admitting: Cardiovascular Disease

## 2021-08-18 NOTE — Telephone Encounter (Signed)
LVM to schedule

## 2021-08-18 NOTE — Telephone Encounter (Signed)
-----   Message from Jarvis Newcomer, RN sent at 08/18/2021  7:48 AM EDT ----- ?Regarding: Patient is past due fo f/u ?Per Dr. Azucena Cecil. ?Please call this patient to schedule a routine follow up appt. ?He is past due and has a history of non-compliance with no shows. ?Patient has seen Dr. Kirke Corin and Dr. Azucena Cecil in the past. ?Follow up should be under Dr. Kirke Corin. ? ? ?Thanks ? ? ?

## 2021-08-19 NOTE — Telephone Encounter (Addendum)
No ans vm full  ? ?

## 2021-08-24 ENCOUNTER — Other Ambulatory Visit: Payer: Self-pay | Admitting: Family Medicine

## 2021-08-24 DIAGNOSIS — H5202 Hypermetropia, left eye: Secondary | ICD-10-CM | POA: Diagnosis not present

## 2021-08-24 DIAGNOSIS — H52222 Regular astigmatism, left eye: Secondary | ICD-10-CM | POA: Diagnosis not present

## 2021-08-24 DIAGNOSIS — H16011 Central corneal ulcer, right eye: Secondary | ICD-10-CM | POA: Diagnosis not present

## 2021-08-24 DIAGNOSIS — Z7984 Long term (current) use of oral hypoglycemic drugs: Secondary | ICD-10-CM | POA: Diagnosis not present

## 2021-08-24 DIAGNOSIS — H524 Presbyopia: Secondary | ICD-10-CM | POA: Diagnosis not present

## 2021-08-24 DIAGNOSIS — E119 Type 2 diabetes mellitus without complications: Secondary | ICD-10-CM | POA: Diagnosis not present

## 2021-08-24 LAB — HM DIABETES EYE EXAM

## 2021-08-26 NOTE — Telephone Encounter (Signed)
Requested medication (s) are due for refill today: Yes  Requested medication (s) are on the active medication list: Yes  Last refill:  01/16/21  Future visit scheduled: Yes  Notes to clinic:  Unable to refill per protocol, last refill by another provider.      Requested Prescriptions  Pending Prescriptions Disp Refills   amLODipine (NORVASC) 5 MG tablet [Pharmacy Med Name: AMLODIPINE BESYLATE 5 MG TAB] 90 tablet 1    Sig: TAKE 1 TABLET (5 MG TOTAL) BY MOUTH DAILY.     Cardiovascular: Calcium Channel Blockers 2 Failed - 08/24/2021  7:56 PM      Failed - Last BP in normal range    BP Readings from Last 1 Encounters:  06/18/21 (!) 141/80          Passed - Last Heart Rate in normal range    Pulse Readings from Last 1 Encounters:  06/18/21 74          Passed - Valid encounter within last 6 months    Recent Outpatient Visits           2 months ago Routine general medical examination at a health care facility   [facility], Megan P, DO   9 months ago COVID-19   [facility], Megan P, DO   10 months ago Diabetes mellitus without complication [facility])   [facility] [place], Megan P, DO   1 year ago Diabetes mellitus without complication (HCC)   [facility] [place], Megan P, DO   2 years ago Essential hypertension   [facility] [place], [place], DO       Future Appointments             In 3 weeks Campanaro, Megan P, DO [facility], [facility]             digoxin (LANOXIN) 0.125 MG tablet [Pharmacy Med Name: DIGOXIN 125 MCG TABLET] 90 tablet 1    Sig: TAKE 1 TABLET BY MOUTH EVERY DAY     Cardiovascular:  Antiarrhythmic Agents - digoxin Failed - 08/24/2021  7:56 PM      Failed - Digoxin (serum) in normal range and within 360 days    Digoxin Level  Date Value Ref Range Status  07/14/2017 0.4 (L) 0.8 - 2.0 ng/mL Final    Comment:    Performed at [facility], [address] [place], [place] [address]          Failed - Mg Level in normal range and within 360 days    Magnesium  Date Value Ref Range Status  07/16/2017 1.7 1.7 - 2.4 mg/dL Final    Comment:    Performed at [facility], [address] [place], [place] [address]          Passed - Cr in normal range and within 180 days    Creatinine, Ser  Date Value Ref Range Status  06/18/2021 0.77 0.76 - 1.27 mg/dL Final          Passed - Ca in normal range and within 360 days    Calcium  Date Value Ref Range Status  06/18/2021 8.8 8.6 - 10.2 mg/dL Final          Passed - K in normal range and within 180 days    Potassium  Date Value Ref Range Status  06/18/2021 4.2 3.5 - 5.2 mmol/L Final          Passed -  Patient had ECG in the last 360 days      Passed - Last Heart Rate in normal range    Pulse Readings from Last 1 Encounters:  06/18/21 74          Passed - Valid encounter within last 6 months    Recent Outpatient Visits           2 months ago Routine general medical examination at a health care facility   [facility], Megan P, DO   9 months ago COVID-19   [facility], Megan P, DO   10 months ago Diabetes mellitus without complication [facility])   [facility] [place], Megan P, DO   1 year ago Diabetes mellitus without complication (HCC)   [facility] [place], [place], DO   2 years ago Essential hypertension   [facility] [place], [place], DO       Future Appointments             In 3 weeks [place], Megan P, DO [facility], [facility]            Signed Prescriptions Disp Refills   atorvastatin (LIPITOR) 40 MG tablet 90 tablet 2    Sig: TAKE 1 TABLET BY MOUTH EVERY DAY     Cardiovascular:  Antilipid - Statins Failed - 08/24/2021  7:56 PM      Failed - Lipid Panel in normal range within the last 12 months    Cholesterol, Total  Date Value Ref Range Status  06/18/2021 110  100 - 199 mg/dL Final   LDL Chol Calc (NIH)  Date Value Ref Range Status  06/18/2021 33 0 - 99 mg/dL Final   HDL  Date Value Ref Range Status  06/18/2021 62 >39 mg/dL Final   Triglycerides  Date Value Ref Range Status  06/18/2021 70 0 - 149 mg/dL Final         Passed - Patient is not pregnant      Passed - Valid encounter within last 12 months    Recent Outpatient Visits           2 months ago Routine general medical examination at a health care facility   [facility], [facility] P, DO   9 months ago COVID-19   [facility], Megan P, DO   10 months ago Diabetes mellitus without complication [facility])   [facility] [place], Megan P, DO   1 year ago Diabetes mellitus without complication [facility])   [facility] [place], [place], DO   2 years ago Essential hypertension   [facility] [place], [place], DO       Future Appointments             In 3 weeks [place], Megan P, DO [facility], [facility]             furosemide (LASIX) 40 MG tablet 90 tablet 1    Sig: TAKE 1 TABLET BY MOUTH EVERY DAY     Cardiovascular:  Diuretics - Loop Failed - 08/24/2021  7:56 PM      Failed - Mg Level in normal range and within 180 days    Magnesium  Date Value Ref Range Status  07/16/2017 1.7 1.7 - 2.4 mg/dL Final    Comment:    Performed at [facility], [address] [place], [place] [address]          Failed -  Last BP in normal range    BP Readings from Last 1 Encounters:  06/18/21 (!) 141/80          Passed - K in normal range and within 180 days    Potassium  Date Value Ref Range Status  06/18/2021 4.2 3.5 - 5.2 mmol/L Final          Passed - Ca in normal range and within 180 days    Calcium  Date Value Ref Range Status  06/18/2021 8.8 8.6 - 10.2 mg/dL Final          Passed - Na in normal range and within 180 days    Sodium  Date Value Ref Range Status  06/18/2021 139 134  - 144 mmol/L Final          Passed - Cr in normal range and within 180 days    Creatinine, Ser  Date Value Ref Range Status  06/18/2021 0.77 0.76 - 1.27 mg/dL Final          Passed - Cl in normal range and within 180 days    Chloride  Date Value Ref Range Status  06/18/2021 101 96 - 106 mmol/L Final          Passed - Valid encounter within last 6 months    Recent Outpatient Visits           2 months ago Routine general medical examination at a health care facility   [facility], Megan P, DO   9 months ago COVID-19   [facility], Megan P, DO   10 months ago Diabetes mellitus without complication [facility])   [facility] [place], Megan P, DO   1 year ago Diabetes mellitus without complication [facility])   [facility] [place], [place], DO   2 years ago Essential hypertension   [facility] [place], [place], DO       Future Appointments             In 3 weeks [place], Megan P, DO [facility], [facility]             spironolactone (ALDACTONE) 25 MG tablet 90 tablet 1    Sig: TAKE 1 TABLET (25 MG TOTAL) BY MOUTH DAILY.     Cardiovascular: Diuretics - Aldosterone Antagonist Failed - 08/24/2021  7:56 PM      Failed - Last BP in normal range    BP Readings from Last 1 Encounters:  06/18/21 (!) 141/80          Passed - Cr in normal range and within 180 days    Creatinine, Ser  Date Value Ref Range Status  06/18/2021 0.77 0.76 - 1.27 mg/dL Final          Passed - K in normal range and within 180 days    Potassium  Date Value Ref Range Status  06/18/2021 4.2 3.5 - 5.2 mmol/L Final          Passed - Na in normal range and within 180 days    Sodium  Date Value Ref Range Status  06/18/2021 139 134 - 144 mmol/L Final          Passed - eGFR is 30 or above and within 180 days    GFR calc Af Amer  Date Value Ref Range Status  03/28/2020 96 >59 mL/min/1.73 Final    Comment:    **In  accordance with recommendations from the NKF-ASN Task force,**  [facility] is in the process of updating its eGFR calculation to the   2021 CKD-EPI creatinine equation that estimates kidney function   without a race variable.    GFR calc non Af Amer  Date Value Ref Range Status  03/28/2020 83 >59 mL/min/1.73 Final   eGFR  Date Value Ref Range Status  06/18/2021 96 >59 mL/min/1.73 Final          Passed - Valid encounter within last 6 months    Recent Outpatient Visits           2 months ago Routine general medical examination at a health care facility   [facility], Megan P, DO   9 months ago COVID-19   [facility], [facility] P, DO   10 months ago Diabetes mellitus without complication [facility])   [facility] [place], Megan P, DO   1 year ago Diabetes mellitus without complication [facility])   [facility] [place], [place], DO   2 years ago Essential hypertension   [facility] [place], [place], DO       Future Appointments             In 3 weeks Laural Benes, Oralia Rud, DO [facility], [facility]

## 2021-08-26 NOTE — Telephone Encounter (Signed)
Requested Prescriptions  ?Pending Prescriptions Disp Refills  ?? amLODipine (NORVASC) 5 MG tablet [Pharmacy Med Name: AMLODIPINE BESYLATE 5 MG TAB] 90 tablet 1  ?  Sig: TAKE 1 TABLET (5 MG TOTAL) BY MOUTH DAILY.  ?  ? Cardiovascular: Calcium Channel Blockers 2 Failed - 08/24/2021  7:56 PM  ?  ?  Failed - Last BP in normal range  ?  BP Readings from Last 1 Encounters:  ?06/18/21 (!) 141/80  ?   ?  ?  Passed - Last Heart Rate in normal range  ?  Pulse Readings from Last 1 Encounters:  ?06/18/21 74  ?   ?  ?  Passed - Valid encounter within last 6 months  ?  Recent Outpatient Visits   ?      ? 2 months ago Routine general medical examination at a health care facility  ? [place], Megan P, DO  ? 9 months ago COVID-19  ? [place] P, DO  ? 10 months ago Diabetes mellitus without complication ([place])  ? [place], [place] P, DO  ? 1 year ago Diabetes mellitus without complication ([place])  ? [place], DO  ? 2 years ago Essential hypertension  ? [place], [place] P, DO  ?  ?  ?Future Appointments   ?        ? In 3 weeks K[place], Megan P, DO [place], [facility]  ?  ? ?  ?  ?  ?? atorvastatin (LIPITOR) 40 MG tablet [Pharmacy Med Name: ATORVASTATIN 40 MG TABLET] 90 tablet 2  ?  Sig: TAKE 1 TABLET BY MOUTH EVERY DAY  ?  ? Cardiovascular:  Antilipid - Statins Failed - 08/24/2021  7:56 PM  ?  ?  Failed - Lipid Panel in normal range within the last 12 months  ?  Cholesterol, Total  ?Date Value Ref Range Status  ?06/18/2021 110 100 - 199 mg/dL Final  ? ?LDL Chol Calc (NIH)  ?Date Value Ref Range Status  ?06/18/2021 33 0 - 99 mg/dL Final  ? ?HDL  ?Date Value Ref Range Status  ?06/18/2021 62 >39 mg/dL Final  ? ?Triglycerides  ?Date Value Ref Range Status  ?06/18/2021 70 0 - 149 mg/dL Final  ? ?  ?  ?  Passed - Patient is not pregnant  ?  ?  Passed - Valid encounter within last 12 months  ?  Recent  Outpatient Visits   ?      ? 2 months ago Routine general medical examination at a health care facility  ? [place], Megan P, DO  ? 9 months ago COVID-19  ? [place] P, DO  ? 10 months ago Diabetes mellitus without complication ([place])  ? [place], [place] P, DO  ? 1 year ago Diabetes mellitus without complication ([place])  ? [place], DO  ? 2 years ago Essential hypertension  ? [place], [place] P, DO  ?  ?  ?Future Appointments   ?        ? In 3 weeks Wynetta Emery, Megan P, DO [place], [facility]  ?  ? ?  ?  ?  ?? digoxin (LANOXIN) 0.125 MG tablet [Pharmacy Med Name: DIGOXIN 125 MCG TABLET] 90 tablet 1  ?  Sig: TAKE 1 TABLET BY MOUTH EVERY DAY  ?  ? Cardiovascular:  Antiarrhythmic Agents - digoxin Failed - 08/24/2021  7:56 PM  ?  ?  Failed - Digoxin (serum) in normal range and within 360 days  ?  Digoxin Level  ?Date Value Ref Range Status  ?07/14/2017 0.4 (L) 0.8 - 2.0 ng/mL Final  ?  Comment:  ?  Performed at [facility], [address] [place], [place] [address]  ?   ?  ?  Failed - Mg Level in normal range and within 360 days  ?  Magnesium  ?Date Value Ref Range Status  ?07/16/2017 1.7 1.7 - 2.4 mg/dL Final  ?  Comment:  ?  Performed at [facility], [address] [place], [place] [address]  ?   ?  ?  Passed - Cr in normal range and within 180 days  ?  Creatinine, Ser  ?Date Value Ref Range Status  ?06/18/2021 0.77 0.76 - 1.27 mg/dL Final  ?   ?  ?  Passed - Ca in normal range and within 360 days  ?  Calcium  ?Date Value Ref Range Status  ?06/18/2021 8.8 8.6 - 10.2 mg/dL Final  ?   ?  ?  Passed - K in normal range and within 180 days  ?  Potassium  ?Date Value Ref Range Status  ?06/18/2021 4.2 3.5 - 5.2 mmol/L Final  ?   ?  ?  Passed - Patient had ECG in the last 360 days  ?  ?  Passed - Last Heart Rate in normal range  ?  Pulse Readings from Last 1  Encounters:  ?06/18/21 74  ?   ?  ?  Passed - Valid encounter within last 6 months  ?  Recent Outpatient Visits   ?      ? 2 months ago Routine general medical examination at a health care facility  ? [place], Megan P, DO  ? 9 months ago COVID-19  ? [place] P, DO  ? 10 months ago Diabetes mellitus without complication ([place])  ? [place], [place] P, DO  ? 1 year ago Diabetes mellitus without complication ([place])  ? [place], DO  ? 2 years ago Essential hypertension  ? [place], [place] P, DO  ?  ?  ?Future Appointments   ?        ? In 3 weeks Wynetta Emery, Megan P, DO [place], [facility]  ?  ? ?  ?  ?  ?? furosemide (LASIX) 40 MG tablet [Pharmacy Med Name: FUROSEMIDE 40 MG TABLET] 90 tablet 1  ?  Sig: TAKE 1 TABLET BY MOUTH EVERY DAY  ?  ? Cardiovascular:  Diuretics - Loop Failed - 08/24/2021  7:56 PM  ?  ?  Failed - Mg Level in normal range and within 180 days  ?  Magnesium  ?Date Value Ref Range Status  ?07/16/2017 1.7 1.7 - 2.4 mg/dL Final  ?  Comment:  ?  Performed at [facility], [address], [place], [place] [address]  ?   ?  ?  Failed - Last BP in normal range  ?  BP Readings from Last 1 Encounters:  ?06/18/21 (!) 141/80  ?   ?  ?  Passed - K in normal range and within 180 days  ?  Potassium  ?Date Value Ref Range Status  ?06/18/2021 4.2 3.5 - 5.2 mmol/L Final  ?   ?  ?  Passed - Ca in normal range and within 180 days  ?  Calcium  ?Date Value Ref Range Status  ?06/18/2021 8.8 8.6 - 10.2 mg/dL Final  ?   ?  ?  Passed - Na in normal range and within 180 days  ?  Sodium  ?Date Value Ref Range Status  ?06/18/2021 139 134 - 144 mmol/L Final  ?   ?  ?  Passed - Cr in normal range and within 180 days  ?  Creatinine, Ser  ?Date Value Ref Range Status  ?06/18/2021 0.77 0.76 - 1.27 mg/dL Final  ?   ?  ?  Passed - Cl in normal range and within 180 days  ?  Chloride  ?Date  Value Ref Range Status  ?06/18/2021 101 96 - 106 mmol/L Final  ?   ?  ?  Passed - Valid encounter within last 6 months  ?  Recent Outpatient Visits   ?      ? 2 months ago Routine general medical examination at a health care facility  ? [place], Megan P, DO  ? 9 months ago COVID-19  ? [place] P, DO  ? 10 months ago Diabetes mellitus without complication ([place])  ? [place], [place] P, DO  ? 1 year ago Diabetes mellitus without complication ([place])  ? [place], DO  ? 2 years ago Essential hypertension  ? [place], [place] P, DO  ?  ?  ?Future Appointments   ?        ? In 3 weeks Wynetta Emery, Megan P, DO [facility], [facility]  ?  ? ?  ?  ?  ?? spironolactone (ALDACTONE) 25 MG tablet [Pharmacy Med Name: SPIRONOLACTONE 25 MG TABLET] 90 tablet 1  ?  Sig: TAKE 1 TABLET (25 MG TOTAL) BY MOUTH DAILY.  ?  ? Cardiovascular: Diuretics - Aldosterone Antagonist Failed - 08/24/2021  7:56 PM  ?  ?  Failed - Last BP in normal range  ?  BP Readings from Last 1 Encounters:  ?06/18/21 (!) 141/80  ?   ?  ?  Passed - Cr in normal range and within 180 days  ?  Creatinine, Ser  ?Date Value Ref Range Status  ?06/18/2021 0.77 0.76 - 1.27 mg/dL Final  ?   ?  ?  Passed - K in normal range and within 180 days  ?  Potassium  ?Date Value Ref Range Status  ?06/18/2021 4.2 3.5 - 5.2 mmol/L Final  ?   ?  ?  Passed - Na in normal range and within 180 days  ?  Sodium  ?Date Value Ref Range Status  ?06/18/2021 139 134 - 144 mmol/L Final  ?   ?  ?  Passed - eGFR is 30 or above and within 180 days  ?  GFR calc Af Amer  ?Date Value Ref Range Status  ?03/28/2020 96 >59 mL/min/1.73 Final  ?  Comment:  ?  **In accordance with recommendations from the NKF-ASN Task force,** ?  [facility] is in the process of updating its eGFR calculation to the ?  2021 CKD-EPI creatinine equation that estimates kidney function ?  without a race  variable. ?  ? ?GFR calc non Af Amer  ?Date Value Ref Range Status  ?03/28/2020 83 >59 mL/min/1.73 Final  ? ?eGFR  ?Date Value Ref Range Status  ?06/18/2021 96 >59 mL/min/1.73 Final  ?   ?  ?  Passed - Valid encounter

## 2021-08-28 ENCOUNTER — Encounter: Payer: Medicare HMO | Admitting: Thoracic Surgery (Cardiothoracic Vascular Surgery)

## 2021-09-04 ENCOUNTER — Encounter: Payer: Medicare HMO | Admitting: Thoracic Surgery (Cardiothoracic Vascular Surgery)

## 2021-09-11 ENCOUNTER — Other Ambulatory Visit: Payer: Self-pay | Admitting: Family Medicine

## 2021-09-11 NOTE — Telephone Encounter (Signed)
Requested medication (s) are due for refill today: yes ? ?Requested medication (s) are on the active medication list: yes ? ?Last refill:  04/15/21 ? ?Future visit scheduled: yes ? ?Notes to clinic:  Unable to refill per protocol, last refill by another provider.  ? ? ?  ?Requested Prescriptions  ?Pending Prescriptions Disp Refills  ? digoxin (LANOXIN) 0.125 MG tablet [Pharmacy Med Name: DIGOXIN 125 MCG TABLET] 90 tablet 1  ?  Sig: TAKE 1 TABLET BY MOUTH EVERY DAY  ?  ? Cardiovascular:  Antiarrhythmic Agents - digoxin Failed - 09/11/2021 12:29 AM  ?  ?  Failed - Digoxin (serum) in normal range and within 360 days  ?  Digoxin Level  ?Date Value Ref Range Status  ?07/14/2017 0.4 (L) 0.8 - 2.0 ng/mL Final  ?  Comment:  ?  Performed at [facility], [address], [place], [place] [address]  ?  ?  ?  ?  Failed - Mg Level in normal range and within 360 days  ?  Magnesium  ?Date Value Ref Range Status  ?07/16/2017 1.7 1.7 - 2.4 mg/dL Final  ?  Comment:  ?  Performed at [facility], [address] [place], [place] [address]  ?  ?  ?  ?  Passed - Cr in normal range and within 180 days  ?  Creatinine, Ser  ?Date Value Ref Range Status  ?06/18/2021 0.77 0.76 - 1.27 mg/dL Final  ?  ?  ?  ?  Passed - Ca in normal range and within 360 days  ?  Calcium  ?Date Value Ref Range Status  ?06/18/2021 8.8 8.6 - 10.2 mg/dL Final  ?  ?  ?  ?  Passed - K in normal range and within 180 days  ?  Potassium  ?Date Value Ref Range Status  ?06/18/2021 4.2 3.5 - 5.2 mmol/L Final  ?  ?  ?  ?  Passed - Patient had ECG in the last 360 days  ?  ?  Passed - Last Heart Rate in normal range  ?  Pulse Readings from Last 1 Encounters:  ?06/18/21 74  ?  ?  ?  ?  Passed - Valid encounter within last 6 months  ?  Recent Outpatient Visits   ? ?      ? 2 months ago Routine general medical examination at a health care facility  ? [place], Megan P, DO  ? 9 months ago COVID-19  ? [place]  P, DO  ? 10 months ago Diabetes mellitus without complication ([place])  ? [place], [place] P, DO  ? 1 year ago Diabetes mellitus without complication ([place])  ? [place], DO  ? 2 years ago Essential hypertension  ? [place], [place] P, DO  ? ?  ?  ?Future Appointments   ? ?        ? In 6 days Valerie Roys, DO [facility], [facility]  ? ?  ? ?  ?  ?  ?Signed Prescriptions Disp Refills  ? metFORMIN (GLUCOPHAGE-XR) 500 MG 24 hr tablet 180 tablet 0  ?  Sig: TAKE 1 TABLET (500 MG TOTAL) BY MOUTH IN THE MORNING AND AT BEDTIME.  ?  ? Endocrinology:  Diabetes - Biguanides Passed - 09/11/2021 12:29 AM  ?  ?  Passed - Cr in normal range and within 360 days  ?  Creatinine, Ser  ?Date Value Ref Range Status  ?06/18/2021 0.77 0.76 - 1.27 mg/dL Final  ?  ?  ?  ?  Passed - HBA1C is between 0 and 7.9 and within 180 days  ?  HB A1C (BAYER DCA - WAIVED)  ?Date Value Ref Range Status  ?06/18/2021 6.9 (H) 4.8 - 5.6 % Final  ?  Comment:  ?           Prediabetes: 5.7 - 6.4 ?         Diabetes: >6.4 ?         Glycemic control for adults with diabetes: <7.0 ?  ?  ?  ?  ?  Passed - eGFR in normal range and within 360 days  ?  GFR calc Af Amer  ?Date Value Ref Range Status  ?03/28/2020 96 >59 mL/min/1.73 Final  ?  Comment:  ?  **In accordance with recommendations from the NKF-ASN Task force,** ?  [facility] is in the process of updating its eGFR calculation to the ?  2021 CKD-EPI creatinine equation that estimates kidney function ?  without a race variable. ?  ? ?GFR calc non Af Amer  ?Date Value Ref Range Status  ?03/28/2020 83 >59 mL/min/1.73 Final  ? ?eGFR  ?Date Value Ref Range Status  ?06/18/2021 96 >59 mL/min/1.73 Final  ?  ?  ?  ?  Passed - B12 Level in normal range and within 720 days  ?  Vitamin B-12  ?Date Value Ref Range Status  ?06/18/2021 304 232 - 1,245 pg/mL Final  ?  ?  ?  ?  Passed - Valid encounter within last 6 months  ?  Recent Outpatient Visits    ? ?      ? 2 months ago Routine general medical examination at a health care facility  ? [place], Megan P, DO  ? 9 months ago COVID-19  ? [place] P, DO  ? 10 months ago Diabetes mellitus without complication ([place])  ? [place], [place] P, DO  ? 1 year ago Diabetes mellitus without complication ([place])  ? [place], DO  ? 2 years ago Essential hypertension  ? [place], [place] P, DO  ? ?  ?  ?Future Appointments   ? ?        ? In 6 days Wynetta Emery, Megan P, DO [facility], [facility]  ? ?  ? ?  ?  ?  Passed - CBC within normal limits and completed in the last 12 months  ?  WBC  ?Date Value Ref Range Status  ?06/18/2021 4.8 3.4 - 10.8 x10E3/uL Final  ?08/27/2019 4.8 4.0 - 10.5 K/uL Final  ? ?RBC  ?Date Value Ref Range Status  ?06/18/2021 4.88 4.14 - 5.80 x10E6/uL Final  ?08/27/2019 4.12 (L) 4.22 - 5.81 MIL/uL Final  ? ?Hemoglobin  ?Date Value Ref Range Status  ?06/18/2021 14.3 13.0 - 17.7 g/dL Final  ? ?Hematocrit  ?Date Value Ref Range Status  ?06/18/2021 42.1 37.5 - 51.0 % Final  ? ?MCHC  ?Date Value Ref Range Status  ?06/18/2021 34.0 31.5 - 35.7 g/dL Final  ?08/27/2019 33.5 30.0 - 36.0 g/dL Final  ? ?MCH  ?Date Value Ref Range Status  ?06/18/2021 29.3 26.6 - 33.0 pg Final  ?08/27/2019 30.8 26.0 - 34.0 pg Final  ? ?MCV  ?Date Value Ref Range Status  ?06/18/2021 86 79 - 97 fL Final  ? ?  No results found for: PLTCOUNTKUC, LABPLAT, [place] ?RDW  ?Date Value Ref Range Status  ?06/18/2021 15.1 11.6 - 15.4 % Final  ? ?  ?  ?  ? ? ?

## 2021-09-17 ENCOUNTER — Ambulatory Visit: Payer: Medicare HMO | Admitting: Family Medicine

## 2021-09-23 ENCOUNTER — Other Ambulatory Visit: Payer: Self-pay | Admitting: Family Medicine

## 2021-09-24 NOTE — Telephone Encounter (Signed)
Requested Prescriptions  ?Pending Prescriptions Disp Refills  ?? folic acid (FOLVITE) 1 MG tablet [Pharmacy Med Name: FOLIC ACID 1 MG TABLET] 90 tablet 1  ?  Sig: TAKE 1 TABLET BY MOUTH EVERY DAY  ?  ? Endocrinology:  Vitamins Passed - 09/23/2021  6:10 PM  ?  ?  Passed - Valid encounter within last 12 months  ?  Recent Outpatient Visits   ?      ? 3 months ago Routine general medical examination at a health care facility  ? [place], Megan P, DO  ? 10 months ago COVID-19  ? [place] P, DO  ? 10 months ago Diabetes mellitus without complication ([place])  ? [place], [place] P, DO  ? 1 year ago Diabetes mellitus without complication ([place])  ? [place], DO  ? 2 years ago Essential hypertension  ? [place], [place] P, DO  ?  ?  ? ?  ?  ?  ? ?

## 2021-10-01 NOTE — Progress Notes (Unsigned)
? ?   ?[address] ?      [place] [address] ?            [phone]       ? ?Traci Sermon ?[place] Record [id number] ?Date of Birth: 12-28-1950 ? ?Referring: Valerie Roys, DO ?Primary Care: Valerie Roys, DO ?Primary Cardiologist:Muhammad Fletcher Anon, MD ? ?Chief Complaint:   No chief complaint on file. ? ? ?History of Present Illness:     ?*** ? ? ?Past Medical and Surgical History: ?Previous Chest Surgery: *** ?Previous Chest Radiation: *** ?Diabetes Mellitus: ***.  HbA1C *** ?Creatinine: *** ? ?Past Medical History:  ?Diagnosis Date  ? Arthritis   ? Ascending aortic aneurysm   ? a. 09/2017 Stable TAA - 5.1cm.  ? Asthma   ? Chronic systolic CHF (congestive heart failure) ([place])   ? a. EF 25-30% by echo in 07/2016 with cath showing no significant CAD b. 01/2017: EF 30-35% with diffuse HK and moderate MR; c. 07/2017 Echo: EF 30-35%, diff hK. Mild MR, mildly dil LA. PASP 61mmHg.  ? Diverticulitis   ? Diverticulitis of large intestine with perforation without abscess or bleeding 05/13/2017  ? Hypertension   ? Hyperthyroidism   ? NICM (nonischemic cardiomyopathy) ([place])   ? Noncompliance   ? Persistent atrial fibrillation ([place])   ? a. CHA2DS2VASc = 3-->Eliquis (? compliance).  ? ? ?Past Surgical History:  ?Procedure Laterality Date  ? COLONOSCOPY N/A 08/27/2019  ? Procedure: COLONOSCOPY;  Surgeon: Toledo, Benay Pike, MD;  Location: [facility];  Service: Gastroenterology;  Laterality: N/A;  ? JOINT REPLACEMENT Right   ? RIGHT/LEFT HEART CATH AND CORONARY ANGIOGRAPHY N/A 08/02/2016  ? Procedure: Right/Left Heart Cath and Coronary Angiography;  Surgeon: Wellington Hampshire, MD;  Location: [facility];  Service: Cardiovascular;  Laterality: N/A;  ? TESTICLE SURGERY    ? Patient states that he had to have the tube fixed.  ? ? ?Social History: ?Support: *** ? ?Social History  ? ?Tobacco Use  ?Smoking Status Some Days  ? Packs/day: 0.25  ? Years: 25.00  ? Pack years: 6.25  ? Types: Pipe,  Cigarettes  ?Smokeless Tobacco Never  ?Tobacco Comments  ? pipe tobacco   ?  ?Social History  ? ?Substance and Sexual Activity  ?Alcohol Use Yes  ? Comment: Socially - once a month  ? ? ? ?No Known Allergies ? ?Medications: ?Asprin: *** ?Statin: *** ?Beta Blocker: *** ?Ace Inhibitor: *** ?Anti-Coagulation: *** ? ?Current Outpatient Medications  ?Medication Sig Dispense Refill  ? acetaminophen (TYLENOL) 500 MG tablet Take 500-1,000 mg by mouth every 6 (six) hours as needed for mild pain, fever or headache.     ? albuterol (VENTOLIN HFA) 108 (90 Base) MCG/ACT inhaler TAKE 2 PUFFS BY MOUTH EVERY 6 HOURS AS NEEDED FOR WHEEZE OR SHORTNESS OF BREATH 6.7 each 3  ? amLODipine (NORVASC) 5 MG tablet TAKE 1 TABLET (5 MG TOTAL) BY MOUTH DAILY. 90 tablet 0  ? atorvastatin (LIPITOR) 40 MG tablet TAKE 1 TABLET BY MOUTH EVERY DAY 90 tablet 2  ? budesonide-formoterol (SYMBICORT) 160-4.5 MCG/ACT inhaler TAKE 2 PUFFS BY MOUTH TWICE A DAY 30.6 each 1  ? digoxin (LANOXIN) 0.125 MG tablet     ? folic acid (FOLVITE) 1 MG tablet TAKE 1 TABLET BY MOUTH EVERY DAY 90 tablet 0  ? furosemide (LASIX) 40 MG tablet TAKE 1 TABLET BY MOUTH EVERY DAY 90 tablet 1  ? hydrocortisone (ANUSOL-HC) 25 MG suppository One suppository  twice a day as needed for hemmorhoids, (okay to substitute generic) 24 suppository 1  ? irbesartan (AVAPRO) 150 MG tablet TAKE 1 TABLET BY MOUTH EVERY DAY 90 tablet 1  ? KLOR-CON M20 20 MEQ tablet TAKE 1 TABLET BY MOUTH EVERY DAY 90 tablet 1  ? metFORMIN (GLUCOPHAGE-XR) 500 MG 24 hr tablet TAKE 1 TABLET (500 MG TOTAL) BY MOUTH IN THE MORNING AND AT BEDTIME. 180 tablet 0  ? metoprolol succinate (TOPROL-XL) 100 MG 24 hr tablet Take 1 tablet (100 mg total) by mouth daily. TAKE WITH OR IMMEDIATELY FOLLOWING A MEAL.PLEASE CALL OFFICE TO SCHEDULE FOLLOW  UP APPOINTMENT 15 tablet 0  ? spironolactone (ALDACTONE) 25 MG tablet TAKE 1 TABLET (25 MG TOTAL) BY MOUTH DAILY. 90 tablet 1  ? thiamine 100 MG tablet Take 1 tablet (100 mg total) by  mouth daily. 90 tablet 1  ? ?No current facility-administered medications for this visit.  ? ? ?(Not in a hospital admission) ? ? ?Family History  ?Problem Relation Age of Onset  ? Diabetes Mother   ? Diabetes Father   ? Heart disease Brother   ? Heart attack Brother   ? Diabetes Brother   ? Kidney failure Brother   ? Thyroid disease Neg Hx   ? ? ? ?Review of Systems:  ? ?ROS ?  ? ?Physical Exam: ?There were no vitals taken for this visit. ?Physical Exam  ? ? ?Diagnostic Studies & Laboratory data: ?   ?Left Heart Catherization: ?*** ?Echo: ?IMPRESSIONS  ? ? ? 1. Left ventricular ejection fraction, by estimation, is 30%. The left  ?ventricle has severely decreased function. The left ventricle demonstrates  ?global hypokinesis. The left ventricular internal cavity size was mildly  ?dilated. There is mild left  ?ventricular hypertrophy. Left ventricular diastolic parameters are  ?indeterminate.  ? 2. Right ventricular systolic function is low normal. The right  ?ventricular size is normal.  ? 3. Left atrial size was severely dilated.  ? 4. Right atrial size was severely dilated.  ? 5. The mitral valve is normal in structure. Mild mitral valve  ?regurgitation.  ? 6. The aortic valve was not well visualized. Aortic valve regurgitation  ?is not visualized.  ? 7. Aortic dilatation noted. There is severe dilatation of the aortic root  ?and of the ascending aorta, measuring 55 mm.  ?CT chest: ?Thoracic aortic measurements: ?  ?-Sinotubular junction ?49 mm as measured in greatest oblique short axis coronal dimension. ?  ?-Proximal ascending aorta ?53 mm as measured in greatest oblique short axis axial dimension at ?the level of the main pulmonary artery (image 51, series 4) and ?approximately 50 mm in greatest oblique short axis coronal diameter ?(coronal image 75, series 7), unchanged compared to the 05/2016 ?examination by my direct remeasurement. ?  ?-Aortic arch aorta ?39 mm as measured in greatest oblique short axis  sagittal dimension ? ? ?I have independently reviewed the above radiologic studies and discussed with the patient  ? ?Recent Lab Findings: ?Lab Results  ?Component Value Date  ? WBC 4.8 06/18/2021  ? HGB 14.3 06/18/2021  ? HCT 42.1 06/18/2021  ? PLT 154 06/18/2021  ? GLUCOSE 120 (H) 06/18/2021  ? CHOL 110 06/18/2021  ? TRIG 70 06/18/2021  ? HDL 62 06/18/2021  ? [place] 33 06/18/2021  ? ALT 10 06/18/2021  ? AST 17 06/18/2021  ? NA 139 06/18/2021  ? K 4.2 06/18/2021  ? CL 101 06/18/2021  ? CREATININE 0.77 06/18/2021  ? BUN 13 06/18/2021  ?  CO2 22 06/18/2021  ? TSH 1.050 06/18/2021  ? INR 1.2 08/25/2019  ? HGBA1C 6.9 (H) 06/18/2021  ? ? ? ? 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  The ascending aorta measures 5.3 cm, and the root measures 4.9 cm.  It does appear to taper at the arch.  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  He also has a history of atrial fibrillation, and his echo shows that his left atrium is severely dilated.  We recommend that the patient undergoes aortic root replacement with a hemiarch.  He would also require an atrial clip.  I do not think that an ablation procedure would be beneficial in this setting. ? ? ? ? ?I  spent {CHL ONC TIME VISIT - [id number] counseling the patient face to face. ? ? ?Lucile Crater Kyna Blahnik ?10/01/2021 12:31 PM ? ? ? ? ? ? ?

## 2021-10-02 ENCOUNTER — Institutional Professional Consult (permissible substitution): Payer: Medicare HMO | Admitting: Thoracic Surgery (Cardiothoracic Vascular Surgery)

## 2021-10-02 ENCOUNTER — Encounter: Payer: Medicare HMO | Admitting: Medical

## 2021-10-02 ENCOUNTER — Encounter: Payer: Self-pay | Admitting: Medical

## 2021-10-02 VITALS — BP 116/84 | HR 94 | Ht 72.0 in | Wt 270.0 lb

## 2021-10-02 DIAGNOSIS — I4821 Permanent atrial fibrillation: Secondary | ICD-10-CM

## 2021-10-02 DIAGNOSIS — F17200 Nicotine dependence, unspecified, uncomplicated: Secondary | ICD-10-CM

## 2021-10-02 DIAGNOSIS — I5022 Chronic systolic (congestive) heart failure: Secondary | ICD-10-CM

## 2021-10-02 DIAGNOSIS — I5020 Unspecified systolic (congestive) heart failure: Secondary | ICD-10-CM

## 2021-10-02 DIAGNOSIS — Z91148 Patient's other noncompliance with medication regimen for other reason: Secondary | ICD-10-CM

## 2021-10-02 DIAGNOSIS — I714 Abdominal aortic aneurysm, without rupture, unspecified: Secondary | ICD-10-CM

## 2021-10-02 DIAGNOSIS — M1731 Unilateral post-traumatic osteoarthritis, right knee: Secondary | ICD-10-CM | POA: Diagnosis not present

## 2021-10-02 DIAGNOSIS — I77810 Thoracic aortic ectasia: Secondary | ICD-10-CM

## 2021-10-02 DIAGNOSIS — I10 Essential (primary) hypertension: Secondary | ICD-10-CM

## 2021-10-02 DIAGNOSIS — K922 Gastrointestinal hemorrhage, unspecified: Secondary | ICD-10-CM

## 2021-10-02 DIAGNOSIS — Z789 Other specified health status: Secondary | ICD-10-CM

## 2021-10-02 MED ORDER — EMPAGLIFLOZIN 10 MG PO TABS: 10.0000 mg | ORAL_TABLET | Freq: Every day | ORAL | 5 refills | Status: DC

## 2021-10-02 NOTE — Progress Notes (Signed)
?Cardiology Office Note:   ? ?Date:  10/02/2021  ? ?ID:  Eugene Henderson, DOB 1950-07-13, MRN [id number] ? ?PCP:  Valerie Roys, DO  ?[facility] Cardiologist:  Kathlyn Sacramento, MD  ?[facility] Electrophysiologist:  None  ? ?Referring MD: Valerie Roys, DO  ? ?Chief Complaint: overdue follow-up ? ?History of Present Illness:   ? ?Eugene Henderson is a 71 y.o. male with a hx of NICM, HFrEF, persistent Afib, HTN, noncompliance, alcohol use, Ascending Aortic aneurysm followed by [facility] who presents for follow-up.  ? ?Echo 07/2016 showed LVEF 25-30%, diffuse HK. Prior cardiac cath in February 2018 which showed no significant CAD. CT chest showed ascending aortic aneurysm at 5.1cm. Follow-up echo 07/2017 showed EF 30-35% ? ?H/o persistent Afib, previously on Eliquis, however this was stopped after a GIB requiring transfusion. CHADSVASC of 4 ? ?Last seen 11/2020 to re-establish care. Toprol was increased. He was referred to CT surgery for AAA. Recommended GI follow-up to comment on resumption of Eliquis.  ? ?Today, the patient reports he didn't go the the CT surgery appointment (the note suggests they may consider surgery-if he were to be seen). HE had a knee apt this AM and is planning on knee surgery in July.  He will have to re-schedule CT apt. EKG shows Afib with controlled rates. He is off Eliquis, did not get GI work-up. He denies further bleeding. Hgb in January was stable. NO chest pain, SOB, LLE. Unsure of his medications, his daughter does these. Daughter says he still takes amlodipine and digoxin, need to stop these. He drinks alcohol Thursday to Sunday.  ? ?Past Medical History:  ?Diagnosis Date  ? Arthritis   ? Ascending aortic aneurysm ([place])   ? a. 09/2017 Stable TAA - 5.1cm.  ? Asthma   ? Chronic systolic CHF (congestive heart failure) ([place])   ? a. EF 25-30% by echo in 07/2016 with cath showing no significant CAD b. 01/2017: EF 30-35% with diffuse HK and moderate MR; c. 07/2017 Echo: EF 30-35%, diff hK. Mild  MR, mildly dil LA. PASP 37mmHg.  ? Diverticulitis   ? Diverticulitis of large intestine with perforation without abscess or bleeding 05/13/2017  ? Hypertension   ? Hyperthyroidism   ? NICM (nonischemic cardiomyopathy) ([place])   ? Noncompliance   ? Persistent atrial fibrillation ([place])   ? a. CHA2DS2VASc = 3-->Eliquis (? compliance).  ? ? ?Past Surgical History:  ?Procedure Laterality Date  ? COLONOSCOPY N/A 08/27/2019  ? Procedure: COLONOSCOPY;  Surgeon: Toledo, Benay Pike, MD;  Location: [facility];  Service: Gastroenterology;  Laterality: N/A;  ? JOINT REPLACEMENT Right   ? RIGHT/LEFT HEART CATH AND CORONARY ANGIOGRAPHY N/A 08/02/2016  ? Procedure: Right/Left Heart Cath and Coronary Angiography;  Surgeon: Wellington Hampshire, MD;  Location: [facility];  Service: Cardiovascular;  Laterality: N/A;  ? TESTICLE SURGERY    ? Patient states that he had to have the tube fixed.  ? ? ?Current Medications: ?Current Meds  ?Medication Sig  ? acetaminophen (TYLENOL) 500 MG tablet Take 500-1,000 mg by mouth every 6 (six) hours as needed for mild pain, fever or headache.   ? albuterol (VENTOLIN HFA) 108 (90 Base) MCG/ACT inhaler TAKE 2 PUFFS BY MOUTH EVERY 6 HOURS AS NEEDED FOR WHEEZE OR SHORTNESS OF BREATH  ? atorvastatin (LIPITOR) 40 MG tablet TAKE 1 TABLET BY MOUTH EVERY DAY  ? budesonide-formoterol (SYMBICORT) 160-4.5 MCG/ACT inhaler TAKE 2 PUFFS BY MOUTH TWICE A DAY  ? budesonide-formoterol (SYMBICORT) 160-4.5 MCG/ACT inhaler  Inhale 2 puffs into the lungs in the morning and at bedtime.  ? empagliflozin (JARDIANCE) 10 MG TABS tablet Take 1 tablet (10 mg total) by mouth daily before breakfast.  ? folic acid (FOLVITE) 1 MG tablet TAKE 1 TABLET BY MOUTH EVERY DAY  ? furosemide (LASIX) 40 MG tablet TAKE 1 TABLET BY MOUTH EVERY DAY  ? hydrocortisone (ANUSOL-HC) 25 MG suppository One suppository twice a day as needed for hemmorhoids, (okay to substitute generic)  ? irbesartan (AVAPRO) 150 MG tablet TAKE 1 TABLET BY MOUTH EVERY  DAY  ? KLOR-CON M20 20 MEQ tablet TAKE 1 TABLET BY MOUTH EVERY DAY  ? metFORMIN (GLUCOPHAGE-XR) 500 MG 24 hr tablet TAKE 1 TABLET (500 MG TOTAL) BY MOUTH IN THE MORNING AND AT BEDTIME.  ? metoprolol succinate (TOPROL-XL) 100 MG 24 hr tablet Take 1 tablet (100 mg total) by mouth daily. TAKE WITH OR IMMEDIATELY FOLLOWING A MEAL.PLEASE CALL OFFICE TO SCHEDULE FOLLOW  UP APPOINTMENT  ? spironolactone (ALDACTONE) 25 MG tablet TAKE 1 TABLET (25 MG TOTAL) BY MOUTH DAILY.  ? thiamine 100 MG tablet Take 1 tablet (100 mg total) by mouth daily.  ? [DISCONTINUED] amLODipine (NORVASC) 5 MG tablet TAKE 1 TABLET (5 MG TOTAL) BY MOUTH DAILY.  ? [DISCONTINUED] digoxin (LANOXIN) 0.125 MG tablet Take 0.125 mg by mouth.  ?  ? ?Allergies:   Patient has no known allergies.  ? ?Social History  ? ?Socioeconomic History  ? Marital status: Single  ?  Spouse name: Not on file  ? Number of children: Not on file  ? Years of education: Not on file  ? Highest education level: Not on file  ?Occupational History  ? Occupation: retired  ?Tobacco Use  ? Smoking status: Some Days  ?  Packs/day: 0.25  ?  Years: 25.00  ?  Pack years: 6.25  ?  Types: Pipe, Cigarettes  ? Smokeless tobacco: Never  ? Tobacco comments:  ?  pipe tobacco   ?Vaping Use  ? Vaping Use: Never used  ?Substance and Sexual Activity  ? Alcohol use: Yes  ?  Comment: Socially - once a month  ? Drug use: No  ? Sexual activity: Not Currently  ?Other Topics Concern  ? Not on file  ?Social History Narrative  ? Not on file  ? ?Social Determinants of Health  ? ?Financial Resource Strain: Not on file  ?Food Insecurity: Not on file  ?Transportation Needs: Not on file  ?Physical Activity: Not on file  ?Stress: Not on file  ?Social Connections: Not on file  ?  ? ?Family History: ?The patient's family history includes Diabetes in his brother, father, and mother; Heart attack in his brother; Heart disease in his brother; Kidney failure in his brother. There is no history of Thyroid disease. ? ?ROS:    ?Please see the history of present illness.    ? All other systems reviewed and are negative. ? ?EKGs/Labs/Other Studies Reviewed:   ? ?The following studies were reviewed today: ? ?Echo 03/2021 ?1. Left ventricular ejection fraction, by estimation, is 30%. The left  ?ventricle has severely decreased function. The left ventricle demonstrates  ?global hypokinesis. The left ventricular internal cavity size was mildly  ?dilated. There is mild left  ?ventricular hypertrophy. Left ventricular diastolic parameters are  ?indeterminate.  ? 2. Right ventricular systolic function is low normal. The right  ?ventricular size is normal.  ? 3. Left atrial size was severely dilated.  ? 4. Right atrial size was severely dilated.  ?  5. The mitral valve is normal in structure. Mild mitral valve  ?regurgitation.  ? 6. The aortic valve was not well visualized. Aortic valve regurgitation  ?is not visualized.  ? 7. Aortic dilatation noted. There is severe dilatation of the aortic root  ?and of the ascending aorta, measuring 55 mm.  ? ?Echo 2019 ?Study Conclusions  ? ?- Left ventricle: The cavity size was normal. There was mild  ?  concentric hypertrophy. Systolic function was moderately to  ?  severely reduced. The estimated ejection fraction was in the  ?  range of 30% to 35%. Diffuse hypokinesis. The study is not  ?  technically sufficient to allow evaluation of LV diastolic  ?  function.  ?- Mitral valve: There was mild regurgitation.  ?- Left atrium: The atrium was mildly dilated.  ?- Right ventricle: Systolic function was normal.  ?- Right atrium: The atrium was mildly dilated.  ?- Pulmonary arteries: Systolic pressure was mildly elevated. PA  ?  peak pressure: 40 mm Hg (S).  ? ?Impressions:  ? ?- Rhythm is atrial fibrillation.  ? ?Cardiac cath 2018 ?1. No significant coronary artery disease. ?2. Severely reduced LV systolic function by echo. Left ventricular angiography was not performed.  ?3. Right heart catheterization showed  minimally elevated filling pressures and pulmonary hypertension. Moderately reduced cardiac output. ?RA pressure: 12 mmHg, RV pressure: 40 over 6 mmHg, PA pressure 37/12 with a mean of 12 mmHg. Pulm

## 2021-10-02 NOTE — Patient Instructions (Signed)
Medication Instructions:  ?Your physician has recommended you make the following change in your medication:  ? ?-STOP Amlodipine ?-STOP Digoxin ? ?-START Jardiance 10 mg daily. An Rx has been sent to your pharmacy ? ?*If you need a refill on your cardiac medications before your next appointment, please call your pharmacy* ? ? ?Lab Work: ?Your physician recommends that you return for lab work (Bmp, Cbc) in: 2 weeks ? ?Please have your labs drawn at the [facility]. No appt needed. Lab hours are Mon-Fri 7am-6pm ? ? ?If you have labs (blood work) drawn today and your tests are completely normal, you will receive your results only by: ?MyChart Message (if you have MyChart) OR ?A paper copy in the mail ?If you have any lab test that is abnormal or we need to change your treatment, we will call you to review the results. ? ? ?Testing/Procedures: ?None ordered ? ? ?Follow-Up: ?At [facility], you and your health needs are our priority.  As part of our continuing mission to provide you with exceptional heart care, we have created designated Provider Care Teams.  These Care Teams include your primary Cardiologist (physician) and Advanced Practice Providers (APPs -  Physician Assistants and Nurse Practitioners) who all work together to provide you with the care you need, when you need it. ? ?We recommend signing up for the patient portal called "MyChart".  Sign up information is provided on this After Visit Summary.  MyChart is used to connect with patients for Virtual Visits (Telemedicine).  Patients are able to view lab/test results, encounter notes, upcoming appointments, etc.  Non-urgent messages can be sent to your provider as well.   ?To learn more about what you can do with MyChart, go to [email or web address].   ? ?Your next appointment:   ?3 month(s) ? ?The format for your next appointment:   ?In Person ? ?Provider:   ?You may see Lorine Bears, MD or one of the following Advanced Practice Providers on  your designated Care Team:   ?Nicolasa Ducking, NP ?Eula Listen, PA-C ?Cadence Fransico Michael, PA-C ? ? ?Other Instructions ?Please call to reschedule your appointment: ? ?[facility] ?[address] ?[place], [place] ?[phone] ? ?Important Information About [place] ? ? ? ? ? ? ?

## 2021-10-12 ENCOUNTER — Ambulatory Visit: Payer: Self-pay | Admitting: *Deleted

## 2021-10-12 ENCOUNTER — Ambulatory Visit: Payer: Medicare HMO | Admitting: Family Medicine

## 2021-10-12 NOTE — Telephone Encounter (Signed)
?  Chief Complaint: anxiety  ?Symptoms: woke up this am did not want to go to OV but OV was cancelled by practice. Toniann Fail POA reports patient does not want to talk to anyone due to argument with other daughter  ?Frequency: today  ?Pertinent Negatives: Patient denies harming self per POA ?Disposition: [] ED /[] Urgent Care (no appt availability in office) / [x] Appointment(In office/virtual)/ []  [facility]/ [] Home Care/ [] Refused Recommended Disposition /[] [facility]/ []  Follow-up with PCP ?Appt rescheduled for 10/15/21. Urgent crisis center [phone] given to POA in case patient noted with worsening anxiety .  ? ? ?Additional Notes:  ? ?Please advise if appt ok rescheduled for 10/15/21. ? ? Reason for Disposition ? Recent traumatic event (e.g., death of a loved one, job loss, victim/witness of crime) ? ?Answer Assessment - Initial Assessment Questions ?1. CONCERN: "Did anything happen that prompted you to call today?"  ?    Argument with "other daughter last night per daughter that is POA, ?2. ANXIETY SYMPTOMS: "Can you describe how you (your loved one; patient) have been feeling?" (e.g., tense, restless, panicky, anxious, keyed up, overwhelmed, sense of impending doom).  ?    reports patient woke up and reports he just doesn't want to go to appt today that had been canceled by practice  ?3. ONSET: "How long have you been feeling this way?" (e.g., hours, days, weeks) ?    This am  ?4. SEVERITY: "How would you rate the level of anxiety?" (e.g., 0 - 10; or mild, moderate, severe). ?    Na  ?5. FUNCTIONAL IMPAIRMENT: "How have these feelings affected your ability to do daily activities?" "Have you had more difficulty than usual doing your normal daily activities?" (e.g., getting better, same, worse; self-care, school, work, interactions) ?    na ?6. HISTORY: "Have you felt this way before?" "Have you ever been diagnosed with an anxiety problem in the past?" (e.g., generalized anxiety  disorder, panic attacks, PTSD). If Yes, ask: "How was this problem treated?" (e.g., medicines, counseling, etc.) ?    See hx  ?7. RISK OF HARM - SUICIDAL IDEATION: "Do you ever have thoughts of hurting or killing yourself?" If Yes, ask:  "Do you have these feelings now?" "Do you have a plan on how you would do this?" ?    Denies  ?8. TREATMENT:  "What has been done so far to treat this anxiety?" (e.g., medicines, relaxation strategies). "What has helped?" ?    na ?9. TREATMENT - THERAPIST: "Do you have a counselor or therapist? Name?" ?    na ?10. POTENTIAL TRIGGERS: "Do you drink caffeinated beverages (e.g., coffee, colas, teas), and how much daily?" "Do you drink alcohol or use any drugs?" "Have you started any new medicines recently?" ?    na ?10. PATIENT SUPPORT: "Who is with you now?" "Who do you live with?" "Do you have family or friends who you can talk to?"  ?      with patient now  ?11. OTHER SYMPTOMS: "Do you have any other symptoms?" (e.g., feeling depressed, trouble concentrating, trouble sleeping, trouble breathing, palpitations or fast heartbeat, chest pain, sweating, nausea, or diarrhea) ?      No  ?12. PREGNANCY: "Is there any chance you are pregnant?" "When was your last menstrual period?" ?      na ? ?Protocols used: Anxiety and Panic Attack-A-AH ? ?

## 2021-10-13 NOTE — Telephone Encounter (Signed)
Sounds like OK to wait. If needs to be seen sooner- please scheduled with [facility].  ?

## 2021-10-14 ENCOUNTER — Ambulatory Visit (INDEPENDENT_AMBULATORY_CARE_PROVIDER_SITE_OTHER): Payer: Medicare HMO | Admitting: Podiatry

## 2021-10-14 DIAGNOSIS — B351 Tinea unguium: Secondary | ICD-10-CM | POA: Diagnosis not present

## 2021-10-14 DIAGNOSIS — M79674 Pain in right toe(s): Secondary | ICD-10-CM

## 2021-10-14 DIAGNOSIS — E119 Type 2 diabetes mellitus without complications: Secondary | ICD-10-CM

## 2021-10-14 DIAGNOSIS — M79675 Pain in left toe(s): Secondary | ICD-10-CM | POA: Diagnosis not present

## 2021-10-14 NOTE — Progress Notes (Signed)
?  Subjective:  ?Patient ID: Eugene Henderson, male    DOB: Apr 23, 1951,  MRN: [id number] ? ?Chief Complaint  ?Patient presents with  ? Nail Problem  ?  Thick painful toenails, 3 month follow up overdue  ? ? ?71 y.o. male presents with the above complaint. History confirmed with patient.  Nails are thickened elongated and causing pain again ? ?Objective:  ?Physical Exam: ?warm, good capillary refill, no trophic changes or ulcerative lesions, normal DP and PT pulses, normal monofilament exam, and normal sensory exam.  Thickened elongated yellowed brown discolored toenails with subungual debris ?Assessment:  ? ?1. Pain due to onychomycosis of toenails of both feet   ?2. Diabetes mellitus without complication (HCC)   ? ? ? ?Plan:  ?Patient was evaluated and treated and all questions answered. ? ? ?Discussed the etiology and treatment options for the condition in detail with the patient. Educated patient on the topical and oral treatment options for mycotic nails. Recommended debridement of the nails today. Sharp and mechanical debridement performed of all painful and mycotic nails today. Nails debrided in length and thickness using a nail nipper to level of comfort. Discussed treatment options including appropriate shoe gear. Follow up as needed for painful nails. ? ? ? ?Return in about 3 months (around 01/14/2022) for at risk diabetic foot care.  ? ? ?

## 2021-10-15 ENCOUNTER — Ambulatory Visit: Payer: Medicare HMO | Admitting: Family Medicine

## 2021-10-24 ENCOUNTER — Other Ambulatory Visit: Payer: Self-pay | Admitting: Family Medicine

## 2021-10-26 NOTE — Telephone Encounter (Signed)
Requested Prescriptions  Pending Prescriptions Disp Refills  . albuterol (VENTOLIN HFA) 108 (90 Base) MCG/ACT inhaler [Pharmacy Med Name: ALBUTEROL HFA (PROVENTIL) INH] 6.7 each 3    Sig: TAKE 2 PUFFS BY MOUTH EVERY 6 HOURS AS NEEDED FOR WHEEZE OR SHORTNESS OF BREATH     Pulmonology:  Beta Agonists 2 Passed - 10/24/2021  9:12 AM      Passed - Last BP in normal range    BP Readings from Last 1 Encounters:  10/02/21 116/84         Passed - Last Heart Rate in normal range    Pulse Readings from Last 1 Encounters:  10/02/21 94         Passed - Valid encounter within last 12 months    Recent Outpatient Visits          4 months ago Routine general medical examination at a health care facility   [facility], Megan P, DO   11 months ago COVID-19   [facility], [place], DO   12 months ago Diabetes mellitus without complication [facility])   [facility] [place], Megan P, DO   1 year ago Diabetes mellitus without complication [facility])   [facility] [place], [place], DO   2 years ago Essential hypertension   [facility] [place], [place], DO      Future Appointments            In 2 months Furth, Cadence H, PA-C [facility], [facility]

## 2021-11-06 ENCOUNTER — Encounter: Payer: Medicare HMO | Admitting: Thoracic Surgery (Cardiothoracic Vascular Surgery)

## 2021-12-01 ENCOUNTER — Other Ambulatory Visit: Payer: Self-pay | Admitting: Family Medicine

## 2021-12-02 ENCOUNTER — Other Ambulatory Visit: Payer: Self-pay | Admitting: Family Medicine

## 2021-12-02 NOTE — Telephone Encounter (Signed)
Not on current med list. Requested Prescriptions  Pending Prescriptions Disp Refills  . amLODipine (NORVASC) 5 MG tablet [Pharmacy Med Name: AMLODIPINE BESYLATE 5 MG TAB] 90 tablet 0    Sig: TAKE 1 TABLET (5 MG TOTAL) BY MOUTH DAILY.     Cardiovascular: Calcium Channel Blockers 2 Passed - 12/02/2021  1:51 AM      Passed - Last BP in normal range    BP Readings from Last 1 Encounters:  10/02/21 116/84         Passed - Last Heart Rate in normal range    Pulse Readings from Last 1 Encounters:  10/02/21 94         Passed - Valid encounter within last 6 months    Recent Outpatient Visits          5 months ago Routine general medical examination at a health care facility   [facility] [place], [place], DO   1 year ago COVID-19   [facility] [place], [place], DO   1 year ago Diabetes mellitus without complication [facility])   [facility] [place], Megan P, DO   1 year ago Diabetes mellitus without complication [facility])   [facility] [place], [place], DO   2 years ago Essential hypertension   [facility] [place], [place], DO      Future Appointments            In 1 month Furth, Cadence H, PA-C [facility], [facility]

## 2021-12-14 ENCOUNTER — Other Ambulatory Visit: Payer: Self-pay | Admitting: Family Medicine

## 2021-12-15 NOTE — Telephone Encounter (Signed)
Requested Prescriptions  Pending Prescriptions Disp Refills  . metFORMIN (GLUCOPHAGE-XR) 500 MG 24 hr tablet [Pharmacy Med Name: METFORMIN HCL ER 500 MG TABLET] 180 tablet 0    Sig: Take 1 tablet (500 mg total) by mouth in the morning and at bedtime. OFFICE VISIT NEEDED FOR ADDITIONAL REFILLS     Endocrinology:  Diabetes - Biguanides Passed - 12/14/2021  6:10 PM      Passed - Cr in normal range and within 360 days    Creatinine, Ser  Date Value Ref Range Status  06/18/2021 0.77 0.76 - 1.27 mg/dL Final         Passed - HBA1C is between 0 and 7.9 and within 180 days    HB A1C (BAYER DCA - WAIVED)  Date Value Ref Range Status  06/18/2021 6.9 (H) 4.8 - 5.6 % Final    Comment:             Prediabetes: 5.7 - 6.4          Diabetes: >6.4          Glycemic control for adults with diabetes: <7.0          Passed - eGFR in normal range and within 360 days    GFR calc Af Amer  Date Value Ref Range Status  03/28/2020 96 >59 mL/min/1.73 Final    Comment:    **In accordance with recommendations from the NKF-ASN Task force,**   [facility] is in the process of updating its eGFR calculation to the   2021 CKD-EPI creatinine equation that estimates kidney function   without a race variable.    GFR calc non Af Amer  Date Value Ref Range Status  03/28/2020 83 >59 mL/min/1.73 Final   eGFR  Date Value Ref Range Status  06/18/2021 96 >59 mL/min/1.73 Final         Passed - B12 Level in normal range and within 720 days    Vitamin B-12  Date Value Ref Range Status  06/18/2021 304 232 - 1,245 pg/mL Final         Passed - Valid encounter within last 6 months    Recent Outpatient Visits          6 months ago Routine general medical examination at a health care facility   [facility], [place], DO   1 year ago COVID-19   [place], [place], DO   1 year ago Diabetes mellitus without complication [facility])   [place], [place] P, DO   1  year ago Diabetes mellitus without complication ([place])   [place], [place], DO   2 years ago Essential hypertension   [place], [place], DO      Future Appointments            In 2 weeks Furth, Cadence H, PA-C [place], [facility]           Passed - CBC within normal limits and completed in the last 12 months    WBC  Date Value Ref Range Status  06/18/2021 4.8 3.4 - 10.8 x10E3/uL Final  08/27/2019 4.8 4.0 - 10.5 K/uL Final   RBC  Date Value Ref Range Status  06/18/2021 4.88 4.14 - 5.80 x10E6/uL Final  08/27/2019 4.12 (L) 4.22 - 5.81 MIL/uL Final   Hemoglobin  Date Value Ref Range Status  06/18/2021 14.3 13.0 - 17.7 g/dL Final   Hematocrit  Date Value Ref Range  Status  06/18/2021 42.1 37.5 - 51.0 % Final   MCHC  Date Value Ref Range Status  06/18/2021 34.0 31.5 - 35.7 g/dL Final  08/27/2019 33.5 30.0 - 36.0 g/dL Final   [facility]  Date Value Ref Range Status  06/18/2021 29.3 26.6 - 33.0 pg Final  08/27/2019 30.8 26.0 - 34.0 pg Final   MCV  Date Value Ref Range Status  06/18/2021 86 79 - 97 fL Final   No results found for: "PLTCOUNTKUC", "LABPLAT", "POCPLA" RDW  Date Value Ref Range Status  06/18/2021 15.1 11.6 - 15.4 % Final         . irbesartan (AVAPRO) 150 MG tablet [Pharmacy Med Name: IRBESARTAN 150 MG TABLET] 90 tablet 0    Sig: Take 1 tablet (150 mg total) by mouth daily. OFFICE VISIT NEEDED FOR ADDITIONAL REFILLS     Cardiovascular:  Angiotensin Receptor Blockers Passed - 12/14/2021  6:10 PM      Passed - Cr in normal range and within 180 days    Creatinine, Ser  Date Value Ref Range Status  06/18/2021 0.77 0.76 - 1.27 mg/dL Final         Passed - K in normal range and within 180 days    Potassium  Date Value Ref Range Status  06/18/2021 4.2 3.5 - 5.2 mmol/L Final         Passed - Patient is not pregnant      Passed - Last BP in normal range    BP Readings from Last 1 Encounters:   10/02/21 116/84         Passed - Valid encounter within last 6 months    Recent Outpatient Visits          6 months ago Routine general medical examination at a health care facility   [place], [place], DO   1 year ago COVID-19   [place], [place], DO   1 year ago Diabetes mellitus without complication [facility])   [place], Megan P, DO   1 year ago Diabetes mellitus without complication [facility])   [place], [place], DO   2 years ago Essential hypertension   [place], [place], DO      Future Appointments            In 2 weeks Furth, Cadence H, PA-C [place], [facility]

## 2021-12-18 ENCOUNTER — Other Ambulatory Visit: Payer: Self-pay | Admitting: Family Medicine

## 2021-12-18 NOTE — Telephone Encounter (Signed)
Requested Prescriptions  Pending Prescriptions Disp Refills  . budesonide-formoterol (SYMBICORT) 160-4.5 MCG/ACT inhaler [Pharmacy Med Name: SYMBICORT 160-4.5 MCG INHALER] 30.6 each 1    Sig: TAKE 2 PUFFS BY MOUTH TWICE A DAY     Pulmonology:  Combination Products Passed - 12/18/2021  2:24 AM      Passed - Valid encounter within last 12 months    Recent Outpatient Visits          6 months ago Routine general medical examination at a health care facility   [facility], [facility], DO   1 year ago COVID-19   [facility] [place], [place] P, DO   1 year ago Diabetes mellitus without complication [facility])   [facility] [place], Megan P, DO   1 year ago Diabetes mellitus without complication [facility])   [facility] [place], [place], DO   2 years ago Essential hypertension   [facility] [place], [place], DO      Future Appointments            In 2 weeks Furth, Cadence H, PA-C [facility], [facility]

## 2021-12-22 ENCOUNTER — Encounter: Payer: Self-pay | Admitting: Family Medicine

## 2022-01-01 ENCOUNTER — Ambulatory Visit: Payer: Medicare HMO | Admitting: Medical

## 2022-01-03 ENCOUNTER — Other Ambulatory Visit: Payer: Self-pay | Admitting: Family Medicine

## 2022-01-04 ENCOUNTER — Encounter: Payer: Self-pay | Admitting: Medical

## 2022-01-05 NOTE — Telephone Encounter (Signed)
Requested medication (s) are due for refill today - no  Requested medication (s) are on the active medication list -no  Future visit scheduled -no  Last refill: 08/30/21  Notes to clinic: medication no longer on current medication list  Requested Prescriptions  Pending Prescriptions Disp Refills   amLODipine (NORVASC) 5 MG tablet [Pharmacy Med Name: AMLODIPINE BESYLATE 5 MG TAB] 90 tablet 0    Sig: TAKE 1 TABLET (5 MG TOTAL) BY MOUTH DAILY.     Cardiovascular: Calcium Channel Blockers 2 Failed - 01/03/2022  6:45 PM      Failed - Valid encounter within last 6 months    Recent Outpatient Visits           6 months ago Routine general medical examination at a health care facility   [facility], Megan P, DO   1 year ago COVID-19   [facility] [place], [place] P, DO   1 year ago Diabetes mellitus without complication [facility])   [facility] [place], Megan P, DO   1 year ago Diabetes mellitus without complication (HCC)   [facility] [place], [place], DO   2 years ago Essential hypertension   [facility] [place], Megan P, DO              Passed - Last BP in normal range    BP Readings from Last 1 Encounters:  10/02/21 116/84         Passed - Last Heart Rate in normal range    Pulse Readings from Last 1 Encounters:  10/02/21 94            Requested Prescriptions  Pending Prescriptions Disp Refills   amLODipine (NORVASC) 5 MG tablet [Pharmacy Med Name: AMLODIPINE BESYLATE 5 MG TAB] 90 tablet 0    Sig: TAKE 1 TABLET (5 MG TOTAL) BY MOUTH DAILY.     Cardiovascular: Calcium Channel Blockers 2 Failed - 01/03/2022  6:45 PM      Failed - Valid encounter within last 6 months    Recent Outpatient Visits           6 months ago Routine general medical examination at a health care facility   [facility], [facility], DO   1 year ago COVID-19   [facility] [place], [place] P, DO   1  year ago Diabetes mellitus without complication [facility])   [facility] [employer], Megan P, DO   1 year ago Diabetes mellitus without complication [facility])   [facility] [place], [place], DO   2 years ago Essential hypertension   [facility] [place], Megan P, DO              Passed - Last BP in normal range    BP Readings from Last 1 Encounters:  10/02/21 116/84         Passed - Last Heart Rate in normal range    Pulse Readings from Last 1 Encounters:  10/02/21 94

## 2022-01-14 ENCOUNTER — Ambulatory Visit: Payer: Medicare HMO | Admitting: Podiatry

## 2022-02-14 ENCOUNTER — Other Ambulatory Visit: Payer: Self-pay | Admitting: Family Medicine

## 2022-02-15 ENCOUNTER — Ambulatory Visit: Payer: Medicare HMO | Admitting: Podiatry

## 2022-02-16 NOTE — Telephone Encounter (Signed)
Requested Prescriptions  Pending Prescriptions Disp Refills  . spironolactone (ALDACTONE) 25 MG tablet [Pharmacy Med Name: SPIRONOLACTONE 25 MG TABLET] 30 tablet 0    Sig: TAKE 1 TABLET (25 MG TOTAL) BY MOUTH DAILY.     Cardiovascular: Diuretics - Aldosterone Antagonist Failed - 02/14/2022  5:55 PM      Failed - Cr in normal range and within 180 days    Creatinine, Ser  Date Value Ref Range Status  06/18/2021 0.77 0.76 - 1.27 mg/dL Final         Failed - K in normal range and within 180 days    Potassium  Date Value Ref Range Status  06/18/2021 4.2 3.5 - 5.2 mmol/L Final         Failed - Na in normal range and within 180 days    Sodium  Date Value Ref Range Status  06/18/2021 139 134 - 144 mmol/L Final         Failed - eGFR is 30 or above and within 180 days    GFR calc Af Amer  Date Value Ref Range Status  03/28/2020 96 >59 mL/min/1.73 Final    Comment:    **In accordance with recommendations from the NKF-ASN Task force,**   [facility] is in the process of updating its eGFR calculation to the   2021 CKD-EPI creatinine equation that estimates kidney function   without a race variable.    GFR calc non Af Amer  Date Value Ref Range Status  03/28/2020 83 >59 mL/min/1.73 Final   eGFR  Date Value Ref Range Status  06/18/2021 96 >59 mL/min/1.73 Final         Failed - Valid encounter within last 6 months    Recent Outpatient Visits          8 months ago Routine general medical examination at a health care facility   [facility], [place], DO   1 year ago COVID-19   [place], [place] P, DO   1 year ago Diabetes mellitus without complication [facility])   [place], Megan P, DO   1 year ago Diabetes mellitus without complication [facility])   [address], [place], DO   2 years ago Essential hypertension   [place], Megan P, DO             Passed - Last BP in normal range     BP Readings from Last 1 Encounters:  10/02/21 116/84

## 2022-03-01 ENCOUNTER — Other Ambulatory Visit: Payer: Self-pay | Admitting: Family Medicine

## 2022-03-01 NOTE — Telephone Encounter (Signed)
Requested Prescriptions  Pending Prescriptions Disp Refills  . KLOR-CON M20 20 MEQ tablet [Pharmacy Med Name: KLOR-CON M20 TABLET] 90 tablet 0    Sig: TAKE 1 TABLET BY [place] DAY     Endocrinology:  Minerals - Potassium Supplementation Passed - 03/01/2022  2:22 AM      Passed - K in normal range and within 360 days    Potassium  Date Value Ref Range Status  06/18/2021 4.2 3.5 - 5.2 mmol/L Final         Passed - Cr in normal range and within 360 days    Creatinine, Ser  Date Value Ref Range Status  06/18/2021 0.77 0.76 - 1.27 mg/dL Final         Passed - Valid encounter within last 12 months    Recent Outpatient Visits          8 months ago Routine general medical examination at a health care facility   [facility], [place], DO   1 year ago COVID-19   [place], [place], DO   1 year ago Diabetes mellitus without complication [facility])   [place], [place], DO   1 year ago Diabetes mellitus without complication ([place])   [place], Megan P, DO   2 years ago Essential hypertension   [facility] [place], Megan P, DO             . furosemide (LASIX) 40 MG tablet [Pharmacy Med Name: FUROSEMIDE 40 MG TABLET] 90 tablet 1    Sig: TAKE 1 TABLET BY MOUTH EVERY DAY     Cardiovascular:  Diuretics - Loop Failed - 03/01/2022  2:22 AM      Failed - K in normal range and within 180 days    Potassium  Date Value Ref Range Status  06/18/2021 4.2 3.5 - 5.2 mmol/L Final         Failed - Ca in normal range and within 180 days    Calcium  Date Value Ref Range Status  06/18/2021 8.8 8.6 - 10.2 mg/dL Final         Failed - Na in normal range and within 180 days    Sodium  Date Value Ref Range Status  06/18/2021 139 134 - 144 mmol/L Final         Failed - Cr in normal range and within 180 days    Creatinine, Ser  Date Value Ref Range Status  06/18/2021 0.77 0.76 - 1.27 mg/dL Final          Failed - Cl in normal range and within 180 days    Chloride  Date Value Ref Range Status  06/18/2021 101 96 - 106 mmol/L Final         Failed - Mg Level in normal range and within 180 days    Magnesium  Date Value Ref Range Status  07/16/2017 1.7 1.7 - 2.4 mg/dL Final    Comment:    Performed at [facility], [address] [place], [place] [address]         Failed - Valid encounter within last 6 months    Recent Outpatient Visits          8 months ago Routine general medical examination at a health care facility   [place], Barb [place], DO   1 year ago [place], [place], DO   1  year ago Diabetes mellitus without complication [facility])   [facility] [place], Megan P, DO   1 year ago Diabetes mellitus without complication [facility])   [facility] [place], [place], DO   2 years ago Essential hypertension   [facility] [place], Megan P, DO             Passed - Last BP in normal range    BP Readings from Last 1 Encounters:  10/02/21 116/84

## 2022-03-10 ENCOUNTER — Encounter: Payer: Self-pay | Admitting: Podiatry

## 2022-03-17 ENCOUNTER — Ambulatory Visit: Payer: Medicare HMO | Admitting: Podiatry

## 2022-04-02 ENCOUNTER — Other Ambulatory Visit: Payer: Self-pay | Admitting: Family Medicine

## 2022-04-02 NOTE — Telephone Encounter (Signed)
Called pt - [id number] for appt.  

## 2022-04-02 NOTE — Telephone Encounter (Signed)
Requested medications are due for refill today.  yes  Requested medications are on the active medications list.  yes  Last refill. 12/15/2021 #180 0 rf  Future visit scheduled.   no  Notes to clinic.  Pt was to return to clinic 3 months after last appt. Pt is more than 3 months overdue.    Requested Prescriptions  Pending Prescriptions Disp Refills   metFORMIN (GLUCOPHAGE-XR) 500 MG 24 hr tablet [Pharmacy Med Name: METFORMIN HCL ER 500 MG TABLET] 180 tablet 0    Sig: TAKE 1 TABLET BY MOUTH IN THE MORNING AND AT BEDTIME. OFFICE VISIT NEEDED FOR ADDITIONAL REFILLS     Endocrinology:  Diabetes - Biguanides Failed - 04/02/2022  1:27 AM      Failed - HBA1C is between 0 and 7.9 and within 180 days    HB A1C (BAYER DCA - WAIVED)  Date Value Ref Range Status  06/18/2021 6.9 (H) 4.8 - 5.6 % Final    Comment:             Prediabetes: 5.7 - 6.4          Diabetes: >6.4          Glycemic control for adults with diabetes: <7.0          Failed - Valid encounter within last 6 months    Recent Outpatient Visits           9 months ago Routine general medical examination at a health care facility   [facility], [place], DO   1 year ago COVID-19   [place], [place], DO   1 year ago Diabetes mellitus without complication [facility])   [place], [place] P, DO   2 years ago Diabetes mellitus without complication [facility])   [place], [place], DO   2 years ago Essential hypertension   [place], Megan P, DO              Passed - Cr in normal range and within 360 days    Creatinine, Ser  Date Value Ref Range Status  06/18/2021 0.77 0.76 - 1.27 mg/dL Final         Passed - eGFR in normal range and within 360 days    GFR calc Af Amer  Date Value Ref Range Status  03/28/2020 96 >59 mL/min/1.73 Final    Comment:    **In accordance with recommendations from the NKF-ASN Task force,**    [facility] is in the process of updating its eGFR calculation to the   2021 CKD-EPI creatinine equation that estimates kidney function   without a race variable.    GFR calc non Af Amer  Date Value Ref Range Status  03/28/2020 83 >59 mL/min/1.73 Final   eGFR  Date Value Ref Range Status  06/18/2021 96 >59 mL/min/1.73 Final         Passed - B12 Level in normal range and within 720 days    Vitamin B-12  Date Value Ref Range Status  06/18/2021 304 232 - 1,245 pg/mL Final         Passed - CBC within normal limits and completed in the last 12 months    WBC  Date Value Ref Range Status  06/18/2021 4.8 3.4 - 10.8 x10E3/uL Final  08/27/2019 4.8 4.0 - 10.5 K/uL Final   RBC  Date Value Ref Range Status  06/18/2021 4.88 4.14 - 5.80 x10E6/uL Final  08/27/2019  4.12 (L) 4.22 - 5.81 MIL/uL Final   Hemoglobin  Date Value Ref Range Status  06/18/2021 14.3 13.0 - 17.7 g/dL Final   Hematocrit  Date Value Ref Range Status  06/18/2021 42.1 37.5 - 51.0 % Final   MCHC  Date Value Ref Range Status  06/18/2021 34.0 31.5 - 35.7 g/dL Final  08/27/2019 33.5 30.0 - 36.0 g/dL Final   [facility]  Date Value Ref Range Status  06/18/2021 29.3 26.6 - 33.0 pg Final  08/27/2019 30.8 26.0 - 34.0 pg Final   MCV  Date Value Ref Range Status  06/18/2021 86 79 - 97 fL Final   No results found for: "PLTCOUNTKUC", "LABPLAT", "POCPLA" RDW  Date Value Ref Range Status  06/18/2021 15.1 11.6 - 15.4 % Final

## 2022-04-05 ENCOUNTER — Encounter (INDEPENDENT_AMBULATORY_CARE_PROVIDER_SITE_OTHER): Payer: Self-pay

## 2022-05-01 ENCOUNTER — Other Ambulatory Visit: Payer: Self-pay | Admitting: Medical

## 2022-05-24 ENCOUNTER — Emergency Department
Admission: EM | Admit: 2022-05-24 | Discharge: 2022-05-24 | Disposition: A | Discharge status: Home / Self Care | Payer: Medicare HMO | Attending: Emergency Medicine | Admitting: Emergency Medicine

## 2022-05-24 ENCOUNTER — Encounter: Payer: Self-pay | Admitting: Emergency Medicine

## 2022-05-24 ENCOUNTER — Other Ambulatory Visit: Payer: Self-pay

## 2022-05-24 DIAGNOSIS — R000 Tachycardia, unspecified: Secondary | ICD-10-CM | POA: Diagnosis not present

## 2022-05-24 DIAGNOSIS — I10 Essential (primary) hypertension: Secondary | ICD-10-CM | POA: Diagnosis not present

## 2022-05-24 DIAGNOSIS — H5711 Ocular pain, right eye: Secondary | ICD-10-CM | POA: Diagnosis not present

## 2022-05-24 MED ORDER — ONDANSETRON 4 MG PO TBDP
4.0000 mg | ORAL_TABLET | Freq: Once | ORAL | Status: AC
  Administered 2022-05-24: 4 mg via ORAL
  Filled 2022-05-24: qty 1

## 2022-05-24 MED ORDER — OXYCODONE-ACETAMINOPHEN 5-325 MG PO TABS
1.0000 | ORAL_TABLET | Freq: Once | ORAL | Status: AC
  Administered 2022-05-24: 1 via ORAL
  Filled 2022-05-24: qty 1

## 2022-05-24 MED ORDER — TETRACAINE HCL 0.5 % OP SOLN
1.0000 [drp] | Freq: Once | OPHTHALMIC | Status: AC
  Administered 2022-05-24: 1 [drp] via OPHTHALMIC
  Filled 2022-05-24: qty 4

## 2022-05-24 MED ORDER — MOXIFLOXACIN HCL 0.5 % OP SOLN: 1.0000 [drp] | OPHTHALMIC | 0 refills | Status: AC

## 2022-05-24 MED ORDER — HYPROMELLOSE (GONIOSCOPIC) 2.5 % OP SOLN: 1.0000 [drp] | Freq: Three times a day (TID) | OPHTHALMIC | 12 refills | Status: DC | PRN

## 2022-05-24 MED ORDER — ACETAMINOPHEN 325 MG PO TABS: 650.0000 mg | ORAL_TABLET | Freq: Once | ORAL | Status: DC

## 2022-05-24 MED ORDER — ERYTHROMYCIN 5 MG/GM OP OINT: 1.0000 | TOPICAL_OINTMENT | Freq: Every day | OPHTHALMIC | 0 refills | Status: DC

## 2022-05-24 NOTE — ED Triage Notes (Signed)
Pt sts that he has pain behind his right eye. 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

## 2022-05-24 NOTE — Discharge Instructions (Signed)
You can use a cool compress on your eye. Can take Tylenol for pain, 650 mg every 6 hours. Please call Dr. Marlana Latus office tomorrow morning.  He can see you as early as 7:30 AM. Please use 1 drop of Vigamox every 2 hours. Please use 1/2 inch of erythromycin and apply it to your lower eyelid at night before bed. You can use artificial tears 3-4 times a day in the right eye.

## 2022-05-24 NOTE — ED Provider Notes (Signed)
[facility] Provider Note  Patient Contact: 11:34 PM (approximate)   History   Eye Pain   HPI  Eugene Henderson is a 71 y.o. male presents to the emergency department with intense right eye pain.  Patient has a history of prior corneal ulcer with hypopyon in 2019.  Patient states that he was watching television around 6 AM when pain suddenly started.  He denies foreign body sensation but has had some increased tearing.  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  No fever or chills at home.      Physical Exam   Triage Vital Signs: ED Triage Vitals  Enc Vitals Group     BP 05/24/22 1528 (!) 153/99     Pulse Rate 05/24/22 1528 (!) 106     Resp 05/24/22 1528 19     Temp 05/24/22 1528 98.9 F (37.2 C)     Temp Source 05/24/22 1528 Oral     SpO2 05/24/22 1528 94 %     Weight 05/24/22 1529 275 lb (124.7 kg)     Height 05/24/22 1737 6' (1.829 m)     Head Circumference --      Peak Flow --      Pain Score 05/24/22 1529 7     Pain Loc --      Pain Edu? --      Excl. in GC? --     Most recent vital signs: Vitals:   05/24/22 1528  BP: (!) 153/99  Pulse: (!) 106  Resp: 19  Temp: 98.9 F (37.2 C)  SpO2: 94%     General: Alert and in no acute distress. Eyes:  PERRL. EOMI. extraocular eye muscles intact.  Intraocular pressure 22. Head: No acute traumatic findings ENT:      Nose: No congestion/rhinnorhea.      Mouth/Throat: Mucous membranes are moist.  Neck: No stridor. No cervical spine tenderness to palpation. Cardiovascular:  Good peripheral perfusion Respiratory: Normal respiratory effort without tachypnea or retractions. Lungs CTAB. Good air entry to the bases with no decreased or absent breath sounds. Skin:   No rash noted Other:   ED Results / Procedures / Treatments   Labs (all labs ordered are listed, but only abnormal results are displayed) Labs Reviewed  RPR  HIV ANTIBODY (ROUTINE TESTING W  REFLEX)     PROCEDURES:  Critical Care performed: No  Procedures   MEDICATIONS ORDERED IN ED: Medications  tetracaine (PONTOCAINE) 0.5 % ophthalmic solution 1 drop (1 drop Left Eye Given 05/24/22 1803)  oxyCODONE-acetaminophen (PERCOCET/ROXICET) 5-325 MG per tablet 1 tablet (1 tablet Oral Given 05/24/22 1847)  ondansetron (ZOFRAN-ODT) disintegrating tablet 4 mg (4 mg Oral Given 05/24/22 1848)     IMPRESSION / MDM / ASSESSMENT AND PLAN / ED COURSE  I reviewed the triage vital signs and the nursing notes.                              Assessment and plan Right eye pain 71 year old male presents to the emergency department with sudden onset right eye pain.  Patient was hypertensive and mildly tachycardic at triage but vital signs were otherwise reassuring.  On exam, patient had intraocular pressure 22.  Given patient's history of corneal ulcer and hypopyon, I reached out to ophthalmologist on-call, Dr. Rolley Sims.  Dr. Rolley Sims recommended Vigamox every 2 hours and topical erythromycin at night before bed as  well as intermittent use of saline eyedrops.  He stated that he would see patient in the office tomorrow morning and patient was given follow-up instructions.  Dr. Rolley Sims recommended over-the-counter analgesics for pain.  Patient was given a Percocet prior to discharge.  Return precautions were given to return with new or worsening symptoms.      FINAL CLINICAL IMPRESSION(S) / ED DIAGNOSES   Final diagnoses:  Pain of right eye     Rx / DC Orders   ED Discharge Orders          Ordered    moxifloxacin (VIGAMOX) 0.5 % ophthalmic solution  Every 2 hours        05/24/22 1812    erythromycin ophthalmic ointment  Daily at bedtime        05/24/22 1812    hydroxypropyl methylcellulose / hypromellose (ISOPTO TEARS / GONIOVISC) 2.5 % ophthalmic solution  3 times daily PRN        05/24/22 1813             Note:  This document was prepared using Dragon voice recognition  software and may include unintentional dictation errors.   Pia Mau [place], PA-C 05/24/22 2337    Shaune Pollack, MD 05/25/22 2308

## 2022-05-28 ENCOUNTER — Other Ambulatory Visit: Payer: Self-pay | Admitting: Family Medicine

## 2022-05-28 NOTE — Telephone Encounter (Signed)
Requested Prescriptions  Pending Prescriptions Disp Refills   KLOR-CON M20 20 MEQ tablet [Pharmacy Med Name: KLOR-CON M20 TABLET] 90 tablet 0    Sig: TAKE 1 TABLET BY MOUTH EVERY DAY     Endocrinology:  Minerals - Potassium Supplementation Passed - 05/28/2022  2:28 AM      Passed - K in normal range and within 360 days    Potassium  Date Value Ref Range Status  06/18/2021 4.2 3.5 - 5.2 mmol/L Final         Passed - Cr in normal range and within 360 days    Creatinine, Ser  Date Value Ref Range Status  06/18/2021 0.77 0.76 - 1.27 mg/dL Final         Passed - Valid encounter within last 12 months    Recent Outpatient Visits           11 months ago Routine general medical examination at a health care facility   [facility], [place], DO   1 year ago COVID-19   [facility] [place], [place], DO   1 year ago Diabetes mellitus without complication [facility])   [facility] [place], Megan P, DO   2 years ago Diabetes mellitus without complication (HCC)   [facility] [place], [place], DO   2 years ago Essential hypertension   [facility] [place], Megan P, DO               atorvastatin (LIPITOR) 40 MG tablet [Pharmacy Med Name: ATORVASTATIN 40 MG TABLET] 90 tablet 0    Sig: TAKE 1 TABLET BY MOUTH EVERY DAY     Cardiovascular:  Antilipid - Statins Failed - 05/28/2022  2:28 AM      Failed - Lipid Panel in normal range within the last 12 months    Cholesterol, Total  Date Value Ref Range Status  06/18/2021 110 100 - 199 mg/dL Final   LDL Chol Calc (NIH)  Date Value Ref Range Status  06/18/2021 33 0 - 99 mg/dL Final   HDL  Date Value Ref Range Status  06/18/2021 62 >39 mg/dL Final   Triglycerides  Date Value Ref Range Status  06/18/2021 70 0 - 149 mg/dL Final         Passed - Patient is not pregnant      Passed - Valid encounter within last 12 months    Recent Outpatient Visits           11  months ago Routine general medical examination at a health care facility   [facility] [place], [place], DO   1 year ago COVID-19   [facility] [place], [place], DO   1 year ago Diabetes mellitus without complication [facility])   [facility] [place], Megan P, DO   2 years ago Diabetes mellitus without complication [facility])   [facility] [place], [place], DO   2 years ago Essential hypertension   [facility] [place], [place], DO

## 2022-06-01 ENCOUNTER — Telehealth: Payer: Self-pay | Admitting: *Deleted

## 2022-06-01 NOTE — Telephone Encounter (Signed)
     Patient  visit on 05/24/2022  at [facility] was for eye pain  Have you been able to follow up with your primary care physician? Made an appt with eye Dr for follow up  The patient was able to obtain any needed medicine or equipment.  Are there diet recommendations that you are having difficulty following?  Patient expresses understanding of discharge instructions and education provided has no other needs at this time.   Eugene Mao Greenauer -[name], Population Health [phone] [address] [place] , [place] [address] Email : [email or web address]

## 2022-06-13 ENCOUNTER — Other Ambulatory Visit: Payer: Self-pay | Admitting: Family Medicine

## 2022-06-14 ENCOUNTER — Emergency Department: Payer: Medicare HMO

## 2022-06-14 ENCOUNTER — Inpatient Hospital Stay: Payer: Medicare HMO

## 2022-06-14 ENCOUNTER — Other Ambulatory Visit: Payer: Self-pay

## 2022-06-14 ENCOUNTER — Inpatient Hospital Stay
Admission: EM | Admit: 2022-06-14 | Discharge: 2022-06-26 | DRG: 268 | Disposition: A | Discharge status: Home / Self Care | Payer: Medicare HMO | Attending: Internal Medicine | Admitting: Internal Medicine

## 2022-06-14 DIAGNOSIS — Z79899 Other long term (current) drug therapy: Secondary | ICD-10-CM

## 2022-06-14 DIAGNOSIS — J449 Chronic obstructive pulmonary disease, unspecified: Secondary | ICD-10-CM | POA: Diagnosis present

## 2022-06-14 DIAGNOSIS — I4821 Permanent atrial fibrillation: Secondary | ICD-10-CM | POA: Diagnosis not present

## 2022-06-14 DIAGNOSIS — I4891 Unspecified atrial fibrillation: Secondary | ICD-10-CM

## 2022-06-14 DIAGNOSIS — R079 Chest pain, unspecified: Secondary | ICD-10-CM | POA: Diagnosis present

## 2022-06-14 DIAGNOSIS — I428 Other cardiomyopathies: Secondary | ICD-10-CM | POA: Diagnosis not present

## 2022-06-14 DIAGNOSIS — Z7901 Long term (current) use of anticoagulants: Secondary | ICD-10-CM | POA: Diagnosis not present

## 2022-06-14 DIAGNOSIS — E1151 Type 2 diabetes mellitus with diabetic peripheral angiopathy without gangrene: Secondary | ICD-10-CM | POA: Diagnosis present

## 2022-06-14 DIAGNOSIS — E785 Hyperlipidemia, unspecified: Secondary | ICD-10-CM | POA: Diagnosis present

## 2022-06-14 DIAGNOSIS — K2970 Gastritis, unspecified, without bleeding: Secondary | ICD-10-CM | POA: Diagnosis not present

## 2022-06-14 DIAGNOSIS — E1159 Type 2 diabetes mellitus with other circulatory complications: Secondary | ICD-10-CM

## 2022-06-14 DIAGNOSIS — M545 Low back pain, unspecified: Secondary | ICD-10-CM | POA: Diagnosis present

## 2022-06-14 DIAGNOSIS — I5020 Unspecified systolic (congestive) heart failure: Secondary | ICD-10-CM

## 2022-06-14 DIAGNOSIS — R1011 Right upper quadrant pain: Secondary | ICD-10-CM

## 2022-06-14 DIAGNOSIS — I110 Hypertensive heart disease with heart failure: Secondary | ICD-10-CM | POA: Diagnosis not present

## 2022-06-14 DIAGNOSIS — E876 Hypokalemia: Secondary | ICD-10-CM | POA: Diagnosis not present

## 2022-06-14 DIAGNOSIS — T447X6A Underdosing of beta-adrenoreceptor antagonists, initial encounter: Secondary | ICD-10-CM | POA: Diagnosis present

## 2022-06-14 DIAGNOSIS — I712 Thoracic aortic aneurysm, without rupture, unspecified: Secondary | ICD-10-CM | POA: Diagnosis not present

## 2022-06-14 DIAGNOSIS — I743 Embolism and thrombosis of arteries of the lower extremities: Secondary | ICD-10-CM | POA: Diagnosis present

## 2022-06-14 DIAGNOSIS — M47812 Spondylosis without myelopathy or radiculopathy, cervical region: Secondary | ICD-10-CM | POA: Diagnosis not present

## 2022-06-14 DIAGNOSIS — Z6837 Body mass index (BMI) 37.0-37.9, adult: Secondary | ICD-10-CM

## 2022-06-14 DIAGNOSIS — F1010 Alcohol abuse, uncomplicated: Secondary | ICD-10-CM | POA: Diagnosis present

## 2022-06-14 DIAGNOSIS — J9601 Acute respiratory failure with hypoxia: Secondary | ICD-10-CM | POA: Diagnosis not present

## 2022-06-14 DIAGNOSIS — K828 Other specified diseases of gallbladder: Secondary | ICD-10-CM | POA: Diagnosis not present

## 2022-06-14 DIAGNOSIS — Z1152 Encounter for screening for COVID-19: Secondary | ICD-10-CM

## 2022-06-14 DIAGNOSIS — Z833 Family history of diabetes mellitus: Secondary | ICD-10-CM

## 2022-06-14 DIAGNOSIS — R109 Unspecified abdominal pain: Secondary | ICD-10-CM | POA: Diagnosis not present

## 2022-06-14 DIAGNOSIS — D696 Thrombocytopenia, unspecified: Secondary | ICD-10-CM | POA: Diagnosis not present

## 2022-06-14 DIAGNOSIS — T501X6A Underdosing of loop [high-ceiling] diuretics, initial encounter: Secondary | ICD-10-CM | POA: Diagnosis present

## 2022-06-14 DIAGNOSIS — F05 Delirium due to known physiological condition: Secondary | ICD-10-CM | POA: Diagnosis not present

## 2022-06-14 DIAGNOSIS — K5730 Diverticulosis of large intestine without perforation or abscess without bleeding: Secondary | ICD-10-CM | POA: Diagnosis present

## 2022-06-14 DIAGNOSIS — I255 Ischemic cardiomyopathy: Secondary | ICD-10-CM | POA: Diagnosis present

## 2022-06-14 DIAGNOSIS — K760 Fatty (change of) liver, not elsewhere classified: Secondary | ICD-10-CM | POA: Diagnosis not present

## 2022-06-14 DIAGNOSIS — R935 Abnormal findings on diagnostic imaging of other abdominal regions, including retroperitoneum: Secondary | ICD-10-CM | POA: Diagnosis not present

## 2022-06-14 DIAGNOSIS — I719 Aortic aneurysm of unspecified site, without rupture: Secondary | ICD-10-CM | POA: Diagnosis not present

## 2022-06-14 DIAGNOSIS — Z7951 Long term (current) use of inhaled steroids: Secondary | ICD-10-CM

## 2022-06-14 DIAGNOSIS — I716 Thoracoabdominal aortic aneurysm, without rupture, unspecified: Principal | ICD-10-CM

## 2022-06-14 DIAGNOSIS — I4892 Unspecified atrial flutter: Secondary | ICD-10-CM | POA: Diagnosis not present

## 2022-06-14 DIAGNOSIS — I2510 Atherosclerotic heart disease of native coronary artery without angina pectoris: Secondary | ICD-10-CM | POA: Diagnosis present

## 2022-06-14 DIAGNOSIS — Z01810 Encounter for preprocedural cardiovascular examination: Secondary | ICD-10-CM

## 2022-06-14 DIAGNOSIS — I509 Heart failure, unspecified: Secondary | ICD-10-CM

## 2022-06-14 DIAGNOSIS — M25552 Pain in left hip: Secondary | ICD-10-CM | POA: Diagnosis not present

## 2022-06-14 DIAGNOSIS — Z8249 Family history of ischemic heart disease and other diseases of the circulatory system: Secondary | ICD-10-CM

## 2022-06-14 DIAGNOSIS — T465X6A Underdosing of other antihypertensive drugs, initial encounter: Secondary | ICD-10-CM | POA: Diagnosis present

## 2022-06-14 DIAGNOSIS — Z01818 Encounter for other preprocedural examination: Secondary | ICD-10-CM | POA: Diagnosis not present

## 2022-06-14 DIAGNOSIS — M1990 Unspecified osteoarthritis, unspecified site: Secondary | ICD-10-CM | POA: Diagnosis present

## 2022-06-14 DIAGNOSIS — J45909 Unspecified asthma, uncomplicated: Secondary | ICD-10-CM | POA: Diagnosis present

## 2022-06-14 DIAGNOSIS — E119 Type 2 diabetes mellitus without complications: Secondary | ICD-10-CM

## 2022-06-14 DIAGNOSIS — I5021 Acute systolic (congestive) heart failure: Secondary | ICD-10-CM | POA: Diagnosis present

## 2022-06-14 DIAGNOSIS — I4819 Other persistent atrial fibrillation: Secondary | ICD-10-CM | POA: Diagnosis not present

## 2022-06-14 DIAGNOSIS — F17210 Nicotine dependence, cigarettes, uncomplicated: Secondary | ICD-10-CM | POA: Diagnosis not present

## 2022-06-14 DIAGNOSIS — R0602 Shortness of breath: Secondary | ICD-10-CM | POA: Diagnosis not present

## 2022-06-14 DIAGNOSIS — I723 Aneurysm of iliac artery: Secondary | ICD-10-CM | POA: Diagnosis not present

## 2022-06-14 DIAGNOSIS — I480 Paroxysmal atrial fibrillation: Secondary | ICD-10-CM | POA: Diagnosis present

## 2022-06-14 DIAGNOSIS — Z189 Retained foreign body fragments, unspecified material: Secondary | ICD-10-CM

## 2022-06-14 DIAGNOSIS — I10 Essential (primary) hypertension: Secondary | ICD-10-CM | POA: Diagnosis present

## 2022-06-14 DIAGNOSIS — I4811 Longstanding persistent atrial fibrillation: Secondary | ICD-10-CM | POA: Diagnosis not present

## 2022-06-14 DIAGNOSIS — I5022 Chronic systolic (congestive) heart failure: Secondary | ICD-10-CM | POA: Diagnosis not present

## 2022-06-14 DIAGNOSIS — Z86010 Personal history of colonic polyps: Secondary | ICD-10-CM

## 2022-06-14 DIAGNOSIS — Z136 Encounter for screening for cardiovascular disorders: Secondary | ICD-10-CM | POA: Diagnosis not present

## 2022-06-14 DIAGNOSIS — I5023 Acute on chronic systolic (congestive) heart failure: Secondary | ICD-10-CM | POA: Diagnosis not present

## 2022-06-14 DIAGNOSIS — G8929 Other chronic pain: Secondary | ICD-10-CM | POA: Diagnosis present

## 2022-06-14 DIAGNOSIS — T500X6A Underdosing of mineralocorticoids and their antagonists, initial encounter: Secondary | ICD-10-CM | POA: Diagnosis present

## 2022-06-14 DIAGNOSIS — R0789 Other chest pain: Secondary | ICD-10-CM | POA: Diagnosis not present

## 2022-06-14 DIAGNOSIS — I340 Nonrheumatic mitral (valve) insufficiency: Secondary | ICD-10-CM | POA: Diagnosis not present

## 2022-06-14 DIAGNOSIS — R0603 Acute respiratory distress: Secondary | ICD-10-CM | POA: Diagnosis present

## 2022-06-14 DIAGNOSIS — K761 Chronic passive congestion of liver: Secondary | ICD-10-CM | POA: Diagnosis present

## 2022-06-14 DIAGNOSIS — I513 Intracardiac thrombosis, not elsewhere classified: Secondary | ICD-10-CM | POA: Diagnosis present

## 2022-06-14 DIAGNOSIS — Z720 Tobacco use: Secondary | ICD-10-CM | POA: Diagnosis not present

## 2022-06-14 DIAGNOSIS — R1084 Generalized abdominal pain: Secondary | ICD-10-CM | POA: Diagnosis not present

## 2022-06-14 DIAGNOSIS — S0095XA Superficial foreign body of unspecified part of head, initial encounter: Secondary | ICD-10-CM | POA: Insufficient documentation

## 2022-06-14 DIAGNOSIS — Z91148 Patient's other noncompliance with medication regimen for other reason: Secondary | ICD-10-CM

## 2022-06-14 DIAGNOSIS — Z841 Family history of disorders of kidney and ureter: Secondary | ICD-10-CM

## 2022-06-14 DIAGNOSIS — H5461 Unqualified visual loss, right eye, normal vision left eye: Secondary | ICD-10-CM | POA: Diagnosis present

## 2022-06-14 DIAGNOSIS — I959 Hypotension, unspecified: Secondary | ICD-10-CM | POA: Diagnosis not present

## 2022-06-14 DIAGNOSIS — Z716 Tobacco abuse counseling: Secondary | ICD-10-CM

## 2022-06-14 DIAGNOSIS — K5900 Constipation, unspecified: Secondary | ICD-10-CM | POA: Diagnosis not present

## 2022-06-14 DIAGNOSIS — K219 Gastro-esophageal reflux disease without esophagitis: Secondary | ICD-10-CM | POA: Diagnosis present

## 2022-06-14 DIAGNOSIS — R142 Eructation: Secondary | ICD-10-CM | POA: Diagnosis not present

## 2022-06-14 DIAGNOSIS — R17 Unspecified jaundice: Secondary | ICD-10-CM | POA: Insufficient documentation

## 2022-06-14 DIAGNOSIS — Z7984 Long term (current) use of oral hypoglycemic drugs: Secondary | ICD-10-CM

## 2022-06-14 DIAGNOSIS — I714 Abdominal aortic aneurysm, without rupture, unspecified: Secondary | ICD-10-CM | POA: Diagnosis not present

## 2022-06-14 LAB — RESP PANEL BY RT-PCR (RSV, FLU A&B, COVID)  RVPGX2
Influenza A by PCR: NEGATIVE
Influenza B by PCR: NEGATIVE
Resp Syncytial Virus by PCR: NEGATIVE
SARS Coronavirus 2 by RT PCR: NEGATIVE

## 2022-06-14 LAB — HEPATIC FUNCTION PANEL
ALT: 20 U/L (ref 0–44)
AST: 23 U/L (ref 15–41)
Albumin: 4 g/dL (ref 3.5–5.0)
Alkaline Phosphatase: 64 U/L (ref 38–126)
Bilirubin, Direct: 0.4 mg/dL — ABNORMAL HIGH (ref 0.0–0.2)
Indirect Bilirubin: 1 mg/dL — ABNORMAL HIGH (ref 0.3–0.9)
Total Bilirubin: 1.4 mg/dL — ABNORMAL HIGH (ref 0.3–1.2)
Total Protein: 7.5 g/dL (ref 6.5–8.1)

## 2022-06-14 LAB — MAGNESIUM: Magnesium: 2 mg/dL (ref 1.7–2.4)

## 2022-06-14 LAB — PROTIME-INR
INR: 1 (ref 0.8–1.2)
Prothrombin Time: 13.4 seconds (ref 11.4–15.2)

## 2022-06-14 LAB — CBC
HCT: 42.3 % (ref 39.0–52.0)
Hemoglobin: 13.7 g/dL (ref 13.0–17.0)
MCH: 29 pg (ref 26.0–34.0)
MCHC: 32.4 g/dL (ref 30.0–36.0)
MCV: 89.6 fL (ref 80.0–100.0)
Platelets: 159 10*3/uL (ref 150–400)
RBC: 4.72 MIL/uL (ref 4.22–5.81)
RDW: 14.8 % (ref 11.5–15.5)
WBC: 5.4 10*3/uL (ref 4.0–10.5)
nRBC: 0 % (ref 0.0–0.2)

## 2022-06-14 LAB — LACTIC ACID, PLASMA: Lactic Acid, Venous: 1.7 mmol/L (ref 0.5–1.9)

## 2022-06-14 LAB — TROPONIN I (HIGH SENSITIVITY)
Troponin I (High Sensitivity): 25 ng/L — ABNORMAL HIGH (ref <18)
Troponin I (High Sensitivity): 28 ng/L — ABNORMAL HIGH (ref <18)

## 2022-06-14 LAB — PHOSPHORUS: Phosphorus: 4.2 mg/dL (ref 2.5–4.6)

## 2022-06-14 LAB — BASIC METABOLIC PANEL
Anion gap: 11 (ref 5–15)
BUN: 14 mg/dL (ref 8–23)
CO2: 21 mmol/L — ABNORMAL LOW (ref 22–32)
Calcium: 9.1 mg/dL (ref 8.9–10.3)
Chloride: 101 mmol/L (ref 98–111)
Creatinine, Ser: 0.76 mg/dL (ref 0.61–1.24)
GFR, Estimated: >60 mL/min (ref >60)
Glucose, Bld: 113 mg/dL — ABNORMAL HIGH (ref 70–99)
Potassium: 3.7 mmol/L (ref 3.5–5.1)
Sodium: 133 mmol/L — ABNORMAL LOW (ref 135–145)

## 2022-06-14 LAB — BRAIN NATRIURETIC PEPTIDE: B Natriuretic Peptide: 575.5 pg/mL — ABNORMAL HIGH (ref 0.0–100.0)

## 2022-06-14 MED ORDER — METOPROLOL SUCCINATE ER 100 MG PO TB24
100.0000 mg | ORAL_TABLET | Freq: Every day | ORAL | Status: DC
  Administered 2022-06-15: 100 mg via ORAL
  Filled 2022-06-14: qty 1

## 2022-06-14 MED ORDER — IRBESARTAN 150 MG PO TABS
150.0000 mg | ORAL_TABLET | Freq: Every day | ORAL | Status: DC
  Administered 2022-06-15 – 2022-06-19 (×5): 150 mg via ORAL
  Filled 2022-06-14 (×5): qty 1

## 2022-06-14 MED ORDER — METOPROLOL TARTRATE 5 MG/5ML IV SOLN
5.0000 mg | Freq: Once | INTRAVENOUS | Status: AC
  Administered 2022-06-14: 5 mg via INTRAVENOUS
  Filled 2022-06-14: qty 5

## 2022-06-14 MED ORDER — HYDROMORPHONE HCL 1 MG/ML IJ SOLN
0.5000 mg | Freq: Once | INTRAMUSCULAR | Status: AC
  Administered 2022-06-14: 0.5 mg via INTRAVENOUS
  Filled 2022-06-14: qty 0.5

## 2022-06-14 MED ORDER — HEPARIN SODIUM (PORCINE) 5000 UNIT/ML IJ SOLN
5000.0000 [IU] | Freq: Three times a day (TID) | INTRAMUSCULAR | Status: DC
  Administered 2022-06-14 – 2022-06-15 (×3): 5000 [IU] via SUBCUTANEOUS
  Filled 2022-06-14 (×3): qty 1

## 2022-06-14 MED ORDER — SPIRONOLACTONE 25 MG PO TABS
25.0000 mg | ORAL_TABLET | Freq: Every day | ORAL | Status: DC
  Administered 2022-06-15 – 2022-06-26 (×12): 25 mg via ORAL
  Filled 2022-06-14 (×12): qty 1

## 2022-06-14 MED ORDER — SENNOSIDES-DOCUSATE SODIUM 8.6-50 MG PO TABS: 1.0000 | ORAL_TABLET | Freq: Every evening | ORAL | Status: DC | PRN

## 2022-06-14 MED ORDER — MOMETASONE FURO-FORMOTEROL FUM 200-5 MCG/ACT IN AERO
2.0000 | INHALATION_SPRAY | Freq: Two times a day (BID) | RESPIRATORY_TRACT | Status: DC
  Administered 2022-06-15 – 2022-06-26 (×22): 2 via RESPIRATORY_TRACT
  Filled 2022-06-14 (×2): qty 8.8

## 2022-06-14 MED ORDER — ASPIRIN 81 MG PO CHEW
324.0000 mg | CHEWABLE_TABLET | Freq: Once | ORAL | Status: AC
  Administered 2022-06-14: 324 mg via ORAL
  Filled 2022-06-14: qty 4

## 2022-06-14 MED ORDER — ALBUTEROL SULFATE HFA 108 (90 BASE) MCG/ACT IN AERS: 2.0000 | INHALATION_SPRAY | RESPIRATORY_TRACT | Status: DC | PRN

## 2022-06-14 MED ORDER — NITROGLYCERIN 0.4 MG SL SUBL
0.4000 mg | SUBLINGUAL_TABLET | SUBLINGUAL | Status: DC | PRN
  Administered 2022-06-14: 0.4 mg via SUBLINGUAL
  Filled 2022-06-14: qty 1

## 2022-06-14 MED ORDER — ACETAMINOPHEN 650 MG RE SUPP: 650.0000 mg | Freq: Four times a day (QID) | RECTAL | Status: DC | PRN

## 2022-06-14 MED ORDER — ONDANSETRON HCL 4 MG/2ML IJ SOLN
4.0000 mg | Freq: Four times a day (QID) | INTRAMUSCULAR | Status: AC | PRN
  Administered 2022-06-15: 4 mg via INTRAVENOUS
  Filled 2022-06-14: qty 2

## 2022-06-14 MED ORDER — NICOTINE 21 MG/24HR TD PT24: 21.0000 mg | MEDICATED_PATCH | Freq: Every day | TRANSDERMAL | Status: DC | PRN

## 2022-06-14 MED ORDER — THIAMINE HCL 100 MG PO TABS
100.0000 mg | ORAL_TABLET | Freq: Every day | ORAL | Status: DC
  Administered 2022-06-15 – 2022-06-26 (×12): 100 mg via ORAL
  Filled 2022-06-14 (×24): qty 1

## 2022-06-14 MED ORDER — ALBUTEROL SULFATE (2.5 MG/3ML) 0.083% IN NEBU
2.5000 mg | INHALATION_SOLUTION | RESPIRATORY_TRACT | Status: DC | PRN
  Administered 2022-06-15: 2.5 mg via RESPIRATORY_TRACT
  Filled 2022-06-14: qty 3

## 2022-06-14 MED ORDER — ALUM & MAG HYDROXIDE-SIMETH 200-200-20 MG/5ML PO SUSP
30.0000 mL | Freq: Once | ORAL | Status: AC
  Administered 2022-06-14: 30 mL via ORAL
  Filled 2022-06-14: qty 30

## 2022-06-14 MED ORDER — LORAZEPAM 2 MG/ML IJ SOLN
2.0000 mg | INTRAMUSCULAR | Status: DC | PRN
  Administered 2022-06-15: 2 mg via INTRAVENOUS
  Filled 2022-06-14: qty 1

## 2022-06-14 MED ORDER — ACETAMINOPHEN 325 MG PO TABS
650.0000 mg | ORAL_TABLET | Freq: Four times a day (QID) | ORAL | Status: DC | PRN
  Administered 2022-06-14 – 2022-06-16 (×6): 650 mg via ORAL
  Filled 2022-06-14 (×6): qty 2

## 2022-06-14 MED ORDER — HYDROMORPHONE HCL 1 MG/ML IJ SOLN
0.5000 mg | INTRAMUSCULAR | Status: AC | PRN
  Administered 2022-06-14 – 2022-06-15 (×3): 0.5 mg via INTRAVENOUS
  Filled 2022-06-14 (×3): qty 0.5

## 2022-06-14 MED ORDER — ONDANSETRON HCL 4 MG PO TABS: 4.0000 mg | ORAL_TABLET | Freq: Four times a day (QID) | ORAL | Status: AC | PRN

## 2022-06-14 MED ORDER — ONDANSETRON HCL 4 MG/2ML IJ SOLN: 4.0000 mg | Freq: Once | INTRAMUSCULAR | Status: DC

## 2022-06-14 MED ORDER — MORPHINE SULFATE (PF) 4 MG/ML IV SOLN
4.0000 mg | INTRAVENOUS | Status: AC | PRN
  Administered 2022-06-14 – 2022-06-15 (×4): 4 mg via INTRAVENOUS
  Filled 2022-06-14 (×4): qty 1

## 2022-06-14 MED ORDER — SODIUM CHLORIDE 0.9 % IV SOLN
2.0000 g | INTRAVENOUS | Status: DC
  Administered 2022-06-14 – 2022-06-15 (×2): 2 g via INTRAVENOUS
  Filled 2022-06-14 (×2): qty 20

## 2022-06-14 MED ORDER — FUROSEMIDE 10 MG/ML IJ SOLN
40.0000 mg | Freq: Once | INTRAMUSCULAR | Status: AC
  Administered 2022-06-14: 40 mg via INTRAVENOUS
  Filled 2022-06-14: qty 4

## 2022-06-14 MED ORDER — METRONIDAZOLE 500 MG/100ML IV SOLN
500.0000 mg | Freq: Two times a day (BID) | INTRAVENOUS | Status: DC
  Administered 2022-06-14 – 2022-06-16 (×4): 500 mg via INTRAVENOUS
  Filled 2022-06-14 (×4): qty 100

## 2022-06-14 MED ORDER — HYDROMORPHONE HCL 1 MG/ML IJ SOLN: 0.5000 mg | Freq: Once | INTRAMUSCULAR | Status: DC

## 2022-06-14 MED ORDER — IOHEXOL 350 MG/ML SOLN
100.0000 mL | Freq: Once | INTRAVENOUS | Status: AC | PRN
  Administered 2022-06-14: 100 mL via INTRAVENOUS

## 2022-06-14 MED ORDER — HYDROMORPHONE HCL 1 MG/ML IJ SOLN: 1.0000 mg | INTRAMUSCULAR | Status: DC | PRN

## 2022-06-14 NOTE — Assessment & Plan Note (Signed)
Suspect secondary to cholecystitis - Morphine 4 mg IV every 4 hours as needed for moderate pain, 4 doses ordered; Dilaudid 0.5 mg IV every 4 hours as needed for severe pain, 3 doses ordered

## 2022-06-14 NOTE — Assessment & Plan Note (Signed)
-   This meets criteria for morbid obesity based on the presence of 1 or more chronic comorbidities. Patient has hypertension. This complicates overall care and prognosis.  

## 2022-06-14 NOTE — ED Notes (Signed)
Return of chest pain with lots of belching - also reports some left sided abdominal pain - notified dr. Starleen Blue

## 2022-06-14 NOTE — Assessment & Plan Note (Signed)
-   MRCP not ordered on admission due to multiple metallic shrapnel remnants - AM team to call radiology to determine if MRCP would be safe

## 2022-06-14 NOTE — Assessment & Plan Note (Signed)
-   Resumed home metoprolol succinate 100 mg daily 

## 2022-06-14 NOTE — Telephone Encounter (Signed)
Courtesy refill given, appointment needed.  Requested Prescriptions  Pending Prescriptions Disp Refills   irbesartan (AVAPRO) 150 MG tablet [Pharmacy Med Name: IRBESARTAN 150 MG TABLET] 30 tablet 0    Sig: TAKE 1 TABLET (150 MG TOTAL) BY MOUTH DAILY. OFFICE VISIT NEEDED FOR ADDITIONAL REFILLS     Cardiovascular:  Angiotensin Receptor Blockers Failed - 06/13/2022  4:35 PM      Failed - Cr in normal range and within 180 days    Creatinine, Ser  Date Value Ref Range Status  06/14/2022 0.76 0.61 - 1.24 mg/dL Final         Failed - K in normal range and within 180 days    Potassium  Date Value Ref Range Status  06/14/2022 3.7 3.5 - 5.1 mmol/L Final         Failed - Last BP in normal range    BP Readings from Last 1 Encounters:  06/14/22 110/87         Failed - Valid encounter within last 6 months    Recent Outpatient Visits           12 months ago Routine general medical examination at a health care facility   [facility], Barb [place]ino, DO   1 year ago COVID-19   [place], [place], DO   1 year ago Diabetes mellitus without complication [facility])   [place], Megan P, DO   2 years ago Diabetes mellitus without complication [facility]   [place], [place], DO   2 years ago Essential hypertension   [place], [place], DO       Future Appointments             In 2 weeks Kang, Megan P, DO [place], [place] - Patient is not pregnant       metFORMIN (GLUCOPHAGE-XR) 500 MG 24 hr tablet [Pharmacy Med Name: METFORMIN HCL ER 500 MG TABLET] 60 tablet 0    Sig: TAKE 1 TABLET BY MOUTH IN THE MORNING AND AT BEDTIME. OFFICE VISIT NEEDED FOR ADDITIONAL REFILLS     Endocrinology:  Diabetes - Biguanides Failed - 06/13/2022  4:35 PM      Failed - HBA1C is between 0 and 7.9 and within 180 days    HB A1C (BAYER DCA - WAIVED)  Date Value Ref Range Status   06/18/2021 6.9 (H) 4.8 - 5.6 % Final    Comment:             Prediabetes: 5.7 - 6.4          Diabetes: >6.4          Glycemic control for adults with diabetes: <7.0          Failed - Valid encounter within last 6 months    Recent Outpatient Visits           12 months ago Routine general medical examination at a health care facility   [place], [place], DO   1 year ago COVID-19   [place], [place], DO   1 year ago Diabetes mellitus without complication [facility])   [place], Megan P, DO   2 years ago Diabetes mellitus without complication [facility])   [place], [place], DO   2 years ago Essential hypertension   [place]  Dorcas [place], DO       Future Appointments             In 2 weeks [place], Megan P, DO [facility], [facility]            Failed - CBC within normal limits and completed in the last 12 months    WBC  Date Value Ref Range Status  06/14/2022 5.4 4.0 - 10.5 K/uL Final   RBC  Date Value Ref Range Status  06/14/2022 4.72 4.22 - 5.81 MIL/uL Final   Hemoglobin  Date Value Ref Range Status  06/14/2022 13.7 13.0 - 17.0 g/dL Final  63/78/5885 02.7 13.0 - 17.7 g/dL Final   HCT  Date Value Ref Range Status  06/14/2022 42.3 39.0 - 52.0 % Final   Hematocrit  Date Value Ref Range Status  06/18/2021 42.1 37.5 - 51.0 % Final   MCHC  Date Value Ref Range Status  06/14/2022 32.4 30.0 - 36.0 g/dL Final   [facility]  Date Value Ref Range Status  06/14/2022 29.0 26.0 - 34.0 pg Final   MCV  Date Value Ref Range Status  06/14/2022 89.6 80.0 - 100.0 fL Final  06/18/2021 86 79 - 97 fL Final   No results found for: "PLTCOUNTKUC", "LABPLAT", "POCPLA" RDW  Date Value Ref Range Status  06/14/2022 14.8 11.5 - 15.5 % Final  06/18/2021 15.1 11.6 - 15.4 % Final         Passed - Cr in normal range and within 360 days    Creatinine, Ser  Date Value Ref  Range Status  06/14/2022 0.76 0.61 - 1.24 mg/dL Final         Passed - eGFR in normal range and within 360 days    GFR calc Af Amer  Date Value Ref Range Status  03/28/2020 96 >59 mL/min/1.73 Final    Comment:    **In accordance with recommendations from the NKF-ASN Task force,**   [facility] is in the process of updating its eGFR calculation to the   2021 CKD-EPI creatinine equation that estimates kidney function   without a race variable.    GFR, Estimated  Date Value Ref Range Status  06/14/2022 >60 >60 mL/min Final    Comment:    (NOTE) Calculated using the CKD-EPI Creatinine Equation (2021)    eGFR  Date Value Ref Range Status  06/18/2021 96 >59 mL/min/1.73 Final         Passed - B12 Level in normal range and within 720 days    Vitamin B-12  Date Value Ref Range Status  06/18/2021 304 232 - 1,245 pg/mL Final

## 2022-06-14 NOTE — ED Notes (Signed)
Pt calling out for severe chest pain notified Dr. Starleen Blue - provider states to give nitro x 1

## 2022-06-14 NOTE — ED Provider Notes (Addendum)
Care assumed of patient from outgoing provider.  See their note for initial history, exam and plan.  Clinical Course as of 06/14/22 1618  Mon Jun 14, 2022  1536 History of HFrEF (30%), asthma, chest pain and dyspnea. Afib w/ RVR in the 150s, questionable compliance with home medications. Pain is pleuritic and epigastric.  Trop 28. Metop 5 x 2 and given nitro. Dissection study now.  Repeat trop. []  f/u CTA and admit. Rate control.  [SM]    Clinical Course User Index [SM] Nathaniel Man, MD   Notified by nursing staff patient patient continues to call out in pain and have severe chest pain.  Serial troponin was negative.  CTA independently reviewed with no signs of acute dissection.  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  Did note gallbladder distention and haziness concerning for possible acute cholecystitis however no identifiable gallstones noted.  No identifiable common bile duct dilation.  Patient given IV Dilaudid.  Ordered right upper quadrant ultrasound to evaluate for acute cholecystitis.  Right upper quadrant ultrasound was abnormal with material in the gallbladder but no obvious signs of acute cholecystitis.  Added on LFTs to evaluate T. bili.  Heart rate improved after pain medication with IV Dilaudid.  Ultrasound imaging with recommendations for possible MRCP to further evaluate.  Consulted hospitalist for further evaluation.   Nathaniel Man, MD 06/14/22 1621    Nathaniel Man, MD 06/14/22 1815

## 2022-06-14 NOTE — ED Notes (Signed)
Pt reports improvement in chest discomfort after nitro SL

## 2022-06-14 NOTE — ED Triage Notes (Signed)
Pt to ED for centralized chest pain and shob that started last night. Denies n/v

## 2022-06-14 NOTE — Assessment & Plan Note (Signed)
With abdominal pain - Concerning for cholecystitis versus CBD - Ceftriaxone and metronidazole ordered - X-ray of cervical neck has been ordered due to patient endorsing metal embedded in his skin - LFTs in a.m. - AM team to consult GI/general surgery as appropriate

## 2022-06-14 NOTE — ED Notes (Signed)
Significant improvement in pain after IV pain meds.   [place] at bedside.

## 2022-06-14 NOTE — H&P (Signed)
History and Physical   Eugene Henderson WNU:[id number] DOB: 06/26/1950 DOA: 06/14/2022  PCP: Dorcas Carrow, DO  Patient coming from: home  I have personally briefly reviewed patient's old medical records in [facility] EMR.  Chief Concern: chest pain  HPI: [name] is a 72 year old male with hypertension, hyperlipidemia, non-insulin-dependent diabetes mellitus, who presents emergency department for chief concerns of chest pain and shortness of breath.  Initial vitals in the emergency department showed temperature of 97.9, respiration rate of 20, heart rate of 126, blood pressure 145/96, SpO2 of 97% on room air.  Serum sodium is 133, potassium 3.7, chloride 101, bicarb 21, BUN of 14, serum creatinine of 0.76, EGFR greater than 60, nonfasting blood glucose 113, WBC 5.4, hemoglobin 13.7, platelets of 159.  BNP was elevated at 575.5.  High sensitive troponin was 25 and on repeat was 28.  COVID/influenza A/influenza B/RSV PCR were negative.  ED treatment: Maalox PO, aspirin 324 mg p.o. one-time dose, furosemide 40 mg IV, Dilaudid 0.5 mg IV, metoprolol 5 mg IV x 2, sublingual nitroglycerin 0.4 mg one-time dose ordered. ------------------------- At bedside, he is able to tell me his name, age, current calendar year of 2024.   He reports his chest started hurting two days ago. He denies known trauma to his person.  He reports that about one month prior he and a friend were playfully wrestling on the floor. He denies known trauma at the time.  He endorses subjective fever and chills at home. He felt the pain in his chest, in his lower abdomen. He denies dysuria, hematuria. He reports the pain improved with urination therefore he did not present earlier.   He endorses diarrhea. He denies pain with or after eating.  Social history: He lives with his daughter. He smokes about 10 rolls of tobacco per day. He drinks 12 cans of beer per week. He denies recreational drug use. He is  retired and formerly worked in Social worker.  ROS: Constitutional: no weight change, no fever ENT/Mouth: no sore throat, no rhinorrhea Eyes: no eye pain, no vision changes Cardiovascular: no chest pain, no dyspnea,  no edema, no palpitations Respiratory: no cough, no sputum, no wheezing Gastrointestinal: no nausea, no vomiting, no diarrhea, no constipation Genitourinary: no urinary incontinence, no dysuria, no hematuria Musculoskeletal: no arthralgias, no myalgias Skin: no skin lesions, no pruritus, Neuro: + weakness, no loss of consciousness, no syncope Psych: no anxiety, no depression, + decrease appetite Heme/Lymph: no bruising, no bleeding  ED Course: Discussed with ED provider, patient requiring hospitalization for RUQ pain.  Assessment/Plan  Principal Problem:   Abdominal pain Active Problems:   Persistent atrial fibrillation (HCC)   Essential hypertension   Tobacco use   Diabetes mellitus without complication (HCC)   Morbid obesity (HCC)   AF (paroxysmal atrial fibrillation) (HCC)   Hyperlipidemia   Metal foreign body in head   Elevated bilirubin   Assessment and Plan:  * Abdominal pain Suspect secondary to cholecystitis - Morphine 4 mg IV every 4 hours as needed for moderate pain, 4 doses ordered; Dilaudid 0.5 mg IV every 4 hours as needed for severe pain, 3 doses ordered  Elevated bilirubin With abdominal pain - Concerning for cholecystitis versus CBD - Ceftriaxone and metronidazole ordered - X-ray of cervical neck has been ordered due to patient endorsing metal embedded in his skin - LFTs in a.m. - AM team to consult GI/general surgery as appropriate  Metal foreign body in head - MRCP not ordered on  admission due to multiple metallic shrapnel remnants - AM team to call radiology to determine if MRCP would be safe  Hyperlipidemia - Atorvastatin resumed on admission due to elevated bili  Morbid obesity (HCC) - This meets criteria for morbid  obesity based on the presence of 1 or more chronic comorbidities. Patient has hypertension. This complicates overall care and prognosis.   Essential hypertension - Irbesartan 150 mg daily, metoprolol succinate 100 mg daily, spironolactone 25 mg daily resumed  Persistent atrial fibrillation (HCC) - Resumed home metoprolol succinate 100 mg daily  Chart reviewed.   DVT prophylaxis: Heparin 5000 units subcutaneous Code Status: Full code Diet: N.p.o. Family Communication: No, phone call was offered to call his daughter patient declined Disposition Plan: Pending clinical course Consults called: None at this time Admission status: Telemetry medical, inpatient  Past Medical History:  Diagnosis Date   Arthritis    Ascending aortic aneurysm (HCC)    a. 09/2017 Stable TAA - 5.1cm.   Asthma    Chronic systolic CHF (congestive heart failure) (HCC)    a. EF 25-30% by echo in 07/2016 with cath showing no significant CAD b. 01/2017: EF 30-35% with diffuse HK and moderate MR; c. 07/2017 Echo: EF 30-35%, diff hK. Mild MR, mildly dil LA. PASP .   Diverticulitis    Diverticulitis of large intestine with perforation without abscess or bleeding 05/13/2017   Hypertension    Hyperthyroidism    NICM (nonischemic cardiomyopathy) (HCC)    Noncompliance    Persistent atrial fibrillation (HCC)    a. CHA2DS2VASc = 3-->Eliquis (? compliance).   Past Surgical History:  Procedure Laterality Date   COLONOSCOPY N/A 08/27/2019   Procedure: COLONOSCOPY;  Surgeon: Toledo, Boykin Nearing, MD;  Location: [facility] ENDOSCOPY;  Service: Gastroenterology;  Laterality: N/A;   JOINT REPLACEMENT Right    RIGHT/LEFT HEART CATH AND CORONARY ANGIOGRAPHY N/A 08/02/2016   Procedure: Right/Left Heart Cath and Coronary Angiography;  Surgeon: Iran Ouch, MD;  Location: [facility] INVASIVE CV LAB;  Service: Cardiovascular;  Laterality: N/A;   TESTICLE SURGERY     Patient states that he had to have the tube fixed.   Social History:   reports that he has been smoking pipe and cigarettes. He has a 6.25 pack-year smoking history. He has never used smokeless tobacco. He reports current alcohol use. He reports that he does not use drugs.  No Known Allergies Family History  Problem Relation Age of Onset   Diabetes Mother    Diabetes Father    Heart disease Brother    Heart attack Brother    Diabetes Brother    Kidney failure Brother    Thyroid disease Neg Hx    Family history: Family history reviewed and not pertinent.  Prior to Admission medications   Medication Sig Start Date End Date Taking? Authorizing Provider  acetaminophen (TYLENOL) 500 MG tablet Take 500-1,000 mg by mouth every 6 (six) hours as needed for mild pain, fever or headache.    Yes [provider]  albuterol (VENTOLIN HFA) 108 (90 Base) MCG/ACT inhaler TAKE 2 PUFFS BY MOUTH EVERY 6 HOURS AS NEEDED FOR WHEEZE OR SHORTNESS OF BREATH 10/26/21  Yes Eakes, Megan P, DO  atorvastatin (LIPITOR) 40 MG tablet TAKE 1 TABLET BY MOUTH EVERY DAY 05/28/22  Yes Sisley, Megan P, DO  budesonide-formoterol (SYMBICORT) 160-4.5 MCG/ACT inhaler Inhale 2 puffs into the lungs in the morning and at bedtime.   Yes [provider]  furosemide (LASIX) 40 MG tablet TAKE 1 TABLET BY  MOUTH EVERY DAY 03/01/22  Yes Tunison, Megan P, DO  hydrocortisone (ANUSOL-HC) 25 MG suppository One suppository twice a day as needed for hemmorhoids, (okay to substitute generic) 10/30/20  Yes Gamino, Megan P, DO  irbesartan (AVAPRO) 150 MG tablet TAKE 1 TABLET (150 MG TOTAL) BY MOUTH DAILY. OFFICE VISIT NEEDED FOR ADDITIONAL REFILLS 06/14/22  Yes Frazzini, Megan P, DO  JARDIANCE 10 MG TABS tablet TAKE 1 TABLET BY MOUTH DAILY BEFORE BREAKFAST. 05/04/22  Yes Iran Ouch, MD  KLOR-CON M20 20 MEQ tablet TAKE 1 TABLET BY MOUTH EVERY DAY 05/28/22  Yes Lile, Megan P, DO  metFORMIN (GLUCOPHAGE-XR) 500 MG 24 hr tablet TAKE 1 TABLET BY MOUTH IN THE MORNING AND AT BEDTIME. OFFICE VISIT NEEDED  FOR ADDITIONAL REFILLS 06/14/22  Yes Delancey, Megan P, DO  metoprolol succinate (TOPROL-XL) 100 MG 24 hr tablet Take 1 tablet (100 mg total) by mouth daily. TAKE WITH OR IMMEDIATELY FOLLOWING A MEAL.PLEASE CALL OFFICE TO SCHEDULE FOLLOW  UP APPOINTMENT 06/19/21  Yes Agbor-Etang, Arlys John, MD  spironolactone (ALDACTONE) 25 MG tablet TAKE 1 TABLET (25 MG TOTAL) BY MOUTH DAILY. 02/16/22  Yes Leask, Megan P, DO  thiamine 100 MG tablet Take 1 tablet (100 mg total) by mouth daily. 10/30/20  Yes Dunlap, Megan P, DO  budesonide-formoterol (SYMBICORT) 160-4.5 MCG/ACT inhaler TAKE 2 PUFFS BY MOUTH TWICE A DAY 12/18/21   Jaster, Megan P, DO  erythromycin ophthalmic ointment Place 1 Application into the right eye at bedtime. Apply 1/2 inch of Erythromycin to lower eyelid before bed. Patient not taking: Reported on 06/14/2022 05/24/22   Orvil Feil, PA-C  folic acid (FOLVITE) 1 MG tablet TAKE 1 TABLET BY MOUTH EVERY DAY Patient not taking: Reported on 06/14/2022 09/24/21   Olevia Perches P, DO  hydroxypropyl methylcellulose / hypromellose (ISOPTO TEARS / GONIOVISC) 2.5 % ophthalmic solution Place 1 drop into the right eye 3 (three) times daily as needed for dry eyes. Patient not taking: Reported on 06/14/2022 05/24/22   Orvil Feil, PA-C   Physical Exam: Vitals:   06/14/22 2030 06/14/22 2100 06/14/22 2234 06/15/22 0005  BP: (!) 136/123 (!) 128/101  (!) 131/107  Pulse: (!) 27 94  95  Resp: 17 19  18   Temp:   97.6 F (36.4 C) 97.8 F (36.6 C)  TempSrc:   Oral Oral  SpO2: 96% 95%  98%  Weight:      Height:       Constitutional: appears age-appropriate, NAD, calm, comfortable Eyes: PERRL, lids and conjunctivae normal ENMT: Mucous membranes are moist. Posterior pharynx clear of any exudate or lesions. Age-appropriate dentition. Hearing appropriate Neck: normal, supple, no masses, no thyromegaly Respiratory: clear to auscultation bilaterally, no wheezing, no crackles. Normal respiratory effort. No accessory  muscle use.  Cardiovascular: Regular rate and rhythm, no murmurs / rubs / gallops. No extremity edema. 2+ pedal pulses. No carotid bruits.  Abdomen: no tenderness, no masses palpated, no hepatosplenomegaly. Bowel sounds positive.  Musculoskeletal: no clubbing / cyanosis. No joint deformity upper and lower extremities. Good ROM, no contractures, no atrophy. Normal muscle tone.  Skin: no rashes, lesions, ulcers. No induration Neurologic: Sensation intact. Strength 5/5 in all 4.  Psychiatric: Normal judgment and insight. Alert and oriented x 3. Normal mood.   EKG: independently reviewed, showing atrial fibrillation, with rate of 119, qtc is 507  Chest x-ray on Admission: I personally reviewed and I agree with radiologist reading as below.  DG Cervical Spine 2 or 3 views  Result Date:  06/14/2022 CLINICAL DATA:  MRI clearance EXAM: CERVICAL SPINE - 2-3 VIEW COMPARISON:  None Available. FINDINGS: There are multiple metallic shrapnel remnants noted abutting the right occipital bone measuring up to 10 mm in greatest dimension. Visualized calvarium is intact. Mild degenerative changes are seen within the cervical spine. IMPRESSION: 1. Multiple metallic shrapnel remnants abutting the right occipital bone. Electronically Signed   By: Helyn Numbers M.D.   On: 06/14/2022 22:32   US Abdomen Limited RUQ (LIVER/GB)  Result Date: 06/14/2022 CLINICAL DATA:  Follow-up possible abnormal gallbladder noted on a current CTA chest, abdomen and pelvis. Patient reportedly with chest pain, belching and left-sided abdominal pain. EXAM: ULTRASOUND ABDOMEN LIMITED RIGHT UPPER QUADRANT COMPARISON:  None Available. FINDINGS: Gallbladder: Heterogeneous echogenicity material lies along the wall adjacent to the liver, overall measuring approximately 4 x 1.2 x 2.5 cm. Wall appears thickened, up to 8 mm, although some of this measurement may be including pericholecystic fat. No pericholecystic fluid. No shadowing stone. No sonographic  Murphy's sign. Common bile duct: Diameter: 2 mm Liver: Slight nodular contour. Mild increased parenchymal echogenicity. No mass. Portal vein is patent on color Doppler imaging with normal direction of blood flow towards the liver. Other: None. IMPRESSION: 1. Abnormal appearance of the gallbladder. There is apparent material within the gallbladder, non mobile, along the wall adjacent to the liver. This could potentially reflect focal wall edema due to adjacent liver pathology. It could reflect adherent sludge. This material contains internal cystic areas which argues against neoplasm, although neoplasm not fully excluded. Findings could be further assessed, when the patient can better tolerate the procedure, with liver MRI without and with contrast including MRCP. 2. No pericholecystic fluid, no stones and no sonographic Murphy's sign to indicate acute cholecystitis. 3. Subtle liver surface nodularity. Consider cirrhosis. Increased liver parenchymal echogenicity consistent with hepatic steatosis. Electronically Signed   By: Amie Portland M.D.   On: 06/14/2022 17:35   CT Angio Chest/Abd/Pel for Dissection W and/or Wo Contrast  Result Date: 06/14/2022 CLINICAL DATA:  Chest pain. Loss of belching. Left-sided abdominal pain. Concern for acute aortic syndrome. EXAM: CT ANGIOGRAPHY CHEST, ABDOMEN AND PELVIS TECHNIQUE: Non-contrast CT of the chest was initially obtained. Multidetector CT imaging through the chest, abdomen and pelvis was performed using the standard protocol during bolus administration of intravenous contrast. Multiplanar reconstructed images and MIPs were obtained and reviewed to evaluate the vascular anatomy. RADIATION DOSE REDUCTION: This exam was performed according to the departmental dose-optimization program which includes automated exposure control, adjustment of the mA and/or kV according to patient size and/or use of iterative reconstruction technique. CONTRAST:  OMNIPAQUE IOHEXOL 350  MG/ML SOLN COMPARISON:  08/04/2021. FINDINGS: CTA CHEST FINDINGS Cardiovascular: Mild-to-moderate cardiac enlargement. Trace pericardial effusion. Three-vessel coronary artery calcifications. Ascending thoracic aorta measures 4.7 cm in diameter. No aortic dissection. Mild aortic atherosclerosis mostly along the mid to lower descending portion. Arch branch vessels are widely patent. Mediastinum/Nodes: No neck base, mediastinal or hilar masses. Scattered prominent lymph nodes. Ascus level right paratracheal node, 1.1 cm short axis. Subcarinal node, 1.5 cm short axis. Trachea and esophagus are unremarkable. Lungs/Pleura: Mild centrilobular emphysema. Minor linear subsegmental atelectasis, dependent right middle lobe, base of the left upper lobe lingula. No evidence of pneumonia or pulmonary edema. No pleural effusion or pneumothorax. Musculoskeletal: No fracture or acute finding. No chest wall mass. Bilateral gynecomastia. Review of the MIP images confirms the above findings. CTA ABDOMEN AND PELVIS FINDINGS VASCULAR Aorta: Focal fusiform dilation of the proximal aorta, at and  just below the level of the renal arteries, maximum diameter of 3.8 cm, associated with eccentric atherosclerotic plaque and mural thrombus, unchanged compared to the prior CT. Moderate atherosclerotic plaque, partly calcified. No dissection. Infrarenal portion dilated to 3 cm. Celiac: Plaque at the origin. No significant stenosis. No aneurysm or dissection. SMA: Mild plaque at the origin. No significant stenosis. No aneurysm or dissection. Renals: Both renal arteries are patent without evidence of aneurysm, dissection, vasculitis, fibromuscular dysplasia or significant stenosis. IMA: Faintly identified, small but patent IMA. Inflow: Minimally opacified common and proximal external iliac vessels. More distally these are not opacified. 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 This is similar to the prior CT. Plaque and narrowing of these  vessels cannot be accurately assessed due to limited contrast enhancement. No convincing change from the prior CT, however. Veins: No obvious venous abnormality within the limitations of this arterial phase study. Review of the MIP images confirms the above findings. NON-VASCULAR Hepatobiliary: Overall relative decreased liver attenuation. Subtle nodularity suggested, assessment somewhat limited by patient motion. Liver normal in overall size. No defined mass. Gallbladder margins are indistinct. Haziness in the adjacent fat extending to abut the first and second portions of the duodenum. No defined stone. No convincing duct dilation. Pancreas: Partial fatty replacement of the pancreas. No mass or duct dilation. No convincing inflammation. Spleen: Normal in size without focal abnormality. Adrenals/Urinary Tract: No adrenal masses. No renal masses, stones or hydronephrosis. Normal ureters. Bladder unremarkable. Stomach/Bowel: Normal stomach. Small bowel and colon are normal in caliber. No wall thickening. No inflammation. Scattered colonic diverticula. Lymphatic: No enlarged lymph nodes. Reproductive: Unremarkable. Other: Anterior hernia mesh. Minimal fat containing umbilical hernia. Appear stable from the prior CT. No ascites. Musculoskeletal: No fracture or acute finding. No bone lesion. Degenerative changes throughout the lumbar spine. Advanced arthropathic changes of the left hip. Review of the MIP images confirms the above findings. IMPRESSION: CTA 1. No acute findings. No aortic dissection, intramural hematoma or convincing penetrating ulcer. No change when compared to the CT from 08/04/2021. 2. Dilated ascending thoracic aorta, prominent descending portion, also stable. Current maximum dimension of the ascending portion, 4.9 cm at the sino-tubular junction, 4.8 cm of the ascending portion at the level of the main pulmonary artery. Ascending thoracic aortic aneurysm. Recommend semi-annual imaging followup by CTA  or MRA and referral to cardiothoracic surgery if not already obtained. This recommendation follows 2010 ACCF/AHA/AATS/ACR/ASA/SCA/SCAI/SIR/STS/SVM Guidelines for the Diagnosis and Management of Patients With Thoracic Aortic Disease. Circulation. 2010; 121: O294-T654. Aortic aneurysm NOS (ICD10-I71.9) 3. Focal dilation of the proximal abdominal aorta to 3.8 cm associated with eccentric mural thrombus, unchanged from the prior CT. Infrarenal portion dilated two 3 cm. 4. 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 NON CTA 1. Gallbladder distended with an indistinct wall an adjacent hazy fat infiltration. Consider acute cholecystitis in the proper clinical setting, which could be further assessed with limited right upper quadrant ultrasound. No visualized stones. 2. No other evidence of an acute abnormality within the chest, abdomen or pelvis. 3. Multiple chronic findings as detailed above stable compared to the prior CT. Electronically Signed   By: Lajean Manes M.D.   On: 06/14/2022 16:09   DG Chest 2 View  Result Date: 06/14/2022 CLINICAL DATA:  Chest pain and shortness of breath EXAM: CHEST - 2 VIEW COMPARISON:  CTA chest dated 08/04/2021, chest radiograph dated 08/24/2019 FINDINGS: Normal lung volumes. No focal consolidations. No pleural effusion or pneumothorax. Enlarged cardiomediastinal silhouette. The visualized skeletal  structures are unremarkable. IMPRESSION: 1. No active cardiopulmonary disease. 2. Cardiomegaly. Electronically Signed   By: Agustin Cree M.D.   On: 06/14/2022 13:17    Labs on Admission: I have personally reviewed following labs  CBC: Recent Labs  Lab 06/14/22 1224  WBC 5.4  HGB 13.7  HCT 42.3  MCV 89.6  PLT 159   Basic Metabolic Panel: Recent Labs  Lab 06/14/22 1224 06/14/22 1835  NA 133*  --   K 3.7  --   CL 101  --   CO2 21*  --   GLUCOSE 113*  --   BUN 14  --   CREATININE 0.76  --   CALCIUM 9.1  --   MG  --  2.0  PHOS  --  4.2   GFR: Estimated Creatinine  Clearance: 114.5 mL/min (by C-G formula based on SCr of 0.76 mg/dL).  Liver Function Tests: Recent Labs  Lab 06/14/22 1413  AST 23  ALT 20  ALKPHOS 64  BILITOT 1.4*  PROT 7.5  ALBUMIN 4.0   Coagulation Profile: Recent Labs  Lab 06/14/22 1224  INR 1.0   Urine analysis:    Component Value Date/Time   COLORURINE YELLOW (A) 11/22/2016 1446   APPEARANCEUR Cloudy (A) 06/18/2021 1404   LABSPEC 1.008 11/22/2016 1446   PHURINE 6.0 11/22/2016 1446   GLUCOSEU Negative 06/18/2021 1404   HGBUR NEGATIVE 11/22/2016 1446   BILIRUBINUR Negative 06/18/2021 1404   KETONESUR NEGATIVE 11/22/2016 1446   PROTEINUR 1+ (A) 06/18/2021 1404   PROTEINUR NEGATIVE 11/22/2016 1446   NITRITE Negative 06/18/2021 1404   NITRITE NEGATIVE 11/22/2016 1446   LEUKOCYTESUR Negative 06/18/2021 1404   This document was prepared using Dragon Voice Recognition software and may include unintentional dictation errors.  Dr. Sedalia Muta [facility]  If 7PM-7AM, please contact overnight-coverage provider If 7AM-7PM, please contact day coverage provider [email or web address]  06/15/2022, 12:46 AM

## 2022-06-14 NOTE — ED Provider Notes (Signed)
[facility] Provider Note    Event Date/Time   First MD Initiated Contact with Patient 06/14/22 1253     (approximate)   History   Chest Pain and Shortness of Breath   HPI  Damian Buckles is a 72 y.o. male past medical history of systolic heart failure, A-fib, asthma who presents with shortness of breath.  Patient said symptoms started around 10:00.  He endorses pressure-like in the center of his chest as well as dyspnea.  Says that he had this pain before when he was in A-fib.  Described as pressure nonradiating worse with exertion and with taking deep breath.  Feels like he cannot catch his breath.  Has had some cough productive of clear sputum denies fevers chills.  He is not sure if he had any lower extremity swelling.  She does not last week he missed several dose of medication because he ran out but he now has a new pillbox and has been taking it less last week.  Tells me he is compliant with his Eliquis this week.   Past Medical History:  Diagnosis Date   Arthritis    Ascending aortic aneurysm (HCC)    a. 09/2017 Stable TAA - 5.1cm.   Asthma    Chronic systolic CHF (congestive heart failure) (HCC)    a. EF 25-30% by echo in 07/2016 with cath showing no significant CAD b. 01/2017: EF 30-35% with diffuse HK and moderate MR; c. 07/2017 Echo: EF 30-35%, diff hK. Mild MR, mildly dil LA. PASP .   Diverticulitis    Diverticulitis of large intestine with perforation without abscess or bleeding 05/13/2017   Hypertension    Hyperthyroidism    NICM (nonischemic cardiomyopathy) (HCC)    Noncompliance    Persistent atrial fibrillation (HCC)    a. CHA2DS2VASc = 3-->Eliquis (? compliance).    Patient Active Problem List   Diagnosis Date Noted   Abdominal pain 06/14/2022   Aortic atherosclerosis (HCC) 08/27/2020   AF (paroxysmal atrial fibrillation) (HCC)    Hyperlipidemia    Gastrointestinal hemorrhage 08/24/2019   Morbid obesity (HCC) 12/11/2018    Chronic pain of right knee 12/11/2018   Advance directive discussed with patient 05/16/2018   Diabetes mellitus without complication (HCC) 04/11/2018   Keratitis due to infection 07/06/2017   Corneal ulcer 06/24/2017   COPD (chronic obstructive pulmonary disease) (HCC) 02/03/2017   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) 12/15/2016   AAA (abdominal aortic aneurysm) (HCC) 11/29/2016   Chronic low back pain 11/29/2016   Alcohol abuse 11/29/2016   Hyponatremia 11/22/2016   Chronic systolic heart failure (HCC) 08/19/2016   Tobacco use 08/19/2016   Snoring 08/19/2016   Persistent atrial fibrillation (HCC) 07/28/2016   Hyperthyroidism 07/28/2016   Noncompliance with medications 07/28/2016   Essential hypertension 07/28/2016     Physical Exam  Triage Vital Signs: ED Triage Vitals [06/14/22 1222]  Enc Vitals Group     BP (!) 145/96     Pulse Rate (!) 126     Resp 20     Temp 97.9 F (36.6 C)     Temp src      SpO2 97 %     Weight 270 lb (122.5 kg)     Height 6' (1.829 m)     Head Circumference      Peak Flow      Pain Score 10     Pain Loc      Pain Edu?  Excl. in [place]?     Most recent vital signs: Vitals:   06/14/22 1615 06/14/22 1830  BP: 110/87 (!) 118/98  Pulse: (!) 103   Resp: 14 20  Temp:  97.9 F (36.6 C)  SpO2: 95% 98%     General: Awake, no distress.  CV:  Good peripheral perfusion.  Trace pitting edema bilateral lower extremities Resp:  Normal effort.  Patient is mildly dyspneic with moving around on the stretcher, lungs sound clear Abd:  No distention.  Neuro:             Awake, Alert, Oriented x 3  Other:     ED Results / Procedures / Treatments  Labs (all labs ordered are listed, but only abnormal results are displayed) Labs Reviewed  BASIC METABOLIC PANEL - Abnormal; Notable for the following components:      Result Value   Sodium 133 (*)    CO2 21 (*)    Glucose, Bld 113 (*)    All other components within normal limits  BRAIN  NATRIURETIC PEPTIDE - Abnormal; Notable for the following components:   B Natriuretic Peptide 575.5 (*)    All other components within normal limits  HEPATIC FUNCTION PANEL - Abnormal; Notable for the following components:   Total Bilirubin 1.4 (*)    Bilirubin, Direct 0.4 (*)    Indirect Bilirubin 1.0 (*)    All other components within normal limits  TROPONIN I (HIGH SENSITIVITY) - Abnormal; Notable for the following components:   Troponin I (High Sensitivity) 25 (*)    All other components within normal limits  TROPONIN I (HIGH SENSITIVITY) - Abnormal; Notable for the following components:   Troponin I (High Sensitivity) 28 (*)    All other components within normal limits  RESP PANEL BY RT-PCR (RSV, FLU A&B, COVID)  RVPGX2  CULTURE, BLOOD (ROUTINE X 2)  CULTURE, BLOOD (ROUTINE X 2)  CBC  PROTIME-INR  CBC  PHOSPHORUS  MAGNESIUM  BASIC METABOLIC PANEL  CBC  LACTIC ACID, PLASMA  LACTIC ACID, PLASMA     EKG Reviewed interpreted the EKG which shows A-fib with RVR left axis deviation and poor R wave progression  RADIOLOGY  I reviewed and interpreted the chest x-ray which shows cardiomegaly   PROCEDURES:  Critical Care performed: Yes, see critical care procedure note(s)  .1-3 Lead EKG Interpretation  Performed by: Rada Hay, MD Authorized by: Rada Hay, MD     Interpretation: abnormal     ECG rate assessment: tachycardic     Rhythm: atrial fibrillation     Ectopy: none     Conduction: normal   .Critical Care  Performed by: Rada Hay, MD Authorized by: Rada Hay, MD   Critical care provider statement:    Critical care time (minutes):  30   Critical care was time spent personally by me on the following activities:  Development of treatment plan with patient or surrogate, discussions with consultants, evaluation of patient's response to treatment, examination of patient, ordering and review of laboratory studies, ordering and review  of radiographic studies, ordering and performing treatments and interventions, pulse oximetry, re-evaluation of patient's condition and review of old charts   The patient is on the cardiac monitor to evaluate for evidence of arrhythmia and/or significant heart rate changes.   MEDICATIONS ORDERED IN ED: Medications  ondansetron (ZOFRAN) injection 4 mg (4 mg Intravenous Not Given 06/14/22 1458)  nitroGLYCERIN (NITROSTAT) SL tablet 0.4 mg (0.4 mg Sublingual Given 06/14/22 1456)  acetaminophen (TYLENOL) tablet 650 mg (has no administration in time range)    Or  acetaminophen (TYLENOL) suppository 650 mg (has no administration in time range)  ondansetron (ZOFRAN) tablet 4 mg (has no administration in time range)    Or  ondansetron (ZOFRAN) injection 4 mg (has no administration in time range)  heparin injection 5,000 Units (has no administration in time range)  senna-docusate (Senokot-S) tablet 1 tablet (has no administration in time range)  metroNIDAZOLE (FLAGYL) IVPB 500 mg (has no administration in time range)  cefTRIAXone (ROCEPHIN) 2 g in sodium chloride 0.9 % 100 mL IVPB (2 g Intravenous New Bag/Given 06/14/22 1853)  metoprolol tartrate (LOPRESSOR) injection 5 mg (5 mg Intravenous Given 06/14/22 1427)  furosemide (LASIX) injection 40 mg (40 mg Intravenous Given 06/14/22 1416)  aspirin chewable tablet 324 mg (324 mg Oral Given 06/14/22 1411)  metoprolol tartrate (LOPRESSOR) injection 5 mg (5 mg Intravenous Given 06/14/22 1519)  alum & mag hydroxide-simeth (MAALOX/MYLANTA) 200-200-20 MG/5ML suspension 30 mL (30 mLs Oral Given 06/14/22 1519)  iohexol (OMNIPAQUE) 350 MG/ML injection 100 mL (100 mLs Intravenous Contrast Given 06/14/22 1531)  HYDROmorphone (DILAUDID) injection 0.5 mg (0.5 mg Intravenous Given 06/14/22 1632)  HYDROmorphone (DILAUDID) injection 0.5 mg (0.5 mg Intravenous Given 06/14/22 1831)     IMPRESSION / MDM / ASSESSMENT AND PLAN / ED COURSE  I reviewed the triage vital signs and the nursing  notes.                              Patient's presentation is most consistent with acute presentation with potential threat to life or bodily function.  Differential diagnosis includes, but is not limited to, CHF exacerbation, tachycardia due to cardiomyopathy, pulmonary embolism, ACS, pneumothorax, pleural effusion  The patient is a 72 year old male with underlying A-fib's and severely reduced EF presenting with dyspnea that started last night.  Patient is in A-fib with RVR heart rates in the 120s-150s, he is satting in the mid 90s on room air.  Does look dyspneic but lungs are clear he has some pitting edema in the extremities.  Tells me he is having chest pain that started around the same time as the dyspnea that is both pleuritic and exertional.  Says it feels similar to when he had A-fib in the past.  His compliance of medications is questionable says he missed several doses last week but has a new pill box that he has been taking over the last week.  Tells me he is on Eliquis but I see that previously it was stopped in setting of GI bleed so unclear if he is actually on Eliquis.  Patient's chest x-ray shows cardiomegaly but no obvious pulmonary edema.  Plan to give a dose of metoprolol for heart rate control given want to avoid calcium channel blockers in the setting of CHF.  Will give a dose of Lasix aspirin and obtain CTA of the chest rule out pulmonary embolism as a cause of his symptoms.  Patient developed worsening CP, looks uncomfortable. Will change to CTA dissection study. Pain responded to nitroglycerin.   Patient signed out to oncoming provider pending CTA.   Clinical Course as of 06/14/22 1900  Mon Jun 14, 2022  1536 History of HFrEF (30%), asthma, chest pain and dyspnea. Afib w/ RVR in the 150s, questionable compliance with home medications. Pain is pleuritic and epigastric.  Trop 28. Metop 5 x 2 and given nitro. Dissection study now.  Repeat trop. []  f/u CTA and admit. Rate  control.  [SM]    Clinical Course User Index [SM] , MD     FINAL CLINICAL IMPRESSION(S) / ED DIAGNOSES   Final diagnoses:  Chest pain, unspecified type  Atrial fibrillation with RVR (HCC)     Rx / DC Orders   ED Discharge Orders     None        Note:  This document was prepared using Dragon voice recognition software and may include unintentional dictation errors.   Corena Herter, MD 06/14/22 1900

## 2022-06-14 NOTE — ED Notes (Signed)
Pt crying out severe chest pain, reports pain worse right after he urinates - resolved after a few minutes.

## 2022-06-14 NOTE — Assessment & Plan Note (Signed)
-   Atorvastatin resumed on admission due to elevated bili

## 2022-06-14 NOTE — ED Notes (Signed)
Confirmed with Dr. Starleen Blue to GIVE IV metoprolol for HR 110-130s and BP 111/91

## 2022-06-14 NOTE — Hospital Course (Signed)
Mr. Areon Cocuzza is a 72 year old male with hypertension, hyperlipidemia, non-insulin-dependent diabetes mellitus, who presents emergency department for chief concerns of chest pain and shortness of breath.  Initial vitals in the emergency department showed temperature of 97.9, respiration rate of 20, heart rate of 126, blood pressure 145/96, SpO2 of 97% on room air.  Serum sodium is 133, potassium 3.7, chloride 101, bicarb 21, BUN of 14, serum creatinine of 0.76, EGFR greater than 60, nonfasting blood glucose 113, WBC 5.4, hemoglobin 13.7, platelets of 159.  BNP was elevated at 575.5.  High sensitive troponin was 25 and on repeat was 28.  COVID/influenza A/influenza B/RSV PCR were negative.  ED treatment: Maalox PO, aspirin 324 mg p.o. one-time dose, furosemide 40 mg IV, Dilaudid 0.5 mg IV, metoprolol 5 mg IV x 2, sublingual nitroglycerin 0.4 mg one-time dose ordered.

## 2022-06-14 NOTE — ED Notes (Signed)
Report to [place], Therapist, sports

## 2022-06-14 NOTE — Assessment & Plan Note (Signed)
-   Irbesartan 150 mg daily, metoprolol succinate 100 mg daily, spironolactone 25 mg daily resumed

## 2022-06-15 ENCOUNTER — Inpatient Hospital Stay (HOSPITAL_COMMUNITY)
Admit: 2022-06-15 | Discharge: 2022-06-15 | Disposition: A | Discharge status: Home / Self Care | Payer: Medicare HMO | Attending: Physician Assistant | Admitting: Physician Assistant

## 2022-06-15 ENCOUNTER — Inpatient Hospital Stay: Payer: Medicare HMO

## 2022-06-15 ENCOUNTER — Encounter: Payer: Self-pay | Admitting: Internal Medicine

## 2022-06-15 DIAGNOSIS — R1011 Right upper quadrant pain: Secondary | ICD-10-CM | POA: Diagnosis not present

## 2022-06-15 DIAGNOSIS — I5021 Acute systolic (congestive) heart failure: Secondary | ICD-10-CM | POA: Diagnosis not present

## 2022-06-15 DIAGNOSIS — I4891 Unspecified atrial fibrillation: Secondary | ICD-10-CM | POA: Diagnosis not present

## 2022-06-15 DIAGNOSIS — I716 Thoracoabdominal aortic aneurysm, without rupture, unspecified: Secondary | ICD-10-CM

## 2022-06-15 DIAGNOSIS — R079 Chest pain, unspecified: Secondary | ICD-10-CM | POA: Diagnosis not present

## 2022-06-15 DIAGNOSIS — I5023 Acute on chronic systolic (congestive) heart failure: Secondary | ICD-10-CM

## 2022-06-15 DIAGNOSIS — I4819 Other persistent atrial fibrillation: Secondary | ICD-10-CM

## 2022-06-15 DIAGNOSIS — I714 Abdominal aortic aneurysm, without rupture, unspecified: Secondary | ICD-10-CM

## 2022-06-15 LAB — CBC
HCT: 41.8 % (ref 39.0–52.0)
Hemoglobin: 13.6 g/dL (ref 13.0–17.0)
MCH: 28.6 pg (ref 26.0–34.0)
MCHC: 32.5 g/dL (ref 30.0–36.0)
MCV: 87.8 fL (ref 80.0–100.0)
Platelets: 169 10*3/uL (ref 150–400)
RBC: 4.76 MIL/uL (ref 4.22–5.81)
RDW: 14.8 % (ref 11.5–15.5)
WBC: 5.5 10*3/uL (ref 4.0–10.5)
nRBC: 0 % (ref 0.0–0.2)

## 2022-06-15 LAB — APTT: aPTT: 31 seconds (ref 24–36)

## 2022-06-15 LAB — BASIC METABOLIC PANEL
Anion gap: 12 (ref 5–15)
BUN: 21 mg/dL (ref 8–23)
CO2: 23 mmol/L (ref 22–32)
Calcium: 9 mg/dL (ref 8.9–10.3)
Chloride: 99 mmol/L (ref 98–111)
Creatinine, Ser: 1 mg/dL (ref 0.61–1.24)
GFR, Estimated: >60 mL/min (ref >60)
Glucose, Bld: 134 mg/dL — ABNORMAL HIGH (ref 70–99)
Potassium: 4.1 mmol/L (ref 3.5–5.1)
Sodium: 134 mmol/L — ABNORMAL LOW (ref 135–145)

## 2022-06-15 LAB — LACTIC ACID, PLASMA: Lactic Acid, Venous: 2.2 mmol/L (ref 0.5–1.9)

## 2022-06-15 LAB — HEPATIC FUNCTION PANEL
ALT: 31 U/L (ref 0–44)
AST: 39 U/L (ref 15–41)
Albumin: 4.3 g/dL (ref 3.5–5.0)
Alkaline Phosphatase: 68 U/L (ref 38–126)
Bilirubin, Direct: 0.4 mg/dL — ABNORMAL HIGH (ref 0.0–0.2)
Indirect Bilirubin: 1.4 mg/dL — ABNORMAL HIGH (ref 0.3–0.9)
Total Bilirubin: 1.8 mg/dL — ABNORMAL HIGH (ref 0.3–1.2)
Total Protein: 8 g/dL (ref 6.5–8.1)

## 2022-06-15 LAB — LIPASE, BLOOD: Lipase: 27 U/L (ref 11–51)

## 2022-06-15 MED ORDER — SODIUM CHLORIDE 0.9 % IV SOLN: INTRAVENOUS | Status: DC | PRN

## 2022-06-15 MED ORDER — ALUM & MAG HYDROXIDE-SIMETH 200-200-20 MG/5ML PO SUSP
30.0000 mL | Freq: Once | ORAL | Status: AC
  Administered 2022-06-15: 30 mL via ORAL
  Filled 2022-06-15: qty 30

## 2022-06-15 MED ORDER — HEPARIN (PORCINE) 25000 UT/250ML-% IV SOLN
1850.0000 [IU]/h | INTRAVENOUS | Status: DC
  Administered 2022-06-15: 1600 [IU]/h via INTRAVENOUS
  Administered 2022-06-16: 1850 [IU]/h via INTRAVENOUS
  Administered 2022-06-16: 1600 [IU]/h via INTRAVENOUS
  Administered 2022-06-17: 1850 [IU]/h via INTRAVENOUS
  Filled 2022-06-15 (×4): qty 250

## 2022-06-15 MED ORDER — CHLORHEXIDINE GLUCONATE 4 % EX LIQD
60.0000 mL | Freq: Once | CUTANEOUS | Status: AC
  Administered 2022-06-16: 4 via TOPICAL

## 2022-06-15 MED ORDER — LIDOCAINE VISCOUS HCL 2 % MT SOLN
15.0000 mL | Freq: Once | OROMUCOSAL | Status: AC
  Administered 2022-06-15: 15 mL via ORAL
  Filled 2022-06-15: qty 15

## 2022-06-15 MED ORDER — CHLORHEXIDINE GLUCONATE 4 % EX LIQD: 60.0000 mL | Freq: Once | CUTANEOUS | Status: DC

## 2022-06-15 MED ORDER — METOPROLOL TARTRATE 25 MG PO TABS
25.0000 mg | ORAL_TABLET | Freq: Two times a day (BID) | ORAL | Status: DC
  Administered 2022-06-16: 25 mg via ORAL
  Filled 2022-06-15: qty 1

## 2022-06-15 MED ORDER — PANTOPRAZOLE SODIUM 40 MG IV SOLR
40.0000 mg | Freq: Two times a day (BID) | INTRAVENOUS | Status: DC
  Administered 2022-06-15 – 2022-06-26 (×23): 40 mg via INTRAVENOUS
  Filled 2022-06-15 (×23): qty 10

## 2022-06-15 MED ORDER — HEPARIN BOLUS VIA INFUSION
2500.0000 [IU] | Freq: Once | INTRAVENOUS | Status: AC
  Administered 2022-06-15: 2500 [IU] via INTRAVENOUS
  Filled 2022-06-15: qty 2500

## 2022-06-15 MED ORDER — FUROSEMIDE 10 MG/ML IJ SOLN
40.0000 mg | Freq: Once | INTRAMUSCULAR | Status: AC
  Administered 2022-06-15: 40 mg via INTRAVENOUS
  Filled 2022-06-15: qty 4

## 2022-06-15 NOTE — Consult Note (Signed)
ANTICOAGULATION CONSULT NOTE - Initial Consult  Pharmacy Consult for Heparin Indication: atrial fibrillation  No Known Allergies  Patient Measurements: Height: 6' (182.9 cm) Weight: 124.3 kg (274 lb 0.5 oz) IBW/kg (Calculated) : 77.6 Heparin Dosing Weight: 105.2 kg  Vital Signs: Temp: 97.5 F (36.4 C) (01/09 1507) Temp Source: Oral (01/09 1507) BP: 113/79 (01/09 1656) Pulse Rate: 93 (01/09 1656)  Labs: Recent Labs    06/14/22 1224 06/14/22 1413 06/15/22 0304  HGB 13.7  --  13.6  HCT 42.3  --  41.8  PLT 159  --  169  LABPROT 13.4  --   --   INR 1.0  --   --   CREATININE 0.76  --  1.00  TROPONINIHS 25* 28*  --     Estimated Creatinine Clearance: 92.3 mL/min (by C-G formula based on SCr of 1 mg/dL).   Medical History: Past Medical History:  Diagnosis Date   Arthritis    Ascending aortic aneurysm ([place])    a. 09/2017 Stable TAA - 5.1cm.   Asthma    Chronic systolic CHF (congestive heart failure) ([place])    a. EF 25-30% by echo in 07/2016 with cath showing no significant CAD b. 01/2017: EF 30-35% with diffuse HK and moderate MR; c. 07/2017 Echo: EF 30-35%, diff hK. Mild MR, mildly dil LA. PASP 27mmHg.   Diverticulitis    Diverticulitis of large intestine with perforation without abscess or bleeding 05/13/2017   Hypertension    Hyperthyroidism    NICM (nonischemic cardiomyopathy) ([place])    Noncompliance    Persistent atrial fibrillation (HCC)    a. CHA2DS2VASc = 3-->Eliquis (? compliance).    Medications:  No history of chronic anticoagulant use PTA  Assessment: 72yo male with PMH of HFrEF secondary to ischemic cardiomyopathy, persistent atrial fibrillation complicated by medication noncompliance, throacic and abdominal aortic anuerysms, GI bleed in 2021 prompting discontinuation of apixaban who presented to the ED with chest pain and shortness of breath x 3 days. Pharmacy has been consulted to initiate and titrate heparin infusion while admitted.  Baseline labs:  aPTT pending, INR 1.0, Plts 169, Hgb 13.6  Goal of Therapy:  Heparin level 0.3-0.7 units/ml Monitor platelets by anticoagulation protocol: Yes   Plan:  Give 2500 units bolus x 1(will give half of normal bolus since patient receive 5,000 units of heparin subcutaneously) Start heparin infusion at 1600 units/hr Check anti-Xa level in 8 hours and daily while on heparin Continue to monitor H&H and platelets  Jaide Hillenburg A Nael Petrosyan 06/15/2022,5:15 PM

## 2022-06-15 NOTE — Consult Note (Addendum)
GI Inpatient Consult Note  Reason for Consult: choledocholithiasis    Attending Requesting Consult: Dr. Sedalia Muta  History of Present Illness: Eugene Henderson is a 72 y.o. male seen for evaluation of abd pain at the request of Dr. Sedalia Muta. PMHx of hypertension, hyperlipidemia, non-insulin-dependent diabetes mellitus. Present to ER for SOB and CP. Work up initially for CP/SOB w/ CT angio showed gallbladder distention with an indistinct wall an adjacent hazy fat infiltration. F/up RUQ US showed potential edeam due to liver pathology versus sludge, MRI/MRCP recommended . MRCP showed potential sludge in gallbladder, gallbladder wall appeared thickened and edematous w/ pericholecystic fluid. Also had fluid adjacent to head of pancreas & duodenum, lipase was normal. No evidence of choledocholithiasis.   Patient reports approximately 48 hours ago he was at home and developed issues with pain in his lower chest and upper abdomen in the epigastric area as well as shortness of breath.  He reports the pain was associated with a burning aching in his epigastric area and mild nausea.  He denies any vomiting with the pain.  He reports not much of an appetite with the pain but does not feel eating makes the pain worse.  He has chronic issues with reflux and states he is on Nexium at home he thinks.  Unsure of the dose.  He denies any dysphagia.  He reports having fairly regular bowel movements and had a stool yesterday that was loose and dark.  He denies any significant constipation, diarrhea, melena, rectal bleeding.  Patient reports he smokes 8 cigarettes a day, he currently has approximately 12 cans of alcohol a week but reports that this is drastically decreased in 5 years ago was drinking several drinks per day.  He takes ibuprofen 2 times per day for knee and back pain.  He denies any family history of colon polyps, colon cancer. He denies any hx of similar symptoms.   He reports pain has improved some with the pain  medications received in ED, currently 4/10.   Last Colonoscopy: 2021-sigmoid diverticulosis, 70mm polyp rectum, hemorrhoids.  Last Endoscopy: none prior    Past Medical History:  Past Medical History:  Diagnosis Date   Arthritis    Ascending aortic aneurysm (HCC)    a. 09/2017 Stable TAA - 5.1cm.   Asthma    Chronic systolic CHF (congestive heart failure) (HCC)    a. EF 25-30% by echo in 07/2016 with cath showing no significant CAD b. 01/2017: EF 30-35% with diffuse HK and moderate MR; c. 07/2017 Echo: EF 30-35%, diff hK. Mild MR, mildly dil LA. PASP .   Diverticulitis    Diverticulitis of large intestine with perforation without abscess or bleeding 05/13/2017   Hypertension    Hyperthyroidism    NICM (nonischemic cardiomyopathy) (HCC)    Noncompliance    Persistent atrial fibrillation (HCC)    a. CHA2DS2VASc = 3-->Eliquis (? compliance).    Problem List: Patient Active Problem List   Diagnosis Date Noted   Abdominal pain 06/14/2022   Metal foreign body in head 06/14/2022   Elevated bilirubin 06/14/2022   Aortic atherosclerosis (HCC) 08/27/2020   AF (paroxysmal atrial fibrillation) (HCC)    Hyperlipidemia    Gastrointestinal hemorrhage 08/24/2019   Morbid obesity (HCC) 12/11/2018   Chronic pain of right knee 12/11/2018   Advance directive discussed with patient 05/16/2018   Diabetes mellitus without complication (HCC) 04/11/2018   Keratitis due to infection 07/06/2017   Corneal ulcer 06/24/2017   COPD (chronic obstructive pulmonary disease) (HCC) 02/03/2017  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) 12/15/2016   AAA (abdominal aortic aneurysm) (HCC) 11/29/2016   Chronic low back pain 11/29/2016   Alcohol abuse 11/29/2016   Hyponatremia 11/22/2016   Chronic systolic heart failure (HCC) 08/19/2016   Tobacco use 08/19/2016   Snoring 08/19/2016   Persistent atrial fibrillation (HCC) 07/28/2016   Hyperthyroidism 07/28/2016   Noncompliance with medications  07/28/2016   Essential hypertension 07/28/2016    Past Surgical History: Past Surgical History:  Procedure Laterality Date   COLONOSCOPY N/A 08/27/2019   Procedure: COLONOSCOPY;  Surgeon: Toledo, Boykin Nearing, MD;  Location: [facility] ENDOSCOPY;  Service: Gastroenterology;  Laterality: N/A;   JOINT REPLACEMENT Right    RIGHT/LEFT HEART CATH AND CORONARY ANGIOGRAPHY N/A 08/02/2016   Procedure: Right/Left Heart Cath and Coronary Angiography;  Surgeon: Iran Ouch, MD;  Location: [facility] INVASIVE CV LAB;  Service: Cardiovascular;  Laterality: N/A;   TESTICLE SURGERY     Patient states that he had to have the tube fixed.    Allergies: No Known Allergies  Home Medications: Medications Prior to Admission  Medication Sig Dispense Refill Last Dose   acetaminophen (TYLENOL) 500 MG tablet Take 500-1,000 mg by mouth every 6 (six) hours as needed for mild pain, fever or headache.    Past Month   albuterol (VENTOLIN HFA) 108 (90 Base) MCG/ACT inhaler TAKE 2 PUFFS BY MOUTH EVERY 6 HOURS AS NEEDED FOR WHEEZE OR SHORTNESS OF BREATH 6.7 each 3 06/14/2022   atorvastatin (LIPITOR) 40 MG tablet TAKE 1 TABLET BY MOUTH EVERY DAY 90 tablet 0 06/14/2022   budesonide-formoterol (SYMBICORT) 160-4.5 MCG/ACT inhaler Inhale 2 puffs into the lungs in the morning and at bedtime.   06/13/2022   furosemide (LASIX) 40 MG tablet TAKE 1 TABLET BY MOUTH EVERY DAY 90 tablet 1 06/14/2022   hydrocortisone (ANUSOL-HC) 25 MG suppository One suppository twice a day as needed for hemmorhoids, (okay to substitute generic) 24 suppository 1 Past Week   irbesartan (AVAPRO) 150 MG tablet TAKE 1 TABLET (150 MG TOTAL) BY MOUTH DAILY. OFFICE VISIT NEEDED FOR ADDITIONAL REFILLS 30 tablet 0 06/14/2022   JARDIANCE 10 MG TABS tablet TAKE 1 TABLET BY MOUTH DAILY BEFORE BREAKFAST. 30 tablet 5 06/14/2022   KLOR-CON M20 20 MEQ tablet TAKE 1 TABLET BY MOUTH EVERY DAY 90 tablet 0 06/14/2022   metFORMIN (GLUCOPHAGE-XR) 500 MG 24 hr tablet TAKE 1 TABLET BY MOUTH IN THE  MORNING AND AT BEDTIME. OFFICE VISIT NEEDED FOR ADDITIONAL REFILLS 60 tablet 0 06/14/2022   metoprolol succinate (TOPROL-XL) 100 MG 24 hr tablet Take 1 tablet (100 mg total) by mouth daily. TAKE WITH OR IMMEDIATELY FOLLOWING A MEAL.PLEASE CALL OFFICE TO SCHEDULE FOLLOW  UP APPOINTMENT 15 tablet 0 06/14/2022   spironolactone (ALDACTONE) 25 MG tablet TAKE 1 TABLET (25 MG TOTAL) BY MOUTH DAILY. 30 tablet 0 06/14/2022   thiamine 100 MG tablet Take 1 tablet (100 mg total) by mouth daily. 90 tablet 1 Past Month   budesonide-formoterol (SYMBICORT) 160-4.5 MCG/ACT inhaler TAKE 2 PUFFS BY MOUTH TWICE A DAY 30.6 each 1    erythromycin ophthalmic ointment Place 1 Application into the right eye at bedtime. Apply 1/2 inch of Erythromycin to lower eyelid before bed. (Patient not taking: Reported on 06/14/2022) 3.5 g 0 Not Taking   folic acid (FOLVITE) 1 MG tablet TAKE 1 TABLET BY MOUTH EVERY DAY (Patient not taking: Reported on 06/14/2022) 90 tablet 0 Not Taking   hydroxypropyl methylcellulose / hypromellose (ISOPTO TEARS / GONIOVISC) 2.5 % ophthalmic solution  Place 1 drop into the right eye 3 (three) times daily as needed for dry eyes. (Patient not taking: Reported on 06/14/2022) 15 mL 12 Not Taking   Home medication reconciliation was completed with the patient.   Scheduled Inpatient Medications:    heparin  5,000 Units Subcutaneous Q8H   irbesartan  150 mg Oral Daily   metoprolol succinate  100 mg Oral Daily   mometasone-formoterol  2 puff Inhalation BID   ondansetron (ZOFRAN) IV  4 mg Intravenous Once   spironolactone  25 mg Oral Daily   thiamine  100 mg Oral Daily    Continuous Inpatient Infusions:    sodium chloride 10 mL/hr at 06/15/22 0420   cefTRIAXone (ROCEPHIN)  IV Stopped (06/14/22 1955)   metronidazole 500 mg (06/15/22 0558)    PRN Inpatient Medications:  sodium chloride, acetaminophen **OR** acetaminophen, albuterol, HYDROmorphone (DILAUDID) injection, LORazepam, morphine injection, nicotine,  nitroGLYCERIN, ondansetron **OR** ondansetron (ZOFRAN) IV, senna-docusate  Family History: family history includes Diabetes in his brother, father, and mother; Heart attack in his brother; Heart disease in his brother; Kidney failure in his brother.    Social History:   reports that he has been smoking pipe and cigarettes. He has a 6.25 pack-year smoking history. He has never used smokeless tobacco. He reports current alcohol use. He reports that he does not use drugs.   Review of Systems: Constitutional: Weight is stable.  Eyes: No changes in vision. ENT: No oral lesions, sore throat.  GI: see HPI.  Heme/Lymph: No easy bruising.  CV: No chest pain.  GU: No hematuria.  Integumentary: No rashes.  Neuro: No headaches.  Psych: No depression/anxiety.  Endocrine: No heat/cold intolerance.  Allergic/Immunologic: No urticaria.  Resp: No cough, SOB.  Musculoskeletal: No joint swelling.    Physical Examination: BP (!) 134/107 (BP Location: Left Arm)   Pulse 95   Temp 97.8 F (36.6 C) (Oral)   Resp 18   Ht 6' (1.829 m)   Wt 124.3 kg   SpO2 99%   BMI 37.17 kg/m  Gen: NAD, alert and oriented x 4 HEENT: PEERLA, EOMI, Neck: supple, no JVD or thyromegaly Chest: CTA bilaterally, no wheezes, crackles, or other adventitious sounds CV: RRR, no m/g/c/r Abd: soft, TTP in epigastric area, ND, +BS in all four quadrants; no HSM, guarding, ridigity, or rebound tenderness Ext: + edema Skin: no rash or lesions noted Lymph: no LAD  Data: Lab Results  Component Value Date   WBC 5.5 06/15/2022   HGB 13.6 06/15/2022   HCT 41.8 06/15/2022   MCV 87.8 06/15/2022   PLT 169 06/15/2022   Recent Labs  Lab 06/14/22 1224 06/15/22 0304  HGB 13.7 13.6   Lab Results  Component Value Date   NA 134 (L) 06/15/2022   K 4.1 06/15/2022   CL 99 06/15/2022   CO2 23 06/15/2022   BUN 21 06/15/2022   CREATININE 1.00 06/15/2022   Lab Results  Component Value Date   ALT 31 06/15/2022   AST 39  06/15/2022   ALKPHOS 68 06/15/2022   BILITOT 1.8 (H) 06/15/2022   Recent Labs  Lab 06/14/22 1224  INR 1.0   MRCP today-IMPRESSION: 1. Limited study demonstrating biliary sludge in the gallbladder. Gallbladder wall appears thickened and edematous with pericholecystic fluid. There is also fluid adjacent to the head of the pancreas and the duodenal. Whether this reflects an acute cholecystitis or secondary to underlying pancreatitis is uncertain on the basis of today's examination. 2. No definite evidence of choledocholithiasis.  No signs of biliary tract obstruction. 3. Mild hepatic steatosis. 4. Aortic atherosclerosis with suprarenal abdominal aortic aneurysm measuring up to 4.0 cm in diameter. Recommend follow-up every 12 months and vascular consultation. This recommendation follows ACR consensus guidelines: White Paper of the ACR Incidental Findings Committee II on Vascular Findings. J Am Coll Radiol 2013; 10:789-794.    [place] RUQ today-IMPRESSION: 1. Abnormal appearance of the gallbladder. There is apparent material within the gallbladder, non mobile, along the wall adjacent to the liver. This could potentially reflect focal wall edema due to adjacent liver pathology. It could reflect adherent sludge. This material contains internal cystic areas which argues against neoplasm, although neoplasm not fully excluded. Findings could be further assessed, when the patient can better tolerate the procedure, with liver MRI without and with contrast including MRCP. 2. No pericholecystic fluid, no stones and no sonographic Murphy's sign to indicate acute cholecystitis. 3. Subtle liver surface nodularity. Consider cirrhosis. Increased liver parenchymal echogenicity consistent with hepatic steatosis.   CT Angio chest/abd/pelvis-NON CTA 1. Gallbladder distended with an indistinct wall an adjacent hazy fat infiltration. Consider acute cholecystitis in the proper clinical setting, which  could be further assessed with limited right upper quadrant ultrasound. No visualized stones. 2. No other evidence of an acute abnormality within the chest, abdomen or pelvis. 3. Multiple chronic findings as detailed above stable compared to the prior CT.   Lipase normal - 27 Direct bili 0.4, Indirect 1.4 Lactic acid 2.2 CBC normal.  BNP 575.5     Assessment/Plan: Eugene Henderson is a 72 y.o. male admitted for SOB and CP.    Abd pain - Extensive workup with right upper quadrant ultrasound, CT and MRCP as above does not show evidence of choledocholithiasis.  Common bile duct normal 4 mm on MRCP.  LFTS fairly normal with very minimal direct bili elevation. Appears to have some potential sludge in the gallbladder, gallbladder wall appears thickened and this may be due to underlying liver disease.  Given normal lipase do not feel this represents pancreatitis.  He does have daily NSAID use as well as frequent alcohol and tobacco use and will start PPI as endorses some epigastric burning and likely has underlying gastritis/PUD. Hx of HCV - treated in 2018, had SVR 2023. Hepatic steatosis - 2018 elastography with mildly nodular hepatic contour, continues to be seen on above imaging. Will need full outpatient evaluation for cirrhosis. Long hx of daily alcohol use, reports he decreased to 12 beers/week currently, will need to discuss further at f/up. Platelet/albumin/INR all normal.  SOB/CP - defer to primary team COPD CHF-BNP elevated to 575 Elevated lactic acid - increased to 2.2 on AM labs, monitor, given no choledocholithiasis would consider evaluation for other etiologies.  Obesity - BMI 37  Recommendations: Start PPI IV BID, will also add in viscous lidocaine for symptom relief Avoid nsaids/alcohol, etc Supportive care per primary team  Can monitor LFTS but most of current bili elevation is indirect. Transaminases normal.  Will follow up as outpatient for long term hepatology care and  evaluation of liver nodularity and potential cirrhosis.  Case was discussed with Dr. Timothy Lasso. Thank you for the consult. Please call with questions or concerns.  Ardelia Mems, PA-C [facility]

## 2022-06-15 NOTE — Progress Notes (Signed)
Mobility Specialist - Progress Note   06/15/22 0918  Mobility  Activity Transferred from bed to chair  Level of Assistance Modified independent, requires aide device or extra time  Assistive Device None  Distance Ambulated (ft) 4 ft  Activity Response Tolerated well  Mobility Referral Yes  $Mobility charge 1 Mobility   MS responding to bed alarm. Pt impulsively transferring from bed to recliner upon entry, utilizing RA. Pt had one LOB, self-corrected by grabbing onto railing. Pt left sitting in recliner with alarm set, needs within reach and Pt educated on how to use the call bell.   Candie Mile Mobility Specialist 06/15/22 9:22 AM

## 2022-06-15 NOTE — Consult Note (Signed)
Hospital Consult    Reason for Consult:  Aortic and Ileac Aneurysm Requesting Physician:  Dr Ermalene Searing MD MRN #:  [id number]  History of Present Illness: This is a 72 y.o. male with a past medical history significant for hypertension, hyperlipidemia, non-insulin-dependent diabetes.  The patient presented to the emergency room on 06/14/2022 with chest pain and shortness of breath x 3 days.  Patient also complained of severe abdominal pain.  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  The rest of the findings were negative for gallbladder disease.   Upon examination today I find the patient sitting in a bedside chair resting comfortably.  Patient states he is in 4 out of 10 pain consistently and when he has spasms pain shoots to 10 out of 10.  Isolates the pain to be in his left lower abdomen/groin.  Patient states when it is really bad he can actually urinate uncontrollably.  He states he was able to rest last night.  There were no complications to note.  Vitals remained stable.  Past Medical History:  Diagnosis Date   Arthritis    Ascending aortic aneurysm ([place])    a. 09/2017 Stable TAA - 5.1cm.   Asthma    Chronic systolic CHF (congestive heart failure) ([place])    a. EF 25-30% by echo in 07/2016 with cath showing no significant CAD b. 01/2017: EF 30-35% with diffuse HK and moderate MR; c. 07/2017 Echo: EF 30-35%, diff hK. Mild MR, mildly dil LA. PASP 39mmHg.   Diverticulitis    Diverticulitis of large intestine with perforation without abscess or bleeding 05/13/2017   Hypertension    Hyperthyroidism    NICM (nonischemic cardiomyopathy) ([place])    Noncompliance    Persistent atrial fibrillation ([facility])    a. CHA2DS2VASc = 3-->Eliquis (? compliance).    Past Surgical History:  Procedure Laterality Date   COLONOSCOPY N/A 08/27/2019   Procedure: COLONOSCOPY;  Surgeon: Toledo, Benay Pike, MD;   Location: [facility] ENDOSCOPY;  Service: Gastroenterology;  Laterality: N/A;   JOINT REPLACEMENT Right    RIGHT/LEFT HEART CATH AND CORONARY ANGIOGRAPHY N/A 08/02/2016   Procedure: Right/Left Heart Cath and Coronary Angiography;  Surgeon: Wellington Hampshire, MD;  Location: [facility];  Service: Cardiovascular;  Laterality: N/A;   TESTICLE SURGERY     Patient states that he had to have the tube fixed.    No Known Allergies  Prior to Admission medications   Medication Sig Start Date End Date Taking? Authorizing Provider  acetaminophen (TYLENOL) 500 MG tablet Take 500-1,000 mg by mouth every 6 (six) hours as needed for mild pain, fever or headache.    Yes [provider]  albuterol (VENTOLIN HFA) 108 (90 Base) MCG/ACT inhaler TAKE 2 PUFFS BY MOUTH EVERY 6 HOURS AS NEEDED FOR WHEEZE OR SHORTNESS OF BREATH 10/26/21  Yes Murton, Megan P, DO  atorvastatin (LIPITOR) 40 MG tablet TAKE 1 TABLET BY MOUTH EVERY DAY 05/28/22  Yes Goldin, Megan P, DO  budesonide-formoterol (SYMBICORT) 160-4.5 MCG/ACT inhaler Inhale 2 puffs into the lungs in the morning and at bedtime.   Yes [provider]  furosemide (LASIX) 40 MG tablet TAKE 1 TABLET BY MOUTH EVERY DAY 03/01/22  Yes Mcguinn, Megan P, DO  hydrocortisone (ANUSOL-HC) 25 MG suppository One suppository twice a day as needed for hemmorhoids, (okay to substitute generic) 10/30/20  Yes Swingle, Megan P, DO  irbesartan (AVAPRO) 150  MG tablet TAKE 1 TABLET (150 MG TOTAL) BY MOUTH DAILY. OFFICE VISIT NEEDED FOR ADDITIONAL REFILLS 06/14/22  Yes Misenheimer, Megan P, DO  JARDIANCE 10 MG TABS tablet TAKE 1 TABLET BY MOUTH DAILY BEFORE BREAKFAST. 05/04/22  Yes Iran Ouch, MD  KLOR-CON M20 20 MEQ tablet TAKE 1 TABLET BY MOUTH EVERY DAY 05/28/22  Yes Episcopo, Megan P, DO  metFORMIN (GLUCOPHAGE-XR) 500 MG 24 hr tablet TAKE 1 TABLET BY MOUTH IN THE MORNING AND AT BEDTIME. OFFICE VISIT NEEDED FOR ADDITIONAL REFILLS 06/14/22  Yes Salva, Megan P, DO   metoprolol succinate (TOPROL-XL) 100 MG 24 hr tablet Take 1 tablet (100 mg total) by mouth daily. TAKE WITH OR IMMEDIATELY FOLLOWING A MEAL.PLEASE CALL OFFICE TO SCHEDULE FOLLOW  UP APPOINTMENT 06/19/21  Yes Agbor-Etang, Arlys John, MD  spironolactone (ALDACTONE) 25 MG tablet TAKE 1 TABLET (25 MG TOTAL) BY MOUTH DAILY. 02/16/22  Yes Maines, Megan P, DO  thiamine 100 MG tablet Take 1 tablet (100 mg total) by mouth daily. 10/30/20  Yes Fickling, Megan P, DO  budesonide-formoterol (SYMBICORT) 160-4.5 MCG/ACT inhaler TAKE 2 PUFFS BY MOUTH TWICE A DAY 12/18/21   Shinsky, Megan P, DO  erythromycin ophthalmic ointment Place 1 Application into the right eye at bedtime. Apply 1/2 inch of Erythromycin to lower eyelid before bed. Patient not taking: Reported on 06/14/2022 05/24/22   Orvil Feil, PA-C  folic acid (FOLVITE) 1 MG tablet TAKE 1 TABLET BY MOUTH EVERY DAY Patient not taking: Reported on 06/14/2022 09/24/21   Olevia Perches P, DO  hydroxypropyl methylcellulose / hypromellose (ISOPTO TEARS / GONIOVISC) 2.5 % ophthalmic solution Place 1 drop into the right eye 3 (three) times daily as needed for dry eyes. Patient not taking: Reported on 06/14/2022 05/24/22   Orvil Feil, PA-C    Social History   Socioeconomic History   Marital status: Single    Spouse name: Not on file   Number of children: Not on file   Years of education: Not on file   Highest education level: Not on file  Occupational History   Occupation: retired  Tobacco Use   Smoking status: Some Days    Packs/day: 0.25    Years: 25.00    Total pack years: 6.25    Types: Pipe, Cigarettes   Smokeless tobacco: Never   Tobacco comments:    pipe tobacco   Vaping Use   Vaping Use: Never used  Substance and Sexual Activity   Alcohol use: Yes    Comment: Socially - once a month   Drug use: No   Sexual activity: Not Currently  Other Topics Concern   Not on file  Social History Narrative   Not on file   Social Determinants of Health    Financial Resource Strain: Low Risk  (07/11/2020)   Overall Financial Resource Strain (CARDIA)    Difficulty of Paying Living Expenses: Not hard at all  Food Insecurity: No Food Insecurity (06/15/2022)   Hunger Vital Sign    Worried About Running Out of Food in the Last Year: Never true    Ran Out of Food in the Last Year: Never true  Transportation Needs: No Transportation Needs (06/15/2022)   PRAPARE - Administrator, Civil Service (Medical): No    Lack of Transportation (Non-Medical): No  Physical Activity: Insufficiently Active (07/11/2020)   Exercise Vital Sign    Days of Exercise per Week: 2 days    Minutes of Exercise per Session: 20 min  Stress: No  Stress Concern Present (07/11/2020)   [employer] of Occupational Health - Occupational Stress Questionnaire    Feeling of Stress : Not at all  Social Connections: Not on file  Intimate Partner Violence: Not At Risk (06/15/2022)   Humiliation, Afraid, Rape, and Kick questionnaire    Fear of Current or Ex-Partner: No    Emotionally Abused: No    Physically Abused: No    Sexually Abused: No     Family History  Problem Relation Age of Onset   Diabetes Mother    Diabetes Father    Heart disease Brother    Heart attack Brother    Diabetes Brother    Kidney failure Brother    Thyroid disease Neg Hx     ROS: Otherwise negative unless mentioned in HPI  Physical Examination  Vitals:   06/15/22 0900 06/15/22 1507  BP: (!) 134/107 (!) 117/91  Pulse: 95 89  Resp: 18 20  Temp: 97.8 F (36.6 C) (!) 97.5 F (36.4 C)  SpO2: 99% 97%   Body mass index is 37.17 kg/m.  General:  WDWN in NAD Gait: Not observed HENT: WNL, normocephalic Pulmonary: normal non-labored breathing, without Rales, rhonchi,  wheezing Cardiac: irregular, without  Murmurs, rubs or gallops; without carotid bruits Abdomen: Positive Bowel Sounds soft, NT/ND, no masses Skin: without rashes Vascular Exam/Pulses: Bilateral lower extremity pulses  Post tibial are +2 on the right and +1 and weak on the left.  Extremities: without ischemic changes, without Gangrene , without cellulitis; without open wounds;  Musculoskeletal: no muscle wasting or atrophy  Neurologic: A&O X 3;  No focal weakness or paresthesias are detected; speech is fluent/normal Psychiatric:  The pt has Normal affect. Lymph:  Unremarkable  CBC    Component Value Date/Time   WBC 5.5 06/15/2022 0304   RBC 4.76 06/15/2022 0304   HGB 13.6 06/15/2022 0304   HGB 14.3 06/18/2021 1409   HCT 41.8 06/15/2022 0304   HCT 42.1 06/18/2021 1409   PLT 169 06/15/2022 0304   PLT 154 06/18/2021 1409   MCV 87.8 06/15/2022 0304   MCV 86 06/18/2021 1409   MCH 28.6 06/15/2022 0304   MCHC 32.5 06/15/2022 0304   RDW 14.8 06/15/2022 0304   RDW 15.1 06/18/2021 1409   LYMPHSABS 1.3 06/18/2021 1409   MONOABS 0.7 05/14/2017 0351   EOSABS 0.1 06/18/2021 1409   BASOSABS 0.0 06/18/2021 1409    BMET    Component Value Date/Time   NA 134 (L) 06/15/2022 0304   NA 139 06/18/2021 1409   K 4.1 06/15/2022 0304   CL 99 06/15/2022 0304   CO2 23 06/15/2022 0304   GLUCOSE 134 (H) 06/15/2022 0304   BUN 21 06/15/2022 0304   BUN 13 06/18/2021 1409   CREATININE 1.00 06/15/2022 0304   CALCIUM 9.0 06/15/2022 0304   GFRNONAA >60 06/15/2022 0304   GFRAA 96 03/28/2020 1524    COAGS: Lab Results  Component Value Date   INR 1.0 06/14/2022   INR 1.2 08/25/2019   INR 1.1 08/24/2019     Non-Invasive Vascular Imaging:   CTA ABDOMEN AND PELVIS FINDINGS   VASCULAR   Aorta: Focal fusiform dilation of the proximal aorta, at and just below the level of the renal arteries, maximum diameter of 3.8 cm, associated with eccentric atherosclerotic plaque and mural thrombus, unchanged compared to the prior CT. Moderate atherosclerotic plaque, partly calcified. No dissection. Infrarenal portion dilated to 3 cm.  Inflow: Minimally opacified common and proximal external iliac vessels. More distally  these are not opacified. 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 This is similar to the prior CT. Plaque and narrowing of these vessels cannot be accurately assessed due to limited contrast enhancement. No convincing change from the prior CT, however.  Statin:  No. Beta Blocker:  Yes.   Aspirin:  No. ACEI:  No. ARB:  Yes.   CCB use:  No Other antiplatelets/anticoagulants:  No.    ASSESSMENT/PLAN: This is a 72 y.o. male who presented to [facility] for chest pain and shortness of breath x 3 days.  Is also noted to have abdominal pain.  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  Vascular surgery was consulted for evaluation and treatment.  Plan: Patient will need repair of the internal iliac artery aneurysm as soon as possible.  Patient will need coiling and stenting of the left and right iliac arteries.  At the same time patient's aortic aneurysm at 4 cm will be stented as well.  The patient will need general anesthesia for this procedure.  Therefore we asked primary team to consult cardiology to get clearance.  If granted cardiac clearance we will plan on scheduling the patient for the procedure on 06/16/2022.    I discussed the procedure, benefits, risks, and complications of the procedure with the patient.  I drew him pictures of where the stents would be placed in his aorta and iliac arteries.  I answered all the patient's questions.  Patient verbalizes his understanding.  Patient wishes to proceed with the procedure.  Patient understands we need cardiac clearance before proceeding.      Marcie Bal [facility] 06/15/2022 4:31 PM

## 2022-06-15 NOTE — Progress Notes (Addendum)
PROGRESS NOTE  Eugene Henderson    DOB: 09/06/1950, 72 y.o.  [id number]    Code Status: Full Code   DOA: 06/14/2022   LOS: 1   Brief hospital course  Eugene Henderson is a 72 y.o. male with a PMH significant for hypertension, hyperlipidemia, non-insulin-dependent diabetes mellitus.  They presented from home to the ED on 06/14/2022 with chest pain and shortness of breath x 3 days. Current smoker.  In the ED, it was found that they had temperature of 97.9, respiration rate of 20, heart rate of 126, blood pressure 145/96, SpO2 of 97% on room air.  Significant findings included Serum sodium is 133, potassium 3.7, chloride 101, bicarb 21, BUN of 14, serum creatinine of 0.76, EGFR greater than 60, nonfasting blood glucose 113, WBC 5.4, hemoglobin 13.7, platelets of 159. BNP was elevated at 575.5. High sensitive troponin was 25 and on repeat was 28. COVID/influenza A/influenza B/RSV PCR were negative.  They were initially treated with Maalox PO, aspirin 324 mg p.o. one-time dose, furosemide 40 mg IV, Dilaudid 0.5 mg IV, metoprolol 5 mg IV x 2, sublingual nitroglycerin 0.4 mg one-time dose ordered.   Patient was admitted to medicine service for further workup and management of abdominal pain as outlined in detail below.  06/15/22 -improved  Assessment & Plan  Principal Problem:   Abdominal pain Active Problems:   Persistent atrial fibrillation (HCC)   Essential hypertension   Tobacco use   Diabetes mellitus without complication (HCC)   Morbid obesity (HCC)   AF (paroxysmal atrial fibrillation) (HCC)   Hyperlipidemia   Metal foreign body in head   Elevated bilirubin  Abdominal pain- negative workup for choledocholithiasis. 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 Abdomen CTA revealed several abnormalities 1/8 - vascular surgery consulted. Planning graft/stent - will need cardiac optimization first   Metal foreign body in head - MRCP not ordered on admission due to multiple metallic shrapnel remnants - AM team to call radiology to determine if MRCP would be safe   Hyperlipidemia - Atorvastatin resumed on admission due to elevated bili   Morbid obesity ([place]) - This meets criteria for morbid obesity based on the presence of 1 or more chronic comorbidities. Patient has hypertension. This complicates overall care and prognosis.    Essential hypertension - Irbesartan 150 mg daily, metoprolol succinate 100 mg daily, spironolactone 25 mg daily resumed   Persistent atrial fibrillation (HCC) - Resumed home metoprolol succinate 100 mg daily  Body mass index is 37.17 kg/m.  VTE ppx: heparin injection 5,000 Units Start: 06/14/22 2200 Place TED hose Start: 06/14/22 1835   Diet:     Diet   Diet NPO time specified Except for: Sips with Meds   Consultants: GI Cardiology Vascular surgery  Subjective 06/15/22    Pt reports feeling improved s/p pain medication. Continues to have shortness of breath and RUQ/epigastric pain. Overall improved since presentation.   Objective   Vitals:   06/14/22 2100 06/14/22 2234 06/15/22 0005 06/15/22 0238  BP: (!) 128/101  (!) 131/107 (!) 137/108  Pulse: 94  95 (!) 109  Resp: 19  18 18  Temp:  97.6 F (36.4 C) 97.8 F (36.6 C) 97.8 F (36.6 C)  TempSrc:  Oral Oral Oral  SpO2: 95%  98% 99%  Weight:    124.3 kg  Height:    6' (1.829 m)    Intake/Output Summary (Last 24 hours) at 06/15/2022 0709 Last data filed at 06/15/2022 0301 Gross per 24 hour  Intake 185.56 ml  Output 425 ml  Net -239.44 ml   Filed Weights   06/14/22 1222  06/15/22 0238  Weight: 122.5 kg 124.3 kg     Physical Exam:  General: awake, alert, NAD HEENT: atraumatic, clear conjunctiva, anicteric sclera, MMM, hearing grossly normal Respiratory: normal respiratory effort. Cardiovascular: quick capillary refill, normal S1/S2, RRR, no JVD, murmurs Gastrointestinal: soft, acutely tender to RUQ and epigastric and periumbilical area Nervous: A&O x3. no gross focal neurologic deficits, speech difficult to understand Extremities: moves all equally, 2+ pitting edema LE Skin: dry, intact, normal temperature, normal color. No rashes, lesions or ulcers on exposed skin Psychiatry: normal mood, congruent affect  Labs   I have personally reviewed the following labs and imaging studies CBC    Component Value Date/Time   WBC 5.5 06/15/2022 0304   RBC 4.76 06/15/2022 0304   HGB 13.6 06/15/2022 0304   HGB 14.3 06/18/2021 1409   HCT 41.8 06/15/2022 0304   HCT 42.1 06/18/2021 1409   PLT 169 06/15/2022 0304   PLT 154 06/18/2021 1409   MCV 87.8 06/15/2022 0304   MCV 86 06/18/2021 1409   MCH 28.6 06/15/2022 0304   MCHC 32.5 06/15/2022 0304   RDW 14.8 06/15/2022 0304   RDW 15.1 06/18/2021 1409   LYMPHSABS 1.3 06/18/2021 1409   MONOABS 0.7 05/14/2017 0351   EOSABS 0.1 06/18/2021 1409   BASOSABS 0.0 06/18/2021 1409      Latest Ref Rng & Units 06/15/2022    3:04 AM 06/14/2022   12:24 PM 06/18/2021    2:09 PM  BMP  Glucose 70 - 99 mg/dL 672  094  709   BUN 8 - 23 mg/dL 21  14  13    Creatinine 0.61 - 1.24 mg/dL  6.28  3.66   BUN/Creat Ratio 10 - 24   17   Sodium 135 - 145 mmol/L 134  133  139   Potassium 3.5 - 5.1 mmol/L 4.1  3.7  4.2   Chloride 98 - 111 mmol/L 99  101  101   CO2 22 - 32 mmol/L 23  21  22    Calcium 8.9 - 10.3 mg/dL 9.0  9.1  8.8     MR ABDOMEN MRCP WO CONTRAST  Result Date: 06/15/2022 CLINICAL DATA:  72 year old male with history of right upper quadrant abdominal pain. Suspected biliary disease. EXAM: MRI ABDOMEN WITHOUT CONTRAST   (INCLUDING MRCP) TECHNIQUE: Multiplanar multisequence MR imaging of the abdomen was performed. Heavily T2-weighted images of the biliary and pancreatic ducts were obtained, and three-dimensional MRCP images were rendered by post processing. COMPARISON:  No priors. FINDINGS: Comment: Today's study is limited by artifact from substantial patient respiratory motion. Additionally, this examination is limited for detection and characterization of visceral and/or vascular lesions by lack of IV gadolinium. Lower chest: Cardiomegaly. Hepatobiliary: Mild diffuse loss of signal intensity throughout the hepatic parenchyma on out of phase dual echo images, indicative of a background of mild hepatic steatosis. In the superior aspect of segment 8 of the liver there is a 11 mm T1 hypointense, T2 hyperintense lesion which is incompletely characterized  in the absence of IV gadolinium, but statistically likely a cyst. No other definite suspicious hepatic lesions are noted. MRCP images are nearly nondiagnostic, but demonstrate no intra or extrahepatic biliary ductal dilatation. Common bile duct measures 4 mm in the porta hepatis. No definite filling defects within the common bile duct to suggest choledocholithiasis. There is some amorphous material that is slightly high T1 signal intensity and low T2 signal intensity lying dependently in the gallbladder, likely biliary sludge. Gallbladder wall appears likely mildly thickened and edematous, with trace volume of pericholecystic fluid (poorly evaluated given motion limitations of today's examination). Pancreas: No definite pancreatic mass identified on today's noncontrast examination. No pancreatic ductal dilatation. Small amount of T2 hyperintensity surrounding the head and uncinate process of the pancreas, as well as the adjacent duodenum. Body and tail of the pancreas are grossly unremarkable in appearance. Spleen:  Unremarkable. Adrenals/Urinary Tract: Bilateral kidneys and adrenal  glands are unremarkable in appearance. No hydroureteronephrosis in the visualized portions of the abdomen. Stomach/Bowel: Increased T2 signal intensity surrounding portions of the duodenum. Visualized portions of stomach, small bowel and colon are otherwise unremarkable. Vascular/Lymphatic: Aortic atherosclerosis with aneurysmal dilatation of the suprarenal abdominal aorta which measures up to 4 cm in diameter at the level of the celiac axis origin. No definite lymphadenopathy noted in the abdomen. Other: Small amount of T2 hyperintensity adjacent to the head of the pancreas extending upwards into the hepatoduodenal ligament, and extending slightly caudally in the adjacent portions of the retroperitoneum. No substantial volume of ascites. Musculoskeletal: Visualized portions are grossly unremarkable. IMPRESSION: 1. Limited study demonstrating biliary sludge in the gallbladder. Gallbladder wall appears thickened and edematous with pericholecystic fluid. There is also fluid adjacent to the head of the pancreas and the duodenal. Whether this reflects an acute cholecystitis or secondary to underlying pancreatitis is uncertain on the basis of today's examination. 2. No definite evidence of choledocholithiasis. No signs of biliary tract obstruction. 3. Mild hepatic steatosis. 4. Aortic atherosclerosis with suprarenal abdominal aortic aneurysm measuring up to 4.0 cm in diameter. Recommend follow-up every 12 months and vascular consultation. This recommendation follows ACR consensus guidelines: White Paper of the ACR Incidental Findings Committee II on Vascular Findings. J Am Coll Radiol 2013; 10:789-794. Electronically Signed   By: Vinnie Langton M.D.   On: 06/15/2022 05:26   MR 3D Recon At Scanner  Result Date: 06/15/2022 CLINICAL DATA:  72 year old male with history of right upper quadrant abdominal pain. Suspected biliary disease. EXAM: MRI ABDOMEN WITHOUT CONTRAST  (INCLUDING MRCP) TECHNIQUE: Multiplanar  multisequence MR imaging of the abdomen was performed. Heavily T2-weighted images of the biliary and pancreatic ducts were obtained, and three-dimensional MRCP images were rendered by post processing. COMPARISON:  No priors. FINDINGS: Comment: Today's study is limited by artifact from substantial patient respiratory motion. Additionally, this examination is limited for detection and characterization of visceral and/or vascular lesions by lack of IV gadolinium. Lower chest: Cardiomegaly. Hepatobiliary: Mild diffuse loss of signal intensity throughout the hepatic parenchyma on out of phase dual echo images, indicative of a background of mild hepatic steatosis. In the superior aspect of segment 8 of the liver there is a 11 mm T1 hypointense, T2 hyperintense lesion which is incompletely characterized in the absence of IV gadolinium, but statistically likely a cyst. No other definite suspicious hepatic lesions are noted. MRCP images are nearly nondiagnostic, but demonstrate no intra or extrahepatic biliary ductal dilatation. Common bile duct measures 4 mm in the porta hepatis. No definite filling defects within the  common bile duct to suggest choledocholithiasis. There is some amorphous material that is slightly high T1 signal intensity and low T2 signal intensity lying dependently in the gallbladder, likely biliary sludge. Gallbladder wall appears likely mildly thickened and edematous, with trace volume of pericholecystic fluid (poorly evaluated given motion limitations of today's examination). Pancreas: No definite pancreatic mass identified on today's noncontrast examination. No pancreatic ductal dilatation. Small amount of T2 hyperintensity surrounding the head and uncinate process of the pancreas, as well as the adjacent duodenum. Body and tail of the pancreas are grossly unremarkable in appearance. Spleen:  Unremarkable. Adrenals/Urinary Tract: Bilateral kidneys and adrenal glands are unremarkable in appearance. No  hydroureteronephrosis in the visualized portions of the abdomen. Stomach/Bowel: Increased T2 signal intensity surrounding portions of the duodenum. Visualized portions of stomach, small bowel and colon are otherwise unremarkable. Vascular/Lymphatic: Aortic atherosclerosis with aneurysmal dilatation of the suprarenal abdominal aorta which measures up to 4 cm in diameter at the level of the celiac axis origin. No definite lymphadenopathy noted in the abdomen. Other: Small amount of T2 hyperintensity adjacent to the head of the pancreas extending upwards into the hepatoduodenal ligament, and extending slightly caudally in the adjacent portions of the retroperitoneum. No substantial volume of ascites. Musculoskeletal: Visualized portions are grossly unremarkable. IMPRESSION: 1. Limited study demonstrating biliary sludge in the gallbladder. Gallbladder wall appears thickened and edematous with pericholecystic fluid. There is also fluid adjacent to the head of the pancreas and the duodenal. Whether this reflects an acute cholecystitis or secondary to underlying pancreatitis is uncertain on the basis of today's examination. 2. No definite evidence of choledocholithiasis. No signs of biliary tract obstruction. 3. Mild hepatic steatosis. 4. Aortic atherosclerosis with suprarenal abdominal aortic aneurysm measuring up to 4.0 cm in diameter. Recommend follow-up every 12 months and vascular consultation. This recommendation follows ACR consensus guidelines: White Paper of the ACR Incidental Findings Committee II on Vascular Findings. J Am Coll Radiol 2013; 10:789-794. Electronically Signed   By: Vinnie Langton M.D.   On: 06/15/2022 05:26   DG Cervical Spine 2 or 3 views  Result Date: 06/14/2022 CLINICAL DATA:  MRI clearance EXAM: CERVICAL SPINE - 2-3 VIEW COMPARISON:  None Available. FINDINGS: There are multiple metallic shrapnel remnants noted abutting the right occipital bone measuring up to 10 mm in greatest dimension.  Visualized calvarium is intact. Mild degenerative changes are seen within the cervical spine. IMPRESSION: 1. Multiple metallic shrapnel remnants abutting the right occipital bone. Electronically Signed   By: Fidela Salisbury M.D.   On: 06/14/2022 22:32   US Abdomen Limited RUQ (LIVER/GB)  Result Date: 06/14/2022 CLINICAL DATA:  Follow-up possible abnormal gallbladder noted on a current CTA chest, abdomen and pelvis. Patient reportedly with chest pain, belching and left-sided abdominal pain. EXAM: ULTRASOUND ABDOMEN LIMITED RIGHT UPPER QUADRANT COMPARISON:  None Available. FINDINGS: Gallbladder: Heterogeneous echogenicity material lies along the wall adjacent to the liver, overall measuring approximately 4 x 1.2 x 2.5 cm. Wall appears thickened, up to 8 mm, although some of this measurement may be including pericholecystic fat. No pericholecystic fluid. No shadowing stone. No sonographic Murphy's sign. Common bile duct: Diameter: 2 mm Liver: Slight nodular contour. Mild increased parenchymal echogenicity. No mass. Portal vein is patent on color Doppler imaging with normal direction of blood flow towards the liver. Other: None. IMPRESSION: 1. Abnormal appearance of the gallbladder. There is apparent material within the gallbladder, non mobile, along the wall adjacent to the liver. This could potentially reflect focal wall edema due to adjacent  liver pathology. It could reflect adherent sludge. This material contains internal cystic areas which argues against neoplasm, although neoplasm not fully excluded. Findings could be further assessed, when the patient can better tolerate the procedure, with liver MRI without and with contrast including MRCP. 2. No pericholecystic fluid, no stones and no sonographic Murphy's sign to indicate acute cholecystitis. 3. Subtle liver surface nodularity. Consider cirrhosis. Increased liver parenchymal echogenicity consistent with hepatic steatosis. Electronically Signed   By: Lajean Manes M.D.   On: 06/14/2022 17:35   CT Angio Chest/Abd/Pel for Dissection W and/or Wo Contrast  Result Date: 06/14/2022 CLINICAL DATA:  Chest pain. Loss of belching. Left-sided abdominal pain. Concern for acute aortic syndrome. EXAM: CT ANGIOGRAPHY CHEST, ABDOMEN AND PELVIS TECHNIQUE: Non-contrast CT of the chest was initially obtained. Multidetector CT imaging through the chest, abdomen and pelvis was performed using the standard protocol during bolus administration of intravenous contrast. Multiplanar reconstructed images and MIPs were obtained and reviewed to evaluate the vascular anatomy. RADIATION DOSE REDUCTION: This exam was performed according to the departmental dose-optimization program which includes automated exposure control, adjustment of the mA and/or kV according to patient size and/or use of iterative reconstruction technique. CONTRAST:  163mL OMNIPAQUE IOHEXOL 350 MG/ML SOLN COMPARISON:  08/04/2021. FINDINGS: CTA CHEST FINDINGS Cardiovascular: Mild-to-moderate cardiac enlargement. Trace pericardial effusion. Three-vessel coronary artery calcifications. Ascending thoracic aorta measures 4.7 cm in diameter. No aortic dissection. Mild aortic atherosclerosis mostly along the mid to lower descending portion. Arch branch vessels are widely patent. Mediastinum/Nodes: No neck base, mediastinal or hilar masses. Scattered prominent lymph nodes. Ascus level right paratracheal node, 1.1 cm short axis. Subcarinal node, 1.5 cm short axis. Trachea and esophagus are unremarkable. Lungs/Pleura: Mild centrilobular emphysema. Minor linear subsegmental atelectasis, dependent right middle lobe, base of the left upper lobe lingula. No evidence of pneumonia or pulmonary edema. No pleural effusion or pneumothorax. Musculoskeletal: No fracture or acute finding. No chest wall mass. Bilateral gynecomastia. Review of the MIP images confirms the above findings. CTA ABDOMEN AND PELVIS FINDINGS VASCULAR Aorta: Focal fusiform  dilation of the proximal aorta, at and just below the level of the renal arteries, maximum diameter of 3.8 cm, associated with eccentric atherosclerotic plaque and mural thrombus, unchanged compared to the prior CT. Moderate atherosclerotic plaque, partly calcified. No dissection. Infrarenal portion dilated to 3 cm. Celiac: Plaque at the origin. No significant stenosis. No aneurysm or dissection. SMA: Mild plaque at the origin. No significant stenosis. No aneurysm or dissection. Renals: Both renal arteries are patent without evidence of aneurysm, dissection, vasculitis, fibromuscular dysplasia or significant stenosis. IMA: Faintly identified, small but patent IMA. Inflow: Minimally opacified common and proximal external iliac vessels. More distally these are not opacified. 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 This is similar to the prior CT. Plaque and narrowing of these vessels cannot be accurately assessed due to limited contrast enhancement. No convincing change from the prior CT, however. Veins: No obvious venous abnormality within the limitations of this arterial phase study. Review of the MIP images confirms the above findings. NON-VASCULAR Hepatobiliary: Overall relative decreased liver attenuation. Subtle nodularity suggested, assessment somewhat limited by patient motion. Liver normal in overall size. No defined mass. Gallbladder margins are indistinct. Haziness in the adjacent fat extending to abut the first and second portions of the duodenum. No defined stone. No convincing duct dilation. Pancreas: Partial fatty replacement of the pancreas. No mass or duct dilation. No convincing inflammation. Spleen: Normal in size without focal abnormality. Adrenals/Urinary Tract:  No adrenal masses. No renal masses, stones or hydronephrosis. Normal ureters. Bladder unremarkable. Stomach/Bowel: Normal stomach. Small bowel and colon are normal in caliber. No wall thickening. No inflammation. Scattered  colonic diverticula. Lymphatic: No enlarged lymph nodes. Reproductive: Unremarkable. Other: Anterior hernia mesh. Minimal fat containing umbilical hernia. Appear stable from the prior CT. No ascites. Musculoskeletal: No fracture or acute finding. No bone lesion. Degenerative changes throughout the lumbar spine. Advanced arthropathic changes of the left hip. Review of the MIP images confirms the above findings. IMPRESSION: CTA 1. No acute findings. No aortic dissection, intramural hematoma or convincing penetrating ulcer. No change when compared to the CT from 08/04/2021. 2. Dilated ascending thoracic aorta, prominent descending portion, also stable. Current maximum dimension of the ascending portion, 4.9 cm at the sino-tubular junction, 4.8 cm of the ascending portion at the level of the main pulmonary artery. Ascending thoracic aortic aneurysm. Recommend semi-annual imaging followup by CTA or MRA and referral to cardiothoracic surgery if not already obtained. This recommendation follows 2010 ACCF/AHA/AATS/ACR/ASA/SCA/SCAI/SIR/STS/SVM Guidelines for the Diagnosis and Management of Patients With Thoracic Aortic Disease. Circulation. 2010; [id number]. Aortic aneurysm NOS (ICD10-I71.9) 3. Focal dilation of the proximal abdominal aorta to 3.8 cm associated with eccentric mural thrombus, unchanged from the prior CT. Infrarenal portion dilated two 3 cm. 4. 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 NON CTA 1. Gallbladder distended with an indistinct wall an adjacent hazy fat infiltration. Consider acute cholecystitis in the proper clinical setting, which could be further assessed with limited right upper quadrant ultrasound. No visualized stones. 2. No other evidence of an acute abnormality within the chest, abdomen or pelvis. 3. Multiple chronic findings as detailed above stable compared to the prior CT. Electronically Signed   By: Lajean Manes M.D.   On: 06/14/2022 16:09   DG Chest 2 View  Result Date:  06/14/2022 CLINICAL DATA:  Chest pain and shortness of breath EXAM: CHEST - 2 VIEW COMPARISON:  CTA chest dated 08/04/2021, chest radiograph dated 08/24/2019 FINDINGS: Normal lung volumes. No focal consolidations. No pleural effusion or pneumothorax. Enlarged cardiomediastinal silhouette. The visualized skeletal structures are unremarkable. IMPRESSION: 1. No active cardiopulmonary disease. 2. Cardiomegaly. Electronically Signed   By: Darrin Nipper M.D.   On: 06/14/2022 13:17    Disposition Plan & Communication  Patient status: Inpatient  Admitted From: Home Planned disposition location: Home Anticipated discharge date: 1/13 pending vascular procedure and treatment of HF  Family Communication: none    Author: Richarda Osmond, DO [facility] 06/15/2022, 7:09 AM   Available by Epic secure chat 7AM-7PM. If 7PM-7AM, please contact night-coverage.  [facility] contact information found on [email or web address].

## 2022-06-15 NOTE — Consult Note (Signed)
Cardiology Consultation:   Patient ID: Eugene Henderson; [id number]; 01/11/1951   Admit date: 06/14/2022 Date of Consult: 06/15/2022  Primary Care Provider: Dorcas CarrowJohnson, Megan P, DO Primary Cardiologist: Kirke CorinArida Primary Electrophysiologist:  None   Patient Profile:   Eugene Henderson is a 72 y.o. male with a hx of HFrEF secondary to NICM, longstanding persistent A-fib, noncompliance, AAA, ascending aortic aneurysm/dilated aortic root, iliac artery aneurysm, GI bleed in 2021 with apixaban being held at that time, diabetes, hyperthyroidism, hepatitis C, alcohol use, ongoing tobacco use, and HTN  who is being seen today for preprocedure cardiac risk stratification at the request of Dr. Dareen PianoAnderson.  History of Present Illness:   Eugene Henderson was admitted to the hospital in 2018 with acute HFrEF and new onset A-fib/flutter.  Echo in 2018 demonstrated an EF of 25 to 30%, diffuse hypokinesis, aortic root measuring 15 mm, moderate mitral regurgitation, severe biatrial enlargement, and an estimated PASP of 50 mmHg.  R/LHC in 07/2016 showed no significant CAD with RHC showing minimally elevated filling pressures and pulmonary hypertension with moderately reduced cardiac output.  Follow-up echo in 01/2017 showed improvement in LV systolic function with an EF of 35%.  In the setting of limited medical adherence he has been at rate controlled.    He was admitted to the hospital in 2019 with GI bleeding with colonoscopy showing diverticular disease.  He required blood transfusion.  Apixaban was held.  Echo showed an EF of 30 to 35% with an aortic root measuring 49 mm.  He did not follow-up with GI or cardiology thereafter.  Most recent echo from 03/2021 showed an EF of 30%, global hypokinesis, mildly dilated LV internal cavity size, mild LVH, low normal RV systolic function with normal ventricular cavity size, severe biatrial enlargement, mild mitral regurgitation, and severe dilatation of the aortic root and ascending aorta  measuring 55 mm.  He was referred to [facility] for evaluation and management of his dilated aortic root and ascending aortic aneurysm, though no showed for his appointment.  He has been lost to follow-up since his last visit with us in 11/2020.  [facility] with a 3 to 4-week history of increasing shortness of breath that became acutely worse 2 days prior to admission.  No frank chest pain.  In this setting, he has also noted increased lower extremity swelling and intermittent orthopnea.  He reports intermittent adherence to medications and does report he sometimes takes a "blood thinner."  Initial high-sensitivity troponin 25 with a delta troponin of 28.  BNP 575.  CTA of the chest/abdomen/pelvis showed no acute findings or aortic dissection/interval hematoma/convincing penetrating ulcer.  There was no change when compared to CT from 07/2021.  Dilated ascending thoracic aorta was noted with prominent descending portion stable.  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  Imaging also demonstrated gallbladder distention with an indistinct wall and adjacent hazy fat infiltration.  Follow-up MRCP showed potential sludge in the gallbladder with thickened gallbladder wall and edematous with pericholecystic fluid.  GI planning for conservative management with outpatient follow-up.  With regards to aneurysms as outlined above, patient has been evaluated by vascular surgery with plans to undergo stent graft aneurysm repair with coil embolization  of the left internal iliac artery under general anesthesia.  Cardiology is asked to provide risk stratification.  As outlined above, the patient  reports a 3 to 4-week history of increasing shortness of breath that became acutely worse 2 days prior to admission.  Workup as outlined above.  On telemetry, patient is in A-fib with RVR with ventricular rates in the low 100s to 150s bpm.  Not currently anticoagulated.  He reports dyspnea with ambulation from the hospital bed to the restroom.  He also notes intermittent orthopnea and mild lower extremity edema.  He continues to smoke a quarter to half pack of cigarettes per day and drinks approximately 12 beers per week.    Past Medical History:  Diagnosis Date   Arthritis    Ascending aortic aneurysm (HCC)    a. 09/2017 Stable TAA - 5.1cm.   Asthma    Chronic systolic CHF (congestive heart failure) (HCC)    a. EF 25-30% by echo in 07/2016 with cath showing no significant CAD b. 01/2017: EF 30-35% with diffuse HK and moderate MR; c. 07/2017 Echo: EF 30-35%, diff hK. Mild MR, mildly dil LA. PASP .   Diverticulitis    Diverticulitis of large intestine with perforation without abscess or bleeding 05/13/2017   Hypertension    Hyperthyroidism    NICM (nonischemic cardiomyopathy) (HCC)    Noncompliance    Persistent atrial fibrillation (HCC)    a. CHA2DS2VASc = 3-->Eliquis (? compliance).    Past Surgical History:  Procedure Laterality Date   COLONOSCOPY N/A 08/27/2019   Procedure: COLONOSCOPY;  Surgeon: Toledo, Boykin Nearing, MD;  Location: [facility] ENDOSCOPY;  Service: Gastroenterology;  Laterality: N/A;   JOINT REPLACEMENT Right    RIGHT/LEFT HEART CATH AND CORONARY ANGIOGRAPHY N/A 08/02/2016   Procedure: Right/Left Heart Cath and Coronary Angiography;  Surgeon: Iran Ouch, MD;  Location: [facility] INVASIVE CV LAB;  Service: Cardiovascular;  Laterality: N/A;   TESTICLE SURGERY     Patient states that he had to have the tube fixed.     Home Meds: Prior to Admission medications   Medication Sig Start Date End Date Taking? Authorizing Provider  acetaminophen (TYLENOL) 500 MG tablet Take  500-1,000 mg by mouth every 6 (six) hours as needed for mild pain, fever or headache.    Yes [provider]  albuterol (VENTOLIN HFA) 108 (90 Base) MCG/ACT inhaler TAKE 2 PUFFS BY MOUTH EVERY 6 HOURS AS NEEDED FOR WHEEZE OR SHORTNESS OF BREATH 10/26/21  Yes Hack, Megan P, DO  atorvastatin (LIPITOR) 40 MG tablet TAKE 1 TABLET BY MOUTH EVERY DAY 05/28/22  Yes Holtmeyer, Megan P, DO  budesonide-formoterol (SYMBICORT) 160-4.5 MCG/ACT inhaler Inhale 2 puffs into the lungs in the morning and at bedtime.   Yes [provider]  furosemide (LASIX) 40 MG tablet TAKE 1 TABLET BY MOUTH EVERY DAY 03/01/22  Yes Outman, Megan P, DO  hydrocortisone (ANUSOL-HC) 25 MG suppository One suppository twice a day as needed for hemmorhoids, (okay to substitute generic) 10/30/20  Yes Pupo, Megan P, DO  irbesartan (AVAPRO) 150 MG tablet TAKE 1 TABLET (150 MG TOTAL) BY MOUTH DAILY. OFFICE VISIT NEEDED FOR ADDITIONAL REFILLS 06/14/22  Yes Reiling, Megan P, DO  JARDIANCE 10 MG TABS tablet TAKE 1 TABLET BY MOUTH DAILY BEFORE BREAKFAST. 05/04/22  Yes Iran Ouch, MD  KLOR-CON M20 20 MEQ tablet TAKE 1 TABLET BY MOUTH EVERY DAY 05/28/22  Yes Lempke, Megan P, DO  metFORMIN (GLUCOPHAGE-XR) 500 MG 24 hr tablet TAKE 1 TABLET BY  MOUTH IN THE MORNING AND AT BEDTIME. OFFICE VISIT NEEDED FOR ADDITIONAL REFILLS 06/14/22  Yes Castelli, Megan P, DO  metoprolol succinate (TOPROL-XL) 100 MG 24 hr tablet Take 1 tablet (100 mg total) by mouth daily. TAKE WITH OR IMMEDIATELY FOLLOWING A MEAL.PLEASE CALL OFFICE TO SCHEDULE FOLLOW  UP APPOINTMENT 06/19/21  Yes Agbor-Etang, Arlys John, MD  spironolactone (ALDACTONE) 25 MG tablet TAKE 1 TABLET (25 MG TOTAL) BY MOUTH DAILY. 02/16/22  Yes Bogen, Megan P, DO  thiamine 100 MG tablet Take 1 tablet (100 mg total) by mouth daily. 10/30/20  Yes Hammitt, Megan P, DO  budesonide-formoterol (SYMBICORT) 160-4.5 MCG/ACT inhaler TAKE 2 PUFFS BY MOUTH TWICE A DAY 12/18/21   Commerford, Megan P, DO   erythromycin ophthalmic ointment Place 1 Application into the right eye at bedtime. Apply 1/2 inch of Erythromycin to lower eyelid before bed. Patient not taking: Reported on 06/14/2022 05/24/22   Orvil Feil, PA-C  folic acid (FOLVITE) 1 MG tablet TAKE 1 TABLET BY MOUTH EVERY DAY Patient not taking: Reported on 06/14/2022 09/24/21   Olevia Perches P, DO  hydroxypropyl methylcellulose / hypromellose (ISOPTO TEARS / GONIOVISC) 2.5 % ophthalmic solution Place 1 drop into the right eye 3 (three) times daily as needed for dry eyes. Patient not taking: Reported on 06/14/2022 05/24/22   Orvil Feil, PA-C    Inpatient Medications: Scheduled Meds:  alum & mag hydroxide-simeth  30 mL Oral Once   And   lidocaine  15 mL Oral Once   heparin  5,000 Units Subcutaneous Q8H   irbesartan  150 mg Oral Daily   metoprolol succinate  100 mg Oral Daily   mometasone-formoterol  2 puff Inhalation BID   ondansetron (ZOFRAN) IV  4 mg Intravenous Once   pantoprazole (PROTONIX) IV  40 mg Intravenous Q12H   spironolactone  25 mg Oral Daily   thiamine  100 mg Oral Daily   Continuous Infusions:  sodium chloride Stopped (06/15/22 0717)   cefTRIAXone (ROCEPHIN)  IV Stopped (06/14/22 1955)   metronidazole Stopped (06/15/22 0658)   PRN Meds: sodium chloride, acetaminophen **OR** acetaminophen, albuterol, LORazepam, morphine injection, nicotine, nitroGLYCERIN, ondansetron **OR** ondansetron (ZOFRAN) IV, senna-docusate  Allergies:  No Known Allergies  Social History:   Social History   Socioeconomic History   Marital status: Single    Spouse name: Not on file   Number of children: Not on file   Years of education: Not on file   Highest education level: Not on file  Occupational History   Occupation: retired  Tobacco Use   Smoking status: Some Days    Packs/day: 0.25    Years: 25.00    Total pack years: 6.25    Types: Pipe, Cigarettes   Smokeless tobacco: Never   Tobacco comments:    pipe tobacco    Vaping Use   Vaping Use: Never used  Substance and Sexual Activity   Alcohol use: Yes    Comment: Socially - once a month   Drug use: No   Sexual activity: Not Currently  Other Topics Concern   Not on file  Social History Narrative   Not on file   Social Determinants of Health   Financial Resource Strain: Low Risk  (07/11/2020)   Overall Financial Resource Strain (CARDIA)    Difficulty of Paying Living Expenses: Not hard at all  Food Insecurity: No Food Insecurity (06/15/2022)   Hunger Vital Sign    Worried About Running Out of Food in the Last Year: Never true  Ran Out of Food in the Last Year: Never true  Transportation Needs: No Transportation Needs (06/15/2022)   PRAPARE - Administrator, Civil Service (Medical): No    Lack of Transportation (Non-Medical): No  Physical Activity: Insufficiently Active (07/11/2020)   Exercise Vital Sign    Days of Exercise per Week: 2 days    Minutes of Exercise per Session: 20 min  Stress: No Stress Concern Present (07/11/2020)   [employer] of Occupational Health - Occupational Stress Questionnaire    Feeling of Stress : Not at all  Social Connections: Not on file  Intimate Partner Violence: Not At Risk (06/15/2022)   Humiliation, Afraid, Rape, and Kick questionnaire    Fear of Current or Ex-Partner: No    Emotionally Abused: No    Physically Abused: No    Sexually Abused: No     Family History:   Family History  Problem Relation Age of Onset   Diabetes Mother    Diabetes Father    Heart disease Brother    Heart attack Brother    Diabetes Brother    Kidney failure Brother    Thyroid disease Neg Hx     ROS:  Review of Systems  Constitutional:  Positive for malaise/fatigue. Negative for chills, diaphoresis, fever and weight loss.  HENT:  Negative for congestion.   Eyes:  Negative for discharge and redness.  Respiratory:  Positive for shortness of breath. Negative for cough, sputum production and wheezing.    Cardiovascular:  Positive for orthopnea and leg swelling. Negative for chest pain, palpitations, claudication and PND.  Gastrointestinal:  Negative for abdominal pain, blood in stool, heartburn, melena, nausea and vomiting.  Musculoskeletal:  Negative for falls and myalgias.  Skin:  Negative for rash.  Neurological:  Negative for dizziness, tingling, tremors, sensory change, speech change, focal weakness, loss of consciousness and weakness.  Endo/Heme/Allergies:  Does not bruise/bleed easily.  Psychiatric/Behavioral:  Negative for substance abuse. The patient is not nervous/anxious.   All other systems reviewed and are negative.     Physical Exam/Data:   Vitals:   06/14/22 2234 06/15/22 0005 06/15/22 0238 06/15/22 0900  BP:  (!) 131/107 (!) 137/108 (!) 134/107  Pulse:  95 (!) 109 95  Resp:  18 18 18   Temp: 97.6 F (36.4 C) 97.8 F (36.6 C) 97.8 F (36.6 C) 97.8 F (36.6 C)  TempSrc: Oral Oral Oral Oral  SpO2:  98% 99% 99%  Weight:   124.3 kg   Height:   6' (1.829 m)     Intake/Output Summary (Last 24 hours) at 06/15/2022 1506 Last data filed at 06/15/2022 1404 Gross per 24 hour  Intake 306.65 ml  Output 425 ml  Net -118.35 ml   Filed Weights   06/14/22 1222 06/15/22 0238  Weight: 122.5 kg 124.3 kg   Body mass index is 37.17 kg/m.   Physical Exam: General: Well developed, well nourished, in no acute distress. Head: Normocephalic, atraumatic, sclera non-icteric, no xanthomas, nares without discharge.  Neck: Negative for carotid bruits. JVD  elevated approximately 8 to 10 cm. Lungs: Diminished breath sounds bilaterally. Breathing is unlabored. Heart: Tachycardic and irregularly irregular with S1 S2. No murmurs, rubs, or gallops appreciated. Abdomen: Soft, non-tender, mildly distended with normoactive bowel sounds. No hepatomegaly. No rebound/guarding. No obvious abdominal masses. Msk:  Strength and tone appear normal for age. Extremities: No clubbing or cyanosis.  Trace  pretibial bilateral edema. Distal pedal pulses are 2+ and equal bilaterally. Neuro: Alert and oriented  X 3. No facial asymmetry. No focal deficit. Moves all extremities spontaneously. Psych:  Responds to questions appropriately with a normal affect.   EKG:  The EKG was personally reviewed and demonstrates: A-fib with RVR, 113 bpm, left anterior fascicular block, baseline wandering, nonspecific ST-T changes Telemetry:  Telemetry was personally reviewed and demonstrates: A-fib with RVR with ventricular rates in the low 100s to 150s bpm  Weights: Filed Weights   06/14/22 1222 06/15/22 0238  Weight: 122.5 kg 124.3 kg    Relevant CV Studies:  2D echo 03/13/2021: 1. Left ventricular ejection fraction, by estimation, is 30%. The left  ventricle has severely decreased function. The left ventricle demonstrates  global hypokinesis. The left ventricular internal cavity size was mildly  dilated. There is mild left  ventricular hypertrophy. Left ventricular diastolic parameters are  indeterminate.   2. Right ventricular systolic function is low normal. The right  ventricular size is normal.   3. Left atrial size was severely dilated.   4. Right atrial size was severely dilated.   5. The mitral valve is normal in structure. Mild mitral valve  regurgitation.   6. The aortic valve was not well visualized. Aortic valve regurgitation  is not visualized.   7. Aortic dilatation noted. 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  __________  2D echo 07/14/2017: - Left ventricle: The cavity size was normal. There was mild    concentric hypertrophy. Systolic function was moderately to    severely reduced. The estimated ejection fraction was in the    range of 30% to 35%. Diffuse hypokinesis. The study is not    technically sufficient to allow evaluation of LV diastolic    function.  - Mitral valve: There was mild regurgitation.  - Left atrium: The  atrium was mildly dilated.  - Right ventricle: Systolic function was normal.  - Right atrium: The atrium was mildly dilated.  - Pulmonary arteries: Systolic pressure was mildly elevated. PA    peak pressure: 40 mm Hg (S).   Impressions:   - Rhythm is atrial fibrillation.  __________  2D echo 02/02/2017: - Left ventricle: The cavity size was normal. Wall thickness was    increased in a pattern of moderate LVH. Systolic function was    moderately to severely reduced. The estimated ejection fraction    was in the range of 30% to 35%. Diffuse hypokinesis. Regional    wall motion abnormalities cannot be excluded.  - Aorta: Aortic root dimension: 47 mm (ED). Ascending aortic    diameter: 50 mm (S).  - Aortic root: The aortic root was moderately dilated.  - Ascending aorta: The ascending aorta was moderately dilated.  - Mitral valve: There was moderate regurgitation.  - Left atrium: The atrium was severely dilated.  - Right ventricle: The cavity size was mildly dilated. Systolic    function was mildly to moderately reduced.  - Right atrium: The atrium was severely dilated.  - Pulmonary arteries: Systolic pressure was mildly to moderately    increased, in the range of 40 mm Hg to 45 mm Hg.  __________  [facility] 08/02/2016: 1. No significant coronary artery disease. 2. Severely reduced LV systolic function by echo. Left ventricular angiography was not performed.  3. Right heart catheterization showed minimally elevated filling pressures and pulmonary hypertension. Moderately reduced cardiac output. RA pressure: 12 mmHg, RV pressure: 40 over 6 mmHg, PA pressure 37/12 with a mean of 12 mmHg. Pulmonary capillary  wedge pressure was 12. Cardiac output was 4.16 with a cardiac index of 1.77.   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 His volume status appears to be close to normal. Thus, I switched furosemide to 40 mg by mouth twice daily. Ventricular  rate is still not optimally controlled and thus I increased Toprol to 200 mg once daily. Start long-term anticoagulation with Eliquis 5 mg twice daily starting tomorrow.  The patient was noted to have apnea episodes during cardiac catheterization. Recommend outpatient evaluation for sleep apnea. Avoid catheterization via the right radial artery in the future due to tortuous innominate artery and significantly dilated aortic root. __________  2D echo 07/29/2016: - Left ventricle: The cavity size was mildly dilated. There was    moderate concentric hypertrophy. Systolic function was severely    reduced. The estimated ejection fraction was in the range of 25%    to 30%. Diffuse hypokinesis.  - Aorta: Aortic root dimension: 50 mm (ED).  - Aortic root: The aortic root was moderately dilated.  - Mitral valve: There was moderate regurgitation.  - Left atrium: The atrium was severely dilated.  - Right atrium: The atrium was severely dilated.  - Pulmonary arteries: Systolic pressure was moderately increased.    PA peak pressure: 50 mm Hg (S).   Laboratory Data:  Chemistry Recent Labs  Lab 06/14/22 1224 06/15/22 0304  NA 133* 134*  K 3.7 4.1  CL 101 99  CO2 21* 23  GLUCOSE 113* 134*  BUN 14 21  CREATININE 0.76 1.00  CALCIUM 9.1 9.0  GFRNONAA >60 >60  ANIONGAP 11 12    Recent Labs  Lab 06/14/22 1413 06/15/22 0304  PROT 7.5 8.0  ALBUMIN 4.0 4.3  AST 23 39  ALT 20 31  ALKPHOS 64 68  BILITOT 1.4* 1.8*   Hematology Recent Labs  Lab 06/14/22 1224 06/15/22 0304  WBC 5.4 5.5  RBC 4.72 4.76  HGB 13.7 13.6  HCT 42.3 41.8  MCV 89.6 87.8  MCH 29.0 28.6  MCHC 32.4 32.5  RDW 14.8 14.8  PLT 159 169   Cardiac EnzymesNo results for input(s): "TROPONINI" in the last 168 hours. No results for input(s): "TROPIPOC" in the last 168 hours.  BNP Recent Labs  Lab 06/14/22 1321  BNP 575.5*    DDimer No results for input(s): "DDIMER" in the last 168 hours.  Radiology/Studies:  MR  ABDOMEN MRCP WO CONTRAST  Result Date: 06/15/2022 IMPRESSION: 1. Limited study demonstrating biliary sludge in the gallbladder. Gallbladder wall appears thickened and edematous with pericholecystic fluid. There is also fluid adjacent to the head of the pancreas and the duodenal. Whether this reflects an acute cholecystitis or secondary to underlying pancreatitis is uncertain on the basis of today's examination. 2. No definite evidence of choledocholithiasis. No signs of biliary tract obstruction. 3. Mild hepatic steatosis. 4. Aortic atherosclerosis with suprarenal abdominal aortic aneurysm measuring up to 4.0 cm in diameter. Recommend follow-up every 12 months and vascular consultation. This recommendation follows ACR consensus guidelines: White Paper of the ACR Incidental Findings Committee II on Vascular Findings. J Am Coll Radiol 2013; 10:789-794. Electronically Signed   By: Trudie Reedaniel  Entrikin M.D.   On: 06/15/2022 05:26   MR 3D Recon At Scanner  Result Date: 06/15/2022 IMPRESSION: 1. Limited study demonstrating biliary sludge in the gallbladder. Gallbladder wall appears thickened and edematous with pericholecystic fluid. There is also fluid adjacent to the head of the pancreas and the duodenal. Whether this reflects an acute cholecystitis or  secondary to underlying pancreatitis is uncertain on the basis of today's examination. 2. No definite evidence of choledocholithiasis. No signs of biliary tract obstruction. 3. Mild hepatic steatosis. 4. Aortic atherosclerosis with suprarenal abdominal aortic aneurysm measuring up to 4.0 cm in diameter. Recommend follow-up every 12 months and vascular consultation. This recommendation follows ACR consensus guidelines: White Paper of the ACR Incidental Findings Committee II on Vascular Findings. J Am Coll Radiol 2013; 10:789-794. Electronically Signed   By: Trudie Reed M.D.   On: 06/15/2022 05:26   DG Cervical Spine 2 or 3 views  Result Date: 06/14/2022 IMPRESSION:  1. Multiple metallic shrapnel remnants abutting the right occipital bone. Electronically Signed   By: Helyn Numbers M.D.   On: 06/14/2022 22:32   US Abdomen Limited RUQ (LIVER/GB)  Result Date: 06/14/2022 IMPRESSION: 1. Abnormal appearance of the gallbladder. There is apparent material within the gallbladder, non mobile, along the wall adjacent to the liver. This could potentially reflect focal wall edema due to adjacent liver pathology. It could reflect adherent sludge. This material contains internal cystic areas which argues against neoplasm, although neoplasm not fully excluded. Findings could be further assessed, when the patient can better tolerate the procedure, with liver MRI without and with contrast including MRCP. 2. No pericholecystic fluid, no stones and no sonographic Murphy's sign to indicate acute cholecystitis. 3. Subtle liver surface nodularity. Consider cirrhosis. Increased liver parenchymal echogenicity consistent with hepatic steatosis. Electronically Signed   By: Amie Portland M.D.   On: 06/14/2022 17:35   CT Angio Chest/Abd/Pel for Dissection W and/or Wo Contrast  Result Date: 06/14/2022 IMPRESSION: CTA 1. No acute findings. No aortic dissection, intramural hematoma or convincing penetrating ulcer. No change when compared to the CT from 08/04/2021. 2. Dilated ascending thoracic aorta, prominent descending portion, also stable. Current maximum dimension of the ascending portion, 4.9 cm at the sino-tubular junction, 4.8 cm of the ascending portion at the level of the main pulmonary artery. Ascending thoracic aortic aneurysm. Recommend semi-annual imaging followup by CTA or MRA and referral to cardiothoracic surgery if not already obtained. This recommendation follows 2010 ACCF/AHA/AATS/ACR/ASA/SCA/SCAI/SIR/STS/SVM Guidelines for the Diagnosis and Management of Patients With Thoracic Aortic Disease. Circulation. 2010; 121: X735-H299. Aortic aneurysm NOS (ICD10-I71.9) 3. Focal dilation of  the proximal abdominal aorta to 3.8 cm associated with eccentric mural thrombus, unchanged from the prior CT. Infrarenal portion dilated two 3 cm. 4. 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 NON CTA 1. Gallbladder distended with an indistinct wall an adjacent hazy fat infiltration. Consider acute cholecystitis in the proper clinical setting, which could be further assessed with limited right upper quadrant ultrasound. No visualized stones. 2. No other evidence of an acute abnormality within the chest, abdomen or pelvis. 3. Multiple chronic findings as detailed above stable compared to the prior CT. Electronically Signed   By: Amie Portland M.D.   On: 06/14/2022 16:09   DG Chest 2 View  Result Date: 06/14/2022 IMPRESSION: 1. No active cardiopulmonary disease. 2. Cardiomegaly. Electronically Signed   By: Agustin Cree M.D.   On: 06/14/2022 13:17    Assessment and Plan:   1.  Acute on chronic HFrEF secondary to NICM: -Not well compensated and volume overloaded, likely exacerbated by persistent A-fib with RVR -Add Lopressor as outlined below for improved ventricular rate control for A-fib with recommendation to consolidate beta-blocker to Toprol-XL or carvedilol prior to discharge given underlying cardiomyopathy -Give a one-time dose of IV Lasix 40 mg with close monitoring of urine output, renal  function, and weight, will likely need further diuresis -He remains on PTA irbesartan and spironolactone -Obtain echo -Further optimization of his cardiomyopathy with escalation of GDMT will be pursued as tolerated -CHF education -Daily weights -Strict ins and outs  2.  Long standing persistent if not permanent A-fib with RVR: -Ventricular rates poorly controlled in the upper 100s to 150s bpm, likely contributing to heart failure -Stop Toprol-XL for now with recommendation to add Lopressor 25 mg twice daily for increased dose titration availability -Start heparin drip with recommendation to transition to [facility]  prior to discharge, monitor hemoglobin in the context of prior GI bleed -CHA2DS2-VASc at least 5 (CHF, HTN, age x 1, DM, vascular disease) -Hemodynamically stable, no current indication for emergent TEE guided DCCV -Would recommend rate control for now  -There are no available EKGs in Epic that demonstrated sinus rhythm, therefore rhythm control strategy may be difficult  3.  Nonobstructive CAD with elevated high-sensitivity troponin: -Patient's presenting complaints were exertional dyspnea over 3 to 4-week history that became acutely worse 1 to 2 days prior to admission -Elevated high sensitive troponin likely in the setting of supply demand ischemia and not consistent with ACS -Patient does have numerous risk factors for ischemic heart disease -Obtain echo as outlined above with further recommendations regarding potential ischemic evaluation pending results -Aggressive risk factor modification -Last LDL 33 in 06/2021  4.  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  Dilated ascending aorta: -Referred to [facility] last year, patient was a no-show and has not followed up with our office since -Will need outpatient follow-up and referral to cardiothoracic surgery  6.  Tobacco abuse: -Continues to smoke a quarter to half pack per day, complete cessation is recommended  7.  Medication nonadherence: -Reports intermittently running out of medications for several days at a time -Adherence recommended    For questions or updates, please contact [facility] Please consult [email or web address] for contact info under Cardiology/STEMI.   Signed, Eula Listen, PA-C [facility] Pager: [phone] 06/15/2022, 3:06 PM

## 2022-06-15 NOTE — Progress Notes (Signed)
Metal implant  Discussed the occipital shrapnel remnants abutting the right occipital with radiology, Dr. Christa See who states the shrapnel are not in concerning locations. - Patient can get MRCP -The strep pneumo he did and become warm or cause mild discomfort - He states that the MRI tech can call him and he can help discussed the tach with helping process sending the patient for increased comfort  MRCP ordered GI consulted via staff message to Dr. Virgina Jock  Dr. Tobie Poet

## 2022-06-16 DIAGNOSIS — Z01810 Encounter for preprocedural cardiovascular examination: Secondary | ICD-10-CM

## 2022-06-16 DIAGNOSIS — I4891 Unspecified atrial fibrillation: Secondary | ICD-10-CM | POA: Diagnosis not present

## 2022-06-16 DIAGNOSIS — I5020 Unspecified systolic (congestive) heart failure: Secondary | ICD-10-CM

## 2022-06-16 DIAGNOSIS — I509 Heart failure, unspecified: Secondary | ICD-10-CM

## 2022-06-16 DIAGNOSIS — R1011 Right upper quadrant pain: Secondary | ICD-10-CM | POA: Diagnosis not present

## 2022-06-16 DIAGNOSIS — I428 Other cardiomyopathies: Secondary | ICD-10-CM

## 2022-06-16 DIAGNOSIS — R079 Chest pain, unspecified: Secondary | ICD-10-CM | POA: Diagnosis not present

## 2022-06-16 LAB — ECHOCARDIOGRAM COMPLETE
Area-P 1/2: 5.66 cm2
Height: 72 in
S' Lateral: 5.1 cm
Weight: 4384.51 oz

## 2022-06-16 LAB — COMPREHENSIVE METABOLIC PANEL
ALT: 88 U/L — ABNORMAL HIGH (ref 0–44)
AST: 122 U/L — ABNORMAL HIGH (ref 15–41)
Albumin: 4.1 g/dL (ref 3.5–5.0)
Alkaline Phosphatase: 70 U/L (ref 38–126)
Anion gap: 11 (ref 5–15)
BUN: 34 mg/dL — ABNORMAL HIGH (ref 8–23)
CO2: 26 mmol/L (ref 22–32)
Calcium: 8.7 mg/dL — ABNORMAL LOW (ref 8.9–10.3)
Chloride: 98 mmol/L (ref 98–111)
Creatinine, Ser: 1.17 mg/dL (ref 0.61–1.24)
GFR, Estimated: >60 mL/min (ref >60)
Glucose, Bld: 88 mg/dL (ref 70–99)
Potassium: 4 mmol/L (ref 3.5–5.1)
Sodium: 135 mmol/L (ref 135–145)
Total Bilirubin: 1.4 mg/dL — ABNORMAL HIGH (ref 0.3–1.2)
Total Protein: 7.6 g/dL (ref 6.5–8.1)

## 2022-06-16 LAB — CBC
HCT: 41.4 % (ref 39.0–52.0)
Hemoglobin: 13.6 g/dL (ref 13.0–17.0)
MCH: 29.7 pg (ref 26.0–34.0)
MCHC: 32.9 g/dL (ref 30.0–36.0)
MCV: 90.4 fL (ref 80.0–100.0)
Platelets: 148 10*3/uL — ABNORMAL LOW (ref 150–400)
RBC: 4.58 MIL/uL (ref 4.22–5.81)
RDW: 14.8 % (ref 11.5–15.5)
WBC: 5.8 10*3/uL (ref 4.0–10.5)
nRBC: 0 % (ref 0.0–0.2)

## 2022-06-16 LAB — ABO/RH: ABO/RH(D): A POS

## 2022-06-16 LAB — HEPARIN LEVEL (UNFRACTIONATED)
Heparin Unfractionated: 0.18 IU/mL — ABNORMAL LOW (ref 0.30–0.70)
Heparin Unfractionated: 0.37 IU/mL (ref 0.30–0.70)

## 2022-06-16 MED ORDER — FUROSEMIDE 10 MG/ML IJ SOLN
40.0000 mg | Freq: Two times a day (BID) | INTRAMUSCULAR | Status: DC
  Administered 2022-06-16 – 2022-06-20 (×7): 40 mg via INTRAVENOUS
  Filled 2022-06-16 (×7): qty 4

## 2022-06-16 MED ORDER — HALOPERIDOL LACTATE 5 MG/ML IJ SOLN
INTRAMUSCULAR | Status: AC
  Administered 2022-06-16: 5 mg via INTRAVENOUS
  Filled 2022-06-16: qty 1

## 2022-06-16 MED ORDER — LORAZEPAM 2 MG/ML IJ SOLN
0.5000 mg | INTRAMUSCULAR | Status: AC | PRN
  Administered 2022-06-19: 0.5 mg via INTRAVENOUS
  Filled 2022-06-16: qty 1

## 2022-06-16 MED ORDER — VARENICLINE TARTRATE 0.5 MG PO TABS
1.0000 mg | ORAL_TABLET | Freq: Every day | ORAL | Status: DC
  Administered 2022-06-16 – 2022-06-26 (×11): 1 mg via ORAL
  Filled 2022-06-16 (×11): qty 2

## 2022-06-16 MED ORDER — HYDROMORPHONE HCL 1 MG/ML IJ SOLN
0.5000 mg | INTRAMUSCULAR | Status: DC | PRN
  Administered 2022-06-16 – 2022-06-17 (×2): 0.5 mg via INTRAVENOUS
  Filled 2022-06-16: qty 1
  Filled 2022-06-16 (×2): qty 0.5

## 2022-06-16 MED ORDER — HEPARIN BOLUS VIA INFUSION
1600.0000 [IU] | Freq: Once | INTRAVENOUS | Status: AC
  Administered 2022-06-16: 1600 [IU] via INTRAVENOUS
  Filled 2022-06-16: qty 1600

## 2022-06-16 MED ORDER — METOPROLOL TARTRATE 25 MG PO TABS
25.0000 mg | ORAL_TABLET | Freq: Three times a day (TID) | ORAL | Status: DC
  Administered 2022-06-16 – 2022-06-19 (×8): 25 mg via ORAL
  Filled 2022-06-16 (×8): qty 1

## 2022-06-16 MED ORDER — QUETIAPINE FUMARATE 25 MG PO TABS
25.0000 mg | ORAL_TABLET | Freq: Every evening | ORAL | Status: DC | PRN
  Administered 2022-06-16 – 2022-06-25 (×7): 25 mg via ORAL
  Filled 2022-06-16 (×7): qty 1

## 2022-06-16 MED ORDER — QUETIAPINE FUMARATE 25 MG PO TABS
25.0000 mg | ORAL_TABLET | Freq: Once | ORAL | Status: AC
  Administered 2022-06-16: 25 mg via ORAL
  Filled 2022-06-16: qty 1

## 2022-06-16 MED ORDER — HALOPERIDOL LACTATE 5 MG/ML IJ SOLN: 5.0000 mg | Freq: Once | INTRAMUSCULAR | Status: AC

## 2022-06-16 MED ORDER — FUROSEMIDE 10 MG/ML IJ SOLN
60.0000 mg | Freq: Four times a day (QID) | INTRAMUSCULAR | Status: DC
  Filled 2022-06-16: qty 8
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
## 2022-06-16 NOTE — Progress Notes (Signed)
Rounding Note    Patient Name: Eugene Henderson Date of Encounter: 06/16/2022  [facility] Cardiologist: Lorine Bears, MD   Subjective   Denies chest pain or palpitations.  Eating breakfast.  No acute events overnight.  Inpatient Medications    Scheduled Meds:  chlorhexidine  60 mL Topical Once   furosemide  60 mg Intravenous Q6H   irbesartan  150 mg Oral Daily   metoprolol tartrate  25 mg Oral TID   mometasone-formoterol  2 puff Inhalation BID   pantoprazole (PROTONIX) IV  40 mg Intravenous Q12H   spironolactone  25 mg Oral Daily   thiamine  100 mg Oral Daily   varenicline  1 mg Oral Daily   Continuous Infusions:  sodium chloride Stopped (06/15/22 0717)   heparin 1,850 Units/hr (06/16/22 4097)   PRN Meds: sodium chloride, acetaminophen **OR** acetaminophen, albuterol, LORazepam, nitroGLYCERIN, ondansetron **OR** ondansetron (ZOFRAN) IV, QUEtiapine   Vital Signs    Vitals:   06/16/22 0019 06/16/22 0441 06/16/22 0800 06/16/22 0957  BP: 90/68 (!) 132/99 (!) 128/94 113/88  Pulse:  93 (!) 108 96  Resp:  20 (!) 21   Temp:  97.7 F (36.5 C) (!) 97.5 F (36.4 C)   TempSrc:  Oral    SpO2:  98% 99% 97%  Weight:      Height:        Intake/Output Summary (Last 24 hours) at 06/16/2022 1053 Last data filed at 06/15/2022 2011 Gross per 24 hour  Intake 241.09 ml  Output 350 ml  Net -108.91 ml      06/15/2022    2:38 AM 06/14/2022   12:22 PM 05/24/2022    5:37 PM  Last 3 Weights  Weight (lbs) 274 lb 0.5 oz 270 lb 274 lb 14.6 oz  Weight (kg) 124.3 kg 122.471 kg 124.7 kg      Telemetry    Atrial fibrillation heart rate 78- Personally Reviewed  ECG     - Personally Reviewed  Physical Exam   GEN: No acute distress, appears somnolent.   Neck: No JVD Cardiac: Irregular irregular Respiratory: Poor inspiratory effort, diminished breath sounds GI: Soft, nontender, distended  MS: No edema; No deformity. Neuro:  Nonfocal  Psych: Normal affect   Labs     High Sensitivity Troponin:   Recent Labs  Lab 06/14/22 1224 06/14/22 1413  TROPONINIHS 25* 28*     Chemistry Recent Labs  Lab 06/14/22 1224 06/14/22 1413 06/14/22 1835 06/15/22 0304 06/16/22 0447  NA 133*  --   --  134* 135  K 3.7  --   --  4.1 4.0  CL 101  --   --  99 98  CO2 21*  --   --  23 26  GLUCOSE 113*  --   --  134* 88  BUN 14  --   --  21 34*  CREATININE 0.76  --   --  1.00 1.17  CALCIUM 9.1  --   --  9.0 8.7*  MG  --   --  2.0  --   --   PROT  --  7.5  --  8.0 7.6  ALBUMIN  --  4.0  --  4.3 4.1  AST  --  23  --  39 122*  ALT  --  20  --  31 88*  ALKPHOS  --  64  --  68 70  BILITOT  --  1.4*  --  1.8* 1.4*  GFRNONAA >60  --   --  >  60 >60  ANIONGAP 11  --   --  12 11    Lipids No results for input(s): "CHOL", "TRIG", "HDL", "LABVLDL", "LDLCALC", "CHOLHDL" in the last 168 hours.  Hematology Recent Labs  Lab 06/14/22 1224 06/15/22 0304 06/16/22 0447  WBC 5.4 5.5 5.8  RBC 4.72 4.76 4.58  HGB 13.7 13.6 13.6  HCT 42.3 41.8 41.4  MCV 89.6 87.8 90.4  MCH 29.0 28.6 29.7  MCHC 32.4 32.5 32.9  RDW 14.8 14.8 14.8  PLT 159 169 148*   Thyroid No results for input(s): "TSH", "FREET4" in the last 168 hours.  BNP Recent Labs  Lab 06/14/22 1321  BNP 575.5*    DDimer No results for input(s): "DDIMER" in the last 168 hours.   Radiology    ECHOCARDIOGRAM COMPLETE  Result Date: 06/16/2022    ECHOCARDIOGRAM REPORT   Patient Name:   Eugene Henderson Date of Exam: 06/15/2022 Medical Rec #:  [id number]      Height:       72.0 in Accession #:    [id number]     Weight:       274.0 lb Date of Birth:  [birth year]      BSA:          2.436 m Patient Age:    72 years       BP:           137/108 mmHg Patient Gender: M              HR:           108 bpm. Exam Location:  [facility] Procedure: 2D Echo, Cardiac Doppler and Color Doppler Indications:     I50.21 Acute Systolic CHF  History:         Patient has prior history of Echocardiogram examinations, most                  recent  03/13/2021. Arrythmias:Atrial Fibrillation; Risk                  Factors:Hypertension. Ascending Aortic Aneurysm. Chronic                  systolic heart failure. Nonischemic cardiomyopathy.  Sonographer:     Daphine Deutscher RDCS Referring Phys:  [id number] Raymon Mutton DUNN Diagnosing Phys: Yvonne Kendall MD IMPRESSIONS  1. Left ventricular ejection fraction, by estimation, is 20 to 25%. The left ventricle has severely decreased function. The left ventricle demonstrates global hypokinesis. The left ventricular internal cavity size was severely dilated. There is mild left ventricular hypertrophy. Left ventricular diastolic function could not be evaluated.  2. Right ventricular systolic function is severely reduced. The right ventricular size is mildly enlarged. There is moderately elevated pulmonary artery systolic pressure.  3. Left atrial size was severely dilated.  4. Right atrial size was severely dilated.  5. The mitral valve is normal in structure. Moderate mitral valve regurgitation.  6. Tricuspid valve regurgitation is mild to moderate.  7. The aortic valve is tricuspid. Aortic valve regurgitation is trivial. No aortic stenosis is present.  8. Aortic dilatation noted. 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  9. The inferior vena cava is dilated in size with <50% respiratory variability, suggesting right atrial pressure of 15 mmHg. FINDINGS  Left Ventricle: Left ventricular ejection fraction, by estimation, is 20 to 25%. The left ventricle has severely decreased function. The left ventricle demonstrates  global hypokinesis. The left ventricular internal cavity size was severely dilated. There is mild left ventricular hypertrophy. Left ventricular diastolic function could not be evaluated due to atrial fibrillation. Left ventricular diastolic function could not be evaluated. Right Ventricle: The right ventricular size is  mildly enlarged. No increase in right ventricular wall thickness. Right ventricular systolic function is severely reduced. There is moderately elevated pulmonary artery systolic pressure. The tricuspid regurgitant velocity is 2.95 m/s, and with an assumed right atrial pressure of 15 mmHg, the estimated right ventricular systolic pressure is 49.8 mmHg. Left Atrium: Left atrial size was severely dilated. Right Atrium: Right atrial size was severely dilated. Pericardium: There is no evidence of pericardial effusion. Mitral Valve: The mitral valve is normal in structure. Moderate mitral valve regurgitation. Tricuspid Valve: The tricuspid valve is normal in structure. Tricuspid valve regurgitation is mild to moderate. Aortic Valve: The aortic valve is tricuspid. Aortic valve regurgitation is trivial. No aortic stenosis is present. Pulmonic Valve: The pulmonic valve was normal in structure. Pulmonic valve regurgitation is trivial. No evidence of pulmonic stenosis. Aorta: Aortic dilatation noted. 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 Pulmonary Artery: The pulmonary artery is not well seen. Venous: The inferior vena cava is dilated in size with less than 50% respiratory variability, suggesting right atrial pressure of 15 mmHg. IAS/Shunts: No atrial level shunt detected by color flow Doppler.  LEFT VENTRICLE PLAX 2D LVIDd:         6.50 cm LVIDs:         5.10 cm LV PW:         1.00 cm LV IVS:        1.00 cm LVOT diam:     2.80 cm LV SV:         44 LV SV Index:   18 LVOT Area:     6.16 cm  RIGHT VENTRICLE             IVC RV Basal diam:  4.60 cm     IVC diam: 3.20 cm RV S prime:     11.27 cm/s LEFT ATRIUM            Index        RIGHT ATRIUM           Index LA diam:      6.20 cm  2.55 cm/m   RA Area:     41.10 cm LA Vol (A2C): 207.0 ml 84.99 ml/m  RA Volume:   171.00 ml 70.21 ml/m LA Vol (A4C): 102.0 ml 41.88 ml/m  AORTIC VALVE  LVOT Vmax:   48.90 cm/s LVOT Vmean:  31.460 cm/s LVOT VTI:    0.072 m  AORTA Ao Root diam: 5.27 cm Ao Asc diam:  5.00 cm MITRAL VALVE               TRICUSPID VALVE MV Area (PHT): 5.66 cm    TR Peak grad:   34.8 mmHg MV Decel Time: 134 msec    TR Vmax:        295.00 cm/s MV E velocity: 67.10 cm/s MV A velocity: 41.90 cm/s  SHUNTS MV E/A ratio:  1.60        Systemic VTI:  0.07 m                            Systemic Diam: 2.80 cm Cristal Deer  End MD Electronically signed by Yvonne Kendallhristopher End MD Signature Date/Time: 06/16/2022/7:15:53 AM    Final    MR ABDOMEN MRCP WO CONTRAST  Result Date: 06/15/2022 CLINICAL DATA:  72 year old male with history of right upper quadrant abdominal pain. Suspected biliary disease. EXAM: MRI ABDOMEN WITHOUT CONTRAST  (INCLUDING MRCP) TECHNIQUE: Multiplanar multisequence MR imaging of the abdomen was performed. Heavily T2-weighted images of the biliary and pancreatic ducts were obtained, and three-dimensional MRCP images were rendered by post processing. COMPARISON:  No priors. FINDINGS: Comment: Today's study is limited by artifact from substantial patient respiratory motion. Additionally, this examination is limited for detection and characterization of visceral and/or vascular lesions by lack of IV gadolinium. Lower chest: Cardiomegaly. Hepatobiliary: Mild diffuse loss of signal intensity throughout the hepatic parenchyma on out of phase dual echo images, indicative of a background of mild hepatic steatosis. In the superior aspect of segment 8 of the liver there is a 11 mm T1 hypointense, T2 hyperintense lesion which is incompletely characterized in the absence of IV gadolinium, but statistically likely a cyst. No other definite suspicious hepatic lesions are noted. MRCP images are nearly nondiagnostic, but demonstrate no intra or extrahepatic biliary ductal dilatation. Common bile duct measures 4 mm in the porta hepatis. No definite filling defects within the common bile duct to suggest  choledocholithiasis. There is some amorphous material that is slightly high T1 signal intensity and low T2 signal intensity lying dependently in the gallbladder, likely biliary sludge. Gallbladder wall appears likely mildly thickened and edematous, with trace volume of pericholecystic fluid (poorly evaluated given motion limitations of today's examination). Pancreas: No definite pancreatic mass identified on today's noncontrast examination. No pancreatic ductal dilatation. Small amount of T2 hyperintensity surrounding the head and uncinate process of the pancreas, as well as the adjacent duodenum. Body and tail of the pancreas are grossly unremarkable in appearance. Spleen:  Unremarkable. Adrenals/Urinary Tract: Bilateral kidneys and adrenal glands are unremarkable in appearance. No hydroureteronephrosis in the visualized portions of the abdomen. Stomach/Bowel: Increased T2 signal intensity surrounding portions of the duodenum. Visualized portions of stomach, small bowel and colon are otherwise unremarkable. Vascular/Lymphatic: Aortic atherosclerosis with aneurysmal dilatation of the suprarenal abdominal aorta which measures up to 4 cm in diameter at the level of the celiac axis origin. No definite lymphadenopathy noted in the abdomen. Other: Small amount of T2 hyperintensity adjacent to the head of the pancreas extending upwards into the hepatoduodenal ligament, and extending slightly caudally in the adjacent portions of the retroperitoneum. No substantial volume of ascites. Musculoskeletal: Visualized portions are grossly unremarkable. IMPRESSION: 1. Limited study demonstrating biliary sludge in the gallbladder. Gallbladder wall appears thickened and edematous with pericholecystic fluid. There is also fluid adjacent to the head of the pancreas and the duodenal. Whether this reflects an acute cholecystitis or secondary to underlying pancreatitis is uncertain on the basis of today's examination. 2. No definite  evidence of choledocholithiasis. No signs of biliary tract obstruction. 3. Mild hepatic steatosis. 4. Aortic atherosclerosis with suprarenal abdominal aortic aneurysm measuring up to 4.0 cm in diameter. Recommend follow-up every 12 months and vascular consultation. This recommendation follows ACR consensus guidelines: White Paper of the ACR Incidental Findings Committee II on Vascular Findings. J Am Coll Radiol 2013; 10:789-794. Electronically Signed   By: Trudie Reedaniel  Entrikin M.D.   On: 06/15/2022 05:26   MR 3D Recon At Scanner  Result Date: 06/15/2022 CLINICAL DATA:  72 year old male with history of right upper quadrant abdominal pain. Suspected biliary disease. EXAM: MRI ABDOMEN WITHOUT CONTRAST  (  INCLUDING MRCP) TECHNIQUE: Multiplanar multisequence MR imaging of the abdomen was performed. Heavily T2-weighted images of the biliary and pancreatic ducts were obtained, and three-dimensional MRCP images were rendered by post processing. COMPARISON:  No priors. FINDINGS: Comment: Today's study is limited by artifact from substantial patient respiratory motion. Additionally, this examination is limited for detection and characterization of visceral and/or vascular lesions by lack of IV gadolinium. Lower chest: Cardiomegaly. Hepatobiliary: Mild diffuse loss of signal intensity throughout the hepatic parenchyma on out of phase dual echo images, indicative of a background of mild hepatic steatosis. In the superior aspect of segment 8 of the liver there is a 11 mm T1 hypointense, T2 hyperintense lesion which is incompletely characterized in the absence of IV gadolinium, but statistically likely a cyst. No other definite suspicious hepatic lesions are noted. MRCP images are nearly nondiagnostic, but demonstrate no intra or extrahepatic biliary ductal dilatation. Common bile duct measures 4 mm in the porta hepatis. No definite filling defects within the common bile duct to suggest choledocholithiasis. There is some amorphous  material that is slightly high T1 signal intensity and low T2 signal intensity lying dependently in the gallbladder, likely biliary sludge. Gallbladder wall appears likely mildly thickened and edematous, with trace volume of pericholecystic fluid (poorly evaluated given motion limitations of today's examination). Pancreas: No definite pancreatic mass identified on today's noncontrast examination. No pancreatic ductal dilatation. Small amount of T2 hyperintensity surrounding the head and uncinate process of the pancreas, as well as the adjacent duodenum. Body and tail of the pancreas are grossly unremarkable in appearance. Spleen:  Unremarkable. Adrenals/Urinary Tract: Bilateral kidneys and adrenal glands are unremarkable in appearance. No hydroureteronephrosis in the visualized portions of the abdomen. Stomach/Bowel: Increased T2 signal intensity surrounding portions of the duodenum. Visualized portions of stomach, small bowel and colon are otherwise unremarkable. Vascular/Lymphatic: Aortic atherosclerosis with aneurysmal dilatation of the suprarenal abdominal aorta which measures up to 4 cm in diameter at the level of the celiac axis origin. No definite lymphadenopathy noted in the abdomen. Other: Small amount of T2 hyperintensity adjacent to the head of the pancreas extending upwards into the hepatoduodenal ligament, and extending slightly caudally in the adjacent portions of the retroperitoneum. No substantial volume of ascites. Musculoskeletal: Visualized portions are grossly unremarkable. IMPRESSION: 1. Limited study demonstrating biliary sludge in the gallbladder. Gallbladder wall appears thickened and edematous with pericholecystic fluid. There is also fluid adjacent to the head of the pancreas and the duodenal. Whether this reflects an acute cholecystitis or secondary to underlying pancreatitis is uncertain on the basis of today's examination. 2. No definite evidence of choledocholithiasis. No signs of  biliary tract obstruction. 3. Mild hepatic steatosis. 4. Aortic atherosclerosis with suprarenal abdominal aortic aneurysm measuring up to 4.0 cm in diameter. Recommend follow-up every 12 months and vascular consultation. This recommendation follows ACR consensus guidelines: White Paper of the ACR Incidental Findings Committee II on Vascular Findings. J Am Coll Radiol 2013; 10:789-794. Electronically Signed   By: Vinnie Langton M.D.   On: 06/15/2022 05:26   DG Cervical Spine 2 or 3 views  Result Date: 06/14/2022 CLINICAL DATA:  MRI clearance EXAM: CERVICAL SPINE - 2-3 VIEW COMPARISON:  None Available. FINDINGS: There are multiple metallic shrapnel remnants noted abutting the right occipital bone measuring up to 10 mm in greatest dimension. Visualized calvarium is intact. Mild degenerative changes are seen within the cervical spine. IMPRESSION: 1. Multiple metallic shrapnel remnants abutting the right occipital bone. Electronically Signed   By: Linwood Dibbles.D.  On: 06/14/2022 22:32   US Abdomen Limited RUQ (LIVER/GB)  Result Date: 06/14/2022 CLINICAL DATA:  Follow-up possible abnormal gallbladder noted on a current CTA chest, abdomen and pelvis. Patient reportedly with chest pain, belching and left-sided abdominal pain. EXAM: ULTRASOUND ABDOMEN LIMITED RIGHT UPPER QUADRANT COMPARISON:  None Available. FINDINGS: Gallbladder: Heterogeneous echogenicity material lies along the wall adjacent to the liver, overall measuring approximately 4 x 1.2 x 2.5 cm. Wall appears thickened, up to 8 mm, although some of this measurement may be including pericholecystic fat. No pericholecystic fluid. No shadowing stone. No sonographic Murphy's sign. Common bile duct: Diameter: 2 mm Liver: Slight nodular contour. Mild increased parenchymal echogenicity. No mass. Portal vein is patent on color Doppler imaging with normal direction of blood flow towards the liver. Other: None. IMPRESSION: 1. Abnormal appearance of the  gallbladder. There is apparent material within the gallbladder, non mobile, along the wall adjacent to the liver. This could potentially reflect focal wall edema due to adjacent liver pathology. It could reflect adherent sludge. This material contains internal cystic areas which argues against neoplasm, although neoplasm not fully excluded. Findings could be further assessed, when the patient can better tolerate the procedure, with liver MRI without and with contrast including MRCP. 2. No pericholecystic fluid, no stones and no sonographic Murphy's sign to indicate acute cholecystitis. 3. Subtle liver surface nodularity. Consider cirrhosis. Increased liver parenchymal echogenicity consistent with hepatic steatosis. Electronically Signed   By: Amie Portland M.D.   On: 06/14/2022 17:35   CT Angio Chest/Abd/Pel for Dissection W and/or Wo Contrast  Result Date: 06/14/2022 CLINICAL DATA:  Chest pain. Loss of belching. Left-sided abdominal pain. Concern for acute aortic syndrome. EXAM: CT ANGIOGRAPHY CHEST, ABDOMEN AND PELVIS TECHNIQUE: Non-contrast CT of the chest was initially obtained. Multidetector CT imaging through the chest, abdomen and pelvis was performed using the standard protocol during bolus administration of intravenous contrast. Multiplanar reconstructed images and MIPs were obtained and reviewed to evaluate the vascular anatomy. RADIATION DOSE REDUCTION: This exam was performed according to the departmental dose-optimization program which includes automated exposure control, adjustment of the mA and/or kV according to patient size and/or use of iterative reconstruction technique. CONTRAST:  OMNIPAQUE IOHEXOL 350 MG/ML SOLN COMPARISON:  08/04/2021. FINDINGS: CTA CHEST FINDINGS Cardiovascular: Mild-to-moderate cardiac enlargement. Trace pericardial effusion. Three-vessel coronary artery calcifications. Ascending thoracic aorta measures 4.7 cm in diameter. No aortic dissection. Mild aortic  atherosclerosis mostly along the mid to lower descending portion. Arch branch vessels are widely patent. Mediastinum/Nodes: No neck base, mediastinal or hilar masses. Scattered prominent lymph nodes. Ascus level right paratracheal node, 1.1 cm short axis. Subcarinal node, 1.5 cm short axis. Trachea and esophagus are unremarkable. Lungs/Pleura: Mild centrilobular emphysema. Minor linear subsegmental atelectasis, dependent right middle lobe, base of the left upper lobe lingula. No evidence of pneumonia or pulmonary edema. No pleural effusion or pneumothorax. Musculoskeletal: No fracture or acute finding. No chest wall mass. Bilateral gynecomastia. Review of the MIP images confirms the above findings. CTA ABDOMEN AND PELVIS FINDINGS VASCULAR Aorta: Focal fusiform dilation of the proximal aorta, at and just below the level of the renal arteries, maximum diameter of 3.8 cm, associated with eccentric atherosclerotic plaque and mural thrombus, unchanged compared to the prior CT. Moderate atherosclerotic plaque, partly calcified. No dissection. Infrarenal portion dilated to 3 cm. Celiac: Plaque at the origin. No significant stenosis. No aneurysm or dissection. SMA: Mild plaque at the origin. No significant stenosis. No aneurysm or dissection. Renals: Both renal arteries are patent without  evidence of aneurysm, dissection, vasculitis, fibromuscular dysplasia or significant stenosis. IMA: Faintly identified, small but patent IMA. Inflow: Minimally opacified common and proximal external iliac vessels. More distally these are not opacified. 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 This is similar to the prior CT. Plaque and narrowing of these vessels cannot be accurately assessed due to limited contrast enhancement. No convincing change from the prior CT, however. Veins: No obvious venous abnormality within the limitations of this arterial phase study. Review of the MIP images confirms the above findings.  NON-VASCULAR Hepatobiliary: Overall relative decreased liver attenuation. Subtle nodularity suggested, assessment somewhat limited by patient motion. Liver normal in overall size. No defined mass. Gallbladder margins are indistinct. Haziness in the adjacent fat extending to abut the first and second portions of the duodenum. No defined stone. No convincing duct dilation. Pancreas: Partial fatty replacement of the pancreas. No mass or duct dilation. No convincing inflammation. Spleen: Normal in size without focal abnormality. Adrenals/Urinary Tract: No adrenal masses. No renal masses, stones or hydronephrosis. Normal ureters. Bladder unremarkable. Stomach/Bowel: Normal stomach. Small bowel and colon are normal in caliber. No wall thickening. No inflammation. Scattered colonic diverticula. Lymphatic: No enlarged lymph nodes. Reproductive: Unremarkable. Other: Anterior hernia mesh. Minimal fat containing umbilical hernia. Appear stable from the prior CT. No ascites. Musculoskeletal: No fracture or acute finding. No bone lesion. Degenerative changes throughout the lumbar spine. Advanced arthropathic changes of the left hip. Review of the MIP images confirms the above findings. IMPRESSION: CTA 1. No acute findings. No aortic dissection, intramural hematoma or convincing penetrating ulcer. No change when compared to the CT from 08/04/2021. 2. Dilated ascending thoracic aorta, prominent descending portion, also stable. Current maximum dimension of the ascending portion, 4.9 cm at the sino-tubular junction, 4.8 cm of the ascending portion at the level of the main pulmonary artery. Ascending thoracic aortic aneurysm. Recommend semi-annual imaging followup by CTA or MRA and referral to cardiothoracic surgery if not already obtained. This recommendation follows 2010 ACCF/AHA/AATS/ACR/ASA/SCA/SCAI/SIR/STS/SVM Guidelines for the Diagnosis and Management of Patients With Thoracic Aortic Disease. Circulation. 2010; 121: U202-R427.  Aortic aneurysm NOS (ICD10-I71.9) 3. Focal dilation of the proximal abdominal aorta to 3.8 cm associated with eccentric mural thrombus, unchanged from the prior CT. Infrarenal portion dilated two 3 cm. 4. 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 NON CTA 1. Gallbladder distended with an indistinct wall an adjacent hazy fat infiltration. Consider acute cholecystitis in the proper clinical setting, which could be further assessed with limited right upper quadrant ultrasound. No visualized stones. 2. No other evidence of an acute abnormality within the chest, abdomen or pelvis. 3. Multiple chronic findings as detailed above stable compared to the prior CT. Electronically Signed   By: Lajean Manes M.D.   On: 06/14/2022 16:09   DG Chest 2 View  Result Date: 06/14/2022 CLINICAL DATA:  Chest pain and shortness of breath EXAM: CHEST - 2 VIEW COMPARISON:  CTA chest dated 08/04/2021, chest radiograph dated 08/24/2019 FINDINGS: Normal lung volumes. No focal consolidations. No pleural effusion or pneumothorax. Enlarged cardiomediastinal silhouette. The visualized skeletal structures are unremarkable. IMPRESSION: 1. No active cardiopulmonary disease. 2. Cardiomegaly. Electronically Signed   By: Darrin Nipper M.D.   On: 06/14/2022 13:17    Cardiac Studies   TTE 06/15/2021 1. Left ventricular ejection fraction, by estimation, is 20 to 25%. The  left ventricle has severely decreased function. The left ventricle  demonstrates global hypokinesis. The left ventricular internal cavity size  was severely dilated. There is  mild  left ventricular hypertrophy. Left ventricular diastolic function could  not be evaluated.   2. Right ventricular systolic function is severely reduced. The right  ventricular size is mildly enlarged. There is moderately elevated  pulmonary artery systolic pressure.   3. Left atrial size was severely dilated.   4. Right atrial size was severely dilated.   5. The mitral valve is normal in  structure. Moderate mitral valve  regurgitation.   6. Tricuspid valve regurgitation is mild to moderate.   7. The aortic valve is tricuspid. Aortic valve regurgitation is trivial.  No aortic stenosis is present.   8. Aortic dilatation noted. 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 There is severe dilatation of the ascending aorta,  measuring 50 mm.   9. The inferior vena cava is dilated in size with <50% respiratory  variability, suggesting right atrial pressure of 15 mmHg.   LHC 2018, no significant CAD  Patient Profile     72 y.o. male with history of NICM EF 25%, persistent atrial fibrillation, hypertension, thoracic aortic aneurysm, iliac aneurysm, abdominal aortic aneurysm, COPD, smoker presenting with chest pain.  Diagnosed with significant left iliac aneurysm, intervention being planned.  Being seen for cardiomyopathy and A-fib RVR.  Assessment & Plan    Preop evaluation -Iliac aneurysm, intervention needed due to significant dilatation -Echo EF 25% -Prior left heart cath no CAD -Heart rate better controlled with Lopressor -Okay to proceed with iliac intervention as per vascular surgery as this is semiurgent.  Risk of rupture could be fatal.  2.  Nonischemic cardiomyopathy, EF 25% -Lopressor for now, switch to Toprol-XL upon discharge. -Continue Aldactone, and losartan. -IV Lasix 40 twice daily.  3.  Persistent A-fib -Heart rate is better controlled -Lopressor 25 mg 3 times daily.  Consolidate to Toprol-XL prior to discharge. -Heparin for now, Eliquis upon discharge -Monitor H&H due to history of GI bleeding/diverticular disease.  4.  Thoracic aortic aneurysm, abdominal aortic aneurysm -Did not follow-up with CT surgery as outpatient -Keep appointment with vascular, will need CT referral as outpatient  5.  Current smoker. -Cessation recommended  Total encounter time more than 50 minutes  Greater than 50% was spent in counseling and  coordination of care with the patient      Signed, Debbe Odea, MD  06/16/2022, 10:53 AM

## 2022-06-16 NOTE — H&P (View-Only) (Signed)
  Progress Note    06/16/2022 5:33 PM * Surgery Date in Future *  Subjective:*Eugene Henderson is a 72 year old male denting to the hospital with abdominal pain.  Other workup of CT scan of the abdomen pelvis he was found to have an aortic aneurysm of 4 cm and an internal iliac artery aneurysm of 4 cm.  Upon exam I found him sitting in the bedside chair resting comfortably today.  States he still has some abdominal pain and twitching.  No change since he first came to the hospital.  Complaints overnight patient's vitals all remained stable.  Patient was seen by cardiology and was given clearance to proceed with aortogram with aneurysm repair.   Vitals:   06/16/22 0957 06/16/22 1512  BP: 113/88 117/85  Pulse: 96 94  Resp:  20  Temp:  98.1 F (36.7 C)  SpO2: 97% 95%   Physical Exam: Cardiac: Irregular Lungs: Bilateral rales lower bases Incisions: NA Extremities:  Palpable pulses to bilateral lower extremities.  Abdomen:  Soft but tender. Slightly distended today.  Neurologic: AAOX3 Person, place and time  CBC    Component Value Date/Time   WBC 5.8 06/16/2022 0447   RBC 4.58 06/16/2022 0447   HGB 13.6 06/16/2022 0447   HGB 14.3 06/18/2021 1409   HCT 41.4 06/16/2022 0447   HCT 42.1 06/18/2021 1409   PLT 148 (L) 06/16/2022 0447   PLT 154 06/18/2021 1409   MCV 90.4 06/16/2022 0447   MCV 86 06/18/2021 1409   MCH 29.7 06/16/2022 0447   MCHC 32.9 06/16/2022 0447   RDW 14.8 06/16/2022 0447   RDW 15.1 06/18/2021 1409   LYMPHSABS 1.3 06/18/2021 1409   MONOABS 0.7 05/14/2017 0351   EOSABS 0.1 06/18/2021 1409   BASOSABS 0.0 06/18/2021 1409    BMET    Component Value Date/Time   NA 135 06/16/2022 0447   NA 139 06/18/2021 1409   K 4.0 06/16/2022 0447   CL 98 06/16/2022 0447   CO2 26 06/16/2022 0447   GLUCOSE 88 06/16/2022 0447   BUN 34 (H) 06/16/2022 0447   BUN 13 06/18/2021 1409   CREATININE 1.17 06/16/2022 0447   CALCIUM 8.7 (L) 06/16/2022 0447   GFRNONAA >60 06/16/2022  0447   GFRAA 96 03/28/2020 1524    INR    Component Value Date/Time   INR 1.0 06/14/2022 1224     Intake/Output Summary (Last 24 hours) at 06/16/2022 1733 Last data filed at 06/16/2022 1615 Gross per 24 hour  Intake 120 ml  Output 900 ml  Net -780 ml     Assessment/Plan:  Eugene Henderson is a 72 y.o. male who is now scheduled for aortogram to repair an aortic aneurysm and left lower extremity ileac aneurysm with stent placement tomorrow. 06/17/22. He will be made NPO after midnight. Patient verbalizes he would like to move forward with procedure again today.    Drema Pry [facility] 06/16/2022 5:33 PM

## 2022-06-16 NOTE — Progress Notes (Signed)
Mobility Specialist - Progress Note   06/16/22 1506  Mobility  Activity Transferred from bed to chair  Level of Assistance Modified independent, requires aide device or extra time  Assistive Device Four point cane  Distance Ambulated (ft) 4 ft  Activity Response Tolerated well  $Mobility charge 1 Mobility   Pt supine upon entry,utilizing RA. Pt completed bed mob ModI, STS to cane MinA and amb ModI. Pt transferred to the recliner, left sitting up with alarm set and needs within reach.   Candie Mile Mobility Specialist 06/16/22 3:08 PM

## 2022-06-16 NOTE — Consult Note (Signed)
ANTICOAGULATION CONSULT NOTE  Pharmacy Consult for Heparin Indication: atrial fibrillation  No Known Allergies  Patient Measurements: Height: 6' (182.9 cm) Weight: 124.3 kg (274 lb 0.5 oz) IBW/kg (Calculated) : 77.6 Heparin Dosing Weight: 105.2 kg  Vital Signs: Temp: 98.1 F (36.7 C) (01/10 1512) BP: 117/85 (01/10 1512) Pulse Rate: 94 (01/10 1512)  Labs: Recent Labs    06/14/22 1224 06/14/22 1413 06/15/22 0304 06/15/22 1710 06/16/22 0447 06/16/22 1816  HGB 13.7  --  13.6  --  13.6  --   HCT 42.3  --  41.8  --  41.4  --   PLT 159  --  169  --  148*  --   APTT  --   --   --  31  --   --   LABPROT 13.4  --   --   --   --   --   INR 1.0  --   --   --   --   --   HEPARINUNFRC  --   --   --   --  0.18* 0.37  CREATININE 0.76  --  1.00  --  1.17  --   TROPONINIHS 25* 28*  --   --   --   --      Estimated Creatinine Clearance: 78.9 mL/min (by C-G formula based on SCr of 1.17 mg/dL).   Medical History: Past Medical History:  Diagnosis Date   Arthritis    Ascending aortic aneurysm ([place])    a. 09/2017 Stable TAA - 5.1cm.   Asthma    Chronic systolic CHF (congestive heart failure) ([place])    a. EF 25-30% by echo in 07/2016 with cath showing no significant CAD b. 01/2017: EF 30-35% with diffuse HK and moderate MR; c. 07/2017 Echo: EF 30-35%, diff hK. Mild MR, mildly dil LA. PASP 56mmHg.   Diverticulitis    Diverticulitis of large intestine with perforation without abscess or bleeding 05/13/2017   Hypertension    Hyperthyroidism    NICM (nonischemic cardiomyopathy) ([place])    Noncompliance    Persistent atrial fibrillation (HCC)    a. CHA2DS2VASc = 3-->Eliquis (? compliance).    Medications:  No history of chronic anticoagulant use PTA  Assessment: 72yo male with PMH of HFrEF secondary to ischemic cardiomyopathy, persistent atrial fibrillation complicated by medication noncompliance, throacic and abdominal aortic anuerysms, GI bleed in 2021 prompting discontinuation of  apixaban who presented to the ED with chest pain and shortness of breath x 3 days. Pharmacy has been consulted to initiate and titrate heparin infusion while admitted.  Baseline labs: aPTT 31s, INR 1.0, Plts 169, Hgb 13.6  1/10 1816 HL 0.37, therapeutic   Goal of Therapy:  Heparin level 0.3-0.7 units/ml Monitor platelets by anticoagulation protocol: Yes   Plan:  Heparin therapeutic  -- continue heparin infusion at 1850 units/hr --Recheck anti-Xa level in 8 hours to confirm --Continue to monitor H&H and platelets  Dorothe Pea, PharmD, BCPS Clinical Pharmacist   06/16/2022 6:52 PM

## 2022-06-16 NOTE — Progress Notes (Signed)
  Progress Note    06/16/2022 5:33 PM * Surgery Date in Future *  Subjective:*Eugene Henderson is a 71-year-old male denting to the hospital with abdominal pain.  Other workup of CT scan of the abdomen pelvis he was found to have an aortic aneurysm of 4 cm and an internal iliac artery aneurysm of 4 cm.  Upon exam I found him sitting in the bedside chair resting comfortably today.  States he still has some abdominal pain and twitching.  No change since he first came to the hospital.  Complaints overnight patient's vitals all remained stable.  Patient was seen by cardiology and was given clearance to proceed with aortogram with aneurysm repair.   Vitals:   06/16/22 0957 06/16/22 1512  BP: 113/88 117/85  Pulse: 96 94  Resp:  20  Temp:  98.1 F (36.7 C)  SpO2: 97% 95%   Physical Exam: Cardiac: Irregular Lungs: Bilateral rales lower bases Incisions: NA Extremities:  Palpable pulses to bilateral lower extremities.  Abdomen:  Soft but tender. Slightly distended today.  Neurologic: AAOX3 Person, place and time  CBC    Component Value Date/Time   WBC 5.8 06/16/2022 0447   RBC 4.58 06/16/2022 0447   HGB 13.6 06/16/2022 0447   HGB 14.3 06/18/2021 1409   HCT 41.4 06/16/2022 0447   HCT 42.1 06/18/2021 1409   PLT 148 (L) 06/16/2022 0447   PLT 154 06/18/2021 1409   MCV 90.4 06/16/2022 0447   MCV 86 06/18/2021 1409   MCH 29.7 06/16/2022 0447   MCHC 32.9 06/16/2022 0447   RDW 14.8 06/16/2022 0447   RDW 15.1 06/18/2021 1409   LYMPHSABS 1.3 06/18/2021 1409   MONOABS 0.7 05/14/2017 0351   EOSABS 0.1 06/18/2021 1409   BASOSABS 0.0 06/18/2021 1409    BMET    Component Value Date/Time   NA 135 06/16/2022 0447   NA 139 06/18/2021 1409   K 4.0 06/16/2022 0447   CL 98 06/16/2022 0447   CO2 26 06/16/2022 0447   GLUCOSE 88 06/16/2022 0447   BUN 34 (H) 06/16/2022 0447   BUN 13 06/18/2021 1409   CREATININE 1.17 06/16/2022 0447   CALCIUM 8.7 (L) 06/16/2022 0447   GFRNONAA >60 06/16/2022  0447   GFRAA 96 03/28/2020 1524    INR    Component Value Date/Time   INR 1.0 06/14/2022 1224     Intake/Output Summary (Last 24 hours) at 06/16/2022 1733 Last data filed at 06/16/2022 1615 Gross per 24 hour  Intake 120 ml  Output 900 ml  Net -780 ml     Assessment/Plan:  Eugene Henderson is a 71 y.o. male who is now scheduled for aortogram to repair an aortic aneurysm and left lower extremity ileac aneurysm with stent placement tomorrow. 06/17/22. He will be made NPO after midnight. Patient verbalizes he would like to move forward with procedure again today.    Eugene Henderson [facility] 06/16/2022 5:33 PM   

## 2022-06-16 NOTE — Progress Notes (Signed)
Patient is alert and oriented x3 now. He is calm and cooperative currently. Got him up to chair and CHG bath already performed. Complete bed change completed and new gown given.

## 2022-06-16 NOTE — Consult Note (Signed)
ANTICOAGULATION CONSULT NOTE  Pharmacy Consult for Heparin Indication: atrial fibrillation  No Known Allergies  Patient Measurements: Height: 6' (182.9 cm) Weight: 124.3 kg (274 lb 0.5 oz) IBW/kg (Calculated) : 77.6 Heparin Dosing Weight: 105.2 kg  Vital Signs: Temp: 98.2 F (36.8 C) (01/10 0002) Temp Source: Oral (01/09 1507) BP: 90/68 (01/10 0019) Pulse Rate: 80 (01/10 0002)  Labs: Recent Labs    06/14/22 1224 06/14/22 1413 06/15/22 0304 06/15/22 1710  HGB 13.7  --  13.6  --   HCT 42.3  --  41.8  --   PLT 159  --  169  --   APTT  --   --   --  31  LABPROT 13.4  --   --   --   INR 1.0  --   --   --   CREATININE 0.76  --  1.00  --   TROPONINIHS 25* 28*  --   --      Estimated Creatinine Clearance: 92.3 mL/min (by C-G formula based on SCr of 1 mg/dL).   Medical History: Past Medical History:  Diagnosis Date   Arthritis    Ascending aortic aneurysm ([place])    a. 09/2017 Stable TAA - 5.1cm.   Asthma    Chronic systolic CHF (congestive heart failure) ([place])    a. EF 25-30% by echo in 07/2016 with cath showing no significant CAD b. 01/2017: EF 30-35% with diffuse HK and moderate MR; c. 07/2017 Echo: EF 30-35%, diff hK. Mild MR, mildly dil LA. PASP 52mmHg.   Diverticulitis    Diverticulitis of large intestine with perforation without abscess or bleeding 05/13/2017   Hypertension    Hyperthyroidism    NICM (nonischemic cardiomyopathy) ([place])    Noncompliance    Persistent atrial fibrillation (HCC)    a. CHA2DS2VASc = 3-->Eliquis (? compliance).    Medications:  No history of chronic anticoagulant use PTA  Assessment: 72yo male with PMH of HFrEF secondary to ischemic cardiomyopathy, persistent atrial fibrillation complicated by medication noncompliance, throacic and abdominal aortic anuerysms, GI bleed in 2021 prompting discontinuation of apixaban who presented to the ED with chest pain and shortness of breath x 3 days. Pharmacy has been consulted to initiate and  titrate heparin infusion while admitted.  Baseline labs: aPTT pending, INR 1.0, Plts 169, Hgb 13.6  Goal of Therapy:  Heparin level 0.3-0.7 units/ml Monitor platelets by anticoagulation protocol: Yes  1/09 2316 Notified pt removed IV line, heparin paused ~ 30 minutes 1/10 0130 Notified pt removed IV line again, pt combative 1/10 0220 Heparin restarted 1/10 0447 HL 0.18, subtherapeutic   Plan:  B[place] 1600 units x 1. Increase heparin infusion to 1850 units/hr Recheck anti-Xa level in 8 hours after rate change Continue to monitor H&H and platelets  Renda Rolls, PharmD, [facility] 06/16/2022 3:36 AM

## 2022-06-16 NOTE — TOC Initial Note (Signed)
Transition of Care [facility]) - Initial/Assessment Note    Patient Details  Name: Eugene Henderson MRN: [id number] Date of Birth: 13-Dec-1950  [facility]) CM/SW Contact:    Beverly Sessions, RN Phone Number: 06/16/2022, 3:35 PM  Clinical Narrative:                  Patient with high risk for readmission score  Patient A&O x2 Assessment completed phone with daughter Ellard Artis  Admitted for: Abdominal pain Admitted from: home with daughter  PCP: Wynetta Emery [facility] Current home health/prior home health/DME: No home   Per Flowsheet patient has been weaned to RA  Discussed option for home health RN for multiple medical comorbidities . She is in agreement and states she doesn't have preference of agency.  Referral made to [facility] with Alvis Lemmings  Requested PT eval from MD.  MD states will hold off on PT eval until vascular procedure and clearance        Patient Goals and CMS Choice            Expected Discharge Plan and Services                                              Prior Living Arrangements/Services                       Activities of Daily Living Home Assistive Devices/Equipment: Cane (specify quad or straight), Eyeglasses ADL Screening (condition at time of admission) Patient's cognitive ability adequate to safely complete daily activities?: Yes Is the patient deaf or have difficulty hearing?: No Does the patient have difficulty seeing, even when wearing glasses/contacts?: No Does the patient have difficulty concentrating, remembering, or making decisions?: No Patient able to express need for assistance with ADLs?: Yes Does the patient have difficulty dressing or bathing?: No Independently performs ADLs?: Yes (appropriate for developmental age) Does the patient have difficulty walking or climbing stairs?: No Weakness of Legs: Both Weakness of Arms/Hands: None  Permission Sought/Granted                  Emotional Assessment               Admission diagnosis:  Atrial fibrillation with RVR ([place]) [I48.91] Abdominal pain [R10.9] Chest pain, unspecified type [R07.9] Patient Active Problem List   Diagnosis Date Noted   HFrEF (heart failure with reduced ejection fraction) ([place]) 06/16/2022   NICM (nonischemic cardiomyopathy) ([place]) 06/16/2022   Pre-operative cardiovascular examination 06/16/2022   Atrial fibrillation with RVR ([place]) 06/15/2022   Thoracoabdominal aortic aneurysm (TAAA) ([place]) 06/15/2022   Abdominal pain 06/14/2022   Metal foreign body in head 06/14/2022   Elevated bilirubin 06/14/2022   Aortic atherosclerosis ([place]) 08/27/2020   AF (paroxysmal atrial fibrillation) ([place])    Hyperlipidemia    Gastrointestinal hemorrhage 08/24/2019   Morbid obesity ([place]) 12/11/2018   Chronic pain of right knee 12/11/2018   Advance directive discussed with patient 05/16/2018   Diabetes mellitus without complication ([place]) 77/82/4235   Chest pain 07/14/2017   Keratitis due to infection 07/06/2017   Corneal ulcer 06/24/2017   COPD (chronic obstructive pulmonary disease) ([place]) 02/03/2017   Chronic hepatitis C without hepatic coma ([place]) 12/15/2016   AAA (abdominal aortic aneurysm) ([place]) 11/29/2016   Chronic low back pain 11/29/2016   Alcohol abuse 11/29/2016   Hyponatremia 11/22/2016   Chronic  systolic heart failure ([place]) 08/19/2016   Tobacco use 08/19/2016   Snoring 08/19/2016   Persistent atrial fibrillation ([place]) 07/28/2016   Hyperthyroidism 07/28/2016   Noncompliance with medications 07/28/2016   Essential hypertension 07/28/2016   PCP:  Valerie Roys, DO Pharmacy:   [id number] [place] [place] - [address] [place] [address] Phone: [phone] Fax: [place] [address] Phone: [phone] Fax: [phone]     Social Determinants of Health (SDOH) Social History: [place]: No Food Insecurity (06/15/2022)  Housing: Low Risk  (06/15/2022)  Transportation Needs: No Transportation Needs (06/15/2022)  Utilities: Not At Risk (06/15/2022)  Depression (PHQ2-9): High Risk (06/18/2021)  Financial Resource Strain: Low Risk  (07/11/2020)  Physical Activity: Insufficiently Active (07/11/2020)  Stress: No Stress Concern Present (07/11/2020)  Tobacco Use: High Risk (06/15/2022)   SDOH Interventions:     Readmission Risk Interventions    06/16/2022    3:33 PM  Readmission Risk Prevention Plan  Transportation Screening Complete  HRI or [place] Complete  Social Work Consult for [place] Planning/Counseling Complete  Palliative Care Screening Not Applicable  Medication Review Press photographer) Complete

## 2022-06-16 NOTE — Consult Note (Signed)
ANTICOAGULATION CONSULT NOTE  Pharmacy Consult for Heparin Indication: atrial fibrillation  No Known Allergies  Patient Measurements: Height: 6' (182.9 cm) Weight: 124.3 kg (274 lb 0.5 oz) IBW/kg (Calculated) : 77.6 Heparin Dosing Weight: 105.2 kg  Vital Signs: Temp: 97.5 F (36.4 C) (01/10 0800) Temp Source: Oral (01/10 0441) BP: 128/94 (01/10 0800) Pulse Rate: 108 (01/10 0800)  Labs: Recent Labs    06/14/22 1224 06/14/22 1413 06/15/22 0304 06/15/22 1710 06/16/22 0447  HGB 13.7  --  13.6  --  13.6  HCT 42.3  --  41.8  --  41.4  PLT 159  --  169  --  148*  APTT  --   --   --  31  --   LABPROT 13.4  --   --   --   --   INR 1.0  --   --   --   --   HEPARINUNFRC  --   --   --   --  0.18*  CREATININE 0.76  --  1.00  --  1.17  TROPONINIHS 25* 28*  --   --   --      Estimated Creatinine Clearance: 78.9 mL/min (by C-G formula based on SCr of 1.17 mg/dL).   Medical History: Past Medical History:  Diagnosis Date   Arthritis    Ascending aortic aneurysm ([place])    a. 09/2017 Stable TAA - 5.1cm.   Asthma    Chronic systolic CHF (congestive heart failure) ([place])    a. EF 25-30% by echo in 07/2016 with cath showing no significant CAD b. 01/2017: EF 30-35% with diffuse HK and moderate MR; c. 07/2017 Echo: EF 30-35%, diff hK. Mild MR, mildly dil LA. PASP 88mmHg.   Diverticulitis    Diverticulitis of large intestine with perforation without abscess or bleeding 05/13/2017   Hypertension    Hyperthyroidism    NICM (nonischemic cardiomyopathy) ([place])    Noncompliance    Persistent atrial fibrillation (HCC)    a. CHA2DS2VASc = 3-->Eliquis (? compliance).    Medications:  No history of chronic anticoagulant use PTA  Assessment: 72yo male with PMH of HFrEF secondary to ischemic cardiomyopathy, persistent atrial fibrillation complicated by medication noncompliance, throacic and abdominal aortic anuerysms, GI bleed in 2021 prompting discontinuation of apixaban who presented to the  ED with chest pain and shortness of breath x 3 days. Pharmacy has been consulted to initiate and titrate heparin infusion while admitted.  Baseline labs: aPTT 31s, INR 1.0, Plts 169, Hgb 13.6  Goal of Therapy:  Heparin level 0.3-0.7 units/ml Monitor platelets by anticoagulation protocol: Yes   Plan:  ---IV access was lost, therefore heparin was paused for approximately 1 1/2 hours and restarted at  1850 units/hr at approximately 1000 --Recheck anti-Xa level in 8 hours after restart --Continue to monitor H&H and platelets  Vallery Sa, PharmD, BCPS 06/16/2022 8:36 AM

## 2022-06-16 NOTE — Progress Notes (Signed)
PROGRESS NOTE  Eugene Henderson    DOB: March 08, 1951, 72 y.o.  WCH:[id number]    Code Status: Full Code   DOA: 06/14/2022   LOS: 2   Brief hospital course  Eugene Henderson is a 72 y.o. male with a PMH significant for hypertension, hyperlipidemia, non-insulin-dependent diabetes mellitus.  They presented from home to the ED on 06/14/2022 with chest pain and shortness of breath x 3 days. Current smoker.  In the ED, it was found that they had temperature of 97.9, respiration rate of 20, heart rate of 126, blood pressure 145/96, SpO2 of 97% on room air.  Significant findings included Serum sodium is 133, potassium 3.7, chloride 101, bicarb 21, BUN of 14, serum creatinine of 0.76, EGFR greater than 60, nonfasting blood glucose 113, WBC 5.4, hemoglobin 13.7, platelets of 159. BNP was elevated at 575.5. High sensitive troponin was 25 and on repeat was 28. COVID/influenza A/influenza B/RSV PCR were negative.  They were initially treated with Maalox PO, aspirin 324 mg p.o. one-time dose, furosemide 40 mg IV, Dilaudid 0.5 mg IV, metoprolol 5 mg IV x 2, sublingual nitroglycerin 0.4 mg one-time dose ordered.   1/9- choledocholithiasis was ruled out. Pursuing alternate causes of pain which includes PUD, CHF exacerbation, aortic aneurysms. Cardiology and vascular surgery consulted. GI signed off and recommended PPI and OP follow up.   06/16/22 -overnight event of delirium. Resolved with haldol  Assessment & Plan  Principal Problem:   Abdominal pain Active Problems:   Persistent atrial fibrillation (HCC)   Essential hypertension   Tobacco use   Chest pain   Diabetes mellitus without complication (HCC)   Morbid obesity (HCC)   AF (paroxysmal atrial fibrillation) (HCC)   Hyperlipidemia   Metal foreign body in head   Elevated bilirubin   Atrial fibrillation with RVR (HCC)   Thoracoabdominal aortic aneurysm (TAAA) (HCC)  Abdominal pain- negative workup for choledocholithiasis. DDX remains open to PUD,  acalculous cholecystitis, hypervolemia, aortic aneurysm. Pain is improved today.  - GI has signed off and recommends OP follow up - analgesia PRN - GI cocktail - continue PPI - strict I/O - discontinue anaerobic coverage - trend LFTs   Acute hypoxic respiratory failure- requiring 2L O2nc. 2/2 hypervolemia - wean as tolerated.   HFrEF- echo 2022 revealed EF 30%. Repeat echo today shows worsening function. EF 20-25%.  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 Abdomen CTA revealed several abnormalities 1/8 - vascular surgery consulted. Planning graft/stent - will need cardiac optimization first  Delirium- event overnight 1/9 - CIWA monitoring - xanax PRN - seroquel nightly   Metal foreign body in head- non acute issue   Hyperlipidemia - Atorvastatin resumed   Morbid obesity (HCC) - This meets criteria for morbid obesity based on the presence of 1 or more chronic comorbidities. Patient has hypertension. This complicates overall care and prognosis.    Essential hypertension - Irbesartan 150 mg daily, metoprolol succinate 100 mg daily, spironolactone 25 mg daily resumed   Persistent atrial fibrillation (HCC) - Resumed home metoprolol succinate 100 mg daily  Body mass index is 37.17 kg/m.  VTE ppx: Place TED hose Start: 06/14/22 1835  Diet:     Diet   Diet Heart Room service appropriate? Yes; Fluid consistency: Thin; Fluid restriction: 1200 mL Fluid   Consultants: GI Cardiology Vascular surgery  Subjective 06/16/22  Pt reports feeling improved s/p pain medication. Denies SOB, CP. Falls asleep when not actively engaged. Likely due to several sedating medications overnight/this morning. Does not remember his agitated episode overnight. Currently cooperative.    Objective   Vitals:    06/16/22 0019 06/16/22 0441 06/16/22 0800 06/16/22 0957  BP: 90/68 (!) 132/99 (!) 128/94 113/88  Pulse:  93 (!) 108 96  Resp:  20 (!) 21   Temp:  97.7 F (36.5 C) (!) 97.5 F (36.4 C)   TempSrc:  Oral    SpO2:  98% 99% 97%  Weight:      Height:        Intake/Output Summary (Last 24 hours) at 06/16/2022 1047 Last data filed at 06/15/2022 2011 Gross per 24 hour  Intake 241.09 ml  Output 350 ml  Net -108.91 ml   Filed Weights   06/14/22 1222 06/15/22 0238  Weight: 122.5 kg 124.3 kg     Physical Exam:  General: awake, alert, NAD HEENT: atraumatic, clear conjunctiva, anicteric sclera, MMM, hearing grossly normal Respiratory: normal respiratory effort. Cardiovascular: quick capillary refill, normal S1/S2, RRR, no JVD, murmurs Gastrointestinal: soft, no tenderness to RUQ and epigastric and periumbilical area Nervous: A&O x2. Falls asleep when not actively being engaged. speech difficult to understand. Answers questions appropriately.  Extremities: moves all equally, 2+ pitting edema LE Skin: dry, intact, normal temperature, normal color. No rashes, lesions or ulcers on exposed skin Psychiatry: normal mood, congruent affect  Labs   I have personally reviewed the following labs and imaging studies CBC    Component Value Date/Time   WBC 5.8 06/16/2022 0447   RBC 4.58 06/16/2022 0447   HGB 13.6 06/16/2022 0447   HGB 14.3 06/18/2021 1409   HCT 41.4 06/16/2022 0447   HCT 42.1 06/18/2021 1409   PLT 148 (L) 06/16/2022 0447   PLT 154 06/18/2021 1409   MCV 90.4 06/16/2022 0447   MCV 86 06/18/2021 1409   MCH 29.7 06/16/2022 0447   MCHC 32.9 06/16/2022 0447   RDW 14.8 06/16/2022 0447   RDW 15.1 06/18/2021 1409   LYMPHSABS 1.3 06/18/2021 1409   MONOABS 0.7 05/14/2017 0351   EOSABS 0.1 06/18/2021 1409   BASOSABS 0.0 06/18/2021 1409      Latest Ref Rng & Units 06/16/2022    4:47 AM 06/15/2022    3:04 AM 06/14/2022   12:24 PM  BMP  Glucose 70 - 99 mg/dL 88  134  113   BUN 8 - 23  mg/dL 34  21  14   Creatinine 0.61 - 1.24 mg/dL 1.17  1.00  0.76   Sodium 135 - 145 mmol/L 135  134  133   Potassium 3.5 - 5.1 mmol/L 4.0  4.1  3.7   Chloride 98 - 111 mmol/L 98  99  101   CO2 22 - 32 mmol/L 26  23  21    Calcium 8.9 - 10.3 mg/dL 8.7  9.0  9.1     ECHOCARDIOGRAM COMPLETE  Result Date: 06/16/2022    ECHOCARDIOGRAM REPORT   Patient Name:   SHAHAN STARKS Date of Exam: 06/15/2022 Medical Rec #:  [id number]      Height:       72.0 in Accession #:    [id number]     Weight:       274.0 lb Date of Birth:  06-Oct-1950      BSA:          2.436 m Patient Age:    28 years  BP:           137/108 mmHg Patient Gender: M              HR:           108 bpm. Exam Location:  [facility] Procedure: 2D Echo, Cardiac Doppler and Color Doppler Indications:     I50.21 Acute Systolic CHF  History:         Patient has prior history of Echocardiogram examinations, most                  recent 03/13/2021. Arrythmias:Atrial Fibrillation; Risk                  Factors:Hypertension. Ascending Aortic Aneurysm. Chronic                  systolic heart failure. Nonischemic cardiomyopathy.  Sonographer:     Daphine Deutscher RDCS Referring Phys:  [id number] Raymon Mutton DUNN Diagnosing Phys: Yvonne Kendall MD IMPRESSIONS  1. Left ventricular ejection fraction, by estimation, is 20 to 25%. The left ventricle has severely decreased function. The left ventricle demonstrates global hypokinesis. The left ventricular internal cavity size was severely dilated. There is mild left ventricular hypertrophy. Left ventricular diastolic function could not be evaluated.  2. Right ventricular systolic function is severely reduced. The right ventricular size is mildly enlarged. There is moderately elevated pulmonary artery systolic pressure.  3. Left atrial size was severely dilated.  4. Right atrial size was severely dilated.  5. The mitral valve is normal in structure. Moderate mitral valve regurgitation.  6. Tricuspid valve regurgitation is mild to  moderate.  7. The aortic valve is tricuspid. Aortic valve regurgitation is trivial. No aortic stenosis is present.  8. Aortic dilatation noted. 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  9. The inferior vena cava is dilated in size with <50% respiratory variability, suggesting right atrial pressure of 15 mmHg. FINDINGS  Left Ventricle: Left ventricular ejection fraction, by estimation, is 20 to 25%. The left ventricle has severely decreased function. The left ventricle demonstrates global hypokinesis. The left ventricular internal cavity size was severely dilated. There is mild left ventricular hypertrophy. Left ventricular diastolic function could not be evaluated due to atrial fibrillation. Left ventricular diastolic function could not be evaluated. Right Ventricle: The right ventricular size is mildly enlarged. No increase in right ventricular wall thickness. Right ventricular systolic function is severely reduced. There is moderately elevated pulmonary artery systolic pressure. The tricuspid regurgitant velocity is 2.95 m/s, and with an assumed right atrial pressure of 15 mmHg, the estimated right ventricular systolic pressure is 49.8 mmHg. Left Atrium: Left atrial size was severely dilated. Right Atrium: Right atrial size was severely dilated. Pericardium: There is no evidence of pericardial effusion. Mitral Valve: The mitral valve is normal in structure. Moderate mitral valve regurgitation. Tricuspid Valve: The tricuspid valve is normal in structure. Tricuspid valve regurgitation is mild to moderate. Aortic Valve: The aortic valve is tricuspid. Aortic valve regurgitation is trivial. No aortic stenosis is present. Pulmonic Valve: The pulmonic valve was normal in structure. Pulmonic valve regurgitation is trivial. No evidence of pulmonic stenosis. Aorta: Aortic dilatation noted. 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 There is severe dilatation of the ascending aorta, measuring 50 mm. Pulmonary Artery: The pulmonary artery is not well seen. Venous: The inferior vena cava is dilated in size with less  than 50% respiratory variability, suggesting right atrial pressure of 15 mmHg. IAS/Shunts: No atrial level shunt detected by color flow Doppler.  LEFT VENTRICLE PLAX 2D LVIDd:         6.50 cm LVIDs:         5.10 cm LV PW:         1.00 cm LV IVS:        1.00 cm LVOT diam:     2.80 cm LV SV:         44 LV SV Index:   18 LVOT Area:     6.16 cm  RIGHT VENTRICLE             IVC RV Basal diam:  4.60 cm     IVC diam: 3.20 cm RV S prime:     11.27 cm/s LEFT ATRIUM            Index        RIGHT ATRIUM           Index LA diam:      6.20 cm  2.55 cm/m   RA Area:     41.10 cm LA Vol (A2C): 207.0 ml 84.99 ml/m  RA Volume:   171.00 ml 70.21 ml/m LA Vol (A4C): 102.0 ml 41.88 ml/m  AORTIC VALVE LVOT Vmax:   48.90 cm/s LVOT Vmean:  31.460 cm/s LVOT VTI:    0.072 m  AORTA Ao Root diam: 5.27 cm Ao Asc diam:  5.00 cm MITRAL VALVE               TRICUSPID VALVE MV Area (PHT): 5.66 cm    TR Peak grad:   34.8 mmHg MV Decel Time: 134 msec    TR Vmax:        295.00 cm/s MV E velocity: 67.10 cm/s MV A velocity: 41.90 cm/s  SHUNTS MV E/A ratio:  1.60        Systemic VTI:  0.07 m                            Systemic Diam: 2.80 cm Yvonne Kendall MD Electronically signed by Yvonne Kendall MD Signature Date/Time: 06/16/2022/7:15:53 AM    Final    MR ABDOMEN MRCP WO CONTRAST  Result Date: 06/15/2022 CLINICAL DATA:  72 year old male with history of right upper quadrant abdominal pain. Suspected biliary disease. EXAM: MRI ABDOMEN WITHOUT CONTRAST  (INCLUDING MRCP) TECHNIQUE: Multiplanar multisequence MR imaging of the abdomen was performed. Heavily T2-weighted images of the biliary and pancreatic ducts were obtained, and three-dimensional MRCP images were rendered by post processing. COMPARISON:  No priors. FINDINGS: Comment: Today's  study is limited by artifact from substantial patient respiratory motion. Additionally, this examination is limited for detection and characterization of visceral and/or vascular lesions by lack of IV gadolinium. Lower chest: Cardiomegaly. Hepatobiliary: Mild diffuse loss of signal intensity throughout the hepatic parenchyma on out of phase dual echo images, indicative of a background of mild hepatic steatosis. In the superior aspect of segment 8 of the liver there is a 11 mm T1 hypointense, T2 hyperintense lesion which is incompletely characterized in the absence of IV gadolinium, but statistically likely a cyst. No other definite suspicious hepatic lesions are noted. MRCP images are nearly nondiagnostic, but demonstrate no intra or extrahepatic biliary ductal dilatation. Common bile duct measures 4 mm in the porta hepatis. No definite filling defects within the common bile duct to suggest choledocholithiasis. There is some amorphous  material that is slightly high T1 signal intensity and low T2 signal intensity lying dependently in the gallbladder, likely biliary sludge. Gallbladder wall appears likely mildly thickened and edematous, with trace volume of pericholecystic fluid (poorly evaluated given motion limitations of today's examination). Pancreas: No definite pancreatic mass identified on today's noncontrast examination. No pancreatic ductal dilatation. Small amount of T2 hyperintensity surrounding the head and uncinate process of the pancreas, as well as the adjacent duodenum. Body and tail of the pancreas are grossly unremarkable in appearance. Spleen:  Unremarkable. Adrenals/Urinary Tract: Bilateral kidneys and adrenal glands are unremarkable in appearance. No hydroureteronephrosis in the visualized portions of the abdomen. Stomach/Bowel: Increased T2 signal intensity surrounding portions of the duodenum. Visualized portions of stomach, small bowel and colon are otherwise unremarkable. Vascular/Lymphatic:  Aortic atherosclerosis with aneurysmal dilatation of the suprarenal abdominal aorta which measures up to 4 cm in diameter at the level of the celiac axis origin. No definite lymphadenopathy noted in the abdomen. Other: Small amount of T2 hyperintensity adjacent to the head of the pancreas extending upwards into the hepatoduodenal ligament, and extending slightly caudally in the adjacent portions of the retroperitoneum. No substantial volume of ascites. Musculoskeletal: Visualized portions are grossly unremarkable. IMPRESSION: 1. Limited study demonstrating biliary sludge in the gallbladder. Gallbladder wall appears thickened and edematous with pericholecystic fluid. There is also fluid adjacent to the head of the pancreas and the duodenal. Whether this reflects an acute cholecystitis or secondary to underlying pancreatitis is uncertain on the basis of today's examination. 2. No definite evidence of choledocholithiasis. No signs of biliary tract obstruction. 3. Mild hepatic steatosis. 4. Aortic atherosclerosis with suprarenal abdominal aortic aneurysm measuring up to 4.0 cm in diameter. Recommend follow-up every 12 months and vascular consultation. This recommendation follows ACR consensus guidelines: White Paper of the ACR Incidental Findings Committee II on Vascular Findings. J Am Coll Radiol 2013; 10:789-794. Electronically Signed   By: Vinnie Langton M.D.   On: 06/15/2022 05:26   MR 3D Recon At Scanner  Result Date: 06/15/2022 CLINICAL DATA:  72 year old male with history of right upper quadrant abdominal pain. Suspected biliary disease. EXAM: MRI ABDOMEN WITHOUT CONTRAST  (INCLUDING MRCP) TECHNIQUE: Multiplanar multisequence MR imaging of the abdomen was performed. Heavily T2-weighted images of the biliary and pancreatic ducts were obtained, and three-dimensional MRCP images were rendered by post processing. COMPARISON:  No priors. FINDINGS: Comment: Today's study is limited by artifact from substantial  patient respiratory motion. Additionally, this examination is limited for detection and characterization of visceral and/or vascular lesions by lack of IV gadolinium. Lower chest: Cardiomegaly. Hepatobiliary: Mild diffuse loss of signal intensity throughout the hepatic parenchyma on out of phase dual echo images, indicative of a background of mild hepatic steatosis. In the superior aspect of segment 8 of the liver there is a 11 mm T1 hypointense, T2 hyperintense lesion which is incompletely characterized in the absence of IV gadolinium, but statistically likely a cyst. No other definite suspicious hepatic lesions are noted. MRCP images are nearly nondiagnostic, but demonstrate no intra or extrahepatic biliary ductal dilatation. Common bile duct measures 4 mm in the porta hepatis. No definite filling defects within the common bile duct to suggest choledocholithiasis. There is some amorphous material that is slightly high T1 signal intensity and low T2 signal intensity lying dependently in the gallbladder, likely biliary sludge. Gallbladder wall appears likely mildly thickened and edematous, with trace volume of pericholecystic fluid (poorly evaluated given motion limitations of today's examination). Pancreas: No definite pancreatic mass identified  on today's noncontrast examination. No pancreatic ductal dilatation. Small amount of T2 hyperintensity surrounding the head and uncinate process of the pancreas, as well as the adjacent duodenum. Body and tail of the pancreas are grossly unremarkable in appearance. Spleen:  Unremarkable. Adrenals/Urinary Tract: Bilateral kidneys and adrenal glands are unremarkable in appearance. No hydroureteronephrosis in the visualized portions of the abdomen. Stomach/Bowel: Increased T2 signal intensity surrounding portions of the duodenum. Visualized portions of stomach, small bowel and colon are otherwise unremarkable. Vascular/Lymphatic: Aortic atherosclerosis with aneurysmal  dilatation of the suprarenal abdominal aorta which measures up to 4 cm in diameter at the level of the celiac axis origin. No definite lymphadenopathy noted in the abdomen. Other: Small amount of T2 hyperintensity adjacent to the head of the pancreas extending upwards into the hepatoduodenal ligament, and extending slightly caudally in the adjacent portions of the retroperitoneum. No substantial volume of ascites. Musculoskeletal: Visualized portions are grossly unremarkable. IMPRESSION: 1. Limited study demonstrating biliary sludge in the gallbladder. Gallbladder wall appears thickened and edematous with pericholecystic fluid. There is also fluid adjacent to the head of the pancreas and the duodenal. Whether this reflects an acute cholecystitis or secondary to underlying pancreatitis is uncertain on the basis of today's examination. 2. No definite evidence of choledocholithiasis. No signs of biliary tract obstruction. 3. Mild hepatic steatosis. 4. Aortic atherosclerosis with suprarenal abdominal aortic aneurysm measuring up to 4.0 cm in diameter. Recommend follow-up every 12 months and vascular consultation. This recommendation follows ACR consensus guidelines: White Paper of the ACR Incidental Findings Committee II on Vascular Findings. J Am Coll Radiol 2013; 10:789-794. Electronically Signed   By: Trudie Reedaniel  Entrikin M.D.   On: 06/15/2022 05:26   DG Cervical Spine 2 or 3 views  Result Date: 06/14/2022 CLINICAL DATA:  MRI clearance EXAM: CERVICAL SPINE - 2-3 VIEW COMPARISON:  None Available. FINDINGS: There are multiple metallic shrapnel remnants noted abutting the right occipital bone measuring up to 10 mm in greatest dimension. Visualized calvarium is intact. Mild degenerative changes are seen within the cervical spine. IMPRESSION: 1. Multiple metallic shrapnel remnants abutting the right occipital bone. Electronically Signed   By: Helyn NumbersAshesh  Parikh M.D.   On: 06/14/2022 22:32   US Abdomen Limited RUQ  (LIVER/GB)  Result Date: 06/14/2022 CLINICAL DATA:  Follow-up possible abnormal gallbladder noted on a current CTA chest, abdomen and pelvis. Patient reportedly with chest pain, belching and left-sided abdominal pain. EXAM: ULTRASOUND ABDOMEN LIMITED RIGHT UPPER QUADRANT COMPARISON:  None Available. FINDINGS: Gallbladder: Heterogeneous echogenicity material lies along the wall adjacent to the liver, overall measuring approximately 4 x 1.2 x 2.5 cm. Wall appears thickened, up to 8 mm, although some of this measurement may be including pericholecystic fat. No pericholecystic fluid. No shadowing stone. No sonographic Murphy's sign. Common bile duct: Diameter: 2 mm Liver: Slight nodular contour. Mild increased parenchymal echogenicity. No mass. Portal vein is patent on color Doppler imaging with normal direction of blood flow towards the liver. Other: None. IMPRESSION: 1. Abnormal appearance of the gallbladder. There is apparent material within the gallbladder, non mobile, along the wall adjacent to the liver. This could potentially reflect focal wall edema due to adjacent liver pathology. It could reflect adherent sludge. This material contains internal cystic areas which argues against neoplasm, although neoplasm not fully excluded. Findings could be further assessed, when the patient can better tolerate the procedure, with liver MRI without and with contrast including MRCP. 2. No pericholecystic fluid, no stones and no sonographic Murphy's sign to indicate acute cholecystitis.  3. Subtle liver surface nodularity. Consider cirrhosis. Increased liver parenchymal echogenicity consistent with hepatic steatosis. Electronically Signed   By: Amie Portland M.D.   On: 06/14/2022 17:35   CT Angio Chest/Abd/Pel for Dissection W and/or Wo Contrast  Result Date: 06/14/2022 CLINICAL DATA:  Chest pain. Loss of belching. Left-sided abdominal pain. Concern for acute aortic syndrome. EXAM: CT ANGIOGRAPHY CHEST, ABDOMEN AND PELVIS  TECHNIQUE: Non-contrast CT of the chest was initially obtained. Multidetector CT imaging through the chest, abdomen and pelvis was performed using the standard protocol during bolus administration of intravenous contrast. Multiplanar reconstructed images and MIPs were obtained and reviewed to evaluate the vascular anatomy. RADIATION DOSE REDUCTION: This exam was performed according to the departmental dose-optimization program which includes automated exposure control, adjustment of the mA and/or kV according to patient size and/or use of iterative reconstruction technique. CONTRAST:  OMNIPAQUE IOHEXOL 350 MG/ML SOLN COMPARISON:  08/04/2021. FINDINGS: CTA CHEST FINDINGS Cardiovascular: Mild-to-moderate cardiac enlargement. Trace pericardial effusion. Three-vessel coronary artery calcifications. Ascending thoracic aorta measures 4.7 cm in diameter. No aortic dissection. Mild aortic atherosclerosis mostly along the mid to lower descending portion. Arch branch vessels are widely patent. Mediastinum/Nodes: No neck base, mediastinal or hilar masses. Scattered prominent lymph nodes. Ascus level right paratracheal node, 1.1 cm short axis. Subcarinal node, 1.5 cm short axis. Trachea and esophagus are unremarkable. Lungs/Pleura: Mild centrilobular emphysema. Minor linear subsegmental atelectasis, dependent right middle lobe, base of the left upper lobe lingula. No evidence of pneumonia or pulmonary edema. No pleural effusion or pneumothorax. Musculoskeletal: No fracture or acute finding. No chest wall mass. Bilateral gynecomastia. Review of the MIP images confirms the above findings. CTA ABDOMEN AND PELVIS FINDINGS VASCULAR Aorta: Focal fusiform dilation of the proximal aorta, at and just below the level of the renal arteries, maximum diameter of 3.8 cm, associated with eccentric atherosclerotic plaque and mural thrombus, unchanged compared to the prior CT. Moderate atherosclerotic plaque, partly calcified. No  dissection. Infrarenal portion dilated to 3 cm. Celiac: Plaque at the origin. No significant stenosis. No aneurysm or dissection. SMA: Mild plaque at the origin. No significant stenosis. No aneurysm or dissection. Renals: Both renal arteries are patent without evidence of aneurysm, dissection, vasculitis, fibromuscular dysplasia or significant stenosis. IMA: Faintly identified, small but patent IMA. Inflow: Minimally opacified common and proximal external iliac vessels. More distally these are not opacified. 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 This is similar to the prior CT. Plaque and narrowing of these vessels cannot be accurately assessed due to limited contrast enhancement. No convincing change from the prior CT, however. Veins: No obvious venous abnormality within the limitations of this arterial phase study. Review of the MIP images confirms the above findings. NON-VASCULAR Hepatobiliary: Overall relative decreased liver attenuation. Subtle nodularity suggested, assessment somewhat limited by patient motion. Liver normal in overall size. No defined mass. Gallbladder margins are indistinct. Haziness in the adjacent fat extending to abut the first and second portions of the duodenum. No defined stone. No convincing duct dilation. Pancreas: Partial fatty replacement of the pancreas. No mass or duct dilation. No convincing inflammation. Spleen: Normal in size without focal abnormality. Adrenals/Urinary Tract: No adrenal masses. No renal masses, stones or hydronephrosis. Normal ureters. Bladder unremarkable. Stomach/Bowel: Normal stomach. Small bowel and colon are normal in caliber. No wall thickening. No inflammation. Scattered colonic diverticula. Lymphatic: No enlarged lymph nodes. Reproductive: Unremarkable. Other: Anterior hernia mesh. Minimal fat containing umbilical hernia. Appear stable from the prior CT. No ascites. Musculoskeletal: No fracture  or acute finding. No bone lesion.  Degenerative changes throughout the lumbar spine. Advanced arthropathic changes of the left hip. Review of the MIP images confirms the above findings. IMPRESSION: CTA 1. No acute findings. No aortic dissection, intramural hematoma or convincing penetrating ulcer. No change when compared to the CT from 08/04/2021. 2. Dilated ascending thoracic aorta, prominent descending portion, also stable. Current maximum dimension of the ascending portion, 4.9 cm at the sino-tubular junction, 4.8 cm of the ascending portion at the level of the main pulmonary artery. Ascending thoracic aortic aneurysm. Recommend semi-annual imaging followup by CTA or MRA and referral to cardiothoracic surgery if not already obtained. This recommendation follows 2010 ACCF/AHA/AATS/ACR/ASA/SCA/SCAI/SIR/STS/SVM Guidelines for the Diagnosis and Management of Patients With Thoracic Aortic Disease. Circulation. 2010; 121: Y637-C588. Aortic aneurysm NOS (ICD10-I71.9) 3. Focal dilation of the proximal abdominal aorta to 3.8 cm associated with eccentric mural thrombus, unchanged from the prior CT. Infrarenal portion dilated two 3 cm. 4. 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 NON CTA 1. Gallbladder distended with an indistinct wall an adjacent hazy fat infiltration. Consider acute cholecystitis in the proper clinical setting, which could be further assessed with limited right upper quadrant ultrasound. No visualized stones. 2. No other evidence of an acute abnormality within the chest, abdomen or pelvis. 3. Multiple chronic findings as detailed above stable compared to the prior CT. Electronically Signed   By: Amie Portland M.D.   On: 06/14/2022 16:09   DG Chest 2 View  Result Date: 06/14/2022 CLINICAL DATA:  Chest pain and shortness of breath EXAM: CHEST - 2 VIEW COMPARISON:  CTA chest dated 08/04/2021, chest radiograph dated 08/24/2019 FINDINGS: Normal lung volumes. No focal consolidations. No pleural effusion or pneumothorax. Enlarged  cardiomediastinal silhouette. The visualized skeletal structures are unremarkable. IMPRESSION: 1. No active cardiopulmonary disease. 2. Cardiomegaly. Electronically Signed   By: Agustin Cree M.D.   On: 06/14/2022 13:17    Disposition Plan & Communication  Patient status: Inpatient  Admitted From: Home Planned disposition location: Home Anticipated discharge date: 1/13 pending vascular procedure and treatment of HF  Family Communication: none    Author: Leeroy Bock, DO [facility] 06/16/2022, 10:47 AM   Available by Epic secure chat 7AM-7PM. If 7PM-7AM, please contact night-coverage.  [facility] contact information found on [email or web address].

## 2022-06-16 NOTE — Progress Notes (Signed)
       CROSS COVER NOTE  NAME: Eugene Henderson MRN: [id number] DOB : 04-04-1951 ATTENDING PHYSICIAN: Richarda Osmond, MD    Date of Service   06/16/2022   HPI/Events of Note   Message received from RN reporting Eugene Henderson is combative, hitting and kicking staff, punched the wall/window attempting to escape, verbally threatened staff, and threw IV pump. RN concerned this could be paradoxical reaction after she gave IV Ativan when patient reported being anxious earlier tonight.  Eugene Henderson examined at bedside after receiving Haldol. At this time he is calm and cooperative. No focal deficits noted on my exam. 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    Interventions   Assessment/Plan:  Haldol x1 Consider CT Head if patient remains confused      To reach the provider On-Call:   7AM- 7PM see care teams to locate the attending and reach out to them via [email or web address]. 7PM-7AM contact night-coverage If you still have difficulty reaching the appropriate provider, please page the [facility] (Director on Call) for [facility] on amion for assistance  This document was prepared using Set designer software and may include unintentional dictation errors.  Neomia Glass DNP, MBA, FNP-BC Nurse Practitioner [facility] Pager [phone]

## 2022-06-17 ENCOUNTER — Inpatient Hospital Stay: Payer: Medicare HMO | Admitting: General Practice

## 2022-06-17 ENCOUNTER — Encounter
Admission: EM | Disposition: A | Discharge status: Home / Self Care | Payer: Self-pay | Source: Home / Self Care | Attending: Internal Medicine

## 2022-06-17 ENCOUNTER — Other Ambulatory Visit (HOSPITAL_COMMUNITY): Payer: Self-pay

## 2022-06-17 DIAGNOSIS — I4891 Unspecified atrial fibrillation: Secondary | ICD-10-CM | POA: Diagnosis not present

## 2022-06-17 DIAGNOSIS — I743 Embolism and thrombosis of arteries of the lower extremities: Secondary | ICD-10-CM

## 2022-06-17 DIAGNOSIS — R079 Chest pain, unspecified: Secondary | ICD-10-CM | POA: Diagnosis not present

## 2022-06-17 DIAGNOSIS — I5020 Unspecified systolic (congestive) heart failure: Secondary | ICD-10-CM | POA: Diagnosis not present

## 2022-06-17 DIAGNOSIS — I723 Aneurysm of iliac artery: Secondary | ICD-10-CM

## 2022-06-17 DIAGNOSIS — I714 Abdominal aortic aneurysm, without rupture, unspecified: Secondary | ICD-10-CM | POA: Diagnosis not present

## 2022-06-17 HISTORY — PX: ENDOVASCULAR STENT GRAFT (AAA): CATH118280

## 2022-06-17 LAB — CBC
HCT: 39.3 % (ref 39.0–52.0)
HCT: 39.5 % (ref 39.0–52.0)
Hemoglobin: 13.1 g/dL (ref 13.0–17.0)
Hemoglobin: 12.8 g/dL — ABNORMAL LOW (ref 13.0–17.0)
MCH: 29 pg (ref 26.0–34.0)
MCH: 28.6 pg (ref 26.0–34.0)
MCHC: 33.3 g/dL (ref 30.0–36.0)
MCHC: 32.4 g/dL (ref 30.0–36.0)
MCV: 86.9 fL (ref 80.0–100.0)
MCV: 88.4 fL (ref 80.0–100.0)
Platelets: 142 10*3/uL — ABNORMAL LOW (ref 150–400)
Platelets: 135 10*3/uL — ABNORMAL LOW (ref 150–400)
RBC: 4.52 MIL/uL (ref 4.22–5.81)
RBC: 4.47 MIL/uL (ref 4.22–5.81)
RDW: 14.3 % (ref 11.5–15.5)
RDW: 14.6 % (ref 11.5–15.5)
WBC: 5.5 10*3/uL (ref 4.0–10.5)
WBC: 5 10*3/uL (ref 4.0–10.5)
nRBC: 0 % (ref 0.0–0.2)
nRBC: 0 % (ref 0.0–0.2)

## 2022-06-17 LAB — BASIC METABOLIC PANEL
Anion gap: 10 (ref 5–15)
BUN: 22 mg/dL (ref 8–23)
CO2: 27 mmol/L (ref 22–32)
Calcium: 7.9 mg/dL — ABNORMAL LOW (ref 8.9–10.3)
Chloride: 98 mmol/L (ref 98–111)
Creatinine, Ser: 0.87 mg/dL (ref 0.61–1.24)
GFR, Estimated: >60 mL/min (ref >60)
Glucose, Bld: 115 mg/dL — ABNORMAL HIGH (ref 70–99)
Potassium: 3.5 mmol/L (ref 3.5–5.1)
Sodium: 135 mmol/L (ref 135–145)

## 2022-06-17 LAB — COMPREHENSIVE METABOLIC PANEL
ALT: 164 U/L — ABNORMAL HIGH (ref 0–44)
AST: 197 U/L — ABNORMAL HIGH (ref 15–41)
Albumin: 3.8 g/dL (ref 3.5–5.0)
Alkaline Phosphatase: 69 U/L (ref 38–126)
Anion gap: 10 (ref 5–15)
BUN: 33 mg/dL — ABNORMAL HIGH (ref 8–23)
CO2: 23 mmol/L (ref 22–32)
Calcium: 8.2 mg/dL — ABNORMAL LOW (ref 8.9–10.3)
Chloride: 97 mmol/L — ABNORMAL LOW (ref 98–111)
Creatinine, Ser: 0.96 mg/dL (ref 0.61–1.24)
GFR, Estimated: >60 mL/min (ref >60)
Glucose, Bld: 102 mg/dL — ABNORMAL HIGH (ref 70–99)
Potassium: 3.7 mmol/L (ref 3.5–5.1)
Sodium: 130 mmol/L — ABNORMAL LOW (ref 135–145)
Total Bilirubin: 1.7 mg/dL — ABNORMAL HIGH (ref 0.3–1.2)
Total Protein: 7.1 g/dL (ref 6.5–8.1)

## 2022-06-17 LAB — MRSA NEXT GEN BY PCR, NASAL: MRSA by PCR Next Gen: NOT DETECTED

## 2022-06-17 LAB — GLUCOSE, CAPILLARY
Glucose-Capillary: 91 mg/dL (ref 70–99)
Glucose-Capillary: 82 mg/dL (ref 70–99)

## 2022-06-17 LAB — PROTIME-INR
INR: 1.3 — ABNORMAL HIGH (ref 0.8–1.2)
Prothrombin Time: 16.2 seconds — ABNORMAL HIGH (ref 11.4–15.2)

## 2022-06-17 LAB — HEPARIN LEVEL (UNFRACTIONATED): Heparin Unfractionated: 0.6 IU/mL (ref 0.30–0.70)

## 2022-06-17 LAB — APTT: aPTT: 181 seconds (ref 24–36)

## 2022-06-17 SURGERY — ENDOVASCULAR STENT GRAFT (AAA)
Anesthesia: General

## 2022-06-17 MED ORDER — CHLORHEXIDINE GLUCONATE CLOTH 2 % EX PADS
6.0000 | MEDICATED_PAD | Freq: Every day | CUTANEOUS | Status: DC
  Administered 2022-06-17 – 2022-06-25 (×9): 6 via TOPICAL

## 2022-06-17 MED ORDER — HYDROMORPHONE HCL 1 MG/ML IJ SOLN
INTRAMUSCULAR | Status: AC
  Filled 2022-06-17: qty 1

## 2022-06-17 MED ORDER — ESMOLOL HCL 100 MG/10ML IV SOLN
INTRAVENOUS | Status: DC | PRN
  Administered 2022-06-17 (×3): 10 mg via INTRAVENOUS

## 2022-06-17 MED ORDER — METOPROLOL TARTRATE 5 MG/5ML IV SOLN: 2.0000 mg | INTRAVENOUS | Status: DC | PRN

## 2022-06-17 MED ORDER — SUGAMMADEX SODIUM 500 MG/5ML IV SOLN
INTRAVENOUS | Status: DC | PRN
  Administered 2022-06-17: 240 mg via INTRAVENOUS

## 2022-06-17 MED ORDER — FENTANYL CITRATE (PF) 100 MCG/2ML IJ SOLN
INTRAMUSCULAR | Status: DC | PRN
  Administered 2022-06-17 (×2): 50 ug via INTRAVENOUS

## 2022-06-17 MED ORDER — POLYETHYLENE GLYCOL 3350 17 G PO PACK: 17.0000 g | PACK | Freq: Every day | ORAL | Status: DC | PRN

## 2022-06-17 MED ORDER — OXYCODONE-ACETAMINOPHEN 5-325 MG PO TABS
1.0000 | ORAL_TABLET | ORAL | Status: DC | PRN
  Administered 2022-06-17: 2 via ORAL
  Filled 2022-06-17: qty 2

## 2022-06-17 MED ORDER — MIDAZOLAM HCL 2 MG/ML PO SYRP: 8.0000 mg | ORAL_SOLUTION | Freq: Once | ORAL | Status: DC | PRN

## 2022-06-17 MED ORDER — HYDRALAZINE HCL 20 MG/ML IJ SOLN: 5.0000 mg | INTRAMUSCULAR | Status: DC | PRN

## 2022-06-17 MED ORDER — SODIUM CHLORIDE 0.9 % IV SOLN: INTRAVENOUS | Status: DC

## 2022-06-17 MED ORDER — FENTANYL CITRATE PF 50 MCG/ML IJ SOSY: 12.5000 ug | PREFILLED_SYRINGE | Freq: Once | INTRAMUSCULAR | Status: DC | PRN

## 2022-06-17 MED ORDER — NITROGLYCERIN IN D5W 200-5 MCG/ML-% IV SOLN: 5.0000 ug/min | INTRAVENOUS | Status: DC

## 2022-06-17 MED ORDER — POTASSIUM CHLORIDE CRYS ER 20 MEQ PO TBCR
40.0000 meq | EXTENDED_RELEASE_TABLET | Freq: Two times a day (BID) | ORAL | Status: DC
  Administered 2022-06-17: 40 meq via ORAL
  Filled 2022-06-17: qty 2

## 2022-06-17 MED ORDER — FAMOTIDINE 20 MG PO TABS: 40.0000 mg | ORAL_TABLET | Freq: Once | ORAL | Status: DC | PRN

## 2022-06-17 MED ORDER — MAGNESIUM CITRATE PO SOLN: 1.0000 | Freq: Once | ORAL | Status: DC | PRN

## 2022-06-17 MED ORDER — ONDANSETRON HCL 4 MG/2ML IJ SOLN: 4.0000 mg | Freq: Four times a day (QID) | INTRAMUSCULAR | Status: DC | PRN

## 2022-06-17 MED ORDER — PHENOL 1.4 % MT LIQD: 1.0000 | OROMUCOSAL | Status: DC | PRN

## 2022-06-17 MED ORDER — METOPROLOL TARTRATE 5 MG/5ML IV SOLN
INTRAVENOUS | Status: DC | PRN
  Administered 2022-06-17 (×5): 1 mg via INTRAVENOUS

## 2022-06-17 MED ORDER — CEFAZOLIN SODIUM-DEXTROSE 2-4 GM/100ML-% IV SOLN
2.0000 g | Freq: Three times a day (TID) | INTRAVENOUS | Status: AC
  Administered 2022-06-17 – 2022-06-18 (×2): 2 g via INTRAVENOUS
  Filled 2022-06-17 (×3): qty 100

## 2022-06-17 MED ORDER — FENTANYL CITRATE (PF) 100 MCG/2ML IJ SOLN
INTRAMUSCULAR | Status: AC
  Administered 2022-06-17: 25 ug via INTRAVENOUS
  Filled 2022-06-17: qty 2

## 2022-06-17 MED ORDER — METOPROLOL TARTRATE 5 MG/5ML IV SOLN
INTRAVENOUS | Status: AC
  Filled 2022-06-17: qty 5

## 2022-06-17 MED ORDER — SODIUM CHLORIDE 0.9 % IV SOLN: 500.0000 mL | Freq: Once | INTRAVENOUS | Status: DC | PRN

## 2022-06-17 MED ORDER — CEFAZOLIN SODIUM-DEXTROSE 2-4 GM/100ML-% IV SOLN
INTRAVENOUS | Status: AC
  Filled 2022-06-17: qty 100

## 2022-06-17 MED ORDER — DOPAMINE-DEXTROSE 3.2-5 MG/ML-% IV SOLN: 3.0000 ug/kg/min | INTRAVENOUS | Status: DC

## 2022-06-17 MED ORDER — PHENYLEPHRINE 80 MCG/ML (10ML) SYRINGE FOR IV PUSH (FOR BLOOD PRESSURE SUPPORT)
PREFILLED_SYRINGE | INTRAVENOUS | Status: DC | PRN
  Administered 2022-06-17 (×3): 160 ug via INTRAVENOUS
  Administered 2022-06-17: 80 ug via INTRAVENOUS
  Administered 2022-06-17 (×2): 160 ug via INTRAVENOUS
  Administered 2022-06-17: 80 ug via INTRAVENOUS
  Administered 2022-06-17 (×2): 160 ug via INTRAVENOUS

## 2022-06-17 MED ORDER — OXYCODONE HCL 5 MG/5ML PO SOLN: 5.0000 mg | Freq: Once | ORAL | Status: DC | PRN

## 2022-06-17 MED ORDER — BISACODYL 5 MG PO TBEC: 5.0000 mg | DELAYED_RELEASE_TABLET | Freq: Every day | ORAL | Status: DC | PRN

## 2022-06-17 MED ORDER — ONDANSETRON HCL 4 MG/2ML IJ SOLN: 4.0000 mg | Freq: Once | INTRAMUSCULAR | Status: DC | PRN

## 2022-06-17 MED ORDER — SODIUM CHLORIDE 0.9 % IV SOLN: INTRAVENOUS | Status: DC | PRN

## 2022-06-17 MED ORDER — DIPHENHYDRAMINE HCL 50 MG/ML IJ SOLN: 50.0000 mg | Freq: Once | INTRAMUSCULAR | Status: DC | PRN

## 2022-06-17 MED ORDER — GUAIFENESIN-DM 100-10 MG/5ML PO SYRP: 15.0000 mL | ORAL_SOLUTION | ORAL | Status: DC | PRN

## 2022-06-17 MED ORDER — LABETALOL HCL 5 MG/ML IV SOLN: 10.0000 mg | INTRAVENOUS | Status: DC | PRN

## 2022-06-17 MED ORDER — PHENYLEPHRINE HCL-NACL 20-0.9 MG/250ML-% IV SOLN
INTRAVENOUS | Status: DC | PRN
  Administered 2022-06-17: 30 ug/min via INTRAVENOUS

## 2022-06-17 MED ORDER — ONDANSETRON HCL 4 MG/2ML IJ SOLN
INTRAMUSCULAR | Status: DC | PRN
  Administered 2022-06-17: 4 mg via INTRAVENOUS

## 2022-06-17 MED ORDER — FAMOTIDINE IN NACL 20-0.9 MG/50ML-% IV SOLN
20.0000 mg | Freq: Two times a day (BID) | INTRAVENOUS | Status: DC
  Administered 2022-06-18: 20 mg via INTRAVENOUS
  Filled 2022-06-17 (×2): qty 50

## 2022-06-17 MED ORDER — POTASSIUM CHLORIDE CRYS ER 20 MEQ PO TBCR: 20.0000 meq | EXTENDED_RELEASE_TABLET | Freq: Every day | ORAL | Status: DC | PRN

## 2022-06-17 MED ORDER — HEPARIN SODIUM (PORCINE) 1000 UNIT/ML IJ SOLN
INTRAMUSCULAR | Status: DC | PRN
  Administered 2022-06-17: 8000 [IU] via INTRAVENOUS
  Administered 2022-06-17 (×2): 2000 [IU] via INTRAVENOUS

## 2022-06-17 MED ORDER — ETOMIDATE 2 MG/ML IV SOLN
INTRAVENOUS | Status: DC | PRN
  Administered 2022-06-17 (×2): 10 mg via INTRAVENOUS

## 2022-06-17 MED ORDER — ALUM & MAG HYDROXIDE-SIMETH 200-200-20 MG/5ML PO SUSP
15.0000 mL | ORAL | Status: DC | PRN
  Administered 2022-06-21 – 2022-06-23 (×2): 30 mL via ORAL
  Filled 2022-06-17 (×2): qty 30

## 2022-06-17 MED ORDER — HYDROMORPHONE HCL 1 MG/ML IJ SOLN
1.0000 mg | Freq: Once | INTRAMUSCULAR | Status: AC | PRN
  Administered 2022-06-17: 1 mg via INTRAVENOUS
  Filled 2022-06-17: qty 1

## 2022-06-17 MED ORDER — MAGNESIUM SULFATE 2 GM/50ML IV SOLN: 2.0000 g | Freq: Every day | INTRAVENOUS | Status: DC | PRN

## 2022-06-17 MED ORDER — ASPIRIN 81 MG PO TBEC
81.0000 mg | DELAYED_RELEASE_TABLET | Freq: Every day | ORAL | Status: DC
  Administered 2022-06-18 – 2022-06-26 (×7): 81 mg via ORAL
  Filled 2022-06-17 (×10): qty 1

## 2022-06-17 MED ORDER — LACTATED RINGERS IV SOLN: INTRAVENOUS | Status: DC

## 2022-06-17 MED ORDER — ACETAMINOPHEN 325 MG PO TABS
325.0000 mg | ORAL_TABLET | ORAL | Status: DC | PRN
  Administered 2022-06-18: 325 mg via ORAL
  Administered 2022-06-18 – 2022-06-23 (×6): 650 mg via ORAL
  Filled 2022-06-17: qty 1
  Filled 2022-06-17 (×6): qty 2

## 2022-06-17 MED ORDER — METHYLPREDNISOLONE SODIUM SUCC 125 MG IJ SOLR: 125.0000 mg | Freq: Once | INTRAMUSCULAR | Status: DC | PRN

## 2022-06-17 MED ORDER — ROCURONIUM BROMIDE 100 MG/10ML IV SOLN
INTRAVENOUS | Status: DC | PRN
  Administered 2022-06-17: 10 mg via INTRAVENOUS
  Administered 2022-06-17: 90 mg via INTRAVENOUS
  Administered 2022-06-17: 20 mg via INTRAVENOUS
  Administered 2022-06-17: 10 mg via INTRAVENOUS

## 2022-06-17 MED ORDER — SUGAMMADEX SODIUM 500 MG/5ML IV SOLN
INTRAVENOUS | Status: AC
  Filled 2022-06-17: qty 5

## 2022-06-17 MED ORDER — OXYCODONE HCL 5 MG PO TABS: 5.0000 mg | ORAL_TABLET | Freq: Once | ORAL | Status: DC | PRN

## 2022-06-17 MED ORDER — PROPOFOL 10 MG/ML IV BOLUS
INTRAVENOUS | Status: DC | PRN
  Administered 2022-06-17: 50 mg via INTRAVENOUS

## 2022-06-17 MED ORDER — HEPARIN (PORCINE) 25000 UT/250ML-% IV SOLN
2200.0000 [IU]/h | INTRAVENOUS | Status: DC
  Administered 2022-06-17: 1850 [IU]/h via INTRAVENOUS
  Administered 2022-06-18: 2000 [IU]/h via INTRAVENOUS
  Administered 2022-06-18: 1850 [IU]/h via INTRAVENOUS
  Filled 2022-06-17 (×2): qty 250

## 2022-06-17 MED ORDER — SODIUM CHLORIDE 0.9 % IV SOLN
INTRAVENOUS | Status: DC
  Administered 2022-06-17: 1000 mL via INTRAVENOUS

## 2022-06-17 MED ORDER — EPHEDRINE SULFATE (PRESSORS) 50 MG/ML IJ SOLN
INTRAMUSCULAR | Status: DC | PRN
  Administered 2022-06-17: 2.5 mg via INTRAVENOUS
  Administered 2022-06-17 (×2): 5 mg via INTRAVENOUS

## 2022-06-17 MED ORDER — MORPHINE SULFATE (PF) 4 MG/ML IV SOLN
2.0000 mg | INTRAVENOUS | Status: DC | PRN
  Administered 2022-06-17 – 2022-06-18 (×2): 4 mg via INTRAVENOUS
  Filled 2022-06-17 (×2): qty 1

## 2022-06-17 MED ORDER — DOCUSATE SODIUM 100 MG PO CAPS
100.0000 mg | ORAL_CAPSULE | Freq: Every day | ORAL | Status: DC
  Administered 2022-06-18 – 2022-06-21 (×4): 100 mg via ORAL
  Filled 2022-06-17 (×4): qty 1

## 2022-06-17 MED ORDER — LIDOCAINE HCL (CARDIAC) PF 100 MG/5ML IV SOSY
PREFILLED_SYRINGE | INTRAVENOUS | Status: DC | PRN
  Administered 2022-06-17: 100 mg via INTRAVENOUS

## 2022-06-17 MED ORDER — ACETAMINOPHEN 10 MG/ML IV SOLN: 1000.0000 mg | Freq: Once | INTRAVENOUS | Status: DC | PRN

## 2022-06-17 MED ORDER — FENTANYL CITRATE (PF) 100 MCG/2ML IJ SOLN
25.0000 ug | INTRAMUSCULAR | Status: AC | PRN
  Administered 2022-06-17 (×4): 25 ug via INTRAVENOUS

## 2022-06-17 MED ORDER — CEFAZOLIN SODIUM-DEXTROSE 2-3 GM-%(50ML) IV SOLR
INTRAVENOUS | Status: DC | PRN
  Administered 2022-06-17: 2 g via INTRAVENOUS
  Administered 2022-06-17: 1 g via INTRAVENOUS

## 2022-06-17 MED ORDER — CEFAZOLIN SODIUM-DEXTROSE 2-4 GM/100ML-% IV SOLN: 2.0000 g | INTRAVENOUS | Status: DC

## 2022-06-17 MED ORDER — ACETAMINOPHEN 325 MG RE SUPP: 325.0000 mg | RECTAL | Status: DC | PRN

## 2022-06-17 MED ORDER — SUCCINYLCHOLINE CHLORIDE 200 MG/10ML IV SOSY
PREFILLED_SYRINGE | INTRAVENOUS | Status: DC | PRN
  Administered 2022-06-17: 100 mg via INTRAVENOUS

## 2022-06-17 MED ORDER — IODIXANOL 320 MG/ML IV SOLN
INTRAVENOUS | Status: DC | PRN
  Administered 2022-06-17: 130 mL

## 2022-06-17 SURGICAL SUPPLY — 63 items
ADH SKN CLS APL DERMABOND .7 (GAUZE/BANDAGES/DRESSINGS) ×1
BALLN ARMADA 14X40X80 (BALLOONS) ×1
BALLN ULTRVRSE 10X60X75 (BALLOONS) ×1
BALLOON ARMADA 14X40X80 (BALLOONS) IMPLANT
BALLOON ULTRVRSE 10X60X75 (BALLOONS) IMPLANT
CANISTER PENUMBRA ENGINE (MISCELLANEOUS) IMPLANT
CATH ACCU-VU SIZ PIG 5F 70CM (CATHETERS) IMPLANT
CATH BALLN CODA 9X100X32 (BALLOONS) IMPLANT
CATH BEACON 5 .035 65 C2 TIP (CATHETERS) IMPLANT
CATH BEACON 5 .035 65 KMP TIP (CATHETERS) IMPLANT
CATH LIGHTNING 8 XTORQ 115 (CATHETERS) IMPLANT
CATH MICROCATH PRGRT 2.8F 130 (MICROCATHETER) IMPLANT
CATH TEMPO 5F RIM 65CM (CATHETERS) IMPLANT
CLOSURE PERCLOSE PROSTYLE (VASCULAR PRODUCTS) IMPLANT
COIL 400 COMPLEX SOFT 12X40CM (Vascular Products) IMPLANT
COIL 400 COMPLEX SOFT 6X30CM (Vascular Products) IMPLANT
COVER DRAPE FLUORO 36X44 (DRAPES) IMPLANT
DERMABOND ADVANCED .7 DNX12 (GAUZE/BANDAGES/DRESSINGS) IMPLANT
DEVICE OCCLUSION POD12 (Embolic) IMPLANT
DEVICE OCCLUSION POD6 (Vascular Products) IMPLANT
DEVICE OCCLUSION PODJ15 (Vascular Products) IMPLANT
DEVICE OCCLUSION PODJ60 (Vascular Products) IMPLANT
DEVICE SAFEGUARD 24CM (GAUZE/BANDAGES/DRESSINGS) IMPLANT
DEVICE TORQUE .025-.038 (MISCELLANEOUS) IMPLANT
DRYSEAL FLEXSHEATH 12FR 45CM (SHEATH) ×1
DRYSEAL FLEXSHEATH 16FR 33CM (SHEATH) ×1
DRYSEAL FLEXSHEATH 18FR 33CM (SHEATH) ×1
ENDOPRO ILIAC 23X14X10 FA (Endovascular Graft) ×1 IMPLANT
ENDOPRO ILIAC HC 16X10X7 FA (Endovascular Graft) ×1 IMPLANT
ENDOPROSTHESIS ILI  16X10X7 FA (Endovascular Graft) IMPLANT
ENDOPROSTHESIS ILI 23X14X10 FA (Endovascular Graft) IMPLANT
ENSNARE 9-15 (MISCELLANEOUS) IMPLANT
EXCLUDER TNK LEG 31MX14X17 (Endovascular Graft) IMPLANT
EXCLUDER TRUNK LEG 31MX14X17 (Endovascular Graft) ×1 IMPLANT
GLIDEWIRE ANGLED SS 035X260CM (WIRE) IMPLANT
GLIDEWIRE STIFF .35X180X3 HYDR (WIRE) IMPLANT
GUIDEWIRE ANGLED .035 180CM (WIRE) IMPLANT
GUIDEWIRE PFTE-COATED .018X300 (WIRE) IMPLANT
GUIDEWIRE VASC STIFF .038X260 (WIRE) IMPLANT
HANDLE DETACHMENT COIL (MISCELLANEOUS) IMPLANT
KIT ENCORE 26 ADVANTAGE (KITS) IMPLANT
LEG CONTRALATERAL 16X16X9.5 (Endovascular Graft) IMPLANT
LEG CONTRALATERAL 23X10 (Vascular Products) IMPLANT
MICROCATH PROGREAT 2.8F 130CM (MICROCATHETER) ×1
OCCLUSION DEVICE POD12 (Embolic) ×2 IMPLANT
OCCLUSION DEVICE POD6 (Vascular Products) ×1 IMPLANT
OCCLUSION DEVICE PODJ15 (Vascular Products) ×1 IMPLANT
OCCLUSION DEVICE PODJ60 (Vascular Products) ×2 IMPLANT
PACK ANGIOGRAPHY (CUSTOM PROCEDURE TRAY) ×1 IMPLANT
SHEATH BRITE TIP 6FRX11 (SHEATH) IMPLANT
SHEATH BRITE TIP 8FRX11 (SHEATH) IMPLANT
SHEATH DRYSEAL FLEX 12FR 45CM (SHEATH) IMPLANT
SHEATH DRYSEAL FLEX 16FR 33CM (SHEATH) IMPLANT
SHEATH DRYSEAL FLEX 18FR 33CM (SHEATH) IMPLANT
SPONGE XRAY 4X4 16PLY STRL (MISCELLANEOUS) IMPLANT
STENT LIFESTREAM 6X58X80 (Permanent Stent) IMPLANT
STENT LIFESTREAM 8X58X80 (Permanent Stent) IMPLANT
SYR MEDRAD MARK 7 150ML (SYRINGE) IMPLANT
TUBING CONTRAST HIGH PRESS 72 (TUBING) IMPLANT
WIRE AMPLATZ SSTIFF .035X260CM (WIRE) IMPLANT
WIRE G V18X300CM (WIRE) IMPLANT
WIRE GUIDERIGHT .035X150 (WIRE) IMPLANT
WIRE SUPRACORE 300CM (MISCELLANEOUS) IMPLANT

## 2022-06-17 NOTE — Consult Note (Signed)
ANTICOAGULATION CONSULT NOTE  Pharmacy Consult for Heparin Indication: atrial fibrillation  No Known Allergies  Patient Measurements: Height: 6' (182.9 cm) Weight: 124.3 kg (274 lb 0.5 oz) IBW/kg (Calculated) : 77.6 Heparin Dosing Weight: 105.2 kg  Vital Signs: Temp: 97.3 F (36.3 C) (01/11 0313) Temp Source: Oral (01/11 0313) BP: 125/94 (01/11 0313) Pulse Rate: 91 (01/11 0313)  Labs: Recent Labs    06/14/22 1224 06/14/22 1413 06/15/22 0304 06/15/22 1710 06/16/22 0447 06/16/22 1816 06/17/22 0217  HGB 13.7  --  13.6  --  13.6  --  13.1  HCT 42.3  --  41.8  --  41.4  --  39.3  PLT 159  --  169  --  148*  --  142*  APTT  --   --   --  31  --   --   --   LABPROT 13.4  --   --   --   --   --   --   INR 1.0  --   --   --   --   --   --   HEPARINUNFRC  --   --   --   --  0.18* 0.37 0.60  CREATININE 0.76  --  1.00  --  1.17  --  0.96  TROPONINIHS 25* 28*  --   --   --   --   --      Estimated Creatinine Clearance: 96.1 mL/min (by C-G formula based on SCr of 0.96 mg/dL).   Medical History: Past Medical History:  Diagnosis Date   Arthritis    Ascending aortic aneurysm ([place])    a. 09/2017 Stable TAA - 5.1cm.   Asthma    Chronic systolic CHF (congestive heart failure) ([place])    a. EF 25-30% by echo in 07/2016 with cath showing no significant CAD b. 01/2017: EF 30-35% with diffuse HK and moderate MR; c. 07/2017 Echo: EF 30-35%, diff hK. Mild MR, mildly dil LA. PASP 13mmHg.   Diverticulitis    Diverticulitis of large intestine with perforation without abscess or bleeding 05/13/2017   Hypertension    Hyperthyroidism    NICM (nonischemic cardiomyopathy) ([place])    Noncompliance    Persistent atrial fibrillation (HCC)    a. CHA2DS2VASc = 3-->Eliquis (? compliance).    Medications:  No history of chronic anticoagulant use PTA  Assessment: 72yo male with PMH of HFrEF secondary to ischemic cardiomyopathy, persistent atrial fibrillation complicated by medication  noncompliance, throacic and abdominal aortic anuerysms, GI bleed in 2021 prompting discontinuation of apixaban who presented to the ED with chest pain and shortness of breath x 3 days. Pharmacy has been consulted to initiate and titrate heparin infusion while admitted.  Baseline labs: aPTT 31s, INR 1.0, Plts 169, Hgb 13.6  1/10 1816 HL 0.37, therapeutic 1/11 0217 HL 0.60, therapeutic x 2  Goal of Therapy:  Heparin level 0.3-0.7 units/ml Monitor platelets by anticoagulation protocol: Yes   Plan:  Heparin therapeutic, trending up -- continue heparin infusion at 1850 units/hr --Recheck anti-Xa level in 12 hours to reconfirm HL remains therapeutic, then daily while therapeutic  --Continue to monitor H&H and platelets  Renda Rolls, PharmD, [facility] 06/17/2022 3:55 AM

## 2022-06-17 NOTE — Interval H&P Note (Signed)
History and Physical Interval Note:  06/17/2022 10:07 AM  Eugene Henderson  has presented today for surgery, with the diagnosis of Aorta and ileac aneurysm.  The various methods of treatment have been discussed with the patient and family. After consideration of risks, benefits and other options for treatment, the patient has consented to  Procedure(s): ENDOVASCULAR REPAIR/STENT GRAFT (N/A) as a surgical intervention.  The patient's history has been reviewed, patient examined, no change in status, stable for surgery.  I have reviewed the patient's chart and labs.  Questions were answered to the patient's satisfaction.     [name]

## 2022-06-17 NOTE — Progress Notes (Signed)
PROGRESS NOTE  Donn Zanetti    DOB: Jul 14, 1950, 72 y.o.  FUX:[id number]    Code Status: Full Code   DOA: 06/14/2022   LOS: 3   Brief hospital course  Jerimiah Wolman is a 72 y.o. male with a PMH significant for hypertension, hyperlipidemia, non-insulin-dependent diabetes mellitus.  They presented from home to the ED on 06/14/2022 with chest pain and shortness of breath x 3 days. Current smoker.  In the ED, it was found that they had temperature of 97.9, respiration rate of 20, heart rate of 126, blood pressure 145/96, SpO2 of 97% on room air.  Significant findings included Serum sodium is 133, potassium 3.7, chloride 101, bicarb 21, BUN of 14, serum creatinine of 0.76, EGFR greater than 60, nonfasting blood glucose 113, WBC 5.4, hemoglobin 13.7, platelets of 159. BNP was elevated at 575.5. High sensitive troponin was 25 and on repeat was 28. COVID/influenza A/influenza B/RSV PCR were negative.  They were initially treated with Maalox PO, aspirin 324 mg p.o. one-time dose, furosemide 40 mg IV, Dilaudid 0.5 mg IV, metoprolol 5 mg IV x 2, sublingual nitroglycerin 0.4 mg one-time dose ordered.   1/9- choledocholithiasis was ruled out. Pursuing alternate causes of pain which includes PUD, CHF exacerbation, aortic aneurysms. Cardiology and vascular surgery consulted. GI signed off and recommended PPI and OP follow up.   1/10- cardiac optimization with diuresis.  06/17/22 - endovascular procedure  Assessment & Plan  Principal Problem:   Abdominal pain Active Problems:   Persistent atrial fibrillation (HCC)   Essential hypertension   Tobacco use   Chest pain   Diabetes mellitus without complication (HCC)   Morbid obesity (HCC)   AF (paroxysmal atrial fibrillation) (HCC)   Hyperlipidemia   Metal foreign body in head   Elevated bilirubin   Atrial fibrillation with RVR (HCC)   Thoracoabdominal aortic aneurysm (TAAA) (HCC)   HFrEF (heart failure with reduced ejection fraction) (HCC)   NICM  (nonischemic cardiomyopathy) (HCC)   Pre-operative cardiovascular examination  Abdominal pain- negative workup for choledocholithiasis. DDX remains open to PUD, acalculous cholecystitis, hypervolemia, aortic aneurysm. Pain is improved today.  - GI has signed off and recommends OP follow up - analgesia PRN - GI cocktail - continue PPI - strict I/O - discontinue anaerobic coverage - trend LFTs   Acute hypoxic respiratory failure- requiring 2L O2 [place] on admisson. 2/2 hypervolemia. Has now weaned to room air 2/2 diuresis.  - monitor.   HFrEF- echo 2022 revealed EF 30%. Repeat echo shows worsening function. EF 20-25%.  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 Abdomen CTA revealed several abnormalities 1/8 - vascular surgery consulted. Planning graft/stent 1/11. Cleared by cardiology for procedure.  - will need follow up with CT surgery after discharge for thoracic aortic aneurysm.   Delirium- event overnight 1/9. No repeat overnight or today.  - CIWA monitoring - xanax PRN - seroquel nightly   Essential hypertension- relatively well controlled - continue home meds including: Irbesartan 150 mg daily, metoprolol succinate 100 mg daily, spironolactone 25 mg daily resumed   Persistent atrial fibrillation (HCC)- rate controlled. Does not appear to be on anticoagulation at home. Currently on heparin gtt for vascular procedure - cardiology following, appreciate recs - Resumed home metoprolol succinate 100 mg daily  Metal foreign body in head- non acute issue   Hyperlipidemia -  Atorvastatin resumed   Morbid obesity ([place]) - This meets criteria for morbid obesity based on the presence of 1 or more chronic comorbidities. Patient has hypertension. This complicates overall care and prognosis.   Body mass index is  37.17 kg/m.  VTE ppx: Place TED hose Start: 06/14/22 1835, heparin gtt  Diet:     Diet   Diet NPO time specified Except for: Sips with Meds   Consultants: GI Cardiology Vascular surgery  Subjective 06/17/22    Pt reports feeling improved s/p pain medication. Denies SOB, CP. More alert today and able to converse, follow commands.    Objective   Vitals:   06/16/22 0957 06/16/22 1512 06/16/22 2019 06/17/22 0313  BP: 113/88 117/85 112/87 (!) 125/94  Pulse: 96 94 (!) 109 91  Resp:  20 16 16   Temp:  98.1 F (36.7 C) 97.9 F (36.6 C) (!) 97.3 F (36.3 C)  TempSrc:   Oral Oral  SpO2: 97% 95% 100% 100%  Weight:      Height:        Intake/Output Summary (Last 24 hours) at 06/17/2022 0722 Last data filed at 06/17/2022 0509 Gross per 24 hour  Intake 543.85 ml  Output 2225 ml  Net -1681.15 ml    Filed Weights   06/14/22 1222 06/15/22 0238  Weight: 122.5 kg 124.3 kg     Physical Exam:  General: awake, alert, NAD HEENT: atraumatic, clear conjunctiva, anicteric sclera, MMM, hearing grossly normal Respiratory: normal respiratory effort. Cardiovascular: quick capillary refill, normal S1/S2, RRR, no JVD, murmurs Gastrointestinal: soft, no tenderness to RUQ and epigastric and periumbilical area Nervous: A&O x2. Falls asleep when not actively being engaged. speech difficult to understand. Answers questions appropriately.  Extremities: moves all equally, 2+ pitting edema ankles Skin: dry, intact, normal temperature, normal color. No rashes, lesions or ulcers on exposed skin Psychiatry: normal mood, congruent affect  Labs   I have personally reviewed the following labs and imaging studies CBC    Component Value Date/Time   WBC 5.5 06/17/2022 0217   RBC 4.52 06/17/2022 0217   HGB 13.1 06/17/2022 0217   HGB 14.3 06/18/2021 1409   HCT 39.3 06/17/2022 0217   HCT 42.1 06/18/2021 1409   PLT 142 (L) 06/17/2022 0217   PLT 154 06/18/2021 1409   MCV 86.9 06/17/2022 0217   MCV  86 06/18/2021 1409   MCH 29.0 06/17/2022 0217   MCHC 33.3 06/17/2022 0217   RDW 14.3 06/17/2022 0217   RDW 15.1 06/18/2021 1409   LYMPHSABS 1.3 06/18/2021 1409   MONOABS 0.7 05/14/2017 0351   EOSABS 0.1 06/18/2021 1409   BASOSABS 0.0 06/18/2021 1409      Latest Ref Rng & Units 06/17/2022    2:17 AM 06/16/2022    4:47 AM 06/15/2022    3:04 AM  BMP  Glucose 70 - 99 mg/dL 102  88  134   BUN 8 - 23 mg/dL 33  34  21   Creatinine 0.61 - 1.24 mg/dL 0.96  1.17  1.00   Sodium 135 - 145 mmol/L 130  135  134   Potassium 3.5 - 5.1 mmol/L 3.7  4.0  4.1   Chloride 98 - 111 mmol/L 97  98  99   CO2 22 - 32 mmol/L 23  26  23    Calcium 8.9 - 10.3 mg/dL 8.2  8.7  9.0     ECHOCARDIOGRAM COMPLETE  Result Date: 06/16/2022    ECHOCARDIOGRAM REPORT   Patient Name:   GURNOOR  Routson Date of Exam: 06/15/2022 Medical Rec #:  [id number]      Height:       72.0 in Accession #:    [id number]     Weight:       274.0 lb Date of Birth:  [birth year]      BSA:          2.436 m Patient Age:    76 years       BP:           137/108 mmHg Patient Gender: M              HR:           108 bpm. Exam Location:  [facility] Procedure: 2D Echo, Cardiac Doppler and Color Doppler Indications:     Z66.06 Acute Systolic CHF  History:         Patient has prior history of Echocardiogram examinations, most                  recent 03/13/2021. Arrythmias:Atrial Fibrillation; Risk                  Factors:Hypertension. Ascending Aortic Aneurysm. Chronic                  systolic heart failure. Nonischemic cardiomyopathy.  Sonographer:     Cresenciano Lick RDCS Referring Phys:  [id number] [place] Diagnosing Phys: Nelva Bush MD IMPRESSIONS  1. Left ventricular ejection fraction, by estimation, is 20 to 25%. The left ventricle has severely decreased function. The left ventricle demonstrates global hypokinesis. The left ventricular internal cavity size was severely dilated. There is mild left ventricular hypertrophy. Left ventricular diastolic function  could not be evaluated.  2. Right ventricular systolic function is severely reduced. The right ventricular size is mildly enlarged. There is moderately elevated pulmonary artery systolic pressure.  3. Left atrial size was severely dilated.  4. Right atrial size was severely dilated.  5. The mitral valve is normal in structure. Moderate mitral valve regurgitation.  6. Tricuspid valve regurgitation is mild to moderate.  7. The aortic valve is tricuspid. Aortic valve regurgitation is trivial. No aortic stenosis is present.  8. Aortic dilatation noted. 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  9. The inferior vena cava is dilated in size with <50% respiratory variability, suggesting right atrial pressure of 15 mmHg. FINDINGS  Left Ventricle: Left ventricular ejection fraction, by estimation, is 20 to 25%. The left ventricle has severely decreased function. The left ventricle demonstrates global hypokinesis. The left ventricular internal cavity size was severely dilated. There is mild left ventricular hypertrophy. Left ventricular diastolic function could not be evaluated due to atrial fibrillation. Left ventricular diastolic function could not be evaluated. Right Ventricle: The right ventricular size is mildly enlarged. No increase in right ventricular wall thickness. Right ventricular systolic function is severely reduced. There is moderately elevated pulmonary artery systolic pressure. The tricuspid regurgitant velocity is 2.95 m/s, and with an assumed right atrial pressure of 15 mmHg, the estimated right ventricular systolic pressure is 09.3 mmHg. Left Atrium: Left atrial size was severely dilated. Right Atrium: Right atrial size was severely dilated. Pericardium: There is no evidence of pericardial effusion. Mitral Valve: The mitral valve is normal in structure. Moderate mitral valve regurgitation. Tricuspid Valve: The  tricuspid valve is normal in structure. Tricuspid valve regurgitation is mild to moderate. Aortic Valve: The aortic  valve is tricuspid. Aortic valve regurgitation is trivial. No aortic stenosis is present. Pulmonic Valve: The pulmonic valve was normal in structure. Pulmonic valve regurgitation is trivial. No evidence of pulmonic stenosis. Aorta: Aortic dilatation noted. 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 Pulmonary Artery: The pulmonary artery is not well seen. Venous: The inferior vena cava is dilated in size with less than 50% respiratory variability, suggesting right atrial pressure of 15 mmHg. IAS/Shunts: No atrial level shunt detected by color flow Doppler.  LEFT VENTRICLE PLAX 2D LVIDd:         6.50 cm LVIDs:         5.10 cm LV PW:         1.00 cm LV IVS:        1.00 cm LVOT diam:     2.80 cm LV SV:         44 LV SV Index:   18 LVOT Area:     6.16 cm  RIGHT VENTRICLE             IVC RV Basal diam:  4.60 cm     IVC diam: 3.20 cm RV S prime:     11.27 cm/s LEFT ATRIUM            Index        RIGHT ATRIUM           Index LA diam:      6.20 cm  2.55 cm/m   RA Area:     41.10 cm LA Vol (A2C): 207.0 ml 84.99 ml/m  RA Volume:   171.00 ml 70.21 ml/m LA Vol (A4C): 102.0 ml 41.88 ml/m  AORTIC VALVE LVOT Vmax:   48.90 cm/s LVOT Vmean:  31.460 cm/s LVOT VTI:    0.072 m  AORTA Ao Root diam: 5.27 cm Ao Asc diam:  5.00 cm MITRAL VALVE               TRICUSPID VALVE MV Area (PHT): 5.66 cm    TR Peak grad:   34.8 mmHg MV Decel Time: 134 msec    TR Vmax:        295.00 cm/s MV E velocity: 67.10 cm/s MV A velocity: 41.90 cm/s  SHUNTS MV E/A ratio:  1.60        Systemic VTI:  0.07 m                            Systemic Diam: 2.80 cm Nelva Bush MD Electronically signed by Nelva Bush MD Signature Date/Time: 06/16/2022/7:15:53 AM    Final     Disposition Plan & Communication  Patient status: Inpatient  Admitted From:  Home Planned disposition location: Home Anticipated discharge date: 1/13 pending vascular procedure and treatment of HF  Family Communication: none    Author: Richarda Osmond, DO [facility] 06/17/2022, 7:22 AM   Available by Epic secure chat 7AM-7PM. If 7PM-7AM, please contact night-coverage.  [facility] contact information found on [email or web address].

## 2022-06-17 NOTE — TOC Benefit Eligibility Note (Addendum)
Patient Teacher, English as a foreign language completed.    The patient is currently admitted and upon discharge could be taking Eliquis 5 mg.  The current 30 day co-pay is $0.00.   The patient is currently admitted and upon discharge could be taking Entresto 24-26 mg.  The current 30 day co-pay is $0.00.   The patient is insured through [place] Patient Advocate Specialist [place] Patient Advocate Team Direct Number: [phone]  Fax: [phone]

## 2022-06-17 NOTE — Care Management Important Message (Signed)
Important Message  Patient Details  Name: Eugene Henderson MRN: [id number] Date of Birth: 06-Jul-1950   Medicare Important Message Given:  Yes  Out of room for procedure upon time of visit.  Copy of Medicare IM left on bedside tray in room for reference.   [name] 06/17/2022, 12:56 PM

## 2022-06-17 NOTE — Op Note (Signed)
OPERATIVE NOTE     PROCEDURE: US guidance for vascular access, bilateral femoral arteries Catheter placement into aorta from bilateral femoral approaches, into bilateral internal iliac arteries from left femoral approach, and into the left femoral artery from the right femoral approach Catheter placement into tertiary branches of the right internal iliac artery from the left femoral approach Selective pelvic angiography of bilateral internal iliac arteries Coil embolization of the secondary right internal iliac artery branches using three 6 mm rim coils and a pod 6 packing coil Coil embolization of the left internal iliac artery with a total of three 12 mm diameter coils and 2 packing coils for aneurysmal disease Placement of a C3 Gore Excluder Endoprosthesis 31 mm diameter by 18 cm length main body left with a 23 mm diameter by 10 cm length right contralateral limb Placement of a C3 Gore Excluder bifurcated iliac segment right iliac with a 23 mm x 10 cm length bifurcated device which is deployed into the right external iliac artery Placement of 2 additional stents into the right hypogastric artery using a 6 mm diameter by 58 mm length Lifestream stent and an 8 mm diameter by 58 mm length Lifestream stent Placement of a 14 mm diameter by 12 cm length extender limb left external iliac artery Mechanical thrombectomy of left femoral artery from right femoral approach using the penumbra CAT 8 device Pro glide closures utilized in a pre-close fashion bilateral common femoral arteries   PRE-OPERATIVE DIAGNOSIS: AAA, iliac artery aneurysms   POST-OPERATIVE DIAGNOSIS: same   SURGEON: Hortencia Pilar, MD and [name], MD - Co-surgeons   ANESTHESIA: general   ESTIMATED BLOOD LOSS: 100 cc   FINDING(S): 1.  Small abdominal aortic aneurysm with large internal iliac artery aneurysms bilaterally left larger than right   SPECIMEN(S):  none   INDICATIONS:   Eugene Henderson is a 72 y.o. y.o. male who  presents with an abdominal aortic aneurysm that is now greater than 5 cm and is associated with bilateral common iliac artery aneurysms. The patient will require endovascular repair to prevent lethal rupture. Risks and benefits are discussed and the patient is agreeable to proceed. Co-surgeons are used to expedite the procedure and reduce operative time as bilateral work needs to be done.   DESCRIPTION: After obtaining full informed written consent, the patient was brought back to the operating room and placed supine upon the operating table.  The patient received IV antibiotics prior to induction.  After obtaining adequate anesthesia, the patient was prepped and draped in the standard fashion for endovascular AAA repair.  We then began by gaining access to both femoral arteries with US guidance with me working on the left and Dr. Delana Meyer working on the right.  The femoral arteries were found to be patent and accessed without difficulty with a needle under ultrasound guidance without difficulty on each side and permanent images were recorded.  We then placed 2 proglide devices on each side in a pre-close fashion and placed 8 French sheaths.   The patient was then given 8000 units of intravenous heparin.  Additional heparin was given as needed through the case.   The Pigtail catheter was placed into the aorta from the right side. Image was then obtained demonstrating the right iliac bifurcation. Stiff Amplatz wire was then advanced up both the right and left sides. 58 French sheath was then advanced up the right side and the 8 [place] sheath was exchanged for a 12 French sheath up the left side. Subsequently a  Glidewire was advanced up the left side and snared by Dr. Delana Meyer from the right side and pulled extracorporeally. Using this wire which now is traveling from the right groin over the aortic bifurcation to the left groin the 12 French sheath is advanced and positioned just proximal to the right iliac  bifurcation. The right sided 23 mm proximal iliac bifurcation device was then prepped on the back table and advanced through the 10 French sheath and positioned above the right iliac bifurcation. The proximal portion was then opened and the  left groin sheath was advanced into the main portion of the iliac bifurcation device. I then used a KMP catheter and a Glidewire the wire catheter combination was negotiate the right internal iliac artery. Catheter was then introduced down into the tertiary branches hand injection contrast was used to localize the first major bifurcation as well as the distal anatomy.  This represents 3rd order catheter placement.  There was a large sidebranch and with an internal iliac artery aneurysm, we elected to coil embolize this.  I cannulated this with a prograde microcatheter and perform selective imaging I then deployed three 6 mm diameter Ruby coils and a pod 6 coil in this right hypogastric artery branch and successful coil embolization was performed.  I then turned my attention to the large distal branch that would be our main outflow for the internal iliac artery.  Subsequently, an Amplatz Super Stiff wire was advanced through the catheter and the catheter removed. Next, the 6 mm diameter by 58 mm length Lifestream stent was deployed in the distal internal iliac artery.  A 10 mm diameter by 7 cm length right iliac limb was then deployed into the proximal right internal iliac artery.  To bridge these 2 stents an 8 mm diameter by 58 mm length Lifestream stent was deployed.  This was postdilated with a 10 mm balloon proximally and a 14 mm balloon at the confluence to the iliac branch device..  Angioplasty balloon was then advanced up both the right and left side and simultaneously inflated using a 10 mm balloon in the right external iliac and a 14 mm balloon in the right internal iliac. Once this had been achieved the crossing Glidewire  was removed the left sided sheath was pulled  back into the left common iliac and an stiff angle Glidewire was advanced under fluoroscopic guidance into the descending thoracic aorta. The 12 French sheath on the left was then exchanged for a 18 [place] sheath. 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  A rim catheter was used to cannulate the proximal left hypogastric artery where selective imaging showed the large aneurysm with only a single outflow branch.  Using appropriate microcatheter we were able to get down to the outflow branch and begin coil embolization.  A total of three 12 mm diameter by either 40 or 60 cm length coils were deployed and then a pair of 60 cm packing coils were deployed in the proximal left internal iliac artery.  Completion imaging showed successful embolization with no flow through the left internal iliac artery.  However, on completion imaging there appeared to be thrombus in the left common femoral artery.  Using a rim catheter Dr. Delana Meyer cross the aortic bifurcation from the right femoral approach and got down into the left common femoral artery where selective imaging showed the thrombus.  He then exchanged for the penumbra CAT 8 catheter and perform mechanical  thrombectomy with 2 passes with the penumbra CAT 8 catheter in the left common femoral artery.  Completion imaging after mechanical thrombectomy showed no significant residual thrombus in the left common femoral artery with fairly brisk flow in the left SFA and profunda femoris arteries.   Pigtail catheter was then reintroduced and angiography of the abdominal aorta was obtained localized in the renal arteries. Using this image, we selected a 31 mm diameter by 18 cm length Main body device. The main body was then advanced over a stiff wire. A Pigtail catheter was placed up the right side and a magnified image at the renal arteries was performed. The main body was then deployed just below the lowest  renal artery. The Kumpe catheter was used to cannulate the contralateral gate without difficulty and successful cannulation was confirmed by twirling the pigtail catheter in the main body. We then placed a stiff wire and a retrograde arteriogram was performed through the right femoral sheath.   A 23 mm diameter by 10 cm length right iliac limb was selected and advance through the contralateral gate of the main body.  The limb was deployed bridging the main body to the bifurcated iliac device. The main body deployment was then completed. Based off the angiographic findings, extension limbs were necessary.  A 14 mm diameter by 12 cm length left iliac extender limb was then advanced up the left side and deployed.  This was placed down to the mid left external iliac artery to exclude the common and internal iliac artery aneurysms.  All remaining junction points and seals zones were treated with the compliant balloon.    The pigtail catheter was then replaced and a completion angiogram was performed.   No Endoleak was detected on completion angiography. The renal arteries were found to be widely patent.  The right hypogastric artery stents had good flow.  The left hypogastric artery had been successfully embolized and excluded.   At this point we elected to terminate the procedure. We secured the pro glide devices for hemostasis on the femoral arteries. The skin incision was closed with a 4-0 Monocryl. Dermabond and pressure dressing were placed. The patient was taken to the recovery room in stable condition having tolerated the procedure well.   COMPLICATIONS: none   CONDITION: stable   Festus Barren [facility] Office: [phone]   06/17/2022, 4:32 PM

## 2022-06-17 NOTE — Consult Note (Signed)
ANTICOAGULATION CONSULT NOTE  Pharmacy Consult for Heparin Infusion Indication: atrial fibrillation  No Known Allergies  Patient Measurements: Height: 6' (182.9 cm) Weight: 124.3 kg (274 lb 0.5 oz) IBW/kg (Calculated) : 77.6 Heparin Dosing Weight: 105.2 kg  Vital Signs: Temp: 98 F (36.7 C) (01/11 1815) Temp Source: Oral (01/11 1815) BP: 116/92 (01/11 1815) Pulse Rate: 85 (01/11 1815)  Labs: Recent Labs    06/15/22 1710 06/16/22 0447 06/16/22 1816 06/17/22 0217 06/17/22 1730  HGB  --  13.6  --  13.1 12.8*  HCT  --  41.4  --  39.3 39.5  PLT  --  148*  --  142* 135*  APTT 31  --   --   --   --   LABPROT  --   --   --   --  16.2*  INR  --   --   --   --  1.3*  HEPARINUNFRC  --  0.18* 0.37 0.60  --   CREATININE  --  1.17  --  0.96 0.87     Estimated Creatinine Clearance: 106.1 mL/min (by C-G formula based on SCr of 0.87 mg/dL).   Medical History: Past Medical History:  Diagnosis Date   Arthritis    Ascending aortic aneurysm ([place])    a. 09/2017 Stable TAA - 5.1cm.   Asthma    Chronic systolic CHF (congestive heart failure) ([place])    a. EF 25-30% by echo in 07/2016 with cath showing no significant CAD b. 01/2017: EF 30-35% with diffuse HK and moderate MR; c. 07/2017 Echo: EF 30-35%, diff hK. Mild MR, mildly dil LA. PASP 56mmHg.   Diverticulitis    Diverticulitis of large intestine with perforation without abscess or bleeding 05/13/2017   Hypertension    Hyperthyroidism    NICM (nonischemic cardiomyopathy) ([place])    Noncompliance    Persistent atrial fibrillation ([facility])    a. CHA2DS2VASc = 3-->Eliquis (? compliance).    Medications:  Scheduled:   [START ON 06/18/2022] aspirin EC  81 mg Oral Q0600   [START ON 06/18/2022] Chlorhexidine Gluconate Cloth  6 each Topical Q0600   [START ON 06/18/2022] docusate sodium  100 mg Oral Daily   furosemide  40 mg Intravenous BID   HYDROmorphone       irbesartan  150 mg Oral Daily   metoprolol tartrate  25 mg Oral TID    mometasone-formoterol  2 puff Inhalation BID   pantoprazole (PROTONIX) IV  40 mg Intravenous Q12H   potassium chloride  40 mEq Oral BID   spironolactone  25 mg Oral Daily   thiamine  100 mg Oral Daily   varenicline  1 mg Oral Daily   Infusions:   sodium chloride Stopped (06/15/22 0717)   sodium chloride 75 mL/hr at 06/17/22 1718   sodium chloride      ceFAZolin (ANCEF) IV     DOPamine     famotidine (PEPCID) IV     heparin     magnesium sulfate bolus IVPB     nitroGLYCERIN     PRN: sodium chloride, sodium chloride, acetaminophen **OR** acetaminophen, albuterol, alum & mag hydroxide-simeth, bisacodyl, fentaNYL (SUBLIMAZE) injection, guaiFENesin-dextromethorphan, hydrALAZINE, HYDROmorphone, HYDROmorphone (DILAUDID) injection, labetalol, LORazepam, magnesium citrate, magnesium sulfate bolus IVPB, metoprolol tartrate, morphine injection, nitroGLYCERIN, ondansetron **OR** ondansetron (ZOFRAN) IV, oxyCODONE-acetaminophen, phenol, polyethylene glycol, potassium chloride, QUEtiapine  Assessment: Eugene Henderson is a 72 y.o. male with PMH significant for HFrEF secondary to ischemic cardiomyopathy, AF, throacic and abdominal aortic anuerysms, GIB in 2021 prompting discontinuation  of apixaban. Patient not on anticoagulation PTA. He presented to the ED with chest pain and SOB x 3 days. Heparin infusion was paused for endovascular AAA repair on 1/11. Pharmacy has been consulted to initiate and titrate heparin infusion while admitted.  Baseline labs: aPTT 31, INR 1.0, Plt 169, Hgb 13.6  Goal of Therapy:  Heparin level 0.3-0.7 units/ml Monitor platelets by anticoagulation protocol: Yes   Date Time HL Rate/Comment  1/10 1816  0.37 1850/therapeutic x1 1/11 0217 0.60 1850/therapeutic x2 1/11 1300 --- 0/hold for procedure 1/11 2230 --- 1850/resume per vascular   Plan: Per vascular, resume heparin infusion at 1850 units/hr without bolus Check HL 8 hours after resuming heparin Continue to monitor H&H  and platelets daily while on heparin infusion   [name], PharmD PGY1 Pharmacy Resident 06/17/2022 7:00 PM

## 2022-06-17 NOTE — Plan of Care (Signed)

## 2022-06-17 NOTE — Anesthesia Procedure Notes (Signed)
Procedure Name: Intubation Date/Time: 06/17/2022 1:00 PM  Performed by: Kelton Pillar, CRNAPre-anesthesia Checklist: Patient identified, Emergency Drugs available, Suction available and Patient being monitored Patient Re-evaluated:Patient Re-evaluated prior to induction Oxygen Delivery Method: Circle system utilized Preoxygenation: Pre-oxygenation with 100% oxygen Induction Type: IV induction Ventilation: Mask ventilation without difficulty Laryngoscope Size: McGraph and 3 Grade View: Grade I Tube type: Oral Tube size: 7.0 mm Number of attempts: 1 Airway Equipment and Method: Stylet and Oral airway Placement Confirmation: ETT inserted through vocal cords under direct vision, positive ETCO2, breath sounds checked- equal and bilateral and CO2 detector Secured at: 21 cm Tube secured with: Tape Dental Injury: Teeth and Oropharynx as per pre-operative assessment

## 2022-06-17 NOTE — Op Note (Signed)
OPERATIVE NOTE     PROCEDURE: US guidance for vascular access, bilateral femoral arteries Catheter placement into aorta from bilateral femoral approaches Catheter placement into tertiary branches of the right and left internal iliac arteries 3rd order placement (times 2) from the left femoral approach Placement of a C3 Gore Excluder Endoprosthesis main body 31 x 14 x 17 with a 16 x 10 contralateral limb Placement of a C3 Gore Excluder bifurcated iliac segment 23 x 14 x 10 which is deployed into the right common and external iliac artery Placement of a 23 x 10 Gore extender bridge right side. Placement of a combination of a Gore 16 x 10 extender limb with two Lifestream stents ( 8 mm x 58 mm and 6 mm x 58 mm)  into the left hypogastric artery Placement of a 16 x 10 Extender  Gore limb left external iliac artery. Coil embolization large branch of the right hypogastric aneurysm, to prevent endoleak. Coil embolization of the left hypogastric aneurysm to prevent rupture. Introduction catheter into the left SFA from the right femoral approach. Thrombectomy of the left common femoral and proximal superficial femoral artery using the Penumbra CAT 8 Pro glide closures utilized in a pre-close fashion bilateral common femoral arteries   PRE-OPERATIVE DIAGNOSIS: AAA   POST-OPERATIVE DIAGNOSIS: same   SURGEON: Hortencia Pilar, MD and [name], MD - Co-surgeons   ANESTHESIA: general   ESTIMATED BLOOD LOSS: 150 cc   FINDING(S): 1.  AAA with bilateral common iliac artery aneurysm   SPECIMEN(S):  none   INDICATIONS:   Eugene Henderson is a 72 y.o. y.o. male who presents with an abdominal aortic aneurysm that is now greater than 5 cm and is associated with bilateral common iliac artery aneurysms. The patient will require endovascular repair to prevent lethal rupture.   DESCRIPTION: After obtaining full informed written consent, the patient was brought back to the operating room and placed supine  upon the operating table.  The patient received IV antibiotics prior to induction.  After obtaining adequate anesthesia, the patient was prepped and draped in the standard fashion for endovascular AAA repair.  Co-surgeons are required because this is a complex bilateral procedure with work being performed simultaneously from both the right femoral and left femoral approach.  This also expedites the procedure making a shorter operative time reducing complications and improving patient safety.  We then began by gaining access to both femoral arteries with US guidance with me working on the the right and Dr. Lucky Cowboy working on the the patient's left.  The femoral arteries were found to be patent and accessed without difficulty with a needle under ultrasound guidance without difficulty on each side and permanent images were recorded.  We then placed 2 proglide devices on each side in a pre-close fashion and placed 8 French sheaths.   The patient was then given 8000 units of intravenous heparin.  Additional boluses of heparin were administered throughout the case as needed.   The Pigtail catheter was placed into the aorta from the right side. Image was then obtained demonstrating the right iliac bifurcation. Stiff Amplatz wire was then advanced up both the right and left sides. 39 French sheath was then advanced up the right side and the 8 [place] sheath was exchanged for a 12 French sheath up the left side. Subsequently a Glidewire was advanced up the left side and snared by Dr. Delana Meyer from the right side and pulled extracorporeally. Using this wire which now is traveling from the left groin  over the aortic bifurcation to the right groin the 12 French sheath is advanced and positioned just proximal to the right iliac bifurcation. The right sided 23 mm iliac bifurcation device was then prepped on the back table and advanced through the 16 French sheath and positioned above the right iliac bifurcation. The proximal  portion was then opened and the left groin sheath was advanced into the main portion of the iliac bifurcation device. Dr. Wyn Quaker then used a KMP catheter and a Glidewire the wire catheter combination was negotiate the right internal iliac artery.  KMP catheter was then introduced down into the tertiary branches hand injection contrast was used to localize the first major bifurcation as well as the distal anatomy.  This represents 3rd order catheter placement.  There was a large sidebranch from the right internal iliac that would prevent adequate seal and possibly cause a type Ib endoleak and therefore we elected to coil this sidebranch.  Dr. Wyn Quaker subsequently deployed three 6 mm diameter Ruby coils in addition to a pod 6 Ruby coil in this right hypogastric artery branch.  Follow-up imaging demonstrated excellent coil position with near total cessation of flow indicating successful embolization.  Attention was then shifted to the main distal outflow of the right internal iliac artery.  Subsequently, an Amplatz Super Stiff wire was advanced through the catheter and the catheter removed. Next, the 6 mm x 58 mm length Lifestream stent was deployed into the distal internal iliac artery subsequently a 10 mm diameter by 7 cm length right iliac limb was deployed into the proximal right internal iliac artery to bridge these 2 stents and seal the gap between them and 8 mm x 58 mm Lifestream stent was deployed.  This was then postdilated with a 10 mm balloon proximally and a 14 mm balloon at the confluence to the iliac branch device.  Once this had been achieved the crossing Glidewire was removed the left sided sheath was pulled back into the left common iliac and an stiff angle Glidewire was advanced under fluoroscopic guidance into the descending thoracic aorta. The 12 French sheath on the right was then exchanged for a 18 [place] sheath.    Pigtail catheter was then reintroduced and angiography of the abdominal aorta was  obtained localized in the renal arteries. Using this image, we selected a 31 x 18 Main body device. The main body was then advanced over a stiff wire. A Pigtail catheter was placed up the right side and a magnified image at the renal arteries was performed. The main body was then deployed just below the lowest renal artery. The Kumpe catheter was used to cannulate the contralateral gate without difficulty and successful cannulation was confirmed by twirling the pigtail catheter in the main body. We then placed a stiff wire and a retrograde arteriogram was performed through the right femoral sheath.   A 23 x 10 right iliac limb was selected and advance through the contralateral gate of the main body.  The limb was deployed bridging the main body to the bifurcated iliac device. The main body deployment was then completed. Based off the angiographic findings, extension limbs were necessary.  A 14 mm by 12 cm extender limb was then advanced up the left side and deployed. All remaining junction points and seals zones were treated with the compliant balloon.    The pigtail catheter was then replaced and a completion angiogram was performed.   No Endoleak was detected on completion angiography. The renal arteries were  found to be widely patent.    At this point we elected to terminate the procedure. We secured the pro glide devices for hemostasis on the femoral arteries. The skin incision was closed with a 4-0 Monocryl. Dermabond and pressure dressing were placed. The patient was taken to the recovery room in stable condition having tolerated the procedure well.   COMPLICATIONS: none   CONDITION: stable   Hortencia Pilar [facility] Office: [phone]   06/17/2022, 4:24 PM

## 2022-06-17 NOTE — Anesthesia Preprocedure Evaluation (Addendum)
Anesthesia Evaluation  Patient identified by MRN, date of birth, ID band Patient awake    Reviewed: Allergy & Precautions, NPO status , Patient's Chart, lab work & pertinent test results  History of Anesthesia Complications Negative for: history of anesthetic complications  Airway Mallampati: III   Neck ROM: Full    Dental  (+) Missing, Chipped   Pulmonary asthma , COPD, Current Smoker (5-6 cigarettes per day) and Patient abstained from smoking.   Pulmonary exam normal breath sounds clear to auscultation       Cardiovascular hypertension, +CHF (NICM, EF 25-30%)  Normal cardiovascular exam+ dysrhythmias (a fib on Eliquis)  Rhythm:Regular Rate:Normal  ECG 06/14/22: A fib with RVR; LAFB; no STEMI  TTE 06/15/2021 1. Left ventricular ejection fraction, by estimation, is 20 to 25%. The left ventricle has severely decreased function. The left ventricle demonstrates global hypokinesis. The left ventricular internal cavity size was severely dilated. There is mild left ventricular hypertrophy. Left ventricular diastolic function could not be evaluated.   2. Right ventricular systolic function is severely reduced. The right ventricular size is mildly enlarged. There is moderately elevated pulmonary artery systolic pressure.   3. Left atrial size was severely dilated.   4. Right atrial size was severely dilated.   5. The mitral valve is normal in structure. Moderate mitral valve regurgitation.   6. Tricuspid valve regurgitation is mild to moderate.   7. The aortic valve is tricuspid. Aortic valve regurgitation is trivial. No aortic stenosis is present.   8. Aortic dilatation noted. 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   9. The inferior vena cava is dilated in size with <50% respiratory variability, suggesting right atrial pressure of 15 mmHg.    LHC 2018,  no significant CAD    Neuro/Psych negative neurological ROS     GI/Hepatic negative GI ROS,,,(+) Hepatitis -, C  Endo/Other  diabetes, Type 2  Obesity   Renal/GU negative Renal ROS     Musculoskeletal  (+) Arthritis ,    Abdominal   Peds  Hematology negative hematology ROS (+)   Anesthesia Other Findings Cardiology note 06/16/22:  1. 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  Risk of rupture could be fatal.   2.  Nonischemic cardiomyopathy, EF 25% -Lopressor for now, switch to Toprol-XL upon discharge. -Continue Aldactone, and losartan. -IV Lasix 40 twice daily.   3.  Persistent A-fib -Heart rate is better controlled -Lopressor 25 mg 3 times daily.  Consolidate to Toprol-XL prior to discharge. -Heparin for now, Eliquis upon discharge -Monitor H&H due to history of GI bleeding/diverticular disease.   4.  Thoracic aortic aneurysm, abdominal aortic aneurysm -Did not follow-up with CT surgery as outpatient -Keep appointment with vascular, will need CT referral as outpatient   5.  Current smoker. -Cessation recommended   Reproductive/Obstetrics                             Anesthesia Physical Anesthesia Plan  ASA: 4  Anesthesia Plan: General   Post-op Pain Management:    Induction: Intravenous  PONV Risk Score and Plan: 1 and Ondansetron, Dexamethasone and Treatment may vary due to age or medical condition  Airway Management  Planned: Oral ETT  Additional Equipment:   Intra-op Plan:   Post-operative Plan: Extubation in OR  Informed Consent: I have reviewed the patients History and Physical, chart, labs and discussed the procedure including the risks, benefits and alternatives for the proposed anesthesia with the  patient or authorized representative who has indicated his/her understanding and acceptance.     Dental advisory given  Plan Discussed with: CRNA  Anesthesia Plan Comments: (Patient consented for risks of anesthesia including but not limited to:  - adverse reactions to medications - damage to eyes, teeth, lips or other oral mucosa - nerve damage due to positioning  - sore throat or hoarseness - damage to heart, brain, nerves, lungs, other parts of body or loss of life  Informed patient about role of CRNA in peri- and intra-operative care.  Patient voiced understanding.)        Anesthesia Quick Evaluation

## 2022-06-17 NOTE — Anesthesia Postprocedure Evaluation (Signed)
Anesthesia Post Note  Patient: Eugene Henderson  Procedure(s) Performed: ENDOVASCULAR REPAIR/STENT GRAFT  Patient location during evaluation: Specials Recovery Anesthesia Type: General Level of consciousness: awake and alert Pain management: pain level controlled Vital Signs Assessment: post-procedure vital signs reviewed and stable Respiratory status: spontaneous breathing, nonlabored ventilation, respiratory function stable and patient connected to nasal cannula oxygen Cardiovascular status: blood pressure returned to baseline and stable Postop Assessment: no apparent nausea or vomiting Anesthetic complications: no  No notable events documented.   Last Vitals:  Vitals:   06/17/22 2100 06/17/22 2158  BP: (!) 108/96   Pulse: (!) 32 (!) 101  Resp: 19   Temp:    SpO2: 99%     Last Pain:  Vitals:   06/17/22 2036  TempSrc:   PainSc: [place]

## 2022-06-17 NOTE — Transfer of Care (Signed)
Immediate Anesthesia Transfer of Care Note  Patient: Eugene Henderson  Procedure(s) Performed: ENDOVASCULAR REPAIR/STENT GRAFT  Patient Location: PACU  Anesthesia Type:General  Level of Consciousness: awake  Airway & Oxygen Therapy: Patient Spontanous Breathing and Patient connected to face mask oxygen  Post-op Assessment: Report given to RN and Post -op Vital signs reviewed and stable  Post vital signs: Reviewed and stable  Last Vitals:  Vitals Value Taken Time  BP 108/64 06/17/22 1635  Temp 36.4 C 06/17/22 1635  Pulse 99 06/17/22 1642  Resp 18 06/17/22 1642  SpO2 100 % 06/17/22 1642  Vitals shown include unvalidated device data.  Last Pain:  Vitals:   06/17/22 1051  TempSrc: Oral  PainSc: 0-No pain      Patients Stated Pain Goal: 0 (75/17/00 1749)  Complications: No notable events documented.

## 2022-06-18 ENCOUNTER — Encounter: Payer: Self-pay | Admitting: Vascular Surgery

## 2022-06-18 DIAGNOSIS — R109 Unspecified abdominal pain: Secondary | ICD-10-CM | POA: Diagnosis not present

## 2022-06-18 DIAGNOSIS — Z720 Tobacco use: Secondary | ICD-10-CM

## 2022-06-18 DIAGNOSIS — R079 Chest pain, unspecified: Secondary | ICD-10-CM | POA: Diagnosis not present

## 2022-06-18 DIAGNOSIS — I4811 Longstanding persistent atrial fibrillation: Secondary | ICD-10-CM

## 2022-06-18 DIAGNOSIS — I5020 Unspecified systolic (congestive) heart failure: Secondary | ICD-10-CM | POA: Diagnosis not present

## 2022-06-18 DIAGNOSIS — I10 Essential (primary) hypertension: Secondary | ICD-10-CM

## 2022-06-18 DIAGNOSIS — I4891 Unspecified atrial fibrillation: Secondary | ICD-10-CM | POA: Diagnosis not present

## 2022-06-18 DIAGNOSIS — R1084 Generalized abdominal pain: Secondary | ICD-10-CM | POA: Diagnosis not present

## 2022-06-18 LAB — GLUCOSE, CAPILLARY
Glucose-Capillary: 93 mg/dL (ref 70–99)
Glucose-Capillary: 95 mg/dL (ref 70–99)
Glucose-Capillary: 149 mg/dL — ABNORMAL HIGH (ref 70–99)
Glucose-Capillary: 136 mg/dL — ABNORMAL HIGH (ref 70–99)
Glucose-Capillary: 159 mg/dL — ABNORMAL HIGH (ref 70–99)

## 2022-06-18 LAB — TYPE AND SCREEN
ABO/RH(D): A POS
Antibody Screen: NEGATIVE
Unit division: 0
Unit division: 0

## 2022-06-18 LAB — COMPREHENSIVE METABOLIC PANEL
ALT: 137 U/L — ABNORMAL HIGH (ref 0–44)
AST: 96 U/L — ABNORMAL HIGH (ref 15–41)
Albumin: 3.6 g/dL (ref 3.5–5.0)
Alkaline Phosphatase: 61 U/L (ref 38–126)
Anion gap: 8 (ref 5–15)
BUN: 17 mg/dL (ref 8–23)
CO2: 26 mmol/L (ref 22–32)
Calcium: 8 mg/dL — ABNORMAL LOW (ref 8.9–10.3)
Chloride: 102 mmol/L (ref 98–111)
Creatinine, Ser: 0.81 mg/dL (ref 0.61–1.24)
GFR, Estimated: >60 mL/min (ref >60)
Glucose, Bld: 146 mg/dL — ABNORMAL HIGH (ref 70–99)
Potassium: 4.1 mmol/L (ref 3.5–5.1)
Sodium: 136 mmol/L (ref 135–145)
Total Bilirubin: 1.1 mg/dL (ref 0.3–1.2)
Total Protein: 6.7 g/dL (ref 6.5–8.1)

## 2022-06-18 LAB — BPAM RBC
Blood Product Expiration Date: 202402142359
Blood Product Expiration Date: 202402142359
Unit Type and Rh: 6200
Unit Type and Rh: 6200

## 2022-06-18 LAB — CBC
HCT: 38.4 % — ABNORMAL LOW (ref 39.0–52.0)
Hemoglobin: 12.4 g/dL — ABNORMAL LOW (ref 13.0–17.0)
MCH: 29 pg (ref 26.0–34.0)
MCHC: 32.3 g/dL (ref 30.0–36.0)
MCV: 89.9 fL (ref 80.0–100.0)
Platelets: 149 10*3/uL — ABNORMAL LOW (ref 150–400)
RBC: 4.27 MIL/uL (ref 4.22–5.81)
RDW: 14.7 % (ref 11.5–15.5)
WBC: 6.1 10*3/uL (ref 4.0–10.5)
nRBC: 0 % (ref 0.0–0.2)

## 2022-06-18 LAB — PREPARE RBC (CROSSMATCH)

## 2022-06-18 LAB — HEPARIN LEVEL (UNFRACTIONATED)
Heparin Unfractionated: 0.31 IU/mL (ref 0.30–0.70)
Heparin Unfractionated: 0.28 IU/mL — ABNORMAL LOW (ref 0.30–0.70)

## 2022-06-18 MED ORDER — HEPARIN BOLUS VIA INFUSION
2100.0000 [IU] | Freq: Once | INTRAVENOUS | Status: AC
  Administered 2022-06-18: 2100 [IU] via INTRAVENOUS
  Filled 2022-06-18: qty 2100

## 2022-06-18 MED ORDER — MORPHINE SULFATE (PF) 2 MG/ML IV SOLN
2.0000 mg | INTRAVENOUS | Status: DC | PRN
  Administered 2022-06-18 – 2022-06-25 (×3): 2 mg via INTRAVENOUS
  Filled 2022-06-18 (×3): qty 1

## 2022-06-18 MED ORDER — OXYCODONE HCL 5 MG PO TABS
5.0000 mg | ORAL_TABLET | Freq: Four times a day (QID) | ORAL | Status: DC | PRN
  Administered 2022-06-18 – 2022-06-24 (×16): 5 mg via ORAL
  Filled 2022-06-18 (×16): qty 1

## 2022-06-18 MED ORDER — ORAL CARE MOUTH RINSE: 15.0000 mL | OROMUCOSAL | Status: DC | PRN

## 2022-06-18 NOTE — Consult Note (Signed)
ANTICOAGULATION CONSULT NOTE  Pharmacy Consult for Heparin Infusion Indication: atrial fibrillation  No Known Allergies  Patient Measurements: Height: 6' (182.9 cm) Weight: 124.3 kg (274 lb 0.5 oz) IBW/kg (Calculated) : 77.6 Heparin Dosing Weight: 105.2 kg  Vital Signs: Temp: 98.4 F (36.9 C) (01/12 0200) Temp Source: Oral (01/12 0200) BP: 125/81 (01/12 0700) Pulse Rate: 66 (01/12 0700)  Labs: Recent Labs    06/15/22 1710 06/16/22 0447 06/16/22 0447 06/16/22 1816 06/17/22 0217 06/17/22 1730 06/18/22 0459  HGB  --  13.6   < >  --  13.1 12.8* 12.4*  HCT  --  41.4   < >  --  39.3 39.5 38.4*  PLT  --  148*   < >  --  142* 135* 149*  APTT 31  --   --   --   --  181*  --   LABPROT  --   --   --   --   --  16.2*  --   INR  --   --   --   --   --  1.3*  --   HEPARINUNFRC  --  0.18*  --  0.37 0.60  --   --   CREATININE  --  1.17   < >  --  0.96 0.87 0.81   < > = values in this interval not displayed.     Estimated Creatinine Clearance: 113.9 mL/min (by C-G formula based on SCr of 0.81 mg/dL).   Medical History: Past Medical History:  Diagnosis Date   Arthritis    Ascending aortic aneurysm ([place])    a. 09/2017 Stable TAA - 5.1cm.   Asthma    Chronic systolic CHF (congestive heart failure) ([place])    a. EF 25-30% by echo in 07/2016 with cath showing no significant CAD b. 01/2017: EF 30-35% with diffuse HK and moderate MR; c. 07/2017 Echo: EF 30-35%, diff hK. Mild MR, mildly dil LA. PASP 56mmHg.   Diverticulitis    Diverticulitis of large intestine with perforation without abscess or bleeding 05/13/2017   Hypertension    Hyperthyroidism    NICM (nonischemic cardiomyopathy) ([place])    Noncompliance    Persistent atrial fibrillation (HCC)    a. CHA2DS2VASc = 3-->Eliquis (? compliance).    Medications:  Scheduled:   aspirin EC  81 mg Oral Q0600   Chlorhexidine Gluconate Cloth  6 each Topical Q0600   docusate sodium  100 mg Oral Daily   furosemide  40 mg Intravenous BID    irbesartan  150 mg Oral Daily   metoprolol tartrate  25 mg Oral TID   mometasone-formoterol  2 puff Inhalation BID   pantoprazole (PROTONIX) IV  40 mg Intravenous Q12H   potassium chloride  40 mEq Oral BID   spironolactone  25 mg Oral Daily   thiamine  100 mg Oral Daily   varenicline  1 mg Oral Daily   Infusions:   sodium chloride Stopped (06/15/22 0717)   sodium chloride 75 mL/hr at 06/18/22 0620   sodium chloride     DOPamine Stopped (06/17/22 1933)   famotidine (PEPCID) IV     heparin 1,850 Units/hr (06/18/22 0700)   magnesium sulfate bolus IVPB     nitroGLYCERIN     PRN: sodium chloride, sodium chloride, acetaminophen **OR** acetaminophen, albuterol, alum & mag hydroxide-simeth, bisacodyl, fentaNYL (SUBLIMAZE) injection, guaiFENesin-dextromethorphan, hydrALAZINE, HYDROmorphone (DILAUDID) injection, labetalol, LORazepam, magnesium citrate, magnesium sulfate bolus IVPB, metoprolol tartrate, morphine injection, nitroGLYCERIN, ondansetron **OR** ondansetron (ZOFRAN) IV, oxyCODONE-acetaminophen,  phenol, polyethylene glycol, potassium chloride, QUEtiapine  Assessment: Eugene Henderson is a 72 y.o. male with PMH significant for HFrEF secondary to ischemic cardiomyopathy, AF, throacic and abdominal aortic anuerysms, GIB in 2021 prompting discontinuation of apixaban. Patient not on anticoagulation PTA. He presented to the ED with chest pain and SOB x 3 days. Heparin infusion was paused for endovascular AAA repair on 1/11. Pharmacy has been consulted to initiate and titrate heparin infusion while admitted.  Baseline labs: aPTT 31, INR 1.0, Plt 169, Hgb 13.6  Goal of Therapy:  Heparin level 0.3-0.7 units/ml Monitor platelets by anticoagulation protocol: Yes   Date Time HL Rate/Comment  1/10 1816  0.37 1850/therapeutic x1 1/11 0217 0.60 1850/therapeutic x2 1/11 1300 --- 0/hold for procedure 1/11 2317 --- 1850/resume per vascular  1/12 0709 0.31 1850/therapeutic x1  Plan: Continue  heparin infusion at 1850 units/hr Check HL in 8 hours to confirm Continue to monitor H&H and platelets daily while on heparin infusion   Eugene Henderson, PharmD PGY1 Pharmacy Resident 06/18/2022 7:10 AM

## 2022-06-18 NOTE — Progress Notes (Signed)
Progress Note    06/18/2022 12:13 PM 1 Day Post-Op  Subjective:  72 year old male with PMH of HTN, HLD, type II DM, tobacco abuse, presented to the ED on 06/14/2022 with chest pain and dyspnea x 3 days. 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  Patient is now POD #1 from a pelvic angiography of bilateral iliac arteries with coil embolization and stent placement with mechanical thrombectomy of the left femoral artery from the right femoral approach.  Upon exam this morning patient was resting comfortably lying flat in bed.  He states that both bilateral groins are sore but otherwise he is not having much pain.  He states his abdomen feels better.  Denies any pain.   Vitals:   06/18/22 0943 06/18/22 1000  BP:  119/81  Pulse: (!) 122 (!) 113  Resp:    Temp:    SpO2:  93%   Physical Exam: Cardiac:  Irregular without Murmurs or gallop Lungs:  Bilateral lower rales. Diminished in the bases.  Incisions:  Bilateral groin puncture sites without hematoma or seroma.  Extremities:  Bilateral lower extremities have positive pulses and are warm to the touch.  Abdomen:  Positive bowel sounds, soft, flat NT/ND Neurologic: AAOX3 Person, place and date.   CBC    Component Value Date/Time   WBC 6.1 06/18/2022 0459   RBC 4.27 06/18/2022 0459   HGB 12.4 (L) 06/18/2022 0459   HGB 14.3 06/18/2021 1409   HCT 38.4 (L) 06/18/2022 0459   HCT 42.1 06/18/2021 1409   PLT 149 (L) 06/18/2022 0459   PLT 154 06/18/2021 1409   MCV 89.9 06/18/2022 0459   MCV 86 06/18/2021 1409   MCH 29.0 06/18/2022 0459   MCHC 32.3 06/18/2022 0459   RDW 14.7 06/18/2022 0459   RDW 15.1 06/18/2021 1409   LYMPHSABS 1.3 06/18/2021 1409   MONOABS 0.7 05/14/2017 0351   EOSABS 0.1 06/18/2021 1409   BASOSABS 0.0 06/18/2021 1409    BMET    Component Value Date/Time   NA 136 06/18/2022 0459   NA 139 06/18/2021 1409   K 4.1  06/18/2022 0459   CL 102 06/18/2022 0459   CO2 26 06/18/2022 0459   GLUCOSE 146 (H) 06/18/2022 0459   BUN 17 06/18/2022 0459   BUN 13 06/18/2021 1409   CREATININE 0.81 06/18/2022 0459   CALCIUM 8.0 (L) 06/18/2022 0459   GFRNONAA >60 06/18/2022 0459   GFRAA 96 03/28/2020 1524    INR    Component Value Date/Time   INR 1.3 (H) 06/17/2022 1730     Intake/Output Summary (Last 24 hours) at 06/18/2022 1213 Last data filed at 06/18/2022 1100 Gross per 24 hour  Intake 4218.56 ml  Output 5040 ml  Net -821.44 ml     Assessment/Plan:  72 y.o. male is s/p POD #1 from a pelvic angiography of bilateral iliac arteries with coil embolization and stent placement with mechanical thrombectomy of the left femoral artery from the right femoral approach. 1 Day Post-Op   Assessment: On assessment this morning patient is lying comfortable resting in bed.  He denies any pain and has no complications to note. Bilateral groins have dressings intact that are clean and dry.  No hematoma seroma to note.  Bilateral lower extremities are warm to touch.  He has palpable pulses both PT and DP bilaterally. Plan: Patient is recovering as expected post procedure.  He is doing well enough to be moved out of ICU today.  Per vascular surgery patient can be converted from heparin drip today to aspirin 81 mg p.o. daily and Eliquis 5 mg p.o. twice daily.  No further interventions planned at this time.  Continue to follow.   Drema [facility] 06/18/2022 12:13 PM

## 2022-06-18 NOTE — Progress Notes (Signed)
Rounding Note    Patient Name: Eugene Henderson Date of Encounter: 06/18/2022  [place] Cardiologist: Kathlyn Sacramento, MD   Subjective   No complaints this morning, lying supine Reports tolerating vascular procedure yesterday Underwent coil embolization and stent placement with mechanical thrombectomy of the left femoral artery via right femoral approach Denies significant pain  Telemetry reviewed, atrial fibrillation rate 90-100, on heparin infusion  Inpatient Medications    Scheduled Meds:  aspirin EC  81 mg Oral Q0600   Chlorhexidine Gluconate Cloth  6 each Topical Q0600   docusate sodium  100 mg Oral Daily   furosemide  40 mg Intravenous BID   heparin  2,100 Units Intravenous Once   irbesartan  150 mg Oral Daily   metoprolol tartrate  25 mg Oral TID   mometasone-formoterol  2 puff Inhalation BID   pantoprazole (PROTONIX) IV  40 mg Intravenous Q12H   spironolactone  25 mg Oral Daily   thiamine  100 mg Oral Daily   varenicline  1 mg Oral Daily   Continuous Infusions:  sodium chloride Stopped (06/15/22 0717)   sodium chloride     heparin 1,850 Units/hr (06/18/22 1400)   magnesium sulfate bolus IVPB     PRN Meds: sodium chloride, sodium chloride, acetaminophen **OR** acetaminophen, albuterol, alum & mag hydroxide-simeth, bisacodyl, guaiFENesin-dextromethorphan, hydrALAZINE, labetalol, LORazepam, magnesium citrate, magnesium sulfate bolus IVPB, metoprolol tartrate, morphine injection, nitroGLYCERIN, ondansetron **OR** ondansetron (ZOFRAN) IV, mouth rinse, oxyCODONE, phenol, polyethylene glycol, potassium chloride, QUEtiapine   Vital Signs    Vitals:   06/18/22 1300 06/18/22 1320 06/18/22 1400 06/18/22 1500  BP:  93/79 109/84 (!) 90/56  Pulse: 100 100 (!) 119 84  Resp: (!) 23 20 17  (!) 30  Temp:      TempSrc:      SpO2: (!) 86% 94% 98% 98%  Weight:      Height:        Intake/Output Summary (Last 24 hours) at 06/18/2022 1629 Last data filed at  06/18/2022 1400 Gross per 24 hour  Intake 2767.79 ml  Output 4405 ml  Net -1637.21 ml      06/17/2022    6:15 PM 06/17/2022   10:51 AM 06/15/2022    2:38 AM  Last 3 Weights  Weight (lbs) 274 lb 0.5 oz 270 lb 274 lb 0.5 oz  Weight (kg) 124.3 kg 122.471 kg 124.3 kg      Telemetry    Atrial fibrillation rate 90-100- Personally Reviewed  ECG     - Personally Reviewed  Physical Exam   GEN: No acute distress.   Neck: No JVD Cardiac: Irregularly irregular no murmurs, rubs, or gallops.  Respiratory: Clear to auscultation bilaterally. GI: Soft, nontender, non-distended  MS: No edema; No deformity. Neuro:  Nonfocal  Psych: Normal affect   Labs    High Sensitivity Troponin:   Recent Labs  Lab 06/14/22 1224 06/14/22 1413  TROPONINIHS 25* 28*     Chemistry Recent Labs  Lab 06/14/22 1835 06/15/22 0304 06/16/22 0447 06/17/22 0217 06/17/22 1730 06/18/22 0459  NA  --    < > 135 130* 135 136  K  --    < > 4.0 3.7 3.5 4.1  CL  --    < > 98 97* 98 102  CO2  --    < > 26 23 27 26   GLUCOSE  --    < > 88 102* 115* 146*  BUN  --    < > 34* 33* 22 17  CREATININE  --    < > 1.17 0.96 0.87 0.81  CALCIUM  --    < > 8.7* 8.2* 7.9* 8.0*  MG 2.0  --   --   --   --   --   PROT  --    < > 7.6 7.1  --  6.7  ALBUMIN  --    < > 4.1 3.8  --  3.6  AST  --    < > 122* 197*  --  96*  ALT  --    < > 88* 164*  --  137*  ALKPHOS  --    < > 70 69  --  61  BILITOT  --    < > 1.4* 1.7*  --  1.1  GFRNONAA  --    < > >60 >60 >60 >60  ANIONGAP  --    < > 11 10 10 8    < > = values in this interval not displayed.    Lipids No results for input(s): "CHOL", "TRIG", "HDL", "LABVLDL", "LDLCALC", "CHOLHDL" in the last 168 hours.  Hematology Recent Labs  Lab 06/17/22 0217 06/17/22 1730 06/18/22 0459  WBC 5.5 5.0 6.1  RBC 4.52 4.47 4.27  HGB 13.1 12.8* 12.4*  HCT 39.3 39.5 38.4*  MCV 86.9 88.4 89.9  MCH 29.0 28.6 29.0  MCHC 33.3 32.4 32.3  RDW 14.3 14.6 14.7  PLT 142* 135* 149*   Thyroid No  results for input(s): "TSH", "FREET4" in the last 168 hours.  BNP Recent Labs  Lab 06/14/22 1321  BNP 575.5*    DDimer No results for input(s): "DDIMER" in the last 168 hours.   Radiology    PERIPHERAL VASCULAR CATHETERIZATION  Result Date: 06/17/2022 See surgical note for result.   Cardiac Studies   Echo Left ventricular ejection fraction, by estimation, is 20 to 25%. The  left ventricle has severely decreased function. The left ventricle  demonstrates global hypokinesis. The left ventricular internal cavity size  was severely dilated. There is mild  left ventricular hypertrophy. Left ventricular diastolic function could  not be evaluated.   2. Right ventricular systolic function is severely reduced. The right  ventricular size is mildly enlarged. There is moderately elevated  pulmonary artery systolic pressure.   3. Left atrial size was severely dilated.   4. Right atrial size was severely dilated.   5. The mitral valve is normal in structure. Moderate mitral valve  regurgitation.   6. Tricuspid valve regurgitation is mild to moderate.   7. The aortic valve is tricuspid. Aortic valve regurgitation is trivial.  No aortic stenosis is present.   8. Aortic dilatation noted. 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 There is severe dilatation of the ascending aorta,  measuring 50 mm.   9. The inferior vena cava is dilated in size with <50% respiratory  variability, suggesting right atrial pressure of 15 mmHg.   Patient Profile     72 y.o. male with history of NICM EF 25%, persistent atrial fibrillation, hypertension, thoracic aortic aneurysm, iliac aneurysm, abdominal aortic aneurysm, COPD, smoker presenting with chest pain.  Diagnosed with significant left iliac aneurysm, intervention being planned.  Being seen for cardiomyopathy and A-fib RVR.   Assessment & Plan   PAD Status post coil embolization and stent placement with mechanical thrombectomy of  the left femoral artery via right femoral approach -- Postop day 1, tolerated procedure well Plan is to transition heparin infusion to Eliquis  Nonischemic cardiomyopathy Severely depressed ejection fraction dating back to 2018 at that time EF 25 to 30%, Echocardiogram this admission detailing EF 20 to 25% Presenting this admission with increasing shortness of breath, leg swelling Noncompliance with his medications -On Lasix IV twice daily, irbesartan 150 daily metoprolol tartrate 25 twice daily, spironolactone 25 daily Stable renal function and electrolytes  Atrial fibrillation with RVR Medication noncompliant as outpatient Plan transition from heparin infusion to Eliquis Rate relatively well-controlled on metoprolol tartrate 25 twice daily Will hold off on transition to metoprolol succinate given hypotension  Ascending aorta dilation Previously referral to CT surgery, no showed for his appointments, was lost to follow-up -ascending portion measuring 4.9 cm at the sinotubular junction, 4.8 cm at the ascending portion at the level of the main pulmonary artery, focal dilatation of the proximal abdominal aorta measuring 3.8 cm associated with eccentric mural thrombus unchanged from prior CT,   Smoker/alcohol abuse Cessation recommended, reports approximately 12 beers per week, half pack cigarettes per day   Total encounter time more than 50 minutes  Greater than 50% was spent in counseling and coordination of care with the patient   For questions or updates, please contact [facility] Please consult [email or web address] for contact info under        Signed, Julien Nordmann, MD  06/18/2022, 4:29 PM

## 2022-06-18 NOTE — Progress Notes (Signed)
   06/18/22 1700  Spiritual Encounters  Type of Visit Initial  Care provided to: Patient  Referral source Chaplain team  Reason for visit Routine spiritual support  OnCall Visit Yes   Chaplain provided compassionate presence and reflective listening as patient shared about his faith and health challenges. Chaplain provided prayer as requested. New Chaplain resident present in training. Chaplain services are available as needed.

## 2022-06-18 NOTE — Progress Notes (Addendum)
PROGRESS NOTE   Eugene Henderson  DGL:[id number]    DOB: 03-08-1951    DOA: 06/14/2022  PCP: Dorcas Carrow, DO   I have briefly reviewed patients previous medical records in [facility].  Chief Complaint  Patient presents with   Chest Pain   Shortness of Breath    Brief Narrative:  72 year old male with PMH of HTN, HLD, type II DM, tobacco abuse, presented to the ED on 06/14/2022 with chest pain and dyspnea x 3 days.  He was initially admitted with diagnosis of abdominal pain.  No choledocholithiasis by imaging and GI signed off.  Cardiology consulted for perioperative CV risk assessment in anticipation of AAA and iliac aneurysm repairs, acute on chronic systolic CHF and A-fib.  Hospital course complicated by delirium.  He then underwent endovascular procedures for his AAA and iliac artery aneurysm on 1/11, postprocedure was admitted to ICU.   Assessment & Plan:  Principal Problem:   Abdominal pain Active Problems:   Persistent atrial fibrillation (HCC)   Essential hypertension   Tobacco use   Chest pain   Diabetes mellitus without complication (HCC)   Morbid obesity (HCC)   AF (paroxysmal atrial fibrillation) (HCC)   Hyperlipidemia   Metal foreign body in head   Elevated bilirubin   Atrial fibrillation with RVR (HCC)   Thoracoabdominal aortic aneurysm (TAAA) (HCC)   HFrEF (heart failure with reduced ejection fraction) (HCC)   NICM (nonischemic cardiomyopathy) (HCC)   Pre-operative cardiovascular examination   Abdominal pain: This was his admitting presentation.  Negative workup for choledocholithiasis..  GI recommended outpatient follow-up for cirrhosis, twice daily Protonix x 8 to 12 weeks, avoiding NSAIDs, no EGD was recommended and GI signed off.  Overall etiology unclear but wide DD: Gastritis, PUD, passive hepatic congestion from decompensated CHF, AAA etc.  No abdominal pain reported today.  Acute respiratory distress Present on admission.  Suspected due to  decompensated CHF.  Currently saturating in the high 90s-100s on 2 L/min [place] oxygen.  Could have underlying OSA/OHS based on body habitus.  This will have to be evaluated as outpatient.  Wean oxygen as tolerated for sats >92%.  Incentive spirometry.  Upon careful review of chart since admission, no clear hypoxia documented despite patient having dyspnea and tachypnea.  Thereby acute respiratory failure with hypoxia ruled out.  Acute on chronic systolic CHF/nonischemic cardiomyopathy: 2D echo 2022: LVEF 30%.  Repeat echo this admission: LVEF 20-25%.  Prior left heart cath with no CAD.  Remains on IV Lasix 40 mg twice daily.  -1.56 L thus far.  Neither intake output nor weight seems to be accurate.  Clinically appears mildly volume overloaded.  Cardiology following.  Persistent A-fib: Currently on IV heparin post endovascular procedures, transition to Eliquis pending clearance from vascular surgery.  Rate is controlled.  Was not on anticoagulation PTA.  Continue metoprolol 25 Mg 3 times daily, to be consolidated to Toprol-XL prior to discharge.  Cardiology following.  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 Abdomen CTA revealed several abnormalities 1/8 (see for detailed report).  Following Cardiology preop clearance, patient underwent endovascular treatment of AAA and iliac artery aneurysms by vascular surgery on 1/11.  As per discussion with vascular surgery team this morning, okay to transfer out of ICU.  Patient will need follow up with CT surgery after discharge for thoracic aortic aneurysm.    Delirium event overnight 1/9.  As per ICU RN,  no recurrence of delirium last night and did not have to give Ativan for Seroquel.   Essential hypertension Reasonably controlled.  Continue irbesartan 150 Mg daily, metoprolol tartrate 25 Mg 3 times daily, spironolactone 25 Mg daily.     Metal foreign body in head non  acute issue.  Outpatient follow-up.   Hyperlipidemia Continued statin.  Thrombocytopenia: May be related to heparin drip.  Follow daily CBCs.  Mild transaminitis: Unclear etiology.  Stable over the last 3 days.  Continue to monitor daily CMP.   Body mass index is 37.17 kg/m./Morbid obesity This meets criteria for morbid obesity based on the presence of 1 or more chronic comorbidities. Patient has hypertension. This complicates overall care and prognosis.    DVT prophylaxis: Place TED hose Start: 06/14/22 1835.  Currently on IV heparin.   Code Status: Full Code:  Family Communication: None at bedside Disposition:  Status is: Inpatient Remains inpatient appropriate because: Just had endovascular procedure, remains on IV heparin which needs to be transition to Eliquis.  Hopeful discharge in the next 1 to 2 days.     Consultants:   Gastroenterology Cardiology Vascular surgery  Procedures:   As noted above  Antimicrobials:      Subjective:  Appears hard of hearing and may be somewhat confused at times or this may be his baseline.  He is new to me.  Denies complaints.  Specifically no chest pain or dyspnea.  Indicates that he does not use home oxygen but does use inhalers.  Objective:   Vitals:   06/18/22 0500 06/18/22 0600 06/18/22 0700 06/18/22 0800  BP: (!) 132/92 112/79 125/81 111/77  Pulse: 69 92 66 96  Resp: 19 (!) 24 (!) 21 17  Temp:    98.4 F (36.9 C)  TempSrc:    Oral  SpO2: 99% 100% 100% 99%  Weight:      Height:        General exam: Elderly male, moderately built and obese lying comfortably propped up in bed without distress. Respiratory system: Slightly breath sounds in the bases with occasional basal crackles otherwise clear to auscultation.  No increased work of breathing. Cardiovascular system: S1 & S2 heard, RRR. No JVD, murmurs, rubs, gallops or clicks. No pedal edema.  Telemetry personally reviewed: A-fib with ventricular rate in the  90s-100s. Gastrointestinal system: Abdomen is nondistended, soft and nontender. No organomegaly or masses felt. Normal bowel sounds heard.  Bilateral groin endovascular interventions sites without acute findings. GU: Has indwelling Foley catheter. Central nervous system: Alert and oriented. No focal neurological deficits. Extremities: Symmetric 5 x 5 power. Skin: No rashes, lesions or ulcers Psychiatry: Judgement and insight appear somewhat impaired. Mood & affect appropriate.     Data Reviewed:   I have personally reviewed following labs and imaging studies   CBC: Recent Labs  Lab 06/17/22 0217 06/17/22 1730 06/18/22 0459  WBC 5.5 5.0 6.1  HGB 13.1 12.8* 12.4*  HCT 39.3 39.5 38.4*  MCV 86.9 88.4 89.9  PLT 142* 135* 149*    Basic Metabolic Panel: Recent Labs  Lab 06/14/22 1835 06/15/22 0304 06/16/22 0447 06/17/22 0217 06/17/22 1730 06/18/22 0459  NA  --  134* 135 130* 135 136  K  --  4.1 4.0 3.7 3.5 4.1  CL  --  99 98 97* 98 102  CO2  --  23 26 23 27 26   GLUCOSE  --  134* 88 102* 115* 146*  BUN  --  21 34* 33* 22 17  CREATININE  --  1.00 1.17 0.96 0.87 0.81  CALCIUM  --  9.0 8.7* 8.2* 7.9* 8.0*  MG 2.0  --   --   --   --   --   PHOS 4.2  --   --   --   --   --     Liver Function Tests: Recent Labs  Lab 06/14/22 1413 06/15/22 0304 06/16/22 0447 06/17/22 0217 06/18/22 0459  AST 23 39 122* 197* 96*  ALT 20 31 88* 164* 137*  ALKPHOS 64 68 70 69 61  BILITOT 1.4* 1.8* 1.4* 1.7* 1.1  PROT 7.5 8.0 7.6 7.1 6.7  ALBUMIN 4.0 4.3 4.1 3.8 3.6    CBG: Recent Labs  Lab 06/17/22 2316 06/18/22 0307 06/18/22 0714  GLUCAP 149* 136* 159*    Microbiology Studies:   Recent Results (from the past 240 hour(s))  Resp panel by RT-PCR (RSV, Flu A&B, Covid) Anterior Nasal Swab     Status: None   Collection Time: 06/14/22  2:16 PM   Specimen: Anterior Nasal Swab  Result Value Ref Range Status   SARS Coronavirus 2 by RT PCR NEGATIVE NEGATIVE Final    Comment:  (NOTE) SARS-CoV-2 target nucleic acids are NOT DETECTED.  The SARS-CoV-2 RNA is generally detectable in upper respiratory specimens during the acute phase of infection. The lowest concentration of SARS-CoV-2 viral copies this assay can detect is 138 copies/mL. A negative result does not preclude SARS-Cov-2 infection and should not be used as the sole basis for treatment or other patient management decisions. A negative result may occur with  improper specimen collection/handling, submission of specimen other than nasopharyngeal swab, presence of viral mutation(s) within the areas targeted by this assay, and inadequate number of viral copies(<138 copies/mL). A negative result must be combined with clinical observations, patient history, and epidemiological information. The expected result is Negative.  Fact Sheet for Patients:  EntrepreneurPulse.com.au  Fact Sheet for Healthcare Providers:  IncredibleEmployment.be  This test is no t yet approved or cleared by the [place] FDA and  has been authorized for detection and/or diagnosis of SARS-CoV-2 by FDA under an Emergency Use Authorization (EUA). This EUA will remain  in effect (meaning this test can be used) for the duration of the COVID-19 declaration under Section 564(b)(1) of the Act, 21 U.S.C.section 360bbb-3(b)(1), unless the authorization is terminated  or revoked sooner.       Influenza A by PCR NEGATIVE NEGATIVE Final   Influenza B by PCR NEGATIVE NEGATIVE Final    Comment: (NOTE) The Xpert Xpress SARS-CoV-2/FLU/RSV plus assay is intended as an aid in the diagnosis of influenza from Nasopharyngeal swab specimens and should not be used as a sole basis for treatment. Nasal washings and aspirates are unacceptable for Xpert Xpress SARS-CoV-2/FLU/RSV testing.  Fact Sheet for Patients: EntrepreneurPulse.com.au  Fact Sheet for Healthcare  Providers: IncredibleEmployment.be  This test is not yet approved or cleared by the [place] FDA and has been authorized for detection and/or diagnosis of SARS-CoV-2 by FDA under an Emergency Use Authorization (EUA). This EUA will remain in effect (meaning this test can be used) for the duration of the COVID-19 declaration under Section 564(b)(1) of the Act, 21 U.S.C. section 360bbb-3(b)(1), unless the authorization is terminated or revoked.     Resp Syncytial Virus by PCR NEGATIVE NEGATIVE Final    Comment: (NOTE) Fact Sheet for Patients: EntrepreneurPulse.com.au  Fact Sheet for Healthcare Providers: IncredibleEmployment.be  This test is not yet approved or cleared by the [place] FDA and has  been authorized for detection and/or diagnosis of SARS-CoV-2 by FDA under an Emergency Use Authorization (EUA). This EUA will remain in effect (meaning this test can be used) for the duration of the COVID-19 declaration under Section 564(b)(1) of the Act, 21 U.S.C. section 360bbb-3(b)(1), unless the authorization is terminated or revoked.  Performed at [facility], [address] [place], [place] [address]   Culture, blood (Routine X 2) w Reflex to ID Panel     Status: None (Preliminary result)   Collection Time: 06/14/22  6:56 PM   Specimen: BLOOD RIGHT ARM  Result Value Ref Range Status   Specimen Description BLOOD RIGHT ARM  Final   Special Requests   Final    BOTTLES DRAWN AEROBIC AND ANAEROBIC Blood Culture adequate volume   Culture   Final    NO GROWTH 4 DAYS Performed at [facility], [address] [place], [place] [address]    Report Status PENDING  Incomplete  Culture, blood (Routine X 2) w Reflex to ID Panel     Status: None (Preliminary result)   Collection Time: 06/14/22  6:56 PM   Specimen: BLOOD  Result Value Ref Range Status   Specimen Description BLOOD RIGHT HAND  Final    Special Requests   Final    BOTTLES DRAWN AEROBIC AND ANAEROBIC Blood Culture adequate volume   Culture   Final    NO GROWTH 4 DAYS Performed at [facility], [address], [place], [place] [address]    Report Status PENDING  Incomplete  MRSA Next Gen by PCR, Nasal     Status: None   Collection Time: 06/17/22  6:21 PM   Specimen: Nasal Mucosa; Nasal Swab  Result Value Ref Range Status   MRSA by PCR Next Gen NOT DETECTED NOT DETECTED Final    Comment: (NOTE) The GeneXpert MRSA Assay (FDA approved for NASAL specimens only), is one component of a comprehensive MRSA colonization surveillance program. It is not intended to diagnose MRSA infection nor to guide or monitor treatment for MRSA infections. Test performance is not FDA approved in patients less than 33 years old. Performed at [facility], [address] [place], [place] [address]     Radiology Studies:  PERIPHERAL VASCULAR CATHETERIZATION  Result Date: 06/17/2022 See surgical note for result.   Scheduled Meds:    aspirin EC  81 mg Oral Q0600   Chlorhexidine Gluconate Cloth  6 each Topical Q0600   docusate sodium  100 mg Oral Daily   furosemide  40 mg Intravenous BID   irbesartan  150 mg Oral Daily   metoprolol tartrate  25 mg Oral TID   mometasone-formoterol  2 puff Inhalation BID   pantoprazole (PROTONIX) IV  40 mg Intravenous Q12H   potassium chloride  40 mEq Oral BID   spironolactone  25 mg Oral Daily   thiamine  100 mg Oral Daily   varenicline  1 mg Oral Daily    Continuous Infusions:    sodium chloride Stopped (06/15/22 0717)   sodium chloride 75 mL/hr at 06/18/22 0800   sodium chloride     DOPamine Stopped (06/17/22 1933)   famotidine (PEPCID) IV     heparin 1,850 Units/hr (06/18/22 0800)   magnesium sulfate bolus IVPB     nitroGLYCERIN       LOS: 4 days     Marcellus Scott, MD,  FACP, FHM, [facility], [facility], [facility]   [facility] Physician Advisor [facility]  To contact the attending provider between 7A-7P or the covering provider during after hours 7P-7A, please log into the web site [email or web address] and access using universal [place] password for that web site. If you do not have the password, please call the hospital operator.  06/18/2022, 8:50 AM

## 2022-06-18 NOTE — Consult Note (Signed)
ANTICOAGULATION CONSULT NOTE  Pharmacy Consult for Heparin Infusion Indication: atrial fibrillation  No Known Allergies  Patient Measurements: Height: 6' (182.9 cm) Weight: 124.3 kg (274 lb 0.5 oz) IBW/kg (Calculated) : 77.6 Heparin Dosing Weight: 105.2 kg  Vital Signs: Temp: 98.4 F (36.9 C) (01/12 1035) Temp Source: Oral (01/12 1035) BP: 90/56 (01/12 1500) Pulse Rate: 84 (01/12 1500)  Labs: Recent Labs    06/15/22 1710 06/16/22 0447 06/17/22 0217 06/17/22 1730 06/18/22 0459 06/18/22 0709 06/18/22 1536  HGB  --    < > 13.1 12.8* 12.4*  --   --   HCT  --    < > 39.3 39.5 38.4*  --   --   PLT  --    < > 142* 135* 149*  --   --   APTT 31  --   --  181*  --   --   --   LABPROT  --   --   --  16.2*  --   --   --   INR  --   --   --  1.3*  --   --   --   HEPARINUNFRC  --    < > 0.60  --   --  0.31 0.28*  CREATININE  --    < > 0.96 0.87 0.81  --   --    < > = values in this interval not displayed.     Estimated Creatinine Clearance: 113.9 mL/min (by C-G formula based on SCr of 0.81 mg/dL).   Medical History: Past Medical History:  Diagnosis Date   Arthritis    Ascending aortic aneurysm ([place])    a. 09/2017 Stable TAA - 5.1cm.   Asthma    Chronic systolic CHF (congestive heart failure) ([place])    a. EF 25-30% by echo in 07/2016 with cath showing no significant CAD b. 01/2017: EF 30-35% with diffuse HK and moderate MR; c. 07/2017 Echo: EF 30-35%, diff hK. Mild MR, mildly dil LA. PASP 39mmHg.   Diverticulitis    Diverticulitis of large intestine with perforation without abscess or bleeding 05/13/2017   Hypertension    Hyperthyroidism    NICM (nonischemic cardiomyopathy) ([place])    Noncompliance    Persistent atrial fibrillation (HCC)    a. CHA2DS2VASc = 3-->Eliquis (? compliance).    Medications:  Scheduled:   aspirin EC  81 mg Oral Q0600   Chlorhexidine Gluconate Cloth  6 each Topical Q0600   docusate sodium  100 mg Oral Daily   furosemide  40 mg Intravenous BID    irbesartan  150 mg Oral Daily   metoprolol tartrate  25 mg Oral TID   mometasone-formoterol  2 puff Inhalation BID   pantoprazole (PROTONIX) IV  40 mg Intravenous Q12H   spironolactone  25 mg Oral Daily   thiamine  100 mg Oral Daily   varenicline  1 mg Oral Daily   Infusions:   sodium chloride Stopped (06/15/22 0717)   sodium chloride     heparin 1,850 Units/hr (06/18/22 1400)   magnesium sulfate bolus IVPB     PRN: sodium chloride, sodium chloride, acetaminophen **OR** acetaminophen, albuterol, alum & mag hydroxide-simeth, bisacodyl, guaiFENesin-dextromethorphan, hydrALAZINE, labetalol, LORazepam, magnesium citrate, magnesium sulfate bolus IVPB, metoprolol tartrate, morphine injection, nitroGLYCERIN, ondansetron **OR** ondansetron (ZOFRAN) IV, mouth rinse, oxyCODONE, phenol, polyethylene glycol, potassium chloride, QUEtiapine  Assessment: Perle Gibbon is a 72 y.o. male with PMH significant for HFrEF secondary to ischemic cardiomyopathy, AF, throacic and abdominal aortic anuerysms, GIB  in 2021 prompting discontinuation of apixaban. Patient not on anticoagulation PTA. He presented to the ED with chest pain and SOB x 3 days. Heparin infusion was paused for endovascular AAA repair on 1/11. Pharmacy has been consulted to initiate and titrate heparin infusion while admitted.  Baseline labs: aPTT 31, INR 1.0, Plt 169, Hgb 13.6  Goal of Therapy:  Heparin level 0.3-0.7 units/ml Monitor platelets by anticoagulation protocol: Yes   Date Time HL Rate/Comment  1/10 1816  0.37 1850/therapeutic x1 1/11 0217 0.60 1850/therapeutic x2 1/11 1300 --- 0/hold for procedure 1/11 2317 --- 1850/resume per vascular  1/12 0709 0.31 1850/therapeutic x1 1/12 1536 0.28 1850/subtherapeutic  Plan: Bolus 2,100 units Increase heparin infusion to 2,000 units/hr Check HL in 8 hours to confirm Continue to monitor H&H and platelets daily while on heparin infusion    Glean Salvo, PharmD,  BCPS Clinical Pharmacist  06/18/2022 3:59 PM

## 2022-06-18 NOTE — Progress Notes (Signed)
Informed NP Eulogio Ditch about order to remove PAD at 0500, pt is on heparin infusion and right PAD is slightly oozing. Advised to start removing air at 0600 instead and leave the heparin running.

## 2022-06-19 DIAGNOSIS — I4891 Unspecified atrial fibrillation: Secondary | ICD-10-CM | POA: Diagnosis not present

## 2022-06-19 DIAGNOSIS — R109 Unspecified abdominal pain: Secondary | ICD-10-CM | POA: Diagnosis not present

## 2022-06-19 DIAGNOSIS — I5021 Acute systolic (congestive) heart failure: Secondary | ICD-10-CM

## 2022-06-19 DIAGNOSIS — I4811 Longstanding persistent atrial fibrillation: Secondary | ICD-10-CM | POA: Diagnosis not present

## 2022-06-19 LAB — COMPREHENSIVE METABOLIC PANEL
ALT: 95 U/L — ABNORMAL HIGH (ref 0–44)
AST: 53 U/L — ABNORMAL HIGH (ref 15–41)
Albumin: 3.3 g/dL — ABNORMAL LOW (ref 3.5–5.0)
Alkaline Phosphatase: 53 U/L (ref 38–126)
Anion gap: 8 (ref 5–15)
BUN: 10 mg/dL (ref 8–23)
CO2: 29 mmol/L (ref 22–32)
Calcium: 8 mg/dL — ABNORMAL LOW (ref 8.9–10.3)
Chloride: 94 mmol/L — ABNORMAL LOW (ref 98–111)
Creatinine, Ser: 0.7 mg/dL (ref 0.61–1.24)
GFR, Estimated: >60 mL/min (ref >60)
Glucose, Bld: 114 mg/dL — ABNORMAL HIGH (ref 70–99)
Potassium: 3.4 mmol/L — ABNORMAL LOW (ref 3.5–5.1)
Sodium: 131 mmol/L — ABNORMAL LOW (ref 135–145)
Total Bilirubin: 1.9 mg/dL — ABNORMAL HIGH (ref 0.3–1.2)
Total Protein: 6.5 g/dL (ref 6.5–8.1)

## 2022-06-19 LAB — CBC
HCT: 36.3 % — ABNORMAL LOW (ref 39.0–52.0)
Hemoglobin: 11.7 g/dL — ABNORMAL LOW (ref 13.0–17.0)
MCH: 29.3 pg (ref 26.0–34.0)
MCHC: 32.2 g/dL (ref 30.0–36.0)
MCV: 90.8 fL (ref 80.0–100.0)
Platelets: 142 10*3/uL — ABNORMAL LOW (ref 150–400)
RBC: 4 MIL/uL — ABNORMAL LOW (ref 4.22–5.81)
RDW: 14.6 % (ref 11.5–15.5)
WBC: 7.4 10*3/uL (ref 4.0–10.5)
nRBC: 0 % (ref 0.0–0.2)

## 2022-06-19 LAB — CULTURE, BLOOD (ROUTINE X 2)
Culture: NO GROWTH
Culture: NO GROWTH
Special Requests: ADEQUATE
Special Requests: ADEQUATE

## 2022-06-19 LAB — HEPARIN LEVEL (UNFRACTIONATED)
Heparin Unfractionated: 0.29 IU/mL — ABNORMAL LOW (ref 0.30–0.70)
Heparin Unfractionated: 0.35 IU/mL (ref 0.30–0.70)

## 2022-06-19 MED ORDER — METOPROLOL TARTRATE 25 MG PO TABS: 25.0000 mg | ORAL_TABLET | Freq: Two times a day (BID) | ORAL | Status: DC

## 2022-06-19 MED ORDER — APIXABAN 5 MG PO TABS
5.0000 mg | ORAL_TABLET | Freq: Two times a day (BID) | ORAL | Status: DC
  Administered 2022-06-19 – 2022-06-26 (×15): 5 mg via ORAL
  Filled 2022-06-19 (×15): qty 1

## 2022-06-19 MED ORDER — METOPROLOL SUCCINATE ER 25 MG PO TB24
25.0000 mg | ORAL_TABLET | Freq: Every day | ORAL | Status: DC
  Administered 2022-06-20: 25 mg via ORAL
  Filled 2022-06-19: qty 1

## 2022-06-19 MED ORDER — EMPAGLIFLOZIN 10 MG PO TABS
10.0000 mg | ORAL_TABLET | Freq: Every day | ORAL | Status: DC
  Administered 2022-06-19 – 2022-06-26 (×8): 10 mg via ORAL
  Filled 2022-06-19 (×8): qty 1

## 2022-06-19 MED ORDER — POTASSIUM CHLORIDE CRYS ER 20 MEQ PO TBCR
40.0000 meq | EXTENDED_RELEASE_TABLET | Freq: Once | ORAL | Status: AC
  Administered 2022-06-19: 40 meq via ORAL
  Filled 2022-06-19: qty 2

## 2022-06-19 NOTE — Plan of Care (Signed)
  Problem: Education: Goal: Knowledge of General Education information will improve Description: Including pain rating scale, medication(s)/side effects and non-pharmacologic comfort measures Outcome: Progressing   Problem: Health Behavior/Discharge Planning: Goal: Ability to manage health-related needs will improve Outcome: Progressing   Problem: Clinical Measurements: Goal: Respiratory complications will improve Outcome: Progressing   Problem: Clinical Measurements: Goal: Cardiovascular complication will be avoided Outcome: Progressing   Problem: Nutrition: Goal: Adequate nutrition will be maintained Outcome: Progressing   Problem: Elimination: Goal: Will not experience complications related to urinary retention Outcome: Progressing   Problem: Pain Managment: Goal: General experience of comfort will improve Outcome: Progressing   Problem: Skin Integrity: Goal: Demonstration of wound healing without infection will improve Outcome: Progressing   Problem: Safety: Goal: Ability to remain free from injury will improve Outcome: Progressing

## 2022-06-19 NOTE — Progress Notes (Signed)
Cardiology Progress Note  Patient ID: Eugene Henderson MRN: [id number] DOB: 12-12-1950 Date of Encounter: 06/19/2022  Primary Cardiologist: Lorine Bears, MD  Subjective   Chief Complaint: None.   HPI: There is volume up.  Breathing not back to baseline.  ROS:  All other ROS reviewed and negative. Pertinent positives noted in the HPI.     Inpatient Medications  Scheduled Meds:  apixaban  5 mg Oral BID   aspirin EC  81 mg Oral Q0600   Chlorhexidine Gluconate Cloth  6 each Topical Q0600   docusate sodium  100 mg Oral Daily   furosemide  40 mg Intravenous BID   [START ON 06/20/2022] metoprolol succinate  25 mg Oral Daily   mometasone-formoterol  2 puff Inhalation BID   pantoprazole (PROTONIX) IV  40 mg Intravenous Q12H   spironolactone  25 mg Oral Daily   thiamine  100 mg Oral Daily   varenicline  1 mg Oral Daily   Continuous Infusions:  sodium chloride Stopped (06/15/22 0717)   sodium chloride     magnesium sulfate bolus IVPB     PRN Meds: sodium chloride, sodium chloride, acetaminophen **OR** acetaminophen, albuterol, alum & mag hydroxide-simeth, bisacodyl, guaiFENesin-dextromethorphan, hydrALAZINE, labetalol, LORazepam, magnesium citrate, magnesium sulfate bolus IVPB, metoprolol tartrate, morphine injection, nitroGLYCERIN, ondansetron **OR** ondansetron (ZOFRAN) IV, mouth rinse, oxyCODONE, phenol, polyethylene glycol, QUEtiapine   Vital Signs   Vitals:   06/18/22 2057 06/18/22 2334 06/19/22 0400 06/19/22 0811  BP: 108/86 (!) 111/91 111/75 112/82  Pulse: 97 93 91 97  Resp:  18 18 13   Temp:  98.4 F (36.9 C) 99.2 F (37.3 C) 98.4 F (36.9 C)  TempSrc:  Oral Oral Oral  SpO2:  97% 100% 100%  Weight:      Height:        Intake/Output Summary (Last 24 hours) at 06/19/2022 1316 Last data filed at 06/19/2022 0636 Gross per 24 hour  Intake 592.76 ml  Output 1775 ml  Net -1182.24 ml      06/17/2022    6:15 PM 06/17/2022   10:51 AM 06/15/2022    2:38 AM  Last 3 Weights   Weight (lbs) 274 lb 0.5 oz 270 lb 274 lb 0.5 oz  Weight (kg) 124.3 kg 122.471 kg 124.3 kg      Telemetry  Overnight telemetry shows A-fib heart rate 90s, which I personally reviewed.   ECG  The most recent ECG shows A-fib heart rate 113, LVH by voltage, left anterior fascicular block, which I personally reviewed.   Physical Exam   Vitals:   06/18/22 2057 06/18/22 2334 06/19/22 0400 06/19/22 0811  BP: 108/86 (!) 111/91 111/75 112/82  Pulse: 97 93 91 97  Resp:  18 18 13   Temp:  98.4 F (36.9 C) 99.2 F (37.3 C) 98.4 F (36.9 C)  TempSrc:  Oral Oral Oral  SpO2:  97% 100% 100%  Weight:      Height:        Intake/Output Summary (Last 24 hours) at 06/19/2022 1316 Last data filed at 06/19/2022 0636 Gross per 24 hour  Intake 592.76 ml  Output 1775 ml  Net -1182.24 ml       06/17/2022    6:15 PM 06/17/2022   10:51 AM 06/15/2022    2:38 AM  Last 3 Weights  Weight (lbs) 274 lb 0.5 oz 270 lb 274 lb 0.5 oz  Weight (kg) 124.3 kg 122.471 kg 124.3 kg    Body mass index is 37.17 kg/m.  General: Well nourished,  well developed, in no acute distress Head: Atraumatic, normal size  Eyes: PEERLA, EOMI  Neck: Supple, JVD elevated 10 to 12 cm of water Endocrine: No thryomegaly Cardiac: Normal S1, S2; irregular rhythm, no murmurs Lungs: Clear to auscultation bilaterally, no wheezing, rhonchi or rales  Abd: Soft, nontender, no hepatomegaly  Ext: No edema, pulses 2+ Musculoskeletal: No deformities, BUE and BLE strength normal and equal Skin: Warm and dry, no rashes   Neuro: Alert and oriented to person, place, time, and situation, CNII-XII grossly intact, no focal deficits  Psych: Normal mood and affect   Labs  High Sensitivity Troponin:   Recent Labs  Lab 06/14/22 1224 06/14/22 1413  TROPONINIHS 25* 28*     Cardiac EnzymesNo results for input(s): "TROPONINI" in the last 168 hours. No results for input(s): "TROPIPOC" in the last 168 hours.  Chemistry Recent Labs  Lab  06/17/22 0217 06/17/22 1730 06/18/22 0459 06/19/22 0415  NA 130* 135 136 131*  K 3.7 3.5 4.1 3.4*  CL 97* 98 102 94*  CO2 23 27 26 29   GLUCOSE 102* 115* 146* 114*  BUN 33* 22 17 10   CREATININE 0.96 0.87 0.81 0.70  CALCIUM 8.2* 7.9* 8.0* 8.0*  PROT 7.1  --  6.7 6.5  ALBUMIN 3.8  --  3.6 3.3*  AST 197*  --  96* 53*  ALT 164*  --  137* 95*  ALKPHOS 69  --  61 53  BILITOT 1.7*  --  1.1 1.9*  GFRNONAA >60 >60 >60 >60  ANIONGAP 10 10 8 8     Hematology Recent Labs  Lab 06/17/22 1730 06/18/22 0459 06/19/22 0415  WBC 5.0 6.1 7.4  RBC 4.47 4.27 4.00*  HGB 12.8* 12.4* 11.7*  HCT 39.5 38.4* 36.3*  MCV 88.4 89.9 90.8  MCH 28.6 29.0 29.3  MCHC 32.4 32.3 32.2  RDW 14.6 14.7 14.6  PLT 135* 149* 142*   BNP Recent Labs  Lab 06/14/22 1321  BNP 575.5*    DDimer No results for input(s): "DDIMER" in the last 168 hours.   Radiology  PERIPHERAL VASCULAR CATHETERIZATION  Result Date: 06/17/2022 See surgical note for result.   Cardiac Studies  TTE 06/15/2022  1. Left ventricular ejection fraction, by estimation, is 20 to 25%. The  left ventricle has severely decreased function. The left ventricle  demonstrates global hypokinesis. The left ventricular internal cavity size  was severely dilated. There is mild  left ventricular hypertrophy. Left ventricular diastolic function could  not be evaluated.   2. Right ventricular systolic function is severely reduced. The right  ventricular size is mildly enlarged. There is moderately elevated  pulmonary artery systolic pressure.   3. Left atrial size was severely dilated.   4. Right atrial size was severely dilated.   5. The mitral valve is normal in structure. Moderate mitral valve  regurgitation.   6. Tricuspid valve regurgitation is mild to moderate.   7. The aortic valve is tricuspid. Aortic valve regurgitation is trivial.  No aortic stenosis is present.   8. Aortic dilatation noted. There is severe dilatation of the aortic   root, measuring 53 mm. There is severe dilatation of the ascending aorta,  measuring 50 mm.   9. The inferior vena cava is dilated in size with <50% respiratory  variability, suggesting right atrial pressure of 15 mmHg.   LHC 08/02/2016 1. No significant coronary artery disease. 2. Severely reduced LV systolic function by echo. Left ventricular angiography was not performed.  3. Right heart catheterization  showed minimally elevated filling pressures and pulmonary hypertension. Moderately reduced cardiac output. RA pressure: 12 mmHg, RV pressure: 40 over 6 mmHg, PA pressure 37/12 with a mean of 12 mmHg. Pulmonary capillary wedge pressure was 12. Cardiac output was 4.16 with a cardiac index of 1.77.  Patient Profile  Daschel Roughton is a 72 y.o. male with systolic heart failure, permanent atrial fibrillation, diabetes, tobacco abuse who was admitted on 06/15/2022 for acute on chronic systolic heart failure.  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  He is status post endovascular repair.  Assessment & Plan   # Acute on chronic systolic heart failure, EF 20-25% -Still appears volume up.  Will continue with Lasix 40 mg IV twice daily.  -Net -4.9 L.  Still with some volume.  Continue with IV diuresis. -Hold irbesartan.  Blood pressure is low. -Stop metoprolol to tartrate.  Transition to metoprolol succinate 25 mg tomorrow. -Continue Aldactone 25 mg daily. -Start Jardiance 10 mg daily. -Nonischemic cardiomyopathy.  Left heart cath in 2018 with no significant CAD. -Continue with diuresis and optimizing his medical regimen.  # Permanent A-fib -Vascular surgery okay to restart Eliquis.  Also will need to be on aspirin. -Rate control strategy has been pursued.  Continue with metoprolol succinate 25 mg daily.  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  On aspirin per vascular surgery.  Restarting Eliquis today. -He will need monitoring of his thoracic aorta as an outpatient.    For questions or updates, please contact [place] Please consult [email or web address] for contact info under   Signed, [place] T. Audie Box, MD, [place]  06/19/2022 1:16 PM

## 2022-06-19 NOTE — Progress Notes (Signed)
    Subjective  - POD #2  Has not been very mobile C/o left hip pain   Physical Exam:  Both groin sites without hematoma Abd soft and non-tender Palpable pedal pulses       Assessment/Plan:  POD #2  Needs to get OOB with PT.   Eliquis and ASA Resume statin therapy   [place] Karimah Winquist 06/19/2022 2:22 PM --  Vitals:   06/19/22 0400 06/19/22 0811  BP: 111/75 112/82  Pulse: 91 97  Resp: 18 13  Temp: 99.2 F (37.3 C) 98.4 F (36.9 C)  SpO2: 100% 100%    Intake/Output Summary (Last 24 hours) at 06/19/2022 1422 Last data filed at 06/19/2022 1408 Gross per 24 hour  Intake 795.82 ml  Output 2625 ml  Net -1829.18 ml     Laboratory CBC    Component Value Date/Time   WBC 7.4 06/19/2022 0415   HGB 11.7 (L) 06/19/2022 0415   HGB 14.3 06/18/2021 1409   HCT 36.3 (L) 06/19/2022 0415   HCT 42.1 06/18/2021 1409   PLT 142 (L) 06/19/2022 0415   PLT 154 06/18/2021 1409    BMET    Component Value Date/Time   NA 131 (L) 06/19/2022 0415   NA 139 06/18/2021 1409   K 3.4 (L) 06/19/2022 0415   CL 94 (L) 06/19/2022 0415   CO2 29 06/19/2022 0415   GLUCOSE 114 (H) 06/19/2022 0415   BUN 10 06/19/2022 0415   BUN 13 06/18/2021 1409   CREATININE 0.70 06/19/2022 0415   CALCIUM 8.0 (L) 06/19/2022 0415   GFRNONAA >60 06/19/2022 0415   GFRAA 96 03/28/2020 1524    COAG Lab Results  Component Value Date   INR 1.3 (H) 06/17/2022   INR 1.0 06/14/2022   INR 1.2 08/25/2019   No results found for: "PTT"  Antibiotics Anti-infectives (From admission, onward)    Start     Dose/Rate Route Frequency Ordered Stop   06/17/22 1830  ceFAZolin (ANCEF) IVPB 2g/100 mL premix        2 g 200 mL/hr over 30 Minutes Intravenous Every 8 hours 06/17/22 1829 06/18/22 0638   06/17/22 0957  ceFAZolin (ANCEF) 2-4 GM/100ML-% IVPB  Status:  Discontinued       Note to Pharmacy: Genelle Bal J: cabinet override      06/17/22 0957 06/17/22 1607   06/17/22 0945  ceFAZolin (ANCEF) IVPB 2g/100 mL  premix  Status:  Discontinued        2 g 200 mL/hr over 30 Minutes Intravenous 30 min pre-op 06/17/22 0945 06/17/22 1217   06/14/22 1845  metroNIDAZOLE (FLAGYL) IVPB 500 mg  Status:  Discontinued        500 mg 100 mL/hr over 60 Minutes Intravenous Every 12 hours 06/14/22 1836 06/16/22 0718   06/14/22 1845  cefTRIAXone (ROCEPHIN) 2 g in sodium chloride 0.9 % 100 mL IVPB  Status:  Discontinued        2 g 200 mL/hr over 30 Minutes Intravenous Every 24 hours 06/14/22 1836 06/16/22 0718        V. Leia Alf, M.D., [facility] [place] Office: [phone] Pager:  [phone]

## 2022-06-19 NOTE — Consult Note (Signed)
ANTICOAGULATION CONSULT NOTE  Pharmacy Consult for Heparin Infusion Indication: atrial fibrillation  No Known Allergies  Patient Measurements: Height: 6' (182.9 cm) Weight: 124.3 kg (274 lb 0.5 oz) IBW/kg (Calculated) : 77.6 Heparin Dosing Weight: 105.2 kg  Vital Signs: Temp: 98.4 F (36.9 C) (01/12 2334) Temp Source: Oral (01/12 2334) BP: 111/91 (01/12 2334) Pulse Rate: 93 (01/12 2334)  Labs: Recent Labs    06/17/22 0217 06/17/22 1730 06/18/22 0459 06/18/22 0709 06/18/22 1536 06/19/22 0048  HGB 13.1 12.8* 12.4*  --   --   --   HCT 39.3 39.5 38.4*  --   --   --   PLT 142* 135* 149*  --   --   --   APTT  --  181*  --   --   --   --   LABPROT  --  16.2*  --   --   --   --   INR  --  1.3*  --   --   --   --   HEPARINUNFRC 0.60  --   --  0.31 0.28* 0.29*  CREATININE 0.96 0.87 0.81  --   --   --      Estimated Creatinine Clearance: 113.9 mL/min (by C-G formula based on SCr of 0.81 mg/dL).   Medical History: Past Medical History:  Diagnosis Date   Arthritis    Ascending aortic aneurysm ([place])    a. 09/2017 Stable TAA - 5.1cm.   Asthma    Chronic systolic CHF (congestive heart failure) ([place])    a. EF 25-30% by echo in 07/2016 with cath showing no significant CAD b. 01/2017: EF 30-35% with diffuse HK and moderate MR; c. 07/2017 Echo: EF 30-35%, diff hK. Mild MR, mildly dil LA. PASP 63mmHg.   Diverticulitis    Diverticulitis of large intestine with perforation without abscess or bleeding 05/13/2017   Hypertension    Hyperthyroidism    NICM (nonischemic cardiomyopathy) ([place])    Noncompliance    Persistent atrial fibrillation (HCC)    a. CHA2DS2VASc = 3-->Eliquis (? compliance).    Medications:  Scheduled:   aspirin EC  81 mg Oral Q0600   Chlorhexidine Gluconate Cloth  6 each Topical Q0600   docusate sodium  100 mg Oral Daily   furosemide  40 mg Intravenous BID   irbesartan  150 mg Oral Daily   metoprolol tartrate  25 mg Oral TID   mometasone-formoterol  2 puff  Inhalation BID   pantoprazole (PROTONIX) IV  40 mg Intravenous Q12H   spironolactone  25 mg Oral Daily   thiamine  100 mg Oral Daily   varenicline  1 mg Oral Daily   Infusions:   sodium chloride Stopped (06/15/22 0717)   sodium chloride     heparin 2,000 Units/hr (06/18/22 2157)   magnesium sulfate bolus IVPB     PRN: sodium chloride, sodium chloride, acetaminophen **OR** acetaminophen, albuterol, alum & mag hydroxide-simeth, bisacodyl, guaiFENesin-dextromethorphan, hydrALAZINE, labetalol, LORazepam, magnesium citrate, magnesium sulfate bolus IVPB, metoprolol tartrate, morphine injection, nitroGLYCERIN, ondansetron **OR** ondansetron (ZOFRAN) IV, mouth rinse, oxyCODONE, phenol, polyethylene glycol, potassium chloride, QUEtiapine  Assessment: Eugene Henderson is a 72 y.o. male with PMH significant for HFrEF secondary to ischemic cardiomyopathy, AF, throacic and abdominal aortic anuerysms, GIB in 2021 prompting discontinuation of apixaban. Patient not on anticoagulation PTA. He presented to the ED with chest pain and SOB x 3 days. Heparin infusion was paused for endovascular AAA repair on 1/11. Pharmacy has been consulted to initiate and titrate  heparin infusion while admitted.  Baseline labs: aPTT 31, INR 1.0, Plt 169, Hgb 13.6  Goal of Therapy:  Heparin level 0.3-0.7 units/ml Monitor platelets by anticoagulation protocol: Yes   Date Time HL Rate/Comment  1/10 1816  0.37 1850/therapeutic x1 1/11 0217 0.60 1850/therapeutic x2 1/11 1300 --- 0/hold for procedure 1/11 2317 --- 1850/resume per vascular  1/12 0709 0.31 1850/therapeutic x1 1/12 1536 0.28 1850/subtherapeutic 1/13 0048 0.29 Slightly subtherapeutic  Plan: Increase heparin infusion to 2,200 units/hr Check HL in 8 hours after rate change Continue to monitor H&H and platelets daily while on heparin infusion   Renda Rolls, PharmD, [facility] 06/19/2022 1:52 AM

## 2022-06-19 NOTE — Consult Note (Signed)
[place] for Apixaban Indication: atrial fibrillation  No Known Allergies  Patient Measurements: Height: 6' (182.9 cm) Weight: 124.3 kg (274 lb 0.5 oz) IBW/kg (Calculated) : 77.6 Heparin Dosing Weight: 105.2 kg  Vital Signs: Temp: 98.4 F (36.9 C) (01/13 0811) Temp Source: Oral (01/13 0811) BP: 112/82 (01/13 0811) Pulse Rate: 97 (01/13 0811)  Labs: Recent Labs    06/17/22 1730 06/18/22 0459 06/18/22 0709 06/18/22 1536 06/19/22 0048 06/19/22 0415 06/19/22 0936  HGB 12.8* 12.4*  --   --   --  11.7*  --   HCT 39.5 38.4*  --   --   --  36.3*  --   PLT 135* 149*  --   --   --  142*  --   APTT 181*  --   --   --   --   --   --   LABPROT 16.2*  --   --   --   --   --   --   INR 1.3*  --   --   --   --   --   --   HEPARINUNFRC  --   --    < > 0.28* 0.29*  --  0.35  CREATININE 0.87 0.81  --   --   --  0.70  --    < > = values in this interval not displayed.     Estimated Creatinine Clearance: 115.4 mL/min (by C-G formula based on SCr of 0.7 mg/dL).   Medical History: Past Medical History:  Diagnosis Date   Arthritis    Ascending aortic aneurysm ([place])    a. 09/2017 Stable TAA - 5.1cm.   Asthma    Chronic systolic CHF (congestive heart failure) ([place])    a. EF 25-30% by echo in 07/2016 with cath showing no significant CAD b. 01/2017: EF 30-35% with diffuse HK and moderate MR; c. 07/2017 Echo: EF 30-35%, diff hK. Mild MR, mildly dil LA. PASP 51mmHg.   Diverticulitis    Diverticulitis of large intestine with perforation without abscess or bleeding 05/13/2017   Hypertension    Hyperthyroidism    NICM (nonischemic cardiomyopathy) ([place])    Noncompliance    Persistent atrial fibrillation (HCC)    a. CHA2DS2VASc = 3-->Eliquis (? compliance).    Medications:  Scheduled:   aspirin EC  81 mg Oral Q0600   Chlorhexidine Gluconate Cloth  6 each Topical Q0600   docusate sodium  100 mg Oral Daily   furosemide  40 mg Intravenous BID    irbesartan  150 mg Oral Daily   metoprolol tartrate  25 mg Oral TID   mometasone-formoterol  2 puff Inhalation BID   pantoprazole (PROTONIX) IV  40 mg Intravenous Q12H   spironolactone  25 mg Oral Daily   thiamine  100 mg Oral Daily   varenicline  1 mg Oral Daily   Infusions:   sodium chloride Stopped (06/15/22 0717)   sodium chloride     heparin 2,200 Units/hr (06/19/22 0159)   magnesium sulfate bolus IVPB     PRN: sodium chloride, sodium chloride, acetaminophen **OR** acetaminophen, albuterol, alum & mag hydroxide-simeth, bisacodyl, guaiFENesin-dextromethorphan, hydrALAZINE, labetalol, LORazepam, magnesium citrate, magnesium sulfate bolus IVPB, metoprolol tartrate, morphine injection, nitroGLYCERIN, ondansetron **OR** ondansetron (ZOFRAN) IV, mouth rinse, oxyCODONE, phenol, polyethylene glycol, QUEtiapine  Assessment: Eugene Henderson is a 72 y.o. male with PMH significant for HFrEF secondary to ischemic cardiomyopathy, AF, throacic and abdominal aortic anuerysms, GIB in 2021 prompting discontinuation of  apixaban. Patient not on anticoagulation PTA. He presented to the ED with chest pain and SOB x 3 days. Heparin infusion was paused for endovascular AAA repair on 1/11. Pharmacy has been consulted to initiate and titrate heparin infusion while admitted.  Baseline labs: aPTT 31, INR 1.0, Plt 169, Hgb 13.6  Goal of Therapy:  Heparin level 0.3-0.7 units/ml Monitor platelets by anticoagulation protocol: Yes  Plan: Discontinue heparin infusion  Start apixaban 5 mg PO BID  Pharmacy will sign off and continue to monitor peripherally  [name], PharmD PGY1 Pharmacy Resident 06/19/2022 10:59 AM

## 2022-06-19 NOTE — Progress Notes (Signed)
PROGRESS NOTE   Eugene Henderson  [id number]    DOB: [birth year]    DOA: 06/14/2022  PCP: Valerie Roys, DO   I have briefly reviewed patients previous medical records in [facility].  Chief Complaint  Patient presents with   Chest Pain   Shortness of Breath    Brief Narrative:  72 year old male with PMH of HTN, HLD, type II DM, tobacco abuse, presented to the ED on 06/14/2022 with chest pain and dyspnea x 3 days.  He was initially admitted with diagnosis of abdominal pain.  No choledocholithiasis by imaging and GI signed off.  Cardiology consulted for perioperative CV risk assessment in anticipation of AAA and iliac aneurysm repairs, acute on chronic systolic CHF and A-fib.  Hospital course complicated by delirium.  He then underwent endovascular procedures for his AAA and iliac artery aneurysm on 1/11, postprocedure was admitted to ICU.  Slowly improving.  Transfer orders to telemetry bed on 1/12.   Assessment & Plan:  Principal Problem:   Abdominal pain Active Problems:   Persistent atrial fibrillation (HCC)   Essential hypertension   Tobacco use   Chest pain   Diabetes mellitus without complication (HCC)   Morbid obesity (HCC)   AF (paroxysmal atrial fibrillation) (HCC)   Hyperlipidemia   Metal foreign body in head   Elevated bilirubin   Atrial fibrillation with RVR (HCC)   Thoracoabdominal aortic aneurysm (TAAA) (HCC)   HFrEF (heart failure with reduced ejection fraction) ([place])   NICM (nonischemic cardiomyopathy) ([place])   Pre-operative cardiovascular examination   Abdominal pain: This was his admitting presentation.  Negative workup for choledocholithiasis..  GI recommended outpatient follow-up for cirrhosis, twice daily Protonix x 8 to 12 weeks, avoiding NSAIDs, no EGD was recommended and GI signed off.  Overall etiology unclear but wide DD: Gastritis, PUD, passive hepatic congestion from decompensated CHF, AAA etc. abdominal pain has resolved  Acute respiratory  distress Present on admission.  Suspected due to decompensated CHF.  Could have underlying OSA/OHS based on body habitus.  This will have to be evaluated as outpatient.  Incentive spirometry.  Upon careful review of chart since admission, no clear hypoxia documented despite patient having dyspnea and tachypnea.  Thereby acute respiratory failure with hypoxia ruled out.  Was not on [place] oxygen this morning, suspect he has been weaned off to room air.  Acute on chronic systolic CHF/nonischemic cardiomyopathy: 2D echo 2022: LVEF 30%.  Repeat echo this admission: LVEF 20-25%.  Prior left heart cath with no CAD.  Remains on IV Lasix 40 mg twice daily.  -3.5 L thus far.  Neither intake output nor weight seems to be accurate.  Clinically appears mildly volume overloaded.  Cardiology following.  Hypokalemia: Replace and follow.  Persistent A-fib: Was on IV heparin post endovascular procedures.  Rate is controlled.  Was not on anticoagulation PTA.  Continue metoprolol 25 Mg 3 times daily, to be consolidated to Toprol-XL prior to discharge.  Cardiology following.  Vascular surgery has cleared for transition to Eliquis and aspirin, discussed with Cardiology/[place]'Neal who agrees.  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 Abdomen CTA revealed several abnormalities 1/8 (see for detailed report).  Following Cardiology preop clearance, patient underwent endovascular treatment of AAA and iliac artery aneurysms by vascular surgery on 1/11.  As per discussion with vascular surgery team this morning, okay to transfer out of ICU.  Patient will need follow up with CT  surgery after discharge for thoracic aortic aneurysm.    Delirium event overnight 1/9.  As per ICU RN, no recurrence of delirium last night and did not have to give Ativan for Seroquel.   Essential hypertension Reasonably controlled.  Continue irbesartan 150 Mg daily,  metoprolol tartrate 25 Mg 3 times daily, spironolactone 25 Mg daily.     Metal foreign body in head non acute issue.  Outpatient follow-up.   Hyperlipidemia Continued statin.  Thrombocytopenia: May be related to heparin drip.  Follow daily CBCs.  Stable.  Mild transaminitis: Unclear etiology.  Stable over the last 3 days.  Slightly better.  Continue to monitor daily CMP.   Body mass index is 37.17 kg/m./Morbid obesity This meets criteria for morbid obesity based on the presence of 1 or more chronic comorbidities. Patient has hypertension. This complicates overall care and prognosis.    DVT prophylaxis: Place TED hose Start: 06/14/22 1835.  Currently on IV heparin, switching to Eliquis and.   Code Status: Full Code:  Family Communication: None at bedside Disposition:  Status is: Inpatient Remains inpatient appropriate because: Just had endovascular procedure, remains on IV heparin which needs to be transition to Eliquis.  Hopeful discharge in the next 1 to 2 days.  PT eval.     Consultants:   Gastroenterology Cardiology Vascular surgery  Procedures:   As noted above  Antimicrobials:      Subjective:  Reports that he slept well last night and "did not cause any trouble".  Denies dyspnea.  Mild groin discomfort bilaterally.  Feels weak overall.  Objective:   Vitals:   06/18/22 2057 06/18/22 2334 06/19/22 0400 06/19/22 0811  BP: 108/86 (!) 111/91 111/75 112/82  Pulse: 97 93 91 97  Resp:  18 18 13   Temp:  98.4 F (36.9 C) 99.2 F (37.3 C) 98.4 F (36.9 C)  TempSrc:  Oral Oral Oral  SpO2:  97% 100% 100%  Weight:      Height:        General exam: Elderly male, moderately built and obese lying comfortably propped up in bed without distress.  Appears to be in good spirits. Respiratory system: Clear to auscultation.  No increased work of breathing.  Was on room air this morning. Cardiovascular system: S1 & S2 heard, RRR. No JVD, murmurs, rubs, gallops or clicks.  No pedal edema.  Telemetry personally reviewed: A-fib with ventricular rate in the 90s-100s. Gastrointestinal system: Abdomen is nondistended, soft and nontender. No organomegaly or masses felt. Normal bowel sounds heard.  Bilateral groin endovascular interventions sites reexamined today and without acute findings. GU: Foley catheter has been discontinued. Central nervous system: Alert and oriented. No focal neurological deficits. Extremities: Symmetric 5 x 5 power. Skin: No rashes, lesions or ulcers Psychiatry: Judgement and insight appear somewhat impaired. Mood & affect appropriate.     Data Reviewed:   I have personally reviewed following labs and imaging studies   CBC: Recent Labs  Lab 06/17/22 1730 06/18/22 0459 06/19/22 0415  WBC 5.0 6.1 7.4  HGB 12.8* 12.4* 11.7*  HCT 39.5 38.4* 36.3*  MCV 88.4 89.9 90.8  PLT 135* 149* 142*     Basic Metabolic Panel: Recent Labs  Lab 06/14/22 1835 06/15/22 0304 06/16/22 0447 06/17/22 0217 06/17/22 1730 06/18/22 0459 06/19/22 0415  NA  --    < > 135 130* 135 136 131*  K  --    < > 4.0 3.7 3.5 4.1 3.4*  CL  --    < > 98  97* 98 102 94*  CO2  --    < > 26 23 27 26 29   GLUCOSE  --    < > 88 102* 115* 146* 114*  BUN  --    < > 34* 33* 22 17 10   CREATININE  --    < > 1.17 0.96 0.87 0.81 0.70  CALCIUM  --    < > 8.7* 8.2* 7.9* 8.0* 8.0*  MG 2.0  --   --   --   --   --   --   PHOS 4.2  --   --   --   --   --   --    < > = values in this interval not displayed.     Liver Function Tests: Recent Labs  Lab 06/15/22 0304 06/16/22 0447 06/17/22 0217 06/18/22 0459 06/19/22 0415  AST 39 122* 197* 96* 53*  ALT 31 88* 164* 137* 95*  ALKPHOS 68 70 69 61 53  BILITOT 1.8* 1.4* 1.7* 1.1 1.9*  PROT 8.0 7.6 7.1 6.7 6.5  ALBUMIN 4.3 4.1 3.8 3.6 3.3*     CBG: Recent Labs  Lab 06/17/22 2316 06/18/22 0307 06/18/22 0714  GLUCAP 149* 136* 159*     Microbiology Studies:   Recent Results (from the past 240 hour(s))  Resp panel  by RT-PCR (RSV, Flu A&B, Covid) Anterior Nasal Swab     Status: None   Collection Time: 06/14/22  2:16 PM   Specimen: Anterior Nasal Swab  Result Value Ref Range Status   SARS Coronavirus 2 by RT PCR NEGATIVE NEGATIVE Final    Comment: (NOTE) SARS-CoV-2 target nucleic acids are NOT DETECTED.  The SARS-CoV-2 RNA is generally detectable in upper respiratory specimens during the acute phase of infection. The lowest concentration of SARS-CoV-2 viral copies this assay can detect is 138 copies/mL. A negative result does not preclude SARS-Cov-2 infection and should not be used as the sole basis for treatment or other patient management decisions. A negative result may occur with  improper specimen collection/handling, submission of specimen other than nasopharyngeal swab, presence of viral mutation(s) within the areas targeted by this assay, and inadequate number of viral copies(<138 copies/mL). A negative result must be combined with clinical observations, patient history, and epidemiological information. The expected result is Negative.  Fact Sheet for Patients:  EntrepreneurPulse.com.au  Fact Sheet for Healthcare Providers:  IncredibleEmployment.be  This test is no t yet approved or cleared by the [place] FDA and  has been authorized for detection and/or diagnosis of SARS-CoV-2 by FDA under an Emergency Use Authorization (EUA). This EUA will remain  in effect (meaning this test can be used) for the duration of the COVID-19 declaration under Section 564(b)(1) of the Act, 21 U.S.C.section 360bbb-3(b)(1), unless the authorization is terminated  or revoked sooner.       Influenza A by PCR NEGATIVE NEGATIVE Final   Influenza B by PCR NEGATIVE NEGATIVE Final    Comment: (NOTE) The Xpert Xpress SARS-CoV-2/FLU/RSV plus assay is intended as an aid in the diagnosis of influenza from Nasopharyngeal swab specimens and should not be used as a sole  basis for treatment. Nasal washings and aspirates are unacceptable for Xpert Xpress SARS-CoV-2/FLU/RSV testing.  Fact Sheet for Patients: EntrepreneurPulse.com.au  Fact Sheet for Healthcare Providers: IncredibleEmployment.be  This test is not yet approved or cleared by the [place] FDA and has been authorized for detection and/or diagnosis of SARS-CoV-2 by FDA under an Emergency Use Authorization (EUA). This EUA  will remain in effect (meaning this test can be used) for the duration of the COVID-19 declaration under Section 564(b)(1) of the Act, 21 U.S.C. section 360bbb-3(b)(1), unless the authorization is terminated or revoked.     Resp Syncytial Virus by PCR NEGATIVE NEGATIVE Final    Comment: (NOTE) Fact Sheet for Patients: BloggerCourse.com  Fact Sheet for Healthcare Providers: SeriousBroker.it  This test is not yet approved or cleared by the [place] FDA and has been authorized for detection and/or diagnosis of SARS-CoV-2 by FDA under an Emergency Use Authorization (EUA). This EUA will remain in effect (meaning this test can be used) for the duration of the COVID-19 declaration under Section 564(b)(1) of the Act, 21 U.S.C. section 360bbb-3(b)(1), unless the authorization is terminated or revoked.  Performed at [facility], [address] [place], [place] [address]   Culture, blood (Routine X 2) w Reflex to ID Panel     Status: None   Collection Time: 06/14/22  6:56 PM   Specimen: BLOOD RIGHT ARM  Result Value Ref Range Status   Specimen Description BLOOD RIGHT ARM  Final   Special Requests   Final    BOTTLES DRAWN AEROBIC AND ANAEROBIC Blood Culture adequate volume   Culture   Final    NO GROWTH 5 DAYS Performed at [facility], [address] [place], [place] [address]    Report Status 06/19/2022 FINAL  Final  Culture, blood (Routine X 2) w Reflex  to ID Panel     Status: None   Collection Time: 06/14/22  6:56 PM   Specimen: BLOOD  Result Value Ref Range Status   Specimen Description BLOOD RIGHT HAND  Final   Special Requests   Final    BOTTLES DRAWN AEROBIC AND ANAEROBIC Blood Culture adequate volume   Culture   Final    NO GROWTH 5 DAYS Performed at [facility], [address], [place], [place] [address]    Report Status 06/19/2022 FINAL  Final  MRSA Next Gen by PCR, Nasal     Status: None   Collection Time: 06/17/22  6:21 PM   Specimen: Nasal Mucosa; Nasal Swab  Result Value Ref Range Status   MRSA by PCR Next Gen NOT DETECTED NOT DETECTED Final    Comment: (NOTE) The GeneXpert MRSA Assay (FDA approved for NASAL specimens only), is one component of a comprehensive MRSA colonization surveillance program. It is not intended to diagnose MRSA infection nor to guide or monitor treatment for MRSA infections. Test performance is not FDA approved in patients less than 72 years old. Performed at [facility], [address] [place], [place] [address]     Radiology Studies:  PERIPHERAL VASCULAR CATHETERIZATION  Result Date: 06/17/2022 See surgical note for result.   Scheduled Meds:    aspirin EC  81 mg Oral Q0600   Chlorhexidine Gluconate Cloth  6 each Topical Q0600   docusate sodium  100 mg Oral Daily   furosemide  40 mg Intravenous BID   irbesartan  150 mg Oral Daily   metoprolol tartrate  25 mg Oral TID   mometasone-formoterol  2 puff Inhalation BID   pantoprazole (PROTONIX) IV  40 mg Intravenous Q12H   spironolactone  25 mg Oral Daily   thiamine  100 mg Oral Daily   varenicline  1 mg Oral Daily    Continuous Infusions:    sodium chloride Stopped (06/15/22 0717)   sodium chloride     heparin 2,200 Units/hr (06/19/22 0159)  magnesium sulfate bolus IVPB       LOS: 5 days     Vernell Leep, MD,  FACP, [facility], [facility], [facility], [facility]     To contact the attending provider between 7A-7P or the covering provider during after hours 7P-7A, please log into the web site [email or web address] and access using universal [place] password for that web site. If you do not have the password, please call the hospital operator.  06/19/2022, 10:01 AM

## 2022-06-20 DIAGNOSIS — I5021 Acute systolic (congestive) heart failure: Secondary | ICD-10-CM | POA: Diagnosis not present

## 2022-06-20 DIAGNOSIS — I4821 Permanent atrial fibrillation: Secondary | ICD-10-CM

## 2022-06-20 DIAGNOSIS — E876 Hypokalemia: Secondary | ICD-10-CM | POA: Diagnosis not present

## 2022-06-20 LAB — COMPREHENSIVE METABOLIC PANEL
ALT: 68 U/L — ABNORMAL HIGH (ref 0–44)
AST: 32 U/L (ref 15–41)
Albumin: 3.2 g/dL — ABNORMAL LOW (ref 3.5–5.0)
Alkaline Phosphatase: 52 U/L (ref 38–126)
Anion gap: 9 (ref 5–15)
BUN: 10 mg/dL (ref 8–23)
CO2: 31 mmol/L (ref 22–32)
Calcium: 8.2 mg/dL — ABNORMAL LOW (ref 8.9–10.3)
Chloride: 93 mmol/L — ABNORMAL LOW (ref 98–111)
Creatinine, Ser: 0.72 mg/dL (ref 0.61–1.24)
GFR, Estimated: >60 mL/min (ref >60)
Glucose, Bld: 88 mg/dL (ref 70–99)
Potassium: 3.3 mmol/L — ABNORMAL LOW (ref 3.5–5.1)
Sodium: 133 mmol/L — ABNORMAL LOW (ref 135–145)
Total Bilirubin: 1.7 mg/dL — ABNORMAL HIGH (ref 0.3–1.2)
Total Protein: 6.5 g/dL (ref 6.5–8.1)

## 2022-06-20 LAB — CBC
HCT: 36.9 % — ABNORMAL LOW (ref 39.0–52.0)
Hemoglobin: 12 g/dL — ABNORMAL LOW (ref 13.0–17.0)
MCH: 29.3 pg (ref 26.0–34.0)
MCHC: 32.5 g/dL (ref 30.0–36.0)
MCV: 90 fL (ref 80.0–100.0)
Platelets: 137 10*3/uL — ABNORMAL LOW (ref 150–400)
RBC: 4.1 MIL/uL — ABNORMAL LOW (ref 4.22–5.81)
RDW: 14.6 % (ref 11.5–15.5)
WBC: 6.6 10*3/uL (ref 4.0–10.5)
nRBC: 0 % (ref 0.0–0.2)

## 2022-06-20 LAB — MAGNESIUM: Magnesium: 1.9 mg/dL (ref 1.7–2.4)

## 2022-06-20 MED ORDER — METOPROLOL SUCCINATE ER 25 MG PO TB24
25.0000 mg | ORAL_TABLET | Freq: Once | ORAL | Status: AC
  Administered 2022-06-20: 25 mg via ORAL
  Filled 2022-06-20: qty 1

## 2022-06-20 MED ORDER — FUROSEMIDE 20 MG PO TABS: 20.0000 mg | ORAL_TABLET | Freq: Every day | ORAL | Status: DC

## 2022-06-20 MED ORDER — FUROSEMIDE 40 MG PO TABS
40.0000 mg | ORAL_TABLET | Freq: Every day | ORAL | Status: DC
  Administered 2022-06-20 – 2022-06-26 (×7): 40 mg via ORAL
  Filled 2022-06-20 (×7): qty 1

## 2022-06-20 MED ORDER — METOPROLOL SUCCINATE ER 50 MG PO TB24
50.0000 mg | ORAL_TABLET | Freq: Every day | ORAL | Status: DC
  Administered 2022-06-21 – 2022-06-26 (×6): 50 mg via ORAL
  Filled 2022-06-20 (×6): qty 1

## 2022-06-20 MED ORDER — POTASSIUM CHLORIDE 20 MEQ PO PACK
40.0000 meq | PACK | Freq: Once | ORAL | Status: AC
  Administered 2022-06-20: 40 meq via ORAL
  Filled 2022-06-20: qty 2

## 2022-06-20 MED ORDER — POTASSIUM CHLORIDE CRYS ER 20 MEQ PO TBCR
40.0000 meq | EXTENDED_RELEASE_TABLET | Freq: Two times a day (BID) | ORAL | Status: DC
  Administered 2022-06-20: 40 meq via ORAL
  Filled 2022-06-20: qty 2

## 2022-06-20 MED ORDER — SACUBITRIL-VALSARTAN 24-26 MG PO TABS
1.0000 | ORAL_TABLET | Freq: Two times a day (BID) | ORAL | Status: DC
  Administered 2022-06-20 – 2022-06-26 (×13): 1 via ORAL
  Filled 2022-06-20 (×13): qty 1

## 2022-06-20 MED ORDER — ATORVASTATIN CALCIUM 20 MG PO TABS
40.0000 mg | ORAL_TABLET | Freq: Every day | ORAL | Status: DC
  Administered 2022-06-20 – 2022-06-26 (×7): 40 mg via ORAL
  Filled 2022-06-20 (×7): qty 2

## 2022-06-20 NOTE — Evaluation (Signed)
Physical Therapy Evaluation Patient Details Name: Eugene Henderson MRN: [id number] DOB: 09-18-1950 Today's Date: 06/20/2022  History of Present Illness  Eugene Henderson is a 72yoM who comes to [facility] on 06/14/22 with CP and SOB. 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 In ED HR in 120-150s in RVR. BNP 575. Workup correlates pain symptoms with suspected cholecystitis. 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  Clinical Impression  Pt having typical back pain, acute pain in Left groin near femoral access and left shoulder (insidious). Pt agreeable to evaluation, reports he has been up to chair today, also reports being able to use RW in room which he finds far more helpful that his baseline SPC. PT able to perform all mobility safely and with modified independence, AMB out into hallway and back to room, but is not ambitious in his distance this date given his continued pain. Pt agreeable to start AMB more in room and OOB to chair for meals. Will get him a RW for DC.      Recommendations for follow up therapy are one component of a multi-disciplinary discharge planning process, led by the attending physician.  Recommendations may be updated based on patient status, additional functional criteria and insurance authorization.  Follow Up Recommendations No PT follow up      Assistance Recommended at Discharge Set up Supervision/Assistance  Patient can return home with the following       Equipment Recommendations Rolling walker (2 wheels)  Recommendations for Other Services       Functional Status Assessment Patient has had a recent decline in their functional status and demonstrates the ability to make significant improvements in function in a reasonable and predictable amount of time.     Precautions / Restrictions Precautions Precautions: Fall Restrictions Weight Bearing Restrictions: No      Mobility   Bed Mobility Overal bed mobility: Independent                  Transfers Overall transfer level: Independent Equipment used: Rolling walker (2 wheels), None                    Ambulation/Gait Ambulation/Gait assistance: Modified independent (Device/Increase time) Gait Distance (Feet): 70 Feet Assistive device: Rolling walker (2 wheels) Gait Pattern/deviations: WFL(Within Functional Limits)       General Gait Details: moving fairly well without much issue, but self limits due to pain/discomfort  Stairs            Wheelchair Mobility    Modified Rankin (Stroke Patients Only)       Balance Overall balance assessment: Modified Independent, No apparent balance deficits (not formally assessed)                                           Pertinent Vitals/Pain Pain Assessment Pain Assessment: 0-10 Pain Score: 7  (7/10 low back pain, 6/10 Left groin, also has acute LUE pain)    Home Living Family/patient expects to be discharged to:: Private residence Living Arrangements: Children (DTR, DIL, 15yo grad-DTR (both work during day)) Available Help at Discharge: Family Type of Home: House Home Access: Stairs to enter   Technical brewer of Steps: 1   Home Layout: Able to live on main level with bedroom/bathroom;Two level Home Equipment: Cane - single  point Additional Comments: has been using RW while admitted and feels more mobile    Prior Function Prior Level of Function : Independent/Modified Independent               ADLs Comments: takes bird baths at home; uses [facility] fopr AMB PTA;     Hand Dominance        Extremity/Trunk Assessment                Communication      Cognition Arousal/Alertness: Awake/alert Behavior During Therapy: WFL for tasks assessed/performed Overall Cognitive Status: Within Functional Limits for tasks assessed                                          General Comments       Exercises     Assessment/Plan    PT Assessment Patient needs continued PT services  PT Problem List Decreased activity tolerance;Decreased mobility       PT Treatment Interventions DME instruction;Gait training;Functional mobility training;Stair training;Therapeutic activities;Therapeutic exercise;Balance training;Neuromuscular re-education    PT Goals (Current goals can be found in the Care Plan section)  Acute Rehab PT Goals Patient Stated Goal: return to typicla mobility regimen PT Goal Formulation: With patient Time For Goal Achievement: 07/04/22 Potential to Achieve Goals: Good    Frequency Min 2X/week     Co-evaluation               AM-PAC PT "6 Clicks" Mobility  Outcome Measure Help needed turning from your back to your side while in a flat bed without using bedrails?: None Help needed moving from lying on your back to sitting on the side of a flat bed without using bedrails?: None Help needed moving to and from a bed to a chair (including a wheelchair)?: None Help needed standing up from a chair using your arms (e.g., wheelchair or bedside chair)?: None Help needed to walk in hospital room?: None Help needed climbing 3-5 steps with a railing? : A Little 6 Click Score: 23    End of Session Equipment Utilized During Treatment: Gait belt Activity Tolerance: Patient tolerated treatment well;Patient limited by pain Patient left: in bed;with call bell/phone within reach Nurse Communication: Mobility status PT Visit Diagnosis: Difficulty in walking, not elsewhere classified (R26.2)    Time: 4097-3532 PT Time Calculation (min) (ACUTE ONLY): 22 min   Charges:   PT Evaluation $PT Eval Moderate Complexity: 1 Mod         12:33 PM, 06/20/22 Etta Grandchild, PT, DPT Physical Therapist - [facility]  [phone] ([place])    Eugene Henderson C 06/20/2022, 12:30 PM

## 2022-06-20 NOTE — Evaluation (Signed)
Occupational Therapy Evaluation Patient Details Name: Eugene Henderson MRN: [id number] DOB: Apr 30, 1951 Today's Date: 06/20/2022   History of Present Illness Eugene Henderson is a 72yoM who comes to [facility] on 06/14/22 with CP and SOB. 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 In ED HR in 120-150s in RVR. BNP 575. Workup correlates pain symptoms with suspected cholecystitis. 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   Clinical Impression   PTA pt lived with family (daughter, granddaughter, DIL), was independent for ADLs, and received assistance PRN for IADLs. Pt currently functioning at independent for bed mobility, Mod I for functional mobility to the bathroom using RW, and Mod I for toilet transfer. He required supervision for clothing management in standing and sinkside grooming tasks. Pt appears close to baseline level of function with ADLs. Will keep on OT caseload to continue ADL training in order to prevent functional decline and reduce caregiver burden. No follow up OT needs recommended at this time.   Recommendations for follow up therapy are one component of a multi-disciplinary discharge planning process, led by the attending physician.  Recommendations may be updated based on patient status, additional functional criteria and insurance authorization.   Follow Up Recommendations  No OT follow up     Assistance Recommended at Discharge PRN  Patient can return home with the following Assistance with cooking/housework;Assist for transportation;Help with stairs or ramp for entrance    Functional Status Assessment  Patient has had a recent decline in their functional status and demonstrates the ability to make significant improvements in function in a reasonable and predictable amount of time.  Equipment Recommendations  None recommended by OT    Recommendations for Other Services       Precautions /  Restrictions Precautions Precautions: Fall Restrictions Weight Bearing Restrictions: No      Mobility Bed Mobility Overal bed mobility: Independent                  Transfers Overall transfer level: Modified independent Equipment used: Rolling walker (2 wheels)                      Balance Overall balance assessment: Modified Independent, No apparent balance deficits (not formally assessed)                                         ADL either performed or assessed with clinical judgement   ADL Overall ADL's : Needs assistance/impaired     Grooming: Standing;Wash/dry hands;Supervision/safety                   Toilet Transfer: Modified Independent;Grab bars;Regular Toilet;Rolling walker (2 wheels)   Toileting- Clothing Manipulation and Hygiene: Supervision/safety;Sit to/from stand       Functional mobility during ADLs: Modified independent;Rolling walker (2 wheels) (to the bathroom)       Vision Baseline Vision/History: 1 Wears glasses (readers) Patient Visual Report: No change from baseline Additional Comments: legally blind in R eye     Perception     Praxis      Pertinent Vitals/Pain Pain Assessment Pain Assessment: No/denies pain     Hand Dominance     Extremity/Trunk Assessment Upper Extremity Assessment Upper Extremity Assessment: Overall WFL for tasks assessed   Lower Extremity Assessment Lower Extremity Assessment: Generalized weakness  Communication Communication Communication: No difficulties   Cognition Arousal/Alertness: Awake/alert Behavior During Therapy: WFL for tasks assessed/performed Overall Cognitive Status: Within Functional Limits for tasks assessed                                       General Comments       Exercises Other Exercises Other Exercises: OT provided education re: role of OT, OT POC, post acute recs, sitting up for all meals, EOB/OOB mobility with  assistance, home/fall safety.     Shoulder Instructions      Home Living Family/patient expects to be discharged to:: Private residence Living Arrangements: Children (daughter, DIL, 15yo granddaughter (both work during day)) Available Help at Discharge: Family;Available PRN/intermittently Type of Home: House Home Access: Stairs to enter Entrance Stairs-Number of Steps: 1   Home Layout: Able to live on main level with bedroom/bathroom;Two level     Bathroom Shower/Tub: Tub/shower unit;Sponge bathes at baseline   [employer]: Standard     Home Equipment: Cane - single point;Grab bars - tub/shower;Shower seat   Additional Comments: has been using RW while admitted and feels more mobile      Prior Functioning/Environment Prior Level of Function : Independent/Modified Independent               ADLs Comments: Pt reports IND for ADLs (has been taking sponge baths past ~3 months due to full bath being upstairs), IND light meal prep, family assists with IADLs, pt not driving        OT Problem List: Decreased activity tolerance;Decreased strength      OT Treatment/Interventions: Self-care/ADL training;Therapeutic exercise;Therapeutic activities;Energy conservation;DME and/or AE instruction;Patient/family education;Balance training    OT Goals(Current goals can be found in the care plan section) Acute Rehab OT Goals Patient Stated Goal: return home, get stronger OT Goal Formulation: With patient Time For Goal Achievement: 07/04/22 Potential to Achieve Goals: Good   OT Frequency: Min 2X/week    Co-evaluation              AM-PAC OT "6 Clicks" Daily Activity     Outcome Measure Help from another person eating meals?: None Help from another person taking care of personal grooming?: None Help from another person toileting, which includes using toliet, bedpan, or urinal?: A Little Help from another person bathing (including washing, rinsing, drying)?: A  Little Help from another person to put on and taking off regular upper body clothing?: None Help from another person to put on and taking off regular lower body clothing?: A Little 6 Click Score: 21   End of Session Equipment Utilized During Treatment: Rolling walker (2 wheels);Gait belt Nurse Communication: Mobility status  Activity Tolerance: Patient tolerated treatment well Patient left: in bed;with call bell/phone within reach;with bed alarm set  OT Visit Diagnosis: Other abnormalities of gait and mobility (R26.89);Muscle weakness (generalized) (M62.81)                Time: 7169-6789 OT Time Calculation (min): 21 min Charges:  OT General Charges $OT Visit: 1 Visit OT Evaluation $OT Eval Low Complexity: 1 Low  [facility] MS, OTR/L ascom [phone]  06/20/22, 3:39 PM

## 2022-06-20 NOTE — Progress Notes (Signed)
Cardiology Progress Note  Patient ID: Eugene Henderson MRN: [id number] DOB: Nov 05, 1950 Date of Encounter: 06/20/2022  Primary Cardiologist: Lorine Bears, MD  Subjective   Chief Complaint: SOB  HPI: Volume status improving.  Heart rates a bit uncontrolled this morning.  Increasing metoprolol.  Denies chest pains.  ROS:  All other ROS reviewed and negative. Pertinent positives noted in the HPI.     Inpatient Medications  Scheduled Meds:  apixaban  5 mg Oral BID   aspirin EC  81 mg Oral Q0600   atorvastatin  40 mg Oral Daily   Chlorhexidine Gluconate Cloth  6 each Topical Q0600   docusate sodium  100 mg Oral Daily   empagliflozin  10 mg Oral Daily   furosemide  40 mg Oral Daily   metoprolol succinate  25 mg Oral Once   [START ON 06/21/2022] metoprolol succinate  50 mg Oral Daily   mometasone-formoterol  2 puff Inhalation BID   pantoprazole (PROTONIX) IV  40 mg Intravenous Q12H   potassium chloride  40 mEq Oral Once   sacubitril-valsartan  1 tablet Oral BID   spironolactone  25 mg Oral Daily   thiamine  100 mg Oral Daily   varenicline  1 mg Oral Daily   Continuous Infusions:  sodium chloride Stopped (06/15/22 0717)   sodium chloride     magnesium sulfate bolus IVPB     PRN Meds: sodium chloride, sodium chloride, acetaminophen **OR** acetaminophen, albuterol, alum & mag hydroxide-simeth, bisacodyl, guaiFENesin-dextromethorphan, hydrALAZINE, labetalol, magnesium citrate, magnesium sulfate bolus IVPB, metoprolol tartrate, morphine injection, nitroGLYCERIN, mouth rinse, oxyCODONE, phenol, polyethylene glycol, QUEtiapine   Vital Signs   Vitals:   06/19/22 2338 06/20/22 0649 06/20/22 0740 06/20/22 0826  BP: (!) 111/91 116/80  129/85  Pulse: 92 97  (!) 51  Resp: 16 18 20 18   Temp: 97.8 F (36.6 C) 97.7 F (36.5 C)  98.2 F (36.8 C)  TempSrc: Oral Oral    SpO2: 98% 98%  95%  Weight:      Height:        Intake/Output Summary (Last 24 hours) at 06/20/2022 1058 Last data  filed at 06/20/2022 0519 Gross per 24 hour  Intake 240 ml  Output 3675 ml  Net -3435 ml      06/17/2022    6:15 PM 06/17/2022   10:51 AM 06/15/2022    2:38 AM  Last 3 Weights  Weight (lbs) 274 lb 0.5 oz 270 lb 274 lb 0.5 oz  Weight (kg) 124.3 kg 122.471 kg 124.3 kg      Telemetry  Overnight telemetry shows A-fib heart rates 100s, which I personally reviewed.   Physical Exam   Vitals:   06/19/22 2338 06/20/22 0649 06/20/22 0740 06/20/22 0826  BP: (!) 111/91 116/80  129/85  Pulse: 92 97  (!) 51  Resp: 16 18 20 18   Temp: 97.8 F (36.6 C) 97.7 F (36.5 C)  98.2 F (36.8 C)  TempSrc: Oral Oral    SpO2: 98% 98%  95%  Weight:      Height:        Intake/Output Summary (Last 24 hours) at 06/20/2022 1058 Last data filed at 06/20/2022 0519 Gross per 24 hour  Intake 240 ml  Output 3675 ml  Net -3435 ml       06/17/2022    6:15 PM 06/17/2022   10:51 AM 06/15/2022    2:38 AM  Last 3 Weights  Weight (lbs) 274 lb 0.5 oz 270 lb 274 lb 0.5 oz  Weight (kg) 124.3 kg 122.471 kg 124.3 kg    Body mass index is 37.17 kg/m.  General: Well nourished, well developed, in no acute distress Head: Atraumatic, normal size  Eyes: PEERLA, EOMI  Neck: Supple, JVD 7-8 cmH2O Endocrine: No thryomegaly Cardiac: Normal S1, S2; irregular rhythm, no murmurs Lungs: Clear to auscultation bilaterally, no wheezing, rhonchi or rales  Abd: Soft, nontender, no hepatomegaly  Ext: No edema, pulses 2+ Musculoskeletal: No deformities, BUE and BLE strength normal and equal Skin: Warm and dry, no rashes   Neuro: Alert and oriented to person, place, time, and situation, CNII-XII grossly intact, no focal deficits  Psych: Normal mood and affect   Labs  High Sensitivity Troponin:   Recent Labs  Lab 06/14/22 1224 06/14/22 1413  TROPONINIHS 25* 28*     Cardiac EnzymesNo results for input(s): "TROPONINI" in the last 168 hours. No results for input(s): "TROPIPOC" in the last 168 hours.  Chemistry Recent Labs  Lab  06/18/22 0459 06/19/22 0415 06/20/22 0343  NA 136 131* 133*  K 4.1 3.4* 3.3*  CL 102 94* 93*  CO2 26 29 31   GLUCOSE 146* 114* 88  BUN 17 10 10   CREATININE 0.81 0.70 0.72  CALCIUM 8.0* 8.0* 8.2*  PROT 6.7 6.5 6.5  ALBUMIN 3.6 3.3* 3.2*  AST 96* 53* 32  ALT 137* 95* 68*  ALKPHOS 61 53 52  BILITOT 1.1 1.9* 1.7*  GFRNONAA >60 >60 >60  ANIONGAP 8 8 9     Hematology Recent Labs  Lab 06/18/22 0459 06/19/22 0415 06/20/22 0343  WBC 6.1 7.4 6.6  RBC 4.27 4.00* 4.10*  HGB 12.4* 11.7* 12.0*  HCT 38.4* 36.3* 36.9*  MCV 89.9 90.8 90.0  MCH 29.0 29.3 29.3  MCHC 32.3 32.2 32.5  RDW 14.7 14.6 14.6  PLT 149* 142* 137*   BNP Recent Labs  Lab 06/14/22 1321  BNP 575.5*    DDimer No results for input(s): "DDIMER" in the last 168 hours.   Radiology  No results found.  Cardiac Studies  TTE 06/16/2022  1. Left ventricular ejection fraction, by estimation, is 20 to 25%. The  left ventricle has severely decreased function. The left ventricle  demonstrates global hypokinesis. The left ventricular internal cavity size  was severely dilated. There is mild  left ventricular hypertrophy. Left ventricular diastolic function could  not be evaluated.   2. Right ventricular systolic function is severely reduced. The right  ventricular size is mildly enlarged. There is moderately elevated  pulmonary artery systolic pressure.   3. Left atrial size was severely dilated.   4. Right atrial size was severely dilated.   5. The mitral valve is normal in structure. Moderate mitral valve  regurgitation.   6. Tricuspid valve regurgitation is mild to moderate.   7. The aortic valve is tricuspid. Aortic valve regurgitation is trivial.  No aortic stenosis is present.   8. Aortic dilatation noted. 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 There is severe dilatation of the ascending aorta,  measuring 50 mm.   9. The inferior vena cava is dilated in size with <50% respiratory   variability, suggesting right atrial pressure of 15 mmHg.   Patient Profile  Eugene Henderson is a 72 y.o. male with systolic heart failure, permanent atrial fibrillation, diabetes, tobacco abuse who was admitted on 06/15/2022 for acute on chronic systolic heart failure.  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  He  is status post endovascular repair.   Assessment & Plan   # Acute on chronic systolic heart failure, EF 20 to 25% -Net -3.6 L overnight.  Net -8.6 L since admission.  He is effectively euvolemic on my exam.  Transition to 40 mg of p.o. Lasix. -We stopped his ARB.  Transition to Entresto 24-26 mg twice daily today. -He is on metoprolol succinate.  We are increasing the dose for A-fib control.  See discussion below. -Continue Aldactone 25 mg daily. -Start Jardiance 10 mg daily. -This is a nonischemic cardiomyopathy.  Left heart cath in 2018 with no significant CAD.  # Permanent A-fib -Increase metoprolol succinate to 50 mg daily.  We can increase the dose for better titration if needed.  We can also go back to metoprolol tartrate for better rate control.  Would continue with metoprolol succinate given history of systolic heart failure. -Eliquis restarted by primary team and in agreement with vascular surgery.  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  Doing well. -Continue aspirin and Eliquis.  Restart home statin today.    For questions or updates, please contact [place] Please consult [email or web address] for contact info under   Signed, [place] T. Audie Box, MD, [place] [facility] [place]  06/20/2022 10:58 AM

## 2022-06-20 NOTE — Progress Notes (Addendum)
PROGRESS NOTE   Eugene Henderson  XHB:[id number]    DOB: 08/29/1950    DOA: 06/14/2022  PCP: Valerie Roys, DO   I have briefly reviewed patients previous medical records in [facility].  Chief Complaint  Patient presents with   Chest Pain   Shortness of Breath    Brief Narrative:  72 year old male with PMH of HTN, HLD, type II DM, tobacco abuse, presented to the ED on 06/14/2022 with chest pain and dyspnea x 3 days.  He was initially admitted with diagnosis of abdominal pain.  No choledocholithiasis by imaging and GI signed off.  Cardiology consulted for perioperative CV risk assessment in anticipation of AAA and iliac aneurysm repairs, acute on chronic systolic CHF and A-fib.  Hospital course complicated by delirium.  He then underwent endovascular procedures for his AAA and iliac artery aneurysm on 1/11, postprocedure was admitted to ICU.  Slowly improving.  Transfer orders to telemetry bed on 1/12.  Slowly improving.  Possible DC home 1/15   Assessment & Plan:  Principal Problem:   Abdominal pain Active Problems:   Persistent atrial fibrillation (HCC)   Essential hypertension   Tobacco use   Chest pain   Diabetes mellitus without complication (HCC)   Morbid obesity (HCC)   Acute systolic heart failure (HCC)   AF (paroxysmal atrial fibrillation) (HCC)   Hyperlipidemia   Metal foreign body in head   Elevated bilirubin   Atrial fibrillation with RVR ([place])   Thoracoabdominal aortic aneurysm (TAAA) (HCC)   HFrEF (heart failure with reduced ejection fraction) ([place])   NICM (nonischemic cardiomyopathy) ([place])   Pre-operative cardiovascular examination   Abdominal pain: This was his admitting presentation.  Negative workup for choledocholithiasis..  GI recommended outpatient follow-up for cirrhosis, twice daily Protonix x 8 to 12 weeks, avoiding NSAIDs, no EGD was recommended and GI signed off.  Overall etiology unclear but wide DD: Gastritis, PUD, passive hepatic congestion from  decompensated CHF, AAA etc..  Abdominal pain has resolved  Acute respiratory distress Present on admission.  Suspected due to decompensated CHF.  Could have underlying OSA/OHS based on body habitus.  This will have to be evaluated as outpatient.  Incentive spirometry.  Upon careful review of chart since admission, no clear hypoxia documented despite patient having dyspnea and tachypnea.  Thereby acute respiratory failure with hypoxia ruled out.  No high oxygen.  Acute on chronic systolic CHF/nonischemic cardiomyopathy: 2D echo 2022: LVEF 30%.  Repeat echo this admission: LVEF 20-25%.  Prior left heart cath with no CAD.  Treated with IV Lasix 40 mg twice daily for a couple of days.  -8.7 L thus far and -3.7 L in the last 24 hours.  Cardiology follow-up appreciated.  Following medication changes done by them: Lasix changed to 40 mg p.o. daily, ARB stopped, started Entresto 24-26 mg twice daily, increased Toprol-XL to 250 Mg daily, continuing Aldactone 25 Mg daily, starting Jardiance 10 Mg daily, continuing Eliquis 5 Mg twice daily along with aspirin 81 Mg daily.  Monitor overnight and pending clinic stability and cardiac clearance, hopeful DC home 1/15  Hypokalemia: Replace aggressively and follow.  Persistent A-fib: Was on IV heparin post endovascular procedures.  Rate is controlled.  Was not on anticoagulation PTA.  Cardiology have changed metoprolol to succinate 50 Mg daily and can be titrated up as needed.  After cardiology and vascular surgery clearance, IV heparin was switched to Eliquis and aspirin 81 Mg daily.  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 Abdomen CTA revealed several abnormalities 1/8 (see for detailed report).  Following Cardiology preop clearance, patient underwent endovascular treatment of AAA and iliac artery aneurysms by vascular surgery on 1/11.  As per discussion with vascular surgery  team this morning, okay to transfer out of ICU.  Patient will need follow up with CT surgery after discharge for thoracic aortic aneurysm monitoring and management.    Delirium Resolved.   Essential hypertension Reasonably controlled.  As noted above, irbesartan discontinued, starting Entresto, changed to Toprol-XL and continuing spironolactone 25 Mg daily.     Metal foreign body in head non acute issue.  Outpatient follow-up.   Hyperlipidemia Continued statin.  Thrombocytopenia: May be related to heparin drip.  Follow daily CBCs.  Stable.  Mild transaminitis: Unclear etiology.  Almost resolved.   Body mass index is 37.17 kg/m./Morbid obesity This meets criteria for morbid obesity based on the presence of 1 or more chronic comorbidities. Patient has hypertension. This complicates overall care and prognosis.    DVT prophylaxis: Place TED hose Start: 06/14/22 1835.  Currently on IV heparin, switching to Eliquis and.   Code Status: Full Code:  Family Communication: Discussed in detail with patient's daughter via phone. Disposition:  Multiple medication changes per cardiology.  Monitor overnight and pending clinical stability and cardiology clearance, DC home on 1/15.  PT recommends no outpatient follow-up.     Consultants:   Gastroenterology Cardiology Vascular surgery  Procedures:   As noted above  Antimicrobials:      Subjective:  Reported some DOE and weakness after returning from the bathroom.  Has not been out of bed much.  No other complaints.  Objective:   Vitals:   06/20/22 0826 06/20/22 1200 06/20/22 1219 06/20/22 1252  BP: 129/85     Pulse: (!) 51 (!) 124    Resp: 18  18 18   Temp: 98.2 F (36.8 C)     TempSrc:      SpO2: 95%     Weight:      Height:        General exam: Elderly male, moderately built and obese lying comfortably propped up in bed without distress.  Appears to be in good spirits. Respiratory system: Clear to auscultation.  No  increased work of breathing. Cardiovascular system: S1 & S2 heard, RRR. No JVD, murmurs, rubs, gallops or clicks. No pedal edema.  Telemetry personally reviewed: A-fib with controlled ventricular rate/BBB morphology. Gastrointestinal system: Abdomen is nondistended, soft and nontender. No organomegaly or masses felt. Normal bowel sounds heard.  Bilateral groin endovascular interventions sites reexamined today and without acute findings. GU: Foley catheter has been discontinued.  Voiding spontaneously. Central nervous system: Alert and oriented. No focal neurological deficits. Extremities: Symmetric 5 x 5 power. Skin: No rashes, lesions or ulcers Psychiatry: Judgement and insight appear somewhat impaired. Mood & affect appropriate. 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, chronic    Data Reviewed:   I have personally reviewed following labs and imaging studies   CBC: Recent Labs  Lab 06/18/22 0459 06/19/22 0415 06/20/22 0343  WBC 6.1 7.4 6.6  HGB 12.4* 11.7* 12.0*  HCT 38.4* 36.3* 36.9*  MCV 89.9 90.8 90.0  PLT 149* 142* 137*    Basic Metabolic Panel: Recent Labs  Lab 06/14/22 1835 06/15/22 0304 06/17/22 0217 06/17/22 1730 06/18/22 0459 06/19/22 0415 06/20/22 0343  NA  --    < > 130* 135 136 131* 133*  K  --    < > 3.7 3.5 4.1 3.4* 3.3*  CL  --    < > 97* 98 102 94* 93*  CO2  --    < > 23 27 26 29 31   GLUCOSE  --    < > 102* 115* 146* 114* 88  BUN  --    < > 33* 22 17 10 10   CREATININE  --    < > 0.96 0.87 0.81 0.70 0.72  CALCIUM  --    < > 8.2* 7.9* 8.0* 8.0* 8.2*  MG 2.0  --   --   --   --   --  1.9  PHOS 4.2  --   --   --   --   --   --    < > = values in this interval not displayed.    Liver Function Tests: Recent Labs  Lab 06/16/22 0447 06/17/22 0217 06/18/22 0459 06/19/22 0415 06/20/22 0343  AST 122* 197* 96* 53* 32  ALT 88* 164* 137* 95* 68*  ALKPHOS 70 69 61 53 52  BILITOT 1.4* 1.7* 1.1 1.9* 1.7*  PROT 7.6 7.1 6.7 6.5 6.5   ALBUMIN 4.1 3.8 3.6 3.3* 3.2*    CBG: Recent Labs  Lab 06/17/22 2316 06/18/22 0307 06/18/22 0714  GLUCAP 149* 136* 159*    Microbiology Studies:   Recent Results (from the past 240 hour(s))  Resp panel by RT-PCR (RSV, Flu A&B, Covid) Anterior Nasal Swab     Status: None   Collection Time: 06/14/22  2:16 PM   Specimen: Anterior Nasal Swab  Result Value Ref Range Status   SARS Coronavirus 2 by RT PCR NEGATIVE NEGATIVE Final    Comment: (NOTE) SARS-CoV-2 target nucleic acids are NOT DETECTED.  The SARS-CoV-2 RNA is generally detectable in upper respiratory specimens during the acute phase of infection. The lowest concentration of SARS-CoV-2 viral copies this assay can detect is 138 copies/mL. A negative result does not preclude SARS-Cov-2 infection and should not be used as the sole basis for treatment or other patient management decisions. A negative result may occur with  improper specimen collection/handling, submission of specimen other than nasopharyngeal swab, presence of viral mutation(s) within the areas targeted by this assay, and inadequate number of viral copies(<138 copies/mL). A negative result must be combined with clinical observations, patient history, and epidemiological information. The expected result is Negative.  Fact Sheet for Patients:  08/17/22  Fact Sheet for Healthcare Providers:  08/13/22  This test is no t yet approved or cleared by the [employer] FDA and  has been authorized for detection and/or diagnosis of SARS-CoV-2 by FDA under an Emergency Use Authorization (EUA). This EUA will remain  in effect (meaning this test can be used) for the duration of the COVID-19 declaration under Section 564(b)(1) of the Act, 21 U.S.C.section 360bbb-3(b)(1), unless the authorization is terminated  or revoked sooner.       Influenza A by PCR NEGATIVE NEGATIVE Final   Influenza B by  PCR NEGATIVE NEGATIVE Final    Comment: (NOTE) The Xpert Xpress SARS-CoV-2/FLU/RSV plus assay is intended as an aid in the diagnosis of influenza from Nasopharyngeal swab specimens and should not be used as a sole basis for treatment. Nasal washings and aspirates are unacceptable for Xpert Xpress SARS-CoV-2/FLU/RSV testing.  Fact Sheet for Patients: SeriousBroker.it  Fact Sheet for Healthcare Providers: [place]  This test is not yet approved or cleared by the [employer] FDA and has  been authorized for detection and/or diagnosis of SARS-CoV-2 by FDA under an Emergency Use Authorization (EUA). This EUA will remain in effect (meaning this test can be used) for the duration of the COVID-19 declaration under Section 564(b)(1) of the Act, 21 U.S.C. section 360bbb-3(b)(1), unless the authorization is terminated or revoked.     Resp Syncytial Virus by PCR NEGATIVE NEGATIVE Final    Comment: (NOTE) Fact Sheet for Patients: EntrepreneurPulse.com.au  Fact Sheet for Healthcare Providers: IncredibleEmployment.be  This test is not yet approved or cleared by the [place] FDA and has been authorized for detection and/or diagnosis of SARS-CoV-2 by FDA under an Emergency Use Authorization (EUA). This EUA will remain in effect (meaning this test can be used) for the duration of the COVID-19 declaration under Section 564(b)(1) of the Act, 21 U.S.C. section 360bbb-3(b)(1), unless the authorization is terminated or revoked.  Performed at [facility], [address] [place], [place] [address]   Culture, blood (Routine X 2) w Reflex to ID Panel     Status: None   Collection Time: 06/14/22  6:56 PM   Specimen: BLOOD RIGHT ARM  Result Value Ref Range Status   Specimen Description BLOOD RIGHT ARM  Final   Special Requests   Final    BOTTLES DRAWN AEROBIC AND ANAEROBIC Blood Culture  adequate volume   Culture   Final    NO GROWTH 5 DAYS Performed at [facility], [address], [place], [place] [address]    Report Status 06/19/2022 FINAL  Final  Culture, blood (Routine X 2) w Reflex to ID Panel     Status: None   Collection Time: 06/14/22  6:56 PM   Specimen: BLOOD  Result Value Ref Range Status   Specimen Description BLOOD RIGHT HAND  Final   Special Requests   Final    BOTTLES DRAWN AEROBIC AND ANAEROBIC Blood Culture adequate volume   Culture   Final    NO GROWTH 5 DAYS Performed at [facility], [address] [place], [place] [address]    Report Status 06/19/2022 FINAL  Final  MRSA Next Gen by PCR, Nasal     Status: None   Collection Time: 06/17/22  6:21 PM   Specimen: Nasal Mucosa; Nasal Swab  Result Value Ref Range Status   MRSA by PCR Next Gen NOT DETECTED NOT DETECTED Final    Comment: (NOTE) The GeneXpert MRSA Assay (FDA approved for NASAL specimens only), is one component of a comprehensive MRSA colonization surveillance program. It is not intended to diagnose MRSA infection nor to guide or monitor treatment for MRSA infections. Test performance is not FDA approved in patients less than 67 years old. Performed at [facility], [address] [place], [place] [address]     Radiology Studies:  No results found.  Scheduled Meds:    apixaban  5 mg Oral BID   aspirin EC  81 mg Oral Q0600   atorvastatin  40 mg Oral Daily   Chlorhexidine Gluconate Cloth  6 each Topical Q0600   docusate sodium  100 mg Oral Daily   empagliflozin  10 mg Oral Daily   furosemide  40 mg Oral Daily   [START ON 06/21/2022] metoprolol succinate  50 mg Oral Daily   mometasone-formoterol  2 puff Inhalation BID   pantoprazole (PROTONIX) IV  40 mg Intravenous Q12H   sacubitril-valsartan  1 tablet Oral BID   spironolactone  25 mg Oral Daily   thiamine  100 mg Oral  Daily   varenicline  1 mg Oral Daily    Continuous Infusions:    sodium  chloride Stopped (06/15/22 0717)   sodium chloride     magnesium sulfate bolus IVPB       LOS: 6 days     Marcellus Scott, MD,  FACP, [facility], [facility], [facility], [facility]   [facility] [place]     To contact the attending provider between 7A-7P or the covering provider during after hours 7P-7A, please log into the web site [email or web address] and access using universal [place] password for that web site. If you do not have the password, please call the hospital operator.  06/20/2022, 2:23 PM

## 2022-06-21 ENCOUNTER — Other Ambulatory Visit: Payer: Self-pay

## 2022-06-21 ENCOUNTER — Other Ambulatory Visit (HOSPITAL_COMMUNITY): Payer: Self-pay

## 2022-06-21 ENCOUNTER — Inpatient Hospital Stay: Payer: Medicare HMO

## 2022-06-21 DIAGNOSIS — I4891 Unspecified atrial fibrillation: Secondary | ICD-10-CM | POA: Diagnosis not present

## 2022-06-21 DIAGNOSIS — I5021 Acute systolic (congestive) heart failure: Secondary | ICD-10-CM | POA: Diagnosis not present

## 2022-06-21 LAB — COMPREHENSIVE METABOLIC PANEL
ALT: 50 U/L — ABNORMAL HIGH (ref 0–44)
AST: 20 U/L (ref 15–41)
Albumin: 3.2 g/dL — ABNORMAL LOW (ref 3.5–5.0)
Alkaline Phosphatase: 54 U/L (ref 38–126)
Anion gap: 11 (ref 5–15)
BUN: 12 mg/dL (ref 8–23)
CO2: 25 mmol/L (ref 22–32)
Calcium: 8.2 mg/dL — ABNORMAL LOW (ref 8.9–10.3)
Chloride: 98 mmol/L (ref 98–111)
Creatinine, Ser: 0.71 mg/dL (ref 0.61–1.24)
GFR, Estimated: >60 mL/min (ref >60)
Glucose, Bld: 92 mg/dL (ref 70–99)
Potassium: 3.7 mmol/L (ref 3.5–5.1)
Sodium: 134 mmol/L — ABNORMAL LOW (ref 135–145)
Total Bilirubin: 1.7 mg/dL — ABNORMAL HIGH (ref 0.3–1.2)
Total Protein: 6.8 g/dL (ref 6.5–8.1)

## 2022-06-21 LAB — CBC
HCT: 38.9 % — ABNORMAL LOW (ref 39.0–52.0)
Hemoglobin: 12.9 g/dL — ABNORMAL LOW (ref 13.0–17.0)
MCH: 29.4 pg (ref 26.0–34.0)
MCHC: 33.2 g/dL (ref 30.0–36.0)
MCV: 88.6 fL (ref 80.0–100.0)
Platelets: 160 10*3/uL (ref 150–400)
RBC: 4.39 MIL/uL (ref 4.22–5.81)
RDW: 14.6 % (ref 11.5–15.5)
WBC: 6.1 10*3/uL (ref 4.0–10.5)
nRBC: 0 % (ref 0.0–0.2)

## 2022-06-21 MED ORDER — AMIODARONE HCL IN DEXTROSE 360-4.14 MG/200ML-% IV SOLN
60.0000 mg/h | INTRAVENOUS | Status: DC
  Administered 2022-06-21 – 2022-06-22 (×3): 30 mg/h via INTRAVENOUS
  Administered 2022-06-23 (×2): 60 mg/h via INTRAVENOUS
  Administered 2022-06-23: 30 mg/h via INTRAVENOUS
  Administered 2022-06-24 – 2022-06-25 (×6): 60 mg/h via INTRAVENOUS
  Filled 2022-06-21 (×13): qty 200

## 2022-06-21 MED ORDER — AMIODARONE HCL IN DEXTROSE 360-4.14 MG/200ML-% IV SOLN
60.0000 mg/h | INTRAVENOUS | Status: AC
  Administered 2022-06-21: 60 mg/h via INTRAVENOUS
  Filled 2022-06-21: qty 200

## 2022-06-21 MED ORDER — SENNOSIDES-DOCUSATE SODIUM 8.6-50 MG PO TABS
1.0000 | ORAL_TABLET | Freq: Two times a day (BID) | ORAL | Status: DC
  Administered 2022-06-22 – 2022-06-26 (×7): 1 via ORAL
  Filled 2022-06-21 (×9): qty 1

## 2022-06-21 MED ORDER — POTASSIUM CHLORIDE 20 MEQ PO PACK
40.0000 meq | PACK | Freq: Once | ORAL | Status: AC
  Administered 2022-06-21: 40 meq via ORAL
  Filled 2022-06-21: qty 2

## 2022-06-21 MED ORDER — DIGOXIN 250 MCG PO TABS
0.2500 mg | ORAL_TABLET | Freq: Once | ORAL | Status: AC
  Administered 2022-06-21: 0.25 mg via ORAL
  Filled 2022-06-21: qty 1

## 2022-06-21 MED ORDER — AMIODARONE LOAD VIA INFUSION
150.0000 mg | Freq: Once | INTRAVENOUS | Status: AC
  Administered 2022-06-21: 150 mg via INTRAVENOUS
  Filled 2022-06-21: qty 83.34

## 2022-06-21 MED ORDER — POLYETHYLENE GLYCOL 3350 17 G PO PACK
17.0000 g | PACK | Freq: Every day | ORAL | Status: DC
  Administered 2022-06-21 – 2022-06-26 (×3): 17 g via ORAL
  Filled 2022-06-21 (×5): qty 1

## 2022-06-21 NOTE — Care Management Important Message (Signed)
Important Message  Patient Details  Name: Eugene Henderson MRN: [id number] Date of Birth: [birth year]   Medicare Important Message Given:  Yes     [name] 06/21/2022, 12:08 PM

## 2022-06-21 NOTE — Progress Notes (Signed)
  Progress Note    06/21/2022 2:42 PM 4 Days Post-Op  Subjective:  [name] is a 72 year old male with PMH of HTN, HLD, type II DM, tobacco abuse, presented to the ED on 06/14/2022 with chest pain and dyspnea x 3 days. He was initially admitted with diagnosis of abdominal pain. Cardiology consulted for perioperative CV risk assessment in anticipation of AAA and iliac aneurysm repairs, acute on chronic systolic CHF and A-fib.  Patient is now status post AAA repair with iliac aneurysm repair.  No complications to note.  Patient's vitals all remained stable.  He has not been able to be very mobile due to his current cardiac condition.  Patient states he is scheduled for cardioversion tomorrow.   Vitals:   06/21/22 1152 06/21/22 1200  BP: (!) 133/97   Pulse:    Resp: (!) 25   Temp:    SpO2:  100%   Physical Exam: Cardiac:  Tachycardic and irregular.  Lungs:  Clear with rales in the bases today Incisions:  C/D/I Extremities:  Both Groin sites without hematoma or seroma. Palpable Pedal pulses. Abdomen:  Positive bowel sounds, soft, NT/ND Neurologic: AAOX3   CBC    Component Value Date/Time   WBC 6.1 06/21/2022 0436   RBC 4.39 06/21/2022 0436   HGB 12.9 (L) 06/21/2022 0436   HGB 14.3 06/18/2021 1409   HCT 38.9 (L) 06/21/2022 0436   HCT 42.1 06/18/2021 1409   PLT 160 06/21/2022 0436   PLT 154 06/18/2021 1409   MCV 88.6 06/21/2022 0436   MCV 86 06/18/2021 1409   MCH 29.4 06/21/2022 0436   MCHC 33.2 06/21/2022 0436   RDW 14.6 06/21/2022 0436   RDW 15.1 06/18/2021 1409   LYMPHSABS 1.3 06/18/2021 1409   MONOABS 0.7 05/14/2017 0351   EOSABS 0.1 06/18/2021 1409   BASOSABS 0.0 06/18/2021 1409    BMET    Component Value Date/Time   NA 134 (L) 06/21/2022 0436   NA 139 06/18/2021 1409   K 3.7 06/21/2022 0436   CL 98 06/21/2022 0436   CO2 25 06/21/2022 0436   GLUCOSE 92 06/21/2022 0436   BUN 12 06/21/2022 0436   BUN 13 06/18/2021 1409   CREATININE 0.71 06/21/2022 0436    CALCIUM 8.2 (L) 06/21/2022 0436   GFRNONAA >60 06/21/2022 0436   GFRAA 96 03/28/2020 1524    INR    Component Value Date/Time   INR 1.3 (H) 06/17/2022 1730     Intake/Output Summary (Last 24 hours) at 06/21/2022 1442 Last data filed at 06/21/2022 1301 Gross per 24 hour  Intake 480 ml  Output 3525 ml  Net -3045 ml     Assessment/Plan:  72 y.o. male is s/p AAA and ileac aneurysm repair. 4 Days Post-Op  Continue Eliquis and ASA medications. Resume statin therapy. Cardiology to Cardiovert tomorrow for rapid Afib.  DVT prophylaxis:  Eliquis and ASA   Drema Pry [facility] 06/21/2022 2:42 PM

## 2022-06-21 NOTE — Progress Notes (Signed)
PROGRESS NOTE   Eugene Henderson  OMV:[id number]    DOB: 1950/07/28    DOA: 06/14/2022  PCP: Dorcas Carrow, DO   I have briefly reviewed patients previous medical records in [facility].  Chief Complaint  Patient presents with   Chest Pain   Shortness of Breath    Brief Narrative:  72 year old male with PMH of HTN, HLD, type II DM, tobacco abuse, presented to the ED on 06/14/2022 with chest pain and dyspnea x 3 days.  He was initially admitted with diagnosis of abdominal pain.  No choledocholithiasis by imaging and GI signed off.  Cardiology consulted for perioperative CV risk assessment in anticipation of AAA and iliac aneurysm repairs, acute on chronic systolic CHF and A-fib.  Hospital course complicated by delirium.  He then underwent endovascular procedures for his AAA and iliac artery aneurysm on 1/11, postprocedure was admitted to ICU.  Slowly improving.  Transfer orders to telemetry bed on 1/12.  Slowly improving.  1/15: A-fib with RVR, advanced HF cardiologist consulting, started IV amiodarone with plan for TEE/DCCV 1/16.   Assessment & Plan:  Principal Problem:   Abdominal pain Active Problems:   Persistent atrial fibrillation (HCC)   Essential hypertension   Tobacco use   Chest pain   Diabetes mellitus without complication (HCC)   Morbid obesity (HCC)   Acute systolic heart failure (HCC)   AF (paroxysmal atrial fibrillation) (HCC)   Hyperlipidemia   Metal foreign body in head   Elevated bilirubin   Atrial fibrillation with RVR (HCC)   Thoracoabdominal aortic aneurysm (TAAA) (HCC)   HFrEF (heart failure with reduced ejection fraction) (HCC)   NICM (nonischemic cardiomyopathy) (HCC)   Pre-operative cardiovascular examination   Abdominal pain: This was his admitting presentation.  Negative workup for choledocholithiasis..  GI recommended outpatient follow-up for cirrhosis, twice daily Protonix x 8 to 12 weeks, avoiding NSAIDs, no EGD was recommended and GI signed  off.  Overall etiology unclear but wide DD: Gastritis, PUD, passive hepatic congestion from decompensated CHF, AAA etc..  Abdominal pain has resolved  Acute respiratory distress Present on admission.  Suspected due to decompensated CHF.  Could have underlying OSA/OHS based on body habitus.  This will have to be evaluated as outpatient.  Incentive spirometry.  Upon careful review of chart since admission, no clear hypoxia documented despite patient having dyspnea and tachypnea.  Thereby acute respiratory failure with hypoxia ruled out.  No high oxygen.  Acute on chronic systolic CHF/nonischemic cardiomyopathy: 2D echo 2022: LVEF 30%.  Repeat echo this admission: LVEF 20-25%.  Prior left heart cath with no CAD.  Treated with IV Lasix 40 mg twice daily for a couple of days.  14 L thus far and - 4.9 L in the last 24 hours.  Cardiology follow-up appreciated.  Following medication changes done by them: Lasix changed to 40 mg p.o. daily, ARB stopped, started Entresto 24-26 mg twice daily, increased Toprol-XL to 50 Mg daily, continuing Aldactone 25 Mg daily, starting Jardiance 10 Mg daily, continuing Eliquis 5 Mg twice daily along with aspirin 81 Mg daily.  Clinically euvolemic.  Chest x-ray without acute findings.  Hypokalemia: Potassium 3.7, continue to replace to try and keep >4.  Persistent A-fib with RVR: Was on IV heparin post endovascular procedures. Was not on anticoagulation PTA.  Cardiology have changed metoprolol to succinate 50 Mg daily and can be titrated up as needed.  Continue Eliquis 5 Mg twice daily, aspirin 81 Mg daily.  1/15, A-fib with RVR up  to 170s.  Cardiology initiated digoxin load, IV amiodarone with plan for TEE/DCCV on 1/16.  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 Abdomen CTA revealed several abnormalities 1/8 (see for detailed report).  Following Cardiology preop clearance, patient  underwent endovascular treatment of AAA and iliac artery aneurysms by vascular surgery on 1/11. Patient will need follow up with CT surgery after discharge for thoracic aortic aneurysm monitoring and management.    Delirium Resolved.   Essential hypertension Reasonably controlled.  As noted above, irbesartan discontinued, starting Entresto, changed to Toprol-XL and continuing spironolactone 25 Mg daily.     Metal foreign body in head non acute issue.  Outpatient follow-up.   Hyperlipidemia Continued statin.  Thrombocytopenia: Resolved.  Mild transaminitis: Unclear etiology.  Resolved.  Isolated hyperbilirubinemia, stable, unclear etiology.   Body mass index is 37.17 kg/m./Morbid obesity This meets criteria for morbid obesity based on the presence of 1 or more chronic comorbidities. Patient has hypertension. This complicates overall care and prognosis.    DVT prophylaxis: Place TED hose Start: 06/14/22 1835.  Eliquis.   Code Status: Full Code:  Family Communication: None at bedside.. Disposition:  Now on IV amiodarone drip with plans for TEE/DCCV 1/16.     Consultants:   Gastroenterology Cardiology Vascular surgery  Procedures:   As noted above  Antimicrobials:      Subjective:  Seen earlier this morning.  Patient reported chronic low back pain, worsened by current hospital bed, had to change positions every so often.  Reported constipation with hard stools.  Dyspnea on exertion.   Subsequently around noon, RN alerted that patient was having chest pain.  Went back and reviewed patient at bedside.  Reported feeling like something was stuck in his throat, cleared after throat clearing and coughing.  Then had lower midsternal chest pain, reportedly dumped his chest heart and even had a big burp following by improving of his chest pain.  Currently during my evaluation without chest pain.  No dyspnea either.  Objective:   Vitals:   06/21/22 1100 06/21/22 1145 06/21/22  1152 06/21/22 1200  BP: 122/71 (!) 128/103 (!) 133/97   Pulse:      Resp: (!) 21 (!) 23 (!) 25   Temp: 98.7 F (37.1 C)     TempSrc: Oral     SpO2: 100% 99%  100%  Weight:      Height:        General exam: Elderly male, moderately built and obese sitting up comfortably in bed without distress.  Patient's RN at bedside this afternoon. Respiratory system: Occasional basal crackles but otherwise clear to auscultation. Cardiovascular system: S1 and S2 heard, irregularly irregular and tachycardic.  No JVD, murmurs or pedal edema.  Telemetry this morning had shown A-fib with ventricular rate in the 90s-110s overnight but at around 9 AM, RVR up to 170s.  Currently in the afternoon was in the 110s. Gastrointestinal system: Abdomen is nondistended, soft and nontender. No organomegaly or masses felt. Normal bowel sounds heard.  Bilateral groin endovascular interventions sites reexamined today and without acute findings. GU: Foley catheter has been discontinued.  Voiding spontaneously. Central nervous system: Alert and oriented. No focal neurological deficits. Extremities: Symmetric 5 x 5 power. Skin: No rashes, lesions or ulcers Psychiatry: Judgement and insight appear somewhat impaired. Mood & affect appropriate. 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, chronic    Data Reviewed:   I have personally  reviewed following labs and imaging studies   CBC: Recent Labs  Lab 06/19/22 0415 06/20/22 0343 06/21/22 0436  WBC 7.4 6.6 6.1  HGB 11.7* 12.0* 12.9*  HCT 36.3* 36.9* 38.9*  MCV 90.8 90.0 88.6  PLT 142* 137* 160    Basic Metabolic Panel: Recent Labs  Lab 06/14/22 1835 06/15/22 0304 06/17/22 1730 06/18/22 0459 06/19/22 0415 06/20/22 0343 06/21/22 0436  NA  --    < > 135 136 131* 133* 134*  K  --    < > 3.5 4.1 3.4* 3.3* 3.7  CL  --    < > 98 102 94* 93* 98  CO2  --    < > 27 26 29 31 25   GLUCOSE  --    < > 115* 146* 114* 88 92  BUN  --    < > 22 17 10 10 12    CREATININE  --    < > 0.87 0.81 0.70 0.72 0.71  CALCIUM  --    < > 7.9* 8.0* 8.0* 8.2* 8.2*  MG 2.0  --   --   --   --  1.9  --   PHOS 4.2  --   --   --   --   --   --    < > = values in this interval not displayed.    Liver Function Tests: Recent Labs  Lab 06/17/22 0217 06/18/22 0459 06/19/22 0415 06/20/22 0343 06/21/22 0436  AST 197* 96* 53* 32 20  ALT 164* 137* 95* 68* 50*  ALKPHOS 69 61 53 52 54  BILITOT 1.7* 1.1 1.9* 1.7* 1.7*  PROT 7.1 6.7 6.5 6.5 6.8  ALBUMIN 3.8 3.6 3.3* 3.2* 3.2*    CBG: Recent Labs  Lab 06/17/22 2316 06/18/22 0307 06/18/22 0714  GLUCAP 149* 136* 159*    Microbiology Studies:   Recent Results (from the past 240 hour(s))  Resp panel by RT-PCR (RSV, Flu A&B, Covid) Anterior Nasal Swab     Status: None   Collection Time: 06/14/22  2:16 PM   Specimen: Anterior Nasal Swab  Result Value Ref Range Status   SARS Coronavirus 2 by RT PCR NEGATIVE NEGATIVE Final    Comment: (NOTE) SARS-CoV-2 target nucleic acids are NOT DETECTED.  The SARS-CoV-2 RNA is generally detectable in upper respiratory specimens during the acute phase of infection. The lowest concentration of SARS-CoV-2 viral copies this assay can detect is 138 copies/mL. A negative result does not preclude SARS-Cov-2 infection and should not be used as the sole basis for treatment or other patient management decisions. A negative result may occur with  improper specimen collection/handling, submission of specimen other than nasopharyngeal swab, presence of viral mutation(s) within the areas targeted by this assay, and inadequate number of viral copies(<138 copies/mL). A negative result must be combined with clinical observations, patient history, and epidemiological information. The expected result is Negative.  Fact Sheet for Patients:  08/17/22  Fact Sheet for Healthcare Providers:  08/13/22  This test is no t  yet approved or cleared by the [employer] FDA and  has been authorized for detection and/or diagnosis of SARS-CoV-2 by FDA under an Emergency Use Authorization (EUA). This EUA will remain  in effect (meaning this test can be used) for the duration of the COVID-19 declaration under Section 564(b)(1) of the Act, 21 U.S.C.section 360bbb-3(b)(1), unless the authorization is terminated  or revoked sooner.       Influenza A by PCR NEGATIVE NEGATIVE Final  Influenza B by PCR NEGATIVE NEGATIVE Final    Comment: (NOTE) The Xpert Xpress SARS-CoV-2/FLU/RSV plus assay is intended as an aid in the diagnosis of influenza from Nasopharyngeal swab specimens and should not be used as a sole basis for treatment. Nasal washings and aspirates are unacceptable for Xpert Xpress SARS-CoV-2/FLU/RSV testing.  Fact Sheet for Patients: [employer]  Fact Sheet for Healthcare Providers: [employer]ious[employer]ker[employer]it  This test is not yet approved or cleared by the [place] FDA and has been authorized for detection and/or diagnosis of SARS-CoV-2 by FDA under an Emergency Use Authorization (EUA). This EUA will remain in effect (meaning this test can be used) for the duration of the COVID-19 declaration under Section 564(b)(1) of the Act, 21 U.S.C. section 360bbb-3(b)(1), unless the authorization is terminated or revoked.     Resp Syncytial Virus by PCR NEGATIVE NEGATIVE Final    Comment: (NOTE) Fact Sheet for Patients: [employer]com  Fact Sheet for Healthcare Providers: SeriousBroker.it  This test is not yet approved or cleared by the [place] FDA and has been authorized for detection and/or diagnosis of SARS-CoV-2 by FDA under an Emergency Use Authorization (EUA). This EUA will remain in effect (meaning this test can be used) for the duration of the COVID-19 declaration under Section 564(b)(1)  of the Act, 21 U.S.C. section 360bbb-3(b)(1), unless the authorization is terminated or revoked.  Performed at [facility], [address] [place], [place] [address]   Culture, blood (Routine X 2) w Reflex to ID Panel     Status: None   Collection Time: 06/14/22  6:56 PM   Specimen: BLOOD RIGHT ARM  Result Value Ref Range Status   Specimen Description BLOOD RIGHT ARM  Final   Special Requests   Final    BOTTLES DRAWN AEROBIC AND ANAEROBIC Blood Culture adequate volume   Culture   Final    NO GROWTH 5 DAYS Performed at [facility], [address], [place], [place] [address]    Report Status 06/19/2022 FINAL  Final  Culture, blood (Routine X 2) w Reflex to ID Panel     Status: None   Collection Time: 06/14/22  6:56 PM   Specimen: BLOOD  Result Value Ref Range Status   Specimen Description BLOOD RIGHT HAND  Final   Special Requests   Final    BOTTLES DRAWN AEROBIC AND ANAEROBIC Blood Culture adequate volume   Culture   Final    NO GROWTH 5 DAYS Performed at [facility], [address], [place], [place] [address]    Report Status 06/19/2022 FINAL  Final  MRSA Next Gen by PCR, Nasal     Status: None   Collection Time: 06/17/22  6:21 PM   Specimen: Nasal Mucosa; Nasal Swab  Result Value Ref Range Status   MRSA by PCR Next Gen NOT DETECTED NOT DETECTED Final    Comment: (NOTE) The GeneXpert MRSA Assay (FDA approved for NASAL specimens only), is one component of a comprehensive MRSA colonization surveillance program. It is not intended to diagnose MRSA infection nor to guide or monitor treatment for MRSA infections. Test performance is not FDA approved in patients less than 44 years old. Performed at [facility], [address] [place], [place] [address]     Radiology Studies:  [facility] Chest [place] 1 View  Result Date: 06/21/2022 CLINICAL DATA:  Shortness of breath. EXAM: PORTABLE CHEST 1 VIEW COMPARISON:  06/14/2022 chest radiograph  and CT chest. FINDINGS: Trachea is midline. Heart is enlarged. No  airspace consolidation or pleural fluid. IMPRESSION: No acute findings. Electronically Signed   By: Lorin Picket M.D.   On: 06/21/2022 12:58    Scheduled Meds:    apixaban  5 mg Oral BID   aspirin EC  81 mg Oral Q0600   atorvastatin  40 mg Oral Daily   Chlorhexidine Gluconate Cloth  6 each Topical Q0600   docusate sodium  100 mg Oral Daily   empagliflozin  10 mg Oral Daily   furosemide  40 mg Oral Daily   metoprolol succinate  50 mg Oral Daily   mometasone-formoterol  2 puff Inhalation BID   pantoprazole (PROTONIX) IV  40 mg Intravenous Q12H   sacubitril-valsartan  1 tablet Oral BID   spironolactone  25 mg Oral Daily   thiamine  100 mg Oral Daily   varenicline  1 mg Oral Daily    Continuous Infusions:    sodium chloride Stopped (06/15/22 0717)   sodium chloride     amiodarone 60 mg/hr (06/21/22 1129)   Followed by   amiodarone       LOS: 7 days     Vernell Leep, MD,  FACP, [facility], [facility], [facility], [place]     To contact the attending provider between 7A-7P or the covering provider during after hours 7P-7A, please log into the web site [email or web address] and access using universal [place] password for that web site. If you do not have the password, please call the hospital operator.  06/21/2022, 3:37 PM

## 2022-06-21 NOTE — Consult Note (Addendum)
ADVANCED HEART FAILURE CONSULT NOTE  Referring Physician: No ref. provider found  Primary Care: Dorcas Carrow, DO Primary Cardiologist: Dr. Kirke Corin  HPI: Eugene Henderson is a 72 y.o. male with HTN, T2DM, HFrEF, long standing persistent atrial fibrillation, hx of tobacco use admitted on 06/14/21 with complains of chest pain and shortness of breath. On admission, patient noted to be in atrial fibrillation w/ RVR; BNP elevated to ~600 with stable hstroponin. Cardiology was consulted for HFrEF exacerbation. Since that time he has been aggressively diuresed and started on GDMT over the weekend. Today on exam, patient now euvolemic with only complaints being lower extremity pain and arthritis. Despite, volume management & addition of beta-blockade, patient remains in atrial fibrillation with rates exceeding 150BPM with minimal exertion. From a functional standpoint, [name] states at baseline he performs all ADLs independently. He becomes SOB when walking >30-68ft.   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     Past Medical History:  Diagnosis Date   Arthritis    Ascending aortic aneurysm (HCC)    a. 09/2017 Stable TAA - 5.1cm.   Asthma    Chronic systolic CHF (congestive heart failure) (HCC)    a. EF 25-30% by echo in 07/2016 with cath showing no significant CAD b. 01/2017: EF 30-35% with diffuse HK and moderate MR; c. 07/2017 Echo: EF 30-35%, diff hK. Mild MR, mildly dil LA. PASP .   Diverticulitis    Diverticulitis of large intestine with perforation without abscess or bleeding 05/13/2017   Hypertension    Hyperthyroidism    NICM (nonischemic cardiomyopathy) (HCC)    Noncompliance    Persistent atrial fibrillation (HCC)    a. CHA2DS2VASc = 3-->Eliquis (? compliance).    Current Facility-Administered Medications  Medication Dose Route Frequency  Provider Last Rate Last Admin   0.9 %  sodium chloride infusion   Intravenous PRN Schnier, Latina Craver, MD   Stopped at 06/15/22 0717   0.9 %  sodium chloride infusion  500 mL Intravenous Once PRN Schnier, Latina Craver, MD       acetaminophen (TYLENOL) tablet 325-650 mg  325-650 mg Oral Q4H PRN Schnier, Latina Craver, MD   650 mg at 06/20/22 0175   Or   acetaminophen (TYLENOL) suppository 325-650 mg  325-650 mg Rectal Q4H PRN Schnier, Latina Craver, MD       albuterol (PROVENTIL) (2.5 MG/3ML) 0.083% nebulizer solution 2.5 mg  2.5 mg Nebulization Q4H PRN Schnier, Latina Craver, MD   2.5 mg at 06/15/22 1702   alum & mag hydroxide-simeth (MAALOX/MYLANTA) 200-200-20 MG/5ML suspension 15-30 mL  15-30 mL Oral Q2H PRN Schnier, Latina Craver, MD       amiodarone (NEXTERONE) 1.8 mg/mL load via infusion 150 mg  150 mg Intravenous Once Julena Barbour, DO       Followed by   amiodarone (NEXTERONE PREMIX) 360-4.14 MG/200ML-% (1.8 mg/mL) IV infusion  60 mg/hr Intravenous Continuous Miasia Crabtree, DO       Followed by   amiodarone (NEXTERONE PREMIX) 360-4.14 MG/200ML-% (1.8 mg/mL) IV infusion  30 mg/hr Intravenous Continuous Dmiyah Liscano, DO       apixaban (ELIQUIS) tablet 5 mg  5 mg Oral BID Coulter, Carolyn, RPH   5 mg at 06/20/22 2203   aspirin EC tablet 81 mg  81 mg Oral Q0600 Renford Dills, MD   81 mg at 06/21/22 [phone]  atorvastatin (LIPITOR) tablet 40 mg  40 mg Oral Daily O'Neal, Ronnald Ramp, MD   40 mg at 06/20/22 1214   bisacodyl (DULCOLAX) EC tablet 5 mg  5 mg Oral Daily PRN Schnier, Latina Craver, MD       Chlorhexidine Gluconate Cloth 2 % PADS 6 each  6 each Topical Q0600 Leeroy Bock, MD   6 each at 06/21/22 0537   digoxin (LANOXIN) tablet 0.25 mg  0.25 mg Oral Once Darrien Laakso, DO       docusate sodium (COLACE) capsule 100 mg  100 mg Oral Daily Schnier, Latina Craver, MD   100 mg at 06/20/22 0841   empagliflozin (JARDIANCE) tablet 10 mg  10 mg Oral Daily Sande Rives, MD   10 mg at  06/20/22 0841   furosemide (LASIX) tablet 40 mg  40 mg Oral Daily Sande Rives, MD   40 mg at 06/20/22 1214   guaiFENesin-dextromethorphan (ROBITUSSIN DM) 100-10 MG/5ML syrup 15 mL  15 mL Oral Q4H PRN Schnier, Latina Craver, MD       magnesium citrate solution 1 Bottle  1 Bottle Oral Once PRN Schnier, Latina Craver, MD       metoprolol succinate (TOPROL-XL) 24 hr tablet 50 mg  50 mg Oral Daily O'Neal, Ronnald Ramp, MD       metoprolol tartrate (LOPRESSOR) injection 2-5 mg  2-5 mg Intravenous Q2H PRN Schnier, Latina Craver, MD       mometasone-formoterol (DULERA) 200-5 MCG/ACT inhaler 2 puff  2 puff Inhalation BID Schnier, Latina Craver, MD   2 puff at 06/20/22 1950   morphine (PF) 2 MG/ML injection 2 mg  2 mg Intravenous Q3H PRN Elease Etienne, MD   2 mg at 06/19/22 0943   nitroGLYCERIN (NITROSTAT) SL tablet 0.4 mg  0.4 mg Sublingual Q5 min PRN Schnier, Latina Craver, MD   0.4 mg at 06/14/22 1456   Oral care mouth rinse  15 mL Mouth Rinse PRN Hongalgi, Anand D, MD       oxyCODONE (Oxy IR/ROXICODONE) immediate release tablet 5 mg  5 mg Oral Q6H PRN Elease Etienne, MD   5 mg at 06/20/22 1949   pantoprazole (PROTONIX) injection 40 mg  40 mg Intravenous Q12H Schnier, Latina Craver, MD   40 mg at 06/20/22 2204   phenol (CHLORASEPTIC) mouth spray 1 spray  1 spray Mouth/Throat PRN Schnier, Latina Craver, MD       polyethylene glycol (MIRALAX / GLYCOLAX) packet 17 g  17 g Oral Daily PRN Schnier, Latina Craver, MD       potassium chloride (KLOR-CON) packet 40 mEq  40 mEq Oral Once Tiombe Tomeo, DO       QUEtiapine (SEROQUEL) tablet 25 mg  25 mg Oral QHS PRN Schnier, Latina Craver, MD   25 mg at 06/20/22 2203   sacubitril-valsartan (ENTRESTO) 24-26 mg per tablet  1 tablet Oral BID Sande Rives, MD   1 tablet at 06/20/22 2204   spironolactone (ALDACTONE) tablet 25 mg  25 mg Oral Daily Schnier, Latina Craver, MD   25 mg at 06/20/22 0840   thiamine (VITAMIN B1) tablet 100 mg  100 mg Oral Daily Schnier, Latina Craver, MD    100 mg at 06/20/22 0840   varenicline (CHANTIX) tablet 1 mg  1 mg Oral Daily Schnier, Latina Craver, MD   1 mg at 06/20/22 0841    No Known Allergies    Social History   Socioeconomic History   Marital status: Single  Spouse name: Not on file   Number of children: Not on file   Years of education: Not on file   Highest education level: Not on file  Occupational History   Occupation: retired  Tobacco Use   Smoking status: Some Days    Packs/day: 0.25    Years: 25.00    Total pack years: 6.25    Types: Pipe, Cigarettes   Smokeless tobacco: Never   Tobacco comments:    pipe tobacco   Vaping Use   Vaping Use: Never used  Substance and Sexual Activity   Alcohol use: Yes    Comment: Socially - once a month   Drug use: No   Sexual activity: Not Currently  Other Topics Concern   Not on file  Social History Narrative   Not on file   Social Determinants of Health   Financial Resource Strain: Low Risk  (07/11/2020)   Overall Financial Resource Strain (CARDIA)    Difficulty of Paying Living Expenses: Not hard at all  Food Insecurity: No Food Insecurity (06/15/2022)   Hunger Vital Sign    Worried About Running Out of Food in the Last Year: Never true    [place] in the Last Year: Never true  Transportation Needs: No Transportation Needs (06/15/2022)   PRAPARE - Hydrologist (Medical): No    Lack of Transportation (Non-Medical): No  Physical Activity: Insufficiently Active (07/11/2020)   Exercise Vital Sign    Days of Exercise per Week: 2 days    Minutes of Exercise per Session: 20 min  Stress: No Stress Concern Present (07/11/2020)   [place]    Feeling of Stress : Not at all  Social Connections: Not on file  Intimate Partner Violence: Not At Risk (06/15/2022)   Humiliation, Afraid, Rape, and Kick questionnaire    Fear of Current or Ex-Partner: No    Emotionally Abused: No     Physically Abused: No    Sexually Abused: No      Family History  Problem Relation Age of Onset   Diabetes Mother    Diabetes Father    Heart disease Brother    Heart attack Brother    Diabetes Brother    Kidney failure Brother    Thyroid disease Neg Hx     PHYSICAL EXAM: Vitals:   06/21/22 0357 06/21/22 0404  BP: 95/76   Pulse: 96   Resp: 20 (!) 21  Temp: 98.6 F (37 C)   SpO2: 98%    GENERAL: Well nourished, well developed, and in no apparent distress at rest.  HEENT: Negative for arcus senilis or xanthelasma. There is no scleral icterus.  The mucous membranes are pink and moist.   NECK: Supple, No masses. Normal carotid upstrokes without bruits. No masses or thyromegaly.    CHEST: There are no chest wall deformities. There is no chest wall tenderness. Respirations are unlabored.  Lungs- cta b/l CARDIAC:  JVP: 7 cm H2O         Normal S1, S2  tachycardic, irregular. No murmurs, rubs or gallops.  Pulses are 2+ and symmetrical in upper and lower extremities. no edema.  ABDOMEN: Soft, non-tender, non-distended. There are no masses or hepatomegaly. There are normal bowel sounds.  EXTREMITIES: Warm and well perfused with no cyanosis, clubbing.  LYMPHATIC: No axillary or supraclavicular lymphadenopathy.  NEUROLOGIC: Patient is oriented x3 with no focal or lateralizing neurologic deficits.  PSYCH: Patients  affect is appropriate, there is no evidence of anxiety or depression.  SKIN: Warm and dry; no lesions or wounds.   DATA REVIEW  QMV:HQIO w/ RVR as per my read  ECHO: 07/2016: LVEF 25%-30% 01/2017: LVEF 30-35% 07/2017: LVEF 30-35% 10/22: LVEF 30% 06/16/22: LVEF 20-25% with severely reduced RV function  CATH: RHC/LHC:  1. No significant coronary artery disease. 2. Severely reduced LV systolic function by echo. Left ventricular angiography was not performed.  3. Right heart catheterization showed minimally elevated filling pressures and pulmonary hypertension. Moderately  reduced cardiac output. RA pressure: 12 mmHg, RV pressure: 40 over 6 mmHg, PA pressure 37/12 with a mean of 12 mmHg. Pulmonary capillary wedge pressure was 12. Cardiac output was 4.16 with a cardiac index of 1.77.   ASSESSMENT & PLAN:  Heart failure with reduced EF Etiology of NG:EXBMWUXLKGM cardiomyopathy by LHC in 2018; has has had persistent atrial fibrillation since 2018 which is possibly the etiology of his HFrEF; can consider CMR at a later date.  NYHA class / AHA Stage:NYHA III Volume status & Diuretics: Euvolemic on exam today, continue lasix 40mg  PO daily Vasodilators:Entresto 24/26mg  BID, BP at goal. Will continue.  Beta-Blocker:toprol 50mg  daily; starting digoxin today (271mcg x 2, follow with 150mcg daily) WNU:[id number] 25mg  daily Cardiometabolic:jardiance 10mg  Devices therapies & Valvulopathies:HFrEF since 2018 with LVEF persistently below 30%. Will need EP consult for outpatient ICD evaluation at discharge.  Advanced therapies:Not a candidate currently; I think there are significant hurdles with psychosocial barriers that need to be overcome initially.   2. Atrial fibrillation with RVR - Rates 130s-160s today with minimal exertion - Digoxin 284mcg x 2; start 16mcg tomorrow for heart failure & rate control - Amiodarone IV load starting today; patient's cardiomyopathy is likely secondary to persistent atrial fibrillation. Will load with amio with plan for TEE/DCCV tomorrow.  Patient is agreeable to moving forward with this plan.   3. 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    4. HTN - Well controlled on above regiment.  5. T2DM - Followed by internal medicine team - Jardiance also started.    Doreather Hoxworth Advanced Heart Failure Mechanical Circulatory Support

## 2022-06-22 DIAGNOSIS — I4891 Unspecified atrial fibrillation: Secondary | ICD-10-CM | POA: Diagnosis not present

## 2022-06-22 DIAGNOSIS — I5021 Acute systolic (congestive) heart failure: Secondary | ICD-10-CM | POA: Diagnosis not present

## 2022-06-22 LAB — BASIC METABOLIC PANEL
Anion gap: 8 (ref 5–15)
BUN: 13 mg/dL (ref 8–23)
CO2: 25 mmol/L (ref 22–32)
Calcium: 8.3 mg/dL — ABNORMAL LOW (ref 8.9–10.3)
Chloride: 100 mmol/L (ref 98–111)
Creatinine, Ser: 0.81 mg/dL (ref 0.61–1.24)
GFR, Estimated: >60 mL/min (ref >60)
Glucose, Bld: 123 mg/dL — ABNORMAL HIGH (ref 70–99)
Potassium: 3.7 mmol/L (ref 3.5–5.1)
Sodium: 133 mmol/L — ABNORMAL LOW (ref 135–145)

## 2022-06-22 LAB — PROTIME-INR
INR: 1.2 (ref 0.8–1.2)
Prothrombin Time: 14.6 seconds (ref 11.4–15.2)

## 2022-06-22 LAB — MAGNESIUM: Magnesium: 2.2 mg/dL (ref 1.7–2.4)

## 2022-06-22 MED ORDER — SODIUM CHLORIDE 0.9 % IV SOLN: INTRAVENOUS | Status: DC

## 2022-06-22 MED ORDER — POTASSIUM CHLORIDE CRYS ER 20 MEQ PO TBCR
40.0000 meq | EXTENDED_RELEASE_TABLET | Freq: Once | ORAL | Status: AC
  Administered 2022-06-22: 40 meq via ORAL
  Filled 2022-06-22: qty 2

## 2022-06-22 NOTE — H&P (View-Only) (Signed)
ADVANCED HEART FAILURE CONSULT NOTE  Referring Physician: No ref. provider found  Primary Care: Valerie Roys, DO Primary Cardiologist: Dr. Fletcher Anon  Interval hx/Subjective: - No complaints this AM; laying flat in bed w/o difficulty.  - No SOB; chest pain from yesterday has resolved.  - On amio gtt now in rate controlled afib (rates 80s-100)   PHYSICAL EXAM: Vitals:   06/22/22 0342 06/22/22 0824  BP:  (!) 113/93  Pulse:  78  Resp: (!) 21 (!) 22  Temp:  98.2 F (36.8 C)  SpO2:  99%   GENERAL: Well nourished, well developed, and in no apparent distress at rest.  HEENT: Negative for arcus senilis or xanthelasma. There is no scleral icterus.  The mucous membranes are pink and moist.   NECK: Supple, No masses. Normal carotid upstrokes without bruits. No masses or thyromegaly.    CHEST: There are no chest wall deformities. There is no chest wall tenderness. Respirations are unlabored.  Lungs- cta b/l CARDIAC:  JVP: 8cm         Normal S1, S2  tachycardic, irregular. No murmurs, rubs or gallops.  Pulses are 2+ and symmetrical in upper and lower extremities. no edema.  ABDOMEN: Soft, non-tender, non-distended. There are no masses or hepatomegaly. There are normal bowel sounds.  EXTREMITIES: Warm and well perfused with no cyanosis, clubbing.  LYMPHATIC: No axillary or supraclavicular lymphadenopathy.  NEUROLOGIC: Patient is oriented x3 with no focal or lateralizing neurologic deficits.  PSYCH: Patients affect is appropriate, there is no evidence of anxiety or depression.  SKIN: Warm and dry; no lesions or wounds.   DATA REVIEW  XBJ:YNWG w/ RVR as per my read  ECHO: 07/2016: LVEF 25%-30% 01/2017: LVEF 30-35% 07/2017: LVEF 30-35% 10/22: LVEF 30% 06/16/22: LVEF 20-25% with severely reduced RV function  CATH: RHC/LHC:  1. No significant coronary artery disease. 2. Severely reduced LV systolic function by echo. Left ventricular angiography was not performed.  3. Right heart  catheterization showed minimally elevated filling pressures and pulmonary hypertension. Moderately reduced cardiac output. RA pressure: 12 mmHg, RV pressure: 40 over 6 mmHg, PA pressure 37/12 with a mean of 12 mmHg. Pulmonary capillary wedge pressure was 12. Cardiac output was 4.16 with a cardiac index of 1.77.   ASSESSMENT & PLAN:  Heart failure with reduced EF Etiology of NF:AOZHYQMVHQI cardiomyopathy by LHC in 2018; has has had persistent atrial fibrillation since 2018 which is possibly the etiology of his HFrEF; can consider CMR at a later date.  NYHA class / AHA Stage:NYHA III Volume status & Diuretics: Euvolemic on exam today, continue lasix 40mg  PO daily Vasodilators:Entresto 24/26mg  BID, BP at goal. Will continue.  Beta-Blocker:toprol 50mg  daily; will continue digoxin 19mcg tomorrow.  ONG:EXBMWUXLKGMWNU 25mg  daily Cardiometabolic:jardiance 10mg  Devices therapies & Valvulopathies:HFrEF since 2018 with LVEF persistently below 30%. Will need EP consult for outpatient ICD evaluation at discharge.  Advanced therapies:Not a candidate currently; I think there are significant hurdles with psychosocial barriers that need to be overcome initially.   2. Atrial fibrillation with RVR - Amio gtt yesterday with digoxin 222mcg; improvement in rates from 160s/170s to 80s this AM, however, remains in atrial fibrillation.  - TEE/DCCV tomorrow AM.   3. 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    4. HTN - Well controlled on above regiment.  5. T2DM - Followed by internal medicine team - Jardiance also started.  [place] [employer] Advanced Heart Failure Mechanical Circulatory Support

## 2022-06-22 NOTE — Progress Notes (Signed)
 ADVANCED HEART FAILURE CONSULT NOTE  Referring Physician: No ref. provider found  Primary Care: Lampson, Megan P, DO Primary Cardiologist: Dr. Arida  Interval hx/Subjective: - No complaints this AM; laying flat in bed w/o difficulty.  - No SOB; chest pain from yesterday has resolved.  - On amio gtt now in rate controlled afib (rates 80s-100)   PHYSICAL EXAM: Vitals:   06/22/22 0342 06/22/22 0824  BP:  (!) 113/93  Pulse:  78  Resp: (!) 21 (!) 22  Temp:  98.2 F (36.8 C)  SpO2:  99%   GENERAL: Well nourished, well developed, and in no apparent distress at rest.  HEENT: Negative for arcus senilis or xanthelasma. There is no scleral icterus.  The mucous membranes are pink and moist.   NECK: Supple, No masses. Normal carotid upstrokes without bruits. No masses or thyromegaly.    CHEST: There are no chest wall deformities. There is no chest wall tenderness. Respirations are unlabored.  Lungs- cta b/l CARDIAC:  JVP: 8cm         Normal S1, S2  tachycardic, irregular. No murmurs, rubs or gallops.  Pulses are 2+ and symmetrical in upper and lower extremities. no edema.  ABDOMEN: Soft, non-tender, non-distended. There are no masses or hepatomegaly. There are normal bowel sounds.  EXTREMITIES: Warm and well perfused with no cyanosis, clubbing.  LYMPHATIC: No axillary or supraclavicular lymphadenopathy.  NEUROLOGIC: Patient is oriented x3 with no focal or lateralizing neurologic deficits.  PSYCH: Patients affect is appropriate, there is no evidence of anxiety or depression.  SKIN: Warm and dry; no lesions or wounds.   DATA REVIEW  ECG:afib w/ RVR as per my read  ECHO: 07/2016: LVEF 25%-30% 01/2017: LVEF 30-35% 07/2017: LVEF 30-35% 10/22: LVEF 30% 06/16/22: LVEF 20-25% with severely reduced RV function  CATH: RHC/LHC:  1. No significant coronary artery disease. 2. Severely reduced LV systolic function by echo. Left ventricular angiography was not performed.  3. Right heart  catheterization showed minimally elevated filling pressures and pulmonary hypertension. Moderately reduced cardiac output. RA pressure: 12 mmHg, RV pressure: 40 over 6 mmHg, PA pressure 37/12 with a mean of 12 mmHg. Pulmonary capillary wedge pressure was 12. Cardiac output was 4.16 with a cardiac index of 1.77.   ASSESSMENT & PLAN:  Heart failure with reduced EF Etiology of HF:Nonischemic cardiomyopathy by LHC in 2018; has has had persistent atrial fibrillation since 2018 which is possibly the etiology of his HFrEF; can consider CMR at a later date.  NYHA class / AHA Stage:NYHA III Volume status & Diuretics: Euvolemic on exam today, continue lasix 40mg PO daily Vasodilators:Entresto 24/26mg BID, BP at goal. Will continue.  Beta-Blocker:toprol 50mg daily; will continue digoxin 125mcg tomorrow.  MRA:spironolactone 25mg daily Cardiometabolic:jardiance 10mg Devices therapies & Valvulopathies:HFrEF since 2018 with LVEF persistently below 30%. Will need EP consult for outpatient ICD evaluation at discharge.  Advanced therapies:Not a candidate currently; I think there are significant hurdles with psychosocial barriers that need to be overcome initially.   2. Atrial fibrillation with RVR - Amio gtt yesterday with digoxin 250mcg; improvement in rates from 160s/170s to 80s this AM, however, remains in atrial fibrillation.  - TEE/DCCV tomorrow AM.   3. 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    4. HTN - Well controlled on above regiment.  5. T2DM - Followed by internal medicine team - Jardiance also started.      Bryssa Tones Advanced Heart Failure Mechanical Circulatory Support

## 2022-06-22 NOTE — Progress Notes (Signed)
Nutrition Brief Note  Patient identified on the Malnutrition Screening Tool (MST) Report  Wt Readings from Last 15 Encounters:  06/22/22 117.6 kg  05/24/22 124.7 kg  10/02/21 122.5 kg  06/18/21 127.9 kg  11/17/20 130.6 kg  11/17/20 130.6 kg  10/30/20 130.7 kg  07/11/20 117.9 kg  03/28/20 117.9 kg  08/24/19 117.9 kg  12/11/18 127.1 kg  05/16/18 117.5 kg  05/08/18 121.7 kg  04/11/18 117 kg  03/13/18 127 kg   Pt with hypertension, hyperlipidemia, non-insulin-dependent diabetes mellitus, who presents for chief concerns of chest pain and shortness of breath.   Pt admitted with CHF and a-fib with RVR.   Per cardiology notes, plan for cardioversion tomorrow.   Reviewed I/O's: -1.8 L x 24 hours and -15.4 L since admission  UOP: 2.5 L x 24 hours   Spoke with pt at bedside, who was pleasant and in good spirits at time of visit. Pt initially upset that he was unable to get cardioversion today; RD actively listened pt to concerns and validated feelings. Pt eager to have cardioversion so he can go home.   Pt reports good appetite, consumed 100% of his breakfast today. Pt reports good appetite PTA; he explains that he lives with his daughter who grocery shops with him. Per pt, he has been eating a lot of scrambled eggs and TV dinners.   Pt denies any weight loss. He reports "I think I gained weight from laying here". Noted wt loss likely related to diuresis.   Pt with no nutrition-related questions or concerns, but expressed appreciation for visit.   Nutrition-Focused physical exam completed. Findings are no fat depletion, no muscle depletion, and mild edema.    Body mass index is 35.16 kg/m. Patient meets criteria for obesity, class II based on current BMI. Obesity is a complex, chronic medical condition that is optimally managed by a multidisciplinary care team. Weight loss is not an ideal goal for an acute inpatient hospitalization. However, if further work-up for obesity is warranted,  consider outpatient referral to outpatient bariatric service and/or [place].    Current diet order is regular, patient is consuming approximately 100% of meals at this time. Labs and medications reviewed.   No nutrition interventions warranted at this time. If nutrition issues arise, please consult RD.   Loistine Chance, RD, LDN, [place] Registered Dietitian II Certified Diabetes Care and Education Specialist Please refer to [facility] for RD and/or RD on-call/weekend/after hours pager

## 2022-06-22 NOTE — TOC Progression Note (Signed)
Transition of Care [facility]) - Progression Note    Patient Details  Name: Eugene Henderson MRN: [id number] Date of Birth: 07-13-1950  Transition of Care [facility]) CM/SW Contact  Laurena Slimmer, RN Phone Number: 06/22/2022, 12:46 PM  Clinical Narrative:    Case reviewed for new needs or changes in disposition. Will request recommended DME at discharge per MD request.         Expected Discharge Plan and Services                                               Social Determinants of Health (SDOH) Interventions SDOH Screenings   Food Insecurity: No Food Insecurity (06/15/2022)  Housing: Low Risk  (06/15/2022)  Transportation Needs: No Transportation Needs (06/15/2022)  Utilities: Not At Risk (06/15/2022)  Depression (PHQ2-9): High Risk (06/18/2021)  Financial Resource Strain: Low Risk  (07/11/2020)  Physical Activity: Insufficiently Active (07/11/2020)  Stress: No Stress Concern Present (07/11/2020)  Tobacco Use: High Risk (06/18/2022)    Readmission Risk Interventions    06/16/2022    3:33 PM  Readmission Risk Prevention Plan  Transportation Screening Complete  HRI or [place] Complete  Social Work Consult for [place] Planning/Counseling Complete  Palliative Care Screening Not Applicable  Medication Review Press photographer) Complete

## 2022-06-22 NOTE — Progress Notes (Signed)
PT Cancellation Note  Patient Details Name: Abijah Roussel MRN: [id number] DOB: June 07, 1951   Cancelled Treatment:    Reason Eval/Treat Not Completed: Patient not medically ready;Patient declined, no reason specified (2 attempts made to see patient- earlier in day, pt in RVR with rates 110s-140s awaiting medication. Later in afternoon pt asks to finish his TV show, reports he has been AMB to [facility] multiple times today. Will continue to follow.)  7:52 AM, 06/22/22 Etta Grandchild, PT, DPT Physical Therapist - [facility]  [phone] ([place])     Karson Chicas C 06/22/2022, 7:51 AM

## 2022-06-22 NOTE — Progress Notes (Signed)
PT Cancellation Note  Patient Details Name: Eugene Henderson MRN: [id number] DOB: 07-01-1950   Cancelled Treatment:    Reason Eval/Treat Not Completed: Other (comment) (Chart reviewed, HR better controlled today, 90s while resting, no RVR noted. Pt reports being NPO this morning anticipating procedure later. He is tired and resting, would like to defer therapy to later. Reports confidence and good tolerance to in-room AMB previous day).   9:11 AM, 06/22/22 Etta Grandchild, PT, DPT Physical Therapist - [facility]  [phone] ([place])    Krystalynn Ridgeway C 06/22/2022, 9:11 AM

## 2022-06-22 NOTE — Progress Notes (Signed)
PROGRESS NOTE   Eugene Henderson  OFB:[id number]    DOB: [birth year]    DOA: 06/14/2022  PCP: Dorcas Carrow, DO   I have briefly reviewed patients previous medical records in [facility]  Chief Complaint  Patient presents with   Chest Pain   Shortness of Breath    Brief Narrative:  72 year old male with PMH of HTN, HLD, type II DM, tobacco abuse, presented to the ED on 06/14/2022 with chest pain and dyspnea x 3 days.  He was initially admitted with diagnosis of abdominal pain.  No choledocholithiasis by imaging and GI signed off.  Cardiology consulted for perioperative CV risk assessment in anticipation of AAA and iliac aneurysm repairs, acute on chronic systolic CHF and A-fib.  Hospital course complicated by delirium.  He then underwent endovascular procedures for his AAA and iliac artery aneurysm on 1/11, postprocedure was admitted to ICU.  Slowly improving.  Transfer orders to telemetry bed on 1/12.  Slowly improving.  1/15: A-fib with RVR, advanced HF cardiologist consulting, started IV amiodarone with plan for TEE/DCCV postponed to 1/17.   Assessment & Plan:  Principal Problem:   Abdominal pain Active Problems:   Persistent atrial fibrillation (HCC)   Essential hypertension   Tobacco use   Chest pain   Diabetes mellitus without complication (HCC)   Morbid obesity (HCC)   Acute systolic heart failure (HCC)   AF (paroxysmal atrial fibrillation) (HCC)   Hyperlipidemia   Metal foreign body in head   Elevated bilirubin   Atrial fibrillation with RVR (HCC)   Thoracoabdominal aortic aneurysm (TAAA) (HCC)   HFrEF (heart failure with reduced ejection fraction) (HCC)   NICM (nonischemic cardiomyopathy) (HCC)   Pre-operative cardiovascular examination   Abdominal pain: This was his admitting presentation.  Negative workup for choledocholithiasis. GI recommended outpatient follow-up for cirrhosis, twice daily Protonix x 8 to 12 weeks, avoiding NSAIDs, no EGD was recommended and GI  signed off.  Overall etiology unclear but wide DD: Gastritis, PUD, passive hepatic congestion from decompensated CHF, AAA etc..  Abdominal pain has resolved.  Bowel regimen initiated yesterday for constipation.  Acute respiratory distress Present on admission.  Suspected due to decompensated CHF.  Could have underlying OSA/OHS based on body habitus.  This will have to be evaluated as outpatient.  Incentive spirometry.  Upon careful review of chart since admission, no clear hypoxia documented despite patient having dyspnea and tachypnea.  Thereby acute respiratory failure with hypoxia ruled out.  On room air.  Acute on chronic systolic CHF/nonischemic cardiomyopathy: 2D echo 2022: LVEF 30%.  Repeat echo this admission: LVEF 20-25%.  Prior left heart cath with no CAD.  Treated with IV Lasix 40 mg twice daily for a couple of days.  15.9 L thus far and - 1.8 L in the last 24 hours.  Cardiology follow-up appreciated.  Following medication changes done by them: Lasix changed to 40 mg p.o. daily, ARB stopped, started Entresto 24-26 mg twice daily, increased Toprol-XL to 50 Mg daily, continuing Aldactone 25 Mg daily, started Jardiance 10 Mg daily, continuing Eliquis 5 Mg twice daily along with aspirin 81 Mg daily.  Clinically euvolemic.  Chest x-ray without acute findings.  In their follow-up note today, etiology indicates starting digoxin 125 mcg tomorrow.  Hypokalemia: Potassium 3.7, continue to replace to try and keep >4.  Persistent A-fib with RVR: Was on IV heparin post endovascular procedures. Was not on anticoagulation PTA.  Cardiology have changed metoprolol to succinate 50 Mg daily and can be  titrated up as needed.  Continue Eliquis 5 Mg twice daily, aspirin 81 Mg daily.  1/15, A-fib with RVR up to 170s.  Cardiology initiated digoxin load, IV amiodarone with plan for TEE/DCCV now postponed to 1/17.  Rate controlled on amiodarone drip.  Digoxin 125 mcg from tomorrow.  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 Abdomen CTA revealed several abnormalities 1/8 (see for detailed report).  Following Cardiology preop clearance, patient underwent endovascular treatment of AAA and iliac artery aneurysms by vascular surgery on 1/11. Patient will need follow up with CT surgery after discharge for thoracic aortic aneurysm monitoring and management.    Delirium Resolved.   Essential hypertension Reasonably controlled.  As noted above, irbesartan discontinued, starting Entresto, changed to Toprol-XL and continuing spironolactone 25 Mg daily.     Metal foreign body in head non acute issue.  Outpatient follow-up.   Hyperlipidemia Continued statin.  Thrombocytopenia: Resolved.  Mild transaminitis: Unclear etiology.  Resolved.  Isolated hyperbilirubinemia, stable, unclear etiology.   Body mass index is 35.16 kg/m./Morbid obesity This meets criteria for morbid obesity based on the presence of 1 or more chronic comorbidities. Patient has hypertension. This complicates overall care and prognosis.    DVT prophylaxis: Place TED hose Start: 06/14/22 1835.  Eliquis.   Code Status: Full Code:  Family Communication: None at bedside.. Disposition:  Now on IV amiodarone drip with plans for TEE/DCCV 1/17.     Consultants:   Gastroenterology Cardiology Vascular surgery  Procedures:   As noted above  Antimicrobials:      Subjective:  Overall feels much better compared to yesterday.  No chest pain, dyspnea or palpitations.  No BM since yesterday.  Diet has been resumed and states that procedure/DCCV has been postponed.  Objective:   Vitals:   06/22/22 0342 06/22/22 0500 06/22/22 0824 06/22/22 1159  BP:   (!) 113/93 116/87  Pulse:   78 88  Resp: (!) 21  (!) 22 18  Temp:   98.2 F (36.8 C) 98.1 F (36.7 C)  TempSrc:   Oral Oral  SpO2:   99% 99%  Weight:  117.6 kg    Height:        General exam:  Elderly male, moderately built and obese sitting up comfortably in bed without distress.  Appears to be in good spirits. Respiratory system: Clear to auscultation.  No increased work of breathing. Cardiovascular system: S1 and S2 heard, irregularly irregular and tachycardic.  No JVD, murmurs or pedal edema.  Telemetry personally reviewed: A-fib with controlled ventricular rate. Gastrointestinal system: Abdomen is nondistended, soft and nontender. No organomegaly or masses felt. Normal bowel sounds heard.  Bilateral groin endovascular interventions sites reexamined today and without acute findings. GU: Foley catheter has been discontinued.  Voiding spontaneously. Central nervous system: Alert and oriented. No focal neurological deficits. Extremities: Symmetric 5 x 5 power. Skin: No rashes, lesions or ulcers Psychiatry: Judgement and insight appear somewhat impaired. Mood & affect appropriate. 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, chronic    Data Reviewed:   I have personally reviewed following labs and imaging studies   CBC: Recent Labs  Lab 06/19/22 0415 06/20/22 0343 06/21/22 0436  WBC 7.4 6.6 6.1  HGB 11.7* 12.0* 12.9*  HCT 36.3* 36.9* 38.9*  MCV 90.8 90.0 88.6  PLT 142* 137* 562    Basic Metabolic Panel: Recent Labs  Lab 06/18/22 0459 06/19/22 0415 06/20/22 0343 06/21/22  0436 06/22/22 0610  NA 136 131* 133* 134* 133*  K 4.1 3.4* 3.3* 3.7 3.7  CL 102 94* 93* 98 100  CO2 26 29 31 25 25   GLUCOSE 146* 114* 88 92 123*  BUN 17 10 10 12 13   CREATININE 0.81 0.70 0.72 0.71 0.81  CALCIUM 8.0* 8.0* 8.2* 8.2* 8.3*  MG  --   --  1.9  --  2.2    Liver Function Tests: Recent Labs  Lab 06/17/22 0217 06/18/22 0459 06/19/22 0415 06/20/22 0343 06/21/22 0436  AST 197* 96* 53* 32 20  ALT 164* 137* 95* 68* 50*  ALKPHOS 69 61 53 52 54  BILITOT 1.7* 1.1 1.9* 1.7* 1.7*  PROT 7.1 6.7 6.5 6.5 6.8  ALBUMIN 3.8 3.6 3.3* 3.2* 3.2*    CBG: Recent Labs  Lab  06/17/22 2316 06/18/22 0307 06/18/22 0714  GLUCAP 149* 136* 159*    Microbiology Studies:   Recent Results (from the past 240 hour(s))  Resp panel by RT-PCR (RSV, Flu A&B, Covid) Anterior Nasal Swab     Status: None   Collection Time: 06/14/22  2:16 PM   Specimen: Anterior Nasal Swab  Result Value Ref Range Status   SARS Coronavirus 2 by RT PCR NEGATIVE NEGATIVE Final    Comment: (NOTE) SARS-CoV-2 target nucleic acids are NOT DETECTED.  The SARS-CoV-2 RNA is generally detectable in upper respiratory specimens during the acute phase of infection. The lowest concentration of SARS-CoV-2 viral copies this assay can detect is 138 copies/mL. A negative result does not preclude SARS-Cov-2 infection and should not be used as the sole basis for treatment or other patient management decisions. A negative result may occur with  improper specimen collection/handling, submission of specimen other than nasopharyngeal swab, presence of viral mutation(s) within the areas targeted by this assay, and inadequate number of viral copies(<138 copies/mL). A negative result must be combined with clinical observations, patient history, and epidemiological information. The expected result is Negative.  Fact Sheet for Patients:  08/17/22  Fact Sheet for Healthcare Providers:  08/13/22  This test is no t yet approved or cleared by the [employer] FDA and  has been authorized for detection and/or diagnosis of SARS-CoV-2 by FDA under an Emergency Use Authorization (EUA). This EUA will remain  in effect (meaning this test can be used) for the duration of the COVID-19 declaration under Section 564(b)(1) of the Act, 21 U.S.C.section 360bbb-3(b)(1), unless the authorization is terminated  or revoked sooner.       Influenza A by PCR NEGATIVE NEGATIVE Final   Influenza B by PCR NEGATIVE NEGATIVE Final    Comment: (NOTE) The Xpert  Xpress SARS-CoV-2/FLU/RSV plus assay is intended as an aid in the diagnosis of influenza from Nasopharyngeal swab specimens and should not be used as a sole basis for treatment. Nasal washings and aspirates are unacceptable for Xpert Xpress SARS-CoV-2/FLU/RSV testing.  Fact Sheet for Patients: [employer]  Fact Sheet for Healthcare Providers: [place]  This test is not yet approved or cleared by the [employer] FDA and has been authorized for detection and/or diagnosis of SARS-CoV-2 by FDA under an Emergency Use Authorization (EUA). This EUA will remain in effect (meaning this test can be used) for the duration of the COVID-19 declaration under Section 564(b)(1) of the Act, 21 U.S.C. section 360bbb-3(b)(1), unless the authorization is terminated or revoked.     Resp Syncytial Virus by PCR NEGATIVE NEGATIVE Final    Comment: (NOTE) Fact Sheet for Patients: SeriousBroker.it  Fact  Sheet for Healthcare Providers: [employer]ker[employer]it  This test is not yet approved or cleared by the [place] and has been authorized for detection and/or diagnosis of SARS-CoV-2 by FDA under an Emergency Use Authorization (EUA). This EUA will remain in effect (meaning this test can be used) for the duration of the COVID-19 declaration under Section 564(b)(1) of the Act, 21 U.S.C. section 360bbb-3(b)(1), unless the authorization is terminated or revoked.  Performed at [facility], [address] [place], [place] [address]   Culture, blood (Routine X 2) w Reflex to ID Panel     Status: None   Collection Time: 06/14/22  6:56 PM   Specimen: BLOOD RIGHT ARM  Result Value Ref Range Status   Specimen Description BLOOD RIGHT ARM  Final   Special Requests   Final    BOTTLES DRAWN AEROBIC AND ANAEROBIC Blood Culture adequate volume   Culture   Final    NO GROWTH 5  DAYS Performed at [facility], [address], [place], [place] [address]    Report Status 06/19/2022 FINAL  Final  Culture, blood (Routine X 2) w Reflex to ID Panel     Status: None   Collection Time: 06/14/22  6:56 PM   Specimen: BLOOD  Result Value Ref Range Status   Specimen Description BLOOD RIGHT HAND  Final   Special Requests   Final    BOTTLES DRAWN AEROBIC AND ANAEROBIC Blood Culture adequate volume   Culture   Final    NO GROWTH 5 DAYS Performed at [facility], [address] [place], [place] [address]    Report Status 06/19/2022 FINAL  Final  MRSA Next Gen by PCR, Nasal     Status: None   Collection Time: 06/17/22  6:21 PM   Specimen: Nasal Mucosa; Nasal Swab  Result Value Ref Range Status   MRSA by PCR Next Gen NOT DETECTED NOT DETECTED Final    Comment: (NOTE) The GeneXpert MRSA Assay (FDA approved for NASAL specimens only), is one component of a comprehensive MRSA colonization surveillance program. It is not intended to diagnose MRSA infection nor to guide or monitor treatment for MRSA infections. Test performance is not FDA approved in patients less than 52 years old. Performed at [facility], [address], [place], [place] [address]     Radiology Studies:  [facility] Chest Fall Branch 1 View  Result Date: 06/21/2022 CLINICAL DATA:  Shortness of breath. EXAM: PORTABLE CHEST 1 VIEW COMPARISON:  06/14/2022 chest radiograph and CT chest. FINDINGS: Trachea is midline. Heart is enlarged. No airspace consolidation or pleural fluid. IMPRESSION: No acute findings. Electronically Signed   By: Leanna Battles M.D.   On: 06/21/2022 12:58    Scheduled Meds:    apixaban  5 mg Oral BID   aspirin EC  81 mg Oral Q0600   atorvastatin  40 mg Oral Daily   Chlorhexidine Gluconate Cloth  6 each Topical Q0600   empagliflozin  10 mg Oral Daily   furosemide  40 mg Oral Daily   metoprolol succinate  50 mg Oral Daily   mometasone-formoterol  2 puff Inhalation  BID   pantoprazole (PROTONIX) IV  40 mg Intravenous Q12H   polyethylene glycol  17 g Oral Daily   sacubitril-valsartan  1 tablet Oral BID   senna-docusate  1 tablet Oral BID   spironolactone  25 mg Oral Daily   thiamine  100 mg Oral Daily   varenicline  1 mg Oral Daily  Continuous Infusions:    sodium chloride Stopped (06/15/22 0717)   sodium chloride     amiodarone 30 mg/hr (06/22/22 0517)     LOS: 8 days     Vernell Leep, MD,  FACP, [facility], [facility], [facility], [place]     To contact the attending provider between 7A-7P or the covering provider during after hours 7P-7A, please log into the web site [email or web address] and access using universal  password for that web site. If you do not have the password, please call the hospital operator.  06/22/2022, 2:32 PM

## 2022-06-22 NOTE — Progress Notes (Signed)
Occupational Therapy Treatment Patient Details Name: Eugene Henderson MRN: [id number] DOB: 07/06/1950 Today's Date: 06/22/2022   History of present illness Eugene Henderson is a 9yoM who [facility]22 with CP and SOB. 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 In ED HR in 120-150s in RVR. BNP 575. Workup correlates pain symptoms with suspected cholecystitis. 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   OT comments  Upon entering session, pt supine in bed and agreeable to OT. Tx session targeted increasing activity tolerance for improved ADL completion. Pt completed functional mobility to the bathroom with Mod I using IV pole for support (endorsed feeling "unsteady", but deferred use of RW). Pt stood to complete toileting and then engaged in sinkside grooming tasks. Pt endorsing fatigue and requested seated rest break in recliner. Pt left sitting up in recliner with all needs in reach. Pt is making progress toward goal completion. D/C recommendation remains appropriate. OT will continue to follow acutely.     Recommendations for follow up therapy are one component of a multi-disciplinary discharge planning process, led by the attending physician.  Recommendations may be updated based on patient status, additional functional criteria and insurance authorization.    Follow Up Recommendations  No OT follow up     Assistance Recommended at Discharge PRN  Patient can return home with the following  Assistance with cooking/housework;Assist for transportation;Help with stairs or ramp for entrance   Equipment Recommendations  None recommended by OT    Recommendations for Other Services      Precautions / Restrictions Precautions Precautions: Fall Restrictions Weight Bearing Restrictions: No       Mobility Bed Mobility Overal bed mobility: Independent                  Transfers Overall  transfer level: Modified independent                 General transfer comment: STS from EOB     Balance Overall balance assessment: Modified Independent, No apparent balance deficits (not formally assessed)                                         ADL either performed or assessed with clinical judgement   ADL Overall ADL's : Needs assistance/impaired     Grooming: Standing;Wash/dry hands;Supervision/safety;Set up;Wash/dry face;Oral care                   Toilet Transfer: Regular Toilet;Grab bars;Min guard Toilet Transfer Details (indicate cue type and reason): completed toileting in standing, single UE supported, Min guard for safety 2/2 reports of feeling "unsteady" Toileting- Clothing Manipulation and Hygiene: Min guard;Sit to/from stand       Functional mobility during ADLs: Modified independent (to the bathroom, use of IV pole for support (reported feeling unsteady but deferred use of RW))      Extremity/Trunk Assessment Upper Extremity Assessment Upper Extremity Assessment: Overall WFL for tasks assessed   Lower Extremity Assessment Lower Extremity Assessment: Generalized weakness        Vision Baseline Vision/History: 1 Wears glasses (readers) Patient Visual Report: No change from baseline     Perception     Praxis      Cognition Arousal/Alertness: Awake/alert Behavior During Therapy: WFL for tasks assessed/performed Overall Cognitive Status: Within Functional Limits  for tasks assessed                                          Exercises      Shoulder Instructions       General Comments      Pertinent Vitals/ Pain       Pain Assessment Pain Assessment: No/denies pain  Home Living                                          Prior Functioning/Environment              Frequency  Min 2X/week        Progress Toward Goals  OT Goals(current goals can now be found in the care  plan section)  Progress towards OT goals: Progressing toward goals  Acute Rehab OT Goals Patient Stated Goal: return home, get stronger OT Goal Formulation: With patient Time For Goal Achievement: 07/04/22 Potential to Achieve Goals: Good  Plan Discharge plan remains appropriate;Frequency remains appropriate    Co-evaluation                 AM-PAC OT "6 Clicks" Daily Activity     Outcome Measure   Help from another person eating meals?: None Help from another person taking care of personal grooming?: None Help from another person toileting, which includes using toliet, bedpan, or urinal?: A Little Help from another person bathing (including washing, rinsing, drying)?: A Little Help from another person to put on and taking off regular upper body clothing?: None Help from another person to put on and taking off regular lower body clothing?: A Little 6 Click Score: 21    End of Session Equipment Utilized During Treatment: Gait belt  OT Visit Diagnosis: Other abnormalities of gait and mobility (R26.89);Muscle weakness (generalized) (M62.81)   Activity Tolerance Patient tolerated treatment well;Patient limited by fatigue   Patient Left with call bell/phone within reach;in chair;with chair alarm set   Nurse Communication Mobility status        Time: 9518-8416 OT Time Calculation (min): 18 min  Charges: OT General Charges $OT Visit: 1 Visit OT Treatments $Self Care/Home Management : 8-22 mins  [facility] MS, OTR/L ascom [phone]  06/22/22, 1:57 PM

## 2022-06-23 ENCOUNTER — Encounter
Admission: EM | Disposition: A | Discharge status: Home / Self Care | Payer: Self-pay | Source: Home / Self Care | Attending: Internal Medicine

## 2022-06-23 ENCOUNTER — Other Ambulatory Visit: Payer: Self-pay

## 2022-06-23 ENCOUNTER — Inpatient Hospital Stay (HOSPITAL_COMMUNITY)
Admit: 2022-06-23 | Discharge: 2022-06-23 | Disposition: A | Discharge status: Home / Self Care | Payer: Medicare HMO | Attending: Cardiology | Admitting: Cardiology

## 2022-06-23 ENCOUNTER — Inpatient Hospital Stay: Payer: Medicare HMO | Admitting: Certified Registered Nurse Anesthetist

## 2022-06-23 ENCOUNTER — Encounter: Payer: Self-pay | Admitting: Internal Medicine

## 2022-06-23 DIAGNOSIS — I4891 Unspecified atrial fibrillation: Secondary | ICD-10-CM

## 2022-06-23 DIAGNOSIS — I340 Nonrheumatic mitral (valve) insufficiency: Secondary | ICD-10-CM

## 2022-06-23 DIAGNOSIS — I509 Heart failure, unspecified: Secondary | ICD-10-CM

## 2022-06-23 HISTORY — PX: TEE WITHOUT CARDIOVERSION: SHX5443

## 2022-06-23 HISTORY — PX: CARDIOVERSION: SHX1299

## 2022-06-23 LAB — RENAL FUNCTION PANEL
Albumin: 3.1 g/dL — ABNORMAL LOW (ref 3.5–5.0)
Anion gap: 4 — ABNORMAL LOW (ref 5–15)
BUN: 11 mg/dL (ref 8–23)
CO2: 28 mmol/L (ref 22–32)
Calcium: 8.4 mg/dL — ABNORMAL LOW (ref 8.9–10.3)
Chloride: 102 mmol/L (ref 98–111)
Creatinine, Ser: 0.7 mg/dL (ref 0.61–1.24)
GFR, Estimated: >60 mL/min (ref >60)
Glucose, Bld: 105 mg/dL — ABNORMAL HIGH (ref 70–99)
Phosphorus: 3.9 mg/dL (ref 2.5–4.6)
Potassium: 3.9 mmol/L (ref 3.5–5.1)
Sodium: 134 mmol/L — ABNORMAL LOW (ref 135–145)

## 2022-06-23 LAB — TROPONIN I (HIGH SENSITIVITY): Troponin I (High Sensitivity): 14 ng/L (ref <18)

## 2022-06-23 SURGERY — ECHOCARDIOGRAM, TRANSESOPHAGEAL
Anesthesia: General

## 2022-06-23 MED ORDER — NITROGLYCERIN 0.4 MG SL SUBL
SUBLINGUAL_TABLET | SUBLINGUAL | Status: AC
  Filled 2022-06-23: qty 1

## 2022-06-23 MED ORDER — ALUM & MAG HYDROXIDE-SIMETH 200-200-20 MG/5ML PO SUSP
ORAL | Status: AC
  Administered 2022-06-23: 30 mL via ORAL
  Filled 2022-06-23: qty 30

## 2022-06-23 MED ORDER — BUTAMBEN-TETRACAINE-BENZOCAINE 2-2-14 % EX AERO
INHALATION_SPRAY | CUTANEOUS | Status: AC
  Administered 2022-06-23: 3
  Filled 2022-06-23: qty 5

## 2022-06-23 MED ORDER — LIDOCAINE VISCOUS HCL 2 % MT SOLN
OROMUCOSAL | Status: AC
  Administered 2022-06-23: 15 mL
  Filled 2022-06-23: qty 15

## 2022-06-23 MED ORDER — AMIODARONE IV BOLUS ONLY 150 MG/100ML
150.0000 mg | INTRAVENOUS | Status: AC
  Administered 2022-06-23: 150 mg via INTRAVENOUS
  Filled 2022-06-23: qty 100

## 2022-06-23 MED ORDER — PROPOFOL 10 MG/ML IV BOLUS
INTRAVENOUS | Status: AC
  Filled 2022-06-23: qty 20

## 2022-06-23 MED ORDER — PROPOFOL 10 MG/ML IV BOLUS
INTRAVENOUS | Status: DC | PRN
  Administered 2022-06-23: 20 mg via INTRAVENOUS
  Administered 2022-06-23: 30 mg via INTRAVENOUS
  Administered 2022-06-23 (×4): 20 mg via INTRAVENOUS
  Administered 2022-06-23: 10 mg via INTRAVENOUS

## 2022-06-23 MED ORDER — MIDAZOLAM HCL 2 MG/2ML IJ SOLN
INTRAMUSCULAR | Status: AC
  Filled 2022-06-23: qty 2

## 2022-06-23 MED ORDER — DIGOXIN 125 MCG PO TABS
0.0625 mg | ORAL_TABLET | Freq: Every day | ORAL | Status: DC
  Administered 2022-06-23 – 2022-06-26 (×4): 0.0625 mg via ORAL
  Filled 2022-06-23 (×5): qty 0.5

## 2022-06-23 MED ORDER — ALUM & MAG HYDROXIDE-SIMETH 200-200-20 MG/5ML PO SUSP: 30.0000 mL | Freq: Once | ORAL | Status: AC

## 2022-06-23 NOTE — Transfer of Care (Signed)
Immediate Anesthesia Transfer of Care Note  Patient: Eugene Henderson  Procedure(s) Performed: TRANSESOPHAGEAL ECHOCARDIOGRAM CARDIOVERSION  Patient Location:  procedures post op  Anesthesia Type:General  Level of Consciousness: drowsy  Airway & Oxygen Therapy: Patient Spontanous Breathing and Patient connected to nasal cannula oxygen  Post-op Assessment: Report given to RN and Post -op Vital signs reviewed and stable  Post vital signs: Reviewed and stable  Last Vitals:  Vitals Value Taken Time  BP 100/67 06/23/22 0808  Temp    Pulse 76 06/23/22 0808  Resp 27 06/23/22 0808  SpO2 98 % 06/23/22 0808    Last Pain:  Vitals:   06/23/22 0723  TempSrc: Oral  PainSc: 0-No pain      Patients Stated Pain Goal: 0 (09/73/53 2992)  Complications: No notable events documented.

## 2022-06-23 NOTE — Interval H&P Note (Signed)
History and Physical Interval Note:  06/23/2022 8:07 AM  Eugene Henderson  has presented today for surgery, with the diagnosis of atrial fibrillation.  The various methods of treatment have been discussed with the patient and family. After consideration of risks, benefits and other options for treatment, the patient has consented to  Procedure(s): TRANSESOPHAGEAL ECHOCARDIOGRAM (N/A) CARDIOVERSION (N/A) as a surgical intervention.  The patient's history has been reviewed, patient examined, no change in status, stable for surgery.  I have reviewed the patient's chart and labs.  Questions were answered to the patient's satisfaction.     Raniah Karan

## 2022-06-23 NOTE — Progress Notes (Signed)
*  PRELIMINARY RESULTS* Echocardiogram Echocardiogram Transesophageal has been performed.  Sherrie Sport 06/23/2022, 8:14 AM

## 2022-06-23 NOTE — Anesthesia Procedure Notes (Signed)
Date/Time: 06/23/2022 7:36 AM  Performed by: Johnna Acosta, CRNAPre-anesthesia Checklist: Patient identified, Timeout performed, Emergency Drugs available, Suction available and Patient being monitored Patient Re-evaluated:Patient Re-evaluated prior to induction Oxygen Delivery Method: Nasal cannula Preoxygenation: Pre-oxygenation with 100% oxygen Induction Type: IV induction

## 2022-06-23 NOTE — Plan of Care (Signed)
Patient back in atrial fibrillation with intermittent atrial flutter shortly after cardioversion. Bolus amiodarone 150mg . Will continue amiodarone infusion through today with plan for repeat cardioversion on Friday.   Lakitha Gordy 8:54 AM

## 2022-06-23 NOTE — Progress Notes (Signed)
OT Cancellation Note  Patient Details Name: Eugene Henderson MRN: [id number] DOB: 23-Feb-1951   Cancelled Treatment:    Reason Eval/Treat Not Completed: Patient at procedure or test/ unavailable. Chart reviewed. Pt currently off the floor for cardiac procedure. Will continue to follow acutely and re-attempt as medically appropriate.   Doneta Public 06/23/2022, 8:57 AM

## 2022-06-23 NOTE — CV Procedure (Signed)
   TRANSESOPHAGEAL ECHOCARDIOGRAM GUIDED DIRECT CURRENT CARDIOVERSION  NAME:  Eugene Henderson    MRN: [id number] DOB:  [birth year]    ADMIT DATE: 06/14/2022  INDICATIONS: Symptomatic atrial fibrillation  PROCEDURE:   Informed consent was obtained prior to the procedure. The risks, benefits and alternatives for the procedure were discussed and the patient comprehended these risks.  Risks include, but are not limited to, cough, sore throat, vomiting, nausea, somnolence, esophageal and stomach trauma or perforation, bleeding, low blood pressure, aspiration, pneumonia, infection, trauma to the teeth and death.    After a procedural time-out, the oropharynx was anesthetized and the patient was sedated by the anesthesia service. The transesophageal probe was inserted in the esophagus and stomach without difficulty and multiple views were obtained. Sedation by anesthesia.   COMPLICATIONS:    Complications: No complications Patient tolerated procedure well.  KEY FINDINGS:  Left ventricular function was severely reduced with an EF of 25% RV function was moderate to severely reduced The LA was dilated Moderate centrally directed mitral regurgitation No LAA clot  Full Report to follow.   CARDIOVERSION:     Indications:  Symptomatic Atrial Fibrillation  Procedure Details:  Once the TEE was complete, the patient had the defibrillator pads placed in the anterior and posterior position. Once an appropriate level of sedation was confirmed, the patient was cardioverted x 1 with 200J of biphasic synchronized energy.  The patient converted to NSR.  There were no apparent complications.  The patient had normal neuro status and respiratory status post procedure with vitals stable as recorded elsewhere.  Adequate airway was maintained throughout and vital signs monitored per protocol.  Cade Dashner Advanced Heart Failure 8:08 AM

## 2022-06-23 NOTE — Progress Notes (Signed)
Patient called out around 830 stating he was having chest pressure. ST on monitor. Varying between afib and ST. Cardiology called. Came to see patient. Given amiodarone bolus. Better controlled SR. Patient states some improvement. Will continue to monitor.

## 2022-06-23 NOTE — Progress Notes (Signed)
PT Cancellation Note  Patient Details Name: Eugene Henderson MRN: [id number] DOB: 12-03-1950   Cancelled Treatment:    Reason Eval/Treat Not Completed: Patient at procedure or test/unavailable (Chart reviewed. Pt OTF for cardiac procedure. Will continue to follow and resume treatment once medically cleared.)  8:29 AM, 06/23/22 Etta Grandchild, PT, DPT Physical Therapist - [facility]  [phone] ([place])    Jenipher Havel C 06/23/2022, 8:29 AM

## 2022-06-23 NOTE — Anesthesia Preprocedure Evaluation (Signed)
Anesthesia Evaluation  Patient identified by MRN, date of birth, ID band Patient awake    Reviewed: Allergy & Precautions, H&P , NPO status , Patient's Chart, lab work & pertinent test results, reviewed documented beta blocker date and time   History of Anesthesia Complications Negative for: history of anesthetic complications  Airway Mallampati: II  TM Distance: >3 FB Neck ROM: full    Dental  (+) Poor Dentition, Dental Advidsory Given, Edentulous Upper, Missing   Pulmonary shortness of breath (from a fib), asthma , neg sleep apnea, COPD,  COPD inhaler, neg recent URI, Current Smoker   Pulmonary exam normal breath sounds clear to auscultation       Cardiovascular Exercise Tolerance: Good hypertension, (-) angina +CHF  (-) Past MI and (-) Cardiac Stents + dysrhythmias Atrial Fibrillation (-) Valvular Problems/Murmurs Rhythm:regular Rate:Normal     Neuro/Psych negative neurological ROS  negative psych ROS   GI/Hepatic negative GI ROS,,,(+) Hepatitis - (Hep C)  Endo/Other  diabetes Hyperthyroidism   Renal/GU negative Renal ROS  negative genitourinary   Musculoskeletal   Abdominal   Peds  Hematology negative hematology ROS (+)   Anesthesia Other Findings Past Medical History: No date: Arthritis No date: Ascending aortic aneurysm (HCC)     Comment:  a. 09/2017 Stable TAA - 5.1cm. No date: Asthma No date: Chronic systolic CHF (congestive heart failure) ([place])     Comment:  a. EF 25-30% by echo in 07/2016 with cath showing no               significant CAD b. 01/2017: EF 30-35% with diffuse HK and              moderate MR; c. 07/2017 Echo: EF 30-35%, diff hK. Mild MR,              mildly dil LA. PASP 40mmHg. No date: Diverticulitis 05/13/2017: Diverticulitis of large intestine with perforation without  abscess or bleeding No date: Hypertension No date: Hyperthyroidism No date: NICM (nonischemic cardiomyopathy)  ([place]) No date: Noncompliance No date: Persistent atrial fibrillation (HCC)     Comment:  a. CHA2DS2VASc = 3-->Eliquis (? compliance).   Reproductive/Obstetrics negative OB ROS                             Anesthesia Physical Anesthesia Plan  ASA: 3  Anesthesia Plan: General   Post-op Pain Management:    Induction: Intravenous  PONV Risk Score and Plan: 1 and Propofol infusion and TIVA  Airway Management Planned: Natural Airway and Nasal Cannula  Additional Equipment:   Intra-op Plan:   Post-operative Plan:   Informed Consent: I have reviewed the patients History and Physical, chart, labs and discussed the procedure including the risks, benefits and alternatives for the proposed anesthesia with the patient or authorized representative who has indicated his/her understanding and acceptance.     Dental Advisory Given  Plan Discussed with: Anesthesiologist, CRNA and Surgeon  Anesthesia Plan Comments:        Anesthesia Quick Evaluation

## 2022-06-23 NOTE — Progress Notes (Addendum)
PROGRESS NOTE   Eugene Henderson  [id number]    DOB: [birth year]    DOA: 06/14/2022  PCP: Valerie Roys, DO   I have briefly reviewed patients previous medical [facility] in [facility].  Chief Complaint  Patient presents with   Chest Pain   Shortness of Breath    Brief Narrative:  72 year old male with PMH of HTN, HLD, type II DM, tobacco abuse, presented to the ED on 06/14/2022 with chest pain and dyspnea x 3 days.  He was initially admitted with diagnosis of abdominal pain.  No choledocholithiasis by imaging and GI signed off.  Cardiology consulted for perioperative CV risk assessment in anticipation of AAA and iliac aneurysm repairs, acute on chronic systolic CHF and A-fib.  Hospital course complicated by delirium.  He then underwent endovascular procedures for his AAA and iliac artery aneurysm on 1/11, postprocedure was admitted to ICU.  Slowly improving.  Transfer orders to telemetry bed on 1/12.  Slowly improving.  1/15: A-fib with RVR, advanced HF cardiologist consulting, started IV amiodarone with plan for TEE/DCCV postponed to 1/17 which was done on 06/23/2022 but patient went back into atrial fibrillation flutter postprocedure   Assessment & Plan:  Principal Problem:   Abdominal pain Active Problems:   Persistent atrial fibrillation (HCC)   Essential hypertension   Tobacco use   Chest pain   Diabetes mellitus without complication (HCC)   Morbid obesity (HCC)   Acute systolic heart failure (HCC)   AF (paroxysmal atrial fibrillation) (HCC)   Hyperlipidemia   Metal foreign body in head   Elevated bilirubin   Atrial fibrillation with RVR ([place])   Thoracoabdominal aortic aneurysm (TAAA) (HCC)   HFrEF (heart failure with reduced ejection fraction) ([place])   NICM (nonischemic cardiomyopathy) ([place])   Pre-operative cardiovascular examination   Abdominal pain: This was his admitting presentation.  Negative workup for choledocholithiasis. GI recommended outpatient follow-up for  cirrhosis, twice daily Protonix x 8 to 12 weeks, avoiding NSAIDs, no EGD was recommended and GI signed off.  Overall etiology unclear but wide DD: Gastritis, PUD, passive hepatic congestion from decompensated CHF, AAA etc..  Abdominal pain has resolved.  Bowel regimen initiated on 06/21/2022 for constipation.  Acute respiratory distress Present on admission.  Suspected due to decompensated CHF.  Could have underlying OSA/OHS based on body habitus.  This will have to be evaluated as outpatient.  Incentive spirometry.  Upon careful review of chart since admission, no clear hypoxia documented despite patient having dyspnea and tachypnea.  Thereby acute respiratory failure with hypoxia ruled out.  On room air.  Acute on chronic systolic CHF/nonischemic cardiomyopathy: 2D echo 2022: LVEF 30%.  Repeat echo this admission: LVEF 20-25%.  Prior left heart cath with no CAD.  Treated with IV Lasix 40 mg twice daily for a couple of days.  15.9 L thus far and - 1.09 in the last 24 hours.  Cardiology follow-up appreciated.  Following medication changes done by them: Lasix changed to 40 mg p.o. daily, ARB stopped, continue Entresto 24-26 mg twice daily, increased Toprol-XL to 50 Mg daily, continuing Aldactone 25 Mg daily, continue Jardiance 10 Mg daily, continuing Eliquis 5 Mg twice daily along with aspirin 81 Mg daily.  Clinically euvolemic.  Chest x-ray without acute findings.  continue digoxin 125 mcg .  Hypokalemia: Potassium 3.9, continue to replace to try and keep >4.  Persistent A-fib with RVR: Was on IV heparin post endovascular procedures. Was not on anticoagulation PTA.  Cardiology have changed metoprolol to  succinate 50 Mg daily and can be titrated up as needed.  Continue Eliquis 5 Mg twice daily, aspirin 81 Mg daily.  1/15, A-fib with RVR up to 170s.  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 Abdomen CTA revealed several abnormalities 1/8 (see for detailed report).  Following Cardiology preop clearance, patient underwent endovascular treatment of AAA and iliac artery aneurysms by vascular surgery on 1/11. Patient will need follow up with CT surgery after discharge for thoracic aortic aneurysm monitoring and management.    Delirium Resolved.   Essential hypertension Reasonably controlled.  As noted above, irbesartan discontinued, starting Entresto, changed to Toprol-XL and continuing spironolactone 25 mg daily.     Metal foreign body in head non acute issue.  Outpatient follow-up.   Hyperlipidemia Continued statin.  Thrombocytopenia: Resolved.  Mild transaminitis: Unclear etiology.  Resolved.  Isolated hyperbilirubinemia, stable, unclear etiology.   Body mass index is 35.16 kg/m./Morbid obesity This meets criteria for morbid obesity based on the presence of 1 or more chronic comorbidities. Patient has hypertension. This complicates overall care and prognosis.    DVT prophylaxis: Place TED hose Start: 06/14/22 1835.  Eliquis.   Code Status: Full Code:  Family Communication: None at bedside.. Disposition:  TEE/DCCV done on 06/23/2022     Consultants:   Gastroenterology Cardiology Vascular surgery  Procedures:   As noted above  Antimicrobials:      Subjective:  Patient noted to have converted to sinus rhythm after DCCV cardioversion.  Post cardioversion patient went into atrial fibrillation with intermittent atrial flutter was given amiodarone bolus 150 mg, continue amiodarone infusion today.  Objective:   Vitals:   06/23/22 0900 06/23/22 0920 06/23/22 1206 06/23/22 1610  BP: (!) 119/94 (!) 137/93 (!) 133/98 122/81  Pulse: 78 93 92 88  Resp: 10 (!) 25 19 19   Temp:   98.1 F (36.7  C) 98.2 F (36.8 C)  TempSrc:      SpO2: 100% 100% 100% 100%  Weight:      Height:        General exam: Elderly male, moderately built and obese sitting up comfortably in bed without distress.  Appears to be in good spirits. Respiratory system: Clear to auscultation.  No increased work of breathing. Cardiovascular system: S1 and S2 heard, irregularly irregular and tachycardic.  No JVD, murmurs or pedal edema.  Telemetry personally reviewed: A-fib with controlled ventricular rate. Gastrointestinal system: Abdomen is nondistended, soft and nontender. No organomegaly or masses felt. Normal bowel sounds heard.  Bilateral groin endovascular interventions sites reexamined today and without acute findings. GU: Foley catheter has been discontinued.  Voiding spontaneously. Central nervous system: Alert and oriented. No focal neurological deficits. Extremities: Symmetric 5 x 5 power. Skin: No rashes, lesions or ulcers Psychiatry: Judgement and insight appear somewhat impaired. Mood & affect appropriate. 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, chronic    Data Reviewed:   I have personally reviewed following labs and imaging studies   CBC: Recent Labs  Lab 06/19/22 0415 06/20/22 0343 06/21/22 0436  WBC 7.4 6.6 6.1  HGB 11.7* 12.0* 12.9*  HCT 36.3* 36.9* 38.9*  MCV 90.8 90.0 88.6  PLT 142* 137* 160     Basic Metabolic Panel: Recent Labs  Lab 06/19/22 0415 06/20/22 0343 06/21/22 0436 06/22/22 0610 06/23/22 0402  NA 131* 133* 134* 133* 134*  K 3.4* 3.3* 3.7 3.7 3.9  CL 94* 93* 98 100 102  CO2 29 31 25 25 28   GLUCOSE 114* 88 92 123* 105*  BUN 10 10 12 13 11   CREATININE 0.70 0.72 0.71 0.81 0.70  CALCIUM 8.0* 8.2* 8.2* 8.3* 8.4*  MG  --  1.9  --  2.2  --   PHOS  --   --   --   --  3.9     Liver Function Tests: Recent Labs  Lab 06/17/22 0217 06/18/22 0459 06/19/22 0415 06/20/22 0343 06/21/22 0436 06/23/22 0402  AST 197* 96* 53* 32 20  --   ALT  164* 137* 95* 68* 50*  --   ALKPHOS 69 61 53 52 54  --   BILITOT 1.7* 1.1 1.9* 1.7* 1.7*  --   PROT 7.1 6.7 6.5 6.5 6.8  --   ALBUMIN 3.8 3.6 3.3* 3.2* 3.2* 3.1*     CBG: Recent Labs  Lab 06/17/22 2316 06/18/22 0307 06/18/22 0714  GLUCAP 149* 136* 159*     Microbiology Studies:   Recent Results (from the past 240 hour(s))  Resp panel by RT-PCR (RSV, Flu A&B, Covid) Anterior Nasal Swab     Status: None   Collection Time: 06/14/22  2:16 PM   Specimen: Anterior Nasal Swab  Result Value Ref Range Status   SARS Coronavirus 2 by RT PCR NEGATIVE NEGATIVE Final    Comment: (NOTE) SARS-CoV-2 target nucleic acids are NOT DETECTED.  The SARS-CoV-2 RNA is generally detectable in upper respiratory specimens during the acute phase of infection. The lowest concentration of SARS-CoV-2 viral copies this assay can detect is 138 copies/mL. A negative result does not preclude SARS-Cov-2 infection and should not be used as the sole basis for treatment or other patient management decisions. A negative result may occur with  improper specimen collection/handling, submission of specimen other than nasopharyngeal swab, presence of viral mutation(s) within the areas targeted by this assay, and inadequate number of viral copies(<138 copies/mL). A negative result must be combined with clinical observations, patient history, and epidemiological information. The expected result is Negative.  Fact Sheet for Patients:  08/17/22  Fact Sheet for Healthcare Providers:  08/13/22  This test is no t yet approved or cleared by the [employer] FDA and  has been authorized for detection and/or diagnosis of SARS-CoV-2 by FDA under an Emergency Use Authorization (EUA). This EUA will remain  in effect (meaning this test can be used) for the duration of the COVID-19 declaration under Section 564(b)(1) of the Act, 21 U.S.C.section  360bbb-3(b)(1), unless the authorization is terminated  or revoked sooner.       Influenza A by PCR NEGATIVE NEGATIVE Final   Influenza B by PCR NEGATIVE NEGATIVE Final    Comment: (NOTE) The Xpert Xpress SARS-CoV-2/FLU/RSV plus assay is intended as an aid in the diagnosis of influenza from Nasopharyngeal swab specimens and should not be used as a sole basis for treatment. Nasal washings and aspirates are unacceptable for Xpert Xpress SARS-CoV-2/FLU/RSV testing.  Fact Sheet for Patients: SeriousBroker.it  Fact Sheet for Healthcare Providers: [place]  This test is not yet approved or cleared by the [employer] FDA and has been authorized for detection and/or diagnosis of SARS-CoV-2 by FDA under an Emergency Use Authorization (EUA). This EUA will remain in effect (meaning this test can be used) for the duration of  the COVID-19 declaration under Section 564(b)(1) of the Act, 21 U.S.C. section 360bbb-3(b)(1), unless the authorization is terminated or revoked.     Resp Syncytial Virus by PCR NEGATIVE NEGATIVE Final    Comment: (NOTE) Fact Sheet for Patients: EntrepreneurPulse.com.au  Fact Sheet for Healthcare Providers: IncredibleEmployment.be  This test is not yet approved or cleared by the [place] FDA and has been authorized for detection and/or diagnosis of SARS-CoV-2 by FDA under an Emergency Use Authorization (EUA). This EUA will remain in effect (meaning this test can be used) for the duration of the COVID-19 declaration under Section 564(b)(1) of the Act, 21 U.S.C. section 360bbb-3(b)(1), unless the authorization is terminated or revoked.  Performed at [facility], [address] [place], [place] [address]   Culture, blood (Routine X 2) w Reflex to ID Panel     Status: None   Collection Time: 06/14/22  6:56 PM   Specimen: BLOOD RIGHT ARM  Result Value  Ref Range Status   Specimen Description BLOOD RIGHT ARM  Final   Special Requests   Final    BOTTLES DRAWN AEROBIC AND ANAEROBIC Blood Culture adequate volume   Culture   Final    NO GROWTH 5 DAYS Performed at [facility], [address], [place], [place] [address]    Report Status 06/19/2022 FINAL  Final  Culture, blood (Routine X 2) w Reflex to ID Panel     Status: None   Collection Time: 06/14/22  6:56 PM   Specimen: BLOOD  Result Value Ref Range Status   Specimen Description BLOOD RIGHT HAND  Final   Special Requests   Final    BOTTLES DRAWN AEROBIC AND ANAEROBIC Blood Culture adequate volume   Culture   Final    NO GROWTH 5 DAYS Performed at [facility], [address], [place], [place] [address]    Report Status 06/19/2022 FINAL  Final  MRSA Next Gen by PCR, Nasal     Status: None   Collection Time: 06/17/22  6:21 PM   Specimen: Nasal Mucosa; Nasal Swab  Result Value Ref Range Status   MRSA by PCR Next Gen NOT DETECTED NOT DETECTED Final    Comment: (NOTE) The GeneXpert MRSA Assay (FDA approved for NASAL specimens only), is one component of a comprehensive MRSA colonization surveillance program. It is not intended to diagnose MRSA infection nor to guide or monitor treatment for MRSA infections. Test performance is not FDA approved in patients less than 52 years old. Performed at [facility], [address] [place], [place] [address]     Radiology Studies:  No results found.  Scheduled Meds:    apixaban  5 mg Oral BID   aspirin EC  81 mg Oral Q0600   atorvastatin  40 mg Oral Daily   Chlorhexidine Gluconate Cloth  6 each Topical Q0600   digoxin  0.0625 mg Oral Daily   empagliflozin  10 mg Oral Daily   furosemide  40 mg Oral Daily   metoprolol succinate  50 mg Oral Daily   mometasone-formoterol  2 puff Inhalation BID   nitroGLYCERIN       nitroGLYCERIN       pantoprazole (PROTONIX) IV  40 mg Intravenous Q12H   polyethylene  glycol  17 g Oral Daily   sacubitril-valsartan  1 tablet Oral BID   senna-docusate  1 tablet Oral BID   spironolactone  25 mg Oral Daily   thiamine  100 mg Oral Daily   varenicline  1 mg Oral Daily    Continuous Infusions:    sodium chloride Stopped (06/15/22 0717)   sodium chloride     amiodarone 60 mg/hr (06/23/22 1112)     LOS: 9 days     Adan Sis, MD,    To contact the attending provider between 7A-7P or the covering provider during after hours 7P-7A, please log into the web site [email or web address] and access using universal [place] password for that web site. If you do not have the password, please call the hospital operator.  06/23/2022, 5:13 PM

## 2022-06-23 NOTE — Progress Notes (Signed)
ADVANCED HEART FAILURE CONSULT NOTE  Referring Physician: No ref. provider found  Primary Care: Valerie Roys, DO Primary Cardiologist: Dr. Fletcher Anon  Interval hx/Subjective: - Doing well this AM; laying flat in bed with no complains.  - TEE/DCCV w/ successful cardioversion to NSR.    PHYSICAL EXAM: Vitals:   06/23/22 0807 06/23/22 0808  BP:  100/67  Pulse: 77 76  Resp: (!) 26 (!) 27  Temp:    SpO2: 98% 98%   GENERAL: Well nourished, well developed, and in no apparent distress at rest.  HEENT: Negative for arcus senilis or xanthelasma. There is no scleral icterus.  The mucous membranes are pink and moist.   NECK: Supple, No masses. Normal carotid upstrokes without bruits. No masses or thyromegaly.    CHEST: There are no chest wall deformities. There is no chest wall tenderness. Respirations are unlabored.  Lungs- cta b/l CARDIAC:  JVP: 7-8cm         Normal S1, S2  tachycardic, irregular. No murmurs, rubs or gallops.  Pulses are 2+ and symmetrical in upper and lower extremities. no edema.  ABDOMEN: Soft, non-tender, non-distended. There are no masses or hepatomegaly. There are normal bowel sounds.  EXTREMITIES: Warm and well perfused with no cyanosis, clubbing.  LYMPHATIC: No axillary or supraclavicular lymphadenopathy.  NEUROLOGIC: Patient is oriented x3 with no focal or lateralizing neurologic deficits.  PSYCH: Patients affect is appropriate, there is no evidence of anxiety or depression.  SKIN: Warm and dry; no lesions or wounds.   DATA REVIEW  QQV:ZDGL w/ RVR as per my read  ECHO: 07/2016: LVEF 25%-30% 01/2017: LVEF 30-35% 07/2017: LVEF 30-35% 10/22: LVEF 30% 06/16/22: LVEF 20-25% with severely reduced RV function  CATH: RHC/LHC:  1. No significant coronary artery disease. 2. Severely reduced LV systolic function by echo. Left ventricular angiography was not performed.  3. Right heart catheterization showed minimally elevated filling pressures and pulmonary  hypertension. Moderately reduced cardiac output. RA pressure: 12 mmHg, RV pressure: 40 over 6 mmHg, PA pressure 37/12 with a mean of 12 mmHg. Pulmonary capillary wedge pressure was 12. Cardiac output was 4.16 with a cardiac index of 1.77.   ASSESSMENT & PLAN:  Heart failure with reduced EF Etiology of OV:FIEPPIRJJOA cardiomyopathy by LHC in 2018; has has had persistent atrial fibrillation since 2018 which is possibly the etiology of his HFrEF; can consider CMR at a later date.  NYHA class / AHA Stage:NYHA III Volume status & Diuretics: Euvolemic on exam today, continue lasix 40mg  PO daily Vasodilators:Entresto 24/26mg  BID, BP at goal. Will continue.  Beta-Blocker:toprol 50mg  daily; digoxin .0650mcg daily CZY:SAYTKZSWFUXNAT 25mg  daily Cardiometabolic:jardiance 10mg  Devices therapies & Valvulopathies:HFrEF since 2018 with LVEF persistently below 30%. Will need EP consult for outpatient ICD evaluation at discharge. Patient has moderate to severe MR by TEE today. Advanced therapies:Not a candidate currently; I think there are significant hurdles with psychosocial barriers that need to be overcome initially.   2. Atrial fibrillation with RVR - Transition to amiodarone 400mg  BID for 7 days followed by 400mg  daily until follow up in clinic.  3. 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    4. HTN - Well controlled on above regiment.  5. T2DM - Followed by internal medicine team - Jardiance also started.   Ready for discharge from heart failure standpoint. Cardioverted to NSR rhythm.   Cardiac meds at discharge:  Amiodarone 400mg  BID  for 7 days then 400mg  daily Apixaban 5mg  BID Jardiance 10mg  daily Entresto 24/26mg  BID Toprol XL 50mg  daily Spironolactone 25mg  daily Lasix 40mg  daily; take an extra 40mg  for weight gain >3lb overnight or lower extremity edema.    Eugene Henderson Advanced Heart Failure Mechanical Circulatory Support

## 2022-06-24 ENCOUNTER — Inpatient Hospital Stay: Payer: Medicare HMO

## 2022-06-24 ENCOUNTER — Encounter: Payer: Self-pay | Admitting: Cardiology

## 2022-06-24 MED ORDER — OXYCODONE HCL 5 MG PO TABS
10.0000 mg | ORAL_TABLET | Freq: Four times a day (QID) | ORAL | Status: DC | PRN
  Administered 2022-06-24 – 2022-06-26 (×4): 10 mg via ORAL
  Filled 2022-06-24 (×5): qty 2

## 2022-06-24 NOTE — Progress Notes (Addendum)
Physical Therapy Treatment Patient Details Name: Eugene Henderson MRN: [id number] DOB: 1951-05-03 Today's Date: 06/24/2022   History of Present Illness Eugene Henderson is a 71yoM who comes to [facility] on 06/14/22 with CP and SOB. 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 In ED HR in 120-150s in RVR. BNP 575. Workup correlates pain symptoms with suspected cholecystitis, GI consulted then signed off on 1/9 without intervention. 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 Pt has recently been havinh AF/RVR difficulty, went for cardioversion 1/17 with only brief correction, plans for repeat cardioversion 1/19.    PT Comments    Pt in chair eating lunch on first attempt, author decided to return after meal. Upon return, pt found in recliner SOB, appears slightly anxious, is tremulous, elevated RR. Pt reports he just returned from [place], feels like he stood too long, as this happens now when he is up for 2-3 minutes. He says this has been ongoing last 3 weeks, however author has AMB pt this admission into hallway without seeing this activity response. Vital signs unremarkable. Presentation improves after 2-3 minutes which is typical per patient. Author established seated vitals , then standing x0 and x1 minutes standing, presentation repeats after 1 minute standing, but pt able to remain up for completion of vitals. Again, no remarkable vitals. Author decides to end session at this time and defer mobility needs to as needed for today. Pt continued to do well with basic mobility in room, but is concerned also about continued pain in epigastric area, also describes some pain radiating from umbilicus area. MD made aware.       06/24/22 1356  Therapy Vitals  Patient Position (if appropriate) Orthostatic Vitals  Orthostatic Sitting  BP- Sitting 122/80  Pulse- Sitting 65  Orthostatic Standing at 0 minutes  BP- Standing at 0 minutes  129/90 (increased breathlessness starts after 1 minute standing, no appreciable vitals changes to correlate symptoms)  Pulse- Standing at 0 minutes 67  Orthostatic Standing at 1 minutes  BP- Standing at 1 minutes (!) 120/96  Pulse- Standing at 1 minutes 79  Oxygen Therapy  SpO2 99 %  O2 Device Room Air       Recommendations for follow up therapy are one component of a multi-disciplinary discharge planning process, led by the attending physician.  Recommendations may be updated based on patient status, additional functional criteria and insurance authorization.  Follow Up Recommendations  No PT follow up     Assistance Recommended at Discharge Set up Supervision/Assistance  Patient can return home with the following A little help with walking and/or transfers;Assistance with cooking/housework;Assist for transportation;Help with stairs or ramp for entrance   Equipment Recommendations  Rolling walker (2 wheels)    Recommendations for Other Services       Precautions / Restrictions Precautions Precautions: Fall Restrictions Weight Bearing Restrictions: No     Mobility  Bed Mobility               General bed mobility comments: in chair already, most of day; unable to find a position of comfort in bed without significant effort    Transfers Overall transfer level: Modified independent Equipment used: None               General transfer comment: stands in front of recliner x 2 minutes for BP check x2; has increased sudden onset dyspnea at 1 minute, VSS  Ambulation/Gait Ambulation/Gait assistance:  (deferred; pt recently returned from AMB to [place], prolonged standing 2-3 minutes for pericare and hand hygiene; returned to recliner tremulous and with dysnea.)                 Stairs             Wheelchair Mobility    Modified Rankin (Stroke Patients Only)       Balance Overall balance assessment: Modified Independent, Mild deficits observed,  not formally tested                                          Cognition Arousal/Alertness: Awake/alert Behavior During Therapy: WFL for tasks assessed/performed Overall Cognitive Status: Within Functional Limits for tasks assessed                                          Exercises      General Comments        Pertinent Vitals/Pain Pain Assessment Pain Assessment: Faces Faces Pain Scale: Hurts even more Pain Location: ABD always starts near umbilicus, with variable referral based on position. Pain Descriptors / Indicators: Aching, Sharp Pain Intervention(s): Limited activity within patient's tolerance, Monitored during session, Premedicated before session    Home Living                          Prior Function            PT Goals (current goals can now be found in the care plan section) Acute Rehab PT Goals Patient Stated Goal: return to typical mobility regimen PT Goal Formulation: With patient Time For Goal Achievement: 07/04/22 Potential to Achieve Goals: Fair Progress towards PT goals: Not progressing toward goals - comment    Frequency    Min 2X/week      PT Plan Current plan remains appropriate    Co-evaluation              AM-PAC PT "6 Clicks" Mobility   Outcome Measure  Help needed turning from your back to your side while in a flat bed without using bedrails?: None Help needed moving from lying on your back to sitting on the side of a flat bed without using bedrails?: None Help needed moving to and from a bed to a chair (including a wheelchair)?: None Help needed standing up from a chair using your arms (e.g., wheelchair or bedside chair)?: None Help needed to walk in hospital room?: A Little Help needed climbing 3-5 steps with a railing? : A Little 6 Click Score: 22    End of Session   Activity Tolerance: Patient limited by pain;Treatment limited secondary to medical complications  (Comment) Patient left: in chair;with call bell/phone within reach Nurse Communication: Mobility status PT Visit Diagnosis: Difficulty in walking, not elsewhere classified (R26.2)     Time: 1696-7893 PT Time Calculation (min) (ACUTE ONLY): 18 min  Charges:  $Therapeutic Activity: 8-22 mins                    2:17 PM, 06/24/22 Etta Grandchild, PT, DPT Physical Therapist - [facility]  [phone] ([place])   Dorsie Sethi C 06/24/2022, 2:07 PM

## 2022-06-24 NOTE — Anesthesia Postprocedure Evaluation (Signed)
Anesthesia Post Note  Patient: Tuff Clabo  Procedure(s) Performed: TRANSESOPHAGEAL ECHOCARDIOGRAM CARDIOVERSION  Patient location during evaluation: PACU Anesthesia Type: General Level of consciousness: awake and alert Pain management: pain level controlled Vital Signs Assessment: post-procedure vital signs reviewed and stable Respiratory status: spontaneous breathing, nonlabored ventilation, respiratory function stable and patient connected to nasal cannula oxygen Cardiovascular status: blood pressure returned to baseline and stable Postop Assessment: no apparent nausea or vomiting Anesthetic complications: no   No notable events documented.   Last Vitals:  Vitals:   06/24/22 1209 06/24/22 1356  BP: 104/60   Pulse:    Resp: 20   Temp:    SpO2:  99%    Last Pain:  Vitals:   06/24/22 1207  TempSrc: Oral  PainSc:                  Martha Clan

## 2022-06-24 NOTE — Progress Notes (Signed)
OT Cancellation Note  Patient Details Name: Eugene Henderson MRN: [id number] DOB: [birth year]   Cancelled Treatment:    Reason Eval/Treat Not Completed: Patient declined, no reason specified, Pt up in chair, slightly restless, stating that he's just trying to get comfortable.  Pt declined getting back to bed.  Pt declined out of chair activities.  Will attempt at a later date as able.    Leta Speller, MS, OTR/L  Darleene Cleaver 06/24/2022, 3:20 PM

## 2022-06-24 NOTE — Care Management Important Message (Signed)
Important Message  Patient Details  Name: Eugene Henderson MRN: [id number] Date of Birth: 1951-02-16   Medicare Important Message Given:  Yes     [name] 06/24/2022, 1:12 PM

## 2022-06-24 NOTE — H&P (View-Only) (Signed)
   ADVANCED HEART FAILURE CONSULT NOTE  Referring Physician: No ref. provider found  Primary Care: Valerie Roys, DO Primary Cardiologist: Dr. Fletcher Anon  Interval hx/Subjective: - No complaints this morning; back in rate controlled atrial fibrillation this morning.    PHYSICAL EXAM: Vitals:   06/24/22 0719 06/24/22 0901  BP: 103/80   Pulse: 80   Resp: 18 16  Temp: 98 F (36.7 C)   SpO2: 100%    GENERAL: Well nourished, well developed, and in no apparent distress at rest.  HEENT: Negative for arcus senilis or xanthelasma. There is no scleral icterus.  The mucous membranes are pink and moist.   NECK: Supple, No masses. Normal carotid upstrokes without bruits. No masses or thyromegaly.    CHEST: There are no chest wall deformities. There is no chest wall tenderness. Respirations are unlabored.  Lungs- cta b/l; no crackles CARDIAC:  JVP: 7cm         Normal S1, S2  tachycardic, irregular. No murmurs, rubs or gallops.  Pulses are 2+ and symmetrical in upper and lower extremities. no edema.  ABDOMEN: Soft, non-tender, non-distended. There are no masses or hepatomegaly. There are normal bowel sounds.  EXTREMITIES: warm with no edema.  LYMPHATIC: No axillary or supraclavicular lymphadenopathy.  NEUROLOGIC: Patient is oriented x3 with no focal or lateralizing neurologic deficits.  PSYCH: Patients affect is appropriate, there is no evidence of anxiety or depression.  SKIN: Warm and dry; no lesions or wounds.   DATA REVIEW  WUJ:WJXB w/ RVR as per my read  ECHO: 07/2016: LVEF 25%-30% 01/2017: LVEF 30-35% 07/2017: LVEF 30-35% 10/22: LVEF 30% 06/16/22: LVEF 20-25% with severely reduced RV function  CATH: RHC/LHC:  1. No significant coronary artery disease. 2. Severely reduced LV systolic function by echo. Left ventricular angiography was not performed.  3. Right heart catheterization showed minimally elevated filling pressures and pulmonary hypertension. Moderately reduced cardiac  output. RA pressure: 12 mmHg, RV pressure: 40 over 6 mmHg, PA pressure 37/12 with a mean of 12 mmHg. Pulmonary capillary wedge pressure was 12. Cardiac output was 4.16 with a cardiac index of 1.77.   ASSESSMENT & PLAN:  Heart failure with reduced EF Etiology of JY:NWGNFAOZHYQ cardiomyopathy by LHC in 2018; has has had persistent atrial fibrillation since 2018 which is possibly the etiology of his HFrEF; can consider CMR at a later date.  NYHA class / AHA Stage:NYHA IIB, improved now Volume status & Diuretics: Euvolemic on exam today, continue lasix 40mg  PO daily Vasodilators:Entresto 24/26mg  BID, BP at goal. Will continue.  Beta-Blocker:toprol 50mg  daily; digoxin .0651mcg daily MVH:[id number] 25mg  daily Cardiometabolic:jardiance 10mg  Devices therapies & Valvulopathies:HFrEF since 2018 with LVEF persistently below 30%. Will need EP consult for outpatient ICD evaluation at discharge. Patient has moderate to severe MR by TEE on 06/23/22.  Advanced therapies:Not a candidate currently; I think there are significant hurdles with psychosocial barriers that need to be overcome initially.   2. Atrial fibrillation with RVR - Continue amiodarone gtt until tomorrow. Plan for repeat DCCV tomorrow AM.  3. 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    4. HTN - Well controlled on above regiment.  5. T2DM - Followed by internal medicine team - Jardiance also started.   Patient back in atrial fibrillation; will continue loading with IV amiodarone and plan for cardioversion followed by discharge tomorrow.   Eugene Henderson Advanced Heart Failure Mechanical Circulatory Support

## 2022-06-24 NOTE — TOC Progression Note (Signed)
Transition of Care [facility] - Progression Note    Patient Details  Name: Eugene Henderson MRN: [id number] Date of Birth: [birth year]  Transition of Care [facility]) CM/SW Contact  Laurena Slimmer, RN Phone Number: 06/24/2022, 11:54 AM  Clinical Narrative:    Case reviewed for new DME needs and changes in disposition.         Expected Discharge Plan and Services                                               Social Determinants of Health (SDOH) Interventions SDOH Screenings   Food Insecurity: No Food Insecurity (06/15/2022)  Housing: Low Risk  (06/15/2022)  Transportation Needs: No Transportation Needs (06/15/2022)  Utilities: Not At Risk (06/15/2022)  Depression (PHQ2-9): High Risk (06/18/2021)  Financial Resource Strain: Low Risk  (07/11/2020)  Physical Activity: Insufficiently Active (07/11/2020)  Stress: No Stress Concern Present (07/11/2020)  Tobacco Use: High Risk (06/24/2022)    Readmission Risk Interventions    06/16/2022    3:33 PM  Readmission Risk Prevention Plan  Transportation Screening Complete  HRI or [place] Complete  Social Work Consult for [place] Planning/Counseling Complete  Palliative Care Screening Not Applicable  Medication Review [employer]) Complete

## 2022-06-24 NOTE — Progress Notes (Signed)
PROGRESS NOTE   Eugene Henderson  ZOX:[id number]    DOB: 11-10-1950    DOA: 06/14/2022  PCP: Dorcas Carrow, DO   I have briefly reviewed patients previous medical [facility] in [facility].  Chief Complaint  Patient presents with   Chest Pain   Shortness of Breath    Brief Narrative:  72 year old male with PMH of HTN, HLD, type II DM, tobacco abuse, presented to the ED on 06/14/2022 with chest pain and dyspnea x 3 days.  He was initially admitted with diagnosis of abdominal pain.  No choledocholithiasis by imaging and GI signed off.  Cardiology consulted for perioperative CV risk assessment in anticipation of AAA and iliac aneurysm repairs, acute on chronic systolic CHF and A-fib.  Hospital course complicated by delirium.  He then underwent endovascular procedures for his AAA and iliac artery aneurysm on 1/11, postprocedure was admitted to ICU.  Slowly improving.  Transfer orders to telemetry bed on 1/12.  Slowly improving.  1/15: A-fib with RVR, advanced HF cardiologist consulting, started IV amiodarone with plan for TEE/DCCV postponed to 1/17 which was done on 06/23/2022 but patient went back into atrial fibrillation flutter postprocedure, another cardioversion planned for 06/24/2022   Assessment & Plan:  Principal Problem:   Abdominal pain Active Problems:   Persistent atrial fibrillation (HCC)   Essential hypertension   Tobacco use   Chest pain   Diabetes mellitus without complication (HCC)   Morbid obesity (HCC)   Acute systolic heart failure (HCC)   AF (paroxysmal atrial fibrillation) (HCC)   Hyperlipidemia   Metal foreign body in head   Elevated bilirubin   Atrial fibrillation with RVR (HCC)   Thoracoabdominal aortic aneurysm (TAAA) (HCC)   HFrEF (heart failure with reduced ejection fraction) (HCC)   NICM (nonischemic cardiomyopathy) (HCC)   Pre-operative cardiovascular examination   Abdominal pain: This was his admitting presentation.  Negative workup for  choledocholithiasis. GI recommended outpatient follow-up for cirrhosis, twice daily Protonix x 8 to 12 weeks, avoiding NSAIDs, no EGD was recommended and GI signed off.  Overall etiology unclear but wide DD: Gastritis, PUD, passive hepatic congestion from decompensated CHF, AAA etc..  Abdominal pain has resolved.  Bowel regimen initiated on 06/21/2022 for constipation.  Acute respiratory distress Present on admission.  Suspected due to decompensated CHF.  Could have underlying OSA/OHS based on body habitus.  This will have to be evaluated as outpatient.  Incentive spirometry.  Upon careful review of chart since admission, no clear hypoxia documented despite patient having dyspnea and tachypnea.  Thereby acute respiratory failure with hypoxia ruled out.  On room air.  Acute on chronic systolic CHF/nonischemic cardiomyopathy: 2D echo 2022: LVEF 30%.  Repeat echo this admission: LVEF 20-25%.  Prior left heart cath with no CAD.  Treated with IV Lasix 40 mg twice daily for a couple of days.  15.9 L thus far and - 1.09 in the last 24 hours.  Cardiology follow-up appreciated.  Following medication changes done by them: Lasix changed to 40 mg p.o. daily, ARB stopped, continue Entresto 24-26 mg twice daily, increased Toprol-XL to 50 Mg daily, continuing Aldactone 25 Mg daily, continue Jardiance 10 Mg daily, continuing Eliquis 5 Mg twice daily along with aspirin 81 Mg daily.  Clinically euvolemic.  Chest x-ray without acute findings.  continue digoxin 125 mcg .  Hypokalemia: Potassium 3.9, continue to replace to try and keep >4.  Persistent A-fib with RVR: Was on IV heparin post endovascular procedures. Was not on anticoagulation PTA.  Cardiology have changed metoprolol to succinate 50 Mg daily and can be titrated up as needed.  Continue Eliquis 5 Mg twice daily, aspirin 81 Mg daily.  1/15, A-fib with RVR up to 170s.  Cardiology initiated digoxin load, IV amiodarone with  TEE/DCCV done on 06/23/2022 patient went  back into atrial flutter fibrillation with RVR was given amiodarone bolus 150 mg iv on 06/23/2022.  Another cardioversion planned for 06/24/2022  Thoracic aneurysm up to 4.9cm  AAA up to 3.8cm  internal iliac aneurysm 3.1cm Possible contribution of presenting abdominal pain. Abdomen CTA revealed several abnormalities 1/8 (see for detailed report).  Following Cardiology preop clearance, patient underwent endovascular treatment of AAA and iliac artery aneurysms by vascular surgery on 1/11.  Follow-up with CT surgery outpatient for evaluation and further repair of aneurysm if needed.   Delirium Resolved.   Essential hypertension Reasonably controlled.  As noted above, irbesartan discontinued, starting Entresto, changed to Toprol-XL and continuing spironolactone 25 mg daily.     Metal foreign body in head non acute issue.  Outpatient follow-up.   Hyperlipidemia Continued statin.  Thrombocytopenia: Resolved.  Mild transaminitis: Unclear etiology.  Resolved.  Isolated hyperbilirubinemia, stable, unclear etiology.   Body mass index is 35.16 kg/m./Morbid obesity This meets criteria for morbid obesity based on the presence of 1 or more chronic comorbidities. Patient has hypertension. This complicates overall care and prognosis.    DVT prophylaxis: Place TED hose Start: 06/14/22 1835.  Eliquis.   Code Status: Full Code:  Family Communication: None at bedside.. Disposition:  TEE/DCCV done on 06/23/2022     Consultants:   Gastroenterology Cardiology Vascular surgery  Procedures:   As noted above  Antimicrobials:      Subjective:  Patient noted to have converted to sinus rhythm after DCCV cardioversion.  Post cardioversion. Cardiology planning for another cardioversion today and possibly discharge tomorrow.    Objective:   Vitals:   06/24/22 1356 06/24/22 1400 06/24/22 1500 06/24/22 1517  BP:    125/71  Pulse:    79  Resp:  15 17 (!) 22  Temp:    98.5 F (36.9 C)   TempSrc:    Oral  SpO2: 99%   99%  Weight:      Height:        General exam: Elderly male, moderately built and obese sitting up comfortably in bed without distress.  Appears to be in good spirits. Respiratory system: Clear to auscultation.  No increased work of breathing. Cardiovascular system: S1 and S2 heard, irregularly irregular and tachycardic.  No JVD, murmurs or pedal edema.  Telemetry personally reviewed: A-fib with controlled ventricular rate. Gastrointestinal system: Abdomen is nondistended, soft and nontender. No organomegaly or masses felt. Normal bowel sounds heard.  Bilateral groin endovascular interventions sites reexamined today and without acute findings. GU: Foley catheter has been discontinued.  Voiding spontaneously. Central nervous system: Alert and oriented. No focal neurological deficits. Extremities: Symmetric 5 x 5 power. Skin: No rashes, lesions or ulcers Psychiatry: Judgement and insight appear somewhat impaired. Mood & affect appropriate. 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, chronic    Data Reviewed:   I have personally reviewed following labs and imaging studies   CBC: Recent Labs  Lab 06/19/22 0415 06/20/22 0343 06/21/22 0436  WBC 7.4 6.6 6.1  HGB 11.7* 12.0* 12.9*  HCT 36.3* 36.9* 38.9*  MCV 90.8 90.0 88.6  PLT 142* 137* 160     Basic Metabolic Panel: Recent Labs  Lab 06/19/22 0415 06/20/22 0343  06/21/22 0436 06/22/22 0610 06/23/22 0402  NA 131* 133* 134* 133* 134*  K 3.4* 3.3* 3.7 3.7 3.9  CL 94* 93* 98 100 102  CO2 29 31 25 25 28   GLUCOSE 114* 88 92 123* 105*  BUN 10 10 12 13 11   CREATININE 0.70 0.72 0.71 0.81 0.70  CALCIUM 8.0* 8.2* 8.2* 8.3* 8.4*  MG  --  1.9  --  2.2  --   PHOS  --   --   --   --  3.9     Liver Function Tests: Recent Labs  Lab 06/18/22 0459 06/19/22 0415 06/20/22 0343 06/21/22 0436 06/23/22 0402  AST 96* 53* 32 20  --   ALT 137* 95* 68* 50*  --   ALKPHOS 61 53 52 54  --    BILITOT 1.1 1.9* 1.7* 1.7*  --   PROT 6.7 6.5 6.5 6.8  --   ALBUMIN 3.6 3.3* 3.2* 3.2* 3.1*     CBG: Recent Labs  Lab 06/17/22 2316 06/18/22 0307 06/18/22 0714  GLUCAP 149* 136* 159*     Microbiology Studies:   Recent Results (from the past 240 hour(s))  Culture, blood (Routine X 2) w Reflex to ID Panel     Status: None   Collection Time: 06/14/22  6:56 PM   Specimen: BLOOD RIGHT ARM  Result Value Ref Range Status   Specimen Description BLOOD RIGHT ARM  Final   Special Requests   Final    BOTTLES DRAWN AEROBIC AND ANAEROBIC Blood Culture adequate volume   Culture   Final    NO GROWTH 5 DAYS Performed at [facility], [address] [place], [place] [address]    Report Status 06/19/2022 FINAL  Final  Culture, blood (Routine X 2) w Reflex to ID Panel     Status: None   Collection Time: 06/14/22  6:56 PM   Specimen: BLOOD  Result Value Ref Range Status   Specimen Description BLOOD RIGHT HAND  Final   Special Requests   Final    BOTTLES DRAWN AEROBIC AND ANAEROBIC Blood Culture adequate volume   Culture   Final    NO GROWTH 5 DAYS Performed at [facility], [address], [place], [place] [address]    Report Status 06/19/2022 FINAL  Final  MRSA Next Gen by PCR, Nasal     Status: None   Collection Time: 06/17/22  6:21 PM   Specimen: Nasal Mucosa; Nasal Swab  Result Value Ref Range Status   MRSA by PCR Next Gen NOT DETECTED NOT DETECTED Final    Comment: (NOTE) The GeneXpert MRSA Assay (FDA approved for NASAL specimens only), is one component of a comprehensive MRSA colonization surveillance program. It is not intended to diagnose MRSA infection nor to guide or monitor treatment for MRSA infections. Test performance is not FDA approved in patients less than 65 years old. Performed at [facility], [address] [place], [place] [address]     Radiology Studies:  No results found.  Scheduled Meds:    apixaban  5 mg Oral BID    aspirin EC  81 mg Oral Q0600   atorvastatin  40 mg Oral Daily   Chlorhexidine Gluconate Cloth  6 each Topical Q0600   digoxin  0.0625 mg Oral Daily   empagliflozin  10 mg Oral Daily   furosemide  40 mg Oral Daily   metoprolol succinate  50 mg Oral Daily   mometasone-formoterol  2 puff Inhalation BID  pantoprazole (PROTONIX) IV  40 mg Intravenous Q12H   polyethylene glycol  17 g Oral Daily   sacubitril-valsartan  1 tablet Oral BID   senna-docusate  1 tablet Oral BID   spironolactone  25 mg Oral Daily   thiamine  100 mg Oral Daily   varenicline  1 mg Oral Daily    Continuous Infusions:    sodium chloride Stopped (06/15/22 0717)   sodium chloride     amiodarone 60 mg/hr (06/24/22 1220)     LOS: 10 days     Adan Sis, MD,    To contact the attending provider between 7A-7P or the covering provider during after hours 7P-7A, please log into the web site [email or web address] and access using universal [place] password for that web site. If you do not have the password, please call the hospital operator.  06/24/2022, 3:23 PM

## 2022-06-24 NOTE — Progress Notes (Signed)
   ADVANCED HEART FAILURE CONSULT NOTE  Referring Physician: No ref. provider found  Primary Care: Labelle, Megan P, DO Primary Cardiologist: Dr. Arida  Interval hx/Subjective: - No complaints this morning; back in rate controlled atrial fibrillation this morning.    PHYSICAL EXAM: Vitals:   06/24/22 0719 06/24/22 0901  BP: 103/80   Pulse: 80   Resp: 18 16  Temp: 98 F (36.7 C)   SpO2: 100%    GENERAL: Well nourished, well developed, and in no apparent distress at rest.  HEENT: Negative for arcus senilis or xanthelasma. There is no scleral icterus.  The mucous membranes are pink and moist.   NECK: Supple, No masses. Normal carotid upstrokes without bruits. No masses or thyromegaly.    CHEST: There are no chest wall deformities. There is no chest wall tenderness. Respirations are unlabored.  Lungs- cta b/l; no crackles CARDIAC:  JVP: 7cm         Normal S1, S2  tachycardic, irregular. No murmurs, rubs or gallops.  Pulses are 2+ and symmetrical in upper and lower extremities. no edema.  ABDOMEN: Soft, non-tender, non-distended. There are no masses or hepatomegaly. There are normal bowel sounds.  EXTREMITIES: warm with no edema.  LYMPHATIC: No axillary or supraclavicular lymphadenopathy.  NEUROLOGIC: Patient is oriented x3 with no focal or lateralizing neurologic deficits.  PSYCH: Patients affect is appropriate, there is no evidence of anxiety or depression.  SKIN: Warm and dry; no lesions or wounds.   DATA REVIEW  ECG:afib w/ RVR as per my read  ECHO: 07/2016: LVEF 25%-30% 01/2017: LVEF 30-35% 07/2017: LVEF 30-35% 10/22: LVEF 30% 06/16/22: LVEF 20-25% with severely reduced RV function  CATH: RHC/LHC:  1. No significant coronary artery disease. 2. Severely reduced LV systolic function by echo. Left ventricular angiography was not performed.  3. Right heart catheterization showed minimally elevated filling pressures and pulmonary hypertension. Moderately reduced cardiac  output. RA pressure: 12 mmHg, RV pressure: 40 over 6 mmHg, PA pressure 37/12 with a mean of 12 mmHg. Pulmonary capillary wedge pressure was 12. Cardiac output was 4.16 with a cardiac index of 1.77.   ASSESSMENT & PLAN:  Heart failure with reduced EF Etiology of HF:Nonischemic cardiomyopathy by LHC in 2018; has has had persistent atrial fibrillation since 2018 which is possibly the etiology of his HFrEF; can consider CMR at a later date.  NYHA class / AHA Stage:NYHA IIB, improved now Volume status & Diuretics: Euvolemic on exam today, continue lasix 40mg PO daily Vasodilators:Entresto 24/26mg BID, BP at goal. Will continue.  Beta-Blocker:toprol 50mg daily; digoxin .0625mcg daily MRA:spironolactone 25mg daily Cardiometabolic:jardiance 10mg Devices therapies & Valvulopathies:HFrEF since 2018 with LVEF persistently below 30%. Will need EP consult for outpatient ICD evaluation at discharge. Patient has moderate to severe MR by TEE on 06/23/22.  Advanced therapies:Not a candidate currently; I think there are significant hurdles with psychosocial barriers that need to be overcome initially.   2. Atrial fibrillation with RVR - Continue amiodarone gtt until tomorrow. Plan for repeat DCCV tomorrow AM.  3. 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    4. HTN - Well controlled on above regiment.  5. T2DM - Followed by internal medicine team - Jardiance also started.   Patient back in atrial fibrillation; will continue loading with IV amiodarone and plan for cardioversion followed by discharge tomorrow.   Eugene Henderson Advanced Heart Failure Mechanical Circulatory Support  

## 2022-06-25 ENCOUNTER — Inpatient Hospital Stay: Payer: Medicare HMO | Admitting: Anesthesiology

## 2022-06-25 ENCOUNTER — Encounter
Admission: EM | Disposition: A | Discharge status: Home / Self Care | Payer: Self-pay | Source: Home / Self Care | Attending: Internal Medicine

## 2022-06-25 HISTORY — PX: CARDIOVERSION: SHX1299

## 2022-06-25 LAB — CBC
HCT: 38.4 % — ABNORMAL LOW (ref 39.0–52.0)
Hemoglobin: 12.5 g/dL — ABNORMAL LOW (ref 13.0–17.0)
MCH: 28.2 pg (ref 26.0–34.0)
MCHC: 32.6 g/dL (ref 30.0–36.0)
MCV: 86.7 fL (ref 80.0–100.0)
Platelets: 182 10*3/uL (ref 150–400)
RBC: 4.43 MIL/uL (ref 4.22–5.81)
RDW: 14.7 % (ref 11.5–15.5)
WBC: 3.6 10*3/uL — ABNORMAL LOW (ref 4.0–10.5)
nRBC: 0 % (ref 0.0–0.2)

## 2022-06-25 LAB — BASIC METABOLIC PANEL
Anion gap: 5 (ref 5–15)
BUN: 13 mg/dL (ref 8–23)
CO2: 26 mmol/L (ref 22–32)
Calcium: 8.1 mg/dL — ABNORMAL LOW (ref 8.9–10.3)
Chloride: 101 mmol/L (ref 98–111)
Creatinine, Ser: 0.79 mg/dL (ref 0.61–1.24)
GFR, Estimated: >60 mL/min (ref >60)
Glucose, Bld: 103 mg/dL — ABNORMAL HIGH (ref 70–99)
Potassium: 3.8 mmol/L (ref 3.5–5.1)
Sodium: 132 mmol/L — ABNORMAL LOW (ref 135–145)

## 2022-06-25 LAB — GLUCOSE, CAPILLARY: Glucose-Capillary: 115 mg/dL — ABNORMAL HIGH (ref 70–99)

## 2022-06-25 LAB — PROTIME-INR
INR: 1.2 (ref 0.8–1.2)
INR: 1.2 (ref 0.8–1.2)
Prothrombin Time: 15 seconds (ref 11.4–15.2)
Prothrombin Time: 15.4 seconds — ABNORMAL HIGH (ref 11.4–15.2)

## 2022-06-25 LAB — ECHO TEE: Radius: 0.8 cm

## 2022-06-25 SURGERY — CARDIOVERSION
Anesthesia: General

## 2022-06-25 MED ORDER — METOPROLOL SUCCINATE ER 50 MG PO TB24: 50.0000 mg | ORAL_TABLET | Freq: Every day | ORAL | 1 refills | Status: DC

## 2022-06-25 MED ORDER — APIXABAN 5 MG PO TABS: 5.0000 mg | ORAL_TABLET | Freq: Two times a day (BID) | ORAL | 1 refills | Status: DC

## 2022-06-25 MED ORDER — DIGOXIN 62.5 MCG PO TABS: 0.0625 mg | ORAL_TABLET | Freq: Every day | ORAL | 1 refills | Status: DC

## 2022-06-25 MED ORDER — SODIUM CHLORIDE 0.9 % IV SOLN: INTRAVENOUS | Status: DC

## 2022-06-25 MED ORDER — PROPOFOL 10 MG/ML IV BOLUS
INTRAVENOUS | Status: DC | PRN
  Administered 2022-06-25: 30 mg via INTRAVENOUS
  Administered 2022-06-25: 10 mg via INTRAVENOUS
  Administered 2022-06-25: 70 mg via INTRAVENOUS

## 2022-06-25 MED ORDER — AMIODARONE HCL 400 MG PO TABS: ORAL_TABLET | ORAL | 1 refills | Status: DC

## 2022-06-25 MED ORDER — AMIODARONE HCL 200 MG PO TABS
400.0000 mg | ORAL_TABLET | Freq: Two times a day (BID) | ORAL | Status: DC
  Administered 2022-06-25 – 2022-06-26 (×3): 400 mg via ORAL
  Filled 2022-06-25 (×3): qty 2

## 2022-06-25 MED ORDER — ASPIRIN 81 MG PO TBEC: 81.0000 mg | DELAYED_RELEASE_TABLET | Freq: Every day | ORAL | 12 refills | Status: DC

## 2022-06-25 MED ORDER — SACUBITRIL-VALSARTAN 24-26 MG PO TABS: 1.0000 | ORAL_TABLET | Freq: Two times a day (BID) | ORAL | 1 refills | Status: DC

## 2022-06-25 MED ORDER — PROPOFOL 10 MG/ML IV BOLUS
INTRAVENOUS | Status: AC
  Filled 2022-06-25: qty 40

## 2022-06-25 MED ORDER — PANTOPRAZOLE SODIUM 40 MG PO TBEC: 40.0000 mg | DELAYED_RELEASE_TABLET | Freq: Two times a day (BID) | ORAL | 1 refills | Status: DC

## 2022-06-25 NOTE — Discharge Summary (Addendum)
Eugene Henderson LTR:[id number] DOB: 02/14/1951 DOA: 06/14/2022  PCP: Dorcas Carrow, DO  Admit date: 06/14/2022 Discharge date: 06/25/2022  Time spent: 40 minutes  Recommendations for Outpatient Follow-up:  F/u GI F/u advanced heart failure clinic F/u vascular surgery    Discharge Diagnoses:  Principal Problem:   Abdominal pain Active Problems:   Persistent atrial fibrillation (HCC)   Essential hypertension   Tobacco use   Chest pain   Diabetes mellitus without complication (HCC)   Morbid obesity (HCC)   Acute systolic heart failure (HCC)   AF (paroxysmal atrial fibrillation) (HCC)   Hyperlipidemia   Metal foreign body in head   Elevated bilirubin   Atrial fibrillation with RVR (HCC)   Thoracoabdominal aortic aneurysm (TAAA) (HCC)   HFrEF (heart failure with reduced ejection fraction) (HCC)   NICM (nonischemic cardiomyopathy) (HCC)   Pre-operative cardiovascular examination   Discharge Condition: stable  Diet recommendation: heart healthy  Filed Weights   06/17/22 1051 06/17/22 1815 06/22/22 0500  Weight: 122.5 kg 124.3 kg 117.6 kg    History of present illness:  From admission h and p by dr. Sedalia Muta: [name] is a 72 year old male with hypertension, hyperlipidemia, non-insulin-dependent diabetes mellitus, who presents emergency department for chief concerns of chest pain and shortness of breath. At bedside, he is able to tell me his name, age, current calendar year of 2024.    He reports his chest started hurting two days ago. He denies known trauma to his person.   He reports that about one month prior he and a friend were playfully wrestling on the floor. He denies known trauma at the time.   He endorses subjective fever and chills at home. He felt the pain in his chest, in his lower abdomen. He denies dysuria, hematuria. He reports the pain improved with urination therefore he did not present earlier.    He endorses diarrhea. He denies pain with or after  eating.     Hospital Course:  Patient presented with abdominal pain. U/s showed indeterminate material inside the gallbladder. Mrcp showed sludge in the gallbladder, with some wall thickening, no biliary tract obstruction. Patient's pain is primarily low to mid abdominal. Patient was evaluated by GI, they think possible gatritis, deferred EGD, advised 8-12 weeks ppi and outpatient f/u. 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 He will need outpatient f/u with vascular surgery. Patient also found to be in a fib with rvr with exacerbation of hfref. He was treated with IV lasix and he was cardioverted twice, once on 1/17 and again on 1/19. Stable for discharge from their perspective, discharged home on amiodarone and digoxin and apixaban and aspirin and entresto and spiro and metop and lasix. PT evaluated day prior to discharge and no home health needs identified. Patient told me earlier today he was OK with discharge and actually asked me to put in the discharge orders but later in the day he filed a discharge appeal.  Procedures: See above   Consultations: GI, vascular surgery, cardiology  Discharge Exam: Vitals:   06/25/22 1123 06/25/22 1140  BP: 126/81   Pulse: 75   Resp: 20 17  Temp: 97.7 F (36.5 C)   SpO2: 100%     General: NAD Cardiovascular: RRR, soft systolic murmur Respiratory: clear Ab  Discharge Instructions  Discharge Instructions     Diet - low sodium heart healthy   Complete by: As directed    Increase activity slowly   Complete by: As directed    No wound care   Complete by: As directed       Allergies as of 06/25/2022   No Known Allergies      Medication List     STOP taking these medications    erythromycin ophthalmic  ointment   hydroxypropyl methylcellulose / hypromellose 2.5 % ophthalmic solution Commonly known as: ISOPTO TEARS / GONIOVISC   irbesartan 150 MG tablet Commonly known as: AVAPRO   Klor-Con M20 20 MEQ tablet Generic drug: potassium chloride SA   metFORMIN 500 MG 24 hr tablet Commonly known as: GLUCOPHAGE-XR       TAKE these medications    acetaminophen 500 MG tablet Commonly known as: TYLENOL Take 500-1,000 mg by mouth every 6 (six) hours as needed for mild pain, fever or headache.   albuterol 108 (90 Base) MCG/ACT inhaler Commonly known as: VENTOLIN HFA TAKE 2 PUFFS BY MOUTH EVERY 6 HOURS AS NEEDED FOR WHEEZE OR SHORTNESS OF BREATH   amiodarone 400 MG tablet Commonly known as: PACERONE One tab by mouth twice a day for 1 week. After that one tab daily.   apixaban 5 MG Tabs tablet Commonly known as: ELIQUIS Take 1 tablet (5 mg total) by mouth 2 (two) times daily.   aspirin EC 81 MG tablet Take 1 tablet (81 mg total) by mouth daily at 6 (six) AM. Swallow whole. Start taking on: June 26, 2022   atorvastatin 40 MG tablet Commonly known as: LIPITOR TAKE 1 TABLET BY MOUTH EVERY DAY   budesonide-formoterol 160-4.5 MCG/ACT inhaler Commonly known as: SYMBICORT Inhale 2 puffs into the lungs in the morning and at bedtime.   budesonide-formoterol 160-4.5 MCG/ACT inhaler Commonly known as: Symbicort TAKE 2 PUFFS BY MOUTH TWICE A DAY   Digoxin 62.5 MCG Tabs Take 0.0625 mg by mouth daily. Start taking on: June 26, 2022   folic acid 1 MG tablet Commonly known as: FOLVITE TAKE 1 TABLET BY MOUTH EVERY DAY   furosemide 40 MG tablet Commonly known as: LASIX TAKE 1 TABLET BY MOUTH EVERY DAY   hydrocortisone 25 MG suppository Commonly known as: ANUSOL-HC One suppository twice a day as needed for hemmorhoids, (okay to substitute generic)   Jardiance 10 MG Tabs tablet Generic drug: empagliflozin TAKE 1 TABLET BY MOUTH DAILY BEFORE BREAKFAST.   metoprolol succinate  50 MG 24 hr tablet Commonly known as: TOPROL-XL Take 1 tablet (50 mg total) by mouth daily. Take with or immediately following a meal. Start taking on: June 26, 2022 What changed:  medication strength how much to take additional instructions   pantoprazole 40 MG tablet Commonly known as: Protonix Take 1 tablet (40 mg total) by mouth 2 (two) times daily.   sacubitril-valsartan 24-26 MG Commonly known as: ENTRESTO Take 1 tablet by mouth 2 (two) times daily.   spironolactone 25 MG tablet Commonly known as: ALDACTONE TAKE 1 TABLET (25 MG TOTAL) BY MOUTH DAILY.   thiamine 100 MG tablet Commonly known as: VITAMIN B1 Take 1 tablet (100 mg total) by mouth daily.               Durable Medical Equipment  (From admission, onward)           Start     Ordered   06/25/22 1225  For home use only DME Walker rolling  Once  Question Answer Comment  Walker: With 5 Inch Wheels   Patient needs a walker to treat with the following condition HFrEF (heart failure with reduced ejection fraction) (HCC)      06/25/22 1224           No Known Allergies  Follow-up Information     [facility]. Go on 07/02/2022.   Specialty: Cardiology Why: 10:20 AM, [facility], 2nd Floor Contact information: [address] [place] [id number] [phone]        Jaynie CollinsRusso, Steven Michael, DO Follow up.   Specialty: Gastroenterology Contact information: [address] Gastroenterology [place] [address] [phone]         Annice Needyew, Jason S, MD Follow up.   Specialties: Vascular Surgery, Radiology, Interventional Cardiology Contact information: [address] [place] [address] [phone]         Olevia PerchesJohnson, Megan P, DO Follow up.   Specialty: Family Medicine Contact information: [address] [place] [address] [phone]                  The results of  significant diagnostics from this hospitalization (including imaging, microbiology, ancillary and laboratory) are listed below for reference.    Significant Diagnostic Studies: DG Chest Port 1 View  Result Date: 06/24/2022 CLINICAL DATA:  Shortness of breath EXAM: PORTABLE CHEST 1 VIEW COMPARISON:  06/21/2022 FINDINGS: Cardiac shadow is enlarged but stable. The lungs are well aerated bilaterally. No focal infiltrate or effusion is seen. No bony abnormality is noted. IMPRESSION: No active disease. Electronically Signed   By: Alcide CleverMark  Lukens M.D.   On: 06/24/2022 22:57   DG Chest Port 1 View  Result Date: 06/21/2022 CLINICAL DATA:  Shortness of breath. EXAM: PORTABLE CHEST 1 VIEW COMPARISON:  06/14/2022 chest radiograph and CT chest. FINDINGS: Trachea is midline. Heart is enlarged. No airspace consolidation or pleural fluid. IMPRESSION: No acute findings. Electronically Signed   By: Leanna BattlesMelinda  Blietz M.D.   On: 06/21/2022 12:58   PERIPHERAL VASCULAR CATHETERIZATION  Result Date: 06/17/2022 See surgical note for result.  ECHOCARDIOGRAM COMPLETE  Result Date: 06/16/2022    ECHOCARDIOGRAM REPORT   Patient Name:   Launa FlightROBERT Royer Date of Exam: 06/15/2022 Medical Rec #:  [id number]      Height:       72.0 in Accession #:    [id number]     Weight:       274.0 lb Date of Birth:  03/02/1951      BSA:          2.436 m Patient Age:    71 years       BP:           137/108 mmHg Patient Gender: M              HR:           108 bpm. Exam Location:  [facility] Procedure: 2D Echo, Cardiac Doppler and Color Doppler Indications:     I50.21 Acute Systolic CHF  History:         Patient has prior history of Echocardiogram examinations, most                  recent 03/13/2021. Arrythmias:Atrial Fibrillation; Risk                  Factors:Hypertension. Ascending Aortic Aneurysm. Chronic  systolic heart failure. Nonischemic cardiomyopathy.  Sonographer:     Cresenciano Lick RDCS Referring Phys:  [id number] Johnston  Diagnosing Phys: Nelva Bush MD IMPRESSIONS  1. Left ventricular ejection fraction, by estimation, is 20 to 25%. The left ventricle has severely decreased function. The left ventricle demonstrates global hypokinesis. The left ventricular internal cavity size was severely dilated. There is mild left ventricular hypertrophy. Left ventricular diastolic function could not be evaluated.  2. Right ventricular systolic function is severely reduced. The right ventricular size is mildly enlarged. There is moderately elevated pulmonary artery systolic pressure.  3. Left atrial size was severely dilated.  4. Right atrial size was severely dilated.  5. The mitral valve is normal in structure. Moderate mitral valve regurgitation.  6. Tricuspid valve regurgitation is mild to moderate.  7. The aortic valve is tricuspid. Aortic valve regurgitation is trivial. No aortic stenosis is present.  8. Aortic dilatation noted. 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  9. The inferior vena cava is dilated in size with <50% respiratory variability, suggesting right atrial pressure of 15 mmHg. FINDINGS  Left Ventricle: Left ventricular ejection fraction, by estimation, is 20 to 25%. The left ventricle has severely decreased function. The left ventricle demonstrates global hypokinesis. The left ventricular internal cavity size was severely dilated. There is mild left ventricular hypertrophy. Left ventricular diastolic function could not be evaluated due to atrial fibrillation. Left ventricular diastolic function could not be evaluated. Right Ventricle: The right ventricular size is mildly enlarged. No increase in right ventricular wall thickness. Right ventricular systolic function is severely reduced. There is moderately elevated pulmonary artery systolic pressure. The tricuspid regurgitant velocity is 2.95 m/s, and with an assumed right atrial  pressure of 15 mmHg, the estimated right ventricular systolic pressure is 16.6 mmHg. Left Atrium: Left atrial size was severely dilated. Right Atrium: Right atrial size was severely dilated. Pericardium: There is no evidence of pericardial effusion. Mitral Valve: The mitral valve is normal in structure. Moderate mitral valve regurgitation. Tricuspid Valve: The tricuspid valve is normal in structure. Tricuspid valve regurgitation is mild to moderate. Aortic Valve: The aortic valve is tricuspid. Aortic valve regurgitation is trivial. No aortic stenosis is present. Pulmonic Valve: The pulmonic valve was normal in structure. Pulmonic valve regurgitation is trivial. No evidence of pulmonic stenosis. Aorta: Aortic dilatation noted. 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 Pulmonary Artery: The pulmonary artery is not well seen. Venous: The inferior vena cava is dilated in size with less than 50% respiratory variability, suggesting right atrial pressure of 15 mmHg. IAS/Shunts: No atrial level shunt detected by color flow Doppler.  LEFT VENTRICLE PLAX 2D LVIDd:         6.50 cm LVIDs:         5.10 cm LV PW:         1.00 cm LV IVS:        1.00 cm LVOT diam:     2.80 cm LV SV:         44 LV SV Index:   18 LVOT Area:     6.16 cm  RIGHT VENTRICLE             IVC RV Basal diam:  4.60 cm     IVC diam: 3.20 cm RV S prime:     11.27  cm/s LEFT ATRIUM            Index        RIGHT ATRIUM           Index LA diam:      6.20 cm  2.55 cm/m   RA Area:     41.10 cm LA Vol (A2C): 207.0 ml 84.99 ml/m  RA Volume:   171.00 ml 70.21 ml/m LA Vol (A4C): 102.0 ml 41.88 ml/m  AORTIC VALVE LVOT Vmax:   48.90 cm/s LVOT Vmean:  31.460 cm/s LVOT VTI:    0.072 m  AORTA Ao Root diam: 5.27 cm Ao Asc diam:  5.00 cm MITRAL VALVE               TRICUSPID VALVE MV Area (PHT): 5.66 cm    TR Peak grad:   34.8 mmHg MV Decel Time: 134 msec    TR Vmax:        295.00  cm/s MV E velocity: 67.10 cm/s MV A velocity: 41.90 cm/s  SHUNTS MV E/A ratio:  1.60        Systemic VTI:  0.07 m                            Systemic Diam: 2.80 cm Yvonne Kendall MD Electronically signed by Yvonne Kendall MD Signature Date/Time: 06/16/2022/7:15:53 AM    Final    MR ABDOMEN MRCP WO CONTRAST  Result Date: 06/15/2022 CLINICAL DATA:  72 year old male with history of right upper quadrant abdominal pain. Suspected biliary disease. EXAM: MRI ABDOMEN WITHOUT CONTRAST  (INCLUDING MRCP) TECHNIQUE: Multiplanar multisequence MR imaging of the abdomen was performed. Heavily T2-weighted images of the biliary and pancreatic ducts were obtained, and three-dimensional MRCP images were rendered by post processing. COMPARISON:  No priors. FINDINGS: Comment: Today's study is limited by artifact from substantial patient respiratory motion. Additionally, this examination is limited for detection and characterization of visceral and/or vascular lesions by lack of IV gadolinium. Lower chest: Cardiomegaly. Hepatobiliary: Mild diffuse loss of signal intensity throughout the hepatic parenchyma on out of phase dual echo images, indicative of a background of mild hepatic steatosis. In the superior aspect of segment 8 of the liver there is a 11 mm T1 hypointense, T2 hyperintense lesion which is incompletely characterized in the absence of IV gadolinium, but statistically likely a cyst. No other definite suspicious hepatic lesions are noted. MRCP images are nearly nondiagnostic, but demonstrate no intra or extrahepatic biliary ductal dilatation. Common bile duct measures 4 mm in the porta hepatis. No definite filling defects within the common bile duct to suggest choledocholithiasis. There is some amorphous material that is slightly high T1 signal intensity and low T2 signal intensity lying dependently in the gallbladder, likely biliary sludge. Gallbladder wall appears likely mildly thickened and edematous, with trace volume  of pericholecystic fluid (poorly evaluated given motion limitations of today's examination). Pancreas: No definite pancreatic mass identified on today's noncontrast examination. No pancreatic ductal dilatation. Small amount of T2 hyperintensity surrounding the head and uncinate process of the pancreas, as well as the adjacent duodenum. Body and tail of the pancreas are grossly unremarkable in appearance. Spleen:  Unremarkable. Adrenals/Urinary Tract: Bilateral kidneys and adrenal glands are unremarkable in appearance. No hydroureteronephrosis in the visualized portions of the abdomen. Stomach/Bowel: Increased T2 signal intensity surrounding portions of the duodenum. Visualized portions of stomach, small bowel and colon are otherwise unremarkable. Vascular/Lymphatic: Aortic atherosclerosis with aneurysmal dilatation of the suprarenal  abdominal aorta which measures up to 4 cm in diameter at the level of the celiac axis origin. No definite lymphadenopathy noted in the abdomen. Other: Small amount of T2 hyperintensity adjacent to the head of the pancreas extending upwards into the hepatoduodenal ligament, and extending slightly caudally in the adjacent portions of the retroperitoneum. No substantial volume of ascites. Musculoskeletal: Visualized portions are grossly unremarkable. IMPRESSION: 1. Limited study demonstrating biliary sludge in the gallbladder. Gallbladder wall appears thickened and edematous with pericholecystic fluid. There is also fluid adjacent to the head of the pancreas and the duodenal. Whether this reflects an acute cholecystitis or secondary to underlying pancreatitis is uncertain on the basis of today's examination. 2. No definite evidence of choledocholithiasis. No signs of biliary tract obstruction. 3. Mild hepatic steatosis. 4. Aortic atherosclerosis with suprarenal abdominal aortic aneurysm measuring up to 4.0 cm in diameter. Recommend follow-up every 12 months and vascular consultation. This  recommendation follows ACR consensus guidelines: White Paper of the ACR Incidental Findings Committee II on Vascular Findings. J Am Coll Radiol 2013; 10:789-794. Electronically Signed   By: Trudie Reed M.D.   On: 06/15/2022 05:26   MR 3D Recon At Scanner  Result Date: 06/15/2022 CLINICAL DATA:  72 year old male with history of right upper quadrant abdominal pain. Suspected biliary disease. EXAM: MRI ABDOMEN WITHOUT CONTRAST  (INCLUDING MRCP) TECHNIQUE: Multiplanar multisequence MR imaging of the abdomen was performed. Heavily T2-weighted images of the biliary and pancreatic ducts were obtained, and three-dimensional MRCP images were rendered by post processing. COMPARISON:  No priors. FINDINGS: Comment: Today's study is limited by artifact from substantial patient respiratory motion. Additionally, this examination is limited for detection and characterization of visceral and/or vascular lesions by lack of IV gadolinium. Lower chest: Cardiomegaly. Hepatobiliary: Mild diffuse loss of signal intensity throughout the hepatic parenchyma on out of phase dual echo images, indicative of a background of mild hepatic steatosis. In the superior aspect of segment 8 of the liver there is a 11 mm T1 hypointense, T2 hyperintense lesion which is incompletely characterized in the absence of IV gadolinium, but statistically likely a cyst. No other definite suspicious hepatic lesions are noted. MRCP images are nearly nondiagnostic, but demonstrate no intra or extrahepatic biliary ductal dilatation. Common bile duct measures 4 mm in the porta hepatis. No definite filling defects within the common bile duct to suggest choledocholithiasis. There is some amorphous material that is slightly high T1 signal intensity and low T2 signal intensity lying dependently in the gallbladder, likely biliary sludge. Gallbladder wall appears likely mildly thickened and edematous, with trace volume of pericholecystic fluid (poorly evaluated given  motion limitations of today's examination). Pancreas: No definite pancreatic mass identified on today's noncontrast examination. No pancreatic ductal dilatation. Small amount of T2 hyperintensity surrounding the head and uncinate process of the pancreas, as well as the adjacent duodenum. Body and tail of the pancreas are grossly unremarkable in appearance. Spleen:  Unremarkable. Adrenals/Urinary Tract: Bilateral kidneys and adrenal glands are unremarkable in appearance. No hydroureteronephrosis in the visualized portions of the abdomen. Stomach/Bowel: Increased T2 signal intensity surrounding portions of the duodenum. Visualized portions of stomach, small bowel and colon are otherwise unremarkable. Vascular/Lymphatic: Aortic atherosclerosis with aneurysmal dilatation of the suprarenal abdominal aorta which measures up to 4 cm in diameter at the level of the celiac axis origin. No definite lymphadenopathy noted in the abdomen. Other: Small amount of T2 hyperintensity adjacent to the head of the pancreas extending upwards into the hepatoduodenal ligament, and extending slightly caudally in  the adjacent portions of the retroperitoneum. No substantial volume of ascites. Musculoskeletal: Visualized portions are grossly unremarkable. IMPRESSION: 1. Limited study demonstrating biliary sludge in the gallbladder. Gallbladder wall appears thickened and edematous with pericholecystic fluid. There is also fluid adjacent to the head of the pancreas and the duodenal. Whether this reflects an acute cholecystitis or secondary to underlying pancreatitis is uncertain on the basis of today's examination. 2. No definite evidence of choledocholithiasis. No signs of biliary tract obstruction. 3. Mild hepatic steatosis. 4. Aortic atherosclerosis with suprarenal abdominal aortic aneurysm measuring up to 4.0 cm in diameter. Recommend follow-up every 12 months and vascular consultation. This recommendation follows ACR consensus guidelines:  White Paper of the ACR Incidental Findings Committee II on Vascular Findings. J Am Coll Radiol 2013; 10:789-794. Electronically Signed   By: Trudie Reed M.D.   On: 06/15/2022 05:26   DG Cervical Spine 2 or 3 views  Result Date: 06/14/2022 CLINICAL DATA:  MRI clearance EXAM: CERVICAL SPINE - 2-3 VIEW COMPARISON:  None Available. FINDINGS: There are multiple metallic shrapnel remnants noted abutting the right occipital bone measuring up to 10 mm in greatest dimension. Visualized calvarium is intact. Mild degenerative changes are seen within the cervical spine. IMPRESSION: 1. Multiple metallic shrapnel remnants abutting the right occipital bone. Electronically Signed   By: Helyn Numbers M.D.   On: 06/14/2022 22:32   US Abdomen Limited RUQ (LIVER/GB)  Result Date: 06/14/2022 CLINICAL DATA:  Follow-up possible abnormal gallbladder noted on a current CTA chest, abdomen and pelvis. Patient reportedly with chest pain, belching and left-sided abdominal pain. EXAM: ULTRASOUND ABDOMEN LIMITED RIGHT UPPER QUADRANT COMPARISON:  None Available. FINDINGS: Gallbladder: Heterogeneous echogenicity material lies along the wall adjacent to the liver, overall measuring approximately 4 x 1.2 x 2.5 cm. Wall appears thickened, up to 8 mm, although some of this measurement may be including pericholecystic fat. No pericholecystic fluid. No shadowing stone. No sonographic Murphy's sign. Common bile duct: Diameter: 2 mm Liver: Slight nodular contour. Mild increased parenchymal echogenicity. No mass. Portal vein is patent on color Doppler imaging with normal direction of blood flow towards the liver. Other: None. IMPRESSION: 1. Abnormal appearance of the gallbladder. There is apparent material within the gallbladder, non mobile, along the wall adjacent to the liver. This could potentially reflect focal wall edema due to adjacent liver pathology. It could reflect adherent sludge. This material contains internal cystic areas which  argues against neoplasm, although neoplasm not fully excluded. Findings could be further assessed, when the patient can better tolerate the procedure, with liver MRI without and with contrast including MRCP. 2. No pericholecystic fluid, no stones and no sonographic Murphy's sign to indicate acute cholecystitis. 3. Subtle liver surface nodularity. Consider cirrhosis. Increased liver parenchymal echogenicity consistent with hepatic steatosis. Electronically Signed   By: Amie Portland M.D.   On: 06/14/2022 17:35   CT Angio Chest/Abd/Pel for Dissection W and/or Wo Contrast  Result Date: 06/14/2022 CLINICAL DATA:  Chest pain. Loss of belching. Left-sided abdominal pain. Concern for acute aortic syndrome. EXAM: CT ANGIOGRAPHY CHEST, ABDOMEN AND PELVIS TECHNIQUE: Non-contrast CT of the chest was initially obtained. Multidetector CT imaging through the chest, abdomen and pelvis was performed using the standard protocol during bolus administration of intravenous contrast. Multiplanar reconstructed images and MIPs were obtained and reviewed to evaluate the vascular anatomy. RADIATION DOSE REDUCTION: This exam was performed according to the departmental dose-optimization program which includes automated exposure control, adjustment of the mA and/or kV according to patient size and/or use  of iterative reconstruction technique. CONTRAST:  119mL OMNIPAQUE IOHEXOL 350 MG/ML SOLN COMPARISON:  08/04/2021. FINDINGS: CTA CHEST FINDINGS Cardiovascular: Mild-to-moderate cardiac enlargement. Trace pericardial effusion. Three-vessel coronary artery calcifications. Ascending thoracic aorta measures 4.7 cm in diameter. No aortic dissection. Mild aortic atherosclerosis mostly along the mid to lower descending portion. Arch branch vessels are widely patent. Mediastinum/Nodes: No neck base, mediastinal or hilar masses. Scattered prominent lymph nodes. Ascus level right paratracheal node, 1.1 cm short axis. Subcarinal node, 1.5 cm short  axis. Trachea and esophagus are unremarkable. Lungs/Pleura: Mild centrilobular emphysema. Minor linear subsegmental atelectasis, dependent right middle lobe, base of the left upper lobe lingula. No evidence of pneumonia or pulmonary edema. No pleural effusion or pneumothorax. Musculoskeletal: No fracture or acute finding. No chest wall mass. Bilateral gynecomastia. Review of the MIP images confirms the above findings. CTA ABDOMEN AND PELVIS FINDINGS VASCULAR Aorta: Focal fusiform dilation of the proximal aorta, at and just below the level of the renal arteries, maximum diameter of 3.8 cm, associated with eccentric atherosclerotic plaque and mural thrombus, unchanged compared to the prior CT. Moderate atherosclerotic plaque, partly calcified. No dissection. Infrarenal portion dilated to 3 cm. Celiac: Plaque at the origin. No significant stenosis. No aneurysm or dissection. SMA: Mild plaque at the origin. No significant stenosis. No aneurysm or dissection. Renals: Both renal arteries are patent without evidence of aneurysm, dissection, vasculitis, fibromuscular dysplasia or significant stenosis. IMA: Faintly identified, small but patent IMA. Inflow: Minimally opacified common and proximal external iliac vessels. More distally these are not opacified. 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 This is similar to the prior CT. Plaque and narrowing of these vessels cannot be accurately assessed due to limited contrast enhancement. No convincing change from the prior CT, however. Veins: No obvious venous abnormality within the limitations of this arterial phase study. Review of the MIP images confirms the above findings. NON-VASCULAR Hepatobiliary: Overall relative decreased liver attenuation. Subtle nodularity suggested, assessment somewhat limited by patient motion. Liver normal in overall size. No defined mass. Gallbladder margins are indistinct. Haziness in the adjacent fat extending to abut the first and  second portions of the duodenum. No defined stone. No convincing duct dilation. Pancreas: Partial fatty replacement of the pancreas. No mass or duct dilation. No convincing inflammation. Spleen: Normal in size without focal abnormality. Adrenals/Urinary Tract: No adrenal masses. No renal masses, stones or hydronephrosis. Normal ureters. Bladder unremarkable. Stomach/Bowel: Normal stomach. Small bowel and colon are normal in caliber. No wall thickening. No inflammation. Scattered colonic diverticula. Lymphatic: No enlarged lymph nodes. Reproductive: Unremarkable. Other: Anterior hernia mesh. Minimal fat containing umbilical hernia. Appear stable from the prior CT. No ascites. Musculoskeletal: No fracture or acute finding. No bone lesion. Degenerative changes throughout the lumbar spine. Advanced arthropathic changes of the left hip. Review of the MIP images confirms the above findings. IMPRESSION: CTA 1. No acute findings. No aortic dissection, intramural hematoma or convincing penetrating ulcer. No change when compared to the CT from 08/04/2021. 2. Dilated ascending thoracic aorta, prominent descending portion, also stable. Current maximum dimension of the ascending portion, 4.9 cm at the sino-tubular junction, 4.8 cm of the ascending portion at the level of the main pulmonary artery. Ascending thoracic aortic aneurysm. Recommend semi-annual imaging followup by CTA or MRA and referral to cardiothoracic surgery if not already obtained. This recommendation follows 2010 ACCF/AHA/AATS/ACR/ASA/SCA/SCAI/SIR/STS/SVM Guidelines for the Diagnosis and Management of Patients With Thoracic Aortic Disease. Circulation. 2010; 121: T024-O973. Aortic aneurysm NOS (ICD10-I71.9) 3. Focal dilation of the proximal  abdominal aorta to 3.8 cm associated with eccentric mural thrombus, unchanged from the prior CT. Infrarenal portion dilated two 3 cm. 4. 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 NON CTA 1. Gallbladder distended with an  indistinct wall an adjacent hazy fat infiltration. Consider acute cholecystitis in the proper clinical setting, which could be further assessed with limited right upper quadrant ultrasound. No visualized stones. 2. No other evidence of an acute abnormality within the chest, abdomen or pelvis. 3. Multiple chronic findings as detailed above stable compared to the prior CT. Electronically Signed   By: Amie Portland M.D.   On: 06/14/2022 16:09   DG Chest 2 View  Result Date: 06/14/2022 CLINICAL DATA:  Chest pain and shortness of breath EXAM: CHEST - 2 VIEW COMPARISON:  CTA chest dated 08/04/2021, chest radiograph dated 08/24/2019 FINDINGS: Normal lung volumes. No focal consolidations. No pleural effusion or pneumothorax. Enlarged cardiomediastinal silhouette. The visualized skeletal structures are unremarkable. IMPRESSION: 1. No active cardiopulmonary disease. 2. Cardiomegaly. Electronically Signed   By: Agustin Cree M.D.   On: 06/14/2022 13:17    Microbiology: Recent Results (from the past 240 hour(s))  MRSA Next Gen by PCR, Nasal     Status: None   Collection Time: 06/17/22  6:21 PM   Specimen: Nasal Mucosa; Nasal Swab  Result Value Ref Range Status   MRSA by PCR Next Gen NOT DETECTED NOT DETECTED Final    Comment: (NOTE) The GeneXpert MRSA Assay (FDA approved for NASAL specimens only), is one component of a comprehensive MRSA colonization surveillance program. It is not intended to diagnose MRSA infection nor to guide or monitor treatment for MRSA infections. Test performance is not FDA approved in patients less than 84 years old. Performed at [facility], [address] [place], [place] [address]      Labs: Basic Metabolic Panel: Recent Labs  Lab 06/20/22 0343 06/21/22 0436 06/22/22 0610 06/23/22 0402 06/25/22 0613  NA 133* 134* 133* 134* 132*  K 3.3* 3.7 3.7 3.9 3.8  CL 93* 98 100 102 101  CO2 31 25 25 28 26   GLUCOSE 88 92 123* 105* 103*  BUN 10 12 13 11 13   CREATININE  0.72 0.71 0.81 0.70 0.79  CALCIUM 8.2* 8.2* 8.3* 8.4* 8.1*  MG 1.9  --  2.2  --   --   PHOS  --   --   --  3.9  --    Liver Function Tests: Recent Labs  Lab 06/19/22 0415 06/20/22 0343 06/21/22 0436 06/23/22 0402  AST 53* 32 20  --   ALT 95* 68* 50*  --   ALKPHOS 53 52 54  --   BILITOT 1.9* 1.7* 1.7*  --   PROT 6.5 6.5 6.8  --   ALBUMIN 3.3* 3.2* 3.2* 3.1*   No results for input(s): "LIPASE", "AMYLASE" in the last 168 hours. No results for input(s): "AMMONIA" in the last 168 hours. CBC: Recent Labs  Lab 06/19/22 0415 06/20/22 0343 06/21/22 0436 06/25/22 0613  WBC 7.4 6.6 6.1 3.6*  HGB 11.7* 12.0* 12.9* 12.5*  HCT 36.3* 36.9* 38.9* 38.4*  MCV 90.8 90.0 88.6 86.7  PLT 142* 137* 160 182   Cardiac Enzymes: No results for input(s): "CKTOTAL", "CKMB", "CKMBINDEX", "TROPONINI" in the last 168 hours. BNP: BNP (last 3 results) Recent Labs    06/14/22 1321  BNP 575.5*    ProBNP (last 3 results) No results for input(s): "PROBNP" in the last 8760 hours.  CBG: Recent Labs  Lab  06/25/22 0902  GLUCAP 115*       Signed:  Silvano Bilis MD.  [facility] 06/25/2022, 12:27 PM

## 2022-06-25 NOTE — Progress Notes (Signed)
ADVANCED HEART FAILURE CONSULT NOTE  Referring Physician: No ref. provider found  Primary Care: Eugene Roys, DO Primary Cardiologist: Eugene Henderson  Interval hx/Subjective: - No complaints this morning - Cardioversion this AM with conversion to NSR.    PHYSICAL EXAM: Vitals:   06/25/22 1020 06/25/22 1021  BP: 93/61 97/64  Pulse: 66 67  Resp: (!) 21 (!) 22  Temp:    SpO2: 99% 99%   GENERAL: Well nourished, well developed, and in no apparent distress at rest.  HEENT: Negative for arcus senilis or xanthelasma. There is no scleral icterus.  The mucous membranes are pink and moist.   NECK: Supple, No masses. Normal carotid upstrokes without bruits. No masses or thyromegaly.    CHEST: There are no chest wall deformities. There is no chest wall tenderness. Respirations are unlabored.  Lungs- cta b/l; no crackles CARDIAC:  JVP: 8cm         Normal S1, S2  tachycardic, irregular. No murmurs, rubs or gallops.  Pulses are 2+ and symmetrical in upper and lower extremities. no edema.  ABDOMEN: soft nontender no complaints EXTREMITIES: warm with no edema.  LYMPHATIC: No axillary or supraclavicular lymphadenopathy.  NEUROLOGIC: Patient is oriented x3 with no focal or lateralizing neurologic deficits.  PSYCH: Patients affect is appropriate, there is no evidence of anxiety or depression.  SKIN: Warm and dry; no lesions or wounds.   DATA REVIEW  VWU:JWJX w/ RVR as per my read  ECHO: 07/2016: LVEF 25%-30% 01/2017: LVEF 30-35% 07/2017: LVEF 30-35% 10/22: LVEF 30% 06/16/22: LVEF 20-25% with severely reduced RV function  CATH: RHC/LHC:  1. No significant coronary artery disease. 2. Severely reduced LV systolic function by echo. Left ventricular angiography was not performed.  3. Right heart catheterization showed minimally elevated filling pressures and pulmonary hypertension. Moderately reduced cardiac output. RA pressure: 12 mmHg, RV pressure: 40 over 6 mmHg, PA pressure 37/12 with a  mean of 12 mmHg. Pulmonary capillary wedge pressure was 12. Cardiac output was 4.16 with a cardiac index of 1.77.   ASSESSMENT & PLAN:  Heart failure with reduced EF Etiology of BJ:YNWGNFAOZHY cardiomyopathy by LHC in 2018; has had persistent atrial fibrillation since 2018 which is possibly the etiology of his HFrEF; can consider CMR at a later date.  NYHA class / AHA Stage:NYHA IIB, improved now Volume status & Diuretics: Euvolemic on exam today, continue lasix 40mg  PO daily Vasodilators:Entresto 24/26mg  BID, BP at goal. Will continue.  Beta-Blocker:toprol 50mg  daily; digoxin .0628mcg daily QMV:HQIONGEXBMWUXL 25mg  daily Cardiometabolic:jardiance 10mg  Devices therapies & Valvulopathies:HFrEF since 2018 with LVEF persistently below 30%. Will need EP consult for outpatient ICD evaluation at discharge. Patient has moderate to severe MR by TEE on 06/23/22.  Advanced therapies:Not a candidate currently; I think there are significant hurdles with psychosocial barriers that need to be overcome initially.   2. Atrial fibrillation with RVR - Cardioverted today.  - Amiodarone 400mg  BID x 7 days, then 400mg  daily.   3. 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    4. HTN - Well controlled on above regiment.  5. T2DM - Followed by internal medicine team - Jardiance also started.   From a heart failure standpoint, [name] is ready for discharge with the following medications:  Entresto 24/26mg  BID Toprol 50mg  daily Digoxin 0.0617mcg Lasix 40mg  daily (additional 40mg  for weight gain >2-3lb overnight or LE edema) Jardiance 10mg  daily Amiodarone  400mg  BID for 7 days followed by 400mg  daily.   Will arrange follow up with heart failure outpatient.  Eugene Henderson Advanced Heart Failure Mechanical Circulatory Support

## 2022-06-25 NOTE — Progress Notes (Signed)
Physical Therapy Treatment Patient Details Name: Eugene Henderson MRN: [id number] DOB: 1951-05-07 Today's Date: 06/25/2022   History of Present Illness Eugene Henderson is a 24yoM who comes to [facility] on 06/14/22 with CP and SOB. 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 In ED HR in 120-150s in RVR. BNP 575. Workup correlates pain symptoms with suspected cholecystitis, GI consulted then signed off on 1/9 without intervention. 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 Pt has recently been havinh AF/RVR difficulty, went for cardioversion 1/17 with only brief correction, plans for repeat cardioversion 1/19.    PT Comments    Pt in process of appealing DC, received up in chair as typical. Pt continues to express anxiety about lack of closure on several pieces of his many medical issues this admission, the bulk of these lie within topics followed by GI prior to their sign off on 1/9 (gallbladder tests, liver disease, acid reflux). Pt is not fluent in these areas of new diagnosis and combined with his similar pain and SOB with activity as PTA, is concerned about impending death at home. Pt agreeable to demonstrate AMB capacity, uses his new RW in room, AMB 144ft which is farthest he has walked with our services thus far. Author expressed empathy for his frustration, anxiety, and difficulty communicating with MD on these issues. Author encouraged pt to put on paper his primary questions for the doctor to address so that these areas of education can be addressed sufficiently. RN aware of situation. Appears safe for DC to home with mobility, albeit exertional DOE, chest pain, ABD pain have not been adequately explained to patient to his desire.      Recommendations for follow up therapy are one component of a multi-disciplinary discharge planning process, led by the attending physician.  Recommendations may be updated based on  patient status, additional functional criteria and insurance authorization.  Follow Up Recommendations  No PT follow up     Assistance Recommended at Discharge Set up Supervision/Assistance  Patient can return home with the following A little help with walking and/or transfers;Assistance with cooking/housework;Assist for transportation;Help with stairs or ramp for entrance   Equipment Recommendations  Rolling walker (2 wheels)    Recommendations for Other Services       Precautions / Restrictions Precautions Precautions: Fall Restrictions Weight Bearing Restrictions: No     Mobility  Bed Mobility               General bed mobility comments: in chair already, most of day;    Transfers Overall transfer level: Needs assistance Equipment used: None Transfers: Sit to/from Stand Sit to Stand: Supervision                Ambulation/Gait Ambulation/Gait assistance: Supervision Gait Distance (Feet): 150 Feet Assistive device: Rolling walker (2 wheels) Gait Pattern/deviations: WFL(Within Functional Limits)       General Gait Details: trying out his new RW; able to cover more distance, SOB much improved but still partially stirred up.   Stairs             Wheelchair Mobility    Modified Rankin (Stroke Patients Only)       Balance  Cognition Arousal/Alertness: Awake/alert Behavior During Therapy: WFL for tasks assessed/performed Overall Cognitive Status: Within Functional Limits for tasks assessed                                          Exercises      General Comments        Pertinent Vitals/Pain      Home Living                          Prior Function            PT Goals (current goals can now be found in the care plan section) Acute Rehab PT Goals Patient Stated Goal: return to typical mobility regimen PT Goal Formulation: With  patient Time For Goal Achievement: 07/04/22 Potential to Achieve Goals: Fair Progress towards PT goals: Progressing toward goals    Frequency    Min 2X/week      PT Plan Current plan remains appropriate    Co-evaluation              AM-PAC PT "6 Clicks" Mobility   Outcome Measure  Help needed turning from your back to your side while in a flat bed without using bedrails?: None Help needed moving from lying on your back to sitting on the side of a flat bed without using bedrails?: None Help needed moving to and from a bed to a chair (including a wheelchair)?: None Help needed standing up from a chair using your arms (e.g., wheelchair or bedside chair)?: None Help needed to walk in hospital room?: A Little Help needed climbing 3-5 steps with a railing? : A Little 6 Click Score: 22    End of Session Equipment Utilized During Treatment: Gait belt Activity Tolerance: Patient limited by pain;Treatment limited secondary to medical complications (Comment) Patient left: in chair;with call bell/phone within reach Nurse Communication: Mobility status PT Visit Diagnosis: Difficulty in walking, not elsewhere classified (R26.2)     Time: 0962-8366 PT Time Calculation (min) (ACUTE ONLY): 27 min  Charges:  $Therapeutic Activity: 8-22 mins                    4:12 PM, 06/25/22 Eugene Henderson, PT, DPT Physical Therapist - [facility]  [phone] (Peter)   Jasmarie Coppock C 06/25/2022, 4:05 PM

## 2022-06-25 NOTE — Anesthesia Postprocedure Evaluation (Signed)
Anesthesia Post Note  Patient: Eugene Henderson  Procedure(s) Performed: CARDIOVERSION  Patient location during evaluation: Specials Recovery Anesthesia Type: General Level of consciousness: awake and alert Pain management: pain level controlled Vital Signs Assessment: post-procedure vital signs reviewed and stable Respiratory status: spontaneous breathing, nonlabored ventilation, respiratory function stable and patient connected to nasal cannula oxygen Cardiovascular status: blood pressure returned to baseline and stable Postop Assessment: no apparent nausea or vomiting Anesthetic complications: no   No notable events documented.   Last Vitals:  Vitals:   06/25/22 1023 06/25/22 1030  BP: 97/70 103/72  Pulse: 65 61  Resp: 20 18  Temp:    SpO2: 99% 100%    Last Pain:  Vitals:   06/25/22 0926  TempSrc: Oral  PainSc: 6                  Ilene Qua

## 2022-06-25 NOTE — CV Procedure (Signed)
   DIRECT CURRENT CARDIOVERSION  NAME:  Eugene Henderson    MRN: [id number] DOB:  Jun 19, 1950    ADMIT DATE: 06/14/2022  INDICATIONS: Symptomatic atrial fibrillation   CARDIOVERSION:     Indications:  Symptomatic Atrial Fibrillation  Procedure Details:  The patient had the defibrillator pads placed in the anterior and posterior position. Once an appropriate level of sedation was confirmed, the patient was cardioverted x 2 with 200J of biphasic synchronized energy.  The patient converted to NSR.  There were no apparent complications.  The patient had normal neuro status and respiratory status post procedure with vitals stable as recorded elsewhere.  Adequate airway was maintained throughout and vital signs monitored per protocol. Sedation by anesthesia.   [place] Advanced Heart Failure 10:19 AM

## 2022-06-25 NOTE — TOC Progression Note (Signed)
Transition of Care [facility]) - Progression Note    Patient Details  Name: Martino Tompson MRN: [id number] Date of Birth: 08-07-1950  Transition of Care [facility]) CM/SW [place] Phone Number: [phone] 06/25/2022, 3:23 PM   Judithann [facility] Appeal Detailed Notice of Discharge letter created and saved: Yes (Completed by Mariea Clonts, RNCM) Detailed Notice of Discharge Document Given to [place]: Yes (Completed by Mariea Clonts, RNCM) [employer] ROI Document Created: Yes [employer] appeal documents uploaded to [employer] stite: Yes (Upload Confirmation:[id number])   Case ID : [id number]

## 2022-06-25 NOTE — Transfer of Care (Signed)
Immediate Anesthesia Transfer of Care Note  Patient: Eugene Henderson  Procedure(s) Performed: CARDIOVERSION  Patient Location: PACU and Nursing Unit  Anesthesia Type:General  Level of Consciousness: drowsy and patient cooperative  Airway & Oxygen Therapy: Patient Spontanous Breathing and Patient connected to nasal cannula oxygen  Post-op Assessment: Report given to RN and Post -op Vital signs reviewed and stable  Post vital signs: Reviewed and stable  Last Vitals:  Vitals Value Taken Time  BP 97/64 06/25/22 1021  Temp    Pulse 67 06/25/22 1021  Resp 22 06/25/22 1021  SpO2 99 % 06/25/22 1021    Last Pain:  Vitals:   06/25/22 0926  TempSrc: Oral  PainSc: 6       Patients Stated Pain Goal: 0 (67/12/45 8099)  Complications: No notable events documented.

## 2022-06-25 NOTE — TOC Progression Note (Signed)
Transition of Care [facility]) - Progression Note    Patient Details  Name: Eugene Henderson MRN: [id number] Date of Birth: Nov 04, 1950  Transition of Care [facility]) CM/SW Contact  Laurena Slimmer, RN Phone Number: 06/25/2022, 2:07 PM  Clinical Narrative:    Spoke with patient regarding appeal. The follow has been provided: Detailed Notice of Discharge,  Hospital-Issued Notices of Noncoverage.       Expected Discharge Plan and Services         Expected Discharge Date: 06/25/22                                     Social Determinants of Health (SDOH) Interventions SDOH Screenings   Food Insecurity: No Food Insecurity (06/15/2022)  Housing: Low Risk  (06/15/2022)  Transportation Needs: No Transportation Needs (06/15/2022)  Utilities: Not At Risk (06/15/2022)  Depression (PHQ2-9): High Risk (06/18/2021)  Financial Resource Strain: Low Risk  (07/11/2020)  Physical Activity: Insufficiently Active (07/11/2020)  Stress: No Stress Concern Present (07/11/2020)  Tobacco Use: High Risk (06/24/2022)    Readmission Risk Interventions    06/16/2022    3:33 PM  Readmission Risk Prevention Plan  Transportation Screening Complete  HRI or [place] Complete  Social Work Consult for [place] Planning/Counseling Complete  Palliative Care Screening Not Applicable  Medication Review Press photographer) Complete

## 2022-06-25 NOTE — Interval H&P Note (Signed)
History and Physical Interval Note:  06/25/2022 10:05 AM  Traci Sermon  has presented today for surgery, with the diagnosis of atrial fibrillation with rapid ventricular response.  The various methods of treatment have been discussed with the patient and family. After consideration of risks, benefits and other options for treatment, the patient has consented to  Procedure(s): CARDIOVERSION (N/A) as a surgical intervention.  The patient's history has been reviewed, patient examined, no change in status, stable for surgery.  I have reviewed the patient's chart and labs.  Questions were answered to the patient's satisfaction.     Cecia Egge

## 2022-06-25 NOTE — Anesthesia Preprocedure Evaluation (Signed)
Anesthesia Evaluation  Patient identified by MRN, date of birth, ID band Patient awake    Reviewed: Allergy & Precautions, H&P , NPO status , Patient's Chart, lab work & pertinent test results, reviewed documented beta blocker date and time   History of Anesthesia Complications Negative for: history of anesthetic complications  Airway Mallampati: II  TM Distance: >3 FB Neck ROM: full    Dental  (+) Poor Dentition, Dental Advidsory Given, Edentulous Upper, Missing   Pulmonary shortness of breath (from a fib), asthma , neg sleep apnea, COPD,  COPD inhaler, neg recent URI, Current Smoker   Pulmonary exam normal breath sounds clear to auscultation       Cardiovascular Exercise Tolerance: Good hypertension, (-) angina +CHF  (-) Past MI and (-) Cardiac Stents + dysrhythmias Atrial Fibrillation (-) Valvular Problems/Murmurs Rhythm:regular Rate:Normal     Neuro/Psych negative neurological ROS  negative psych ROS   GI/Hepatic negative GI ROS,,,(+) Hepatitis - (Hep C)  Endo/Other  diabetes Hyperthyroidism   Renal/GU negative Renal ROS  negative genitourinary   Musculoskeletal   Abdominal   Peds  Hematology negative hematology ROS (+)   Anesthesia Other Findings Past Medical History: No date: Arthritis No date: Ascending aortic aneurysm (HCC)     Comment:  a. 09/2017 Stable TAA - 5.1cm. No date: Asthma No date: Chronic systolic CHF (congestive heart failure) ([place])     Comment:  a. EF 25-30% by echo in 07/2016 with cath showing no               significant CAD b. 01/2017: EF 30-35% with diffuse HK and              moderate MR; c. 07/2017 Echo: EF 30-35%, diff hK. Mild MR,              mildly dil LA. PASP 69mmHg. No date: Diverticulitis 05/13/2017: Diverticulitis of large intestine with perforation without  abscess or bleeding No date: Hypertension No date: Hyperthyroidism No date: NICM (nonischemic cardiomyopathy)  ([place]) No date: Noncompliance No date: Persistent atrial fibrillation (HCC)     Comment:  a. CHA2DS2VASc = 3-->Eliquis (? compliance).   Reproductive/Obstetrics negative OB ROS                              Anesthesia Physical Anesthesia Plan  ASA: 3  Anesthesia Plan: General   Post-op Pain Management:    Induction: Intravenous  PONV Risk Score and Plan: 1 and Propofol infusion and TIVA  Airway Management Planned: Natural Airway and Nasal Cannula  Additional Equipment:   Intra-op Plan:   Post-operative Plan:   Informed Consent: I have reviewed the patients History and Physical, chart, labs and discussed the procedure including the risks, benefits and alternatives for the proposed anesthesia with the patient or authorized representative who has indicated his/her understanding and acceptance.     Dental Advisory Given  Plan Discussed with: Anesthesiologist, CRNA and Surgeon  Anesthesia Plan Comments: (Patient consented for risks of anesthesia including but not limited to:  - adverse reactions to medications - risk of airway placement if required - damage to eyes, teeth, lips or other oral mucosa - nerve damage due to positioning  - sore throat or hoarseness - Damage to heart, brain, nerves, lungs, other parts of body or loss of life  Patient voiced understanding.)        Anesthesia Quick Evaluation

## 2022-06-26 DIAGNOSIS — K2970 Gastritis, unspecified, without bleeding: Secondary | ICD-10-CM | POA: Diagnosis not present

## 2022-06-26 LAB — CBC
HCT: 37.5 % — ABNORMAL LOW (ref 39.0–52.0)
Hemoglobin: 12.4 g/dL — ABNORMAL LOW (ref 13.0–17.0)
MCH: 28.8 pg (ref 26.0–34.0)
MCHC: 33.1 g/dL (ref 30.0–36.0)
MCV: 87 fL (ref 80.0–100.0)
Platelets: 199 10*3/uL (ref 150–400)
RBC: 4.31 MIL/uL (ref 4.22–5.81)
RDW: 14.9 % (ref 11.5–15.5)
WBC: 4.4 10*3/uL (ref 4.0–10.5)
nRBC: 0 % (ref 0.0–0.2)

## 2022-06-26 LAB — COMPREHENSIVE METABOLIC PANEL
ALT: 22 U/L (ref 0–44)
AST: 16 U/L (ref 15–41)
Albumin: 3.2 g/dL — ABNORMAL LOW (ref 3.5–5.0)
Alkaline Phosphatase: 54 U/L (ref 38–126)
Anion gap: 7 (ref 5–15)
BUN: 14 mg/dL (ref 8–23)
CO2: 24 mmol/L (ref 22–32)
Calcium: 8.3 mg/dL — ABNORMAL LOW (ref 8.9–10.3)
Chloride: 102 mmol/L (ref 98–111)
Creatinine, Ser: 0.77 mg/dL (ref 0.61–1.24)
GFR, Estimated: >60 mL/min (ref >60)
Glucose, Bld: 98 mg/dL (ref 70–99)
Potassium: 3.7 mmol/L (ref 3.5–5.1)
Sodium: 133 mmol/L — ABNORMAL LOW (ref 135–145)
Total Bilirubin: 1.1 mg/dL (ref 0.3–1.2)
Total Protein: 6.9 g/dL (ref 6.5–8.1)

## 2022-06-26 NOTE — Discharge Summary (Addendum)
Eugene Henderson XBJ:[id number] DOB: 30-Mar-1951 DOA: 06/14/2022  PCP: Valerie Roys, DO  Admit date: 06/14/2022 Discharge date: 06/26/2022  Time spent: 40 minutes  Recommendations for Outpatient Follow-up:  F/u GI F/u advanced heart failure clinic F/u vascular surgery    Discharge Diagnoses:  Principal Problem:   Abdominal pain Active Problems:   Persistent atrial fibrillation (HCC)   Essential hypertension   Tobacco use   Chest pain   Diabetes mellitus without complication (HCC)   Morbid obesity ([place])   Acute systolic heart failure (HCC)   AF (paroxysmal atrial fibrillation) (HCC)   Hyperlipidemia   Metal foreign body in head   Elevated bilirubin   Atrial fibrillation with RVR ([place])   Thoracoabdominal aortic aneurysm (TAAA) (HCC)   HFrEF (heart failure with reduced ejection fraction) ([place])   NICM (nonischemic cardiomyopathy) ([place])   Pre-operative cardiovascular examination   Discharge Condition: stable  Diet recommendation: heart healthy  Filed Weights   06/17/22 1051 06/17/22 1815 06/22/22 0500  Weight: 122.5 kg 124.3 kg 117.6 kg    History of present illness:  From admission h and p by dr. Tobie Poet: Eugene Henderson is a 72 year old male with hypertension, hyperlipidemia, non-insulin-dependent diabetes mellitus, who presents emergency department for chief concerns of chest pain and shortness of breath. At bedside, he is able to tell me his name, age, current calendar year of 2024.    He reports his chest started hurting two days ago. He denies known trauma to his person.   He reports that about one month prior he and a friend were playfully wrestling on the floor. He denies known trauma at the time.   He endorses subjective fever and chills at home. He felt the pain in his chest, in his lower abdomen. He denies dysuria, hematuria. He reports the pain improved with urination therefore he did not present earlier.    He endorses diarrhea. He denies pain with or after  eating.     Hospital Course:  Patient presented with abdominal pain. U/s showed indeterminate material inside the gallbladder. Mrcp showed sludge in the gallbladder, with some wall thickening, no biliary tract obstruction. Patient's pain is primarily low to mid abdominal. Patient was evaluated by GI, they think possible gatritis, deferred EGD, advised 8-12 weeks ppi and outpatient f/u. 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 He will need outpatient f/u with vascular surgery. Patient also found to be in a fib with rvr with exacerbation of hfref. He was treated with IV lasix and he was cardioverted twice, once on 1/17 and again on 1/19. Stable for discharge from their perspective, discharged home on amiodarone and digoxin and apixaban and aspirin and entresto and spiro and metop and lasix. PT evaluated day prior to discharge and no home health needs identified. Patient told me earlier today he was OK with discharge and actually asked me to put in the discharge orders but later in the day he filed a discharge appeal.     Abdominal pain: This was his admitting presentation.  Negative workup for choledocholithiasis. GI recommended outpatient follow-up for cirrhosis, twice daily Protonix x 8 to 12 weeks, avoiding NSAIDs, no EGD was recommended and GI signed off.  Overall etiology unclear but wide DD: Gastritis, PUD, passive hepatic congestion from decompensated CHF, AAA etc..  Abdominal pain has  resolved.  Bowel regimen initiated on 06/21/2022 for constipation.   Acute respiratory distress Present on admission.  Suspected due to decompensated CHF.  Could have underlying OSA/OHS based on body habitus.  This will have to be evaluated as outpatient.  Incentive spirometry.  Upon  careful review of chart since admission, no clear hypoxia documented despite patient having dyspnea and tachypnea.  Thereby acute respiratory failure with hypoxia ruled out.  On room air.   Acute on chronic systolic CHF/nonischemic cardiomyopathy: 2D echo 2022: LVEF 30%.  Repeat echo this admission: LVEF 20-25%.  Prior left heart cath with no CAD.  Treated with IV Lasix 40 mg twice daily for a couple of days.  15.9 L thus far and - 1.09 in the last 24 hours.  Cardiology follow-up appreciated.  Following medication changes done by them: Lasix changed to 40 mg p.o. daily, ARB stopped, continue Entresto 24-26 mg twice daily, increased Toprol-XL to 50 Mg daily, continuing Aldactone 25 Mg daily, continue Jardiance 10 Mg daily, continuing Eliquis 5 Mg twice daily along with aspirin 81 Mg daily.  Clinically euvolemic.  Chest x-ray without acute findings.  continue digoxin 125 mcg .   Hypokalemia: Potassium 3.9, continue to replace to try and keep >4.   Persistent A-fib with RVR: Was on IV heparin post endovascular procedures. Was not on anticoagulation PTA.  Cardiology have changed metoprolol to succinate 50 Mg daily and can be titrated up as needed.  Continue Eliquis 5 Mg twice daily, aspirin 81 Mg daily.  1/15, A-fib with RVR up to 170s.  Cardiology initiated digoxin load, IV amiodarone with  TEE/DCCV done on 06/23/2022 patient went back into atrial flutter fibrillation with RVR was given amiodarone bolus 150 mg iv on 06/23/2022.  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 Abdomen CTA revealed several abnormalities 1/8 (see for detailed report).  Following Cardiology preop clearance, patient underwent endovascular treatment of AAA and iliac artery aneurysms by vascular surgery on 1/11.  Follow-up with CT surgery outpatient for evaluation and further repair of aneurysm if  needed.   Delirium Resolved.   Essential hypertension Reasonably controlled.  As noted above, irbesartan discontinued, starting Entresto, changed to Toprol-XL and continuing spironolactone 25 mg daily.     Metal foreign body in head non acute issue.  Outpatient follow-up.   Hyperlipidemia Continued statin.   Thrombocytopenia: Resolved.   Mild transaminitis: Unclear etiology.  Resolved.  Isolated hyperbilirubinemia, stable, unclear etiology.     Today patient states he is ready and willing to go home.  Notes appreciated physical therapy consultation and feels comfortable going home, able to walk around the room and to the bathroom by himself.  States he did not understand that he was going to be treated as an outpatient for his GI issues.  Procedures: See above   Consultations: GI, vascular surgery, cardiology  Discharge Exam: Vitals:   06/26/22 0900 06/26/22 0935  BP:  125/79  Pulse:  78  Resp:  18  Temp:  97.6 F (36.4 C)  SpO2: 100% 100%    General: NAD Cardiovascular: RRR, soft systolic murmur Respiratory: clear Ab  Discharge Instructions   Discharge Instructions     Diet - low sodium heart healthy   Complete by: As directed    Increase activity slowly   Complete by: As directed    No wound care   Complete by: As directed  Allergies as of 06/26/2022   No Known Allergies      Medication List     STOP taking these medications    erythromycin ophthalmic ointment   hydroxypropyl methylcellulose / hypromellose 2.5 % ophthalmic solution Commonly known as: ISOPTO TEARS / GONIOVISC   irbesartan 150 MG tablet Commonly known as: AVAPRO   Klor-Con M20 20 MEQ tablet Generic drug: potassium chloride SA   metFORMIN 500 MG 24 hr tablet Commonly known as: GLUCOPHAGE-XR       TAKE these medications    acetaminophen 500 MG tablet Commonly known as: TYLENOL Take 500-1,000 mg by mouth every 6 (six) hours as needed for mild pain, fever or  headache.   albuterol 108 (90 Base) MCG/ACT inhaler Commonly known as: VENTOLIN HFA TAKE 2 PUFFS BY MOUTH EVERY 6 HOURS AS NEEDED FOR WHEEZE OR SHORTNESS OF BREATH   amiodarone 400 MG tablet Commonly known as: PACERONE One tab by mouth twice a day for 1 week. After that one tab daily.   apixaban 5 MG Tabs tablet Commonly known as: ELIQUIS Take 1 tablet (5 mg total) by mouth 2 (two) times daily.   aspirin EC 81 MG tablet Take 1 tablet (81 mg total) by mouth daily at 6 (six) AM. Swallow whole.   atorvastatin 40 MG tablet Commonly known as: LIPITOR TAKE 1 TABLET BY MOUTH EVERY DAY   budesonide-formoterol 160-4.5 MCG/ACT inhaler Commonly known as: Symbicort TAKE 2 PUFFS BY MOUTH TWICE A DAY What changed: Another medication with the same name was removed. Continue taking this medication, and follow the directions you see here.   Digoxin 62.5 MCG Tabs Take 0.0625 mg by mouth daily.   folic acid 1 MG tablet Commonly known as: FOLVITE TAKE 1 TABLET BY MOUTH EVERY DAY   furosemide 40 MG tablet Commonly known as: LASIX TAKE 1 TABLET BY MOUTH EVERY DAY   hydrocortisone 25 MG suppository Commonly known as: ANUSOL-HC One suppository twice a day as needed for hemmorhoids, (okay to substitute generic)   Jardiance 10 MG Tabs tablet Generic drug: empagliflozin TAKE 1 TABLET BY MOUTH DAILY BEFORE BREAKFAST.   metoprolol succinate 50 MG 24 hr tablet Commonly known as: TOPROL-XL Take 1 tablet (50 mg total) by mouth daily. Take with or immediately following a meal. What changed:  medication strength how much to take additional instructions   pantoprazole 40 MG tablet Commonly known as: Protonix Take 1 tablet (40 mg total) by mouth 2 (two) times daily.   sacubitril-valsartan 24-26 MG Commonly known as: ENTRESTO Take 1 tablet by mouth 2 (two) times daily.   spironolactone 25 MG tablet Commonly known as: ALDACTONE TAKE 1 TABLET (25 MG TOTAL) BY MOUTH DAILY.   thiamine 100 MG  tablet Commonly known as: VITAMIN B1 Take 1 tablet (100 mg total) by mouth daily.               Durable Medical Equipment  (From admission, onward)           Start     Ordered   06/25/22 1225  For home use only DME Walker rolling  Once       Question Answer Comment  Walker: With 5 Inch Wheels   Patient needs a walker to treat with the following condition HFrEF (heart failure with reduced ejection fraction) (HCC)      06/25/22 1224           No Known Allergies  Follow-up Information     [facility]. Go on 07/02/2022.   Specialty: Cardiology Why: 10:20 AM, [facility], [address]nd Floor Contact information: [address] [place] [address] [phone]        Jaynie Collins, DO Follow up.   Specialty: Gastroenterology Contact information: [address] [facility] [place] [address] [phone]         Annice Needy, MD Follow up.   Specialties: Vascular Surgery, Radiology, Interventional Cardiology Contact information: [address] [place] [address] [phone]         Olevia Perches P, DO Follow up.   Specialty: Family Medicine Contact information: [address] [place] [address] [phone]                  The results of significant diagnostics from this hospitalization (including imaging, microbiology, ancillary and laboratory) are listed below for reference.    Significant Diagnostic Studies: ECHO TEE  Result Date: 06/25/2022    TRANSESOPHOGEAL ECHO REPORT   Patient Name:   Eugene Henderson Date of Exam: 06/23/2022 Medical Rec #:  [id number]      Height:       72.0 in Accession #:    [id number]     Weight:       259.3 lb Date of Birth:  25-May-1951      BSA:          2.379 m Patient Age:    71 years       BP:           129/87 mmHg Patient Gender: M              HR:           70 bpm. Exam Location:  [facility] Procedure:  Transesophageal Echo, Color Doppler and Cardiac Doppler Indications:     Atrial Fibrillation I48.91  History:         Patient has prior history of Echocardiogram examinations, most                  recent 06/16/2022. CHF; Risk Factors:Hypertension. Persistent                  Afib.  Sonographer:     Cristela Blue Referring Phys:  [id number] SHERI HAMMOCK Diagnosing Phys: Dorthula Nettles PROCEDURE: The transesophogeal probe was passed without difficulty through the esophogus of the patient. Sedation performed by different physician. The patient developed no complications during the procedure.  IMPRESSIONS  1. Left ventricular ejection fraction, by estimation, is 20 to 25%. The left ventricle has severely decreased function. The left ventricle demonstrates global hypokinesis. The left ventricular internal cavity size was mildly dilated. Left ventricular diastolic function could not be evaluated.  2. Right ventricular systolic function is moderately reduced. The right ventricular size is moderately enlarged. Mildly increased right ventricular wall thickness.  3. Left atrial size was moderately dilated. No left atrial/left atrial appendage thrombus was detected.  4. Right atrial size was severely dilated.  5. There is a very small fibrinous echodensity on the atrial side of the mitral valve measuring 0.9cm; this is unlikely to represent endocarditis. . The mitral valve is normal in structure. Moderate to severe mitral valve regurgitation. No evidence of mitral stenosis.  6. The aortic valve is grossly normal. Aortic valve regurgitation is mild. FINDINGS  Left Ventricle: Left ventricular ejection fraction, by estimation, is 20 to 25%. The left ventricle has severely decreased function. The left ventricle demonstrates global  hypokinesis. The left ventricular internal cavity size was mildly dilated. Left ventricular diastolic function could not be evaluated. Right Ventricle: The right ventricular size is moderately enlarged.  Mildly increased right ventricular wall thickness. Right ventricular systolic function is moderately reduced. Left Atrium: Left atrial size was moderately dilated. No left atrial/left atrial appendage thrombus was detected. Right Atrium: Right atrial size was severely dilated. Pericardium: There is no evidence of pericardial effusion. Mitral Valve: There is a very small fibrinous echodensity on the atrial side of the mitral valve measuring 0.9cm; this is unlikely to represent endocarditis. The mitral valve is normal in structure. Moderate to severe mitral valve regurgitation. No evidence of mitral valve stenosis. Tricuspid Valve: The tricuspid valve is normal in structure. Tricuspid valve regurgitation is mild. Aortic Valve: The aortic valve is grossly normal. Aortic valve regurgitation is mild. Pulmonic Valve: The pulmonic valve was normal in structure. Pulmonic valve regurgitation is not visualized. Aorta: The aortic root was not well visualized. There is minimal (Grade I) plaque. IAS/Shunts: No atrial level shunt detected by color flow Doppler.  MR PISA:        4.02 cm MR PISA Radius: 0.80 cm Aditya Sabharwal Electronically signed by Dorthula Nettles Signature Date/Time: 06/25/2022/2:28:11 PM    Final    DG Chest Port 1 View  Result Date: 06/24/2022 CLINICAL DATA:  Shortness of breath EXAM: PORTABLE CHEST 1 VIEW COMPARISON:  06/21/2022 FINDINGS: Cardiac shadow is enlarged but stable. The lungs are well aerated bilaterally. No focal infiltrate or effusion is seen. No bony abnormality is noted. IMPRESSION: No active disease. Electronically Signed   By: Alcide Clever M.D.   On: 06/24/2022 22:57   DG Chest Port 1 View  Result Date: 06/21/2022 CLINICAL DATA:  Shortness of breath. EXAM: PORTABLE CHEST 1 VIEW COMPARISON:  06/14/2022 chest radiograph and CT chest. FINDINGS: Trachea is midline. Heart is enlarged. No airspace consolidation or pleural fluid. IMPRESSION: No acute findings. Electronically Signed   By:  Leanna Battles M.D.   On: 06/21/2022 12:58   PERIPHERAL VASCULAR CATHETERIZATION  Result Date: 06/17/2022 See surgical note for result.  ECHOCARDIOGRAM COMPLETE  Result Date: 06/16/2022    ECHOCARDIOGRAM REPORT   Patient Name:   Eugene Henderson Date of Exam: 06/15/2022 Medical Rec #:  [id number]      Height:       72.0 in Accession #:    [id number]     Weight:       274.0 lb Date of Birth:  12/30/1950      BSA:          2.436 m Patient Age:    71 years       BP:           137/108 mmHg Patient Gender: M              HR:           108 bpm. Exam Location:  [facility] Procedure: 2D Echo, Cardiac Doppler and Color Doppler Indications:     I50.21 Acute Systolic CHF  History:         Patient has prior history of Echocardiogram examinations, most                  recent 03/13/2021. Arrythmias:Atrial Fibrillation; Risk                  Factors:Hypertension. Ascending Aortic Aneurysm. Chronic                  systolic  heart failure. Nonischemic cardiomyopathy.  Sonographer:     Daphine Deutscher RDCS Referring Phys:  [id number] Raymon Mutton DUNN Diagnosing Phys: Yvonne Kendall MD IMPRESSIONS  1. Left ventricular ejection fraction, by estimation, is 20 to 25%. The left ventricle has severely decreased function. The left ventricle demonstrates global hypokinesis. The left ventricular internal cavity size was severely dilated. There is mild left ventricular hypertrophy. Left ventricular diastolic function could not be evaluated.  2. Right ventricular systolic function is severely reduced. The right ventricular size is mildly enlarged. There is moderately elevated pulmonary artery systolic pressure.  3. Left atrial size was severely dilated.  4. Right atrial size was severely dilated.  5. The mitral valve is normal in structure. Moderate mitral valve regurgitation.  6. Tricuspid valve regurgitation is mild to moderate.  7. The aortic valve is tricuspid. Aortic valve regurgitation is trivial. No aortic stenosis is present.  8. Aortic  dilatation noted. 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  9. The inferior vena cava is dilated in size with <50% respiratory variability, suggesting right atrial pressure of 15 mmHg. FINDINGS  Left Ventricle: Left ventricular ejection fraction, by estimation, is 20 to 25%. The left ventricle has severely decreased function. The left ventricle demonstrates global hypokinesis. The left ventricular internal cavity size was severely dilated. There is mild left ventricular hypertrophy. Left ventricular diastolic function could not be evaluated due to atrial fibrillation. Left ventricular diastolic function could not be evaluated. Right Ventricle: The right ventricular size is mildly enlarged. No increase in right ventricular wall thickness. Right ventricular systolic function is severely reduced. There is moderately elevated pulmonary artery systolic pressure. The tricuspid regurgitant velocity is 2.95 m/s, and with an assumed right atrial pressure of 15 mmHg, the estimated right ventricular systolic pressure is 49.8 mmHg. Left Atrium: Left atrial size was severely dilated. Right Atrium: Right atrial size was severely dilated. Pericardium: There is no evidence of pericardial effusion. Mitral Valve: The mitral valve is normal in structure. Moderate mitral valve regurgitation. Tricuspid Valve: The tricuspid valve is normal in structure. Tricuspid valve regurgitation is mild to moderate. Aortic Valve: The aortic valve is tricuspid. Aortic valve regurgitation is trivial. No aortic stenosis is present. Pulmonic Valve: The pulmonic valve was normal in structure. Pulmonic valve regurgitation is trivial. No evidence of pulmonic stenosis. Aorta: Aortic dilatation noted. 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 Pulmonary Artery: The pulmonary artery  is not well seen. Venous: The inferior vena cava is dilated in size with less than 50% respiratory variability, suggesting right atrial pressure of 15 mmHg. IAS/Shunts: No atrial level shunt detected by color flow Doppler.  LEFT VENTRICLE PLAX 2D LVIDd:         6.50 cm LVIDs:         5.10 cm LV PW:         1.00 cm LV IVS:        1.00 cm LVOT diam:     2.80 cm LV SV:         44 LV SV Index:   18 LVOT Area:     6.16 cm  RIGHT VENTRICLE             IVC RV Basal diam:  4.60 cm     IVC diam: 3.20 cm RV S prime:     11.27 cm/s  LEFT ATRIUM            Index        RIGHT ATRIUM           Index LA diam:      6.20 cm  2.55 cm/m   RA Area:     41.10 cm LA Vol (A2C): 207.0 ml 84.99 ml/m  RA Volume:   171.00 ml 70.21 ml/m LA Vol (A4C): 102.0 ml 41.88 ml/m  AORTIC VALVE LVOT Vmax:   48.90 cm/s LVOT Vmean:  31.460 cm/s LVOT VTI:    0.072 m  AORTA Ao Root diam: 5.27 cm Ao Asc diam:  5.00 cm MITRAL VALVE               TRICUSPID VALVE MV Area (PHT): 5.66 cm    TR Peak grad:   34.8 mmHg MV Decel Time: 134 msec    TR Vmax:        295.00 cm/s MV E velocity: 67.10 cm/s MV A velocity: 41.90 cm/s  SHUNTS MV E/A ratio:  1.60        Systemic VTI:  0.07 m                            Systemic Diam: 2.80 cm Yvonne Kendall MD Electronically signed by Yvonne Kendall MD Signature Date/Time: 06/16/2022/7:15:53 AM    Final    MR ABDOMEN MRCP WO CONTRAST  Result Date: 06/15/2022 CLINICAL DATA:  72 year old male with history of right upper quadrant abdominal pain. Suspected biliary disease. EXAM: MRI ABDOMEN WITHOUT CONTRAST  (INCLUDING MRCP) TECHNIQUE: Multiplanar multisequence MR imaging of the abdomen was performed. Heavily T2-weighted images of the biliary and pancreatic ducts were obtained, and three-dimensional MRCP images were rendered by post processing. COMPARISON:  No priors. FINDINGS: Comment: Today's study is limited by artifact from substantial patient respiratory motion. Additionally, this examination is limited for detection  and characterization of visceral and/or vascular lesions by lack of IV gadolinium. Lower chest: Cardiomegaly. Hepatobiliary: Mild diffuse loss of signal intensity throughout the hepatic parenchyma on out of phase dual echo images, indicative of a background of mild hepatic steatosis. In the superior aspect of segment 8 of the liver there is a 11 mm T1 hypointense, T2 hyperintense lesion which is incompletely characterized in the absence of IV gadolinium, but statistically likely a cyst. No other definite suspicious hepatic lesions are noted. MRCP images are nearly nondiagnostic, but demonstrate no intra or extrahepatic biliary ductal dilatation. Common bile duct measures 4 mm in the porta hepatis. No definite filling defects within the common bile duct to suggest choledocholithiasis. There is some amorphous material that is slightly high T1 signal intensity and low T2 signal intensity lying dependently in the gallbladder, likely biliary sludge. Gallbladder wall appears likely mildly thickened and edematous, with trace volume of pericholecystic fluid (poorly evaluated given motion limitations of today's examination). Pancreas: No definite pancreatic mass identified on today's noncontrast examination. No pancreatic ductal dilatation. Small amount of T2 hyperintensity surrounding the head and uncinate process of the pancreas, as well as the adjacent duodenum. Body and tail of the pancreas are grossly unremarkable in appearance. Spleen:  Unremarkable. Adrenals/Urinary Tract: Bilateral kidneys and adrenal glands are unremarkable in appearance. No hydroureteronephrosis in the visualized portions of the abdomen. Stomach/Bowel: Increased T2 signal intensity surrounding portions of the duodenum. Visualized portions of stomach, small bowel and colon are otherwise unremarkable. Vascular/Lymphatic: Aortic atherosclerosis with aneurysmal dilatation of the suprarenal abdominal aorta  which measures up to 4 cm in diameter at the  level of the celiac axis origin. No definite lymphadenopathy noted in the abdomen. Other: Small amount of T2 hyperintensity adjacent to the head of the pancreas extending upwards into the hepatoduodenal ligament, and extending slightly caudally in the adjacent portions of the retroperitoneum. No substantial volume of ascites. Musculoskeletal: Visualized portions are grossly unremarkable. IMPRESSION: 1. Limited study demonstrating biliary sludge in the gallbladder. Gallbladder wall appears thickened and edematous with pericholecystic fluid. There is also fluid adjacent to the head of the pancreas and the duodenal. Whether this reflects an acute cholecystitis or secondary to underlying pancreatitis is uncertain on the basis of today's examination. 2. No definite evidence of choledocholithiasis. No signs of biliary tract obstruction. 3. Mild hepatic steatosis. 4. Aortic atherosclerosis with suprarenal abdominal aortic aneurysm measuring up to 4.0 cm in diameter. Recommend follow-up every 12 months and vascular consultation. This recommendation follows ACR consensus guidelines: White Paper of the ACR Incidental Findings Committee II on Vascular Findings. J Am Coll Radiol 2013; 10:789-794. Electronically Signed   By: Trudie Reed M.D.   On: 06/15/2022 05:26   MR 3D Recon At Scanner  Result Date: 06/15/2022 CLINICAL DATA:  72 year old male with history of right upper quadrant abdominal pain. Suspected biliary disease. EXAM: MRI ABDOMEN WITHOUT CONTRAST  (INCLUDING MRCP) TECHNIQUE: Multiplanar multisequence MR imaging of the abdomen was performed. Heavily T2-weighted images of the biliary and pancreatic ducts were obtained, and three-dimensional MRCP images were rendered by post processing. COMPARISON:  No priors. FINDINGS: Comment: Today's study is limited by artifact from substantial patient respiratory motion. Additionally, this examination is limited for detection and characterization of visceral and/or vascular  lesions by lack of IV gadolinium. Lower chest: Cardiomegaly. Hepatobiliary: Mild diffuse loss of signal intensity throughout the hepatic parenchyma on out of phase dual echo images, indicative of a background of mild hepatic steatosis. In the superior aspect of segment 8 of the liver there is a 11 mm T1 hypointense, T2 hyperintense lesion which is incompletely characterized in the absence of IV gadolinium, but statistically likely a cyst. No other definite suspicious hepatic lesions are noted. MRCP images are nearly nondiagnostic, but demonstrate no intra or extrahepatic biliary ductal dilatation. Common bile duct measures 4 mm in the porta hepatis. No definite filling defects within the common bile duct to suggest choledocholithiasis. There is some amorphous material that is slightly high T1 signal intensity and low T2 signal intensity lying dependently in the gallbladder, likely biliary sludge. Gallbladder wall appears likely mildly thickened and edematous, with trace volume of pericholecystic fluid (poorly evaluated given motion limitations of today's examination). Pancreas: No definite pancreatic mass identified on today's noncontrast examination. No pancreatic ductal dilatation. Small amount of T2 hyperintensity surrounding the head and uncinate process of the pancreas, as well as the adjacent duodenum. Body and tail of the pancreas are grossly unremarkable in appearance. Spleen:  Unremarkable. Adrenals/Urinary Tract: Bilateral kidneys and adrenal glands are unremarkable in appearance. No hydroureteronephrosis in the visualized portions of the abdomen. Stomach/Bowel: Increased T2 signal intensity surrounding portions of the duodenum. Visualized portions of stomach, small bowel and colon are otherwise unremarkable. Vascular/Lymphatic: Aortic atherosclerosis with aneurysmal dilatation of the suprarenal abdominal aorta which measures up to 4 cm in diameter at the level of the celiac axis origin. No definite  lymphadenopathy noted in the abdomen. Other: Small amount of T2 hyperintensity adjacent to the head of the pancreas extending upwards into the hepatoduodenal ligament, and extending slightly caudally in the  adjacent portions of the retroperitoneum. No substantial volume of ascites. Musculoskeletal: Visualized portions are grossly unremarkable. IMPRESSION: 1. Limited study demonstrating biliary sludge in the gallbladder. Gallbladder wall appears thickened and edematous with pericholecystic fluid. There is also fluid adjacent to the head of the pancreas and the duodenal. Whether this reflects an acute cholecystitis or secondary to underlying pancreatitis is uncertain on the basis of today's examination. 2. No definite evidence of choledocholithiasis. No signs of biliary tract obstruction. 3. Mild hepatic steatosis. 4. Aortic atherosclerosis with suprarenal abdominal aortic aneurysm measuring up to 4.0 cm in diameter. Recommend follow-up every 12 months and vascular consultation. This recommendation follows ACR consensus guidelines: White Paper of the ACR Incidental Findings Committee II on Vascular Findings. J Am Coll Radiol 2013; 10:789-794. Electronically Signed   By: Trudie Reed M.D.   On: 06/15/2022 05:26   DG Cervical Spine 2 or 3 views  Result Date: 06/14/2022 CLINICAL DATA:  MRI clearance EXAM: CERVICAL SPINE - 2-3 VIEW COMPARISON:  None Available. FINDINGS: There are multiple metallic shrapnel remnants noted abutting the right occipital bone measuring up to 10 mm in greatest dimension. Visualized calvarium is intact. Mild degenerative changes are seen within the cervical spine. IMPRESSION: 1. Multiple metallic shrapnel remnants abutting the right occipital bone. Electronically Signed   By: Helyn Numbers M.D.   On: 06/14/2022 22:32   US Abdomen Limited RUQ (LIVER/GB)  Result Date: 06/14/2022 CLINICAL DATA:  Follow-up possible abnormal gallbladder noted on a current CTA chest, abdomen and pelvis.  Patient reportedly with chest pain, belching and left-sided abdominal pain. EXAM: ULTRASOUND ABDOMEN LIMITED RIGHT UPPER QUADRANT COMPARISON:  None Available. FINDINGS: Gallbladder: Heterogeneous echogenicity material lies along the wall adjacent to the liver, overall measuring approximately 4 x 1.2 x 2.5 cm. Wall appears thickened, up to 8 mm, although some of this measurement may be including pericholecystic fat. No pericholecystic fluid. No shadowing stone. No sonographic Murphy's sign. Common bile duct: Diameter: 2 mm Liver: Slight nodular contour. Mild increased parenchymal echogenicity. No mass. Portal vein is patent on color Doppler imaging with normal direction of blood flow towards the liver. Other: None. IMPRESSION: 1. Abnormal appearance of the gallbladder. There is apparent material within the gallbladder, non mobile, along the wall adjacent to the liver. This could potentially reflect focal wall edema due to adjacent liver pathology. It could reflect adherent sludge. This material contains internal cystic areas which argues against neoplasm, although neoplasm not fully excluded. Findings could be further assessed, when the patient can better tolerate the procedure, with liver MRI without and with contrast including MRCP. 2. No pericholecystic fluid, no stones and no sonographic Murphy's sign to indicate acute cholecystitis. 3. Subtle liver surface nodularity. Consider cirrhosis. Increased liver parenchymal echogenicity consistent with hepatic steatosis. Electronically Signed   By: Amie Portland M.D.   On: 06/14/2022 17:35   CT Angio Chest/Abd/Pel for Dissection W and/or Wo Contrast  Result Date: 06/14/2022 CLINICAL DATA:  Chest pain. Loss of belching. Left-sided abdominal pain. Concern for acute aortic syndrome. EXAM: CT ANGIOGRAPHY CHEST, ABDOMEN AND PELVIS TECHNIQUE: Non-contrast CT of the chest was initially obtained. Multidetector CT imaging through the chest, abdomen and pelvis was performed  using the standard protocol during bolus administration of intravenous contrast. Multiplanar reconstructed images and MIPs were obtained and reviewed to evaluate the vascular anatomy. RADIATION DOSE REDUCTION: This exam was performed according to the departmental dose-optimization program which includes automated exposure control, adjustment of the mA and/or kV according to patient size and/or use of  iterative reconstruction technique. CONTRAST:  100mL OMNIPAQUE IOHEXOL 350 MG/ML SOLN COMPARISON:  08/04/2021. FINDINGS: CTA CHEST FINDINGS Cardiovascular: Mild-to-moderate cardiac enlargement. Trace pericardial effusion. Three-vessel coronary artery calcifications. Ascending thoracic aorta measures 4.7 cm in diameter. No aortic dissection. Mild aortic atherosclerosis mostly along the mid to lower descending portion. Arch branch vessels are widely patent. Mediastinum/Nodes: No neck base, mediastinal or hilar masses. Scattered prominent lymph nodes. Ascus level right paratracheal node, 1.1 cm short axis. Subcarinal node, 1.5 cm short axis. Trachea and esophagus are unremarkable. Lungs/Pleura: Mild centrilobular emphysema. Minor linear subsegmental atelectasis, dependent right middle lobe, base of the left upper lobe lingula. No evidence of pneumonia or pulmonary edema. No pleural effusion or pneumothorax. Musculoskeletal: No fracture or acute finding. No chest wall mass. Bilateral gynecomastia. Review of the MIP images confirms the above findings. CTA ABDOMEN AND PELVIS FINDINGS VASCULAR Aorta: Focal fusiform dilation of the proximal aorta, at and just below the level of the renal arteries, maximum diameter of 3.8 cm, associated with eccentric atherosclerotic plaque and mural thrombus, unchanged compared to the prior CT. Moderate atherosclerotic plaque, partly calcified. No dissection. Infrarenal portion dilated to 3 cm. Celiac: Plaque at the origin. No significant stenosis. No aneurysm or dissection. SMA: Mild plaque at  the origin. No significant stenosis. No aneurysm or dissection. Renals: Both renal arteries are patent without evidence of aneurysm, dissection, vasculitis, fibromuscular dysplasia or significant stenosis. IMA: Faintly identified, small but patent IMA. Inflow: Minimally opacified common and proximal external iliac vessels. More distally these are not opacified. 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 This is similar to the prior CT. Plaque and narrowing of these vessels cannot be accurately assessed due to limited contrast enhancement. No convincing change from the prior CT, however. Veins: No obvious venous abnormality within the limitations of this arterial phase study. Review of the MIP images confirms the above findings. NON-VASCULAR Hepatobiliary: Overall relative decreased liver attenuation. Subtle nodularity suggested, assessment somewhat limited by patient motion. Liver normal in overall size. No defined mass. Gallbladder margins are indistinct. Haziness in the adjacent fat extending to abut the first and second portions of the duodenum. No defined stone. No convincing duct dilation. Pancreas: Partial fatty replacement of the pancreas. No mass or duct dilation. No convincing inflammation. Spleen: Normal in size without focal abnormality. Adrenals/Urinary Tract: No adrenal masses. No renal masses, stones or hydronephrosis. Normal ureters. Bladder unremarkable. Stomach/Bowel: Normal stomach. Small bowel and colon are normal in caliber. No wall thickening. No inflammation. Scattered colonic diverticula. Lymphatic: No enlarged lymph nodes. Reproductive: Unremarkable. Other: Anterior hernia mesh. Minimal fat containing umbilical hernia. Appear stable from the prior CT. No ascites. Musculoskeletal: No fracture or acute finding. No bone lesion. Degenerative changes throughout the lumbar spine. Advanced arthropathic changes of the left hip. Review of the MIP images confirms the above findings.  IMPRESSION: CTA 1. No acute findings. No aortic dissection, intramural hematoma or convincing penetrating ulcer. No change when compared to the CT from 08/04/2021. 2. Dilated ascending thoracic aorta, prominent descending portion, also stable. Current maximum dimension of the ascending portion, 4.9 cm at the sino-tubular junction, 4.8 cm of the ascending portion at the level of the main pulmonary artery. Ascending thoracic aortic aneurysm. Recommend semi-annual imaging followup by CTA or MRA and referral to cardiothoracic surgery if not already obtained. This recommendation follows 2010 ACCF/AHA/AATS/ACR/ASA/SCA/SCAI/SIR/STS/SVM Guidelines for the Diagnosis and Management of Patients With Thoracic Aortic Disease. Circulation. 2010; 121: Z610-R604: E266-e369. Aortic aneurysm NOS (ICD10-I71.9) 3. Focal dilation of the proximal abdominal  aorta to 3.8 cm associated with eccentric mural thrombus, unchanged from the prior CT. Infrarenal portion dilated two 3 cm. 4. 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 NON CTA 1. Gallbladder distended with an indistinct wall an adjacent hazy fat infiltration. Consider acute cholecystitis in the proper clinical setting, which could be further assessed with limited right upper quadrant ultrasound. No visualized stones. 2. No other evidence of an acute abnormality within the chest, abdomen or pelvis. 3. Multiple chronic findings as detailed above stable compared to the prior CT. Electronically Signed   By: Lajean Manes M.D.   On: 06/14/2022 16:09   DG Chest 2 View  Result Date: 06/14/2022 CLINICAL DATA:  Chest pain and shortness of breath EXAM: CHEST - 2 VIEW COMPARISON:  CTA chest dated 08/04/2021, chest radiograph dated 08/24/2019 FINDINGS: Normal lung volumes. No focal consolidations. No pleural effusion or pneumothorax. Enlarged cardiomediastinal silhouette. The visualized skeletal structures are unremarkable. IMPRESSION: 1. No active cardiopulmonary disease. 2. Cardiomegaly.  Electronically Signed   By: Darrin Nipper M.D.   On: 06/14/2022 13:17    Microbiology: Recent Results (from the past 240 hour(s))  MRSA Next Gen by PCR, Nasal     Status: None   Collection Time: 06/17/22  6:21 PM   Specimen: Nasal Mucosa; Nasal Swab  Result Value Ref Range Status   MRSA by PCR Next Gen NOT DETECTED NOT DETECTED Final    Comment: (NOTE) The GeneXpert MRSA Assay (FDA approved for NASAL specimens only), is one component of a comprehensive MRSA colonization surveillance program. It is not intended to diagnose MRSA infection nor to guide or monitor treatment for MRSA infections. Test performance is not FDA approved in patients less than 31 years old. Performed at [facility], [address] [place], [place] [address]      Labs: Basic Metabolic Panel: Recent Labs  Lab 06/20/22 0343 06/21/22 0436 06/22/22 0610 06/23/22 0402 06/25/22 0613 06/26/22 0511  NA 133* 134* 133* 134* 132* 133*  K 3.3* 3.7 3.7 3.9 3.8 3.7  CL 93* 98 100 102 101 102  CO2 31 25 25 28 26 24   GLUCOSE 88 92 123* 105* 103* 98  BUN 10 12 13 11 13 14   CREATININE 0.72 0.71 0.81 0.70 0.79 0.77  CALCIUM 8.2* 8.2* 8.3* 8.4* 8.1* 8.3*  MG 1.9  --  2.2  --   --   --   PHOS  --   --   --  3.9  --   --     Liver Function Tests: Recent Labs  Lab 06/20/22 0343 06/21/22 0436 06/23/22 0402 06/26/22 0511  AST 32 20  --  16  ALT 68* 50*  --  22  ALKPHOS 52 54  --  54  BILITOT 1.7* 1.7*  --  1.1  PROT 6.5 6.8  --  6.9  ALBUMIN 3.2* 3.2* 3.1* 3.2*    No results for input(s): "LIPASE", "AMYLASE" in the last 168 hours. No results for input(s): "AMMONIA" in the last 168 hours. CBC: Recent Labs  Lab 06/20/22 0343 06/21/22 0436 06/25/22 0613 06/26/22 0511  WBC 6.6 6.1 3.6* 4.4  HGB 12.0* 12.9* 12.5* 12.4*  HCT 36.9* 38.9* 38.4* 37.5*  MCV 90.0 88.6 86.7 87.0  PLT 137* 160 182 199    Cardiac Enzymes: No results for input(s): "CKTOTAL", "CKMB", "CKMBINDEX", "TROPONINI" in the last  168 hours. BNP: BNP (last 3 results) Recent Labs    06/14/22 1321  BNP 575.5*     ProBNP (  last 3 results) No results for input(s): "PROBNP" in the last 8760 hours.  CBG: Recent Labs  Lab 06/25/22 0902  GLUCAP 115*        Signed:  Pieter Partridge MD.  [facility] 06/26/2022, 11:14 AM

## 2022-06-26 NOTE — TOC Progression Note (Signed)
Transition of Care [facility]) - Progression Note    Patient Details  Name: Eugene Henderson MRN: [id number] Date of Birth: 1951-02-06  Transition of Care [facility]) CM/SW [place], RN Phone Number: 06/26/2022, 10:42 AM  Clinical Narrative:     Appeal remains under clinical review.       Expected Discharge Plan and Services         Expected Discharge Date: 06/25/22                                     Social Determinants of Health (SDOH) Interventions SDOH Screenings   Food Insecurity: No Food Insecurity (06/15/2022)  Housing: Low Risk  (06/15/2022)  Transportation Needs: No Transportation Needs (06/15/2022)  Utilities: Not At Risk (06/15/2022)  Depression (PHQ2-9): High Risk (06/18/2021)  Financial Resource Strain: Low Risk  (07/11/2020)  Physical Activity: Insufficiently Active (07/11/2020)  Stress: No Stress Concern Present (07/11/2020)  Tobacco Use: High Risk (06/24/2022)    Readmission Risk Interventions    06/16/2022    3:33 PM  Readmission Risk Prevention Plan  Transportation Screening Complete  HRI or [place] Complete  Social Work Consult for [place] Planning/Counseling Complete  Palliative Care Screening Not Applicable  Medication Review Press photographer) Complete

## 2022-06-28 ENCOUNTER — Encounter: Payer: Self-pay | Admitting: Cardiology

## 2022-06-28 ENCOUNTER — Telehealth: Payer: Self-pay | Admitting: *Deleted

## 2022-06-28 ENCOUNTER — Other Ambulatory Visit: Payer: Self-pay | Admitting: Obstetrics and Gynecology

## 2022-06-28 LAB — GLUCOSE, CAPILLARY: Glucose-Capillary: 123 mg/dL — ABNORMAL HIGH (ref 70–99)

## 2022-06-28 NOTE — Patient Outreach (Signed)
  Care Coordination TOC Note Transition Care Management Follow-up Telephone Call Date of discharge and from where: [phone] [facility] chest pain How have you been since you were released from the hospital? Doing good Any questions or concerns? Yes  Items Reviewed: Did the pt receive and understand the discharge instructions provided? Yes  Medications obtained and verified? Yes  medications reviewed Other? Yes  Patient metformin was discontinued. Not indicated  reason. Patient daughter will follow up with Dr Wynetta Emery on [phone] appointment Any new allergies since your discharge? No  Dietary orders reviewed? Yes low sodium heart healthy diet Do you have support at home? Yes   Home Care and Equipment/Supplies: Were home health services ordered? no If so, what is the name of the agency? N/a  Has the agency set up a time to come to the patient's home? not applicable Were any new equipment or medical supplies ordered?  No What is the name of the medical supply agency? N/a Were you able to get the supplies/equipment? not applicable Do you have any questions related to the use of the equipment or supplies? No  Functional Questionnaire: (I = Independent and D = Dependent) ADLs: I  Bathing/Dressing- I  Meal Prep- D  Eating- I  Maintaining continence- I  Transferring/Ambulation- I  Managing Meds- I  Follow up appointments reviewed:  PCP Hospital f/u appt confirmed? Yes  Scheduled to see Dr Wynetta Emery [phone] 3:40 [facility] f/u appt confirmed? Yes  Scheduled to see Heart Failure clinic [phone] 10:20 . Are transportation arrangements needed?  RN gave  patient daughter the [facility] transportation number for future references if needed If their condition worsens, is the pt aware to call PCP or go to the Emergency Dept.? Yes Was the patient provided with contact information for the PCP's office or ED? Yes Was to pt encouraged to call back with questions or concerns? Yes  SDOH  assessments and interventions completed:   Yes SDOH Interventions Today    Flowsheet Row Most Recent Value  SDOH Interventions   Food Insecurity Interventions Intervention Not Indicated  Housing Interventions Intervention Not Indicated  Transportation Interventions Patient Resources (Friends/Family)  [RN gave the daughter the number for [employer] transportation. He may need transportation in the future.]       Care Coordination Interventions:  RN gave daughter the [facility] transportation for future reference in case transportation problems,    Encounter Outcome:  Pt. Visit Completed    [facility] [phone]

## 2022-06-29 ENCOUNTER — Encounter: Payer: Self-pay | Admitting: Family Medicine

## 2022-06-29 ENCOUNTER — Ambulatory Visit (INDEPENDENT_AMBULATORY_CARE_PROVIDER_SITE_OTHER): Payer: Medicare HMO | Admitting: Family Medicine

## 2022-06-29 VITALS — BP 134/71 | HR 78 | Temp 98.3°F | Ht 72.0 in | Wt 261.5 lb

## 2022-06-29 DIAGNOSIS — R1030 Lower abdominal pain, unspecified: Secondary | ICD-10-CM

## 2022-06-29 DIAGNOSIS — K2900 Acute gastritis without bleeding: Secondary | ICD-10-CM | POA: Diagnosis not present

## 2022-06-29 DIAGNOSIS — I7161 Supraceliac aneurysm of the thoracoabdominal aorta, without rupture: Secondary | ICD-10-CM | POA: Diagnosis not present

## 2022-06-29 DIAGNOSIS — I700 Atherosclerosis of aorta: Secondary | ICD-10-CM

## 2022-06-29 DIAGNOSIS — I480 Paroxysmal atrial fibrillation: Secondary | ICD-10-CM | POA: Diagnosis not present

## 2022-06-29 DIAGNOSIS — I5020 Unspecified systolic (congestive) heart failure: Secondary | ICD-10-CM | POA: Diagnosis not present

## 2022-06-29 DIAGNOSIS — Z23 Encounter for immunization: Secondary | ICD-10-CM | POA: Diagnosis not present

## 2022-06-29 DIAGNOSIS — R3911 Hesitancy of micturition: Secondary | ICD-10-CM

## 2022-06-29 DIAGNOSIS — I4819 Other persistent atrial fibrillation: Secondary | ICD-10-CM

## 2022-06-29 DIAGNOSIS — E0590 Thyrotoxicosis, unspecified without thyrotoxic crisis or storm: Secondary | ICD-10-CM | POA: Diagnosis not present

## 2022-06-29 DIAGNOSIS — R0603 Acute respiratory distress: Secondary | ICD-10-CM

## 2022-06-29 DIAGNOSIS — I10 Essential (primary) hypertension: Secondary | ICD-10-CM

## 2022-06-29 DIAGNOSIS — I428 Other cardiomyopathies: Secondary | ICD-10-CM

## 2022-06-29 DIAGNOSIS — I714 Abdominal aortic aneurysm, without rupture, unspecified: Secondary | ICD-10-CM | POA: Diagnosis not present

## 2022-06-29 DIAGNOSIS — E1159 Type 2 diabetes mellitus with other circulatory complications: Secondary | ICD-10-CM | POA: Diagnosis not present

## 2022-06-29 DIAGNOSIS — J42 Unspecified chronic bronchitis: Secondary | ICD-10-CM

## 2022-06-29 DIAGNOSIS — I4891 Unspecified atrial fibrillation: Secondary | ICD-10-CM

## 2022-06-29 DIAGNOSIS — E785 Hyperlipidemia, unspecified: Secondary | ICD-10-CM

## 2022-06-29 LAB — URINALYSIS, ROUTINE W REFLEX MICROSCOPIC
Bilirubin, UA: NEGATIVE
Ketones, UA: NEGATIVE
Leukocytes,UA: NEGATIVE
Nitrite, UA: NEGATIVE
Protein,UA: NEGATIVE
RBC, UA: NEGATIVE
Specific Gravity, UA: <1.005 — ABNORMAL LOW (ref 1.005–1.030)
Urobilinogen, Ur: 0.2 mg/dL (ref 0.2–1.0)
pH, UA: 5 (ref 5.0–7.5)

## 2022-06-29 LAB — BAYER DCA HB A1C WAIVED: HB A1C (BAYER DCA - WAIVED): 6.9 % — ABNORMAL HIGH (ref 4.8–5.6)

## 2022-06-29 LAB — MICROALBUMIN, URINE WAIVED
Creatinine, Urine Waived: 50 mg/dL (ref 10–300)
Microalb, Ur Waived: 10 mg/L (ref 0–19)
Microalb/Creat Ratio: <30 mg/g (ref <30)

## 2022-06-29 NOTE — Progress Notes (Signed)
BP 134/71   Pulse 78   Temp 98.3 F (36.8 C) (Oral)   Ht 6' (1.829 m)   Wt 261 lb 8 oz (118.6 kg)   SpO2 99%   BMI 35.47 kg/m    Subjective:    Patient ID: Eugene Henderson, male    DOB: 12/15/1950, 72 y.o.   MRN: [id number]  HPI: Eugene Henderson is a 72 y.o. male who presents today for hospitalization follow up after being lost to follow up for over a year.   Chief Complaint  Patient presents with   Hospitalization Follow-up   Transition of Care Hospital Follow up.   Hospital/Facility: [facility] D/C Physician: Dr. Luberta Robertson D/C Date: 06/26/22   Records Requested: 06/29/22  Records Received: 06/29/22  Records Reviewed: 06/29/22   Diagnoses on Discharge:  Abdominal pain   Persistent atrial fibrillation (HCC)   Essential hypertension   Tobacco use   Chest pain   Diabetes mellitus without complication (HCC)   Morbid obesity (HCC)   Acute systolic heart failure (HCC)   AF (paroxysmal atrial fibrillation) (HCC)   Hyperlipidemia   Metal foreign body in head   Elevated bilirubin   Atrial fibrillation with RVR (HCC)   Thoracoabdominal aortic aneurysm (TAAA) (HCC)   HFrEF (heart failure with reduced ejection fraction) (HCC)   NICM (nonischemic cardiomyopathy) (HCC)   Pre-operative cardiovascular examination   Date of interactive Contact within 48 hours of discharge: 06/28/22 Contact was through: phone  Date of 7 day or 14 day face-to-face visit: 06/29/22   within 7 days  Outpatient Encounter Medications as of 06/29/2022  Medication Sig   acetaminophen (TYLENOL) 500 MG tablet Take 500-1,000 mg by mouth every 6 (six) hours as needed for mild pain, fever or headache.    apixaban (ELIQUIS) 5 MG TABS tablet Take 1 tablet (5 mg total) by mouth 2 (two) times daily.   atorvastatin (LIPITOR) 40 MG tablet TAKE 1 TABLET BY MOUTH EVERY DAY   digoxin 62.5 MCG TABS Take 0.0625 mg by mouth daily.   furosemide (LASIX) 40 MG tablet TAKE 1 TABLET BY MOUTH EVERY DAY   hydrocortisone (ANUSOL-HC)  25 MG suppository One suppository twice a day as needed for hemmorhoids, (okay to substitute generic) (Patient not taking: Reported on 07/02/2022)   JARDIANCE 10 MG TABS tablet TAKE 1 TABLET BY MOUTH DAILY BEFORE BREAKFAST.   metFORMIN (GLUCOPHAGE) 500 MG tablet Take 1 tablet (500 mg total) by mouth 2 (two) times daily with a meal.   metoprolol succinate (TOPROL-XL) 50 MG 24 hr tablet Take 1 tablet (50 mg total) by mouth daily. Take with or immediately following a meal.   nicotine (NICODERM CQ) 7 mg/24hr patch Place 1 patch (7 mg total) onto the skin daily.   pantoprazole (PROTONIX) 40 MG tablet Take 1 tablet (40 mg total) by mouth 2 (two) times daily.   spironolactone (ALDACTONE) 25 MG tablet TAKE 1 TABLET (25 MG TOTAL) BY MOUTH DAILY.   thiamine 100 MG tablet Take 1 tablet (100 mg total) by mouth daily.   [DISCONTINUED] albuterol (VENTOLIN HFA) 108 (90 Base) MCG/ACT inhaler TAKE 2 PUFFS BY MOUTH EVERY 6 HOURS AS NEEDED FOR WHEEZE OR SHORTNESS OF BREATH   [DISCONTINUED] amiodarone (PACERONE) 400 MG tablet One tab by mouth twice a day for 1 week. After that one tab daily.   [DISCONTINUED] budesonide-formoterol (SYMBICORT) 160-4.5 MCG/ACT inhaler TAKE 2 PUFFS BY MOUTH TWICE A DAY   [DISCONTINUED] sacubitril-valsartan (ENTRESTO) 24-26 MG Take 1 tablet by mouth 2 (two) times daily.  aspirin EC 81 MG tablet Take 1 tablet (81 mg total) by mouth daily at 6 (six) AM. Swallow whole.   budesonide-formoterol (SYMBICORT) 160-4.5 MCG/ACT inhaler TAKE 2 PUFFS BY MOUTH TWICE A DAY   [DISCONTINUED] aspirin EC 81 MG tablet Take 1 tablet (81 mg total) by mouth daily at 6 (six) AM. Swallow whole.   [DISCONTINUED] folic acid (FOLVITE) 1 MG tablet TAKE 1 TABLET BY MOUTH EVERY DAY (Patient not taking: Reported on 06/14/2022)   No facility-administered encounter medications on file as of 06/29/2022.  Per Hospitalist: "History of present illness:  From admission h and p by dr. Sedalia Muta: [name] is a 72 year old male  with hypertension, hyperlipidemia, non-insulin-dependent diabetes mellitus, who presents emergency department for chief concerns of chest pain and shortness of breath. At bedside, he is able to tell me his name, age, current calendar year of 2024.    He reports his chest started hurting two days ago. He denies known trauma to his person.   He reports that about one month prior he and a friend were playfully wrestling on the floor. He denies known trauma at the time.   He endorses subjective fever and chills at home. He felt the pain in his chest, in his lower abdomen. He denies dysuria, hematuria. He reports the pain improved with urination therefore he did not present earlier.    He endorses diarrhea. He denies pain with or after eating.       Hospital Course:  Patient presented with abdominal pain. U/s showed indeterminate material inside the gallbladder. Mrcp showed sludge in the gallbladder, with some wall thickening, no biliary tract obstruction. Patient's pain is primarily low to mid abdominal. Patient was evaluated by GI, they think possible gatritis, deferred EGD, advised 8-12 weeks ppi and outpatient f/u. 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 He will need outpatient f/u with vascular surgery. Patient also found to be in a fib with rvr with exacerbation of hfref. He was treated with IV lasix and he was cardioverted twice, once on 1/17 and again on 1/19. Stable for discharge from their perspective, discharged home on amiodarone and digoxin and apixaban and aspirin and entresto and spiro and metop and lasix. PT evaluated day prior to discharge and no home health needs identified. Patient told me earlier today he was OK with discharge and actually asked me to put  in the discharge orders but later in the day he filed a discharge appeal.      Abdominal pain: This was his admitting presentation.  Negative workup for choledocholithiasis. GI recommended outpatient follow-up for cirrhosis, twice daily Protonix x 8 to 12 weeks, avoiding NSAIDs, no EGD was recommended and GI signed off.  Overall etiology unclear but wide DD: Gastritis, PUD, passive hepatic congestion from decompensated CHF, AAA etc..  Abdominal pain has resolved.  Bowel regimen initiated on 06/21/2022 for constipation.   Acute respiratory distress Present on admission.  Suspected due to decompensated CHF.  Could have underlying OSA/OHS based on body habitus.  This will have to be evaluated as outpatient.  Incentive spirometry.  Upon careful review of chart since admission, no clear hypoxia documented despite patient having dyspnea and tachypnea.  Thereby acute respiratory failure with hypoxia ruled out.  On room air.   Acute on chronic systolic CHF/nonischemic  cardiomyopathy: 2D echo 2022: LVEF 30%.  Repeat echo this admission: LVEF 20-25%.  Prior left heart cath with no CAD.  Treated with IV Lasix 40 mg twice daily for a couple of days.  15.9 L thus far and - 1.09 in the last 24 hours.  Cardiology follow-up appreciated.  Following medication changes done by them: Lasix changed to 40 mg p.o. daily, ARB stopped, continue Entresto 24-26 mg twice daily, increased Toprol-XL to 50 Mg daily, continuing Aldactone 25 Mg daily, continue Jardiance 10 Mg daily, continuing Eliquis 5 Mg twice daily along with aspirin 81 Mg daily.  Clinically euvolemic.  Chest x-ray without acute findings.  continue digoxin 125 mcg .   Hypokalemia: Potassium 3.9, continue to replace to try and keep >4.   Persistent A-fib with RVR: Was on IV heparin post endovascular procedures. Was not on anticoagulation PTA.  Cardiology have changed metoprolol to succinate 50 Mg daily and can be titrated up as needed.  Continue Eliquis 5 Mg twice  daily, aspirin 81 Mg daily.  1/15, A-fib with RVR up to 170s.  Cardiology initiated digoxin load, IV amiodarone with  TEE/DCCV done on 06/23/2022 patient went back into atrial flutter fibrillation with RVR was given amiodarone bolus 150 mg iv on 06/23/2022.  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 Abdomen CTA revealed several abnormalities 1/8 (see for detailed report).  Following Cardiology preop clearance, patient underwent endovascular treatment of AAA and iliac artery aneurysms by vascular surgery on 1/11.  Follow-up with CT surgery outpatient for evaluation and further repair of aneurysm if needed.   Delirium Resolved.   Essential hypertension Reasonably controlled.  As noted above, irbesartan discontinued, starting Entresto, changed to Toprol-XL and continuing spironolactone 25 mg daily.     Metal foreign body in head non acute issue.  Outpatient follow-up.   Hyperlipidemia Continued statin.   Thrombocytopenia: Resolved.   Mild transaminitis: Unclear etiology.  Resolved.  Isolated hyperbilirubinemia, stable, unclear etiology."  Diagnostic Tests Reviewed:  Narrative & Impression  CLINICAL DATA:  Chest pain and shortness of breath   EXAM: CHEST - 2 VIEW   COMPARISON:  CTA chest dated 08/04/2021, chest radiograph dated 08/24/2019   FINDINGS: Normal lung volumes. No focal consolidations. No pleural effusion or pneumothorax. Enlarged cardiomediastinal silhouette. The visualized skeletal structures are unremarkable.   IMPRESSION: 1. No active cardiopulmonary disease. 2. Cardiomegaly.  CLINICAL DATA:  Chest pain. Loss of belching. Left-sided abdominal pain. Concern for acute aortic syndrome.   EXAM: CT ANGIOGRAPHY CHEST, ABDOMEN AND PELVIS   TECHNIQUE: Non-contrast CT of the chest was initially obtained.   Multidetector CT imaging  through the chest, abdomen and pelvis was performed using the standard protocol during bolus administration of intravenous contrast. Multiplanar reconstructed images and MIPs were obtained and reviewed to evaluate the vascular anatomy.   RADIATION DOSE REDUCTION: This exam was performed according to the departmental dose-optimization program which includes automated exposure control, adjustment of the mA and/or kV according to patient size and/or use of iterative reconstruction technique.   CONTRAST:  OMNIPAQUE IOHEXOL 350 MG/ML SOLN   COMPARISON:  08/04/2021.   FINDINGS: CTA CHEST FINDINGS   Cardiovascular: Mild-to-moderate cardiac enlargement. Trace pericardial effusion. Three-vessel coronary artery calcifications.   Ascending thoracic aorta measures 4.7 cm in diameter. No aortic dissection. Mild aortic atherosclerosis mostly along the mid to lower descending portion. Arch branch vessels are widely patent.  Mediastinum/Nodes: No neck base, mediastinal or hilar masses. Scattered prominent lymph nodes. Ascus level right paratracheal node, 1.1 cm short axis. Subcarinal node, 1.5 cm short axis. Trachea and esophagus are unremarkable.   Lungs/Pleura: Mild centrilobular emphysema. Minor linear subsegmental atelectasis, dependent right middle lobe, base of the left upper lobe lingula. No evidence of pneumonia or pulmonary edema. No pleural effusion or pneumothorax.   Musculoskeletal: No fracture or acute finding. No chest wall mass. Bilateral gynecomastia.   Review of the MIP images confirms the above findings.   CTA ABDOMEN AND PELVIS FINDINGS   VASCULAR   Aorta: Focal fusiform dilation of the proximal aorta, at and just below the level of the renal arteries, maximum diameter of 3.8 cm, associated with eccentric atherosclerotic plaque and mural thrombus, unchanged compared to the prior CT. Moderate atherosclerotic plaque, partly calcified. No dissection. Infrarenal  portion dilated to 3 cm.   Celiac: Plaque at the origin. No significant stenosis. No aneurysm or dissection.   SMA: Mild plaque at the origin. No significant stenosis. No aneurysm or dissection.   Renals: Both renal arteries are patent without evidence of aneurysm, dissection, vasculitis, fibromuscular dysplasia or significant stenosis.   IMA: Faintly identified, small but patent IMA.   Inflow: Minimally opacified common and proximal external iliac vessels. More distally these are not opacified. 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 This is similar to the prior CT. Plaque and narrowing of these vessels cannot be accurately assessed due to limited contrast enhancement. No convincing change from the prior CT, however.   Veins: No obvious venous abnormality within the limitations of this arterial phase study.   Review of the MIP images confirms the above findings.   NON-VASCULAR   Hepatobiliary: Overall relative decreased liver attenuation. Subtle nodularity suggested, assessment somewhat limited by patient motion. Liver normal in overall size. No defined mass. Gallbladder margins are indistinct. Haziness in the adjacent fat extending to abut the first and second portions of the duodenum. No defined stone. No convincing duct dilation.   Pancreas: Partial fatty replacement of the pancreas. No mass or duct dilation. No convincing inflammation.   Spleen: Normal in size without focal abnormality.   Adrenals/Urinary Tract: No adrenal masses. No renal masses, stones or hydronephrosis. Normal ureters. Bladder unremarkable.   Stomach/Bowel: Normal stomach. Small bowel and colon are normal in caliber. No wall thickening. No inflammation. Scattered colonic diverticula.   Lymphatic: No enlarged lymph nodes.   Reproductive: Unremarkable.   Other: Anterior hernia mesh. Minimal fat containing umbilical hernia. Appear stable from the prior CT. No ascites.    Musculoskeletal: No fracture or acute finding. No bone lesion. Degenerative changes throughout the lumbar spine. Advanced arthropathic changes of the left hip.   Review of the MIP images confirms the above findings.   IMPRESSION: CTA   1. No acute findings. No aortic dissection, intramural hematoma or convincing penetrating ulcer. No change when compared to the CT from 08/04/2021. 2. Dilated ascending thoracic aorta, prominent descending portion, also stable. Current maximum dimension of the ascending portion, 4.9 cm at the sino-tubular junction, 4.8 cm of the ascending portion at the level of the main pulmonary artery. Ascending thoracic aortic aneurysm. Recommend semi-annual imaging followup by CTA or MRA and referral to cardiothoracic surgery if not already obtained. This recommendation follows 2010 ACCF/AHA/AATS/ACR/ASA/SCA/SCAI/SIR/STS/SVM Guidelines for the Diagnosis and Management of Patients With Thoracic Aortic Disease. Circulation. 2010; 121: Z610-R604: E266-e369. Aortic aneurysm NOS (ICD10-I71.9) 3. Focal dilation of the proximal abdominal aorta to 3.8 cm associated  with eccentric mural thrombus, unchanged from the prior CT. Infrarenal portion dilated two 3 cm. 4. 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   NON CTA   1. Gallbladder distended with an indistinct wall an adjacent hazy fat infiltration. Consider acute cholecystitis in the proper clinical setting, which could be further assessed with limited right upper quadrant ultrasound. No visualized stones. 2. No other evidence of an acute abnormality within the chest, abdomen or pelvis. 3. Multiple chronic findings as detailed above stable compared to the prior CT. CLINICAL DATA:  Follow-up possible abnormal gallbladder noted on a current CTA chest, abdomen and pelvis. Patient reportedly with chest pain, belching and left-sided abdominal pain.   EXAM: ULTRASOUND ABDOMEN LIMITED RIGHT UPPER QUADRANT   COMPARISON:   None Available.   FINDINGS: Gallbladder:   Heterogeneous echogenicity material lies along the wall adjacent to the liver, overall measuring approximately 4 x 1.2 x 2.5 cm. Wall appears thickened, up to 8 mm, although some of this measurement may be including pericholecystic fat. No pericholecystic fluid. No shadowing stone. No sonographic Murphy's sign.   Common bile duct:   Diameter: 2 mm   Liver:   Slight nodular contour. Mild increased parenchymal echogenicity. No mass. Portal vein is patent on color Doppler imaging with normal direction of blood flow towards the liver.   Other: None.   IMPRESSION: 1. Abnormal appearance of the gallbladder. There is apparent material within the gallbladder, non mobile, along the wall adjacent to the liver. This could potentially reflect focal wall edema due to adjacent liver pathology. It could reflect adherent sludge. This material contains internal cystic areas which argues against neoplasm, although neoplasm not fully excluded. Findings could be further assessed, when the patient can better tolerate the procedure, with liver MRI without and with contrast including MRCP. 2. No pericholecystic fluid, no stones and no sonographic Murphy's sign to indicate acute cholecystitis. 3. Subtle liver surface nodularity. Consider cirrhosis. Increased liver parenchymal echogenicity consistent with hepatic steatosis.  CLINICAL DATA:  MRI clearance   EXAM: CERVICAL SPINE - 2-3 VIEW   COMPARISON:  None Available.   FINDINGS: There are multiple metallic shrapnel remnants noted abutting the right occipital bone measuring up to 10 mm in greatest dimension. Visualized calvarium is intact. Mild degenerative changes are seen within the cervical spine.   IMPRESSION: 1. Multiple metallic shrapnel remnants abutting the right occipital bone.  CLINICAL DATA:  72 year old male with history of right upper quadrant abdominal pain. Suspected biliary  disease.   EXAM: MRI ABDOMEN WITHOUT CONTRAST  (INCLUDING MRCP)   TECHNIQUE: Multiplanar multisequence MR imaging of the abdomen was performed. Heavily T2-weighted images of the biliary and pancreatic ducts were obtained, and three-dimensional MRCP images were rendered by post processing.   COMPARISON:  No priors.   FINDINGS: Comment: Today's study is limited by artifact from substantial patient respiratory motion. Additionally, this examination is limited for detection and characterization of visceral and/or vascular lesions by lack of IV gadolinium.   Lower chest: Cardiomegaly.   Hepatobiliary: Mild diffuse loss of signal intensity throughout the hepatic parenchyma on out of phase dual echo images, indicative of a background of mild hepatic steatosis. In the superior aspect of segment 8 of the liver there is a 11 mm T1 hypointense, T2 hyperintense lesion which is incompletely characterized in the absence of IV gadolinium, but statistically likely a cyst. No other definite suspicious hepatic lesions are noted. MRCP images are nearly nondiagnostic, but demonstrate no intra or extrahepatic biliary ductal dilatation. Common bile  duct measures 4 mm in the porta hepatis. No definite filling defects within the common bile duct to suggest choledocholithiasis. There is some amorphous material that is slightly high T1 signal intensity and low T2 signal intensity lying dependently in the gallbladder, likely biliary sludge. Gallbladder wall appears likely mildly thickened and edematous, with trace volume of pericholecystic fluid (poorly evaluated given motion limitations of today's examination).   Pancreas: No definite pancreatic mass identified on today's noncontrast examination. No pancreatic ductal dilatation. Small amount of T2 hyperintensity surrounding the head and uncinate process of the pancreas, as well as the adjacent duodenum. Body and tail of the pancreas are grossly  unremarkable in appearance.   Spleen:  Unremarkable.   Adrenals/Urinary Tract: Bilateral kidneys and adrenal glands are unremarkable in appearance. No hydroureteronephrosis in the visualized portions of the abdomen.   Stomach/Bowel: Increased T2 signal intensity surrounding portions of the duodenum. Visualized portions of stomach, small bowel and colon are otherwise unremarkable.   Vascular/Lymphatic: Aortic atherosclerosis with aneurysmal dilatation of the suprarenal abdominal aorta which measures up to 4 cm in diameter at the level of the celiac axis origin. No definite lymphadenopathy noted in the abdomen.   Other: Small amount of T2 hyperintensity adjacent to the head of the pancreas extending upwards into the hepatoduodenal ligament, and extending slightly caudally in the adjacent portions of the retroperitoneum. No substantial volume of ascites.   Musculoskeletal: Visualized portions are grossly unremarkable.   IMPRESSION: 1. Limited study demonstrating biliary sludge in the gallbladder. Gallbladder wall appears thickened and edematous with pericholecystic fluid. There is also fluid adjacent to the head of the pancreas and the duodenal. Whether this reflects an acute cholecystitis or secondary to underlying pancreatitis is uncertain on the basis of today's examination. 2. No definite evidence of choledocholithiasis. No signs of biliary tract obstruction. 3. Mild hepatic steatosis. 4. Aortic atherosclerosis with suprarenal abdominal aortic aneurysm measuring up to 4.0 cm in diameter. Recommend follow-up every 12 months and vascular consultation. This recommendation follows ACR consensus guidelines: White Paper of the ACR Incidental Findings Committee II on Vascular Findings. J Am Coll Radiol 2013; 10:789-794.  CLINICAL DATA:  Shortness of breath.   EXAM: PORTABLE CHEST 1 VIEW   COMPARISON:  06/14/2022 chest radiograph and CT chest.   FINDINGS: Trachea is midline.  Heart is enlarged. No airspace consolidation or pleural fluid.   IMPRESSION: No acute findings.  CLINICAL DATA:  Shortness of breath   EXAM: PORTABLE CHEST 1 VIEW   COMPARISON:  06/21/2022   FINDINGS: Cardiac shadow is enlarged but stable. The lungs are well aerated bilaterally. No focal infiltrate or effusion is seen. No bony abnormality is noted.   IMPRESSION: No active disease.  Disposition: Home  Consults: GI, vascular surgery, cardiology   Discharge Instructions: F/u GI F/u advanced heart failure clinic F/u vascular surgery  Disease/illness Education: Discussed today  Home Health/Community Services Discussions/Referrals: N/A  Establishment or re-establishment of referral orders for community resources:  N/A  Discussion with other health care providers: None  Assessment and Support of treatment regimen adherence: Guarded  Appointments Coordinated with: Patient and daughter  Education for self-management, independent living, and ADLs: Discussed today  Since getting out of the hospital, Hilberto has been doing OK. He is feeling a lot better. He is not having any belly pain. He has his chronic back pain. He has been having some issues with getting short winded. He would like to get some help at home and he would like home health to try to  help him get stronger. He is otherwise feeling well. No other concerns or complaints at this time.   SMOKING CESSATION Smoking Status: current everyday smoker Smoking Amount: 2-4 cig/day Smoking Onset: as a teenager Smoking Quit Date: not set Smoking triggers: stress Type of tobacco use: cigarettes Children in the house: yes Other household members who smoke: no Treatments attempted: cold [place] Pneumovax: up to date   Relevant past medical, surgical, family and social history reviewed and updated as indicated. Interim medical history since our last visit reviewed. Allergies and medications reviewed and updated.  Review  of Systems  Constitutional: Negative.   Respiratory:  Positive for shortness of breath. Negative for apnea, cough, choking, chest tightness, wheezing and stridor.   Cardiovascular: Negative.   Gastrointestinal: Negative.   Musculoskeletal:  Positive for back pain. Negative for arthralgias, gait problem, joint swelling, myalgias, neck pain and neck stiffness.  Skin: Negative.   Neurological: Negative.   Psychiatric/Behavioral: Negative.      Per HPI unless specifically indicated above     Objective:    BP 134/71   Pulse 78   Temp 98.3 F (36.8 C) (Oral)   Ht 6' (1.829 m)   Wt 261 lb 8 oz (118.6 kg)   SpO2 99%   BMI 35.47 kg/m   Wt Readings from Last 3 Encounters:  07/02/22 260 lb (117.9 kg)  06/29/22 261 lb 8 oz (118.6 kg)  06/22/22 259 lb 4.2 oz (117.6 kg)    Physical Exam Vitals and nursing note reviewed.  Constitutional:      General: He is not in acute distress.    Appearance: Normal appearance. He is obese. He is not ill-appearing, toxic-appearing or diaphoretic.  HENT:     Head: Normocephalic and atraumatic.     Right Ear: External ear normal.     Left Ear: External ear normal.     Nose: Nose normal.     Mouth/Throat:     Mouth: Mucous membranes are moist.     Pharynx: Oropharynx is clear.  Eyes:     General: No scleral icterus.       Right eye: No discharge.        Left eye: No discharge.     Extraocular Movements: Extraocular movements intact.     Conjunctiva/sclera: Conjunctivae normal.     Pupils: Pupils are equal, round, and reactive to light.  Cardiovascular:     Rate and Rhythm: Normal rate and regular rhythm.     Pulses: Normal pulses.     Heart sounds: Normal heart sounds. No murmur heard.    No friction rub. No gallop.  Pulmonary:     Effort: Pulmonary effort is normal. No respiratory distress.     Breath sounds: Normal breath sounds. No stridor. No wheezing, rhonchi or rales.  Chest:     Chest wall: No tenderness.  Musculoskeletal:         General: Normal range of motion.     Cervical back: Normal range of motion and neck supple.  Skin:    General: Skin is warm and dry.     Capillary Refill: Capillary refill takes less than 2 seconds.     Coloration: Skin is not jaundiced or pale.     Findings: No bruising, erythema, lesion or rash.  Neurological:     General: No focal deficit present.     Mental Status: He is alert and oriented to person, place, and time. Mental status is at baseline.  Psychiatric:  Mood and Affect: Mood normal.        Behavior: Behavior normal.        Thought Content: Thought content normal.        Judgment: Judgment normal.     Results for orders placed or performed in visit on 06/29/22  Comprehensive metabolic panel  Result Value Ref Range   Glucose 92 70 - 99 mg/dL   BUN 15 8 - 27 mg/dL   Creatinine, Ser 0.86 0.76 - 1.27 mg/dL   eGFR 93 >59 mL/min/1.73   BUN/Creatinine Ratio 17 10 - 24   Sodium 134 134 - 144 mmol/L   Potassium 4.3 3.5 - 5.2 mmol/L   Chloride 97 96 - 106 mmol/L   CO2 23 20 - 29 mmol/L   Calcium 9.0 8.6 - 10.2 mg/dL   Total Protein 7.2 6.0 - 8.5 g/dL   Albumin 4.1 3.8 - 4.8 g/dL   Globulin, Total 3.1 1.5 - 4.5 g/dL   Albumin/Globulin Ratio 1.3 1.2 - 2.2   Bilirubin Total 0.9 0.0 - 1.2 mg/dL   Alkaline Phosphatase 93 44 - 121 IU/L   AST 18 0 - 40 IU/L   ALT 18 0 - 44 IU/L  CBC with Differential/Platelet  Result Value Ref Range   WBC 3.7 3.4 - 10.8 x10E3/uL   RBC 4.86 4.14 - 5.80 x10E6/uL   Hemoglobin 14.1 13.0 - 17.7 g/dL   Hematocrit 41.1 37.5 - 51.0 %   MCV 85 79 - 97 fL   MCH 29.0 26.6 - 33.0 pg   MCHC 34.3 31.5 - 35.7 g/dL   RDW 14.1 11.6 - 15.4 %   Platelets 275 150 - 450 x10E3/uL   Neutrophils 65 Not Estab. %   Lymphs 20 Not Estab. %   Monocytes 12 Not Estab. %   Eos 2 Not Estab. %   Basos 1 Not Estab. %   Neutrophils Absolute 2.4 1.4 - 7.0 x10E3/uL   Lymphocytes Absolute 0.7 0.7 - 3.1 x10E3/uL   Monocytes Absolute 0.5 0.1 - 0.9 x10E3/uL   EOS  (ABSOLUTE) 0.1 0.0 - 0.4 x10E3/uL   Basophils Absolute 0.0 0.0 - 0.2 x10E3/uL   Immature Granulocytes 0 Not Estab. %   Immature Grans (Abs) 0.0 0.0 - 0.1 x10E3/uL  Lipid Panel w/o Chol/HDL Ratio  Result Value Ref Range   Cholesterol, Total 100 100 - 199 mg/dL   Triglycerides 57 0 - 149 mg/dL   HDL 57 >39 mg/dL   VLDL Cholesterol Cal 13 5 - 40 mg/dL   LDL Chol Calc (NIH) 30 0 - 99 mg/dL  PSA  Result Value Ref Range   Prostate Specific Ag, Serum 0.9 0.0 - 4.0 ng/mL  TSH  Result Value Ref Range   TSH 3.390 0.450 - 4.500 uIU/mL  Urinalysis, Routine w reflex microscopic  Result Value Ref Range   Specific Gravity, UA <1.005 (L) 1.005 - 1.030   pH, UA 5.0 5.0 - 7.5   Color, UA Yellow Yellow   Appearance Ur Clear Clear   Leukocytes,UA Negative Negative   Protein,UA Negative Negative/Trace   Glucose, UA 2+ (A) Negative   Ketones, UA Negative Negative   RBC, UA Negative Negative   Bilirubin, UA Negative Negative   Urobilinogen, Ur 0.2 0.2 - 1.0 mg/dL   Nitrite, UA Negative Negative   Microscopic Examination Comment   Microalbumin, Urine Waived  Result Value Ref Range   Microalb, Ur Waived 10 0 - 19 mg/L   Creatinine, Urine Waived 50 10 - 300  mg/dL   Microalb/Creat Ratio <30 <30 mg/g  Bayer DCA Hb A1c Waived  Result Value Ref Range   HB A1C (BAYER DCA - WAIVED) 6.9 (H) 4.8 - 5.6 %      Assessment & Plan:   Problem List Items Addressed This Visit       Cardiovascular and Mediastinum   Persistent atrial fibrillation (HCC)    Stable today. Continue to follow with cardiology. Continue to monitor closely. Call with any concerns.       Relevant Medications   aspirin EC 81 MG tablet   Other Relevant Orders   Ambulatory referral to [place] Referral to Chronic Care Management Services   Essential hypertension    Under good control on current regimen. Continue current regimen. Continue to monitor. Call with any concerns. Refills given. Labs drawn today.         Relevant Medications   aspirin EC 81 MG tablet   AAA (abdominal aortic aneurysm) (HCC)    Known. ?Causing some of his discomfort. s/p pelvic angiogram on 1/11 with vascular surgery, with coil embolization and stent placement with mechanical thrombectomy of the left femoral artery. 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 A1c is 6.9. Kidney function is good- will restart metformin and recheck 3 months. Call with any concerns.       Relevant Medications   aspirin EC 81 MG tablet   metFORMIN (GLUCOPHAGE) 500 MG tablet   Other Relevant Orders   Comprehensive metabolic panel (Completed)   CBC with Differential/Platelet (Completed)   Lipid Panel w/o Chol/HDL Ratio (Completed)   Bayer DCA Hb A1c Waived (Completed)   Ambulatory referral to Home Health   AMB Referral to Chronic Care Management Services   AF (paroxysmal atrial fibrillation) (HCC)    Cardioverted 2x in the hospital. Has not seen cardiology in almost a year. Will get him back into see them.       Relevant Medications   aspirin EC 81 MG tablet   Aortic atherosclerosis (HCC)    Will keep BP and cholesterol and sugars under good control. Continue to monitor. Call with any concerns.       Relevant Medications   aspirin EC 81 MG tablet   Other Relevant Orders   Lipid Panel w/o Chol/HDL Ratio (Completed)   Ambulatory referral to Home Health   AMB Referral to Chronic Care Management Services   Atrial fibrillation with RVR ([place])    Cardioverted 2x in the hospital. Has not seen cardiology in almost a year. Will get him back into see them.       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 ?Causing some of his discomfort. s/p pelvic angiogram on 1/11 with vascular surgery, with coil embolization and stent  placement with mechanical thrombectomy of the left femoral artery. Will get him back into see vascular as scheduled      Relevant Medications   aspirin EC 81 MG tablet   Other Relevant Orders   Comprehensive metabolic panel (Completed)   CBC with Differential/Platelet (Completed)   Ambulatory referral to Home Health   AMB Referral to Chronic Care Management Services   HFrEF (heart failure with reduced ejection fraction) (HCC)    Euvolemic today. Continue to follow with cardiology. Continue to monitor closely. Call with any concerns.       Relevant Medications   aspirin EC 81 MG tablet   NICM (nonischemic cardiomyopathy) (HCC)    Euvolemic today. Continue to follow with cardiology. Continue to monitor closely. Call with any concerns.       Relevant Medications   aspirin EC 81 MG tablet     Respiratory   COPD (chronic obstructive pulmonary disease) (HCC)    Has been SOB. Follow up with cardiology- continue symbicort. Call with any concerns. Recheck 1 month.       Relevant Medications   nicotine (NICODERM CQ) 7 mg/24hr patch   budesonide-formoterol (SYMBICORT) 160-4.5 MCG/ACT inhaler     Digestive   Acute gastritis - Primary    Saw GI while hospitalized. They deferred EGD at that time and would like him to do 8 weeks of PPI then follow up for ?EGD at that time. Continue PPI and will get him back into GI in March.      Relevant Orders   Comprehensive metabolic panel (Completed)   CBC  with Differential/Platelet (Completed)   Ambulatory referral to Home Health   AMB Referral to Chronic Care Management Services     Endocrine   Hyperthyroidism    Rechecking labs today. Await results. Treat as needed.       Relevant Orders   Comprehensive metabolic panel (Completed)   CBC with Differential/Platelet (Completed)   TSH (Completed)   Ambulatory referral to Home Health   AMB Referral to Chronic Care Management Services     Other   Hyperlipidemia    Under good control on current regimen. Continue current regimen. Continue to monitor. Call with any concerns. Refills given. Labs drawn today.        Relevant Medications   aspirin EC 81 MG tablet   Other Relevant Orders   Ambulatory referral to Home Health   AMB Referral to Chronic Care Management Services   Abdominal pain    Likely due to gastritis- pain has resolved. Continue PPI. Continue to monitor.       Relevant Orders   Comprehensive metabolic panel (Completed)   CBC with Differential/Platelet (Completed)   Ambulatory referral to Home Health   AMB Referral to Chronic Care Management Services   Other Visit Diagnoses     Acute respiratory distress       Resolved.   Relevant Orders   Comprehensive metabolic panel (Completed)   CBC with Differential/Platelet (Completed)   Ambulatory referral to Home Health   AMB Referral to Chronic Care Management Services   Hesitancy       Labs drawn today. Await results.   Relevant Orders   PSA (Completed)   Ambulatory referral to Home Health   AMB Referral to Chronic Care Management Services   Flu vaccine need       Flu shot given today.   Relevant Orders   Flu Vaccine QUAD High Dose(Fluad) (Completed)   Ambulatory referral to Home Health   AMB  Referral to Chronic Care Management Services        Follow up plan: Return in about 4 weeks (around 07/27/2022).    >40 minutes spent with patient and daughter today.

## 2022-06-29 NOTE — Assessment & Plan Note (Addendum)
Saw GI while hospitalized. They deferred EGD at that time and would like him to do 8 weeks of PPI then follow up for ?EGD at that time. Continue PPI and will get him back into GI in March.

## 2022-06-29 NOTE — Assessment & Plan Note (Signed)
Cardioverted 2x in the hospital. Has not seen cardiology in almost a year. Will get him back into see them.  

## 2022-06-29 NOTE — Assessment & Plan Note (Addendum)
Known. ?Causing some of his discomfort. s/p pelvic angiogram on 1/11 with vascular surgery, with coil embolization and stent placement with mechanical thrombectomy of the left femoral artery. Will get him back into see vascular as scheduled

## 2022-06-29 NOTE — Assessment & Plan Note (Addendum)
Known. ?Causing some of his discomfort. s/p pelvic angiogram on 1/11 with vascular surgery, with coil embolization and stent placement with mechanical thrombectomy of the left femoral artery. Will get him back into see vascular as scheduled 

## 2022-06-30 LAB — COMPREHENSIVE METABOLIC PANEL
ALT: 18 IU/L (ref 0–44)
AST: 18 IU/L (ref 0–40)
Albumin/Globulin Ratio: 1.3 (ref 1.2–2.2)
Albumin: 4.1 g/dL (ref 3.8–4.8)
Alkaline Phosphatase: 93 IU/L (ref 44–121)
BUN/Creatinine Ratio: 17 (ref 10–24)
BUN: 15 mg/dL (ref 8–27)
Bilirubin Total: 0.9 mg/dL (ref 0.0–1.2)
CO2: 23 mmol/L (ref 20–29)
Calcium: 9 mg/dL (ref 8.6–10.2)
Chloride: 97 mmol/L (ref 96–106)
Creatinine, Ser: 0.86 mg/dL (ref 0.76–1.27)
Globulin, Total: 3.1 g/dL (ref 1.5–4.5)
Glucose: 92 mg/dL (ref 70–99)
Potassium: 4.3 mmol/L (ref 3.5–5.2)
Sodium: 134 mmol/L (ref 134–144)
Total Protein: 7.2 g/dL (ref 6.0–8.5)
eGFR: 93 mL/min/{1.73_m2} (ref >59)

## 2022-06-30 LAB — CBC WITH DIFFERENTIAL/PLATELET
Basophils Absolute: 0 10*3/uL (ref 0.0–0.2)
Basos: 1 %
EOS (ABSOLUTE): 0.1 10*3/uL (ref 0.0–0.4)
Eos: 2 %
Hematocrit: 41.1 % (ref 37.5–51.0)
Hemoglobin: 14.1 g/dL (ref 13.0–17.7)
Immature Grans (Abs): 0 10*3/uL (ref 0.0–0.1)
Immature Granulocytes: 0 %
Lymphocytes Absolute: 0.7 10*3/uL (ref 0.7–3.1)
Lymphs: 20 %
MCH: 29 pg (ref 26.6–33.0)
MCHC: 34.3 g/dL (ref 31.5–35.7)
MCV: 85 fL (ref 79–97)
Monocytes Absolute: 0.5 10*3/uL (ref 0.1–0.9)
Monocytes: 12 %
Neutrophils Absolute: 2.4 10*3/uL (ref 1.4–7.0)
Neutrophils: 65 %
Platelets: 275 10*3/uL (ref 150–450)
RBC: 4.86 x10E6/uL (ref 4.14–5.80)
RDW: 14.1 % (ref 11.6–15.4)
WBC: 3.7 10*3/uL (ref 3.4–10.8)

## 2022-06-30 LAB — PSA: Prostate Specific Ag, Serum: 0.9 ng/mL (ref 0.0–4.0)

## 2022-06-30 LAB — LIPID PANEL W/O CHOL/HDL RATIO
Cholesterol, Total: 100 mg/dL (ref 100–199)
HDL: 57 mg/dL (ref >39)
LDL Chol Calc (NIH): 30 mg/dL (ref 0–99)
Triglycerides: 57 mg/dL (ref 0–149)
VLDL Cholesterol Cal: 13 mg/dL (ref 5–40)

## 2022-06-30 LAB — TSH: TSH: 3.39 u[IU]/mL (ref 0.450–4.500)

## 2022-07-01 ENCOUNTER — Other Ambulatory Visit: Payer: Self-pay | Admitting: Family Medicine

## 2022-07-01 NOTE — Telephone Encounter (Signed)
Requested Prescriptions  Pending Prescriptions Disp Refills   albuterol (VENTOLIN HFA) 108 (90 Base) MCG/ACT inhaler [Pharmacy Med Name: ALBUTEROL HFA (PROVENTIL) INH] 6.7 each 3    Sig: TAKE 2 PUFFS BY MOUTH EVERY 6 HOURS AS NEEDED FOR WHEEZE OR SHORTNESS OF BREATH     Pulmonology:  Beta Agonists 2 Passed - 07/01/2022  1:36 AM      Passed - Last BP in normal range    BP Readings from Last 1 Encounters:  06/29/22 134/71         Passed - Last Heart Rate in normal range    Pulse Readings from Last 1 Encounters:  06/29/22 78         Passed - Valid encounter within last 12 months    Recent Outpatient Visits           2 days ago Acute gastritis, presence of bleeding unspecified, unspecified gastritis type   [place], [place], DO   1 year ago Routine general medical examination at a health care facility   [place], [place], DO   1 year ago [place], [place], DO   1 year ago Diabetes mellitus without complication [facility])   [place] P, DO   2 years ago Diabetes mellitus without complication [facility])   [place], Megan P, DO       Future Appointments             In 4 weeks Wynetta Emery, Barb Merino, DO [place], [facility]

## 2022-07-02 ENCOUNTER — Ambulatory Visit (HOSPITAL_BASED_OUTPATIENT_CLINIC_OR_DEPARTMENT_OTHER): Payer: Medicare HMO | Admitting: Cardiology

## 2022-07-02 ENCOUNTER — Other Ambulatory Visit
Admission: RE | Admit: 2022-07-02 | Discharge: 2022-07-02 | Disposition: A | Discharge status: Home / Self Care | Payer: Medicare HMO | Source: Ambulatory Visit | Attending: Cardiology | Admitting: Cardiology

## 2022-07-02 ENCOUNTER — Telehealth: Payer: Self-pay | Admitting: *Deleted

## 2022-07-02 ENCOUNTER — Encounter: Payer: Self-pay | Admitting: Cardiology

## 2022-07-02 VITALS — BP 120/70 | HR 78 | Wt 260.0 lb

## 2022-07-02 DIAGNOSIS — J449 Chronic obstructive pulmonary disease, unspecified: Secondary | ICD-10-CM | POA: Diagnosis not present

## 2022-07-02 DIAGNOSIS — E0590 Thyrotoxicosis, unspecified without thyrotoxic crisis or storm: Secondary | ICD-10-CM | POA: Insufficient documentation

## 2022-07-02 DIAGNOSIS — I429 Cardiomyopathy, unspecified: Secondary | ICD-10-CM

## 2022-07-02 DIAGNOSIS — I4891 Unspecified atrial fibrillation: Secondary | ICD-10-CM | POA: Diagnosis not present

## 2022-07-02 DIAGNOSIS — I4821 Permanent atrial fibrillation: Secondary | ICD-10-CM | POA: Diagnosis not present

## 2022-07-02 LAB — COMPREHENSIVE METABOLIC PANEL
ALT: 17 U/L (ref 0–44)
AST: 21 U/L (ref 15–41)
Albumin: 3.8 g/dL (ref 3.5–5.0)
Alkaline Phosphatase: 79 U/L (ref 38–126)
Anion gap: 11 (ref 5–15)
BUN: 13 mg/dL (ref 8–23)
CO2: 23 mmol/L (ref 22–32)
Calcium: 9 mg/dL (ref 8.9–10.3)
Chloride: 100 mmol/L (ref 98–111)
Creatinine, Ser: 0.85 mg/dL (ref 0.61–1.24)
GFR, Estimated: >60 mL/min (ref >60)
Glucose, Bld: 124 mg/dL — ABNORMAL HIGH (ref 70–99)
Potassium: 3.5 mmol/L (ref 3.5–5.1)
Sodium: 134 mmol/L — ABNORMAL LOW (ref 135–145)
Total Bilirubin: 1 mg/dL (ref 0.3–1.2)
Total Protein: 7.7 g/dL (ref 6.5–8.1)

## 2022-07-02 LAB — DIGOXIN LEVEL: Digoxin Level: <0.2 ng/mL — ABNORMAL LOW (ref 0.8–2.0)

## 2022-07-02 LAB — TSH: TSH: 4.098 u[IU]/mL (ref 0.350–4.500)

## 2022-07-02 MED ORDER — ENTRESTO 49-51 MG PO TABS: 1.0000 | ORAL_TABLET | Freq: Two times a day (BID) | ORAL | 3 refills | Status: DC

## 2022-07-02 MED ORDER — AMIODARONE HCL 400 MG PO TABS: ORAL_TABLET | ORAL | 1 refills | Status: DC

## 2022-07-02 NOTE — Telephone Encounter (Signed)
Message sent to Reesa Chew to schedule [facility].

## 2022-07-02 NOTE — Patient Instructions (Signed)
  INCREASE entresto to 49/51mg  twice daily.  Take amiodarone 400mg  daily for 1 week then decrease to 200 mg daily.  Your provider requests you have a cardiac MRI.   You have been referred to electrophysiology they will contact you to schedule your appointment.   Your provider requests you have pulmonary function tests.   Routine lab work today. Will notify you of abnormal results  Repeat labs in 10 days.   Follow up in 3 weeks.   Do the following things EVERYDAY: Weigh yourself in the morning before breakfast. Write it down and keep it in a log. Take your medicines as prescribed Eat low salt foods--Limit salt (sodium) to 2000 mg per day.  Stay as active as you can everyday Limit all fluids for the day to less than 2 liters

## 2022-07-04 MED ORDER — ASPIRIN 81 MG PO TBEC: 81.0000 mg | DELAYED_RELEASE_TABLET | Freq: Every day | ORAL | 12 refills | Status: DC

## 2022-07-04 MED ORDER — METFORMIN HCL 500 MG PO TABS: 500.0000 mg | ORAL_TABLET | Freq: Two times a day (BID) | ORAL | 1 refills | Status: DC

## 2022-07-04 MED ORDER — BUDESONIDE-FORMOTEROL FUMARATE 160-4.5 MCG/ACT IN AERO: INHALATION_SPRAY | RESPIRATORY_TRACT | 1 refills | Status: DC

## 2022-07-04 MED ORDER — NICOTINE 7 MG/24HR TD PT24: 7.0000 mg | MEDICATED_PATCH | Freq: Every day | TRANSDERMAL | 3 refills | Status: DC

## 2022-07-04 NOTE — Assessment & Plan Note (Signed)
Euvolemic today. Continue to follow with cardiology. Continue to monitor closely. Call with any concerns.

## 2022-07-04 NOTE — Assessment & Plan Note (Signed)
Taken off his medication for unknown reason. A1c is 6.9. Kidney function is good- will restart metformin and recheck 3 months. Call with any concerns.

## 2022-07-04 NOTE — Assessment & Plan Note (Signed)
Euvolemic today. Continue to follow with cardiology. Continue to monitor closely. Call with any concerns.  

## 2022-07-04 NOTE — Assessment & Plan Note (Signed)
Rechecking labs today. Await results. Treat as needed.  °

## 2022-07-04 NOTE — Assessment & Plan Note (Signed)
Has been SOB. Follow up with cardiology- continue symbicort. Call with any concerns. Recheck 1 month.

## 2022-07-04 NOTE — Progress Notes (Signed)
PCP: Dorcas Carrow, DO HF Cardiology: Dr. Martie Round Hickam is a 72 y.o. male with nonischemic cardiomyopathy based on 2018 cath, HTN, T2DM, HFrEF, long standing persistent atrial fibrillation, hx of tobacco use.  He was admitted on 06/14/22 with complains of chest pain and shortness of breath. On admission, patient noted to be in atrial fibrillation w/ RVR.  He was diuresed and underwent DCCV to NSR.  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  Echo in 1/24 showed EF 20-25% with severe RV dysfunction.   Patient returns for followup of CHF.  He remains short of breath walking around his house from room to room.  He was short of breath walking into the office today.  He reports that his dyspnea has been this severe for at least a year.  He remains in NSR today.  He has orthopnea and sleeps on his side.  Occasional lightheadedness if he stands too fast.  No chest pain.  He continues to smoke 4-5 cigarettes/day.   ECG: NSR, 1st degree AVB, LAFB, LVH  Labs (1/24): K 4.3, creatinine 0.86  PMH: 1. Chronic systolic CHF: Nonischemic cardiomyopathy.   - Echo (2018): EF 25-30% - RHC/LHC (2018): mean RA 12, PA 37/12, mean PCWP 12, CI 1.77; no significant CAD.  - Echo (1/24): EF 20-25%, severely decreased RV systolic function.  2. Ascending aortic aneurysm 3. HTN 4. H/o hyperthyroidism 5. Atrial fibrillation: Persistent.  - 1/24 DCCV to NSR.  6. Type 2 diabetes 7. 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  8. Active smoker  Social History   Socioeconomic History   Marital status: Single    Spouse name: Not on file   Number of children: Not on file   Years of education: Not on file   Highest education level: Not on file  Occupational History    Occupation: retired  Tobacco Use   Smoking status: Some Days    Packs/day: 0.25    Years: 25.00    Total pack years: 6.25    Types: Pipe, Cigarettes   Smokeless tobacco: Never   Tobacco comments:    pipe tobacco   Vaping Use   Vaping Use: Never used  Substance and Sexual Activity   Alcohol use: Yes    Comment: Socially - once a month   Drug use: No   Sexual activity: Not Currently  Other Topics Concern   Not on file  Social History Narrative   Not on file   Social Determinants of Health   Financial Resource Strain: Low Risk  (07/11/2020)   Overall Financial Resource Strain (CARDIA)    Difficulty of Paying Living Expenses: Not hard at all  Food Insecurity: No Food Insecurity (06/28/2022)   Hunger Vital Sign    Worried About Running Out of Food in the Last Year: Never true    Ran Out of Food in the Last Year: Never true  Transportation Needs: No Transportation Needs (06/28/2022)   PRAPARE - Administrator, Civil Service (Medical): No    Lack of Transportation (Non-Medical): No  Physical Activity: Insufficiently Active (07/11/2020)   Exercise Vital Sign    Days of Exercise per Week: 2 days    Minutes of Exercise per Session: 20 min  Stress: No Stress Concern  Present (07/11/2020)   Magazine    Feeling of Stress : Not at all  Social Connections: Not on file  Intimate Partner Violence: Not At Risk (06/15/2022)   Humiliation, Afraid, Rape, and Kick questionnaire    Fear of Current or Ex-Partner: No    Emotionally Abused: No    Physically Abused: No    Sexually Abused: No   Family History  Problem Relation Age of Onset   Diabetes Mother    Diabetes Father    Heart disease Brother    Heart attack Brother    Diabetes Brother    Kidney failure Brother    Thyroid disease Neg Hx    ROS: All systems reviewed and negative except as per HPI.   Current Outpatient Medications  Medication Sig Dispense  Refill   acetaminophen (TYLENOL) 500 MG tablet Take 500-1,000 mg by mouth every 6 (six) hours as needed for mild pain, fever or headache.      albuterol (VENTOLIN HFA) 108 (90 Base) MCG/ACT inhaler TAKE 2 PUFFS BY MOUTH EVERY 6 HOURS AS NEEDED FOR WHEEZE OR SHORTNESS OF BREATH 6.7 each 3   apixaban (ELIQUIS) 5 MG TABS tablet Take 1 tablet (5 mg total) by mouth 2 (two) times daily. 60 tablet 1   aspirin EC 81 MG tablet Take 1 tablet (81 mg total) by mouth daily at 6 (six) AM. Swallow whole. 30 tablet 12   atorvastatin (LIPITOR) 40 MG tablet TAKE 1 TABLET BY MOUTH EVERY DAY 90 tablet 0   budesonide-formoterol (SYMBICORT) 160-4.5 MCG/ACT inhaler TAKE 2 PUFFS BY MOUTH TWICE A DAY 30.6 each 1   digoxin 62.5 MCG TABS Take 0.0625 mg by mouth daily. 30 tablet 1   folic acid (FOLVITE) 1 MG tablet Take 1 mg by mouth daily.     furosemide (LASIX) 40 MG tablet TAKE 1 TABLET BY MOUTH EVERY DAY 90 tablet 1   JARDIANCE 10 MG TABS tablet TAKE 1 TABLET BY MOUTH DAILY BEFORE BREAKFAST. 30 tablet 5   metFORMIN (GLUCOPHAGE) 500 MG tablet Take 500 mg by mouth 2 (two) times daily with a meal.     metoprolol succinate (TOPROL-XL) 50 MG 24 hr tablet Take 1 tablet (50 mg total) by mouth daily. Take with or immediately following a meal. 30 tablet 1   pantoprazole (PROTONIX) 40 MG tablet Take 1 tablet (40 mg total) by mouth 2 (two) times daily. 60 tablet 1   sacubitril-valsartan (ENTRESTO) 49-51 MG Take 1 tablet by mouth 2 (two) times daily. 60 tablet 3   spironolactone (ALDACTONE) 25 MG tablet TAKE 1 TABLET (25 MG TOTAL) BY MOUTH DAILY. 30 tablet 0   thiamine 100 MG tablet Take 1 tablet (100 mg total) by mouth daily. 90 tablet 1   amiodarone (PACERONE) 400 MG tablet Take 400mg  daily for 1 week then decrease to 200mg  daily. 60 tablet 1   hydrocortisone (ANUSOL-HC) 25 MG suppository One suppository twice a day as needed for hemmorhoids, (okay to substitute generic) (Patient not taking: Reported on 07/02/2022) 24 suppository 1    No current facility-administered medications for this visit.   BP 120/70   Pulse 78   Wt 260 lb (117.9 kg)   SpO2 99%   BMI 35.26 kg/m  General: NAD Neck: No JVD, no thyromegaly or thyroid nodule.  Lungs: Distant BS with rhonchi CV: Nondisplaced PMI.  Heart regular S1/S2, no S3/S4, 1/6 HSM LLSB.  No peripheral edema.  No carotid bruit.  Normal  pedal pulses.  Abdomen: Soft, nontender, no hepatosplenomegaly, no distention.  Skin: Intact without lesions or rashes.  Neurologic: Alert and oriented x 3.  Psych: Normal affect. Extremities: No clubbing or cyanosis.  HEENT: Normal.   Assessment/Plan: 1. Chronic systolic CHF: Nonischemic cardiomyopathy, diagnosed 2018.  Cath in 2018 with low CI at 1.77 and no significant CAD.  Most recent echo 1/24 with EF 20-25%, severe RV dysfunction.  Though symptoms are NYHA class III, he is not volume overloaded on exam.  I am concerned that some of his dyspnea is due to COPD.  - Increase Entresto to 49/51 bid, BMET/BNP today and BMET in 10 days.  - Continue spironolactone 25 mg daily.  - Continue digoxin 0.0625 daily, check level today.  - Continue Jardiance 10 mg daily.  - Continue Lasix 40 mg daily.  - I will arrange for cardiac MRI to assess for infiltrative disease.  - With long-standing cardiomyopathy, will refer to EP for ICD.  Narrow QRS so not CRT candidate.  - If exertional symptoms are not explained by COPD (see below), will need RHC/CPX.  2. Atrial fibrillation: Persistent, s/p DCCV to NSR in 1/24. He remains in NSR today.  Suspect AF played a role in his CHF exacerbation but given long-standing cardiomyopathy, suspect his cardiomyopathy is not solely tachycardia-mediated.  - Continue apixaban 5 mg bid.  - Decrease amiodarone today to 200 mg bid x 1 week then 200 mg daily. Check LFTs, TSH today.  He will need regular eye exam.  - I will also ask EP to look at him for atrial fibrillation ablation.  3. Suspect COPD: Patient has been a  long-standing smoker and has dyspnea out of proportion to volume overload on exam.  I suspect significant COPD.  - I will arrange for full PFTs.  - Needs to quit smoking, plans to use nicotine patches.  4. AAA/TAA: Needs vascular followup.   Loralie Champagne 07/04/22

## 2022-07-04 NOTE — Assessment & Plan Note (Signed)
Will keep BP and cholesterol and sugars under good control. Continue to monitor. Call with any concerns.

## 2022-07-04 NOTE — Assessment & Plan Note (Signed)
Likely due to gastritis- pain has resolved. Continue PPI. Continue to monitor.

## 2022-07-04 NOTE — Assessment & Plan Note (Signed)
Stable today. Continue to follow with cardiology. Continue to monitor closely. Call with any concerns.

## 2022-07-04 NOTE — Assessment & Plan Note (Signed)
Under good control on current regimen. Continue current regimen. Continue to monitor. Call with any concerns. Refills given. Labs drawn today.   

## 2022-07-04 NOTE — Assessment & Plan Note (Signed)
Cardioverted 2x in the hospital. Has not seen cardiology in almost a year. Will get him back into see them.

## 2022-07-05 ENCOUNTER — Telehealth: Payer: Self-pay | Admitting: *Deleted

## 2022-07-05 ENCOUNTER — Telehealth: Payer: Self-pay

## 2022-07-05 NOTE — Progress Notes (Signed)
  Chronic Care Management   Note  07/05/2022 Name: Ksean Vale MRN: [id number] DOB: 1951-04-14  Findlay Dagher is a 71 y.o. year old male who is a primary care patient of Tommey, Barret, DO. I reached out to Traci Sermon by phone today in response to a referral sent by Mr. Herbie Baltimore Enzor's PCP.  Mr. Colligan was given information about Chronic Care Management services today including:  CCM service includes personalized support from designated clinical staff supervised by the physician, including individualized plan of care and coordination with other care providers 24/7 contact phone numbers for assistance for urgent and routine care needs. Service will only be billed when office clinical staff spend 20 minutes or more in a month to coordinate care. Only one practitioner may furnish and bill the service in a calendar month. The patient may stop CCM services at amy time (effective at the end of the month) by phone call to the office staff. The patient will be responsible for cost sharing (co-pay) or up to 20% of the service fee (after annual deductible is met)  Mr. Marvon Shillingburg  agreedto scheduling an appointment with the CCM RN Case Manager   Follow up plan: Patient agreed to scheduled appointment with RN Case Manager on 07/08/2022(date/time).   Noreene Larsson, [place], [place] [address] Direct Dial: [phone] [email or web address]

## 2022-07-05 NOTE — Progress Notes (Signed)
  Care Coordination   Note   07/05/2022 Name: Eugene Henderson MRN: [id number] DOB: 10-18-1950  [name] is a 72 y.o. year old male who sees Montrey, [place], DO for primary care. I reached out to Traci Sermon by phone today to offer care coordination services.  [name] was given information about Care Coordination services today including:   The Care Coordination services include support from the care team which includes your Nurse Coordinator, Clinical Social Worker, or Pharmacist.  The Care Coordination team is here to help remove barriers to the health concerns and goals most important to you. Care Coordination services are voluntary, and the patient may decline or stop services at any time by request to their care team member.   Care Coordination Consent Status: Patient agreed to services and verbal consent obtained.   Follow up plan:  Telephone appointment with care coordination team member scheduled for:  07/12/2022  Encounter Outcome:  Pt. Scheduled from referral   Julian Hy, [place] Direct Dial: [phone]

## 2022-07-08 ENCOUNTER — Telehealth: Payer: Self-pay

## 2022-07-08 ENCOUNTER — Encounter: Payer: Self-pay | Admitting: Family Medicine

## 2022-07-08 ENCOUNTER — Telehealth: Payer: Medicare HMO

## 2022-07-08 NOTE — Telephone Encounter (Signed)
   CCM RN Visit Note   Name: [name] MRN: [id number]      DOB: Dec 04, 1950 07-08-2022 Subjective: Eugene Henderson is a 72 y.o. year old male who is a primary care patient of Dr. Wynetta Emery. The patient was referred to the Chronic Care Management team for assistance with care management needs subsequent to provider initiation of CCM services and plan of care.      An unsuccessful telephone outreach was attempted today to contact the patient about Chronic Care Management needs.    Plan:A HIPAA compliant phone message was left for the patient providing contact information and requesting a return call.  Noreene Larsson RN, MSN, CCM RN Care Manager  Chronic Care Management Direct Number: [phone]

## 2022-07-09 ENCOUNTER — Telehealth: Payer: Self-pay

## 2022-07-09 NOTE — Telephone Encounter (Signed)
   CCM RN Visit Note   07-09-2022 Name: Eugene Henderson MRN: [id number]      DOB: July 25, 1950  Subjective: Eugene Henderson is a 72 y.o. year old male who is a primary care patient of Dr. Park Liter The patient was referred to the Chronic Care Management team for assistance with care management needs subsequent to provider initiation of CCM services and plan of care.      Second unsuccessful telephone outreach was attempted today to contact the patient about Chronic Care Management needs.    Plan:A HIPAA compliant phone message was left for the patient providing contact information and requesting a return call.  Noreene Larsson RN, MSN, CCM RN Care Manager  Chronic Care Management Direct Number: [phone]

## 2022-07-12 ENCOUNTER — Ambulatory Visit: Payer: Self-pay | Admitting: *Deleted

## 2022-07-12 DIAGNOSIS — E119 Type 2 diabetes mellitus without complications: Secondary | ICD-10-CM | POA: Diagnosis not present

## 2022-07-12 DIAGNOSIS — I5023 Acute on chronic systolic (congestive) heart failure: Secondary | ICD-10-CM | POA: Diagnosis not present

## 2022-07-12 DIAGNOSIS — I428 Other cardiomyopathies: Secondary | ICD-10-CM | POA: Diagnosis not present

## 2022-07-12 DIAGNOSIS — I110 Hypertensive heart disease with heart failure: Secondary | ICD-10-CM | POA: Diagnosis not present

## 2022-07-12 DIAGNOSIS — I4819 Other persistent atrial fibrillation: Secondary | ICD-10-CM | POA: Diagnosis not present

## 2022-07-12 DIAGNOSIS — F17210 Nicotine dependence, cigarettes, uncomplicated: Secondary | ICD-10-CM | POA: Diagnosis not present

## 2022-07-12 DIAGNOSIS — J449 Chronic obstructive pulmonary disease, unspecified: Secondary | ICD-10-CM | POA: Diagnosis not present

## 2022-07-12 DIAGNOSIS — I716 Thoracoabdominal aortic aneurysm, without rupture, unspecified: Secondary | ICD-10-CM | POA: Diagnosis not present

## 2022-07-12 DIAGNOSIS — K2900 Acute gastritis without bleeding: Secondary | ICD-10-CM | POA: Diagnosis not present

## 2022-07-12 NOTE — Patient Instructions (Signed)
Visit Information  Thank you for taking time to visit with me today. Please don't hesitate to contact me if I can be of assistance to you.   Following are the goals we discussed today:   Goals Addressed             This Visit's Progress    Transportation, Meals on Wheels, and wheelchair repair       Care Coordination Interventions: Patient's daughter contacted to discuss current community resource needs Patient's daughter requesting Meals on Wheels referral, assistance with transportation and resources to repair his electric wheelchair Meals on Wheels-referral to be made-VM left  Medicaid Transportation contacted:referral made, they will contact patient to complete  CSW to follow up with daughter regarding options for electric wheelchair repair          Our next appointment is by telephone on 07/26/22 at 10am  Please call the care guide team at [phone] if you need to cancel or reschedule your appointment.   If you are experiencing a Mental Health or [place] or need someone to talk to, please call 911   Patient verbalizes understanding of instructions and care plan provided today and agrees to view in [place]. Active MyChart status and patient understanding of how to access instructions and care plan via MyChart confirmed with patient.     Telephone follow up appointment with care management team member scheduled for: 07/26/22  Elliot Gurney, [place] Worker  [facility] Care Management [phone]

## 2022-07-12 NOTE — Patient Outreach (Signed)
  Care Coordination   Initial Visit Note   07/12/2022 Name: Eugene Henderson MRN: [id number] DOB: [birth year]  Eugene Henderson is a 72 y.o. year old male who sees Eugene, [place], DO for primary care. I spoke with  Eugene Henderson by phone today.  What matters to the patients health and wellness today?  Meals on Wheels, Transportation, wheelchair repair    Goals Addressed             This Visit's Progress    Transportation, Meals on Wheels, and wheelchair repair       Care Coordination Interventions: Patient's daughter contacted to discuss current community resource needs Patient's daughter requesting Meals on Wheels referral, assistance with transportation and resources to repair his electric wheelchair Meals on Wheels-referral to be made-VM left  Medicaid Transportation contacted:referral made, they will contact patient to complete  CSW to follow up with daughter regarding options for electric wheelchair repair          SDOH assessments and interventions completed:  Yes  SDOH Interventions Today    Flowsheet Row Most Recent Value  SDOH Interventions   Food Insecurity Interventions Intervention Not Indicated  [receives food stamps 192.00-referral to meals on wheels]  Housing Interventions Intervention Not Indicated  Transportation Interventions Other (Comment)  [referral made for medicaid transportation]        Care Coordination Interventions:  Yes, provided   Follow up plan: Follow up call scheduled for 07/26/22    Encounter Outcome:  Pt. Visit Completed

## 2022-07-13 ENCOUNTER — Telehealth: Payer: Self-pay | Admitting: *Deleted

## 2022-07-13 DIAGNOSIS — I716 Thoracoabdominal aortic aneurysm, without rupture, unspecified: Secondary | ICD-10-CM | POA: Diagnosis not present

## 2022-07-13 DIAGNOSIS — J449 Chronic obstructive pulmonary disease, unspecified: Secondary | ICD-10-CM | POA: Diagnosis not present

## 2022-07-13 DIAGNOSIS — I110 Hypertensive heart disease with heart failure: Secondary | ICD-10-CM | POA: Diagnosis not present

## 2022-07-13 DIAGNOSIS — I428 Other cardiomyopathies: Secondary | ICD-10-CM | POA: Diagnosis not present

## 2022-07-13 DIAGNOSIS — F17210 Nicotine dependence, cigarettes, uncomplicated: Secondary | ICD-10-CM | POA: Diagnosis not present

## 2022-07-13 DIAGNOSIS — E119 Type 2 diabetes mellitus without complications: Secondary | ICD-10-CM | POA: Diagnosis not present

## 2022-07-13 DIAGNOSIS — K2900 Acute gastritis without bleeding: Secondary | ICD-10-CM | POA: Diagnosis not present

## 2022-07-13 DIAGNOSIS — I5023 Acute on chronic systolic (congestive) heart failure: Secondary | ICD-10-CM | POA: Diagnosis not present

## 2022-07-13 DIAGNOSIS — I4819 Other persistent atrial fibrillation: Secondary | ICD-10-CM | POA: Diagnosis not present

## 2022-07-13 NOTE — Patient Outreach (Signed)
  Care Coordination   Follow Up Visit Note   07/13/2022 Name: Braydin Aloi MRN: [id number] DOB: 11-Dec-1950  Srihaan Mastrangelo is a 72 y.o. year old male who sees Tc, [place], DO for primary care. I  spoke with [employer] to complete referral for services  What matters to the patients health and wellness today?  Meals on Wheels referral    Goals Addressed             This Visit's Progress    Transportation, Meals on Wheels, and wheelchair repair       Care Coordination Interventions: Meals on Wheels referral completed-patient placed  waiting list Confirmed that referral will be given to the social worker, who will complete home visit to further assess patient's eligibility further once his name comes up on the waiting list Patient's daughter on disability and will be assessed as well CSW to continue to follow up with daughter regarding options for electric wheelchair repair          SDOH assessments and interventions completed:  No     Care Coordination Interventions:  Yes, provided   Interventions Today    Flowsheet Row Most Recent Value  General Interventions   General Interventions Discussed/Reviewed Community Resources        Follow up plan: Follow up call scheduled for 07/26/22    Encounter Outcome:  Pt. Visit Completed

## 2022-07-14 ENCOUNTER — Telehealth: Payer: Self-pay | Admitting: Family Medicine

## 2022-07-14 ENCOUNTER — Other Ambulatory Visit: Payer: Self-pay | Admitting: Family Medicine

## 2022-07-14 NOTE — Telephone Encounter (Signed)
Verbal orders ok

## 2022-07-14 NOTE — Telephone Encounter (Signed)
OK for verbal orders?

## 2022-07-14 NOTE — Telephone Encounter (Signed)
Copied from [place] [id number]. Topic: General - Other >> Jul 14, 2022 12:48 PM Ludger Nutting wrote: Home Health Verbal Orders - Caller/Agency: Jeff/[facility] [place] Number: [phone] Requesting OT/PT/Skilled Nursing/Social Work/Speech Therapy: Nursing Frequency: 1w3 for diseased management and wound care.

## 2022-07-14 NOTE — Telephone Encounter (Signed)
Requested Prescriptions  Pending Prescriptions Disp Refills   spironolactone (ALDACTONE) 25 MG tablet [Pharmacy Med Name: SPIRONOLACTONE 25 MG TABLET] 90 tablet 0    Sig: TAKE 1 TABLET (25 MG TOTAL) BY MOUTH DAILY.     Cardiovascular: Diuretics - Aldosterone Antagonist Failed - 07/14/2022  4:46 AM      Failed - Na in normal range and within 180 days    Sodium  Date Value Ref Range Status  07/02/2022 134 (L) 135 - 145 mmol/L Final  06/29/2022 134 134 - 144 mmol/L Final         Passed - Cr in normal range and within 180 days    Creatinine, Ser  Date Value Ref Range Status  07/02/2022 0.85 0.61 - 1.24 mg/dL Final         Passed - K in normal range and within 180 days    Potassium  Date Value Ref Range Status  07/02/2022 3.5 3.5 - 5.1 mmol/L Final         Passed - eGFR is 30 or above and within 180 days    GFR calc Af Amer  Date Value Ref Range Status  03/28/2020 96 >59 mL/min/1.73 Final    Comment:    **In accordance with recommendations from the NKF-ASN Task force,**   [facility] is in the process of updating its eGFR calculation to the   2021 CKD-EPI creatinine equation that estimates kidney function   without a race variable.    GFR, Estimated  Date Value Ref Range Status  07/02/2022 >60 >60 mL/min Final    Comment:    (NOTE) Calculated using the CKD-EPI Creatinine Equation (2021)    eGFR  Date Value Ref Range Status  06/29/2022 93 >59 mL/min/1.73 Final         Passed - Last BP in normal range    BP Readings from Last 1 Encounters:  07/02/22 120/70         Passed - Valid encounter within last 6 months    Recent Outpatient Visits           2 weeks ago Acute gastritis, presence of bleeding unspecified, unspecified gastritis type   [place], [place], DO   1 year ago Routine general medical examination at a health care facility   [place], [place], DO   1 year ago [place], [place] P, DO   1 year ago Diabetes mellitus without complication [facility])   [place] P, DO   2 years ago Diabetes mellitus without complication [facility])   [place], Megan P, DO       Future Appointments             In 2 weeks Wynetta Emery, Barb Merino, DO [place], [facility]

## 2022-07-14 NOTE — Telephone Encounter (Signed)
Home Health Verbal Orders - Caller/Agency: Angie / [facility]  Callback Number: [phone]/ vm can be left  Requesting PT Frequency: 2x's a week for 4 weeks and  1x a week for 5 weeks  And OT eval order

## 2022-07-14 NOTE — Telephone Encounter (Signed)
Spoke with Angie and provided verbal OK orders for patient. Angie verbalized understanding.

## 2022-07-15 NOTE — Telephone Encounter (Signed)
Left message for Merry Proud in regarding to provide OK per Dr.Behl for verbal orders. Advised to give our office a call back if he has any questions.

## 2022-07-16 DIAGNOSIS — E119 Type 2 diabetes mellitus without complications: Secondary | ICD-10-CM | POA: Diagnosis not present

## 2022-07-16 DIAGNOSIS — J449 Chronic obstructive pulmonary disease, unspecified: Secondary | ICD-10-CM | POA: Diagnosis not present

## 2022-07-16 DIAGNOSIS — I716 Thoracoabdominal aortic aneurysm, without rupture, unspecified: Secondary | ICD-10-CM | POA: Diagnosis not present

## 2022-07-16 DIAGNOSIS — I110 Hypertensive heart disease with heart failure: Secondary | ICD-10-CM | POA: Diagnosis not present

## 2022-07-16 DIAGNOSIS — I4819 Other persistent atrial fibrillation: Secondary | ICD-10-CM | POA: Diagnosis not present

## 2022-07-16 DIAGNOSIS — F17210 Nicotine dependence, cigarettes, uncomplicated: Secondary | ICD-10-CM | POA: Diagnosis not present

## 2022-07-16 DIAGNOSIS — K2900 Acute gastritis without bleeding: Secondary | ICD-10-CM | POA: Diagnosis not present

## 2022-07-16 DIAGNOSIS — I5023 Acute on chronic systolic (congestive) heart failure: Secondary | ICD-10-CM | POA: Diagnosis not present

## 2022-07-16 DIAGNOSIS — I428 Other cardiomyopathies: Secondary | ICD-10-CM | POA: Diagnosis not present

## 2022-07-20 DIAGNOSIS — K2900 Acute gastritis without bleeding: Secondary | ICD-10-CM | POA: Diagnosis not present

## 2022-07-20 DIAGNOSIS — F17210 Nicotine dependence, cigarettes, uncomplicated: Secondary | ICD-10-CM | POA: Diagnosis not present

## 2022-07-20 DIAGNOSIS — I428 Other cardiomyopathies: Secondary | ICD-10-CM | POA: Diagnosis not present

## 2022-07-20 DIAGNOSIS — I5023 Acute on chronic systolic (congestive) heart failure: Secondary | ICD-10-CM | POA: Diagnosis not present

## 2022-07-20 DIAGNOSIS — J449 Chronic obstructive pulmonary disease, unspecified: Secondary | ICD-10-CM | POA: Diagnosis not present

## 2022-07-20 DIAGNOSIS — E119 Type 2 diabetes mellitus without complications: Secondary | ICD-10-CM | POA: Diagnosis not present

## 2022-07-20 DIAGNOSIS — I716 Thoracoabdominal aortic aneurysm, without rupture, unspecified: Secondary | ICD-10-CM | POA: Diagnosis not present

## 2022-07-20 DIAGNOSIS — I110 Hypertensive heart disease with heart failure: Secondary | ICD-10-CM | POA: Diagnosis not present

## 2022-07-20 DIAGNOSIS — I4819 Other persistent atrial fibrillation: Secondary | ICD-10-CM | POA: Diagnosis not present

## 2022-07-22 ENCOUNTER — Ambulatory Visit: Payer: Medicare HMO | Attending: Cardiology

## 2022-07-22 DIAGNOSIS — I110 Hypertensive heart disease with heart failure: Secondary | ICD-10-CM | POA: Diagnosis not present

## 2022-07-22 DIAGNOSIS — F17210 Nicotine dependence, cigarettes, uncomplicated: Secondary | ICD-10-CM | POA: Insufficient documentation

## 2022-07-22 DIAGNOSIS — K2900 Acute gastritis without bleeding: Secondary | ICD-10-CM | POA: Diagnosis not present

## 2022-07-22 DIAGNOSIS — I4819 Other persistent atrial fibrillation: Secondary | ICD-10-CM | POA: Diagnosis not present

## 2022-07-22 DIAGNOSIS — E119 Type 2 diabetes mellitus without complications: Secondary | ICD-10-CM | POA: Diagnosis not present

## 2022-07-22 DIAGNOSIS — J449 Chronic obstructive pulmonary disease, unspecified: Secondary | ICD-10-CM

## 2022-07-22 DIAGNOSIS — I5023 Acute on chronic systolic (congestive) heart failure: Secondary | ICD-10-CM | POA: Diagnosis not present

## 2022-07-22 DIAGNOSIS — I716 Thoracoabdominal aortic aneurysm, without rupture, unspecified: Secondary | ICD-10-CM | POA: Diagnosis not present

## 2022-07-22 DIAGNOSIS — I428 Other cardiomyopathies: Secondary | ICD-10-CM | POA: Diagnosis not present

## 2022-07-22 LAB — PULMONARY FUNCTION TEST ARMC ONLY
DL/VA % pred: 74 %
DL/VA: 2.97 ml/min/mmHg/L
DLCO unc % pred: 56 %
DLCO unc: 15.38 ml/min/mmHg
FEF 25-75 Post: 0.75 L/sec
FEF 25-75 Pre: 0.42 L/sec
FEF2575-%Change-Post: 81 %
FEF2575-%Pred-Post: 28 %
FEF2575-%Pred-Pre: 15 %
FEV1-%Change-Post: 32 %
FEV1-%Pred-Post: 35 %
FEV1-%Pred-Pre: 27 %
FEV1-Post: 1.25 L
FEV1-Pre: 0.94 L
FEV1FVC-%Change-Post: 10 %
FEV1FVC-%Pred-Pre: 62 %
FEV6-%Change-Post: 17 %
FEV6-%Pred-Post: 52 %
FEV6-%Pred-Pre: 44 %
FEV6-Post: 2.34 L
FEV6-Pre: 1.99 L
FEV6FVC-%Change-Post: -1 %
FEV6FVC-%Pred-Post: 100 %
FEV6FVC-%Pred-Pre: 102 %
FVC-%Change-Post: 19 %
FVC-%Pred-Post: 52 %
FVC-%Pred-Pre: 43 %
FVC-Post: 2.47 L
FVC-Pre: 2.06 L
Post FEV1/FVC ratio: 51 %
Post FEV6/FVC ratio: 95 %
Pre FEV1/FVC ratio: 46 %
Pre FEV6/FVC Ratio: 97 %
RV % pred: 154 %
RV: 3.99 L
TLC % pred: 94 %
TLC: 7.05 L

## 2022-07-22 MED ORDER — ALBUTEROL SULFATE (2.5 MG/3ML) 0.083% IN NEBU
2.5000 mg | INHALATION_SOLUTION | Freq: Once | RESPIRATORY_TRACT | Status: AC
  Administered 2022-07-22: 2.5 mg via RESPIRATORY_TRACT

## 2022-07-22 NOTE — Progress Notes (Signed)
Patient ID: Eugene Henderson, male    DOB: Feb 17, 1951, 72 y.o.   MRN: [id number]  HPI  Eugene Henderson is a 72 y.o. male with history of nonischemic cardiomyopathy based on 2018 cath, HTN, T2DM, AAA, HFrEF, long standing persistent atrial fibrillation, hx of tobacco use.    Tee echo 1/24 showed EF 20-25% with severe RV dysfunction and moderate/ severe MR.   RHC/LHC 08/02/16: 1. No significant coronary artery disease. 2. Severely reduced LV systolic function by echo. Left ventricular angiography was not performed.  3. Right heart catheterization showed minimally elevated filling pressures and pulmonary hypertension. Moderately reduced cardiac output. RA pressure: 12 mmHg, RV pressure: 40 over 6 mmHg, PA pressure 37/12 with a mean of 12 mmHg. Pulmonary capillary wedge pressure was 12. Cardiac output was 4.16 with a cardiac index of 1.77.  Admitted 06/14/22 with complains of chest pain and shortness of breath. On admission, patient noted to be in atrial fibrillation w/ RVR.  He was diuresed and underwent DCCV to NSR.  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    Patient returns for HF f/u with a chief complaint of moderate SOB with exertion. Describes this as chronic. Has associated cough, light-headedness and chronic difficulty sleeping along with this. Denies any fatigue, chest tightness, pedal edema, palpitations, abdominal distention or weight gain.   Past Medical History:  Diagnosis Date   Arthritis    Ascending aortic aneurysm ([place])    a. 09/2017 Stable TAA - 5.1cm.   Asthma    Chronic systolic CHF (congestive heart failure) ([place])    a. EF 25-30% by echo in 07/2016 with cath showing no significant CAD b. 01/2017: EF 30-35% with diffuse HK and moderate MR; c. 07/2017 Echo: EF 30-35%, diff hK. Mild MR, mildly dil LA. PASP 37mHg.   Diverticulitis    Diverticulitis  of large intestine with perforation without abscess or bleeding 05/13/2017   Hypertension    Hyperthyroidism    NICM (nonischemic cardiomyopathy) ([place]    Noncompliance    Persistent atrial fibrillation (HCC)    a. CHA2DS2VASc = 3-->Eliquis (? compliance).   Past Surgical History:  Procedure Laterality Date   CARDIOVERSION N/A 06/23/2022   Procedure: CARDIOVERSION;  Surgeon: SHebert Soho DO;  Location: [facility] ORS;  Service: Cardiovascular;  Laterality: N/A;   CARDIOVERSION N/A 06/25/2022   Procedure: CARDIOVERSION;  Surgeon: SHebert Soho DO;  Location: [facility] ORS;  Service: Cardiovascular;  Laterality: N/A;   COLONOSCOPY N/A 08/27/2019   Procedure: COLONOSCOPY;  Surgeon: Toledo, TBenay Pike MD;  Location: [facility] ENDOSCOPY;  Service: Gastroenterology;  Laterality: N/A;   ENDOVASCULAR REPAIR/STENT GRAFT N/A 06/17/2022   Procedure: ENDOVASCULAR REPAIR/STENT GRAFT;  Surgeon: DAlgernon Huxley MD;  [facility];  Service: Cardiovascular;  Laterality: N/A;   JOINT REPLACEMENT Right    RIGHT/LEFT HEART CATH AND CORONARY ANGIOGRAPHY N/A 08/02/2016   Procedure: Right/Left Heart Cath and Coronary Angiography;  Surgeon: MWellington Hampshire MD;  [facility];  Service: Cardiovascular;  Laterality: N/A;   TEE WITHOUT CARDIOVERSION N/A 06/23/2022   Procedure: TRANSESOPHAGEAL ECHOCARDIOGRAM;  Surgeon: SHebert Soho DO;  Location: [facility] ORS;  Service: Cardiovascular;  Laterality: N/A;   TESTICLE SURGERY     Patient states that he had to have the tube fixed.   Family History  Problem Relation Age of Onset   Diabetes Mother    Diabetes Father  Heart disease Brother    Heart attack Brother    Diabetes Brother    Kidney failure Brother    Thyroid disease Neg Hx    Social History   Tobacco Use   Smoking status: Some Days    Packs/day: 0.25    Years: 25.00    Total pack years: 6.25    Types: Pipe, Cigarettes   Smokeless tobacco: Never   Tobacco comments:     pipe tobacco   Substance Use Topics   Alcohol use: Yes    Comment: Socially - once a month   No Known Allergies Prior to Admission medications   Medication Sig Start Date End Date Taking? Authorizing Provider  acetaminophen (TYLENOL) 500 MG tablet Take 500-1,000 mg by mouth every 6 (six) hours as needed for mild pain, fever or headache.    Yes [provider]  albuterol (VENTOLIN HFA) 108 (90 Base) MCG/ACT inhaler TAKE 2 PUFFS BY MOUTH EVERY 6 HOURS AS NEEDED FOR WHEEZE OR SHORTNESS OF BREATH 07/01/22  Yes Costlow, Megan P, DO  amiodarone (PACERONE) 400 MG tablet Take 459m daily for 1 week then decrease to 2053mdaily. 07/02/22  Yes McLarey DresserMD  apixaban (ELIQUIS) 5 MG TABS tablet Take 1 tablet (5 mg total) by mouth 2 (two) times daily. 06/25/22  Yes Wouk, NoAilene RudMD  aspirin EC 81 MG tablet Take 1 tablet (81 mg total) by mouth daily at 6 (six) AM. Swallow whole. 07/04/22  Yes Rainwater, Megan P, DO  atorvastatin (LIPITOR) 40 MG tablet TAKE 1 TABLET BY MOUTH EVERY DAY 05/28/22  Yes Gent, Megan P, DO  budesonide-formoterol (SYMBICORT) 160-4.5 MCG/ACT inhaler TAKE 2 PUFFS BY MOUTH TWICE A DAY 07/04/22  Yes Bartolotta, Megan P, DO  digoxin 62.5 MCG TABS Take 0.0625 mg by mouth daily. 06/26/22  Yes Wouk, NoAilene RudMD  folic acid (FOLVITE) 1 MG tablet Take 1 mg by mouth daily.   Yes [provider]  furosemide (LASIX) 40 MG tablet TAKE 1 TABLET BY MOUTH EVERY DAY 03/01/22  Yes Donati, Megan P, DO  JARDIANCE 10 MG TABS tablet TAKE 1 TABLET BY MOUTH DAILY BEFORE BREAKFAST. 05/04/22  Yes ArWellington HampshireMD  metFORMIN (GLUCOPHAGE) 500 MG tablet Take 1 tablet (500 mg total) by mouth 2 (two) times daily with a meal. 07/04/22  Yes Manny, Megan P, DO  metoprolol succinate (TOPROL-XL) 50 MG 24 hr tablet Take 1 tablet (50 mg total) by mouth daily. Take with or immediately following a meal. 06/26/22  Yes Wouk, NoAilene RudMD  nicotine (NICODERM CQ) 7 mg/24hr patch Place 1  patch (7 mg total) onto the skin daily. 07/04/22  Yes Tooker, Megan P, DO  pantoprazole (PROTONIX) 40 MG tablet Take 1 tablet (40 mg total) by mouth 2 (two) times daily. 06/25/22 06/25/23 Yes Wouk, NoAilene RudMD  potassium chloride SA (KLOR-CON M) 20 MEQ tablet Take 2 tablets (40 mEq total) by mouth daily. 07/23/22   HaAlisa GraffFNP  sacubitril-valsartan (ENTRESTO) 49-51 MG Take 1 tablet by mouth 2 (two) times daily. 07/02/22  Yes McLarey DresserMD  spironolactone (ALDACTONE) 25 MG tablet TAKE 1 TABLET (25 MG TOTAL) BY MOUTH DAILY. 07/14/22  Yes Halm, Megan P, DO  thiamine 100 MG tablet Take 1 tablet (100 mg total) by mouth daily. 10/30/20  Yes Threat, Megan P, DO  hydrocortisone (ANUSOL-HC) 25 MG suppository One suppository twice a day as needed for hemmorhoids, (okay to substitute generic) Patient not taking: Reported  on 07/02/2022 10/30/20   Park Liter P, DO  metFORMIN (GLUCOPHAGE) 500 MG tablet Take 500 mg by mouth 2 (two) times daily with a meal. Patient not taking: Reported on 07/23/2022    [provider]   Review of Systems  Constitutional:  Negative for appetite change and fatigue.  HENT:  Negative for congestion, postnasal drip and sore throat.   Eyes: Negative.   Respiratory:  Positive for cough (productive) and shortness of breath. Negative for chest tightness.   Cardiovascular:  Positive for chest pain (at times). Negative for palpitations and leg swelling.  Gastrointestinal:  Negative for abdominal distention and abdominal pain.  Endocrine: Negative.   Genitourinary: Negative.   Musculoskeletal:  Negative for back pain and neck pain.  Skin: Negative.   Allergic/Immunologic: Negative.   Neurological:  Positive for light-headedness (with sudden position changes). Negative for dizziness.  Hematological:  Negative for adenopathy. Does not bruise/bleed easily.  Psychiatric/Behavioral:  Positive for sleep disturbance (sleeping on 2 pillows). Negative for dysphoric  mood. The patient is not nervous/anxious.    Vitals:   07/23/22 1335  BP: 104/79  Pulse: 65  Resp: 18  SpO2: 99%  Weight: 261 lb 6 oz (118.6 kg)   Wt Readings from Last 3 Encounters:  07/23/22 261 lb 6 oz (118.6 kg)  07/02/22 260 lb (117.9 kg)  06/29/22 261 lb 8 oz (118.6 kg)   Lab Results  Component Value Date   CREATININE 0.89 07/23/2022   CREATININE 0.85 07/02/2022   CREATININE 0.86 06/29/2022   Physical Exam Vitals and nursing note reviewed.  Constitutional:      Appearance: He is well-developed.  HENT:     Head: Normocephalic and atraumatic.  Neck:     Vascular: No JVD.  Cardiovascular:     Rate and Rhythm: Normal rate and regular rhythm.  Pulmonary:     Effort: Pulmonary effort is normal.     Breath sounds: No wheezing, rhonchi or rales.  Abdominal:     Palpations: Abdomen is soft.     Tenderness: There is no abdominal tenderness.  Musculoskeletal:     Cervical back: Normal range of motion and neck supple.     Right lower leg: No tenderness. No edema.     Left lower leg: No tenderness. No edema.  Skin:    General: Skin is warm and dry.  Neurological:     General: No focal deficit present.     Mental Status: He is alert and oriented to person, place, and time.  Psychiatric:        Mood and Affect: Mood normal.        Behavior: Behavior normal.   Assessment/Plan:  1: Chronic systolic CHF- - NYHA class III - euvolemic - weighing daily; reminded to call for overnight weight gain of > 2 pounds or a weekly weight gain of >5 pounds - entresto 49/51 bid - spironolactone 25 mg daily.  - digoxin 0.0625 daily - jardiance 10 mg daily.  - lasix 40 mg daily.  - dig level 07/02/22 was <0.2 - saw ADHF MD Aundra Dubin) 07/02/22 - cardiac MRI scheduled for 08/04/22 - If exertional symptoms are not explained by COPD (see below), will need RHC/CPX.  - BMP today - BNP 06/14/22 was 575.5 - drinks two 25 ounces of beer on the weekends  2: Persistent Atrial fibrillation- -  s/p DCCV to NSR in 1/24. He remains in NSR today.  Suspect AF played a role in his CHF exacerbation but given long-standing cardiomyopathy,  suspect his cardiomyopathy is not solely tachycardia-mediated.  - apixaban 5 mg bid.  - amiodarone 200 mg daily; regular eye exam.  - scheduled EP appt w/Lambert on 08/18/22 to evaluate for atrial fibrillation ablation.  - TSH 07/02/22 was 4.098  3: HTN- - BP 104/79 - saw PCP Wynetta Emery) 06/29/22 - BMP 07/02/22 showed sodium 134, potassium 3.5, creatinine 0.85 & GFR >60  4: Suspect COPD- - had PFT's done 07/22/22 - smoking 1/2 ppd cigarettes; talking about starting nicotine patches  5: AAA/TAA- - s/p coil embolization and stent placement with mechanical thrombectomy 06/2022 - vascular f/u scheduled for 08/02/22   Medication list reviewed.   Return in 1 month, sooner if needed

## 2022-07-23 ENCOUNTER — Encounter: Payer: Self-pay | Admitting: Family

## 2022-07-23 ENCOUNTER — Telehealth: Payer: Self-pay | Admitting: Family

## 2022-07-23 ENCOUNTER — Other Ambulatory Visit
Admission: RE | Admit: 2022-07-23 | Discharge: 2022-07-23 | Disposition: A | Discharge status: Home / Self Care | Payer: Medicare HMO | Source: Ambulatory Visit | Attending: Family | Admitting: Family

## 2022-07-23 ENCOUNTER — Ambulatory Visit (HOSPITAL_BASED_OUTPATIENT_CLINIC_OR_DEPARTMENT_OTHER): Payer: Medicare HMO | Admitting: Family

## 2022-07-23 VITALS — BP 104/79 | HR 65 | Resp 18 | Wt 261.4 lb

## 2022-07-23 DIAGNOSIS — J449 Chronic obstructive pulmonary disease, unspecified: Secondary | ICD-10-CM

## 2022-07-23 DIAGNOSIS — I5023 Acute on chronic systolic (congestive) heart failure: Secondary | ICD-10-CM | POA: Diagnosis not present

## 2022-07-23 DIAGNOSIS — I110 Hypertensive heart disease with heart failure: Secondary | ICD-10-CM | POA: Diagnosis not present

## 2022-07-23 DIAGNOSIS — I5022 Chronic systolic (congestive) heart failure: Secondary | ICD-10-CM

## 2022-07-23 DIAGNOSIS — I77810 Thoracic aortic ectasia: Secondary | ICD-10-CM | POA: Diagnosis not present

## 2022-07-23 DIAGNOSIS — E119 Type 2 diabetes mellitus without complications: Secondary | ICD-10-CM | POA: Diagnosis not present

## 2022-07-23 DIAGNOSIS — F17210 Nicotine dependence, cigarettes, uncomplicated: Secondary | ICD-10-CM | POA: Diagnosis not present

## 2022-07-23 DIAGNOSIS — I4821 Permanent atrial fibrillation: Secondary | ICD-10-CM

## 2022-07-23 DIAGNOSIS — I10 Essential (primary) hypertension: Secondary | ICD-10-CM | POA: Diagnosis not present

## 2022-07-23 DIAGNOSIS — I716 Thoracoabdominal aortic aneurysm, without rupture, unspecified: Secondary | ICD-10-CM | POA: Diagnosis not present

## 2022-07-23 DIAGNOSIS — K2900 Acute gastritis without bleeding: Secondary | ICD-10-CM | POA: Diagnosis not present

## 2022-07-23 DIAGNOSIS — I428 Other cardiomyopathies: Secondary | ICD-10-CM | POA: Diagnosis not present

## 2022-07-23 DIAGNOSIS — I4819 Other persistent atrial fibrillation: Secondary | ICD-10-CM | POA: Diagnosis not present

## 2022-07-23 LAB — BASIC METABOLIC PANEL
Anion gap: 10 (ref 5–15)
BUN: 13 mg/dL (ref 8–23)
CO2: 25 mmol/L (ref 22–32)
Calcium: 8.5 mg/dL — ABNORMAL LOW (ref 8.9–10.3)
Chloride: 102 mmol/L (ref 98–111)
Creatinine, Ser: 0.89 mg/dL (ref 0.61–1.24)
GFR, Estimated: >60 mL/min (ref >60)
Glucose, Bld: 117 mg/dL — ABNORMAL HIGH (ref 70–99)
Potassium: 3 mmol/L — ABNORMAL LOW (ref 3.5–5.1)
Sodium: 137 mmol/L (ref 135–145)

## 2022-07-23 MED ORDER — POTASSIUM CHLORIDE CRYS ER 20 MEQ PO TBCR: 40.0000 meq | EXTENDED_RELEASE_TABLET | Freq: Every day | ORAL | 3 refills | Status: DC

## 2022-07-23 NOTE — Telephone Encounter (Signed)
Spoke with patient's daughter, Eugene Henderson, regarding BMP results obtained earlier today. Kidney function looks great but potassium is low at 3.0. She says that he has plenty of potassium from when he was previously taking it and confirmed that it's the 61mq dose.   Instructed her to give him 2 tablets BID today and tomorrow (Sat). Beginning on Sunday, he will take 2 tablets once daily until he sees his PCP next week. Will message PCP about rechecking his labs at that visit.   Wendi verbalized understanding.

## 2022-07-23 NOTE — Patient Instructions (Signed)
Continue weighing daily and call for an overnight weight gain of 3 pounds or more or a weekly weight gain of more than 5 pounds. ? ? ?If you have voicemail, please make sure your mailbox is cleaned out so that we may leave a message and please make sure to listen to any voicemails.  ? ? ?

## 2022-07-25 ENCOUNTER — Other Ambulatory Visit: Payer: Self-pay | Admitting: Family Medicine

## 2022-07-26 ENCOUNTER — Encounter: Payer: Self-pay | Admitting: *Deleted

## 2022-07-26 ENCOUNTER — Telehealth: Payer: Self-pay | Admitting: *Deleted

## 2022-07-26 DIAGNOSIS — I110 Hypertensive heart disease with heart failure: Secondary | ICD-10-CM | POA: Diagnosis not present

## 2022-07-26 DIAGNOSIS — I5023 Acute on chronic systolic (congestive) heart failure: Secondary | ICD-10-CM | POA: Diagnosis not present

## 2022-07-26 DIAGNOSIS — I4819 Other persistent atrial fibrillation: Secondary | ICD-10-CM | POA: Diagnosis not present

## 2022-07-26 DIAGNOSIS — F17210 Nicotine dependence, cigarettes, uncomplicated: Secondary | ICD-10-CM | POA: Diagnosis not present

## 2022-07-26 DIAGNOSIS — K2900 Acute gastritis without bleeding: Secondary | ICD-10-CM | POA: Diagnosis not present

## 2022-07-26 DIAGNOSIS — I716 Thoracoabdominal aortic aneurysm, without rupture, unspecified: Secondary | ICD-10-CM | POA: Diagnosis not present

## 2022-07-26 DIAGNOSIS — I428 Other cardiomyopathies: Secondary | ICD-10-CM | POA: Diagnosis not present

## 2022-07-26 DIAGNOSIS — E119 Type 2 diabetes mellitus without complications: Secondary | ICD-10-CM | POA: Diagnosis not present

## 2022-07-26 DIAGNOSIS — J449 Chronic obstructive pulmonary disease, unspecified: Secondary | ICD-10-CM | POA: Diagnosis not present

## 2022-07-26 NOTE — Patient Outreach (Signed)
  Care Coordination   07/26/2022 Name: Eugene Henderson MRN: [id number] DOB: 1951/01/25   Care Coordination Outreach Attempts:  An unsuccessful telephone outreach was attempted for a scheduled appointment today.  Follow Up Plan:  Additional outreach attempts will be made to offer the patient care coordination information and services.   Encounter Outcome:  No Answer   Care Coordination Interventions:  No, not indicated    Daquann Merriott, [place] Worker  [facility] Care Management [phone]

## 2022-07-27 ENCOUNTER — Telehealth: Payer: Medicare HMO

## 2022-07-27 ENCOUNTER — Ambulatory Visit (INDEPENDENT_AMBULATORY_CARE_PROVIDER_SITE_OTHER): Payer: Medicare HMO

## 2022-07-27 DIAGNOSIS — I5020 Unspecified systolic (congestive) heart failure: Secondary | ICD-10-CM

## 2022-07-27 DIAGNOSIS — E1159 Type 2 diabetes mellitus with other circulatory complications: Secondary | ICD-10-CM

## 2022-07-27 NOTE — Patient Instructions (Addendum)
Please call the care guide team at [phone] if you need to cancel or reschedule your appointment.   If you are experiencing a Mental Health or [place] or need someone to talk to, please call the Suicide and Crisis Lifeline: 988 call the [place] National Suicide Prevention Lifeline: 804-097-7099 or TTY: 430-441-2075 TTY 609-875-7589) to talk to a trained counselor call 1-800-273-TALK (toll free, 24 hour hotline)   Following is a copy of the CCM Program Consent:  CCM service includes personalized support from designated clinical staff supervised by the physician, including individualized plan of care and coordination with other care providers 24/7 contact phone numbers for assistance for urgent and routine care needs. Service will only be billed when office clinical staff spend 20 minutes or more in a month to coordinate care. Only one practitioner may furnish and bill the service in a calendar month. The patient may stop CCM services at amy time (effective at the end of the month) by phone call to the office staff. The patient will be responsible for cost sharing (co-pay) or up to 20% of the service fee (after annual deductible is met)  Following is a copy of your full provider care plan:   Goals Addressed             This Visit's Progress    CCM Expected Outcome:  Monitor, Self-Manage and Reduce Symptoms of Diabetes       Current Barriers:  Knowledge Deficits related to how often to check blood sugars and the goal of A1C levels Care Coordination needs related to the need for a new motorized chair and asking for recommendations for exercise like water aerobics in a patient with DM Chronic Disease Management support and education needs related to effective management of DM Lab Results  Component Value Date   HGBA1C 6.9 (H) 06/29/2022     Planned Interventions: Provided education to patient about basic DM disease process. Education on the goal of A1C. The patient is  right at goal. Education provided on monitoring for blood sugar changes and what the A1C test was and the goal of keeping A1C <7.0 ; Reviewed medications with patient and discussed importance of medication adherence. The patient is compliant with medications. His daughter assist him with managing his medications. ;        Reviewed prescribed diet with patient heart healthy/ADA diet. Will send planning healthy meals to the patient by mail. Address confirmed; Counseled on importance of regular laboratory monitoring as prescribed;        Discussed plans with patient for ongoing care management follow up and provided patient with direct contact information for care management team;      Provided patient with written educational materials related to hypo and hyperglycemia and importance of correct treatment;       Reviewed scheduled/upcoming provider appointments including: 07-29-2022 at 420 pm- reminder provided today;         Advised patient, providing education and rationale, to check cbg when you have symptoms of low or high blood sugar and as directed by the pcp  and record. Per the daughter the patient has been getting blood sugar checks from the nurse when they come to the home. The patients daughter states that she does not know what they are but they are good. They have equipment to check blood sugars in the home setting. Education on discussing how often the pcp would like for the patient to check his blood sugars. Next appointment with the pcp on  07-29-2022       call provider for findings outside established parameters;       Referral made to social work team for assistance with support and education for expressed needs. Has talked with the LCSW and has upcoming appointments with the LCSW;      Referral made to community resources care guide team for assistance with has talked with the care guides for assistance with food resources and other expressed needs. 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 Wears glasses but cannot see out of them well. States he needs a new eye exam and she will look into this. DME needs: The patients daughter states that his mobile chair is >58 years old and needs repair. She had PT look at it and they do not believe it can be repaired. The daughter was inquiring about an order for a new chair. Education provided on writing down questions to ask the pcp at upcoming appointment for 07-29-2022 about DME. Also provided the patients daughter with the number to: [employer]: [phone] for additional help with equipment needs as they help patients get needed medical equipment.  She states she needs a wider shower chair for the patient.   Symptom Management: Take medications as prescribed   Attend all scheduled provider appointments Call provider office for new concerns or questions  call the Suicide and Crisis Lifeline: 988 call the [place] National Suicide Prevention Lifeline: (724) 527-7397 or TTY: 602-297-6943 TTY (564)175-6855) to talk to a trained counselor call 1-800-273-TALK (toll free, 24 hour hotline) if experiencing a Mental Health or [place]  schedule appointment with eye doctor check blood sugar at prescribed times: when you have symptoms of low or high blood sugar and as ordered by the pcp  check feet daily for cuts, sores or redness trim toenails straight across wash and dry feet carefully every day wear comfortable, cotton socks wear comfortable, well-fitting shoes  Follow Up Plan: Telephone follow up appointment with care management team member  scheduled for: 09-14-2022 at 145 pm       CCM Expected Outcome:  Monitor, Self-Manage and Reduce Symptoms of Heart Failure       Current Barriers:  Knowledge Deficits related to seeing cardiologist and other specialist on a regular basis to effectively manage HF and other chronic heart conditions.  Care Coordination needs related to medication needs and other expressed needs involving interdisciplinary team involvement in a patient with HF and other chronic conditions Chronic Disease Management support and education needs related to effective management of HF and other chronic heart conditions Wt Readings from Last 3 Encounters:  07/23/22 261 lb 6 oz (118.6 kg)  07/02/22 260 lb (117.9 kg)  06/29/22 261 lb 8 oz (118.6 kg)     Planned Interventions: Basic overview and discussion of pathophysiology of Heart Failure reviewed Provided education on low sodium diet. Education provided on heart healthy/ADA diet and will send information in the mail for planning healthy meals. Reviewed Heart Failure Action Plan in depth and provided  written copy Assessed need for readable accurate scales in home Provided education about placing scale on hard, flat surface Advised patient to weigh each morning after emptying bladder Discussed importance of daily weight and advised patient to weigh and record daily Reviewed role of diuretics in prevention of fluid overload and management of heart failure. Review of medications and the patients daughter states compliance with medications. She helps the patient manage his medications Discussed the importance of keeping all appointments with provider. 07-29-2022 at 420 pm with the pcp, reminder given today. Provided patient with education about the role of exercise in the management of heart failure. The patients daughter is interested in the patient doing water aerobics as this has helped her with walking and balance. Education provided that this is something to discuss with  the pcp at upcoming visit on 07-29-2022,  Referral made to community resources care guide team for assistance with the patients daughter has spoke to the LCSW and the care guide team about resources to help with food and other expressed needs Advised patient to discuss changes in heart failure, questions, and concerns with provider Screening for signs and symptoms of depression related to chronic disease state  Assessed social determinant of health barriers  Symptom Management: Take medications as prescribed   Attend all scheduled provider appointments Call provider office for new concerns or questions  call the Suicide and Crisis Lifeline: 988 call the [place] National Suicide Prevention Lifeline: (864) 094-7419 or TTY: (951)035-9706 TTY (949)539-8727) to talk to a trained counselor call 1-800-273-TALK (toll free, 24 hour hotline) if experiencing a Mental Health or [place]  call office if I gain more than 2 pounds in one day or 5 pounds in one week track weight in diary use salt in moderation watch for swelling in feet, ankles and legs every day weigh myself daily develop a rescue plan follow rescue plan if symptoms flare-up track symptoms and what helps feel better or worse dress right for the weather, hot or cold  Follow Up Plan: Telephone follow up appointment with care management team member scheduled for: 09-14-2022 at 145 pm          The patient verbalized understanding of instructions, educational materials, and care plan provided today and agreed to receive a mailed copy of patient instructions, educational materials, and care plan.   Telephone follow up appointment with care management team member scheduled for: 09-14-2022 at 145 pm  Exercise Information for Aging Adults Staying physically active is important as you age. Physical activity and exercise can help in maintaining quality of life, health, physical function, and reducing falls. The four types of  exercises that are best for older adults are endurance, strength, balance, and flexibility. Contact your health care provider before you start any exercise routine. Ask your health care provider what activities are safe for you. What are the risks? Risks associated with exercising include: Overdoing it. This may lead to sore muscles or fatigue. Falls. Injuries. Dehydration. How to do these exercises Endurance exercises Endurance (aerobic) exercises raise your breathing rate and heart rate. Increasing your endurance helps you do everyday tasks and stay healthy. By improving the health of your body system that includes your heart, lungs, and blood vessels (circulatory system), you may also delay or prevent diseases such as heart disease, diabetes, and weak bones (osteoporosis). Types of endurance exercises include: Sports. Indoor activities, such as using gym equipment, doing water aerobics, or dancing. Outdoor activities, such as biking or jogging. Tasks around the house,  such as gardening, yard work, and heavy household chores like cleaning. Walking, such as hiking or walking around your neighborhood. When doing endurance exercises, make sure you: Are aware of your surroundings. Use safety equipment as directed. Dress in layers when exercising outdoors. Drink plenty of water to stay well hydrated. Build up endurance slowly. Start with 10 minutes at a time, and gradually build up to doing 30 minutes at a time. Unless your health care provider gave you different instructions, aim to exercise for a total of 150 minutes a week. Spread out that time so you are working on endurance 3 or more days a week. Strength exercises Lifting, pulling, or pushing weights helps to strengthen muscles. Having stronger muscles makes it easier to do everyday activities, such as getting up from a chair, climbing stairs, carrying groceries, and playing with grandchildren. Strength exercises include arm and leg  exercises that may be done: With weights. Without weights (using your own body weight). With a resistance band. When doing strength exercises: Move smoothly and steadily. Do not suddenly thrust or jerk the weights, the resistance band, or your body. Start with no weights or with light weights, and gradually add more weight over time. Eventually, aim to use weights that are hard or very hard for you to lift. This means that you are able to do 8 repetitions with the weight, and the last few repetitions are very challenging. Lift or push weights into position for 3 seconds, hold the position for 1 second, and then take 3 seconds to return to your starting position. Breathe out (exhale) during difficult movements, like lifting or pushing weights. Breathe in (inhale) to relax your muscles before the next repetition. Consider alternating arms or legs, especially when you first start strength exercises. Expect some slight muscle soreness after each session. Do strength exercises on 2 or more days a week, for 30 minutes at a time. Avoid exercising the same muscle groups two days in a row. For example, if you work on your leg muscles one day, work on your arm muscles the next day. When you can do two sets of 10-15 repetitions with a certain weight, increase the amount of weight. Balance exercises Balance exercises can help to prevent falls. Balance exercises include: Standing on one foot. Heel-to-toe walk. Balance walk. Tai chi. Make sure you have something sturdy to hold onto while doing balance exercises, such as a sturdy chair. As your balance improves, challenge yourself by holding on to the chair with one hand instead of two, and then with no hands. Trying exercises with your eyes closed also challenges your balance, but be sure to have a sturdy surface (like a countertop) close by in case you need it. Do balance exercises as often as you want, or as often as directed by your health care  provider. Flexibility exercises  Flexibility exercises improve how far you can bend, straighten, move, or rotate parts of your body (range of motion). These exercises also help you do everyday activities such as getting dressed or reaching for objects. Flexibility exercises include stretching different parts of the body, and they may be done in a standing or seated position or on the floor. When stretching, make sure you: Keep a slight bend in your arms and legs. Avoid completely straightening ("locking") your joints. Do not stretch so far that you feel pain. You should feel a mild stretching feeling. You may try stretching farther as you become more flexible over time. Relax and breathe between stretches.  Hold on to something sturdy for balance as needed. Hold each stretch for 10-30 seconds. Repeat each stretch 3-5 times. General safety tips Exercise in well-lit areas. Do not hold your breath during exercises or stretches. Warm up before exercising, and cool down after exercising. This can help prevent injury. Drink plenty of water during exercise or any activity that makes you sweat. If you are not sure if an exercise is safe for you, or you are not sure how to do an exercise, talk with your health care provider. This is especially important if you have had surgery on muscles, bones, or joints (orthopedic surgery). Where to find more information You can find more information about exercise for older adults from: Your local health department, fitness center, or community center. These facilities may have programs for aging adults. [employer] on Aging: http://kim-miller.com/ National Council on Aging: www.ncoa.org Summary Staying physically active is important as you age. Doing endurance, strength, balance, and flexibility exercises can help in maintaining quality of life, health, physical function, and reducing falls. Make sure to contact your health care provider before you start any  exercise routine. Ask your health care provider what activities are safe for you. This information is not intended to replace advice given to you by your health care provider. Make sure you discuss any questions you have with your health care provider. Document Revised: 10/06/2020 Document Reviewed: 10/06/2020 Elsevier Patient Education  [place]. Hypoglycemia Hypoglycemia is when the sugar (glucose) level in your blood is too low. Low blood sugar can happen to people who have diabetes and people who do not have diabetes. Low blood sugar can happen quickly, and it can be an emergency. What are the causes? This condition happens most often in people who have diabetes. It may be caused by: Diabetes medicine. Not eating enough, or not eating often enough. Doing more physical activity. Drinking alcohol on an empty stomach. If you do not have diabetes, this condition may be caused by: A tumor in the pancreas. Not eating enough, or not eating for long periods at a time (fasting). A very bad infection or illness. Problems after having weight loss (bariatric) surgery. Kidney failure or liver failure. Certain medicines. What increases the risk? This condition is more likely to develop in people who: Have diabetes and take medicines to lower their blood sugar. Abuse alcohol. Have a very bad illness. What are the signs or symptoms? Mild Hunger. Sweating and feeling clammy. Feeling dizzy or light-headed. Being sleepy or having trouble sleeping. Feeling like you may vomit (nauseous). A fast heartbeat. A headache. Blurry vision. Mood changes, such as: Being grouchy. Feeling worried or nervous (anxious). Tingling or loss of feeling (numbness) around your mouth, lips, or tongue. Moderate Confusion and poor judgment. Behavior changes. Weakness. Uneven heartbeat. Trouble with moving (coordination). Very low Very low blood sugar (severe hypoglycemia) is a medical emergency. It can  cause: Fainting. Seizures. Loss of consciousness (coma). Death. How is this treated? Treating low blood sugar Low blood sugar is often treated by eating or drinking something that has sugar in it right away. The food or drink should contain 15 grams of a fast-acting carb (carbohydrate). Options include: 4 oz (120 mL) of fruit juice. 4 oz (120 mL) of regular soda (not diet soda). A few pieces of hard candy. Check food labels to see how many pieces to eat for 15 grams. 1 Tbsp (15 mL) of sugar or honey. 4 glucose tablets. 1 tube of glucose gel. Treating low  blood sugar if you have diabetes If you can think clearly and swallow safely, follow the 15:15 rule: Take 15 grams of a fast-acting carb. Talk with your doctor about how much you should take. Always keep a source of fast-acting carb with you, such as: Glucose tablets (take 4 tablets). A few pieces of hard candy. Check food labels to see how many pieces to eat for 15 grams. 4 oz (120 mL) of fruit juice. 4 oz (120 mL) of regular soda (not diet soda). 1 Tbsp (15 mL) of honey or sugar. 1 tube of glucose gel. Check your blood sugar 15 minutes after you take the carb. If your blood sugar is still at or below 70 mg/dL (3.9 mmol/L), take 15 grams of a carb again. If your blood sugar does not go above 70 mg/dL (3.9 mmol/L) after 3 tries, get help right away. After your blood sugar goes back to normal, eat a meal or a snack within 1 hour.  Treating very low blood sugar If your blood sugar is below 54 mg/dL (3 mmol/L), you have very low blood sugar, or severe hypoglycemia. This is an emergency. Get medical help right away. If you have very low blood sugar and you cannot eat or drink, you will need to be given a hormone called glucagon. A family member or friend should learn how to check your blood sugar and how to give you glucagon. Ask your doctor if you need to have an emergency glucagon kit at home. Very low blood sugar may also need to be  treated in a hospital. Follow these instructions at home: General instructions Take over-the-counter and prescription medicines only as told by your doctor. Stay aware of your blood sugar as told by your doctor. If you drink alcohol: Limit how much you have to: 0-1 drink a day for women who are not pregnant. 0-2 drinks a day for men. Know how much alcohol is in your drink. In the [place] one drink equals one 12 oz bottle of beer (355 mL), one 5 oz glass of wine (148 mL), or one 1 oz glass of hard liquor (44 mL). Be sure to eat food when you drink alcohol. Know that your body absorbs alcohol quickly. This may lead to low blood sugar later. Be sure to keep checking your blood sugar. Keep all follow-up visits. If you have diabetes:  Always have a fast-acting carb (15 grams) with you to treat low blood sugar. Follow your diabetes care plan as told by your doctor. Make sure you: Know the symptoms of low blood sugar. Check your blood sugar as often as told. Always check it before and after exercise. Always check your blood sugar before you drive. Take your medicines as told. Follow your meal plan. Eat on time. Do not skip meals. Share your diabetes care plan with: Your work or school. People you live with. Carry a card or wear jewelry that says you have diabetes. Where to find more information American Diabetes Association: www.diabetes.org Contact a doctor if: You have trouble keeping your blood sugar in your target range. You have low blood sugar often. Get help right away if: You still have symptoms after you eat or drink something that contains 15 grams of fast-acting carb, and you cannot get your blood sugar above 70 mg/dL by following the 15:15 rule. Your blood sugar is below 54 mg/dL (3 mmol/L). You have a seizure. You faint. These symptoms may be an emergency. Get help right away. Call your local  emergency services (911 in the [place] Do not wait to see if the symptoms will go  away. Do not drive yourself to the hospital. Summary Hypoglycemia happens when the level of sugar (glucose) in your blood is too low. Low blood sugar can happen to people who have diabetes and people who do not have diabetes. Low blood sugar can happen quickly, and it can be an emergency. Make sure you know the symptoms of low blood sugar and know how to treat it. Always keep a source of sugar (fast-acting carb) with you to treat low blood sugar. This information is not intended to replace advice given to you by your health care provider. Make sure you discuss any questions you have with your health care provider. Document Revised: 04/24/2020 Document Reviewed: 04/24/2020 Elsevier Patient Education  [place]. Hyperglycemia Hyperglycemia is when the sugar (glucose) level in your blood is too high. High blood sugar can happen to people who have or do not have diabetes. High blood sugar can happen quickly. It can be an emergency. What are the causes? If you have diabetes, high blood sugar may be caused by: Medicines that increase blood sugar or affect your control of diabetes. Getting less physical activity. Overeating. Being sick or injured or having an infection. Having surgery. Stress. Not giving yourself enough insulin (if you are taking it). You may have high blood sugar because you have diabetes that has not been diagnosed yet. If you do not have diabetes, high blood sugar may be caused by: Certain medicines. Stress. A bad illness. An infection. Having surgery. Diseases of the pancreas. What increases the risk? This condition is more likely to develop in people who have risk factors for diabetes, such as: Having a family member with diabetes. Certain conditions in which the body's defense system (immune system) attacks itself. These are called autoimmune disorders. Being overweight. Not being active. Having a condition called insulin resistance. Having a history  of: Prediabetes. Diabetes when pregnant. Polycystic ovarian syndrome (PCOS). What are the signs or symptoms? This condition may not cause symptoms. If you do have symptoms, they may include: Feeling more thirsty than normal. Needing to pee (urinate) more often than normal. Hunger. Feeling very tired. Blurry eyesight (vision). You may get other symptoms as the condition gets worse, such as: Dry mouth. Pain in your belly (abdomen). Not being hungry (loss of appetite). Breath that smells fruity. Weakness. Weight loss that is not planned. A tingling or numb feeling in your hands or feet. A headache. Cuts or bruises that heal slowly. How is this treated? Treatment depends on the cause of your condition. Treatment may include: Taking medicine to control your blood sugar levels. Changing your medicine or dosage if you take insulin or other diabetes medicines. Lifestyle changes. These may include: Exercising more. Eating healthier foods. Losing weight. Treating an illness or infection. Checking your blood sugar more often. Stopping or reducing steroid medicines. If your condition gets very bad, you will need to be treated in the hospital. Follow these instructions at home: General instructions Take over-the-counter and prescription medicines only as told by your doctor. Do not smoke or use any products that contain nicotine or tobacco. If you need help quitting, ask your doctor. If you drink alcohol: Limit how much you have to: 0-1 drink a day for women who are not pregnant. 0-2 drinks a day for men. Know how much alcohol is in a drink. In the [place] one drink equals one 12 oz bottle  of beer (355 mL), one 5 oz glass of wine (148 mL), or one 1 oz glass of hard liquor (44 mL). Manage stress. If you need help with this, ask your doctor. Do exercises as told by your doctor. Keep all follow-up visits. Eating and drinking  Stay at a healthy weight. Make sure you drink enough fluid  when you: Exercise. Get sick. Are in hot temperatures. Drink enough fluid to keep your pee (urine) pale yellow. If you have diabetes:  Know the symptoms of high blood sugar. Follow your diabetes management plan as told by your doctor. Make sure you: Take insulin and medicines as told. Follow your exercise plan. Follow your meal plan. Eat on time. Do not skip meals. Check your blood sugar as often as told. Make sure you check before and after exercise. If you exercise longer or in a different way, check your blood sugar more often. Follow your sick day plan whenever you cannot eat or drink normally. Make this plan ahead of time with your doctor. Share your diabetes management plan with people in your workplace, school, and household. Check your pee for ketones when you are ill and as told by your doctor. Carry a card or wear jewelry that says that you have diabetes. Where to find more information American Diabetes Association: www.diabetes.org Contact a doctor if: Your blood sugar level is at or above 240 mg/dL (13.3 mmol/L) for 2 days in a row. You have problems keeping your blood sugar in your target range. You have high blood pressure often. You have signs of illness, such as: Feeling like you may vomit (feeling nauseous). Vomiting. A fever. Get help right away if: Your blood sugar monitor reads "high" even when you are taking insulin. You have trouble breathing. You have a change in how you think, feel, or act (mental status). You feel like you may vomit, and the feeling does not go away. You cannot stop vomiting. These symptoms may be an emergency. Get medical help right away. Call your local emergency services (911 in the [place] Do not wait to see if the symptoms will go away. Do not drive yourself to the hospital. Summary Hyperglycemia is when the sugar (glucose) level in your blood is too high. High blood sugar can happen to people who have or do not have diabetes. Make  sure you drink enough fluids and follow your meal plan. Exercise as often as told by your doctor. Contact your doctor if you have problems keeping your blood sugar in your target range. This information is not intended to replace advice given to you by your health care provider. Make sure you discuss any questions you have with your health care provider. Document Revised: 03/07/2020 Document Reviewed: 03/07/2020 Elsevier Patient Education  [place]. Hemoglobin A1C Test Why am I having this test? You may have the hemoglobin A1C test (A1C test) done to: Check your risk for developing diabetes. Diagnose diabetes. Check the long-term control of blood sugar (glucose) in people who have diabetes and help make treatment decisions. This test may be done with other blood glucose tests, such as fasting blood glucose and oral glucose tolerance tests. What is being tested? Hemoglobin is a type of protein in the blood that carries oxygen. Glucose attaches to hemoglobin to form glycated hemoglobin. This test checks the amount of glycated hemoglobin in your blood. This is a good indicator of the average amount of glucose in your blood during the past 2-3 months. What kind of sample  is taken?  A blood sample is required for this test. It is usually collected by inserting a needle into a blood vessel. Tell a health care provider about: All medicines you are taking, including vitamins, herbs, eye drops, creams, and over-the-counter medicines. Any blood disorders you have. Any surgeries you have had. Any medical conditions you have. Whether you are pregnant or may be pregnant. How are the results reported? Your results will be reported as a percentage indicating how much of your hemoglobin has glucose attached to it. Your health care provider will compare your results to normal ranges established after testing a large group of people (reference ranges). Reference ranges may vary among labs and  hospitals. For this test, common reference ranges are: Adult or child without diabetes: 4-5.6%. Adult or child with prediabetes: 5.7%-6.4%. Adult or child with diabetes: 6.5% or higher. What do the results mean? If you do not have diabetes: A result within the reference range means that you are not at high risk for diabetes. A result of 5.7-6.4% means that you have a high risk of developing diabetes, and you have prediabetes. Having prediabetes puts you at risk for developing type 2 diabetes. You may have more tests, including a repeat A1C test. Results of 6.5% or higher on two separate A1C tests mean that you have diabetes. You may have more tests to confirm the diagnosis. If you have diabetes: A result of 7% usually means that your diabetes is under control. A result of less than 7% also means that diabetes is under control. This result may be an appropriate goal for those for whom there is a low risk of low blood sugar (hypoglycemia). A result of less than 8% may also mean that diabetes is under control. This result may be an appropriate goal for those who do not benefit from more blood glucose control or who are at high risk for hypoglycemia. Abnormally low A1C values may be caused by: Pregnancy. Severe blood loss. Receiving donated blood (transfusions). Low red blood cell count (anemia). Long-term kidney failure. Some unusual forms (variants) of hemoglobin. Talk with your health care provider about what your results mean. Questions to ask your health care provider Ask your health care provider, or the department that is doing the test: When will my results be ready? How will I get my results? What are my treatment options? What other tests do I need? What are my next steps? Summary The A1C test is done to check your risk for developing diabetes, diagnose diabetes, and check the long-term control of blood sugar (glucose) in people who have diabetes and help make treatment  decisions. Hemoglobin is a type of protein in the blood that carries oxygen. Glucose attaches to hemoglobin to form glycated hemoglobin. This test checks the amount of glycated hemoglobin in your blood. Talk with your health care provider about what your results mean. This information is not intended to replace advice given to you by your health care provider. Make sure you discuss any questions you have with your health care provider. Document Revised: 12/29/2021 Document Reviewed: 07/08/2021 Elsevier Patient Education  [place]. Diabetes Mellitus and Nutrition, Adult When you have diabetes, or diabetes mellitus, it is very important to have healthy eating habits because your blood sugar (glucose) levels are greatly affected by what you eat and drink. Eating healthy foods in the right amounts, at about the same times every day, can help you: Manage your blood glucose. Lower your risk of heart disease. Improve  your blood pressure. Reach or maintain a healthy weight. What can affect my meal plan? Every person with diabetes is different, and each person has different needs for a meal plan. Your health care provider may recommend that you work with a dietitian to make a meal plan that is best for you. Your meal plan may vary depending on factors such as: The calories you need. The medicines you take. Your weight. Your blood glucose, blood pressure, and cholesterol levels. Your activity level. Other health conditions you have, such as heart or kidney disease. How do carbohydrates affect me? Carbohydrates, also called carbs, affect your blood glucose level more than any other type of food. Eating carbs raises the amount of glucose in your blood. It is important to know how many carbs you can safely have in each meal. This is different for every person. Your dietitian can help you calculate how many carbs you should have at each meal and for each snack. How does alcohol affect me? Alcohol  can cause a decrease in blood glucose (hypoglycemia), especially if you use insulin or take certain diabetes medicines by mouth. Hypoglycemia can be a life-threatening condition. Symptoms of hypoglycemia, such as sleepiness, dizziness, and confusion, are similar to symptoms of having too much alcohol. Do not drink alcohol if: Your health care provider tells you not to drink. You are pregnant, may be pregnant, or are planning to become pregnant. If you drink alcohol: Limit how much you have to: 0-1 drink a day for women. 0-2 drinks a day for men. Know how much alcohol is in your drink. In the [place] one drink equals one 12 oz bottle of beer (355 mL), one 5 oz glass of wine (148 mL), or one 1 oz glass of hard liquor (44 mL). Keep yourself hydrated with water, diet soda, or unsweetened iced tea. Keep in mind that regular soda, juice, and other mixers may contain a lot of sugar and must be counted as carbs. What are tips for following this plan?  Reading food labels Start by checking the serving size on the Nutrition Facts label of packaged foods and drinks. The number of calories and the amount of carbs, fats, and other nutrients listed on the label are based on one serving of the item. Many items contain more than one serving per package. Check the total grams (g) of carbs in one serving. Check the number of grams of saturated fats and trans fats in one serving. Choose foods that have a low amount or none of these fats. Check the number of milligrams (mg) of salt (sodium) in one serving. Most people should limit total sodium intake to less than 2,300 mg per day. Always check the nutrition information of foods labeled as "low-fat" or "nonfat." These foods may be higher in added sugar or refined carbs and should be avoided. Talk to your dietitian to identify your daily goals for nutrients listed on the label. Shopping Avoid buying canned, pre-made, or processed foods. These foods tend to be high in  fat, sodium, and added sugar. Shop around the outside edge of the grocery store. This is where you will most often find fresh fruits and vegetables, bulk grains, fresh meats, and fresh dairy products. Cooking Use low-heat cooking methods, such as baking, instead of high-heat cooking methods, such as deep frying. Cook using healthy oils, such as olive, canola, or sunflower oil. Avoid cooking with butter, cream, or high-fat meats. Meal planning Eat meals and snacks regularly, preferably at the same  times every day. Avoid going long periods of time without eating. Eat foods that are high in fiber, such as fresh fruits, vegetables, beans, and whole grains. Eat 4-6 oz (112-168 g) of lean protein each day, such as lean meat, chicken, fish, eggs, or tofu. One ounce (oz) (28 g) of lean protein is equal to: 1 oz (28 g) of meat, chicken, or fish. 1 egg.  cup (62 g) of tofu. Eat some foods each day that contain healthy fats, such as avocado, nuts, seeds, and fish. What foods should I eat? Fruits Berries. Apples. Oranges. Peaches. Apricots. Plums. Grapes. Mangoes. Papayas. Pomegranates. Kiwi. Cherries. Vegetables Leafy greens, including lettuce, spinach, kale, chard, collard greens, mustard greens, and cabbage. Beets. Cauliflower. Broccoli. Carrots. Green beans. Tomatoes. Peppers. Onions. Cucumbers. Brussels sprouts. Grains Whole grains, such as whole-wheat or whole-grain bread, crackers, tortillas, cereal, and pasta. Unsweetened oatmeal. Quinoa. Brown or wild rice. Meats and other proteins Seafood. Poultry without skin. Lean cuts of poultry and beef. Tofu. Nuts. Seeds. Dairy Low-fat or fat-free dairy products such as milk, yogurt, and cheese. The items listed above may not be a complete list of foods and beverages you can eat and drink. Contact a dietitian for more information. What foods should I avoid? Fruits Fruits canned with syrup. Vegetables Canned vegetables. Frozen vegetables with butter  or cream sauce. Grains Refined white flour and flour products such as bread, pasta, snack foods, and cereals. Avoid all processed foods. Meats and other proteins Fatty cuts of meat. Poultry with skin. Breaded or fried meats. Processed meat. Avoid saturated fats. Dairy Full-fat yogurt, cheese, or milk. Beverages Sweetened drinks, such as soda or iced tea. The items listed above may not be a complete list of foods and beverages you should avoid. Contact a dietitian for more information. Questions to ask a health care provider Do I need to meet with a certified diabetes care and education specialist? Do I need to meet with a dietitian? What number can I call if I have questions? When are the best times to check my blood glucose? Where to find more information: American Diabetes Association: diabetes.org Academy of Nutrition and Dietetics: eatright.[employer] of Diabetes and Digestive and Kidney Diseases: [employer].is Association of Diabetes Care & Education Specialists: diabeteseducator.org Summary It is important to have healthy eating habits because your blood sugar (glucose) levels are greatly affected by what you eat and drink. It is important to use alcohol carefully. A healthy meal plan will help you manage your blood glucose and lower your risk of heart disease. Your health care provider may recommend that you work with a dietitian to make a meal plan that is best for you. This information is not intended to replace advice given to you by your health care provider. Make sure you discuss any questions you have with your health care provider. Document Revised: 12/26/2019 Document Reviewed: 12/26/2019 Elsevier Patient Education  [place]. Heart Failure Action Plan A heart failure action plan helps you understand what to do when you have symptoms of heart failure. Your action plan is a color-coded plan that lists the symptoms to watch for and indicates what actions  to take. If you have symptoms in the red zone, you need medical care right away. If you have symptoms in the yellow zone, you are having problems. If you have symptoms in the green zone, you are doing well. Follow the plan that was created by you and your health care provider. Review your plan each time you  visit your health care provider. Red zone These signs and symptoms mean you should get medical help right away: You have trouble breathing when resting. You have a dry cough that is getting worse. You have swelling or pain in your legs or abdomen that is getting worse. You suddenly gain more than 2-3 lb (0.9-1.4 kg) in 24 hours, or more than 5 lb (2.3 kg) in a week. This amount may be more or less depending on your condition. You have trouble staying awake or you feel confused. You have chest pain. You do not have an appetite. You pass out. You have worsening sadness or depression. If you have any of these symptoms, call your local emergency services (911 in the [place] right away. Do not drive yourself to the hospital. Yellow zone These signs and symptoms mean your condition may be getting worse and you should make some changes: You have trouble breathing when you are active, or you need to sleep with your head raised on extra pillows to help you breathe. You have swelling in your legs or abdomen. You gain 2-3 lb (0.9-1.4 kg) in 24 hours, or 5 lb (2.3 kg) in a week. This amount may be more or less depending on your condition. You get tired easily. You have trouble sleeping. You have a dry cough. If you have any of these symptoms: Contact your health care provider within the next day. Your health care provider may adjust your medicines. Green zone These signs mean you are doing well and can continue what you are doing: You do not have shortness of breath. You have very little swelling or no new swelling. Your weight is stable (no gain or loss). You have a normal activity level. You  do not have chest pain or any other new symptoms. Follow these instructions at home: Take over-the-counter and prescription medicines only as told by your health care provider. Weigh yourself daily. Your target weight is __________ lb (__________ kg). Call your health care provider if you gain more than __________ lb (__________ kg) in 24 hours, or more than __________ lb (__________ kg) in a week. Health care provider name: _____________________________________________________ Health care provider phone number: _____________________________________________________ Eat a heart-healthy diet. Work with a diet and nutrition specialist (dietitian) to create an eating plan that is best for you. Keep all follow-up visits. This is important. Where to find more information American Heart Association: Summary A heart failure action plan helps you understand what to do when you have symptoms of heart failure. Follow the action plan that was created by you and your health care provider. Get help right away if you have any symptoms in the red zone. This information is not intended to replace advice given to you by your health care provider. Make sure you discuss any questions you have with your health care provider. Document Revised: 09/01/2021 Document Reviewed: 01/07/2020 Elsevier Patient Education  [place]. Heart Failure Eating Plan Heart failure, also called congestive heart failure, occurs when your heart does not pump blood well enough to meet your body's needs for oxygen-rich blood. Heart failure is a long-term (chronic) condition. Living with heart failure can be challenging. Following your health care provider's instructions about a healthy lifestyle and working with a dietitian to choose the right foods may help to improve your symptoms. An eating plan for someone with heart failure will include changes that limit the intake of salt (sodium) and unhealthy fat. What are tips for following  this plan? Reading food  labels Check food labels for the amount of sodium per serving. Choose foods that have less than 140 mg (milligrams) of sodium in each serving. Check food labels for the number of calories per serving. This is important if you need to limit your daily calorie intake to lose weight. Check food labels for the serving size. If you eat more than one serving, you will be eating more sodium and calories than what is listed on the label. Look for foods that are labeled as "sodium-free," "very low sodium," or "low sodium." Foods labeled as "reduced sodium" or "lightly salted" may still have more sodium than what is recommended for you. Cooking Avoid adding salt when cooking. Ask your health care provider or dietitian before using salt substitutes. Season food with salt-free seasonings, spices, or herbs. Check the label of seasoning mixes to make sure they do not contain salt. Cook with heart-healthy oils, such as olive, canola, soybean, or sunflower oil. Do not fry foods. Cook foods using low-fat methods, such as baking, boiling, grilling, and broiling. Limit unhealthy fats when cooking by: Removing the skin from poultry, such as chicken. Removing all visible fats from meats. Skimming the fat off from stews, soups, and gravies before serving them. Meal planning  Limit your intake of: Processed, canned, or prepackaged foods. Foods that are high in trans fat, such as fried foods. Sweets, desserts, sugary drinks, and other foods with added sugar. Full-fat dairy products, such as whole milk. Eat a balanced diet. This may include: 4-5 servings of fruit each day and 4-5 servings of vegetables each day. At each meal, try to fill one-half of your plate with fruits and vegetables. Up to 6-8 servings of whole grains each day. Up to 2 servings of lean meat, poultry, or fish each day. One serving of meat is equal to 3 oz (85 g). This is about the same size as a deck of cards. 2 servings  of low-fat dairy each day. Heart-healthy fats. Healthy fats called omega-3 fatty acids are found in foods such as flaxseed and cold-water fish like sardines, salmon, and mackerel. Aim to eat 25-35 g (grams) of fiber a day. Foods that are high in fiber include apples, broccoli, carrots, beans, peas, and whole grains. Do not add salt or condiments that contain salt (such as soy sauce) to foods before eating. When eating at a restaurant, ask that your food be prepared with less salt or no salt, if possible. Try to eat 2 or more vegetarian meals each week. Eat more home-cooked food and eat less restaurant, buffet, and fast food. General information Do not eat more than 2,300 mg of sodium a day. The amount of sodium that is recommended for you may be lower, depending on your condition. Maintain a healthy body weight as directed. Ask your health care provider what a healthy weight is for you. Check your weight every day. Work with your health care provider and dietitian to make a plan that is right for you to lose weight or maintain your current weight. Limit how much fluid you drink. Ask your health care provider or dietitian how much fluid you can have each day. Limit or avoid alcohol as told by your health care provider or dietitian. Recommended foods Fruits All fresh, frozen, and canned fruits. Dried fruits, such as raisins, prunes, and cranberries. Vegetables All fresh vegetables. Vegetables that are frozen without sauce or added salt. Low-sodium or sodium-free canned vegetables. Grains Bread with less than 80 mg of sodium per slice.  Whole-wheat pasta, quinoa, and brown rice. Oats and oatmeal. Barley. [place]. Grits and cream of wheat. Whole-grain and whole-wheat cold cereal. Meats and other protein foods Lean cuts of meat. Skinless chicken and [place]. Fish with high omega-3 fatty acids, such as salmon, sardines, and other cold-water fishes. Eggs. Dried beans, peas, and edamame. Unsalted nuts and  nut butters. Dairy Low-fat or nonfat (skim) milk and dried milk. Rice milk, soy milk, and almond milk. Low-fat or nonfat yogurt. Small amounts of reduced-sodium block cheese. Low-sodium cottage cheese. Fats and oils Olive, canola, soybean, flaxseed, avocado, or sunflower oil. Sweets and desserts Applesauce. Granola bars. Sugar-free pudding and gelatin. Frozen fruit bars. Seasoning and other foods Fresh and dried herbs. Lemon or lime juice. Vinegar. Low-sodium ketchup. Salt-free marinades, salad dressings, sauces, and seasonings. The items listed above may not be a complete list of foods and beverages you can eat. Contact a dietitian for more information. Foods to avoid Fruits Fruits that are dried with sodium-containing preservatives. Vegetables Canned vegetables. Frozen vegetables with sauce or seasonings. Creamed vegetables. [place] fries. Onion rings. Pickled vegetables and sauerkraut. Grains Bread with more than 80 mg of sodium per slice. Hot or cold cereal with more than 140 mg sodium per serving. Salted pretzels and crackers. Prepackaged breadcrumbs. Bagels, croissants, and biscuits. Meats and other protein foods Ribs and chicken wings. Bacon, ham, pepperoni, bologna, salami, and packaged luncheon meats. Hot dogs, bratwurst, and sausage. Canned meat. Smoked meat and fish. Salted nuts and seeds. Dairy Whole milk, half-and-half, and cream. Buttermilk. Processed cheese, cheese spreads, and cheese curds. Regular cottage cheese. Feta cheese. Shredded cheese. String cheese. Fats and oils Butter, lard, shortening, ghee, and bacon fat. Canned and packaged gravies. Seasoning and other foods Onion salt, garlic salt, table salt, and sea salt. Marinades. Regular salad dressings. Relishes, pickles, and olives. Meat flavorings and tenderizers, and bouillon cubes. Horseradish, ketchup, and mustard. Worcestershire sauce. Teriyaki sauce, soy sauce (including reduced sodium). Hot sauce and Tabasco sauce.  Steak sauce, fish sauce, oyster sauce, and cocktail sauce. Taco seasonings. Barbecue sauce. Tartar sauce. The items listed above may not be a complete list of foods and beverages you should avoid. Contact a dietitian for more information. Summary A heart failure eating plan includes changes that limit your intake of sodium and unhealthy fat, and it may help you lose weight or maintain a healthy weight. Your health care provider may also recommend limiting how much fluid you drink. Most people with heart failure should eat no more than 2,300 mg of salt (sodium) a day. The amount of sodium that is recommended for you may be lower, depending on your condition. Contact your health care provider or dietitian before making any major changes to your diet. This information is not intended to replace advice given to you by your health care provider. Make sure you discuss any questions you have with your health care provider. Document Revised: 09/01/2021 Document Reviewed: 01/07/2020 Elsevier Patient Education  [place].

## 2022-07-27 NOTE — Telephone Encounter (Signed)
Requested by interface surescripts. Medication dose discontinued 06/26/22.  Requested Prescriptions  Refused Prescriptions Disp Refills   metFORMIN (GLUCOPHAGE-XR) 500 MG 24 hr tablet [Pharmacy Med Name: METFORMIN HCL ER 500 MG TABLET] 60 tablet 0    Sig: TAKE 1 TABLET BY MOUTH IN THE MORNING AND AT BEDTIME. OFFICE VISIT NEEDED FOR ADDITIONAL REFILLS     Endocrinology:  Diabetes - Biguanides Passed - 07/25/2022  3:24 PM      Passed - Cr in normal range and within 360 days    Creatinine, Ser  Date Value Ref Range Status  07/23/2022 0.89 0.61 - 1.24 mg/dL Final         Passed - HBA1C is between 0 and 7.9 and within 180 days    HB A1C (BAYER DCA - WAIVED)  Date Value Ref Range Status  06/29/2022 6.9 (H) 4.8 - 5.6 % Final    Comment:             Prediabetes: 5.7 - 6.4          Diabetes: >6.4          Glycemic control for adults with diabetes: <7.0          Passed - eGFR in normal range and within 360 days    GFR calc Af Amer  Date Value Ref Range Status  03/28/2020 96 >59 mL/min/1.73 Final    Comment:    **In accordance with recommendations from the NKF-ASN Task force,**   [facility] is in the process of updating its eGFR calculation to the   2021 CKD-EPI creatinine equation that estimates kidney function   without a race variable.    GFR, Estimated  Date Value Ref Range Status  07/23/2022 >60 >60 mL/min Final    Comment:    (NOTE) Calculated using the CKD-EPI Creatinine Equation (2021)    eGFR  Date Value Ref Range Status  06/29/2022 93 >59 mL/min/1.73 Final         Passed - B12 Level in normal range and within 720 days    Vitamin B-12  Date Value Ref Range Status  06/18/2021 304 232 - 1,245 pg/mL Final         Passed - Valid encounter within last 6 months    Recent Outpatient Visits           4 weeks ago Acute gastritis, presence of bleeding unspecified, unspecified gastritis type   [place], [place], DO   1 year ago Routine  general medical examination at a health care facility   [place], [place], DO   1 year ago [place], [place] P, DO   1 year ago Diabetes mellitus without complication [facility])   [place], Megan P, DO   2 years ago Diabetes mellitus without complication [facility])   [place], [place], DO       Future Appointments             In 2 days Valerie Roys, DO [facility]            Passed - CBC within normal limits and completed in the last 12 months    WBC  Date Value Ref Range Status  06/29/2022 3.7 3.4 - 10.8 x10E3/uL Final  06/26/2022 4.4 4.0 - 10.5 K/uL Final   RBC  Date Value Ref Range Status  06/29/2022 4.86 4.14 - 5.80 x10E6/uL Final  06/26/2022 4.31 4.22 - 5.81 MIL/uL Final   Hemoglobin  Date Value Ref Range Status  06/29/2022 14.1 13.0 - 17.7 g/dL Final   Hematocrit  Date Value Ref Range Status  06/29/2022 41.1 37.5 - 51.0 % Final   MCHC  Date Value Ref Range Status  06/29/2022 34.3 31.5 - 35.7 g/dL Final  06/26/2022 33.1 30.0 - 36.0 g/dL Final   [facility]  Date Value Ref Range Status  06/29/2022 29.0 26.6 - 33.0 pg Final  06/26/2022 28.8 26.0 - 34.0 pg Final   MCV  Date Value Ref Range Status  06/29/2022 85 79 - 97 fL Final   No results found for: "PLTCOUNTKUC", "LABPLAT", "POCPLA" RDW  Date Value Ref Range Status  06/29/2022 14.1 11.6 - 15.4 % Final

## 2022-07-27 NOTE — Plan of Care (Signed)
Chronic Care Management Provider Comprehensive Care Plan    07/27/2022 Name: Eugene Henderson MRN: [id number] DOB: [birth year]  Referral to Chronic Care Management (CCM) services was placed by Provider:  Dr. Park Liter on Date: 07-04-2022.  Chronic Condition 1: Heart Failure Provider Assessment and Plan   Euvolemic today. Continue to follow with cardiology. Continue to monitor closely. Call with any concerns.          Relevant Medications    aspirin EC 81 MG tablet     Expected Outcome/Goals Addressed This Visit (Provider CCM goals/Provider Assessment and plan   CCM (HEART FAILURE)  EXPECTED OUTCOME:  MONITOR, SELF-MANAGE AND REDUCE SYMPTOMS OF HEART FAILURE  Symptom Management Condition 1: Take all medications as prescribed Attend all scheduled provider appointments Call provider office for new concerns or questions  call the Suicide and Crisis Lifeline: 988 call the [place] National Suicide Prevention Lifeline: (669)301-2479 or TTY: 5415741601 TTY 5148198340) to talk to a trained counselor call 1-800-273-TALK (toll free, 24 hour hotline) if experiencing a Mental Health or [place]  call office if I gain more than 2 pounds in one day or 5 pounds in one week use salt in moderation watch for swelling in feet, ankles and legs every day weigh myself daily develop a rescue plan follow rescue plan if symptoms flare-up track symptoms and what helps feel better or worse dress right for the weather, hot or cold  Chronic Condition 2: DM Provider Assessment and Plan  aken off his medication for unknown reason. A1c is 6.9. Kidney function is good- will restart metformin and recheck 3 months. Call with any concerns.          Relevant Medications    aspirin EC 81 MG tablet    metFORMIN (GLUCOPHAGE) 500 MG tablet    Other Relevant Orders    Comprehensive metabolic panel (Completed)    CBC with Differential/Platelet (Completed)    Lipid Panel w/o Chol/HDL Ratio  (Completed)    Bayer DCA Hb A1c Waived (Completed)    Ambulatory referral to [place] Referral to Chronic Care Management Services     Expected Outcome/Goals Addressed This Visit (Provider CCM goals/Provider Assessment and plan   CCM (DIABETES) EXPECTED OUTCOME: MONITOR, SELF-MANAGE AND REDUCE SYMPTOMS OF DIABETES   Symptom Management Condition 2: Take all medications as prescribed Attend all scheduled provider appointments Call provider office for new concerns or questions  call the Suicide and Crisis Lifeline: 988 call the [place] National Suicide Prevention Lifeline: 623-366-0280 or TTY: 949-044-4648 [place] (904)779-4344) to talk to a trained counselor call 1-800-273-TALK (toll free, 24 hour hotline) if experiencing a Mental Health or [place]  schedule appointment with eye doctor check blood sugar at prescribed times: when you have symptoms of low or high blood sugar and as prescribed by the provider check feet daily for cuts, sores or redness trim toenails straight across wash and dry feet carefully every day wear comfortable, cotton socks wear comfortable, well-fitting shoes  Problem List Patient Active Problem List   Diagnosis Date Noted   Acute gastritis 06/29/2022   HFrEF (heart failure with reduced ejection fraction) ([place]) 06/16/2022   NICM (nonischemic cardiomyopathy) ([place]) 06/16/2022   Atrial fibrillation with RVR ([place]) 06/15/2022   Thoracoabdominal aortic aneurysm (TAAA) ([place]) 06/15/2022   Abdominal pain 06/14/2022   Metal foreign body in head 06/14/2022   Elevated bilirubin 06/14/2022   Pain in joint of right knee 07/27/2021   Post-traumatic osteoarthritis 07/27/2021   Aortic  atherosclerosis ([place]) [id number]   Acute systolic heart failure (HCC)    AF (paroxysmal atrial fibrillation) ([place])    Hyperlipidemia    Morbid obesity ([place]) 12/11/2018   Chronic pain of right knee 12/11/2018   Advance directive discussed with patient 05/16/2018    Diabetes mellitus with cardiac complication ([place]) 0000000   Keratitis due to infection 07/06/2017   Corneal ulcer 06/24/2017   COPD (chronic obstructive pulmonary disease) ([place]) 02/03/2017   Chronic hepatitis C without hepatic coma ([place]) 12/15/2016   AAA (abdominal aortic aneurysm) ([place]) 11/29/2016   Chronic low back pain 11/29/2016   Alcohol abuse 11/29/2016   Hyponatremia [id number]   Chronic systolic heart failure ([place]) 08/19/2016   Tobacco use 08/19/2016   Persistent atrial fibrillation ([place]) 07/28/2016   Hyperthyroidism 07/28/2016   Noncompliance with medications 07/28/2016   Essential hypertension 07/28/2016    Medication Management  Current Outpatient Medications:    acetaminophen (TYLENOL) 500 MG tablet, Take 500-1,000 mg by mouth every 6 (six) hours as needed for mild pain, fever or headache. , Disp: , Rfl:    albuterol (VENTOLIN HFA) 108 (90 Base) MCG/ACT inhaler, TAKE 2 PUFFS BY MOUTH EVERY 6 HOURS AS NEEDED FOR WHEEZE OR SHORTNESS OF BREATH, Disp: 6.7 each, Rfl: 3   amiodarone (PACERONE) 400 MG tablet, Take 427m daily for 1 week then decrease to 2069mdaily., Disp: 60 tablet, Rfl: 1   apixaban (ELIQUIS) 5 MG TABS tablet, Take 1 tablet (5 mg total) by mouth 2 (two) times daily., Disp: 60 tablet, Rfl: 1   aspirin EC 81 MG tablet, Take 1 tablet (81 mg total) by mouth daily at 6 (six) AM. Swallow whole., Disp: 30 tablet, Rfl: 12   atorvastatin (LIPITOR) 40 MG tablet, TAKE 1 TABLET BY MOUTH EVERY DAY, Disp: 90 tablet, Rfl: 0   budesonide-formoterol (SYMBICORT) 160-4.5 MCG/ACT inhaler, TAKE 2 PUFFS BY MOUTH TWICE A DAY, Disp: 30.6 each, Rfl: 1   digoxin 62.5 MCG TABS, Take 0.0625 mg by mouth daily., Disp: 30 tablet, Rfl: 1   folic acid (FOLVITE) 1 MG tablet, Take 1 mg by mouth daily., Disp: , Rfl:    furosemide (LASIX) 40 MG tablet, TAKE 1 TABLET BY MOUTH EVERY DAY, Disp: 90 tablet, Rfl: 1   hydrocortisone (ANUSOL-HC) 25 MG suppository, One suppository twice a day as needed  for hemmorhoids, (okay to substitute generic) (Patient not taking: Reported on 07/02/2022), Disp: 24 suppository, Rfl: 1   JARDIANCE 10 MG TABS tablet, TAKE 1 TABLET BY MOUTH DAILY BEFORE BREAKFAST., Disp: 30 tablet, Rfl: 5   metFORMIN (GLUCOPHAGE) 500 MG tablet, Take 1 tablet (500 mg total) by mouth 2 (two) times daily with a meal., Disp: 180 tablet, Rfl: 1   metFORMIN (GLUCOPHAGE) 500 MG tablet, Take 500 mg by mouth 2 (two) times daily with a meal. (Patient not taking: Reported on 07/23/2022), Disp: , Rfl:    metoprolol succinate (TOPROL-XL) 50 MG 24 hr tablet, Take 1 tablet (50 mg total) by mouth daily. Take with or immediately following a meal., Disp: 30 tablet, Rfl: 1   nicotine (NICODERM CQ) 7 mg/24hr patch, Place 1 patch (7 mg total) onto the skin daily., Disp: 28 patch, Rfl: 3   pantoprazole (PROTONIX) 40 MG tablet, Take 1 tablet (40 mg total) by mouth 2 (two) times daily., Disp: 60 tablet, Rfl: 1   potassium chloride SA (KLOR-CON M) 20 MEQ tablet, Take 2 tablets (40 mEq total) by mouth daily., Disp: 90 tablet, Rfl: 3   sacubitril-valsartan (ENTRESTO) 49-51 MG, Take  1 tablet by mouth 2 (two) times daily., Disp: 60 tablet, Rfl: 3   spironolactone (ALDACTONE) 25 MG tablet, TAKE 1 TABLET (25 MG TOTAL) BY MOUTH DAILY., Disp: 90 tablet, Rfl: 0   thiamine 100 MG tablet, Take 1 tablet (100 mg total) by mouth daily., Disp: 90 tablet, Rfl: 1  Cognitive Assessment Identity Confirmed: : Name; DOB Cognitive Status: Normal   Functional Assessment Hearing Difficulty or Deaf: no Wear Glasses or Blind: yes 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, wears glasses- has upcoming eye appointment Concentrating, Remembering or Making Decisions Difficulty (CP): no Difficulty Communicating: no Difficulty Eating/Swallowing: no Walking or Climbing Stairs Difficulty: yes Walking or Climbing Stairs: ambulation difficulty, requires equipment Mobility Management: uses a cane, walker, and has an Metallurgist Dressing/Bathing Difficulty: yes Dressing/Bathing: bathing difficulty, assistance 1 person Dressing/Bathing Management: caretaker assist with bathing Doing Errands Independently Difficulty (such as shopping) (CP): yes Errands Management: the patient is dependent on others to help with getting his needs met Change in Functional Status Since Onset of Current Illness/Injury: no   Caregiver Assessment  Primary Source of Support/Comfort: child(ren) Name of Support/Comfort Primary Source: Ellard Artis- daughter People in Home: child(ren), adult Family Caregiver if Needed: child(ren), adult Family Caregiver Names: Wendi-daughter Primary Roles/Responsibilities: retired; disabled   Planned Interventions  Provided education to patient about basic DM disease process. Education on the goal of A1C. The patient is right at goal. Education provided on monitoring for blood sugar changes and what the A1C test was and the goal of keeping A1C <7.0 ; Reviewed medications with patient and discussed importance of medication adherence. The patient is compliant with medications. His daughter assist him with managing his medications. ;        Reviewed prescribed diet with patient heart healthy/ADA diet. Will send planning healthy meals to the patient by mail. Address confirmed; Counseled on importance of regular laboratory monitoring as prescribed;        Discussed plans with patient for ongoing care management follow up and provided patient with direct contact information for care management team;      Provided patient with written educational materials related to hypo and hyperglycemia and importance of correct treatment;       Reviewed scheduled/upcoming provider appointments including: 07-29-2022 at 420 pm- reminder provided today;         Advised patient, providing education and rationale, to check cbg when you have symptoms of low or high blood sugar and as directed by the pcp  and record. Per the daughter the  patient has been getting blood sugar checks from the nurse when they come to the home. The patients daughter states that she does not know what they are but they are good. They have equipment to check blood sugars in the home setting. Education on discussing how often the pcp would like for the patient to check his blood sugars. Next appointment with the pcp on 07-29-2022       call provider for findings outside established parameters;       Referral made to social work team for assistance with support and education for expressed needs. Has talked with the LCSW and has upcoming appointments with the LCSW;      Referral made to community resources care guide team for assistance with has talked with the care guides for assistance with food resources and other expressed needs. 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 Wears glasses but cannot see out of them well. States he needs a new eye exam and she will look into this. DME needs: The patients daughter states that his mobile chair is >56 years old and needs repair. She had PT look at it and they do not believe it can be repaired. The daughter was inquiring about an order for a new chair. Education provided on writing down questions to ask the pcp at upcoming appointment for 07-29-2022 about DME. Also provided the patients daughter with the number to: [employer]: [phone] for additional help with equipment needs as they help patients get needed medical equipment.  She states she needs a wider shower chair for the patient.  Basic overview and discussion of  pathophysiology of Heart Failure reviewed Provided education on low sodium diet. Education provided on heart healthy/ADA diet and will send information in the mail for planning healthy meals. Reviewed Heart Failure Action Plan in depth and provided written copy Assessed need for readable accurate scales in home Provided education about placing scale on hard, flat surface Advised patient to weigh each morning after emptying bladder Discussed importance of daily weight and advised patient to weigh and record daily Reviewed role of diuretics in prevention of fluid overload and management of heart failure. Review of medications and the patients daughter states compliance with medications. She helps the patient manage his medications Discussed the importance of keeping all appointments with provider. 07-29-2022 at 420 pm with the pcp, reminder given today. Provided patient with education about the role of exercise in the management of heart failure. The patients daughter is interested in the patient doing water aerobics as this has helped her with walking and balance. Education provided that this is something to discuss with the pcp at upcoming visit on 07-29-2022,  Referral made to community resources care guide team for assistance with the patients daughter has spoke to the LCSW and the care guide team about resources to help with food and other expressed needs Advised patient to discuss changes in heart failure, questions, and concerns with provider Screening for signs and symptoms of depression related to chronic disease state  Assessed social determinant of health barriers    Interaction and coordination with outside resources, practitioners, and providers See CCM Referral  Care Plan: Printed and mailed to patient

## 2022-07-27 NOTE — Chronic Care Management (AMB) (Signed)
Chronic Care Management   CCM RN Visit Note  07/27/2022 Name: Eugene Henderson MRN: [id number] DOB: 02-Jan-1951  Subjective: Eugene Henderson is a 72 y.o. year old male who is a primary care patient of Carion, Sweger, DO. The patient was referred to the Chronic Care Management team for assistance with care management needs subsequent to provider initiation of CCM services and plan of care.    Today's Visit:   Engaged with the patients daughter and DRP, Filiberto Pinks  for initial visit.     SDOH Interventions Today    Flowsheet Row Most Recent Value  SDOH Interventions   Food Insecurity Interventions Intervention Not Indicated  Housing Interventions Intervention Not Indicated  Transportation Interventions Other (Comment)  [per the patients daughter the patient now has transportation and resources for appointments.]  Utilities Interventions Intervention Not Indicated  Alcohol Usage Interventions Intervention Not Indicated (Score <7)  Financial Strain Interventions Intervention Not Indicated  Physical Activity Interventions Other (Comments)  [working with PT in the home, is interested in water aerobics]  Stress Interventions Intervention Not Indicated  Social Connections Interventions Other (Comment)  [the patient listens to services on tv]         Goals Addressed             This Visit's Progress    CCM Expected Outcome:  Monitor, Self-Manage and Reduce Symptoms of Diabetes       Current Barriers:  Knowledge Deficits related to how often to check blood sugars and the goal of A1C levels Care Coordination needs related to the need for a new motorized chair and asking for recommendations for exercise like water aerobics in a patient with DM Chronic Disease Management support and education needs related to effective management of DM Lab Results  Component Value Date   HGBA1C 6.9 (H) 06/29/2022     Planned Interventions: Provided education to patient about basic DM  disease process. Education on the goal of A1C. The patient is right at goal. Education provided on monitoring for blood sugar changes and what the A1C test was and the goal of keeping A1C <7.0 ; Reviewed medications with patient and discussed importance of medication adherence. The patient is compliant with medications. His daughter assist him with managing his medications. ;        Reviewed prescribed diet with patient heart healthy/ADA diet. Will send planning healthy meals to the patient by mail. Address confirmed; Counseled on importance of regular laboratory monitoring as prescribed;        Discussed plans with patient for ongoing care management follow up and provided patient with direct contact information for care management team;      Provided patient with written educational materials related to hypo and hyperglycemia and importance of correct treatment;       Reviewed scheduled/upcoming provider appointments including: 07-29-2022 at 420 pm- reminder provided today;         Advised patient, providing education and rationale, to check cbg when you have symptoms of low or high blood sugar and as directed by the pcp  and record. Per the daughter the patient has been getting blood sugar checks from the nurse when they come to the home. The patients daughter states that she does not know what they are but they are good. They have equipment to check blood sugars in the home setting. Education on discussing how often the pcp would like for the patient to check his blood sugars. Next appointment with the pcp on 07-29-2022  call provider for findings outside established parameters;       Referral made to social work team for assistance with support and education for expressed needs. Has talked with the LCSW and has upcoming appointments with the LCSW;      Referral made to community resources care guide team for assistance with has talked with the care guides for assistance with food resources and  other expressed needs. 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 Wears glasses but cannot see out of them well. States he needs a new eye exam and she will look into this. DME needs: The patients daughter states that his mobile chair is >93 years old and needs repair. She had PT look at it and they do not believe it can be repaired. The daughter was inquiring about an order for a new chair. Education provided on writing down questions to ask the pcp at upcoming appointment for 07-29-2022 about DME. Also provided the patients daughter with the number to: [employer]: [phone] for additional help with equipment needs as they help patients get needed medical equipment.  She states she needs a wider shower chair for the patient.   Symptom Management: Take medications as prescribed   Attend all scheduled provider appointments Call provider office for new concerns or questions  call the Suicide and Crisis Lifeline: 988 call the [place] National Suicide Prevention Lifeline: (614) 358-6855 or TTY: 813-821-7089 TTY 614-558-1721) to talk to a trained counselor call 1-800-273-TALK (toll free, 24 hour hotline) if experiencing a Mental Health or [place]  schedule appointment with eye doctor check blood sugar at prescribed times: when you have symptoms of low or high blood sugar and as ordered by the pcp  check feet daily for cuts, sores or redness trim toenails straight across wash and dry feet carefully every day wear comfortable, cotton socks wear comfortable, well-fitting shoes  Follow Up Plan:  Telephone follow up appointment with care management team member scheduled for: 09-14-2022 at 145 pm       CCM Expected Outcome:  Monitor, Self-Manage and Reduce Symptoms of Heart Failure       Current Barriers:  Knowledge Deficits related to seeing cardiologist and other specialist on a regular basis to effectively manage HF and other chronic heart conditions.  Care Coordination needs related to medication needs and other expressed needs involving interdisciplinary team involvement in a patient with HF and other chronic conditions Chronic Disease Management support and education needs related to effective management of HF and other chronic heart conditions Wt Readings from Last 3 Encounters:  07/23/22 261 lb 6 oz (118.6 kg)  07/02/22 260 lb (117.9 kg)  06/29/22 261 lb 8 oz (118.6 kg)     Planned Interventions: Basic overview and discussion of pathophysiology of Heart Failure reviewed Provided education on low sodium diet. Education provided on heart healthy/ADA diet and will send information in the mail for planning healthy meals. Reviewed Heart Failure Action Plan in depth and provided written copy Assessed need for readable accurate  scales in home Provided education about placing scale on hard, flat surface Advised patient to weigh each morning after emptying bladder Discussed importance of daily weight and advised patient to weigh and record daily Reviewed role of diuretics in prevention of fluid overload and management of heart failure. Review of medications and the patients daughter states compliance with medications. She helps the patient manage his medications Discussed the importance of keeping all appointments with provider. 07-29-2022 at 420 pm with the pcp, reminder given today. Provided patient with education about the role of exercise in the management of heart failure. The patients daughter is interested in the patient doing water aerobics as this has helped her with walking and  balance. Education provided that this is something to discuss with the pcp at upcoming visit on 07-29-2022,  Referral made to community resources care guide team for assistance with the patients daughter has spoke to the LCSW and the care guide team about resources to help with food and other expressed needs Advised patient to discuss changes in heart failure, questions, and concerns with provider Screening for signs and symptoms of depression related to chronic disease state  Assessed social determinant of health barriers  Symptom Management: Take medications as prescribed   Attend all scheduled provider appointments Call provider office for new concerns or questions  call the Suicide and Crisis Lifeline: 988 call the [place] National Suicide Prevention Lifeline: (253) 839-3882 or TTY: 206-387-5526 TTY 847-410-7527) to talk to a trained counselor call 1-800-273-TALK (toll free, 24 hour hotline) if experiencing a Mental Health or [place]  call office if I gain more than 2 pounds in one day or 5 pounds in one week track weight in diary use salt in moderation watch for swelling in feet, ankles and legs every day weigh myself daily develop a rescue plan follow rescue plan if symptoms flare-up track symptoms and what helps feel better or worse dress right for the weather, hot or cold  Follow Up Plan: Telephone follow up appointment with care management team member scheduled for: 09-14-2022 at 145 pm          Plan:Telephone follow up appointment with care management team member scheduled for:  09-14-2022 at 145 pm  Noreene Larsson RN, MSN, CCM RN Care Manager  Chronic Care Management Direct Number: [phone]

## 2022-07-28 ENCOUNTER — Telehealth (HOSPITAL_COMMUNITY): Payer: Self-pay | Admitting: Cardiology

## 2022-07-28 ENCOUNTER — Telehealth: Payer: Self-pay | Admitting: Family Medicine

## 2022-07-28 DIAGNOSIS — I428 Other cardiomyopathies: Secondary | ICD-10-CM | POA: Diagnosis not present

## 2022-07-28 DIAGNOSIS — J449 Chronic obstructive pulmonary disease, unspecified: Secondary | ICD-10-CM

## 2022-07-28 DIAGNOSIS — I4819 Other persistent atrial fibrillation: Secondary | ICD-10-CM | POA: Diagnosis not present

## 2022-07-28 DIAGNOSIS — I716 Thoracoabdominal aortic aneurysm, without rupture, unspecified: Secondary | ICD-10-CM | POA: Diagnosis not present

## 2022-07-28 DIAGNOSIS — K2900 Acute gastritis without bleeding: Secondary | ICD-10-CM | POA: Diagnosis not present

## 2022-07-28 DIAGNOSIS — F17210 Nicotine dependence, cigarettes, uncomplicated: Secondary | ICD-10-CM | POA: Diagnosis not present

## 2022-07-28 DIAGNOSIS — I110 Hypertensive heart disease with heart failure: Secondary | ICD-10-CM | POA: Diagnosis not present

## 2022-07-28 DIAGNOSIS — I5023 Acute on chronic systolic (congestive) heart failure: Secondary | ICD-10-CM | POA: Diagnosis not present

## 2022-07-28 DIAGNOSIS — E119 Type 2 diabetes mellitus without complications: Secondary | ICD-10-CM | POA: Diagnosis not present

## 2022-07-28 NOTE — Telephone Encounter (Signed)
-----   Message from Larey Dresser, MD sent at 07/26/2022  4:37 PM EST ----- Severe obstruction (COPD) but also evidence for restriction.  He has significant pulmonary disease, please make sure he sees a pulmonologist.

## 2022-07-28 NOTE — Telephone Encounter (Signed)
Spoke with Marlowe Kays from [facility] and provided verbal OK orders per Dr.Kundert for patient. Marlowe Kays verbalized understanding.

## 2022-07-28 NOTE — Telephone Encounter (Signed)
OK for verbal orders?

## 2022-07-28 NOTE — Telephone Encounter (Signed)
Home Health Verbal Orders - Caller/Agency: Connie/ [facility]  Callback Number: [phone] can be left Requesting OT Frequency: 1 x a week for 7 weeks

## 2022-07-28 NOTE — Telephone Encounter (Signed)
Patient called.  Patient aware via daughter Referral placed

## 2022-07-29 ENCOUNTER — Ambulatory Visit (INDEPENDENT_AMBULATORY_CARE_PROVIDER_SITE_OTHER): Payer: Medicare HMO | Admitting: Vascular Surgery

## 2022-07-29 ENCOUNTER — Ambulatory Visit: Payer: Medicare HMO | Admitting: Family Medicine

## 2022-07-29 DIAGNOSIS — K2900 Acute gastritis without bleeding: Secondary | ICD-10-CM | POA: Diagnosis not present

## 2022-07-29 DIAGNOSIS — F17210 Nicotine dependence, cigarettes, uncomplicated: Secondary | ICD-10-CM | POA: Diagnosis not present

## 2022-07-29 DIAGNOSIS — E119 Type 2 diabetes mellitus without complications: Secondary | ICD-10-CM | POA: Diagnosis not present

## 2022-07-29 DIAGNOSIS — J449 Chronic obstructive pulmonary disease, unspecified: Secondary | ICD-10-CM | POA: Diagnosis not present

## 2022-07-29 DIAGNOSIS — I5023 Acute on chronic systolic (congestive) heart failure: Secondary | ICD-10-CM | POA: Diagnosis not present

## 2022-07-29 DIAGNOSIS — I428 Other cardiomyopathies: Secondary | ICD-10-CM | POA: Diagnosis not present

## 2022-07-29 DIAGNOSIS — I716 Thoracoabdominal aortic aneurysm, without rupture, unspecified: Secondary | ICD-10-CM | POA: Diagnosis not present

## 2022-07-29 DIAGNOSIS — I4819 Other persistent atrial fibrillation: Secondary | ICD-10-CM | POA: Diagnosis not present

## 2022-07-29 DIAGNOSIS — I110 Hypertensive heart disease with heart failure: Secondary | ICD-10-CM | POA: Diagnosis not present

## 2022-07-30 ENCOUNTER — Ambulatory Visit: Payer: Self-pay | Admitting: *Deleted

## 2022-07-30 DIAGNOSIS — I110 Hypertensive heart disease with heart failure: Secondary | ICD-10-CM | POA: Diagnosis not present

## 2022-07-30 DIAGNOSIS — I5023 Acute on chronic systolic (congestive) heart failure: Secondary | ICD-10-CM | POA: Diagnosis not present

## 2022-07-30 DIAGNOSIS — I716 Thoracoabdominal aortic aneurysm, without rupture, unspecified: Secondary | ICD-10-CM | POA: Diagnosis not present

## 2022-07-30 DIAGNOSIS — I428 Other cardiomyopathies: Secondary | ICD-10-CM | POA: Diagnosis not present

## 2022-07-30 DIAGNOSIS — F17210 Nicotine dependence, cigarettes, uncomplicated: Secondary | ICD-10-CM | POA: Diagnosis not present

## 2022-07-30 DIAGNOSIS — J449 Chronic obstructive pulmonary disease, unspecified: Secondary | ICD-10-CM | POA: Diagnosis not present

## 2022-07-30 DIAGNOSIS — E119 Type 2 diabetes mellitus without complications: Secondary | ICD-10-CM | POA: Diagnosis not present

## 2022-07-30 DIAGNOSIS — K2900 Acute gastritis without bleeding: Secondary | ICD-10-CM | POA: Diagnosis not present

## 2022-07-30 DIAGNOSIS — I4819 Other persistent atrial fibrillation: Secondary | ICD-10-CM | POA: Diagnosis not present

## 2022-07-30 NOTE — Patient Outreach (Signed)
  Care Coordination   Follow Up Visit Note   07/30/2022 Name: Raam Lindy MRN: [id number] DOB: 14-Nov-1950  Alonza Errico is a 72 y.o. year old male who sees Belal, Alvardo, DO for primary care. I spoke with  Corgan Beigel's daughter by phone today.  What matters to the patients health and wellness today?  Transportation, Meals on Wheels referral, wheelchair repair    Goals Addressed             This Visit's Progress    Transportation, Meals on Wheels, and wheelchair repair       Care Coordination Interventions: Confirmed that patient is now set up with [employer] for transportation, he also has the contact information for [employer] Confirmed with patient's daughter that patient was referred for Meals on Wheels and is now on the waiting list Confirmed that patient continues to receive [facility] services nurse and PT come out 2x per week Patient's  daughter will discuss regarding options for electric wheelchair repair/replacement with provider during next visit          SDOH assessments and interventions completed:  No     Care Coordination Interventions:  Yes, provided  Interventions Today    Flowsheet Row Most Recent Value  Chronic Disease   Chronic disease during today's visit Chronic Obstructive Pulmonary Disease (COPD), Congestive Heart Failure (CHF), Hypertension (HTN)  General Interventions   General Interventions Discussed/Reviewed General Interventions Reviewed, [employer] transportation,  [employer],  Meals on Wheels]       Follow up plan: Follow up call scheduled for 08/13/22    Encounter Outcome:  Pt. Visit Completed

## 2022-07-30 NOTE — Patient Instructions (Signed)
Visit Information  Thank you for taking time to visit with me today. Please don't hesitate to contact me if I can be of assistance to you.   Following are the goals we discussed today:   Goals Addressed             This Visit's Progress    Transportation, Meals on Wheels, and wheelchair repair       Care Coordination Interventions: Confirmed that patient is now set up with [employer] for transportation, he also has the contact information for [employer] Confirmed with patient's daughter that patient was referred for Meals on Wheels and is now on the waiting list Confirmed that patient continues to receive [facility] services nurse and PT come out 2x per week Patient's  daughter will discuss regarding options for electric wheelchair repair/replacement with provider during next visit          Our next appointment is by telephone on 08/13/22 at 11am  Please call the care guide team at [phone] if you need to cancel or reschedule your appointment.   If you are experiencing a Mental Health or [place] or need someone to talk to, please call 911   Patient verbalizes understanding of instructions and care plan provided today and agrees to view in [place]. Active MyChart status and patient understanding of how to access instructions and care plan via MyChart confirmed with patient.     Telephone follow up appointment with care management team member scheduled for: 08/12/21  Elliot Gurney, [place] Worker  [facility] Care Management [phone]

## 2022-08-01 NOTE — Progress Notes (Signed)
MRN : [id number]  Eugene Henderson is a 72 y.o. (12-08-1950) male who presents with chief complaint of check circulation.  History of Present Illness:   The patient returns to the office for surveillance of an abdominal aortic aneurysm status post stent graft placement on 06/17/2022.   Patient denies abdominal pain or back pain, no other abdominal complaints. No groin related complaints. No symptoms consistent with distal embolization No changes in claudication distance or new rest pain symptoms.   There have been no interval changes in his overall healthcare since his last visit.   Patient denies amaurosis fugax or TIA symptoms.  The patient denies recent episodes of angina or shortness of breath.   CT angiogram of the chest dated June 14, 2022 demonstrates ascending thoracic aortic aneurysm measuring 4.7 cm in diameter.    No outpatient medications have been marked as taking for the 08/02/22 encounter (Appointment) with Delana Meyer, Dolores Lory, MD.    Past Medical History:  Diagnosis Date   Arthritis    Ascending aortic aneurysm ([place])    a. 09/2017 Stable TAA - 5.1cm.   Asthma    Chronic systolic CHF (congestive heart failure) ([place])    a. EF 25-30% by echo in 07/2016 with cath showing no significant CAD b. 01/2017: EF 30-35% with diffuse HK and moderate MR; c. 07/2017 Echo: EF 30-35%, diff hK. Mild MR, mildly dil LA. PASP 49mHg.   Diverticulitis    Diverticulitis of large intestine with perforation without abscess or bleeding 05/13/2017   Hypertension    Hyperthyroidism    NICM (nonischemic cardiomyopathy) ([place]    Noncompliance    Persistent atrial fibrillation (HCC)    a. CHA2DS2VASc = 3-->Eliquis (? compliance).    Past Surgical History:  Procedure Laterality Date   CARDIOVERSION N/A 06/23/2022   Procedure: CARDIOVERSION;  Surgeon: SHebert Soho DO;  Location: [facility] ORS;  Service: Cardiovascular;  Laterality: N/A;   CARDIOVERSION N/A 06/25/2022    Procedure: CARDIOVERSION;  Surgeon: SHebert Soho DO;  Location: [facility] ORS;  Service: Cardiovascular;  Laterality: N/A;   COLONOSCOPY N/A 08/27/2019   Procedure: COLONOSCOPY;  Surgeon: Tol[place], TBenay Pike MD;  Location: [facility] ENDOSCOPY;  Service: Gastroenterology;  Laterality: N/A;   ENDOVASCULAR REPAIR/STENT GRAFT N/A 06/17/2022   Procedure: ENDOVASCULAR REPAIR/STENT GRAFT;  Surgeon: DAlgernon Huxley MD;  [facility];  Service: Cardiovascular;  Laterality: N/A;   JOINT REPLACEMENT Right    RIGHT/LEFT HEART CATH AND CORONARY ANGIOGRAPHY N/A 08/02/2016   Procedure: Right/Left Heart Cath and Coronary Angiography;  Surgeon: MWellington Hampshire MD;  [facility];  Service: Cardiovascular;  Laterality: N/A;   TEE WITHOUT CARDIOVERSION N/A 06/23/2022   Procedure: TRANSESOPHAGEAL ECHOCARDIOGRAM;  Surgeon: SHebert Soho DO;  Location: [facility] ORS;  Service: Cardiovascular;  Laterality: N/A;   TESTICLE SURGERY     Patient states that he had to have the tube fixed.    Social History Social History   Tobacco Use   Smoking status: Some Days    Packs/day: 0.25    Years: 25.00    Total pack years: 6.25    Types: Pipe, Cigarettes   Smokeless tobacco: Never   Tobacco comments:    pipe tobacco   Vaping Use   Vaping Use: Never used  Substance Use Topics   Alcohol use: Yes    Comment: Socially - once a month   Drug use: No    Family  History Family History  Problem Relation Age of Onset   Diabetes Mother    Diabetes Father    Heart disease Brother    Heart attack Brother    Diabetes Brother    Kidney failure Brother    Thyroid disease Neg Hx     No Known Allergies   REVIEW OF SYSTEMS (Negative unless checked)  Constitutional: '[]'$ Weight loss  '[]'$ Fever  '[]'$ Chills Cardiac: '[]'$ Chest pain   '[]'$ Chest pressure   '[]'$ Palpitations   '[]'$ Shortness of breath when laying flat   '[]'$ Shortness of breath with exertion. Vascular:  '[x]'$ Pain in legs with walking   '[]'$ Pain in legs at  rest  '[]'$ History of DVT   '[]'$ Phlebitis   '[]'$ Swelling in legs   '[]'$ Varicose veins   '[]'$ Non-healing ulcers Pulmonary:   '[]'$ Uses home oxygen   '[]'$ Productive cough   '[]'$ Hemoptysis   '[]'$ Wheeze  '[]'$ COPD   '[]'$ Asthma Neurologic:  '[]'$ Dizziness   '[]'$ Seizures   '[]'$ History of stroke   '[]'$ History of TIA  '[]'$ Aphasia   '[]'$ Vissual changes   '[]'$ Weakness or numbness in arm   '[]'$ Weakness or numbness in leg Musculoskeletal:   '[]'$ Joint swelling   '[]'$ Joint pain   '[]'$ Low back pain Hematologic:  '[]'$ Easy bruising  '[]'$ Easy bleeding   '[]'$ Hypercoagulable state   '[]'$ Anemic Gastrointestinal:  '[]'$ Diarrhea   '[]'$ Vomiting  '[]'$ Gastroesophageal reflux/heartburn   '[]'$ Difficulty swallowing. Genitourinary:  '[]'$ Chronic kidney disease   '[]'$ Difficult urination  '[]'$ Frequent urination   '[]'$ Blood in urine Skin:  '[]'$ Rashes   '[]'$ Ulcers  Psychological:  '[]'$ History of anxiety   '[]'$  History of major depression.  Physical Examination  There were no vitals filed for this visit. There is no height or weight on file to calculate BMI. Gen: WD/WN, NAD Head: [place]/AT, No temporalis wasting.  Ear/Nose/Throat: Hearing grossly intact, nares w/o erythema or drainage Eyes: PER, EOMI, sclera nonicteric.  Neck: Supple, no masses.  No bruit or JVD.  Pulmonary:  Good air movement, no audible wheezing, no use of accessory muscles.  Cardiac: RRR, normal S1, S2, no Murmurs. Vascular:  mild trophic changes, no open wounds Vessel Right Left  Radial Palpable Palpable  PT Not Palpable Not Palpable  DP Not Palpable Not Palpable  Gastrointestinal: soft, non-distended. No guarding/no peritoneal signs.  Musculoskeletal: M/S 5/5 throughout.  No visible deformity.  Neurologic: CN 2-12 intact. Pain and light touch intact in extremities.  Symmetrical.  Speech is fluent. Motor exam as listed above. Psychiatric: Judgment intact, Mood & affect appropriate for pt's clinical situation. Dermatologic: No rashes or ulcers noted.  No changes consistent with cellulitis.   CBC Lab Results  Component Value Date    WBC 3.7 06/29/2022   HGB 14.1 06/29/2022   HCT 41.1 06/29/2022   MCV 85 06/29/2022   PLT 275 06/29/2022    BMET    Component Value Date/Time   NA 137 07/23/2022 1436   NA 134 06/29/2022 1605   K 3.0 (L) 07/23/2022 1436   CL 102 07/23/2022 1436   CO2 25 07/23/2022 1436   GLUCOSE 117 (H) 07/23/2022 1436   BUN 13 07/23/2022 1436   BUN 15 06/29/2022 1605   CREATININE 0.89 07/23/2022 1436   CALCIUM 8.5 (L) 07/23/2022 1436   GFRNONAA >60 07/23/2022 1436   GFRAA 96 03/28/2020 1524   Estimated Creatinine Clearance: 101.2 mL/min (by C-G formula based on SCr of 0.89 mg/dL).  COAG Lab Results  Component Value Date   INR 1.2 06/25/2022   INR 1.2 06/25/2022   INR 1.2 06/22/2022    Radiology No results found.  Assessment/Plan 1. Abdominal aortic aneurysm (AAA) without rupture, unspecified part ([place]) Recommend:  Patient is status post successful endovascular repair of the AAA.   No further intervention is required at this time.   No endoleak is detected and the aneurysm sac is stable.  The patient will continue antiplatelet therapy as prescribed as well as aggressive management of hyperlipidemia. Exercise is again strongly encouraged.   However, endografts require continued surveillance with ultrasound or CT scan. This is mandatory to detect any changes that allow repressurization of the aneurysm sac.  The patient is informed that this would be asymptomatic.  The patient is reminded that lifelong routine surveillance is a necessity with an endograft. Patient will continue to follow-up at the specified interval with ultrasound of the aorta. - VAS [place] ABI WITH/WO TBI; Future - VAS US AORTA/IVC/ILIACS; Future  2. 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  The patient has an asymptomatic abdominal aortic aneurysm that is greater than 4 cm but less than 5 cm in maximal diameter.    I  have reviewed the natural history of abdominal aortic aneurysm and the small risk of rupture for aneurysm less than 5 cm in size.  However, as these small aneurysms tend to enlarge over time, continued surveillance with ultrasound or CT scan is mandatory.   I have also discussed optimizing medical management with hypertension and lipid control and the importance of abstinence from tobacco.  The patient is also encouraged to exercise a minimum of 30 minutes 4 times a week.   Should the patient develop new onset abdominal or back pain or signs of peripheral embolization they are instructed to seek medical attention immediately and to alert the physician providing care that they have an aneurysm.   The patient voices their understanding.  I have scheduled the patient to return in 6 months with an aortic duplex.  3. Persistent atrial fibrillation (HCC) Continue antiarrhythmia medications as already ordered, these medications have been reviewed and there are no changes at this time.  Continue anticoagulation as ordered by Cardiology Service  4. Essential hypertension Continue antihypertensive medications as already ordered, these medications have been reviewed and there are no changes at this time.  5. Hyperlipidemia, unspecified hyperlipidemia type Continue statin as ordered and reviewed, no changes at this time    Hortencia Pilar, MD  08/01/2022 11:15 AM

## 2022-08-02 ENCOUNTER — Ambulatory Visit (INDEPENDENT_AMBULATORY_CARE_PROVIDER_SITE_OTHER): Payer: Medicare HMO | Admitting: Vascular Surgery

## 2022-08-02 ENCOUNTER — Encounter (INDEPENDENT_AMBULATORY_CARE_PROVIDER_SITE_OTHER): Payer: Self-pay | Admitting: Vascular Surgery

## 2022-08-02 VITALS — BP 113/79 | HR 77 | Resp 16 | Wt 259.0 lb

## 2022-08-02 DIAGNOSIS — E785 Hyperlipidemia, unspecified: Secondary | ICD-10-CM

## 2022-08-02 DIAGNOSIS — I10 Essential (primary) hypertension: Secondary | ICD-10-CM

## 2022-08-02 DIAGNOSIS — I7161 Supraceliac aneurysm of the thoracoabdominal aorta, without rupture: Secondary | ICD-10-CM

## 2022-08-02 DIAGNOSIS — I4819 Other persistent atrial fibrillation: Secondary | ICD-10-CM

## 2022-08-02 DIAGNOSIS — I714 Abdominal aortic aneurysm, without rupture, unspecified: Secondary | ICD-10-CM

## 2022-08-03 ENCOUNTER — Telehealth (HOSPITAL_COMMUNITY): Payer: Self-pay | Admitting: Emergency Medicine

## 2022-08-03 DIAGNOSIS — F17210 Nicotine dependence, cigarettes, uncomplicated: Secondary | ICD-10-CM | POA: Diagnosis not present

## 2022-08-03 DIAGNOSIS — K2900 Acute gastritis without bleeding: Secondary | ICD-10-CM | POA: Diagnosis not present

## 2022-08-03 DIAGNOSIS — J449 Chronic obstructive pulmonary disease, unspecified: Secondary | ICD-10-CM | POA: Diagnosis not present

## 2022-08-03 DIAGNOSIS — I5023 Acute on chronic systolic (congestive) heart failure: Secondary | ICD-10-CM | POA: Diagnosis not present

## 2022-08-03 DIAGNOSIS — I428 Other cardiomyopathies: Secondary | ICD-10-CM | POA: Diagnosis not present

## 2022-08-03 DIAGNOSIS — I110 Hypertensive heart disease with heart failure: Secondary | ICD-10-CM | POA: Diagnosis not present

## 2022-08-03 DIAGNOSIS — I716 Thoracoabdominal aortic aneurysm, without rupture, unspecified: Secondary | ICD-10-CM | POA: Diagnosis not present

## 2022-08-03 DIAGNOSIS — E119 Type 2 diabetes mellitus without complications: Secondary | ICD-10-CM | POA: Diagnosis not present

## 2022-08-03 DIAGNOSIS — I4819 Other persistent atrial fibrillation: Secondary | ICD-10-CM | POA: Diagnosis not present

## 2022-08-03 NOTE — Telephone Encounter (Signed)
Attempted to call patient regarding upcoming cardiac MR appointment. Left message on voicemail with name and callback number Wonder Donaway RN Navigator Cardiac Imaging [facility] [phone] Office [phone] Cell  

## 2022-08-04 ENCOUNTER — Ambulatory Visit
Admission: RE | Admit: 2022-08-04 | Discharge: 2022-08-04 | Disposition: A | Discharge status: Home / Self Care | Payer: Medicare HMO | Source: Ambulatory Visit | Attending: Cardiology | Admitting: Cardiology

## 2022-08-04 ENCOUNTER — Other Ambulatory Visit: Payer: Self-pay | Admitting: Cardiology

## 2022-08-04 DIAGNOSIS — I429 Cardiomyopathy, unspecified: Secondary | ICD-10-CM | POA: Diagnosis not present

## 2022-08-04 MED ORDER — GADOBUTROL 1 MMOL/ML IV SOLN
14.0000 mL | Freq: Once | INTRAVENOUS | Status: AC | PRN
  Administered 2022-08-04: 14 mL via INTRAVENOUS

## 2022-08-05 DIAGNOSIS — E1159 Type 2 diabetes mellitus with other circulatory complications: Secondary | ICD-10-CM

## 2022-08-05 DIAGNOSIS — F17210 Nicotine dependence, cigarettes, uncomplicated: Secondary | ICD-10-CM | POA: Diagnosis not present

## 2022-08-05 DIAGNOSIS — I5020 Unspecified systolic (congestive) heart failure: Secondary | ICD-10-CM | POA: Diagnosis not present

## 2022-08-06 DIAGNOSIS — J449 Chronic obstructive pulmonary disease, unspecified: Secondary | ICD-10-CM | POA: Diagnosis not present

## 2022-08-06 DIAGNOSIS — K2900 Acute gastritis without bleeding: Secondary | ICD-10-CM | POA: Diagnosis not present

## 2022-08-06 DIAGNOSIS — I716 Thoracoabdominal aortic aneurysm, without rupture, unspecified: Secondary | ICD-10-CM | POA: Diagnosis not present

## 2022-08-06 DIAGNOSIS — I4819 Other persistent atrial fibrillation: Secondary | ICD-10-CM | POA: Diagnosis not present

## 2022-08-06 DIAGNOSIS — I5023 Acute on chronic systolic (congestive) heart failure: Secondary | ICD-10-CM | POA: Diagnosis not present

## 2022-08-06 DIAGNOSIS — F17210 Nicotine dependence, cigarettes, uncomplicated: Secondary | ICD-10-CM | POA: Diagnosis not present

## 2022-08-06 DIAGNOSIS — I428 Other cardiomyopathies: Secondary | ICD-10-CM | POA: Diagnosis not present

## 2022-08-06 DIAGNOSIS — I110 Hypertensive heart disease with heart failure: Secondary | ICD-10-CM | POA: Diagnosis not present

## 2022-08-06 DIAGNOSIS — E119 Type 2 diabetes mellitus without complications: Secondary | ICD-10-CM | POA: Diagnosis not present

## 2022-08-07 ENCOUNTER — Encounter (INDEPENDENT_AMBULATORY_CARE_PROVIDER_SITE_OTHER): Payer: Self-pay | Admitting: Vascular Surgery

## 2022-08-09 DIAGNOSIS — I428 Other cardiomyopathies: Secondary | ICD-10-CM | POA: Diagnosis not present

## 2022-08-09 DIAGNOSIS — F17210 Nicotine dependence, cigarettes, uncomplicated: Secondary | ICD-10-CM | POA: Diagnosis not present

## 2022-08-09 DIAGNOSIS — J449 Chronic obstructive pulmonary disease, unspecified: Secondary | ICD-10-CM | POA: Diagnosis not present

## 2022-08-09 DIAGNOSIS — I5023 Acute on chronic systolic (congestive) heart failure: Secondary | ICD-10-CM | POA: Diagnosis not present

## 2022-08-09 DIAGNOSIS — I4819 Other persistent atrial fibrillation: Secondary | ICD-10-CM | POA: Diagnosis not present

## 2022-08-09 DIAGNOSIS — E119 Type 2 diabetes mellitus without complications: Secondary | ICD-10-CM | POA: Diagnosis not present

## 2022-08-09 DIAGNOSIS — I716 Thoracoabdominal aortic aneurysm, without rupture, unspecified: Secondary | ICD-10-CM | POA: Diagnosis not present

## 2022-08-09 DIAGNOSIS — K2900 Acute gastritis without bleeding: Secondary | ICD-10-CM | POA: Diagnosis not present

## 2022-08-09 DIAGNOSIS — I110 Hypertensive heart disease with heart failure: Secondary | ICD-10-CM | POA: Diagnosis not present

## 2022-08-11 DIAGNOSIS — I110 Hypertensive heart disease with heart failure: Secondary | ICD-10-CM | POA: Diagnosis not present

## 2022-08-11 DIAGNOSIS — K2900 Acute gastritis without bleeding: Secondary | ICD-10-CM | POA: Diagnosis not present

## 2022-08-11 DIAGNOSIS — F17210 Nicotine dependence, cigarettes, uncomplicated: Secondary | ICD-10-CM | POA: Diagnosis not present

## 2022-08-11 DIAGNOSIS — I4819 Other persistent atrial fibrillation: Secondary | ICD-10-CM | POA: Diagnosis not present

## 2022-08-11 DIAGNOSIS — I716 Thoracoabdominal aortic aneurysm, without rupture, unspecified: Secondary | ICD-10-CM | POA: Diagnosis not present

## 2022-08-11 DIAGNOSIS — E119 Type 2 diabetes mellitus without complications: Secondary | ICD-10-CM | POA: Diagnosis not present

## 2022-08-11 DIAGNOSIS — I428 Other cardiomyopathies: Secondary | ICD-10-CM | POA: Diagnosis not present

## 2022-08-11 DIAGNOSIS — I5023 Acute on chronic systolic (congestive) heart failure: Secondary | ICD-10-CM | POA: Diagnosis not present

## 2022-08-11 DIAGNOSIS — J449 Chronic obstructive pulmonary disease, unspecified: Secondary | ICD-10-CM | POA: Diagnosis not present

## 2022-08-12 DIAGNOSIS — I4819 Other persistent atrial fibrillation: Secondary | ICD-10-CM | POA: Diagnosis not present

## 2022-08-12 DIAGNOSIS — I716 Thoracoabdominal aortic aneurysm, without rupture, unspecified: Secondary | ICD-10-CM | POA: Diagnosis not present

## 2022-08-12 DIAGNOSIS — J449 Chronic obstructive pulmonary disease, unspecified: Secondary | ICD-10-CM | POA: Diagnosis not present

## 2022-08-12 DIAGNOSIS — I428 Other cardiomyopathies: Secondary | ICD-10-CM | POA: Diagnosis not present

## 2022-08-12 DIAGNOSIS — I5023 Acute on chronic systolic (congestive) heart failure: Secondary | ICD-10-CM | POA: Diagnosis not present

## 2022-08-12 DIAGNOSIS — K2900 Acute gastritis without bleeding: Secondary | ICD-10-CM | POA: Diagnosis not present

## 2022-08-12 DIAGNOSIS — E119 Type 2 diabetes mellitus without complications: Secondary | ICD-10-CM | POA: Diagnosis not present

## 2022-08-12 DIAGNOSIS — F17210 Nicotine dependence, cigarettes, uncomplicated: Secondary | ICD-10-CM | POA: Diagnosis not present

## 2022-08-12 DIAGNOSIS — I110 Hypertensive heart disease with heart failure: Secondary | ICD-10-CM | POA: Diagnosis not present

## 2022-08-13 ENCOUNTER — Ambulatory Visit: Payer: Self-pay | Admitting: *Deleted

## 2022-08-13 NOTE — Patient Instructions (Signed)
Visit Information  Thank you for taking time to visit with me today. Please don't hesitate to contact me if I can be of assistance to you.   Following are the goals we discussed today:   Goals Addressed             This Visit's Progress    Transportation, Meals on Wheels, and wheelchair repair       Care Coordination Interventions: Confirmed that patient is now set up with [facility] transportation as well as Medicaid transportation([employer] transportation) Confirmed with patient's daughter that patient was referred for Meals on Wheels and remains on the waiting list-home visit with the social worker from [employer] to be scheduled once his name comes up  Confirmed that patient continues to receive [facility] services nurse and PT come out 2x per week Patient's  daughter will discuss regarding options for electric wheelchair repair/replacement with provider during next visit with provider on 08/19/22          Please call the care guide team at [phone] if you need to cancel or reschedule your appointment.   If you are experiencing a Mental Health or Gem or need someone to talk to, please call [phone]   Patient verbalizes understanding of instructions and care plan provided today and agrees to view in [place]. Active MyChart status and patient understanding of how to access instructions and care plan via MyChart confirmed with patient.     No further follow up required: patient to call this Education officer, museum with any additional community resource needs  [employer], [place] Worker  [facility] [phone]

## 2022-08-13 NOTE — Patient Outreach (Addendum)
  Care Coordination   Follow Up Visit Note   08/13/2022 Name: Eugene Henderson MRN: [id number] DOB: 1950/10/12  Eugene Henderson is a 72 y.o. year old male who sees Eugene Henderson, [place], Eugene Henderson for primary care. I spoke with  Eugene Henderson's daughter by phone today.  What matters to the patients health and wellness today?  [place], Meals on [employer], and Engineering geologist    Goals Addressed             This Visit's Geneticist, molecular, Meals on Wheels, and wheelchair repair       Care Coordination Interventions: Confirmed that patient is now set up with Eugene Henderson Henderson as well as Medicaid Henderson(Eugene Henderson) Confirmed with patient's daughter that patient was referred for Meals on Wheels and remains on the waiting list-home visit with the social worker from [employer] to be scheduled once his name comes up  Confirmed that patient continues to receive Eugene Henderson services nurse and PT come out 2x per week Patient's  daughter will discuss regarding options for electric wheelchair repair/replacement with provider during next visit with provider on 08/19/22          SDOH assessments and interventions completed:  No     Care Coordination Interventions:  Yes, provided  Interventions Today    Flowsheet Row Most Recent Value  Chronic Disease   Chronic disease during today's visit Chronic Obstructive Pulmonary Disease (COPD), Congestive Heart Failure (CHF), Hypertension (HTN)  General Interventions   General Interventions Discussed/Reviewed --  [patient remains on waiting list for meals on wheels]  Durable Medical Equipment (DME) Wheelchair  Wheelchair Motorized  [Appt rescheduled(08/18/21) with provider to discuss order for motorized wheelchair]       Follow up plan: No further intervention required. Patient to contact this Education officer, museum with any additional community resource needs.  Encounter Outcome:  Pt. Visit Completed

## 2022-08-16 DIAGNOSIS — E119 Type 2 diabetes mellitus without complications: Secondary | ICD-10-CM | POA: Diagnosis not present

## 2022-08-16 DIAGNOSIS — I110 Hypertensive heart disease with heart failure: Secondary | ICD-10-CM | POA: Diagnosis not present

## 2022-08-16 DIAGNOSIS — K2900 Acute gastritis without bleeding: Secondary | ICD-10-CM | POA: Diagnosis not present

## 2022-08-16 DIAGNOSIS — I428 Other cardiomyopathies: Secondary | ICD-10-CM | POA: Diagnosis not present

## 2022-08-16 DIAGNOSIS — I4819 Other persistent atrial fibrillation: Secondary | ICD-10-CM | POA: Diagnosis not present

## 2022-08-16 DIAGNOSIS — I716 Thoracoabdominal aortic aneurysm, without rupture, unspecified: Secondary | ICD-10-CM | POA: Diagnosis not present

## 2022-08-16 DIAGNOSIS — J449 Chronic obstructive pulmonary disease, unspecified: Secondary | ICD-10-CM | POA: Diagnosis not present

## 2022-08-16 DIAGNOSIS — I5023 Acute on chronic systolic (congestive) heart failure: Secondary | ICD-10-CM | POA: Diagnosis not present

## 2022-08-16 DIAGNOSIS — F17210 Nicotine dependence, cigarettes, uncomplicated: Secondary | ICD-10-CM | POA: Diagnosis not present

## 2022-08-17 NOTE — Progress Notes (Unsigned)
Electrophysiology Office Note:    Date:  08/18/2022   ID:  Traci Sermon, DOB Sep 01, 1950, MRN [id number]  [facility] Cardiologist:  Kathlyn Sacramento, MD  [facility] Electrophysiologist:  Vickie Epley, MD   Referring MD: Valerie Roys, DO   Chief Complaint: Atrial fibrillation and heart failure  History of Present Illness:    Eugene Henderson is a 72 y.o. male who I am seeing today for an evaluation of atrial fibrillation and systolic heart failure at the request of Dr. Aundra Dubin.  The patient has a medical history including nonischemic cardiomyopathy, hypertension, diabetes, persistent atrial fibrillation, tobacco abuse.  He last saw Dr. Aundra Dubin July 02, 2022.  At that appointment he reported class II-III heart failure symptoms.  Today he reports feeling better with an improved exercise tolerance.  He is able to move about his house without too much difficulty.  No syncope or presyncope.  He is planning to have a knee replacement surgery but has not scheduled the initial consultation yet.        Their past medical, social and family history was reveiwed.   ROS:   Please see the history of present illness.    All other systems reviewed and are negative.  EKGs/Labs/Other Studies Reviewed:    The following studies were reviewed today:  August 04, 2022 cardiac MRI EF 17% Global hypokinesis Small LGE on the lateral wall . July 02, 2022 EKG shows sinus rhythm with a narrow QRS, 120 ms  EKG today shows atrial flutter with a ventricular rate of 83 bpm.   Physical Exam:    VS:  BP 112/68   Pulse 83   Ht 6' (1.829 m)   Wt 261 lb (118.4 kg)   SpO2 98%   BMI 35.40 kg/m     Wt Readings from Last 3 Encounters:  08/18/22 261 lb (118.4 kg)  08/02/22 259 lb (117.5 kg)  07/23/22 261 lb 6 oz (118.6 kg)     GEN:  Well nourished, well developed in no acute distress.  Obese CARDIAC: RRR, no murmurs, rubs, gallops RESPIRATORY:  Clear to auscultation without  rales, wheezing or rhonchi       ASSESSMENT AND PLAN:    1. Persistent atrial fibrillation ([place])   2. Chronic systolic heart failure ([place])   3. Encounter for long-term (current) use of high-risk medication      #Persistent atrial fibrillation and flutter, possibly permanent #High risk med monitoring-amiodarone Is back out of rhythm despite treatment with amiodarone.  I would consider his atrial flutter is permanent at this time.  I suspect some of his improved symptoms are due to improved rate control.   Currently taking amiodarone 200 mg by mouth once daily.  Would continue this and then plan for repeat cardioversion in 6 or 8 weeks. On Eliquis for stroke prophylaxis Liver and thyroid function checked in January and were stable.  Before considering any ablative option for his arrhythmia, would need to document that he can maintain normal rhythm on amiodarone.  I suspect he may be approaching permanent atrial fibrillation/flutter.  #Chronic systolic heart failure NYHA class III.  Warm and dry on exam.  Follows with Dr. Aundra Dubin in the heart failure clinic.  EF 17%.  The patient has an non ischemic CM (EF 17%), NYHA Class III CHF, and CAD.  He is referred by Dr Aundra Dubin for risk stratification of sudden death and consideration of ICD implantation.  At this time, he meets criteria for ICD implantation for primary prevention  of sudden death.  I have had a thorough discussion with the patient reviewing options.  The patient and their family (if available) have had opportunities to ask questions and have them answered.   Risks, benefits, alternatives to ICD implantation were discussed in detail with the patient today. The patient understands that the risks include but are not limited to bleeding, infection, pneumothorax, perforation, tamponade, vascular damage, renal failure, MI, stroke, death, inappropriate shocks, and lead dislodgement.  He is very clear that he does not want to proceed with  ICD implant.  He does not want anything implanted inside of his body.  He will reach out should he change his mind.  I did provide him some information about ICD implant today.      Follow-up in 8 weeks with APP.  If remains in atrial flutter, schedule cardioversion.  If fails that cardioversion, consider his atrial arrhythmia permanent.      Signed, Hilton Cork. Quentin Ore, MD, [facility], [facility] 08/18/2022 2:02 PM    Electrophysiology [facility]

## 2022-08-18 ENCOUNTER — Encounter: Payer: Self-pay | Admitting: Cardiology

## 2022-08-18 ENCOUNTER — Ambulatory Visit: Payer: Medicare HMO | Attending: Cardiology | Admitting: Cardiology

## 2022-08-18 VITALS — BP 112/68 | HR 83 | Ht 72.0 in | Wt 261.0 lb

## 2022-08-18 DIAGNOSIS — I5022 Chronic systolic (congestive) heart failure: Secondary | ICD-10-CM | POA: Diagnosis not present

## 2022-08-18 DIAGNOSIS — Z79899 Other long term (current) drug therapy: Secondary | ICD-10-CM

## 2022-08-18 DIAGNOSIS — I4819 Other persistent atrial fibrillation: Secondary | ICD-10-CM

## 2022-08-18 NOTE — Patient Instructions (Signed)
Medication Instructions:  Your physician recommends that you continue on your current medications as directed. Please refer to the Current Medication list given to you today.  *If you need a refill on your cardiac medications before your next appointment, please call your pharmacy*  Follow-Up: At [facility], you and your health needs are our priority.  As part of our continuing mission to provide you with exceptional heart care, we have created designated Provider Care Teams.  These Care Teams include your primary Cardiologist (physician) and Advanced Practice Providers (APPs -  Physician Assistants and Nurse Practitioners) who all work together to provide you with the care you need, when you need it.  Your next appointment:   As needed with Dr. Lambert 

## 2022-08-19 ENCOUNTER — Ambulatory Visit (INDEPENDENT_AMBULATORY_CARE_PROVIDER_SITE_OTHER): Payer: Medicare HMO | Admitting: Family Medicine

## 2022-08-19 ENCOUNTER — Encounter: Payer: Self-pay | Admitting: Family Medicine

## 2022-08-19 VITALS — BP 137/89 | HR 82 | Temp 98.4°F

## 2022-08-19 DIAGNOSIS — B182 Chronic viral hepatitis C: Secondary | ICD-10-CM

## 2022-08-19 DIAGNOSIS — I716 Thoracoabdominal aortic aneurysm, without rupture, unspecified: Secondary | ICD-10-CM | POA: Diagnosis not present

## 2022-08-19 DIAGNOSIS — K2900 Acute gastritis without bleeding: Secondary | ICD-10-CM | POA: Diagnosis not present

## 2022-08-19 DIAGNOSIS — I428 Other cardiomyopathies: Secondary | ICD-10-CM | POA: Diagnosis not present

## 2022-08-19 DIAGNOSIS — F17210 Nicotine dependence, cigarettes, uncomplicated: Secondary | ICD-10-CM | POA: Diagnosis not present

## 2022-08-19 DIAGNOSIS — I4891 Unspecified atrial fibrillation: Secondary | ICD-10-CM

## 2022-08-19 DIAGNOSIS — I4819 Other persistent atrial fibrillation: Secondary | ICD-10-CM | POA: Diagnosis not present

## 2022-08-19 DIAGNOSIS — I110 Hypertensive heart disease with heart failure: Secondary | ICD-10-CM | POA: Diagnosis not present

## 2022-08-19 DIAGNOSIS — Z0000 Encounter for general adult medical examination without abnormal findings: Secondary | ICD-10-CM

## 2022-08-19 DIAGNOSIS — E119 Type 2 diabetes mellitus without complications: Secondary | ICD-10-CM | POA: Diagnosis not present

## 2022-08-19 DIAGNOSIS — I5023 Acute on chronic systolic (congestive) heart failure: Secondary | ICD-10-CM | POA: Diagnosis not present

## 2022-08-19 DIAGNOSIS — J449 Chronic obstructive pulmonary disease, unspecified: Secondary | ICD-10-CM | POA: Diagnosis not present

## 2022-08-19 NOTE — Progress Notes (Signed)
BP 137/89 (BP Location: Left Arm, Cuff Size: Normal)   Pulse 82   Temp 98.4 F (36.9 C) (Oral)   SpO2 98%    Subjective:    Patient ID: Eugene Henderson, male    DOB: [birth year], 72 y.o.   MRN: [id number]  HPI: Eugene Henderson is a 72 y.o. male presenting on 08/19/2022 for comprehensive medical examination. Current medical complaints include:  ATRIAL FIBRILLATION- saw electrophysiologist yesterday. Repeat cardioversion in 6-8 weeks. Consideration of implantable ICD, but he does not want that. Will follow up with them in 2 months Atrial fibrillation status: uncontrolled Satisfied with current treatment: no Medication side effects:  no Medication compliance: good compliance Palpitations:  no Chest pain:  no Dyspnea on exertion:  no Orthopnea:  no Syncope:  no Edema:  no Ventricular rate control: B-blocker Anti-coagulation: long acting  Saw vascular- No changes in plan. Continue to monitor.  No more belly pain. It is resolved.   He currently lives with: daughter and family Interim Problems from his last visit: no  Functional Status Survey: Is the patient deaf or have difficulty hearing?: No Does the patient have difficulty seeing, even when wearing glasses/contacts?: Yes Does the patient have difficulty concentrating, remembering, or making decisions?: Yes Does the patient have difficulty walking or climbing stairs?: Yes Does the patient have difficulty dressing or bathing?: No Does the patient have difficulty doing errands alone such as visiting a doctor's office or shopping?: Yes  FALL RISK:    08/19/2022    2:07 PM 07/27/2022    2:52 PM 06/29/2022    3:32 PM 06/18/2021    2:22 PM 06/18/2021    1:57 PM  [place] in the past year? 0 0 0 0 0  Number falls in past yr: 0 0 0 0 0  Injury with Fall? 0 0 0 0 0  Risk for fall due to :  Impaired vision;Impaired balance/gait;Impaired mobility No Fall Risks  No Fall Risks  Follow up  Falls evaluation completed;Education  provided;Falls prevention discussed Falls evaluation completed  Falls evaluation completed    Depression Screen    08/19/2022    2:06 PM 06/29/2022    3:32 PM 06/18/2021    2:22 PM 10/30/2020   12:07 PM 07/11/2020    1:13 PM  Depression screen PHQ 2/9  Decreased Interest 0 0 3 0 0  Down, Depressed, Hopeless 1 2 1  0 0  PHQ - 2 Score 1 2 4  0 0  Altered sleeping 2 3 2     Tired, decreased energy 2 3 3     Change in appetite 3 2 0    Feeling bad or failure about yourself  0 1 1    Trouble concentrating 1 2 1     Moving slowly or fidgety/restless 0 0 0    Suicidal thoughts 0 0 0    PHQ-9 Score 9 13 11     Difficult doing work/chores Not difficult at all Extremely dIfficult Somewhat difficult      Advanced Directives Does patient have a HCPOA?    no Does patient have a living will or MOST form?  no  Past Medical History:  Past Medical History:  Diagnosis Date   Arthritis    Ascending aortic aneurysm ([place])    a. 09/2017 Stable TAA - 5.1cm.   Asthma    Chronic systolic CHF (congestive heart failure) ([place])    a. EF 25-30% by echo in 07/2016 with cath showing no significant CAD b. 01/2017: EF  30-35% with diffuse HK and moderate MR; c. 07/2017 Echo: EF 30-35%, diff hK. Mild MR, mildly dil LA. PASP 15mmHg.   Diverticulitis    Diverticulitis of large intestine with perforation without abscess or bleeding 05/13/2017   Hypertension    Hyperthyroidism    NICM (nonischemic cardiomyopathy) ([place])    Noncompliance    Persistent atrial fibrillation (HCC)    a. CHA2DS2VASc = 3-->Eliquis (? compliance).    Surgical History:  Past Surgical History:  Procedure Laterality Date   CARDIOVERSION N/A 06/23/2022   Procedure: CARDIOVERSION;  Surgeon: Hebert Soho, DO;  Location: [facility];  Service: Cardiovascular;  Laterality: N/A;   CARDIOVERSION N/A 06/25/2022   Procedure: CARDIOVERSION;  Surgeon: Hebert Soho, DO;  Location: [facility];  Service: Cardiovascular;  Laterality: N/A;   COLONOSCOPY  N/A 08/27/2019   Procedure: COLONOSCOPY;  Surgeon: Toledo, Benay Pike, MD;  Location: [facility];  Service: Gastroenterology;  Laterality: N/A;   ENDOVASCULAR REPAIR/STENT GRAFT N/A 06/17/2022   Procedure: ENDOVASCULAR REPAIR/STENT GRAFT;  Surgeon: Algernon Huxley, MD;  Location: [place] [facility];  Service: Cardiovascular;  Laterality: N/A;   JOINT REPLACEMENT Right    RIGHT/LEFT HEART CATH AND CORONARY ANGIOGRAPHY N/A 08/02/2016   Procedure: Right/Left Heart Cath and Coronary Angiography;  Surgeon: Wellington Hampshire, MD;  Location: [place] [facility];  Service: Cardiovascular;  Laterality: N/A;   TEE WITHOUT CARDIOVERSION N/A 06/23/2022   Procedure: TRANSESOPHAGEAL ECHOCARDIOGRAM;  Surgeon: Hebert Soho, DO;  Location: [facility];  Service: Cardiovascular;  Laterality: N/A;   TESTICLE SURGERY     Patient states that he had to have the tube fixed.    Medications:  Current Outpatient Medications on File Prior to Visit  Medication Sig   acetaminophen (TYLENOL) 500 MG tablet Take 500-1,000 mg by mouth every 6 (six) hours as needed for mild pain, fever or headache.    albuterol (VENTOLIN HFA) 108 (90 Base) MCG/ACT inhaler TAKE 2 PUFFS BY MOUTH EVERY 6 HOURS AS NEEDED FOR WHEEZE OR SHORTNESS OF BREATH   amiodarone (PACERONE) 400 MG tablet Take 400mg  daily for 1 week then decrease to 200mg  daily.   apixaban (ELIQUIS) 5 MG TABS tablet Take 1 tablet (5 mg total) by mouth 2 (two) times daily.   aspirin EC 81 MG tablet Take 1 tablet (81 mg total) by mouth daily at 6 (six) AM. Swallow whole.   budesonide-formoterol (SYMBICORT) 160-4.5 MCG/ACT inhaler TAKE 2 PUFFS BY MOUTH TWICE A DAY   digoxin 62.5 MCG TABS Take 0.0625 mg by mouth daily.   folic acid (FOLVITE) 1 MG tablet Take 1 mg by mouth daily.   hydrocortisone (ANUSOL-HC) 25 MG suppository One suppository twice a day as needed for hemmorhoids, (okay to substitute generic)   JARDIANCE 10 MG TABS tablet TAKE 1 TABLET BY MOUTH DAILY BEFORE  BREAKFAST.   metFORMIN (GLUCOPHAGE) 500 MG tablet Take 1 tablet (500 mg total) by mouth 2 (two) times daily with a meal.   metFORMIN (GLUCOPHAGE) 500 MG tablet Take 500 mg by mouth 2 (two) times daily with a meal.   metoprolol succinate (TOPROL-XL) 50 MG 24 hr tablet Take 1 tablet (50 mg total) by mouth daily. Take with or immediately following a meal.   nicotine (NICODERM CQ) 7 mg/24hr patch Place 1 patch (7 mg total) onto the skin daily.   pantoprazole (PROTONIX) 40 MG tablet Take 1 tablet (40 mg total) by mouth 2 (two) times daily.   potassium chloride SA (KLOR-CON M) 20 MEQ tablet Take 2 tablets (40  mEq total) by mouth daily.   sacubitril-valsartan (ENTRESTO) 49-51 MG Take 1 tablet by mouth 2 (two) times daily.   spironolactone (ALDACTONE) 25 MG tablet TAKE 1 TABLET (25 MG TOTAL) BY MOUTH DAILY.   thiamine 100 MG tablet Take 1 tablet (100 mg total) by mouth daily.   No current facility-administered medications on file prior to visit.    Allergies:  No Known Allergies  Social History:  Social History   Socioeconomic History   Marital status: Single    Spouse name: Not on file   Number of children: Not on file   Years of education: Not on file   Highest education level: Not on file  Occupational History   Occupation: retired  Tobacco Use   Smoking status: Some Days    Packs/day: 0.25    Years: 25.00    Additional pack years: 0.00    Total pack years: 6.25    Types: Pipe, Cigarettes   Smokeless tobacco: Never   Tobacco comments:    pipe tobacco   Vaping Use   Vaping Use: Never used  Substance and Sexual Activity   Alcohol use: Yes    Comment: Socially - once a month   Drug use: No   Sexual activity: Not Currently  Other Topics Concern   Not on file  Social History Narrative   Not on file   Social Determinants of Health   Financial Resource Strain: Low Risk  (07/27/2022)   Overall Financial Resource Strain (CARDIA)    Difficulty of Paying Living Expenses: Not very  hard  Food Insecurity: No Food Insecurity (07/27/2022)   Hunger Vital Sign    Worried About Running Out of Food in the Last Year: Never true    Ran Out of Food in the Last Year: Never true  Transportation Needs: No Transportation Needs (07/27/2022)   PRAPARE - Hydrologist (Medical): No    Lack of Transportation (Non-Medical): No  Recent Concern: Transportation Needs - Unmet Transportation Needs (07/12/2022)   PRAPARE - Hydrologist (Medical): Yes    Lack of Transportation (Non-Medical): No  Physical Activity: Inactive (07/27/2022)   Exercise Vital Sign    Days of Exercise per Week: 0 days    Minutes of Exercise per Session: 0 min  Stress: No Stress Concern Present (07/27/2022)   [place]    Feeling of Stress : Not at all  Social Connections: Socially Isolated (07/27/2022)   Social Connection and Isolation Panel [NHANES]    Frequency of Communication with Friends and Family: More than three times a week    Frequency of Social Gatherings with Friends and Family: More than three times a week    Attends Religious Services: Never    Marine scientist or Organizations: No    Attends Archivist Meetings: Never    Marital Status: Divorced  Human resources officer Violence: Not At Risk (07/27/2022)   Humiliation, Afraid, Rape, and Kick questionnaire    Fear of Current or Ex-Partner: No    Emotionally Abused: No    Physically Abused: No    Sexually Abused: No   Social History   Tobacco Use  Smoking Status Some Days   Packs/day: 0.25   Years: 25.00   Additional pack years: 0.00   Total pack years: 6.25   Types: Pipe, Cigarettes  Smokeless Tobacco Never  Tobacco Comments   pipe tobacco  Social History   Substance and Sexual Activity  Alcohol Use Yes   Comment: Socially - once a month    Family History:  Family History  Problem Relation Age of  Onset   Diabetes Mother    Diabetes Father    Heart disease Brother    Heart attack Brother    Diabetes Brother    Kidney failure Brother    Thyroid disease Neg Hx     Past medical history, surgical history, medications, allergies, family history and social history reviewed with patient today and changes made to appropriate areas of the chart.   Review of Systems  Constitutional: Negative.   HENT: Negative.    Respiratory: Negative.    Cardiovascular: Negative.   Genitourinary: Negative.   Musculoskeletal: Negative.   Skin: Negative.   Neurological: Negative.   Psychiatric/Behavioral: Negative.     All other ROS negative except what is listed above and in the HPI.      Objective:    BP 137/89 (BP Location: Left Arm, Cuff Size: Normal)   Pulse 82   Temp 98.4 F (36.9 C) (Oral)   SpO2 98%   Wt Readings from Last 3 Encounters:  08/18/22 261 lb (118.4 kg)  08/02/22 259 lb (117.5 kg)  07/23/22 261 lb 6 oz (118.6 kg)    No results found.  Physical Exam Vitals and nursing note reviewed.  Constitutional:      General: He is not in acute distress.    Appearance: Normal appearance. He is obese. He is not ill-appearing, toxic-appearing or diaphoretic.  HENT:     Head: Normocephalic and atraumatic.     Right Ear: Tympanic membrane, ear canal and external ear normal. There is no impacted cerumen.     Left Ear: Tympanic membrane, ear canal and external ear normal. There is no impacted cerumen.     Nose: Nose normal. No congestion or rhinorrhea.     Mouth/Throat:     Mouth: Mucous membranes are moist.     Pharynx: Oropharynx is clear. No oropharyngeal exudate or posterior oropharyngeal erythema.  Eyes:     General: No scleral icterus.       Right eye: No discharge.        Left eye: No discharge.     Extraocular Movements: Extraocular movements intact.     Conjunctiva/sclera: Conjunctivae normal.     Pupils: Pupils are equal, round, and reactive to light.  Neck:      Vascular: No carotid bruit.  Cardiovascular:     Rate and Rhythm: Normal rate and regular rhythm.     Pulses: Normal pulses.     Heart sounds: No murmur heard.    No friction rub. No gallop.  Pulmonary:     Effort: Pulmonary effort is normal. No respiratory distress.     Breath sounds: Normal breath sounds. No stridor. No wheezing, rhonchi or rales.  Chest:     Chest wall: No tenderness.  Abdominal:     General: Abdomen is flat. Bowel sounds are normal. There is no distension.     Palpations: Abdomen is soft. There is no mass.     Tenderness: There is no abdominal tenderness. There is no right CVA tenderness, left CVA tenderness, guarding or rebound.     Hernia: No hernia is present.  Genitourinary:    Comments: Genital exam deferred with shared decision making Musculoskeletal:        General: No swelling, tenderness, deformity or signs of injury.  Cervical back: Normal range of motion and neck supple. No rigidity. No muscular tenderness.     Right lower leg: No edema.     Left lower leg: No edema.  Lymphadenopathy:     Cervical: No cervical adenopathy.  Skin:    General: Skin is warm and dry.     Capillary Refill: Capillary refill takes less than 2 seconds.     Coloration: Skin is not jaundiced or pale.     Findings: No bruising, erythema, lesion or rash.  Neurological:     General: No focal deficit present.     Mental Status: He is alert and oriented to person, place, and time.     Cranial Nerves: No cranial nerve deficit.     Sensory: No sensory deficit.     Motor: No weakness.     Coordination: Coordination normal.     Gait: Gait normal.     Deep Tendon Reflexes: Reflexes normal.  Psychiatric:        Mood and Affect: Mood normal.        Behavior: Behavior normal.        Thought Content: Thought content normal.        Judgment: Judgment normal.        08/19/2022    2:11 PM 08/19/2022    2:09 PM 07/11/2020    1:17 PM 05/16/2018    2:46 PM  6CIT Screen  What  Year? 0 points 0 points 0 points 0 points  What month? 0 points 0 points 0 points 0 points  What time? 0 points 0 points 0 points 0 points  Count back from 20 0 points 0 points 0 points 0 points  Months in reverse 4 points 4 points 2 points 2 points  Repeat phrase 8 points 8 points 2 points 2 points  Total Score 12 points 12 points 4 points 4 points     Results for orders placed or performed in visit on 0000000  Basic metabolic panel  Result Value Ref Range   Sodium 137 135 - 145 mmol/L   Potassium 3.0 (L) 3.5 - 5.1 mmol/L   Chloride 102 98 - 111 mmol/L   CO2 25 22 - 32 mmol/L   Glucose, Bld 117 (H) 70 - 99 mg/dL   BUN 13 8 - 23 mg/dL   Creatinine, Ser 0.89 0.61 - 1.24 mg/dL   Calcium 8.5 (L) 8.9 - 10.3 mg/dL   GFR, Estimated >60 >60 mL/min   Anion gap 10 5 - 15      Assessment & Plan:   Problem List Items Addressed This Visit       Cardiovascular and Mediastinum   Atrial fibrillation with [facility])    Following with cardiology. Discussed implantable ICD- he is more open to it. Continue medicine. Continue to follow with cardiology. Call with any concerns.         Digestive   Chronic hepatitis C without hepatic coma ([place])    S/P treatment. Call with any concerns.       Other Visit Diagnoses     Encounter for annual wellness exam in Medicare patient    -  Primary   Preventative care discussed today. Call with any concerns.        Preventative Services:  Health Risk Assessment and Personalized Prevention Plan: Done today Bone Mass Measurements: N/A CVD Screening: Up to date Colon Cancer Screening: up to date Depression Screening:  done today Diabetes Screening: up to date Glaucoma Screening: see  your eye doctor Hepatitis B vaccine: N/A Hepatitis C screening: up to date HIV Screening: up to date Flu Vaccine: up to date Lung cancer Screening: N/A Obesity Screening: done today Pneumonia Vaccines (2): up to date STI Screening: N/A PSA screening: up to  date  Discussed aspirin prophylaxis for myocardial infarction prevention and decision was it was not indicated  LABORATORY TESTING:  Health maintenance labs ordered today as discussed above.   IMMUNIZATIONS:   - Tdap: Tetanus vaccination status reviewed: last tetanus booster within 10 years. - Influenza: Up to date - Pneumovax: Up to date - Prevnar: Up to date - Zostavax vaccine: Refused  SCREENING: - Colonoscopy: Up to date  Discussed with patient purpose of the colonoscopy is to detect colon cancer at curable precancerous or early stages   - AAA Screening: Up to date   PATIENT COUNSELING:    Sexuality: Discussed sexually transmitted diseases, partner selection, use of condoms, avoidance of unintended pregnancy  and contraceptive alternatives.   Advised to avoid cigarette smoking.  I discussed with the patient that most people either abstain from alcohol or drink within safe limits (<=14/week and <=4 drinks/occasion for males, <=7/weeks and <= 3 drinks/occasion for females) and that the risk for alcohol disorders and other health effects rises proportionally with the number of drinks per week and how often a drinker exceeds daily limits.  Discussed cessation/primary prevention of drug use and availability of treatment for abuse.   Diet: Encouraged to adjust caloric intake to maintain  or achieve ideal body weight, to reduce intake of dietary saturated fat and total fat, to limit sodium intake by avoiding high sodium foods and not adding table salt, and to maintain adequate dietary potassium and calcium preferably from fresh fruits, vegetables, and low-fat dairy products.    stressed the importance of regular exercise  Injury prevention: Discussed safety belts, safety helmets, smoke detector, smoking near bedding or upholstery.   Dental health: Discussed importance of regular tooth brushing, flossing, and dental visits.   Follow up plan: NEXT PREVENTATIVE PHYSICAL DUE IN 1  YEAR. No follow-ups on file.

## 2022-08-19 NOTE — Progress Notes (Deleted)
   BP (!) 145/81 (BP Location: Left Arm, Cuff Size: Normal)   Pulse 87   Temp 98.4 F (36.9 C) (Oral)   SpO2 98%    Subjective:    Patient ID: Eugene Henderson, male    DOB: Nov 07, 1950, 72 y.o.   MRN: [id number]  HPI: Eugene Henderson is a 72 y.o. male  Chief Complaint  Patient presents with   Atrial Fibrillation    Patient says he was told yesterday by his Cardiologist that he needed to follow up with Pacemaker specialist and patient wants to discuss with provider at today's visit.    ATRIAL FIBRILLATION- saw electrophysiologist yesterday. Repeat cardioversion in 6-8 weeks. Consideration of implantable ICD, but he does not want that. Will follow up with them in 2 months Atrial fibrillation status: {Blank single:19197::"controlled","uncontrolled","better","worse","exacerbated","stable"} Satisfied with current treatment: {Blank single:19197::"yes","no"}  Medication side effects:  {Blank single:19197::"yes","no"} Medication compliance: {Blank single:19197::"excellent compliance","good compliance","fair compliance","poor compliance"} Etiology of atrial fibrillation:  Palpitations:  {Blank single:19197::"yes","no"} Chest pain:  {Blank single:19197::"yes","no"} Dyspnea on exertion:  {Blank single:19197::"yes","no"} Orthopnea:  {Blank single:19197::"yes","no"} Syncope:  {Blank single:19197::"yes","no"} Edema:  {Blank single:19197::"yes","no"} Ventricular rate control: {Blank single:19197::"Not indicated","diltiazem","verapamil","B-blocker"} Anti-coagulation: {Blank single:19197::"not indicated","aspirin","warfarin","long acting"}  Saw vascular- No changes  No more belly pain. It is resolved.   Relevant past medical, surgical, family and social history reviewed and updated as indicated. Interim medical history since our last visit reviewed. Allergies and medications reviewed and updated.  Review of Systems  Per HPI unless specifically indicated above     Objective:    BP (!) 145/81  (BP Location: Left Arm, Cuff Size: Normal)   Pulse 87   Temp 98.4 F (36.9 C) (Oral)   SpO2 98%   Wt Readings from Last 3 Encounters:  08/18/22 261 lb (118.4 kg)  08/02/22 259 lb (117.5 kg)  07/23/22 261 lb 6 oz (118.6 kg)    Physical Exam  Results for orders placed or performed in visit on 63/01/60  Basic metabolic panel  Result Value Ref Range   Sodium 137 135 - 145 mmol/L   Potassium 3.0 (L) 3.5 - 5.1 mmol/L   Chloride 102 98 - 111 mmol/L   CO2 25 22 - 32 mmol/L   Glucose, Bld 117 (H) 70 - 99 mg/dL   BUN 13 8 - 23 mg/dL   Creatinine, Ser 0.89 0.61 - 1.24 mg/dL   Calcium 8.5 (L) 8.9 - 10.3 mg/dL   GFR, Estimated >60 >60 mL/min   Anion gap 10 5 - 15      Assessment & Plan:   Problem List Items Addressed This Visit   None    Follow up plan: No follow-ups on file.

## 2022-08-19 NOTE — Patient Instructions (Addendum)
Patient has a schedule appointment with [facility] on April 17th at 1:50 PM. If patient has any concerns regarding his appointment. Patient may reach out to their office directly at [phone].   Preventative Services:  Health Risk Assessment and Personalized Prevention Plan: Done today Bone Mass Measurements: N/A CVD Screening: Up to date Colon Cancer Screening: up to date Depression Screening:  done today Diabetes Screening: up to date Glaucoma Screening: see your eye doctor Hepatitis B vaccine: N/A Hepatitis C screening: up to date HIV Screening: up to date Flu Vaccine: up to date Lung cancer Screening: N/A Obesity Screening: done today Pneumonia Vaccines (2): up to date STI Screening: N/A PSA screening: up to date

## 2022-08-20 ENCOUNTER — Other Ambulatory Visit: Payer: Self-pay | Admitting: Obstetrics and Gynecology

## 2022-08-21 ENCOUNTER — Other Ambulatory Visit: Payer: Self-pay | Admitting: Family Medicine

## 2022-08-21 ENCOUNTER — Other Ambulatory Visit: Payer: Self-pay | Admitting: Obstetrics and Gynecology

## 2022-08-21 DIAGNOSIS — I429 Cardiomyopathy, unspecified: Secondary | ICD-10-CM

## 2022-08-23 DIAGNOSIS — F17210 Nicotine dependence, cigarettes, uncomplicated: Secondary | ICD-10-CM | POA: Diagnosis not present

## 2022-08-23 DIAGNOSIS — I110 Hypertensive heart disease with heart failure: Secondary | ICD-10-CM | POA: Diagnosis not present

## 2022-08-23 DIAGNOSIS — I4819 Other persistent atrial fibrillation: Secondary | ICD-10-CM | POA: Diagnosis not present

## 2022-08-23 DIAGNOSIS — I716 Thoracoabdominal aortic aneurysm, without rupture, unspecified: Secondary | ICD-10-CM | POA: Diagnosis not present

## 2022-08-23 DIAGNOSIS — I5023 Acute on chronic systolic (congestive) heart failure: Secondary | ICD-10-CM | POA: Diagnosis not present

## 2022-08-23 DIAGNOSIS — J449 Chronic obstructive pulmonary disease, unspecified: Secondary | ICD-10-CM | POA: Diagnosis not present

## 2022-08-23 DIAGNOSIS — E119 Type 2 diabetes mellitus without complications: Secondary | ICD-10-CM | POA: Diagnosis not present

## 2022-08-23 DIAGNOSIS — K2900 Acute gastritis without bleeding: Secondary | ICD-10-CM | POA: Diagnosis not present

## 2022-08-23 DIAGNOSIS — I428 Other cardiomyopathies: Secondary | ICD-10-CM | POA: Diagnosis not present

## 2022-08-23 NOTE — Telephone Encounter (Signed)
Requested Prescriptions  Pending Prescriptions Disp Refills   metFORMIN (GLUCOPHAGE-XR) 500 MG 24 hr tablet [Pharmacy Med Name: METFORMIN HCL ER 500 MG TABLET] 60 tablet 0    Sig: TAKE 1 TABLET BY MOUTH IN THE MORNING AND AT BEDTIME. OFFICE VISIT NEEDED FOR ADDITIONAL REFILLS     Endocrinology:  Diabetes - Biguanides Passed - 08/21/2022  1:28 AM      Passed - Cr in normal range and within 360 days    Creatinine, Ser  Date Value Ref Range Status  07/23/2022 0.89 0.61 - 1.24 mg/dL Final         Passed - HBA1C is between 0 and 7.9 and within 180 days    HB A1C (BAYER DCA - WAIVED)  Date Value Ref Range Status  06/29/2022 6.9 (H) 4.8 - 5.6 % Final    Comment:             Prediabetes: 5.7 - 6.4          Diabetes: >6.4          Glycemic control for adults with diabetes: <7.0          Passed - eGFR in normal range and within 360 days    GFR calc Af Amer  Date Value Ref Range Status  03/28/2020 96 >59 mL/min/1.73 Final    Comment:    **In accordance with recommendations from the NKF-ASN Task force,**   [facility] is in the process of updating its eGFR calculation to the   2021 CKD-EPI creatinine equation that estimates kidney function   without a race variable.    GFR, Estimated  Date Value Ref Range Status  07/23/2022 >60 >60 mL/min Final    Comment:    (NOTE) Calculated using the CKD-EPI Creatinine Equation (2021)    eGFR  Date Value Ref Range Status  06/29/2022 93 >59 mL/min/1.73 Final         Passed - B12 Level in normal range and within 720 days    Vitamin B-12  Date Value Ref Range Status  06/18/2021 304 232 - 1,245 pg/mL Final         Passed - Valid encounter within last 6 months    Recent Outpatient Visits           4 days ago Encounter for annual wellness exam in Medicare patient   [place], [place], DO   1 month ago Acute gastritis, presence of bleeding unspecified, unspecified gastritis type   [place], [place], DO   1 year ago Routine general medical examination at a health care facility   [place], [place], DO   1 year ago [place], [place] P, DO   1 year ago Diabetes mellitus without complication [facility])   [place] P, DO       Future Appointments             In 4 weeks Wynetta Emery, Barb Merino, DO [place], [facility]   In 1 month Mamie Levers, NP [place] at [place] within normal limits and completed in the last 12 months    WBC  Date Value Ref Range Status  06/29/2022 3.7 3.4 - 10.8 x10E3/uL Final  06/26/2022 4.4 4.0 - 10.5 K/uL Final  RBC  Date Value Ref Range Status  06/29/2022 4.86 4.14 - 5.80 x10E6/uL Final  06/26/2022 4.31 4.22 - 5.81 MIL/uL Final   Hemoglobin  Date Value Ref Range Status  06/29/2022 14.1 13.0 - 17.7 g/dL Final   Hematocrit  Date Value Ref Range Status  06/29/2022 41.1 37.5 - 51.0 % Final   MCHC  Date Value Ref Range Status  06/29/2022 34.3 31.5 - 35.7 g/dL Final  06/26/2022 33.1 30.0 - 36.0 g/dL Final   [facility]  Date Value Ref Range Status  06/29/2022 29.0 26.6 - 33.0 pg Final  06/26/2022 28.8 26.0 - 34.0 pg Final   MCV  Date Value Ref Range Status  06/29/2022 85 79 - 97 fL Final   No results found for: "PLTCOUNTKUC", "LABPLAT", "POCPLA" RDW  Date Value Ref Range Status  06/29/2022 14.1 11.6 - 15.4 % Final          furosemide (LASIX) 40 MG tablet [Pharmacy Med Name: FUROSEMIDE 40 MG TABLET] 90 tablet 0    Sig: TAKE 1 TABLET BY MOUTH EVERY DAY     Cardiovascular:  Diuretics - Loop Failed - 08/21/2022  1:28 AM      Failed - K in normal range and within 180 days    Potassium  Date Value Ref Range Status  07/23/2022 3.0 (L) 3.5 - 5.1 mmol/L Final         Failed - Ca in normal range and within 180 days    Calcium  Date Value Ref  Range Status  07/23/2022 8.5 (L) 8.9 - 10.3 mg/dL Final         Passed - Na in normal range and within 180 days    Sodium  Date Value Ref Range Status  07/23/2022 137 135 - 145 mmol/L Final  06/29/2022 134 134 - 144 mmol/L Final         Passed - Cr in normal range and within 180 days    Creatinine, Ser  Date Value Ref Range Status  07/23/2022 0.89 0.61 - 1.24 mg/dL Final         Passed - Cl in normal range and within 180 days    Chloride  Date Value Ref Range Status  07/23/2022 102 98 - 111 mmol/L Final         Passed - Mg Level in normal range and within 180 days    Magnesium  Date Value Ref Range Status  06/22/2022 2.2 1.7 - 2.4 mg/dL Final    Comment:    Performed at [facility], [address] [place], [place] [address]         Passed - Last BP in normal range    BP Readings from Last 1 Encounters:  08/19/22 137/89         Passed - Valid encounter within last 6 months    Recent Outpatient Visits           4 days ago Encounter for annual wellness exam in Medicare patient   [place], Megan P, DO   1 month ago Acute gastritis, presence of bleeding unspecified, unspecified gastritis type   [place], [place], DO   1 year ago Routine general medical examination at a health care facility   [place], Barb Merino, DO   1 year ago [place], [place] P, DO   1 year ago Diabetes mellitus without complication (  [place])   [place], Barb Merino, DO       Future Appointments             In 4 weeks Wynetta Emery, Barb Merino, DO [place], [facility]   In 1 month Mamie Levers, NP [place] at [facility]             atorvastatin ([place]) 40 MG tablet [Pharmacy Med Name: ATORVASTATIN 40 MG TABLET] 90 tablet 0    Sig: TAKE 1 TABLET BY MOUTH EVERY DAY      Cardiovascular:  Antilipid - Statins Failed - 08/21/2022  1:28 AM      Failed - Lipid Panel in normal range within the last 12 months    Cholesterol, Total  Date Value Ref Range Status  06/29/2022 100 100 - 199 mg/dL Final   LDL Chol Calc (NIH)  Date Value Ref Range Status  06/29/2022 30 0 - 99 mg/dL Final   HDL  Date Value Ref Range Status  06/29/2022 57 >39 mg/dL Final   Triglycerides  Date Value Ref Range Status  06/29/2022 57 0 - 149 mg/dL Final         Passed - Patient is not pregnant      Passed - Valid encounter within last 12 months    Recent Outpatient Visits           4 days ago Encounter for annual wellness exam in Medicare patient   [place], DO   1 month ago Acute gastritis, presence of bleeding unspecified, unspecified gastritis type   [place], [place], DO   1 year ago Routine general medical examination at a health care facility   [place], [place], DO   1 year ago [place], [place] P, DO   1 year ago Diabetes mellitus without complication [facility])   [place], [place], DO       Future Appointments             In 4 weeks Wynetta Emery, Barb Merino, DO [place], [place]   In 1 month Mamie Levers, NP [place] at [facility]

## 2022-08-24 ENCOUNTER — Encounter: Payer: Self-pay | Admitting: Family Medicine

## 2022-08-24 NOTE — Assessment & Plan Note (Signed)
S/P treatment. Call with any concerns.

## 2022-08-24 NOTE — Assessment & Plan Note (Signed)
Following with cardiology. Discussed implantable ICD- he is more open to it. Continue medicine. Continue to follow with cardiology. Call with any concerns.

## 2022-08-25 ENCOUNTER — Encounter: Payer: Medicare HMO | Admitting: Cardiology

## 2022-08-25 DIAGNOSIS — I110 Hypertensive heart disease with heart failure: Secondary | ICD-10-CM | POA: Diagnosis not present

## 2022-08-25 DIAGNOSIS — E119 Type 2 diabetes mellitus without complications: Secondary | ICD-10-CM | POA: Diagnosis not present

## 2022-08-25 DIAGNOSIS — F17210 Nicotine dependence, cigarettes, uncomplicated: Secondary | ICD-10-CM | POA: Diagnosis not present

## 2022-08-25 DIAGNOSIS — I716 Thoracoabdominal aortic aneurysm, without rupture, unspecified: Secondary | ICD-10-CM | POA: Diagnosis not present

## 2022-08-25 DIAGNOSIS — K2900 Acute gastritis without bleeding: Secondary | ICD-10-CM | POA: Diagnosis not present

## 2022-08-25 DIAGNOSIS — I4819 Other persistent atrial fibrillation: Secondary | ICD-10-CM | POA: Diagnosis not present

## 2022-08-25 DIAGNOSIS — I428 Other cardiomyopathies: Secondary | ICD-10-CM | POA: Diagnosis not present

## 2022-08-25 DIAGNOSIS — J449 Chronic obstructive pulmonary disease, unspecified: Secondary | ICD-10-CM | POA: Diagnosis not present

## 2022-08-25 DIAGNOSIS — I5023 Acute on chronic systolic (congestive) heart failure: Secondary | ICD-10-CM | POA: Diagnosis not present

## 2022-08-26 ENCOUNTER — Other Ambulatory Visit: Payer: Self-pay | Admitting: Nurse Practitioner

## 2022-08-26 ENCOUNTER — Encounter: Payer: Self-pay | Admitting: Family Medicine

## 2022-08-26 ENCOUNTER — Other Ambulatory Visit: Payer: Self-pay | Admitting: Obstetrics and Gynecology

## 2022-08-26 DIAGNOSIS — E119 Type 2 diabetes mellitus without complications: Secondary | ICD-10-CM | POA: Diagnosis not present

## 2022-08-26 DIAGNOSIS — I716 Thoracoabdominal aortic aneurysm, without rupture, unspecified: Secondary | ICD-10-CM | POA: Diagnosis not present

## 2022-08-26 DIAGNOSIS — I428 Other cardiomyopathies: Secondary | ICD-10-CM | POA: Diagnosis not present

## 2022-08-26 DIAGNOSIS — I4819 Other persistent atrial fibrillation: Secondary | ICD-10-CM | POA: Diagnosis not present

## 2022-08-26 DIAGNOSIS — I5023 Acute on chronic systolic (congestive) heart failure: Secondary | ICD-10-CM | POA: Diagnosis not present

## 2022-08-26 DIAGNOSIS — J449 Chronic obstructive pulmonary disease, unspecified: Secondary | ICD-10-CM | POA: Diagnosis not present

## 2022-08-26 DIAGNOSIS — K2900 Acute gastritis without bleeding: Secondary | ICD-10-CM | POA: Diagnosis not present

## 2022-08-26 DIAGNOSIS — I110 Hypertensive heart disease with heart failure: Secondary | ICD-10-CM | POA: Diagnosis not present

## 2022-08-26 DIAGNOSIS — F17210 Nicotine dependence, cigarettes, uncomplicated: Secondary | ICD-10-CM | POA: Diagnosis not present

## 2022-08-26 MED ORDER — DIGOXIN 62.5 MCG PO TABS: 0.0625 mg | ORAL_TABLET | Freq: Every day | ORAL | 0 refills | Status: DC

## 2022-08-26 MED ORDER — METOPROLOL SUCCINATE ER 50 MG PO TB24: 50.0000 mg | ORAL_TABLET | Freq: Every day | ORAL | 0 refills | Status: DC

## 2022-08-26 MED ORDER — PANTOPRAZOLE SODIUM 40 MG PO TBEC: 40.0000 mg | DELAYED_RELEASE_TABLET | Freq: Two times a day (BID) | ORAL | 0 refills | Status: DC

## 2022-08-26 MED ORDER — APIXABAN 5 MG PO TABS: 5.0000 mg | ORAL_TABLET | Freq: Two times a day (BID) | ORAL | 0 refills | Status: DC

## 2022-08-27 NOTE — Telephone Encounter (Signed)
Requested medication (s) are due for refill today - no  Requested medication (s) are on the active medication list -yes  Future visit scheduled -yes  Last refill: 08/26/22  Notes to clinic: Pharmacy request: Please review Sig- needs correction  Requested Prescriptions  Pending Prescriptions Disp Refills   Digoxin 62.5 MCG TABS [Pharmacy Med Name: DIGOXIN 62.5 MCG TABLET] 30 tablet 0    Sig: Take 0.0625 mg by mouth daily.     Cardiovascular:  Antiarrhythmic Agents - digoxin Failed - 08/26/2022  5:32 PM      Failed - Digoxin (serum) in normal range and within 360 days    Digoxin Level  Date Value Ref Range Status  07/02/2022 <0.2 (L) 0.8 - 2.0 ng/mL Final    Comment:    Performed at [facility], [address] [place], [place] [address]         Failed - Ca in normal range and within 360 days    Calcium  Date Value Ref Range Status  07/23/2022 8.5 (L) 8.9 - 10.3 mg/dL Final         Failed - K in normal range and within 180 days    Potassium  Date Value Ref Range Status  07/23/2022 3.0 (L) 3.5 - 5.1 mmol/L Final         Passed - Cr in normal range and within 180 days    Creatinine, Ser  Date Value Ref Range Status  07/23/2022 0.89 0.61 - 1.24 mg/dL Final         Passed - Mg Level in normal range and within 360 days    Magnesium  Date Value Ref Range Status  06/22/2022 2.2 1.7 - 2.4 mg/dL Final    Comment:    Performed at [facility], [address], [place], [place] [address]         [place] - Patient had ECG in the last 360 days      Passed - Last Heart Rate in normal range    Pulse Readings from Last 1 Encounters:  08/19/22 82         Passed - Valid encounter within last 6 months    Recent Outpatient Visits           1 week ago Encounter for annual wellness exam in Medicare patient   [place] P, DO   1 month ago Acute gastritis, presence of bleeding unspecified, unspecified gastritis type    [place], [place], DO   1 year ago Routine general medical examination at a health care facility   [place], [place], DO   1 year ago [place], [place] P, DO   1 year ago Diabetes mellitus without complication [facility])   [place], DO       Future Appointments             In 4 days Valerie Roys, DO [place], [facility]   In 3 weeks Wynetta Emery, Barb Merino, DO [place], [facility]   In 1 month Mamie Levers, NP [place] at [facility]  Pending Prescriptions Disp Refills   Digoxin 62.5 MCG TABS [Pharmacy Med Name: DIGOXIN 62.5 MCG TABLET] 30 tablet 0  Sig: Take 0.0625 mg by mouth daily.     Cardiovascular:  Antiarrhythmic Agents - digoxin Failed - 08/26/2022  5:32 PM      Failed - Digoxin (serum) in normal range and within 360 days    Digoxin Level  Date Value Ref Range Status  07/02/2022 <0.2 (L) 0.8 - 2.0 ng/mL Final    Comment:    Performed at [facility], [address] [place], [place] [address]         Failed - Ca in normal range and within 360 days    Calcium  Date Value Ref Range Status  07/23/2022 8.5 (L) 8.9 - 10.3 mg/dL Final         Failed - K in normal range and within 180 days    Potassium  Date Value Ref Range Status  07/23/2022 3.0 (L) 3.5 - 5.1 mmol/L Final         Passed - Cr in normal range and within 180 days    Creatinine, Ser  Date Value Ref Range Status  07/23/2022 0.89 0.61 - 1.24 mg/dL Final         Passed - Mg Level in normal range and within 360 days    Magnesium  Date Value Ref Range Status  06/22/2022 2.2 1.7 - 2.4 mg/dL Final    Comment:    Performed at [facility], [address], [place], [place] [address]         [place] - Patient had ECG in the last 360  days      Passed - Last Heart Rate in normal range    Pulse Readings from Last 1 Encounters:  08/19/22 82         Passed - Valid encounter within last 6 months    Recent Outpatient Visits           1 week ago Encounter for annual wellness exam in Medicare patient   [place] P, DO   1 month ago Acute gastritis, presence of bleeding unspecified, unspecified gastritis type   [place], [place], DO   1 year ago Routine general medical examination at a health care facility   [place], [place], DO   1 year ago [place], [place] P, DO   1 year ago Diabetes mellitus without complication [facility])   [place], [place], DO       Future Appointments             In 4 days Valerie Roys, DO [place], [place]   In 3 weeks Wynetta Emery, Barb Merino, DO [place], [place]   In 1 month Mamie Levers, NP [place] at [facility]

## 2022-08-29 ENCOUNTER — Other Ambulatory Visit: Payer: Self-pay | Admitting: Family Medicine

## 2022-08-30 DIAGNOSIS — I5023 Acute on chronic systolic (congestive) heart failure: Secondary | ICD-10-CM | POA: Diagnosis not present

## 2022-08-30 DIAGNOSIS — E119 Type 2 diabetes mellitus without complications: Secondary | ICD-10-CM | POA: Diagnosis not present

## 2022-08-30 DIAGNOSIS — K2900 Acute gastritis without bleeding: Secondary | ICD-10-CM | POA: Diagnosis not present

## 2022-08-30 DIAGNOSIS — I110 Hypertensive heart disease with heart failure: Secondary | ICD-10-CM | POA: Diagnosis not present

## 2022-08-30 DIAGNOSIS — F17210 Nicotine dependence, cigarettes, uncomplicated: Secondary | ICD-10-CM | POA: Diagnosis not present

## 2022-08-30 DIAGNOSIS — I4819 Other persistent atrial fibrillation: Secondary | ICD-10-CM | POA: Diagnosis not present

## 2022-08-30 DIAGNOSIS — I716 Thoracoabdominal aortic aneurysm, without rupture, unspecified: Secondary | ICD-10-CM | POA: Diagnosis not present

## 2022-08-30 DIAGNOSIS — I428 Other cardiomyopathies: Secondary | ICD-10-CM | POA: Diagnosis not present

## 2022-08-30 DIAGNOSIS — J449 Chronic obstructive pulmonary disease, unspecified: Secondary | ICD-10-CM | POA: Diagnosis not present

## 2022-08-31 ENCOUNTER — Ambulatory Visit (INDEPENDENT_AMBULATORY_CARE_PROVIDER_SITE_OTHER): Payer: Medicare HMO | Admitting: Family Medicine

## 2022-08-31 ENCOUNTER — Encounter: Payer: Self-pay | Admitting: Family Medicine

## 2022-08-31 ENCOUNTER — Telehealth: Payer: Self-pay

## 2022-08-31 VITALS — BP 112/78 | HR 65 | Temp 97.8°F | Ht 72.0 in | Wt 259.8 lb

## 2022-08-31 DIAGNOSIS — I700 Atherosclerosis of aorta: Secondary | ICD-10-CM | POA: Diagnosis not present

## 2022-08-31 DIAGNOSIS — E1159 Type 2 diabetes mellitus with other circulatory complications: Secondary | ICD-10-CM | POA: Diagnosis not present

## 2022-08-31 DIAGNOSIS — J42 Unspecified chronic bronchitis: Secondary | ICD-10-CM

## 2022-08-31 DIAGNOSIS — E785 Hyperlipidemia, unspecified: Secondary | ICD-10-CM | POA: Diagnosis not present

## 2022-08-31 DIAGNOSIS — I5022 Chronic systolic (congestive) heart failure: Secondary | ICD-10-CM

## 2022-08-31 DIAGNOSIS — E0590 Thyrotoxicosis, unspecified without thyrotoxic crisis or storm: Secondary | ICD-10-CM

## 2022-08-31 DIAGNOSIS — I10 Essential (primary) hypertension: Secondary | ICD-10-CM

## 2022-08-31 DIAGNOSIS — I4891 Unspecified atrial fibrillation: Secondary | ICD-10-CM | POA: Diagnosis not present

## 2022-08-31 DIAGNOSIS — M1731 Unilateral post-traumatic osteoarthritis, right knee: Secondary | ICD-10-CM

## 2022-08-31 NOTE — Telephone Encounter (Signed)
Requested medication (s) are due for refill today:   Yes  Requested medication (s) are on the active medication list:   Yes  Future visit scheduled:   Yes today (3/26) with Dr. Wynetta Henderson   Last ordered: 08/26/2022 #30, 0 refills  Returned because wasn't sure if Dr. Wynetta Henderson would be filling this or his cardiologist Dr. Lars Mage.   Requested Prescriptions  Pending Prescriptions Disp Refills   digoxin (LANOXIN) 0.125 MG tablet [Pharmacy Med Name: DIGOXIN 125 MCG TABLET] 90 tablet 1    Sig: TAKE 1 TABLET BY MOUTH EVERY DAY     Cardiovascular:  Antiarrhythmic Agents - digoxin Failed - 08/29/2022  3:00 PM      Failed - Digoxin (serum) in normal range and within 360 days    Digoxin Level  Date Value Ref Range Status  07/02/2022 <0.2 (L) 0.8 - 2.0 ng/mL Final    Comment:    Performed at [facility], [address] [place], [place] [address]         Failed - Ca in normal range and within 360 days    Calcium  Date Value Ref Range Status  07/23/2022 8.5 (L) 8.9 - 10.3 mg/dL Final         Failed - K in normal range and within 180 days    Potassium  Date Value Ref Range Status  07/23/2022 3.0 (L) 3.5 - 5.1 mmol/L Final         Passed - Cr in normal range and within 180 days    Creatinine, Ser  Date Value Ref Range Status  07/23/2022 0.89 0.61 - 1.24 mg/dL Final         Passed - Mg Level in normal range and within 360 days    Magnesium  Date Value Ref Range Status  06/22/2022 2.2 1.7 - 2.4 mg/dL Final    Comment:    Performed at [facility], [address], [place], [place] [address]         [place] - Patient had ECG in the last 360 days      Passed - Last Heart Rate in normal range    Pulse Readings from Last 1 Encounters:  08/19/22 82         Passed - Valid encounter within last 6 months    Recent Outpatient Visits           1 week ago Encounter for annual wellness exam in Medicare patient   [place] P, DO   2 months ago Acute gastritis, presence of bleeding unspecified, unspecified gastritis type   [place], [place], DO   1 year ago Routine general medical examination at a health care facility   [place], [place], DO   1 year ago [place], [place] P, DO   1 year ago Diabetes mellitus without complication [facility])   [place], Barb Merino, DO       Future Appointments             Today Eugene Roys, DO [place], [place]   In 2 weeks Eugene Henderson, Barb Merino, DO [place], [place]   In 1 month Eugene Levers, NP [place] at [facility]

## 2022-08-31 NOTE — Progress Notes (Signed)
BP 112/78   Pulse 65   Temp 97.8 F (36.6 C) (Oral)   Ht 6' (1.829 m)   Wt 259 lb 12.8 oz (117.8 kg)   SpO2 94%   BMI 35.24 kg/m    Subjective:    Patient ID: Eugene Henderson, male    DOB: 12-23-1950, 72 y.o.   MRN: [id number]  HPI: Eugene Henderson is a 72 y.o. male  Chief Complaint  Patient presents with  . Diabetes   He has not gotten his digoxin due to issues with insurance.   Would like a motorized scooter.   Daughter is about to go through surgery. He needs personal health aide before 09/29/22   Continues with pain in his R knee.   Relevant past medical, surgical, family and social history reviewed and updated as indicated. Interim medical history since our last visit reviewed. Allergies and medications reviewed and updated.  Review of Systems  Constitutional: Negative.   Respiratory: Negative.    Cardiovascular: Negative.   Gastrointestinal: Negative.   Musculoskeletal: Negative.   Psychiatric/Behavioral: Negative.      Per HPI unless specifically indicated above     Objective:    BP 112/78   Pulse 65   Temp 97.8 F (36.6 C) (Oral)   Ht 6' (1.829 m)   Wt 259 lb 12.8 oz (117.8 kg)   SpO2 94%   BMI 35.24 kg/m   Wt Readings from Last 3 Encounters:  08/31/22 259 lb 12.8 oz (117.8 kg)  08/18/22 261 lb (118.4 kg)  08/02/22 259 lb (117.5 kg)    Physical Exam Vitals and nursing note reviewed.  Constitutional:      General: He is not in acute distress.    Appearance: Normal appearance. He is obese. He is not ill-appearing, toxic-appearing or diaphoretic.  HENT:     Head: Normocephalic and atraumatic.     Right Ear: External ear normal.     Left Ear: External ear normal.     Nose: Nose normal.     Mouth/Throat:     Mouth: Mucous membranes are moist.     Pharynx: Oropharynx is clear.  Eyes:     General: No scleral icterus.       Right eye: No discharge.        Left eye: No discharge.     Extraocular Movements: Extraocular movements intact.      Conjunctiva/sclera: Conjunctivae normal.     Pupils: Pupils are equal, round, and reactive to light.  Cardiovascular:     Rate and Rhythm: Normal rate and regular rhythm.     Pulses: Normal pulses.     Heart sounds: Normal heart sounds. No murmur heard.    No friction rub. No gallop.  Pulmonary:     Effort: Pulmonary effort is normal. No respiratory distress.     Breath sounds: Normal breath sounds. No stridor. No wheezing, rhonchi or rales.  Chest:     Chest wall: No tenderness.  Musculoskeletal:        General: Swelling (R knee) present. Normal range of motion.     Cervical back: Normal range of motion and neck supple.  Skin:    General: Skin is warm and dry.     Capillary Refill: Capillary refill takes less than 2 seconds.     Coloration: Skin is not jaundiced or pale.     Findings: No bruising, erythema, lesion or rash.  Neurological:     General: No focal deficit present.     Mental  Status: He is alert and oriented to person, place, and time. Mental status is at baseline.  Psychiatric:        Mood and Affect: Mood normal.        Behavior: Behavior normal.        Thought Content: Thought content normal.        Judgment: Judgment normal.    Results for orders placed or performed in visit on 0000000  Basic metabolic panel  Result Value Ref Range   Sodium 137 135 - 145 mmol/L   Potassium 3.0 (L) 3.5 - 5.1 mmol/L   Chloride 102 98 - 111 mmol/L   CO2 25 22 - 32 mmol/L   Glucose, Bld 117 (H) 70 - 99 mg/dL   BUN 13 8 - 23 mg/dL   Creatinine, Ser 0.89 0.61 - 1.24 mg/dL   Calcium 8.5 (L) 8.9 - 10.3 mg/dL   GFR, Estimated >60 >60 mL/min   Anion gap 10 5 - 15      Assessment & Plan:   Problem List Items Addressed This Visit   None    Follow up plan: No follow-ups on file.

## 2022-08-31 NOTE — Telephone Encounter (Signed)
Requested forms have been printed and placed in provider's folder for signature.

## 2022-08-31 NOTE — Telephone Encounter (Signed)
-----   Message from Valerie Roys, DO sent at 08/31/2022  2:33 PM EDT ----- Needs form for personal health aide through [facility]

## 2022-09-01 DIAGNOSIS — J449 Chronic obstructive pulmonary disease, unspecified: Secondary | ICD-10-CM | POA: Diagnosis not present

## 2022-09-01 DIAGNOSIS — I428 Other cardiomyopathies: Secondary | ICD-10-CM | POA: Diagnosis not present

## 2022-09-01 DIAGNOSIS — I5023 Acute on chronic systolic (congestive) heart failure: Secondary | ICD-10-CM | POA: Diagnosis not present

## 2022-09-01 DIAGNOSIS — K2900 Acute gastritis without bleeding: Secondary | ICD-10-CM | POA: Diagnosis not present

## 2022-09-01 DIAGNOSIS — E119 Type 2 diabetes mellitus without complications: Secondary | ICD-10-CM | POA: Diagnosis not present

## 2022-09-01 DIAGNOSIS — I4819 Other persistent atrial fibrillation: Secondary | ICD-10-CM | POA: Diagnosis not present

## 2022-09-01 DIAGNOSIS — I716 Thoracoabdominal aortic aneurysm, without rupture, unspecified: Secondary | ICD-10-CM | POA: Diagnosis not present

## 2022-09-01 DIAGNOSIS — F17210 Nicotine dependence, cigarettes, uncomplicated: Secondary | ICD-10-CM | POA: Diagnosis not present

## 2022-09-01 DIAGNOSIS — I110 Hypertensive heart disease with heart failure: Secondary | ICD-10-CM | POA: Diagnosis not present

## 2022-09-02 ENCOUNTER — Encounter: Payer: Self-pay | Admitting: Family Medicine

## 2022-09-02 NOTE — Assessment & Plan Note (Signed)
Needs a personal aide to help coordinate his medical care while his daughter recovers from surgery. Referral placed today.

## 2022-09-02 NOTE — Assessment & Plan Note (Signed)
Will refer for aquatherapy. Call with any concerns. Continue to follow with ortho.

## 2022-09-03 NOTE — Telephone Encounter (Signed)
Requested paperwork has been completed and faxed back to [facility].

## 2022-09-06 DIAGNOSIS — F17210 Nicotine dependence, cigarettes, uncomplicated: Secondary | ICD-10-CM | POA: Diagnosis not present

## 2022-09-06 DIAGNOSIS — K2900 Acute gastritis without bleeding: Secondary | ICD-10-CM | POA: Diagnosis not present

## 2022-09-06 DIAGNOSIS — I5023 Acute on chronic systolic (congestive) heart failure: Secondary | ICD-10-CM | POA: Diagnosis not present

## 2022-09-06 DIAGNOSIS — I110 Hypertensive heart disease with heart failure: Secondary | ICD-10-CM | POA: Diagnosis not present

## 2022-09-06 DIAGNOSIS — J449 Chronic obstructive pulmonary disease, unspecified: Secondary | ICD-10-CM | POA: Diagnosis not present

## 2022-09-06 DIAGNOSIS — I428 Other cardiomyopathies: Secondary | ICD-10-CM | POA: Diagnosis not present

## 2022-09-06 DIAGNOSIS — I4819 Other persistent atrial fibrillation: Secondary | ICD-10-CM | POA: Diagnosis not present

## 2022-09-06 DIAGNOSIS — I716 Thoracoabdominal aortic aneurysm, without rupture, unspecified: Secondary | ICD-10-CM | POA: Diagnosis not present

## 2022-09-06 DIAGNOSIS — E119 Type 2 diabetes mellitus without complications: Secondary | ICD-10-CM | POA: Diagnosis not present

## 2022-09-07 DIAGNOSIS — K2900 Acute gastritis without bleeding: Secondary | ICD-10-CM | POA: Diagnosis not present

## 2022-09-07 DIAGNOSIS — E119 Type 2 diabetes mellitus without complications: Secondary | ICD-10-CM | POA: Diagnosis not present

## 2022-09-07 DIAGNOSIS — I5023 Acute on chronic systolic (congestive) heart failure: Secondary | ICD-10-CM | POA: Diagnosis not present

## 2022-09-07 DIAGNOSIS — H44521 Atrophy of globe, right eye: Secondary | ICD-10-CM | POA: Diagnosis not present

## 2022-09-07 DIAGNOSIS — I716 Thoracoabdominal aortic aneurysm, without rupture, unspecified: Secondary | ICD-10-CM | POA: Diagnosis not present

## 2022-09-07 DIAGNOSIS — I4819 Other persistent atrial fibrillation: Secondary | ICD-10-CM | POA: Diagnosis not present

## 2022-09-07 DIAGNOSIS — J449 Chronic obstructive pulmonary disease, unspecified: Secondary | ICD-10-CM | POA: Diagnosis not present

## 2022-09-07 DIAGNOSIS — I428 Other cardiomyopathies: Secondary | ICD-10-CM | POA: Diagnosis not present

## 2022-09-07 DIAGNOSIS — I110 Hypertensive heart disease with heart failure: Secondary | ICD-10-CM | POA: Diagnosis not present

## 2022-09-07 DIAGNOSIS — F17210 Nicotine dependence, cigarettes, uncomplicated: Secondary | ICD-10-CM | POA: Diagnosis not present

## 2022-09-08 DIAGNOSIS — I716 Thoracoabdominal aortic aneurysm, without rupture, unspecified: Secondary | ICD-10-CM | POA: Diagnosis not present

## 2022-09-08 DIAGNOSIS — I110 Hypertensive heart disease with heart failure: Secondary | ICD-10-CM | POA: Diagnosis not present

## 2022-09-08 DIAGNOSIS — I5023 Acute on chronic systolic (congestive) heart failure: Secondary | ICD-10-CM | POA: Diagnosis not present

## 2022-09-08 DIAGNOSIS — F17210 Nicotine dependence, cigarettes, uncomplicated: Secondary | ICD-10-CM | POA: Diagnosis not present

## 2022-09-08 DIAGNOSIS — E119 Type 2 diabetes mellitus without complications: Secondary | ICD-10-CM | POA: Diagnosis not present

## 2022-09-08 DIAGNOSIS — I4819 Other persistent atrial fibrillation: Secondary | ICD-10-CM | POA: Diagnosis not present

## 2022-09-08 DIAGNOSIS — I428 Other cardiomyopathies: Secondary | ICD-10-CM | POA: Diagnosis not present

## 2022-09-08 DIAGNOSIS — J449 Chronic obstructive pulmonary disease, unspecified: Secondary | ICD-10-CM | POA: Diagnosis not present

## 2022-09-08 DIAGNOSIS — K2900 Acute gastritis without bleeding: Secondary | ICD-10-CM | POA: Diagnosis not present

## 2022-09-09 ENCOUNTER — Other Ambulatory Visit: Payer: Self-pay | Admitting: Family Medicine

## 2022-09-09 DIAGNOSIS — I429 Cardiomyopathy, unspecified: Secondary | ICD-10-CM

## 2022-09-09 NOTE — Telephone Encounter (Signed)
Requested medication (s) are due for refill today: yes  Requested medication (s) are on the active medication list: yes  Last refill:  07/02/22  Future visit scheduled: yes  Notes to clinic:  Unable to refill per protocol, last refill by another provider.  Historical provider, routing for review.     Requested Prescriptions  Pending Prescriptions Disp Refills   folic acid (FOLVITE) 1 MG tablet [Pharmacy Med Name: FOLIC ACID 1 MG TABLET] 90 tablet     Sig: TAKE 1 TABLET BY MOUTH EVERY DAY     Endocrinology:  Vitamins Passed - 09/09/2022  2:30 AM      Passed - Valid encounter within last 12 months    Recent Outpatient Visits           1 week ago Post-traumatic osteoarthritis of right knee   [place], [place], DO   3 weeks ago Encounter for annual wellness exam in Medicare patient   [place], [place], DO   2 months ago Acute gastritis, presence of bleeding unspecified, unspecified gastritis type   [place], [place], DO   1 year ago Routine general medical examination at a health care facility   [place], [place], DO   1 year ago [place], Barb Merino, DO       Future Appointments             In 3 weeks Wynetta Emery, Barb Merino, DO [place], [place]   In 1 month Mamie Levers, NP [place] at [facility]

## 2022-09-14 ENCOUNTER — Telehealth: Payer: Medicare HMO

## 2022-09-14 ENCOUNTER — Telehealth: Payer: Self-pay

## 2022-09-14 NOTE — Telephone Encounter (Signed)
   CCM RN Visit Note   09-14-2022 Name: Eugene Henderson MRN: [id number]      DOB: [birth year]  Subjective: Eugene Henderson is a 72 y.o. year old male who is a primary care patient of Dr. Laural Benes. The patient was referred to the Chronic Care Management team for assistance with care management needs subsequent to provider initiation of CCM services and plan of care.      An unsuccessful telephone outreach was attempted today to contact the patient about Chronic Care Management needs.    Plan:A HIPAA compliant phone message was left for the patient providing contact information and requesting a return call.  Alto Denver RN, MSN, CCM RN Care Manager  Chronic Care Management Direct Number: [phone]

## 2022-09-15 ENCOUNTER — Encounter: Payer: Self-pay | Admitting: *Deleted

## 2022-09-15 ENCOUNTER — Ambulatory Visit (HOSPITAL_BASED_OUTPATIENT_CLINIC_OR_DEPARTMENT_OTHER): Payer: Medicare HMO | Admitting: Cardiology

## 2022-09-15 ENCOUNTER — Encounter: Payer: Self-pay | Admitting: Cardiology

## 2022-09-15 ENCOUNTER — Other Ambulatory Visit
Admission: RE | Admit: 2022-09-15 | Discharge: 2022-09-15 | Disposition: A | Discharge status: Home / Self Care | Payer: Medicare HMO | Source: Ambulatory Visit | Attending: Cardiology | Admitting: Cardiology

## 2022-09-15 VITALS — BP 78/58 | HR 73 | Wt 260.1 lb

## 2022-09-15 DIAGNOSIS — I4819 Other persistent atrial fibrillation: Secondary | ICD-10-CM | POA: Diagnosis not present

## 2022-09-15 DIAGNOSIS — I4891 Unspecified atrial fibrillation: Secondary | ICD-10-CM | POA: Diagnosis not present

## 2022-09-15 DIAGNOSIS — I429 Cardiomyopathy, unspecified: Secondary | ICD-10-CM | POA: Diagnosis not present

## 2022-09-15 DIAGNOSIS — I5022 Chronic systolic (congestive) heart failure: Secondary | ICD-10-CM | POA: Insufficient documentation

## 2022-09-15 LAB — COMPREHENSIVE METABOLIC PANEL
ALT: 17 U/L (ref 0–44)
AST: 20 U/L (ref 15–41)
Albumin: 4.3 g/dL (ref 3.5–5.0)
Alkaline Phosphatase: 105 U/L (ref 38–126)
Anion gap: 8 (ref 5–15)
BUN: 16 mg/dL (ref 8–23)
CO2: 28 mmol/L (ref 22–32)
Calcium: 9 mg/dL (ref 8.9–10.3)
Chloride: 100 mmol/L (ref 98–111)
Creatinine, Ser: 1.03 mg/dL (ref 0.61–1.24)
GFR, Estimated: >60 mL/min (ref >60)
Glucose, Bld: 117 mg/dL — ABNORMAL HIGH (ref 70–99)
Potassium: 4.5 mmol/L (ref 3.5–5.1)
Sodium: 136 mmol/L (ref 135–145)
Total Bilirubin: 1.2 mg/dL (ref 0.3–1.2)
Total Protein: 7.8 g/dL (ref 6.5–8.1)

## 2022-09-15 LAB — CBC
HCT: 44.6 % (ref 39.0–52.0)
Hemoglobin: 14.7 g/dL (ref 13.0–17.0)
MCH: 28.9 pg (ref 26.0–34.0)
MCHC: 33 g/dL (ref 30.0–36.0)
MCV: 87.8 fL (ref 80.0–100.0)
Platelets: 177 10*3/uL (ref 150–400)
RBC: 5.08 MIL/uL (ref 4.22–5.81)
RDW: 17.4 % — ABNORMAL HIGH (ref 11.5–15.5)
WBC: 3.7 10*3/uL — ABNORMAL LOW (ref 4.0–10.5)
nRBC: 0 % (ref 0.0–0.2)

## 2022-09-15 LAB — TSH: TSH: 3.371 u[IU]/mL (ref 0.350–4.500)

## 2022-09-15 LAB — DIGOXIN LEVEL: Digoxin Level: <0.2 ng/mL — ABNORMAL LOW (ref 0.8–2.0)

## 2022-09-15 LAB — BRAIN NATRIURETIC PEPTIDE: B Natriuretic Peptide: 114.3 pg/mL — ABNORMAL HIGH (ref 0.0–100.0)

## 2022-09-15 MED ORDER — AMIODARONE HCL 200 MG PO TABS: 200.0000 mg | ORAL_TABLET | Freq: Every day | ORAL | 3 refills | Status: DC

## 2022-09-15 NOTE — H&P (View-Only) (Signed)
PCP: Dorcas Carrow, DO  Eugene Henderson is a 72 y.o. male with nonischemic cardiomyopathy based on 2018 cath, HTN, T2DM, HFrEF, long standing persistent atrial fibrillation, hx of tobacco use.  He was admitted on 06/14/22 with complains of chest pain and shortness of breath. On admission, patient noted to be in atrial fibrillation w/ RVR.  He was diuresed and underwent DCCV to NSR.  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  Echo in 1/24 showed EF 20-25% with severe RV dysfunction. Cardiac MRI was done in 2/24, showing LV EF 17%, RV EF 36%, small area of lateral wall subendocardial LGE, mid-wall LGE at the inferoseptal RV insertion site (nonspecific). There was a very small area of possible prior MI, but this study suggests primarily nonischemic cardiomyopathy.    Patient returns for followup of CHF.  He saw Dr. Lalla Brothers not long ago and told him that he did not want an ICD.  He has thought more about it and now tells me that he understands the rationale behind the ICD better and wants to get it now.  He wants me to send him back to Dr. Lalla Brothers.  Patient is stably symptomatic with dyspnea after walking 100 feet or after walking across his house.  He has chronic orthopnea and uses 2 pillows.  No chest pain. BP reads low today but he denies lightheadedness. He has quit smoking for about 2 months using nicotine patches. He was in NSR at last appointment, he is in atrial fibrillation today.   ECG: Atrial fibrillation, LPFB, poor RWP  Labs (1/24): K 4.3, creatinine 0.86, LDL 30 Labs (2/24): K 3, creatinine 0.89  PMH: 1. Chronic systolic CHF: Nonischemic cardiomyopathy.   - Echo (2018): EF 25-30% - RHC/LHC (2018): mean RA 12, PA 37/12, mean PCWP 12, CI 1.77; no significant CAD.  - Echo (1/24): EF 20-25%, severely decreased RV systolic function.  - Cardiac MRI (2/24): LV EF 17%,  RV EF 36%, small area of lateral wall subendocardial LGE, mid-wall LGE at the inferoseptal RV insertion site (nonspecific).  2. Ascending aortic aneurysm - 4.7 cm ascending aorta on CTA in 1/24.  3. HTN 4. H/o hyperthyroidism 5. Atrial fibrillation: Persistent.  - 1/24 DCCV to NSR.  6. Type 2 diabetes 7. 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  8. ?COPD: Quit smoking in 2024.   Social History   Socioeconomic History   Marital status: Single    Spouse name: Not on file   Number of children: Not on file   Years of education: Not on file   Highest education level: Not on file  Occupational History   Occupation: retired  Tobacco Use   Smoking status: Some Days    Packs/day: 0.25    Years: 25.00    Additional pack years: 0.00    Total pack years: 6.25    Types: Pipe, Cigarettes   Smokeless tobacco: Never   Tobacco comments:    pipe tobacco   Vaping Use   Vaping Use: Never used  Substance and Sexual Activity   Alcohol use: Yes    Comment: Socially - once a month   Drug use: No   Sexual activity: Not Currently  Other Topics Concern   Not on file  Social History Narrative  Not on file   Social Determinants of Health   Financial Resource Strain: Low Risk  (07/27/2022)   Overall Financial Resource Strain (CARDIA)    Difficulty of Paying Living Expenses: Not very hard  Food Insecurity: No Food Insecurity (07/27/2022)   Hunger Vital Sign    Worried About Running Out of Food in the Last Year: Never true    Ran Out of Food in the Last Year: Never true  Transportation Needs: No Transportation Needs (07/27/2022)   PRAPARE - Administrator, Civil Service (Medical): No    Lack of Transportation (Non-Medical): No  Recent Concern: Transportation Needs - Unmet Transportation Needs (07/12/2022)   PRAPARE - Administrator, Civil Service (Medical):  Yes    Lack of Transportation (Non-Medical): No  Physical Activity: Inactive (07/27/2022)   Exercise Vital Sign    Days of Exercise per Week: 0 days    Minutes of Exercise per Session: 0 min  Stress: No Stress Concern Present (07/27/2022)   [employer] of Occupational Health - Occupational Stress Questionnaire    Feeling of Stress : Not at all  Social Connections: Socially Isolated (07/27/2022)   Social Connection and Isolation Panel [NHANES]    Frequency of Communication with Friends and Family: More than three times a week    Frequency of Social Gatherings with Friends and Family: More than three times a week    Attends Religious Services: Never    Database administrator or Organizations: No    Attends Banker Meetings: Never    Marital Status: Divorced  Catering manager Violence: Not At Risk (07/27/2022)   Humiliation, Afraid, Rape, and Kick questionnaire    Fear of Current or Ex-Partner: No    Emotionally Abused: No    Physically Abused: No    Sexually Abused: No   Family History  Problem Relation Age of Onset   Diabetes Mother    Diabetes Father    Heart disease Brother    Heart attack Brother    Diabetes Brother    Kidney failure Brother    Thyroid disease Neg Hx    ROS: All systems reviewed and negative except as per HPI.   Current Outpatient Medications  Medication Sig Dispense Refill   acetaminophen (TYLENOL) 500 MG tablet Take 500-1,000 mg by mouth every 6 (six) hours as needed for mild pain, fever or headache.      albuterol (VENTOLIN HFA) 108 (90 Base) MCG/ACT inhaler TAKE 2 PUFFS BY MOUTH EVERY 6 HOURS AS NEEDED FOR WHEEZE OR SHORTNESS OF BREATH 6.7 each 3   apixaban (ELIQUIS) 5 MG TABS tablet Take 1 tablet (5 mg total) by mouth 2 (two) times daily. 60 tablet 0   atorvastatin (LIPITOR) 40 MG tablet TAKE 1 TABLET BY MOUTH EVERY DAY 90 tablet 0   budesonide-formoterol (SYMBICORT) 160-4.5 MCG/ACT inhaler TAKE 2 PUFFS BY MOUTH TWICE A DAY 30.6 each 1    Digoxin 62.5 MCG TABS Take 0.0625 mg by mouth daily. 30 tablet 0   folic acid (FOLVITE) 1 MG tablet TAKE 1 TABLET BY MOUTH EVERY DAY 90 tablet 2   furosemide (LASIX) 40 MG tablet TAKE 1 TABLET BY MOUTH EVERY DAY 90 tablet 0   hydrocortisone (ANUSOL-HC) 25 MG suppository One suppository twice a day as needed for hemmorhoids, (okay to substitute generic) 24 suppository 1   JARDIANCE 10 MG TABS tablet TAKE 1 TABLET BY MOUTH DAILY BEFORE BREAKFAST. 30 tablet 5   metFORMIN (GLUCOPHAGE) 500 MG  tablet Take 1 tablet (500 mg total) by mouth 2 (two) times daily with a meal. 180 tablet 1   metFORMIN (GLUCOPHAGE) 500 MG tablet Take 500 mg by mouth 2 (two) times daily with a meal.     metoprolol succinate (TOPROL-XL) 50 MG 24 hr tablet Take 1 tablet (50 mg total) by mouth daily. Take with or immediately following a meal. 30 tablet 0   nicotine (NICODERM CQ) 7 mg/24hr patch Place 1 patch (7 mg total) onto the skin daily. 28 patch 3   pantoprazole (PROTONIX) 40 MG tablet Take 1 tablet (40 mg total) by mouth 2 (two) times daily. 60 tablet 0   potassium chloride SA (KLOR-CON M) 20 MEQ tablet Take 2 tablets (40 mEq total) by mouth daily. 90 tablet 3   sacubitril-valsartan (ENTRESTO) 49-51 MG Take 1 tablet by mouth 2 (two) times daily. 60 tablet 3   spironolactone (ALDACTONE) 25 MG tablet TAKE 1 TABLET (25 MG TOTAL) BY MOUTH DAILY. 90 tablet 0   thiamine 100 MG tablet Take 1 tablet (100 mg total) by mouth daily. 90 tablet 1   amiodarone (PACERONE) 200 MG tablet Take 1 tablet (200 mg total) by mouth daily. Take 400mg  daily for 1 week then decrease to 200mg  daily. 30 tablet 3   No current facility-administered medications for this visit.   BP (!) 78/58   Pulse 73   Wt 260 lb 2 oz (118 kg)   SpO2 98%   BMI 35.28 kg/m  General: NAD Neck: No JVD, no thyromegaly or thyroid nodule.  Lungs: Distant BS.  CV: Nondisplaced PMI.  Heart irregular S1/S2, no S3/S4, no murmur.  No peripheral edema.  No carotid bruit.   Normal pedal pulses.  Abdomen: Soft, nontender, no hepatosplenomegaly, no distention.  Skin: Intact without lesions or rashes.  Neurologic: Alert and oriented x 3.  Psych: Normal affect. Extremities: No clubbing or cyanosis.  HEENT: Normal.   Assessment/Plan: 1. Chronic systolic CHF: Nonischemic cardiomyopathy, diagnosed 2018.  Cath in 2018 with low CI at 1.77 and no significant CAD.  Most recent echo 1/24 with EF 20-25%, severe RV dysfunction.  Cardiac MRI in 2/24 showed LV EF 17%, RV EF 36%, small area of lateral wall subendocardial LGE, mid-wall LGE at the inferoseptal RV insertion site (nonspecific). There was a very small area of possible prior MI, but this study suggests primarily nonischemic cardiomyopathy.  Still quite symptomatic, NYHA class III.  Not volume overloaded on exam.  As noted before, I suspect some of his symptoms may be from COPD.  He does not have BP room to adjust GDMT today.   - Continue Entresto 49/51 bid, BMET/BNP today.  - Continue spironolactone 25 mg daily.  - Continue digoxin 0.0625 daily, check level today.  - Continue Jardiance 10 mg daily.  - Continue Lasix 40 mg daily.  - I will arrange for RHC to assess filling pressures and cardiac output.  If low output, will need to begin thinking about LVAD. We discussed risks/benefits and he agrees to procedure. I will not hold apixaban for procedure.  - He will also need a CPX.  - With long-standing cardiomyopathy, he qualifies for ICD. Narrow QRS so not CRT candidate. He saw Dr. Lalla BrothersLambert recently and said he did not want ICD. He has thought it over and he wants to go back to see Dr. Lalla BrothersLambert to get set up for ICD.  I will refer him back.  2. Atrial fibrillation: Persistent, s/p DCCV to NSR in 1/24.  Suspect AF plays a role in his symptoms but given long-standing cardiomyopathy, suspect his cardiomyopathy is not solely tachycardia-mediated.  He is back in AF today (was in NSR last visit), rate is controlled.  He saw Dr.  Lalla BrothersLambert who wants to see that he can maintain NSR prior to ablating.  - Continue apixaban 5 mg bid. Can stop aspirin given apixaban use.  - Continue amiodarone 200 mg daily. Check LFTs, TSH today.  He will need regular eye exam.  - I will arrange for DCCV next week. If he can hold NSR for an appreciable period of time on amiodarone, he may be an AF ablation candidate. We discussed risks/benefits and he agrees to procedure.  3. Suspect COPD: Patient has been a long-standing smoker and has dyspnea out of proportion to volume overload on exam.  I suspect significant COPD. He has now quit smoking.  - Still waiting for full PFTs.  - Needs to quit smoking, plans to use nicotine patches.  4. AAA/TAA: Follows with vascular.   Followup 1 month.   Eugene Henderson 09/15/2022   Eugene Henderson 09/15/22

## 2022-09-15 NOTE — Patient Instructions (Signed)
STOP Aspirin  DECREASE Amiodarone to 200mg  daily  Routine lab work today. Will notify you of abnormal results  Your provider requests you have pulmonary functions tests please see instruction sheet  Your provider requests you have a right heart cath and cardioversion (See instruction sheet)  Follow up with Dr.Lambert  Follow up in 1 month  Do the following things EVERYDAY: Weigh yourself in the morning before breakfast. Write it down and keep it in a log. Take your medicines as prescribed Eat low salt foods--Limit salt (sodium) to 2000 mg per day.  Stay as active as you can everyday Limit all fluids for the day to less than 2 liters

## 2022-09-15 NOTE — H&P (View-Only) (Signed)
PCP: Colgan, Megan P, DO  Eugene Henderson is a 72 y.o. male with nonischemic cardiomyopathy based on 2018 cath, HTN, T2DM, HFrEF, long standing persistent atrial fibrillation, hx of tobacco use.  He was admitted on 06/14/22 with complains of chest pain and shortness of breath. On admission, patient noted to be in atrial fibrillation w/ RVR.  He was diuresed and underwent DCCV to NSR.  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  Echo in 1/24 showed EF 20-25% with severe RV dysfunction. Cardiac MRI was done in 2/24, showing LV EF 17%, RV EF 36%, small area of lateral wall subendocardial LGE, mid-wall LGE at the inferoseptal RV insertion site (nonspecific). There was a very small area of possible prior MI, but this study suggests primarily nonischemic cardiomyopathy.    Patient returns for followup of CHF.  He saw Dr. Lambert not long ago and told him that he did not want an ICD.  He has thought more about it and now tells me that he understands the rationale behind the ICD better and wants to get it now.  He wants me to send him back to Dr. Lambert.  Patient is stably symptomatic with dyspnea after walking 100 feet or after walking across his house.  He has chronic orthopnea and uses 2 pillows.  No chest pain. BP reads low today but he denies lightheadedness. He has quit smoking for about 2 months using nicotine patches. He was in NSR at last appointment, he is in atrial fibrillation today.   ECG: Atrial fibrillation, LPFB, poor RWP  Labs (1/24): K 4.3, creatinine 0.86, LDL 30 Labs (2/24): K 3, creatinine 0.89  PMH: 1. Chronic systolic CHF: Nonischemic cardiomyopathy.   - Echo (2018): EF 25-30% - RHC/LHC (2018): mean RA 12, PA 37/12, mean PCWP 12, CI 1.77; no significant CAD.  - Echo (1/24): EF 20-25%, severely decreased RV systolic function.  - Cardiac MRI (2/24): LV EF 17%,  RV EF 36%, small area of lateral wall subendocardial LGE, mid-wall LGE at the inferoseptal RV insertion site (nonspecific).  2. Ascending aortic aneurysm - 4.7 cm ascending aorta on CTA in 1/24.  3. HTN 4. H/o hyperthyroidism 5. Atrial fibrillation: Persistent.  - 1/24 DCCV to NSR.  6. Type 2 diabetes 7. 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  8. ?COPD: Quit smoking in 2024.   Social History   Socioeconomic History   Marital status: Single    Spouse name: Not on file   Number of children: Not on file   Years of education: Not on file   Highest education level: Not on file  Occupational History   Occupation: retired  Tobacco Use   Smoking status: Some Days    Packs/day: 0.25    Years: 25.00    Additional pack years: 0.00    Total pack years: 6.25    Types: Pipe, Cigarettes   Smokeless tobacco: Never   Tobacco comments:    pipe tobacco   Vaping Use   Vaping Use: Never used  Substance and Sexual Activity   Alcohol use: Yes    Comment: Socially - once a month   Drug use: No   Sexual activity: Not Currently  Other Topics Concern   Not on file  Social History Narrative     Not on file   Social Determinants of Health   Financial Resource Strain: Low Risk  (07/27/2022)   Overall Financial Resource Strain (CARDIA)    Difficulty of Paying Living Expenses: Not very hard  Food Insecurity: No Food Insecurity (07/27/2022)   Hunger Vital Sign    Worried About Running Out of Food in the Last Year: Never true    Ran Out of Food in the Last Year: Never true  Transportation Needs: No Transportation Needs (07/27/2022)   PRAPARE - Transportation    Lack of Transportation (Medical): No    Lack of Transportation (Non-Medical): No  Recent Concern: Transportation Needs - Unmet Transportation Needs (07/12/2022)   PRAPARE - Transportation    Lack of Transportation (Medical):  Yes    Lack of Transportation (Non-Medical): No  Physical Activity: Inactive (07/27/2022)   Exercise Vital Sign    Days of Exercise per Week: 0 days    Minutes of Exercise per Session: 0 min  Stress: No Stress Concern Present (07/27/2022)   [employer] - Occupational Stress Questionnaire    Feeling of Stress : Not at all  Social Connections: Socially Isolated (07/27/2022)   Social Connection and Isolation Panel [NHANES]    Frequency of Communication with Friends and Family: More than three times a week    Frequency of Social Gatherings with Friends and Family: More than three times a week    Attends Religious Services: Never    Active Member of Clubs or Organizations: No    Attends Club or Organization Meetings: Never    Marital Status: Divorced  Intimate Partner Violence: Not At Risk (07/27/2022)   Humiliation, Afraid, Rape, and Kick questionnaire    Fear of Current or Ex-Partner: No    Emotionally Abused: No    Physically Abused: No    Sexually Abused: No   Family History  Problem Relation Age of Onset   Diabetes Mother    Diabetes Father    Heart disease Brother    Heart attack Brother    Diabetes Brother    Kidney failure Brother    Thyroid disease Neg Hx    ROS: All systems reviewed and negative except as per HPI.   Current Outpatient Medications  Medication Sig Dispense Refill   acetaminophen (TYLENOL) 500 MG tablet Take 500-1,000 mg by mouth every 6 (six) hours as needed for mild pain, fever or headache.      albuterol (VENTOLIN HFA) 108 (90 Base) MCG/ACT inhaler TAKE 2 PUFFS BY MOUTH EVERY 6 HOURS AS NEEDED FOR WHEEZE OR SHORTNESS OF BREATH 6.7 each 3   apixaban (ELIQUIS) 5 MG TABS tablet Take 1 tablet (5 mg total) by mouth 2 (two) times daily. 60 tablet 0   atorvastatin (LIPITOR) 40 MG tablet TAKE 1 TABLET BY MOUTH EVERY DAY 90 tablet 0   budesonide-formoterol (SYMBICORT) 160-4.5 MCG/ACT inhaler TAKE 2 PUFFS BY MOUTH TWICE A DAY 30.6 each 1    Digoxin 62.5 MCG TABS Take 0.0625 mg by mouth daily. 30 tablet 0   folic acid (FOLVITE) 1 MG tablet TAKE 1 TABLET BY MOUTH EVERY DAY 90 tablet 2   furosemide (LASIX) 40 MG tablet TAKE 1 TABLET BY MOUTH EVERY DAY 90 tablet 0   hydrocortisone (ANUSOL-HC) 25 MG suppository One suppository twice a day as needed for hemmorhoids, (okay to substitute generic) 24 suppository 1   JARDIANCE 10 MG TABS tablet TAKE 1 TABLET BY MOUTH DAILY BEFORE BREAKFAST. 30 tablet 5   metFORMIN (GLUCOPHAGE) 500 MG   tablet Take 1 tablet (500 mg total) by mouth 2 (two) times daily with a meal. 180 tablet 1   metFORMIN (GLUCOPHAGE) 500 MG tablet Take 500 mg by mouth 2 (two) times daily with a meal.     metoprolol succinate (TOPROL-XL) 50 MG 24 hr tablet Take 1 tablet (50 mg total) by mouth daily. Take with or immediately following a meal. 30 tablet 0   nicotine (NICODERM CQ) 7 mg/24hr patch Place 1 patch (7 mg total) onto the skin daily. 28 patch 3   pantoprazole (PROTONIX) 40 MG tablet Take 1 tablet (40 mg total) by mouth 2 (two) times daily. 60 tablet 0   potassium chloride SA (KLOR-CON M) 20 MEQ tablet Take 2 tablets (40 mEq total) by mouth daily. 90 tablet 3   sacubitril-valsartan (ENTRESTO) 49-51 MG Take 1 tablet by mouth 2 (two) times daily. 60 tablet 3   spironolactone (ALDACTONE) 25 MG tablet TAKE 1 TABLET (25 MG TOTAL) BY MOUTH DAILY. 90 tablet 0   thiamine 100 MG tablet Take 1 tablet (100 mg total) by mouth daily. 90 tablet 1   amiodarone (PACERONE) 200 MG tablet Take 1 tablet (200 mg total) by mouth daily. Take 400mg daily for 1 week then decrease to 200mg daily. 30 tablet 3   No current facility-administered medications for this visit.   BP (!) 78/58   Pulse 73   Wt 260 lb 2 oz (118 kg)   SpO2 98%   BMI 35.28 kg/m  General: NAD Neck: No JVD, no thyromegaly or thyroid nodule.  Lungs: Distant BS.  CV: Nondisplaced PMI.  Heart irregular S1/S2, no S3/S4, no murmur.  No peripheral edema.  No carotid bruit.   Normal pedal pulses.  Abdomen: Soft, nontender, no hepatosplenomegaly, no distention.  Skin: Intact without lesions or rashes.  Neurologic: Alert and oriented x 3.  Psych: Normal affect. Extremities: No clubbing or cyanosis.  HEENT: Normal.   Assessment/Plan: 1. Chronic systolic CHF: Nonischemic cardiomyopathy, diagnosed 2018.  Cath in 2018 with low CI at 1.77 and no significant CAD.  Most recent echo 1/24 with EF 20-25%, severe RV dysfunction.  Cardiac MRI in 2/24 showed LV EF 17%, RV EF 36%, small area of lateral wall subendocardial LGE, mid-wall LGE at the inferoseptal RV insertion site (nonspecific). There was a very small area of possible prior MI, but this study suggests primarily nonischemic cardiomyopathy.  Still quite symptomatic, NYHA class III.  Not volume overloaded on exam.  As noted before, I suspect some of his symptoms may be from COPD.  He does not have BP room to adjust GDMT today.   - Continue Entresto 49/51 bid, BMET/BNP today.  - Continue spironolactone 25 mg daily.  - Continue digoxin 0.0625 daily, check level today.  - Continue Jardiance 10 mg daily.  - Continue Lasix 40 mg daily.  - I will arrange for RHC to assess filling pressures and cardiac output.  If low output, will need to begin thinking about LVAD. We discussed risks/benefits and he agrees to procedure. I will not hold apixaban for procedure.  - He will also need a CPX.  - With long-standing cardiomyopathy, he qualifies for ICD. Narrow QRS so not CRT candidate. He saw Dr. Lambert recently and said he did not want ICD. He has thought it over and he wants to go back to see Dr. Lambert to get set up for ICD.  I will refer him back.  2. Atrial fibrillation: Persistent, s/p DCCV to NSR in 1/24.    Suspect AF plays a role in his symptoms but given long-standing cardiomyopathy, suspect his cardiomyopathy is not solely tachycardia-mediated.  He is back in AF today (was in NSR last visit), rate is controlled.  He saw Dr.  Lambert who wants to see that he can maintain NSR prior to ablating.  - Continue apixaban 5 mg bid. Can stop aspirin given apixaban use.  - Continue amiodarone 200 mg daily. Check LFTs, TSH today.  He will need regular eye exam.  - I will arrange for DCCV next week. If he can hold NSR for an appreciable period of time on amiodarone, he may be an AF ablation candidate. We discussed risks/benefits and he agrees to procedure.  3. Suspect COPD: Patient has been a long-standing smoker and has dyspnea out of proportion to volume overload on exam.  I suspect significant COPD. He has now quit smoking.  - Still waiting for full PFTs.  - Needs to quit smoking, plans to use nicotine patches.  4. AAA/TAA: Follows with vascular.   Followup 1 month.   Ilisha Blust 09/15/2022   Deijah Spikes 09/15/22 

## 2022-09-15 NOTE — Progress Notes (Signed)
PCP: Dorcas Carrow, DO  Eugene Henderson is a 72 y.o. male with nonischemic cardiomyopathy based on 2018 cath, HTN, T2DM, HFrEF, long standing persistent atrial fibrillation, hx of tobacco use.  He was admitted on 06/14/22 with complains of chest pain and shortness of breath. On admission, patient noted to be in atrial fibrillation w/ RVR.  He was diuresed and underwent DCCV to NSR.  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  Echo in 1/24 showed EF 20-25% with severe RV dysfunction. Cardiac MRI was done in 2/24, showing LV EF 17%, RV EF 36%, small area of lateral wall subendocardial LGE, mid-wall LGE at the inferoseptal RV insertion site (nonspecific). There was a very small area of possible prior MI, but this study suggests primarily nonischemic cardiomyopathy.    Patient returns for followup of CHF.  He saw Dr. Lalla Brothers not long ago and told him that he did not want an ICD.  He has thought more about it and now tells me that he understands the rationale behind the ICD better and wants to get it now.  He wants me to send him back to Dr. Lalla Brothers.  Patient is stably symptomatic with dyspnea after walking 100 feet or after walking across his house.  He has chronic orthopnea and uses 2 pillows.  No chest pain. BP reads low today but he denies lightheadedness. He has quit smoking for about 2 months using nicotine patches. He was in NSR at last appointment, he is in atrial fibrillation today.   ECG: Atrial fibrillation, LPFB, poor RWP  Labs (1/24): K 4.3, creatinine 0.86, LDL 30 Labs (2/24): K 3, creatinine 0.89  PMH: 1. Chronic systolic CHF: Nonischemic cardiomyopathy.   - Echo (2018): EF 25-30% - RHC/LHC (2018): mean RA 12, PA 37/12, mean PCWP 12, CI 1.77; no significant CAD.  - Echo (1/24): EF 20-25%, severely decreased RV systolic function.  - Cardiac MRI (2/24): LV EF 17%,  RV EF 36%, small area of lateral wall subendocardial LGE, mid-wall LGE at the inferoseptal RV insertion site (nonspecific).  2. Ascending aortic aneurysm - 4.7 cm ascending aorta on CTA in 1/24.  3. HTN 4. H/o hyperthyroidism 5. Atrial fibrillation: Persistent.  - 1/24 DCCV to NSR.  6. Type 2 diabetes 7. 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  8. ?COPD: Quit smoking in 2024.   Social History   Socioeconomic History   Marital status: Single    Spouse name: Not on file   Number of children: Not on file   Years of education: Not on file   Highest education level: Not on file  Occupational History   Occupation: retired  Tobacco Use   Smoking status: Some Days    Packs/day: 0.25    Years: 25.00    Additional pack years: 0.00    Total pack years: 6.25    Types: Pipe, Cigarettes   Smokeless tobacco: Never   Tobacco comments:    pipe tobacco   Vaping Use   Vaping Use: Never used  Substance and Sexual Activity   Alcohol use: Yes    Comment: Socially - once a month   Drug use: No   Sexual activity: Not Currently  Other Topics Concern   Not on file  Social History Narrative  Not on file   Social Determinants of Health   Financial Resource Strain: Low Risk  (07/27/2022)   Overall Financial Resource Strain (CARDIA)    Difficulty of Paying Living Expenses: Not very hard  Food Insecurity: No Food Insecurity (07/27/2022)   Hunger Vital Sign    Worried About Running Out of Food in the Last Year: Never true    Ran Out of Food in the Last Year: Never true  Transportation Needs: No Transportation Needs (07/27/2022)   PRAPARE - Administrator, Civil Service (Medical): No    Lack of Transportation (Non-Medical): No  Recent Concern: Transportation Needs - Unmet Transportation Needs (07/12/2022)   PRAPARE - Administrator, Civil Service (Medical):  Yes    Lack of Transportation (Non-Medical): No  Physical Activity: Inactive (07/27/2022)   Exercise Vital Sign    Days of Exercise per Week: 0 days    Minutes of Exercise per Session: 0 min  Stress: No Stress Concern Present (07/27/2022)   [employer] of Occupational Health - Occupational Stress Questionnaire    Feeling of Stress : Not at all  Social Connections: Socially Isolated (07/27/2022)   Social Connection and Isolation Panel [NHANES]    Frequency of Communication with Friends and Family: More than three times a week    Frequency of Social Gatherings with Friends and Family: More than three times a week    Attends Religious Services: Never    Database administrator or Organizations: No    Attends Banker Meetings: Never    Marital Status: Divorced  Catering manager Violence: Not At Risk (07/27/2022)   Humiliation, Afraid, Rape, and Kick questionnaire    Fear of Current or Ex-Partner: No    Emotionally Abused: No    Physically Abused: No    Sexually Abused: No   Family History  Problem Relation Age of Onset   Diabetes Mother    Diabetes Father    Heart disease Brother    Heart attack Brother    Diabetes Brother    Kidney failure Brother    Thyroid disease Neg Hx    ROS: All systems reviewed and negative except as per HPI.   Current Outpatient Medications  Medication Sig Dispense Refill   acetaminophen (TYLENOL) 500 MG tablet Take 500-1,000 mg by mouth every 6 (six) hours as needed for mild pain, fever or headache.      albuterol (VENTOLIN HFA) 108 (90 Base) MCG/ACT inhaler TAKE 2 PUFFS BY MOUTH EVERY 6 HOURS AS NEEDED FOR WHEEZE OR SHORTNESS OF BREATH 6.7 each 3   apixaban (ELIQUIS) 5 MG TABS tablet Take 1 tablet (5 mg total) by mouth 2 (two) times daily. 60 tablet 0   atorvastatin (LIPITOR) 40 MG tablet TAKE 1 TABLET BY MOUTH EVERY DAY 90 tablet 0   budesonide-formoterol (SYMBICORT) 160-4.5 MCG/ACT inhaler TAKE 2 PUFFS BY MOUTH TWICE A DAY 30.6 each 1    Digoxin 62.5 MCG TABS Take 0.0625 mg by mouth daily. 30 tablet 0   folic acid (FOLVITE) 1 MG tablet TAKE 1 TABLET BY MOUTH EVERY DAY 90 tablet 2   furosemide (LASIX) 40 MG tablet TAKE 1 TABLET BY MOUTH EVERY DAY 90 tablet 0   hydrocortisone (ANUSOL-HC) 25 MG suppository One suppository twice a day as needed for hemmorhoids, (okay to substitute generic) 24 suppository 1   JARDIANCE 10 MG TABS tablet TAKE 1 TABLET BY MOUTH DAILY BEFORE BREAKFAST. 30 tablet 5   metFORMIN (GLUCOPHAGE) 500 MG  tablet Take 1 tablet (500 mg total) by mouth 2 (two) times daily with a meal. 180 tablet 1   metFORMIN (GLUCOPHAGE) 500 MG tablet Take 500 mg by mouth 2 (two) times daily with a meal.     metoprolol succinate (TOPROL-XL) 50 MG 24 hr tablet Take 1 tablet (50 mg total) by mouth daily. Take with or immediately following a meal. 30 tablet 0   nicotine (NICODERM CQ) 7 mg/24hr patch Place 1 patch (7 mg total) onto the skin daily. 28 patch 3   pantoprazole (PROTONIX) 40 MG tablet Take 1 tablet (40 mg total) by mouth 2 (two) times daily. 60 tablet 0   potassium chloride SA (KLOR-CON M) 20 MEQ tablet Take 2 tablets (40 mEq total) by mouth daily. 90 tablet 3   sacubitril-valsartan (ENTRESTO) 49-51 MG Take 1 tablet by mouth 2 (two) times daily. 60 tablet 3   spironolactone (ALDACTONE) 25 MG tablet TAKE 1 TABLET (25 MG TOTAL) BY MOUTH DAILY. 90 tablet 0   thiamine 100 MG tablet Take 1 tablet (100 mg total) by mouth daily. 90 tablet 1   amiodarone (PACERONE) 200 MG tablet Take 1 tablet (200 mg total) by mouth daily. Take  daily for 1 week then decrease to  daily. 30 tablet 3   No current facility-administered medications for this visit.   BP (!) 78/58   Pulse 73   Wt 260 lb 2 oz (118 kg)   SpO2 98%   BMI 35.28 kg/m  General: NAD Neck: No JVD, no thyromegaly or thyroid nodule.  Lungs: Distant BS.  CV: Nondisplaced PMI.  Heart irregular S1/S2, no S3/S4, no murmur.  No peripheral edema.  No carotid bruit.   Normal pedal pulses.  Abdomen: Soft, nontender, no hepatosplenomegaly, no distention.  Skin: Intact without lesions or rashes.  Neurologic: Alert and oriented x 3.  Psych: Normal affect. Extremities: No clubbing or cyanosis.  HEENT: Normal.   Assessment/Plan: 1. Chronic systolic CHF: Nonischemic cardiomyopathy, diagnosed 2018.  Cath in 2018 with low CI at 1.77 and no significant CAD.  Most recent echo 1/24 with EF 20-25%, severe RV dysfunction.  Cardiac MRI in 2/24 showed LV EF 17%, RV EF 36%, small area of lateral wall subendocardial LGE, mid-wall LGE at the inferoseptal RV insertion site (nonspecific). There was a very small area of possible prior MI, but this study suggests primarily nonischemic cardiomyopathy.  Still quite symptomatic, NYHA class III.  Not volume overloaded on exam.  As noted before, I suspect some of his symptoms may be from COPD.  He does not have BP room to adjust GDMT today.   - Continue Entresto 49/51 bid, BMET/BNP today.  - Continue spironolactone 25 mg daily.  - Continue digoxin 0.0625 daily, check level today.  - Continue Jardiance 10 mg daily.  - Continue Lasix 40 mg daily.  - I will arrange for RHC to assess filling pressures and cardiac output.  If low output, will need to begin thinking about LVAD. We discussed risks/benefits and he agrees to procedure. I will not hold apixaban for procedure.  - He will also need a CPX.  - With long-standing cardiomyopathy, he qualifies for ICD. Narrow QRS so not CRT candidate. He saw Dr. Lalla Brothers recently and said he did not want ICD. He has thought it over and he wants to go back to see Dr. Lalla Brothers to get set up for ICD.  I will refer him back.  2. Atrial fibrillation: Persistent, s/p DCCV to NSR in 1/24.  Suspect AF plays a role in his symptoms but given long-standing cardiomyopathy, suspect his cardiomyopathy is not solely tachycardia-mediated.  He is back in AF today (was in NSR last visit), rate is controlled.  He saw Dr.  Lalla BrothersLambert who wants to see that he can maintain NSR prior to ablating.  - Continue apixaban 5 mg bid. Can stop aspirin given apixaban use.  - Continue amiodarone 200 mg daily. Check LFTs, TSH today.  He will need regular eye exam.  - I will arrange for DCCV next week. If he can hold NSR for an appreciable period of time on amiodarone, he may be an AF ablation candidate. We discussed risks/benefits and he agrees to procedure.  3. Suspect COPD: Patient has been a long-standing smoker and has dyspnea out of proportion to volume overload on exam.  I suspect significant COPD. He has now quit smoking.  - Still waiting for full PFTs.  - Needs to quit smoking, plans to use nicotine patches.  4. AAA/TAA: Follows with vascular.   Followup 1 month.   Marca AnconaDalton Satin Boal 09/15/2022   Marca Anconaalton Marteze Vecchio 09/15/22

## 2022-09-16 ENCOUNTER — Other Ambulatory Visit (HOSPITAL_COMMUNITY): Payer: Self-pay

## 2022-09-16 ENCOUNTER — Other Ambulatory Visit: Payer: Self-pay | Admitting: Family Medicine

## 2022-09-16 ENCOUNTER — Telehealth: Payer: Self-pay

## 2022-09-16 MED ORDER — DIGOXIN 125 MCG PO TABS: ORAL_TABLET | ORAL | 3 refills | Status: DC

## 2022-09-16 NOTE — Telephone Encounter (Signed)
Digoxin 0.625 mg tablets aren't covered by pt's insurance but Digoxin 0.125 mg tablet has a $0 co-pay. Prescription for 125 mcg tablets with sig to take 0.5 tablet daily. Sent to [facility] pharmacy. Pt's daughter Ursula Alert) aware and agreeable.

## 2022-09-16 NOTE — Telephone Encounter (Signed)
Rx was sent to pharmacy on 07/04/22 #180/1.   Requested Prescriptions  Refused Prescriptions Disp Refills   metFORMIN (GLUCOPHAGE-XR) 500 MG 24 hr tablet [Pharmacy Med Name: METFORMIN HCL ER 500 MG TABLET] 180 tablet 0    Sig: TAKE 1 TABLET BY MOUTH IN THE MORNING AND AT BEDTIME. OFFICE VISIT NEEDED FOR ADDITIONAL REFILLS     Endocrinology:  Diabetes - Biguanides Passed - 09/16/2022 10:42 AM      Passed - Cr in normal range and within 360 days    Creatinine, Ser  Date Value Ref Range Status  09/15/2022 1.03 0.61 - 1.24 mg/dL Final         Passed - HBA1C is between 0 and 7.9 and within 180 days    HB A1C (BAYER DCA - WAIVED)  Date Value Ref Range Status  06/29/2022 6.9 (H) 4.8 - 5.6 % Final    Comment:             Prediabetes: 5.7 - 6.4          Diabetes: >6.4          Glycemic control for adults with diabetes: <7.0          Passed - eGFR in normal range and within 360 days    GFR calc Af Amer  Date Value Ref Range Status  03/28/2020 96 >59 mL/min/1.73 Final    Comment:    **In accordance with recommendations from the NKF-ASN Task force,**   [facility] is in the process of updating its eGFR calculation to the   2021 CKD-EPI creatinine equation that estimates kidney function   without a race variable.    GFR, Estimated  Date Value Ref Range Status  09/15/2022 >60 >60 mL/min Final    Comment:    (NOTE) Calculated using the CKD-EPI Creatinine Equation (2021)    eGFR  Date Value Ref Range Status  06/29/2022 93 >59 mL/min/1.73 Final         Passed - B12 Level in normal range and within 720 days    Vitamin B-12  Date Value Ref Range Status  06/18/2021 304 232 - 1,245 pg/mL Final         Passed - Valid encounter within last 6 months    Recent Outpatient Visits           2 weeks ago Post-traumatic osteoarthritis of right knee   [place] [facility] [place], Megan P, DO   4 weeks ago Encounter for annual wellness exam in Medicare patient   [place]  [facility] [place], [place] P, DO   2 months ago Acute gastritis, presence of bleeding unspecified, unspecified gastritis type   [place] [facility] [place], Megan P, DO   1 year ago Routine general medical examination at a health care facility   [facility] [place], [place], DO   1 year ago COVID-19   [place] [facility] [place], [place], DO       Future Appointments             In 2 weeks Laural Benes, Oralia Rud, DO [facility], [facility]   In 1 month Sherie Don, NP [place] [facility] - CBC within normal limits and completed in the last 12 months    WBC  Date Value Ref Range Status  09/15/2022 3.7 (L) 4.0 - 10.5 K/uL Final  RBC  Date Value Ref Range Status  09/15/2022 5.08 4.22 - 5.81 MIL/uL Final   Hemoglobin  Date Value Ref Range Status  09/15/2022 14.7 13.0 - 17.0 g/dL Final  99/35/7017 79.3 13.0 - 17.7 g/dL Final   HCT  Date Value Ref Range Status  09/15/2022 44.6 39.0 - 52.0 % Final   Hematocrit  Date Value Ref Range Status  06/29/2022 41.1 37.5 - 51.0 % Final   MCHC  Date Value Ref Range Status  09/15/2022 33.0 30.0 - 36.0 g/dL Final   [facility]  Date Value Ref Range Status  09/15/2022 28.9 26.0 - 34.0 pg Final   MCV  Date Value Ref Range Status  09/15/2022 87.8 80.0 - 100.0 fL Final  06/29/2022 85 79 - 97 fL Final   No results found for: "PLTCOUNTKUC", "LABPLAT", "POCPLA" RDW  Date Value Ref Range Status  09/15/2022 17.4 (H) 11.5 - 15.5 % Final  06/29/2022 14.1 11.6 - 15.4 % Final

## 2022-09-16 NOTE — Progress Notes (Signed)
Spoke with pt's daughter Sherren Mocha regarding digoxin.  Pt's daughter stated that pt is not currently taking Digoxin due to insurance not approving the dose.   I spoke with Kevin Fenton PharmD to see if he could assist with Digoxin approval.  Kevin Fenton is currently working on this and will call pt's Daughter Ursula Alert Best boy) upon finding out med approval.

## 2022-09-20 ENCOUNTER — Ambulatory Visit: Payer: Medicare HMO | Admitting: Family Medicine

## 2022-09-21 ENCOUNTER — Other Ambulatory Visit: Payer: Self-pay

## 2022-09-21 ENCOUNTER — Encounter
Admission: RE | Disposition: A | Discharge status: Home / Self Care | Payer: Self-pay | Source: Home / Self Care | Attending: Cardiology

## 2022-09-21 ENCOUNTER — Telehealth: Payer: Self-pay | Admitting: *Deleted

## 2022-09-21 ENCOUNTER — Other Ambulatory Visit: Payer: Self-pay | Admitting: Cardiology

## 2022-09-21 ENCOUNTER — Encounter: Payer: Self-pay | Admitting: Cardiology

## 2022-09-21 ENCOUNTER — Ambulatory Visit
Admission: RE | Admit: 2022-09-21 | Discharge: 2022-09-21 | Disposition: A | Discharge status: Home / Self Care | Payer: Medicare HMO | Attending: Cardiology | Admitting: Cardiology

## 2022-09-21 DIAGNOSIS — I4819 Other persistent atrial fibrillation: Secondary | ICD-10-CM | POA: Diagnosis not present

## 2022-09-21 DIAGNOSIS — I428 Other cardiomyopathies: Secondary | ICD-10-CM | POA: Diagnosis not present

## 2022-09-21 DIAGNOSIS — Z79899 Other long term (current) drug therapy: Secondary | ICD-10-CM | POA: Diagnosis not present

## 2022-09-21 DIAGNOSIS — I110 Hypertensive heart disease with heart failure: Secondary | ICD-10-CM | POA: Insufficient documentation

## 2022-09-21 DIAGNOSIS — E119 Type 2 diabetes mellitus without complications: Secondary | ICD-10-CM | POA: Insufficient documentation

## 2022-09-21 DIAGNOSIS — Z7901 Long term (current) use of anticoagulants: Secondary | ICD-10-CM | POA: Diagnosis not present

## 2022-09-21 DIAGNOSIS — I712 Thoracic aortic aneurysm, without rupture, unspecified: Secondary | ICD-10-CM | POA: Diagnosis not present

## 2022-09-21 DIAGNOSIS — I509 Heart failure, unspecified: Secondary | ICD-10-CM | POA: Diagnosis not present

## 2022-09-21 DIAGNOSIS — Z7984 Long term (current) use of oral hypoglycemic drugs: Secondary | ICD-10-CM | POA: Insufficient documentation

## 2022-09-21 DIAGNOSIS — I714 Abdominal aortic aneurysm, without rupture, unspecified: Secondary | ICD-10-CM | POA: Insufficient documentation

## 2022-09-21 DIAGNOSIS — I272 Pulmonary hypertension, unspecified: Secondary | ICD-10-CM | POA: Diagnosis not present

## 2022-09-21 DIAGNOSIS — Z87891 Personal history of nicotine dependence: Secondary | ICD-10-CM | POA: Diagnosis not present

## 2022-09-21 DIAGNOSIS — I5022 Chronic systolic (congestive) heart failure: Secondary | ICD-10-CM | POA: Insufficient documentation

## 2022-09-21 HISTORY — PX: RIGHT HEART CATH: CATH118263

## 2022-09-21 LAB — POCT I-STAT EG7
Acid-Base Excess: 1 mmol/L (ref 0.0–2.0)
Bicarbonate: 26.9 mmol/L (ref 20.0–28.0)
Calcium, Ion: 1.19 mmol/L (ref 1.15–1.40)
HCT: 40 % (ref 39.0–52.0)
Hemoglobin: 13.6 g/dL (ref 13.0–17.0)
O2 Saturation: 69 %
Potassium: 4 mmol/L (ref 3.5–5.1)
Sodium: 138 mmol/L (ref 135–145)
TCO2: 28 mmol/L (ref 22–32)
pCO2, Ven: 46.6 mmHg (ref 44–60)
pH, Ven: 7.37 (ref 7.25–7.43)
pO2, Ven: 38 mmHg (ref 32–45)

## 2022-09-21 LAB — GLUCOSE, CAPILLARY: Glucose-Capillary: 96 mg/dL (ref 70–99)

## 2022-09-21 SURGERY — RIGHT HEART CATH
Anesthesia: Moderate Sedation | Laterality: Right

## 2022-09-21 MED ORDER — LABETALOL HCL 5 MG/ML IV SOLN: 10.0000 mg | INTRAVENOUS | Status: DC | PRN

## 2022-09-21 MED ORDER — SODIUM CHLORIDE 0.9 % IV SOLN: INTRAVENOUS | Status: DC

## 2022-09-21 MED ORDER — SODIUM CHLORIDE 0.9% FLUSH: 3.0000 mL | Freq: Two times a day (BID) | INTRAVENOUS | Status: DC

## 2022-09-21 MED ORDER — SODIUM CHLORIDE 0.9 % IV SOLN: 250.0000 mL | INTRAVENOUS | Status: DC | PRN

## 2022-09-21 MED ORDER — HEPARIN (PORCINE) IN NACL 2000-0.9 UNIT/L-% IV SOLN
INTRAVENOUS | Status: DC | PRN
  Administered 2022-09-21: 1000 mL

## 2022-09-21 MED ORDER — HEPARIN (PORCINE) IN NACL 1000-0.9 UT/500ML-% IV SOLN
INTRAVENOUS | Status: AC
  Filled 2022-09-21: qty 500

## 2022-09-21 MED ORDER — ONDANSETRON HCL 4 MG/2ML IJ SOLN: 4.0000 mg | Freq: Four times a day (QID) | INTRAMUSCULAR | Status: DC | PRN

## 2022-09-21 MED ORDER — SODIUM CHLORIDE 0.9% FLUSH: 3.0000 mL | INTRAVENOUS | Status: DC | PRN

## 2022-09-21 MED ORDER — HYDRALAZINE HCL 20 MG/ML IJ SOLN: 10.0000 mg | INTRAMUSCULAR | Status: DC | PRN

## 2022-09-21 MED ORDER — ACETAMINOPHEN 325 MG PO TABS: 650.0000 mg | ORAL_TABLET | ORAL | Status: DC | PRN

## 2022-09-21 SURGICAL SUPPLY — 8 items
CATH BALLN WEDGE 5F 110CM (CATHETERS) IMPLANT
DRAPE BRACHIAL (DRAPES) IMPLANT
GUIDEWIRE .025 260CM (WIRE) IMPLANT
PACK CARDIAC CATH (CUSTOM PROCEDURE TRAY) IMPLANT
PROTECTION STATION PRESSURIZED (MISCELLANEOUS) ×1
SET ATX-X65L (MISCELLANEOUS) IMPLANT
SHEATH GLIDE SLENDER 4/5FR (SHEATH) IMPLANT
STATION PROTECTION PRESSURIZED (MISCELLANEOUS) IMPLANT

## 2022-09-21 NOTE — Discharge Instructions (Signed)
Right Heart Cath, Care After This sheet gives you information about how to care for yourself after your procedure. Your health care provider may also give you more specific instructions. If you have problems or questions, contact your health care provider. What can I expect after the procedure? After the procedure, it is common to have: Bruising or mild discomfort in the area where the IV was inserted (insertion site). Follow these instructions at home: Eating and drinking  You may eat and drink after your procedure.  Drink a lot of fluids for the first several days after the procedure, as directed by your health care provider. This helps to wash (flush) the contrast out of your body. Examples of healthy fluids include water or low-calorie drinks. General instructions Check your IV insertion area and also your venous access site every day for signs of infection. Check for: Redness, swelling, or pain. Fluid or blood. Warmth. Pus or a bad smell. Take over-the-counter and prescription medicines only as told by your health care provider. Rest and return to your normal activities as told by your health care provider. Ask your health care provider what activities are safe for you. Do not drive for 24 hours if you were given a medicine to help you relax (sedative), or until your health care provider approves. Keep all follow-up visits as told by your health care provider. This is important. Contact a health care provider if: Your skin becomes itchy or you develop a rash or hives. You have a fever that does not get better with medicine. You feel nauseous. You vomit. You have redness, swelling, or pain around the insertion site. You have fluid or blood coming from the insertion site. Your insertion area feels warm to the touch. You have pus or a bad smell coming from the insertion site. Get help right away if: You have difficulty breathing or shortness of breath. You develop chest pain. You  faint. You feel very dizzy. These symptoms may represent a serious problem that is an emergency. Do not wait to see if the symptoms will go away. Get medical help right away. Call your local emergency services (911 in the [place] Do not drive yourself to the hospital. Summary After your procedure, it is common to have bruising or mild discomfort in the area where the IV was inserted. You should check your IV insertion area every day for signs of infection. Take over-the-counter and prescription medicines only as told by your health care provider. You should drink a lot of fluids for the first several days after the procedure to help flush the contrast from your body. This information is not intended to replace advice given to you by your health care provider. Make sure you discuss any questions you have with your health care provider. Document Released: 03/14/2013 Document Revised: 05/06/2017 Document Reviewed: 04/17/2016 Elsevier Patient Education  2020 Elsevier Inc. 

## 2022-09-21 NOTE — Interval H&P Note (Signed)
History and Physical Interval Note:  09/21/2022 12:19 PM  Eugene Henderson  has presented today for surgery, with the diagnosis of R Cath    HF.  The various methods of treatment have been discussed with the patient and family. After consideration of risks, benefits and other options for treatment, the patient has consented to  Procedure(s): RIGHT HEART CATH (Right) as a surgical intervention.  The patient's history has been reviewed, patient examined, no change in status, stable for surgery.  I have reviewed the patient's chart and labs.  Questions were answered to the patient's satisfaction.     Aleane Wesenberg [employer]

## 2022-09-22 ENCOUNTER — Telehealth: Payer: Self-pay

## 2022-09-22 ENCOUNTER — Encounter: Payer: Self-pay | Admitting: Cardiology

## 2022-09-22 LAB — POCT I-STAT EG7
Acid-Base Excess: 1 mmol/L (ref 0.0–2.0)
Bicarbonate: 27.2 mmol/L (ref 20.0–28.0)
Calcium, Ion: 1.2 mmol/L (ref 1.15–1.40)
HCT: 40 % (ref 39.0–52.0)
Hemoglobin: 13.6 g/dL (ref 13.0–17.0)
O2 Saturation: 68 %
Potassium: 4.1 mmol/L (ref 3.5–5.1)
Sodium: 137 mmol/L (ref 135–145)
TCO2: 29 mmol/L (ref 22–32)
pCO2, Ven: 46.8 mmHg (ref 44–60)
pH, Ven: 7.373 (ref 7.25–7.43)
pO2, Ven: 37 mmHg (ref 32–45)

## 2022-09-22 NOTE — Progress Notes (Signed)
  Chronic Care Management Note  09/22/2022 Name: Lauren Laduca MRN: [id number] DOB: 1950-07-28  Chikezie Wittmer is a 72 y.o. year old male who is a primary care patient of Marqueze, Pea, DO and is actively engaged with the Chronic Care Management team. I reached out to [name] by phone today to assist with re-scheduling a follow up visit with the RN Case Manager  Follow up plan: Unsuccessful telephone outreach attempt made. A HIPAA compliant phone message was left for the patient providing contact information and requesting a return call.  The care management team will reach out to the patient again over the next 7 days.  If patient returns call to provider office, please advise to call CCM Care Guide Penne Lash  at [phone]  Penne Lash, RMA Care Guide [facility]  [place], [place] [address] Direct Dial: [phone] [email or web address]

## 2022-09-24 ENCOUNTER — Other Ambulatory Visit: Payer: Self-pay | Admitting: Nurse Practitioner

## 2022-09-24 NOTE — Telephone Encounter (Signed)
Requested Prescriptions  Pending Prescriptions Disp Refills   metoprolol succinate (TOPROL-XL) 50 MG 24 hr tablet [Pharmacy Med Name: METOPROLOL SUCC ER 50 MG TAB] 90 tablet 0    Sig: TAKE 1 TABLET BY MOUTH DAILY. TAKE WITH OR IMMEDIATELY FOLLOWING A MEAL.     Cardiovascular:  Beta Blockers Failed - 09/24/2022  9:31 AM      Failed - Last BP in normal range    BP Readings from Last 1 Encounters:  09/21/22 (!) 127/91         Passed - Last Heart Rate in normal range    Pulse Readings from Last 1 Encounters:  09/21/22 77         Passed - Valid encounter within last 6 months    Recent Outpatient Visits           3 weeks ago Post-traumatic osteoarthritis of right knee   [place] [facility] [place], Megan P, DO   1 month ago Encounter for annual wellness exam in Medicare patient   [place] [facility] [place], [place] P, DO   2 months ago Acute gastritis, presence of bleeding unspecified, unspecified gastritis type   [place] [facility] [place], Megan P, DO   1 year ago Routine general medical examination at a health care facility   [facility] [place], Megan P, DO   1 year ago COVID-19   [place] [facility] [place], Oralia Rud, DO       Future Appointments             In 1 week Dorcas Carrow, DO [place] [facility]   In 3 weeks Sherie Don, NP [place] [facility]             pantoprazole (PROTONIX) 40 MG tablet [Pharmacy Med Name: PANTOPRAZOLE SOD DR 40 MG TAB] 180 tablet 0    Sig: TAKE 1 TABLET BY MOUTH TWICE A DAY     Gastroenterology: Proton Pump Inhibitors Passed - 09/24/2022  9:31 AM      Passed - Valid encounter within last 12 months    Recent Outpatient Visits           3 weeks ago Post-traumatic osteoarthritis of right knee   [place] [facility] [place], Megan P, DO   1 month ago Encounter for annual wellness exam  in Medicare patient   [place] [facility] [place], [place] P, DO   2 months ago Acute gastritis, presence of bleeding unspecified, unspecified gastritis type   [place] [facility] [place], Megan P, DO   1 year ago Routine general medical examination at a health care facility   [facility] [place], [place], DO   1 year ago COVID-19   [place] [facility] [place], Oralia Rud, DO       Future Appointments             In 1 week Dorcas Carrow, DO [place] [facility], [facility]   In 3 weeks Sherie Don, NP [facility]

## 2022-09-28 ENCOUNTER — Ambulatory Visit (INDEPENDENT_AMBULATORY_CARE_PROVIDER_SITE_OTHER): Payer: Medicare HMO | Admitting: Student in an Organized Health Care Education/Training Program

## 2022-09-28 ENCOUNTER — Ambulatory Visit: Payer: Medicare HMO | Attending: Cardiology

## 2022-09-28 ENCOUNTER — Encounter: Payer: Self-pay | Admitting: Student in an Organized Health Care Education/Training Program

## 2022-09-28 ENCOUNTER — Telehealth: Payer: Self-pay

## 2022-09-28 VITALS — BP 106/60 | HR 79 | Temp 97.6°F | Ht 73.0 in | Wt 260.6 lb

## 2022-09-28 DIAGNOSIS — J432 Centrilobular emphysema: Secondary | ICD-10-CM | POA: Diagnosis not present

## 2022-09-28 DIAGNOSIS — I429 Cardiomyopathy, unspecified: Secondary | ICD-10-CM

## 2022-09-28 MED ORDER — STIOLTO RESPIMAT 2.5-2.5 MCG/ACT IN AERS
2.0000 | INHALATION_SPRAY | Freq: Every day | RESPIRATORY_TRACT | 12 refills | Status: DC
Start: 2022-09-28 — End: 2022-12-29

## 2022-09-28 NOTE — Telephone Encounter (Signed)
Left message for patient and patient's daughter, Wendi(DPR) to make pt aware that cardioversion is scheduled at [facility] cath lab short stay tomorrow at noon.

## 2022-09-28 NOTE — Progress Notes (Signed)
Synopsis: Referred in for COPD by Laurey Morale, MD  Assessment & Plan:   1. Centrilobular emphysema (COPD GOLD III B)  Presenting for the evaluation of shortness of breath that is multi-factorial with his HFrEF, Pulmonary Hypertension, Afib, and COPD all contributing to symptoms.  He does have emphysema noted on a recent chest CT, and PFT's from February of 2024 are consistent with COPD (FEV1/FVC 0.46, FEV1 35% predicted/z score -5). 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 He has not had any COPD exacerbations nor does he have documented eosinophilia and given this I will shy away from ICS therapy for the time being. I will switch him from LABA/ICS to LABA/LAMA therapy. I will also refer him for lung cancer screening.  Finally, patient is followed closely with cardiology for his HFrEF and atrial fibrillation. He has had a RHC that showed an elevated mean PAP but with PAOP returning at 13. He also has atrial fibrillation for which he's underwent DC cardioversion in January and will undergo again tomorrow prior to him being considered for an ablation procedure. He is maintained on Eliquis for thrombo-prophylaxis.  - Tiotropium Bromide-Olodaterol (STIOLTO RESPIMAT) 2.5-2.5 MCG/ACT AERS; Inhale 2 puffs into the lungs daily.  Dispense: 60 each; Refill: 12 - Lung cancer screening referral sent  Return in about 3 months (around 12/28/2022).  I spent 45 minutes caring for this patient today, including preparing to see the patient, obtaining a medical history , reviewing a separately obtained history, performing a medically appropriate examination and/or evaluation, counseling and educating the patient/family/caregiver, ordering medications, tests, or procedures, documenting clinical information in the electronic health record, and independently interpreting results (not separately reported/billed) and communicating results to the  patient/family/caregiver  Raechel Chute, MD [facility] 09/28/2022 9:39 AM    End of visit medications:  Meds ordered this encounter  Medications   Tiotropium Bromide-Olodaterol (STIOLTO RESPIMAT) 2.5-2.5 MCG/ACT AERS    Sig: Inhale 2 puffs into the lungs daily.    Dispense:  60 each    Refill:  12     Current Outpatient Medications:    acetaminophen (TYLENOL) 500 MG tablet, Take 500-1,000 mg by mouth every 6 (six) hours as needed for mild pain, fever or headache. , Disp: , Rfl:    albuterol (VENTOLIN HFA) 108 (90 Base) MCG/ACT inhaler, TAKE 2 PUFFS BY MOUTH EVERY 6 HOURS AS NEEDED FOR WHEEZE OR SHORTNESS OF BREATH, Disp: 6.7 each, Rfl: 3   amiodarone (PACERONE) 200 MG tablet, Take 1 tablet (200 mg total) by mouth daily. Take 400mg  daily for 1 week then decrease to 200mg  daily., Disp: 30 tablet, Rfl: 3   apixaban (ELIQUIS) 5 MG TABS tablet, Take 1 tablet (5 mg total) by mouth 2 (two) times daily., Disp: 60 tablet, Rfl: 0   atorvastatin (LIPITOR) 40 MG tablet, TAKE 1 TABLET BY MOUTH EVERY DAY, Disp: 90 tablet, Rfl: 0   digoxin (LANOXIN) 0.125 MG tablet, Take 0.5 tablet daily. (0.0625 mg), Disp: 45 tablet, Rfl: 3   folic acid (FOLVITE) 1 MG tablet, TAKE 1 TABLET BY MOUTH EVERY DAY, Disp: 90 tablet, Rfl: 2   furosemide (LASIX) 40 MG tablet, TAKE 1 TABLET BY MOUTH EVERY DAY, Disp: 90 tablet, Rfl: 0   hydrocortisone (ANUSOL-HC) 25 MG suppository, One suppository twice a day as needed for hemmorhoids, (okay to substitute generic), Disp: 24 suppository, Rfl: 1   JARDIANCE 10 MG TABS tablet, TAKE 1 TABLET BY  MOUTH DAILY BEFORE BREAKFAST., Disp: 30 tablet, Rfl: 5   metFORMIN (GLUCOPHAGE) 500 MG tablet, Take 1 tablet (500 mg total) by mouth 2 (two) times daily with a meal., Disp: 180 tablet, Rfl: 1   metoprolol succinate (TOPROL-XL) 50 MG 24 hr tablet, TAKE 1 TABLET BY MOUTH DAILY. TAKE WITH OR IMMEDIATELY FOLLOWING A MEAL., Disp: 90 tablet, Rfl: 0   nicotine (NICODERM CQ) 7  mg/24hr patch, Place 1 patch (7 mg total) onto the skin daily., Disp: 28 patch, Rfl: 3   pantoprazole (PROTONIX) 40 MG tablet, TAKE 1 TABLET BY MOUTH TWICE A DAY, Disp: 180 tablet, Rfl: 0   potassium chloride SA (KLOR-CON M) 20 MEQ tablet, Take 2 tablets (40 mEq total) by mouth daily., Disp: 90 tablet, Rfl: 3   sacubitril-valsartan (ENTRESTO) 49-51 MG, Take 1 tablet by mouth 2 (two) times daily., Disp: 60 tablet, Rfl: 3   spironolactone (ALDACTONE) 25 MG tablet, TAKE 1 TABLET (25 MG TOTAL) BY MOUTH DAILY., Disp: 90 tablet, Rfl: 0   thiamine 100 MG tablet, Take 1 tablet (100 mg total) by mouth daily., Disp: 90 tablet, Rfl: 1   Tiotropium Bromide-Olodaterol (STIOLTO RESPIMAT) 2.5-2.5 MCG/ACT AERS, Inhale 2 puffs into the lungs daily., Disp: 60 each, Rfl: 12   Subjective:   PATIENT ID: Eugene Henderson GENDER: male DOB: 1950-06-25, MRN: [id number]  Chief Complaint  Patient presents with   pulmonary consult    SOB with exertion, dry cough at times prod with white sputum (cough worsens at night) and wheezing.    HPI  The patient is a pleasant 72 year old male with a past medical history of HFrEF, T2DM, Afib, AAA (s/p coil embolization and stent placement) and HTN who presents to clinic for the evaluation of shortness of breath.  Patient has been followed closely in the heart failure clinic by Dr. Shirlee Latch. He has underwent an investigation for his HFrEF and is referred to [place] to assess for COPD given his smoking history as well has his PFT result from February. His shortness of breath was noted to be out of proportion to his cardiac history.  Patient reports shortness of breath that is worse with exertion. He's felt that his dyspnea got worse around 6 months ago. He reports a productive cough that worsened around a month ago. The cough is productive of whitish sputum; he denies hemoptysis. He feels that his chest is junky at times, and this is worse at night. No chest pain or chest tightness is  reported, and he denies any wheezing. He was given an inhaler (Symbicort per dispense report) that he is using twice daily that helps with his symptoms some.  Patient had been followed by cardiology for a long time for HFrEF and was recently established with our heart failure team. He was admitted in January of 2024 to the hospital for chest pain and shortness of breath at which point he was found to be in Afib with RVR. He underwent DC cardioversion during said admission. He was last seen by Dr. Shirlee Latch on 09/15/2022.   He underwent repeat RHC on 09/21/2022 with the following findings: RA 11, RV 38/11, PA 40/16 (29), PCWP 13, CO 5.26, CI 2.22, PVR 3 WU, PAPi 2.2.  Patient reports a long standing history of smoking for around 45 years, with between 30 and 40 pack years of smoking history.  Ancillary information including prior medications, full medical/surgical/family/social histories, and PFTs (when available) are listed below and have been reviewed.   Review of Systems  Constitutional:  Negative for chills, fever, malaise/fatigue and weight loss.  Respiratory:  Positive for cough, sputum production and shortness of breath. Negative for hemoptysis.   Cardiovascular:  Negative for chest pain.     Objective:   Vitals:   09/28/22 0913  BP: 106/60  Pulse: 79  Temp: 97.6 F (36.4 C)  TempSrc: Temporal  SpO2: 98%  Weight: 260 lb 9.6 oz (118.2 kg)  Height:  (1.854 m)   98% on RA  BMI Readings from Last 3 Encounters:  09/28/22 34.38 kg/m  09/21/22 34.98 kg/m  09/15/22 35.28 kg/m   Wt Readings from Last 3 Encounters:  09/28/22 260 lb 9.6 oz (118.2 kg)  09/21/22 258 lb (117 kg)  09/15/22 260 lb 2 oz (118 kg)   Physical Exam Constitutional:      Appearance: Normal appearance. He is obese.  HENT:     Mouth/Throat:     Mouth: Mucous membranes are moist.  Cardiovascular:     Rate and Rhythm: Normal rate. Rhythm irregular.     Pulses: Normal pulses.     Heart sounds: Normal  heart sounds.  Pulmonary:     Effort: Pulmonary effort is normal. No respiratory distress.     Breath sounds: Normal breath sounds. No wheezing, rhonchi or rales.  Abdominal:     General: There is distension.     Palpations: Abdomen is soft.  Musculoskeletal:     Right lower leg: No edema.     Left lower leg: No edema.  Neurological:     General: No focal deficit present.     Mental Status: He is alert.    Ancillary Information    Past Medical History:  Diagnosis Date   Arthritis    Ascending aortic aneurysm    a. 09/2017 Stable TAA - 5.1cm.   Asthma    Chronic systolic CHF (congestive heart failure)    a. EF 25-30% by echo in 07/2016 with cath showing no significant CAD b. 01/2017: EF 30-35% with diffuse HK and moderate MR; c. 07/2017 Echo: EF 30-35%, diff hK. Mild MR, mildly dil LA. PASP .   Diverticulitis    Diverticulitis of large intestine with perforation without abscess or bleeding 05/13/2017   Hypertension    Hyperthyroidism    NICM (nonischemic cardiomyopathy)    Noncompliance    Persistent atrial fibrillation    a. CHA2DS2VASc = 3-->Eliquis (? compliance).     Family History  Problem Relation Age of Onset   Diabetes Mother    Diabetes Father    Heart disease Brother    Heart attack Brother    Diabetes Brother    Kidney failure Brother    Thyroid disease Neg Hx      Past Surgical History:  Procedure Laterality Date   CARDIOVERSION N/A 06/23/2022   Procedure: CARDIOVERSION;  Surgeon: Dorthula Nettles, DO;  Location: [facility] OR[facility];  Service: Cardiovascular;  Laterality: N/A;   CARDIOVERSION N/A 06/25/2022   Procedure: CARDIOVERSION;  Surgeon: Dorthula Nettles, DO;  Location: [facility];  Service: Cardiovascular;  Laterality: N/A;   COLONOSCOPY N/A 08/27/2019   Procedure: COLONOSCOPY;  Surgeon: Toledo, Boykin Nearing, MD;  Location: [facility]OS[facility];  Service: Gastroenterology;  Laterality: N/A;   ENDOVASCULAR REPAIR/STENT GRAFT N/A 06/17/2022   Procedure:  ENDOVASCULAR REPAIR/STENT GRAFT;  Surgeon: Annice Needy, MD;  Location: [facility] INVASIVE CV LAB;  Service: Cardiovascular;  Laterality: N/A;   JOINT REPLACEMENT Right    RIGHT HEART CATH Right 09/21/2022   Procedure: RIGHT HEART CATH;  Surgeon: Laurey Morale, MD;  Location: [facility] INVASIVE CV LAB;  Service: Cardiovascular;  Laterality: Right;   RIGHT/LEFT HEART CATH AND CORONARY ANGIOGRAPHY N/A 08/02/2016   Procedure: Right/Left Heart Cath and Coronary Angiography;  Surgeon: Iran Ouch, MD;  Location: [facility] INVASIVE CV LAB;  Service: Cardiovascular;  Laterality: N/A;   TEE WITHOUT CARDIOVERSION N/A 06/23/2022   Procedure: TRANSESOPHAGEAL ECHOCARDIOGRAM;  Surgeon: Dorthula Nettles, DO;  Location: [facility];  Service: Cardiovascular;  Laterality: N/A;   TESTICLE SURGERY     Patient states that he had to have the tube fixed.    Social History   Socioeconomic History   Marital status: Single    Spouse name: Not on file   Number of children: Not on file   Years of education: Not on file   Highest education level: Not on file  Occupational History   Occupation: retired  Tobacco Use   Smoking status: Some Days    Packs/day: 0.25    Years: 46.00    Additional pack years: 0.00    Total pack years: 11.50    Types: Pipe, Cigarettes   Smokeless tobacco: Never   Tobacco comments:    1 pack will last three days- 09/28/2022  Vaping Use   Vaping Use: Never used  Substance and Sexual Activity   Alcohol use: Yes    Comment: Socially - once a month   Drug use: No   Sexual activity: Not Currently  Other Topics Concern   Not on file  Social History Narrative   Not on file   Social Determinants of Health   Financial Resource Strain: Low Risk  (07/27/2022)   Overall Financial Resource Strain (CARDIA)    Difficulty of Paying Living Expenses: Not very hard  Food Insecurity: No Food Insecurity (07/27/2022)   Hunger Vital Sign    Worried About Running Out of Food in the Last Year: Never true     Ran Out of Food in the Last Year: Never true  Transportation Needs: No Transportation Needs (07/27/2022)   PRAPARE - Administrator, Civil Service (Medical): No    Lack of Transportation (Non-Medical): No  Recent Concern: Transportation Needs - Unmet Transportation Needs (07/12/2022)   PRAPARE - Administrator, Civil Service (Medical): Yes    Lack of Transportation (Non-Medical): No  Physical Activity: Inactive (07/27/2022)   Exercise Vital Sign    Days of Exercise per Week: 0 days    Minutes of Exercise per Session: 0 min  Stress: No Stress Concern Present (07/27/2022)   [employer] of Occupational Health - Occupational Stress Questionnaire    Feeling of Stress : Not at all  Social Connections: Socially Isolated (07/27/2022)   Social Connection and Isolation Panel [NHANES]    Frequency of Communication with Friends and Family: More than three times a week    Frequency of Social Gatherings with Friends and Family: More than three times a week    Attends Religious Services: Never    Database administrator or Organizations: No    Attends Banker Meetings: Never    Marital Status: Divorced  Catering manager Violence: Not At Risk (07/27/2022)   Humiliation, Afraid, Rape, and Kick questionnaire    Fear of Current or Ex-Partner: No    Emotionally Abused: No    Physically Abused: No    Sexually Abused: No     No Known Allergies   CBC    Component Value Date/Time   WBC 3.7 (  L) 09/15/2022 1505   RBC 5.08 09/15/2022 1505   HGB 13.6 09/21/2022 1439   HGB 13.6 09/21/2022 1439   HGB 14.1 06/29/2022 1605   HCT 40.0 09/21/2022 1439   HCT 40.0 09/21/2022 1439   HCT 41.1 06/29/2022 1605   PLT 177 09/15/2022 1505   PLT 275 06/29/2022 1605   MCV 87.8 09/15/2022 1505   MCV 85 06/29/2022 1605   MCH 28.9 09/15/2022 1505   MCHC 33.0 09/15/2022 1505   RDW 17.4 (H) 09/15/2022 1505   RDW 14.1 06/29/2022 1605   LYMPHSABS 0.7 06/29/2022 1605   MONOABS 0.7  05/14/2017 0351   EOSABS 0.1 06/29/2022 1605   BASOSABS 0.0 06/29/2022 1605    Pulmonary Functions Testing Results:    Latest Ref Rng & Units 07/22/2022    2:54 PM  PFT Results  FVC-Pre L 2.06   FVC-Predicted Pre % 43   FVC-Post L 2.47   FVC-Predicted Post % 52   Pre FEV1/FVC % % 46   Post FEV1/FCV % % 51   FEV1-Pre L 0.94   FEV1-Predicted Pre % 27   FEV1-Post L 1.25   DLCO uncorrected ml/min/mmHg 15.38   DLCO UNC% % 56   DLVA Predicted % 74   TLC L 7.05   TLC % Predicted % 94   RV % Predicted % 154     Outpatient Medications Prior to Visit  Medication Sig Dispense Refill   acetaminophen (TYLENOL) 500 MG tablet Take 500-1,000 mg by mouth every 6 (six) hours as needed for mild pain, fever or headache.      albuterol (VENTOLIN HFA) 108 (90 Base) MCG/ACT inhaler TAKE 2 PUFFS BY MOUTH EVERY 6 HOURS AS NEEDED FOR WHEEZE OR SHORTNESS OF BREATH 6.7 each 3   amiodarone (PACERONE) 200 MG tablet Take 1 tablet (200 mg total) by mouth daily. Take 400mg  daily for 1 week then decrease to 200mg  daily. 30 tablet 3   apixaban (ELIQUIS) 5 MG TABS tablet Take 1 tablet (5 mg total) by mouth 2 (two) times daily. 60 tablet 0   atorvastatin (LIPITOR) 40 MG tablet TAKE 1 TABLET BY MOUTH EVERY DAY 90 tablet 0   digoxin (LANOXIN) 0.125 MG tablet Take 0.5 tablet daily. (0.0625 mg) 45 tablet 3   folic acid (FOLVITE) 1 MG tablet TAKE 1 TABLET BY MOUTH EVERY DAY 90 tablet 2   furosemide (LASIX) 40 MG tablet TAKE 1 TABLET BY MOUTH EVERY DAY 90 tablet 0   hydrocortisone (ANUSOL-HC) 25 MG suppository One suppository twice a day as needed for hemmorhoids, (okay to substitute generic) 24 suppository 1   JARDIANCE 10 MG TABS tablet TAKE 1 TABLET BY MOUTH DAILY BEFORE BREAKFAST. 30 tablet 5   metFORMIN (GLUCOPHAGE) 500 MG tablet Take 1 tablet (500 mg total) by mouth 2 (two) times daily with a meal. 180 tablet 1   metoprolol succinate (TOPROL-XL) 50 MG 24 hr tablet TAKE 1 TABLET BY MOUTH DAILY. TAKE WITH OR  IMMEDIATELY FOLLOWING A MEAL. 90 tablet 0   nicotine (NICODERM CQ) 7 mg/24hr patch Place 1 patch (7 mg total) onto the skin daily. 28 patch 3   pantoprazole (PROTONIX) 40 MG tablet TAKE 1 TABLET BY MOUTH TWICE A DAY 180 tablet 0   potassium chloride SA (KLOR-CON M) 20 MEQ tablet Take 2 tablets (40 mEq total) by mouth daily. 90 tablet 3   sacubitril-valsartan (ENTRESTO) 49-51 MG Take 1 tablet by mouth 2 (two) times daily. 60 tablet 3   spironolactone (ALDACTONE) 25 MG  tablet TAKE 1 TABLET (25 MG TOTAL) BY MOUTH DAILY. 90 tablet 0   thiamine 100 MG tablet Take 1 tablet (100 mg total) by mouth daily. 90 tablet 1   budesonide-formoterol (SYMBICORT) 160-4.5 MCG/ACT inhaler TAKE 2 PUFFS BY MOUTH TWICE A DAY 30.6 each 1   metFORMIN (GLUCOPHAGE) 500 MG tablet Take 500 mg by mouth 2 (two) times daily with a meal.     No facility-administered medications prior to visit.

## 2022-09-28 NOTE — Pre-Procedure Instructions (Signed)
Left voicemail regarding cardioversion tomorrow - arrival time, NPO status, ride home

## 2022-09-28 NOTE — Progress Notes (Deleted)
Synopsis: Referred in *** by Laurey Morale, MD  Assessment & Plan:   1. Centrilobular emphysema *** - Tiotropium Bromide-Olodaterol (STIOLTO RESPIMAT) 2.5-2.5 MCG/ACT AERS; Inhale 2 puffs into the lungs daily.  Dispense: 60 each; Refill: 12   No follow-ups on file.  I spent *** minutes caring for this patient today, including {EM billing:[id number]}  Raechel Chute, MD [facility] 09/28/2022 9:36 AM    End of visit medications:  Meds ordered this encounter  Medications   Tiotropium Bromide-Olodaterol (STIOLTO RESPIMAT) 2.5-2.5 MCG/ACT AERS    Sig: Inhale 2 puffs into the lungs daily.    Dispense:  60 each    Refill:  12     Current Outpatient Medications:    acetaminophen (TYLENOL) 500 MG tablet, Take 500-1,000 mg by mouth every 6 (six) hours as needed for mild pain, fever or headache. , Disp: , Rfl:    albuterol (VENTOLIN HFA) 108 (90 Base) MCG/ACT inhaler, TAKE 2 PUFFS BY MOUTH EVERY 6 HOURS AS NEEDED FOR WHEEZE OR SHORTNESS OF BREATH, Disp: 6.7 each, Rfl: 3   amiodarone (PACERONE) 200 MG tablet, Take 1 tablet (200 mg total) by mouth daily. Take 400mg  daily for 1 week then decrease to 200mg  daily., Disp: 30 tablet, Rfl: 3   apixaban (ELIQUIS) 5 MG TABS tablet, Take 1 tablet (5 mg total) by mouth 2 (two) times daily., Disp: 60 tablet, Rfl: 0   atorvastatin (LIPITOR) 40 MG tablet, TAKE 1 TABLET BY MOUTH EVERY DAY, Disp: 90 tablet, Rfl: 0   digoxin (LANOXIN) 0.125 MG tablet, Take 0.5 tablet daily. (0.0625 mg), Disp: 45 tablet, Rfl: 3   folic acid (FOLVITE) 1 MG tablet, TAKE 1 TABLET BY MOUTH EVERY DAY, Disp: 90 tablet, Rfl: 2   furosemide (LASIX) 40 MG tablet, TAKE 1 TABLET BY MOUTH EVERY DAY, Disp: 90 tablet, Rfl: 0   hydrocortisone (ANUSOL-HC) 25 MG suppository, One suppository twice a day as needed for hemmorhoids, (okay to substitute generic), Disp: 24 suppository, Rfl: 1   JARDIANCE 10 MG TABS tablet, TAKE 1 TABLET BY MOUTH DAILY BEFORE BREAKFAST., Disp:  30 tablet, Rfl: 5   metFORMIN (GLUCOPHAGE) 500 MG tablet, Take 1 tablet (500 mg total) by mouth 2 (two) times daily with a meal., Disp: 180 tablet, Rfl: 1   metoprolol succinate (TOPROL-XL) 50 MG 24 hr tablet, TAKE 1 TABLET BY MOUTH DAILY. TAKE WITH OR IMMEDIATELY FOLLOWING A MEAL., Disp: 90 tablet, Rfl: 0   nicotine (NICODERM CQ) 7 mg/24hr patch, Place 1 patch (7 mg total) onto the skin daily., Disp: 28 patch, Rfl: 3   pantoprazole (PROTONIX) 40 MG tablet, TAKE 1 TABLET BY MOUTH TWICE A DAY, Disp: 180 tablet, Rfl: 0   potassium chloride SA (KLOR-CON M) 20 MEQ tablet, Take 2 tablets (40 mEq total) by mouth daily., Disp: 90 tablet, Rfl: 3   sacubitril-valsartan (ENTRESTO) 49-51 MG, Take 1 tablet by mouth 2 (two) times daily., Disp: 60 tablet, Rfl: 3   spironolactone (ALDACTONE) 25 MG tablet, TAKE 1 TABLET (25 MG TOTAL) BY MOUTH DAILY., Disp: 90 tablet, Rfl: 0   thiamine 100 MG tablet, Take 1 tablet (100 mg total) by mouth daily., Disp: 90 tablet, Rfl: 1   Tiotropium Bromide-Olodaterol (STIOLTO RESPIMAT) 2.5-2.5 MCG/ACT AERS, Inhale 2 puffs into the lungs daily., Disp: 60 each, Rfl: 12   Subjective:   PATIENT ID: Eugene Henderson GENDER: male DOB: 07/03/1950, MRN: [id number]  Chief Complaint  Patient presents with   pulmonary consult    SOB with  exertion, dry cough at times prod with white sputum (cough worsens at night) and wheezing.    HPI ***  Ancillary information including prior medications, full medical/surgical/family/social histories, and PFTs (when available) are listed below and have been reviewed.   ROS   Objective:   Vitals:   09/28/22 0913  BP: 106/60  Pulse: 79  Temp: 97.6 F (36.4 C)  TempSrc: Temporal  SpO2: 98%  Weight: 260 lb 9.6 oz (118.2 kg)  Height: 6\' 1"  (1.854 m)   98% on *** LPM *** RA BMI Readings from Last 3 Encounters:  09/28/22 34.38 kg/m  09/21/22 34.98 kg/m  09/15/22 35.28 kg/m   Wt Readings from Last 3 Encounters:  09/28/22 260 lb 9.6 oz  (118.2 kg)  09/21/22 258 lb (117 kg)  09/15/22 260 lb 2 oz (118 kg)    Physical Exam    Ancillary Information    Past Medical History:  Diagnosis Date   Arthritis    Ascending aortic aneurysm    a. 09/2017 Stable TAA - 5.1cm.   Asthma    Chronic systolic CHF (congestive heart failure)    a. EF 25-30% by echo in 07/2016 with cath showing no significant CAD b. 01/2017: EF 30-35% with diffuse HK and moderate MR; c. 07/2017 Echo: EF 30-35%, diff hK. Mild MR, mildly dil LA. PASP .   Diverticulitis    Diverticulitis of large intestine with perforation without abscess or bleeding 05/13/2017   Hypertension    Hyperthyroidism    NICM (nonischemic cardiomyopathy)    Noncompliance    Persistent atrial fibrillation    a. CHA2DS2VASc = 3-->Eliquis (? compliance).     Family History  Problem Relation Age of Onset   Diabetes Mother    Diabetes Father    Heart disease Brother    Heart attack Brother    Diabetes Brother    Kidney failure Brother    Thyroid disease Neg Hx      Past Surgical History:  Procedure Laterality Date   CARDIOVERSION N/A 06/23/2022   Procedure: CARDIOVERSION;  Surgeon: Dorthula Nettles, DO;  Location: [facility];  Service: Cardiovascular;  Laterality: N/A;   CARDIOVERSION N/A 06/25/2022   Procedure: CARDIOVERSION;  Surgeon: Dorthula Nettles, DO;  Location: [facility];  Service: Cardiovascular;  Laterality: N/A;   COLONOSCOPY N/A 08/27/2019   Procedure: COLONOSCOPY;  Surgeon: Toledo, Boykin Nearing, MD;  Location: [facility];  Service: Gastroenterology;  Laterality: N/A;   ENDOVASCULAR REPAIR/STENT GRAFT N/A 06/17/2022   Procedure: ENDOVASCULAR REPAIR/STENT GRAFT;  Surgeon: Annice Needy, MD;  Location: [facility] INVASIVE CV LAB;  Service: Cardiovascular;  Laterality: N/A;   JOINT REPLACEMENT Right    RIGHT HEART CATH Right 09/21/2022   Procedure: RIGHT HEART CATH;  Surgeon: Laurey Morale, MD;  Location: [facility] INVASIVE CV LAB;  Service: Cardiovascular;  Laterality:  Right;   RIGHT/LEFT HEART CATH AND CORONARY ANGIOGRAPHY N/A 08/02/2016   Procedure: Right/Left Heart Cath and Coronary Angiography;  Surgeon: Iran Ouch, MD;  Location: [facility] INVASIVE CV LAB;  Service: Cardiovascular;  Laterality: N/A;   TEE WITHOUT CARDIOVERSION N/A 06/23/2022   Procedure: TRANSESOPHAGEAL ECHOCARDIOGRAM;  Surgeon: Dorthula Nettles, DO;  Location: [facility];  Service: Cardiovascular;  Laterality: N/A;   TESTICLE SURGERY     Patient states that he had to have the tube fixed.    Social History   Socioeconomic History   Marital status: Single    Spouse name: Not on file   Number of children: Not on file   Years  of education: Not on file   Highest education level: Not on file  Occupational History   Occupation: retired  Tobacco Use   Smoking status: Some Days    Packs/day: 0.25    Years: 46.00    Additional pack years: 0.00    Total pack years: 11.50    Types: Pipe, Cigarettes   Smokeless tobacco: Never   Tobacco comments:    1 pack will last three days- 09/28/2022  Vaping Use   Vaping Use: Never used  Substance and Sexual Activity   Alcohol use: Yes    Comment: Socially - once a month   Drug use: No   Sexual activity: Not Currently  Other Topics Concern   Not on file  Social History Narrative   Not on file   Social Determinants of Health   Financial Resource Strain: Low Risk  (07/27/2022)   Overall Financial Resource Strain (CARDIA)    Difficulty of Paying Living Expenses: Not very hard  Food Insecurity: No Food Insecurity (07/27/2022)   Hunger Vital Sign    Worried About Running Out of Food in the Last Year: Never true    Ran Out of Food in the Last Year: Never true  Transportation Needs: No Transportation Needs (07/27/2022)   PRAPARE - Administrator, Civil Service (Medical): No    Lack of Transportation (Non-Medical): No  Recent Concern: Transportation Needs - Unmet Transportation Needs (07/12/2022)   PRAPARE - Scientist, research (physical sciences) (Medical): Yes    Lack of Transportation (Non-Medical): No  Physical Activity: Inactive (07/27/2022)   Exercise Vital Sign    Days of Exercise per Week: 0 days    Minutes of Exercise per Session: 0 min  Stress: No Stress Concern Present (07/27/2022)   [employer] of Occupational Health - Occupational Stress Questionnaire    Feeling of Stress : Not at all  Social Connections: Socially Isolated (07/27/2022)   Social Connection and Isolation Panel [NHANES]    Frequency of Communication with Friends and Family: More than three times a week    Frequency of Social Gatherings with Friends and Family: More than three times a week    Attends Religious Services: Never    Database administrator or Organizations: No    Attends Banker Meetings: Never    Marital Status: Divorced  Catering manager Violence: Not At Risk (07/27/2022)   Humiliation, Afraid, Rape, and Kick questionnaire    Fear of Current or Ex-Partner: No    Emotionally Abused: No    Physically Abused: No    Sexually Abused: No     No Known Allergies   CBC    Component Value Date/Time   WBC 3.7 (L) 09/15/2022 1505   RBC 5.08 09/15/2022 1505   HGB 13.6 09/21/2022 1439   HGB 13.6 09/21/2022 1439   HGB 14.1 06/29/2022 1605   HCT 40.0 09/21/2022 1439   HCT 40.0 09/21/2022 1439   HCT 41.1 06/29/2022 1605   PLT 177 09/15/2022 1505   PLT 275 06/29/2022 1605   MCV 87.8 09/15/2022 1505   MCV 85 06/29/2022 1605   MCH 28.9 09/15/2022 1505   MCHC 33.0 09/15/2022 1505   RDW 17.4 (H) 09/15/2022 1505   RDW 14.1 06/29/2022 1605   LYMPHSABS 0.7 06/29/2022 1605   MONOABS 0.7 05/14/2017 0351   EOSABS 0.1 06/29/2022 1605   BASOSABS 0.0 06/29/2022 1605    Pulmonary Functions Testing Results:    Latest Ref Rng &  Units 07/22/2022    2:54 PM  PFT Results  FVC-Pre L 2.06   FVC-Predicted Pre % 43   FVC-Post L 2.47   FVC-Predicted Post % 52   Pre FEV1/FVC % % 46   Post FEV1/FCV % % 51   FEV1-Pre L 0.94    FEV1-Predicted Pre % 27   FEV1-Post L 1.25   DLCO uncorrected ml/min/mmHg 15.38   DLCO UNC% % 56   DLVA Predicted % 74   TLC L 7.05   TLC % Predicted % 94   RV % Predicted % 154     Outpatient Medications Prior to Visit  Medication Sig Dispense Refill   acetaminophen (TYLENOL) 500 MG tablet Take 500-1,000 mg by mouth every 6 (six) hours as needed for mild pain, fever or headache.      albuterol (VENTOLIN HFA) 108 (90 Base) MCG/ACT inhaler TAKE 2 PUFFS BY MOUTH EVERY 6 HOURS AS NEEDED FOR WHEEZE OR SHORTNESS OF BREATH 6.7 each 3   amiodarone (PACERONE) 200 MG tablet Take 1 tablet (200 mg total) by mouth daily. Take  daily for 1 week then decrease to  daily. 30 tablet 3   apixaban (ELIQUIS) 5 MG TABS tablet Take 1 tablet (5 mg total) by mouth 2 (two) times daily. 60 tablet 0   atorvastatin (LIPITOR) 40 MG tablet TAKE 1 TABLET BY MOUTH EVERY DAY 90 tablet 0   digoxin (LANOXIN) 0.125 MG tablet Take 0.5 tablet daily. (0.0625 mg) 45 tablet 3   folic acid (FOLVITE) 1 MG tablet TAKE 1 TABLET BY MOUTH EVERY DAY 90 tablet 2   furosemide (LASIX) 40 MG tablet TAKE 1 TABLET BY MOUTH EVERY DAY 90 tablet 0   hydrocortisone (ANUSOL-HC) 25 MG suppository One suppository twice a day as needed for hemmorhoids, (okay to substitute generic) 24 suppository 1   JARDIANCE 10 MG TABS tablet TAKE 1 TABLET BY MOUTH DAILY BEFORE BREAKFAST. 30 tablet 5   metFORMIN (GLUCOPHAGE) 500 MG tablet Take 1 tablet (500 mg total) by mouth 2 (two) times daily with a meal. 180 tablet 1   metoprolol succinate (TOPROL-XL) 50 MG 24 hr tablet TAKE 1 TABLET BY MOUTH DAILY. TAKE WITH OR IMMEDIATELY FOLLOWING A MEAL. 90 tablet 0   nicotine (NICODERM CQ) 7 mg/24hr patch Place 1 patch (7 mg total) onto the skin daily. 28 patch 3   pantoprazole (PROTONIX) 40 MG tablet TAKE 1 TABLET BY MOUTH TWICE A DAY 180 tablet 0   potassium chloride SA (KLOR-CON M) 20 MEQ tablet Take 2 tablets (40 mEq total) by mouth daily. 90 tablet 3    sacubitril-valsartan (ENTRESTO) 49-51 MG Take 1 tablet by mouth 2 (two) times daily. 60 tablet 3   spironolactone (ALDACTONE) 25 MG tablet TAKE 1 TABLET (25 MG TOTAL) BY MOUTH DAILY. 90 tablet 0   thiamine 100 MG tablet Take 1 tablet (100 mg total) by mouth daily. 90 tablet 1   budesonide-formoterol (SYMBICORT) 160-4.5 MCG/ACT inhaler TAKE 2 PUFFS BY MOUTH TWICE A DAY 30.6 each 1   metFORMIN (GLUCOPHAGE) 500 MG tablet Take 500 mg by mouth 2 (two) times daily with a meal.     No facility-administered medications prior to visit.

## 2022-09-29 ENCOUNTER — Ambulatory Visit (HOSPITAL_BASED_OUTPATIENT_CLINIC_OR_DEPARTMENT_OTHER): Payer: Medicare HMO | Admitting: Anesthesiology

## 2022-09-29 ENCOUNTER — Ambulatory Visit (HOSPITAL_COMMUNITY): Payer: Medicare HMO | Admitting: Anesthesiology

## 2022-09-29 ENCOUNTER — Encounter (HOSPITAL_COMMUNITY)
Admission: RE | Disposition: A | Discharge status: Home / Self Care | Payer: Self-pay | Source: Home / Self Care | Attending: Cardiology

## 2022-09-29 ENCOUNTER — Ambulatory Visit (HOSPITAL_COMMUNITY)
Admission: RE | Admit: 2022-09-29 | Discharge: 2022-09-29 | Disposition: A | Discharge status: Home / Self Care | Payer: Medicare HMO | Attending: Cardiology | Admitting: Cardiology

## 2022-09-29 ENCOUNTER — Other Ambulatory Visit: Payer: Self-pay

## 2022-09-29 ENCOUNTER — Encounter (HOSPITAL_COMMUNITY): Payer: Self-pay | Admitting: Cardiology

## 2022-09-29 DIAGNOSIS — J449 Chronic obstructive pulmonary disease, unspecified: Secondary | ICD-10-CM | POA: Diagnosis not present

## 2022-09-29 DIAGNOSIS — I509 Heart failure, unspecified: Secondary | ICD-10-CM | POA: Diagnosis not present

## 2022-09-29 DIAGNOSIS — E1151 Type 2 diabetes mellitus with diabetic peripheral angiopathy without gangrene: Secondary | ICD-10-CM

## 2022-09-29 DIAGNOSIS — Z87891 Personal history of nicotine dependence: Secondary | ICD-10-CM | POA: Insufficient documentation

## 2022-09-29 DIAGNOSIS — I5022 Chronic systolic (congestive) heart failure: Secondary | ICD-10-CM | POA: Diagnosis not present

## 2022-09-29 DIAGNOSIS — I4819 Other persistent atrial fibrillation: Secondary | ICD-10-CM | POA: Insufficient documentation

## 2022-09-29 DIAGNOSIS — F17210 Nicotine dependence, cigarettes, uncomplicated: Secondary | ICD-10-CM | POA: Diagnosis not present

## 2022-09-29 DIAGNOSIS — F17200 Nicotine dependence, unspecified, uncomplicated: Secondary | ICD-10-CM | POA: Diagnosis not present

## 2022-09-29 DIAGNOSIS — I712 Thoracic aortic aneurysm, without rupture, unspecified: Secondary | ICD-10-CM | POA: Insufficient documentation

## 2022-09-29 DIAGNOSIS — I428 Other cardiomyopathies: Secondary | ICD-10-CM | POA: Insufficient documentation

## 2022-09-29 DIAGNOSIS — I110 Hypertensive heart disease with heart failure: Secondary | ICD-10-CM | POA: Diagnosis not present

## 2022-09-29 DIAGNOSIS — I4891 Unspecified atrial fibrillation: Secondary | ICD-10-CM | POA: Diagnosis not present

## 2022-09-29 DIAGNOSIS — Z7901 Long term (current) use of anticoagulants: Secondary | ICD-10-CM | POA: Insufficient documentation

## 2022-09-29 DIAGNOSIS — Z7984 Long term (current) use of oral hypoglycemic drugs: Secondary | ICD-10-CM | POA: Diagnosis not present

## 2022-09-29 DIAGNOSIS — Z79899 Other long term (current) drug therapy: Secondary | ICD-10-CM | POA: Insufficient documentation

## 2022-09-29 DIAGNOSIS — E119 Type 2 diabetes mellitus without complications: Secondary | ICD-10-CM | POA: Insufficient documentation

## 2022-09-29 DIAGNOSIS — I714 Abdominal aortic aneurysm, without rupture, unspecified: Secondary | ICD-10-CM | POA: Insufficient documentation

## 2022-09-29 HISTORY — PX: CARDIOVERSION: SHX1299

## 2022-09-29 SURGERY — CARDIOVERSION
Anesthesia: General

## 2022-09-29 MED ORDER — APIXABAN 5 MG PO TABS
5.0000 mg | ORAL_TABLET | Freq: Two times a day (BID) | ORAL | Status: DC
  Administered 2022-09-29: 5 mg via ORAL
  Filled 2022-09-29 (×2): qty 1

## 2022-09-29 MED ORDER — LIDOCAINE 2% (20 MG/ML) 5 ML SYRINGE
INTRAMUSCULAR | Status: DC | PRN
  Administered 2022-09-29: 30 mg via INTRAVENOUS

## 2022-09-29 MED ORDER — PROPOFOL 10 MG/ML IV BOLUS
INTRAVENOUS | Status: DC | PRN
  Administered 2022-09-29: 20 mg via INTRAVENOUS
  Administered 2022-09-29: 100 mg via INTRAVENOUS
  Administered 2022-09-29: 60 mg via INTRAVENOUS
  Administered 2022-09-29: 30 mg via INTRAVENOUS
  Administered 2022-09-29: 70 mg via INTRAVENOUS

## 2022-09-29 MED ORDER — SODIUM CHLORIDE 0.9 % IV SOLN: INTRAVENOUS | Status: DC

## 2022-09-29 MED ORDER — PHENYLEPHRINE 80 MCG/ML (10ML) SYRINGE FOR IV PUSH (FOR BLOOD PRESSURE SUPPORT)
PREFILLED_SYRINGE | INTRAVENOUS | Status: DC | PRN
  Administered 2022-09-29 (×2): 160 ug via INTRAVENOUS
  Administered 2022-09-29: 80 ug via INTRAVENOUS

## 2022-09-29 SURGICAL SUPPLY — 1 items: ELECT DEFIB PAD ADLT CADENCE (PAD) ×1 IMPLANT

## 2022-09-29 NOTE — Anesthesia Preprocedure Evaluation (Addendum)
Anesthesia Evaluation  Patient identified by MRN, date of birth, ID band Patient awake    Reviewed: Allergy & Precautions, NPO status , Patient's Chart, lab work & pertinent test results, reviewed documented beta blocker date and time   History of Anesthesia Complications Negative for: history of anesthetic complications  Airway Mallampati: I  TM Distance: >3 FB Neck ROM: Full    Dental  (+) Missing, Poor Dentition, Dental Advisory Given, Edentulous Upper   Pulmonary COPD,  COPD inhaler, Current Smoker and Patient abstained from smoking.   breath sounds clear to auscultation       Cardiovascular hypertension, Pt. on medications and Pt. on home beta blockers (-) angina + Peripheral Vascular Disease and +CHF (Entresto)  (-) CAD + dysrhythmias Atrial Fibrillation  Rhythm:Irregular Rate:Normal  09/21/2022 Cath:  1. Mildly elevated RA pressure, normal PCWP. 2. Mild pulmonary venous hypertension. 3. Mildly decreased cardiac output (CI 2.22)  06/2022 ECHO: EF 20-25%. The LV has severely decreased function, global hypokinesis. The left ventricular internal cavity size was mildly dilated.   2. RVF is moderately reduced. The RV size is moderately enlarged. Mildly increased right ventricular wall thickness.   3. Left atrial size was moderately dilated. No left atrial/left atrial appendage thrombus was detected.   4. Right atrial size was severely dilated.   5. There is a very small fibrinous echodensity on the atrial side of the mitral valve measuring 0.9cm; this is unlikely to represent endocarditis. The mitral valve is normal in structure. Moderate to severe MR No evidence of MS.   6. The aortic valve is grossly normal. Aortic valve regurgitation is mild.     Neuro/Psych negative neurological ROS     GI/Hepatic ,GERD  Medicated and Controlled,,(+)       alcohol use, Hepatitis -, C  Endo/Other  diabetes (glu 96), Oral Hypoglycemic  Agents    Renal/GU negative Renal ROS     Musculoskeletal   Abdominal   Peds  Hematology eliquis   Anesthesia Other Findings   Reproductive/Obstetrics                             Anesthesia Physical Anesthesia Plan  ASA: 4  Anesthesia Plan: General   Post-op Pain Management: Minimal or no pain anticipated   Induction: Intravenous  PONV Risk Score and Plan: 1 and Treatment may vary due to age or medical condition  Airway Management Planned: Natural Airway and Mask  Additional Equipment: None  Intra-op Plan:   Post-operative Plan:   Informed Consent: I have reviewed the patients History and Physical, chart, labs and discussed the procedure including the risks, benefits and alternatives for the proposed anesthesia with the patient or authorized representative who has indicated his/her understanding and acceptance.     Dental advisory given  Plan Discussed with: CRNA and Surgeon  Anesthesia Plan Comments:        Anesthesia Quick Evaluation

## 2022-09-29 NOTE — Telephone Encounter (Signed)
Per patient's chart, cardioversion was performed today. Will close encounter.

## 2022-09-29 NOTE — Discharge Instructions (Signed)

## 2022-09-29 NOTE — Anesthesia Postprocedure Evaluation (Signed)
Anesthesia Post Note  Patient: Eugene Henderson  Procedure(s) Performed: CARDIOVERSION     Patient location during evaluation: Cath Lab Anesthesia Type: General Level of consciousness: awake and alert, patient cooperative and oriented Pain management: pain level controlled Vital Signs Assessment: post-procedure vital signs reviewed and stable Respiratory status: spontaneous breathing, nonlabored ventilation and respiratory function stable Cardiovascular status: blood pressure returned to baseline and stable Postop Assessment: no apparent nausea or vomiting and able to ambulate Anesthetic complications: no   No notable events documented.  Last Vitals:  Vitals:   09/29/22 1250 09/29/22 1300  BP: 123/69 129/78  Pulse: 63 75  Resp: 20 20  Temp:    SpO2: 99% 98%    Last Pain:  Vitals:   09/29/22 1300  TempSrc:   PainSc: 0-No pain                 Sajad Glander,E. Deja Pisarski

## 2022-09-29 NOTE — Interval H&P Note (Signed)
History and Physical Interval Note:  09/29/2022 12:00 PM  Eugene Henderson  has presented today for surgery, with the diagnosis of AFIB.  The various methods of treatment have been discussed with the patient and family. After consideration of risks, benefits and other options for treatment, the patient has consented to  Procedure(s): CARDIOVERSION (N/A) as a surgical intervention.  The patient's history has been reviewed, patient examined, no change in status, stable for surgery.  I have reviewed the patient's chart and labs.  Questions were answered to the patient's satisfaction.     Karren Newland [employer]

## 2022-09-29 NOTE — Progress Notes (Signed)
Spoke with Ron from [facility] Department of Social [facility], unable to pick up patient until about 1400.

## 2022-09-29 NOTE — Procedures (Signed)
Electrical Cardioversion Procedure Note Mylen Mangan [id number] 1950-11-15  Procedure: Electrical Cardioversion Indications:  Atrial Fibrillation  Procedure Details Consent: Risks of procedure as well as the alternatives and risks of each were explained to the (patient/caregiver).  Consent for procedure obtained. Time Out: Verified patient identification, verified procedure, site/side was marked, verified correct patient position, special equipment/implants available, medications/allergies/relevent history reviewed, required imaging and test results available.  Performed  Patient placed on cardiac monitor, pulse oximetry, supplemental oxygen as necessary.  Sedation given:  Propofol per anesthesiology Pacer pads placed anterior and posterior chest.  Cardioverted 3 times using sternal pressure Cardioverted at 200J.  Evaluation Findings: Post procedure EKG shows: Atrial Fibrillation Complications: None Patient did tolerate procedure well.   Marca Ancona 09/29/2022, 12:07 PM

## 2022-09-29 NOTE — Transfer of Care (Signed)
Immediate Anesthesia Transfer of Care Note  Patient: Eugene Henderson  Procedure(s) Performed: CARDIOVERSION  Patient Location: PACU and Cath Lab  Anesthesia Type:General  Level of Consciousness: patient cooperative and responds to stimulation  Airway & Oxygen Therapy: Patient Spontanous Breathing  Post-op Assessment: Report given to RN and Post -op Vital signs reviewed and stable  Post vital signs: Reviewed and stable  Last Vitals:  Vitals Value Taken Time  BP 99/64   Temp    Pulse 67   Resp    SpO2 95     Last Pain:  Vitals:   09/29/22 1128  TempSrc: Temporal  PainSc: 0-No pain         Complications: No notable events documented.

## 2022-10-03 ENCOUNTER — Other Ambulatory Visit: Payer: Self-pay | Admitting: Nurse Practitioner

## 2022-10-03 ENCOUNTER — Other Ambulatory Visit: Payer: Self-pay | Admitting: Family Medicine

## 2022-10-04 NOTE — Telephone Encounter (Signed)
Requested Prescriptions  Pending Prescriptions Disp Refills   ELIQUIS 5 MG TABS tablet [Pharmacy Med Name: ELIQUIS 5 MG TABLET] 180 tablet 0    Sig: TAKE 1 TABLET BY MOUTH TWICE A DAY     Hematology:  Anticoagulants - apixaban Passed - 10/03/2022  1:12 AM      Passed - PLT in normal range and within 360 days    Platelets  Date Value Ref Range Status  09/15/2022 177 150 - 400 K/uL Final  06/29/2022 275 150 - 450 x10E3/uL Final         Passed - HGB in normal range and within 360 days    Hemoglobin  Date Value Ref Range Status  09/21/2022 13.6 13.0 - 17.0 g/dL Final  69/62/9528 41.3 13.0 - 17.0 g/dL Final  24/40/1027 25.3 13.0 - 17.7 g/dL Final         Passed - HCT in normal range and within 360 days    HCT  Date Value Ref Range Status  09/21/2022 40.0 39.0 - 52.0 % Final  09/21/2022 40.0 39.0 - 52.0 % Final   Hematocrit  Date Value Ref Range Status  06/29/2022 41.1 37.5 - 51.0 % Final         Passed - Cr in normal range and within 360 days    Creatinine, Ser  Date Value Ref Range Status  09/15/2022 1.03 0.61 - 1.24 mg/dL Final         Passed - AST in normal range and within 360 days    AST  Date Value Ref Range Status  09/15/2022 20 15 - 41 U/L Final         Passed - ALT in normal range and within 360 days    ALT  Date Value Ref Range Status  09/15/2022 17 0 - 44 U/L Final   ALT (SGPT) P5P  Date Value Ref Range Status  05/08/2018 36 0 - 55 IU/L Final         Passed - Valid encounter within last 12 months    Recent Outpatient Visits           1 month ago Post-traumatic osteoarthritis of right knee   [place] [facility] [place], Megan P, DO   1 month ago Encounter for annual wellness exam in Medicare patient   [place] [facility] [place], Megan P, DO   3 months ago Acute gastritis, presence of bleeding unspecified, unspecified gastritis type   [place] [facility] [place], Megan P, DO   1 year ago Routine  general medical examination at a health care facility   [facility] [place], Gerald, DO   1 year ago COVID-19   [facility] [place], Cole, DO       Future Appointments             Tomorrow Dorcas Carrow, DO [place] [facility]   In 2 weeks Sherie Don, NP [facility]

## 2022-10-04 NOTE — Telephone Encounter (Signed)
Unable to refill per protocol, Rx expired Discontinued 06/26/22.  Requested Prescriptions  Pending Prescriptions Disp Refills   metFORMIN (GLUCOPHAGE-XR) 500 MG 24 hr tablet [Pharmacy Med Name: METFORMIN HCL ER 500 MG TABLET] 60 tablet 0    Sig: TAKE 1 TABLET BY MOUTH IN THE MORNING AND AT BEDTIME. OFFICE VISIT NEEDED FOR ADDITIONAL REFILLS     Endocrinology:  Diabetes - Biguanides Passed - 10/03/2022  1:12 AM      Passed - Cr in normal range and within 360 days    Creatinine, Ser  Date Value Ref Range Status  09/15/2022 1.03 0.61 - 1.24 mg/dL Final         Passed - HBA1C is between 0 and 7.9 and within 180 days    HB A1C (BAYER DCA - WAIVED)  Date Value Ref Range Status  06/29/2022 6.9 (H) 4.8 - 5.6 % Final    Comment:             Prediabetes: 5.7 - 6.4          Diabetes: >6.4          Glycemic control for adults with diabetes: <7.0          Passed - eGFR in normal range and within 360 days    GFR calc Af Amer  Date Value Ref Range Status  03/28/2020 96 >59 mL/min/1.73 Final    Comment:    **In accordance with recommendations from the NKF-ASN Task force,**   [facility] is in the process of updating its eGFR calculation to the   2021 CKD-EPI creatinine equation that estimates kidney function   without a race variable.    GFR, Estimated  Date Value Ref Range Status  09/15/2022 >60 >60 mL/min Final    Comment:    (NOTE) Calculated using the CKD-EPI Creatinine Equation (2021)    eGFR  Date Value Ref Range Status  06/29/2022 93 >59 mL/min/1.73 Final         Passed - B12 Level in normal range and within 720 days    Vitamin B-12  Date Value Ref Range Status  06/18/2021 304 232 - 1,245 pg/mL Final         Passed - Valid encounter within last 6 months    Recent Outpatient Visits           1 month ago Post-traumatic osteoarthritis of right knee   [place] [facility] [place], Megan P, DO   1 month ago Encounter for annual wellness exam in Medicare  patient   [place] [facility] [place], Megan P, DO   3 months ago Acute gastritis, presence of bleeding unspecified, unspecified gastritis type   [place] [facility] [place], Megan P, DO   1 year ago Routine general medical examination at a health care facility   [facility] [place], [place], DO   1 year ago COVID-19   [place] [facility] [place], [place], DO       Future Appointments             Tomorrow Dorcas Carrow, DO [place] [facility]   In 2 weeks Sherie Don, NP [facility] - CBC within normal limits and completed in the last 12 months    WBC  Date Value Ref Range Status  09/15/2022 3.7 (L) 4.0 - 10.5 K/uL Final  RBC  Date Value Ref Range Status  09/15/2022 5.08 4.22 - 5.81 MIL/uL Final   Hemoglobin  Date Value Ref Range Status  09/21/2022 13.6 13.0 - 17.0 g/dL Final  16/03/9603 54.0 13.0 - 17.0 g/dL Final  98/04/9146 82.9 13.0 - 17.7 g/dL Final   HCT  Date Value Ref Range Status  09/21/2022 40.0 39.0 - 52.0 % Final  09/21/2022 40.0 39.0 - 52.0 % Final   Hematocrit  Date Value Ref Range Status  06/29/2022 41.1 37.5 - 51.0 % Final   MCHC  Date Value Ref Range Status  09/15/2022 33.0 30.0 - 36.0 g/dL Final   [facility]  Date Value Ref Range Status  09/15/2022 28.9 26.0 - 34.0 pg Final   MCV  Date Value Ref Range Status  09/15/2022 87.8 80.0 - 100.0 fL Final  06/29/2022 85 79 - 97 fL Final   No results found for: "PLTCOUNTKUC", "LABPLAT", "POCPLA" RDW  Date Value Ref Range Status  09/15/2022 17.4 (H) 11.5 - 15.5 % Final  06/29/2022 14.1 11.6 - 15.4 % Final

## 2022-10-05 ENCOUNTER — Encounter: Payer: Self-pay | Admitting: Family Medicine

## 2022-10-05 ENCOUNTER — Ambulatory Visit (INDEPENDENT_AMBULATORY_CARE_PROVIDER_SITE_OTHER): Payer: Medicare HMO | Admitting: Family Medicine

## 2022-10-05 VITALS — BP 98/62 | HR 61 | Temp 97.7°F | Ht 73.0 in | Wt 256.2 lb

## 2022-10-05 DIAGNOSIS — I10 Essential (primary) hypertension: Secondary | ICD-10-CM | POA: Diagnosis not present

## 2022-10-05 DIAGNOSIS — H9193 Unspecified hearing loss, bilateral: Secondary | ICD-10-CM

## 2022-10-05 DIAGNOSIS — I5022 Chronic systolic (congestive) heart failure: Secondary | ICD-10-CM

## 2022-10-05 DIAGNOSIS — E1159 Type 2 diabetes mellitus with other circulatory complications: Secondary | ICD-10-CM | POA: Diagnosis not present

## 2022-10-05 LAB — BAYER DCA HB A1C WAIVED: HB A1C (BAYER DCA - WAIVED): 6.7 % — ABNORMAL HIGH (ref 4.8–5.6)

## 2022-10-05 NOTE — Progress Notes (Signed)
BP 98/62   Pulse 61   Temp 97.7 F (36.5 C) (Oral)   Ht 6\' 1"  (1.854 m)   Wt 256 lb 3.2 oz (116.2 kg)   SpO2 96%   BMI 33.80 kg/m    Subjective:    Patient ID: Eugene Henderson, male    DOB: 12-04-1950, 72 y.o.   MRN: [id number]  HPI: Eugene Henderson is a 72 y.o. male  Chief Complaint  Patient presents with   Diabetes   Hypertension   Hearing Loss    Patient says he would like to discuss having order for Hearing Aids as he is noticing he is having to turn the TV up loud and then now he has to use the caption to know what is going on.    Sleeping Problem   Had cardioversion last week. Did not convert. Did not tolerate the procedure well. He is continuing to follow with cardiology and is planning on having an ICD.   HYPERTENSION  Hypertension status: stable  Satisfied with current treatment? yes Duration of hypertension: chronic BP monitoring frequency:  not checking BP medication side effects:  no Medication compliance: good compliance Aspirin: no Recurrent headaches: no Visual changes: no Palpitations: yes Dyspnea: yes Chest pain: no Lower extremity edema: yes Dizzy/lightheaded: no  DIABETES Hypoglycemic episodes:no Polydipsia/polyuria: no Visual disturbance: no Chest pain: no Paresthesias: no Glucose Monitoring: yes  Accucheck frequency: occasionally Taking Insulin?: no Blood Pressure Monitoring: not checking Retinal Examination: Up to Date Foot Exam: Up to Date Diabetic Education: Completed Pneumovax: Up to Date Influenza: Up to Date Aspirin: no    Relevant past medical, surgical, family and social history reviewed and updated as indicated. Interim medical history since our last visit reviewed. Allergies and medications reviewed and updated.  Review of Systems  Constitutional: Negative.   HENT:  Positive for hearing loss. Negative for congestion, dental problem, drooling, ear discharge, ear pain, facial swelling, mouth sores, nosebleeds, postnasal  drip, rhinorrhea, sinus pressure, sinus pain, sneezing, sore throat, tinnitus, trouble swallowing and voice change.   Eyes: Negative.   Respiratory: Negative.    Cardiovascular: Negative.   Gastrointestinal: Negative.   Musculoskeletal: Negative.   Neurological: Negative.   Psychiatric/Behavioral: Negative.      Per HPI unless specifically indicated above     Objective:    BP 98/62   Pulse 61   Temp 97.7 F (36.5 C) (Oral)   Ht 6\' 1"  (1.854 m)   Wt 256 lb 3.2 oz (116.2 kg)   SpO2 96%   BMI 33.80 kg/m   Wt Readings from Last 3 Encounters:  10/05/22 256 lb 3.2 oz (116.2 kg)  09/28/22 260 lb 9.6 oz (118.2 kg)  09/21/22 258 lb (117 kg)    Physical Exam Vitals and nursing note reviewed.  Constitutional:      General: He is not in acute distress.    Appearance: Normal appearance. He is not ill-appearing, toxic-appearing or diaphoretic.  HENT:     Head: Normocephalic and atraumatic.     Right Ear: External ear normal.     Left Ear: External ear normal.     Nose: Nose normal.     Mouth/Throat:     Mouth: Mucous membranes are moist.     Pharynx: Oropharynx is clear.  Eyes:     General: No scleral icterus.       Right eye: No discharge.        Left eye: No discharge.     Extraocular Movements: Extraocular movements intact.  Conjunctiva/sclera: Conjunctivae normal.     Pupils: Pupils are equal, round, and reactive to light.  Cardiovascular:     Rate and Rhythm: Normal rate and regular rhythm.     Pulses: Normal pulses.     Heart sounds: Normal heart sounds. No murmur heard.    No friction rub. No gallop.  Pulmonary:     Effort: Pulmonary effort is normal. No respiratory distress.     Breath sounds: Normal breath sounds. No stridor. No wheezing, rhonchi or rales.  Chest:     Chest wall: No tenderness.  Musculoskeletal:        General: Normal range of motion.     Cervical back: Normal range of motion and neck supple.     Right lower leg: No edema.     Left lower  leg: No edema.  Skin:    General: Skin is warm and dry.     Capillary Refill: Capillary refill takes less than 2 seconds.     Coloration: Skin is not jaundiced or pale.     Findings: No bruising, erythema, lesion or rash.  Neurological:     General: No focal deficit present.     Mental Status: He is alert and oriented to person, place, and time. Mental status is at baseline.  Psychiatric:        Mood and Affect: Mood normal.        Behavior: Behavior normal.        Thought Content: Thought content normal.        Judgment: Judgment normal.     Results for orders placed or performed in visit on 10/05/22  Bayer DCA Hb A1c Waived  Result Value Ref Range   HB A1C (BAYER DCA - WAIVED) 6.7 (H) 4.8 - 5.6 %      Assessment & Plan:   Problem List Items Addressed This Visit       Cardiovascular and Mediastinum   Chronic systolic heart failure (HCC) (Chronic)    Continue to follow with cardiology. Continue to monitor.       Essential hypertension    Stable. BP low, but needs to be due to CHF. Continue to follow with cardiology. Call with any concerns.       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 Continue current regimen. Continue to monitor. Refills given. Call with any concerns.       Relevant Orders   Bayer DCA Hb A1c Waived (Completed)   Other Visit Diagnoses     Bilateral hearing loss, unspecified hearing loss type       Referral to audiology placed today. Call with any concerns.   Relevant Orders   Ambulatory referral to Audiology        Follow up plan: Return Tomorrow 3:30 Eye exam then 3 months with me.

## 2022-10-06 LAB — HM DIABETES EYE EXAM

## 2022-10-08 ENCOUNTER — Encounter: Payer: Self-pay | Admitting: Family Medicine

## 2022-10-08 NOTE — Assessment & Plan Note (Signed)
Continue to follow with cardiology. Continue to monitor.  

## 2022-10-08 NOTE — Assessment & Plan Note (Signed)
Stable. BP low, but needs to be due to CHF. Continue to follow with cardiology. Call with any concerns.

## 2022-10-08 NOTE — Assessment & Plan Note (Signed)
Stable with A1c of 6.7. Continue current regimen. Continue to monitor. Refills given. Call with any concerns.

## 2022-10-12 ENCOUNTER — Other Ambulatory Visit: Payer: Self-pay | Admitting: Family Medicine

## 2022-10-12 NOTE — Telephone Encounter (Signed)
Requested Prescriptions  Pending Prescriptions Disp Refills   albuterol (VENTOLIN HFA) 108 (90 Base) MCG/ACT inhaler [Pharmacy Med Name: ALBUTEROL HFA (PROVENTIL) INH] 6.7 each 3    Sig: TAKE 2 PUFFS BY MOUTH EVERY 6 HOURS AS NEEDED FOR WHEEZE OR SHORTNESS OF BREATH     Pulmonology:  Beta Agonists 2 Passed - 10/12/2022  2:31 AM      Passed - Last BP in normal range    BP Readings from Last 1 Encounters:  10/05/22 98/62         Passed - Last Heart Rate in normal range    Pulse Readings from Last 1 Encounters:  10/05/22 61         Passed - Valid encounter within last 12 months    Recent Outpatient Visits           1 week ago Diabetes mellitus with cardiac complication [facility])   [place] [facility] [place], Megan P, DO   1 month ago Post-traumatic osteoarthritis of right knee   [place] [facility] [place], Megan P, DO   1 month ago Encounter for annual wellness exam in Medicare patient   [place] [facility] [place], Megan P, DO   3 months ago Acute gastritis, presence of bleeding unspecified, unspecified gastritis type   [place] [facility] [place], Megan P, DO   1 year ago Routine general medical examination at a health care facility   [facility] [place] Carrow, DO       Future Appointments             In 6 days Sherie Don, NP [place] [facility] [place]   In 3 months Dorcas Carrow, DO [place] [facility]

## 2022-10-14 ENCOUNTER — Encounter: Payer: Self-pay | Admitting: Family Medicine

## 2022-10-14 NOTE — Progress Notes (Signed)
Cardiology Office Note Date:  10/18/2022  Patient ID:  Eugene Henderson, DOB Feb 10, 1951, MRN [id number] PCP:  Dorcas Carrow, DO  Cardiologist:  Lorine Bears, MD HF Cardiologist: Marca Ancona, MD Electrophysiologist: Lanier Prude, MD   Chief Complaint: perm AFib, ICD discussion  History of Present Illness: Eugene Henderson is a 72 y.o. male with PMH notable for persistent A-fib (?permanent), HFrEF, NICM, HTN, T2DM, tobacco use; seen today for Lanier Prude, MD for routine electrophysiology followup.  Last saw Dr. Lalla Brothers 08/2022, was back in A-fib despite being on amiodarone, and questioned whether the A-fib is permanent.  Dr. Lalla Brothers discussed ICD placement with the patient, who did not want to proceed with implant. Saw Dr. Shirlee Latch 09/2022 who recommended RHC, possibly LVAD.  Patient had thought more about an ICD, and did wish to proceed with implant. Pt in afib at that time and set up for DCCV.  DCCV on 4/24, attempted 3x with sternal pressure at 200J without converting to NSR.   Today, the patient tells me that he is interested in pursuing ICD at next available. He is worried about being NPO for the procedure as he is a night-time snacker while watching TV. Good appetite. He was SOB walking from waiting room to exam room today, and had to take frequent breaks. His SOB is not new or worse. He denies chest pain, does not think he has edema. Has been staying around #260. Continues to have robust UOP with lasix.   No bleeding concerns on eliquis  AAD History: Amiodarone   Past Medical History:  Diagnosis Date   Arthritis    Ascending aortic aneurysm (HCC)    a. 09/2017 Stable TAA - 5.1cm.   Asthma    Chronic systolic CHF (congestive heart failure) (HCC)    a. EF 25-30% by echo in 07/2016 with cath showing no significant CAD b. 01/2017: EF 30-35% with diffuse HK and moderate MR; c. 07/2017 Echo: EF 30-35%, diff hK. Mild MR, mildly dil LA. PASP .   Diverticulitis     Diverticulitis of large intestine with perforation without abscess or bleeding 05/13/2017   Hypertension    Hyperthyroidism    NICM (nonischemic cardiomyopathy) (HCC)    Noncompliance    Persistent atrial fibrillation (HCC)    a. CHA2DS2VASc = 3-->Eliquis (? compliance).    Past Surgical History:  Procedure Laterality Date   CARDIOVERSION N/A 06/23/2022   Procedure: CARDIOVERSION;  Surgeon: Dorthula Nettles, DO;  Location: [facility] ORS;  Service: Cardiovascular;  Laterality: N/A;   CARDIOVERSION N/A 06/25/2022   Procedure: CARDIOVERSION;  Surgeon: Dorthula Nettles, DO;  Location: [facility] ORS;  Service: Cardiovascular;  Laterality: N/A;   CARDIOVERSION N/A 09/29/2022   Procedure: CARDIOVERSION;  Surgeon: Laurey Morale, MD;  Location: [facility] INVASIVE CV LAB;  Service: Cardiovascular;  Laterality: N/A;   COLONOSCOPY N/A 08/27/2019   Procedure: COLONOSCOPY;  Surgeon: Toledo, Boykin Nearing, MD;  Location: [facility] ENDOSCOPY;  Service: Gastroenterology;  Laterality: N/A;   ENDOVASCULAR REPAIR/STENT GRAFT N/A 06/17/2022   Procedure: ENDOVASCULAR REPAIR/STENT GRAFT;  Surgeon: Annice Needy, MD;  Location: [facility] INVASIVE CV LAB;  Service: Cardiovascular;  Laterality: N/A;   JOINT REPLACEMENT Right    RIGHT HEART CATH Right 09/21/2022   Procedure: RIGHT HEART CATH;  Surgeon: Laurey Morale, MD;  Location: [facility] INVASIVE CV LAB;  Service: Cardiovascular;  Laterality: Right;   RIGHT/LEFT HEART CATH AND CORONARY ANGIOGRAPHY N/A 08/02/2016   Procedure: Right/Left Heart Cath and Coronary Angiography;  Surgeon: Iran Ouch, MD;  Location: [facility] INVASIVE CV LAB;  Service: Cardiovascular;  Laterality: N/A;   TEE WITHOUT CARDIOVERSION N/A 06/23/2022   Procedure: TRANSESOPHAGEAL ECHOCARDIOGRAM;  Surgeon: Dorthula Nettles, DO;  Location: [facility] ORS;  Service: Cardiovascular;  Laterality: N/A;   TESTICLE SURGERY     Patient states that he had to have the tube fixed.    Current Outpatient Medications  Medication Instructions    acetaminophen (TYLENOL) 500-1,000 mg, Oral, Every 6 hours PRN   albuterol (VENTOLIN HFA) 108 (90 Base) MCG/ACT inhaler TAKE 2 PUFFS BY MOUTH EVERY 6 HOURS AS NEEDED FOR WHEEZE OR SHORTNESS OF BREATH   amiodarone (PACERONE) 200 mg, Oral, Daily   atorvastatin (LIPITOR) 40 MG tablet TAKE 1 TABLET BY MOUTH EVERY DAY   digoxin (LANOXIN) 0.125 MG tablet Take 0.5 tablet daily. (0.0625 mg)   Eliquis 5 mg, Oral, 2 times daily   folic acid (FOLVITE) 1 mg, Oral, Daily   furosemide (LASIX) 40 MG tablet TAKE 1 TABLET BY MOUTH EVERY DAY   ibuprofen (ADVIL) 800 mg, Oral, Every 6 hours PRN   Jardiance 10 mg, Oral, Daily before breakfast   metFORMIN (GLUCOPHAGE) 500 mg, Oral, 2 times daily with meals   metoprolol succinate (TOPROL-XL) 50 mg, Oral, Daily, TAKE WITH OR IMMEDIATELY FOLLOWING A MEAL.   pantoprazole (PROTONIX) 40 mg, Oral, 2 times daily   potassium chloride SA (KLOR-CON M) 20 MEQ tablet 20 mEq, Oral, 2 times daily   prednisoLONE acetate (PRED FORTE) 1 % ophthalmic suspension 1 drop, 4 times daily   sacubitril-valsartan (ENTRESTO) 49-51 MG 1 tablet, Oral, 2 times daily   spironolactone (ALDACTONE) 25 MG tablet Oral, Daily   Tiotropium Bromide-Olodaterol (STIOLTO RESPIMAT) 2.5-2.5 MCG/ACT AERS 2 puffs, Inhalation, Daily    Social History:  The patient  reports that he has been smoking pipe and cigarettes. He has a 11.50 pack-year smoking history. He has never used smokeless tobacco. He reports current alcohol use. He reports that he does not use drugs.   Family History:  The patient's family history includes Diabetes in his brother, father, and mother; Heart attack in his brother; Heart disease in his brother; Kidney failure in his brother.  ROS:  Please see the history of present illness. All other systems are reviewed and otherwise negative.   PHYSICAL EXAM:  VS:  BP 120/84 (BP Location: Left Arm, Patient Position: Sitting, Cuff Size: Large)   Pulse 95   Ht 6\' 1"  (1.854 m)   Wt 260 lb  (117.9 kg)   SpO2 97%   BMI 34.30 kg/m  BMI: Body mass index is 34.3 kg/m.  GEN- The patient is well appearing, alert and oriented x 3 today.   Lungs- Clear to ausculation bilaterally, normal work of breathing.  Heart- Irregularly irregular rate and rhythm, no murmurs, rubs or gallops Extremities- No peripheral edema, warm, dry   EKG is ordered. Personal review of EKG from today shows: Typical clockwise Aflutter, rate 95bpm; LAD  Recent Labs: 06/22/2022: Magnesium 2.2 09/15/2022: ALT 17; B Natriuretic Peptide 114.3; BUN 16; Creatinine, Ser 1.03; Platelets 177; TSH 3.371 09/21/2022: Hemoglobin 13.6; Hemoglobin 13.6; Potassium 4.0; Potassium 4.1; Sodium 138; Sodium 137  06/29/2022: Cholesterol, Total 100; HDL 57; LDL Chol Calc (NIH) 30; Triglycerides 57   CrCl cannot be calculated (Patient's most recent lab result is older than the maximum 21 days allowed.).   Wt Readings from Last 3 Encounters:  10/18/22 260 lb (117.9 kg)  10/05/22 256 lb 3.2 oz (116.2 kg)  09/28/22 260 lb 9.6 oz (118.2 kg)  Additional studies reviewed include: Previous EP, cardiology notes.   RHC, 09/21/2022 1. Mildly elevated RA pressure, normal PCWP.  2. Mild pulmonary venous hypertension.  3. Mildly decreased cardiac output (CI 2.22), preserved PAPi.   Cardiac MRI, 08/04/2022 1. Severely reduced LV function, LVEF 17%.  2. Small lateral wall subendocardial LGE (scar) indicating possible prior infarct.  3. Mid wall LGE (scar) in the LV basal-mid septal wall at RV insertion points suggesting non ischemic etiology.  4.  No diffuse myocardial scar or infiltrative disease noted.  5.  Moderately dilated ascending aorta, 47 mm in diameter.  6.  Moderately reduced RV function.  7.  Findings suggest non ischemic cardiomyopathy.    ASSESSMENT AND PLAN:  #) perm AFib Chronic amiodarone, labs up to date V-rates well controlled CHA2DS2-VASc Score = 4 [CHF History: 1, HTN History: 1, Diabetes History: 1, Stroke  History: 0, Vascular Disease History: 0, Age Score: 1, Gender Score: 0].  Therefore, the patient's annual risk of stroke is 4.8 % NOAC - 5mg  eliquis BID, appropriately dosed.     #) HFrEF, NICM NYHA III symptoms Warm and dry on exam GDMT: jardiance, toprol, entresto 49-51, spiro; digoxin Diuretic: 40mg  lasix daily  Reviewed risks/benefits of procedure. He verbalized understanding and wished to proceed, scheduled for 5/29 -pre-procedure labs today   Current medicines are reviewed at length with the patient today.   The patient does not have concerns regarding his medicines.  The following changes were made today:  none  Labs/ tests ordered today include:  Orders Placed This Encounter  Procedures   CBC   Basic Metabolic Panel (BMET)   EKG 12-Lead     Disposition: Follow up with Dr. Lalla Brothers in as usual post procedure   Signed, Sherie Don, NP  10/18/22  1:53 PM  Electrophysiology [facility]

## 2022-10-18 ENCOUNTER — Ambulatory Visit: Payer: Medicare HMO | Attending: Cardiology | Admitting: Cardiology

## 2022-10-18 ENCOUNTER — Other Ambulatory Visit
Admission: RE | Admit: 2022-10-18 | Discharge: 2022-10-18 | Disposition: A | Discharge status: Home / Self Care | Payer: Medicare HMO | Source: Ambulatory Visit | Attending: Cardiology | Admitting: Cardiology

## 2022-10-18 ENCOUNTER — Encounter: Payer: Self-pay | Admitting: Cardiology

## 2022-10-18 ENCOUNTER — Telehealth: Payer: Self-pay

## 2022-10-18 VITALS — BP 120/84 | HR 95 | Ht 73.0 in | Wt 260.0 lb

## 2022-10-18 DIAGNOSIS — D6869 Other thrombophilia: Secondary | ICD-10-CM

## 2022-10-18 DIAGNOSIS — Z79899 Other long term (current) drug therapy: Secondary | ICD-10-CM

## 2022-10-18 DIAGNOSIS — I4821 Permanent atrial fibrillation: Secondary | ICD-10-CM

## 2022-10-18 DIAGNOSIS — I5022 Chronic systolic (congestive) heart failure: Secondary | ICD-10-CM | POA: Insufficient documentation

## 2022-10-18 LAB — BASIC METABOLIC PANEL
Anion gap: 9 (ref 5–15)
BUN: 18 mg/dL (ref 8–23)
CO2: 25 mmol/L (ref 22–32)
Calcium: 9 mg/dL (ref 8.9–10.3)
Chloride: 105 mmol/L (ref 98–111)
Creatinine, Ser: 1.32 mg/dL — ABNORMAL HIGH (ref 0.61–1.24)
GFR, Estimated: 58 mL/min — ABNORMAL LOW (ref >60)
Glucose, Bld: 102 mg/dL — ABNORMAL HIGH (ref 70–99)
Potassium: 4.3 mmol/L (ref 3.5–5.1)
Sodium: 139 mmol/L (ref 135–145)

## 2022-10-18 LAB — CBC
HCT: 41.7 % (ref 39.0–52.0)
Hemoglobin: 13.9 g/dL (ref 13.0–17.0)
MCH: 29.6 pg (ref 26.0–34.0)
MCHC: 33.3 g/dL (ref 30.0–36.0)
MCV: 88.9 fL (ref 80.0–100.0)
Platelets: 161 10*3/uL (ref 150–400)
RBC: 4.69 MIL/uL (ref 4.22–5.81)
RDW: 16.6 % — ABNORMAL HIGH (ref 11.5–15.5)
WBC: 4.8 10*3/uL (ref 4.0–10.5)
nRBC: 0 % (ref 0.0–0.2)

## 2022-10-18 NOTE — Telephone Encounter (Signed)
Pt is scheduled at [facility] for a VDD ICD Implant with Dr. Lalla Brothers.   [employer] rep Tourist information centre manager) has been notified.Marland KitchenMarland KitchenMarland Kitchen

## 2022-10-18 NOTE — Patient Instructions (Signed)
Medication Instructions:  Your physician recommends that you continue on your current medications as directed. Please refer to the Current Medication list given to you today.  *If you need a refill on your cardiac medications before your next appointment, please call your pharmacy*   Lab Work: CBC and BMP - Please go to the [facility]. You will check in at the front desk to the right as you walk into the atrium. Valet Parking is offered if needed. - No appointment needed. You may go any day between 7 am and 6 pm. If you have labs (blood work) drawn today and your tests are completely normal, you will receive your results only by: MyChart Message (if you have MyChart) OR A paper copy in the mail If you have any lab test that is abnormal or we need to change your treatment, we will call you to review the results.   Testing/Procedures: No testing ordered  Follow-Up: At [facility], you and your health needs are our priority.  As part of our continuing mission to provide you with exceptional heart care, we have created designated Provider Care Teams.  These Care Teams include your primary Cardiologist (physician) and Advanced Practice Providers (APPs -  Physician Assistants and Nurse Practitioners) who all work together to provide you with the care you need, when you need it.  We recommend signing up for the patient portal called "MyChart".  Sign up information is provided on this After Visit Summary.  MyChart is used to connect with patients for Virtual Visits (Telemedicine).  Patients are able to view lab/test results, encounter notes, upcoming appointments, etc.  Non-urgent messages can be sent to your provider as well.   To learn more about what you can do with MyChart, go to [email or web address].    Your next appointment:   To be determined after ICD placement  Provider:   Steffanie Dunn, MD

## 2022-10-20 ENCOUNTER — Other Ambulatory Visit: Payer: Self-pay | Admitting: Family Medicine

## 2022-10-20 ENCOUNTER — Ambulatory Visit (INDEPENDENT_AMBULATORY_CARE_PROVIDER_SITE_OTHER): Payer: Medicare HMO

## 2022-10-20 ENCOUNTER — Telehealth: Payer: Medicare HMO

## 2022-10-20 DIAGNOSIS — I5022 Chronic systolic (congestive) heart failure: Secondary | ICD-10-CM

## 2022-10-20 DIAGNOSIS — E1159 Type 2 diabetes mellitus with other circulatory complications: Secondary | ICD-10-CM

## 2022-10-20 NOTE — Telephone Encounter (Signed)
Requested Prescriptions  Pending Prescriptions Disp Refills   spironolactone (ALDACTONE) 25 MG tablet [Pharmacy Med Name: SPIRONOLACTONE 25 MG TABLET] 90 tablet 0    Sig: TAKE 1 TABLET (25 MG TOTAL) BY MOUTH DAILY.     Cardiovascular: Diuretics - Aldosterone Antagonist Failed - 10/20/2022  2:25 AM      Failed - Cr in normal range and within 180 days    Creatinine, Ser  Date Value Ref Range Status  10/18/2022 1.32 (H) 0.61 - 1.24 mg/dL Final         Passed - K in normal range and within 180 days    Potassium  Date Value Ref Range Status  10/18/2022 4.3 3.5 - 5.1 mmol/L Final         Passed - Na in normal range and within 180 days    Sodium  Date Value Ref Range Status  10/18/2022 139 135 - 145 mmol/L Final  06/29/2022 134 134 - 144 mmol/L Final         Passed - eGFR is 30 or above and within 180 days    GFR calc Af Amer  Date Value Ref Range Status  03/28/2020 96 >59 mL/min/1.73 Final    Comment:    **In accordance with recommendations from the NKF-ASN Task force,**   [facility] is in the process of updating its eGFR calculation to the   2021 CKD-EPI creatinine equation that estimates kidney function   without a race variable.    GFR, Estimated  Date Value Ref Range Status  10/18/2022 58 (L) >60 mL/min Final    Comment:    (NOTE) Calculated using the CKD-EPI Creatinine Equation (2021)    eGFR  Date Value Ref Range Status  06/29/2022 93 >59 mL/min/1.73 Final         Passed - Last BP in normal range    BP Readings from Last 1 Encounters:  10/18/22 120/84         Passed - Valid encounter within last 6 months    Recent Outpatient Visits           2 weeks ago Diabetes mellitus with cardiac complication [facility])   [facility] [place], Megan P, DO   1 month ago Post-traumatic osteoarthritis of right knee   [facility] [place], Megan P, DO   2 months ago Encounter for annual wellness exam in Medicare patient   [facility] [place], Megan P, DO   3 months ago Acute gastritis, presence of bleeding unspecified, unspecified gastritis type   [facility] [place], Megan P, DO   1 year ago Routine general medical examination at a health care facility   [facility] Dorcas Carrow, DO       Future Appointments             In 2 months Dorcas Carrow, DO [facility]

## 2022-10-20 NOTE — Chronic Care Management (AMB) (Signed)
Chronic Care Management   CCM RN Visit Note  10/20/2022 Name: Eugene Henderson MRN: [id number] DOB: 1950-08-26  Subjective: Eugene Henderson is a 72 y.o. year old male who is a primary care patient of Kayleb, Baggarly, DO. The patient was referred to the Chronic Care Management team for assistance with care management needs subsequent to provider initiation of CCM services and plan of care.    Today's Visit:   Spoke to the patients step daughter and DPR, Eugene Henderson  for follow up visit.        Goals Addressed             This Visit's Progress    CCM Expected Outcome:  Monitor, Self-Manage and Reduce Symptoms of Diabetes       Current Barriers:  Knowledge Deficits related to how often to check blood sugars and the goal of A1C levels Care Coordination needs related to the need for a new motorized chair and asking for recommendations for exercise like water aerobics in a patient with DM Chronic Disease Management support and education needs related to effective management of DM Lab Results  Component Value Date   HGBA1C 6.7 (H) 10/05/2022     Planned Interventions: Provided education to patient about basic DM disease process. Education on the goal of A1C. The patient is right at goal. Education provided on monitoring for blood sugar changes and what the A1C test was and the goal of keeping A1C <7.0 ; Reviewed medications with patient and discussed importance of medication adherence. The patient is compliant with medications. His daughter assist him with managing his medications. ;        Reviewed prescribed diet with patient heart healthy/ADA diet. The patient is eating well. Review with the patients other daughter the importance of monitoring for changes in dietary habits and watching fats, salts, and sweets. Counseled on importance of regular laboratory monitoring as prescribed. Trend down in A1C, education provided. ;        Discussed plans with patient for ongoing care management  follow up and provided patient with direct contact information for care management team;      Provided patient with written educational materials related to hypo and hyperglycemia and importance of correct treatment;       Reviewed scheduled/upcoming provider appointments including: 01-10-2023 at 940 am- reminder provided today;         Advised patient, providing education and rationale, to check cbg when you have symptoms of low or high blood sugar and as directed by the pcp  and record. Per the patients daughter Eugene they have no way of checking blood sugars. The patient is at goal. Did advise if she felt this is something that would be beneficial for him that Dr. Laural Benes could write an order for a meter. Will continue to monitor for changes.        call provider for findings outside established parameters;       Referral made to social work team for assistance with support and education for expressed needs. Has talked with the LCSW and knows the support of social work for expressed needs Referral made to community resources care guide team for assistance with has talked with the care guides for assistance with food resources and other expressed needs. 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 Wears glasses but cannot see out of them well. States he needs a new eye exam and she will look into this. DME needs: The patients daughter stats that since the patient can ambulate it is harder to get a replacement chair because insurance will not pay. The patient is working with PT and his daughters encourage him to walk.   Also provided the patients daughter with the number to: [employer]: [phone] for additional help with equipment needs as they help patients get needed medical equipment.  She states she needs a wider shower chair for the patient.   Symptom Management: Take medications as prescribed   Attend all scheduled provider appointments Call provider office for new concerns or questions  call the Suicide and Crisis Lifeline: 988 call the [place] National Suicide Prevention Lifeline: 346 152 0868 or TTY: (734) 087-4209 TTY 864-136-7523) to talk to a trained counselor call 1-800-273-TALK (toll free, 24 hour hotline) if experiencing a Mental Health or Behavioral Health Crisis  schedule appointment with eye doctor check blood sugar at prescribed times: when you have symptoms of low or high blood sugar and as ordered by the pcp  check feet daily for cuts, sores or redness trim toenails straight across wash and dry feet carefully every day wear comfortable, cotton socks wear comfortable, well-fitting shoes  Follow Up Plan: Telephone follow up appointment with care management team member scheduled for: 12-07-2022 at 230 pm       CCM Expected Outcome:  Monitor, Self-Manage and Reduce Symptoms of Heart Failure       Current Barriers:  Knowledge Deficits related to seeing cardiologist and other specialist on a regular basis to effectively manage HF and other chronic heart conditions.  Care Coordination needs related to medication needs and other expressed needs involving interdisciplinary team involvement in a patient with HF and other chronic conditions Chronic Disease Management support and education needs related to effective management of HF and other chronic heart conditions Wt Readings from Last 3 Encounters:  10/18/22 260 lb (117.9 kg)  10/05/22 256 lb 3.2 oz (116.2 kg)  09/28/22 260 lb 9.6 oz (118.2 kg)     Planned Interventions: Basic overview and discussion of pathophysiology of Heart  Failure reviewed. The patient is going to have an ICD placed the end of the month for help with managing his AFIB. Education provided to his daughter today and will attach information to the AVS for patient education. Encouraged the daughter to write down questions to ask the cardiologist and that the patient may need to hold some of his medications before the procedure.  Provided education on low sodium diet. Education provided on heart healthy/ADA diet and will send information in the mail for planning healthy meals. Reviewed Heart Failure Action Plan in depth, review of monitoring for swelling and edema in bilateral legs  Assessed need for readable accurate scales in home Provided education about placing scale on hard, flat surface Advised patient to weigh each morning after emptying bladder Discussed importance of daily weight and advised patient to weigh and record daily Reviewed role of diuretics in prevention of fluid overload and management of heart failure. Review of medications and the patients daughter states compliance with medications. She  helps the patient manage his medications Discussed the importance of keeping all appointments with provider. 07-29-2022 at 420 pm with the pcp, reminder given today. Provided patient with education about the role of exercise in the management of heart failure. The patients daughter is interested in the patient doing water aerobics as this has helped her with walking and balance.  Referral made to community resources care guide team for assistance with the patients daughter has spoke to the LCSW and the care guide team about resources to help with food and other expressed needs Advised patient to discuss changes in heart failure, questions, and concerns with provider Screening for signs and symptoms of depression related to chronic disease state  Assessed social determinant of health barriers The patients daughter states that he has stopped smoking and is  using patches. They have also convinced him to decrease his alcohol intake. She states that they also are trying to get him to drink more water to stay hydrated.   Symptom Management: Take medications as prescribed   Attend all scheduled provider appointments Call provider office for new concerns or questions  call the Suicide and Crisis Lifeline: 988 call the [place] National Suicide Prevention Lifeline: 424-440-7302 or TTY: 937-450-8809 TTY 4122967755) to talk to a trained counselor call 1-800-273-TALK (toll free, 24 hour hotline) if experiencing a Mental Health or Behavioral Health Crisis  call office if I gain more than 2 pounds in one day or 5 pounds in one week track weight in diary use salt in moderation watch for swelling in feet, ankles and legs every day weigh myself daily develop a rescue plan follow rescue plan if symptoms flare-up track symptoms and what helps feel better or worse dress right for the weather, hot or cold  Follow Up Plan: Telephone follow up appointment with care management team member scheduled for: 12-07-2022 at 230 pm          Plan:Telephone follow up appointment with care management team member scheduled for:  12-07-2022 at 230 pm  Alto Denver RN, MSN, CCM RN Care Manager  Chronic Care Management Direct Number: [phone]

## 2022-10-20 NOTE — Patient Instructions (Addendum)
Please call the care guide team at [phone] if you need to cancel or reschedule your appointment.   If you are experiencing a Mental Health or Behavioral Health Crisis or need someone to talk to, please call the Suicide and Crisis Lifeline: 988 call the [place] National Suicide Prevention Lifeline: (404) 268-6539 or TTY: 2892776973 TTY 760-061-6520) to talk to a trained counselor call 1-800-273-TALK (toll free, 24 hour hotline)   Following is a copy of the CCM Program Consent:  CCM service includes personalized support from designated clinical staff supervised by the physician, including individualized plan of care and coordination with other care providers 24/7 contact phone numbers for assistance for urgent and routine care needs. Service will only be billed when office clinical staff spend 20 minutes or more in a month to coordinate care. Only one practitioner may furnish and bill the service in a calendar month. The patient may stop CCM services at amy time (effective at the end of the month) by phone call to the office staff. The patient will be responsible for cost sharing (co-pay) or up to 20% of the service fee (after annual deductible is met)  Following is a copy of your full provider care plan:   Goals Addressed             This Visit's Progress    CCM Expected Outcome:  Monitor, Self-Manage and Reduce Symptoms of Diabetes       Current Barriers:  Knowledge Deficits related to how often to check blood sugars and the goal of A1C levels Care Coordination needs related to the need for a new motorized chair and asking for recommendations for exercise like water aerobics in a patient with DM Chronic Disease Management support and education needs related to effective management of DM Lab Results  Component Value Date   HGBA1C 6.7 (H) 10/05/2022     Planned Interventions: Provided education to patient about basic DM disease process. Education on the goal of A1C. The patient is  right at goal. Education provided on monitoring for blood sugar changes and what the A1C test was and the goal of keeping A1C <7.0 ; Reviewed medications with patient and discussed importance of medication adherence. The patient is compliant with medications. His daughter assist him with managing his medications. ;        Reviewed prescribed diet with patient heart healthy/ADA diet. The patient is eating well. Review with the patients other daughter the importance of monitoring for changes in dietary habits and watching fats, salts, and sweets. Counseled on importance of regular laboratory monitoring as prescribed. Trend down in A1C, education provided. ;        Discussed plans with patient for ongoing care management follow up and provided patient with direct contact information for care management team;      Provided patient with written educational materials related to hypo and hyperglycemia and importance of correct treatment;       Reviewed scheduled/upcoming provider appointments including: 01-10-2023 at 940 am- reminder provided today;         Advised patient, providing education and rationale, to check cbg when you have symptoms of low or high blood sugar and as directed by the pcp  and record. Per the patients daughter Melody they have no way of checking blood sugars. The patient is at goal. Did advise if she felt this is something that would be beneficial for him that Dr. Laural Benes could write an order for a meter. Will continue to monitor for changes.  call provider for findings outside established parameters;       Referral made to social work team for assistance with support and education for expressed needs. Has talked with the LCSW and knows the support of social work for expressed needs Referral made to community resources care guide team for assistance with has talked with the care guides for assistance with food resources and other expressed needs. 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 Wears glasses but cannot see out of them well. States he needs a new eye exam and she will look into this. DME needs: The patients daughter stats that since the patient can ambulate it is harder to get a replacement chair because insurance will not pay. The patient is working with PT and his daughters encourage him to walk.  Also provided the patients daughter with the number to: [employer]: [phone] for additional help with equipment needs as they help patients get needed medical equipment.  She states she needs a wider shower chair for the patient.   Symptom Management: Take medications as prescribed   Attend all scheduled provider appointments Call provider office for new concerns or questions  call the Suicide and Crisis Lifeline: 988 call the [place] National Suicide Prevention Lifeline: 5133506159 or TTY: 508-695-6961 TTY 571-329-6444) to talk to a trained counselor call 1-800-273-TALK (toll free, 24 hour hotline) if experiencing a Mental Health or Behavioral Health Crisis  schedule appointment with eye doctor check blood sugar at prescribed times: when you have symptoms of low or high blood sugar and as ordered by the pcp  check feet daily for cuts, sores or redness trim toenails straight across wash and dry feet carefully every day wear comfortable, cotton socks wear comfortable, well-fitting shoes  Follow Up Plan: Telephone follow up appointment with care management team member scheduled for: 12-07-2022 at 230 pm       CCM Expected Outcome:  Monitor, Self-Manage and Reduce  Symptoms of Heart Failure       Current Barriers:  Knowledge Deficits related to seeing cardiologist and other specialist on a regular basis to effectively manage HF and other chronic heart conditions.  Care Coordination needs related to medication needs and other expressed needs involving interdisciplinary team involvement in a patient with HF and other chronic conditions Chronic Disease Management support and education needs related to effective management of HF and other chronic heart conditions Wt Readings from Last 3 Encounters:  10/18/22 260 lb (117.9 kg)  10/05/22 256 lb 3.2 oz (116.2 kg)  09/28/22 260 lb 9.6 oz (118.2 kg)     Planned Interventions: Basic overview and discussion of pathophysiology of Heart Failure reviewed. The patient is going to have an ICD placed the end of the month for help with managing his AFIB. Education provided to his daughter today and will attach information to the AVS for patient education. Encouraged the daughter to write down questions to ask the cardiologist and that the patient may need to hold some of his medications before the procedure.  Provided education on low sodium diet. Education provided on heart healthy/ADA diet and will send information in the mail for planning healthy meals. Reviewed Heart Failure Action Plan in depth, review of monitoring for swelling and edema in bilateral legs  Assessed need for readable accurate scales in home Provided education about placing scale on hard, flat surface Advised patient to weigh each morning after emptying bladder Discussed importance of daily weight and advised patient to weigh and record daily Reviewed role of diuretics in prevention of fluid overload and management of heart failure. Review of medications and the patients daughter states compliance with medications. She helps the patient manage his medications Discussed the importance of keeping all appointments with provider. 07-29-2022 at 420 pm with the  pcp, reminder given today. Provided patient with education about the role of exercise in the management of heart failure. The patients daughter is interested in the patient doing water aerobics as this has helped her with walking and balance.  Referral made to community resources care guide team for assistance with the patients daughter has spoke to the LCSW and the care guide team about resources to help with food and other expressed needs Advised patient to discuss changes in heart failure, questions, and concerns with provider Screening for signs and symptoms of depression related to chronic disease state  Assessed social determinant of health barriers The patients daughter states that he has stopped smoking and is using patches. They have also convinced him to decrease his alcohol intake. She states that they also are trying to get him to drink more water to stay hydrated.   Symptom Management: Take medications as prescribed   Attend all scheduled provider appointments Call provider office for new concerns or questions  call the Suicide and Crisis Lifeline: 988 call the [place] National Suicide Prevention Lifeline: 854-185-3469 or TTY: 289-556-7750 TTY (301)546-7044) to talk to a trained counselor call 1-800-273-TALK (toll free, 24 hour hotline) if experiencing a Mental Health or Behavioral Health Crisis  call office if I gain more than 2 pounds in one day or 5 pounds in one week track weight in diary use salt in moderation watch for swelling in feet, ankles and legs every day weigh myself daily develop a rescue plan follow rescue plan if symptoms flare-up track symptoms and what helps feel better or worse dress right for the weather, hot or cold  Follow Up Plan: Telephone follow up appointment with care management team member scheduled for: 12-07-2022 at 230 pm          Patient verbalizes understanding of instructions and care plan provided today and agrees to view in MyChart.  Active MyChart status and patient understanding of how to access instructions and care plan via MyChart confirmed with patient.  Telephone follow up appointment with care management team member scheduled for: 12-07-2022 at 230 pm  Cardioverter Defibrillator Implantation, Care After The following information offers guidance on how to care for yourself after your procedure. Your health care provider may also give you more specific instructions. If you have problems or questions, contact your health care provider. What can I expect after the procedure? After the procedure, it is common to have: Some pain. Pain may last a few days. A slight bump over the skin where the device was placed. Sometimes, it is possible to feel the device under the skin. This is normal. During the months and years after your procedure, your health care provider will check the device, the wires (leads), and the battery every  few months. The generator is monitored over time and will be replaced when the battery is depleted. Follow these instructions at home: Medicines Take over-the-counter and prescription medicines only as told by your health care provider. If you were prescribed an antibiotic medicine, take it as told by your health care provider. Do not stop taking the antibiotic even if you start to feel better. Bathing Do not take baths, swim, or use a hot tub until your health care provider approves. Ask your health care provider when you may take showers. You may only be allowed to take sponge baths. Cover your incision with a watertight covering if you take a sponge bath. Incision care  Follow instructions from your health care provider about how to take care of your incision. Make sure you: Wash your hands with soap and water for at least 20 seconds before and after you change your bandage (dressing). If soap and water are not available, use hand sanitizer. Change your dressing as told by your health care  provider. Leave stitches (sutures), skin glue, adhesive strips, or staples in place. These skin closures may need to stay in place for 2 weeks or longer. If adhesive strip edges start to loosen and curl up, you may trim the loose edges. Do not remove adhesive strips completely unless your health care provider tells you to do that. Check your incision area every day for signs of infection. Check for: Redness, swelling, or more pain. Fluid or blood. Warmth. Pus or a bad smell. Do not use lotions or ointments near the incision area unless told by your health care provider. Do not wear tight clothes or clothes that could rub on your incision. Activity  Do not lift anything that is heavier than 10 lb (4.5 kg), or the limit that you are told, until your health care provider says that it is safe. Return to your normal activities as told by your health care provider. Ask your health care provider what activities are safe for you. Follow instructions from your health care provider about sports, exercise, work, and sexual activity restrictions after your procedure. Avoid sitting for a long time without moving. Get up to take short walks every 1-2 hours. This is important to improve blood flow and breathing. Ask for help if you feel weak or unsteady. For at least 6 weeks: Do not lift your upper arm above your shoulders. You may be given a sling to use. This means no tennis, golf, or swimming for this period of time. If you tend to sleep with your arm above your head, use a restraint, such as a sling, to prevent movement during sleep. You should use your shoulder on the side of the defibrillator in daily tasks that do not require a lot of motion. Avoid sudden jerking, pulling, or chopping movements that pull your upper arm far away from your body. Do not drive or operate machinery until your health care provider says that it is safe. Participate in a cardiac rehabilitation program as told by your health care  provider. A cardiac rehabilitation program is a treatment program to improve your health and well-being through exercise training, education, and counseling. Electric and magnetic fields Tell all health care providers, including your dentist, that you have a defibrillator. Some medical devices and imaging methods such as MRI may affect your device. If you must pass through a metal detector, quickly walk through it. Do not stop under the detector. Do not stand near it. Avoid places or objects that  have a strong electric or magnetic field, including: Scientist, physiological. At the airport, let officials know that you have a defibrillator. Your defibrillator ID card will let you be checked in a way that is safe for you and will not damage your defibrillator. Do not let a security person wave a magnetic wand near your defibrillator. That can make it stop working. Power plants. Large electrical generators. Anti-theft systems or electronic article surveillance (EAS). Radiofrequency transmission towers, such as mobile phone and radio towers. Some household devices and appliances may produce electromagnetic waves that affect your defibrillator. If you are not sure if something is safe to use, ask your health care provider. When using your mobile phone, hold it to the ear that is on the opposite side from the defibrillator. Do not leave your mobile phone in a pocket over the defibrillator. Many devices contain magnets. Examples include headphones and electronic cigarettes. Do not put them in a pocket near your device. If they are wearable, wear them as far away from the device as possible. Some devices are safe to use if they are held at least 12 inches (30 cm) away from your defibrillator. These include power tools, chain saws, lawn mowers, and speakers. You may safely use electric blankets, heating pads, computers, and microwave ovens. Do not use amateur or ham radio equipment, electric (arc) welding  torches, magnetic mattresses or chairs, and body fat measuring scales. General instructions Always keep your defibrillator ID card with you. The card should list the implant date, device model, and manufacturer. Consider wearing a medical alert bracelet or necklace. Have your defibrillator checked as often as told by your health care provider. Most defibrillators last for 5-7 years. Do not use any products that contain nicotine or tobacco. These products include cigarettes, chewing tobacco, and vaping devices, such as e-cigarettes. If you need help quitting, ask your health care provider. Discuss specific device restrictions with your device manufacturer or health care provider. Get screened for depression and anxiety, and seek treatment if needed. Keep all follow-up visits. This is important. Contact a health care provider if: You feel one shock in your chest. You gain weight suddenly. You have a fever. You have swelling in your arm on the same side of your body as the device or have swelling in your legs or feet. It feels like your heart is fluttering or skipping beats (heart palpitations). You have any of these signs of infection around your incision: Redness, swelling, or more pain. Fluid or blood. Warmth. Pus or a bad smell. You are feeling depressed, scared, anxious, or sad. Get help right away if: You have chest pain. You feel more than one shock. You have problems breathing or have shortness of breath. You have dizziness or fainting. Your incision starts to open up. These symptoms may represent a serious problem that is an emergency. Do not wait to see if the symptoms will go away. Get medical help right away. Call your local emergency services (911 in the [place] Do not drive yourself to the hospital. Summary It is important to follow your activity restrictions for at least 6 weeks following your device implantation. Do not lift your upper arm above your shoulder. Always keep  your defibrillator ID card with you. The card should list the implant date, device model, and manufacturer. Consider wearing a medical alert bracelet or necklace. Follow the device distance restrictions for electric and magnetic fields. Keep all follow-up visits with your health care provider for monitoring of your  defibrillator. Contact your health care provider if you have signs of an infection such as a fever or redness, swelling, or more pain around your incision. Get help right away if you have chest pain, receive more than one shock, or faint. This information is not intended to replace advice given to you by your health care provider. Make sure you discuss any questions you have with your health care provider. Document Revised: 11/21/2019 Document Reviewed: 11/21/2019 Elsevier Patient Education  2023 [employer].

## 2022-10-28 ENCOUNTER — Telehealth: Payer: Self-pay | Admitting: Cardiovascular Disease

## 2022-10-28 NOTE — Telephone Encounter (Signed)
Daughter wants to know which type of CHG surgical scrub she should use for upcoming procedure.

## 2022-10-28 NOTE — Telephone Encounter (Signed)
Spoke with daughter and she inquired about the scrub. She wanted to know if this was some sort of antibacterial soap. Advised that they gave him a bottle of the surgical scrub at his visit. She stated he didn't know what that was for. Reviewed the scrub information and she was appreciative for the call back. She verbalized understanding of our conversation with no further questions at this time.

## 2022-10-28 NOTE — Telephone Encounter (Signed)
Daughter called to follow-up on patient's instructions for upcoming procedure.

## 2022-10-28 NOTE — Telephone Encounter (Signed)
Spoke with daughter and reviewed that instructions are available through my chart under letters. She pulled it up and was very appreciative for the review and information with no further questions at this time.

## 2022-11-01 ENCOUNTER — Other Ambulatory Visit: Payer: Self-pay | Admitting: Cardiology

## 2022-11-01 ENCOUNTER — Other Ambulatory Visit: Payer: Self-pay | Admitting: Family Medicine

## 2022-11-01 DIAGNOSIS — I429 Cardiomyopathy, unspecified: Secondary | ICD-10-CM

## 2022-11-02 NOTE — Telephone Encounter (Signed)
Unable to refill per protocol, Rx expired. Discontinued 06/26/22.  Requested Prescriptions  Pending Prescriptions Disp Refills   metFORMIN (GLUCOPHAGE-XR) 500 MG 24 hr tablet [Pharmacy Med Name: METFORMIN HCL ER 500 MG TABLET] 60 tablet 0    Sig: TAKE 1 TABLET BY MOUTH IN THE MORNING AND AT BEDTIME. OFFICE VISIT NEEDED FOR ADDITIONAL REFILLS     Endocrinology:  Diabetes - Biguanides Failed - 11/01/2022  2:31 AM      Failed - Cr in normal range and within 360 days    Creatinine, Ser  Date Value Ref Range Status  10/18/2022 1.32 (H) 0.61 - 1.24 mg/dL Final         Failed - eGFR in normal range and within 360 days    GFR calc Af Amer  Date Value Ref Range Status  03/28/2020 96 >59 mL/min/1.73 Final    Comment:    **In accordance with recommendations from the NKF-ASN Task force,**   [facility] is in the process of updating its eGFR calculation to the   2021 CKD-EPI creatinine equation that estimates kidney function   without a race variable.    GFR, Estimated  Date Value Ref Range Status  10/18/2022 58 (L) >60 mL/min Final    Comment:    (NOTE) Calculated using the CKD-EPI Creatinine Equation (2021)    eGFR  Date Value Ref Range Status  06/29/2022 93 >59 mL/min/1.73 Final         Passed - HBA1C is between 0 and 7.9 and within 180 days    HB A1C (BAYER DCA - WAIVED)  Date Value Ref Range Status  10/05/2022 6.7 (H) 4.8 - 5.6 % Final    Comment:             Prediabetes: 5.7 - 6.4          Diabetes: >6.4          Glycemic control for adults with diabetes: <7.0          Passed - B12 Level in normal range and within 720 days    Vitamin B-12  Date Value Ref Range Status  06/18/2021 304 232 - 1,245 pg/mL Final         Passed - Valid encounter within last 6 months    Recent Outpatient Visits           4 weeks ago Diabetes mellitus with cardiac complication [facility])   [place] [facility] [place], Megan P, DO   2 months ago Post-traumatic osteoarthritis of  right knee   [place] [facility] [place], Megan P, DO   2 months ago Encounter for annual wellness exam in Medicare patient   [place] [facility] [place], Megan P, DO   4 months ago Acute gastritis, presence of bleeding unspecified, unspecified gastritis type   [place] [facility] [place], Megan P, DO   1 year ago Routine general medical examination at a health care facility   [facility] [place], [place] P, DO       Future Appointments             In 2 months Dorcas Carrow, DO  [facility]            Passed - CBC within normal limits and completed in the last 12 months    WBC  Date Value Ref Range Status  10/18/2022 4.8 4.0 - 10.5 K/uL Final   RBC  Date Value  Ref Range Status  10/18/2022 4.69 4.22 - 5.81 MIL/uL Final   Hemoglobin  Date Value Ref Range Status  10/18/2022 13.9 13.0 - 17.0 g/dL Final  16/03/9603 54.0 13.0 - 17.7 g/dL Final   HCT  Date Value Ref Range Status  10/18/2022 41.7 39.0 - 52.0 % Final   Hematocrit  Date Value Ref Range Status  06/29/2022 41.1 37.5 - 51.0 % Final   MCHC  Date Value Ref Range Status  10/18/2022 33.3 30.0 - 36.0 g/dL Final   [facility]  Date Value Ref Range Status  10/18/2022 29.6 26.0 - 34.0 pg Final   MCV  Date Value Ref Range Status  10/18/2022 88.9 80.0 - 100.0 fL Final  06/29/2022 85 79 - 97 fL Final   No results found for: "PLTCOUNTKUC", "LABPLAT", "POCPLA" RDW  Date Value Ref Range Status  10/18/2022 16.6 (H) 11.5 - 15.5 % Final  06/29/2022 14.1 11.6 - 15.4 % Final

## 2022-11-03 ENCOUNTER — Other Ambulatory Visit: Payer: Self-pay

## 2022-11-03 ENCOUNTER — Ambulatory Visit: Payer: Medicare HMO

## 2022-11-03 ENCOUNTER — Encounter
Admission: RE | Disposition: A | Discharge status: Home / Self Care | Payer: Self-pay | Source: Home / Self Care | Attending: Cardiology

## 2022-11-03 ENCOUNTER — Encounter: Payer: Self-pay | Admitting: Cardiology

## 2022-11-03 ENCOUNTER — Ambulatory Visit
Admission: RE | Admit: 2022-11-03 | Discharge: 2022-11-03 | Disposition: A | Discharge status: Home / Self Care | Payer: Medicare HMO | Attending: Cardiology | Admitting: Cardiology

## 2022-11-03 DIAGNOSIS — I2510 Atherosclerotic heart disease of native coronary artery without angina pectoris: Secondary | ICD-10-CM | POA: Diagnosis not present

## 2022-11-03 DIAGNOSIS — Z95810 Presence of automatic (implantable) cardiac defibrillator: Secondary | ICD-10-CM | POA: Diagnosis not present

## 2022-11-03 DIAGNOSIS — Z7901 Long term (current) use of anticoagulants: Secondary | ICD-10-CM | POA: Diagnosis not present

## 2022-11-03 DIAGNOSIS — I428 Other cardiomyopathies: Secondary | ICD-10-CM | POA: Insufficient documentation

## 2022-11-03 DIAGNOSIS — I4892 Unspecified atrial flutter: Secondary | ICD-10-CM | POA: Insufficient documentation

## 2022-11-03 DIAGNOSIS — I5022 Chronic systolic (congestive) heart failure: Secondary | ICD-10-CM

## 2022-11-03 DIAGNOSIS — Z79899 Other long term (current) drug therapy: Secondary | ICD-10-CM | POA: Insufficient documentation

## 2022-11-03 DIAGNOSIS — I110 Hypertensive heart disease with heart failure: Secondary | ICD-10-CM | POA: Diagnosis not present

## 2022-11-03 DIAGNOSIS — I4819 Other persistent atrial fibrillation: Secondary | ICD-10-CM | POA: Diagnosis not present

## 2022-11-03 HISTORY — PX: ICD IMPLANT: EP1208

## 2022-11-03 LAB — GLUCOSE, CAPILLARY: Glucose-Capillary: 101 mg/dL — ABNORMAL HIGH (ref 70–99)

## 2022-11-03 SURGERY — ICD IMPLANT
Anesthesia: Moderate Sedation

## 2022-11-03 MED ORDER — POVIDONE-IODINE 10 % EX SWAB: 2.0000 | Freq: Once | CUTANEOUS | Status: DC

## 2022-11-03 MED ORDER — CEFAZOLIN SODIUM-DEXTROSE 2-4 GM/100ML-% IV SOLN: 2.0000 g | INTRAVENOUS | Status: DC

## 2022-11-03 MED ORDER — APIXABAN 5 MG PO TABS: 5.0000 mg | ORAL_TABLET | Freq: Two times a day (BID) | ORAL | 0 refills | Status: DC

## 2022-11-03 MED ORDER — FENTANYL CITRATE (PF) 100 MCG/2ML IJ SOLN
INTRAMUSCULAR | Status: DC | PRN
  Administered 2022-11-03: 25 ug via INTRAVENOUS

## 2022-11-03 MED ORDER — SODIUM CHLORIDE 0.9 % IV SOLN
80.0000 mg | INTRAVENOUS | Status: DC
Start: 2022-11-03 — End: 2022-11-03

## 2022-11-03 MED ORDER — LIDOCAINE HCL 1 % IJ SOLN
INTRAMUSCULAR | Status: AC
  Filled 2022-11-03: qty 60

## 2022-11-03 MED ORDER — CEFAZOLIN SODIUM-DEXTROSE 2-4 GM/100ML-% IV SOLN
INTRAVENOUS | Status: AC
  Filled 2022-11-03: qty 100

## 2022-11-03 MED ORDER — HEPARIN (PORCINE) IN NACL 1000-0.9 UT/500ML-% IV SOLN
INTRAVENOUS | Status: DC | PRN
  Administered 2022-11-03: 500 mL

## 2022-11-03 MED ORDER — MIDAZOLAM HCL 2 MG/2ML IJ SOLN
INTRAMUSCULAR | Status: AC
  Filled 2022-11-03: qty 2

## 2022-11-03 MED ORDER — FENTANYL CITRATE (PF) 100 MCG/2ML IJ SOLN
INTRAMUSCULAR | Status: AC
  Filled 2022-11-03: qty 2

## 2022-11-03 MED ORDER — SODIUM CHLORIDE 0.9 % IV SOLN
INTRAVENOUS | Status: DC
Start: 2022-11-03 — End: 2022-11-03

## 2022-11-03 MED ORDER — MIDAZOLAM HCL 2 MG/2ML IJ SOLN
INTRAMUSCULAR | Status: DC | PRN
  Administered 2022-11-03: 1 mg via INTRAVENOUS

## 2022-11-03 MED ORDER — LIDOCAINE HCL (PF) 1 % IJ SOLN
INTRAMUSCULAR | Status: DC | PRN
  Administered 2022-11-03: 30 mL

## 2022-11-03 MED ORDER — CHLORHEXIDINE GLUCONATE CLOTH 2 % EX PADS
6.0000 | MEDICATED_PAD | Freq: Every day | CUTANEOUS | Status: DC
  Administered 2022-11-03: 6 via TOPICAL

## 2022-11-03 MED ORDER — CHLORHEXIDINE GLUCONATE 4 % EX SOLN: 4.0000 | Freq: Once | CUTANEOUS | Status: DC

## 2022-11-03 MED ORDER — CEFAZOLIN SODIUM-DEXTROSE 2-4 GM/100ML-% IV SOLN
2.0000 g | INTRAVENOUS | Status: AC
  Administered 2022-11-03: 2 g via INTRAVENOUS

## 2022-11-03 MED ORDER — LIDOCAINE HCL 1 % IJ SOLN
INTRAMUSCULAR | Status: AC
  Filled 2022-11-03: qty 20

## 2022-11-03 MED ORDER — ACETAMINOPHEN 325 MG PO TABS
325.0000 mg | ORAL_TABLET | ORAL | Status: DC | PRN
  Administered 2022-11-03: 650 mg via ORAL

## 2022-11-03 MED ORDER — ACETAMINOPHEN 325 MG PO TABS
ORAL_TABLET | ORAL | Status: AC
  Filled 2022-11-03: qty 2

## 2022-11-03 MED ORDER — SODIUM CHLORIDE 0.9 % IV SOLN: INTRAVENOUS | Status: DC

## 2022-11-03 MED ORDER — ONDANSETRON HCL 4 MG/2ML IJ SOLN: 4.0000 mg | Freq: Four times a day (QID) | INTRAMUSCULAR | Status: DC | PRN

## 2022-11-03 MED ORDER — HEPARIN (PORCINE) IN NACL 1000-0.9 UT/500ML-% IV SOLN
INTRAVENOUS | Status: AC
  Filled 2022-11-03: qty 1000

## 2022-11-03 MED ORDER — SODIUM CHLORIDE 0.9 % IV SOLN
80.0000 mg | INTRAVENOUS | Status: DC
  Administered 2022-11-03: 80 mg
  Filled 2022-11-03: qty 2

## 2022-11-03 SURGICAL SUPPLY — 14 items
CABLE SURG 12 DISP A/V CHANNEL (MISCELLANEOUS) IMPLANT
DEVICE DSSCT PLSMBLD 3.0S LGHT (MISCELLANEOUS) IMPLANT
ELECT REM PT RETURN 9FT ADLT (ELECTROSURGICAL) ×1
ELECTRODE REM PT RTRN 9FT ADLT (ELECTROSURGICAL) IMPLANT
ICD ACTICOR DX (ICD Generator) IMPLANT
LEAD PLEXA 65/15 (Lead) IMPLANT
PAD ELECT DEFIB RADIOL ZOLL (MISCELLANEOUS) IMPLANT
PLASMABLADE 3.0S W/LIGHT (MISCELLANEOUS) ×1
SHEATH 8FR PRELUDE SNAP 13 (SHEATH) IMPLANT
SLING ARM IMMOBILIZER LRG (SOFTGOODS) IMPLANT
SUT DVC VLOC 3-0 CL 6 P-12 (SUTURE) IMPLANT
SUT ETHIBOND CT1 BRD #0 30IN (SUTURE) IMPLANT
SUTURE VLOC 90 2-0 VIO GS21 (SUTURE) IMPLANT
TRAY PACEMAKER INSERTION (PACKS) ×1 IMPLANT

## 2022-11-03 NOTE — Discharge Instructions (Signed)
After Your ICD (Implantable Cardiac Defibrillator)   You have a Biotronik ICD  ACTIVITY Do not lift your arm above shoulder height for 1 week after your procedure. After 7 days, you may progress as below.  You should remove your sling 24 hours after your procedure, unless otherwise instructed by your provider.     Wednesday November 10, 2022  Thursday November 11, 2022 Friday November 12, 2022 Saturday November 13, 2022   Do not lift, push, pull, or carry anything over 10 pounds with the affected arm until 6 weeks (Wednesday December 15, 2022 ) after your procedure.   You may drive AFTER your wound check, unless you have been told otherwise by your provider.   Ask your healthcare provider when you can go back to work   INCISION/Dressing Resume Eliquis Tuesday, 6/4   If large square, outer bandage is left in place, this can be removed after 24 hours from your procedure. Do not remove steri-strips or glue as below.   Monitor your defibrillator site for redness, swelling, and drainage. Call the device clinic at [phone] if you experience these symptoms or fever/chills.  If your incision is sealed with Steri-strips or staples, you may shower 7 days after your procedure or when told by your provider. Do not remove the steri-strips or let the shower hit directly on your site. You may wash around your site with soap and water.    If you were discharged in a sling, please do not wear this during the day more than 48 hours after your surgery unless otherwise instructed. This may increase the risk of stiffness and soreness in your shoulder.   Avoid lotions, ointments, or perfumes over your incision until it is well-healed.  You may use a hot tub or a pool AFTER your wound check appointment if the incision is completely closed.  Your ICD is designed to protect you from life threatening heart rhythms. Because of this, you may receive a shock.   1 shock with no symptoms:  Call the office during business  hours. 1 shock with symptoms (chest pain, chest pressure, dizziness, lightheadedness, shortness of breath, overall feeling unwell):  Call 911. If you experience 2 or more shocks in 24 hours:  Call 911. If you receive a shock, you should not drive for 6 months per the [place] DMV IF you receive appropriate therapy from your ICD.   ICD Alerts:  Some alerts are vibratory and others beep. These are NOT emergencies. Please call our office to let us know. If this occurs at night or on weekends, it can wait until the next business day. Send a remote transmission.  If your device is capable of reading fluid status (for heart failure), you will be offered monthly monitoring to review this with you.   DEVICE MANAGEMENT Remote monitoring is used to monitor your ICD from home. This monitoring is scheduled every 91 days by our office. It allows [place] to keep an eye on the functioning of your device to ensure it is working properly. You will routinely see your Electrophysiologist annually (more often if necessary).   You should receive your ID card for your new device in 4-8 weeks. Keep this card with you at all times once received. Consider wearing a medical alert bracelet or necklace.  Your ICD  may be MRI compatible. This will be discussed at your next office visit/wound check.  You should avoid contact with strong electric or magnetic fields.   Do not use amateur (ham)  radio equipment or electric (arc) Optician, dispensing. MP3 player headphones with magnets should not be used. Some devices are safe to use if held at least 12 inches (30 cm) from your defibrillator. These include power tools, lawn mowers, and speakers. If you are unsure if something is safe to use, ask your health care provider.  When using your cell phone, hold it to the ear that is on the opposite side from the defibrillator. Do not leave your cell phone in a pocket over the defibrillator.  You may safely use electric blankets, heating pads,  computers, and microwave ovens.  Call the office right away if: You have chest pain. You feel more than one shock. You feel more short of breath than you have felt before. You feel more light-headed than you have felt before. Your incision starts to open up.  This information is not intended to replace advice given to you by your health care provider. Make sure you discuss any questions you have with your health care provider.

## 2022-11-03 NOTE — Progress Notes (Signed)
PT transported to X-ray. 

## 2022-11-03 NOTE — H&P (Signed)
Electrophysiology Office Note:     Date:  11/03/2022    ID:  Eugene Henderson, DOB [birth year], MRN [id number]   [facility] Cardiologist:  Lorine Bears, MD  [facility] Electrophysiologist:  Lanier Prude, MD    Referring MD: Dorcas Carrow, DO    Chief Complaint: Atrial fibrillation and heart failure   History of Present Illness:     Eugene Henderson is a 72 y.o. male who I am seeing today for an evaluation of atrial fibrillation and systolic heart failure at the request of Dr. Shirlee Latch.  The patient has a medical history including nonischemic cardiomyopathy, hypertension, diabetes, persistent atrial fibrillation, tobacco abuse.  He last saw Dr. Shirlee Latch July 02, 2022.  At that appointment he reported class II-III heart failure symptoms.   Today he reports feeling better with an improved exercise tolerance.  He is able to move about his house without too much difficulty.  No syncope or presyncope.  He is planning to have a knee replacement surgery but has not scheduled the initial consultation yet.      Presents for ICD implant. Doing well.   Objective  Their past medical, social and family history was reveiwed.     ROS:   Please see the history of present illness.    All other systems reviewed and are negative.   EKGs/Labs/Other Studies Reviewed:     The following studies were reviewed today:   August 04, 2022 cardiac MRI EF 17% Global hypokinesis Small LGE on the lateral wall . July 02, 2022 EKG shows sinus rhythm with a narrow QRS, 120 ms   EKG today shows atrial flutter with a ventricular rate of 83 bpm.     Physical Exam:     VS:  BP 129/86   Pulse 62   Ht 6' (1.829 m)   Wt 261 lb (118.4 kg)   SpO2 98%   BMI 35.40 kg/m         Wt Readings from Last 3 Encounters:  08/18/22 261 lb (118.4 kg)  08/02/22 259 lb (117.5 kg)  07/23/22 261 lb 6 oz (118.6 kg)      GEN:  Well nourished, well developed in no acute distress.  Obese CARDIAC: RRR, no  murmurs, rubs, gallops RESPIRATORY:  Clear to auscultation without rales, wheezing or rhonchi          Assessment ASSESSMENT AND PLAN:     1. Persistent atrial fibrillation (HCC)   2. Chronic systolic heart failure (HCC)   3. Encounter for long-term (current) use of high-risk medication         #Persistent atrial fibrillation and flutter, possibly permanent #High risk med monitoring-amiodarone Is back out of rhythm despite treatment with amiodarone.  I would consider his atrial flutter is permanent at this time.  I suspect some of his improved symptoms are due to improved rate control.   Currently taking amiodarone 200 mg by mouth once daily.  Would continue this and then plan for repeat cardioversion in 6 or 8 weeks. On Eliquis for stroke prophylaxis Liver and thyroid function checked in January and were stable.   Before considering any ablative option for his arrhythmia, would need to document that he can maintain normal rhythm on amiodarone.  I suspect he may be approaching permanent atrial fibrillation/flutter.   #Chronic systolic heart failure NYHA class III.  Warm and dry on exam.  Follows with Dr. Shirlee Latch in the heart failure clinic.  EF 17%.   The patient has an non  ischemic CM (EF 17%), NYHA Class III CHF, and CAD.  He is referred by Dr Shirlee Latch for risk stratification of sudden death and consideration of ICD implantation.  At this time, he meets criteria for ICD implantation for primary prevention of sudden death.  I have had a thorough discussion with the patient reviewing options.  The patient and their family (if available) have had opportunities to ask questions and have them answered.    Risks, benefits, alternatives to ICD implantation were discussed in detail with the patient today. The patient understands that the risks include but are not limited to bleeding, infection, pneumothorax, perforation, tamponade, vascular damage, renal failure, MI, stroke, death, inappropriate  shocks, and lead dislodgement.   Presents for ICD implant. Procedure reviewed. He has held his OAC For 48 hours.      Signed, Rossie Muskrat. Lalla Brothers, MD, [facility], [facility] 11/03/2022 Electrophysiology [facility]

## 2022-11-04 ENCOUNTER — Encounter: Payer: Self-pay | Admitting: Cardiology

## 2022-11-05 ENCOUNTER — Ambulatory Visit (HOSPITAL_BASED_OUTPATIENT_CLINIC_OR_DEPARTMENT_OTHER): Payer: Medicare HMO | Admitting: Cardiology

## 2022-11-05 ENCOUNTER — Other Ambulatory Visit
Admission: RE | Admit: 2022-11-05 | Discharge: 2022-11-05 | Disposition: A | Discharge status: Home / Self Care | Payer: Medicare HMO | Source: Ambulatory Visit | Attending: Cardiology | Admitting: Cardiology

## 2022-11-05 ENCOUNTER — Telehealth: Payer: Self-pay

## 2022-11-05 VITALS — BP 133/77 | HR 86 | Ht 73.0 in | Wt 262.0 lb

## 2022-11-05 DIAGNOSIS — I5022 Chronic systolic (congestive) heart failure: Secondary | ICD-10-CM | POA: Insufficient documentation

## 2022-11-05 DIAGNOSIS — Z7984 Long term (current) use of oral hypoglycemic drugs: Secondary | ICD-10-CM | POA: Diagnosis not present

## 2022-11-05 DIAGNOSIS — I509 Heart failure, unspecified: Secondary | ICD-10-CM | POA: Diagnosis not present

## 2022-11-05 DIAGNOSIS — I5020 Unspecified systolic (congestive) heart failure: Secondary | ICD-10-CM | POA: Diagnosis not present

## 2022-11-05 DIAGNOSIS — J449 Chronic obstructive pulmonary disease, unspecified: Secondary | ICD-10-CM | POA: Diagnosis not present

## 2022-11-05 DIAGNOSIS — E1159 Type 2 diabetes mellitus with other circulatory complications: Secondary | ICD-10-CM | POA: Diagnosis not present

## 2022-11-05 LAB — BASIC METABOLIC PANEL
Anion gap: 9 (ref 5–15)
BUN: 18 mg/dL (ref 8–23)
CO2: 26 mmol/L (ref 22–32)
Calcium: 9 mg/dL (ref 8.9–10.3)
Chloride: 103 mmol/L (ref 98–111)
Creatinine, Ser: 0.9 mg/dL (ref 0.61–1.24)
GFR, Estimated: >60 mL/min (ref >60)
Glucose, Bld: 97 mg/dL (ref 70–99)
Potassium: 4.5 mmol/L (ref 3.5–5.1)
Sodium: 138 mmol/L (ref 135–145)

## 2022-11-05 LAB — DIGOXIN LEVEL: Digoxin Level: 0.3 ng/mL — ABNORMAL LOW (ref 0.8–2.0)

## 2022-11-05 LAB — BRAIN NATRIURETIC PEPTIDE: B Natriuretic Peptide: 173.7 pg/mL — ABNORMAL HIGH (ref 0.0–100.0)

## 2022-11-05 MED ORDER — METOPROLOL SUCCINATE ER 50 MG PO TB24: 75.0000 mg | ORAL_TABLET | Freq: Every day | ORAL | 1 refills | Status: DC

## 2022-11-05 NOTE — Telephone Encounter (Signed)
-----   Message from Noralee Space, RN sent at 11/05/2022 11:46 AM EDT ----- Eugene Henderson, can you guys please call him to review his post icd instructions, he was asking a lot of questions about the dressing and sling, thanks

## 2022-11-05 NOTE — Telephone Encounter (Signed)
LVM for patient to call device clinic back. Direct number to device clinic given

## 2022-11-05 NOTE — Patient Instructions (Signed)
Medication Changes:  STOP Amiodarone  Increase Metoprolol XL to 75 mg (1 & 1/2 tabs) Daily  Lab Work:  Labs done today, your results will be available in MyChart, we will contact you for abnormal readings.   Testing/Procedures:  Your physician has recommended that you have a cardiopulmonary stress test (CPX). CPX testing is a non-invasive measurement of heart and lung function. It replaces a traditional treadmill stress test. This type of test provides a tremendous amount of information that relates not only to your present condition but also for future outcomes. This test combines measurements of you ventilation, respiratory gas exchange in the lungs, electrocardiogram (EKG), blood pressure and physical response before, during, and following an exercise protocol. SEE INSTRUCTIONS BELOW   Referrals:  Keep appointment with [facility]  Special Instructions // Education:  Do the following things EVERYDAY: Weigh yourself in the morning before breakfast. Write it down and keep it in a log. Take your medicines as prescribed Eat low salt foods--Limit salt (sodium) to 2000 mg per day.  Stay as active as you can everyday Limit all fluids for the day to less than 2 liters  You are scheduled for a Cardiopulmonary Exercise (CPX) Test as [facility] on: Date: Tuesday 11/30/22     Time: 11 am  Expect to be in the lab for 2 hours. Please plan to arrive 30 minutes prior to your appointment. You may be asked to reschedule your test if you arrive 20 minutes or more after your scheduled appointment time.  Main Campus address: [address] [place], [place] [address] You may arrive to the Main Entrance A or Entrance C (free valet parking is available at both). -Main Entrance A (on [address]) :proceed to admitting for check in -Entrance C (on [facility]): proceed to [employer] parking or under hospital deck parking using this code _________  Check In: [facility] waiting  room (1st floor)  General Instructions for the day of the test (Please follow all instructions from your physician): Refrain from ingesting a heavy meal, alcohol, or caffeine or using tobacco products within 2 hours of the test (DO NOT FAST for mare than 8 hours). You may have all other non-alcoholic, non -caffeinated beverage,a light snack (crackers,a piece of fruit, carrot sticks, toast bagel,etc) up to your appointment. Avoid significant exertion or exercise within 24 hours of your test. Be prepared to exercise and sweat. Your clothing should permit freedom of movement and include walking or running shoes. Women bring loose fitting short sleeved blouse.  This evaluation may be fatiguing and you may wish ti have someone accompany you to the assessment to drive you home afterward. Bring a list of your medications with you, including dosage and frequency you take the medications (  I.e.,once per day, twice per day, etc). Take all medications as prescribed, unless noted below or instructed to do so by your physician.   Brief description of the test: A brief lung test will be performed. This will involve you taking deep breaths and blowing hard and fast through your mouth. During these , a clip will be on your nose and you will be breathing through a breathing device.   For the exercise portion of the test you will be walking on a treadmill, or riding a stationary bike, to your maximal effor or until symptoms such as chest pain, shortness of breath, leg pain or dizziness limit your exercise. You will be breathing in and out of a breathing device  through your mouth (a clip will be on your nose again). Your heart rate, ECG, blood pressure, oxygen saturations, breathing rate and depth, amount of oxygen you consume and amount of carbon dioxide you produce will be measured and monitored throughout the exercise test.  If you need to cancel or reschedule your appointment please call [phone] If you have  further questions please call your physician or Philip Aspen, MS, ACSM-RCEP at [phone]   Follow-Up in: 6 weeks    If you have any questions or concerns before your next appointment please send [place] a message through mychart or call our office at [phone] Monday-Friday 8 am-5 pm.   If you have an urgent need after hours on the weekend please call your Primary Cardiologist or the Advanced Heart Failure Clinic in [place] at [phone].

## 2022-11-07 NOTE — Progress Notes (Signed)
PCP: Dorcas Carrow, DO  Vilas Henderson is a 72 y.o. male with nonischemic cardiomyopathy based on 2018 cath, HTN, T2DM, HFrEF, long standing persistent atrial fibrillation, hx of tobacco use.  He was admitted on 06/14/22 with complains of chest pain and shortness of breath. On admission, patient noted to be in atrial fibrillation w/ RVR.  He was diuresed and underwent DCCV to NSR.  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  Echo in 1/24 showed EF 20-25% with severe RV dysfunction. Cardiac MRI was done in 2/24, showing LV EF 17%, RV EF 36%, small area of lateral wall subendocardial LGE, mid-wall LGE at the inferoseptal RV insertion site (nonspecific). There was a very small area of possible prior MI, but this study suggests primarily nonischemic cardiomyopathy.    RHC in 4/24 showed normal filling pressures with mildly decreased CI at 2.2.  He failed DCCV on amiodarone in 4/24.  Biotronik ICD was placed in 5/24.   Patient returns for followup of CHF.  He remains in atrial fibrillation.  He has not taken his morning meds yet.  Occasional lightheadedness with standing, not significant. Chronic orthopnea, sleeps propped up.  No dyspnea walking in house. He is short of breath carrying heavy loads.  No chest pain. Still smokes occasional black and milds.   ECG: Coarse atrial fibrillation vs atypical flutter, poor RWP  REDS clip 34%  Labs (1/24): K 4.3, creatinine 0.86, LDL 30 Labs (2/24): K 3, creatinine 0.89 Labs (4/24): LFTs normal, TSH normal Labs (5/24): K 4.3, creatinine 1.32  PMH: 1. Chronic systolic CHF: Nonischemic cardiomyopathy.   - Echo (2018): EF 25-30% - RHC/LHC (2018): mean RA 12, PA 37/12, mean PCWP 12, CI 1.77; no significant CAD.  - Echo (1/24): EF 20-25%, severely decreased RV systolic function.  - Cardiac MRI (2/24): LV EF 17%, RV EF 36%, small area  of lateral wall subendocardial LGE, mid-wall LGE at the inferoseptal RV insertion site (nonspecific).  - RHC (4/24): mean RA 11, PA 40/16 mean 29, mean PCWP 13, CI 2.22, PAPi 2.2, PVR 3 WU 2. Ascending aortic aneurysm - 4.7 cm ascending aorta on CTA in 1/24.  3. HTN 4. H/o hyperthyroidism 5. Atrial fibrillation: Persistent.  - 1/24 DCCV to NSR.  - Failed DCCV 4/24.  6. Type 2 diabetes 7. 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  8. COPD: Quit smoking in 2024.  - PFTs (2/24): Severe obstruction, +restriction.   Social History   Socioeconomic History   Marital status: Single    Spouse name: Not on file   Number of children: Not on file   Years of education: Not on file   Highest education level: Not on file  Occupational History   Occupation: retired  Tobacco Use   Smoking status: Former    Types: Pipe, Cigarettes    Quit date: 08/30/2022    Years since quitting: 0.1   Smokeless tobacco: Never  Vaping Use   Vaping Use: Never used  Substance and Sexual Activity   Alcohol use: Yes    Alcohol/week: 2.0 standard drinks of alcohol    Types: 2 Cans of beer per week    Comment: 2 40oz beers/week   Drug use: No   Sexual activity: Not Currently  Other Topics Concern  Not on file  Social History Narrative   Not on file   Social Determinants of Health   Financial Resource Strain: Low Risk  (07/27/2022)   Overall Financial Resource Strain (CARDIA)    Difficulty of Paying Living Expenses: Not very hard  Food Insecurity: No Food Insecurity (07/27/2022)   Hunger Vital Sign    Worried About Running Out of Food in the Last Year: Never true    Ran Out of Food in the Last Year: Never true  Transportation Needs: No Transportation Needs (07/27/2022)   PRAPARE - Administrator, Civil Service (Medical): No    Lack of Transportation (Non-Medical): No  Recent  Concern: Transportation Needs - Unmet Transportation Needs (07/12/2022)   PRAPARE - Administrator, Civil Service (Medical): Yes    Lack of Transportation (Non-Medical): No  Physical Activity: Inactive (07/27/2022)   Exercise Vital Sign    Days of Exercise per Week: 0 days    Minutes of Exercise per Session: 0 min  Stress: No Stress Concern Present (07/27/2022)   [employer] of Occupational Health - Occupational Stress Questionnaire    Feeling of Stress : Not at all  Social Connections: Socially Isolated (07/27/2022)   Social Connection and Isolation Panel [NHANES]    Frequency of Communication with Friends and Family: More than three times a week    Frequency of Social Gatherings with Friends and Family: More than three times a week    Attends Religious Services: Never    Database administrator or Organizations: No    Attends Banker Meetings: Never    Marital Status: Divorced  Catering manager Violence: Not At Risk (07/27/2022)   Humiliation, Afraid, Rape, and Kick questionnaire    Fear of Current or Ex-Partner: No    Emotionally Abused: No    Physically Abused: No    Sexually Abused: No   Family History  Problem Relation Age of Onset   Diabetes Mother    Diabetes Father    Heart disease Brother    Heart attack Brother    Diabetes Brother    Kidney failure Brother    Thyroid disease Neg Hx    ROS: All systems reviewed and negative except as per HPI.   Current Outpatient Medications  Medication Sig Dispense Refill   acetaminophen (TYLENOL) 500 MG tablet Take 500-1,000 mg by mouth every 6 (six) hours as needed for mild pain, fever or headache.      albuterol (VENTOLIN HFA) 108 (90 Base) MCG/ACT inhaler TAKE 2 PUFFS BY MOUTH EVERY 6 HOURS AS NEEDED FOR WHEEZE OR SHORTNESS OF BREATH 6.7 each 3   atorvastatin (LIPITOR) 40 MG tablet TAKE 1 TABLET BY MOUTH EVERY DAY 90 tablet 0   digoxin (LANOXIN) 0.125 MG tablet Take 0.5 tablet daily. (0.0625 mg) 45  tablet 3   ENTRESTO 49-51 MG TAKE 1 TABLET BY MOUTH TWICE A DAY 60 tablet 3   folic acid (FOLVITE) 1 MG tablet TAKE 1 TABLET BY MOUTH EVERY DAY 90 tablet 2   ibuprofen (ADVIL) 200 MG tablet Take 800 mg by mouth every 6 (six) hours as needed for moderate pain.     JARDIANCE 10 MG TABS tablet TAKE 1 TABLET BY MOUTH DAILY BEFORE BREAKFAST. 30 tablet 5   metFORMIN (GLUCOPHAGE) 500 MG tablet Take 1 tablet (500 mg total) by mouth 2 (two) times daily with a meal. 180 tablet 1   pantoprazole (PROTONIX) 40 MG tablet TAKE 1 TABLET BY MOUTH TWICE  A DAY 180 tablet 0   potassium chloride SA (KLOR-CON M) 20 MEQ tablet Take 20 mEq by mouth 2 (two) times daily.     prednisoLONE acetate (PRED FORTE) 1 % ophthalmic suspension 1 drop 4 (four) times daily. 5 mL 0   spironolactone (ALDACTONE) 25 MG tablet TAKE 1 TABLET (25 MG TOTAL) BY MOUTH DAILY. 90 tablet 0   Tiotropium Bromide-Olodaterol (STIOLTO RESPIMAT) 2.5-2.5 MCG/ACT AERS Inhale 2 puffs into the lungs daily. 60 each 12   [START ON 11/09/2022] apixaban (ELIQUIS) 5 MG TABS tablet Take 1 tablet (5 mg total) by mouth 2 (two) times daily. (Patient not taking: Reported on 11/05/2022) 180 tablet 0   furosemide (LASIX) 40 MG tablet TAKE 1 TABLET BY MOUTH EVERY DAY (Patient not taking: Reported on 11/03/2022) 90 tablet 0   metoprolol succinate (TOPROL-XL) 50 MG 24 hr tablet Take 1.5 tablets (75 mg total) by mouth daily. TAKE WITH OR IMMEDIATELY FOLLOWING A MEAL. 125 tablet 1   No current facility-administered medications for this visit.   BP 133/77   Pulse 86   Ht 6\' 1"  (1.854 m)   Wt 262 lb (118.8 kg)   SpO2 100%   BMI 34.57 kg/m  General: NAD Neck: No JVD, no thyromegaly or thyroid nodule.  Lungs: Distant BS CV: Nondisplaced PMI.  Heart regular S1/S2, no S3/S4, no murmur.  No peripheral edema.  No carotid bruit.  Normal pedal pulses.  Abdomen: Soft, nontender, no hepatosplenomegaly, no distention.  Skin: Intact without lesions or rashes.  Neurologic: Alert  and oriented x 3.  Psych: Normal affect. Extremities: No clubbing or cyanosis.  HEENT: Normal.   Assessment/Plan: 1. Chronic systolic CHF: Nonischemic cardiomyopathy, diagnosed 2018.  Biotronik ICD. Cath in 2018 with low CI at 1.77 and no significant CAD.  Most recent echo 1/24 with EF 20-25%, severe RV dysfunction.  Cardiac MRI in 2/24 showed LV EF 17%, RV EF 36%, small area of lateral wall subendocardial LGE, mid-wall LGE at the inferoseptal RV insertion site (nonspecific). There was a very small area of possible prior MI, but this study suggests primarily nonischemic cardiomyopathy.  NYHA class II.  Not volume overloaded on exam or by REDS clip.  As noted before, I suspect some of his dyspnea may be from COPD.    - Continue Entresto 49/51 bid, BMET/BNP today.  - Continue spironolactone 25 mg daily.  - Continue digoxin 0.0625 daily, check level today.  - Continue Jardiance 10 mg daily.  - Continue Lasix 40 mg daily.  - Increase Toprol XL to 75 mg daily.  - I will order a CPX.  2. Atrial fibrillation: Persistent, s/p DCCV to NSR in 1/24.  Suspect AF plays a role in his symptoms but given long-standing cardiomyopathy, suspect his cardiomyopathy is not solely tachycardia-mediated.  Failed DCCV in 4/24 despite amiodarone use.  He is in AF today, rate is controlled.  I do not think we are going to be able to get him into long-term NSR.  - Continue apixaban 5 mg bid.  - Stop amiodarone as he has not been able to hold NSR.  Increase Toprol XL as above.  3. COPD: Patient has been a long-standing smoker and has dyspnea out of proportion to volume overload on exam.  I suspect significant COPD. PFTs in 2/24 showed severe obstruction.   - Needs to quit smoking completely.  - Refer to pulmonary.  4. AAA/TAA: Follows with vascular.   Followup 6 wks.   Marca Ancona 11/07/22

## 2022-11-08 ENCOUNTER — Other Ambulatory Visit: Payer: Self-pay | Admitting: Family Medicine

## 2022-11-08 NOTE — Telephone Encounter (Signed)
6/3 LM 

## 2022-11-08 NOTE — Telephone Encounter (Signed)
LVM for patient to call device clinic back. Direct number provided for Device clinic

## 2022-11-09 NOTE — Telephone Encounter (Signed)
Requested Prescriptions  Pending Prescriptions Disp Refills   furosemide (LASIX) 40 MG tablet [Pharmacy Med Name: FUROSEMIDE 40 MG TABLET] 90 tablet 1    Sig: TAKE 1 TABLET BY MOUTH EVERY DAY     Cardiovascular:  Diuretics - Loop Passed - 11/08/2022  5:34 PM      Passed - K in normal range and within 180 days    Potassium  Date Value Ref Range Status  11/05/2022 4.5 3.5 - 5.1 mmol/L Final         Passed - Ca in normal range and within 180 days    Calcium  Date Value Ref Range Status  11/05/2022 9.0 8.9 - 10.3 mg/dL Final   Calcium, Ion  Date Value Ref Range Status  09/21/2022 1.19 1.15 - 1.40 mmol/L Final  09/21/2022 1.20 1.15 - 1.40 mmol/L Final         Passed - Na in normal range and within 180 days    Sodium  Date Value Ref Range Status  11/05/2022 138 135 - 145 mmol/L Final  06/29/2022 134 134 - 144 mmol/L Final         Passed - Cr in normal range and within 180 days    Creatinine, Ser  Date Value Ref Range Status  11/05/2022 0.90 0.61 - 1.24 mg/dL Final         Passed - Cl in normal range and within 180 days    Chloride  Date Value Ref Range Status  11/05/2022 103 98 - 111 mmol/L Final         Passed - Mg Level in normal range and within 180 days    Magnesium  Date Value Ref Range Status  06/22/2022 2.2 1.7 - 2.4 mg/dL Final    Comment:    Performed at [facility], [address] [place], [place] [address]         Passed - Last BP in normal range    BP Readings from Last 1 Encounters:  11/05/22 133/77         Passed - Valid encounter within last 6 months    Recent Outpatient Visits           1 month ago Diabetes mellitus with cardiac complication [facility])   [place] [facility] [place], Megan P, DO   2 months ago Post-traumatic osteoarthritis of right knee   [place] [facility] [place], Megan P, DO   2 months ago Encounter for annual wellness exam in Medicare patient   [place] [facility]  [place], Megan P, DO   4 months ago Acute gastritis, presence of bleeding unspecified, unspecified gastritis type   [place] [facility] [place], Megan P, DO   1 year ago Routine general medical examination at a health care facility   [facility] [place], [place] P, DO       Future Appointments             In 2 months [place], Megan P, DO [place] [facility]             atorvastatin (LIPITOR) 40 MG tablet [Pharmacy Med Name: ATORVASTATIN 40 MG TABLET] 90 tablet 1    Sig: TAKE 1 TABLET BY MOUTH EVERY DAY     Cardiovascular:  Antilipid - Statins Failed - 11/08/2022  5:34 PM      Failed - Lipid Panel in normal range within the last 12 months    Cholesterol,  Total  Date Value Ref Range Status  06/29/2022 100 100 - 199 mg/dL Final   LDL Chol Calc (NIH)  Date Value Ref Range Status  06/29/2022 30 0 - 99 mg/dL Final   HDL  Date Value Ref Range Status  06/29/2022 57 >39 mg/dL Final   Triglycerides  Date Value Ref Range Status  06/29/2022 57 0 - 149 mg/dL Final         Passed - Patient is not pregnant      Passed - Valid encounter within last 12 months    Recent Outpatient Visits           1 month ago Diabetes mellitus with cardiac complication [facility])   [place] [facility] [place], Megan P, DO   2 months ago Post-traumatic osteoarthritis of right knee   [place] [facility] [place], Megan P, DO   2 months ago Encounter for annual wellness exam in Medicare patient   [place] [facility] [place], Megan P, DO   4 months ago Acute gastritis, presence of bleeding unspecified, unspecified gastritis type   [place] [facility] [place], Megan P, DO   1 year ago Routine general medical examination at a health care facility   [place] [facility] Carrow, DO       Future Appointments             In 2 months Dorcas Carrow,  DO [place] [facility]            Refused Prescriptions Disp Refills   metFORMIN (GLUCOPHAGE-XR) 500 MG 24 hr tablet [Pharmacy Med Name: METFORMIN HCL ER 500 MG TABLET] 60 tablet 0    Sig: TAKE 1 TABLET BY MOUTH IN THE MORNING AND AT BEDTIME. OFFICE VISIT NEEDED FOR ADDITIONAL REFILLS     Endocrinology:  Diabetes - Biguanides Passed - 11/08/2022  5:34 PM      Passed - Cr in normal range and within 360 days    Creatinine, Ser  Date Value Ref Range Status  11/05/2022 0.90 0.61 - 1.24 mg/dL Final         Passed - HBA1C is between 0 and 7.9 and within 180 days    HB A1C (BAYER DCA - WAIVED)  Date Value Ref Range Status  10/05/2022 6.7 (H) 4.8 - 5.6 % Final    Comment:             Prediabetes: 5.7 - 6.4          Diabetes: >6.4          Glycemic control for adults with diabetes: <7.0          Passed - eGFR in normal range and within 360 days    GFR calc Af Amer  Date Value Ref Range Status  03/28/2020 96 >59 mL/min/1.73 Final    Comment:    **In accordance with recommendations from the NKF-ASN Task force,**   [facility] is in the process of updating its eGFR calculation to the   2021 CKD-EPI creatinine equation that estimates kidney function   without a race variable.    GFR, Estimated  Date Value Ref Range Status  11/05/2022 >60 >60 mL/min Final    Comment:    (NOTE) Calculated using the CKD-EPI Creatinine Equation (2021)    eGFR  Date Value Ref Range Status  06/29/2022 93 >59 mL/min/1.73 Final         Passed - B12 Level  in normal range and within 720 days    Vitamin B-12  Date Value Ref Range Status  06/18/2021 304 232 - 1,245 pg/mL Final         Passed - Valid encounter within last 6 months    Recent Outpatient Visits           1 month ago Diabetes mellitus with cardiac complication (HCC)   [place] [facility] [place], Megan P, DO   2 months ago Post-traumatic osteoarthritis of right knee   [place] [facility] [place], Megan P, DO   2 months ago Encounter for annual wellness exam in Medicare patient   [place] [facility] [place], Megan P, DO   4 months ago Acute gastritis, presence of bleeding unspecified, unspecified gastritis type   [place] [facility] [place], Megan P, DO   1 year ago Routine general medical examination at a health care facility   [facility] [place], [place] P, DO       Future Appointments             In 2 months Dorcas Carrow, DO [place] [facility]            Passed - CBC within normal limits and completed in the last 12 months    WBC  Date Value Ref Range Status  10/18/2022 4.8 4.0 - 10.5 K/uL Final   RBC  Date Value Ref Range Status  10/18/2022 4.69 4.22 - 5.81 MIL/uL Final   Hemoglobin  Date Value Ref Range Status  10/18/2022 13.9 13.0 - 17.0 g/dL Final  16/03/9603 54.0 13.0 - 17.7 g/dL Final   HCT  Date Value Ref Range Status  10/18/2022 41.7 39.0 - 52.0 % Final   Hematocrit  Date Value Ref Range Status  06/29/2022 41.1 37.5 - 51.0 % Final   MCHC  Date Value Ref Range Status  10/18/2022 33.3 30.0 - 36.0 g/dL Final   [place] [facility]  Date Value Ref Range Status  10/18/2022 29.6 26.0 - 34.0 pg Final   MCV  Date Value Ref Range Status  10/18/2022 88.9 80.0 - 100.0 fL Final  06/29/2022 85 79 - 97 fL Final   No results found for: "PLTCOUNTKUC", "LABPLAT", "POCPLA" RDW  Date Value Ref Range Status  10/18/2022 16.6 (H) 11.5 - 15.5 % Final  06/29/2022 14.1 11.6 - 15.4 % Final

## 2022-11-09 NOTE — Telephone Encounter (Signed)
Multiple attempts to reach patient by phone. Appears he uses mychart.   Sent mychart message letting him know that we are trying to reach him.

## 2022-11-10 NOTE — Telephone Encounter (Signed)
Attempted to contact patient. No answer, LMTCB.  Patient has upcoming device clinic apt 11/17/22.

## 2022-11-11 NOTE — Telephone Encounter (Signed)
[employer] remote alert received for possible RA undersensing. Pt has DX lead so will wait to see in clinic with Rosalita Chessman to see if adjustments need to be made.

## 2022-11-16 ENCOUNTER — Other Ambulatory Visit: Payer: Self-pay | Admitting: Cardiovascular Disease

## 2022-11-16 NOTE — Progress Notes (Unsigned)

## 2022-11-16 NOTE — Telephone Encounter (Signed)
Please review for refill. Thank you! 

## 2022-11-17 ENCOUNTER — Ambulatory Visit: Payer: Medicare HMO | Attending: Cardiology | Admitting: Cardiology

## 2022-11-17 ENCOUNTER — Encounter: Payer: Self-pay | Admitting: Cardiology

## 2022-11-17 VITALS — BP 96/70 | HR 70 | Ht 73.0 in | Wt 259.0 lb

## 2022-11-17 DIAGNOSIS — Z95810 Presence of automatic (implantable) cardiac defibrillator: Secondary | ICD-10-CM

## 2022-11-17 DIAGNOSIS — I428 Other cardiomyopathies: Secondary | ICD-10-CM

## 2022-11-17 LAB — CUP PACEART INCLINIC DEVICE CHECK
Date Time Interrogation Session: 20240612120010
Implantable Lead Connection Status: 753985
Implantable Lead Implant Date: 20240529
Implantable Lead Location: 753860
Implantable Lead Model: 436909
Implantable Lead Serial Number: 81545001
Implantable Pulse Generator Implant Date: 20240529
Pulse Gen Model: 429525
Pulse Gen Serial Number: 84937644

## 2022-11-17 NOTE — Patient Instructions (Signed)
Medication Instructions:  Your physician recommends that you continue on your current medications as directed. Please refer to the Current Medication list given to you today.  *If you need a refill on your cardiac medications before your next appointment, please call your pharmacy*   Lab Work: No labs ordered  If you have labs (blood work) drawn today and your tests are completely normal, you will receive your results only by: MyChart Message (if you have MyChart) OR A paper copy in the mail If you have any lab test that is abnormal or we need to change your treatment, we will call you to review the results.   Testing/Procedures: No testing ordered  Follow-Up: At [facility], you and your health needs are our priority.  As part of our continuing mission to provide you with exceptional heart care, we have created designated Provider Care Teams.  These Care Teams include your primary Cardiologist (physician) and Advanced Practice Providers (APPs -  Physician Assistants and Nurse Practitioners) who all work together to provide you with the care you need, when you need it.  We recommend signing up for the patient portal called "MyChart".  Sign up information is provided on this After Visit Summary.  MyChart is used to connect with patients for Virtual Visits (Telemedicine).  Patients are able to view lab/test results, encounter notes, upcoming appointments, etc.  Non-urgent messages can be sent to your provider as well.   To learn more about what you can do with MyChart, go to [email or web address].    Your next appointment:   11 AM on 02/09/23   Provider:   Steffanie Dunn, MD   Other Instructions OK to shower Let warm soapy water run down incision Do not scrub incision Pat dry with towel  Keep incision open to air - no ointments, creams, salves, or bandages.  Call device clinic if incision opens, has drainage, or begins to hurt more.

## 2022-11-24 ENCOUNTER — Encounter: Payer: Self-pay | Admitting: *Deleted

## 2022-11-30 ENCOUNTER — Encounter (HOSPITAL_COMMUNITY): Payer: Medicare HMO

## 2022-12-07 ENCOUNTER — Ambulatory Visit (INDEPENDENT_AMBULATORY_CARE_PROVIDER_SITE_OTHER): Payer: Medicare HMO

## 2022-12-07 ENCOUNTER — Telehealth: Payer: Medicare HMO

## 2022-12-07 DIAGNOSIS — E1159 Type 2 diabetes mellitus with other circulatory complications: Secondary | ICD-10-CM

## 2022-12-07 DIAGNOSIS — I5022 Chronic systolic (congestive) heart failure: Secondary | ICD-10-CM

## 2022-12-07 NOTE — Chronic Care Management (AMB) (Signed)
Chronic Care Management   CCM RN Visit Note  12/07/2022 Name: Eugene Henderson MRN: [id number] DOB: [birth year]  Subjective: Eugene Henderson is a 72 y.o. year old male who is a primary care patient of Kacee, Lunday, DO. The patient was referred to the Chronic Care Management team for assistance with care management needs subsequent to provider initiation of CCM services and plan of care.    Today's Visit:  Engaged with patient by telephone for follow up visit.        Goals Addressed             This Visit's Progress    CCM Expected Outcome:  Monitor, Self-Manage and Reduce Symptoms of Diabetes       Current Barriers:  Knowledge Deficits related to how often to check blood sugars and the goal of A1C levels Care Coordination needs related to the need for a new motorized chair and asking for recommendations for exercise like water aerobics in a patient with DM Chronic Disease Management support and education needs related to effective management of DM Lab Results  Component Value Date   HGBA1C 6.7 (H) 10/05/2022     Planned Interventions: Provided education to patient about basic DM disease process. Education on the goal of A1C. The patient is right at goal. Education provided on monitoring for blood sugar changes and what the A1C test was and the goal of keeping A1C <7.0 ; Reviewed medications with patient and discussed importance of medication adherence. The patient is compliant with medications. His daughter assist him with managing his medications. ;        Reviewed prescribed diet with patient heart healthy/ADA diet. The patient is eating well. Review with the patients other daughter the importance of monitoring for changes in dietary habits and watching fats, salts, and sweets. Counseled on importance of regular laboratory monitoring as prescribed. Trend down in A1C, education provided. Has blood work on a regular basis ;        Discussed plans with patient for ongoing care  management follow up and provided patient with direct contact information for care management team;      Provided patient with written educational materials related to hypo and hyperglycemia and importance of correct treatment. Denies any extreme highs or lows;       Reviewed scheduled/upcoming provider appointments including: 01-10-2023 at 940 am- reminder provided today;         Advised patient, providing education and rationale, to check cbg when you have symptoms of low or high blood sugar and as directed by the pcp  and record. Per Ursula Alert she checks the patients blood sugars 2 times a week or when the patient feels different. She states that his levels are good. call provider for findings outside established parameters;       Referral made to social work team for assistance with support and education for expressed needs. Has talked with the LCSW and knows the support of social work for expressed needs. New appointment for LCSW for 12-14-2022. The patients daughter is asking for more assistance in the home as she recently had surgery and could use the assistance with the patient. Education and support given. Referral made to community resources care guide team for assistance with has talked with the care guides for assistance with food resources and other expressed needs.The patient did not qualify for MOW at this time due to his daughter being there. 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 Wears glasses but cannot see out of them well. States he needs a new  eye exam and she will look into this. DME needs: The patients daughter states that since the patient can ambulate it is harder to get a replacement chair because insurance will not pay. The patient is working with PT and his daughters encourage him to walk.  Also provided the patients daughter with the number to: [employer]: [phone] for additional help with equipment needs as they help patients get needed medical equipment.  She states she needs a wider shower chair for the patient.   Symptom Management: Take medications as prescribed   Attend all scheduled provider appointments Call provider office for new concerns or questions  call the Suicide and Crisis Lifeline: 988 call the [place] National Suicide Prevention Lifeline: (650)628-2927 or TTY: (208) 022-9491 TTY (484) 455-5409) to talk to a trained counselor call 1-800-273-TALK (toll free, 24 hour hotline) if experiencing a Mental Health or Behavioral Health Crisis  schedule appointment with eye doctor check blood sugar at prescribed times: when you have symptoms of low or high blood sugar and as ordered by the pcp  check feet daily for cuts, sores or redness trim toenails straight across wash and dry feet carefully every day wear comfortable, cotton socks wear comfortable, well-fitting shoes  Follow Up Plan: Telephone follow up appointment with care management team member scheduled for: 01-25-2023 at 230 pm       CCM Expected Outcome:  Monitor, Self-Manage and Reduce Symptoms of Heart Failure       Current Barriers:  Knowledge Deficits related to seeing cardiologist and other specialist on a regular basis to effectively manage HF and other chronic heart conditions.  Care Coordination needs related to medication needs and other expressed needs involving interdisciplinary team involvement in a patient with HF and other chronic conditions Chronic Disease Management support and education needs related to effective management of HF and  other chronic heart conditions Wt Readings from Last 3 Encounters:  11/17/22 259 lb (117.5 kg)  11/05/22 262 lb (118.8 kg)  11/03/22 260 lb (117.9 kg)     Planned Interventions: Basic overview and discussion of pathophysiology of Heart Failure reviewed. The patient had his ICD placed and is doing well. His daughter states that he went to some classes and is not as scared of it now as he was. 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  He has had follow up with the cardiologist and pcp. He is stable and doing well per his daughter.   Provided education on low sodium diet. Education provided on heart healthy/ADA diet and will send information in the mail for planning healthy meals. Reviewed Heart Failure Action Plan in depth, review of monitoring for swelling and edema in bilateral legs  Assessed need for readable accurate scales in home Provided education about placing scale on hard, flat surface Advised patient to weigh each morning after emptying bladder Discussed importance of daily weight and advised patient to weigh and record daily Reviewed role of diuretics in  prevention of fluid overload and management of heart failure. Review of medications and the patients daughter states compliance with medications. She helps the patient manage his medications Discussed the importance of keeping all appointments with provider. 01-10-2023 at 420 pm with the pcp Provided patient with education about the role of exercise in the management of heart failure. The patients daughter is interested in the patient doing water aerobics as this has helped her with walking and balance. His daughter recently had surgery and she is getting the patient to walk with her. She says he is being more active. Praised for positive habits. Referral made to community resources care guide team for assistance with the patients daughter has spoke to the LCSW and the care guide team about resources to help with food and other expressed needs. New appointment  with LCSW on 12-14-2022 for additional help in the home Advised patient to discuss changes in heart failure, questions, and concerns with provider Screening for signs and symptoms of depression related to chronic disease state  Assessed social determinant of health barriers The patients daughter states that he has stopped smoking and is using patches. They have also convinced him to decrease his alcohol intake. She states that they also are trying to get him to drink more water to stay hydrated.   Symptom Management: Take medications as prescribed   Attend all scheduled provider appointments Call provider office for new concerns or questions  call the Suicide and Crisis Lifeline: 988 call the [place] National Suicide Prevention Lifeline: (323) 106-0186 or TTY: (978) 608-6775 TTY 858-701-3494) to talk to a trained counselor call 1-800-273-TALK (toll free, 24 hour hotline) if experiencing a Mental Health or Behavioral Health Crisis  call office if I gain more than 2 pounds in one day or 5 pounds in one week track weight in diary use salt in moderation watch for swelling in feet, ankles and legs every day weigh myself daily develop a rescue plan follow rescue plan if symptoms flare-up track symptoms and what helps feel better or worse dress right for the weather, hot or cold  Follow Up Plan: Telephone follow up appointment with care management team member scheduled for: 01-27-2023 at 230 pm          Plan:Telephone follow up appointment with care management team member scheduled for:  01-27-2023 at 230 pm   Alto Denver RN, MSN, CCM RN Care Manager  Chronic Care Management Direct Number: [phone]

## 2022-12-07 NOTE — Patient Instructions (Signed)
Please call the care guide team at [phone] if you need to cancel or reschedule your appointment.   If you are experiencing a Mental Health or Behavioral Health Crisis or need someone to talk to, please call the Suicide and Crisis Lifeline: 988 call the [place] National Suicide Prevention Lifeline: 620-691-6808 or TTY: 332-380-8435 TTY 928-580-2955) to talk to a trained counselor call 1-800-273-TALK (toll free, 24 hour hotline)   Following is a copy of the CCM Program Consent:  CCM service includes personalized support from designated clinical staff supervised by the physician, including individualized plan of care and coordination with other care providers 24/7 contact phone numbers for assistance for urgent and routine care needs. Service will only be billed when office clinical staff spend 20 minutes or more in a month to coordinate care. Only one practitioner may furnish and bill the service in a calendar month. The patient may stop CCM services at amy time (effective at the end of the month) by phone call to the office staff. The patient will be responsible for cost sharing (co-pay) or up to 20% of the service fee (after annual deductible is met)  Following is a copy of your full provider care plan:   Goals Addressed             This Visit's Progress    CCM Expected Outcome:  Monitor, Self-Manage and Reduce Symptoms of Diabetes       Current Barriers:  Knowledge Deficits related to how often to check blood sugars and the goal of A1C levels Care Coordination needs related to the need for a new motorized chair and asking for recommendations for exercise like water aerobics in a patient with DM Chronic Disease Management support and education needs related to effective management of DM Lab Results  Component Value Date   HGBA1C 6.7 (H) 10/05/2022     Planned Interventions: Provided education to patient about basic DM disease process. Education on the goal of A1C. The patient is  right at goal. Education provided on monitoring for blood sugar changes and what the A1C test was and the goal of keeping A1C <7.0 ; Reviewed medications with patient and discussed importance of medication adherence. The patient is compliant with medications. His daughter assist him with managing his medications. ;        Reviewed prescribed diet with patient heart healthy/ADA diet. The patient is eating well. Review with the patients other daughter the importance of monitoring for changes in dietary habits and watching fats, salts, and sweets. Counseled on importance of regular laboratory monitoring as prescribed. Trend down in A1C, education provided. Has blood work on a regular basis ;        Discussed plans with patient for ongoing care management follow up and provided patient with direct contact information for care management team;      Provided patient with written educational materials related to hypo and hyperglycemia and importance of correct treatment. Denies any extreme highs or lows;       Reviewed scheduled/upcoming provider appointments including: 01-10-2023 at 940 am- reminder provided today;         Advised patient, providing education and rationale, to check cbg when you have symptoms of low or high blood sugar and as directed by the pcp  and record. Per Ursula Alert she checks the patients blood sugars 2 times a week or when the patient feels different. She states that his levels are good. call provider for findings outside established parameters;  Referral made to social work team for assistance with support and education for expressed needs. Has talked with the LCSW and knows the support of social work for expressed needs. New appointment for LCSW for 12-14-2022. The patients daughter is asking for more assistance in the home as she recently had surgery and could use the assistance with the patient. Education and support given. Referral made to community resources care guide team for  assistance with has talked with the care guides for assistance with food resources and other expressed needs.The patient did not qualify for MOW at this time due to his daughter being there. 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 Wears glasses but cannot see out of them well. States he needs a new eye exam and she will look into this. DME needs: The patients daughter states that since the patient can ambulate it is harder to get a replacement chair because insurance will not pay. The patient is working with PT and his daughters encourage him to walk.  Also provided the patients daughter with the number to: [employer]: [phone] for additional help with equipment needs as they help patients get needed medical equipment.  She states she needs a wider shower chair for the patient.   Symptom Management: Take medications as prescribed   Attend all scheduled provider appointments Call provider office for new concerns or questions  call the Suicide and Crisis Lifeline: 988 call the [place] National Suicide Prevention Lifeline: 737-221-3191 or TTY: 920 573 6145 TTY 707-471-7359) to talk to a trained counselor call 1-800-273-TALK (toll free, 24 hour hotline) if experiencing a Mental Health or Behavioral Health Crisis  schedule appointment with eye doctor check blood sugar at prescribed times: when you have symptoms of low or high blood sugar and as ordered by the pcp  check feet daily for cuts, sores or redness trim toenails straight across wash and  dry feet carefully every day wear comfortable, cotton socks wear comfortable, well-fitting shoes  Follow Up Plan: Telephone follow up appointment with care management team member scheduled for: 01-25-2023 at 230 pm       CCM Expected Outcome:  Monitor, Self-Manage and Reduce Symptoms of Heart Failure       Current Barriers:  Knowledge Deficits related to seeing cardiologist and other specialist on a regular basis to effectively manage HF and other chronic heart conditions.  Care Coordination needs related to medication needs and other expressed needs involving interdisciplinary team involvement in a patient with HF and other chronic conditions Chronic Disease Management support and education needs related to effective management of HF and other chronic heart conditions Wt Readings from Last 3 Encounters:  11/17/22 259 lb (117.5 kg)  11/05/22 262 lb (118.8 kg)  11/03/22 260 lb (117.9 kg)     Planned Interventions: Basic overview and discussion of pathophysiology of Heart Failure reviewed. The patient had his ICD placed and is doing well. His daughter states that he went to some classes and  is not as scared of it now as he was. 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  He has had follow up with the cardiologist and pcp. He is stable and doing well per his daughter.   Provided education on low sodium diet. Education provided on heart healthy/ADA diet and will send information in the mail for planning healthy meals. Reviewed Heart Failure Action Plan in depth, review of monitoring for swelling and edema in bilateral legs  Assessed need for readable accurate scales in home Provided education about placing scale on hard, flat surface Advised patient to weigh each morning after emptying bladder Discussed importance of daily weight and advised patient to weigh and record daily Reviewed role of diuretics in prevention of fluid overload and management of heart failure. Review of medications and the patients daughter  states compliance with medications. She helps the patient manage his medications Discussed the importance of keeping all appointments with provider. 01-10-2023 at 420 pm with the pcp Provided patient with education about the role of exercise in the management of heart failure. The patients daughter is interested in the patient doing water aerobics as this has helped her with walking and balance. His daughter recently had surgery and she is getting the patient to walk with her. She says he is being more active. Praised for positive habits. Referral made to community resources care guide team for assistance with the patients daughter has spoke to the LCSW and the care guide team about resources to help with food and other expressed needs. New appointment with LCSW on 12-14-2022 for additional help in the home Advised patient to discuss changes in heart failure, questions, and concerns with provider Screening for signs and symptoms of depression related to chronic disease state  Assessed social determinant of health barriers The patients daughter states that he has stopped smoking and is using patches. They have also convinced him to decrease his alcohol intake. She states that they also are trying to get him to drink more water to stay hydrated.   Symptom Management: Take medications as prescribed   Attend all scheduled provider appointments Call provider office for new concerns or questions  call the Suicide and Crisis Lifeline: 988 call the [place] National Suicide Prevention Lifeline: 410-730-4527 or TTY: 236-290-4892 TTY (517) 718-4138) to talk to a trained counselor call 1-800-273-TALK (toll free, 24 hour hotline) if experiencing a Mental Health or Behavioral Health Crisis  call office if I gain more than 2 pounds in one day or 5 pounds in one week track weight in diary use salt in moderation watch for swelling in feet, ankles and legs every day weigh myself daily develop a rescue plan follow  rescue plan if symptoms flare-up track symptoms and what helps feel better or worse dress right for the weather, hot or cold  Follow Up Plan: Telephone follow up appointment with care management team member scheduled for: 01-27-2023 at 230 pm          Patient verbalizes understanding of instructions and care plan provided today and agrees to view in MyChart. Active MyChart status and patient understanding of how to access instructions and care plan via MyChart confirmed with patient.  Telephone follow up appointment with care management team member scheduled for: 01-27-2023 at 230 pm

## 2022-12-13 ENCOUNTER — Other Ambulatory Visit: Payer: Self-pay | Admitting: Family Medicine

## 2022-12-13 ENCOUNTER — Encounter (HOSPITAL_COMMUNITY): Payer: Medicare HMO

## 2022-12-13 ENCOUNTER — Other Ambulatory Visit: Payer: Self-pay | Admitting: Nurse Practitioner

## 2022-12-14 ENCOUNTER — Ambulatory Visit: Payer: Self-pay | Admitting: *Deleted

## 2022-12-14 ENCOUNTER — Telehealth: Payer: Self-pay | Admitting: *Deleted

## 2022-12-14 NOTE — Addendum Note (Signed)
Addended by: Wenda Overland on: 12/14/2022 02:56 PM   Modules accepted: Orders, Level of Service

## 2022-12-14 NOTE — Progress Notes (Signed)
This encounter was created in error - please disregard.

## 2022-12-14 NOTE — Telephone Encounter (Signed)
Requested by interface surescripts. Future visit in 3 weeks.  Requested Prescriptions  Pending Prescriptions Disp Refills   metFORMIN (GLUCOPHAGE) 500 MG tablet [Pharmacy Med Name: METFORMIN HCL 500 MG TABLET] 180 tablet 1    Sig: TAKE 1 TABLET BY MOUTH 2 TIMES DAILY WITH A MEAL.     Endocrinology:  Diabetes - Biguanides Passed - 12/13/2022  7:34 PM      Passed - Cr in normal range and within 360 days    Creatinine, Ser  Date Value Ref Range Status  11/05/2022 0.90 0.61 - 1.24 mg/dL Final         Passed - HBA1C is between 0 and 7.9 and within 180 days    HB A1C (BAYER DCA - WAIVED)  Date Value Ref Range Status  10/05/2022 6.7 (H) 4.8 - 5.6 % Final    Comment:             Prediabetes: 5.7 - 6.4          Diabetes: >6.4          Glycemic control for adults with diabetes: <7.0          Passed - eGFR in normal range and within 360 days    GFR calc Af Amer  Date Value Ref Range Status  03/28/2020 96 >59 mL/min/1.73 Final    Comment:    **In accordance with recommendations from the NKF-ASN Task force,**   [facility] is in the process of updating its eGFR calculation to the   2021 CKD-EPI creatinine equation that estimates kidney function   without a race variable.    GFR, Estimated  Date Value Ref Range Status  11/05/2022 >60 >60 mL/min Final    Comment:    (NOTE) Calculated using the CKD-EPI Creatinine Equation (2021)    eGFR  Date Value Ref Range Status  06/29/2022 93 >59 mL/min/1.73 Final         Passed - B12 Level in normal range and within 720 days    Vitamin B-12  Date Value Ref Range Status  06/18/2021 304 232 - 1,245 pg/mL Final         Passed - Valid encounter within last 6 months    Recent Outpatient Visits           2 months ago Diabetes mellitus with cardiac complication [facility])   [place] [facility] [place], Megan P, DO   3 months ago Post-traumatic osteoarthritis of right knee   [place] [facility] [place], Megan P, DO    3 months ago Encounter for annual wellness exam in Medicare patient   [place] [facility] [place], Megan P, DO   5 months ago Acute gastritis, presence of bleeding unspecified, unspecified gastritis type   [place] [facility] [place], Megan P, DO   1 year ago Routine general medical examination at a health care facility   [facility] [place], [place] P, DO       Future Appointments             In 3 weeks Dorcas Carrow, DO [facility]            Passed - CBC within normal limits and completed in the last 12 months    WBC  Date Value Ref Range Status  10/18/2022 4.8 4.0 - 10.5 K/uL Final   RBC  Date Value Ref Range Status  10/18/2022 4.69 4.22 - 5.81 MIL/uL Final  Hemoglobin  Date Value Ref Range Status  10/18/2022 13.9 13.0 - 17.0 g/dL Final  16/03/9603 54.0 13.0 - 17.7 g/dL Final   HCT  Date Value Ref Range Status  10/18/2022 41.7 39.0 - 52.0 % Final   Hematocrit  Date Value Ref Range Status  06/29/2022 41.1 37.5 - 51.0 % Final   MCHC  Date Value Ref Range Status  10/18/2022 33.3 30.0 - 36.0 g/dL Final   [facility]  Date Value Ref Range Status  10/18/2022 29.6 26.0 - 34.0 pg Final   MCV  Date Value Ref Range Status  10/18/2022 88.9 80.0 - 100.0 fL Final  06/29/2022 85 79 - 97 fL Final   No results found for: "PLTCOUNTKUC", "LABPLAT", "POCPLA" RDW  Date Value Ref Range Status  10/18/2022 16.6 (H) 11.5 - 15.5 % Final  06/29/2022 14.1 11.6 - 15.4 % Final

## 2022-12-14 NOTE — Patient Outreach (Addendum)
  Care Coordination   12/14/2022 Name: Eugene Henderson MRN: [id number] DOB: 06-09-1950   Care Coordination Outreach Attempts:  An unsuccessful telephone outreach was attempted for a scheduled appointment today.  Follow Up Plan:  Additional outreach attempts will be made to offer the patient care coordination information and services.   Encounter Outcome:  No Answer   Care Coordination Interventions:  No, not indicated    Eugene Mckiver, LCSW Clinical Social Worker  [facility] Care Management [phone]

## 2022-12-14 NOTE — Telephone Encounter (Signed)
Requested Prescriptions  Pending Prescriptions Disp Refills   pantoprazole (PROTONIX) 40 MG tablet [Pharmacy Med Name: PANTOPRAZOLE SOD DR 40 MG TAB] 180 tablet 0    Sig: TAKE 1 TABLET BY MOUTH TWICE A DAY     Gastroenterology: Proton Pump Inhibitors Passed - 12/13/2022  7:28 PM      Passed - Valid encounter within last 12 months    Recent Outpatient Visits           2 months ago Diabetes mellitus with cardiac complication [facility])   [place] [facility] [place], Megan P, DO   3 months ago Post-traumatic osteoarthritis of right knee   [place] [facility] [place], Megan P, DO   3 months ago Encounter for annual wellness exam in Medicare patient   [place] [facility] [place], Megan P, DO   5 months ago Acute gastritis, presence of bleeding unspecified, unspecified gastritis type   [place] [facility] [place], Megan P, DO   1 year ago Routine general medical examination at a health care facility   [facility] Dorcas Carrow, DO       Future Appointments             In 3 weeks Dorcas Carrow, DO  [facility]

## 2022-12-14 NOTE — Patient Outreach (Signed)
  Care Coordination   12/14/2022 Name: Eugene Henderson MRN: [id number] DOB: December 25, 1950   Care Coordination Outreach Attempts:  An unsuccessful telephone outreach was attempted for a scheduled appointment today.  Follow Up Plan:  Additional outreach attempts will be made to offer the patient care coordination information and services.   Encounter Outcome:  Pt. Visit Completed   Care Coordination Interventions:  No, not indicated    Necha Harries, LCSW Clinical Social Worker  [facility] Care Management [phone]

## 2022-12-15 ENCOUNTER — Ambulatory Visit (HOSPITAL_BASED_OUTPATIENT_CLINIC_OR_DEPARTMENT_OTHER): Payer: Medicare HMO | Admitting: Cardiology

## 2022-12-15 ENCOUNTER — Other Ambulatory Visit
Admission: RE | Admit: 2022-12-15 | Discharge: 2022-12-15 | Disposition: A | Discharge status: Home / Self Care | Payer: Medicare HMO | Source: Ambulatory Visit | Attending: Cardiology | Admitting: Cardiology

## 2022-12-15 VITALS — BP 129/92 | HR 81 | Ht 73.0 in | Wt 264.0 lb

## 2022-12-15 DIAGNOSIS — I5022 Chronic systolic (congestive) heart failure: Secondary | ICD-10-CM | POA: Diagnosis not present

## 2022-12-15 LAB — BASIC METABOLIC PANEL
Anion gap: 9 (ref 5–15)
BUN: 26 mg/dL — ABNORMAL HIGH (ref 8–23)
CO2: 23 mmol/L (ref 22–32)
Calcium: 9.3 mg/dL (ref 8.9–10.3)
Chloride: 105 mmol/L (ref 98–111)
Creatinine, Ser: 1.06 mg/dL (ref 0.61–1.24)
GFR, Estimated: >60 mL/min (ref >60)
Glucose, Bld: 119 mg/dL — ABNORMAL HIGH (ref 70–99)
Potassium: 3.8 mmol/L (ref 3.5–5.1)
Sodium: 137 mmol/L (ref 135–145)

## 2022-12-15 LAB — BRAIN NATRIURETIC PEPTIDE: B Natriuretic Peptide: 140.2 pg/mL — ABNORMAL HIGH (ref 0.0–100.0)

## 2022-12-15 LAB — DIGOXIN LEVEL: Digoxin Level: <0.2 ng/mL — ABNORMAL LOW (ref 0.8–2.0)

## 2022-12-15 MED ORDER — SACUBITRIL-VALSARTAN 97-103 MG PO TABS: 1.0000 | ORAL_TABLET | Freq: Two times a day (BID) | ORAL | 6 refills | Status: DC

## 2022-12-15 NOTE — Patient Instructions (Addendum)
Medication Changes:  Increase Entresto to 97/103 mg (1 tablet) 2 times a day.    Lab Work:  Labs done today, your results will be available in MyChart, we will contact you for abnormal readings.  You will have lab work in 10 days. Please come 7/19 Friday to get your lab work done before 4:30 pm.  Come to [facility] main entrance.   Testing/Procedures: Your physician has recommended that you have a cardiopulmonary stress test (CPX). CPX testing is a non-invasive measurement of heart and lung function. It replaces a traditional treadmill stress test. This type of test provides a tremendous amount of information that relates not only to your present condition but also for future outcomes. This test combines measurements of you ventilation, respiratory gas exchange in the lungs, electrocardiogram (EKG), blood pressure and physical response before, during, and following an exercise protocol.  You have your CPX test done on 01/12/2023     Special Instructions // Education: Do the following things EVERYDAY: Weigh yourself in the morning before breakfast. Write it down and keep it in a log. Take your medicines as prescribed Eat low salt foods--Limit salt (sodium) to 2000 mg per day.  Stay as active as you can everyday Limit all fluids for the day to less than 2 liters   Follow-Up in: follow in 2 months with Dr. Shirlee Latch.     If you have any questions or concerns before your next appointment please send [place] a message through [place] or call our office at [phone] Monday-Friday 8 am-5 pm.   If you have an urgent need after hours on the weekend please call your Primary Cardiologist or the Advanced Heart Failure Clinic in [place] at [phone].

## 2022-12-15 NOTE — Progress Notes (Signed)
PCP: Dorcas Carrow, DO  Eugene Henderson is a 72 y.o. male with nonischemic cardiomyopathy based on 2018 cath, HTN, T2DM, HFrEF, long standing persistent atrial fibrillation, hx of tobacco use.  He was admitted on 06/14/22 with complains of chest pain and shortness of breath. On admission, patient noted to be in atrial fibrillation w/ RVR.  He was diuresed and underwent DCCV to NSR.  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  Echo in 1/24 showed EF 20-25% with severe RV dysfunction. Cardiac MRI was done in 2/24, showing LV EF 17%, RV EF 36%, small area of lateral wall subendocardial LGE, mid-wall LGE at the inferoseptal RV insertion site (nonspecific). There was a very small area of possible prior MI, but this study suggests primarily nonischemic cardiomyopathy.    RHC in 4/24 showed normal filling pressures with mildly decreased CI at 2.2.  He failed DCCV on amiodarone in 4/24.  Biotronik ICD was placed in 5/24.   Patient returns for followup of CHF.  He remains in atrial fibrillation.  Weight up 2 lbs.  Main complaint is right knee pain due to arthritis.  He also has low back pain.  No lightheadedness.  No chest pain.  Minimal exertional dyspnea though not very active.  No dyspnea walking around his house and no longer gets short of breath in the shower.  No orthopnea/PND.   ECG (personally reviewed): Atrial fibrillation, LAFB  Labs (1/24): K 4.3, creatinine 0.86, LDL 30 Labs (2/24): K 3, creatinine 0.89 Labs (4/24): LFTs normal, TSH normal Labs (5/24): K 4.3, creatinine 1.32 => 0.9, BNP 174  PMH: 1. Chronic systolic CHF: Nonischemic cardiomyopathy.  Biotronik ICD.  - Echo (2018): EF 25-30% - RHC/LHC (2018): mean RA 12, PA 37/12, mean PCWP 12, CI 1.77; no significant CAD.  - Echo (1/24): EF 20-25%, severely decreased RV systolic function.  - Cardiac MRI (2/24): LV  EF 17%, RV EF 36%, small area of lateral wall subendocardial LGE, mid-wall LGE at the inferoseptal RV insertion site (nonspecific).  - RHC (4/24): mean RA 11, PA 40/16 mean 29, mean PCWP 13, CI 2.22, PAPi 2.2, PVR 3 WU 2. Ascending aortic aneurysm - 4.7 cm ascending aorta on CTA in 1/24.  3. HTN 4. H/o hyperthyroidism 5. Atrial fibrillation: Permanent  - 1/24 DCCV to NSR.  - Failed DCCV 4/24.  6. Type 2 diabetes 7. 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  8. COPD: Quit smoking in 2024.  - PFTs (2/24): Severe obstruction, +restriction.   Social History   Socioeconomic History   Marital status: Single    Spouse name: Not on file   Number of children: Not on file   Years of education: Not on file   Highest education level: Not on file  Occupational History   Occupation: retired  Tobacco Use   Smoking status: Former    Types: Pipe, Cigarettes    Quit date: 08/30/2022    Years since quitting: 0.2   Smokeless tobacco: Never  Vaping Use   Vaping Use: Never used  Substance and Sexual Activity   Alcohol use: Yes    Alcohol/week: 2.0 standard drinks of alcohol    Types: 2 Cans of beer per week    Comment: 2 40oz beers/week   Drug use: No  Sexual activity: Not Currently  Other Topics Concern   Not on file  Social History Narrative   Not on file   Social Determinants of Health   Financial Resource Strain: Low Risk  (07/27/2022)   Overall Financial Resource Strain (CARDIA)    Difficulty of Paying Living Expenses: Not very hard  Food Insecurity: No Food Insecurity (07/27/2022)   Hunger Vital Sign    Worried About Running Out of Food in the Last Year: Never true    Ran Out of Food in the Last Year: Never true  Transportation Needs: No Transportation Needs (07/27/2022)   PRAPARE - Administrator, Civil Service (Medical): No    Lack of Transportation  (Non-Medical): No  Recent Concern: Transportation Needs - Unmet Transportation Needs (07/12/2022)   PRAPARE - Administrator, Civil Service (Medical): Yes    Lack of Transportation (Non-Medical): No  Physical Activity: Inactive (07/27/2022)   Exercise Vital Sign    Days of Exercise per Week: 0 days    Minutes of Exercise per Session: 0 min  Stress: No Stress Concern Present (07/27/2022)   [employer] of Occupational Health - Occupational Stress Questionnaire    Feeling of Stress : Not at all  Social Connections: Socially Isolated (07/27/2022)   Social Connection and Isolation Panel [NHANES]    Frequency of Communication with Friends and Family: More than three times a week    Frequency of Social Gatherings with Friends and Family: More than three times a week    Attends Religious Services: Never    Database administrator or Organizations: No    Attends Banker Meetings: Never    Marital Status: Divorced  Catering manager Violence: Not At Risk (07/27/2022)   Humiliation, Afraid, Rape, and Kick questionnaire    Fear of Current or Ex-Partner: No    Emotionally Abused: No    Physically Abused: No    Sexually Abused: No   Family History  Problem Relation Age of Onset   Diabetes Mother    Diabetes Father    Heart disease Brother    Heart attack Brother    Diabetes Brother    Kidney failure Brother    Thyroid disease Neg Hx    ROS: All systems reviewed and negative except as per HPI.   Current Outpatient Medications  Medication Sig Dispense Refill   acetaminophen (TYLENOL) 500 MG tablet Take 500-1,000 mg by mouth every 6 (six) hours as needed for mild pain, fever or headache.      albuterol (VENTOLIN HFA) 108 (90 Base) MCG/ACT inhaler TAKE 2 PUFFS BY MOUTH EVERY 6 HOURS AS NEEDED FOR WHEEZE OR SHORTNESS OF BREATH 6.7 each 3   apixaban (ELIQUIS) 5 MG TABS tablet Take 1 tablet (5 mg total) by mouth 2 (two) times daily. 180 tablet 0   atorvastatin (LIPITOR)  40 MG tablet TAKE 1 TABLET BY MOUTH EVERY DAY 90 tablet 1   digoxin (LANOXIN) 0.125 MG tablet Take 0.5 tablet daily. (0.0625 mg) 45 tablet 3   folic acid (FOLVITE) 1 MG tablet TAKE 1 TABLET BY MOUTH EVERY DAY 90 tablet 2   furosemide (LASIX) 40 MG tablet TAKE 1 TABLET BY MOUTH EVERY DAY 90 tablet 1   ibuprofen (ADVIL) 200 MG tablet Take 800 mg by mouth every 6 (six) hours as needed for moderate pain.     JARDIANCE 10 MG TABS tablet TAKE 1 TABLET BY MOUTH EVERY DAY BEFORE BREAKFAST 30 tablet 5   metFORMIN (  GLUCOPHAGE) 500 MG tablet TAKE 1 TABLET BY MOUTH 2 TIMES DAILY WITH A MEAL. 180 tablet 1   metoprolol succinate (TOPROL-XL) 50 MG 24 hr tablet Take 1.5 tablets (75 mg total) by mouth daily. TAKE WITH OR IMMEDIATELY FOLLOWING A MEAL. 125 tablet 1   pantoprazole (PROTONIX) 40 MG tablet TAKE 1 TABLET BY MOUTH TWICE A DAY 180 tablet 0   potassium chloride SA (KLOR-CON M) 20 MEQ tablet Take 20 mEq by mouth 2 (two) times daily.     prednisoLONE acetate (PRED FORTE) 1 % ophthalmic suspension 1 drop 4 (four) times daily. 5 mL 0   sacubitril-valsartan (ENTRESTO) 97-103 MG Take 1 tablet by mouth 2 (two) times daily. 60 tablet 6   spironolactone (ALDACTONE) 25 MG tablet TAKE 1 TABLET (25 MG TOTAL) BY MOUTH DAILY. 90 tablet 0   Tiotropium Bromide-Olodaterol (STIOLTO RESPIMAT) 2.5-2.5 MCG/ACT AERS Inhale 2 puffs into the lungs daily. 60 each 12   No current facility-administered medications for this visit.   BP (!) 129/92   Pulse 81   Ht 6\' 1"  (1.854 m)   Wt 264 lb (119.7 kg)   SpO2 100%   BMI 34.83 kg/m  General: NAD Neck: No JVD, no thyromegaly or thyroid nodule.  Lungs: Clear to auscultation bilaterally with normal respiratory effort. CV: Nondisplaced PMI.  Heart regular S1/S2, no S3/S4, no murmur.  No peripheral edema.  No carotid bruit.  Normal pedal pulses.  Abdomen: Soft, nontender, no hepatosplenomegaly, no distention.  Skin: Intact without lesions or rashes.  Neurologic: Alert and oriented  x 3.  Psych: Normal affect. Extremities: No clubbing or cyanosis.  HEENT: Normal.   Assessment/Plan: 1. Chronic systolic CHF: Nonischemic cardiomyopathy, diagnosed 2018.  Biotronik ICD. Cath in 2018 with low CI at 1.77 and no significant CAD.  Most recent echo 1/24 with EF 20-25%, severe RV dysfunction.  Cardiac MRI in 2/24 showed LV EF 17%, RV EF 36%, small area of lateral wall subendocardial LGE, mid-wall LGE at the inferoseptal RV insertion site (nonspecific). There was a very small area of possible prior MI, but this study suggests primarily nonischemic cardiomyopathy.  Stable NYHA class II.  Not volume overloaded on exam.   - Increase Entresto to 97/103 bid. BMET/BNP today and BMET in 10 days.  - Continue spironolactone 25 mg daily.  - Continue digoxin 0.0625 daily, check level today.  - Continue Jardiance 10 mg daily.  - Continue Lasix 40 mg daily.  - Continue Toprol XL 75 mg daily.  - I will order a CPX, he thinks he can ride the bike (would have hard time on treadmill with knee arthritis).  2. Atrial fibrillation: Persistent, s/p DCCV to NSR in 1/24.  Suspect AF plays a role in his symptoms but given long-standing cardiomyopathy, suspect his cardiomyopathy is not solely tachycardia-mediated.  Failed DCCV in 4/24 despite amiodarone use.  He is in AF today, rate is controlled.  I do not think we are going to be able to get him into long-term NSR.  - Continue apixaban 5 mg bid.  3. COPD: Patient has been a long-standing smoker and has had dyspnea out of proportion to volume overload on exam.  I suspect significant COPD. PFTs in 2/24 showed severe obstruction.   - Needs to quit smoking completely.  4. AAA/TAA: Follows with vascular.   Followup 2 months  Marca Ancona 12/15/22

## 2022-12-16 ENCOUNTER — Other Ambulatory Visit: Payer: Self-pay | Admitting: Cardiology

## 2022-12-16 ENCOUNTER — Other Ambulatory Visit (HOSPITAL_COMMUNITY): Payer: Self-pay

## 2022-12-16 DIAGNOSIS — I429 Cardiomyopathy, unspecified: Secondary | ICD-10-CM

## 2022-12-16 MED ORDER — DIGOXIN 125 MCG PO TABS: ORAL_TABLET | ORAL | 1 refills | Status: DC

## 2022-12-16 MED ORDER — DIGOXIN 125 MCG PO TABS: 0.1250 mg | ORAL_TABLET | Freq: Every day | ORAL | 3 refills | Status: DC

## 2022-12-16 NOTE — Telephone Encounter (Signed)
-----   Message from Nurse Velia M sent at 12/15/2022  3:54 PM EDT -----  ----- Message ----- From: McLean, Dalton S, MD Sent: 12/15/2022   1:31 PM EDT To: Velia G Moreno, RN  Make sure he is taking digoxin.   

## 2022-12-16 NOTE — Telephone Encounter (Signed)
-----   Message from Nurse Sheryn Bison sent at 12/15/2022  3:54 PM EDT -----  ----- Message ----- From: Laurey Morale, MD Sent: 12/15/2022   1:31 PM EDT To: Electa Sniff, RN  Make sure he is taking digoxin.

## 2022-12-16 NOTE — Progress Notes (Signed)
Order for digoxin was placed by [facility] CMA. Insurance coverage confirmed with Stephaine CPhT>$[id number] for 90 day digoxin 0.125 mg (take 0.5 tab). Yazmine spoke with pt to let them know med was sent to pharmacy and pricing information. Pt aware, agreeable, and verbalized understanding.

## 2022-12-24 ENCOUNTER — Other Ambulatory Visit
Admission: RE | Admit: 2022-12-24 | Discharge: 2022-12-24 | Disposition: A | Discharge status: Home / Self Care | Payer: Medicare HMO | Attending: Cardiology | Admitting: Cardiology

## 2022-12-24 DIAGNOSIS — I5022 Chronic systolic (congestive) heart failure: Secondary | ICD-10-CM | POA: Insufficient documentation

## 2022-12-24 LAB — BASIC METABOLIC PANEL
Anion gap: 11 (ref 5–15)
BUN: 28 mg/dL — ABNORMAL HIGH (ref 8–23)
CO2: 22 mmol/L (ref 22–32)
Calcium: 9.2 mg/dL (ref 8.9–10.3)
Chloride: 101 mmol/L (ref 98–111)
Creatinine, Ser: 0.96 mg/dL (ref 0.61–1.24)
GFR, Estimated: >60 mL/min (ref >60)
Glucose, Bld: 95 mg/dL (ref 70–99)
Potassium: 4.1 mmol/L (ref 3.5–5.1)
Sodium: 134 mmol/L — ABNORMAL LOW (ref 135–145)

## 2022-12-28 ENCOUNTER — Ambulatory Visit: Payer: Medicare HMO | Admitting: Student in an Organized Health Care Education/Training Program

## 2022-12-29 ENCOUNTER — Ambulatory Visit: Payer: Medicare HMO | Admitting: Student in an Organized Health Care Education/Training Program

## 2022-12-29 ENCOUNTER — Encounter: Payer: Self-pay | Admitting: Student in an Organized Health Care Education/Training Program

## 2022-12-29 VITALS — BP 110/60 | HR 86 | Temp 97.6°F | Ht 73.0 in | Wt 262.0 lb

## 2022-12-29 DIAGNOSIS — Z720 Tobacco use: Secondary | ICD-10-CM | POA: Diagnosis not present

## 2022-12-29 DIAGNOSIS — J432 Centrilobular emphysema: Secondary | ICD-10-CM

## 2022-12-29 DIAGNOSIS — F17210 Nicotine dependence, cigarettes, uncomplicated: Secondary | ICD-10-CM

## 2022-12-29 MED ORDER — STIOLTO RESPIMAT 2.5-2.5 MCG/ACT IN AERS: 2.0000 | INHALATION_SPRAY | Freq: Every day | RESPIRATORY_TRACT | 11 refills | Status: DC

## 2022-12-29 NOTE — Progress Notes (Signed)
Assessment & Plan:   1. Centrilobular emphysema (HCC)  Presenting for the evaluation of shortness of breath that is multi-factorial with his HFrEF, Pulmonary Hypertension, Afib, and COPD all contributing to symptoms.   He does have emphysema noted on a recent chest CT, and PFT's from February of 2024 are consistent with COPD (FEV1/FVC 0.46, FEV1 35% predicted/z score -5). 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 He has not had any COPD exacerbations nor does he have documented eosinophilia and given this I will shy away from ICS therapy for the time being. He was switched to LABA/LAMA with Stiolto and feels much better with it. I will continue his inhaler therapy as before without change. Lung cancer screening referral sent, his will not be due for another 6 months given he had imaging done earlier this year. Patient counseled on the importance of smoking cessation to help with control over symptoms.  Finally, patient is followed closely with cardiology for his HFrEF and atrial fibrillation. He has had a RHC that showed an elevated mean PAP but with PAOP returning at 13. He is maintained on Eliquis for thrombo-prophylaxis and continues to follow closely with cardiology.  - Tiotropium Bromide-Olodaterol (STIOLTO RESPIMAT) 2.5-2.5 MCG/ACT AERS; Inhale 2 puffs into the lungs daily.  Dispense: 4 g; Refill: 11   Return in about 6 months (around 07/01/2023).  I spent 30 minutes caring for this patient today, including preparing to see the patient, obtaining a medical history , reviewing a separately obtained history, performing a medically appropriate examination and/or evaluation, counseling and educating the patient/family/caregiver, ordering medications, tests, or procedures, documenting clinical information in the electronic health record, and independently interpreting results (not separately reported/billed) and communicating results to the  patient/family/caregiver. 3 minutes spent on smoking cessation counseling.  Raechel Chute, MD [facility] 12/29/2022 3:11 PM    End of visit medications:  Meds ordered this encounter  Medications   Tiotropium Bromide-Olodaterol (STIOLTO RESPIMAT) 2.5-2.5 MCG/ACT AERS    Sig: Inhale 2 puffs into the lungs daily.    Dispense:  4 g    Refill:  11     Current Outpatient Medications:    acetaminophen (TYLENOL) 500 MG tablet, Take 500-1,000 mg by mouth every 6 (six) hours as needed for mild pain, fever or headache. , Disp: , Rfl:    albuterol (VENTOLIN HFA) 108 (90 Base) MCG/ACT inhaler, TAKE 2 PUFFS BY MOUTH EVERY 6 HOURS AS NEEDED FOR WHEEZE OR SHORTNESS OF BREATH, Disp: 6.7 each, Rfl: 3   amiodarone (PACERONE) 200 MG tablet, Take 1 tablet (200 mg total) by mouth daily., Disp: 90 tablet, Rfl: 6   apixaban (ELIQUIS) 5 MG TABS tablet, Take 1 tablet (5 mg total) by mouth 2 (two) times daily., Disp: 180 tablet, Rfl: 0   atorvastatin (LIPITOR) 40 MG tablet, TAKE 1 TABLET BY MOUTH EVERY DAY, Disp: 90 tablet, Rfl: 1   digoxin (LANOXIN) 0.125 MG tablet, Take 0.5 tablet daily. (0.0625 mg), Disp: 45 tablet, Rfl: 3   digoxin (LANOXIN) 0.125 MG tablet, Take 1/2 tablet daily., Disp: 90 tablet, Rfl: 1   folic acid (FOLVITE) 1 MG tablet, TAKE 1 TABLET BY MOUTH EVERY DAY, Disp: 90 tablet, Rfl: 2   furosemide (LASIX) 40 MG tablet, TAKE 1 TABLET BY MOUTH EVERY DAY, Disp: 90 tablet, Rfl: 1   ibuprofen (ADVIL) 200 MG tablet, Take 800 mg by mouth every 6 (six) hours as needed for moderate pain.,  Disp: , Rfl:    JARDIANCE 10 MG TABS tablet, TAKE 1 TABLET BY MOUTH EVERY DAY BEFORE BREAKFAST, Disp: 30 tablet, Rfl: 5   metFORMIN (GLUCOPHAGE) 500 MG tablet, TAKE 1 TABLET BY MOUTH 2 TIMES DAILY WITH A MEAL., Disp: 180 tablet, Rfl: 1   metoprolol succinate (TOPROL-XL) 50 MG 24 hr tablet, Take 1.5 tablets (75 mg total) by mouth daily. TAKE WITH OR IMMEDIATELY FOLLOWING A MEAL., Disp: 125 tablet,  Rfl: 1   pantoprazole (PROTONIX) 40 MG tablet, TAKE 1 TABLET BY MOUTH TWICE A DAY, Disp: 180 tablet, Rfl: 0   potassium chloride SA (KLOR-CON M) 20 MEQ tablet, Take 20 mEq by mouth 2 (two) times daily., Disp: , Rfl:    prednisoLONE acetate (PRED FORTE) 1 % ophthalmic suspension, 1 drop 4 (four) times daily., Disp: 5 mL, Rfl: 0   sacubitril-valsartan (ENTRESTO) 97-103 MG, Take 1 tablet by mouth 2 (two) times daily., Disp: 60 tablet, Rfl: 6   spironolactone (ALDACTONE) 25 MG tablet, TAKE 1 TABLET (25 MG TOTAL) BY MOUTH DAILY., Disp: 90 tablet, Rfl: 0   Tiotropium Bromide-Olodaterol (STIOLTO RESPIMAT) 2.5-2.5 MCG/ACT AERS, Inhale 2 puffs into the lungs daily., Disp: 4 g, Rfl: 11   Subjective:   PATIENT ID: Eugene Henderson GENDER: male DOB: 10-22-1950, MRN: [id number]  Chief Complaint  Patient presents with   Follow-up    SOB with exertion and laying flat and prod cough with clear sputum    HPI  The patient is a pleasant 72 year old male with a past medical history of HFrEF, T2DM, Afib, AAA (s/p coil embolization and stent placement) and HTN who presents to clinic for follow up.   Patient has been followed closely in the heart failure clinic by Dr. Shirlee Latch. He has underwent an investigation for his HFrEF and is referred to [place] to assess for COPD given his smoking history as well has his PFT results.  Interval history notes improvement in shortness of breath with Stiolto. Patient is compliant with it. He still feels some shortness of breath when he lays flat, but otherwise doesn't report exertional dyspnea, cough, or wheezing. No sputum production is reported.   Patient had been followed by cardiology for a long time for HFrEF and was recently established with our heart failure team. He was admitted in January of 2024 to the hospital for chest pain and shortness of breath at which point he was found to be in Afib with RVR. He underwent DC cardioversion during said admission. He's unfortunately  remained in atrial fibrillation and rhythm control was abandoned. Patient underwent ICD placement in May. He continues to follow up closely with advanced heart failure.   He underwent repeat RHC on 09/21/2022 with the following findings: RA 11, RV 38/11, PA 40/16 (29), PCWP 13, CO 5.26, CI 2.22, PVR 3 WU, PAPi 2.2.   Patient reports a long standing history of smoking for around 45 years, with between 30 and 40 pack years of smoking history. He quit smoking but relapsed last month. He's reporting abstinence again since two days ago.  Ancillary information including prior medications, full medical/surgical/family/social histories, and PFTs (when available) are listed below and have been reviewed.   Review of Systems  Constitutional:  Negative for chills, fever, malaise/fatigue and weight loss.  Respiratory:  Negative for cough, hemoptysis, sputum production and shortness of breath.   Cardiovascular:  Negative for chest pain.     Objective:   Vitals:   12/29/22 1458  BP: 110/60  Pulse: 86  Temp: 97.6 F (36.4 C)  TempSrc: Temporal  SpO2: 96%  Weight: 262 lb (118.8 kg)  Height: 6\' 1"  (1.854 m)   96% on RA  BMI Readings from Last 3 Encounters:  12/29/22 34.57 kg/m  12/15/22 34.83 kg/m  11/17/22 34.17 kg/m   Wt Readings from Last 3 Encounters:  12/29/22 262 lb (118.8 kg)  12/15/22 264 lb (119.7 kg)  11/17/22 259 lb (117.5 kg)    Physical Exam Constitutional:      Appearance: Normal appearance. He is obese.  HENT:     Mouth/Throat:     Mouth: Mucous membranes are moist.  Cardiovascular:     Rate and Rhythm: Normal rate. Rhythm irregular.     Pulses: Normal pulses.     Heart sounds: Normal heart sounds.  Pulmonary:     Effort: Pulmonary effort is normal. No respiratory distress.     Breath sounds: Normal breath sounds. No wheezing, rhonchi or rales.  Abdominal:     General: There is distension.     Palpations: Abdomen is soft.  Musculoskeletal:     Right lower leg:  No edema.     Left lower leg: No edema.  Neurological:     General: No focal deficit present.     Mental Status: He is alert.       Ancillary Information    Past Medical History:  Diagnosis Date   Arthritis    Ascending aortic aneurysm (HCC)    a. 09/2017 Stable TAA - 5.1cm.   Asthma    Chronic systolic CHF (congestive heart failure) (HCC)    a. EF 25-30% by echo in 07/2016 with cath showing no significant CAD b. 01/2017: EF 30-35% with diffuse HK and moderate MR; c. 07/2017 Echo: EF 30-35%, diff hK. Mild MR, mildly dil LA. PASP .   Diverticulitis    Diverticulitis of large intestine with perforation without abscess or bleeding 05/13/2017   Hypertension    Hyperthyroidism    NICM (nonischemic cardiomyopathy) (HCC)    Noncompliance    Persistent atrial fibrillation (HCC)    a. CHA2DS2VASc = 3-->Eliquis (? compliance).     Family History  Problem Relation Age of Onset   Diabetes Mother    Diabetes Father    Heart disease Brother    Heart attack Brother    Diabetes Brother    Kidney failure Brother    Thyroid disease Neg Hx      Past Surgical History:  Procedure Laterality Date   CARDIOVERSION N/A 06/23/2022   Procedure: CARDIOVERSION;  Surgeon: Dorthula Nettles, DO;  Location: [facility] ORS;  Service: Cardiovascular;  Laterality: N/A;   CARDIOVERSION N/A 06/25/2022   Procedure: CARDIOVERSION;  Surgeon: Dorthula Nettles, DO;  Location: [facility] ORS;  Service: Cardiovascular;  Laterality: N/A;   CARDIOVERSION N/A 09/29/2022   Procedure: CARDIOVERSION;  Surgeon: Laurey Morale, MD;  Location: [facility] INVASIVE CV LAB;  Service: Cardiovascular;  Laterality: N/A;   COLONOSCOPY N/A 08/27/2019   Procedure: COLONOSCOPY;  Surgeon: Toledo, Boykin Nearing, MD;  Location: [facility] ENDOSCOPY;  Service: Gastroenterology;  Laterality: N/A;   ENDOVASCULAR REPAIR/STENT GRAFT N/A 06/17/2022   Procedure: ENDOVASCULAR REPAIR/STENT GRAFT;  Surgeon: Annice Needy, MD;  Location: [facility] INVASIVE CV LAB;  Service:  Cardiovascular;  Laterality: N/A;   ICD IMPLANT N/A 11/03/2022   Procedure: ICD IMPLANT;  Surgeon: Lanier Prude, MD;  Location: [facility] INVASIVE CV LAB;  Service: Cardiovascular;  Laterality: N/A;   JOINT REPLACEMENT Right    RIGHT HEART CATH Right 09/21/2022  Procedure: RIGHT HEART CATH;  Surgeon: Laurey Morale, MD;  Location: [facility] INVASIVE CV LAB;  Service: Cardiovascular;  Laterality: Right;   RIGHT/LEFT HEART CATH AND CORONARY ANGIOGRAPHY N/A 08/02/2016   Procedure: Right/Left Heart Cath and Coronary Angiography;  Surgeon: Iran Ouch, MD;  Location: [facility] INVASIVE CV LAB;  Service: Cardiovascular;  Laterality: N/A;   TEE WITHOUT CARDIOVERSION N/A 06/23/2022   Procedure: TRANSESOPHAGEAL ECHOCARDIOGRAM;  Surgeon: Dorthula Nettles, DO;  Location: [facility] ORS;  Service: Cardiovascular;  Laterality: N/A;   TESTICLE SURGERY     Patient states that he had to have the tube fixed.    Social History   Socioeconomic History   Marital status: Single    Spouse name: Not on file   Number of children: Not on file   Years of education: Not on file   Highest education level: Not on file  Occupational History   Occupation: retired  Tobacco Use   Smoking status: Former    Current packs/day: 0.00    Types: Pipe, Cigarettes    Quit date: 12/27/2022   Smokeless tobacco: Never  Vaping Use   Vaping status: Never Used  Substance and Sexual Activity   Alcohol use: Yes    Alcohol/week: 2.0 standard drinks of alcohol    Types: 2 Cans of beer per week    Comment: 2 40oz beers/week   Drug use: No   Sexual activity: Not Currently  Other Topics Concern   Not on file  Social History Narrative   Not on file   Social Determinants of Health   Financial Resource Strain: Low Risk  (07/27/2022)   Overall Financial Resource Strain (CARDIA)    Difficulty of Paying Living Expenses: Not very hard  Food Insecurity: No Food Insecurity (07/27/2022)   Hunger Vital Sign    Worried About Running Out of Food  in the Last Year: Never true    Ran Out of Food in the Last Year: Never true  Transportation Needs: No Transportation Needs (07/27/2022)   PRAPARE - Administrator, Civil Service (Medical): No    Lack of Transportation (Non-Medical): No  Recent Concern: Transportation Needs - Unmet Transportation Needs (07/12/2022)   PRAPARE - Administrator, Civil Service (Medical): Yes    Lack of Transportation (Non-Medical): No  Physical Activity: Inactive (07/27/2022)   Exercise Vital Sign    Days of Exercise per Week: 0 days    Minutes of Exercise per Session: 0 min  Stress: No Stress Concern Present (07/27/2022)   [employer] of Occupational Health - Occupational Stress Questionnaire    Feeling of Stress : Not at all  Social Connections: Socially Isolated (07/27/2022)   Social Connection and Isolation Panel [NHANES]    Frequency of Communication with Friends and Family: More than three times a week    Frequency of Social Gatherings with Friends and Family: More than three times a week    Attends Religious Services: Never    Database administrator or Organizations: No    Attends Banker Meetings: Never    Marital Status: Divorced  Catering manager Violence: Not At Risk (07/27/2022)   Humiliation, Afraid, Rape, and Kick questionnaire    Fear of Current or Ex-Partner: No    Emotionally Abused: No    Physically Abused: No    Sexually Abused: No     No Known Allergies   CBC    Component Value Date/Time   WBC 4.8 10/18/2022 1438   RBC  4.69 10/18/2022 1438   HGB 13.9 10/18/2022 1438   HGB 14.1 06/29/2022 1605   HCT 41.7 10/18/2022 1438   HCT 41.1 06/29/2022 1605   PLT 161 10/18/2022 1438   PLT 275 06/29/2022 1605   MCV 88.9 10/18/2022 1438   MCV 85 06/29/2022 1605   MCH 29.6 10/18/2022 1438   MCHC 33.3 10/18/2022 1438   RDW 16.6 (H) 10/18/2022 1438   RDW 14.1 06/29/2022 1605   LYMPHSABS 0.7 06/29/2022 1605   MONOABS 0.7 05/14/2017 0351   EOSABS  0.1 06/29/2022 1605   BASOSABS 0.0 06/29/2022 1605    Pulmonary Functions Testing Results:    Latest Ref Rng & Units 07/22/2022    2:54 PM  PFT Results  FVC-Pre L 2.06   FVC-Predicted Pre % 43   FVC-Post L 2.47   FVC-Predicted Post % 52   Pre FEV1/FVC % % 46   Post FEV1/FCV % % 51   FEV1-Pre L 0.94   FEV1-Predicted Pre % 27   FEV1-Post L 1.25   DLCO uncorrected ml/min/mmHg 15.38   DLCO UNC% % 56   DLVA Predicted % 74   TLC L 7.05   TLC % Predicted % 94   RV % Predicted % 154     Outpatient Medications Prior to Visit  Medication Sig Dispense Refill   acetaminophen (TYLENOL) 500 MG tablet Take 500-1,000 mg by mouth every 6 (six) hours as needed for mild pain, fever or headache.      albuterol (VENTOLIN HFA) 108 (90 Base) MCG/ACT inhaler TAKE 2 PUFFS BY MOUTH EVERY 6 HOURS AS NEEDED FOR WHEEZE OR SHORTNESS OF BREATH 6.7 each 3   amiodarone (PACERONE) 200 MG tablet Take 1 tablet (200 mg total) by mouth daily. 90 tablet 6   apixaban (ELIQUIS) 5 MG TABS tablet Take 1 tablet (5 mg total) by mouth 2 (two) times daily. 180 tablet 0   atorvastatin (LIPITOR) 40 MG tablet TAKE 1 TABLET BY MOUTH EVERY DAY 90 tablet 1   digoxin (LANOXIN) 0.125 MG tablet Take 0.5 tablet daily. (0.0625 mg) 45 tablet 3   digoxin (LANOXIN) 0.125 MG tablet Take 1/2 tablet daily. 90 tablet 1   folic acid (FOLVITE) 1 MG tablet TAKE 1 TABLET BY MOUTH EVERY DAY 90 tablet 2   furosemide (LASIX) 40 MG tablet TAKE 1 TABLET BY MOUTH EVERY DAY 90 tablet 1   ibuprofen (ADVIL) 200 MG tablet Take 800 mg by mouth every 6 (six) hours as needed for moderate pain.     JARDIANCE 10 MG TABS tablet TAKE 1 TABLET BY MOUTH EVERY DAY BEFORE BREAKFAST 30 tablet 5   metFORMIN (GLUCOPHAGE) 500 MG tablet TAKE 1 TABLET BY MOUTH 2 TIMES DAILY WITH A MEAL. 180 tablet 1   metoprolol succinate (TOPROL-XL) 50 MG 24 hr tablet Take 1.5 tablets (75 mg total) by mouth daily. TAKE WITH OR IMMEDIATELY FOLLOWING A MEAL. 125 tablet 1   pantoprazole  (PROTONIX) 40 MG tablet TAKE 1 TABLET BY MOUTH TWICE A DAY 180 tablet 0   potassium chloride SA (KLOR-CON M) 20 MEQ tablet Take 20 mEq by mouth 2 (two) times daily.     prednisoLONE acetate (PRED FORTE) 1 % ophthalmic suspension 1 drop 4 (four) times daily. 5 mL 0   sacubitril-valsartan (ENTRESTO) 97-103 MG Take 1 tablet by mouth 2 (two) times daily. 60 tablet 6   spironolactone (ALDACTONE) 25 MG tablet TAKE 1 TABLET (25 MG TOTAL) BY MOUTH DAILY. 90 tablet 0   Tiotropium Bromide-Olodaterol (STIOLTO  RESPIMAT) 2.5-2.5 MCG/ACT AERS Inhale 2 puffs into the lungs daily. 60 each 12   No facility-administered medications prior to visit.

## 2023-01-04 ENCOUNTER — Telehealth (HOSPITAL_COMMUNITY): Payer: Self-pay | Admitting: *Deleted

## 2023-01-04 NOTE — Telephone Encounter (Signed)
Left voicemail to remind patient of CPX appointment on 8/7 at 1:30pm. Provided patient with call back [phone] if cancellation/reschedule is necessary.       Reggy Eye, MS, ACSM, [facility] Clinical Exercise Physiologist/ Health and Wellness Coach

## 2023-01-05 DIAGNOSIS — I509 Heart failure, unspecified: Secondary | ICD-10-CM

## 2023-01-05 DIAGNOSIS — F17211 Nicotine dependence, cigarettes, in remission: Secondary | ICD-10-CM | POA: Diagnosis not present

## 2023-01-05 DIAGNOSIS — Z7984 Long term (current) use of oral hypoglycemic drugs: Secondary | ICD-10-CM | POA: Diagnosis not present

## 2023-01-05 DIAGNOSIS — E1159 Type 2 diabetes mellitus with other circulatory complications: Secondary | ICD-10-CM | POA: Diagnosis not present

## 2023-01-10 ENCOUNTER — Encounter: Payer: Self-pay | Admitting: Family Medicine

## 2023-01-10 ENCOUNTER — Ambulatory Visit (INDEPENDENT_AMBULATORY_CARE_PROVIDER_SITE_OTHER): Payer: Medicare HMO | Admitting: Family Medicine

## 2023-01-10 VITALS — BP 136/88 | Temp 97.7°F | Wt 262.2 lb

## 2023-01-10 DIAGNOSIS — E1159 Type 2 diabetes mellitus with other circulatory complications: Secondary | ICD-10-CM | POA: Diagnosis not present

## 2023-01-10 DIAGNOSIS — E785 Hyperlipidemia, unspecified: Secondary | ICD-10-CM | POA: Diagnosis not present

## 2023-01-10 DIAGNOSIS — I10 Essential (primary) hypertension: Secondary | ICD-10-CM

## 2023-01-10 DIAGNOSIS — Z7984 Long term (current) use of oral hypoglycemic drugs: Secondary | ICD-10-CM | POA: Diagnosis not present

## 2023-01-10 LAB — BAYER DCA HB A1C WAIVED: HB A1C (BAYER DCA - WAIVED): 6.5 % — ABNORMAL HIGH (ref 4.8–5.6)

## 2023-01-10 MED ORDER — ALBUTEROL SULFATE HFA 108 (90 BASE) MCG/ACT IN AERS: 1.0000 | INHALATION_SPRAY | Freq: Four times a day (QID) | RESPIRATORY_TRACT | 3 refills | Status: DC | PRN

## 2023-01-10 MED ORDER — PANTOPRAZOLE SODIUM 40 MG PO TBEC: 40.0000 mg | DELAYED_RELEASE_TABLET | Freq: Two times a day (BID) | ORAL | 0 refills | Status: DC

## 2023-01-10 NOTE — Assessment & Plan Note (Signed)
Under good control on current regimen. Continue current regimen. Continue to monitor. Call with any concerns. Refills through cardiology. Labs drawn today. 

## 2023-01-10 NOTE — Progress Notes (Signed)
BP 136/88   Temp 97.7 F (36.5 C) (Oral)   Wt 262 lb 3.2 oz (118.9 kg)   SpO2 94%   BMI 34.59 kg/m    Subjective:    Patient ID: Eugene Henderson, male    DOB: 14-May-1951, 72 y.o.   MRN: [id number]  HPI: [name] is a 72 y.o. male  Chief Complaint  Patient presents with   Diabetes   HYPERTENSION / HYPERLIPIDEMIA Satisfied with current treatment? yes Duration of hypertension: chronic BP monitoring frequency: not checking BP medication side effects: no Past BP meds: metoprolol, spironalactone, lasix, entresto, digoxin Duration of hyperlipidemia: chronic Cholesterol medication side effects: no Cholesterol supplements: none Past cholesterol medications: atorvastatin Medication compliance: excellent compliance Aspirin: no Recent stressors: no Recurrent headaches: no Visual changes: no Palpitations: no Dyspnea: no Chest pain: no Lower extremity edema: no Dizzy/lightheaded: no  DIABETES Hypoglycemic episodes:yes Polydipsia/polyuria: no Visual disturbance: no Chest pain: no Paresthesias: no Glucose Monitoring: yes  Accucheck frequency: Daily Taking Insulin?: no Blood Pressure Monitoring: not checking Retinal Examination: Up to Date Foot Exam: Up to Date Diabetic Education: Completed Pneumovax: Up to Date Influenza: Not up to Date Aspirin: no  Relevant past medical, surgical, family and social history reviewed and updated as indicated. Interim medical history since our last visit reviewed. Allergies and medications reviewed and updated.  Review of Systems  Constitutional: Negative.   Respiratory: Negative.    Cardiovascular: Negative.   Gastrointestinal: Negative.   Musculoskeletal: Negative.   Neurological: Negative.     Per HPI unless specifically indicated above     Objective:    BP 136/88   Temp 97.7 F (36.5 C) (Oral)   Wt 262 lb 3.2 oz (118.9 kg)   SpO2 94%   BMI 34.59 kg/m   Wt Readings from Last 3 Encounters:  01/10/23 262 lb 3.2  oz (118.9 kg)  12/29/22 262 lb (118.8 kg)  12/15/22 264 lb (119.7 kg)    Physical Exam Vitals and nursing note reviewed.  Constitutional:      General: He is not in acute distress.    Appearance: Normal appearance. He is obese. He is not ill-appearing, toxic-appearing or diaphoretic.  HENT:     Head: Normocephalic and atraumatic.     Right Ear: External ear normal.     Left Ear: External ear normal.     Nose: Nose normal.     Mouth/Throat:     Mouth: Mucous membranes are moist.     Pharynx: Oropharynx is clear.  Eyes:     General: No scleral icterus.       Right eye: No discharge.        Left eye: No discharge.     Extraocular Movements: Extraocular movements intact.     Conjunctiva/sclera: Conjunctivae normal.     Pupils: Pupils are equal, round, and reactive to light.  Cardiovascular:     Rate and Rhythm: Normal rate and regular rhythm.     Pulses: Normal pulses.     Heart sounds: Normal heart sounds. No murmur heard.    No friction rub. No gallop.  Pulmonary:     Effort: Pulmonary effort is normal. No respiratory distress.     Breath sounds: Normal breath sounds. No stridor. No wheezing, rhonchi or rales.  Chest:     Chest wall: No tenderness.  Musculoskeletal:        General: Normal range of motion.     Cervical back: Normal range of motion and neck supple.  Skin:  General: Skin is warm and dry.     Capillary Refill: Capillary refill takes less than 2 seconds.     Coloration: Skin is not jaundiced or pale.     Findings: No bruising, erythema, lesion or rash.  Neurological:     General: No focal deficit present.     Mental Status: He is alert and oriented to person, place, and time. Mental status is at baseline.  Psychiatric:        Mood and Affect: Mood normal.        Behavior: Behavior normal.        Thought Content: Thought content normal.        Judgment: Judgment normal.     Results for orders placed or performed during the hospital encounter of 12/24/22   Basic Metabolic Panel (BMET)  Result Value Ref Range   Sodium 134 (L) 135 - 145 mmol/L   Potassium 4.1 3.5 - 5.1 mmol/L   Chloride 101 98 - 111 mmol/L   CO2 22 22 - 32 mmol/L   Glucose, Bld 95 70 - 99 mg/dL   BUN 28 (H) 8 - 23 mg/dL   Creatinine, Ser 7.56 0.61 - 1.24 mg/dL   Calcium 9.2 8.9 - 43.3 mg/dL   GFR, Estimated >29 >51 mL/min   Anion gap 11 5 - 15      Assessment & Plan:   Problem List Items Addressed This Visit       Cardiovascular and Mediastinum   Essential hypertension    Under good control on current regimen. Continue current regimen. Continue to monitor. Call with any concerns. Refills through cardiology. Labs drawn today.       Diabetes mellitus with cardiac complication (HCC) - Primary    Doing well with A1c of 6.5 down from 6.7. Continue current regimen. Continue to monitor. Call with any concerns.       Relevant Orders   CBC with Differential/Platelet   Lipid Panel w/o Chol/HDL Ratio   Comprehensive metabolic panel   Bayer DCA Hb O8C Waived     Other   Hyperlipidemia    Under good control on current regimen. Continue current regimen. Continue to monitor. Call with any concerns. Refills through cardiology. Labs drawn today.        Follow up plan: Return in about 3 months (around 04/12/2023).

## 2023-01-10 NOTE — Assessment & Plan Note (Signed)
Doing well with A1c of 6.5 down from 6.7. Continue current regimen. Continue to monitor. Call with any concerns.

## 2023-01-12 ENCOUNTER — Ambulatory Visit (HOSPITAL_COMMUNITY): Payer: Medicare HMO | Attending: Cardiology

## 2023-01-12 ENCOUNTER — Encounter (HOSPITAL_COMMUNITY): Payer: Self-pay | Admitting: *Deleted

## 2023-01-12 ENCOUNTER — Telehealth: Payer: Self-pay

## 2023-01-12 NOTE — Telephone Encounter (Signed)
Bio Acticor ICD (single chamber w/DX lead). We continue to receive daily atrial monitoring alerts that show AF and noise on A channel.  Device also flagging periods of NST events that all appear AF w/ RVR.    LM for patient to call back with symptoms.  Daughter Ursula Alert) says patient has not been feeling well.      ATRIAL MONITORING EVENT:             DEVICE FLAGGED NST EVENT:  (suspect AFIB with RVR )

## 2023-01-12 NOTE — Progress Notes (Signed)
Patient arrived for CPX and stated his back hurt and that today was not a good day. Elaborated to say he was "miserable" because he was "hungover" from last night. He would rather try another day. With chart review- EP was trying to reach him for multiple atrial alerts showing AF and noise flagging NST and AF with RVR.   Vitals upon arrival for CPX:  HR: 89 bpm BP: 106/64 O2: 98%  Consulted with AHF clinic staff nurse and all agreeable for CPX postponed due to all of the above and sub-optimal testing condition. Following up with EP before rescheduling CPX.     Reggy Eye, MS, ACSM, [facility] Clinical Exercise Physiologist/ Health and Wellness Coach

## 2023-01-13 NOTE — Telephone Encounter (Signed)
Patient called and reports of shortness of breath and palpitations for several days. Patient reports compliance to medications including Eliquis 5 mg and Toprol-XL 75 mg daily.   Recommend patient see AF clinic to discuss further. Patient agreeable. Please call daughters cell phone per patient, states his phone is messed up.

## 2023-01-15 ENCOUNTER — Other Ambulatory Visit: Payer: Self-pay | Admitting: Family Medicine

## 2023-01-16 NOTE — Telephone Encounter (Signed)
Note made in apt note.

## 2023-01-17 NOTE — Telephone Encounter (Signed)
Requested Prescriptions  Pending Prescriptions Disp Refills   spironolactone (ALDACTONE) 25 MG tablet [Pharmacy Med Name: SPIRONOLACTONE 25 MG TABLET] 90 tablet 0    Sig: TAKE 1 TABLET (25 MG TOTAL) BY MOUTH DAILY.     Cardiovascular: Diuretics - Aldosterone Antagonist Passed - 01/15/2023  8:46 AM      Passed - Cr in normal range and within 180 days    Creatinine, Ser  Date Value Ref Range Status  01/10/2023 0.98 0.76 - 1.27 mg/dL Final         Passed - K in normal range and within 180 days    Potassium  Date Value Ref Range Status  01/10/2023 4.5 3.5 - 5.2 mmol/L Final         Passed - Na in normal range and within 180 days    Sodium  Date Value Ref Range Status  01/10/2023 138 134 - 144 mmol/L Final         Passed - eGFR is 30 or above and within 180 days    GFR calc Af Amer  Date Value Ref Range Status  03/28/2020 96 >59 mL/min/1.73 Final    Comment:    **In accordance with recommendations from the NKF-ASN Task force,**   [facility] is in the process of updating its eGFR calculation to the   2021 CKD-EPI creatinine equation that estimates kidney function   without a race variable.    GFR, Estimated  Date Value Ref Range Status  12/24/2022 >60 >60 mL/min Final    Comment:    (NOTE) Calculated using the CKD-EPI Creatinine Equation (2021)    eGFR  Date Value Ref Range Status  01/10/2023 82 >59 mL/min/1.73 Final         Passed - Last BP in normal range    BP Readings from Last 1 Encounters:  01/10/23 136/88         Passed - Valid encounter within last 6 months    Recent Outpatient Visits           1 week ago Diabetes mellitus with cardiac complication [facility])   [facility] [place], Megan P, DO   3 months ago Diabetes mellitus with cardiac complication [facility])   [facility] [facility] [place], Megan P, DO   4 months ago Post-traumatic osteoarthritis of right knee   [place] [facility] [place], Megan P, DO    5 months ago Encounter for annual wellness exam in Medicare patient   [place] [facility] [place], Megan P, DO   6 months ago Acute gastritis, presence of bleeding unspecified, unspecified gastritis type   [facility] Dorcas Carrow, DO       Future Appointments             In 2 months Laural Benes, Oralia Rud, DO  [facility], [facility]

## 2023-01-26 NOTE — Telephone Encounter (Signed)
Pt has appt with lambert already scheduled for 9/4. AF clinic has been unable to reach at this time will continue attempt.

## 2023-02-02 ENCOUNTER — Ambulatory Visit (INDEPENDENT_AMBULATORY_CARE_PROVIDER_SITE_OTHER): Payer: Medicare HMO

## 2023-02-02 ENCOUNTER — Encounter (INDEPENDENT_AMBULATORY_CARE_PROVIDER_SITE_OTHER): Payer: Self-pay

## 2023-02-02 ENCOUNTER — Ambulatory Visit (INDEPENDENT_AMBULATORY_CARE_PROVIDER_SITE_OTHER): Payer: Medicare HMO | Admitting: Nurse Practitioner

## 2023-02-02 ENCOUNTER — Encounter (INDEPENDENT_AMBULATORY_CARE_PROVIDER_SITE_OTHER): Payer: Self-pay | Admitting: Nurse Practitioner

## 2023-02-02 VITALS — BP 101/70 | HR 71 | Resp 18 | Ht 76.0 in | Wt 262.0 lb

## 2023-02-02 DIAGNOSIS — I10 Essential (primary) hypertension: Secondary | ICD-10-CM

## 2023-02-02 DIAGNOSIS — E785 Hyperlipidemia, unspecified: Secondary | ICD-10-CM

## 2023-02-02 DIAGNOSIS — I700 Atherosclerosis of aorta: Secondary | ICD-10-CM

## 2023-02-02 DIAGNOSIS — I714 Abdominal aortic aneurysm, without rupture, unspecified: Secondary | ICD-10-CM

## 2023-02-02 DIAGNOSIS — B351 Tinea unguium: Secondary | ICD-10-CM

## 2023-02-04 ENCOUNTER — Ambulatory Visit (INDEPENDENT_AMBULATORY_CARE_PROVIDER_SITE_OTHER): Payer: Medicare HMO

## 2023-02-04 DIAGNOSIS — I428 Other cardiomyopathies: Secondary | ICD-10-CM

## 2023-02-04 DIAGNOSIS — I5022 Chronic systolic (congestive) heart failure: Secondary | ICD-10-CM

## 2023-02-04 LAB — CUP PACEART REMOTE DEVICE CHECK
Date Time Interrogation Session: 20240830100620
Implantable Lead Connection Status: 753985
Implantable Lead Implant Date: 20240529
Implantable Lead Location: 753860
Implantable Lead Model: 436909
Implantable Lead Serial Number: 81545001
Implantable Pulse Generator Implant Date: 20240529
Pulse Gen Model: 429525
Pulse Gen Serial Number: 84937644

## 2023-02-04 NOTE — Progress Notes (Signed)
Subjective:    Patient ID: Eugene Henderson, male    DOB: June 28, 1950, 72 y.o.   MRN: [id number] Chief Complaint  Patient presents with   Follow-up    f/u in 6 months with abi and aorta-iliac duplex -    Eugene Henderson is a 72 year old male who presents today after undergoing endovascular aortic repair on 06/17/2022.  He had an abdominal aortic aneurysm with bilateral iliac artery aneurysms.  Since that time the patient notes that he has issues with walking more so related to his knee has arthritic issues.  He hopes to have upcoming surgery on his right knee soon.  The patient was scheduled to have an EVAR today however he ate and due to bowel gas it is and body habitus we are unable to do complete visualization of his EVAR but there has not been any residual sac visible today.  Denies any significant claudication symptoms or rest pain.  His largest concern is of fungus on his right toe but this is not painful or showing any signs of gangrenous tissue.  Today the patient has a right ABI of 1.40 with TBI 0.74 and a left ABI 1.18 with a TBI of 0.58.  He has biphasic/triphasic waveforms on the right with monophasic/biphasic in the left.    Review of Systems  Musculoskeletal:  Positive for arthralgias.  Neurological:  Positive for weakness.  All other systems reviewed and are negative.      Objective:   Physical Exam Vitals reviewed.  HENT:     Head: Normocephalic.  Cardiovascular:     Rate and Rhythm: Normal rate.     Pulses:          Dorsalis pedis pulses are detected w/ Doppler on the right side and detected w/ Doppler on the left side.       Posterior tibial pulses are detected w/ Doppler on the right side and detected w/ Doppler on the left side.  Pulmonary:     Effort: Pulmonary effort is normal.  Skin:    General: Skin is warm and dry.  Neurological:     Mental Status: He is alert and oriented to person, place, and time.     Gait: Gait abnormal.  Psychiatric:        Mood and  Affect: Mood normal.        Behavior: Behavior normal.        Thought Content: Thought content normal.        Judgment: Judgment normal.     BP 101/70 (BP Location: Left Arm)   Pulse 71   Resp 18   Ht 6\' 4"  (1.93 m)   Wt 262 lb (118.8 kg)   BMI 31.89 kg/m   Past Medical History:  Diagnosis Date   Arthritis    Ascending aortic aneurysm (HCC)    a. 09/2017 Stable TAA - 5.1cm.   Asthma    Chronic systolic CHF (congestive heart failure) (HCC)    a. EF 25-30% by echo in 07/2016 with cath showing no significant CAD b. 01/2017: EF 30-35% with diffuse HK and moderate MR; c. 07/2017 Echo: EF 30-35%, diff hK. Mild MR, mildly dil LA. PASP .   Diverticulitis    Diverticulitis of large intestine with perforation without abscess or bleeding 05/13/2017   Hypertension    Hyperthyroidism    NICM (nonischemic cardiomyopathy) (HCC)    Noncompliance    Persistent atrial fibrillation (HCC)    a. CHA2DS2VASc = 3-->Eliquis (? compliance).    Social  History   Socioeconomic History   Marital status: Single    Spouse name: Not on file   Number of children: Not on file   Years of education: Not on file   Highest education level: Not on file  Occupational History   Occupation: retired  Tobacco Use   Smoking status: Former    Current packs/day: 0.00    Types: Pipe, Cigarettes    Quit date: 12/27/2022    Years since quitting: 0.1   Smokeless tobacco: Never  Vaping Use   Vaping status: Never Used  Substance and Sexual Activity   Alcohol use: Yes    Alcohol/week: 2.0 standard drinks of alcohol    Types: 2 Cans of beer per week    Comment: 2 40oz beers/week   Drug use: No   Sexual activity: Not Currently  Other Topics Concern   Not on file  Social History Narrative   Not on file   Social Determinants of Health   Financial Resource Strain: Low Risk  (07/27/2022)   Overall Financial Resource Strain (CARDIA)    Difficulty of Paying Living Expenses: Not very hard  Food Insecurity: No  Food Insecurity (07/27/2022)   Hunger Vital Sign    Worried About Running Out of Food in the Last Year: Never true    Ran Out of Food in the Last Year: Never true  Transportation Needs: No Transportation Needs (07/27/2022)   PRAPARE - Administrator, Civil Service (Medical): No    Lack of Transportation (Non-Medical): No  Recent Concern: Transportation Needs - Unmet Transportation Needs (07/12/2022)   PRAPARE - Administrator, Civil Service (Medical): Yes    Lack of Transportation (Non-Medical): No  Physical Activity: Inactive (07/27/2022)   Exercise Vital Sign    Days of Exercise per Week: 0 days    Minutes of Exercise per Session: 0 min  Stress: No Stress Concern Present (07/27/2022)   [employer] of Occupational Health - Occupational Stress Questionnaire    Feeling of Stress : Not at all  Social Connections: Socially Isolated (07/27/2022)   Social Connection and Isolation Panel [NHANES]    Frequency of Communication with Friends and Family: More than three times a week    Frequency of Social Gatherings with Friends and Family: More than three times a week    Attends Religious Services: Never    Database administrator or Organizations: No    Attends Banker Meetings: Never    Marital Status: Divorced  Catering manager Violence: Not At Risk (07/27/2022)   Humiliation, Afraid, Rape, and Kick questionnaire    Fear of Current or Ex-Partner: No    Emotionally Abused: No    Physically Abused: No    Sexually Abused: No    Past Surgical History:  Procedure Laterality Date   CARDIOVERSION N/A 06/23/2022   Procedure: CARDIOVERSION;  Surgeon: Dorthula Nettles, DO;  Location: [facility] ORS;  Service: Cardiovascular;  Laterality: N/A;   CARDIOVERSION N/A 06/25/2022   Procedure: CARDIOVERSION;  Surgeon: Dorthula Nettles, DO;  Location: [facility] ORS;  Service: Cardiovascular;  Laterality: N/A;   CARDIOVERSION N/A 09/29/2022   Procedure: CARDIOVERSION;  Surgeon:  Laurey Morale, MD;  Location: [facility] INVASIVE CV LAB;  Service: Cardiovascular;  Laterality: N/A;   COLONOSCOPY N/A 08/27/2019   Procedure: COLONOSCOPY;  Surgeon: Toledo, Boykin Nearing, MD;  Location: [facility] ENDOSCOPY;  Service: Gastroenterology;  Laterality: N/A;   ENDOVASCULAR REPAIR/STENT GRAFT N/A 06/17/2022   Procedure: ENDOVASCULAR REPAIR/STENT GRAFT;  Surgeon:  Annice Needy, MD;  Location: [facility] INVASIVE CV LAB;  Service: Cardiovascular;  Laterality: N/A;   ICD IMPLANT N/A 11/03/2022   Procedure: ICD IMPLANT;  Surgeon: Lanier Prude, MD;  [facility] INVAS[facility] CV LAB;  Service: Cardiovascular;  Laterality: N/A;   JOINT REPLACEMENT Right    RIGHT HEART CATH Right 09/21/2022   Procedure: RIGHT HEART CATH;  Surgeon: Laurey Morale, MD;  Location: [facility] INVASIVE CV LAB;  Service: Cardiovascular;  Laterality: Right;   RIGHT/LEFT HEART CATH AND CORONARY ANGIOGRAPHY N/A 08/02/2016   Procedure: Right/Left Heart Cath and Coronary Angiography;  Surgeon: Iran Ouch, MD;  Location: [facility] INVASIVE CV LAB;  Service: Cardiovascular;  Laterality: N/A;   TEE WITHOUT CARDIOVERSION N/A 06/23/2022   Procedure: TRANSESOPHAGEAL ECHOCARDIOGRAM;  Surgeon: Dorthula Nettles, DO;  Location: [facility] ORS;  Service: Cardiovascular;  Laterality: N/A;   TESTICLE SURGERY     Patient states that he had to have the tube fixed.    Family History  Problem Relation Age of Onset   Diabetes Mother    Diabetes Father    Heart disease Brother    Heart attack Brother    Diabetes Brother    Kidney failure Brother    Thyroid disease Neg Hx     No Known Allergies     Latest Ref Rng & Units 01/10/2023   10:17 AM 10/18/2022    2:38 PM 09/21/2022    2:39 PM  CBC  WBC 3.4 - 10.8 x10E3/uL 4.5  4.8    Hemoglobin 13.0 - 17.7 g/dL 96.2  95.2  84.1    32.4   Hematocrit 37.5 - 51.0 % 42.9  41.7  40.0    40.0   Platelets 150 - 450 x10E3/uL 170  161        CMP     Component Value Date/Time   NA 138 01/10/2023 1017   K 4.5  01/10/2023 1017   CL 101 01/10/2023 1017   CO2 21 01/10/2023 1017   GLUCOSE 92 01/10/2023 1017   GLUCOSE 95 12/24/2022 1252   BUN 26 01/10/2023 1017   CREATININE 0.98 01/10/2023 1017   CALCIUM 9.3 01/10/2023 1017   PROT 7.7 01/10/2023 1017   ALBUMIN 4.6 01/10/2023 1017   AST 13 01/10/2023 1017   ALT 11 01/10/2023 1017   ALKPHOS 119 01/10/2023 1017   BILITOT 0.6 01/10/2023 1017   EGFR 82 01/10/2023 1017   GFRNONAA >60 12/24/2022 1252     No results found.     Assessment & Plan:   1. Toenail fungus The patient has a toenail fungus and is concerned about it on his right foot.  He would like to potentially have the toenail removed.  Will refer the patient to podiatry for evaluation - Ambulatory referral to Podiatry  2. Abdominal aortic aneurysm (AAA) without rupture, unspecified part (HCC) Today we were unable to perform EVAR due to the patient eating prior to his study however limited imaging shows that residual sac was not visible which suggest that there has not been any growth post EVAR.  Will plan on having the patient return in 3 months to try to obtain an EVAR  3. Essential hypertension Continue antihypertensive medications as already ordered, these medications have been reviewed and there are no changes at this time.  4. Hyperlipidemia, unspecified hyperlipidemia type Continue statin as ordered and reviewed, no changes at this time  5. Aortic atherosclerosis (HCC)  Recommend:  The patient has evidence of atherosclerosis of the lower extremities  with no claudication.  The patient does not voice lifestyle limiting changes at this point in time.  Noninvasive studies do not suggest clinically significant change.  No invasive studies, angiography or surgery at this time The patient should continue walking and begin a more formal exercise program.  The patient should continue antiplatelet therapy and aggressive treatment of the lipid abnormalities  No changes in the  patient's medications at this time  Continued surveillance is indicated as atherosclerosis is likely to progress with time.    The patient will continue follow up with noninvasive studies as ordered.    Current Outpatient Medications on File Prior to Visit  Medication Sig Dispense Refill   acetaminophen (TYLENOL) 500 MG tablet Take 500-1,000 mg by mouth every 6 (six) hours as needed for mild pain, fever or headache.      albuterol (VENTOLIN HFA) 108 (90 Base) MCG/ACT inhaler Inhale 1-2 puffs into the lungs every 6 (six) hours as needed for wheezing or shortness of breath. 6.7 each 3   amiodarone (PACERONE) 200 MG tablet Take 1 tablet (200 mg total) by mouth daily. 90 tablet 6   apixaban (ELIQUIS) 5 MG TABS tablet Take 1 tablet (5 mg total) by mouth 2 (two) times daily. 180 tablet 0   atorvastatin (LIPITOR) 40 MG tablet TAKE 1 TABLET BY MOUTH EVERY DAY 90 tablet 1   digoxin (LANOXIN) 0.125 MG tablet Take 0.5 tablet daily. (0.0625 mg) 45 tablet 3   digoxin (LANOXIN) 0.125 MG tablet Take 1/2 tablet daily. 90 tablet 1   folic acid (FOLVITE) 1 MG tablet TAKE 1 TABLET BY MOUTH EVERY DAY 90 tablet 2   furosemide (LASIX) 40 MG tablet TAKE 1 TABLET BY MOUTH EVERY DAY 90 tablet 1   ibuprofen (ADVIL) 200 MG tablet Take 800 mg by mouth every 6 (six) hours as needed for moderate pain.     JARDIANCE 10 MG TABS tablet TAKE 1 TABLET BY MOUTH EVERY DAY BEFORE BREAKFAST 30 tablet 5   metFORMIN (GLUCOPHAGE) 500 MG tablet TAKE 1 TABLET BY MOUTH 2 TIMES DAILY WITH A MEAL. 180 tablet 1   metoprolol succinate (TOPROL-XL) 50 MG 24 hr tablet Take 1.5 tablets (75 mg total) by mouth daily. TAKE WITH OR IMMEDIATELY FOLLOWING A MEAL. 125 tablet 1   pantoprazole (PROTONIX) 40 MG tablet Take 1 tablet (40 mg total) by mouth 2 (two) times daily. 180 tablet 0   potassium chloride SA (KLOR-CON M) 20 MEQ tablet Take 20 mEq by mouth 2 (two) times daily.     prednisoLONE acetate (PRED FORTE) 1 % ophthalmic suspension 1 drop 4  (four) times daily. 5 mL 0   sacubitril-valsartan (ENTRESTO) 97-103 MG Take 1 tablet by mouth 2 (two) times daily. 60 tablet 6   spironolactone (ALDACTONE) 25 MG tablet TAKE 1 TABLET (25 MG TOTAL) BY MOUTH DAILY. 90 tablet 0   Tiotropium Bromide-Olodaterol (STIOLTO RESPIMAT) 2.5-2.5 MCG/ACT AERS Inhale 2 puffs into the lungs daily. 4 g 11   No current facility-administered medications on file prior to visit.    There are no Patient Instructions on file for this visit. No follow-ups on file.   Georgiana Spinner, NP

## 2023-02-08 NOTE — Progress Notes (Signed)
Remote ICD transmission.   

## 2023-02-09 ENCOUNTER — Ambulatory Visit: Payer: Medicare HMO | Attending: Cardiology | Admitting: Cardiology

## 2023-02-09 ENCOUNTER — Encounter: Payer: Self-pay | Admitting: Cardiology

## 2023-02-09 VITALS — BP 100/70 | HR 80 | Ht 73.0 in | Wt 269.2 lb

## 2023-02-09 DIAGNOSIS — I5022 Chronic systolic (congestive) heart failure: Secondary | ICD-10-CM | POA: Diagnosis not present

## 2023-02-09 DIAGNOSIS — Z95810 Presence of automatic (implantable) cardiac defibrillator: Secondary | ICD-10-CM | POA: Diagnosis not present

## 2023-02-09 DIAGNOSIS — I4821 Permanent atrial fibrillation: Secondary | ICD-10-CM | POA: Diagnosis not present

## 2023-02-09 LAB — CUP PACEART INCLINIC DEVICE CHECK
Date Time Interrogation Session: 20240904120548
Implantable Lead Connection Status: 753985
Implantable Lead Implant Date: 20240529
Implantable Lead Location: 753860
Implantable Lead Model: 436909
Implantable Lead Serial Number: 81545001
Implantable Pulse Generator Implant Date: 20240529
Pulse Gen Model: 429525
Pulse Gen Serial Number: 84937644

## 2023-02-09 MED ORDER — AMIODARONE HCL 100 MG PO TABS
100.0000 mg | ORAL_TABLET | Freq: Every day | ORAL | 3 refills | Status: DC
Start: 2023-02-09 — End: 2023-02-15

## 2023-02-09 NOTE — Progress Notes (Signed)
  Electrophysiology Office Follow up Visit Note:    Date:  02/09/2023   ID:  Eugene Henderson, DOB Mar 06, 1951, MRN [id number]  PCP:  Dorcas Carrow, DO  [facility] Cardiologist:  Lorine Bears, MD  [facility] Electrophysiologist:  Lanier Prude, MD    Interval History:    Eugene Henderson is a 72 y.o. male who presents for a follow up visit.   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  He has done well since the implant remote patient since implant have shown stable device function.  He has not persistent atrial fibrillation with controlled ventricular rates.  He takes Eliquis for stroke prophylaxis.  Ventricular rate histograms have shown average heart rates between 50 and 90 while in atrial fibrillation.  Today he is doing well.  His shoulder has healed up well.     Past medical, surgical, social and family history were reviewed.  ROS:   Please see the history of present illness.    All other systems reviewed and are negative.  EKGs/Labs/Other Studies Reviewed:    The following studies were reviewed today:  February 09, 2023 in clinic device interrogation personally reviewed Device and lead parameter stable.  The device was under sensing his atrial fibrillation so I reduced the sensitivity threshold on his RA lead to more accurately capture his A-fib burden which I suspect is 100%.  This is also leading to inappropriate counting of PVCs.       Physical Exam:    VS:  BP 100/70 (BP Location: Left Arm, Patient Position: Sitting, Cuff Size: Normal)   Pulse 80   Ht 6\' 1"  (1.854 m)   Wt 269 lb 4 oz (122.1 kg)   SpO2 98%   BMI 35.52 kg/m     Wt Readings from Last 3 Encounters:  02/09/23 269 lb 4 oz (122.1 kg)  02/02/23 262 lb (118.8 kg)  01/10/23 262 lb 3.2 oz (118.9 kg)     GEN:  Well nourished, well developed in no acute distress.  Obese.  In wheelchair. CARDIAC: Irregularly irregular, no murmurs, rubs,  gallops RESPIRATORY:  Clear to auscultation without rales, wheezing or rhonchi       ASSESSMENT:    1. Permanent atrial fibrillation (HCC)   2. Chronic systolic heart failure (HCC)   3. ICD (implantable cardioverter-defibrillator) in place    PLAN:    In order of problems listed above:  #Chronic systolic heart failure #ICD in situ NYHA class II today.  Warm and dry on exam.  Follows with Dr. Shirlee Latch in the heart failure clinic. Continue Lasix, Jardiance, metoprolol, Entresto, spironolactone.  ICD functioning appropriately.  Continue remote monitoring.  #Permanent atrial fibrillation On Eliquis for stroke prophylaxis On amiodarone currently.  Using amiodarone for assistance with rate control given his history of A-fib with RVR.  Reduce amiodarone dose to 100 mg by mouth once daily.  Will need every 6 month checks of liver and thyroid function.   Follow-up 6 months with APP.  Check CMP, TSH and free T4 at that appointment.   Signed, Steffanie Dunn, MD, [facility], [facility] 02/09/2023 11:42 AM    Electrophysiology [facility]

## 2023-02-09 NOTE — Patient Instructions (Signed)
 Medication Instructions:  Your physician has recommended you make the following change in your medication:  1) DECREASE amiodarone to 100 mg daily *If you need a refill on your cardiac medications before your next appointment, please call your pharmacy*  Follow-Up: At [facility], you and your health needs are our priority.  As part of our continuing mission to provide you with exceptional heart care, we have created designated Provider Care Teams.  These Care Teams include your primary Cardiologist (physician) and Advanced Practice Providers (APPs -  Physician Assistants and Nurse Practitioners) who all work together to provide you with the care you need, when you need it.  Your next appointment:   6 month(s)  Provider:   Sherie Don, NP

## 2023-02-10 LAB — VAS US ABI WITH/WO TBI
Left ABI: 1.18
Right ABI: 1.4

## 2023-02-15 ENCOUNTER — Encounter: Payer: Self-pay | Admitting: Cardiology

## 2023-02-15 ENCOUNTER — Ambulatory Visit (HOSPITAL_BASED_OUTPATIENT_CLINIC_OR_DEPARTMENT_OTHER): Payer: Medicare HMO | Admitting: Cardiology

## 2023-02-15 ENCOUNTER — Other Ambulatory Visit
Admission: RE | Admit: 2023-02-15 | Discharge: 2023-02-15 | Disposition: A | Discharge status: Home / Self Care | Payer: Medicare HMO | Source: Ambulatory Visit | Attending: Cardiology | Admitting: Cardiology

## 2023-02-15 VITALS — BP 130/90 | HR 82 | Resp 16 | Wt 279.0 lb

## 2023-02-15 DIAGNOSIS — I712 Thoracic aortic aneurysm, without rupture, unspecified: Secondary | ICD-10-CM | POA: Insufficient documentation

## 2023-02-15 DIAGNOSIS — Z7901 Long term (current) use of anticoagulants: Secondary | ICD-10-CM | POA: Insufficient documentation

## 2023-02-15 DIAGNOSIS — Z79899 Other long term (current) drug therapy: Secondary | ICD-10-CM | POA: Insufficient documentation

## 2023-02-15 DIAGNOSIS — Z7984 Long term (current) use of oral hypoglycemic drugs: Secondary | ICD-10-CM | POA: Diagnosis not present

## 2023-02-15 DIAGNOSIS — I5022 Chronic systolic (congestive) heart failure: Secondary | ICD-10-CM | POA: Insufficient documentation

## 2023-02-15 DIAGNOSIS — I428 Other cardiomyopathies: Secondary | ICD-10-CM | POA: Diagnosis not present

## 2023-02-15 DIAGNOSIS — J449 Chronic obstructive pulmonary disease, unspecified: Secondary | ICD-10-CM | POA: Insufficient documentation

## 2023-02-15 DIAGNOSIS — E119 Type 2 diabetes mellitus without complications: Secondary | ICD-10-CM | POA: Diagnosis not present

## 2023-02-15 DIAGNOSIS — I4819 Other persistent atrial fibrillation: Secondary | ICD-10-CM

## 2023-02-15 DIAGNOSIS — I110 Hypertensive heart disease with heart failure: Secondary | ICD-10-CM | POA: Insufficient documentation

## 2023-02-15 DIAGNOSIS — I4821 Permanent atrial fibrillation: Secondary | ICD-10-CM | POA: Insufficient documentation

## 2023-02-15 DIAGNOSIS — F17210 Nicotine dependence, cigarettes, uncomplicated: Secondary | ICD-10-CM | POA: Diagnosis not present

## 2023-02-15 DIAGNOSIS — I714 Abdominal aortic aneurysm, without rupture, unspecified: Secondary | ICD-10-CM | POA: Insufficient documentation

## 2023-02-15 DIAGNOSIS — R0602 Shortness of breath: Secondary | ICD-10-CM | POA: Diagnosis not present

## 2023-02-15 LAB — COMPREHENSIVE METABOLIC PANEL
ALT: 12 U/L (ref 0–44)
AST: 15 U/L (ref 15–41)
Albumin: 4 g/dL (ref 3.5–5.0)
Alkaline Phosphatase: 81 U/L (ref 38–126)
Anion gap: 7 (ref 5–15)
BUN: 15 mg/dL (ref 8–23)
CO2: 24 mmol/L (ref 22–32)
Calcium: 8.5 mg/dL — ABNORMAL LOW (ref 8.9–10.3)
Chloride: 105 mmol/L (ref 98–111)
Creatinine, Ser: 0.8 mg/dL (ref 0.61–1.24)
GFR, Estimated: >60 mL/min (ref >60)
Glucose, Bld: 115 mg/dL — ABNORMAL HIGH (ref 70–99)
Potassium: 4.1 mmol/L (ref 3.5–5.1)
Sodium: 136 mmol/L (ref 135–145)
Total Bilirubin: 0.7 mg/dL (ref 0.3–1.2)
Total Protein: 7 g/dL (ref 6.5–8.1)

## 2023-02-15 LAB — CBC
HCT: 38.9 % — ABNORMAL LOW (ref 39.0–52.0)
Hemoglobin: 12.7 g/dL — ABNORMAL LOW (ref 13.0–17.0)
MCH: 29.7 pg (ref 26.0–34.0)
MCHC: 32.6 g/dL (ref 30.0–36.0)
MCV: 91.1 fL (ref 80.0–100.0)
Platelets: 150 10*3/uL (ref 150–400)
RBC: 4.27 MIL/uL (ref 4.22–5.81)
RDW: 14.4 % (ref 11.5–15.5)
WBC: 3.4 10*3/uL — ABNORMAL LOW (ref 4.0–10.5)
nRBC: 0 % (ref 0.0–0.2)

## 2023-02-15 LAB — TSH: TSH: 4.427 u[IU]/mL (ref 0.350–4.500)

## 2023-02-15 LAB — DIGOXIN LEVEL: Digoxin Level: <0.2 ng/mL — ABNORMAL LOW (ref 0.8–2.0)

## 2023-02-15 MED ORDER — METOPROLOL SUCCINATE ER 50 MG PO TB24: 100.0000 mg | ORAL_TABLET | Freq: Every day | ORAL | 3 refills | Status: DC

## 2023-02-15 MED ORDER — FUROSEMIDE 40 MG PO TABS: ORAL_TABLET | ORAL | 3 refills | Status: DC

## 2023-02-15 NOTE — Progress Notes (Signed)
ReDS Vest / Clip - 02/15/23 0948       ReDS Vest / Clip   Station Marker D    Ruler Value 37    ReDS Value Range Moderate volume overload    ReDS Actual Value 39

## 2023-02-15 NOTE — Progress Notes (Signed)
PCP: Dorcas Carrow, DO  Eugene Henderson is a 72 y.o. male with nonischemic cardiomyopathy based on 2018 cath, HTN, T2DM, HFrEF, long standing persistent atrial fibrillation, hx of tobacco use.  He was admitted on 06/14/22 with complains of chest pain and shortness of breath. On admission, patient noted to be in atrial fibrillation w/ RVR.  He was diuresed and underwent DCCV to NSR.  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  Echo in 1/24 showed EF 20-25% with severe RV dysfunction. Cardiac MRI was done in 2/24, showing LV EF 17%, RV EF 36%, small area of lateral wall subendocardial LGE, mid-wall LGE at the inferoseptal RV insertion site (nonspecific). There was a very small area of possible prior MI, but this study suggests primarily nonischemic cardiomyopathy.    RHC in 4/24 showed normal filling pressures with mildly decreased CI at 2.2.  He failed DCCV on amiodarone in 4/24.  Biotronik ICD was placed in 5/24.   Patient returns for followup of CHF.  He remains in atrial fibrillation chronically.  Weight up 15 lbs.  He reports significant dietary indiscretion. Eats wings, etc, with friends frequently during football season and thinks this is the main problem.  No dyspnea walking slowly on flat ground but still quite limited by bilateral knee OA.  Short of breath with stairs.  No chest pain or lightheadedness.  He also has low back pain.  No orthopnea/PND. Still smokes occasionally while with friends watching football.   ECG (personally reviewed): Atrial fibrillation, LAFB, inferior Qs  REDS clip 39%  Biotronik device interrogation: thoracic impedance trending down  Labs (1/24): K 4.3, creatinine 0.86, LDL 30 Labs (2/24): K 3, creatinine 0.89 Labs (4/24): LFTs normal, TSH normal Labs (5/24): K 4.3, creatinine 1.32 => 0.9, BNP 174 Labs (8/24): LDL 34, LFTs normal, K  4.5, creatinine 0.98, hgb 14.7  PMH: 1. Chronic systolic CHF: Nonischemic cardiomyopathy.  Biotronik ICD.  - Echo (2018): EF 25-30% - RHC/LHC (2018): mean RA 12, PA 37/12, mean PCWP 12, CI 1.77; no significant CAD.  - Echo (1/24): EF 20-25%, severely decreased RV systolic function.  - Cardiac MRI (2/24): LV EF 17%, RV EF 36%, small area of lateral wall subendocardial LGE, mid-wall LGE at the inferoseptal RV insertion site (nonspecific).  - RHC (4/24): mean RA 11, PA 40/16 mean 29, mean PCWP 13, CI 2.22, PAPi 2.2, PVR 3 WU 2. Ascending aortic aneurysm - 4.7 cm ascending aorta on CTA in 1/24.  3. HTN 4. H/o hyperthyroidism 5. Atrial fibrillation: Permanent  - 1/24 DCCV to NSR.  - Failed DCCV 4/24.  6. Type 2 diabetes 7. 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  8. COPD: Quit smoking in 2024.  - PFTs (2/24): Severe obstruction, +restriction.   Social History   Socioeconomic History   Marital status: Single    Spouse name: Not on file   Number of children: Not on file   Years of education: Not on file   Highest education level: Not on file  Occupational History   Occupation: retired  Tobacco Use   Smoking status: Some Days    Current packs/day: 0.00    Types: Pipe, Cigarettes    Last attempt to quit: 12/27/2022    Years since quitting: 0.1   Smokeless  tobacco: Never  Vaping Use   Vaping status: Never Used  Substance and Sexual Activity   Alcohol use: Yes    Alcohol/week: 2.0 standard drinks of alcohol    Types: 2 Cans of beer per week    Comment: 2 40oz beers/week   Drug use: No   Sexual activity: Not Currently  Other Topics Concern   Not on file  Social History Narrative   Not on file   Social Determinants of Health   Financial Resource Strain: Low Risk  (07/27/2022)   Overall Financial Resource Strain (CARDIA)    Difficulty of Paying Living Expenses:  Not very hard  Food Insecurity: No Food Insecurity (07/27/2022)   Hunger Vital Sign    Worried About Running Out of Food in the Last Year: Never true    Ran Out of Food in the Last Year: Never true  Transportation Needs: No Transportation Needs (07/27/2022)   PRAPARE - Administrator, Civil Service (Medical): No    Lack of Transportation (Non-Medical): No  Recent Concern: Transportation Needs - Unmet Transportation Needs (07/12/2022)   PRAPARE - Administrator, Civil Service (Medical): Yes    Lack of Transportation (Non-Medical): No  Physical Activity: Inactive (07/27/2022)   Exercise Vital Sign    Days of Exercise per Week: 0 days    Minutes of Exercise per Session: 0 min  Stress: No Stress Concern Present (07/27/2022)   [employer] of Occupational Health - Occupational Stress Questionnaire    Feeling of Stress : Not at all  Social Connections: Socially Isolated (07/27/2022)   Social Connection and Isolation Panel [NHANES]    Frequency of Communication with Friends and Family: More than three times a week    Frequency of Social Gatherings with Friends and Family: More than three times a week    Attends Religious Services: Never    Database administrator or Organizations: No    Attends Banker Meetings: Never    Marital Status: Divorced  Catering manager Violence: Not At Risk (07/27/2022)   Humiliation, Afraid, Rape, and Kick questionnaire    Fear of Current or Ex-Partner: No    Emotionally Abused: No    Physically Abused: No    Sexually Abused: No   Family History  Problem Relation Age of Onset   Diabetes Mother    Diabetes Father    Heart disease Brother    Heart attack Brother    Diabetes Brother    Kidney failure Brother    Thyroid disease Neg Hx    ROS: All systems reviewed and negative except as per HPI.   Current Outpatient Medications  Medication Sig Dispense Refill   acetaminophen (TYLENOL) 500 MG tablet Take 500-1,000 mg by  mouth every 6 (six) hours as needed for mild pain, fever or headache.      albuterol (VENTOLIN HFA) 108 (90 Base) MCG/ACT inhaler Inhale 1-2 puffs into the lungs every 6 (six) hours as needed for wheezing or shortness of breath. 6.7 each 3   apixaban (ELIQUIS) 5 MG TABS tablet Take 1 tablet (5 mg total) by mouth 2 (two) times daily. 180 tablet 0   atorvastatin (LIPITOR) 40 MG tablet TAKE 1 TABLET BY MOUTH EVERY DAY 90 tablet 1   digoxin (LANOXIN) 0.125 MG tablet Take 0.5 tablet daily. (0.0625 mg) 45 tablet 3   folic acid (FOLVITE) 1 MG tablet TAKE 1 TABLET BY MOUTH EVERY DAY 90 tablet 2   ibuprofen (ADVIL) 200 MG  tablet Take 800 mg by mouth every 6 (six) hours as needed for moderate pain.     JARDIANCE 10 MG TABS tablet TAKE 1 TABLET BY MOUTH EVERY DAY BEFORE BREAKFAST 30 tablet 5   metFORMIN (GLUCOPHAGE) 500 MG tablet TAKE 1 TABLET BY MOUTH 2 TIMES DAILY WITH A MEAL. 180 tablet 1   pantoprazole (PROTONIX) 40 MG tablet Take 1 tablet (40 mg total) by mouth 2 (two) times daily. 180 tablet 0   potassium chloride SA (KLOR-CON M) 20 MEQ tablet Take 20 mEq by mouth 2 (two) times daily.     sacubitril-valsartan (ENTRESTO) 97-103 MG Take 1 tablet by mouth 2 (two) times daily. 60 tablet 6   spironolactone (ALDACTONE) 25 MG tablet TAKE 1 TABLET (25 MG TOTAL) BY MOUTH DAILY. 90 tablet 0   furosemide (LASIX) 40 MG tablet Take 40 mg (1 tablet) in the morning, and 20 mg (0.5 tablet) at night. 135 tablet 3   metoprolol succinate (TOPROL-XL) 50 MG 24 hr tablet Take 2 tablets (100 mg total) by mouth daily. TAKE WITH OR IMMEDIATELY FOLLOWING A MEAL. 180 tablet 3   prednisoLONE acetate (PRED FORTE) 1 % ophthalmic suspension 1 drop 4 (four) times daily. (Patient not taking: Reported on 02/15/2023) 5 mL 0   Tiotropium Bromide-Olodaterol (STIOLTO RESPIMAT) 2.5-2.5 MCG/ACT AERS Inhale 2 puffs into the lungs daily. (Patient not taking: Reported on 02/15/2023) 4 g 11   No current facility-administered medications for this  visit.   BP (!) 130/90 (BP Location: Right Arm, Patient Position: Sitting, Cuff Size: Large)   Pulse 82   Resp 16   Wt 279 lb (126.6 kg)   SpO2 98%   BMI 36.81 kg/m  General: NAD Neck: JVP 8 cm with HJR, no thyromegaly or thyroid nodule.  Lungs: Clear to auscultation bilaterally with normal respiratory effort. CV: Nondisplaced PMI.  Heart irregular S1/S2, no S3/S4, no murmur.  No peripheral edema.  No carotid bruit.  Normal pedal pulses.  Abdomen: Soft, nontender, no hepatosplenomegaly, no distention.  Skin: Intact without lesions or rashes.  Neurologic: Alert and oriented x 3.  Psych: Normal affect. Extremities: No clubbing or cyanosis.  HEENT: Normal.   Assessment/Plan: 1. Chronic systolic CHF: Nonischemic cardiomyopathy, diagnosed 2018.  Biotronik ICD. Cath in 2018 with low CI at 1.77 and no significant CAD.  Most recent echo 1/24 with EF 20-25%, severe RV dysfunction.  Cardiac MRI in 2/24 showed LV EF 17%, RV EF 36%, small area of lateral wall subendocardial LGE, mid-wall LGE at the inferoseptal RV insertion site (nonspecific). There was a very small area of possible prior MI, but this study suggests primarily nonischemic cardiomyopathy.  NYHA class II-III, mild volume overload by exam, REDS clip, and device interrogation.   - Continue Entresto 97/103 bid.   - Continue spironolactone 25 mg daily.  - Continue digoxin 0.0625 daily, check level today.  - Continue Jardiance 10 mg daily.  - Increase Lasix to 40 qam/20 qpm.  BMET/BNP today, BMET in 10 days.   - Increase Toprol XL to 100 mg daily.  2. Atrial fibrillation: Persistent, s/p DCCV to NSR in 1/24.  Suspect AF plays a role in his symptoms but given long-standing cardiomyopathy, suspect his cardiomyopathy is not solely tachycardia-mediated.  Failed DCCV in 4/24 despite amiodarone use.  He is in AF today, rate is controlled.  I do not think we are going to be able to get him into long-term NSR.  - Continue apixaban 5 mg bid.  - At  this  point, with permanent AF, may stop amiodarone.  - Increase Toprol XL as above.  3. COPD: Patient has been a long-standing smoker and has had dyspnea out of proportion to volume overload on exam.  I suspect significant COPD. PFTs in 2/24 showed severe obstruction.   - Needs to quit smoking completely => smoking socially.  4. AAA/TAA: Follows with vascular.   Followup 6 wks.   Marca Ancona 02/15/23

## 2023-02-15 NOTE — Patient Instructions (Signed)
Medication Changes:  STOP Amiodarone  Increase Toprol XL to 100 mg (2 tablets) daily.  Increase Lasix to 40 mg (1 tablet) in the morning, and 20 mg (0.5 tablet) at night.   Lab Work:  Labs done today, your results will be available in MyChart, we will contact you for abnormal readings.  You will have lab work again in 10 days please come to the medical mall entrance to have your lab work completed before 4:30 pm.  Special Instructions // Education:  Do the following things EVERYDAY: Weigh yourself in the morning before breakfast. Write it down and keep it in a log. Take your medicines as prescribed Eat low salt foods--Limit salt (sodium) to 2000 mg per day.  Stay as active as you can everyday Limit all fluids for the day to less than 2 liters   Follow-Up in: please call in October to schedule your November appointment.     If you have any questions or concerns before your next appointment please send [place] a message through [place] or call our office at [phone] Monday-Friday 8 am-5 pm.   If you have an urgent need after hours on the weekend please call your Primary Cardiologist or the [facility] in [place] at [phone].

## 2023-03-07 ENCOUNTER — Encounter: Payer: Self-pay | Admitting: Family Medicine

## 2023-03-08 ENCOUNTER — Encounter: Payer: Self-pay | Admitting: Family Medicine

## 2023-03-09 NOTE — Telephone Encounter (Signed)
Appointment scheduled.

## 2023-03-10 ENCOUNTER — Encounter: Payer: Self-pay | Admitting: Physician Assistant

## 2023-03-10 ENCOUNTER — Ambulatory Visit (INDEPENDENT_AMBULATORY_CARE_PROVIDER_SITE_OTHER): Payer: Medicare HMO | Admitting: Physician Assistant

## 2023-03-10 VITALS — BP 115/71 | HR 76 | Temp 97.9°F

## 2023-03-10 DIAGNOSIS — N390 Urinary tract infection, site not specified: Secondary | ICD-10-CM

## 2023-03-10 DIAGNOSIS — R300 Dysuria: Secondary | ICD-10-CM

## 2023-03-10 LAB — URINALYSIS, ROUTINE W REFLEX MICROSCOPIC
Bilirubin, UA: NEGATIVE
Ketones, UA: NEGATIVE
Nitrite, UA: POSITIVE — AB
Protein,UA: NEGATIVE
RBC, UA: NEGATIVE
Specific Gravity, UA: 1.015 (ref 1.005–1.030)
Urobilinogen, Ur: 0.2 mg/dL (ref 0.2–1.0)
pH, UA: 5.5 (ref 5.0–7.5)

## 2023-03-10 LAB — MICROSCOPIC EXAMINATION: RBC, Urine: NONE SEEN /[HPF] (ref 0–2)

## 2023-03-10 MED ORDER — SULFAMETHOXAZOLE-TRIMETHOPRIM 800-160 MG PO TABS
1.0000 | ORAL_TABLET | Freq: Two times a day (BID) | ORAL | 0 refills | Status: AC
Start: 2023-03-10 — End: 2023-03-17

## 2023-03-10 NOTE — Progress Notes (Signed)
Acute Office Visit   Patient: Eugene Henderson   DOB: 1950/08/10   72 y.o. Male  MRN: [id number] Visit Date: 03/10/2023  Today's healthcare provider: Oswaldo Conroy Terran Hollenkamp, PA-C  Introduced myself to the patient as a Secondary school teacher and provided education on APPs in clinical practice.    Chief Complaint  Patient presents with   Dysuria   Subjective    HPI   He reports concerns for stinging with urination for the past 2 weeks He reports some urinary hesitancy and needing to push or strain but that started several years ago He denies penile drainage or discharge, rash in genital area  He denies fever, chills, flank pain at this time    Medications: Outpatient Medications Prior to Visit  Medication Sig   acetaminophen (TYLENOL) 500 MG tablet Take 500-1,000 mg by mouth every 6 (six) hours as needed for mild pain, fever or headache.    albuterol (VENTOLIN HFA) 108 (90 Base) MCG/ACT inhaler Inhale 1-2 puffs into the lungs every 6 (six) hours as needed for wheezing or shortness of breath.   apixaban (ELIQUIS) 5 MG TABS tablet Take 1 tablet (5 mg total) by mouth 2 (two) times daily.   atorvastatin (LIPITOR) 40 MG tablet TAKE 1 TABLET BY MOUTH EVERY DAY   digoxin (LANOXIN) 0.125 MG tablet Take 0.5 tablet daily. (0.0625 mg)   folic acid (FOLVITE) 1 MG tablet TAKE 1 TABLET BY MOUTH EVERY DAY   furosemide (LASIX) 40 MG tablet Take 40 mg (1 tablet) in the morning, and 20 mg (0.5 tablet) at night.   ibuprofen (ADVIL) 200 MG tablet Take 800 mg by mouth every 6 (six) hours as needed for moderate pain.   JARDIANCE 10 MG TABS tablet TAKE 1 TABLET BY MOUTH EVERY DAY BEFORE BREAKFAST   metFORMIN (GLUCOPHAGE) 500 MG tablet TAKE 1 TABLET BY MOUTH 2 TIMES DAILY WITH A MEAL.   metoprolol succinate (TOPROL-XL) 50 MG 24 hr tablet Take 2 tablets (100 mg total) by mouth daily. TAKE WITH OR IMMEDIATELY FOLLOWING A MEAL.   pantoprazole (PROTONIX) 40 MG tablet Take 1 tablet (40 mg total) by mouth 2 (two) times daily.    potassium chloride SA (KLOR-CON M) 20 MEQ tablet Take 20 mEq by mouth 2 (two) times daily.   sacubitril-valsartan (ENTRESTO) 97-103 MG Take 1 tablet by mouth 2 (two) times daily.   spironolactone (ALDACTONE) 25 MG tablet TAKE 1 TABLET (25 MG TOTAL) BY MOUTH DAILY.   prednisoLONE acetate (PRED FORTE) 1 % ophthalmic suspension 1 drop 4 (four) times daily. (Patient not taking: Reported on 02/15/2023)   Tiotropium Bromide-Olodaterol (STIOLTO RESPIMAT) 2.5-2.5 MCG/ACT AERS Inhale 2 puffs into the lungs daily. (Patient not taking: Reported on 02/15/2023)   No facility-administered medications prior to visit.    Review of Systems  Constitutional:  Positive for chills (states he has this normally at night). Negative for fever.  Gastrointestinal:  Negative for abdominal pain.  Genitourinary:  Positive for dysuria. Negative for decreased urine volume, difficulty urinating, flank pain, penile pain, penile swelling and urgency.        Objective    BP 115/71   Pulse 76   Temp 97.9 F (36.6 C) (Oral)   SpO2 97%     Physical Exam Vitals reviewed.  Constitutional:      General: He is awake.     Appearance: Normal appearance. He is well-developed and well-groomed.  HENT:     Head: Normocephalic and atraumatic.  Pulmonary:  Effort: Pulmonary effort is normal.  Musculoskeletal:     Cervical back: Normal range of motion.  Neurological:     Mental Status: He is alert.  Psychiatric:        Attention and Perception: Attention and perception normal.        Mood and Affect: Mood and affect normal.        Speech: Speech normal.        Behavior: Behavior normal. Behavior is cooperative.       No results found for any visits on 03/10/23.  Assessment & Plan      No follow-ups on file.     Problem List Items Addressed This Visit   None Visit Diagnoses     Urinary tract infection without hematuria, site unspecified    -  Primary Acute, new problem Patient reports symptoms comprised of  the following: dysuria, pressure, incomplete voiding for about 2 weeks  Results of UA are consistent with UTI - urine sample sent for culture to determine causative organism and susceptibility- results to dictate further management  UA results reviewed with patient during apt  Will start bactrim 800-160 mg PO BID x 7 days for now pending urine culture results  discussed importance of finishing entire course of abx and staying well hydrated while recovering from UTI Reviewed ED precautions with patient Follow up as needed for persistent or worsening symptoms    Relevant Medications   sulfamethoxazole-trimethoprim (BACTRIM DS) 800-160 MG tablet   Other Relevant Orders   Urine Culture   Dysuria       Relevant Orders   Urinalysis, Routine w reflex microscopic        No follow-ups on file.   I, Jahziel Sinn E Armina Galloway, PA-C, have reviewed all documentation for this visit. The documentation on 03/10/23 for the exam, diagnosis, procedures, and orders are all accurate and complete.   Jacquelin Hawking, MHS, PA-C [facility]

## 2023-03-10 NOTE — Patient Instructions (Signed)
Based on your symptoms and results of the urinalysis I believe you have a UTI I recommend the following:   I have sent in a script for Bactrim 800-160 mg to be taken by mouth twice per day for 7 days    Please finish the entire course of the antibiotic even if you are feeling better before it is completed.  Stay well hydrated (at least 75 oz of water per day) and avoid holding your urine  If you have any of the following please let us know: persistent symptoms, fever, trouble urinating or inability to urinate, confusion, flank pain.

## 2023-03-14 LAB — URINE CULTURE

## 2023-03-15 NOTE — Progress Notes (Signed)
Your urine culture results are back.  You are found to have a UTI caused by a bacteria called Klebsiella aerogenes.  The antibiotic, Bactrim, that you were started on was found to be sufficient to manage this infection.  Please make sure that you have been taking the medication as directed and finish the entire course even if you are feeling better.  Please make sure that you are staying well-hydrated and avoid holding her urine for prolonged periods of time.  If you are still having symptoms or concerns please give [place] a call

## 2023-03-16 ENCOUNTER — Telehealth: Payer: Self-pay | Admitting: Family Medicine

## 2023-03-16 NOTE — Telephone Encounter (Signed)
Mr. Gennie Oo's daughter, Ulyses Jarred brought by a Disability Parking Placard Form to be completed by Dr.Porcelli. I informed daughter to allow the provider 48-72 hours to review/complete form. I am placing form in providers folder. Ursula Alert Ladona Ridgel would like a phone call at [phone] when complete.

## 2023-04-12 ENCOUNTER — Encounter: Payer: Self-pay | Admitting: Family Medicine

## 2023-04-12 ENCOUNTER — Ambulatory Visit (INDEPENDENT_AMBULATORY_CARE_PROVIDER_SITE_OTHER): Payer: Medicare HMO | Admitting: Family Medicine

## 2023-04-12 VITALS — BP 132/64 | HR 86 | Ht 73.0 in | Wt 271.0 lb

## 2023-04-12 DIAGNOSIS — E1159 Type 2 diabetes mellitus with other circulatory complications: Secondary | ICD-10-CM

## 2023-04-12 DIAGNOSIS — Z23 Encounter for immunization: Secondary | ICD-10-CM

## 2023-04-12 DIAGNOSIS — M545 Low back pain, unspecified: Secondary | ICD-10-CM | POA: Diagnosis not present

## 2023-04-12 DIAGNOSIS — Z7984 Long term (current) use of oral hypoglycemic drugs: Secondary | ICD-10-CM

## 2023-04-12 DIAGNOSIS — J432 Centrilobular emphysema: Secondary | ICD-10-CM

## 2023-04-12 DIAGNOSIS — G8929 Other chronic pain: Secondary | ICD-10-CM

## 2023-04-12 LAB — BAYER DCA HB A1C WAIVED: HB A1C (BAYER DCA - WAIVED): 6.2 % — ABNORMAL HIGH (ref 4.8–5.6)

## 2023-04-12 MED ORDER — ATORVASTATIN CALCIUM 40 MG PO TABS: 40.0000 mg | ORAL_TABLET | Freq: Every day | ORAL | 1 refills | Status: DC

## 2023-04-12 MED ORDER — IBUPROFEN 200 MG PO TABS: 800.0000 mg | ORAL_TABLET | Freq: Three times a day (TID) | ORAL | 4 refills | Status: DC | PRN

## 2023-04-12 MED ORDER — PANTOPRAZOLE SODIUM 40 MG PO TBEC: 40.0000 mg | DELAYED_RELEASE_TABLET | Freq: Two times a day (BID) | ORAL | 1 refills | Status: DC

## 2023-04-12 MED ORDER — EMPAGLIFLOZIN 10 MG PO TABS: 10.0000 mg | ORAL_TABLET | Freq: Every day | ORAL | 1 refills | Status: DC

## 2023-04-12 MED ORDER — SPIRONOLACTONE 25 MG PO TABS: 25.0000 mg | ORAL_TABLET | Freq: Every day | ORAL | 1 refills | Status: DC

## 2023-04-12 MED ORDER — STIOLTO RESPIMAT 2.5-2.5 MCG/ACT IN AERS: 2.0000 | INHALATION_SPRAY | Freq: Every day | RESPIRATORY_TRACT | 11 refills | Status: DC

## 2023-04-12 MED ORDER — ACETAMINOPHEN 500 MG PO TABS: 500.0000 mg | ORAL_TABLET | Freq: Four times a day (QID) | ORAL | 4 refills | Status: DC | PRN

## 2023-04-12 NOTE — Assessment & Plan Note (Signed)
Would like to see pain management- referral generated. Worse since he has not been getting his tylenol and ibuprofen as prescriptions because he cannot afford them and has not been taking them- will send in as Rx. Call with any concerns. Continue to monitor.

## 2023-04-12 NOTE — Assessment & Plan Note (Signed)
Doing well with A1c of 6.2. Continue current regimen. Continue to monitor. Call with any concerns.  ?

## 2023-04-12 NOTE — Progress Notes (Signed)
BP 132/64   Pulse 86   Ht 6\' 1"  (1.854 m)   Wt 271 lb (122.9 kg)   SpO2 98%   BMI 35.75 kg/m    Subjective:    Patient ID: Eugene Henderson, male    DOB: August 24, 1950, 72 y.o.   MRN: [id number]  HPI: Eugene Henderson is a 72 y.o. male  Chief Complaint  Patient presents with   Diabetes   Back Pain    Patient says he has Chronic Back Pain and says if he has the infusion, that he would be paralyzed. Patient wants to discuss with Dr Laural Benes at today's visit.    DIABETES Hypoglycemic episodes:no Polydipsia/polyuria: no Visual disturbance: no Chest pain: no Paresthesias: no Glucose Monitoring: no  Accucheck frequency: Not Checking Taking Insulin?: no Blood Pressure Monitoring: not checking Retinal Examination: Up to Date Foot Exam: Up to Date Diabetic Education: Completed Pneumovax: Up to Date Influenza: Up to Date Aspirin: no  BACK PAIN Duration: chronic Mechanism of injury: unknown Location: bilateral and low back Onset: gradual Severity: moderate Quality: throbbing Frequency: constant Radiation: down his legs Aggravating factors: movement Alleviating factors: tylenol and ibuprofen Status: worse Treatments attempted: rest, ice, heat, APAP, ibuprofen, and aleve  Relief with NSAIDs?: significant Nighttime pain:  no Paresthesias / decreased sensation:  no Bowel / bladder incontinence:  no Fevers:  no Dysuria / urinary frequency:  no  Relevant past medical, surgical, family and social history reviewed and updated as indicated. Interim medical history since our last visit reviewed. Allergies and medications reviewed and updated.  Review of Systems  Constitutional: Negative.   Respiratory: Negative.    Cardiovascular: Negative.   Musculoskeletal:  Positive for back pain and myalgias. Negative for arthralgias, gait problem, joint swelling, neck pain and neck stiffness.  Skin: Negative.   Psychiatric/Behavioral: Negative.      Per HPI unless specifically  indicated above     Objective:    BP 132/64   Pulse 86   Ht 6\' 1"  (1.854 m)   Wt 271 lb (122.9 kg)   SpO2 98%   BMI 35.75 kg/m   Wt Readings from Last 3 Encounters:  04/12/23 271 lb (122.9 kg)  02/15/23 279 lb (126.6 kg)  02/09/23 269 lb 4 oz (122.1 kg)    Physical Exam Vitals and nursing note reviewed.  Constitutional:      General: He is not in acute distress.    Appearance: Normal appearance. He is obese. He is not ill-appearing, toxic-appearing or diaphoretic.  HENT:     Head: Normocephalic and atraumatic.     Right Ear: External ear normal.     Left Ear: External ear normal.     Nose: Nose normal.     Mouth/Throat:     Mouth: Mucous membranes are moist.     Pharynx: Oropharynx is clear.  Eyes:     General: No scleral icterus.       Right eye: No discharge.        Left eye: No discharge.     Extraocular Movements: Extraocular movements intact.     Conjunctiva/sclera: Conjunctivae normal.     Pupils: Pupils are equal, round, and reactive to light.  Cardiovascular:     Rate and Rhythm: Normal rate and regular rhythm.     Pulses: Normal pulses.     Heart sounds: Normal heart sounds. No murmur heard.    No friction rub. No gallop.  Pulmonary:     Effort: Pulmonary effort is normal. No respiratory distress.  Breath sounds: Normal breath sounds. No stridor. No wheezing, rhonchi or rales.  Chest:     Chest wall: No tenderness.  Musculoskeletal:        General: Normal range of motion.     Cervical back: Normal range of motion and neck supple.  Skin:    General: Skin is warm and dry.     Capillary Refill: Capillary refill takes less than 2 seconds.     Coloration: Skin is not jaundiced or pale.     Findings: No bruising, erythema, lesion or rash.  Neurological:     General: No focal deficit present.     Mental Status: He is alert and oriented to person, place, and time. Mental status is at baseline.  Psychiatric:        Mood and Affect: Mood normal.         Behavior: Behavior normal.        Thought Content: Thought content normal.        Judgment: Judgment normal.     Results for orders placed or performed in visit on 03/10/23  Urine Culture   Specimen: Urine   UR  Result Value Ref Range   Urine Culture, Routine Final report (A)    Organism ID, Bacteria Klebsiella aerogenes (A)    Antimicrobial Susceptibility Comment   Microscopic Examination   Urine  Result Value Ref Range   WBC, UA 6-10 (A) 0 - 5 /hpf   RBC, Urine None seen 0 - 2 /hpf   Epithelial Cells (non renal) 0-10 0 - 10 /hpf   Mucus, UA Present (A) Not Estab.   Bacteria, UA Many (A) None seen/Few  Urinalysis, Routine w reflex microscopic  Result Value Ref Range   Specific Gravity, UA 1.015 1.005 - 1.030   pH, UA 5.5 5.0 - 7.5   Color, UA Yellow Yellow   Appearance Ur Cloudy (A) Clear   Leukocytes,UA 1+ (A) Negative   Protein,UA Negative Negative/Trace   Glucose, UA 1+ (A) Negative   Ketones, UA Negative Negative   RBC, UA Negative Negative   Bilirubin, UA Negative Negative   Urobilinogen, Ur 0.2 0.2 - 1.0 mg/dL   Nitrite, UA Positive (A) Negative   Microscopic Examination See below:       Assessment & Plan:   Problem List Items Addressed This Visit       Cardiovascular and Mediastinum   Diabetes mellitus with cardiac complication (HCC) - Primary    Doing well with A1c of 6.2. Continue current regimen. Continue to monitor. Call with any concerns.       Relevant Medications   amiodarone (PACERONE) 200 MG tablet   atorvastatin (LIPITOR) 40 MG tablet   empagliflozin (JARDIANCE) 10 MG TABS tablet   spironolactone (ALDACTONE) 25 MG tablet   Other Relevant Orders   Bayer DCA Hb A1c Waived     Respiratory   COPD (chronic obstructive pulmonary disease) (HCC)    Under good control on current regimen. Continue current regimen. Continue to monitor. Call with any concerns. Refills given.        Relevant Medications   Tiotropium Bromide-Olodaterol (STIOLTO  RESPIMAT) 2.5-2.5 MCG/ACT AERS     Other   Chronic low back pain    Would like to see pain management- referral generated. Worse since he has not been getting his tylenol and ibuprofen as prescriptions because he cannot afford them and has not been taking them- will send in as Rx. Call with any concerns. Continue to monitor.  Relevant Medications   acetaminophen (TYLENOL) 500 MG tablet   ibuprofen (ADVIL) 200 MG tablet   Other Relevant Orders   Ambulatory referral to Pain Clinic   Other Visit Diagnoses     Need for COVID-19 vaccine       Covid shot given today.   Relevant Orders   Pfizer Comirnaty Covid -19 Vaccine 33yrs and older (Completed)   Needs flu shot       Flu shot given today.   Relevant Orders   Flu Vaccine Trivalent High Dose (Fluad) (Completed)        Follow up plan: Return in about 3 months (around 07/13/2023).

## 2023-04-12 NOTE — Assessment & Plan Note (Signed)
Under good control on current regimen. Continue current regimen. Continue to monitor. Call with any concerns. Refills given.   

## 2023-04-14 ENCOUNTER — Other Ambulatory Visit: Payer: Self-pay | Admitting: Family Medicine

## 2023-04-14 NOTE — Telephone Encounter (Signed)
Requested Prescriptions  Pending Prescriptions Disp Refills   spironolactone (ALDACTONE) 25 MG tablet [Pharmacy Med Name: SPIRONOLACTONE 25 MG TABLET] 90 tablet 1    Sig: TAKE 1 TABLET (25 MG TOTAL) BY MOUTH DAILY.     Cardiovascular: Diuretics - Aldosterone Antagonist Passed - 04/14/2023  2:43 AM      Passed - Cr in normal range and within 180 days    Creatinine, Ser  Date Value Ref Range Status  02/15/2023 0.80 0.61 - 1.24 mg/dL Final         Passed - K in normal range and within 180 days    Potassium  Date Value Ref Range Status  02/15/2023 4.1 3.5 - 5.1 mmol/L Final         Passed - Na in normal range and within 180 days    Sodium  Date Value Ref Range Status  02/15/2023 136 135 - 145 mmol/L Final  01/10/2023 138 134 - 144 mmol/L Final         Passed - eGFR is 30 or above and within 180 days    GFR calc Af Amer  Date Value Ref Range Status  03/28/2020 96 >59 mL/min/1.73 Final    Comment:    **In accordance with recommendations from the NKF-ASN Task force,**   [facility] is in the process of updating its eGFR calculation to the   2021 CKD-EPI creatinine equation that estimates kidney function   without a race variable.    GFR, Estimated  Date Value Ref Range Status  02/15/2023 >60 >60 mL/min Final    Comment:    (NOTE) Calculated using the CKD-EPI Creatinine Equation (2021)    eGFR  Date Value Ref Range Status  01/10/2023 82 >59 mL/min/1.73 Final         Passed - Last BP in normal range    BP Readings from Last 1 Encounters:  04/12/23 132/64         Passed - Valid encounter within last 6 months    Recent Outpatient Visits           2 days ago Diabetes mellitus with cardiac complication [facility])   [place] [facility] [place], Megan P, DO   1 month ago Urinary tract infection without hematuria, site unspecified   [place] [facility] [place], Erin E, PA-C   3 months ago Diabetes mellitus with cardiac complication [facility])   [facility] [place], Megan P, DO   6 months ago Diabetes mellitus with cardiac complication [facility])   [place] [facility] [place], Megan P, DO   7 months ago Post-traumatic osteoarthritis of right knee   [place] [facility] [place], Oralia Rud, DO       Future Appointments             In 3 months Laural Benes, Oralia Rud, DO [place] [facility]

## 2023-04-22 ENCOUNTER — Ambulatory Visit (HOSPITAL_BASED_OUTPATIENT_CLINIC_OR_DEPARTMENT_OTHER): Payer: Medicare HMO | Admitting: Cardiology

## 2023-04-22 ENCOUNTER — Other Ambulatory Visit
Admission: RE | Admit: 2023-04-22 | Discharge: 2023-04-22 | Disposition: A | Discharge status: Home / Self Care | Payer: Medicare HMO | Source: Ambulatory Visit | Attending: Cardiology | Admitting: Cardiology

## 2023-04-22 ENCOUNTER — Other Ambulatory Visit (INDEPENDENT_AMBULATORY_CARE_PROVIDER_SITE_OTHER): Payer: Self-pay | Admitting: Vascular Surgery

## 2023-04-22 VITALS — BP 133/85 | HR 87 | Wt 277.2 lb

## 2023-04-22 DIAGNOSIS — Z7901 Long term (current) use of anticoagulants: Secondary | ICD-10-CM | POA: Insufficient documentation

## 2023-04-22 DIAGNOSIS — I4821 Permanent atrial fibrillation: Secondary | ICD-10-CM | POA: Insufficient documentation

## 2023-04-22 DIAGNOSIS — F17290 Nicotine dependence, other tobacco product, uncomplicated: Secondary | ICD-10-CM | POA: Diagnosis not present

## 2023-04-22 DIAGNOSIS — Z79899 Other long term (current) drug therapy: Secondary | ICD-10-CM | POA: Insufficient documentation

## 2023-04-22 DIAGNOSIS — I5022 Chronic systolic (congestive) heart failure: Secondary | ICD-10-CM | POA: Diagnosis not present

## 2023-04-22 DIAGNOSIS — Z7984 Long term (current) use of oral hypoglycemic drugs: Secondary | ICD-10-CM | POA: Insufficient documentation

## 2023-04-22 DIAGNOSIS — E119 Type 2 diabetes mellitus without complications: Secondary | ICD-10-CM | POA: Insufficient documentation

## 2023-04-22 DIAGNOSIS — I428 Other cardiomyopathies: Secondary | ICD-10-CM | POA: Insufficient documentation

## 2023-04-22 DIAGNOSIS — I110 Hypertensive heart disease with heart failure: Secondary | ICD-10-CM | POA: Insufficient documentation

## 2023-04-22 DIAGNOSIS — I712 Thoracic aortic aneurysm, without rupture, unspecified: Secondary | ICD-10-CM | POA: Diagnosis not present

## 2023-04-22 DIAGNOSIS — J449 Chronic obstructive pulmonary disease, unspecified: Secondary | ICD-10-CM | POA: Diagnosis not present

## 2023-04-22 DIAGNOSIS — I714 Abdominal aortic aneurysm, without rupture, unspecified: Secondary | ICD-10-CM

## 2023-04-22 DIAGNOSIS — F17210 Nicotine dependence, cigarettes, uncomplicated: Secondary | ICD-10-CM | POA: Diagnosis not present

## 2023-04-22 DIAGNOSIS — Z5982 Transportation insecurity: Secondary | ICD-10-CM | POA: Diagnosis not present

## 2023-04-22 LAB — COMPREHENSIVE METABOLIC PANEL
ALT: 13 U/L (ref 0–44)
AST: 16 U/L (ref 15–41)
Albumin: 4.1 g/dL (ref 3.5–5.0)
Alkaline Phosphatase: 111 U/L (ref 38–126)
Anion gap: 9 (ref 5–15)
BUN: 15 mg/dL (ref 8–23)
CO2: 23 mmol/L (ref 22–32)
Calcium: 8.8 mg/dL — ABNORMAL LOW (ref 8.9–10.3)
Chloride: 102 mmol/L (ref 98–111)
Creatinine, Ser: 0.87 mg/dL (ref 0.61–1.24)
GFR, Estimated: >60 mL/min (ref >60)
Glucose, Bld: 98 mg/dL (ref 70–99)
Potassium: 4.6 mmol/L (ref 3.5–5.1)
Sodium: 134 mmol/L — ABNORMAL LOW (ref 135–145)
Total Bilirubin: 0.9 mg/dL (ref <1.2)
Total Protein: 7.9 g/dL (ref 6.5–8.1)

## 2023-04-22 LAB — TSH: TSH: 3.598 u[IU]/mL (ref 0.350–4.500)

## 2023-04-22 LAB — DIGOXIN LEVEL: Digoxin Level: <0.2 ng/mL — ABNORMAL LOW (ref 0.8–2.0)

## 2023-04-22 LAB — BRAIN NATRIURETIC PEPTIDE: B Natriuretic Peptide: 228.5 pg/mL — ABNORMAL HIGH (ref 0.0–100.0)

## 2023-04-22 MED ORDER — METOPROLOL SUCCINATE ER 50 MG PO TB24: 150.0000 mg | ORAL_TABLET | Freq: Every day | ORAL | Status: DC

## 2023-04-22 NOTE — Progress Notes (Signed)
PCP: Dorcas Carrow, DO  Eugene Henderson is a 72 y.o. male with nonischemic cardiomyopathy based on 2018 cath, HTN, T2DM, HFrEF, long standing persistent atrial fibrillation, hx of tobacco use.  He was admitted on 06/14/22 with complains of chest pain and shortness of breath. On admission, patient noted to be in atrial fibrillation w/ RVR.  He was diuresed and underwent DCCV to NSR.  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  Echo in 1/24 showed EF 20-25% with severe RV dysfunction. Cardiac MRI was done in 2/24, showing LV EF 17%, RV EF 36%, small area of lateral wall subendocardial LGE, mid-wall LGE at the inferoseptal RV insertion site (nonspecific). There was a very small area of possible prior MI, but this study suggests primarily nonischemic cardiomyopathy.    RHC in 4/24 showed normal filling pressures with mildly decreased CI at 2.2.  He failed DCCV on amiodarone in 4/24.  Biotronik ICD was placed in 5/24.   Patient returns for followup of CHF.  He remains in atrial fibrillation chronically.  Weight down 2 lbs. No dyspnea walking slowly on flat ground but still quite limited by bilateral knee OA.  Short of breath with stairs/inclines.  Smokes rarely, usually when someone gives him a cigarette.  No chest pain or lightheadedness. He does report orthopnea.   REDS clip 32%  Labs (1/24): K 4.3, creatinine 0.86, LDL 30 Labs (2/24): K 3, creatinine 0.89 Labs (4/24): LFTs normal, TSH normal Labs (5/24): K 4.3, creatinine 1.32 => 0.9, BNP 174 Labs (8/24): LDL 34, LFTs normal, K 4.5, creatinine 0.98, hgb 14.7 Labs (9/24): K 4.1, creatinine 0.8, LFTs normal, TSH normal  PMH: 1. Chronic systolic CHF: Nonischemic cardiomyopathy.  Biotronik ICD.  - Echo (2018): EF 25-30% - RHC/LHC (2018): mean RA 12, PA 37/12, mean PCWP 12, CI 1.77; no significant CAD.  - Echo (1/24): EF  20-25%, severely decreased RV systolic function.  - Cardiac MRI (2/24): LV EF 17%, RV EF 36%, small area of lateral wall subendocardial LGE, mid-wall LGE at the inferoseptal RV insertion site (nonspecific).  - RHC (4/24): mean RA 11, PA 40/16 mean 29, mean PCWP 13, CI 2.22, PAPi 2.2, PVR 3 WU 2. Ascending aortic aneurysm - 4.7 cm ascending aorta on CTA in 1/24.  3. HTN 4. H/o hyperthyroidism 5. Atrial fibrillation: Permanent  - 1/24 DCCV to NSR.  - Failed DCCV 4/24.  6. Type 2 diabetes 7. 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  8. COPD: Quit smoking in 2024.  - PFTs (2/24): Severe obstruction, +restriction.   Social History   Socioeconomic History   Marital status: Single    Spouse name: Not on file   Number of children: Not on file   Years of education: Not on file   Highest education level: Not on file  Occupational History   Occupation: retired  Tobacco Use   Smoking status: Some Days    Current packs/day: 0.00    Types: Pipe, Cigarettes    Last attempt to quit: 12/27/2022    Years since quitting: 0.3   Smokeless tobacco: Never  Vaping Use   Vaping status: Never Used  Substance and Sexual Activity   Alcohol use: Yes    Alcohol/week: 2.0 standard drinks of alcohol    Types:  2 Cans of beer per week    Comment: 2 40oz beers/week   Drug use: No   Sexual activity: Not Currently  Other Topics Concern   Not on file  Social History Narrative   Not on file   Social Determinants of Health   Financial Resource Strain: Low Risk  (07/27/2022)   Overall Financial Resource Strain (CARDIA)    Difficulty of Paying Living Expenses: Not very hard  Food Insecurity: No Food Insecurity (07/27/2022)   Hunger Vital Sign    Worried About Running Out of Food in the Last Year: Never true    Ran Out of Food in the Last Year: Never true  Transportation Needs: No  Transportation Needs (07/27/2022)   PRAPARE - Administrator, Civil Service (Medical): No    Lack of Transportation (Non-Medical): No  Recent Concern: Transportation Needs - Unmet Transportation Needs (07/12/2022)   PRAPARE - Administrator, Civil Service (Medical): Yes    Lack of Transportation (Non-Medical): No  Physical Activity: Inactive (07/27/2022)   Exercise Vital Sign    Days of Exercise per Week: 0 days    Minutes of Exercise per Session: 0 min  Stress: No Stress Concern Present (07/27/2022)   [employer] of Occupational Health - Occupational Stress Questionnaire    Feeling of Stress : Not at all  Social Connections: Socially Isolated (07/27/2022)   Social Connection and Isolation Panel [NHANES]    Frequency of Communication with Friends and Family: More than three times a week    Frequency of Social Gatherings with Friends and Family: More than three times a week    Attends Religious Services: Never    Database administrator or Organizations: No    Attends Banker Meetings: Never    Marital Status: Divorced  Catering manager Violence: Not At Risk (07/27/2022)   Humiliation, Afraid, Rape, and Kick questionnaire    Fear of Current or Ex-Partner: No    Emotionally Abused: No    Physically Abused: No    Sexually Abused: No   Family History  Problem Relation Age of Onset   Diabetes Mother    Diabetes Father    Heart disease Brother    Heart attack Brother    Diabetes Brother    Kidney failure Brother    Thyroid disease Neg Hx    ROS: All systems reviewed and negative except as per HPI.   Current Outpatient Medications  Medication Sig Dispense Refill   acetaminophen (TYLENOL) 500 MG tablet Take 1-2 tablets (500-1,000 mg total) by mouth every 6 (six) hours as needed for mild pain (pain score 1-3), fever or headache. 800 tablet 4   albuterol (VENTOLIN HFA) 108 (90 Base) MCG/ACT inhaler Inhale 1-2 puffs into the lungs every 6 (six) hours  as needed for wheezing or shortness of breath. 6.7 each 3   apixaban (ELIQUIS) 5 MG TABS tablet Take 1 tablet (5 mg total) by mouth 2 (two) times daily. 180 tablet 0   atorvastatin (LIPITOR) 40 MG tablet Take 1 tablet (40 mg total) by mouth daily. 90 tablet 1   digoxin (LANOXIN) 0.125 MG tablet Take 0.5 tablet daily. (0.0625 mg) 45 tablet 3   empagliflozin (JARDIANCE) 10 MG TABS tablet Take 1 tablet (10 mg total) by mouth daily. 90 tablet 1   folic acid (FOLVITE) 1 MG tablet TAKE 1 TABLET BY MOUTH EVERY DAY 90 tablet 2   furosemide (LASIX) 40 MG tablet Take 40 mg (1 tablet)  in the morning, and 20 mg (0.5 tablet) at night. 135 tablet 3   ibuprofen (ADVIL) 200 MG tablet Take 4 tablets (800 mg total) by mouth every 8 (eight) hours as needed for moderate pain (pain score 4-6). 1200 tablet 4   metFORMIN (GLUCOPHAGE) 500 MG tablet TAKE 1 TABLET BY MOUTH 2 TIMES DAILY WITH A MEAL. 180 tablet 1   pantoprazole (PROTONIX) 40 MG tablet Take 1 tablet (40 mg total) by mouth 2 (two) times daily. 180 tablet 1   potassium chloride SA (KLOR-CON M) 20 MEQ tablet Take 20 mEq by mouth 2 (two) times daily.     prednisoLONE acetate (PRED FORTE) 1 % ophthalmic suspension 1 drop 4 (four) times daily. 5 mL 0   sacubitril-valsartan (ENTRESTO) 97-103 MG Take 1 tablet by mouth 2 (two) times daily. 60 tablet 6   spironolactone (ALDACTONE) 25 MG tablet Take 1 tablet (25 mg total) by mouth daily. 90 tablet 1   Tiotropium Bromide-Olodaterol (STIOLTO RESPIMAT) 2.5-2.5 MCG/ACT AERS Inhale 2 puffs into the lungs daily. 4 g 11   metoprolol succinate (TOPROL-XL) 50 MG 24 hr tablet Take 3 tablets (150 mg total) by mouth daily. TAKE WITH OR IMMEDIATELY FOLLOWING A MEAL.     No current facility-administered medications for this visit.   BP 133/85   Pulse 87   Wt 277 lb 4 oz (125.8 kg)   SpO2 100%   BMI 36.58 kg/m  General: NAD Neck: JVP 8 cm, no thyromegaly or thyroid nodule.  Lungs: Clear to auscultation bilaterally with normal  respiratory effort. CV: Nondisplaced PMI.  Heart regular S1/S2, no S3/S4, 1/6 SEM RUSB.  No peripheral edema.  No carotid bruit.  Normal pedal pulses.  Abdomen: Soft, nontender, no hepatosplenomegaly, no distention.  Skin: Intact without lesions or rashes.  Neurologic: Alert and oriented x 3.  Psych: Normal affect. Extremities: No clubbing or cyanosis.  HEENT: Normal.   Assessment/Plan: 1. Chronic systolic CHF: Nonischemic cardiomyopathy, diagnosed 2018.  Biotronik ICD. Cath in 2018 with low CI at 1.77 and no significant CAD.  Most recent echo 1/24 with EF 20-25%, severe RV dysfunction.  Cardiac MRI in 2/24 showed LV EF 17%, RV EF 36%, small area of lateral wall subendocardial LGE, mid-wall LGE at the inferoseptal RV insertion site (nonspecific). There was a very small area of possible prior MI, but this study suggests primarily nonischemic cardiomyopathy.  NYHA class II-III, not significantly volume overloaded by exam or REDS clip.    - Continue Entresto 97/103 bid.   - Continue spironolactone 25 mg daily.  - Continue digoxin 0.0625 daily, check level today.  - Continue Jardiance 10 mg daily.  - Continue Lasix 40 qam/20 qpm.  BMET/BNP today.   - Increase Toprol XL to 150 mg daily.  2. Atrial fibrillation: s/p DCCV to NSR in 1/24.  Suspect AF plays a role in his symptoms but given long-standing cardiomyopathy, suspect his cardiomyopathy is not solely tachycardia-mediated.  Failed DCCV in 4/24 despite amiodarone use.  He is in AF today, rate is controlled.  I think that AF is now permanent.  - Continue apixaban 5 mg bid.  - He is supposed to be off amiodarone with permanent AF but he is not sure if he stopped it or not.  He will check when he gets home.  - Increase Toprol XL as above.  3. COPD: Patient has been a long-standing smoker and has had dyspnea out of proportion to volume overload on exam.  I suspect significant COPD. PFTs  in 2/24 showed severe obstruction.   - Needs to quit smoking  completely => smoking socially.  4. AAA/TAA: Follows with vascular.   Followup 2 months with me.   Marca Ancona 04/22/23

## 2023-04-22 NOTE — Patient Instructions (Signed)
Discontinue Amiodarone  INCREASE Toprol XL to 150mg  daily  Routine lab work today. Will notify you of abnormal results  Follow up in 2 months  Do the following things EVERYDAY: Weigh yourself in the morning before breakfast. Write it down and keep it in a log. Take your medicines as prescribed Eat low salt foods--Limit salt (sodium) to 2000 mg per day.  Stay as active as you can everyday Limit all fluids for the day to less than 2 liters

## 2023-04-28 ENCOUNTER — Telehealth: Payer: Self-pay

## 2023-04-28 NOTE — Telephone Encounter (Signed)
Patient is having frequent device flagged slow NSVT events. (This is not new, ongoing for months but with recent uptick).   EGMs show what appear to be fast AF w/RVR's (has a dx lead). Most recent event was ongoing at time of transmission with Vrates:  105 - 182bpm.  Amiodarone was decreased at 02/2023 OV with Dr. Lalla Brothers.  LM with patient to call us back to assess symptoms. Also, to have him send [place] a quick check to ensure improve

## 2023-04-29 NOTE — Telephone Encounter (Signed)
LM

## 2023-05-02 ENCOUNTER — Ambulatory Visit (INDEPENDENT_AMBULATORY_CARE_PROVIDER_SITE_OTHER): Payer: Medicare HMO

## 2023-05-02 ENCOUNTER — Encounter (INDEPENDENT_AMBULATORY_CARE_PROVIDER_SITE_OTHER): Payer: Self-pay | Admitting: Vascular Surgery

## 2023-05-02 ENCOUNTER — Ambulatory Visit (INDEPENDENT_AMBULATORY_CARE_PROVIDER_SITE_OTHER): Payer: Medicare HMO | Admitting: Vascular Surgery

## 2023-05-02 VITALS — BP 109/76 | HR 79 | Resp 16

## 2023-05-02 DIAGNOSIS — I10 Essential (primary) hypertension: Secondary | ICD-10-CM | POA: Diagnosis not present

## 2023-05-02 DIAGNOSIS — I4819 Other persistent atrial fibrillation: Secondary | ICD-10-CM

## 2023-05-02 DIAGNOSIS — I714 Abdominal aortic aneurysm, without rupture, unspecified: Secondary | ICD-10-CM | POA: Diagnosis not present

## 2023-05-02 DIAGNOSIS — E1159 Type 2 diabetes mellitus with other circulatory complications: Secondary | ICD-10-CM

## 2023-05-02 DIAGNOSIS — I7161 Supraceliac aneurysm of the thoracoabdominal aorta, without rupture: Secondary | ICD-10-CM

## 2023-05-04 ENCOUNTER — Encounter (INDEPENDENT_AMBULATORY_CARE_PROVIDER_SITE_OTHER): Payer: Self-pay | Admitting: Vascular Surgery

## 2023-05-04 NOTE — Progress Notes (Signed)
MRN : [id number]  Eugene Henderson is a 72 y.o. (June 10, 1950) male who presents with chief complaint of check circulation.  History of Present Illness:  The patient returns to the office for surveillance of an abdominal aortic aneurysm status post stent graft placement on 06/17/2022.    Patient denies abdominal pain or back pain, no other abdominal complaints. No groin related complaints. No symptoms consistent with distal embolization No changes in claudication distance or new rest pain symptoms.    There have been no interval changes in his overall healthcare since his last visit.    Patient denies amaurosis fugax or TIA symptoms.  The patient denies recent episodes of angina or shortness of breath.    CT angiogram of the chest dated June 14, 2022 demonstrates ascending thoracic aortic aneurysm measuring 4.7 cm in diameter.  Duplex ultrasound of the abdominal aorta demonstrates a aneurysm sac measuring 4.83 cm.  Stent graft is patent no evidence of endoleak.  The aneurysm sac is slightly smaller than the previous study.      Current Meds  Medication Sig   acetaminophen (TYLENOL) 500 MG tablet Take 1-2 tablets (500-1,000 mg total) by mouth every 6 (six) hours as needed for mild pain (pain score 1-3), fever or headache.   albuterol (VENTOLIN HFA) 108 (90 Base) MCG/ACT inhaler Inhale 1-2 puffs into the lungs every 6 (six) hours as needed for wheezing or shortness of breath.   apixaban (ELIQUIS) 5 MG TABS tablet Take 1 tablet (5 mg total) by mouth 2 (two) times daily.   atorvastatin (LIPITOR) 40 MG tablet Take 1 tablet (40 mg total) by mouth daily.   digoxin (LANOXIN) 0.125 MG tablet Take 0.5 tablet daily. (0.0625 mg)   empagliflozin (JARDIANCE) 10 MG TABS tablet Take 1 tablet (10 mg total) by mouth daily.   folic acid (FOLVITE) 1 MG tablet TAKE 1 TABLET BY MOUTH EVERY DAY   furosemide (LASIX) 40 MG tablet Take 40 mg (1  tablet) in the morning, and 20 mg (0.5 tablet) at night.   ibuprofen (ADVIL) 200 MG tablet Take 4 tablets (800 mg total) by mouth every 8 (eight) hours as needed for moderate pain (pain score 4-6).   metFORMIN (GLUCOPHAGE) 500 MG tablet TAKE 1 TABLET BY MOUTH 2 TIMES DAILY WITH A MEAL.   metoprolol succinate (TOPROL-XL) 50 MG 24 hr tablet Take 3 tablets (150 mg total) by mouth daily. TAKE WITH OR IMMEDIATELY FOLLOWING A MEAL.   pantoprazole (PROTONIX) 40 MG tablet Take 1 tablet (40 mg total) by mouth 2 (two) times daily.   potassium chloride SA (KLOR-CON M) 20 MEQ tablet Take 20 mEq by mouth 2 (two) times daily.   prednisoLONE acetate (PRED FORTE) 1 % ophthalmic suspension 1 drop 4 (four) times daily.   sacubitril-valsartan (ENTRESTO) 97-103 MG Take 1 tablet by mouth 2 (two) times daily.   spironolactone (ALDACTONE) 25 MG tablet Take 1 tablet (25 mg total) by mouth daily.   Tiotropium Bromide-Olodaterol (STIOLTO RESPIMAT) 2.5-2.5 MCG/ACT AERS Inhale 2 puffs into the lungs daily.    Past Medical History:  Diagnosis Date   Arthritis  Ascending aortic aneurysm (HCC)    a. 09/2017 Stable TAA - 5.1cm.   Asthma    Chronic systolic CHF (congestive heart failure) (HCC)    a. EF 25-30% by echo in 07/2016 with cath showing no significant CAD b. 01/2017: EF 30-35% with diffuse HK and moderate MR; c. 07/2017 Echo: EF 30-35%, diff hK. Mild MR, mildly dil LA. PASP .   Diverticulitis    Diverticulitis of large intestine with perforation without abscess or bleeding 05/13/2017   Hypertension    Hyperthyroidism    NICM (nonischemic cardiomyopathy) (HCC)    Noncompliance    Persistent atrial fibrillation (HCC)    a. CHA2DS2VASc = 3-->Eliquis (? compliance).    Past Surgical History:  Procedure Laterality Date   CARDIOVERSION N/A 06/23/2022   Procedure: CARDIOVERSION;  Surgeon: Dorthula Nettles, DO;  Location: [facility] ORS;  Service: Cardiovascular;  Laterality: N/A;   CARDIOVERSION N/A 06/25/2022    Procedure: CARDIOVERSION;  Surgeon: Dorthula Nettles, DO;  Location: [facility] ORS;  Service: Cardiovascular;  Laterality: N/A;   CARDIOVERSION N/A 09/29/2022   Procedure: CARDIOVERSION;  Surgeon: Laurey Morale, MD;  Location: [facility] INVASIVE CV LAB;  Service: Cardiovascular;  Laterality: N/A;   COLONOSCOPY N/A 08/27/2019   Procedure: COLONOSCOPY;  Surgeon: Toledo, Boykin Nearing, MD;  Location: [facility] ENDOSCOPY;  Service: Gastroenterology;  Laterality: N/A;   ENDOVASCULAR REPAIR/STENT GRAFT N/A 06/17/2022   Procedure: ENDOVASCULAR REPAIR/STENT GRAFT;  Surgeon: Annice Needy, MD;  Location: [facility] INVASIVE CV LAB;  Service: Cardiovascular;  Laterality: N/A;   ICD IMPLANT N/A 11/03/2022   Procedure: ICD IMPLANT;  Surgeon: Lanier Prude, MD;  Location: [facility] INVASIVE CV LAB;  Service: Cardiovascular;  Laterality: N/A;   JOINT REPLACEMENT Right    RIGHT HEART CATH Right 09/21/2022   Procedure: RIGHT HEART CATH;  Surgeon: Laurey Morale, MD;  Location: [facility] INVASIVE CV LAB;  Service: Cardiovascular;  Laterality: Right;   RIGHT/LEFT HEART CATH AND CORONARY ANGIOGRAPHY N/A 08/02/2016   Procedure: Right/Left Heart Cath and Coronary Angiography;  Surgeon: Iran Ouch, MD;  Location: [facility] INVASIVE CV LAB;  Service: Cardiovascular;  Laterality: N/A;   TEE WITHOUT CARDIOVERSION N/A 06/23/2022   Procedure: TRANSESOPHAGEAL ECHOCARDIOGRAM;  Surgeon: Dorthula Nettles, DO;  Location: [facility] ORS;  Service: Cardiovascular;  Laterality: N/A;   TESTICLE SURGERY     Patient states that he had to have the tube fixed.    Social History Social History   Tobacco Use   Smoking status: Some Days    Current packs/day: 0.00    Types: Pipe, Cigarettes    Last attempt to quit: 12/27/2022    Years since quitting: 0.3   Smokeless tobacco: Never  Vaping Use   Vaping status: Never Used  Substance Use Topics   Alcohol use: Yes    Alcohol/week: 2.0 standard drinks of alcohol    Types: 2 Cans of beer per week    Comment: 2 40oz  beers/week   Drug use: No    Family History Family History  Problem Relation Age of Onset   Diabetes Mother    Diabetes Father    Heart disease Brother    Heart attack Brother    Diabetes Brother    Kidney failure Brother    Thyroid disease Neg Hx     No Known Allergies   REVIEW OF SYSTEMS (Negative unless checked)  Constitutional: [] Weight loss  [] Fever  [] Chills Cardiac: [] Chest pain   [] Chest pressure   [] Palpitations   [] Shortness of breath when laying flat   []   Shortness of breath with exertion. Vascular:  [x] Pain in legs with walking   [] Pain in legs at rest  [] History of DVT   [] Phlebitis   [] Swelling in legs   [] Varicose veins   [] Non-healing ulcers Pulmonary:   [] Uses home oxygen   [] Productive cough   [] Hemoptysis   [] Wheeze  [] COPD   [] Asthma Neurologic:  [] Dizziness   [] Seizures   [] History of stroke   [] History of TIA  [] Aphasia   [] Vissual changes   [] Weakness or numbness in arm   [] Weakness or numbness in leg Musculoskeletal:   [] Joint swelling   [] Joint pain   [] Low back pain Hematologic:  [] Easy bruising  [] Easy bleeding   [] Hypercoagulable state   [] Anemic Gastrointestinal:  [] Diarrhea   [] Vomiting  [] Gastroesophageal reflux/heartburn   [] Difficulty swallowing. Genitourinary:  [] Chronic kidney disease   [] Difficult urination  [] Frequent urination   [] Blood in urine Skin:  [] Rashes   [] Ulcers  Psychological:  [] History of anxiety   []  History of major depression.  Physical Examination  Vitals:   05/02/23 1458  BP: 109/76  Pulse: 79  Resp: 16   There is no height or weight on file to calculate BMI. Gen: WD/WN, NAD Head: [place]/AT, No temporalis wasting.  Ear/Nose/Throat: Hearing grossly intact, nares w/o erythema or drainage Eyes: PER, EOMI, sclera nonicteric.  Neck: Supple, no masses.  No bruit or JVD.  Pulmonary:  Good air movement, no audible wheezing, no use of accessory muscles.  Cardiac: RRR, normal S1, S2, no Murmurs. Vascular:  mild trophic changes,  no open wounds Vessel Right Left  Radial Palpable Palpable  Gastrointestinal: soft, non-distended. No guarding/no peritoneal signs.  Musculoskeletal: M/S 5/5 throughout.  No visible deformity.  Neurologic: CN 2-12 intact. Pain and light touch intact in extremities.  Symmetrical.  Speech is fluent. Motor exam as listed above. Psychiatric: Judgment intact, Mood & affect appropriate for pt's clinical situation. Dermatologic: No rashes or ulcers noted.  No changes consistent with cellulitis.   CBC Lab Results  Component Value Date   WBC 3.4 (L) 02/15/2023   HGB 12.7 (L) 02/15/2023   HCT 38.9 (L) 02/15/2023   MCV 91.1 02/15/2023   PLT 150 02/15/2023    BMET    Component Value Date/Time   NA 134 (L) 04/22/2023 1440   NA 138 01/10/2023 1017   K 4.6 04/22/2023 1440   CL 102 04/22/2023 1440   CO2 23 04/22/2023 1440   GLUCOSE 98 04/22/2023 1440   BUN 15 04/22/2023 1440   BUN 26 01/10/2023 1017   CREATININE 0.87 04/22/2023 1440   CALCIUM 8.8 (L) 04/22/2023 1440   GFRNONAA >60 04/22/2023 1440   GFRAA 96 03/28/2020 1524   Estimated Creatinine Clearance: 106.7 mL/min (by C-G formula based on SCr of 0.87 mg/dL).  COAG Lab Results  Component Value Date   INR 1.2 06/25/2022   INR 1.2 06/25/2022   INR 1.2 06/22/2022    Radiology VAS [place] EVAR DUPLEX  Result Date: 05/03/2023 Endovascular Aortic Repair Study (EVAR) Patient Name:  DAYLAN GAWRON  Date of Exam:   05/02/2023 Medical Rec #: [id number]       Accession #:    [id number] Date of Birth: October 04, 1950       Patient Gender: M Patient Age:   72 years Exam Location:  [facility] Procedure:      VAS [place] EVAR DUPLEX Referring Phys: Levora Dredge --------------------------------------------------------------------------------  Indications: Follow up exam for EVAR. Vascular Interventions: 06/17/2022: EVAR placement.  Performing Technologist: Lynnea Ferrier  [facility] RVS  Examination Guidelines: A complete evaluation includes B-mode  imaging, spectral Doppler, color Doppler, and power Doppler as needed of all accessible portions of each vessel. Bilateral testing is considered an integral part of a complete examination. Limited examinations for reoccurring indications may be performed as noted.  Endovascular Aortic Repair (EVAR): +----------+----------------+-------------------+-------------------+           Diameter AP (cm)Diameter Trans (cm)Velocities (cm/sec) +----------+----------------+-------------------+-------------------+ Aorta     4.70            4.83               52                  +----------+----------------+-------------------+-------------------+ Right Limb1.56            1.66               59                  +----------+----------------+-------------------+-------------------+ Left Limb 1.96            2.16               59                  +----------+----------------+-------------------+-------------------+  Summary: Abdominal Aorta: Patent endovascular aneurysm repair with no evidence of endoleak. Initial Imaging of the EVAR appears to be decreased compared to pre EVAR of greater than 5 cm; Today's measurement is 4.83 cm.  *See table(s) above for measurements and observations.  Electronically signed by Levora Dredge MD on 05/03/2023 at 7:17:51 AM.   Final      Assessment/Plan There are no diagnoses linked to this encounter.   Levora Dredge, MD  05/04/2023 8:05 AM

## 2023-05-04 NOTE — Telephone Encounter (Signed)
Note recent office visit with Dr. Shirlee Latch 04/22/23, noted tachycardia recently and increased Toprol XL to 150mg  daily at that visit. Patient has f/u in 2 months to reassess. Note decrease in ventricular rates on graph, will close encounter since medication was increased and rates have decreased.

## 2023-05-06 ENCOUNTER — Ambulatory Visit (INDEPENDENT_AMBULATORY_CARE_PROVIDER_SITE_OTHER): Payer: Medicare HMO

## 2023-05-06 DIAGNOSIS — I428 Other cardiomyopathies: Secondary | ICD-10-CM | POA: Diagnosis not present

## 2023-05-06 LAB — CUP PACEART REMOTE DEVICE CHECK
Battery Voltage: 3.07 V
Brady Statistic RV Percent Paced: 0 %
Date Time Interrogation Session: 20241129080728
HighPow Impedance: 81 Ohm
Implantable Lead Connection Status: 753985
Implantable Lead Implant Date: 20240529
Implantable Lead Location: 753860
Implantable Lead Model: 436909
Implantable Lead Serial Number: 81545001
Implantable Pulse Generator Implant Date: 20240529
Lead Channel Impedance Value: 521 Ohm
Lead Channel Pacing Threshold Amplitude: 0.5 V
Lead Channel Pacing Threshold Pulse Width: 0.4 ms
Lead Channel Sensing Intrinsic Amplitude: 0.5 mV
Lead Channel Sensing Intrinsic Amplitude: 9.1 mV
Lead Channel Setting Pacing Amplitude: 1.5 V
Lead Channel Setting Pacing Pulse Width: 0.4 ms
Pulse Gen Model: 429525
Pulse Gen Serial Number: 84937644
Zone Setting Status: 755011

## 2023-05-10 ENCOUNTER — Other Ambulatory Visit: Payer: Self-pay | Admitting: Family Medicine

## 2023-05-12 NOTE — Telephone Encounter (Signed)
Requested by interface surescripts. Future visit in 2 months. Requested Prescriptions  Pending Prescriptions Disp Refills   furosemide (LASIX) 40 MG tablet [Pharmacy Med Name: FUROSEMIDE 40 MG TABLET] 90 tablet 1    Sig: TAKE 1 TABLET BY MOUTH EVERY DAY     Cardiovascular:  Diuretics - Loop Failed - 05/10/2023  2:39 AM      Failed - Ca in normal range and within 180 days    Calcium  Date Value Ref Range Status  04/22/2023 8.8 (L) 8.9 - 10.3 mg/dL Final   Calcium, Ion  Date Value Ref Range Status  09/21/2022 1.19 1.15 - 1.40 mmol/L Final  09/21/2022 1.20 1.15 - 1.40 mmol/L Final         Failed - Na in normal range and within 180 days    Sodium  Date Value Ref Range Status  04/22/2023 134 (L) 135 - 145 mmol/L Final  01/10/2023 138 134 - 144 mmol/L Final         Failed - Mg Level in normal range and within 180 days    Magnesium  Date Value Ref Range Status  06/22/2022 2.2 1.7 - 2.4 mg/dL Final    Comment:    Performed at [facility], [address], [place], [place] [address]         Passed - K in normal range and within 180 days    Potassium  Date Value Ref Range Status  04/22/2023 4.6 3.5 - 5.1 mmol/L Final         Passed - Cr in normal range and within 180 days    Creatinine, Ser  Date Value Ref Range Status  04/22/2023 0.87 0.61 - 1.24 mg/dL Final         Passed - Cl in normal range and within 180 days    Chloride  Date Value Ref Range Status  04/22/2023 102 98 - 111 mmol/L Final         Passed - Last BP in normal range    BP Readings from Last 1 Encounters:  05/02/23 109/76         Passed - Valid encounter within last 6 months    Recent Outpatient Visits           1 month ago Diabetes mellitus with cardiac complication [facility])   Eugene Henderson, Eugene Henderson, Eugene Henderson   2 months ago Urinary tract infection without hematuria, site unspecified   [place] [facility] Eugene Henderson, Eugene Henderson, Eugene Henderson   4 months ago Diabetes  mellitus with cardiac complication [facility])   [place] [facility] [place], Eugene Henderson, Eugene Henderson   7 months ago Diabetes mellitus with cardiac complication [facility])   [place] [facility] [place], Eugene Henderson, Eugene Henderson   8 months ago Post-traumatic osteoarthritis of right knee   Eugene Henderson, Eugene Henderson, Eugene Henderson       Future Appointments             In 2 months Eugene Henderson, Eugene Henderson, Eugene Henderson [place] [facility]             atorvastatin (LIPITOR) 40 MG tablet [Pharmacy Med Name: ATORVASTATIN 40 MG TABLET] 90 tablet 1    Sig: TAKE 1 TABLET BY MOUTH EVERY DAY     Cardiovascular:  Antilipid - Statins Failed - 05/10/2023  2:39 AM      Failed - Lipid Panel in normal range within the last 12 months  Cholesterol, Total  Date Value Ref Range Status  01/10/2023 124 100 - 199 mg/dL Final   LDL Chol Calc (NIH)  Date Value Ref Range Status  01/10/2023 34 0 - 99 mg/dL Final   HDL  Date Value Ref Range Status  01/10/2023 70 >39 mg/dL Final   Triglycerides  Date Value Ref Range Status  01/10/2023 110 0 - 149 mg/dL Final         Passed - Patient is not pregnant      Passed - Valid encounter within last 12 months    Recent Outpatient Visits           1 month ago Diabetes mellitus with cardiac complication (HCC)   [place] [facility] [place], Eugene Henderson, Eugene Henderson   2 months ago Urinary tract infection without hematuria, site unspecified   [place] [facility] Eugene Henderson, Eugene Henderson, Eugene Henderson   4 months ago Diabetes mellitus with cardiac complication [facility])   [facility] Eugene Henderson, Eugene Henderson, Eugene Henderson   7 months ago Diabetes mellitus with cardiac complication [facility])   [place] [facility] Eugene Henderson, Eugene Henderson, Eugene Henderson   8 months ago Post-traumatic osteoarthritis of right knee   [place] [facility] [place], Eugene Henderson, Eugene Henderson       Future Appointments             In 2 months Eugene Henderson, Eugene Henderson, Eugene Henderson [place] [facility], [facility]

## 2023-05-12 NOTE — Telephone Encounter (Signed)
Requested medication (s) are due for refill today: yes   Requested medication (s) are on the active medication list: yes   Last refill:  02/15/23 #135 3 refills  Future visit scheduled: yes in 2 months  Notes to clinic:  last ordered by Marca Ancona, MD . Do you want to refill Rx?     Requested Prescriptions  Pending Prescriptions Disp Refills   furosemide (LASIX) 40 MG tablet [Pharmacy Med Name: FUROSEMIDE 40 MG TABLET] 90 tablet 1    Sig: TAKE 1 TABLET BY MOUTH EVERY DAY     Cardiovascular:  Diuretics - Loop Failed - 05/10/2023  2:39 AM      Failed - Ca in normal range and within 180 days    Calcium  Date Value Ref Range Status  04/22/2023 8.8 (L) 8.9 - 10.3 mg/dL Final   Calcium, Ion  Date Value Ref Range Status  09/21/2022 1.19 1.15 - 1.40 mmol/L Final  09/21/2022 1.20 1.15 - 1.40 mmol/L Final         Failed - Na in normal range and within 180 days    Sodium  Date Value Ref Range Status  04/22/2023 134 (L) 135 - 145 mmol/L Final  01/10/2023 138 134 - 144 mmol/L Final         Failed - Mg Level in normal range and within 180 days    Magnesium  Date Value Ref Range Status  06/22/2022 2.2 1.7 - 2.4 mg/dL Final    Comment:    Performed at [facility], [address], [place], [place] [address]         Passed - K in normal range and within 180 days    Potassium  Date Value Ref Range Status  04/22/2023 4.6 3.5 - 5.1 mmol/L Final         Passed - Cr in normal range and within 180 days    Creatinine, Ser  Date Value Ref Range Status  04/22/2023 0.87 0.61 - 1.24 mg/dL Final         Passed - Cl in normal range and within 180 days    Chloride  Date Value Ref Range Status  04/22/2023 102 98 - 111 mmol/L Final         Passed - Last BP in normal range    BP Readings from Last 1 Encounters:  05/02/23 109/76         Passed - Valid encounter within last 6 months    Recent Outpatient Visits           1 month ago Diabetes mellitus with cardiac  complication [facility])   [place] [facility] [place], Megan P, DO   2 months ago Urinary tract infection without hematuria, site unspecified   [place] [facility] M[place], Erin E, PA-C   4 months ago Diabetes mellitus with cardiac complication [facility])   [place] [facility] [place], Megan P, DO   7 months ago Diabetes mellitus with cardiac complication [facility])   [place] [facility] [place], Megan P, DO   8 months ago Post-traumatic osteoarthritis of right knee   [place] [facility] [place], Oralia Rud, DO       Future Appointments             In 2 months Laural Benes, Oralia Rud, DO [place] [facility], [facility]            Signed Prescriptions Disp Refills   atorvastatin (LIPITOR)  40 MG tablet 90 tablet 1    Sig: TAKE 1 TABLET BY MOUTH EVERY DAY     Cardiovascular:  Antilipid - Statins Failed - 05/10/2023  2:39 AM      Failed - Lipid Panel in normal range within the last 12 months    Cholesterol, Total  Date Value Ref Range Status  01/10/2023 124 100 - 199 mg/dL Final   LDL Chol Calc (NIH)  Date Value Ref Range Status  01/10/2023 34 0 - 99 mg/dL Final   HDL  Date Value Ref Range Status  01/10/2023 70 >39 mg/dL Final   Triglycerides  Date Value Ref Range Status  01/10/2023 110 0 - 149 mg/dL Final         Passed - Patient is not pregnant      Passed - Valid encounter within last 12 months    Recent Outpatient Visits           1 month ago Diabetes mellitus with cardiac complication (HCC)   [place] [facility] [place], Megan P, DO   2 months ago Urinary tract infection without hematuria, site unspecified   [place] [facility] Mecum, Erin E, PA-C   4 months ago Diabetes mellitus with cardiac complication [facility])   [place] [facility] [place], Megan P, DO   7 months ago Diabetes mellitus with cardiac complication [facility])   [place] [facility] [place], Megan P, DO   8 months ago Post-traumatic osteoarthritis of right knee   [facility] [place], Oralia Rud, DO       Future Appointments             In 2 months Laural Benes, Oralia Rud, DO  [facility], [facility]

## 2023-05-30 NOTE — Progress Notes (Signed)
Remote ICD transmission.   

## 2023-06-08 ENCOUNTER — Other Ambulatory Visit: Payer: Self-pay | Admitting: Family Medicine

## 2023-06-10 ENCOUNTER — Other Ambulatory Visit: Payer: Self-pay | Admitting: Cardiology

## 2023-06-11 NOTE — Telephone Encounter (Signed)
 Change in pharmacy requested. Requested Prescriptions  Pending Prescriptions Disp Refills   metFORMIN  (GLUCOPHAGE ) 500 MG tablet [Pharmacy Med Name: METFORMIN  HCL 500 MG TABLET] 180 tablet 1    Sig: TAKE 1 TABLET BY MOUTH TWICE A DAY WITH FOOD     Endocrinology:  Diabetes - Biguanides Failed - 06/11/2023  1:00 PM      Failed - B12 Level in normal range and within 720 days    Vitamin B-12  Date Value Ref Range Status  06/18/2021 304 232 - 1,245 pg/mL Final         Passed - Cr in normal range and within 360 days    Creatinine, Ser  Date Value Ref Range Status  04/22/2023 0.87 0.61 - 1.24 mg/dL Final         Passed - HBA1C is between 0 and 7.9 and within 180 days    HB A1C (BAYER DCA - WAIVED)  Date Value Ref Range Status  04/12/2023 6.2 (H) 4.8 - 5.6 % Final    Comment:             Prediabetes: 5.7 - 6.4          Diabetes: >6.4          Glycemic control for adults with diabetes: <7.0          Passed - eGFR in normal range and within 360 days    GFR calc Af Amer  Date Value Ref Range Status  03/28/2020 96 >59 mL/min/1.73 Final    Comment:    **In accordance with recommendations from the NKF-ASN Task force,**   [facility] is in the process of updating its eGFR calculation to the   2021 CKD-EPI creatinine equation that estimates kidney function   without a race variable.    GFR, Estimated  Date Value Ref Range Status  04/22/2023 >60 >60 mL/min Final    Comment:    (NOTE) Calculated using the CKD-EPI Creatinine Equation (2021)    eGFR  Date Value Ref Range Status  01/10/2023 82 >59 mL/min/1.73 Final         Passed - Valid encounter within last 6 months    Recent Outpatient Visits           2 months ago Diabetes mellitus with cardiac complication (HCC)   [place] [facility] [place], Megan P, DO   3 months ago Urinary tract infection without hematuria, site unspecified   [place] [facility] Mecum, Erin E, PA-C   5 months ago Diabetes  mellitus with cardiac complication [place] [facility])   [place] [facility] [place], Megan P, DO   8 months ago Diabetes mellitus with cardiac complication [facility])   [place] [facility] [place], Megan P, DO   9 months ago Post-traumatic osteoarthritis of right knee   [place] [facility] [place], [place], DO       Future Appointments             In 1 month [place], Megan P, DO  [facility], [facility]            Passed - CBC within normal limits and completed in the last 12 months    WBC  Date Value Ref Range Status  02/15/2023 3.4 (L) 4.0 - 10.5 K/uL Final   RBC  Date Value Ref Range Status  02/15/2023 4.27 4.22 - 5.81 MIL/uL Final   Hemoglobin  Date Value Ref Range Status  02/15/2023 12.7 (L)  13.0 - 17.0 g/dL Final  91/94/7975 85.2 13.0 - 17.7 g/dL Final   HCT  Date Value Ref Range Status  02/15/2023 38.9 (L) 39.0 - 52.0 % Final   Hematocrit  Date Value Ref Range Status  01/10/2023 42.9 37.5 - 51.0 % Final   MCHC  Date Value Ref Range Status  02/15/2023 32.6 30.0 - 36.0 g/dL Final   [facility]  Date Value Ref Range Status  02/15/2023 29.7 26.0 - 34.0 pg Final   MCV  Date Value Ref Range Status  02/15/2023 91.1 80.0 - 100.0 fL Final  01/10/2023 89 79 - 97 fL Final   No results found for: PLTCOUNTKUC, LABPLAT, POCPLA RDW  Date Value Ref Range Status  02/15/2023 14.4 11.5 - 15.5 % Final  01/10/2023 14.2 11.6 - 15.4 % Final          pantoprazole  (PROTONIX ) 40 MG tablet [Pharmacy Med Name: PANTOPRAZOLE  SOD DR 40 MG TAB] 180 tablet 1    Sig: TAKE 1 TABLET BY MOUTH TWICE A DAY     Gastroenterology: Proton Pump Inhibitors Passed - 06/11/2023  1:00 PM      Passed - Valid encounter within last 12 months    Recent Outpatient Visits           2 months ago Diabetes mellitus with cardiac complication (HCC)   [place] [facility] [place], Megan P, DO   3 months ago Urinary tract infection  without hematuria, site unspecified   [place] [facility] Mecum, Erin E, PA-C   5 months ago Diabetes mellitus with cardiac complication [facility])   [place] [facility] [place], Megan P, DO   8 months ago 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

## 2023-06-13 NOTE — Telephone Encounter (Signed)
 Last office visit: 04/12/2023

## 2023-06-13 NOTE — Telephone Encounter (Signed)
 Prescription refill request for Eliquis received. Indication:afib Last office visit:11/24 Scr:0.87  11/24 Age: 73 Weight:125.8  kg  Prescription refilled

## 2023-07-04 ENCOUNTER — Other Ambulatory Visit: Payer: Self-pay | Admitting: Family Medicine

## 2023-07-07 NOTE — Telephone Encounter (Signed)
Requested Prescriptions  Pending Prescriptions Disp Refills   albuterol (VENTOLIN HFA) 108 (90 Base) MCG/ACT inhaler [Pharmacy Med Name: ALBUTEROL HFA (PROVENTIL) INH] 6.7 each 0    Sig: INHALE 2 PUFFS BY MOUTH EVERY 6 HOURS AS NEEDED FOR WHEEZE OR SHORTNESS OF BREATH     Pulmonology:  Beta Agonists 2 Passed - 07/07/2023  9:22 AM      Passed - Last BP in normal range    BP Readings from Last 1 Encounters:  05/02/23 109/76         Passed - Last Heart Rate in normal range    Pulse Readings from Last 1 Encounters:  05/02/23 79         Passed - Valid encounter within last 12 months    Recent Outpatient Visits           2 months ago Diabetes mellitus with cardiac complication (HCC)   [place] [facility], Megan P, DO   3 months ago Urinary tract infection without hematuria, site unspecified   [place] [facility] Mecum, Erin E, PA-C   5 months ago Diabetes mellitus with cardiac complication [facility])   [place] [facility] [place], Megan P, DO   9 months ago Diabetes mellitus with cardiac complication [facility])   [place] [facility] [place], Megan P, DO   10 months ago Post-traumatic osteoarthritis of right knee   [place] [facility] [place], Oralia Rud, DO       Future Appointments             In 6 days Dorcas Carrow, DO [place] [facility], [facility]

## 2023-07-13 ENCOUNTER — Ambulatory Visit: Payer: Self-pay | Admitting: Family Medicine

## 2023-07-13 ENCOUNTER — Encounter: Payer: Self-pay | Admitting: Family Medicine

## 2023-07-13 VITALS — BP 118/79 | HR 69 | Temp 98.5°F | Resp 14 | Ht 72.99 in | Wt 270.0 lb

## 2023-07-13 DIAGNOSIS — Z7984 Long term (current) use of oral hypoglycemic drugs: Secondary | ICD-10-CM | POA: Diagnosis not present

## 2023-07-13 DIAGNOSIS — I10 Essential (primary) hypertension: Secondary | ICD-10-CM | POA: Diagnosis not present

## 2023-07-13 DIAGNOSIS — B182 Chronic viral hepatitis C: Secondary | ICD-10-CM

## 2023-07-13 DIAGNOSIS — E1159 Type 2 diabetes mellitus with other circulatory complications: Secondary | ICD-10-CM | POA: Diagnosis not present

## 2023-07-13 DIAGNOSIS — R197 Diarrhea, unspecified: Secondary | ICD-10-CM | POA: Diagnosis not present

## 2023-07-13 DIAGNOSIS — E0590 Thyrotoxicosis, unspecified without thyrotoxic crisis or storm: Secondary | ICD-10-CM

## 2023-07-13 DIAGNOSIS — E785 Hyperlipidemia, unspecified: Secondary | ICD-10-CM | POA: Diagnosis not present

## 2023-07-13 LAB — BAYER DCA HB A1C WAIVED: HB A1C (BAYER DCA - WAIVED): 6.6 % — ABNORMAL HIGH (ref 4.8–5.6)

## 2023-07-13 LAB — MICROALBUMIN, URINE WAIVED
Creatinine, Urine Waived: 200 mg/dL (ref 10–300)
Microalb, Ur Waived: 150 mg/L — ABNORMAL HIGH (ref 0–19)

## 2023-07-13 NOTE — Progress Notes (Signed)
 BP 118/79 (BP Location: Left Arm, Patient Position: Sitting, Cuff Size: Large)   Pulse 69   Temp 98.5 F (36.9 C) (Oral)   Resp 14   Ht 6' 0.99 (1.854 m)   Wt 270 lb (122.5 kg)   SpO2 96%   BMI 35.63 kg/m    Subjective:    Patient ID: Eugene Henderson, male    DOB: 02-25-1951, 73 y.o.   MRN: [id number]  HPI: Eugene Henderson is a 73 y.o. male  Chief Complaint  Patient presents with   Diabetes   ABDOMINAL ISSUES Duration: 3 weeks Nature: diarrhea Frequency: 2-5x a day Alleviating factors: imodium Aggravating factors: nothing Treatments attempted: imodium Constipation: no Diarrhea: yes Episodes of diarrhea/day: 2-5x a day Mucous in the stool: no Heartburn: no Bloating:no Flatulence: no Nausea: no Vomiting: no Melena or hematochezia: no Rash: no Jaundice: no Fever: yes Weight loss: yes  DIABETES Hypoglycemic episodes:no Polydipsia/polyuria: no Visual disturbance: no Chest pain: no Paresthesias: no Glucose Monitoring: no Taking Insulin?: no Blood Pressure Monitoring: not checking Retinal Examination: Up to Date Foot Exam: Up to Date Diabetic Education: Completed Pneumovax: Up to Date Influenza: Up to Date Aspirin : no  HYPERTENSION / HYPERLIPIDEMIA Satisfied with current treatment? yes Duration of hypertension: chronic BP monitoring frequency: not checking BP medication side effects: no Past BP meds: lasix , digoxin , metoprolol , spironalactone, entresto  Duration of hyperlipidemia: chronic Cholesterol medication side effects: no Cholesterol supplements: none Past cholesterol medications: atorvastatin  Medication compliance: excellent compliance Aspirin : no Recent stressors: no Recurrent headaches: no Visual changes: no Palpitations: no Dyspnea: no Chest pain: no Lower extremity edema: no Dizzy/lightheaded: no   Relevant past medical, surgical, family and social history reviewed and updated as indicated. Interim medical history since our last  visit reviewed. Allergies and medications reviewed and updated.  Review of Systems  Constitutional: Negative.   HENT: Negative.    Respiratory: Negative.    Cardiovascular: Negative.   Gastrointestinal:  Positive for abdominal pain, diarrhea, nausea and vomiting. Negative for abdominal distention, anal bleeding, blood in stool, constipation and rectal pain.  Musculoskeletal: Negative.   Skin: Negative.   Psychiatric/Behavioral: Negative.      Per HPI unless specifically indicated above     Objective:    BP 118/79 (BP Location: Left Arm, Patient Position: Sitting, Cuff Size: Large)   Pulse 69   Temp 98.5 F (36.9 C) (Oral)   Resp 14   Ht 6' 0.99 (1.854 m)   Wt 270 lb (122.5 kg)   SpO2 96%   BMI 35.63 kg/m   Wt Readings from Last 3 Encounters:  07/13/23 270 lb (122.5 kg)  04/22/23 277 lb 4 oz (125.8 kg)  04/12/23 271 lb (122.9 kg)    Physical Exam Vitals and nursing note reviewed.  Constitutional:      General: He is not in acute distress.    Appearance: Normal appearance. He is obese. He is not ill-appearing, toxic-appearing or diaphoretic.  HENT:     Head: Normocephalic and atraumatic.     Right Ear: External ear normal.     Left Ear: External ear normal.     Nose: Nose normal.     Mouth/Throat:     Mouth: Mucous membranes are moist.     Pharynx: Oropharynx is clear.  Eyes:     General: No scleral icterus.       Right eye: No discharge.        Left eye: No discharge.     Extraocular Movements: Extraocular movements intact.  Conjunctiva/sclera: Conjunctivae normal.     Pupils: Pupils are equal, round, and reactive to light.  Cardiovascular:     Rate and Rhythm: Normal rate and regular rhythm.     Pulses: Normal pulses.     Heart sounds: Normal heart sounds. No murmur heard.    No friction rub. No gallop.  Pulmonary:     Effort: Pulmonary effort is normal. No respiratory distress.     Breath sounds: Normal breath sounds. No stridor. No wheezing, rhonchi  or rales.  Chest:     Chest wall: No tenderness.  Abdominal:     General: Abdomen is flat. Bowel sounds are normal. There is no distension.     Palpations: There is no mass.     Tenderness: There is no abdominal tenderness. There is no right CVA tenderness, left CVA tenderness, guarding or rebound.     Hernia: No hernia is present.  Musculoskeletal:        General: Normal range of motion.     Cervical back: Normal range of motion and neck supple.  Skin:    General: Skin is warm and dry.     Capillary Refill: Capillary refill takes less than 2 seconds.     Coloration: Skin is not jaundiced or pale.     Findings: No bruising, erythema, lesion or rash.  Neurological:     General: No focal deficit present.     Mental Status: He is alert and oriented to person, place, and time. Mental status is at baseline.  Psychiatric:        Mood and Affect: Mood normal.        Behavior: Behavior normal.        Thought Content: Thought content normal.        Judgment: Judgment normal.     Results for orders placed or performed in visit on 07/13/23  Bayer DCA Hb A1c Waived   Collection Time: 07/13/23  9:58 AM  Result Value Ref Range   HB A1C (BAYER DCA - WAIVED) 6.6 (H) 4.8 - 5.6 %  Microalbumin, Urine Waived   Collection Time: 07/13/23  9:58 AM  Result Value Ref Range   Microalb, Ur Waived 150 (H) 0 - 19 mg/L   Creatinine, Urine Waived 200 10 - 300 mg/dL   Microalb/Creat Ratio 30-300 (H) <30 mg/g  CBC with Differential/Platelet   Collection Time: 07/13/23 10:01 AM  Result Value Ref Range   WBC 3.9 3.4 - 10.8 x10E3/uL   RBC 4.64 4.14 - 5.80 x10E6/uL   Hemoglobin 13.4 13.0 - 17.7 g/dL   Hematocrit 59.6 62.4 - 51.0 %   MCV 87 79 - 97 fL   MCH 28.9 26.6 - 33.0 pg   MCHC 33.3 31.5 - 35.7 g/dL   RDW 85.7 88.3 - 84.5 %   Platelets 226 150 - 450 x10E3/uL   Neutrophils 41 Not Estab. %   Lymphs 42 Not Estab. %   Monocytes 12 Not Estab. %   Eos 4 Not Estab. %   Basos 1 Not Estab. %    Neutrophils Absolute 1.6 1.4 - 7.0 x10E3/uL   Lymphocytes Absolute 1.6 0.7 - 3.1 x10E3/uL   Monocytes Absolute 0.5 0.1 - 0.9 x10E3/uL   EOS (ABSOLUTE) 0.2 0.0 - 0.4 x10E3/uL   Basophils Absolute 0.0 0.0 - 0.2 x10E3/uL   Immature Granulocytes 0 Not Estab. %   Immature Grans (Abs) 0.0 0.0 - 0.1 x10E3/uL  Comprehensive metabolic panel   Collection Time: 07/13/23 10:01 AM  Result  Value Ref Range   Glucose 103 (H) 70 - 99 mg/dL   BUN 14 8 - 27 mg/dL   Creatinine, Ser 9.04 0.76 - 1.27 mg/dL   eGFR 85 >40 fO/fpw/8.26   BUN/Creatinine Ratio 15 10 - 24   Sodium 140 134 - 144 mmol/L   Potassium 4.0 3.5 - 5.2 mmol/L   Chloride 103 96 - 106 mmol/L   CO2 21 20 - 29 mmol/L   Calcium  8.8 8.6 - 10.2 mg/dL   Total Protein 7.0 6.0 - 8.5 g/dL   Albumin 4.2 3.8 - 4.8 g/dL   Globulin, Total 2.8 1.5 - 4.5 g/dL   Bilirubin Total 0.5 0.0 - 1.2 mg/dL   Alkaline Phosphatase 92 44 - 121 IU/L   AST 12 0 - 40 IU/L   ALT 9 0 - 44 IU/L  Lipid Panel w/o Chol/HDL Ratio   Collection Time: 07/13/23 10:01 AM  Result Value Ref Range   Cholesterol, Total 88 (L) 100 - 199 mg/dL   Triglycerides 90 0 - 149 mg/dL   HDL 47 >60 mg/dL   VLDL Cholesterol Cal 18 5 - 40 mg/dL   LDL Chol Calc (NIH) 23 0 - 99 mg/dL  TSH   Collection Time: 07/13/23 10:01 AM  Result Value Ref Range   TSH 4.400 0.450 - 4.500 uIU/mL  HCV RNA quant   Collection Time: 07/13/23 10:01 AM  Result Value Ref Range   Hepatitis C Quantitation WILL FOLLOW    HCV log10 WILL FOLLOW    Test Information Comment       Assessment & Plan:   Problem List Items Addressed This Visit       Cardiovascular and Mediastinum   Essential hypertension   Under good control on current regimen. Continue current regimen. Continue to monitor. Call with any concerns. Refills given. Labs drawn today.        Relevant Orders   CBC with Differential/Platelet (Completed)   Comprehensive metabolic panel (Completed)   Microalbumin, Urine Waived (Completed)    Diabetes mellitus with cardiac complication (HCC)   Doing well with A1c of 6.6. Continue current regimen. Continue to monitor. Call with any concerns.       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 Await results. Treat as needed.       Relevant Orders   CBC with Differential/Platelet (Completed)   Comprehensive metabolic panel (Completed)   HCV RNA quant (Completed)     Endocrine   Hyperthyroidism   Rechecking labs today. Await results. Treat as needed.       Relevant Orders   CBC with Differential/Platelet (Completed)   Comprehensive metabolic panel (Completed)   TSH (Completed)     Other   Hyperlipidemia   Under good control on current regimen. Continue current regimen. Continue to monitor. Call with any concerns. Refills given. Labs drawn today.        Relevant Orders   CBC with Differential/Platelet (Completed)   Comprehensive metabolic panel (Completed)   Lipid Panel w/o Chol/HDL Ratio (Completed)   Other Visit Diagnoses       Diarrhea, unspecified type    -  Primary   Will check labs and stool studies. Other members of household with similar symptoms. BRAT diet. Push fluids. Call if not getting better or getting worse.  Relevant Orders   Stool C-Diff Toxin Assay   Fecal leukocytes   Stool Culture   Ova and parasite examination   Fecal occult blood, imunochemical([facility])        Follow up plan: Return in about 3 months (around 10/10/2023) for physical.

## 2023-07-14 ENCOUNTER — Encounter: Payer: Self-pay | Admitting: Family Medicine

## 2023-07-14 NOTE — Assessment & Plan Note (Signed)
 Under good control on current regimen. Continue current regimen. Continue to monitor. Call with any concerns. Refills given. Labs drawn today.

## 2023-07-14 NOTE — Assessment & Plan Note (Signed)
Doing well with A1c of 6.6. Continue current regimen. Continue to monitor. Call with any concerns.  °

## 2023-07-14 NOTE — Assessment & Plan Note (Signed)
 Rechecking labs today. Await results. Treat as needed.

## 2023-07-14 NOTE — Assessment & Plan Note (Signed)
 Checking labs today. Await results. Treat as needed.

## 2023-07-15 LAB — CBC WITH DIFFERENTIAL/PLATELET
Basophils Absolute: 0 10*3/uL (ref 0.0–0.2)
Basos: 1 %
EOS (ABSOLUTE): 0.2 10*3/uL (ref 0.0–0.4)
Eos: 4 %
Hematocrit: 40.3 % (ref 37.5–51.0)
Hemoglobin: 13.4 g/dL (ref 13.0–17.7)
Immature Grans (Abs): 0 10*3/uL (ref 0.0–0.1)
Immature Granulocytes: 0 %
Lymphocytes Absolute: 1.6 10*3/uL (ref 0.7–3.1)
Lymphs: 42 %
MCH: 28.9 pg (ref 26.6–33.0)
MCHC: 33.3 g/dL (ref 31.5–35.7)
MCV: 87 fL (ref 79–97)
Monocytes Absolute: 0.5 10*3/uL (ref 0.1–0.9)
Monocytes: 12 %
Neutrophils Absolute: 1.6 10*3/uL (ref 1.4–7.0)
Neutrophils: 41 %
Platelets: 226 10*3/uL (ref 150–450)
RBC: 4.64 x10E6/uL (ref 4.14–5.80)
RDW: 14.2 % (ref 11.6–15.4)
WBC: 3.9 10*3/uL (ref 3.4–10.8)

## 2023-07-15 LAB — HCV RNA QUANT: Hepatitis C Quantitation: NOT DETECTED [IU]/mL

## 2023-07-15 LAB — COMPREHENSIVE METABOLIC PANEL
ALT: 9 [IU]/L (ref 0–44)
AST: 12 [IU]/L (ref 0–40)
Albumin: 4.2 g/dL (ref 3.8–4.8)
Alkaline Phosphatase: 92 [IU]/L (ref 44–121)
BUN/Creatinine Ratio: 15 (ref 10–24)
BUN: 14 mg/dL (ref 8–27)
Bilirubin Total: 0.5 mg/dL (ref 0.0–1.2)
CO2: 21 mmol/L (ref 20–29)
Calcium: 8.8 mg/dL (ref 8.6–10.2)
Chloride: 103 mmol/L (ref 96–106)
Creatinine, Ser: 0.95 mg/dL (ref 0.76–1.27)
Globulin, Total: 2.8 g/dL (ref 1.5–4.5)
Glucose: 103 mg/dL — ABNORMAL HIGH (ref 70–99)
Potassium: 4 mmol/L (ref 3.5–5.2)
Sodium: 140 mmol/L (ref 134–144)
Total Protein: 7 g/dL (ref 6.0–8.5)
eGFR: 85 mL/min/{1.73_m2} (ref >59)

## 2023-07-15 LAB — LIPID PANEL W/O CHOL/HDL RATIO
Cholesterol, Total: 88 mg/dL — ABNORMAL LOW (ref 100–199)
HDL: 47 mg/dL (ref >39)
LDL Chol Calc (NIH): 23 mg/dL (ref 0–99)
Triglycerides: 90 mg/dL (ref 0–149)
VLDL Cholesterol Cal: 18 mg/dL (ref 5–40)

## 2023-07-15 LAB — TSH: TSH: 4.4 u[IU]/mL (ref 0.450–4.500)

## 2023-07-17 ENCOUNTER — Encounter: Payer: Self-pay | Admitting: Family Medicine

## 2023-07-18 ENCOUNTER — Telehealth: Payer: Self-pay

## 2023-07-18 NOTE — Telephone Encounter (Signed)
 [employer] alert: Single Chamber ICD with DX lead. 2 ongoing nst episodes - appear AF with RVR V Rate of 157 ongoing at end of event.   LM to assess symptoms.  Appears patient seen by PCP on 2/5 with diarrhea.

## 2023-07-19 NOTE — Telephone Encounter (Signed)
Spoke to daughter. She is going to ask her dad to call us back in the device clinic.

## 2023-07-19 NOTE — Telephone Encounter (Signed)
[employer] alert: Continued NST events, appear AF/RVR. (Known history).

## 2023-07-20 NOTE — Telephone Encounter (Signed)
Please go over instructions of stool kit- this was discussed with him at his appointment and his daughter just did one, which is why it was not explicitly written out. If he has Ova, parasite and culture within 2-3 days that can be dropped off- c diff needed to be frozen (as the label on the tube says) and if they didn't freeze it, he will need to do another one

## 2023-07-20 NOTE — Telephone Encounter (Signed)
[employer] Continued Alerts: AF/RVR events.  LM for patient to call back and assess symptoms.  DUE For follow up with EP in March.

## 2023-07-26 ENCOUNTER — Encounter: Payer: Self-pay | Admitting: Family Medicine

## 2023-07-26 ENCOUNTER — Ambulatory Visit: Payer: Medicare HMO | Admitting: Family Medicine

## 2023-07-26 VITALS — BP 137/77 | HR 88 | Temp 98.7°F | Resp 16 | Wt 268.0 lb

## 2023-07-26 DIAGNOSIS — M1731 Unilateral post-traumatic osteoarthritis, right knee: Secondary | ICD-10-CM

## 2023-07-26 DIAGNOSIS — R300 Dysuria: Secondary | ICD-10-CM

## 2023-07-26 DIAGNOSIS — N3001 Acute cystitis with hematuria: Secondary | ICD-10-CM

## 2023-07-26 MED ORDER — CIPROFLOXACIN HCL 500 MG PO TABS: 500.0000 mg | ORAL_TABLET | Freq: Two times a day (BID) | ORAL | 0 refills | Status: AC

## 2023-07-26 NOTE — Progress Notes (Unsigned)
BP 137/77 (BP Location: Left Arm, Patient Position: Sitting, Cuff Size: Large)   Pulse 88   Temp 98.7 F (37.1 C) (Oral)   Resp 16   Wt 268 lb (121.6 kg)   SpO2 98%   BMI 35.37 kg/m    Subjective:    Patient ID: Eugene Henderson, male    DOB: February 25, 1951, 73 y.o.   MRN: [id number]  HPI: Eugene Henderson is a 73 y.o. male  Chief Complaint  Patient presents with  . Urinary Tract Infection    Started 3 months ago and feels like he has to force it   URINARY SYMPTOMS Duration: 4-5 days Dysuria: yes Urinary frequency: yes Urgency: yes Small volume voids: yes Symptom severity: moderate Urinary incontinence: no Foul odor: no Hematuria: yes Abdominal pain: no Back pain: yes Suprapubic pain/pressure: no Flank pain: no Fever:  no Vomiting: no Relief with cranberry juice: no Relief with pyridium: no Status: worse Previous urinary tract infection: yes Recurrent urinary tract infection: no History of sexually transmitted disease: no Penile discharge: no Treatments attempted: increasing fluids   Relevant past medical, surgical, family and social history reviewed and updated as indicated. Interim medical history since our last visit reviewed. Allergies and medications reviewed and updated.  Review of Systems  Constitutional: Negative.   Respiratory: Negative.    Cardiovascular: Negative.   Gastrointestinal: Negative.   Genitourinary:  Positive for dysuria, frequency and urgency. Negative for decreased urine volume, difficulty urinating, enuresis, flank pain, genital sores, hematuria, penile discharge, penile pain, penile swelling, scrotal swelling and testicular pain.  Musculoskeletal:  Positive for arthralgias. Negative for back pain, gait problem, joint swelling, myalgias, neck pain and neck stiffness.  Psychiatric/Behavioral: Negative.      Per HPI unless specifically indicated above     Objective:    BP 137/77 (BP Location: Left Arm, Patient Position: Sitting, Cuff  Size: Large)   Pulse 88   Temp 98.7 F (37.1 C) (Oral)   Resp 16   Wt 268 lb (121.6 kg)   SpO2 98%   BMI 35.37 kg/m   Wt Readings from Last 3 Encounters:  07/26/23 268 lb (121.6 kg)  07/13/23 270 lb (122.5 kg)  04/22/23 277 lb 4 oz (125.8 kg)    Physical Exam Vitals and nursing note reviewed.  Constitutional:      General: He is not in acute distress.    Appearance: Normal appearance. He is obese. He is not ill-appearing, toxic-appearing or diaphoretic.  HENT:     Head: Normocephalic and atraumatic.     Right Ear: External ear normal.     Left Ear: External ear normal.     Nose: Nose normal.     Mouth/Throat:     Mouth: Mucous membranes are moist.     Pharynx: Oropharynx is clear.  Eyes:     General: No scleral icterus.       Right eye: No discharge.        Left eye: No discharge.     Extraocular Movements: Extraocular movements intact.     Conjunctiva/sclera: Conjunctivae normal.     Pupils: Pupils are equal, round, and reactive to light.  Cardiovascular:     Rate and Rhythm: Normal rate and regular rhythm.     Pulses: Normal pulses.     Heart sounds: Normal heart sounds. No murmur heard.    No friction rub. No gallop.  Pulmonary:     Effort: Pulmonary effort is normal. No respiratory distress.     Breath sounds:  Normal breath sounds. No stridor. No wheezing, rhonchi or rales.  Chest:     Chest wall: No tenderness.  Musculoskeletal:        General: Normal range of motion.     Cervical back: Normal range of motion and neck supple.  Skin:    General: Skin is warm and dry.     Capillary Refill: Capillary refill takes less than 2 seconds.     Coloration: Skin is not jaundiced or pale.     Findings: No bruising, erythema, lesion or rash.  Neurological:     General: No focal deficit present.     Mental Status: He is alert and oriented to person, place, and time. Mental status is at baseline.  Psychiatric:        Mood and Affect: Mood normal.        Behavior:  Behavior normal.        Thought Content: Thought content normal.        Judgment: Judgment normal.    Results for orders placed or performed in visit on 07/13/23  Bayer DCA Hb A1c Waived   Collection Time: 07/13/23  9:58 AM  Result Value Ref Range   HB A1C (BAYER DCA - WAIVED) 6.6 (H) 4.8 - 5.6 %  Microalbumin, Urine Waived   Collection Time: 07/13/23  9:58 AM  Result Value Ref Range   Microalb, Ur Waived 150 (H) 0 - 19 mg/L   Creatinine, Urine Waived 200 10 - 300 mg/dL   Microalb/Creat Ratio 30-300 (H) <30 mg/g  CBC with Differential/Platelet   Collection Time: 07/13/23 10:01 AM  Result Value Ref Range   WBC 3.9 3.4 - 10.8 x10E3/uL   RBC 4.64 4.14 - 5.80 x10E6/uL   Hemoglobin 13.4 13.0 - 17.7 g/dL   Hematocrit 16.1 09.6 - 51.0 %   MCV 87 79 - 97 fL   MCH 28.9 26.6 - 33.0 pg   MCHC 33.3 31.5 - 35.7 g/dL   RDW 04.5 40.9 - 81.1 %   Platelets 226 150 - 450 x10E3/uL   Neutrophils 41 Not Estab. %   Lymphs 42 Not Estab. %   Monocytes 12 Not Estab. %   Eos 4 Not Estab. %   Basos 1 Not Estab. %   Neutrophils Absolute 1.6 1.4 - 7.0 x10E3/uL   Lymphocytes Absolute 1.6 0.7 - 3.1 x10E3/uL   Monocytes Absolute 0.5 0.1 - 0.9 x10E3/uL   EOS (ABSOLUTE) 0.2 0.0 - 0.4 x10E3/uL   Basophils Absolute 0.0 0.0 - 0.2 x10E3/uL   Immature Granulocytes 0 Not Estab. %   Immature Grans (Abs) 0.0 0.0 - 0.1 x10E3/uL  Comprehensive metabolic panel   Collection Time: 07/13/23 10:01 AM  Result Value Ref Range   Glucose 103 (H) 70 - 99 mg/dL   BUN 14 8 - 27 mg/dL   Creatinine, Ser 9.14 0.76 - 1.27 mg/dL   eGFR 85 >78 GN/FAO/1.30   BUN/Creatinine Ratio 15 10 - 24   Sodium 140 134 - 144 mmol/L   Potassium 4.0 3.5 - 5.2 mmol/L   Chloride 103 96 - 106 mmol/L   CO2 21 20 - 29 mmol/L   Calcium 8.8 8.6 - 10.2 mg/dL   Total Protein 7.0 6.0 - 8.5 g/dL   Albumin 4.2 3.8 - 4.8 g/dL   Globulin, Total 2.8 1.5 - 4.5 g/dL   Bilirubin Total 0.5 0.0 - 1.2 mg/dL   Alkaline Phosphatase 92 44 - 121 IU/L   AST 12  0 - 40 IU/L  ALT 9 0 - 44 IU/L  Lipid Panel w/o Chol/HDL Ratio   Collection Time: 07/13/23 10:01 AM  Result Value Ref Range   Cholesterol, Total 88 (L) 100 - 199 mg/dL   Triglycerides 90 0 - 149 mg/dL   HDL 47 >16 mg/dL   VLDL Cholesterol Cal 18 5 - 40 mg/dL   LDL Chol Calc (NIH) 23 0 - 99 mg/dL  TSH   Collection Time: 07/13/23 10:01 AM  Result Value Ref Range   TSH 4.400 0.450 - 4.500 uIU/mL  HCV RNA quant   Collection Time: 07/13/23 10:01 AM  Result Value Ref Range   Hepatitis C Quantitation HCV Not Detected IU/mL   Test Information Comment       Assessment & Plan:   Problem List Items Addressed This Visit   None Visit Diagnoses       Dysuria    -  Primary   Relevant Orders   Urinalysis, Routine w reflex microscopic        Follow up plan: No follow-ups on file.

## 2023-07-27 ENCOUNTER — Ambulatory Visit: Payer: Medicare HMO | Admitting: Family Medicine

## 2023-07-27 ENCOUNTER — Encounter: Payer: Self-pay | Admitting: Family Medicine

## 2023-07-27 DIAGNOSIS — N3001 Acute cystitis with hematuria: Secondary | ICD-10-CM | POA: Diagnosis not present

## 2023-07-27 LAB — URINALYSIS, ROUTINE W REFLEX MICROSCOPIC
Bilirubin, UA: NEGATIVE
Ketones, UA: NEGATIVE
Nitrite, UA: POSITIVE — AB
Specific Gravity, UA: 1.01 (ref 1.005–1.030)
Urobilinogen, Ur: 0.2 mg/dL (ref 0.2–1.0)
pH, UA: 6 (ref 5.0–7.5)

## 2023-07-27 LAB — MICROSCOPIC EXAMINATION: WBC, UA: >30 /[HPF] — AB (ref 0–5)

## 2023-07-27 NOTE — Assessment & Plan Note (Signed)
Has not seen his orthopedist in about 2 years- referral sent over.

## 2023-07-30 LAB — URINE CULTURE

## 2023-07-31 ENCOUNTER — Encounter: Payer: Self-pay | Admitting: Family Medicine

## 2023-08-01 DIAGNOSIS — R197 Diarrhea, unspecified: Secondary | ICD-10-CM | POA: Diagnosis not present

## 2023-08-04 ENCOUNTER — Other Ambulatory Visit: Payer: Self-pay | Admitting: Family Medicine

## 2023-08-05 ENCOUNTER — Ambulatory Visit: Payer: Medicare HMO

## 2023-08-05 DIAGNOSIS — I428 Other cardiomyopathies: Secondary | ICD-10-CM

## 2023-08-05 LAB — STOOL CULTURE: E coli, Shiga toxin Assay: NEGATIVE

## 2023-08-05 LAB — FECAL LEUKOCYTES

## 2023-08-05 LAB — OVA AND PARASITE EXAMINATION

## 2023-08-05 LAB — CLOSTRIDIUM DIFFICILE EIA: C difficile Toxins A+B, EIA: NEGATIVE

## 2023-08-09 ENCOUNTER — Encounter: Payer: Self-pay | Admitting: Cardiology

## 2023-08-09 LAB — CUP PACEART REMOTE DEVICE CHECK
Battery Voltage: 85
Date Time Interrogation Session: 20250304093802
Implantable Lead Connection Status: 753985
Implantable Lead Implant Date: 20240529
Implantable Lead Location: 753860
Implantable Lead Model: 436909
Implantable Lead Serial Number: 81545001
Implantable Pulse Generator Implant Date: 20240529
Pulse Gen Model: 429525
Pulse Gen Serial Number: 84937644

## 2023-08-13 ENCOUNTER — Other Ambulatory Visit: Payer: Self-pay | Admitting: Family Medicine

## 2023-08-14 ENCOUNTER — Other Ambulatory Visit: Payer: Self-pay | Admitting: Family Medicine

## 2023-08-15 ENCOUNTER — Other Ambulatory Visit (HOSPITAL_COMMUNITY): Payer: Self-pay

## 2023-08-15 MED ORDER — POTASSIUM CHLORIDE CRYS ER 20 MEQ PO TBCR
20.0000 meq | EXTENDED_RELEASE_TABLET | Freq: Two times a day (BID) | ORAL | 1 refills | Status: DC
  Filled 2023-08-15: qty 90, 45d supply, fill #0

## 2023-08-16 ENCOUNTER — Other Ambulatory Visit (HOSPITAL_COMMUNITY): Payer: Self-pay

## 2023-08-16 ENCOUNTER — Other Ambulatory Visit: Payer: Self-pay

## 2023-08-16 NOTE — Progress Notes (Unsigned)
  Electrophysiology Office Follow up Visit Note:    Date:  08/17/2023   ID:  Eugene Henderson, DOB Jan 02, 1951, MRN [id number]  PCP:  Dorcas Carrow, DO  [facility] Cardiologist:  Lorine Bears, MD  [facility] Electrophysiologist:  Lanier Prude, MD    Interval History:     Eugene Henderson is a 73 y.o. male who presents for a follow up visit.   He has an ICD that was implanted Nov 03, 2022.  He has done well since implant.  He has atrial fibrillation that was detected by his device.  He is on Eliquis for stroke prophylaxis. Reports intermittent chest pain that is not positional. Is substernal, non radiating. No other symptoms.        Past medical, surgical, social and family history were reviewed.  ROS:   Please see the history of present illness.    All other systems reviewed and are negative.  EKGs/Labs/Other Studies Reviewed:    The following studies were reviewed today:  August 17, 2023 in clinic device interrogation personally reviewed Reprogrammed to VVI 40 Lead parameters stable 10j0% battery remaining          Physical Exam:    VS:  BP 108/60   Pulse 72   Ht 6\' 1"  (1.854 m)   Wt 285 lb (129.3 kg)   SpO2 98%   BMI 37.60 kg/m     Wt Readings from Last 3 Encounters:  08/17/23 285 lb (129.3 kg)  07/26/23 268 lb (121.6 kg)  07/13/23 270 lb (122.5 kg)     GEN: no distress CARD: RRR, No MRG.  ICD pocket well-healed RESP: No IWOB. CTAB.      ASSESSMENT:    1. Chest pain of uncertain etiology    PLAN:    In order of problems listed above:  #ICD in situ #Chronic systolic heart failure NYHA class II.  Continue spironolactone, Entresto, metoprolol, Jardiance, Lasix, digoxin  #permanent atrial fibrillation Continue Eliquis for stroke prophylaxis Disable alerts on his device Reprogrammed to VVI  #Chest pain Plan for PET Stress to further evaluate.  Signed, Steffanie Dunn, MD, [facility], [facility] 08/17/2023 12:21 PM     Electrophysiology [facility]

## 2023-08-17 ENCOUNTER — Other Ambulatory Visit: Payer: Self-pay | Admitting: Family Medicine

## 2023-08-17 ENCOUNTER — Ambulatory Visit: Payer: Medicare HMO | Attending: Cardiology | Admitting: Cardiology

## 2023-08-17 ENCOUNTER — Encounter: Payer: Self-pay | Admitting: Cardiology

## 2023-08-17 VITALS — BP 108/60 | HR 72 | Ht 73.0 in | Wt 285.0 lb

## 2023-08-17 DIAGNOSIS — I469 Cardiac arrest, cause unspecified: Secondary | ICD-10-CM

## 2023-08-17 DIAGNOSIS — I5022 Chronic systolic (congestive) heart failure: Secondary | ICD-10-CM | POA: Diagnosis not present

## 2023-08-17 DIAGNOSIS — R079 Chest pain, unspecified: Secondary | ICD-10-CM | POA: Diagnosis not present

## 2023-08-17 LAB — CUP PACEART INCLINIC DEVICE CHECK
Date Time Interrogation Session: 20250312141254
Implantable Lead Connection Status: 753985
Implantable Lead Implant Date: 20240529
Implantable Lead Location: 753860
Implantable Lead Model: 436909
Implantable Lead Serial Number: 81545001
Implantable Pulse Generator Implant Date: 20240529
Pulse Gen Model: 429525
Pulse Gen Serial Number: 84937644

## 2023-08-17 NOTE — Patient Instructions (Addendum)
 Medication Instructions:  The current medical regimen is effective;  continue present plan and medications.  *If you need a refill on your cardiac medications before your next appointment, please call your pharmacy*   Testing/Procedures: CARDIAC PET- Your physician has requested that you have a Cardiac Pet Stress Test.   This testing is completed at [facility] ([address] [place], [place]). Please arrive 30 minutes prior to your scheduled time.  The schedulers will call you to get this scheduled. Please follow further testing instructions below.   Follow-Up: At [facility], you and your health needs are our priority.  As part of our continuing mission to provide you with exceptional heart care, we have created designated Provider Care Teams.  These Care Teams include your primary Cardiologist (physician) and Advanced Practice Providers (APPs -  Physician Assistants and Nurse Practitioners) who all work together to provide you with the care you need, when you need it.  We recommend signing up for the patient portal called "MyChart".  Sign up information is provided on this After Visit Summary.  MyChart is used to connect with patients for Virtual Visits (Telemedicine).  Patients are able to view lab/test results, encounter notes, upcoming appointments, etc.  Non-urgent messages can be sent to your provider as well.   To learn more about what you can do with MyChart, go to [email or web address].    Your next appointment:   12 month(s)  Provider:   Steffanie Dunn, MD    Other Instructions    Please report to Radiology at [facility] Main Entrance, medical mall, 30 mins prior to your test.  [address]  [place], [place]  How to Prepare for Your Cardiac PET/CT Stress Test:  Nothing to eat or drink, except water, 3 hours prior to arrival time.  NO caffeine/decaffeinated products, or chocolate 12 hours prior to  arrival. (Please note decaffeinated beverages (teas/coffees) still contain caffeine).  If you have caffeine within 12 hours prior, the test will need to be rescheduled.  Medication instructions: Do not take erectile dysfunction medications for 72 hours prior to test (sildenafil, tadalafil) Do not take nitrates (isosorbide mononitrate, Ranexa) the day before or day of test Do not take tamsulosin the day before or morning of test Hold theophylline containing medications for 12 hours. Hold Dipyridamole 48 hours prior to the test.  Diabetic Preparation: If able to eat breakfast prior to 3 hour fasting, you may take all medications, including your insulin. Do not worry if you miss your breakfast dose of insulin - start at your next meal. If you do not eat prior to 3 hour fast-Hold all diabetes (oral and insulin) medications. Patients who wear a continuous glucose monitor MUST remove the device prior to scanning.  You may take your remaining medications with water.  NO perfume, cologne or lotion on chest or abdomen area. FEMALES - Please avoid wearing dresses to this appointment.  Total time is 1 to 2 hours; you may want to bring reading material for the waiting time.  IF YOU THINK YOU MAY BE PREGNANT, OR ARE NURSING PLEASE INFORM THE TECHNOLOGIST.  In preparation for your appointment, medication and supplies will be purchased.  Appointment availability is limited, so if you need to cancel or reschedule, please call the Radiology Department Scheduler at [phone] 24 hours in advance to avoid a cancellation fee of $100.00  What to Expect When you Arrive:  Once you arrive and check in for your appointment, you  will be taken to a preparation room within the Radiology Department.  A technologist or Nurse will obtain your medical history, verify that you are correctly prepped for the exam, and explain the procedure.  Afterwards, an IV will be started in your arm and electrodes will be placed on  your skin for EKG monitoring during the stress portion of the exam. Then you will be escorted to the PET/CT scanner.  There, staff will get you positioned on the scanner and obtain a blood pressure and EKG.  During the exam, you will continue to be connected to the EKG and blood pressure machines.  A small, safe amount of a radioactive tracer will be injected in your IV to obtain a series of pictures of your heart along with an injection of a stress agent.    After your Exam:  It is recommended that you eat a meal and drink a caffeinated beverage to counter act any effects of the stress agent.  Drink plenty of fluids for the remainder of the day and urinate frequently for the first couple of hours after the exam.  Your doctor will inform you of your test results within 7-10 business days.  For more information and frequently asked questions, please visit our website: [email or web address]  For questions about your test or how to prepare for your test, please call: Cardiac Imaging Nurse Navigators Office: [phone]

## 2023-08-18 ENCOUNTER — Encounter: Payer: Self-pay | Admitting: Cardiology

## 2023-08-23 ENCOUNTER — Other Ambulatory Visit: Payer: Self-pay | Admitting: Family Medicine

## 2023-08-23 ENCOUNTER — Telehealth: Payer: Self-pay

## 2023-08-23 DIAGNOSIS — H16009 Unspecified corneal ulcer, unspecified eye: Secondary | ICD-10-CM

## 2023-08-23 DIAGNOSIS — E1159 Type 2 diabetes mellitus with other circulatory complications: Secondary | ICD-10-CM

## 2023-08-23 NOTE — Telephone Encounter (Unsigned)
 Copied from CRM [id number]. Topic: Clinical - Prescription Issue >> Aug 22, 2023  3:06 PM Geroge Baseman wrote: Reason for CRM: prednisoLONE acetate (PRED FORTE) 1 % ophthalmic suspension, patient waiting on a refill of this medication. Has not heard anything and the pharmacy does not have it. Please call to advise if there is an issue.

## 2023-08-23 NOTE — Telephone Encounter (Signed)
 Copied from CRM [id number]. Topic: Clinical - Prescription Issue >> Aug 22, 2023  3:06 PM Geroge Baseman wrote: Reason for CRM: prednisoLONE acetate (PRED FORTE) 1 % ophthalmic suspension, patient waiting on a refill of this medication. Has not heard anything and the pharmacy does not have it. Please call to advise if there is an issue.

## 2023-08-23 NOTE — Telephone Encounter (Signed)
 Called and notified patient's daughter of Dr. Henriette Combs message. Patient's daughter states that the patient does not currently have an eye doctor and would like to be referred to one if possible. States the patient has not seen an eye doctor in a couple of years, last saw [facility] and was told that they wouldn't see him anymore.

## 2023-08-23 NOTE — Telephone Encounter (Signed)
 Can this be refilled for the patient?

## 2023-08-23 NOTE — Telephone Encounter (Signed)
 Referral back to [facility] placed today

## 2023-08-23 NOTE — Telephone Encounter (Signed)
 This needs to come from his eye doctor. We do not write this

## 2023-08-29 ENCOUNTER — Telehealth: Payer: Self-pay

## 2023-08-29 NOTE — Telephone Encounter (Signed)
 Biotronik alert received and AF w/ RVR (unknown duration). Patient had OV w/ Dr. Lalla Brothers 08/17/23, no med changes were made.   Attempted to contact patient. No answer, left message to call back.

## 2023-08-30 NOTE — Telephone Encounter (Signed)
Attempted to contact patient. No answer, left message to call back

## 2023-09-02 NOTE — Progress Notes (Signed)
 Remote ICD transmission.

## 2023-09-02 NOTE — Addendum Note (Signed)
 Addended by: Elease Etienne A on: 09/02/2023 12:57 PM   Modules accepted: Orders

## 2023-09-02 NOTE — Telephone Encounter (Signed)
 Reprogrammed Ventricular monitoring episode with long duration to >10 minutes.  Previously programmed at 5 minutes.  Continue to monitor.

## 2023-09-21 ENCOUNTER — Telehealth (HOSPITAL_COMMUNITY): Payer: Self-pay | Admitting: Emergency Medicine

## 2023-09-21 ENCOUNTER — Encounter (HOSPITAL_COMMUNITY): Payer: Self-pay

## 2023-09-21 ENCOUNTER — Other Ambulatory Visit: Payer: Self-pay | Admitting: Cardiovascular Disease

## 2023-09-21 DIAGNOSIS — R072 Precordial pain: Secondary | ICD-10-CM

## 2023-09-21 NOTE — Telephone Encounter (Signed)
Attempted to call patient regarding upcoming cardiac PET appointment. Left message on voicemail with name and callback number Aidan Moten RN Navigator Cardiac Imaging [facility] [phone] Office [phone] Cell  

## 2023-09-21 NOTE — Progress Notes (Signed)
 PET consent ordered.   Melodee Spruce T. Rolm Clos, MD, [facility]  [facility]  [address], [address] [place], [place] [address] [phone]  8:41 AM

## 2023-09-22 ENCOUNTER — Ambulatory Visit: Admission: RE | Admit: 2023-09-22 | Source: Ambulatory Visit

## 2023-09-24 ENCOUNTER — Other Ambulatory Visit: Payer: Self-pay | Admitting: Family Medicine

## 2023-09-26 ENCOUNTER — Other Ambulatory Visit: Payer: Self-pay | Admitting: Family Medicine

## 2023-09-26 NOTE — Telephone Encounter (Signed)
 Requested medications are due for refill today.  unsure  Requested medications are on the active medications list.  no  Last refill. 07/26/2023 #14 0 r   Future visit scheduled.   yes  Notes to clinic.  Medication not assigned to a protocol. Please review for refill.    Requested Prescriptions  Pending Prescriptions Disp Refills   ciprofloxacin  (CIPRO ) 500 MG tablet [Pharmacy Med Name: CIPROFLOXACIN  HCL 500 MG TAB] 14 tablet 0    Sig: TAKE 1 TABLET BY MOUTH TWICE A DAY FOR 7 DAYS     Off-Protocol Failed - 09/26/2023 12:26 PM      Failed - Medication not assigned to a protocol, review manually.      Passed - Valid encounter within last 12 months    Recent Outpatient Visits           2 months ago Acute cystitis with hematuria   [place] [facility] [place], [place] P, DO   2 months ago Diarrhea, unspecified type   [facility] Solomon Dupre, DO       Future Appointments             In 3 weeks Lincoln Renshaw, Jerilee Montane, DO [facility], [facility]

## 2023-09-27 NOTE — Telephone Encounter (Signed)
 Change in pharmacy Requested Prescriptions  Pending Prescriptions Disp Refills   KLOR-CON  M20 20 MEQ tablet [Pharmacy Med Name: KLOR-CON  M20 TABLET] 90 tablet 1    Sig: TAKE 1 TABLET BY MOUTH EVERY DAY     Endocrinology:  Minerals - Potassium Supplementation Passed - 09/27/2023 12:12 PM      Passed - K in normal range and within 360 days    Potassium  Date Value Ref Range Status  07/13/2023 4.0 3.5 - 5.2 mmol/L Final         Passed - Cr in normal range and within 360 days    Creatinine, Ser  Date Value Ref Range Status  07/13/2023 0.95 0.76 - 1.27 mg/dL Final         Passed - Valid encounter within last 12 months    Recent Outpatient Visits           2 months ago Acute cystitis with hematuria   [facility] [place], Megan P, DO   2 months ago Diarrhea, unspecified type   [facility] Solomon Dupre, DO       Future Appointments             In 3 weeks Lincoln Renshaw, Jerilee Montane, DO [facility], [facility]

## 2023-10-01 ENCOUNTER — Other Ambulatory Visit: Payer: Self-pay | Admitting: Cardiology

## 2023-10-19 ENCOUNTER — Encounter: Payer: Medicare HMO | Admitting: Family Medicine

## 2023-10-19 ENCOUNTER — Other Ambulatory Visit: Payer: Self-pay | Admitting: Student in an Organized Health Care Education/Training Program

## 2023-10-19 DIAGNOSIS — J432 Centrilobular emphysema: Secondary | ICD-10-CM

## 2023-10-19 NOTE — Telephone Encounter (Unsigned)
 Copied from CRM [id number]. Topic: General - Billing Inquiry >> Oct 19, 2023 10:05 AM Baldomero Bone wrote: Reason for CRM: Patient has Medicaid and Medicare but he received a bill per daughter, Melody. Callback number for Melody is [phone]

## 2023-10-24 NOTE — Telephone Encounter (Signed)
 Appt scheduled

## 2023-10-25 ENCOUNTER — Encounter (INDEPENDENT_AMBULATORY_CARE_PROVIDER_SITE_OTHER): Payer: Self-pay

## 2023-11-01 ENCOUNTER — Encounter (HOSPITAL_COMMUNITY): Payer: Self-pay

## 2023-11-02 ENCOUNTER — Encounter: Payer: Self-pay | Admitting: Family Medicine

## 2023-11-02 ENCOUNTER — Other Ambulatory Visit (INDEPENDENT_AMBULATORY_CARE_PROVIDER_SITE_OTHER): Payer: Self-pay | Admitting: Vascular Surgery

## 2023-11-02 ENCOUNTER — Telehealth (HOSPITAL_COMMUNITY): Payer: Self-pay | Admitting: *Deleted

## 2023-11-02 DIAGNOSIS — I7143 Infrarenal abdominal aortic aneurysm, without rupture: Secondary | ICD-10-CM

## 2023-11-02 NOTE — Telephone Encounter (Signed)
 Attempted to call patient regarding upcoming cardiac PET appointment. Left message on voicemail with name and callback number  Larey Brick RN Navigator Cardiac Imaging [facility] [phone] Office [phone] Cell

## 2023-11-03 ENCOUNTER — Other Ambulatory Visit: Payer: Self-pay

## 2023-11-03 ENCOUNTER — Ambulatory Visit
Admission: RE | Admit: 2023-11-03 | Discharge: 2023-11-03 | Disposition: A | Discharge status: Home / Self Care | Source: Ambulatory Visit | Attending: Cardiology | Admitting: Cardiology

## 2023-11-03 DIAGNOSIS — R079 Chest pain, unspecified: Secondary | ICD-10-CM

## 2023-11-03 DIAGNOSIS — I4819 Other persistent atrial fibrillation: Secondary | ICD-10-CM

## 2023-11-03 MED ORDER — METOPROLOL SUCCINATE ER 50 MG PO TB24
150.0000 mg | ORAL_TABLET | Freq: Every day | ORAL | 3 refills | Status: DC
Start: 2023-11-03 — End: 2024-01-24

## 2023-11-03 MED ORDER — APIXABAN 5 MG PO TABS
5.0000 mg | ORAL_TABLET | Freq: Two times a day (BID) | ORAL | 1 refills | Status: DC
Start: 2023-11-03 — End: 2023-12-14

## 2023-11-03 MED ORDER — FUROSEMIDE 40 MG PO TABS: 40.0000 mg | ORAL_TABLET | Freq: Every day | ORAL | 3 refills | Status: AC

## 2023-11-03 MED ORDER — ENTRESTO 97-103 MG PO TABS
1.0000 | ORAL_TABLET | Freq: Two times a day (BID) | ORAL | 3 refills | Status: DC
Start: 2023-11-03 — End: 2023-12-08

## 2023-11-03 MED ORDER — REGADENOSON 0.4 MG/5ML IV SOLN
INTRAVENOUS | Status: AC
  Filled 2023-11-03: qty 5

## 2023-11-03 MED ORDER — DIGOXIN 125 MCG PO TABS: ORAL_TABLET | ORAL | 3 refills | Status: DC

## 2023-11-03 NOTE — Telephone Encounter (Signed)
 Prescription refill request for Eliquis  received. Indication: Afib  Last office visit:08/17/23 Eugene Henderson)  Scr: 0.95 (07/13/23)  Age: 73 Weight: 129.3kg  Appropriate dose. Refill sent.

## 2023-11-04 ENCOUNTER — Ambulatory Visit (INDEPENDENT_AMBULATORY_CARE_PROVIDER_SITE_OTHER): Payer: Medicare HMO

## 2023-11-04 ENCOUNTER — Other Ambulatory Visit: Payer: Self-pay | Admitting: Cardiology

## 2023-11-04 DIAGNOSIS — I428 Other cardiomyopathies: Secondary | ICD-10-CM | POA: Diagnosis not present

## 2023-11-04 LAB — CUP PACEART REMOTE DEVICE CHECK
Battery Voltage: 75
Date Time Interrogation Session: 20250530131841
Implantable Lead Connection Status: 753985
Implantable Lead Implant Date: 20240529
Implantable Lead Location: 753860
Implantable Lead Model: 436909
Implantable Lead Serial Number: 81545001
Implantable Pulse Generator Implant Date: 20240529
Pulse Gen Model: 429525
Pulse Gen Serial Number: 84937644

## 2023-11-05 ENCOUNTER — Other Ambulatory Visit: Payer: Self-pay | Admitting: Family Medicine

## 2023-11-05 NOTE — Progress Notes (Unsigned)
 MRN : [id number]  Eugene Henderson is a 73 y.o. ([birth year]) male who presents with chief complaint of check circulation.  History of Present Illness:   The patient returns to the office for surveillance of an abdominal aortic aneurysm status post stent graft placement on 06/17/2022.    Patient denies abdominal pain or back pain, no other abdominal complaints. No groin related complaints. No symptoms consistent with distal embolization No changes in claudication distance or new rest pain symptoms.    There have been no interval changes in his overall healthcare since his last visit.    Patient denies amaurosis fugax or TIA symptoms.  The patient denies recent episodes of angina or shortness of breath.    CT angiogram of the chest dated June 14, 2022 demonstrates ascending thoracic aortic aneurysm measuring 5.2 cm in diameter, the descending thoracic aorta measures 3.5 cm and the visceral segment of the aorta at the level of the celiac axis measures 4.3 cm.   Duplex ultrasound of the abdominal aorta demonstrates a aneurysm sac measuring 4.5 cm.  Stent graft is patent no evidence of endoleak.  The aneurysm sac is slightly smaller than the previous study.     No outpatient medications have been marked as taking for the 11/07/23 encounter (Appointment) with Prescilla Brod, Ninette Basque, MD.    Past Medical History:  Diagnosis Date   Arthritis    Ascending aortic aneurysm (HCC)    a. 09/2017 Stable TAA - 5.1cm.   Asthma    Chronic systolic CHF (congestive heart failure) (HCC)    a. EF 25-30% by echo in 07/2016 with cath showing no significant CAD b. 01/2017: EF 30-35% with diffuse HK and moderate MR; c. 07/2017 Echo: EF 30-35%, diff hK. Mild MR, mildly dil LA. PASP .   Diverticulitis    Diverticulitis of large intestine with perforation without abscess or bleeding 05/13/2017   Hypertension    Hyperthyroidism    NICM  (nonischemic cardiomyopathy) (HCC)    Noncompliance    Persistent atrial fibrillation (HCC)    a. CHA2DS2VASc = 3-->Eliquis  (? compliance).    Past Surgical History:  Procedure Laterality Date   CARDIOVERSION N/A 06/23/2022   Procedure: CARDIOVERSION;  Surgeon: Alwin Baars, DO;  Location: [facility] ORS;  Service: Cardiovascular;  Laterality: N/A;   CARDIOVERSION N/A 06/25/2022   Procedure: CARDIOVERSION;  Surgeon: Alwin Baars, DO;  Location: [facility] ORS;  Service: Cardiovascular;  Laterality: N/A;   CARDIOVERSION N/A 09/29/2022   Procedure: CARDIOVERSION;  Surgeon: Darlis Eisenmenger, MD;  Location: [facility] INVASIVE CV LAB;  Service: Cardiovascular;  Laterality: N/A;   COLONOSCOPY N/A 08/27/2019   Procedure: COLONOSCOPY;  Surgeon: Toledo, Alphonsus Jeans, MD;  Location: [facility] ENDOSCOPY;  Service: Gastroenterology;  Laterality: N/A;   ENDOVASCULAR REPAIR/STENT GRAFT N/A 06/17/2022   Procedure: ENDOVASCULAR REPAIR/STENT GRAFT;  Surgeon: Celso College, MD;  Location: [facility] INVASIVE CV LAB;  Service: Cardiovascular;  Laterality: N/A;   ICD IMPLANT N/A 11/03/2022   Procedure: ICD IMPLANT;  Surgeon: Boyce Byes, MD;  Location: [facility] INVASIVE CV LAB;  Service: Cardiovascular;  Laterality: N/A;   JOINT REPLACEMENT Right  RIGHT HEART CATH Right 09/21/2022   Procedure: RIGHT HEART CATH;  Surgeon: Darlis Eisenmenger, MD;  Location: [facility] INVASIVE CV LAB;  Service: Cardiovascular;  Laterality: Right;   RIGHT/LEFT HEART CATH AND CORONARY ANGIOGRAPHY N/A 08/02/2016   Procedure: Right/Left Heart Cath and Coronary Angiography;  Surgeon: Wenona Hamilton, MD;  Location: [facility] INVASIVE CV LAB;  Service: Cardiovascular;  Laterality: N/A;   TEE WITHOUT CARDIOVERSION N/A 06/23/2022   Procedure: TRANSESOPHAGEAL ECHOCARDIOGRAM;  Surgeon: Alwin Baars, DO;  Location: [facility] ORS;  Service: Cardiovascular;  Laterality: N/A;   TESTICLE SURGERY     Patient states that he had to have the tube fixed.    Social History Social  History   Tobacco Use   Smoking status: Some Days    Current packs/day: 0.00    Types: Pipe, Cigarettes    Last attempt to quit: 12/27/2022    Years since quitting: 0.8   Smokeless tobacco: Never  Vaping Use   Vaping status: Never Used  Substance Use Topics   Alcohol use: Yes    Alcohol/week: 2.0 standard drinks of alcohol    Types: 2 Cans of beer per week    Comment: 2 40oz beers/week   Drug use: No    Family History Family History  Problem Relation Age of Onset   Diabetes Mother    Diabetes Father    Heart disease Brother    Heart attack Brother    Diabetes Brother    Kidney failure Brother    Thyroid  disease Neg Hx     No Known Allergies   REVIEW OF SYSTEMS (Negative unless checked)  Constitutional: [] Weight loss  [] Fever  [] Chills Cardiac: [] Chest pain   [] Chest pressure   [] Palpitations   [] Shortness of breath when laying flat   [] Shortness of breath with exertion. Vascular:  [x] Pain in legs with walking   [] Pain in legs at rest  [] History of DVT   [] Phlebitis   [] Swelling in legs   [] Varicose veins   [] Non-healing ulcers Pulmonary:   [] Uses home oxygen   [] Productive cough   [] Hemoptysis   [] Wheeze  [] COPD   [] Asthma Neurologic:  [] Dizziness   [] Seizures   [] History of stroke   [] History of TIA  [] Aphasia   [] Vissual changes   [] Weakness or numbness in arm   [] Weakness or numbness in leg Musculoskeletal:   [] Joint swelling   [] Joint pain   [] Low back pain Hematologic:  [] Easy bruising  [] Easy bleeding   [] Hypercoagulable state   [] Anemic Gastrointestinal:  [] Diarrhea   [] Vomiting  [] Gastroesophageal reflux/heartburn   [] Difficulty swallowing. Genitourinary:  [] Chronic kidney disease   [] Difficult urination  [] Frequent urination   [] Blood in urine Skin:  [] Rashes   [] Ulcers  Psychological:  [] History of anxiety   []  History of major depression.  Physical Examination  There were no vitals filed for this visit. There is no height or weight on file to calculate  BMI. Gen: WD/WN, NAD Head: [place]/AT, No temporalis wasting.  Ear/Nose/Throat: Hearing grossly intact, nares w/o erythema or drainage Eyes: PER, EOMI, sclera nonicteric.  Neck: Supple, no masses.  No bruit or JVD.  Pulmonary:  Good air movement, no audible wheezing, no use of accessory muscles.  Cardiac: RRR, normal S1, S2, no Murmurs. Vascular:  mild trophic changes, no open wounds Vessel Right Left  Radial Palpable Palpable  PT Not Palpable Not Palpable  DP Not Palpable Not Palpable  Gastrointestinal: soft, non-distended. No guarding/no peritoneal signs.  Musculoskeletal: M/S 5/5 throughout.  No visible  deformity.  Neurologic: CN 2-12 intact. Pain and light touch intact in extremities.  Symmetrical.  Speech is fluent. Motor exam as listed above. Psychiatric: Judgment intact, Mood & affect appropriate for pt's clinical situation. Dermatologic: No rashes or ulcers noted.  No changes consistent with cellulitis.   CBC Lab Results  Component Value Date   WBC 3.9 07/13/2023   HGB 13.4 07/13/2023   HCT 40.3 07/13/2023   MCV 87 07/13/2023   PLT 226 07/13/2023    BMET    Component Value Date/Time   NA 140 07/13/2023 1001   K 4.0 07/13/2023 1001   CL 103 07/13/2023 1001   CO2 21 07/13/2023 1001   GLUCOSE 103 (H) 07/13/2023 1001   GLUCOSE 98 04/22/2023 1440   BUN 14 07/13/2023 1001   CREATININE 0.95 07/13/2023 1001   CALCIUM  8.8 07/13/2023 1001   GFRNONAA >60 04/22/2023 1440   GFRAA 96 03/28/2020 1524   CrCl cannot be calculated (Patient's most recent lab result is older than the maximum 21 days allowed.).  COAG Lab Results  Component Value Date   INR 1.2 06/25/2022   INR 1.2 06/25/2022   INR 1.2 06/22/2022    Radiology CUP PACEART REMOTE DEVICE CHECK Result Date: 11/04/2023 ICD Scheduled remote reviewed. Normal device function.  Presenting rhythm: AS/VP Next remote 91 days. LA, CVRS    Assessment/Plan 1. Abdominal aortic aneurysm (AAA) without rupture, unspecified  part (HCC) (Primary) Recommend:  Patient is status post successful endovascular repair of the AAA.   No further intervention is required at this time.   No endoleak is detected and the aneurysm sac is stable.  The patient will continue antiplatelet therapy as prescribed as well as aggressive management of hyperlipidemia. Exercise is encouraged.   However, endografts require continued surveillance with ultrasound or CT scan. This is mandatory to detect any changes that allow repressurization of the aneurysm sac.  The patient is informed that this would be asymptomatic.  The patient is reminded that lifelong routine surveillance is a necessity with an endograft. Patient will continue to follow-up at the specified interval with ultrasound of the aorta. - VAS US  AORTA/IVC/ILIACS; Future  2. Thoracoabdominal aortic aneurysm (TAAA) without rupture, unspecified part (HCC) Recommend:  No surgery or intervention is indicated at this time.  CT angiogram of the chest dated June 14, 2022 demonstrates ascending thoracic aortic aneurysm measuring 5.2 cm in diameter, the descending thoracic aorta measures 3.5 cm and the visceral segment of the aorta at the level of the celiac axis measures 4.3 cm.  The patient has an asymptomatic thoracic aortic aneurysm that is less than 6.0 cm in maximal diameter.  I have discussed the natural history of thoracic aortic aneurysm and the small risk of rupture for aneurysm less than 6.5 cm in size.  However, as these small aneurysms tend to enlarge over time, continued surveillance with CT scan is mandatory.   I have also discussed optimizing medical management with hypertension and lipid control and the negative effect that any tobacco products have on aneurysmal disease.  The patient is also encouraged to exercise a minimum of 30 minutes 4 times a week.   Should the patient develop new onset chest or back pain or signs of peripheral embolization they are instructed to  seek medical attention immediately and to alert the physician providing care that they have an aneurysm in the chest.   The patient voices their understanding.  The patient will return as ordered with a CT scan of the chest.  He will require a CT scan of the chest with the next follow-up appointment in 2026.  3. Persistent atrial fibrillation (HCC) Continue antiarrhythmia medications as already ordered, these medications have been reviewed and there are no changes at this time.  Continue anticoagulation as ordered by Cardiology Service  4. Essential hypertension Continue antihypertensive medications as already ordered, these medications have been reviewed and there are no changes at this time.  5. Diabetes mellitus with cardiac complication (HCC) Continue hypoglycemic medications as already ordered, these medications have been reviewed and there are no changes at this time.  Hgb A1C to be monitored as already arranged by primary service    Devon Fogo, MD  11/05/2023 3:13 PM

## 2023-11-06 ENCOUNTER — Ambulatory Visit: Payer: Self-pay | Admitting: Cardiology

## 2023-11-07 ENCOUNTER — Ambulatory Visit (INDEPENDENT_AMBULATORY_CARE_PROVIDER_SITE_OTHER): Payer: Medicare HMO

## 2023-11-07 ENCOUNTER — Ambulatory Visit (INDEPENDENT_AMBULATORY_CARE_PROVIDER_SITE_OTHER): Payer: Medicare HMO | Admitting: Vascular Surgery

## 2023-11-07 VITALS — BP 141/93 | HR 63 | Resp 16 | Ht 73.0 in | Wt 275.0 lb

## 2023-11-07 DIAGNOSIS — I7143 Infrarenal abdominal aortic aneurysm, without rupture: Secondary | ICD-10-CM

## 2023-11-07 DIAGNOSIS — I714 Abdominal aortic aneurysm, without rupture, unspecified: Secondary | ICD-10-CM

## 2023-11-07 DIAGNOSIS — I4819 Other persistent atrial fibrillation: Secondary | ICD-10-CM | POA: Diagnosis not present

## 2023-11-07 DIAGNOSIS — I7161 Supraceliac aneurysm of the thoracoabdominal aorta, without rupture: Secondary | ICD-10-CM

## 2023-11-07 DIAGNOSIS — I10 Essential (primary) hypertension: Secondary | ICD-10-CM

## 2023-11-07 DIAGNOSIS — I716 Thoracoabdominal aortic aneurysm, without rupture, unspecified: Secondary | ICD-10-CM

## 2023-11-07 DIAGNOSIS — E1159 Type 2 diabetes mellitus with other circulatory complications: Secondary | ICD-10-CM

## 2023-11-07 NOTE — Telephone Encounter (Signed)
 Requested Prescriptions  Pending Prescriptions Disp Refills   pantoprazole  (PROTONIX ) 40 MG tablet [Pharmacy Med Name: PANTOPRAZOLE  SOD DR 40 MG TAB] 180 tablet 0    Sig: TAKE 1 TABLET BY MOUTH TWICE A DAY     Gastroenterology: Proton Pump Inhibitors Passed - 11/07/2023  1:47 PM      Passed - Valid encounter within last 12 months    Recent Outpatient Visits           3 months ago Acute cystitis with hematuria   [facility] Conyers, Megan P, DO   3 months ago Diarrhea, unspecified type   [facility] Solomon Dupre, DO       Future Appointments             In 1 week Lincoln Renshaw, Jerilee Montane, DO [facility]

## 2023-11-08 ENCOUNTER — Encounter (INDEPENDENT_AMBULATORY_CARE_PROVIDER_SITE_OTHER): Payer: Self-pay | Admitting: Vascular Surgery

## 2023-11-09 ENCOUNTER — Encounter: Payer: Self-pay | Admitting: Family Medicine

## 2023-11-09 ENCOUNTER — Ambulatory Visit: Admitting: Family Medicine

## 2023-11-09 VITALS — BP 138/72 | HR 91 | Ht 73.0 in | Wt 287.0 lb

## 2023-11-09 DIAGNOSIS — E538 Deficiency of other specified B group vitamins: Secondary | ICD-10-CM | POA: Diagnosis not present

## 2023-11-09 DIAGNOSIS — G8929 Other chronic pain: Secondary | ICD-10-CM

## 2023-11-09 DIAGNOSIS — Z7984 Long term (current) use of oral hypoglycemic drugs: Secondary | ICD-10-CM

## 2023-11-09 DIAGNOSIS — J432 Centrilobular emphysema: Secondary | ICD-10-CM

## 2023-11-09 DIAGNOSIS — E1159 Type 2 diabetes mellitus with other circulatory complications: Secondary | ICD-10-CM

## 2023-11-09 DIAGNOSIS — M1731 Unilateral post-traumatic osteoarthritis, right knee: Secondary | ICD-10-CM

## 2023-11-09 DIAGNOSIS — R300 Dysuria: Secondary | ICD-10-CM

## 2023-11-09 DIAGNOSIS — N3001 Acute cystitis with hematuria: Secondary | ICD-10-CM

## 2023-11-09 DIAGNOSIS — I10 Essential (primary) hypertension: Secondary | ICD-10-CM

## 2023-11-09 DIAGNOSIS — M545 Low back pain, unspecified: Secondary | ICD-10-CM

## 2023-11-09 DIAGNOSIS — I429 Cardiomyopathy, unspecified: Secondary | ICD-10-CM

## 2023-11-09 LAB — URINALYSIS, ROUTINE W REFLEX MICROSCOPIC
Bilirubin, UA: NEGATIVE
Ketones, UA: NEGATIVE
Nitrite, UA: POSITIVE — AB
Specific Gravity, UA: 1.01 (ref 1.005–1.030)
Urobilinogen, Ur: 0.2 mg/dL (ref 0.2–1.0)
pH, UA: 5.5 (ref 5.0–7.5)

## 2023-11-09 LAB — BAYER DCA HB A1C WAIVED: HB A1C (BAYER DCA - WAIVED): 7.1 % — ABNORMAL HIGH (ref 4.8–5.6)

## 2023-11-09 LAB — MICROSCOPIC EXAMINATION

## 2023-11-09 MED ORDER — ALBUTEROL SULFATE HFA 108 (90 BASE) MCG/ACT IN AERS: 1.0000 | INHALATION_SPRAY | Freq: Four times a day (QID) | RESPIRATORY_TRACT | 6 refills | Status: DC | PRN

## 2023-11-09 MED ORDER — ATORVASTATIN CALCIUM 40 MG PO TABS: 40.0000 mg | ORAL_TABLET | Freq: Every day | ORAL | 1 refills | Status: DC

## 2023-11-09 MED ORDER — METFORMIN HCL 500 MG PO TABS: 500.0000 mg | ORAL_TABLET | Freq: Two times a day (BID) | ORAL | 1 refills | Status: DC

## 2023-11-09 MED ORDER — FOLIC ACID 1 MG PO TABS: 1.0000 mg | ORAL_TABLET | Freq: Every day | ORAL | 3 refills | Status: DC

## 2023-11-09 MED ORDER — SPIRONOLACTONE 25 MG PO TABS: 25.0000 mg | ORAL_TABLET | Freq: Every day | ORAL | 1 refills | Status: DC

## 2023-11-09 MED ORDER — CIPROFLOXACIN HCL 500 MG PO TABS: 500.0000 mg | ORAL_TABLET | Freq: Two times a day (BID) | ORAL | 0 refills | Status: DC

## 2023-11-09 MED ORDER — PANTOPRAZOLE SODIUM 40 MG PO TBEC: 40.0000 mg | DELAYED_RELEASE_TABLET | Freq: Two times a day (BID) | ORAL | 1 refills | Status: DC

## 2023-11-09 NOTE — Assessment & Plan Note (Signed)
 Would like to do pool therapy. Will refer to PT to see hat kind of PT would be appropriate. Call with any concerns.

## 2023-11-09 NOTE — Progress Notes (Addendum)
 BP 138/72   Pulse 91   Ht 6' 1 (1.854 m)   Wt 287 lb (130.2 kg)   SpO2 98%   BMI 37.87 kg/m    Subjective:    Patient ID: Eugene Henderson, male    DOB: 1951-05-13, 73 y.o.   MRN: [id number]  HPI: Eugene Henderson is a 73 y.o. male  Chief Complaint  Patient presents with   Diabetes   DIABETES Hypoglycemic episodes:no Polydipsia/polyuria: no Visual disturbance: no Chest pain: no Paresthesias: no Glucose Monitoring: yes  Accucheck frequency: occasionally Taking Insulin?: no Blood Pressure Monitoring: rarely Retinal Examination: Not up to Date Foot Exam: Up to Date Diabetic Education: Completed Pneumovax: Up to Date Influenza: Not up to Date Aspirin : no  HYPERTENSION / HYPERLIPIDEMIA Satisfied with current treatment? yes Duration of hypertension: chronic BP monitoring frequency: rarely BP medication side effects: no Duration of hyperlipidemia: chronic Cholesterol medication side effects: no Cholesterol supplements: none Past cholesterol medications: atorvastatin  Medication compliance: excellent compliance Aspirin : no Recent stressors: no Recurrent headaches: no Visual changes: no Palpitations: no Dyspnea: no Chest pain: no Lower extremity edema: no Dizzy/lightheaded: no  URINARY SYMPTOMS Duration: at least a week Dysuria: yes Urinary frequency: yes Urgency: yes Small volume voids: yes Symptom severity: moderate Urinary incontinence: yes Foul odor: yes Hematuria: yes Abdominal pain: no Back pain: yes Suprapubic pain/pressure: no Flank pain: no Fever:  no Vomiting: no Relief with cranberry juice: no Relief with pyridium: no Status: stable Previous urinary tract infection: yes Recurrent urinary tract infection: no History of sexually transmitted disease: no Penile discharge: no Treatments attempted: increasing fluids   Having difficulty getting around. Would like home health to come out to help.  Relevant past medical, surgical, family and  social history reviewed and updated as indicated. Interim medical history since our last visit reviewed. Allergies and medications reviewed and updated.  Review of Systems  Constitutional: Negative.   Respiratory: Negative.    Cardiovascular: Negative.   Genitourinary:  Positive for dysuria, frequency and urgency. Negative for decreased urine volume, difficulty urinating, enuresis, flank pain, genital sores, hematuria, penile discharge, penile pain, penile swelling, scrotal swelling and testicular pain.  Musculoskeletal:  Positive for arthralgias, back pain, gait problem and myalgias. Negative for joint swelling, neck pain and neck stiffness.  Neurological:  Negative for dizziness, tremors, seizures, syncope, facial asymmetry, speech difficulty, weakness, light-headedness, numbness and headaches.  Psychiatric/Behavioral: Negative.      Per HPI unless specifically indicated above     Objective:     BP 138/72   Pulse 91   Ht 6' 1 (1.854 m)   Wt 287 lb (130.2 kg)   SpO2 98%   BMI 37.87 kg/m   Wt Readings from Last 3 Encounters:  11/09/23 287 lb (130.2 kg)  11/07/23 275 lb (124.7 kg)  08/17/23 285 lb (129.3 kg)    Physical Exam Vitals and nursing note reviewed.  Constitutional:      General: He is not in acute distress.    Appearance: Normal appearance. He is obese. He is not ill-appearing, toxic-appearing or diaphoretic.  HENT:     Head: Normocephalic and atraumatic.     Right Ear: External ear normal.     Left Ear: External ear normal.     Nose: Nose normal.     Mouth/Throat:     Mouth: Mucous membranes are moist.     Pharynx: Oropharynx is clear.  Eyes:     General: No scleral icterus.       Right eye: No  discharge.        Left eye: No discharge.     Extraocular Movements: Extraocular movements intact.     Conjunctiva/sclera: Conjunctivae normal.     Pupils: Pupils are equal, round, and reactive to light.  Cardiovascular:     Rate and Rhythm: Normal rate and  regular rhythm.     Pulses: Normal pulses.     Heart sounds: Normal heart sounds. No murmur heard.    No friction rub. No gallop.  Pulmonary:     Effort: Pulmonary effort is normal. No respiratory distress.     Breath sounds: Normal breath sounds. No stridor. No wheezing, rhonchi or rales.  Chest:     Chest wall: No tenderness.  Musculoskeletal:        General: Normal range of motion.     Cervical back: Normal range of motion and neck supple.  Skin:    General: Skin is warm and dry.     Capillary Refill: Capillary refill takes less than 2 seconds.     Coloration: Skin is not jaundiced or pale.     Findings: No bruising, erythema, lesion or rash.  Neurological:     General: No focal deficit present.     Mental Status: He is alert and oriented to person, place, and time. Mental status is at baseline.  Psychiatric:        Mood and Affect: Mood normal.        Behavior: Behavior normal.        Thought Content: Thought content normal.        Judgment: Judgment normal.     Results for orders placed or performed in visit on 11/09/23  Bayer DCA Hb A1c Waived   Collection Time: 11/09/23  8:56 AM  Result Value Ref Range   HB A1C (BAYER DCA - WAIVED) 7.1 (H) 4.8 - 5.6 %  Microscopic Examination   Collection Time: 11/09/23  9:28 AM   Urine  Result Value Ref Range   WBC, UA 6-10 (A) 0 - 5 /hpf   RBC, Urine 0-2 0 - 2 /hpf   Epithelial Cells (non renal) 0-10 0 - 10 /hpf   Bacteria, UA Few (A) None seen/Few  Urinalysis, Routine w reflex microscopic   Collection Time: 11/09/23  9:28 AM  Result Value Ref Range   Specific Gravity, UA 1.010 1.005 - 1.030   pH, UA 5.5 5.0 - 7.5   Color, UA Yellow Yellow   Appearance Ur Cloudy (A) Clear   Leukocytes,UA 1+ (A) Negative   Protein,UA 1+ (A) Negative/Trace   Glucose, UA 3+ (A) Negative   Ketones, UA Negative Negative   RBC, UA 1+ (A) Negative   Bilirubin, UA Negative Negative   Urobilinogen, Ur 0.2 0.2 - 1.0 mg/dL   Nitrite, UA Positive  (A) Negative   Microscopic Examination See below:       Assessment & Plan:   Problem List Items Addressed This Visit       Cardiovascular and Mediastinum   Essential hypertension   Under good control on current regimen. Continue current regimen. Continue to monitor. Call with any concerns. Refills given. Labs drawn today.        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 Will work on diet and recheck in 3 months- if still >7 consider adding a GLP1  Relevant Medications   atorvastatin  (LIPITOR) 40 MG tablet   metFORMIN  (GLUCOPHAGE ) 500 MG tablet   spironolactone  (ALDACTONE ) 25 MG tablet   Other Relevant Orders   Bayer DCA Hb A1c Waived (Completed)   CBC with Differential/Platelet   Comprehensive metabolic panel with GFR     Respiratory   COPD (chronic obstructive pulmonary disease) (HCC)   Under good control on current regimen. Continue current regimen. Continue to monitor. Call with any concerns. Refills given. Labs drawn today.        Relevant Medications   albuterol  (VENTOLIN  HFA) 108 (90 Base) MCG/ACT inhaler     Musculoskeletal and Integument   Post-traumatic osteoarthritis   Would like to do pool therapy. Will refer to PT to see hat kind of PT would be appropriate. Call with any concerns.       Relevant Orders   Ambulatory referral to Physical Therapy     Other   Chronic low back pain   Would like to do pool therapy. Will refer to PT to see hat kind of PT would be appropriate. Call with any concerns.       Relevant Orders   Ambulatory referral to Physical Therapy   Other Visit Diagnoses       Acute cystitis with hematuria       Will treat with cipro  and await culture. Call with any concerns.     Folate deficiency       Rechecking labs. Await results. Treat as needed.   Relevant Orders   Folate      Cardiomyopathy, unspecified type (HCC)       Relevant Medications   atorvastatin  (LIPITOR) 40 MG tablet   folic acid  (FOLVITE ) 1 MG tablet   spironolactone  (ALDACTONE ) 25 MG tablet     Dysuria       + Leuks and nitrates   Relevant Orders   Urinalysis, Routine w reflex microscopic (Completed)   Urine Culture        Follow up plan: Return in about 3 months (around 02/09/2024).

## 2023-11-09 NOTE — Assessment & Plan Note (Signed)
 A1c up slightly at 7.1. Will work on diet and recheck in 3 months- if still >7 consider adding a GLP1

## 2023-11-09 NOTE — Assessment & Plan Note (Signed)
 Under good control on current regimen. Continue current regimen. Continue to monitor. Call with any concerns. Refills given. Labs drawn today.

## 2023-11-10 LAB — CBC WITH DIFFERENTIAL/PLATELET
Basophils Absolute: 0 10*3/uL (ref 0.0–0.2)
Basos: 1 %
EOS (ABSOLUTE): 0.2 10*3/uL (ref 0.0–0.4)
Eos: 5 %
Hematocrit: 43.9 % (ref 37.5–51.0)
Hemoglobin: 13.8 g/dL (ref 13.0–17.7)
Immature Grans (Abs): 0 10*3/uL (ref 0.0–0.1)
Immature Granulocytes: 0 %
Lymphocytes Absolute: 1.3 10*3/uL (ref 0.7–3.1)
Lymphs: 30 %
MCH: 27.8 pg (ref 26.6–33.0)
MCHC: 31.4 g/dL — ABNORMAL LOW (ref 31.5–35.7)
MCV: 89 fL (ref 79–97)
Monocytes Absolute: 0.5 10*3/uL (ref 0.1–0.9)
Monocytes: 12 %
Neutrophils Absolute: 2.4 10*3/uL (ref 1.4–7.0)
Neutrophils: 52 %
Platelets: 190 10*3/uL (ref 150–450)
RBC: 4.96 x10E6/uL (ref 4.14–5.80)
RDW: 14.6 % (ref 11.6–15.4)
WBC: 4.5 10*3/uL (ref 3.4–10.8)

## 2023-11-10 LAB — COMPREHENSIVE METABOLIC PANEL WITH GFR
ALT: 12 IU/L (ref 0–44)
AST: 14 IU/L (ref 0–40)
Albumin: 4.5 g/dL (ref 3.8–4.8)
Alkaline Phosphatase: 150 IU/L — ABNORMAL HIGH (ref 44–121)
BUN/Creatinine Ratio: 16 (ref 10–24)
BUN: 17 mg/dL (ref 8–27)
Bilirubin Total: 0.7 mg/dL (ref 0.0–1.2)
CO2: 18 mmol/L — ABNORMAL LOW (ref 20–29)
Calcium: 9 mg/dL (ref 8.6–10.2)
Chloride: 101 mmol/L (ref 96–106)
Creatinine, Ser: 1.05 mg/dL (ref 0.76–1.27)
Globulin, Total: 3.1 g/dL (ref 1.5–4.5)
Glucose: 109 mg/dL — ABNORMAL HIGH (ref 70–99)
Potassium: 4.1 mmol/L (ref 3.5–5.2)
Sodium: 139 mmol/L (ref 134–144)
Total Protein: 7.6 g/dL (ref 6.0–8.5)
eGFR: 75 mL/min/{1.73_m2} (ref >59)

## 2023-11-10 LAB — FOLATE: Folate: 7.1 ng/mL (ref >3.0)

## 2023-11-14 ENCOUNTER — Ambulatory Visit: Payer: Self-pay | Admitting: Family Medicine

## 2023-11-14 ENCOUNTER — Encounter: Admitting: Family Medicine

## 2023-11-14 LAB — URINE CULTURE

## 2023-11-16 ENCOUNTER — Telehealth (HOSPITAL_COMMUNITY): Payer: Self-pay | Admitting: *Deleted

## 2023-11-16 NOTE — Telephone Encounter (Signed)
 Attempted to call patient regarding upcoming cardiac PET appointment. Left message on voicemail with name and callback number Johney Frame RN Navigator Cardiac Imaging [facility] [phone] Office  Advised to avoid caffeine for 12 hours prior to test.

## 2023-11-17 ENCOUNTER — Ambulatory Visit
Admission: RE | Admit: 2023-11-17 | Discharge: 2023-11-17 | Disposition: A | Discharge status: Home / Self Care | Source: Ambulatory Visit | Attending: Cardiology | Admitting: Cardiology

## 2023-11-17 DIAGNOSIS — R931 Abnormal findings on diagnostic imaging of heart and coronary circulation: Secondary | ICD-10-CM | POA: Diagnosis not present

## 2023-11-17 DIAGNOSIS — K449 Diaphragmatic hernia without obstruction or gangrene: Secondary | ICD-10-CM | POA: Diagnosis not present

## 2023-11-17 DIAGNOSIS — I2510 Atherosclerotic heart disease of native coronary artery without angina pectoris: Secondary | ICD-10-CM | POA: Insufficient documentation

## 2023-11-17 DIAGNOSIS — R079 Chest pain, unspecified: Secondary | ICD-10-CM

## 2023-11-17 MED ORDER — REGADENOSON 0.4 MG/5ML IV SOLN
0.4000 mg | Freq: Once | INTRAVENOUS | Status: AC
  Administered 2023-11-17: 0.4 mg via INTRAVENOUS
  Filled 2023-11-17: qty 5

## 2023-11-17 MED ORDER — RUBIDIUM RB82 GENERATOR (RUBYFILL)
25.0000 | PACK | Freq: Once | INTRAVENOUS | Status: AC
Start: 2023-11-17 — End: 2023-11-17
  Administered 2023-11-17: 24.95 via INTRAVENOUS

## 2023-11-17 MED ORDER — RUBIDIUM RB82 GENERATOR (RUBYFILL)
25.0000 | PACK | Freq: Once | INTRAVENOUS | Status: AC
  Administered 2023-11-17: 24.93 via INTRAVENOUS

## 2023-11-17 MED ORDER — REGADENOSON 0.4 MG/5ML IV SOLN
INTRAVENOUS | Status: AC
  Filled 2023-11-17: qty 5

## 2023-11-17 NOTE — Telephone Encounter (Signed)
 Forwarding back to you as a reminder for the home health referral

## 2023-11-20 NOTE — Addendum Note (Signed)
 Addended by: Solomon Dupre on: 11/20/2023 08:42 PM   Modules accepted: Orders

## 2023-11-21 LAB — NM PET CT CARDIAC PERFUSION MULTI W/ABSOLUTE BLOODFLOW
MBFR: 2.04
Nuc Rest EF: 30 %
Nuc Stress EF: 36 %
Peak HR: 87 {beats}/min
Rest HR: 80 {beats}/min
Rest MBF: 0.72 ml/g/min
Rest Nuclear Isotope Dose: 24.9 mCi
SRS: 3
SSS: 3
ST Depression (mm): 0 mm
Stress MBF: 1.47 ml/g/min
Stress Nuclear Isotope Dose: 25 mCi
TID: 1.07

## 2023-11-28 ENCOUNTER — Ambulatory Visit: Payer: Self-pay | Admitting: Cardiology

## 2023-12-05 DIAGNOSIS — J432 Centrilobular emphysema: Secondary | ICD-10-CM

## 2023-12-05 DIAGNOSIS — E538 Deficiency of other specified B group vitamins: Secondary | ICD-10-CM

## 2023-12-05 DIAGNOSIS — N3001 Acute cystitis with hematuria: Secondary | ICD-10-CM

## 2023-12-05 DIAGNOSIS — I110 Hypertensive heart disease with heart failure: Secondary | ICD-10-CM | POA: Diagnosis not present

## 2023-12-05 DIAGNOSIS — G8929 Other chronic pain: Secondary | ICD-10-CM | POA: Diagnosis not present

## 2023-12-05 DIAGNOSIS — M545 Low back pain, unspecified: Secondary | ICD-10-CM | POA: Diagnosis not present

## 2023-12-05 DIAGNOSIS — H9190 Unspecified hearing loss, unspecified ear: Secondary | ICD-10-CM

## 2023-12-05 DIAGNOSIS — J4489 Other specified chronic obstructive pulmonary disease: Secondary | ICD-10-CM

## 2023-12-05 DIAGNOSIS — I2510 Atherosclerotic heart disease of native coronary artery without angina pectoris: Secondary | ICD-10-CM

## 2023-12-05 DIAGNOSIS — E1159 Type 2 diabetes mellitus with other circulatory complications: Secondary | ICD-10-CM | POA: Diagnosis not present

## 2023-12-05 DIAGNOSIS — I509 Heart failure, unspecified: Secondary | ICD-10-CM

## 2023-12-05 DIAGNOSIS — I429 Cardiomyopathy, unspecified: Secondary | ICD-10-CM

## 2023-12-08 ENCOUNTER — Encounter: Payer: Self-pay | Admitting: Medical

## 2023-12-08 ENCOUNTER — Ambulatory Visit: Attending: Medical | Admitting: Medical

## 2023-12-08 VITALS — BP 90/50 | HR 94 | Ht 73.0 in | Wt 278.8 lb

## 2023-12-08 DIAGNOSIS — R0602 Shortness of breath: Secondary | ICD-10-CM

## 2023-12-08 DIAGNOSIS — Z95810 Presence of automatic (implantable) cardiac defibrillator: Secondary | ICD-10-CM

## 2023-12-08 DIAGNOSIS — E782 Mixed hyperlipidemia: Secondary | ICD-10-CM

## 2023-12-08 DIAGNOSIS — R079 Chest pain, unspecified: Secondary | ICD-10-CM

## 2023-12-08 DIAGNOSIS — R9439 Abnormal result of other cardiovascular function study: Secondary | ICD-10-CM | POA: Diagnosis not present

## 2023-12-08 DIAGNOSIS — Z720 Tobacco use: Secondary | ICD-10-CM

## 2023-12-08 DIAGNOSIS — I10 Essential (primary) hypertension: Secondary | ICD-10-CM | POA: Diagnosis not present

## 2023-12-08 DIAGNOSIS — F1010 Alcohol abuse, uncomplicated: Secondary | ICD-10-CM

## 2023-12-08 DIAGNOSIS — I77810 Thoracic aortic ectasia: Secondary | ICD-10-CM

## 2023-12-08 DIAGNOSIS — I5022 Chronic systolic (congestive) heart failure: Secondary | ICD-10-CM

## 2023-12-08 DIAGNOSIS — Z79899 Other long term (current) drug therapy: Secondary | ICD-10-CM

## 2023-12-08 DIAGNOSIS — I428 Other cardiomyopathies: Secondary | ICD-10-CM

## 2023-12-08 DIAGNOSIS — I4821 Permanent atrial fibrillation: Secondary | ICD-10-CM

## 2023-12-08 MED ORDER — SACUBITRIL-VALSARTAN 49-51 MG PO TABS: 1.0000 | ORAL_TABLET | Freq: Two times a day (BID) | ORAL | 3 refills | Status: AC

## 2023-12-08 MED ORDER — BLOOD PRESSURE CUFF MISC: 0 refills | Status: AC

## 2023-12-08 MED ORDER — NITROGLYCERIN 0.4 MG SL SUBL: 0.4000 mg | SUBLINGUAL_TABLET | SUBLINGUAL | 3 refills | Status: DC | PRN

## 2023-12-08 NOTE — Patient Instructions (Signed)
 Medication Instructions:  Your physician recommends the following medication changes.  START TAKING: Nitroglycerin  0.4 mg (under the tongue dissolving) as needed for chest pain - If a single episode of chest pain is not relieved by one tablet in 5 minutes, take another tablet and after 5 minutes if chest pain continues, take the 3rd tablet; and if this doesn't relieve the pain, call 911 for transportation to an emergency department.   DECREASE: Entresto  to 49-51 mg twice daily  *If you need a refill on your cardiac medications before your next appointment, please call your pharmacy*  Lab Work: Your provider would like for you to have following labs drawn today CBC and BMeT.   If you have labs (blood work) drawn today and your tests are completely normal, you will receive your results only by: MyChart Message (if you have MyChart) OR A paper copy in the mail If you have any lab test that is abnormal or we need to change your treatment, we will call you to review the results.  Testing/Procedures:  [facility] A DEPT OF [place] [address] [place] [address] Dept: [phone] Loc: [phone]  Kit Mollett  12/08/2023  You are scheduled for a Cardiac Catheterization on Friday, July 11 with Dr. Deatrice Cage.  1. Please arrive at the [facility] Entrance of [facility], [address], [place] [address] at 9:30 (This is 1 hour(s) prior to your procedure time).  Proceed to the Check-In Desk directly inside the entrance.  Procedure Parking: Use the entrance off of the [address] side of the hospital. Turn right upon entering and follow the driveway to parking that is directly in front of the [facility] Center. There is no valet parking available at this entrance, however there is an awning directly in front of the [facility] Vascular Center for drop off/ pick up for  patients.  Special note: Every effort is made to have your procedure done on time. Please understand that emergencies sometimes delay scheduled procedures.  2. Diet: Do not eat solid foods after midnight.  The patient may have clear liquids until 5am upon the day of the procedure.  3. Labs: You will need to have blood drawn today.  4. Medication instructions in preparation for your procedure:   Contrast Allergy: No   Stop taking Eliquis  (Apixiban) on Tuesday, July 8. AFTER your nighttime dose.  Stop taking, Lasix  (Furosemide )  and Spironolactone  Friday, July 11,  Do not take Diabetes Med Glucophage  (Metformin ) on the day of the procedure and HOLD 48 HOURS AFTER THE PROCEDURE.  Do not take Diabetes Med Jardiance  on the day of the procedure.  On the morning of your procedure, take your Aspirin  81 mg.  You may use sips of water.  5. Plan to go home the same day, you will only stay overnight if medically necessary. 6. Bring a current list of your medications and current insurance cards. 7. You MUST have a responsible person to drive you home. 8. Someone MUST be with you the first 24 hours after you arrive home or your discharge will be delayed. 9. Please wear clothes that are easy to get on and off and wear slip-on shoes.  Thank you for allowing us  to care for you!   -- [facility]   Follow-Up: At [facility], you and your health needs are our priority.  As part  of our continuing mission to provide you with exceptional heart care, our providers are all part of one team.  This team includes your primary Cardiologist (physician) and Advanced Practice Providers or APPs (Physician Assistants and Nurse Practitioners) who all work together to provide you with the care you need, when you need it.  Your next appointment:   2 week(s) after 11 July (cath)  Provider:   You may see Deatrice Cage, MD or one of the following Advanced Practice  Providers on your designated Care Team:   Lonni Meager, NP Lesley Maffucci, PA-C Bernardino Bring, PA-C Cadence Cora, PA-C Tylene Lunch, NP Barnie Hila, NP    BP cuff script given to hand carry.

## 2023-12-08 NOTE — H&P (View-Only) (Signed)
 Cardiology Office Note   Date:  12/08/2023  ID:  Eugene Henderson, DOB 1950-09-26, MRN [id number] PCP: Louder Duwaine SQUIBB, DO  [facility] Providers Cardiologist:  Deatrice Cage, MD Electrophysiologist:  OLE ONEIDA HOLTS, MD   History of Present Illness Eugene Henderson is a 73 y.o. male with a h/o NICM based on cath in 2018, HTN, DM2, HFrEF, permanent Afib, tobacco use and alcohol use who presents for follow up for abnormal stress test.    He was admitted 06/14/22 with SOB and chest pain. He was noted to be in afib RVR and underwent DCCV to NSR. 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 Echo 06/2022 showed LVEF 20-25% with severe RV dysfunction. Cardiac MRI 2/24 showed LVEF 17%, RVEF 36%, small area of lateral wall subendocardial LGE, mid-wall LGE at this inferoseptal RV insertion site. There was a very small area of possible prior MI, but this study suggested primarily NICM.   RHC 09/2022 showed normal filling pressures with mildly decreased CI at 2.2. he failed DCCV on amiodarone  09/2022. Biotronik ICD was placed 10/2022.   He has been following with heart failure and EP. He saw Dr. HOLTS 08/2023 reporting chest pain. PET stress was ordered which was abnormal partially reversible location. EF 30-35%. He was asked to come in to discuss heart cath.   Today, the patient reports worsening shortness of breath and occasional chest discomfort. He has exertional chest pain when walking to the bathroom. He feels breathing is worse when he lays down or walks.Blood pressure is low today, he is unsure if it has been low at home. He has been taking all his cardiac medications. He has a home care nurse that comes once weekly. He is smoking 6 cigarettes daily. He goes through 12-15 pack of beer that he drinks over the weekend.   Studies Reviewed EKG Interpretation Date/Time:  Thursday  December 08 2023 15:05:02 EDT Ventricular Rate:  94 PR Interval:    QRS Duration:  100 QT Interval:  372 QTC Calculation: 465 R Axis:   -52  Text Interpretation: Atrial fibrillation Incomplete right bundle branch block Left anterior fascicular block Inferior infarct (cited on or before 05-Nov-2022) When compared with ECG of 15-Feb-2023 09:53, Nonspecific T wave abnormality, improved in Lateral leads Confirmed by Franchester, Deliah Strehlow ([id number]) on 12/08/2023 3:12:04 PM    Cardiac PET stress test 11/2023   Findings are consistent with small area of inferoapical infarction with peri-infarct ischemia. The study is intermediate risk due to reduced EF - consider correlation with echo.   ECG rhythm shows atrial fibrillation which may affect accuracy of gated data such as volumes and EF.   No ST deviation was noted.   LV perfusion is abnormal. Defect 1: There is a small defect with mild reduction in uptake present in the apical to mid inferior location(s) that is partially reversible. There is abnormal wall motion in the defect area. Consistent with infarction and peri-infarct ischemia.   Rest left ventricular function is abnormal. Rest global function is moderately reduced. Rest EF: 30%. Stress left ventricular function is abnormal. Stress global function is moderately reduced. Stress EF: 36%. End diastolic cavity size is severely enlarged. End systolic cavity size is severely enlarged. No evidence of transient ischemic dilation (TID) noted.   Myocardial blood flow was computed to be 0.60ml/g/min at rest and 1.47ml/g/min at stress. Global myocardial blood flow reserve was 2.04  and was normal.   Coronary calcium  was present on the attenuation correction CT images. Mild coronary calcifications were present. Coronary calcifications were present in the left anterior descending artery, left circumflex artery and right coronary artery distribution(s).   RHC 09/2022 1. Mildly elevated RA pressure, normal PCWP.  2. Mild  pulmonary venous hypertension.  3. Mildly decreased cardiac output (CI 2.22), preserved PAPi.    No changes to medication regimen at this time.    CMRI 09/2022 IMPRESSION: 1.  Severely reduced LV function, LVEF 17%.   2. Small lateral wall subendocardial LGE (scar) indicating possible prior infarct.   3. Mid wall LGE (scar) in the LV basal-mid septal wall at RV insertion points suggesting non ischemic etiology.   4.  No diffuse myocardial scar or infiltrative disease noted.   5.  Moderately dilated ascending aorta, 47 mm in diameter.   6.  Moderately reduced RV function.   7.  Findings suggest non ischemic cardiomyopathy.   Echo TEE 06/2022 1. Left ventricular ejection fraction, by estimation, is 20 to 25%. The  left ventricle has severely decreased function. The left ventricle  demonstrates global hypokinesis. The left ventricular internal cavity size  was mildly dilated. Left ventricular  diastolic function could not be evaluated.   2. Right ventricular systolic function is moderately reduced. The right  ventricular size is moderately enlarged. Mildly increased right  ventricular wall thickness.   3. Left atrial size was moderately dilated. No left atrial/left atrial  appendage thrombus was detected.   4. Right atrial size was severely dilated.   5. There is a very small fibrinous echodensity on the atrial side of the  mitral valve measuring 0.9cm; this is unlikely to represent endocarditis.  . The mitral valve is normal in structure. Moderate to severe mitral valve  regurgitation. No evidence of  mitral stenosis.   6. The aortic valve is grossly normal. Aortic valve regurgitation is  mild.   Echo 06/2022 1. Left ventricular ejection fraction, by estimation, is 20 to 25%. The  left ventricle has severely decreased function. The left ventricle  demonstrates global hypokinesis. The left ventricular internal cavity size  was severely dilated. There is mild  left  ventricular hypertrophy. Left ventricular diastolic function could  not be evaluated.   2. Right ventricular systolic function is severely reduced. The right  ventricular size is mildly enlarged. There is moderately elevated  pulmonary artery systolic pressure.   3. Left atrial size was severely dilated.   4. Right atrial size was severely dilated.   5. The mitral valve is normal in structure. Moderate mitral valve  regurgitation.   6. Tricuspid valve regurgitation is mild to moderate.   7. The aortic valve is tricuspid. Aortic valve regurgitation is trivial.  No aortic stenosis is present.   8. Aortic dilatation noted. 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 There is severe dilatation of the ascending aorta,  measuring 50 mm.   9. The inferior vena cava is dilated in size with <50% respiratory  variability, suggesting right atrial pressure of 15 mmHg.   Echo 03/2021 1. Left ventricular ejection fraction, by estimation, is 30%. The left  ventricle has severely decreased function. The left ventricle demonstrates  global hypokinesis. The left ventricular internal cavity size was mildly  dilated. There is mild left  ventricular hypertrophy. Left ventricular diastolic parameters are  indeterminate.   2. Right ventricular systolic function is low normal. The right  ventricular size is normal.  3. Left atrial size was severely dilated.   4. Right atrial size was severely dilated.   5. The mitral valve is normal in structure. Mild mitral valve  regurgitation.   6. The aortic valve was not well visualized. Aortic valve regurgitation  is not visualized.   7. Aortic dilatation noted. 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    R/L heart cath 2018 1. No significant coronary artery disease. 2. Severely reduced LV systolic function by echo. Left ventricular angiography was not performed.  3. Right heart  catheterization showed minimally elevated filling pressures and pulmonary hypertension. Moderately reduced cardiac output. RA pressure: 12 mmHg, RV pressure: 40 over 6 mmHg, PA pressure 37/12 with a mean of 12 mmHg. Pulmonary capillary wedge pressure was 12. Cardiac output was 4.16 with a cardiac index of 1.77.   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 His volume status appears to be close to normal. Thus, I switched furosemide  to 40 mg by mouth twice daily. Ventricular rate is still not optimally controlled and thus I increased Toprol  to 200 mg once daily. Start long-term anticoagulation with Eliquis  5 mg twice daily starting tomorrow.  The patient was noted to have apnea episodes during cardiac catheterization. Recommend outpatient evaluation for sleep apnea. Avoid catheterization via the right radial artery in the future due to tortuous innominate artery and significantly dilated aortic root.      Physical Exam VS:  BP (!) 90/50 (BP Location: Left Arm, Patient Position: Sitting, Cuff Size: Large)   Pulse 94   Ht 6' 1 (1.854 m)   Wt 278 lb 12.8 oz (126.5 kg)   SpO2 99%   BMI 36.78 kg/m        Wt Readings from Last 3 Encounters:  12/08/23 278 lb 12.8 oz (126.5 kg)  11/09/23 287 lb (130.2 kg)  11/07/23 275 lb (124.7 kg)    GEN: Well nourished, well developed in no acute distress NECK: No JVD; No carotid bruits CARDIAC: Irreg Irreg, no murmurs, rubs, gallops RESPIRATORY:  Clear to auscultation without rales, wheezing or rhonchi  ABDOMEN: Soft, non-tender, non-distended EXTREMITIES:  No edema; No deformity   ASSESSMENT AND PLAN  Chest pain and SOB Abnormal stress test Prior cath in 2018 with no significant CAD. He had a recent abnormal stress test (report above). He reports persistent shortness of breath and exertional chest pain. We will set him up for right and left heart cath. We will hold Eliquis  2 days before the cath.    HTN Bp today is low. He reports good oral intake. I will decrease Entresto  to 49-51mg BID. Continue Toprol  150mg  daily, spiro 25mg  daily, and lasix  40mg  daily.   Chronic systolic heart failure NICM s/p ICD Long history of low EF, suspected tachy-mediated. Most recent echo 2024 showed LVEF 20-25%. cMRI 2024 showed LVEF 36%. Cardiac PET stress showed LVEF 30-35%. Continue Jardiance , Entresto , digoxin , spironolactone , lasix  40mg  daily and potassium. He appears euvolemic on exam. R/L heart cath as above.   HLD LDL 23. Continue Lipitor 40mg  daily.   Permanent Afib EKG shows rate controlled Afib. Hold Eliquis  for cath as above.   Alcohol use  He drink 12-15 beers weekly, cessation recommended.   Tobacco use He smokes 6 cigarhettes daily, cessation recommended.   AAA/TAA Follows with vascular surgery.    Informed Consent   Shared Decision Making/Informed Consent The risks [stroke (1 in 1000), death (1 in 1000), kidney failure [facility]  temporary] (1 in 500), bleeding (1 in 200), allergic reaction [possibly serious] (1 in 200)], benefits (diagnostic support and management of coronary artery disease) and alternatives of a cardiac catheterization were discussed in detail with Mr. Couey and he is willing to proceed.        Dispo: Follow-up in 3 weeks/after cath  Signed, Adrick Kestler VEAR Fishman, PA-C

## 2023-12-08 NOTE — Progress Notes (Signed)
 Cardiology Office Note   Date:  12/08/2023  ID:  Eugene Henderson, DOB 1950-09-26, MRN [id number] PCP: Eugene Duwaine SQUIBB, DO  [facility] Providers Cardiologist:  Deatrice Cage, MD Electrophysiologist:  OLE ONEIDA HOLTS, MD   History of Present Illness Eugene Henderson is a 73 y.o. male with a h/o NICM based on cath in 2018, HTN, DM2, HFrEF, permanent Afib, tobacco use and alcohol use who presents for follow up for abnormal stress test.    He was admitted 06/14/22 with SOB and chest pain. He was noted to be in afib RVR and underwent DCCV to NSR. 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 Echo 06/2022 showed LVEF 20-25% with severe RV dysfunction. Cardiac MRI 2/24 showed LVEF 17%, RVEF 36%, small area of lateral wall subendocardial LGE, mid-wall LGE at this inferoseptal RV insertion site. There was a very small area of possible prior MI, but this study suggested primarily NICM.   RHC 09/2022 showed normal filling pressures with mildly decreased CI at 2.2. he failed DCCV on amiodarone  09/2022. Biotronik ICD was placed 10/2022.   He has been following with heart failure and EP. He saw Dr. HOLTS 08/2023 reporting chest pain. PET stress was ordered which was abnormal partially reversible location. EF 30-35%. He was asked to come in to discuss heart cath.   Today, the patient reports worsening shortness of breath and occasional chest discomfort. He has exertional chest pain when walking to the bathroom. He feels breathing is worse when he lays down or walks.Blood pressure is low today, he is unsure if it has been low at home. He has been taking all his cardiac medications. He has a home care nurse that comes once weekly. He is smoking 6 cigarettes daily. He goes through 12-15 pack of beer that he drinks over the weekend.   Studies Reviewed EKG Interpretation Date/Time:  Thursday  December 08 2023 15:05:02 EDT Ventricular Rate:  94 PR Interval:    QRS Duration:  100 QT Interval:  372 QTC Calculation: 465 R Axis:   -52  Text Interpretation: Atrial fibrillation Incomplete right bundle branch block Left anterior fascicular block Inferior infarct (cited on or before 05-Nov-2022) When compared with ECG of 15-Feb-2023 09:53, Nonspecific T wave abnormality, improved in Lateral leads Confirmed by Franchester, Deliah Strehlow ([id number]) on 12/08/2023 3:12:04 PM    Cardiac PET stress test 11/2023   Findings are consistent with small area of inferoapical infarction with peri-infarct ischemia. The study is intermediate risk due to reduced EF - consider correlation with echo.   ECG rhythm shows atrial fibrillation which may affect accuracy of gated data such as volumes and EF.   No ST deviation was noted.   LV perfusion is abnormal. Defect 1: There is a small defect with mild reduction in uptake present in the apical to mid inferior location(s) that is partially reversible. There is abnormal wall motion in the defect area. Consistent with infarction and peri-infarct ischemia.   Rest left ventricular function is abnormal. Rest global function is moderately reduced. Rest EF: 30%. Stress left ventricular function is abnormal. Stress global function is moderately reduced. Stress EF: 36%. End diastolic cavity size is severely enlarged. End systolic cavity size is severely enlarged. No evidence of transient ischemic dilation (TID) noted.   Myocardial blood flow was computed to be 0.60ml/g/min at rest and 1.47ml/g/min at stress. Global myocardial blood flow reserve was 2.04  and was normal.   Coronary calcium  was present on the attenuation correction CT images. Mild coronary calcifications were present. Coronary calcifications were present in the left anterior descending artery, left circumflex artery and right coronary artery distribution(s).   RHC 09/2022 1. Mildly elevated RA pressure, normal PCWP.  2. Mild  pulmonary venous hypertension.  3. Mildly decreased cardiac output (CI 2.22), preserved PAPi.    No changes to medication regimen at this time.    CMRI 09/2022 IMPRESSION: 1.  Severely reduced LV function, LVEF 17%.   2. Small lateral wall subendocardial LGE (scar) indicating possible prior infarct.   3. Mid wall LGE (scar) in the LV basal-mid septal wall at RV insertion points suggesting non ischemic etiology.   4.  No diffuse myocardial scar or infiltrative disease noted.   5.  Moderately dilated ascending aorta, 47 mm in diameter.   6.  Moderately reduced RV function.   7.  Findings suggest non ischemic cardiomyopathy.   Echo TEE 06/2022 1. Left ventricular ejection fraction, by estimation, is 20 to 25%. The  left ventricle has severely decreased function. The left ventricle  demonstrates global hypokinesis. The left ventricular internal cavity size  was mildly dilated. Left ventricular  diastolic function could not be evaluated.   2. Right ventricular systolic function is moderately reduced. The right  ventricular size is moderately enlarged. Mildly increased right  ventricular wall thickness.   3. Left atrial size was moderately dilated. No left atrial/left atrial  appendage thrombus was detected.   4. Right atrial size was severely dilated.   5. There is a very small fibrinous echodensity on the atrial side of the  mitral valve measuring 0.9cm; this is unlikely to represent endocarditis.  . The mitral valve is normal in structure. Moderate to severe mitral valve  regurgitation. No evidence of  mitral stenosis.   6. The aortic valve is grossly normal. Aortic valve regurgitation is  mild.   Echo 06/2022 1. Left ventricular ejection fraction, by estimation, is 20 to 25%. The  left ventricle has severely decreased function. The left ventricle  demonstrates global hypokinesis. The left ventricular internal cavity size  was severely dilated. There is mild  left  ventricular hypertrophy. Left ventricular diastolic function could  not be evaluated.   2. Right ventricular systolic function is severely reduced. The right  ventricular size is mildly enlarged. There is moderately elevated  pulmonary artery systolic pressure.   3. Left atrial size was severely dilated.   4. Right atrial size was severely dilated.   5. The mitral valve is normal in structure. Moderate mitral valve  regurgitation.   6. Tricuspid valve regurgitation is mild to moderate.   7. The aortic valve is tricuspid. Aortic valve regurgitation is trivial.  No aortic stenosis is present.   8. Aortic dilatation noted. 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 There is severe dilatation of the ascending aorta,  measuring 50 mm.   9. The inferior vena cava is dilated in size with <50% respiratory  variability, suggesting right atrial pressure of 15 mmHg.   Echo 03/2021 1. Left ventricular ejection fraction, by estimation, is 30%. The left  ventricle has severely decreased function. The left ventricle demonstrates  global hypokinesis. The left ventricular internal cavity size was mildly  dilated. There is mild left  ventricular hypertrophy. Left ventricular diastolic parameters are  indeterminate.   2. Right ventricular systolic function is low normal. The right  ventricular size is normal.  3. Left atrial size was severely dilated.   4. Right atrial size was severely dilated.   5. The mitral valve is normal in structure. Mild mitral valve  regurgitation.   6. The aortic valve was not well visualized. Aortic valve regurgitation  is not visualized.   7. Aortic dilatation noted. 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    R/L heart cath 2018 1. No significant coronary artery disease. 2. Severely reduced LV systolic function by echo. Left ventricular angiography was not performed.  3. Right heart  catheterization showed minimally elevated filling pressures and pulmonary hypertension. Moderately reduced cardiac output. RA pressure: 12 mmHg, RV pressure: 40 over 6 mmHg, PA pressure 37/12 with a mean of 12 mmHg. Pulmonary capillary wedge pressure was 12. Cardiac output was 4.16 with a cardiac index of 1.77.   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 His volume status appears to be close to normal. Thus, I switched furosemide  to 40 mg by mouth twice daily. Ventricular rate is still not optimally controlled and thus I increased Toprol  to 200 mg once daily. Start long-term anticoagulation with Eliquis  5 mg twice daily starting tomorrow.  The patient was noted to have apnea episodes during cardiac catheterization. Recommend outpatient evaluation for sleep apnea. Avoid catheterization via the right radial artery in the future due to tortuous innominate artery and significantly dilated aortic root.      Physical Exam VS:  BP (!) 90/50 (BP Location: Left Arm, Patient Position: Sitting, Cuff Size: Large)   Pulse 94   Ht 6' 1 (1.854 m)   Wt 278 lb 12.8 oz (126.5 kg)   SpO2 99%   BMI 36.78 kg/m        Wt Readings from Last 3 Encounters:  12/08/23 278 lb 12.8 oz (126.5 kg)  11/09/23 287 lb (130.2 kg)  11/07/23 275 lb (124.7 kg)    GEN: Well nourished, well developed in no acute distress NECK: No JVD; No carotid bruits CARDIAC: Irreg Irreg, no murmurs, rubs, gallops RESPIRATORY:  Clear to auscultation without rales, wheezing or rhonchi  ABDOMEN: Soft, non-tender, non-distended EXTREMITIES:  No edema; No deformity   ASSESSMENT AND PLAN  Chest pain and SOB Abnormal stress test Prior cath in 2018 with no significant CAD. He had a recent abnormal stress test (report above). He reports persistent shortness of breath and exertional chest pain. We will set him up for right and left heart cath. We will hold Eliquis  2 days before the cath.    HTN Bp today is low. He reports good oral intake. I will decrease Entresto  to 49-51mg BID. Continue Toprol  150mg  daily, spiro 25mg  daily, and lasix  40mg  daily.   Chronic systolic heart failure NICM s/p ICD Long history of low EF, suspected tachy-mediated. Most recent echo 2024 showed LVEF 20-25%. cMRI 2024 showed LVEF 36%. Cardiac PET stress showed LVEF 30-35%. Continue Jardiance , Entresto , digoxin , spironolactone , lasix  40mg  daily and potassium. He appears euvolemic on exam. R/L heart cath as above.   HLD LDL 23. Continue Lipitor 40mg  daily.   Permanent Afib EKG shows rate controlled Afib. Hold Eliquis  for cath as above.   Alcohol use  He drink 12-15 beers weekly, cessation recommended.   Tobacco use He smokes 6 cigarhettes daily, cessation recommended.   AAA/TAA Follows with vascular surgery.    Informed Consent   Shared Decision Making/Informed Consent The risks [stroke (1 in 1000), death (1 in 1000), kidney failure [facility]  temporary] (1 in 500), bleeding (1 in 200), allergic reaction [possibly serious] (1 in 200)], benefits (diagnostic support and management of coronary artery disease) and alternatives of a cardiac catheterization were discussed in detail with Mr. Couey and he is willing to proceed.        Dispo: Follow-up in 3 weeks/after cath  Signed, Adrick Kestler VEAR Fishman, PA-C

## 2023-12-09 LAB — CBC
Hematocrit: 45 % (ref 37.5–51.0)
Hemoglobin: 14.7 g/dL (ref 13.0–17.7)
MCH: 27.8 pg (ref 26.6–33.0)
MCHC: 32.7 g/dL (ref 31.5–35.7)
MCV: 85 fL (ref 79–97)
Platelets: 176 x10E3/uL (ref 150–450)
RBC: 5.29 x10E6/uL (ref 4.14–5.80)
RDW: 15.6 % — ABNORMAL HIGH (ref 11.6–15.4)
WBC: 5 x10E3/uL (ref 3.4–10.8)

## 2023-12-09 LAB — BASIC METABOLIC PANEL WITH GFR
BUN/Creatinine Ratio: 17 (ref 10–24)
BUN: 18 mg/dL (ref 8–27)
CO2: 15 mmol/L — ABNORMAL LOW (ref 20–29)
Calcium: 9.5 mg/dL (ref 8.6–10.2)
Chloride: 95 mmol/L — ABNORMAL LOW (ref 96–106)
Creatinine, Ser: 1.09 mg/dL (ref 0.76–1.27)
Glucose: 101 mg/dL — ABNORMAL HIGH (ref 70–99)
Potassium: 4.9 mmol/L (ref 3.5–5.2)
Sodium: 132 mmol/L — ABNORMAL LOW (ref 134–144)
eGFR: 72 mL/min/1.73 (ref >59)

## 2023-12-12 ENCOUNTER — Ambulatory Visit: Payer: Self-pay

## 2023-12-14 ENCOUNTER — Other Ambulatory Visit: Payer: Self-pay | Admitting: Cardiology

## 2023-12-14 DIAGNOSIS — I4819 Other persistent atrial fibrillation: Secondary | ICD-10-CM

## 2023-12-14 NOTE — Telephone Encounter (Signed)
 Prescription refill request for Eliquis  received. Indication: AF Last office visit: 12/08/23  JAYSON Fishman PA-C Scr: 1.09 on 12/08/23  Epic Age: 73 Weight: 126.5kg  Based on above findings Eliquis  5mg  twice daily is the appropriate dose.  Refill approved.

## 2023-12-15 ENCOUNTER — Telehealth: Payer: Self-pay | Admitting: *Deleted

## 2023-12-15 NOTE — Telephone Encounter (Signed)
 Scheduled for: R/L heart cath Date: 12/16/23 Time: 10:30 am Arrival Time: 09:30 am Location: [facility] Instructions: Reviewed  Patients daughter verbalized understanding of all instructions.   She stated that she will be out of town and he will be using a ride share bus. Advised that unfortunately he will need to have a person with him and not a ride share. She will call us  back once she can figure out a plan for his transportation stating that they may have to reschedule if unable to find someone. Instructed her to please call back. She had no further questions at this time.

## 2023-12-16 ENCOUNTER — Ambulatory Visit
Admission: RE | Admit: 2023-12-16 | Discharge: 2023-12-16 | Disposition: A | Discharge status: Home / Self Care | Attending: Cardiovascular Disease | Admitting: Cardiovascular Disease

## 2023-12-16 ENCOUNTER — Encounter: Payer: Self-pay | Admitting: Cardiovascular Disease

## 2023-12-16 ENCOUNTER — Encounter
Admission: RE | Disposition: A | Discharge status: Home / Self Care | Payer: Self-pay | Source: Home / Self Care | Attending: Cardiovascular Disease

## 2023-12-16 DIAGNOSIS — I5022 Chronic systolic (congestive) heart failure: Secondary | ICD-10-CM

## 2023-12-16 DIAGNOSIS — Z79899 Other long term (current) drug therapy: Secondary | ICD-10-CM | POA: Insufficient documentation

## 2023-12-16 DIAGNOSIS — I714 Abdominal aortic aneurysm, without rupture, unspecified: Secondary | ICD-10-CM | POA: Insufficient documentation

## 2023-12-16 DIAGNOSIS — Z95810 Presence of automatic (implantable) cardiac defibrillator: Secondary | ICD-10-CM | POA: Diagnosis not present

## 2023-12-16 DIAGNOSIS — Z7901 Long term (current) use of anticoagulants: Secondary | ICD-10-CM | POA: Diagnosis not present

## 2023-12-16 DIAGNOSIS — I428 Other cardiomyopathies: Secondary | ICD-10-CM | POA: Diagnosis not present

## 2023-12-16 DIAGNOSIS — R9439 Abnormal result of other cardiovascular function study: Secondary | ICD-10-CM

## 2023-12-16 DIAGNOSIS — E785 Hyperlipidemia, unspecified: Secondary | ICD-10-CM | POA: Diagnosis not present

## 2023-12-16 DIAGNOSIS — I4821 Permanent atrial fibrillation: Secondary | ICD-10-CM | POA: Insufficient documentation

## 2023-12-16 DIAGNOSIS — E119 Type 2 diabetes mellitus without complications: Secondary | ICD-10-CM | POA: Insufficient documentation

## 2023-12-16 DIAGNOSIS — I110 Hypertensive heart disease with heart failure: Secondary | ICD-10-CM | POA: Insufficient documentation

## 2023-12-16 DIAGNOSIS — F17210 Nicotine dependence, cigarettes, uncomplicated: Secondary | ICD-10-CM | POA: Diagnosis not present

## 2023-12-16 DIAGNOSIS — I272 Pulmonary hypertension, unspecified: Secondary | ICD-10-CM | POA: Insufficient documentation

## 2023-12-16 HISTORY — PX: RIGHT/LEFT HEART CATH AND CORONARY ANGIOGRAPHY: CATH118266

## 2023-12-16 LAB — POCT I-STAT 7, (LYTES, BLD GAS, ICA,H+H)
Acid-base deficit: 1 mmol/L (ref 0.0–2.0)
Acid-base deficit: 2 mmol/L (ref 0.0–2.0)
Bicarbonate: 22.9 mmol/L (ref 20.0–28.0)
Bicarbonate: 25.4 mmol/L (ref 20.0–28.0)
Calcium, Ion: 1.14 mmol/L — ABNORMAL LOW (ref 1.15–1.40)
Calcium, Ion: 1.19 mmol/L (ref 1.15–1.40)
HCT: 38 % — ABNORMAL LOW (ref 39.0–52.0)
HCT: 39 % (ref 39.0–52.0)
Hemoglobin: 12.9 g/dL — ABNORMAL LOW (ref 13.0–17.0)
Hemoglobin: 13.3 g/dL (ref 13.0–17.0)
O2 Saturation: 65 %
O2 Saturation: 95 %
Potassium: 4 mmol/L (ref 3.5–5.1)
Potassium: 4.2 mmol/L (ref 3.5–5.1)
Sodium: 135 mmol/L (ref 135–145)
Sodium: 135 mmol/L (ref 135–145)
TCO2: 24 mmol/L (ref 22–32)
TCO2: 27 mmol/L (ref 22–32)
pCO2 arterial: 39.7 mmHg (ref 32–48)
pCO2 arterial: 46 mmHg (ref 32–48)
pH, Arterial: 7.349 — ABNORMAL LOW (ref 7.35–7.45)
pH, Arterial: 7.37 (ref 7.35–7.45)
pO2, Arterial: 36 mmHg — CL (ref 83–108)
pO2, Arterial: 78 mmHg — ABNORMAL LOW (ref 83–108)

## 2023-12-16 LAB — GLUCOSE, CAPILLARY: Glucose-Capillary: 117 mg/dL — ABNORMAL HIGH (ref 70–99)

## 2023-12-16 SURGERY — RIGHT/LEFT HEART CATH AND CORONARY ANGIOGRAPHY
Anesthesia: Moderate Sedation | Laterality: Bilateral

## 2023-12-16 MED ORDER — HEPARIN (PORCINE) IN NACL 1000-0.9 UT/500ML-% IV SOLN
INTRAVENOUS | Status: AC
  Filled 2023-12-16: qty 1000

## 2023-12-16 MED ORDER — LIDOCAINE HCL (PF) 1 % IJ SOLN
INTRAMUSCULAR | Status: DC | PRN
  Administered 2023-12-16 (×2): 2 mL

## 2023-12-16 MED ORDER — SODIUM CHLORIDE 0.9% FLUSH: 3.0000 mL | Freq: Two times a day (BID) | INTRAVENOUS | Status: DC

## 2023-12-16 MED ORDER — ACETAMINOPHEN 325 MG PO TABS: 650.0000 mg | ORAL_TABLET | ORAL | Status: DC | PRN

## 2023-12-16 MED ORDER — HEPARIN (PORCINE) IN NACL 1000-0.9 UT/500ML-% IV SOLN
INTRAVENOUS | Status: DC | PRN
  Administered 2023-12-16: 1000 mL

## 2023-12-16 MED ORDER — FENTANYL CITRATE (PF) 100 MCG/2ML IJ SOLN
INTRAMUSCULAR | Status: AC
Start: 2023-12-16 — End: 2023-12-16
  Filled 2023-12-16: qty 2

## 2023-12-16 MED ORDER — HEPARIN SODIUM (PORCINE) 1000 UNIT/ML IJ SOLN
INTRAMUSCULAR | Status: AC
Start: 2023-12-16 — End: 2023-12-16
  Filled 2023-12-16: qty 10

## 2023-12-16 MED ORDER — ONDANSETRON HCL 4 MG/2ML IJ SOLN: 4.0000 mg | Freq: Four times a day (QID) | INTRAMUSCULAR | Status: DC | PRN

## 2023-12-16 MED ORDER — MIDAZOLAM HCL 2 MG/2ML IJ SOLN
INTRAMUSCULAR | Status: AC
  Filled 2023-12-16: qty 2

## 2023-12-16 MED ORDER — VERAPAMIL HCL 2.5 MG/ML IV SOLN
INTRAVENOUS | Status: AC
  Filled 2023-12-16: qty 2

## 2023-12-16 MED ORDER — MIDAZOLAM HCL 2 MG/2ML IJ SOLN
INTRAMUSCULAR | Status: DC | PRN
Start: 2023-12-16 — End: 2023-12-16
  Administered 2023-12-16 (×2): 1 mg via INTRAVENOUS

## 2023-12-16 MED ORDER — IOHEXOL 300 MG/ML  SOLN
INTRAMUSCULAR | Status: DC | PRN
  Administered 2023-12-16: 90 mL

## 2023-12-16 MED ORDER — SODIUM CHLORIDE 0.9% FLUSH: 3.0000 mL | INTRAVENOUS | Status: DC | PRN

## 2023-12-16 MED ORDER — SODIUM CHLORIDE 0.9 % IV SOLN: 250.0000 mL | INTRAVENOUS | Status: DC | PRN

## 2023-12-16 MED ORDER — SODIUM CHLORIDE 0.9 % IV SOLN
INTRAVENOUS | Status: DC
  Administered 2023-12-16: 500 mL via INTRAVENOUS

## 2023-12-16 MED ORDER — VERAPAMIL HCL 2.5 MG/ML IV SOLN
INTRAVENOUS | Status: DC | PRN
  Administered 2023-12-16: 2.5 mg via INTRA_ARTERIAL

## 2023-12-16 MED ORDER — ASPIRIN 81 MG PO CHEW
81.0000 mg | CHEWABLE_TABLET | ORAL | Status: AC
  Administered 2023-12-16: 81 mg via ORAL

## 2023-12-16 MED ORDER — LIDOCAINE HCL 1 % IJ SOLN
INTRAMUSCULAR | Status: AC
  Filled 2023-12-16: qty 20

## 2023-12-16 MED ORDER — ASPIRIN 81 MG PO CHEW
CHEWABLE_TABLET | ORAL | Status: AC
  Filled 2023-12-16: qty 1

## 2023-12-16 MED ORDER — FENTANYL CITRATE (PF) 100 MCG/2ML IJ SOLN
INTRAMUSCULAR | Status: DC | PRN
  Administered 2023-12-16 (×2): 25 ug via INTRAVENOUS

## 2023-12-16 MED ORDER — HEPARIN SODIUM (PORCINE) 1000 UNIT/ML IJ SOLN
INTRAMUSCULAR | Status: DC | PRN
  Administered 2023-12-16: 5000 [IU] via INTRAVENOUS

## 2023-12-16 SURGICAL SUPPLY — 12 items
CATH BALLN WEDGE 5F 110CM (CATHETERS) IMPLANT
CATH INFINITI 5F JL4 125CM (CATHETERS) IMPLANT
CATH INFINITI AMBI 5FR JK (CATHETERS) IMPLANT
CATH LAUNCHER 6FR EBU 4 (CATHETERS) IMPLANT
DRAPE BRACHIAL (DRAPES) IMPLANT
GLIDESHEATH SLEND SS 6F .021 (SHEATH) IMPLANT
GUIDEWIRE .025 260CM (WIRE) IMPLANT
GUIDEWIRE INQWIRE 1.5J.035X260 (WIRE) IMPLANT
PACK CARDIAC CATH (CUSTOM PROCEDURE TRAY) ×1 IMPLANT
SET ATX-X65L (MISCELLANEOUS) IMPLANT
SHEATH GLIDE SLENDER 4/5FR (SHEATH) IMPLANT
STATION PROTECTION PRESSURIZED (MISCELLANEOUS) IMPLANT

## 2023-12-16 NOTE — Interval H&P Note (Signed)
 History and Physical Interval Note:  12/16/2023 10:49 AM  Eugene Henderson  has presented today for surgery, with the diagnosis of L and R Cath   Abnormal stress test.  The various methods of treatment have been discussed with the patient and family. After consideration of risks, benefits and other options for treatment, the patient has consented to  Procedure(s): RIGHT/LEFT HEART CATH AND CORONARY ANGIOGRAPHY (Bilateral) as a surgical intervention.  The patient's history has been reviewed, patient examined, no change in status, stable for surgery.  I have reviewed the patient's chart and labs.  Questions were answered to the patient's satisfaction.     [name]

## 2023-12-18 ENCOUNTER — Other Ambulatory Visit: Payer: Self-pay | Admitting: Family Medicine

## 2023-12-19 ENCOUNTER — Encounter: Payer: Self-pay | Admitting: Cardiovascular Disease

## 2023-12-20 NOTE — Telephone Encounter (Signed)
 Refused metformin  500 mg because it was sent to [facility] [place] on 11/09/2023.

## 2023-12-21 ENCOUNTER — Other Ambulatory Visit: Payer: Self-pay | Admitting: Family Medicine

## 2023-12-22 NOTE — Telephone Encounter (Signed)
 Requested Prescriptions  Refused Prescriptions Disp Refills   atorvastatin  (LIPITOR) 40 MG tablet [Pharmacy Med Name: ATORVASTATIN  40 MG TABLET] 90 tablet 1    Sig: TAKE 1 TABLET BY MOUTH EVERY DAY     Cardiovascular:  Antilipid - Statins Failed - 12/22/2023  1:25 PM      Failed - Lipid Panel in normal range within the last 12 months    Cholesterol, Total  Date Value Ref Range Status  07/13/2023 88 (L) 100 - 199 mg/dL Final   LDL Chol Calc (NIH)  Date Value Ref Range Status  07/13/2023 23 0 - 99 mg/dL Final   HDL  Date Value Ref Range Status  07/13/2023 47 >39 mg/dL Final   Triglycerides  Date Value Ref Range Status  07/13/2023 90 0 - 149 mg/dL Final         Passed - Patient is not pregnant      Passed - Valid encounter within last 12 months    Recent Outpatient Visits           1 month ago Diabetes mellitus with cardiac complication [facility])   [facility], Megan P, DO   4 months ago Acute cystitis with hematuria   [place] [place], Megan P, DO   5 months ago Diarrhea, unspecified type   [place] [facility] [place], Duwaine SQUIBB, DO       Future Appointments             In 1 week Furth, Cadence H, PA-C [facility] [place]

## 2023-12-22 NOTE — Progress Notes (Signed)
 Remote ICD transmission.

## 2023-12-28 ENCOUNTER — Ambulatory Visit: Payer: Self-pay

## 2023-12-28 NOTE — Telephone Encounter (Signed)
 FYI Only or Action Required?: Action required by provider: would like order for cxr and antibiotic; also states he is losing weight, 6 pounds.  Home health nurse is asking and they can have mobile unit come for the cxr.  Patient was last seen in primary care on 11/09/2023 by Vicci Duwaine SQUIBB, DO.  Called Nurse Triage reporting Wheezing.  Symptoms began today.  Interventions attempted: Nothing.  Symptoms are: stable.  Triage Disposition: No disposition on file.  Patient/caregiver understands and will follow disposition?:         Copied from CRM [id number]. Topic: Clinical - Red Word Triage >> Dec 28, 2023  1:05 PM Turkey B wrote: [facility] that prompted transfer to Nurse Triage: nurse Lorenza from [facility], states pt has wheezing in upper loads and coughing up yellow sputum Reason for Disposition  New-onset mild wheezing  Answer Assessment - Initial Assessment Questions 1. ONSET: When did the wheezing begin?      today 2. RESPIRATORY STATUS: Describe your child's breathing. What does it sound like? (e.g., wheezing, stridor, grunting, weak cry, unable to speak, retractions, rapid rate, cyanosis)     States wheezing, last week no wheezing 3. FEEDING STATUS:  Is your child having difficulty with breast or bottle feeding?  If so, ask:  How long can he feed without stopping to take a breath?     na 4. ASSOCIATED VIRAL INFECTION: Does your child also have a cold, cough or fever?      Productive cough with yellow sputum and tan 5. ASSOCIATED ALLERGIES: Does your child also have any allergies?      na 6. RECURRENT EPISODES: Has your child had other attacks of wheezing? If so, ask: When was the last time? and What happened that time?      Na; adult 7. FAMILY HISTORY: Does anyone in your family have asthma?      na 8. CHILD'S APPEARANCE: How sick is your child acting?  What is he doing right now? If asleep, ask: How was he acting before he went to sleep?      na  Protocols used: Wheezing - Other Than Asthma-P-AH

## 2023-12-29 NOTE — Telephone Encounter (Signed)
 Next appointment with provider is 9/4

## 2023-12-29 NOTE — Telephone Encounter (Signed)
 Returned call to [employer]. She did explain the below update.  Wheezing to upper bilateral lungs, coughing up yellow sputum as of yesterday. Reports today a phone visit was completed by another RN and during that call patient reported he was feeling slightly better after having taking Mucinex . I advised I would forwarded back to provider for additional review to see if she would like to still proceed. I have confirmed if decision for mobile imaging is made she only needs a verbal order and they will contact the vendor. Additionally would like to know if antibiotics are needed if they can be sent to patients pharmacy.   Imaging would then be scheduled with [facility].

## 2023-12-29 NOTE — Telephone Encounter (Signed)
 Would need virtual appt for abx, but we can get x-ray ordered.

## 2023-12-29 NOTE — Telephone Encounter (Signed)
 Yes OK for verbal orders

## 2023-12-30 ENCOUNTER — Encounter: Payer: Self-pay | Admitting: Family Medicine

## 2023-12-30 ENCOUNTER — Encounter: Payer: Self-pay | Admitting: Medical

## 2023-12-30 ENCOUNTER — Telehealth: Admitting: Family Medicine

## 2023-12-30 ENCOUNTER — Ambulatory Visit: Attending: Medical | Admitting: Medical

## 2023-12-30 ENCOUNTER — Telehealth: Payer: Self-pay | Admitting: Cardiovascular Disease

## 2023-12-30 VITALS — BP 127/83 | HR 82 | Ht 73.0 in | Wt 274.6 lb

## 2023-12-30 DIAGNOSIS — I428 Other cardiomyopathies: Secondary | ICD-10-CM | POA: Diagnosis not present

## 2023-12-30 DIAGNOSIS — R0602 Shortness of breath: Secondary | ICD-10-CM | POA: Diagnosis not present

## 2023-12-30 DIAGNOSIS — Z01810 Encounter for preprocedural cardiovascular examination: Secondary | ICD-10-CM

## 2023-12-30 DIAGNOSIS — Z720 Tobacco use: Secondary | ICD-10-CM

## 2023-12-30 DIAGNOSIS — E782 Mixed hyperlipidemia: Secondary | ICD-10-CM

## 2023-12-30 DIAGNOSIS — I4821 Permanent atrial fibrillation: Secondary | ICD-10-CM

## 2023-12-30 DIAGNOSIS — R9439 Abnormal result of other cardiovascular function study: Secondary | ICD-10-CM

## 2023-12-30 DIAGNOSIS — R062 Wheezing: Secondary | ICD-10-CM

## 2023-12-30 DIAGNOSIS — I5022 Chronic systolic (congestive) heart failure: Secondary | ICD-10-CM

## 2023-12-30 DIAGNOSIS — F1010 Alcohol abuse, uncomplicated: Secondary | ICD-10-CM

## 2023-12-30 DIAGNOSIS — I10 Essential (primary) hypertension: Secondary | ICD-10-CM

## 2023-12-30 DIAGNOSIS — Z95810 Presence of automatic (implantable) cardiac defibrillator: Secondary | ICD-10-CM

## 2023-12-30 MED ORDER — PREDNISONE 50 MG PO TABS: 50.0000 mg | ORAL_TABLET | Freq: Every day | ORAL | 0 refills | Status: DC

## 2023-12-30 NOTE — Progress Notes (Signed)
 Cardiology Office Note   Date:  12/30/2023  ID:  Eugene Henderson, DOB 1950-06-30, MRN [id number] PCP: Louder Duwaine SQUIBB, DO  [facility] Providers Cardiologist:  Deatrice Cage, MD Electrophysiologist:  OLE ONEIDA HOLTS, MD   History of Present Illness Eugene Henderson is a 73 y.o. male with a h/o NICM based on cath in 2018, HTN, DM2, HFrEF, permanent Afib, tobacco use and alcohol use who presents for follow up for LHC.   He was admitted 06/14/22 with SOB and chest pain. He was noted to be in afib RVR and underwent DCCV to NSR. 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 Echo 06/2022 showed LVEF 20-25% with severe RV dysfunction. Cardiac MRI 2/24 showed LVEF 17%, RVEF 36%, small area of lateral wall subendocardial LGE, mid-wall LGE at this inferoseptal RV insertion site. There was a very small area of possible prior MI, but this study suggested primarily NICM.    RHC 09/2022 showed normal filling pressures with mildly decreased CI at 2.2. he failed DCCV on amiodarone  09/2022. Biotronik ICD was placed 10/2022.    He has been following with heart failure and EP. He saw Dr. HOLTS 08/2023 reporting chest pain. PET stress was ordered which was abnormal partially reversible location. EF 30-35%. He was asked to come in to discuss heart cath. R?L heart cath 12/16/23 showed minor irregularities with no evidence of CAD. RHC showed moderately elevated right atrial pressure, moderate pulmonary HTN and normal CO.  Today, the patient is overall doing OK. He says he recently had Flu-like symptoms, but is better now. He is also needing Pre-op for dental extraction of 9 teeth, needing medical clearance. Medications that need to be held are determined by the surgeon. He denies chest pain, but has minor SOB when he exerts himself. He denies lower leg edema, orthopnea or pnd. He is unable to walk  due to his knee.  He is still drinking alcohol, 8 beers weekly. He cut smoking down to 3 cigarettes weekly.  Studies Reviewed EKG Interpretation Date/Time:  Friday December 30 2023 14:42:11 EDT Ventricular Rate:  82 PR Interval:    QRS Duration:  102 QT Interval:  380 QTC Calculation: 443 R Axis:   -51  Text Interpretation: Atrial fibrillation Left anterior fascicular block Cannot rule out Anterior infarct , age undetermined When compared with ECG of 08-Dec-2023 15:05, No significant change was found Confirmed by Franchester, Welda Azzarello ([id number]) on 12/30/2023 2:43:39 PM    R/L heart cath 12/2023 1.  Minor irregularities with no evidence of obstructive coronary artery disease. 2.  Left ventricular angiography was not performed.  EF is known to be moderately to severely reduced. 3.  Right heart catheterization showed moderately elevated right atrial pressure, moderate pulmonary hypertension and normal cardiac output.   RA: 14 mmHg RV: 49/11 mmHg PW: 15 mmHg PA: 51/23 with a mean of 35 mmHg Cardiac output is 6.24 with an index of 2.51.   Recommendations: The patient has nonischemic cardiomyopathy.  Recommend medical therapy. Avoid catheterization via the right radial artery in the future due to tortuous innominate artery and significantly dilated aortic root.  Engaging the left main coronary artery was very challenging.    Cardiac PET stress test 11/2023   Findings are consistent with small area of inferoapical infarction with peri-infarct ischemia. The study is intermediate risk due to reduced EF - consider correlation with echo.   ECG  rhythm shows atrial fibrillation which may affect accuracy of gated data such as volumes and EF.   No ST deviation was noted.   LV perfusion is abnormal. Defect 1: There is a small defect with mild reduction in uptake present in the apical to mid inferior location(s) that is partially reversible. There is abnormal wall motion in the defect area. Consistent with  infarction and peri-infarct ischemia.   Rest left ventricular function is abnormal. Rest global function is moderately reduced. Rest EF: 30%. Stress left ventricular function is abnormal. Stress global function is moderately reduced. Stress EF: 36%. End diastolic cavity size is severely enlarged. End systolic cavity size is severely enlarged. No evidence of transient ischemic dilation (TID) noted.   Myocardial blood flow was computed to be 0.1ml/g/min at rest and 1.47ml/g/min at stress. Global myocardial blood flow reserve was 2.04 and was normal.   Coronary calcium  was present on the attenuation correction CT images. Mild coronary calcifications were present. Coronary calcifications were present in the left anterior descending artery, left circumflex artery and right coronary artery distribution(s).  RHC 09/2022 1. Mildly elevated RA pressure, normal PCWP.  2. Mild pulmonary venous hypertension.  3. Mildly decreased cardiac output (CI 2.22), preserved PAPi.    No changes to medication regimen at this time.     CMRI 09/2022 IMPRESSION: 1.  Severely reduced LV function, LVEF 17%.   2. Small lateral wall subendocardial LGE (scar) indicating possible prior infarct.   3. Mid wall LGE (scar) in the LV basal-mid septal wall at RV insertion points suggesting non ischemic etiology.   4.  No diffuse myocardial scar or infiltrative disease noted.   5.  Moderately dilated ascending aorta, 47 mm in diameter.   6.  Moderately reduced RV function.   7.  Findings suggest non ischemic cardiomyopathy.   Echo TEE 06/2022 1. Left ventricular ejection fraction, by estimation, is 20 to 25%. The  left ventricle has severely decreased function. The left ventricle  demonstrates global hypokinesis. The left ventricular internal cavity size  was mildly dilated. Left ventricular  diastolic function could not be evaluated.   2. Right ventricular systolic function is moderately reduced. The right  ventricular  size is moderately enlarged. Mildly increased right  ventricular wall thickness.   3. Left atrial size was moderately dilated. No left atrial/left atrial  appendage thrombus was detected.   4. Right atrial size was severely dilated.   5. There is a very small fibrinous echodensity on the atrial side of the  mitral valve measuring 0.9cm; this is unlikely to represent endocarditis.  . The mitral valve is normal in structure. Moderate to severe mitral valve  regurgitation. No evidence of  mitral stenosis.   6. The aortic valve is grossly normal. Aortic valve regurgitation is  mild.    Echo 06/2022 1. Left ventricular ejection fraction, by estimation, is 20 to 25%. The  left ventricle has severely decreased function. The left ventricle  demonstrates global hypokinesis. The left ventricular internal cavity size  was severely dilated. There is mild  left ventricular hypertrophy. Left ventricular diastolic function could  not be evaluated.   2. Right ventricular systolic function is severely reduced. The right  ventricular size is mildly enlarged. There is moderately elevated  pulmonary artery systolic pressure.   3. Left atrial size was severely dilated.   4. Right atrial size was severely dilated.   5. The mitral valve is normal in structure. Moderate mitral valve  regurgitation.   6. Tricuspid valve  regurgitation is mild to moderate.   7. The aortic valve is tricuspid. Aortic valve regurgitation is trivial.  No aortic stenosis is present.   8. Aortic dilatation noted. 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 There is severe dilatation of the ascending aorta,  measuring 50 mm.   9. The inferior vena cava is dilated in size with <50% respiratory  variability, suggesting right atrial pressure of 15 mmHg.    Echo 03/2021 1. Left ventricular ejection fraction, by estimation, is 30%. The left  ventricle has severely decreased function. The left ventricle  demonstrates  global hypokinesis. The left ventricular internal cavity size was mildly  dilated. There is mild left  ventricular hypertrophy. Left ventricular diastolic parameters are  indeterminate.   2. Right ventricular systolic function is low normal. The right  ventricular size is normal.   3. Left atrial size was severely dilated.   4. Right atrial size was severely dilated.   5. The mitral valve is normal in structure. Mild mitral valve  regurgitation.   6. The aortic valve was not well visualized. Aortic valve regurgitation  is not visualized.   7. Aortic dilatation noted. 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      R/L heart cath 2018 1. No significant coronary artery disease. 2. Severely reduced LV systolic function by echo. Left ventricular angiography was not performed.  3. Right heart catheterization showed minimally elevated filling pressures and pulmonary hypertension. Moderately reduced cardiac output. RA pressure: 12 mmHg, RV pressure: 40 over 6 mmHg, PA pressure 37/12 with a mean of 12 mmHg. Pulmonary capillary wedge pressure was 12. Cardiac output was 4.16 with a cardiac index of 1.77.   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 His volume status appears to be close to normal. Thus, I switched furosemide  to 40 mg by mouth twice daily. Ventricular rate is still not optimally controlled and thus I increased Toprol  to 200 mg once daily. Start long-term anticoagulation with Eliquis  5 mg twice daily starting tomorrow.  The patient was noted to have apnea episodes during cardiac catheterization. Recommend outpatient evaluation for sleep apnea. Avoid catheterization via the right radial artery in the future due to tortuous innominate artery and significantly dilated aortic root.  Physical Exam VS:  BP 127/83 (BP Location: Left Arm, Patient Position: Sitting, Cuff Size: Normal)    Pulse 82   Ht 6' 1 (1.854 m)   Wt 274 lb 9.6 oz (124.6 kg)   SpO2 99%   BMI 36.23 kg/m        Wt Readings from Last 3 Encounters:  12/30/23 274 lb 9.6 oz (124.6 kg)  12/16/23 282 lb (127.9 kg)  12/08/23 278 lb 12.8 oz (126.5 kg)    GEN: Well nourished, well developed in no acute distress NECK: No JVD; No carotid bruits CARDIAC: RRR, no murmurs, rubs, gallops RESPIRATORY:  Clear to auscultation without rales, wheezing or rhonchi  ABDOMEN: Soft, non-tender, non-distended EXTREMITIES:  No edema; No deformity   ASSESSMENT AND PLAN  Abnormal stress test Recent stress test was abnormal (report above). Subsequent LHC showed minor irregularities, no obstructive disease, RCH with moderately elevated right atrial pressure, moderate pulmonary HTN and normal CO. Cath site is stable. Patient denies chest pain. He has chronic SOB on exertion. No ASA given Eliquis . Continue Lipitor, Toprol  and SL NTG  HTN BP today is normal. Continue Current medications.   HLD LDL  23. Continue Lipitor 40mg  daily.   Chronic systolic heart failure NICM s/p ICD LHC confirmed NICM, moderately elevated right atrial pressures. He reports chronic DOE. Patient is euvolemic. Continue toprol  150mg  daily, spironolactone  25mg  daily, Entresto  49-51mg BID, Jardiance  10mg  daily, digoxin  0.125mg  daily, and lasix  40mg  daily.   Alcohol use He reports he is drinking 6-8 beers weekly.   Tobacco use  He is smoking 3 cigarettes weekly   Permanent Afib EKG shows rate controlled Afib. Continue Eliquis  5mg BID for stroke ppx.  Pre-op cardiac evaluation The patient is needing 9 teeth removed, no date has been set. The patient is overall stable from a cardiac perspective. METS>4. No further cardiac testing prior to procedure.   RCRI 1.1% risk of MACE Refer to surgeon for medications that need to be held.         Dispo: follow-up in 3 months  Signed, Analyse Angst VEAR Fishman, PA-C

## 2023-12-30 NOTE — Patient Instructions (Signed)
 Medication Instructions:  Your physician recommends that you continue on your current medications as directed. Please refer to the Current Medication list given to you today.    *If you need a refill on your cardiac medications before your next appointment, please call your pharmacy*  Lab Work: No labs ordered today    Testing/Procedures: No test ordered today   Follow-Up: At [facility], you and your health needs are our priority.  As part of our continuing mission to provide you with exceptional heart care, our providers are all part of one team.  This team includes your primary Cardiologist (physician) and Advanced Practice Providers or APPs (Physician Assistants and Nurse Practitioners) who all work together to provide you with the care you need, when you need it.  Your next appointment:   3 month(s)  Provider:   You may see Deatrice Cage, MD or one of the following Advanced Practice Providers on your designated Care Team:   Cadence [place], PA-C

## 2023-12-30 NOTE — Telephone Encounter (Signed)
 Pharmacy please advise on holding Eliquis  prior to extraction of 9 teeth scheduled for TBD. Thank you.

## 2023-12-30 NOTE — Telephone Encounter (Signed)
 Please reach out to patient and offer virtual appointment.   I will call back RN with home health and provide verbal for chest xray order.   Thank you!

## 2023-12-30 NOTE — Telephone Encounter (Signed)
   Patient Name: Eugene Henderson  DOB: 08-09-1950 MRN: [id number]  Primary Cardiologist: Deatrice Cage, MD  Chart reviewed as part of pre-operative protocol coverage. Given past medical history and time since last visit, based on ACC/AHA guidelines, Gurdeep Keesey is at acceptable risk for the planned procedure without further cardiovascular testing.   Per Alberta Fishman, PA 12/30/2023 Pre-op cardiac evaluation The patient is needing 9 teeth removed, no dat has been set. The patient is overall stable from a cardiac perspective. METS>4. No further cardiac testing prior to procedure.   RCRI 1.1% risk of MACE Refer to surgeon for medications that need to be held.   The patient was advised that if he develops new symptoms prior to surgery to contact our office to arrange for a follow-up visit, and he verbalized understanding.  I will route this recommendation to the requesting party via Epic fax function and remove from pre-op pool.  Please call with questions.  Lamarr Satterfield, NP 12/30/2023, 3:47 PM

## 2023-12-30 NOTE — Progress Notes (Signed)
 There were no vitals taken for this visit.   Subjective:    Patient ID: Dion Sibal, male    DOB: 1951/02/25, 73 y.o.   MRN: [id number]  HPI: Maurilio Puryear is a 73 y.o. male  Chief Complaint  Patient presents with   Wheezing    Pt states that onset about a week.    Shortness of Breath   Cough   UPPER RESPIRATORY TRACT INFECTION Duration: about a week Worst symptom: wheezing Fever: no Cough: yes Shortness of breath: yes Wheezing: yes Chest pain: no Chest tightness: yes Chest congestion: yes Nasal congestion: no Runny nose: no Post nasal drip: no Sneezing: yes Sore throat: no Swollen glands: no Sinus pressure: no Headache: no Face pain: no Toothache: no Ear pain: no  Ear pressure: no  Eyes red/itching:no Eye drainage/crusting: no  Vomiting: no Rash: no Fatigue: no Sick contacts: no Strep contacts: no  Context: better Recurrent sinusitis: no Relief with OTC cold/cough medications: no  Treatments attempted: none   Relevant past medical, surgical, family and social history reviewed and updated as indicated. Interim medical history since our last visit reviewed. Allergies and medications reviewed and updated.  Review of Systems  Constitutional: Negative.   HENT:  Positive for congestion, rhinorrhea and sneezing. Negative for dental problem, drooling, ear discharge, ear pain, facial swelling, hearing loss, mouth sores, nosebleeds, postnasal drip, sinus pressure, sinus pain, sore throat, tinnitus, trouble swallowing and voice change.   Respiratory:  Positive for cough and wheezing. Negative for apnea, choking, chest tightness, shortness of breath and stridor.   Cardiovascular: Negative.   Musculoskeletal: Negative.   Psychiatric/Behavioral: Negative.      Per HPI unless specifically indicated above     Objective:    There were no vitals taken for this visit.  Wt Readings from Last 3 Encounters:  12/16/23 282 lb (127.9 kg)  12/08/23 278 lb 12.8 oz  (126.5 kg)  11/09/23 287 lb (130.2 kg)    Physical Exam Constitutional:      General: He is not in acute distress.    Appearance: Normal appearance. He is well-developed. He is obese. He is not ill-appearing, toxic-appearing or diaphoretic.  HENT:     Head: Normocephalic and atraumatic.     Right Ear: Hearing and external ear normal.     Left Ear: Hearing and external ear normal.     Nose: Nose normal.  Eyes:     General: Lids are normal. No scleral icterus.       Right eye: No discharge.        Left eye: No discharge.     Extraocular Movements: Extraocular movements intact.     Conjunctiva/sclera: Conjunctivae normal.     Pupils: Pupils are equal, round, and reactive to light.  Pulmonary:     Effort: Pulmonary effort is normal. No respiratory distress.  Musculoskeletal:        General: Normal range of motion.     Cervical back: Normal range of motion.  Skin:    Coloration: Skin is not jaundiced or pale.     Findings: No bruising, erythema, lesion or rash.  Neurological:     General: No focal deficit present.     Mental Status: He is alert and oriented to person, place, and time.  Psychiatric:        Mood and Affect: Mood normal.        Speech: Speech normal.        Behavior: Behavior normal.  Thought Content: Thought content normal.        Judgment: Judgment normal.     Results for orders placed or performed during the hospital encounter of 12/16/23  Glucose, capillary   Collection Time: 12/16/23 10:23 AM  Result Value Ref Range   Glucose-Capillary 117 (H) 70 - 99 mg/dL  I-STAT 7, (LYTES, BLD GAS, ICA, H+H)   Collection Time: 12/16/23 11:10 AM  Result Value Ref Range   pH, Arterial 7.370 7.35 - 7.45   pCO2 arterial 39.7 32 - 48 mmHg   pO2, Arterial 78 (L) 83 - 108 mmHg   Bicarbonate 22.9 20.0 - 28.0 mmol/L   TCO2 24 22 - 32 mmol/L   O2 Saturation 95 %   Acid-base deficit 2.0 0.0 - 2.0 mmol/L   Sodium 135 135 - 145 mmol/L   Potassium 4.0 3.5 - 5.1 mmol/L    Calcium , Ion 1.14 (L) 1.15 - 1.40 mmol/L   HCT 38.0 (L) 39.0 - 52.0 %   Hemoglobin 12.9 (L) 13.0 - 17.0 g/dL   Sample type ARTERIAL   I-STAT 7, (LYTES, BLD GAS, ICA, H+H)   Collection Time: 12/16/23 11:15 AM  Result Value Ref Range   pH, Arterial 7.349 (L) 7.35 - 7.45   pCO2 arterial 46.0 32 - 48 mmHg   pO2, Arterial 36 (LL) 83 - 108 mmHg   Bicarbonate 25.4 20.0 - 28.0 mmol/L   TCO2 27 22 - 32 mmol/L   O2 Saturation 65 %   Acid-base deficit 1.0 0.0 - 2.0 mmol/L   Sodium 135 135 - 145 mmol/L   Potassium 4.2 3.5 - 5.1 mmol/L   Calcium , Ion 1.19 1.15 - 1.40 mmol/L   HCT 39.0 39.0 - 52.0 %   Hemoglobin 13.3 13.0 - 17.0 g/dL   Sample type ARTERIAL    Comment NOTIFIED PHYSICIAN       Assessment & Plan:   Problem List Items Addressed This Visit   None Visit Diagnoses       Wheezing    -  Primary   To have CXR through home health. Will treat with prednisone  burst for now. Will send abx if pneumonia on x-ray. Await results. Treat as needed.        Follow up plan: Return if symptoms worsen or fail to improve.   This visit was completed via video visit through MyChart due to the restrictions of the COVID-19 pandemic. All issues as above were discussed and addressed. Physical exam was done as above through visual confirmation on video through MyChart. If it was felt that the patient should be evaluated in the office, they were directed there. The patient verbally consented to this visit. Location of the patient: home Location of the provider: work Those involved with this call:  Provider: Duwaine Louder, DO CMA: York Fogo, CMA, Front Desk/Registration: Claretta Maiden  Time spent on call: 15 minutes with patient face to face via video conference. More than 50% of this time was spent in counseling and coordination of care. 23 minutes total spent in review of patient's record and preparation of their chart.

## 2023-12-30 NOTE — Telephone Encounter (Signed)
 Eugene Henderson aware that she can proceed with 2 view chest xray.

## 2023-12-30 NOTE — Telephone Encounter (Signed)
   Pre-operative Risk Assessment    Patient Name: Eugene Henderson  DOB: 27-Feb-1951 MRN: [id number]   Date of last office visit: 12/30/23 Date of next office visit: n/a  Request for Surgical Clearance    Procedure:  Dental Extraction - Amount of Teeth to be Pulled:  9   Date of Surgery:  Clearance TBD                                Surgeon:  Dr Comer Molt, DDS Surgeon's Group or Practice Name:  [facility] Phone number:  [phone] Fax number:  [phone]   Type of Clearance Requested:   - Medical    Type of Anesthesia:  4% articaine 1:100,000 with epinephrine   Additional requests/questions:  Please advise surgeon/provider what medications should be held.  SignedRosina Stamps   12/30/2023, 2:17 PM

## 2024-01-03 ENCOUNTER — Encounter: Payer: Self-pay | Admitting: Family Medicine

## 2024-01-03 ENCOUNTER — Ambulatory Visit: Payer: Self-pay | Admitting: Family Medicine

## 2024-01-03 ENCOUNTER — Telehealth: Payer: Self-pay

## 2024-01-03 NOTE — Telephone Encounter (Signed)
 Patient with diagnosis of afib on Eliquis  for anticoagulation.    Procedure: Dental Extraction - Amount of Teeth to be Pulled:  9  Date of procedure: TBD   CHA2DS2-VASc Score = 5   This indicates a 7.2% annual risk of stroke. The patient's score is based upon: CHF History: 1 HTN History: 1 Diabetes History: 1 Stroke History: 0 Vascular Disease History: 1 Age Score: 1 Gender Score: 0      CrCl 84 ml/min Platelet count 176  Patient has not had an Afib/aflutter ablation within the last 3 months or DCCV within the last 30 days  Patient does NOT require pre-op antibiotics for dental procedure.  Per office protocol, patient can hold Eliquis  for 1 day prior to procedure.    **This guidance is not considered finalized until pre-operative APP has relayed final recommendations.**

## 2024-01-03 NOTE — Telephone Encounter (Signed)
 Returned call to patients daughter S[facility] verified). She confirms patient is still complaining of pain and burning with urination that started yesterday. Home health is aware that we can not provide orders without patient being seen and daughter is now aware as well. We discussed next available appointment being tomorrow morning however daughter is not able to bring patient to appointment. He has been scheduled for Nichole Molly acute visit tomorrow 01/04/2024 however I have advised that I will attempt to schedule patient with our office tomorrow if possible.

## 2024-01-03 NOTE — Telephone Encounter (Signed)
   Name: Eugene Henderson  DOB: [birth year]  MRN: [id number]   Primary Cardiologist: Deatrice Cage, MD  Chart reviewed as part of pre-operative protocol coverage. Eugene Henderson was last seen on 12/30/2023 by Cadence Furth, PA-C.  Per Cadence,  pre-op cardiac evaluation The patient is needing 9 teeth removed, no dat has been set. The patient is overall stable from a cardiac perspective. METS>4. No further cardiac testing prior to procedure.   RCRI 1.1% risk of MACE  Per Pharm D, patient has not had an Afib/aflutter ablation within the last 3 months or DCCV within the last 30 days. Patient may hold Eliquis  for 1 day prior to procedure.  Patient does NOT require pre-op antibiotics for dental procedure.   I will route this recommendation to the requesting party via Epic fax function and remove from pre-op pool. Please call with questions.  Barnie Hila, NP 01/03/2024, 4:02 PM

## 2024-01-03 NOTE — Telephone Encounter (Signed)
 Copied from CRM #[id number]. Topic: Clinical - Request for Lab/Test Order >> Jan 02, 2024 12:44 PM Myrick T wrote: Reason for CRM: Skippy from [facility] [phone] called for verbal order for a urine specimen as patient has a possible UTI. Please f/u with Skippy

## 2024-01-04 ENCOUNTER — Ambulatory Visit (INDEPENDENT_AMBULATORY_CARE_PROVIDER_SITE_OTHER)

## 2024-01-04 VITALS — BP 128/82 | HR 74 | Ht 73.0 in | Wt 280.4 lb

## 2024-01-04 DIAGNOSIS — R300 Dysuria: Secondary | ICD-10-CM

## 2024-01-04 DIAGNOSIS — N390 Urinary tract infection, site not specified: Secondary | ICD-10-CM | POA: Insufficient documentation

## 2024-01-04 DIAGNOSIS — N3001 Acute cystitis with hematuria: Secondary | ICD-10-CM

## 2024-01-04 LAB — POCT URINE DIPSTICK
Bilirubin, UA: NEGATIVE
Glucose, UA: 100 mg/dL — AB
Ketones, POC UA: NEGATIVE mg/dL
Nitrite, UA: POSITIVE — AB
POC PROTEIN,UA: NEGATIVE
Spec Grav, UA: 1.01 (ref 1.010–1.025)
Urobilinogen, UA: 0.2 U/dL
pH, UA: 6 (ref 5.0–8.0)

## 2024-01-04 MED ORDER — CEPHALEXIN 250 MG PO CAPS: 250.0000 mg | ORAL_CAPSULE | Freq: Four times a day (QID) | ORAL | 0 refills | Status: AC

## 2024-01-04 NOTE — Progress Notes (Signed)
 Acute Patient Visit  Physician: Nehal Witting A Brydon Spahr, MD  Patient: Eugene Henderson MRN: [id number] DOB: 10-05-1950 PCP: Louder Duwaine SQUIBB, DO     Subjective:   Chief Complaint  Patient presents with   Dysuria   Diarrhea     HPI:   Discussed the use of AI scribe software for clinical note transcription with the patient, who gave verbal consent to proceed.  History of Present Illness   [name] is a 73 year old male who presents with recurrent urinary tract infections.  Lower urinary tract symptoms - Recurrent urinary tract symptoms consistent with urinary tract infection - Sensation of straining and pressure during urination - Significant pain at the beginning of urination, - Increased frequency of urination - Symptoms present for approximately two weeks, with the last week and three days being particularly severe - No fever, chills, nausea, or vomiting  History of urinary tract infections - Third occurrence of urinary tract infection - First UTI treated around June 25th with ciprofloxacin  for Klebsiella erogenes  Medication use and modifications - Temporarily stopped morning dose of furosemide  due to pain associated with frequent urination - Current medications include blood pressure medication and furosemide  - No known allergies to antibiotics  Chronic musculoskeletal pain - Chronic back and knee pain, longstanding and not related to current urinary symptoms          ROS:   As noted in the HPI    ASSESMENT/PLAN:  Encounter Diagnoses  Name Primary?   Dysuria Yes   Acute cystitis with hematuria     Orders Placed This Encounter  Procedures   Urine Culture   POCT URINE DIPSTICK    Assessment and Plan    Recurrent Urinary Tract Infection (UTI)  Third UTI in a short period, possibly due to incomplete bladder emptying. Previous infections caused by Klebsiella erogenes, resistant to amoxicillin , clavulanate intermediate susceptibility to  nitrofurantoin. - Send urine sample for culture. - Prescribe trimethoprim -sulfamethoxazole  (Bactrim ). - Advise to complete the full course of antibiotics. - Instruct to restart furosemide . - Advise to follow up with primary care physician for further evaluation and management of recurrent UTIs.   OBJECTIVE: Vitals:   01/04/24 1541  BP: 128/82  Pulse: 74  SpO2: 98%  Weight: 280 lb 6.4 oz (127.2 kg)  Height: 6' 1 (1.854 m)    Body mass index is 36.99 kg/m.   Physical Exam Vitals reviewed.  Constitutional:      Appearance: Normal appearance. Well-developed with normal weight.  Cardiovascular:     Rate and Rhythm: Normal rate and regular rhythm. Normal heart sounds. Normal peripheral pulses Pulmonary:     Normal breath sounds with normal effort Skin:    General: Skin is warm and dry without noticeable rash. Neurological:     General: No focal deficit present.  Psychiatric:        Mood and Affect: Mood, behavior and cognition normal    Abd:  No CVA, suprapubic tenderness   Allergies Patient has no known allergies.  Past Medical History Patient  has a past medical history of Arthritis, Ascending aortic aneurysm (HCC), Asthma, Chronic systolic CHF (congestive heart failure) (HCC), Diverticulitis, Diverticulitis of large intestine with perforation without abscess or bleeding (05/13/2017), Hypertension, Hyperthyroidism, NICM (nonischemic cardiomyopathy) (HCC), Noncompliance, and Persistent atrial fibrillation (HCC).  Surgical History Patient  has a past surgical history that includes RIGHT/LEFT HEART CATH AND CORONARY ANGIOGRAPHY (N/A, 08/02/2016); Joint replacement (Right); Testicle surgery; Colonoscopy (N/A, 08/27/2019); ENDOVASCULAR REPAIR/STENT GRAFT (N/A, 06/17/2022); TEE  without cardioversion (N/A, 06/23/2022); Cardioversion (N/A, 06/23/2022); Cardioversion (N/A, 06/25/2022); RIGHT HEART CATH (Right, 09/21/2022); Cardioversion (N/A, 09/29/2022); ICD IMPLANT (N/A, 11/03/2022); and  RIGHT/LEFT HEART CATH AND CORONARY ANGIOGRAPHY (Bilateral, 12/16/2023).  Family History Pateint's family history includes Diabetes in his brother, father, and mother; Heart attack in his brother; Heart disease in his brother; Kidney failure in his brother.  Social History Patient  reports that he has been smoking pipe and cigarettes. He has never used smokeless tobacco. He reports current alcohol use of about 2.0 standard drinks of alcohol per week. He reports that he does not use drugs.    01/04/2024

## 2024-01-06 LAB — URINE CULTURE
MICRO NUMBER:: 16766707
SPECIMEN QUALITY:: ADEQUATE

## 2024-01-15 ENCOUNTER — Other Ambulatory Visit: Payer: Self-pay | Admitting: Family Medicine

## 2024-01-17 ENCOUNTER — Other Ambulatory Visit: Payer: Self-pay | Admitting: Family Medicine

## 2024-01-18 NOTE — Telephone Encounter (Signed)
 Requested medication (s) are due for refill today: Yes  Requested medication (s) are on the active medication list: Yes  Last refill:  08/16/23  Future visit scheduled: Yes  Notes to clinic:  Last refilled by Dr. Rolan.    Requested Prescriptions  Pending Prescriptions Disp Refills   JARDIANCE  10 MG TABS tablet [Pharmacy Med Name: JARDIANCE  10 MG Oral Tablet] 90 tablet 3    Sig: TAKE 1 TABLET EVERY DAY     Endocrinology:  Diabetes - SGLT2 Inhibitors Passed - 01/18/2024  3:16 PM      Passed - Cr in normal range and within 360 days    Creatinine, Ser  Date Value Ref Range Status  12/08/2023 1.09 0.76 - 1.27 mg/dL Final         Passed - HBA1C is between 0 and 7.9 and within 180 days    HB A1C (BAYER DCA - WAIVED)  Date Value Ref Range Status  11/09/2023 7.1 (H) 4.8 - 5.6 % Final    Comment:             Prediabetes: 5.7 - 6.4          Diabetes: >6.4          Glycemic control for adults with diabetes: <7.0          Passed - eGFR in normal range and within 360 days    GFR calc Af Amer  Date Value Ref Range Status  03/28/2020 96 >59 mL/min/1.73 Final    Comment:    **In accordance with recommendations from the NKF-ASN Task force,**   [facility] is in the process of updating its eGFR calculation to the   2021 CKD-EPI creatinine equation that estimates kidney function   without a race variable.    GFR, Estimated  Date Value Ref Range Status  04/22/2023 >60 >60 mL/min Final    Comment:    (NOTE) Calculated using the CKD-EPI Creatinine Equation (2021)    eGFR  Date Value Ref Range Status  12/08/2023 72 >59 mL/min/1.73 Final         Passed - Valid encounter within last 6 months    Recent Outpatient Visits           2 weeks ago Dysuria   [place] [facility] [place], Parris LABOR, MD   2 weeks ago Wheezing   [place] [facility] [place], [place] P, DO   2 months ago Diabetes mellitus with cardiac complication [facility])   [place]  [facility] [place], Megan P, DO   5 months ago Acute cystitis with hematuria   [place] [place], Megan P, DO   6 months ago Diarrhea, unspecified type   [place] [facility] [place] Duwaine SQUIBB, DO       Future Appointments             In 2 months Furth, Cadence H, PA-C [facility] [place]

## 2024-01-19 NOTE — Telephone Encounter (Signed)
 Requested Prescriptions  Refused Prescriptions Disp Refills   albuterol  (VENTOLIN  HFA) 108 (90 Base) MCG/ACT inhaler [Pharmacy Med Name: ALBUTEROL  HFA (PROVENTIL ) INH] 6.7 each 3    Sig: INHALE 1-2 PUFFS BY MOUTH EVERY 6 HOURS AS NEEDED FOR WHEEZE OR SHORTNESS OF BREATH     Pulmonology:  Beta Agonists 2 Passed - 01/19/2024  3:19 PM      Passed - Last BP in normal range    BP Readings from Last 1 Encounters:  01/04/24 128/82         Passed - Last Heart Rate in normal range    Pulse Readings from Last 1 Encounters:  01/04/24 74         Passed - Valid encounter within last 12 months    Recent Outpatient Visits           2 weeks ago Dysuria   [place] [facility] [place], Parris LABOR, MD   2 weeks ago Wheezing   [place] [facility] [place], [place] P, DO   2 months ago Diabetes mellitus with cardiac complication [facility])   [place] [facility] [place], Megan P, DO   5 months ago Acute cystitis with hematuria   [place] [facility] [place], Megan P, DO   6 months ago Diarrhea, unspecified type   [place] [facility] [place] Duwaine SQUIBB, DO       Future Appointments             In 2 months Furth, Cadence H, PA-C [facility] [place]

## 2024-01-24 ENCOUNTER — Other Ambulatory Visit (HOSPITAL_COMMUNITY): Payer: Self-pay

## 2024-01-24 ENCOUNTER — Other Ambulatory Visit: Payer: Self-pay | Admitting: Cardiology

## 2024-01-24 MED ORDER — METOPROLOL SUCCINATE ER 50 MG PO TB24
150.0000 mg | ORAL_TABLET | Freq: Every day | ORAL | 3 refills | Status: DC
Start: 2024-01-24 — End: 2024-03-16

## 2024-02-01 ENCOUNTER — Other Ambulatory Visit: Payer: Self-pay | Admitting: Cardiology

## 2024-02-01 DIAGNOSIS — I429 Cardiomyopathy, unspecified: Secondary | ICD-10-CM

## 2024-02-03 ENCOUNTER — Ambulatory Visit (INDEPENDENT_AMBULATORY_CARE_PROVIDER_SITE_OTHER): Payer: Medicare HMO

## 2024-02-03 DIAGNOSIS — I429 Cardiomyopathy, unspecified: Secondary | ICD-10-CM

## 2024-02-08 LAB — CUP PACEART REMOTE DEVICE CHECK
Date Time Interrogation Session: 20250829133547
Implantable Lead Connection Status: 753985
Implantable Lead Implant Date: 20240529
Implantable Lead Location: 753860
Implantable Lead Model: 436909
Implantable Lead Serial Number: 81545001
Implantable Pulse Generator Implant Date: 20240529
Pulse Gen Model: 429525
Pulse Gen Serial Number: 84937644

## 2024-02-09 ENCOUNTER — Encounter: Payer: Self-pay | Admitting: Family Medicine

## 2024-02-09 ENCOUNTER — Ambulatory Visit (INDEPENDENT_AMBULATORY_CARE_PROVIDER_SITE_OTHER): Admitting: Family Medicine

## 2024-02-09 VITALS — BP 168/133 | HR 101 | Ht 73.0 in

## 2024-02-09 DIAGNOSIS — E538 Deficiency of other specified B group vitamins: Secondary | ICD-10-CM | POA: Diagnosis not present

## 2024-02-09 DIAGNOSIS — H9193 Unspecified hearing loss, bilateral: Secondary | ICD-10-CM

## 2024-02-09 DIAGNOSIS — Z0000 Encounter for general adult medical examination without abnormal findings: Secondary | ICD-10-CM

## 2024-02-09 DIAGNOSIS — E0590 Thyrotoxicosis, unspecified without thyrotoxic crisis or storm: Secondary | ICD-10-CM

## 2024-02-09 DIAGNOSIS — E785 Hyperlipidemia, unspecified: Secondary | ICD-10-CM

## 2024-02-09 DIAGNOSIS — R3911 Hesitancy of micturition: Secondary | ICD-10-CM | POA: Diagnosis not present

## 2024-02-09 DIAGNOSIS — E1159 Type 2 diabetes mellitus with other circulatory complications: Secondary | ICD-10-CM | POA: Diagnosis not present

## 2024-02-09 DIAGNOSIS — I10 Essential (primary) hypertension: Secondary | ICD-10-CM

## 2024-02-09 DIAGNOSIS — I4819 Other persistent atrial fibrillation: Secondary | ICD-10-CM

## 2024-02-09 DIAGNOSIS — Z23 Encounter for immunization: Secondary | ICD-10-CM

## 2024-02-09 DIAGNOSIS — I700 Atherosclerosis of aorta: Secondary | ICD-10-CM | POA: Diagnosis not present

## 2024-02-09 DIAGNOSIS — B182 Chronic viral hepatitis C: Secondary | ICD-10-CM

## 2024-02-09 DIAGNOSIS — I5022 Chronic systolic (congestive) heart failure: Secondary | ICD-10-CM

## 2024-02-09 LAB — MICROALBUMIN, URINE WAIVED
Creatinine, Urine Waived: 50 mg/dL (ref 10–300)
Microalb, Ur Waived: 80 mg/L — ABNORMAL HIGH (ref 0–19)

## 2024-02-09 LAB — BAYER DCA HB A1C WAIVED: HB A1C (BAYER DCA - WAIVED): 6.9 % — ABNORMAL HIGH (ref 4.8–5.6)

## 2024-02-09 MED ORDER — RYBELSUS 7 MG PO TABS: 7.0000 mg | ORAL_TABLET | Freq: Every day | ORAL | 0 refills | Status: DC

## 2024-02-09 MED ORDER — RYBELSUS 3 MG PO TABS: 3.0000 mg | ORAL_TABLET | Freq: Every day | ORAL | Status: DC

## 2024-02-09 NOTE — Assessment & Plan Note (Signed)
 Under good control on current regimen. Continue current regimen. Continue to monitor. Call with any concerns. Refills given. Labs drawn today.

## 2024-02-09 NOTE — Assessment & Plan Note (Signed)
Continue to follow with cardiology. Euvolemic today. Call with any concerns.

## 2024-02-09 NOTE — Assessment & Plan Note (Signed)
 Congratulated patient on 7lb weight loss since last visit. Will start him on rybelsus  for glycemic control and weight loss. Recheck 6 weeks. Call with any concerns.

## 2024-02-09 NOTE — Assessment & Plan Note (Signed)
 Running high. Starting on rybelsus . Will hold on changing medicine at this time. Recheck 6 weeks. Monitor at home. Call if >140/90

## 2024-02-09 NOTE — Progress Notes (Signed)
 BP (!) 168/133 (BP Location: Left Arm, Patient Position: Sitting, Cuff Size: Normal)   Pulse (!) 101   Ht 6' 1 (1.854 m)   SpO2 97%   BMI 36.99 kg/m    Subjective:    Patient ID: Eugene Henderson, male    DOB: 18-Mar-1951, 73 y.o.   MRN: [id number]  HPI: Eugene Henderson is a 73 y.o. male presenting on 02/09/2024 for comprehensive medical examination. Current medical complaints include:  DIABETES Hypoglycemic episodes:no Polydipsia/polyuria: no Visual disturbance: no Chest pain: no Paresthesias: yes Glucose Monitoring: no  Accucheck frequency: Not Checking  Fasting glucose:  Post prandial:  Evening:  Before meals: Taking Insulin?: no  Long acting insulin:  Short acting insulin: Blood Pressure Monitoring: not checking Retinal Examination: Up to Date Foot Exam: Up to Date Diabetic Education: Completed Pneumovax: Up to Date Influenza: Up to Date Aspirin : no  HYPERTENSION / HYPERLIPIDEMIA Satisfied with current treatment? yes Duration of hypertension: chronic BP monitoring frequency: not checking BP medication side effects: no Past BP meds: lasix , metoprolol , entresto , spironalactone Duration of hyperlipidemia: chronic Cholesterol medication side effects: no Cholesterol supplements: none Past cholesterol medications: atorvastatin  Medication compliance: good compliance Aspirin : no Recent stressors: no Recurrent headaches: no Visual changes: no Palpitations: no Dyspnea: no Chest pain: no Lower extremity edema: no Dizzy/lightheaded: no   He currently lives with: daughters  Interim Problems from his last visit: no  Depression Screen done today and results listed below:     02/09/2024    9:25 AM 07/13/2023    9:53 AM 03/10/2023    2:36 PM 01/10/2023   10:00 AM 10/05/2022    2:52 PM  Depression screen PHQ 2/9  Decreased Interest 0 0 0 0 0  Down, Depressed, Hopeless 0 0 0 0 1  PHQ - 2 Score 0 0 0 0 1  Altered sleeping 0 1 0 2 2  Tired, decreased energy 0 2 0 1 2   Change in appetite 0 1 0 2 1  Feeling bad or failure about yourself  0 1 0 0 1  Trouble concentrating 0 1 0 2 1  Moving slowly or fidgety/restless 0 0 0 0 0  Suicidal thoughts 0 0 0 0 0  PHQ-9 Score 0 6 0 7 8  Difficult doing work/chores Not difficult at all  Not difficult at all Not difficult at all Somewhat difficult    Past Medical History:  Past Medical History:  Diagnosis Date   Arthritis    Ascending aortic aneurysm (HCC)    a. 09/2017 Stable TAA - 5.1cm.   Asthma    Chronic systolic CHF (congestive heart failure) (HCC)    a. EF 25-30% by echo in 07/2016 with cath showing no significant CAD b. 01/2017: EF 30-35% with diffuse HK and moderate MR; c. 07/2017 Echo: EF 30-35%, diff hK. Mild MR, mildly dil LA. PASP .   Diverticulitis    Diverticulitis of large intestine with perforation without abscess or bleeding 05/13/2017   Hypertension    Hyperthyroidism    NICM (nonischemic cardiomyopathy) (HCC)    Noncompliance    Persistent atrial fibrillation (HCC)    a. CHA2DS2VASc = 3-->Eliquis  (? compliance).    Surgical History:  Past Surgical History:  Procedure Laterality Date   CARDIOVERSION N/A 06/23/2022   Procedure: CARDIOVERSION;  Surgeon: Gardenia Led, DO;  Location: [facility] ORS;  Service: Cardiovascular;  Laterality: N/A;   CARDIOVERSION N/A 06/25/2022   Procedure: CARDIOVERSION;  Surgeon: Gardenia Led, DO;  Location: [facility];  Service: Cardiovascular;  Laterality: N/A;   CARDIOVERSION N/A 09/29/2022   Procedure: CARDIOVERSION;  Surgeon: Rolan Ezra RAMAN, MD;  Location: [facility] INVASIVE CV LAB;  Service: Cardiovascular;  Laterality: N/A;   COLONOSCOPY N/A 08/27/2019   Procedure: COLONOSCOPY;  Surgeon: Toledo, Ladell POUR, MD;  Location: [facility] ENDOSCOPY;  Service: Gastroenterology;  Laterality: N/A;   ENDOVASCULAR REPAIR/STENT GRAFT N/A 06/17/2022   Procedure: ENDOVASCULAR REPAIR/STENT GRAFT;  Surgeon: Marea Selinda RAMAN, MD;  Location: [facility] INVASIVE CV LAB;  Service:  Cardiovascular;  Laterality: N/A;   ICD IMPLANT N/A 11/03/2022   Procedure: ICD IMPLANT;  Surgeon: Cindie Ole DASEN, MD;  Location: [facility] INVASIVE CV LAB;  Service: Cardiovascular;  Laterality: N/A;   JOINT REPLACEMENT Right    RIGHT HEART CATH Right 09/21/2022   Procedure: RIGHT HEART CATH;  Surgeon: Rolan Ezra RAMAN, MD;  Location: [facility] INVASIVE CV LAB;  Service: Cardiovascular;  Laterality: Right;   RIGHT/LEFT HEART CATH AND CORONARY ANGIOGRAPHY N/A 08/02/2016   Procedure: Right/Left Heart Cath and Coronary Angiography;  Surgeon: Deatrice DELENA Cage, MD;  Location: [facility] INVASIVE CV LAB;  Service: Cardiovascular;  Laterality: N/A;   RIGHT/LEFT HEART CATH AND CORONARY ANGIOGRAPHY Bilateral 12/16/2023   Procedure: RIGHT/LEFT HEART CATH AND CORONARY ANGIOGRAPHY;  Surgeon: Cage Deatrice DELENA, MD;  Location: [facility] INVASIVE CV LAB;  Service: Cardiovascular;  Laterality: Bilateral;   TEE WITHOUT CARDIOVERSION N/A 06/23/2022   Procedure: TRANSESOPHAGEAL ECHOCARDIOGRAM;  Surgeon: Gardenia Led, DO;  Location: [facility] ORS;  Service: Cardiovascular;  Laterality: N/A;   TESTICLE SURGERY     Patient states that he had to have the tube fixed.    Medications:  Current Outpatient Medications on File Prior to Visit  Medication Sig   acetaminophen  (TYLENOL ) 500 MG tablet Take 1-2 tablets (500-1,000 mg total) by mouth every 6 (six) hours as needed for mild pain (pain score 1-3), fever or headache. (Patient taking differently: Take 1,000 mg by mouth every 6 (six) hours as needed for mild pain (pain score 1-3), fever or headache.)   albuterol  (VENTOLIN  HFA) 108 (90 Base) MCG/ACT inhaler Inhale 1-2 puffs into the lungs every 6 (six) hours as needed for wheezing or shortness of breath.   atorvastatin  (LIPITOR) 40 MG tablet Take 1 tablet (40 mg total) by mouth daily.   Blood Pressure Monitoring (BLOOD PRESSURE CUFF) MISC Check blood pressure as instructed by your physician   digoxin  (LANOXIN ) 0.125 MG tablet TAKE 1/2  TABLET BY MOUTH EVERY DAY   ELIQUIS  5 MG TABS tablet TAKE 1 TABLET BY MOUTH TWICE A DAY   empagliflozin  (JARDIANCE ) 10 MG TABS tablet TAKE 1 TABLET EVERY DAY   folic acid  (FOLVITE ) 1 MG tablet Take 1 tablet (1 mg total) by mouth daily.   furosemide  (LASIX ) 40 MG tablet Take 1 tablet (40 mg total) by mouth daily.   KLOR-CON  M20 20 MEQ tablet TAKE 1 TABLET BY MOUTH EVERY DAY   metFORMIN  (GLUCOPHAGE ) 500 MG tablet Take 1 tablet (500 mg total) by mouth 2 (two) times daily with a meal.   metoprolol  succinate (TOPROL -XL) 50 MG 24 hr tablet Take 3 tablets (150 mg total) by mouth daily. TAKE WITH OR IMMEDIATELY FOLLOWING A MEAL. PLEASE SCHEDULE APPOINTMENT FOR MORE REFILLS   nitroGLYCERIN  (NITROSTAT ) 0.4 MG SL tablet Place 1 tablet (0.4 mg total) under the tongue every 5 (five) minutes as needed for chest pain. This may be repeated twice and please call 911 when taking the third dose.   pantoprazole  (PROTONIX ) 40 MG tablet Take 1 tablet (40 mg total) by mouth  2 (two) times daily.   sacubitril -valsartan  (ENTRESTO ) 49-51 MG Take 1 tablet by mouth 2 (two) times daily.   spironolactone  (ALDACTONE ) 25 MG tablet Take 1 tablet (25 mg total) by mouth daily.   Tiotropium Bromide-Olodaterol (STIOLTO RESPIMAT ) 2.5-2.5 MCG/ACT AERS INHALE 2 PUFFS BY MOUTH INTO THE LUNGS DAILY   No current facility-administered medications on file prior to visit.    Allergies:  No Known Allergies  Social History:  Social History   Socioeconomic History   Marital status: Single    Spouse name: Not on file   Number of children: Not on file   Years of education: Not on file   Highest education level: Not on file  Occupational History   Occupation: retired  Tobacco Use   Smoking status: Some Days    Current packs/day: 0.00    Types: Pipe, Cigarettes    Last attempt to quit: 12/27/2022    Years since quitting: 1.1   Smokeless tobacco: Never  Vaping Use   Vaping status: Never Used  Substance and Sexual Activity    Alcohol use: Yes    Alcohol/week: 2.0 standard drinks of alcohol    Types: 2 Cans of beer per week    Comment: 2 40oz beers/week   Drug use: No   Sexual activity: Not Currently  Other Topics Concern   Not on file  Social History Narrative   Not on file   Social Drivers of Health   Financial Resource Strain: Low Risk  (07/27/2022)   Overall Financial Resource Strain (CARDIA)    Difficulty of Paying Living Expenses: Not very hard  Food Insecurity: No Food Insecurity (02/09/2024)   Hunger Vital Sign    Worried About Running Out of Food in the Last Year: Never true    Ran Out of Food in the Last Year: Never true  Transportation Needs: Unmet Transportation Needs (02/09/2024)   PRAPARE - Transportation    Lack of Transportation (Medical): Yes    Lack of Transportation (Non-Medical): Yes  Physical Activity: Inactive (07/27/2022)   Exercise Vital Sign    Days of Exercise per Week: 0 days    Minutes of Exercise per Session: 0 min  Stress: No Stress Concern Present (07/27/2022)   [employer] of Occupational Health - Occupational Stress Questionnaire    Feeling of Stress : Not at all  Social Connections: Socially Isolated (07/27/2022)   Social Connection and Isolation Panel    Frequency of Communication with Friends and Family: More than three times a week    Frequency of Social Gatherings with Friends and Family: More than three times a week    Attends Religious Services: Never    Database administrator or Organizations: No    Attends Banker Meetings: Never    Marital Status: Divorced  Catering manager Violence: Not At Risk (07/27/2022)   Humiliation, Afraid, Rape, and Kick questionnaire    Fear of Current or Ex-Partner: No    Emotionally Abused: No    Physically Abused: No    Sexually Abused: No   Social History   Tobacco Use  Smoking Status Some Days   Current packs/day: 0.00   Types: Pipe, Cigarettes   Last attempt to quit: 12/27/2022   Years since quitting:  1.1  Smokeless Tobacco Never   Social History   Substance and Sexual Activity  Alcohol Use Yes   Alcohol/week: 2.0 standard drinks of alcohol   Types: 2 Cans of beer per week   Comment: 2 40oz beers/week  Family History:  Family History  Problem Relation Age of Onset   Diabetes Mother    Diabetes Father    Heart disease Brother    Heart attack Brother    Diabetes Brother    Kidney failure Brother    Thyroid  disease Neg Hx     Past medical history, surgical history, medications, allergies, family history and social history reviewed with patient today and changes made to appropriate areas of the chart.   Review of Systems  Constitutional: Negative.   HENT:  Positive for hearing loss. Negative for congestion, ear discharge, ear pain, nosebleeds, sinus pain, sore throat and tinnitus.   Eyes:  Positive for blurred vision. Negative for double vision, photophobia, pain, discharge and redness.  Respiratory:  Positive for cough and shortness of breath. Negative for hemoptysis, sputum production, wheezing and stridor.   Cardiovascular:  Positive for chest pain and leg swelling. Negative for palpitations, orthopnea, claudication and PND.  Gastrointestinal:  Positive for diarrhea and heartburn. Negative for abdominal pain, blood in stool, constipation, melena, nausea and vomiting.  Genitourinary:  Positive for frequency. Negative for dysuria, flank pain, hematuria and urgency.  Musculoskeletal:  Positive for back pain and joint pain. Negative for falls, myalgias and neck pain.  Skin: Negative.   Neurological:  Positive for dizziness. Negative for tingling, tremors, sensory change, speech change, focal weakness, seizures, loss of consciousness, weakness and headaches.  Endo/Heme/Allergies:  Positive for polydipsia. Negative for environmental allergies. Does not bruise/bleed easily.  Psychiatric/Behavioral: Negative.     All other ROS negative except what is listed above and in the HPI.       Objective:    BP (!) 168/133 (BP Location: Left Arm, Patient Position: Sitting, Cuff Size: Normal)   Pulse (!) 101   Ht 6' 1 (1.854 m)   SpO2 97%   BMI 36.99 kg/m   Wt Readings from Last 3 Encounters:  01/04/24 280 lb 6.4 oz (127.2 kg)  12/30/23 274 lb 9.6 oz (124.6 kg)  12/16/23 282 lb (127.9 kg)    Physical Exam Vitals and nursing note reviewed.  Constitutional:      General: He is not in acute distress.    Appearance: Normal appearance. He is obese. He is not ill-appearing, toxic-appearing or diaphoretic.  HENT:     Head: Normocephalic and atraumatic.     Right Ear: Tympanic membrane, ear canal and external ear normal. There is no impacted cerumen.     Left Ear: Tympanic membrane, ear canal and external ear normal. There is no impacted cerumen.     Nose: Nose normal. No congestion or rhinorrhea.     Mouth/Throat:     Mouth: Mucous membranes are moist.     Pharynx: Oropharynx is clear. No oropharyngeal exudate or posterior oropharyngeal erythema.  Eyes:     General: No scleral icterus.       Right eye: No discharge.        Left eye: No discharge.     Extraocular Movements: Extraocular movements intact.     Conjunctiva/sclera: Conjunctivae normal.     Pupils: Pupils are equal, round, and reactive to light.  Neck:     Vascular: No carotid bruit.  Cardiovascular:     Rate and Rhythm: Normal rate and regular rhythm.     Pulses: Normal pulses.     Heart sounds: No murmur heard.    No friction rub. No gallop.  Pulmonary:     Effort: Pulmonary effort is normal. No respiratory distress.  Breath sounds: Normal breath sounds. No stridor. No wheezing, rhonchi or rales.  Chest:     Chest wall: No tenderness.  Abdominal:     General: Abdomen is flat. Bowel sounds are normal. There is no distension.     Palpations: Abdomen is soft. There is no mass.     Tenderness: There is no abdominal tenderness. There is no right CVA tenderness, left CVA tenderness, guarding or  rebound.     Hernia: No hernia is present.  Genitourinary:    Comments: Genital exam deferred with shared decision making Musculoskeletal:        General: No swelling, tenderness, deformity or signs of injury.     Cervical back: Normal range of motion and neck supple. No rigidity. No muscular tenderness.     Right lower leg: No edema.     Left lower leg: No edema.  Lymphadenopathy:     Cervical: No cervical adenopathy.  Skin:    General: Skin is warm and dry.     Capillary Refill: Capillary refill takes less than 2 seconds.     Coloration: Skin is not jaundiced or pale.     Findings: No bruising, erythema, lesion or rash.  Neurological:     General: No focal deficit present.     Mental Status: He is alert and oriented to person, place, and time.     Cranial Nerves: No cranial nerve deficit.     Sensory: No sensory deficit.     Motor: No weakness.     Coordination: Coordination normal.     Gait: Gait normal.     Deep Tendon Reflexes: Reflexes normal.  Psychiatric:        Mood and Affect: Mood normal.        Behavior: Behavior normal.        Thought Content: Thought content normal.        Judgment: Judgment normal.     Results for orders placed or performed in visit on 02/09/24  Microalbumin, Urine Waived   Collection Time: 02/09/24  9:33 AM  Result Value Ref Range   Microalb, Ur Waived 80 (H) 0 - 19 mg/L   Creatinine, Urine Waived 50 10 - 300 mg/dL   Microalb/Creat Ratio 30-300 (H) <30 mg/g  Bayer DCA Hb A1c Waived   Collection Time: 02/09/24  9:33 AM  Result Value Ref Range   HB A1C (BAYER DCA - WAIVED) 6.9 (H) 4.8 - 5.6 %      Assessment & Plan:   Problem List Items Addressed This Visit       Cardiovascular and Mediastinum   Chronic systolic heart failure (HCC) (Chronic)   Continue to follow with cardiology. Euvolemic today. Call with any concerns.       Persistent atrial fibrillation (HCC)   Continue to follow with cardiology. Call with any concerns.  Continue to monitor.       Essential hypertension   Running high. Starting on rybelsus . Will hold on changing medicine at this time. Recheck 6 weeks. Monitor at home. Call if >140/90      Relevant Orders   Comprehensive metabolic panel with GFR   CBC with Differential/Platelet   Microalbumin, Urine Waived (Completed)   Diabetes mellitus with cardiac complication (HCC)   Doing well with A1c of 6.9, but still high- will start him on rybelsus . Follow up 6 weeks. Call with any concerns.       Relevant Medications   Semaglutide  (RYBELSUS ) 3 MG TABS   Semaglutide  (RYBELSUS ) 7  MG TABS (Start on 03/08/2024)   Other Relevant Orders   Comprehensive metabolic panel with GFR   CBC with Differential/Platelet   Lipid Panel w/o Chol/HDL Ratio   Bayer DCA Hb A1c Waived   Aortic atherosclerosis (HCC)   Under good control on current regimen. Continue current regimen. Continue to monitor. Call with any concerns. Refills given. Labs drawn today.         Digestive   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)   S/P treatment. Will check labs today. Await results. Treat as needed.       Relevant Orders   Comprehensive metabolic panel with GFR   CBC with Differential/Platelet   HCV RNA quant     Endocrine   Hyperthyroidism   Rechecking labs today. Await results. Treat as needed.       Relevant Orders   Comprehensive metabolic panel with GFR   CBC with Differential/Platelet   TSH     Other   Morbid obesity (HCC)   Congratulated patient on 7lb weight loss since last visit. Will start him on rybelsus  for glycemic control and weight loss. Recheck 6 weeks. Call with any concerns.       Relevant Medications   Semaglutide  (RYBELSUS ) 3 MG TABS   Semaglutide  (RYBELSUS ) 7 MG TABS (Start on 03/08/2024)   Hyperlipidemia   Under good control on current regimen. Continue current regimen. Continue to monitor. Call with any concerns. Refills given. Labs drawn today.        Other Visit  Diagnoses       Encounter for Medicare annual wellness exam    -  Primary   Preventative care discussed today. Call with any concerns.     Routine general medical examination at a health care facility       Vaccines updated. Screening labs checked today. Colonoscopy up to date. Continue diet and exercise. Call with any concerns.     Folate deficiency       Labs drawn today. Await results. Treat as needed.   Relevant Orders   Comprehensive metabolic panel with GFR   CBC with Differential/Platelet   Folate     Bilateral hearing loss, unspecified hearing loss type       Referral to audiology placed today.   Relevant Orders   Ambulatory referral to Audiology     Hesitancy       Labs drawn today. Await results.   Relevant Orders   PSA     Needs flu shot       Flu shot given today.   Relevant Orders   Flu vaccine HIGH DOSE PF(Fluzone Trivalent) (Completed)        LABORATORY TESTING:  Health maintenance labs ordered today as discussed above.   The natural history of prostate cancer and ongoing controversy regarding screening and potential treatment outcomes of prostate cancer has been discussed with the patient. The meaning of a false positive PSA and a false negative PSA has been discussed. He indicates understanding of the limitations of this screening test and wishes to proceed with screening PSA testing.   IMMUNIZATIONS:   - Tdap: Tetanus vaccination status reviewed: last tetanus booster within 10 years. - Influenza: Administered today - Pneumovax: Up to date - Prevnar: Up to date - COVID: will get when available - HPV: Not applicable - Shingrix vaccine: Given elsewhere  SCREENING: - Colonoscopy: Up to date  Discussed with patient purpose of the colonoscopy is to detect colon cancer at curable precancerous or early stages  PATIENT COUNSELING:    Sexuality: Discussed sexually transmitted diseases, partner selection, use of condoms, avoidance of unintended pregnancy  and  contraceptive alternatives.   Advised to avoid cigarette smoking.  I discussed with the patient that most people either abstain from alcohol or drink within safe limits (<=14/week and <=4 drinks/occasion for males, <=7/weeks and <= 3 drinks/occasion for females) and that the risk for alcohol disorders and other health effects rises proportionally with the number of drinks per week and how often a drinker exceeds daily limits.  Discussed cessation/primary prevention of drug use and availability of treatment for abuse.   Diet: Encouraged to adjust caloric intake to maintain  or achieve ideal body weight, to reduce intake of dietary saturated fat and total fat, to limit sodium intake by avoiding high sodium foods and not adding table salt, and to maintain adequate dietary potassium and calcium  preferably from fresh fruits, vegetables, and low-fat dairy products.    stressed the importance of regular exercise  Injury prevention: Discussed safety belts, safety helmets, smoke detector, smoking near bedding or upholstery.   Dental health: Discussed importance of regular tooth brushing, flossing, and dental visits.   Follow up plan: NEXT PREVENTATIVE PHYSICAL DUE IN 1 YEAR. Return in about 6 weeks (around 03/22/2024).

## 2024-02-09 NOTE — Assessment & Plan Note (Signed)
 Doing well with A1c of 6.9, but still high- will start him on rybelsus . Follow up 6 weeks. Call with any concerns.

## 2024-02-09 NOTE — Assessment & Plan Note (Signed)
 S/P treatment. Will check labs today. Await results. Treat as needed.

## 2024-02-09 NOTE — Progress Notes (Signed)
 Subjective:   Eugene Henderson is a 73 y.o. male who presents for Medicare Annual/Subsequent preventive examination.  Visit Complete: In person  Patient Medicare AWV questionnaire was completed by the patient on 02/09/24; I have confirmed that all information answered by patient is correct and no changes since this date.  Cardiac Risk Factors include: advanced age (>34men, >33 women);diabetes mellitus;dyslipidemia;hypertension;male gender;obesity (BMI >30kg/m2);sedentary lifestyle     Objective:    Today's Vitals   02/09/24 0952  BP: (!) 168/133  Pulse: (!) 101  SpO2: 97%  Height: 6' 1 (1.854 m)  PainSc: 8    Body mass index is 36.99 kg/m.     02/09/2024   10:15 AM 11/03/2022    7:05 AM 09/21/2022    1:11 PM 06/25/2022    9:25 AM 06/15/2022    2:41 AM 05/24/2022    5:36 PM 07/11/2020    1:11 PM  Advanced Directives  Does Patient Have a Medical Advance Directive? Yes No Yes Yes No No No  Type of Advance Directive   Healthcare Power of Attorney      Does patient want to make changes to medical advance directive? No - Patient declined  No - Guardian declined      Copy of Healthcare Power of Attorney in Chart?   No - copy requested      Would patient like information on creating a medical advance directive?  No - Patient declined   No - Patient declined No - Patient declined     Current Medications (verified) Outpatient Encounter Medications as of 02/09/2024  Medication Sig   acetaminophen  (TYLENOL ) 500 MG tablet Take 1-2 tablets (500-1,000 mg total) by mouth every 6 (six) hours as needed for mild pain (pain score 1-3), fever or headache. (Patient taking differently: Take 1,000 mg by mouth every 6 (six) hours as needed for mild pain (pain score 1-3), fever or headache.)   albuterol  (VENTOLIN  HFA) 108 (90 Base) MCG/ACT inhaler Inhale 1-2 puffs into the lungs every 6 (six) hours as needed for wheezing or shortness of breath.   atorvastatin  (LIPITOR) 40 MG tablet Take 1 tablet (40 mg  total) by mouth daily.   Blood Pressure Monitoring (BLOOD PRESSURE CUFF) MISC Check blood pressure as instructed by your physician   digoxin  (LANOXIN ) 0.125 MG tablet TAKE 1/2 TABLET BY MOUTH EVERY DAY   ELIQUIS  5 MG TABS tablet TAKE 1 TABLET BY MOUTH TWICE A DAY   empagliflozin  (JARDIANCE ) 10 MG TABS tablet TAKE 1 TABLET EVERY DAY   folic acid  (FOLVITE ) 1 MG tablet Take 1 tablet (1 mg total) by mouth daily.   furosemide  (LASIX ) 40 MG tablet Take 1 tablet (40 mg total) by mouth daily.   KLOR-CON  M20 20 MEQ tablet TAKE 1 TABLET BY MOUTH EVERY DAY   metFORMIN  (GLUCOPHAGE ) 500 MG tablet Take 1 tablet (500 mg total) by mouth 2 (two) times daily with a meal.   metoprolol  succinate (TOPROL -XL) 50 MG 24 hr tablet Take 3 tablets (150 mg total) by mouth daily. TAKE WITH OR IMMEDIATELY FOLLOWING A MEAL. PLEASE SCHEDULE APPOINTMENT FOR MORE REFILLS   nitroGLYCERIN  (NITROSTAT ) 0.4 MG SL tablet Place 1 tablet (0.4 mg total) under the tongue every 5 (five) minutes as needed for chest pain. This may be repeated twice and please call 911 when taking the third dose.   pantoprazole  (PROTONIX ) 40 MG tablet Take 1 tablet (40 mg total) by mouth 2 (two) times daily.   sacubitril -valsartan  (ENTRESTO ) 49-51 MG Take 1 tablet by  mouth 2 (two) times daily.   Semaglutide  (RYBELSUS ) 3 MG TABS Take 1 tablet (3 mg total) by mouth daily.   [START ON 03/08/2024] Semaglutide  (RYBELSUS ) 7 MG TABS Take 1 tablet (7 mg total) by mouth daily.   spironolactone  (ALDACTONE ) 25 MG tablet Take 1 tablet (25 mg total) by mouth daily.   Tiotropium Bromide-Olodaterol (STIOLTO RESPIMAT ) 2.5-2.5 MCG/ACT AERS INHALE 2 PUFFS BY MOUTH INTO THE LUNGS DAILY   [DISCONTINUED] predniSONE  (DELTASONE ) 50 MG tablet Take 1 tablet (50 mg total) by mouth daily with breakfast. (Patient not taking: Reported on 02/09/2024)   No facility-administered encounter medications on file as of 02/09/2024.    Allergies (verified) Patient has no known allergies.    History: Past Medical History:  Diagnosis Date   Arthritis    Ascending aortic aneurysm (HCC)    a. 09/2017 Stable TAA - 5.1cm.   Asthma    Chronic systolic CHF (congestive heart failure) (HCC)    a. EF 25-30% by echo in 07/2016 with cath showing no significant CAD b. 01/2017: EF 30-35% with diffuse HK and moderate MR; c. 07/2017 Echo: EF 30-35%, diff hK. Mild MR, mildly dil LA. PASP .   Diverticulitis    Diverticulitis of large intestine with perforation without abscess or bleeding 05/13/2017   Hypertension    Hyperthyroidism    NICM (nonischemic cardiomyopathy) (HCC)    Noncompliance    Persistent atrial fibrillation (HCC)    a. CHA2DS2VASc = 3-->Eliquis  (? compliance).   Past Surgical History:  Procedure Laterality Date   CARDIOVERSION N/A 06/23/2022   Procedure: CARDIOVERSION;  Surgeon: Gardenia Led, DO;  Location: [facility] ORS;  Service: Cardiovascular;  Laterality: N/A;   CARDIOVERSION N/A 06/25/2022   Procedure: CARDIOVERSION;  Surgeon: Gardenia Led, DO;  Location: [facility] ORS;  Service: Cardiovascular;  Laterality: N/A;   CARDIOVERSION N/A 09/29/2022   Procedure: CARDIOVERSION;  Surgeon: Rolan Ezra RAMAN, MD;  Location: [facility] INVASIVE CV LAB;  Service: Cardiovascular;  Laterality: N/A;   COLONOSCOPY N/A 08/27/2019   Procedure: COLONOSCOPY;  Surgeon: Toledo, Ladell POUR, MD;  Location: [facility] ENDOSCOPY;  Service: Gastroenterology;  Laterality: N/A;   ENDOVASCULAR REPAIR/STENT GRAFT N/A 06/17/2022   Procedure: ENDOVASCULAR REPAIR/STENT GRAFT;  Surgeon: Marea Selinda RAMAN, MD;  Location: [facility] INVASIVE CV LAB;  Service: Cardiovascular;  Laterality: N/A;   ICD IMPLANT N/A 11/03/2022   Procedure: ICD IMPLANT;  Surgeon: Cindie Ole DASEN, MD;  Location: [facility] INVASIVE CV LAB;  Service: Cardiovascular;  Laterality: N/A;   JOINT REPLACEMENT Right    RIGHT HEART CATH Right 09/21/2022   Procedure: RIGHT HEART CATH;  Surgeon: Rolan Ezra RAMAN, MD;  Location: [facility] INVASIVE CV LAB;  Service:  Cardiovascular;  Laterality: Right;   RIGHT/LEFT HEART CATH AND CORONARY ANGIOGRAPHY N/A 08/02/2016   Procedure: Right/Left Heart Cath and Coronary Angiography;  Surgeon: Deatrice DELENA Cage, MD;  Location: [facility] INVASIVE CV LAB;  Service: Cardiovascular;  Laterality: N/A;   RIGHT/LEFT HEART CATH AND CORONARY ANGIOGRAPHY Bilateral 12/16/2023   Procedure: RIGHT/LEFT HEART CATH AND CORONARY ANGIOGRAPHY;  Surgeon: Cage Deatrice DELENA, MD;  Location: [facility] INVASIVE CV LAB;  Service: Cardiovascular;  Laterality: Bilateral;   TEE WITHOUT CARDIOVERSION N/A 06/23/2022   Procedure: TRANSESOPHAGEAL ECHOCARDIOGRAM;  Surgeon: Gardenia Led, DO;  Location: [facility] ORS;  Service: Cardiovascular;  Laterality: N/A;   TESTICLE SURGERY     Patient states that he had to have the tube fixed.   Family History  Problem Relation Age of Onset   Diabetes Mother    Diabetes Father  Heart disease Brother    Heart attack Brother    Diabetes Brother    Kidney failure Brother    Thyroid  disease Neg Hx    Social History   Socioeconomic History   Marital status: Single    Spouse name: Not on file   Number of children: Not on file   Years of education: Not on file   Highest education level: Not on file  Occupational History   Occupation: retired  Tobacco Use   Smoking status: Some Days    Current packs/day: 0.00    Types: Pipe, Cigarettes    Last attempt to quit: 12/27/2022    Years since quitting: 1.1   Smokeless tobacco: Never  Vaping Use   Vaping status: Never Used  Substance and Sexual Activity   Alcohol use: Yes    Alcohol/week: 2.0 standard drinks of alcohol    Types: 2 Cans of beer per week    Comment: 2 40oz beers/week   Drug use: No   Sexual activity: Not Currently  Other Topics Concern   Not on file  Social History Narrative   Not on file   Social Drivers of Health   Financial Resource Strain: Low Risk  (07/27/2022)   Overall Financial Resource Strain (CARDIA)    Difficulty of Paying Living  Expenses: Not very hard  Food Insecurity: No Food Insecurity (02/09/2024)   Hunger Vital Sign    Worried About Running Out of Food in the Last Year: Never true    Ran Out of Food in the Last Year: Never true  Transportation Needs: Unmet Transportation Needs (02/09/2024)   PRAPARE - Transportation    Lack of Transportation (Medical): Yes    Lack of Transportation (Non-Medical): Yes  Physical Activity: Inactive (07/27/2022)   Exercise Vital Sign    Days of Exercise per Week: 0 days    Minutes of Exercise per Session: 0 min  Stress: No Stress Concern Present (07/27/2022)   [employer] of Occupational Health - Occupational Stress Questionnaire    Feeling of Stress : Not at all  Social Connections: Socially Isolated (07/27/2022)   Social Connection and Isolation Panel    Frequency of Communication with Friends and Family: More than three times a week    Frequency of Social Gatherings with Friends and Family: More than three times a week    Attends Religious Services: Never    Database administrator or Organizations: No    Attends Banker Meetings: Never    Marital Status: Divorced    Tobacco Counseling Ready to quit: Not Answered Counseling given: Not Answered   Clinical Intake:     Pain Score: 8                   Activities of Daily Living    02/09/2024    9:55 AM 12/16/2023   10:09 AM  In your present state of health, do you have any difficulty performing the following activities:  Hearing? 1 0  Vision? 1 0  Difficulty concentrating or making decisions? 1 0  Walking or climbing stairs? 1   Dressing or bathing? 1   Doing errands, shopping? 1   Comment Does not drive   Preparing Food and eating ? Y   Using the Toilet? N   In the past six months, have you accidently leaked urine? N   Do you have problems with loss of bowel control? N   Managing your Medications? Y   Managing your Finances?  Y   Housekeeping or managing your Housekeeping? Y      Patient Care Team: Vicci Duwaine SQUIBB, DO as PCP - General (Family Medicine) Darron Deatrice LABOR, MD as PCP - Cardiology (Cardiology) Cindie Ole DASEN, MD as PCP - Electrophysiology (Cardiology) Donette Ellouise LABOR, FNP as Nurse Practitioner (Family Medicine) Darron Deatrice LABOR, MD as Consulting Physician (Cardiology) [place], [place], [place] as Social Worker  Indicate any recent Medical Services you may have received from other than [facility] providers in the past year (date may be approximate).     Assessment:   This is a routine wellness examination for Eugene Henderson.  Hearing/Vision screen No results found.   Goals Addressed   None    Depression Screen    02/09/2024    9:25 AM 07/13/2023    9:53 AM 03/10/2023    2:36 PM 01/10/2023   10:00 AM 10/05/2022    2:52 PM 08/31/2022    2:11 PM 08/19/2022    2:06 PM  PHQ 2/9 Scores  PHQ - 2 Score 0 0 0 0 1 2 1   PHQ- 9 Score 0 6 0 7 8 7 9     Fall Risk    02/09/2024   10:15 AM 07/13/2023    9:53 AM 03/10/2023    2:36 PM 01/10/2023   10:00 AM 10/05/2022    2:52 PM  Fall Risk   Falls in the past year? 0 0 0 0 0  Number falls in past yr: 0 0 0 0 0  Injury with Fall? 0 0 0 0 0  Risk for fall due to : Impaired balance/gait Impaired balance/gait;Impaired mobility No Fall Risks No Fall Risks No Fall Risks  Follow up Follow up appointment Falls evaluation completed Falls evaluation completed Falls evaluation completed Falls evaluation completed    MEDICARE RISK AT HOME: Medicare Risk at Home Any stairs in or around the home?: Yes If so, are there any without handrails?: No Home free of loose throw rugs in walkways, pet beds, electrical cords, etc?: Yes Adequate lighting in your home to reduce risk of falls?: Yes Life alert?: No Use of a cane, walker or w/c?: Yes Grab bars in the bathroom?: No Shower chair or bench in shower?: No Elevated toilet seat or a handicapped toilet?: No  TIMED UP AND GO:  Was the test performed?  Yes  Length of time to  ambulate 10 feet: 12 sec Gait slow and steady without use of assistive device    Cognitive Function:        02/09/2024    9:57 AM 08/19/2022    2:11 PM 08/19/2022    2:09 PM 07/11/2020    1:17 PM 05/16/2018    2:46 PM  6CIT Screen  What Year? 0 points 0 points 0 points 0 points 0 points  What month? 0 points 0 points 0 points 0 points 0 points  What time? 0 points 0 points 0 points 0 points 0 points  Count back from 20 0 points 0 points 0 points 0 points 0 points  Months in reverse 0 points 4 points 4 points 2 points 2 points  Repeat phrase 2 points 8 points 8 points 2 points 2 points  Total Score 2 points 12 points 12 points 4 points 4 points    Immunizations Immunization History  Administered Date(s) Administered   Fluad Quad(high Dose 65+) 03/28/2020, 06/18/2021, 06/29/2022   Fluad Trivalent(High Dose 65+) 04/12/2023   Hepatitis A, Adult 04/11/2018   Hepatitis B, ADULT 04/11/2018, 05/16/2018  INFLUENZA, HIGH DOSE SEASONAL PF 05/09/2017, 04/11/2018, 02/09/2024   Influenza-Unspecified 02/07/2017   Pfizer(Comirnaty)Fall Seasonal Vaccine 12 years and older 04/12/2023   Pneumococcal Conjugate-13 04/11/2018   Pneumococcal Polysaccharide-23 08/02/2016   Td 05/09/2017    TDAP status: Up to date  Flu Vaccine status: Completed at today's visit  Pneumococcal vaccine status: Up to date  Covid-19 vaccine status: Information provided on how to obtain vaccines.   Qualifies for Shingles Vaccine? Yes   Zostavax completed No   Shingrix Completed?: No.    Education has been provided regarding the importance of this vaccine. Patient has been advised to call insurance company to determine out of pocket expense if they have not yet received this vaccine. Advised may also receive vaccine at local pharmacy or Health Dept. Verbalized acceptance and understanding.  Screening Tests Health Maintenance  Topic Date Due   Zoster Vaccines- Shingrix (1 of 2) Never done   COVID-19 Vaccine (2 -  Pfizer risk series) 05/03/2023   OPHTHALMOLOGY EXAM  10/06/2023   HEMOGLOBIN A1C  08/08/2024   Diabetic kidney evaluation - eGFR measurement  12/07/2024   Diabetic kidney evaluation - Urine ACR  02/08/2025   FOOT EXAM  02/08/2025   Medicare Annual Wellness (AWV)  02/08/2025   DTaP/Tdap/Td (2 - Tdap) 05/10/2027   Colonoscopy  08/26/2029   Pneumococcal Vaccine: 50+ Years  Completed   INFLUENZA VACCINE  Completed   Hepatitis C Screening  Completed   HPV VACCINES  Aged Out   Meningococcal B Vaccine  Aged Out   Hepatitis B Vaccines 19-59 Average Risk  Discontinued    Health Maintenance  Health Maintenance Due  Topic Date Due   Zoster Vaccines- Shingrix (1 of 2) Never done   COVID-19 Vaccine (2 - Pfizer risk series) 05/03/2023   OPHTHALMOLOGY EXAM  10/06/2023    Colorectal cancer screening: Type of screening: Colonoscopy. Completed 08/27/19. Repeat every 10 years  Lung Cancer Screening: (Low Dose CT Chest recommended if Age 17-80 years, 20 pack-year currently smoking OR have quit w/in 15years.) does not qualify.    Additional Screening:  Hepatitis C Screening: does qualify; Completed today  Vision Screening: Recommended annual ophthalmology exams for early detection of glaucoma and other disorders of the eye. Is the patient up to date with their annual eye exam?  Yes  Who is the provider or what is the name of the office in which the patient attends annual eye exams? [facility]: Recommended annual dental exams for proper oral hygiene  Diabetic Foot Exam: Diabetic Foot Exam: Completed today  Community Resource Referral / Chronic Care Management: CRR required this visit?  No   CCM required this visit?  No     Plan:     I have personally reviewed and noted the following in the patient's chart:   Medical and social history Use of alcohol, tobacco or illicit drugs  Current medications and supplements including opioid prescriptions. Patient is currently  taking opioid prescriptions. Information provided to patient regarding non-opioid alternatives. Patient advised to discuss non-opioid treatment plan with their provider. Functional ability and status Nutritional status Physical activity Advanced directives List of other physicians Hospitalizations, surgeries, and ER visits in previous 12 months Vitals Screenings to include cognitive, depression, and falls Referrals and appointments  In addition, I have reviewed and discussed with patient certain preventive protocols, quality metrics, and best practice recommendations. A written personalized care plan for preventive services as well as general preventive health recommendations were provided to patient.  Duwaine Louder, DO   02/09/2024   After Visit Summary: (In Person-Printed) AVS printed and given to the patient

## 2024-02-09 NOTE — Assessment & Plan Note (Signed)
 Rechecking labs today. Await results. Treat as needed.

## 2024-02-09 NOTE — Assessment & Plan Note (Signed)
 Continue to follow with cardiology. Call with any concerns. Continue to monitor.

## 2024-02-10 LAB — HCV RNA QUANT

## 2024-02-11 ENCOUNTER — Ambulatory Visit: Payer: Self-pay | Admitting: Cardiology

## 2024-02-11 LAB — COMPREHENSIVE METABOLIC PANEL WITH GFR
ALT: 11 IU/L (ref 0–44)
AST: 14 IU/L (ref 0–40)
Albumin: 4.5 g/dL (ref 3.8–4.8)
Alkaline Phosphatase: 121 IU/L (ref 44–121)
BUN/Creatinine Ratio: 15 (ref 10–24)
BUN: 16 mg/dL (ref 8–27)
Bilirubin Total: 0.8 mg/dL (ref 0.0–1.2)
CO2: 19 mmol/L — ABNORMAL LOW (ref 20–29)
Calcium: 9.2 mg/dL (ref 8.6–10.2)
Chloride: 96 mmol/L (ref 96–106)
Creatinine, Ser: 1.09 mg/dL (ref 0.76–1.27)
Globulin, Total: 2.8 g/dL (ref 1.5–4.5)
Glucose: 141 mg/dL — ABNORMAL HIGH (ref 70–99)
Potassium: 4.4 mmol/L (ref 3.5–5.2)
Sodium: 134 mmol/L (ref 134–144)
Total Protein: 7.3 g/dL (ref 6.0–8.5)
eGFR: 72 mL/min/1.73 (ref >59)

## 2024-02-11 LAB — TSH: TSH: 3.81 u[IU]/mL (ref 0.450–4.500)

## 2024-02-11 LAB — CBC WITH DIFFERENTIAL/PLATELET
Basophils Absolute: 0 x10E3/uL (ref 0.0–0.2)
Basos: 1 %
EOS (ABSOLUTE): 0.2 x10E3/uL (ref 0.0–0.4)
Eos: 4 %
Hematocrit: 42.9 % (ref 37.5–51.0)
Hemoglobin: 13.8 g/dL (ref 13.0–17.7)
Immature Grans (Abs): 0 x10E3/uL (ref 0.0–0.1)
Immature Granulocytes: 0 %
Lymphocytes Absolute: 1.2 x10E3/uL (ref 0.7–3.1)
Lymphs: 29 %
MCH: 27.5 pg (ref 26.6–33.0)
MCHC: 32.2 g/dL (ref 31.5–35.7)
MCV: 86 fL (ref 79–97)
Monocytes Absolute: 0.5 x10E3/uL (ref 0.1–0.9)
Monocytes: 12 %
Neutrophils Absolute: 2.3 x10E3/uL (ref 1.4–7.0)
Neutrophils: 54 %
Platelets: 231 x10E3/uL (ref 150–450)
RBC: 5.01 x10E6/uL (ref 4.14–5.80)
RDW: 16.5 % — ABNORMAL HIGH (ref 11.6–15.4)
WBC: 4.2 x10E3/uL (ref 3.4–10.8)

## 2024-02-11 LAB — LIPID PANEL W/O CHOL/HDL RATIO
Cholesterol, Total: 129 mg/dL (ref 100–199)
HDL: 64 mg/dL (ref >39)
LDL Chol Calc (NIH): 42 mg/dL (ref 0–99)
Triglycerides: 137 mg/dL (ref 0–149)
VLDL Cholesterol Cal: 23 mg/dL (ref 5–40)

## 2024-02-11 LAB — HCV RNA QUANT: Hepatitis C Quantitation: NOT DETECTED [IU]/mL

## 2024-02-11 LAB — PSA: Prostate Specific Ag, Serum: 2 ng/mL (ref 0.0–4.0)

## 2024-02-11 LAB — FOLATE: Folate: >20 ng/mL (ref >3.0)

## 2024-02-12 ENCOUNTER — Ambulatory Visit: Payer: Self-pay | Admitting: Family Medicine

## 2024-02-13 NOTE — Progress Notes (Signed)
Remote ICD Transmission.

## 2024-02-29 ENCOUNTER — Encounter: Payer: Self-pay | Admitting: Family Medicine

## 2024-02-29 DIAGNOSIS — E08621 Diabetes mellitus due to underlying condition with foot ulcer: Secondary | ICD-10-CM

## 2024-03-01 NOTE — Progress Notes (Deleted)
 Cardiology Clinic Note   Date: 03/01/2024 ID: Eugene Henderson, DOB 27-Oct-1950, MRN [id number]  [facility] Providers Cardiologist:  Deatrice Cage, MD Electrophysiologist:  OLE ONEIDA HOLTS, MD   Chief Complaint   Eugene Henderson is a 73 y.o. male who presents to the clinic today for ***  Patient Profile   Eugene Henderson is followed by Dr. Cage and Dr. HOLTS for the history outlined below.      Past medical history significant for: Nonobstructive CAD. Cardiac PET/CT 11/17/2023: Findings consistent with small area of inferior apical infarction with peri-infarct ischemia.  Intermediate study due to reduced EF.  There is a small defect with mild reduction in uptake present in the apical to mid inferior location that is partially reversible.  There is abnormal wall motion in the defect area.  Consistent with infarction and peri-infarct ischemia.  Mild coronary calcifications of LAD, LCx, RCA. R/LHC 12/16/2023: Minor irregularities with no evidence of obstructive CAD. Chronic HFrEF/pulmonary hypertension. Echo 06/15/2022: EF 20 to 25%.  Global hypokinesis.  Mild LVH.  Severely dilated LV cavity.  Diastolic parameters could not be evaluated.  Severely reduced RV function.  Mild RVH.  Moderately elevated PA pressure.  Severe BAE.  Moderate MR.  Mild to moderate TR.  Severe dilatation of aortic root 53 mm, severe dilatation of ascending aorta 50 mm. ICD implantation 11/03/2022. Remote device check 02/03/2024: Normal device function.  Battery and lead parameters stable with stable capture and sensing.  Device programming is appropriate.  No ventricular arrhythmias noted. R/LHC 12/16/2023: Moderately elevated right atrial pressure, moderate pulmonary hypertension, normal cardiac output. PAF. DCCV 06/23/2022, 06/25/2022, 09/29/2022. TAAA/iliac artery aneurysms. Endovascular stent graft 06/17/2022: Coil embolization secondary right internal iliac artery, left internal iliac artery.  Placement of  endoprosthesis.  Placement of 2 additional stents into the right hypogastric artery.  Mechanical thrombectomy left femoral artery.  Coil embolization left hypogastric aneurysm, right hypogastric aneurysm. COPD. Hypertension. Hyperlipidemia. Lipid panel 02/09/2024: LDL 42, HDL 64, TG 137, total 129. Chronic hepatitis C. Hyperthyroidism. Tobacco abuse. Alcohol use.  In summary, patient was hospitalized in 07/2016 with acute systolic CHF in the setting of A. fib with RVR.  Echocardiogram at that time showed an EF of 25-30%, moderate mitral regurgitation, and moderate pulmonary hypertension with a PASP of 50 mmHg.  He underwent right and left heart catheterization that showed no significant CAD, right heart catheterization showed a minimally elevated filling pressures and pulmonary hypertension.  Moderately reduced cardiac output. 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  He was rate controlled and started on Eliquis .  He was also noted to have apneic episodes during the cardiac catheterization with recommendation for outpatient sleep study.  He has been following in the [facility] CHF clinic.  Follow-up echocardiogram in 01/2017 showed moderate LVH, EF 30-35%, diffuse hypokinesis, unable to exclude regional wall motion abnormalities, moderately dilated aortic root at 47 mm, moderately dilated a sending aorta at 50 mm, moderate mitral regurgitation, severely dilated left atrium measuring 58 mm, mildly dilated RV with RV systolic function mildly to moderately reduced, severely dilated right atrium, mildly to moderately elevated pulmonary arterial pressure in the range of 40-45 mmHg.   Patient underwent hospital admission in February 2019 when he was woken up with substernal chest pain and a sensation that he could not catch his breath. Pain was rated 7/10. Pain began to then radiate down to his legs. Patient reports this pain was similar to his pain back in 07/2016 admission, though worse.  He reported  compliance with his medications, though endorsed dietary indiscretions. Stable 2-pillow orthopnea. Upon his arrival to [facility], he was initially noted to be hypotensive with blood pressure of 88/59, heart rate 90 bpm, afebrile, oxygen saturation 99% on room air, weight 260 pounds.  Troponin negative x2.  D-dimer elevated at 1024 with CTA chest negative for PE.  CTA chest did show ascending thoracic aorta aneurysm measuring 5.0 cm in AP dimension.  Chest x-ray without any active cardiopulmonary disease with mild cardiomegaly.  White blood cell count 6.5, hemoglobin 16.4, platelet count 237.  Sodium 122, potassium 3.0 with hemolysis noted, glucose 141, BUN 20, serum creatinine 1.29.  EKG showed A. fib, 99 bpm, LVH with nonspecific IVCD, prolonged QT, poor R wave progression, nonspecific ST-T changes.  Blood pressure has improved to 107/75.  In the ED he was given a DuoNeb and Ativan .  Echo demonstrated EF 30 to 35%, mild concentric LVH, diffuse hypokinesis, mild MR, mild BAE, mildly elevated PA pressure 40 mmHg.  Repeat echo October 2022 demonstrated EF 30%, global hypokinesis, mildly dilated LV internal cavity, mild LVH, low normal RV function, severe BAE, mild MR, severe dilatation of aortic root and ascending aorta measuring 55 mm.  He had been referred to cardiothoracic surgery but no-showed his appointment.  He was lost to follow-up since June 2022.  He presented to [facility] on 06/14/2022 with a 3 to 4-week history of increasing shortness of breath that became acutely worse 2 days prior to admission.  No frank chest pain.  In this setting, he has also noted increased lower extremity swelling and intermittent orthopnea.  He reports intermittent adherence to medications and does report he sometimes takes a blood thinner.  Initial high-sensitivity troponin 25 with a delta troponin of 28.  BNP 575.  CTA of the chest/abdomen/pelvis showed no acute findings or aortic dissection/interval hematoma/convincing  penetrating ulcer. There was no change when compared to CT from 07/2021.  Dilated ascending thoracic aorta was noted with prominent descending portion stable.  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  Imaging also demonstrated gallbladder distention with an indistinct wall and adjacent hazy fat infiltration.  Follow-up MRCP showed potential sludge in the gallbladder with thickened gallbladder wall and edematous with pericholecystic fluid.  GI planning for conservative management with outpatient follow-up.  With regards to aneurysms as outlined above, patient has been evaluated by vascular surgery with plans to undergo stent graft aneurysm repair with coil embolization of the left internal iliac artery under general anesthesia.  Patient underwent aneurysm repair on 06/17/2022.  He was cardioverted x 2 on 06/23/2022 on 06/25/2022. Echo during hospital admission demonstrated EF 20 to 25%.  Right heart cath in April 2024 demonstrated mildly elevated RA pressure, mild pulmonary venous hypertension, mildly decreased cardiac output.  Patient underwent cardioversion on 09/29/2022.  He had an ICD placed on 11/03/2022.  Patient was evaluated in the office in July 2025 with complaints of chest pain and shortness of breath.  He had an abnormal cardiac PET/CT in June prior to his visit.  He underwent R/LHC which demonstrated nonobstructive CAD and moderately elevated right heart pressures and moderate pulmonary hypertension as detailed above.  Patient was last seen in the office by Cadence Franchester, PA-C  on 12/30/2023 for follow-up after heart cath.  He underwent preop evaluation prior to extraction of 9 teeth.  He was  found to be an acceptable risk with about further cardiac testing.     History of Present Illness    Today, patient ***  Nonobstructive CAD Cardiac PET/CT June 2025 demonstrated findings consistent with small area of inferior apical infarction with peri-infarct ischemia.  [facility] July 2025 demonstrated minor irregularities with no evidence of obstructive CAD.  Patient*** - Continue atorvastatin , Toprol , as needed SL NTG. -***  Chronic HFrEF/pulmonary hypertension S/p ICD implantation May 2024.  Remote device check August 2025 showed normal device function with stable battery and lead parameters and appropriate device programming.  Patient*** Euvolemic and well compensated on exam. -Continue Jardiance , Toprol , Entresto , spironolactone .  PAF DCCV in January 2024 x 2 and April 2024 x 1.  Denies spontaneous bleeding concerns.  Patient***EKG*** - Continue digoxin , Toprol , Eliquis .  TAA/iliac artery aneurysm S/p endovascular stent graft January 2024.  Patient is being followed by vascular surgery.  Plan for repeat CTA in 2026. - Continue to follow with vascular surgery***  Hypertension BP today*** - Continue Entresto , spironolactone , Toprol .  Hyperlipidemia LDL 42 September 2025, at goal. - Continue atorvastatin .  Tobacco abuse Patient*** -***  Alcohol use Patient*** -***  ROS: All other systems reviewed and are otherwise negative except as noted in History of Present Illness.  EKGs/Labs Reviewed        02/09/2024: ALT 11; AST 14; BUN 16; Creatinine, Ser 1.09; Potassium 4.4; Sodium 134   02/09/2024: Hemoglobin 13.8; WBC 4.2   02/09/2024: TSH 3.810   04/22/2023: B Natriuretic Peptide 228.5  ***  Risk Assessment/Calculations    {Does this patient have ATRIAL FIBRILLATION?:[id number]} No BP recorded.  {Refresh Note OR Click here to enter BP  :1}***        Physical Exam    VS:  There were no vitals taken for this visit. , BMI There is no height or weight on file to  calculate BMI.  GEN: Well nourished, well developed, in no acute distress. Neck: No JVD or carotid bruits. Cardiac: *** RRR. *** No murmur. No rubs or gallops.   Respiratory:  Respirations regular and unlabored. Clear to auscultation without rales, wheezing or rhonchi. GI: Soft, nontender, nondistended. Extremities: Radials/DP/PT 2+ and equal bilaterally. No clubbing or cyanosis. No edema ***  Skin: Warm and dry, no rash. Neuro: Strength intact.  Assessment & Plan   ***  Disposition: ***     {Are you ordering a CV Procedure (e.g. stress test, cath, DCCV, TEE, etc)?   Press F2        :[id number]}   Signed, Barnie HERO. Vaishnav Demartin, DNP, NP-C

## 2024-03-02 ENCOUNTER — Ambulatory Visit: Attending: Student | Admitting: Student

## 2024-03-02 NOTE — Telephone Encounter (Signed)
 Called and spoke with daughter per DPR. Patient scheduled to see Cadence Furth on 03/06/24.

## 2024-03-06 ENCOUNTER — Ambulatory Visit: Attending: Medical | Admitting: Medical

## 2024-03-06 ENCOUNTER — Encounter: Payer: Self-pay | Admitting: Medical

## 2024-03-06 VITALS — BP 120/80 | HR 89 | Ht 73.0 in | Wt 283.2 lb

## 2024-03-06 DIAGNOSIS — I429 Cardiomyopathy, unspecified: Secondary | ICD-10-CM

## 2024-03-06 DIAGNOSIS — E782 Mixed hyperlipidemia: Secondary | ICD-10-CM | POA: Diagnosis not present

## 2024-03-06 DIAGNOSIS — R0602 Shortness of breath: Secondary | ICD-10-CM

## 2024-03-06 DIAGNOSIS — F1010 Alcohol abuse, uncomplicated: Secondary | ICD-10-CM

## 2024-03-06 DIAGNOSIS — I10 Essential (primary) hypertension: Secondary | ICD-10-CM

## 2024-03-06 DIAGNOSIS — Z720 Tobacco use: Secondary | ICD-10-CM | POA: Diagnosis not present

## 2024-03-06 DIAGNOSIS — I4821 Permanent atrial fibrillation: Secondary | ICD-10-CM

## 2024-03-06 DIAGNOSIS — I5022 Chronic systolic (congestive) heart failure: Secondary | ICD-10-CM

## 2024-03-06 NOTE — Progress Notes (Signed)
 Cardiology Office Note   Date:  03/06/2024  ID:  Jarryd Gratz, DOB 02-04-1951, MRN [id number] PCP: Louder Duwaine SQUIBB, DO  [facility] Providers Cardiologist:  Deatrice Cage, MD Electrophysiologist:  OLE ONEIDA HOLTS, MD    History of Present Illness Viggo Perko is a 73 y.o. male with a h/o NICM based on cath in 2018, HTN, DM2, HFrEF s/p ICD, permanent Afib, tobacco use and alcohol use who presents for 6-month follow-up of CHF.   He was admitted 06/14/22 with SOB and chest pain. He was noted to be in afib RVR and underwent DCCV to NSR. 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 Echo 06/2022 showed LVEF 20-25% with severe RV dysfunction. Cardiac MRI 2/24 showed LVEF 17%, RVEF 36%, small area of lateral wall subendocardial LGE, mid-wall LGE at this inferoseptal RV insertion site. There was a very small area of possible prior MI, but this study suggested primarily NICM.    RHC 09/2022 showed normal filling pressures with mildly decreased CI at 2.2. he failed DCCV on amiodarone  09/2022. Biotronik ICD was placed 10/2022.    He has been following with heart failure and EP. He saw Dr. HOLTS 08/2023 reporting chest pain. PET stress was ordered which was abnormal partially reversible location. EF 30-35%. He was asked to come in to discuss heart cath. R/L heart cath 12/16/23 showed minor irregularities with no evidence of CAD. RHC showed moderately elevated right atrial pressure, moderate pulmonary HTN and normal CO.  The patient was last seen 12/30/2023 for preop evaluation of needing 9 teeth removed.  No further cardiac workup was recommended.  Patient was still drinking 6-8 beers weekly and smoking 3 cigarettes weekly.  Today, the patient reports he did not get his teeth pulled due to financial reasons. Has been having arthritis with the cold weather. He reports worsening  SOB. He has not noticed lower leg edema. He also reports brief chest pains when he feels SOB. He is still drinking two 6 packs a week. He is smoking 2 packs a months.   Studies Reviewed EKG Interpretation Date/Time:  Tuesday March 06 2024 09:13:04 EDT Ventricular Rate:  89 PR Interval:    QRS Duration:  104 QT Interval:  374 QTC Calculation: 455 R Axis:   -49  Text Interpretation: Atrial fibrillation with premature ventricular or aberrantly conducted complexes Left anterior fascicular block Inferior infarct , age undetermined Cannot rule out Anterior infarct (cited on or before 15-Sep-2022) When compared with ECG of 30-Dec-2023 14:42, Nonspecific T wave abnormality, worse in Lateral leads Confirmed by Franchester, Abhi Moccia ([id number]) on 03/06/2024 9:19:21 AM    R/L heart cath 12/2023 1.  Minor irregularities with no evidence of obstructive coronary artery disease. 2.  Left ventricular angiography was not performed.  EF is known to be moderately to severely reduced. 3.  Right heart catheterization showed moderately elevated right atrial pressure, moderate pulmonary hypertension and normal cardiac output.   RA: 14 mmHg RV: 49/11 mmHg PW: 15 mmHg PA: 51/23 with a mean of 35 mmHg Cardiac output is 6.24 with an index of 2.51.   Recommendations: The patient has nonischemic cardiomyopathy.  Recommend medical therapy. Avoid catheterization via the right radial artery in the future due to tortuous innominate artery and significantly dilated aortic root.  Engaging the left main coronary artery was very challenging.     Cardiac PET stress test 11/2023   Findings  are consistent with small area of inferoapical infarction with peri-infarct ischemia. The study is intermediate risk due to reduced EF - consider correlation with echo.   ECG rhythm shows atrial fibrillation which may affect accuracy of gated data such as volumes and EF.   No ST deviation was noted.   LV perfusion is abnormal. Defect 1: There  is a small defect with mild reduction in uptake present in the apical to mid inferior location(s) that is partially reversible. There is abnormal wall motion in the defect area. Consistent with infarction and peri-infarct ischemia.   Rest left ventricular function is abnormal. Rest global function is moderately reduced. Rest EF: 30%. Stress left ventricular function is abnormal. Stress global function is moderately reduced. Stress EF: 36%. End diastolic cavity size is severely enlarged. End systolic cavity size is severely enlarged. No evidence of transient ischemic dilation (TID) noted.   Myocardial blood flow was computed to be 0.31ml/g/min at rest and 1.47ml/g/min at stress. Global myocardial blood flow reserve was 2.04 and was normal.   Coronary calcium  was present on the attenuation correction CT images. Mild coronary calcifications were present. Coronary calcifications were present in the left anterior descending artery, left circumflex artery and right coronary artery distribution(s).   RHC 09/2022 1. Mildly elevated RA pressure, normal PCWP.  2. Mild pulmonary venous hypertension.  3. Mildly decreased cardiac output (CI 2.22), preserved PAPi.    No changes to medication regimen at this time.     CMRI 09/2022 IMPRESSION: 1.  Severely reduced LV function, LVEF 17%.   2. Small lateral wall subendocardial LGE (scar) indicating possible prior infarct.   3. Mid wall LGE (scar) in the LV basal-mid septal wall at RV insertion points suggesting non ischemic etiology.   4.  No diffuse myocardial scar or infiltrative disease noted.   5.  Moderately dilated ascending aorta, 47 mm in diameter.   6.  Moderately reduced RV function.   7.  Findings suggest non ischemic cardiomyopathy.   Echo TEE 06/2022 1. Left ventricular ejection fraction, by estimation, is 20 to 25%. The  left ventricle has severely decreased function. The left ventricle  demonstrates global hypokinesis. The left ventricular  internal cavity size  was mildly dilated. Left ventricular  diastolic function could not be evaluated.   2. Right ventricular systolic function is moderately reduced. The right  ventricular size is moderately enlarged. Mildly increased right  ventricular wall thickness.   3. Left atrial size was moderately dilated. No left atrial/left atrial  appendage thrombus was detected.   4. Right atrial size was severely dilated.   5. There is a very small fibrinous echodensity on the atrial side of the  mitral valve measuring 0.9cm; this is unlikely to represent endocarditis.  . The mitral valve is normal in structure. Moderate to severe mitral valve  regurgitation. No evidence of  mitral stenosis.   6. The aortic valve is grossly normal. Aortic valve regurgitation is  mild.    Echo 06/2022 1. Left ventricular ejection fraction, by estimation, is 20 to 25%. The  left ventricle has severely decreased function. The left ventricle  demonstrates global hypokinesis. The left ventricular internal cavity size  was severely dilated. There is mild  left ventricular hypertrophy. Left ventricular diastolic function could  not be evaluated.   2. Right ventricular systolic function is severely reduced. The right  ventricular size is mildly enlarged. There is moderately elevated  pulmonary artery systolic pressure.   3. Left atrial size was severely dilated.  4. Right atrial size was severely dilated.   5. The mitral valve is normal in structure. Moderate mitral valve  regurgitation.   6. Tricuspid valve regurgitation is mild to moderate.   7. The aortic valve is tricuspid. Aortic valve regurgitation is trivial.  No aortic stenosis is present.   8. Aortic dilatation noted. 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 There is severe dilatation of the ascending aorta,  measuring 50 mm.   9. The inferior vena cava is dilated in size with <50% respiratory  variability, suggesting right  atrial pressure of 15 mmHg.    Echo 03/2021 1. Left ventricular ejection fraction, by estimation, is 30%. The left  ventricle has severely decreased function. The left ventricle demonstrates  global hypokinesis. The left ventricular internal cavity size was mildly  dilated. There is mild left  ventricular hypertrophy. Left ventricular diastolic parameters are  indeterminate.   2. Right ventricular systolic function is low normal. The right  ventricular size is normal.   3. Left atrial size was severely dilated.   4. Right atrial size was severely dilated.   5. The mitral valve is normal in structure. Mild mitral valve  regurgitation.   6. The aortic valve was not well visualized. Aortic valve regurgitation  is not visualized.   7. Aortic dilatation noted. 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      R/L heart cath 2018 1. No significant coronary artery disease. 2. Severely reduced LV systolic function by echo. Left ventricular angiography was not performed.  3. Right heart catheterization showed minimally elevated filling pressures and pulmonary hypertension. Moderately reduced cardiac output. RA pressure: 12 mmHg, RV pressure: 40 over 6 mmHg, PA pressure 37/12 with a mean of 12 mmHg. Pulmonary capillary wedge pressure was 12. Cardiac output was 4.16 with a cardiac index of 1.77.   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 His volume status appears to be close to normal. Thus, I switched furosemide  to 40 mg by mouth twice daily. Ventricular rate is still not optimally controlled and thus I increased Toprol  to 200 mg once daily. Start long-term anticoagulation with Eliquis  5 mg twice daily starting tomorrow.  The patient was noted to have apnea episodes during cardiac catheterization. Recommend outpatient evaluation for sleep apnea. Avoid catheterization via the right radial artery in the  future due to tortuous innominate artery and significantly dilated aortic root.       Physical Exam VS:  BP 120/80 (BP Location: Left Arm, Patient Position: Sitting, Cuff Size: Normal)   Pulse 89   Ht 6' 1 (1.854 m)   Wt 283 lb 3.2 oz (128.5 kg)   SpO2 98%   BMI 37.36 kg/m        Wt Readings from Last 3 Encounters:  03/06/24 283 lb 3.2 oz (128.5 kg)  01/04/24 280 lb 6.4 oz (127.2 kg)  12/30/23 274 lb 9.6 oz (124.6 kg)    GEN: Well nourished, well developed in no acute distress NECK: No JVD; No carotid bruits CARDIAC: RRR, no murmurs, rubs, gallops RESPIRATORY:  Clear to auscultation without rales, wheezing or rhonchi  ABDOMEN: Soft, non-tender, non-distended EXTREMITIES:  No edema; No deformity   ASSESSMENT AND PLAN  SOB Chronic systolic heart failure NICM s/p ICD Patient reports worsening SOB with associated chest tightness when he exerts himself. He appears euvolemic on exam. R/L heart cath 12/2023 showed minor irregularities with no  obstructive coronary artery disease.  Right heart cath showed moderately elevated right atrial pressure, moderate pulmonary hypertension and normal cardiac output.  I will refer patient to pulmonology.  I will repeat an echocardiogram.  Continue digoxin  0.125 mg half tablet every day, Jardiance  10 mg daily, Toprol  150 mg daily, Entresto  49-51 mg twice daily, spironolactone  25 mg daily.  HTN BP today is good, continue current medications.   HLD LDL 23. Continue Lipitor 40mg  daily.   Permanent A-fib EKG shows rate controlled A-fib.  Continue Eliquis  5 mg twice daily for stroke prophylaxis and Toprol  for rate control.  Alcohol use Patient is drinking two 6 packs a week.   Tobacco use Patient is smoking 2 packs per month.     Dispo: Follow-up in 3 months  Signed, Renuka Farfan VEAR Fishman, PA-C

## 2024-03-06 NOTE — Patient Instructions (Signed)
 Medication Instructions:  Your physician recommends that you continue on your current medications as directed. Please refer to the Current Medication list given to you today.    *If you need a refill on your cardiac medications before your next appointment, please call your pharmacy*  Lab Work: Your provider would like for you to have following labs drawn today CBC, BNP.     Testing/Procedures: Your physician has requested that you have an echocardiogram. Echocardiography is a painless test that uses sound waves to create images of your heart. It provides your doctor with information about the size and shape of your heart and how well your heart's chambers and valves are working.   You may receive an ultrasound enhancing agent through an IV if needed to better visualize your heart during the echo. This procedure takes approximately one hour.  There are no restrictions for this procedure.  This will take place at [address] [facility]) [address], [place] [address]  Please note: We ask at that you not bring children with you during ultrasound (echo/ vascular) testing. Due to room size and safety concerns, children are not allowed in the ultrasound rooms during exams. Our front office staff cannot provide observation of children in our lobby area while testing is being conducted. An adult accompanying a patient to their appointment will only be allowed in the ultrasound room at the discretion of the ultrasound technician under special circumstances. We apologize for any inconvenience.   Follow-Up: At [facility], you and your health needs are our priority.  As part of our continuing mission to provide you with exceptional heart care, our providers are all part of one team.  This team includes your primary Cardiologist (physician) and Advanced Practice Providers or APPs (Physician Assistants and Nurse Practitioners) who all work together to provide you with the care you  need, when you need it.  Your next appointment:   3 month(s)  Provider:   Deatrice Cage, MD or Cadence Franchester, PA-C

## 2024-03-07 ENCOUNTER — Ambulatory Visit: Payer: Self-pay | Admitting: Medical

## 2024-03-09 ENCOUNTER — Other Ambulatory Visit: Payer: Self-pay | Admitting: Family Medicine

## 2024-03-09 LAB — CBC
Hematocrit: 43.1 % (ref 37.5–51.0)
Hemoglobin: 14.5 g/dL (ref 13.0–17.7)
MCH: 28.8 pg (ref 26.6–33.0)
MCHC: 33.6 g/dL (ref 31.5–35.7)
MCV: 86 fL (ref 79–97)
Platelets: 207 x10E3/uL (ref 150–450)
RBC: 5.04 x10E6/uL (ref 4.14–5.80)
RDW: 16 % — ABNORMAL HIGH (ref 11.6–15.4)
WBC: 4 x10E3/uL (ref 3.4–10.8)

## 2024-03-09 LAB — BRAIN NATRIURETIC PEPTIDE: BNP: 37.6 pg/mL (ref 0.0–100.0)

## 2024-03-09 NOTE — Telephone Encounter (Signed)
 Requested Prescriptions  Pending Prescriptions Disp Refills   potassium chloride  SA (KLOR-CON  M) 20 MEQ tablet [Pharmacy Med Name: POTASSIUM CL ER 20 MEQ TAB MCR] 90 tablet 0    Sig: TAKE 1 TABLET BY MOUTH EVERY DAY     Endocrinology:  Minerals - Potassium Supplementation Passed - 03/09/2024  4:01 PM      Passed - K in normal range and within 360 days    Potassium  Date Value Ref Range Status  02/09/2024 4.4 3.5 - 5.2 mmol/L Final         Passed - Cr in normal range and within 360 days    Creatinine, Ser  Date Value Ref Range Status  02/09/2024 1.09 0.76 - 1.27 mg/dL Final         Passed - Valid encounter within last 12 months    Recent Outpatient Visits           4 weeks ago Encounter for [employer] annual wellness exam   [place] [facility] Eek, [place], DO   2 months ago Dysuria   [place] [facility] Everlene Parris LABOR, MD   2 months ago Wheezing   [place] [facility] [place], [place] P, DO   4 months ago Diabetes mellitus with cardiac complication [facility])   [place] [facility] [place], Megan P, DO   7 months ago Acute cystitis with hematuria   [place] [facility] Vicci Duwaine SQUIBB, DO       Future Appointments             In 2 months Furth, Cadence H, PA-C [facility] [place]

## 2024-03-12 DIAGNOSIS — M1990 Unspecified osteoarthritis, unspecified site: Secondary | ICD-10-CM | POA: Diagnosis not present

## 2024-03-12 DIAGNOSIS — L97511 Non-pressure chronic ulcer of other part of right foot limited to breakdown of skin: Secondary | ICD-10-CM | POA: Diagnosis not present

## 2024-03-12 DIAGNOSIS — I509 Heart failure, unspecified: Secondary | ICD-10-CM | POA: Diagnosis not present

## 2024-03-12 DIAGNOSIS — I252 Old myocardial infarction: Secondary | ICD-10-CM | POA: Diagnosis not present

## 2024-03-12 DIAGNOSIS — H9191 Unspecified hearing loss, right ear: Secondary | ICD-10-CM | POA: Diagnosis not present

## 2024-03-12 DIAGNOSIS — Z9989 Dependence on other enabling machines and devices: Secondary | ICD-10-CM | POA: Diagnosis not present

## 2024-03-12 DIAGNOSIS — I25119 Atherosclerotic heart disease of native coronary artery with unspecified angina pectoris: Secondary | ICD-10-CM | POA: Diagnosis not present

## 2024-03-12 DIAGNOSIS — Z87440 Personal history of urinary (tract) infections: Secondary | ICD-10-CM | POA: Diagnosis not present

## 2024-03-12 DIAGNOSIS — K5900 Constipation, unspecified: Secondary | ICD-10-CM | POA: Diagnosis not present

## 2024-03-12 DIAGNOSIS — Z7901 Long term (current) use of anticoagulants: Secondary | ICD-10-CM | POA: Diagnosis not present

## 2024-03-12 DIAGNOSIS — Z91199 Patient's noncompliance with other medical treatment and regimen due to unspecified reason: Secondary | ICD-10-CM | POA: Diagnosis not present

## 2024-03-12 DIAGNOSIS — Z818 Family history of other mental and behavioral disorders: Secondary | ICD-10-CM | POA: Diagnosis not present

## 2024-03-12 DIAGNOSIS — B182 Chronic viral hepatitis C: Secondary | ICD-10-CM | POA: Diagnosis not present

## 2024-03-12 DIAGNOSIS — J449 Chronic obstructive pulmonary disease, unspecified: Secondary | ICD-10-CM | POA: Diagnosis not present

## 2024-03-12 DIAGNOSIS — N182 Chronic kidney disease, stage 2 (mild): Secondary | ICD-10-CM | POA: Diagnosis not present

## 2024-03-12 DIAGNOSIS — I429 Cardiomyopathy, unspecified: Secondary | ICD-10-CM | POA: Diagnosis not present

## 2024-03-12 DIAGNOSIS — E11621 Type 2 diabetes mellitus with foot ulcer: Secondary | ICD-10-CM | POA: Diagnosis not present

## 2024-03-12 DIAGNOSIS — E785 Hyperlipidemia, unspecified: Secondary | ICD-10-CM | POA: Diagnosis not present

## 2024-03-12 DIAGNOSIS — Z8673 Personal history of transient ischemic attack (TIA), and cerebral infarction without residual deficits: Secondary | ICD-10-CM | POA: Diagnosis not present

## 2024-03-12 DIAGNOSIS — G252 Other specified forms of tremor: Secondary | ICD-10-CM | POA: Diagnosis not present

## 2024-03-12 DIAGNOSIS — K219 Gastro-esophageal reflux disease without esophagitis: Secondary | ICD-10-CM | POA: Diagnosis not present

## 2024-03-12 DIAGNOSIS — I130 Hypertensive heart and chronic kidney disease with heart failure and stage 1 through stage 4 chronic kidney disease, or unspecified chronic kidney disease: Secondary | ICD-10-CM | POA: Diagnosis not present

## 2024-03-12 DIAGNOSIS — Z5982 Transportation insecurity: Secondary | ICD-10-CM | POA: Diagnosis not present

## 2024-03-12 DIAGNOSIS — Z833 Family history of diabetes mellitus: Secondary | ICD-10-CM | POA: Diagnosis not present

## 2024-03-12 DIAGNOSIS — F17210 Nicotine dependence, cigarettes, uncomplicated: Secondary | ICD-10-CM | POA: Diagnosis not present

## 2024-03-12 DIAGNOSIS — E539 Vitamin B deficiency, unspecified: Secondary | ICD-10-CM | POA: Diagnosis not present

## 2024-03-12 DIAGNOSIS — Z5941 Food insecurity: Secondary | ICD-10-CM | POA: Diagnosis not present

## 2024-03-12 DIAGNOSIS — M545 Low back pain, unspecified: Secondary | ICD-10-CM | POA: Diagnosis not present

## 2024-03-12 DIAGNOSIS — F1010 Alcohol abuse, uncomplicated: Secondary | ICD-10-CM | POA: Diagnosis not present

## 2024-03-12 DIAGNOSIS — Z8249 Family history of ischemic heart disease and other diseases of the circulatory system: Secondary | ICD-10-CM | POA: Diagnosis not present

## 2024-03-12 DIAGNOSIS — D6869 Other thrombophilia: Secondary | ICD-10-CM | POA: Diagnosis not present

## 2024-03-12 DIAGNOSIS — I4891 Unspecified atrial fibrillation: Secondary | ICD-10-CM | POA: Diagnosis not present

## 2024-03-12 DIAGNOSIS — I719 Aortic aneurysm of unspecified site, without rupture: Secondary | ICD-10-CM | POA: Diagnosis not present

## 2024-03-15 ENCOUNTER — Other Ambulatory Visit: Payer: Self-pay | Admitting: Cardiology

## 2024-03-16 ENCOUNTER — Ambulatory Visit: Admitting: Family Medicine

## 2024-03-20 ENCOUNTER — Ambulatory Visit (INDEPENDENT_AMBULATORY_CARE_PROVIDER_SITE_OTHER): Admitting: Family Medicine

## 2024-03-20 ENCOUNTER — Encounter: Payer: Self-pay | Admitting: Family Medicine

## 2024-03-20 VITALS — BP 122/83 | HR 70 | Temp 97.5°F | Ht 73.0 in | Wt 284.6 lb

## 2024-03-20 DIAGNOSIS — I5021 Acute systolic (congestive) heart failure: Secondary | ICD-10-CM

## 2024-03-20 DIAGNOSIS — I714 Abdominal aortic aneurysm, without rupture, unspecified: Secondary | ICD-10-CM | POA: Diagnosis not present

## 2024-03-20 DIAGNOSIS — I5022 Chronic systolic (congestive) heart failure: Secondary | ICD-10-CM

## 2024-03-20 DIAGNOSIS — I428 Other cardiomyopathies: Secondary | ICD-10-CM

## 2024-03-20 DIAGNOSIS — G8929 Other chronic pain: Secondary | ICD-10-CM

## 2024-03-20 DIAGNOSIS — M25561 Pain in right knee: Secondary | ICD-10-CM | POA: Diagnosis not present

## 2024-03-20 DIAGNOSIS — M545 Low back pain, unspecified: Secondary | ICD-10-CM

## 2024-03-20 DIAGNOSIS — I10 Essential (primary) hypertension: Secondary | ICD-10-CM | POA: Diagnosis not present

## 2024-03-20 DIAGNOSIS — Z23 Encounter for immunization: Secondary | ICD-10-CM | POA: Diagnosis not present

## 2024-03-20 DIAGNOSIS — M1992 Post-traumatic osteoarthritis, unspecified site: Secondary | ICD-10-CM | POA: Diagnosis not present

## 2024-03-20 DIAGNOSIS — E1159 Type 2 diabetes mellitus with other circulatory complications: Secondary | ICD-10-CM

## 2024-03-20 DIAGNOSIS — I480 Paroxysmal atrial fibrillation: Secondary | ICD-10-CM

## 2024-03-20 NOTE — Assessment & Plan Note (Signed)
 Referral to pain management placed today. Call with any concerns.

## 2024-03-20 NOTE — Progress Notes (Signed)
 BP 122/83   Pulse 70   Temp (!) 97.5 F (36.4 C) (Oral)   Ht 6' 1 (1.854 m)   Wt 284 lb 9.6 oz (129.1 kg)   SpO2 97%   BMI 37.55 kg/m    Subjective:    Patient ID: Eugene Henderson, male    DOB: [birth year], 73 y.o.   MRN: [id number]  HPI: Eugene Henderson is a 73 y.o. male  Chief Complaint  Patient presents with   Hypertension   Diabetes   DIABETES- been on the 3mg  for about 3 weeks, tolerating medicine well with no issues Hypoglycemic episodes:no Polydipsia/polyuria: no Visual disturbance: no Chest pain: yes Paresthesias: no Glucose Monitoring: no Taking Insulin?: no Blood Pressure Monitoring: a few times a month Retinal Examination: Up to Date Foot Exam: Up to Date Diabetic Education: Completed Pneumovax: Up to Date Influenza: Up to Date Aspirin : no  Family is having a lot more trouble caring for him. Not able to cook- so he's eating more premade meals higher in sodium. Bus takes him to his appointments, but nothing to other activities. They are having trouble keeping up with his appointments and referrals and need additional help.   Continues with pain in his back and knee. Was seeing ortho who recommended PT, but he notes that he can't walk to go to the bathroom, so can't do PT. They would like a referral to pain management. No other concerns or complaints at this time.   Relevant past medical, surgical, family and social history reviewed and updated as indicated. Interim medical history since our last visit reviewed. Allergies and medications reviewed and updated.  Review of Systems  Constitutional: Negative.   Respiratory: Negative.    Cardiovascular: Negative.   Musculoskeletal:  Positive for arthralgias, back pain and myalgias. Negative for gait problem, joint swelling, neck pain and neck stiffness.  Skin: Negative.   Neurological: Negative.   Psychiatric/Behavioral: Negative.      Per HPI unless specifically indicated above     Objective:    BP  122/83   Pulse 70   Temp (!) 97.5 F (36.4 C) (Oral)   Ht 6' 1 (1.854 m)   Wt 284 lb 9.6 oz (129.1 kg)   SpO2 97%   BMI 37.55 kg/m   Wt Readings from Last 3 Encounters:  03/20/24 284 lb 9.6 oz (129.1 kg)  03/06/24 283 lb 3.2 oz (128.5 kg)  01/04/24 280 lb 6.4 oz (127.2 kg)    Physical Exam Vitals and nursing note reviewed.  Constitutional:      General: He is not in acute distress.    Appearance: Normal appearance. He is obese. He is not ill-appearing, toxic-appearing or diaphoretic.  HENT:     Head: Normocephalic and atraumatic.     Right Ear: External ear normal.     Left Ear: External ear normal.     Nose: Nose normal.     Mouth/Throat:     Mouth: Mucous membranes are moist.     Pharynx: Oropharynx is clear.  Eyes:     General: No scleral icterus.       Right eye: No discharge.        Left eye: No discharge.     Extraocular Movements: Extraocular movements intact.     Conjunctiva/sclera: Conjunctivae normal.     Pupils: Pupils are equal, round, and reactive to light.  Cardiovascular:     Rate and Rhythm: Normal rate and regular rhythm.     Pulses: Normal pulses.  Heart sounds: Normal heart sounds. No murmur heard.    No friction rub. No gallop.  Pulmonary:     Effort: Pulmonary effort is normal. No respiratory distress.     Breath sounds: Normal breath sounds. No stridor. No wheezing, rhonchi or rales.  Chest:     Chest wall: No tenderness.  Musculoskeletal:        General: Normal range of motion.     Cervical back: Normal range of motion and neck supple.  Skin:    General: Skin is warm and dry.     Capillary Refill: Capillary refill takes less than 2 seconds.     Coloration: Skin is not jaundiced or pale.     Findings: No bruising, erythema, lesion or rash.  Neurological:     General: No focal deficit present.     Mental Status: He is alert and oriented to person, place, and time. Mental status is at baseline.  Psychiatric:        Mood and Affect: Mood  normal.        Behavior: Behavior normal.        Thought Content: Thought content normal.        Judgment: Judgment normal.     Results for orders placed or performed in visit on 03/06/24  Brain natriuretic peptide   Collection Time: 03/06/24  9:51 AM  Result Value Ref Range   BNP 37.6 0.0 - 100.0 pg/mL  CBC   Collection Time: 03/06/24  9:51 AM  Result Value Ref Range   WBC 4.0 3.4 - 10.8 x10E3/uL   RBC 5.04 4.14 - 5.80 x10E6/uL   Hemoglobin 14.5 13.0 - 17.7 g/dL   Hematocrit 56.8 62.4 - 51.0 %   MCV 86 79 - 97 fL   MCH 28.8 26.6 - 33.0 pg   MCHC 33.6 31.5 - 35.7 g/dL   RDW 83.9 (H) 88.3 - 84.5 %   Platelets 207 150 - 450 x10E3/uL      Assessment & Plan:   Problem List Items Addressed This Visit       Cardiovascular and Mediastinum   Chronic systolic heart failure (HCC) (Chronic)   Relevant Orders   AMB Referral VBCI Care Management   Essential hypertension   Relevant Orders   AMB Referral VBCI Care Management   AAA (abdominal aortic aneurysm)   Relevant Orders   AMB Referral VBCI Care Management   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 Continue current regimen. Recheck 6 weeks with A1c.      Relevant Orders   AMB Referral VBCI Care Management   Acute systolic heart failure (HCC)   Relevant Orders   AMB Referral VBCI Care Management   AF (paroxysmal atrial fibrillation) (HCC)   Relevant Orders   AMB Referral VBCI Care Management   NICM (nonischemic cardiomyopathy) (HCC)   Relevant Orders   AMB Referral VBCI Care Management     Musculoskeletal and Integument   Post-traumatic osteoarthritis   Referral to pain management placed today. Call with any concerns.       Relevant Orders   Ambulatory referral to Pain Clinic     Other   Chronic low back pain   Referral to pain management placed today. Call with any concerns.       Relevant Orders   Ambulatory referral to Pain Clinic   Chronic pain of  right knee   Referral to pain management placed today. Call with any concerns.  Relevant Orders   Ambulatory referral to Pain Clinic   Other Visit Diagnoses       Need for COVID-19 vaccine       Relevant Orders   Pfizer Comirnaty Covid -19 Vaccine 39yrs and older        Follow up plan: Return in about 7 weeks (around 05/10/2024).

## 2024-03-20 NOTE — Assessment & Plan Note (Signed)
 Tolerating his rybelsus  well. Continue current regimen. Recheck 6 weeks with A1c.

## 2024-03-21 ENCOUNTER — Telehealth: Payer: Self-pay

## 2024-03-21 NOTE — Progress Notes (Signed)
 Complex Care Management Note  Care Guide Note 03/21/2024 Name: Eugene Henderson MRN: [id number] DOB: [birth year]  Eugene Henderson is a 72 y.o. year old male who sees Lukis, Bunt, DO for primary care. I reached out to Lamar Louder by phone today to offer complex care management services.  Eugene Henderson was given information about Complex Care Management services today including:   The Complex Care Management services include support from the care team which includes your Nurse Care Manager, Clinical Social Worker, or Pharmacist.  The Complex Care Management team is here to help remove barriers to the health concerns and goals most important to you. Complex Care Management services are voluntary, and the patient may decline or stop services at any time by request to their care team member.   Complex Care Management Consent Status: Patient agreed to services and verbal consent obtained.   Follow up plan:  Telephone appointment with complex care management team member scheduled for:  [facility] 03/26/2024 LCSW 04/12/2024  Encounter Outcome:  Patient Scheduled  .Debbe Fuse [facility], [facility] Guide  Direct Dial: [phone]  Fax [phone]

## 2024-03-22 ENCOUNTER — Telehealth: Payer: Self-pay

## 2024-03-22 NOTE — Progress Notes (Signed)
 Complex Care Management Note Care Guide Note  03/22/2024 Name: Eugene Henderson MRN: [id number] DOB: 10/07/1950   Complex Care Management Outreach Attempts: An unsuccessful telephone outreach was attempted today to offer the patient information about available complex care management services.  Follow Up Plan:  Additional outreach attempts will be made to offer the patient complex care management information and services.   Encounter Outcome:  No Answer  Jett Fukuda [facility]  [facility] Guide Direct Dial: [phone]  Fax: [phone] Website: [email or web address]

## 2024-03-23 ENCOUNTER — Other Ambulatory Visit: Payer: Self-pay | Admitting: Cardiology

## 2024-03-26 ENCOUNTER — Ambulatory Visit: Admitting: Family Medicine

## 2024-03-26 ENCOUNTER — Telehealth: Payer: Self-pay

## 2024-03-26 ENCOUNTER — Encounter: Payer: Self-pay | Admitting: Family Medicine

## 2024-03-26 DIAGNOSIS — E559 Vitamin D deficiency, unspecified: Secondary | ICD-10-CM | POA: Diagnosis not present

## 2024-03-26 DIAGNOSIS — Z79899 Other long term (current) drug therapy: Secondary | ICD-10-CM | POA: Diagnosis not present

## 2024-03-26 DIAGNOSIS — G8929 Other chronic pain: Secondary | ICD-10-CM | POA: Diagnosis not present

## 2024-03-26 DIAGNOSIS — M25561 Pain in right knee: Secondary | ICD-10-CM | POA: Diagnosis not present

## 2024-03-26 DIAGNOSIS — Z6838 Body mass index (BMI) 38.0-38.9, adult: Secondary | ICD-10-CM | POA: Diagnosis not present

## 2024-03-26 DIAGNOSIS — E6609 Other obesity due to excess calories: Secondary | ICD-10-CM | POA: Diagnosis not present

## 2024-03-26 DIAGNOSIS — E119 Type 2 diabetes mellitus without complications: Secondary | ICD-10-CM | POA: Diagnosis not present

## 2024-03-26 DIAGNOSIS — M129 Arthropathy, unspecified: Secondary | ICD-10-CM | POA: Diagnosis not present

## 2024-03-26 DIAGNOSIS — M545 Low back pain, unspecified: Secondary | ICD-10-CM | POA: Diagnosis not present

## 2024-03-26 DIAGNOSIS — Z794 Long term (current) use of insulin: Secondary | ICD-10-CM | POA: Diagnosis not present

## 2024-03-26 DIAGNOSIS — E7800 Pure hypercholesterolemia, unspecified: Secondary | ICD-10-CM | POA: Diagnosis not present

## 2024-03-26 DIAGNOSIS — R030 Elevated blood-pressure reading, without diagnosis of hypertension: Secondary | ICD-10-CM | POA: Diagnosis not present

## 2024-03-26 DIAGNOSIS — R0602 Shortness of breath: Secondary | ICD-10-CM | POA: Diagnosis not present

## 2024-03-26 NOTE — Progress Notes (Signed)
 Complex Care Management Note Care Guide Note  03/26/2024 Name: Eugene Henderson MRN: [id number] DOB: [birth year]   Complex Care Management Outreach Attempts: A second unsuccessful outreach was attempted today to offer the patient with information about available complex care management services.  Follow Up Plan:  Additional outreach attempts will be made to offer the patient complex care management information and services.   Encounter Outcome:  No Answer  Kimberlynn Lumbra [facility]  [facility] Guide Direct Dial: [phone]  Fax: [phone] Website: [email or web address]

## 2024-03-27 ENCOUNTER — Telehealth: Payer: Self-pay

## 2024-03-27 NOTE — Progress Notes (Signed)
 Complex Care Management Note Care Guide Note  03/27/2024 Name: [name] MRN: [id number] DOB: 25-May-1951  [name] is a 73 y.o. year old male who is a primary care patient of Starsky, Nanna, DO . The community resource team was consulted for assistance with Transportation Needs  and Food Insecurity  SDOH screenings and interventions completed:  Yes  Social Drivers of Health From This Encounter   Food Insecurity: No Food Insecurity (03/27/2024)   Hunger Vital Sign    Worried About Running Out of Food in the Last Year: Never true    Ran Out of Food in the Last Year: Never true  Housing: Low Risk  (03/27/2024)   Housing Stability Vital Sign    Unable to Pay for Housing in the Last Year: No    Number of Times Moved in the Last Year: 0    Homeless in the Last Year: No  Financial Resource Strain: Low Risk  (03/27/2024)   Overall Financial Resource Strain (CARDIA)    Difficulty of Paying Living Expenses: Not very hard  Transportation Needs: Unmet Transportation Needs (03/27/2024)   PRAPARE - Administrator, Civil Service (Medical): Yes    Lack of Transportation (Non-Medical): Yes  Utilities: Not At Risk (03/27/2024)   Utilities    Threatened with loss of utilities: No    SDOH Interventions Today    Flowsheet Row Most Recent Value  SDOH Interventions   Food Insecurity Interventions Other (Comment)  [employer] to list to patient's home address.]  Transportation Interventions Other (Comment), Payor Benefit  [Patient is currently using Media planner for appointments and [employer]. Explained that Medicaid and [employer] both require 2-3 days notice to schedule rides. They are unable to provide transportation without prior notice.]     Care guide performed the following interventions: Spoke with patient's daughter Angeline [place] per [facility] 08/31/22. Patient is receiving SNAP benefits but the amount has been reduced. They are appealing  this.  Follow Up Plan:  No further follow up planned at this time. The patient has been provided with needed resources.  Encounter Outcome:  Patient Visit Completed  Vincenzo Stave [facility]  [facility] Guide Direct Dial: [phone]  Fax: [phone] Website: [email or web address]

## 2024-03-29 ENCOUNTER — Telehealth: Payer: Self-pay

## 2024-03-29 DIAGNOSIS — M545 Low back pain, unspecified: Secondary | ICD-10-CM | POA: Diagnosis not present

## 2024-03-29 DIAGNOSIS — M25552 Pain in left hip: Secondary | ICD-10-CM | POA: Diagnosis not present

## 2024-03-29 DIAGNOSIS — M25561 Pain in right knee: Secondary | ICD-10-CM | POA: Diagnosis not present

## 2024-03-29 DIAGNOSIS — M25551 Pain in right hip: Secondary | ICD-10-CM | POA: Diagnosis not present

## 2024-03-29 DIAGNOSIS — M546 Pain in thoracic spine: Secondary | ICD-10-CM | POA: Diagnosis not present

## 2024-03-30 ENCOUNTER — Telehealth: Payer: Self-pay

## 2024-04-03 ENCOUNTER — Ambulatory Visit: Admitting: Family Medicine

## 2024-04-05 ENCOUNTER — Other Ambulatory Visit: Payer: Self-pay | Admitting: Family Medicine

## 2024-04-06 ENCOUNTER — Encounter: Admitting: Student in an Organized Health Care Education/Training Program

## 2024-04-06 NOTE — Telephone Encounter (Signed)
 Requested Prescriptions  Pending Prescriptions Disp Refills   empagliflozin  (JARDIANCE ) 10 MG TABS tablet [Pharmacy Med Name: JARDIANCE  10 MG Oral Tablet] 90 tablet 1    Sig: TAKE 1 TABLET EVERY DAY     Endocrinology:  Diabetes - SGLT2 Inhibitors Passed - 04/06/2024  2:19 PM      Passed - Cr in normal range and within 360 days    Creatinine, Ser  Date Value Ref Range Status  02/09/2024 1.09 0.76 - 1.27 mg/dL Final         Passed - HBA1C is between 0 and 7.9 and within 180 days    HB A1C (BAYER DCA - WAIVED)  Date Value Ref Range Status  02/09/2024 6.9 (H) 4.8 - 5.6 % Final    Comment:             Prediabetes: 5.7 - 6.4          Diabetes: >6.4          Glycemic control for adults with diabetes: <7.0          Passed - eGFR in normal range and within 360 days    GFR calc Af Amer  Date Value Ref Range Status  03/28/2020 96 >59 mL/min/1.73 Final    Comment:    **In accordance with recommendations from the NKF-ASN Task force,**   [facility] is in the process of updating its eGFR calculation to the   2021 CKD-EPI creatinine equation that estimates kidney function   without a race variable.    GFR, Estimated  Date Value Ref Range Status  04/22/2023 >60 >60 mL/min Final    Comment:    (NOTE) Calculated using the CKD-EPI Creatinine Equation (2021)    eGFR  Date Value Ref Range Status  02/09/2024 72 >59 mL/min/1.73 Final         Passed - Valid encounter within last 6 months    Recent Outpatient Visits           2 weeks ago Diabetes mellitus with cardiac complication [facility])   [place] [facility] [place], Megan P, DO   1 month ago Encounter for [employer] annual wellness exam   [facility] [place], [place], DO   3 months ago Dysuria   [place] [facility] Everlene Parris LABOR, MD   3 months ago Wheezing   [place] [facility] [place], [place] P, DO   4 months ago Diabetes mellitus with cardiac  complication [facility])   [place] [facility] Vicci Duwaine SQUIBB, DO       Future Appointments             In 2 months Furth, Cadence H, PA-C [facility] [place]

## 2024-04-09 ENCOUNTER — Ambulatory Visit: Admitting: Medical

## 2024-04-11 DIAGNOSIS — E119 Type 2 diabetes mellitus without complications: Secondary | ICD-10-CM | POA: Diagnosis not present

## 2024-04-11 DIAGNOSIS — G8929 Other chronic pain: Secondary | ICD-10-CM | POA: Diagnosis not present

## 2024-04-11 DIAGNOSIS — F17210 Nicotine dependence, cigarettes, uncomplicated: Secondary | ICD-10-CM | POA: Diagnosis not present

## 2024-04-11 DIAGNOSIS — R300 Dysuria: Secondary | ICD-10-CM | POA: Diagnosis not present

## 2024-04-11 DIAGNOSIS — Z79899 Other long term (current) drug therapy: Secondary | ICD-10-CM | POA: Diagnosis not present

## 2024-04-11 DIAGNOSIS — E559 Vitamin D deficiency, unspecified: Secondary | ICD-10-CM | POA: Diagnosis not present

## 2024-04-11 DIAGNOSIS — Z794 Long term (current) use of insulin: Secondary | ICD-10-CM | POA: Diagnosis not present

## 2024-04-11 DIAGNOSIS — Z6836 Body mass index (BMI) 36.0-36.9, adult: Secondary | ICD-10-CM | POA: Diagnosis not present

## 2024-04-11 DIAGNOSIS — M545 Low back pain, unspecified: Secondary | ICD-10-CM | POA: Diagnosis not present

## 2024-04-12 ENCOUNTER — Other Ambulatory Visit: Payer: Self-pay | Admitting: *Deleted

## 2024-04-12 ENCOUNTER — Telehealth: Payer: Self-pay | Admitting: Family Medicine

## 2024-04-12 ENCOUNTER — Observation Stay
Admission: EM | Admit: 2024-04-12 | Discharge: 2024-04-14 | Disposition: A | Discharge status: Home / Self Care | Attending: Hospitalist | Admitting: Hospitalist

## 2024-04-12 ENCOUNTER — Other Ambulatory Visit: Payer: Self-pay

## 2024-04-12 DIAGNOSIS — I5022 Chronic systolic (congestive) heart failure: Secondary | ICD-10-CM | POA: Diagnosis not present

## 2024-04-12 DIAGNOSIS — F17210 Nicotine dependence, cigarettes, uncomplicated: Secondary | ICD-10-CM | POA: Diagnosis not present

## 2024-04-12 DIAGNOSIS — Z79899 Other long term (current) drug therapy: Secondary | ICD-10-CM | POA: Diagnosis not present

## 2024-04-12 DIAGNOSIS — I7123 Aneurysm of the descending thoracic aorta, without rupture: Secondary | ICD-10-CM | POA: Insufficient documentation

## 2024-04-12 DIAGNOSIS — R55 Syncope and collapse: Principal | ICD-10-CM | POA: Insufficient documentation

## 2024-04-12 DIAGNOSIS — I110 Hypertensive heart disease with heart failure: Secondary | ICD-10-CM | POA: Insufficient documentation

## 2024-04-12 DIAGNOSIS — G4733 Obstructive sleep apnea (adult) (pediatric): Secondary | ICD-10-CM | POA: Diagnosis not present

## 2024-04-12 DIAGNOSIS — R197 Diarrhea, unspecified: Secondary | ICD-10-CM | POA: Diagnosis not present

## 2024-04-12 DIAGNOSIS — F10920 Alcohol use, unspecified with intoxication, uncomplicated: Secondary | ICD-10-CM | POA: Diagnosis not present

## 2024-04-12 DIAGNOSIS — J449 Chronic obstructive pulmonary disease, unspecified: Secondary | ICD-10-CM | POA: Diagnosis present

## 2024-04-12 DIAGNOSIS — I951 Orthostatic hypotension: Secondary | ICD-10-CM

## 2024-04-12 DIAGNOSIS — I10 Essential (primary) hypertension: Secondary | ICD-10-CM | POA: Diagnosis present

## 2024-04-12 DIAGNOSIS — E0590 Thyrotoxicosis, unspecified without thyrotoxic crisis or storm: Secondary | ICD-10-CM | POA: Diagnosis present

## 2024-04-12 DIAGNOSIS — I4819 Other persistent atrial fibrillation: Secondary | ICD-10-CM | POA: Diagnosis not present

## 2024-04-12 DIAGNOSIS — I713 Abdominal aortic aneurysm, ruptured, unspecified: Secondary | ICD-10-CM | POA: Insufficient documentation

## 2024-04-12 DIAGNOSIS — R42 Dizziness and giddiness: Secondary | ICD-10-CM

## 2024-04-12 DIAGNOSIS — N390 Urinary tract infection, site not specified: Secondary | ICD-10-CM | POA: Diagnosis not present

## 2024-04-12 DIAGNOSIS — I714 Abdominal aortic aneurysm, without rupture, unspecified: Secondary | ICD-10-CM | POA: Diagnosis present

## 2024-04-12 LAB — COMPREHENSIVE METABOLIC PANEL WITH GFR
ALT: 20 U/L (ref 0–44)
AST: 24 U/L (ref 15–41)
Albumin: 4.1 g/dL (ref 3.5–5.0)
Alkaline Phosphatase: 86 U/L (ref 38–126)
Anion gap: 13 (ref 5–15)
BUN: 9 mg/dL (ref 8–23)
CO2: 22 mmol/L (ref 22–32)
Calcium: 9.1 mg/dL (ref 8.9–10.3)
Chloride: 95 mmol/L — ABNORMAL LOW (ref 98–111)
Creatinine, Ser: 1.21 mg/dL (ref 0.61–1.24)
GFR, Estimated: >60 mL/min (ref >60)
Glucose, Bld: 106 mg/dL — ABNORMAL HIGH (ref 70–99)
Potassium: 3.9 mmol/L (ref 3.5–5.1)
Sodium: 130 mmol/L — ABNORMAL LOW (ref 135–145)
Total Bilirubin: 1.3 mg/dL — ABNORMAL HIGH (ref 0.0–1.2)
Total Protein: 7.8 g/dL (ref 6.5–8.1)

## 2024-04-12 LAB — CBC
HCT: 41.3 % (ref 39.0–52.0)
Hemoglobin: 14.1 g/dL (ref 13.0–17.0)
MCH: 29 pg (ref 26.0–34.0)
MCHC: 34.1 g/dL (ref 30.0–36.0)
MCV: 85 fL (ref 80.0–100.0)
Platelets: 174 K/uL (ref 150–400)
RBC: 4.86 MIL/uL (ref 4.22–5.81)
RDW: 14.9 % (ref 11.5–15.5)
WBC: 3.8 K/uL — ABNORMAL LOW (ref 4.0–10.5)
nRBC: 0 % (ref 0.0–0.2)

## 2024-04-12 MED ORDER — BLOOD GLUCOSE TEST VI STRP: 1.0000 | ORAL_STRIP | 0 refills | Status: AC

## 2024-04-12 MED ORDER — LANCET DEVICE MISC: 1.0000 | 0 refills | Status: AC

## 2024-04-12 MED ORDER — ACETAMINOPHEN 325 MG PO TABS
650.0000 mg | ORAL_TABLET | Freq: Once | ORAL | Status: AC
  Administered 2024-04-12: 650 mg via ORAL
  Filled 2024-04-12: qty 2

## 2024-04-12 MED ORDER — BLOOD GLUCOSE MONITORING SUPPL DEVI: 1.0000 | 0 refills | Status: AC

## 2024-04-12 MED ORDER — SODIUM CHLORIDE 0.9 % IV BOLUS
1000.0000 mL | Freq: Once | INTRAVENOUS | Status: AC
Start: 2024-04-12 — End: 2024-04-12
  Administered 2024-04-12: 1000 mL via INTRAVENOUS

## 2024-04-12 MED ORDER — LANCETS MISC: 1.0000 | 0 refills | Status: AC

## 2024-04-12 NOTE — Telephone Encounter (Signed)
 Copied from CRM [id number]. Topic: Clinical - Order For Equipment >> Apr 12, 2024 10:24 AM Leonette SQUIBB wrote: Reason for CRM: pt's daughter is called asking for the meter strips and lancets.  [employer] guide or true metrics per insurance.    That will need to be sent to his local pharmacy,  [facility] [address]  Daughter name is Sari [phone]

## 2024-04-12 NOTE — Telephone Encounter (Signed)
 That is fine. Go ahead and pend the orders and I will sign them off.

## 2024-04-12 NOTE — ED Notes (Signed)
 Orthostatic Vital Signs   04/12/24 2150 04/12/24 2155 04/12/24 2200  Vitals  BP (!) 88/71 (!) 80/71 (!) 84/55  MAP (mmHg) 78 75 (!) 63  Patient Position (if appropriate) Lying Sitting Standing  Pulse Rate 92 87 90  MEWS COLOR  MEWS Score Color Green Yellow Green  Oxygen Therapy  SpO2 100 % 100 % 100 %  MEWS Score  MEWS Temp 0 0 0  MEWS Systolic 1 2 1   MEWS Pulse 0 0 0  MEWS RR 0 0 0  MEWS LOC 0 0 0  MEWS Score 1 2 1

## 2024-04-12 NOTE — Telephone Encounter (Signed)
 I signed them off.

## 2024-04-12 NOTE — Patient Outreach (Signed)
 Initial assessment started, however patient needed to be rushed to the emergency room from the pain clinic today. Initial appointment re-scheduled for 04/16/24 at 12pm.   Lenn Mean, LCSW [place]  [facility] Licensed Clinical Social Worker  Direct Dial: [phone]

## 2024-04-12 NOTE — ED Triage Notes (Signed)
 Patient states he had routine labs yesterday and was called today stating Na was low. Patient reports generalized weakness.

## 2024-04-12 NOTE — ED Provider Notes (Signed)
 [facility] Provider Note    Event Date/Time   First MD Initiated Contact with Patient 04/12/24 2040     (approximate)   History   Abnormal Labs   HPI  Eugene Henderson is a 73 y.o. male with a history of paroxysmal atrial fibrillation, CHF, diabetes, and hypertension who presents with concern for hyponatremia.  The patient states that he had blood drawn at his management doctors office, and was called today and told that his sodium level was very low and that he needed to come to the hospital for evaluation.  He does not remember the number although he thinks it had a 9 in it.  He states it was definitely the sodium.  The patient denies any acute symptoms.  He states he occasionally feels a bit lightheaded when he stands up, but this is not new.  He denies any cramping or weakness.  He does report some nerve type pain in various areas, and has chronic back pain but with no acute changes.  He otherwise is feeling fine.  Reviewed the past medical records.  The patient's most recent outpatient visit was on 10/14 with family medicine for follow-up of his chronic conditions.   Physical Exam   Triage Vital Signs: ED Triage Vitals  Encounter Vitals Group     BP 04/12/24 1608 98/61     Girls Systolic BP Percentile --      Girls Diastolic BP Percentile --      Boys Systolic BP Percentile --      Boys Diastolic BP Percentile --      Pulse Rate 04/12/24 1608 68     Resp 04/12/24 1608 18     Temp 04/12/24 1608 98.2 F (36.8 C)     Temp Source 04/12/24 1608 Oral     SpO2 04/12/24 1608 100 %     Weight 04/12/24 1606 281 lb (127.5 kg)     Height 04/12/24 1606 6' (1.829 m)     Head Circumference --      Peak Flow --      Pain Score 04/12/24 1606 0     Pain Loc --      Pain Education --      Exclude from Growth Chart --     Most recent vital signs: Vitals:   04/12/24 2155 04/12/24 2200  BP: (!) 80/71 (!) 84/55  Pulse: 87 90  Resp:    Temp:    SpO2: 100%  100%     General: Alert, comfortable appearing, no distress.  CV:  Good peripheral perfusion.  Resp:  Normal effort.  Abd:  No distention.  Other:  Motor intact in all extremities.  Moist mucous membranes.   ED Results / Procedures / Treatments   Labs (all labs ordered are listed, but only abnormal results are displayed) Labs Reviewed  COMPREHENSIVE METABOLIC PANEL WITH GFR - Abnormal; Notable for the following components:      Result Value   Sodium 130 (*)    Chloride 95 (*)    Glucose, Bld 106 (*)    Total Bilirubin 1.3 (*)    All other components within normal limits  CBC - Abnormal; Notable for the following components:   WBC 3.8 (*)    All other components within normal limits  URINALYSIS, ROUTINE W REFLEX MICROSCOPIC  CBG MONITORING, ED     EKG  ED ECG REPORT I, Waylon Cassis, the attending physician, personally viewed and interpreted this ECG.  Date: 04/12/2024  EKG Time: 1612 Rate: 73 Rhythm: Atrial fibrillation QRS Axis: normal Intervals: LAFB ST/T Wave abnormalities: Nonspecific ST abnormalities Narrative Interpretation: no evidence of acute ischemia    RADIOLOGY    PROCEDURES:  Critical Care performed: No  Procedures   MEDICATIONS ORDERED IN ED: Medications  acetaminophen  (TYLENOL ) tablet 650 mg (650 mg Oral Given 04/12/24 2235)  sodium chloride  0.9 % bolus 1,000 mL (1,000 mLs Intravenous New Bag/Given 04/12/24 2235)     IMPRESSION / MDM / ASSESSMENT AND PLAN / ED COURSE  I reviewed the triage vital signs and the nursing notes.  73 year old male with PMH as noted above presents with concern for hyponatremia found on outpatient labs.  He has no acute symptoms.  Differential diagnosis includes, but is not limited to, hyponatremia.  However, lab workup here is unremarkable.  CMP shows a sodium of 130 and no other acute findings.  CBC is also within normal limits.  Overall I suspect that there may have been a spurious result with the  prior test.  Especially given that the patient is asymptomatic, he is appropriate for discharge home.  His blood pressure is borderline low, but he is not having any acute lightheadedness and does not appear hypoperfused.  Patient's presentation is most consistent with acute complicated illness / injury requiring diagnostic workup.  The patient is on the cardiac monitor to evaluate for evidence of arrhythmia and/or significant heart rate changes.   ----------------------------------------- 11:13 PM on 04/12/2024 -----------------------------------------  The patient had significant orthostatic hypotension although is still minimally symptomatic and appears quite well.  We have started a liter of saline.  As long as his blood pressure improves anticipate he will be able to go home.  If he continues to be hypotensive he may need admission for further management.  I have signed him out to the oncoming ED physician Dr. Waymond.   FINAL CLINICAL IMPRESSION(S) / ED DIAGNOSES   Final diagnoses:  Orthostatic hypotension     Rx / DC Orders   ED Discharge Orders     None        Note:  This document was prepared using Dragon voice recognition software and may include unintentional dictation errors.    Jacolyn Pae, MD 04/12/24 2314

## 2024-04-13 DIAGNOSIS — R55 Syncope and collapse: Principal | ICD-10-CM

## 2024-04-13 DIAGNOSIS — I951 Orthostatic hypotension: Secondary | ICD-10-CM

## 2024-04-13 DIAGNOSIS — R42 Dizziness and giddiness: Secondary | ICD-10-CM | POA: Diagnosis not present

## 2024-04-13 LAB — URINALYSIS, ROUTINE W REFLEX MICROSCOPIC
Bilirubin Urine: NEGATIVE
Glucose, UA: >=500 mg/dL — AB
Ketones, ur: NEGATIVE mg/dL
Nitrite: NEGATIVE
Protein, ur: NEGATIVE mg/dL
RBC / HPF: >50 RBC/hpf (ref 0–5)
Specific Gravity, Urine: 1.01 (ref 1.005–1.030)
WBC, UA: >50 WBC/hpf (ref 0–5)
pH: 5 (ref 5.0–8.0)

## 2024-04-13 LAB — LACTIC ACID, PLASMA
Lactic Acid, Venous: 1.6 mmol/L (ref 0.5–1.9)
Lactic Acid, Venous: 1.7 mmol/L (ref 0.5–1.9)

## 2024-04-13 LAB — TROPONIN I (HIGH SENSITIVITY)
Troponin I (High Sensitivity): 10 ng/L (ref <18)
Troponin I (High Sensitivity): 9 ng/L (ref <18)

## 2024-04-13 MED ORDER — TRAMADOL HCL 50 MG PO TABS
50.0000 mg | ORAL_TABLET | Freq: Four times a day (QID) | ORAL | Status: DC | PRN
  Administered 2024-04-13 – 2024-04-14 (×3): 50 mg via ORAL
  Filled 2024-04-13 (×3): qty 1

## 2024-04-13 MED ORDER — PANTOPRAZOLE SODIUM 40 MG PO TBEC
40.0000 mg | DELAYED_RELEASE_TABLET | Freq: Two times a day (BID) | ORAL | Status: DC
  Administered 2024-04-13 – 2024-04-14 (×4): 40 mg via ORAL
  Filled 2024-04-13 (×4): qty 1

## 2024-04-13 MED ORDER — MORPHINE SULFATE (PF) 2 MG/ML IV SOLN: 2.0000 mg | INTRAVENOUS | Status: DC | PRN

## 2024-04-13 MED ORDER — POTASSIUM CHLORIDE CRYS ER 20 MEQ PO TBCR
20.0000 meq | EXTENDED_RELEASE_TABLET | Freq: Every day | ORAL | Status: DC
  Administered 2024-04-13 – 2024-04-14 (×2): 20 meq via ORAL
  Filled 2024-04-13 (×2): qty 1

## 2024-04-13 MED ORDER — EMPAGLIFLOZIN 10 MG PO TABS
10.0000 mg | ORAL_TABLET | Freq: Every day | ORAL | Status: DC
  Administered 2024-04-13 – 2024-04-14 (×2): 10 mg via ORAL
  Filled 2024-04-13 (×2): qty 1

## 2024-04-13 MED ORDER — SODIUM CHLORIDE 0.9% FLUSH
3.0000 mL | Freq: Two times a day (BID) | INTRAVENOUS | Status: DC
  Administered 2024-04-13 (×3): 3 mL via INTRAVENOUS

## 2024-04-13 MED ORDER — ONDANSETRON HCL 4 MG/2ML IJ SOLN: 4.0000 mg | Freq: Four times a day (QID) | INTRAMUSCULAR | Status: DC | PRN

## 2024-04-13 MED ORDER — ORAL CARE MOUTH RINSE
15.0000 mL | OROMUCOSAL | Status: DC | PRN
Start: 2024-04-13 — End: 2024-04-14

## 2024-04-13 MED ORDER — SODIUM CHLORIDE 0.9 % IV SOLN: INTRAVENOUS | Status: AC

## 2024-04-13 MED ORDER — APIXABAN 5 MG PO TABS
5.0000 mg | ORAL_TABLET | Freq: Two times a day (BID) | ORAL | Status: DC
  Administered 2024-04-13 – 2024-04-14 (×4): 5 mg via ORAL
  Filled 2024-04-13 (×4): qty 1

## 2024-04-13 MED ORDER — ACETAMINOPHEN 325 MG PO TABS
650.0000 mg | ORAL_TABLET | Freq: Four times a day (QID) | ORAL | Status: DC | PRN
  Administered 2024-04-13 – 2024-04-14 (×4): 650 mg via ORAL
  Filled 2024-04-13 (×4): qty 2

## 2024-04-13 MED ORDER — ACETAMINOPHEN 650 MG RE SUPP
650.0000 mg | Freq: Four times a day (QID) | RECTAL | Status: DC | PRN
Start: 2024-04-13 — End: 2024-04-14

## 2024-04-13 MED ORDER — SODIUM CHLORIDE 0.9 % IV SOLN: 1.0000 g | INTRAVENOUS | Status: DC

## 2024-04-13 MED ORDER — ATORVASTATIN CALCIUM 20 MG PO TABS
40.0000 mg | ORAL_TABLET | Freq: Every day | ORAL | Status: DC
Start: 2024-04-13 — End: 2024-04-14
  Administered 2024-04-13 – 2024-04-14 (×2): 40 mg via ORAL
  Filled 2024-04-13 (×2): qty 2

## 2024-04-13 MED ORDER — NITROGLYCERIN 0.4 MG SL SUBL: 0.4000 mg | SUBLINGUAL_TABLET | SUBLINGUAL | Status: DC | PRN

## 2024-04-13 MED ORDER — OXYCODONE HCL 5 MG PO TABS
5.0000 mg | ORAL_TABLET | ORAL | Status: DC | PRN
  Administered 2024-04-13: 5 mg via ORAL
  Filled 2024-04-13: qty 1

## 2024-04-13 MED ORDER — SODIUM CHLORIDE 0.9 % IV SOLN
1.0000 g | Freq: Once | INTRAVENOUS | Status: AC
  Administered 2024-04-13: 1 g via INTRAVENOUS
  Filled 2024-04-13: qty 10

## 2024-04-13 MED ORDER — ALBUTEROL SULFATE (2.5 MG/3ML) 0.083% IN NEBU: 2.5000 mg | INHALATION_SOLUTION | Freq: Four times a day (QID) | RESPIRATORY_TRACT | Status: DC | PRN

## 2024-04-13 MED ORDER — SODIUM CHLORIDE 0.9 % IV BOLUS
1000.0000 mL | Freq: Once | INTRAVENOUS | Status: AC
  Administered 2024-04-13: 1000 mL via INTRAVENOUS

## 2024-04-13 MED ORDER — NITROFURANTOIN MONOHYD MACRO 100 MG PO CAPS
100.0000 mg | ORAL_CAPSULE | Freq: Two times a day (BID) | ORAL | Status: DC
  Administered 2024-04-13 – 2024-04-14 (×2): 100 mg via ORAL
  Filled 2024-04-13 (×3): qty 1

## 2024-04-13 MED ORDER — DIGOXIN 125 MCG PO TABS
62.5000 ug | ORAL_TABLET | Freq: Every day | ORAL | Status: DC
  Administered 2024-04-13 – 2024-04-14 (×2): 62.5 ug via ORAL
  Filled 2024-04-13 (×2): qty 0.5

## 2024-04-13 MED ORDER — ONDANSETRON HCL 4 MG PO TABS: 4.0000 mg | ORAL_TABLET | Freq: Four times a day (QID) | ORAL | Status: DC | PRN

## 2024-04-13 NOTE — Assessment & Plan Note (Signed)
 OSA/OHS Continue home inhalers with DuoNebs as needed CPAP nightly if desired

## 2024-04-13 NOTE — Assessment & Plan Note (Signed)
 Clinically dry Holding metoprolol , Entresto , spironolactone , furosemide  due to hypotension Daily weights Monitor for fluid overload in view of IV fluid hydration

## 2024-04-13 NOTE — Assessment & Plan Note (Addendum)
 Orthostatic hypotension Suspect secondary to hypotension related to volume loss from decreased oral intake, recent diarrhea and ongoing diuretic meds IV hydration with monitoring for fluid overload in view of systolic heart failure history Daily orthostatics Hold antihypertensives Neurologic checks

## 2024-04-13 NOTE — Hospital Course (Addendum)
 SABRA

## 2024-04-13 NOTE — Assessment & Plan Note (Addendum)
 Continue apixaban  and digoxin  Holding metoprolol ,

## 2024-04-13 NOTE — Assessment & Plan Note (Deleted)
 Suspect secondary to dehydration from recent diarrhea in association with antihypertensives including diuretics Hold antihypertensives IV hydration Monitor for fluid overload in view of systolic heart failure

## 2024-04-13 NOTE — Assessment & Plan Note (Signed)
 Thoracic aortic aneurysm Patient denies chest pain No acute issues suspected Will continue to monitor closely

## 2024-04-13 NOTE — Assessment & Plan Note (Signed)
 History of recurrent UTIs Rocephin  Follow culture

## 2024-04-13 NOTE — H&P (Signed)
 History and Physical    Patient: Eugene Henderson FMW:[id number] DOB: 1950/06/20 DOA: 04/12/2024 DOS: the patient was seen and examined on 04/13/2024 PCP: Louder Duwaine SQUIBB, DO  Patient coming from: Home  Chief Complaint:  Chief Complaint  Patient presents with   Abnormal Labs    HPI: Eugene Henderson is a 73 y.o. male with medical history significant for HTN, A-fib on Eliquis , HFrEF (EF 20 to 25% 06/2022), OSA/OHS, thoracic aneurysm and AAA, being admitted with urinary tract infection presenting with a near syncopal event and orthostatic hypotension in the setting of a 1 week history of diarrhea with decreased oral intake.  He has had no fever or chills but endorses urinary frequency. In the ED he was orthostatic with systolic dropping from 107 lying to 84 standing.  Other vitals were unremarkable Labs notable for mild leukopenia of 3.8, mild hyponatremia of 130.  Troponin and lactic acid normal.  Urinalysis with moderate leuks and many bacteria. EKG showed A-fib at 76 with nonspecific T wave abnormalities.  Patient treated with an NS bolus and started on Rocephin  Admission requested.     Past Medical History:  Diagnosis Date   Arthritis    Ascending aortic aneurysm    a. 09/2017 Stable TAA - 5.1cm.   Asthma    Chronic systolic CHF (congestive heart failure) (HCC)    a. EF 25-30% by echo in 07/2016 with cath showing no significant CAD b. 01/2017: EF 30-35% with diffuse HK and moderate MR; c. 07/2017 Echo: EF 30-35%, diff hK. Mild MR, mildly dil LA. PASP .   Diverticulitis    Diverticulitis of large intestine with perforation without abscess or bleeding 05/13/2017   Hypertension    Hyperthyroidism    NICM (nonischemic cardiomyopathy) (HCC)    Noncompliance    Persistent atrial fibrillation (HCC)    a. CHA2DS2VASc = 3-->Eliquis  (? compliance).   Past Surgical History:  Procedure Laterality Date   CARDIOVERSION N/A 06/23/2022   Procedure: CARDIOVERSION;  Surgeon: Gardenia Led, DO;  Location: [facility] ORS;  Service: Cardiovascular;  Laterality: N/A;   CARDIOVERSION N/A 06/25/2022   Procedure: CARDIOVERSION;  Surgeon: Gardenia Led, DO;  Location: [facility] ORS;  Service: Cardiovascular;  Laterality: N/A;   CARDIOVERSION N/A 09/29/2022   Procedure: CARDIOVERSION;  Surgeon: Rolan Ezra RAMAN, MD;  Location: [facility] INVASIVE CV LAB;  Service: Cardiovascular;  Laterality: N/A;   COLONOSCOPY N/A 08/27/2019   Procedure: COLONOSCOPY;  Surgeon: Toledo, Ladell POUR, MD;  Location: [facility] ENDOSCOPY;  Service: Gastroenterology;  Laterality: N/A;   ENDOVASCULAR STENT GRAFT (AAA) N/A 06/17/2022   Procedure: ENDOVASCULAR REPAIR/STENT GRAFT;  Surgeon: Marea Selinda RAMAN, MD;  Location: [facility] INVASIVE CV LAB;  Service: Cardiovascular;  Laterality: N/A;   ICD IMPLANT N/A 11/03/2022   Procedure: ICD IMPLANT;  Surgeon: Cindie Ole DASEN, MD;  Location: [facility] INVASIVE CV LAB;  Service: Cardiovascular;  Laterality: N/A;   JOINT REPLACEMENT Right    RIGHT HEART CATH Right 09/21/2022   Procedure: RIGHT HEART CATH;  Surgeon: Rolan Ezra RAMAN, MD;  Location: [facility] INVASIVE CV LAB;  Service: Cardiovascular;  Laterality: Right;   RIGHT/LEFT HEART CATH AND CORONARY ANGIOGRAPHY N/A 08/02/2016   Procedure: Right/Left Heart Cath and Coronary Angiography;  Surgeon: Deatrice DELENA Cage, MD;  Location: [facility] INVASIVE CV LAB;  Service: Cardiovascular;  Laterality: N/A;   RIGHT/LEFT HEART CATH AND CORONARY ANGIOGRAPHY Bilateral 12/16/2023   Procedure: RIGHT/LEFT HEART CATH AND CORONARY ANGIOGRAPHY;  Surgeon: Cage Deatrice DELENA, MD;  Location: [facility] INVASIVE CV LAB;  Service: Cardiovascular;  Laterality:  Bilateral;   TEE WITHOUT CARDIOVERSION N/A 06/23/2022   Procedure: TRANSESOPHAGEAL ECHOCARDIOGRAM;  Surgeon: Gardenia Led, DO;  Location: [facility] ORS;  Service: Cardiovascular;  Laterality: N/A;   TESTICLE SURGERY     Patient states that he had to have the tube fixed.   Social History:  reports that he has been smoking pipe and  cigarettes. He has never used smokeless tobacco. He reports current alcohol use of about 2.0 standard drinks of alcohol per week. He reports that he does not use drugs.  No Known Allergies  Family History  Problem Relation Age of Onset   Diabetes Mother    Diabetes Father    Heart disease Brother    Heart attack Brother    Diabetes Brother    Kidney failure Brother    Thyroid  disease Neg Hx     Prior to Admission medications   Medication Sig Start Date End Date Taking? Authorizing Provider  acetaminophen  (TYLENOL ) 500 MG tablet Take 1-2 tablets (500-1,000 mg total) by mouth every 6 (six) hours as needed for mild pain (pain score 1-3), fever or headache. Patient taking differently: Take 1,000 mg by mouth every 6 (six) hours as needed for mild pain (pain score 1-3), fever or headache. 04/12/23   Deroche, Megan P, DO  albuterol  (VENTOLIN  HFA) 108 (90 Base) MCG/ACT inhaler Inhale 1-2 puffs into the lungs every 6 (six) hours as needed for wheezing or shortness of breath. 11/09/23   Bechtol, Megan P, DO  atorvastatin  (LIPITOR) 40 MG tablet Take 1 tablet (40 mg total) by mouth daily. 11/09/23   Pinedo, Megan P, DO  Blood Glucose Monitoring Suppl DEVI 1 each by Does not apply route as directed. Dispense based on patient and insurance preference. Use up to four times daily as directed. (FOR ICD-10 E10.9, E11.9). 04/12/24   Melvin Pao, NP  Blood Pressure Monitoring (BLOOD PRESSURE CUFF) MISC Check blood pressure as instructed by your physician 12/08/23   Franchester, Cadence H, PA-C  digoxin  (LANOXIN ) 0.125 MG tablet TAKE 1/2 TABLET BY MOUTH EVERY DAY 11/07/23   Cindie Ole DASEN, MD  ELIQUIS  5 MG TABS tablet TAKE 1 TABLET BY MOUTH TWICE A DAY 12/14/23   Darron Deatrice LABOR, MD  empagliflozin  (JARDIANCE ) 10 MG TABS tablet TAKE 1 TABLET EVERY DAY 04/06/24   Cohenour, Megan P, DO  folic acid  (FOLVITE ) 1 MG tablet Take 1 tablet (1 mg total) by mouth daily. 11/09/23   Bessey, Megan P, DO  furosemide  (LASIX ) 40 MG  tablet Take 1 tablet (40 mg total) by mouth daily. 11/03/23   Cindie Ole DASEN, MD  Glucose Blood (BLOOD GLUCOSE TEST STRIPS) STRP 1 each by Does not apply route as directed. Dispense based on patient and insurance preference. Use up to four times daily as directed. (FOR ICD-10 E10.9, E11.9). 04/12/24   Melvin Pao, NP  Lancet Device MISC 1 each by Does not apply route as directed. Dispense based on patient and insurance preference. Use up to four times daily as directed. (FOR ICD-10 E10.9, E11.9). 04/12/24   Melvin Pao, NP  Lancets MISC 1 each by Does not apply route as directed. Dispense based on patient and insurance preference. Use up to four times daily as directed. (FOR ICD-10 E10.9, E11.9). 04/12/24   Melvin Pao, NP  metFORMIN  (GLUCOPHAGE ) 500 MG tablet Take 1 tablet (500 mg total) by mouth 2 (two) times daily with a meal. 11/09/23   Buehrer, Megan P, DO  metoprolol  succinate (TOPROL -XL) 50 MG 24 hr tablet Take 3  tablets (150 mg total) by mouth daily. Take with or immediately following a meal 03/16/24   Furth, Cadence H, PA-C  nitroGLYCERIN  (NITROSTAT ) 0.4 MG SL tablet Place 1 tablet (0.4 mg total) under the tongue every 5 (five) minutes as needed for chest pain. This may be repeated twice and please call 911 when taking the third dose. 12/08/23 03/20/24  Furth, Cadence H, PA-C  pantoprazole  (PROTONIX ) 40 MG tablet Take 1 tablet (40 mg total) by mouth 2 (two) times daily. 11/09/23   Bissonette, Megan P, DO  potassium chloride  SA (KLOR-CON  M) 20 MEQ tablet TAKE 1 TABLET BY MOUTH EVERY DAY 03/09/24   Deford, Megan P, DO  sacubitril -valsartan  (ENTRESTO ) 49-51 MG Take 1 tablet by mouth 2 (two) times daily. 12/08/23   Furth, Cadence H, PA-C  Semaglutide  (RYBELSUS ) 3 MG TABS Take 1 tablet (3 mg total) by mouth daily. 02/09/24   Lehrke, Megan P, DO  Semaglutide  (RYBELSUS ) 7 MG TABS Take 1 tablet (7 mg total) by mouth daily. 03/08/24   Sonnen, Megan P, DO  spironolactone  (ALDACTONE ) 25 MG tablet  Take 1 tablet (25 mg total) by mouth daily. 11/09/23   Vicci Bouchard P, DO  Tiotropium Bromide-Olodaterol (STIOLTO RESPIMAT ) 2.5-2.5 MCG/ACT AERS INHALE 2 PUFFS BY MOUTH INTO THE LUNGS DAILY 10/19/23   Isadora Hose, MD    Physical Exam: Vitals:   04/12/24 2330 04/13/24 0000 04/13/24 0030 04/13/24 0100  BP: 105/79 109/71 115/74 133/89  Pulse: 77 61 (!) 121 75  Resp: 18     Temp: (!) 97.5 F (36.4 C)     TempSrc: Oral     SpO2: 100% 100% 100% 100%  Weight:      Height:       Physical Exam Vitals and nursing note reviewed.  Constitutional:      General: He is not in acute distress. HENT:     Head: Normocephalic and atraumatic.  Cardiovascular:     Rate and Rhythm: Normal rate and regular rhythm.     Heart sounds: Normal heart sounds.  Pulmonary:     Effort: Pulmonary effort is normal.     Breath sounds: Normal breath sounds.  Abdominal:     Palpations: Abdomen is soft.     Tenderness: There is no abdominal tenderness.  Neurological:     Mental Status: Mental status is at baseline.     Labs on Admission: I have personally reviewed following labs and imaging studies  CBC: Recent Labs  Lab 04/12/24 1609  WBC 3.8*  HGB 14.1  HCT 41.3  MCV 85.0  PLT 174   Basic Metabolic Panel: Recent Labs  Lab 04/12/24 1609  NA 130*  K 3.9  CL 95*  CO2 22  GLUCOSE 106*  BUN 9  CREATININE 1.21  CALCIUM  9.1   GFR: Estimated Creatinine Clearance: 75.1 mL/min (by C-G formula based on SCr of 1.21 mg/dL). Liver Function Tests: Recent Labs  Lab 04/12/24 1609  AST 24  ALT 20  ALKPHOS 86  BILITOT 1.3*  PROT 7.8  ALBUMIN 4.1   No results for input(s): LIPASE, AMYLASE in the last 168 hours. No results for input(s): AMMONIA in the last 168 hours. Coagulation Profile: No results for input(s): INR, PROTIME in the last 168 hours. Cardiac Enzymes: No results for input(s): CKTOTAL, CKMB, CKMBINDEX, TROPONINI in the last 168 hours. BNP (last 3 results) No  results for input(s): PROBNP in the last 8760 hours. HbA1C: No results for input(s): HGBA1C in the last 72 hours. CBG: No  results for input(s): GLUCAP in the last 168 hours. Lipid Profile: No results for input(s): CHOL, HDL, LDLCALC, TRIG, CHOLHDL, LDLDIRECT in the last 72 hours. Thyroid  Function Tests: No results for input(s): TSH, T4TOTAL, FREET4, T3FREE, THYROIDAB in the last 72 hours. Anemia Panel: No results for input(s): VITAMINB12, FOLATE, FERRITIN, TIBC, IRON, RETICCTPCT in the last 72 hours. Urine analysis:    Component Value Date/Time   COLORURINE YELLOW (A) 04/13/2024 0025   APPEARANCEUR CLOUDY (A) 04/13/2024 0025   APPEARANCEUR Cloudy (A) 11/09/2023 0928   LABSPEC 1.010 04/13/2024 0025   PHURINE 5.0 04/13/2024 0025   GLUCOSEU >=500 (A) 04/13/2024 0025   HGBUR LARGE (A) 04/13/2024 0025   BILIRUBINUR NEGATIVE 04/13/2024 0025   BILIRUBINUR negative 01/04/2024 1545   BILIRUBINUR Negative 11/09/2023 0928   KETONESUR NEGATIVE 04/13/2024 0025   PROTEINUR NEGATIVE 04/13/2024 0025   UROBILINOGEN 0.2 01/04/2024 1545   NITRITE NEGATIVE 04/13/2024 0025   LEUKOCYTESUR MODERATE (A) 04/13/2024 0025    Radiological Exams on Admission: No results found. Data Reviewed for HPI: Relevant notes from primary care and specialist visits, past discharge summaries as available in EHR, including Care Everywhere. Prior diagnostic testing as pertinent to current admission diagnoses Updated medications and problem lists for reconciliation ED course, including vitals, labs, imaging, treatment and response to treatment Triage notes, nursing and pharmacy notes and ED provider's notes Notable results as noted above in HPI      Assessment and Plan: Postural dizziness with presyncope Orthostatic hypotension Suspect secondary to hypotension related to volume loss from decreased oral intake, recent diarrhea and ongoing diuretic meds IV hydration with  monitoring for fluid overload in view of systolic heart failure history Daily orthostatics Hold antihypertensives Neurologic checks  Urinary tract infection History of recurrent UTIs Rocephin  Follow culture  COPD (chronic obstructive pulmonary disease) (HCC) OSA/OHS Continue home inhalers with DuoNebs as needed CPAP nightly if desired  AAA (abdominal aortic aneurysm) Thoracic aortic aneurysm Patient denies chest pain No acute issues suspected Will continue to monitor closely  Chronic systolic heart failure (HCC) Clinically dry Holding metoprolol , Entresto , spironolactone , furosemide  due to hypotension Daily weights Monitor for fluid overload in view of IV fluid hydration  Persistent atrial fibrillation (HCC) Continue apixaban  and digoxin  Holding metoprolol ,     DVT prophylaxis: Eliquis   Consults: none  Advance Care Planning:   Code Status: Prior   Family Communication: none  Disposition Plan: Back to previous home environment  Severity of Illness: The appropriate patient status for this patient is OBSERVATION. Observation status is judged to be reasonable and necessary in order to provide the required intensity of service to ensure the patient's safety. The patient's presenting symptoms, physical exam findings, and initial radiographic and laboratory data in the context of their medical condition is felt to place them at decreased risk for further clinical deterioration. Furthermore, it is anticipated that the patient will be medically stable for discharge from the hospital within 2 midnights of admission.   Author: Delayne LULLA Solian, MD 04/13/2024 2:02 AM  For on call review [email or web address].

## 2024-04-13 NOTE — ED Provider Notes (Signed)
.-----------------------------------------   12:48 AM on 04/13/2024 -----------------------------------------  Blood pressure 109/71, pulse 61, temperature (!) 97.5 F (36.4 C), temperature source Oral, resp. rate 18, height 6' (1.829 m), weight 127.5 kg, SpO2 100%.  Assuming care from Dr. Jacolyn.  In short, Eugene Henderson is a 73 y.o. male with a chief complaint of Abnormal Labs .  Refer to the original H&P for additional details.  The current plan of care is to follow-up UA, reassessment after fluids.  On reassessment repeat blood pressure was systolic 100s.  On sitting and standing patient still felt lightheaded.  States that he has been having about a week of diarrhea, no abdominal pain, no nausea vomiting or fever.  States that he has also noted increased urinary frequency.  States that he has not had much appetite and has decreased p.o. intake in the last several days but has been taking his medications including his blood pressure medications.  Given his symptoms, positive UA, likely dehydration, will plan to admit him for IV antibiotics and continued hydration.  He is abdomen soft nontender, no flank or CVA tenderness.  Independent review of prior positive urine culture, he grew Klebsiella that was susceptible to ceftriaxone .  Will give him a dose here.  Also to add lactic acid and troponin.  Given his persistent lightheadedness, decreased p.o. intake secondary to diarrhea, dehydration, UTI, he will need to be admitted for further management.  Consulted hospitalist for admission and she will evaluate the patient.  He is admitted.         Waymond Lorelle Cummins, MD 04/13/24 [phone]

## 2024-04-14 ENCOUNTER — Other Ambulatory Visit: Payer: Self-pay

## 2024-04-14 DIAGNOSIS — R42 Dizziness and giddiness: Secondary | ICD-10-CM | POA: Diagnosis not present

## 2024-04-14 DIAGNOSIS — R55 Syncope and collapse: Secondary | ICD-10-CM | POA: Diagnosis not present

## 2024-04-14 LAB — URINE CULTURE: Culture: NO GROWTH

## 2024-04-14 LAB — BASIC METABOLIC PANEL WITH GFR
Anion gap: 8 (ref 5–15)
BUN: 6 mg/dL — ABNORMAL LOW (ref 8–23)
CO2: 23 mmol/L (ref 22–32)
Calcium: 8.8 mg/dL — ABNORMAL LOW (ref 8.9–10.3)
Chloride: 103 mmol/L (ref 98–111)
Creatinine, Ser: 0.82 mg/dL (ref 0.61–1.24)
GFR, Estimated: >60 mL/min (ref >60)
Glucose, Bld: 99 mg/dL (ref 70–99)
Potassium: 3.9 mmol/L (ref 3.5–5.1)
Sodium: 134 mmol/L — ABNORMAL LOW (ref 135–145)

## 2024-04-14 LAB — GLUCOSE, CAPILLARY: Glucose-Capillary: 111 mg/dL — ABNORMAL HIGH (ref 70–99)

## 2024-04-14 LAB — MAGNESIUM: Magnesium: 1.9 mg/dL (ref 1.7–2.4)

## 2024-04-14 MED ORDER — METOPROLOL SUCCINATE ER 50 MG PO TB24: 50.0000 mg | ORAL_TABLET | Freq: Every day | ORAL | Status: DC

## 2024-04-14 MED ORDER — NITROFURANTOIN MONOHYD MACRO 100 MG PO CAPS
100.0000 mg | ORAL_CAPSULE | Freq: Two times a day (BID) | ORAL | 0 refills | Status: AC
  Filled 2024-04-14: qty 12, 6d supply, fill #0

## 2024-04-14 NOTE — Progress Notes (Signed)
 Discharge instructions and med details reviewed with patient and daughter. All questions answered. Printed AVS given to patient. Med delivered by pharmacy in pt's hands. IV removed Patient escorted out via wheelchair.

## 2024-04-14 NOTE — Discharge Summary (Signed)
 Physician Discharge Summary   Eugene Henderson  male DOB: 1951-05-18  FMW:[id number]  PCP: Louder Duwaine SQUIBB, DO  Admit date: 04/12/2024 Discharge date: 04/14/2024  Admitted From: home Disposition:  home Home Health: Yes CODE STATUS: Full code   Hospital Course:  For full details, please see H&P, progress notes, consult notes and ancillary notes.  Briefly,  Eugene Henderson is a 73 y.o. male with medical history significant for HTN, A-fib on Eliquis , HFrEF (EF 20 to 25% 06/2022), OSA/OHS, thoracic aneurysm and AAA, presenting with a near syncopal event and orthostatic hypotension in the setting of a 1 week history of diarrhea with decreased oral intake.    In the ED he was orthostatic with systolic dropping from 107 lying to 84 standing.  Other vitals were unremarkable  Postural dizziness with presyncope Orthostatic hypotension Suspect secondary to hypotension related to volume loss from decreased oral intake, recent diarrhea and ongoing diuretic meds --pt received IV hydration with resolution of orthostasis.  PT cleared pt for discharge. --Hold home lasix , Entresto  and aldactone  pending outpatient cardio f/u.   Urinary tract infection History of recurrent UTIs --pt reported slight burning with urination.  Pt received 1 dose of ceftriaxone  f/b 6 days of Macrobid.   COPD (chronic obstructive pulmonary disease) (HCC) OSA/OHS Continue home inhalers    AAA (abdominal aortic aneurysm) Thoracic aortic aneurysm Patient denies chest pain No acute issues suspected   Chronic systolic heart failure (HCC) Clinically dry --Hold home lasix , Entresto  and aldactone  pending outpatient cardio f/u. --home Toprol  reduced from 150 mg to 50 mg daily.   Persistent atrial fibrillation (HCC) Continue apixaban  and digoxin  --home Toprol  reduced from 150 mg to 50 mg daily.    Discharge Diagnoses:  Principal Problem:   Postural dizziness with presyncope Active Problems:   Orthostatic  hypotension   Urinary tract infection   Persistent atrial fibrillation (HCC)   Hyperthyroidism   Essential hypertension   Chronic systolic heart failure (HCC)   AAA (abdominal aortic aneurysm)   COPD (chronic obstructive pulmonary disease) (HCC)     Discharge Instructions:  Allergies as of 04/14/2024   No Known Allergies      Medication List     PAUSE taking these medications    furosemide  40 MG tablet Wait to take this until your doctor or other care provider tells you to start again. Hold until followup with cardiology due to low blood pressure and orthostatic drop. Commonly known as: LASIX  Take 1 tablet (40 mg total) by mouth daily.   sacubitril -valsartan  49-51 MG Wait to take this until your doctor or other care provider tells you to start again. Hold until followup with cardiology due to low blood pressure and orthostatic drop. Commonly known as: ENTRESTO  Take 1 tablet by mouth 2 (two) times daily.   spironolactone  25 MG tablet Wait to take this until your doctor or other care provider tells you to start again. Hold until followup with cardiology due to low blood pressure and orthostatic drop. Commonly known as: ALDACTONE  Take 1 tablet (25 mg total) by mouth daily.       STOP taking these medications    ibuprofen  200 MG tablet Commonly known as: ADVIL        TAKE these medications    acetaminophen  500 MG tablet Commonly known as: TYLENOL  Take 1-2 tablets (500-1,000 mg total) by mouth every 6 (six) hours as needed for mild pain (pain score 1-3), fever or headache. What changed:  how much to take when to take this  albuterol  108 (90 Base) MCG/ACT inhaler Commonly known as: VENTOLIN  HFA Inhale 1-2 puffs into the lungs every 6 (six) hours as needed for wheezing or shortness of breath.   atorvastatin  40 MG tablet Commonly known as: LIPITOR Take 1 tablet (40 mg total) by mouth daily.   Blood Glucose Monitoring Suppl Devi 1 each by Does not apply  route as directed. Dispense based on patient and insurance preference. Use up to four times daily as directed. (FOR ICD-10 E10.9, E11.9).   BLOOD GLUCOSE TEST STRIPS Strp 1 each by Does not apply route as directed. Dispense based on patient and insurance preference. Use up to four times daily as directed. (FOR ICD-10 E10.9, E11.9).   Blood Pressure Cuff Misc Check blood pressure as instructed by your physician   digoxin  0.125 MG tablet Commonly known as: LANOXIN  TAKE 1/2 TABLET BY MOUTH EVERY DAY   Eliquis  5 MG Tabs tablet Generic drug: apixaban  TAKE 1 TABLET BY MOUTH TWICE A DAY   folic acid  1 MG tablet Commonly known as: FOLVITE  Take 1 tablet (1 mg total) by mouth daily.   Jardiance  10 MG Tabs tablet Generic drug: empagliflozin  TAKE 1 TABLET EVERY DAY   Lancet Device Misc 1 each by Does not apply route as directed. Dispense based on patient and insurance preference. Use up to four times daily as directed. (FOR ICD-10 E10.9, E11.9).   Lancets Misc 1 each by Does not apply route as directed. Dispense based on patient and insurance preference. Use up to four times daily as directed. (FOR ICD-10 E10.9, E11.9).   metFORMIN  500 MG tablet Commonly known as: GLUCOPHAGE  Take 1 tablet (500 mg total) by mouth 2 (two) times daily with a meal.   metoprolol  succinate 50 MG 24 hr tablet Commonly known as: TOPROL -XL Take 1 tablet (50 mg total) by mouth daily. Reduced from 150 mg. What changed:  how much to take additional instructions   nitrofurantoin (macrocrystal-monohydrate) 100 MG capsule Commonly known as: MACROBID Take 1 capsule (100 mg total) by mouth every 12 (twelve) hours for 6 days.   nitroGLYCERIN  0.4 MG SL tablet Commonly known as: NITROSTAT  Place 1 tablet (0.4 mg total) under the tongue every 5 (five) minutes as needed for chest pain. This may be repeated twice and please call 911 when taking the third dose.   pantoprazole  40 MG tablet Commonly known as:  PROTONIX  Take 1 tablet (40 mg total) by mouth 2 (two) times daily.   potassium chloride  SA 20 MEQ tablet Commonly known as: KLOR-CON  M TAKE 1 TABLET BY MOUTH EVERY DAY   Rybelsus  7 MG Tabs Generic drug: Semaglutide  Take 1 tablet (7 mg total) by mouth daily. What changed: Another medication with the same name was removed. Continue taking this medication, and follow the directions you see here.   Stiolto Respimat  2.5-2.5 MCG/ACT Aers Generic drug: Tiotropium Bromide-Olodaterol INHALE 2 PUFFS BY MOUTH INTO THE LUNGS DAILY   traMADol  50 MG tablet Commonly known as: ULTRAM  Take 50 mg by mouth 3 (three) times daily as needed.         Follow-up Information     Furth, Cadence H, PA-C Follow up in 1 week(s).   Specialty: Cardiology Contact information: [address] [place] [address] [phone]                 No Known Allergies   The results of significant diagnostics from this hospitalization (including imaging, microbiology, ancillary and laboratory) are listed below for reference.  Consultations:   Procedures/Studies: No results found.    Labs: BNP (last 3 results) Recent Labs    04/22/23 1441 03/06/24 0951  BNP 228.5* 37.6   Basic Metabolic Panel: Recent Labs  Lab 04/12/24 1609 04/14/24 0509  NA 130* 134*  K 3.9 3.9  CL 95* 103  CO2 22 23  GLUCOSE 106* 99  BUN 9 6*  CREATININE 1.21 0.82  CALCIUM  9.1 8.8*  MG  --  1.9   Liver Function Tests: Recent Labs  Lab 04/12/24 1609  AST 24  ALT 20  ALKPHOS 86  BILITOT 1.3*  PROT 7.8  ALBUMIN 4.1   No results for input(s): LIPASE, AMYLASE in the last 168 hours. No results for input(s): AMMONIA in the last 168 hours. CBC: Recent Labs  Lab 04/12/24 1609  WBC 3.8*  HGB 14.1  HCT 41.3  MCV 85.0  PLT 174   Cardiac Enzymes: No results for input(s): CKTOTAL, CKMB, CKMBINDEX, TROPONINI in the last 168 hours. BNP: Invalid input(s):  POCBNP CBG: Recent Labs  Lab 04/14/24 0521  GLUCAP 111*   D-Dimer No results for input(s): DDIMER in the last 72 hours. Hgb A1c No results for input(s): HGBA1C in the last 72 hours. Lipid Profile No results for input(s): CHOL, HDL, LDLCALC, TRIG, CHOLHDL, LDLDIRECT in the last 72 hours. Thyroid  function studies No results for input(s): TSH, T4TOTAL, T3FREE, THYROIDAB in the last 72 hours.  Invalid input(s): FREET3 Anemia work up No results for input(s): VITAMINB12, FOLATE, FERRITIN, TIBC, IRON, RETICCTPCT in the last 72 hours. Urinalysis    Component Value Date/Time   COLORURINE YELLOW (A) 04/13/2024 0025   APPEARANCEUR CLOUDY (A) 04/13/2024 0025   APPEARANCEUR Cloudy (A) 11/09/2023 0928   LABSPEC 1.010 04/13/2024 0025   PHURINE 5.0 04/13/2024 0025   GLUCOSEU >=500 (A) 04/13/2024 0025   HGBUR LARGE (A) 04/13/2024 0025   BILIRUBINUR NEGATIVE 04/13/2024 0025   BILIRUBINUR negative 01/04/2024 1545   BILIRUBINUR Negative 11/09/2023 0928   KETONESUR NEGATIVE 04/13/2024 0025   PROTEINUR NEGATIVE 04/13/2024 0025   UROBILINOGEN 0.2 01/04/2024 1545   NITRITE NEGATIVE 04/13/2024 0025   LEUKOCYTESUR MODERATE (A) 04/13/2024 0025   Sepsis Labs Recent Labs  Lab 04/12/24 1609  WBC 3.8*   Microbiology No results found for this or any previous visit (from the past 240 hours).   Total time spend on discharging this patient, including the last patient exam, discussing the hospital stay, instructions for ongoing care as it relates to all pertinent caregivers, as well as preparing the medical discharge records, prescriptions, and/or referrals as applicable, is 45 minutes.    Ellouise Haber, MD  [facility] 04/14/2024, 11:16 AM

## 2024-04-14 NOTE — Evaluation (Signed)
 Physical Therapy Evaluation Patient Details Name: Eugene Henderson MRN: [id number] DOB: 12/27/1950 Today's Date: 04/14/2024  History of Present Illness  Eugene Henderson is a 73 y.o. male with medical history significant for HTN, A-fib on Eliquis , HFrEF (EF 20 to 25% 06/2022), OSA/OHS, thoracic aneurysm and AAA, being admitted with urinary tract infection presenting with a near syncopal event and orthostatic hypotension in the setting of a 1 week history of diarrhea with decreased oral intake. Patient had positive UA likely from dehydration. He was admitted for antibiotics and hydration.  Clinical Impression  73 yo Male reports general decline in mobility over last several years with increased chronic low back/left hip pain. He reports history of osteoarthritis and states that his back/hip pain significantly affect his mobility. He lives with his daughter. He reports he spends most of his day in a recliner and will use the urinal. He will only get up to go to the bathroom for BM. Eugene Henderson denies any recent falls. He uses a tripod cane at baseline. He demonstrates significant weakness in BLE (grossly 3/5). He is modified independent in bed mobility requiring extra time/effort with elevated head of bed. He can transfer sit to stand with close stand by assist with extra effort/time. He was able to stand at sink to brush teeth with fair standing balance using one hand support on counter. Patient ambulated 30 feet with tripod base cane, requiring CGA for safety with intermittent hand hold assist on furniture as able. While he ambulates with step through gait pattern, he does demonstrate antalgic gait with short stance time on left leg. He also demonstrates shaking in legs likely from muscle weakness. Patient denies any dizziness throughout session. He would benefit from skilled PT intervention for LE strengthening and mobility.         If plan is discharge home, recommend the following: A little help with  walking and/or transfers;A little help with bathing/dressing/bathroom;Assistance with cooking/housework;Assist for transportation;Help with stairs or ramp for entrance;Direct supervision/assist for medications management   Can travel by private vehicle        Equipment Recommendations None recommended by PT  Recommendations for Other Services       Functional Status Assessment Patient has had a recent decline in their functional status and demonstrates the ability to make significant improvements in function in a reasonable and predictable amount of time.     Precautions / Restrictions Precautions Precautions: Fall Restrictions Weight Bearing Restrictions Per Provider Order: No      Mobility  Bed Mobility Overal bed mobility: Modified Independent             General bed mobility comments: able to transition supine<>sit modified independent with intermittent use of bedrails, elevated head of bed;    Transfers Overall transfer level: Needs assistance Equipment used: None Transfers: Sit to/from Stand Sit to Stand: Contact guard assist           General transfer comment: pt transfers sit<>stand from bed pushing on bed and upon standing with grab cane to steady self, requires close stand by assist to CGA for safety; Requires increased effort/time.    Ambulation/Gait Ambulation/Gait assistance: Contact guard assist Gait Distance (Feet): 20 Feet Assistive device: Straight cane Gait Pattern/deviations: Step-through pattern, Decreased step length - right, Decreased step length - left, Trunk flexed, Decreased stance time - left, Antalgic Gait velocity: decreased     General Gait Details: pt ambulates with slight unsteadiness, using tripod base cane and holding onto counter/furniture for stability; Demonstrates increased shaking  in knees likely from weakness; antalgic gait with decreased stance on LLE  Stairs            Wheelchair Mobility     Tilt Bed     Modified Rankin (Stroke Patients Only)       Balance Overall balance assessment: Needs assistance Sitting-balance support: Feet supported Sitting balance-Leahy Scale: Good Sitting balance - Comments: able to sit edge of bed while eating breakfast   Standing balance support: Single extremity supported Standing balance-Leahy Scale: Fair Standing balance comment: able to stand at sink and brush teeth/wash face, requires CGA for safety                             Pertinent Vitals/Pain Pain Assessment Pain Assessment: 0-10 Pain Score: 8  Pain Location: low back/left hip; chronic pain from OA per patient Pain Descriptors / Indicators: Aching, Throbbing Pain Intervention(s): Limited activity within patient's tolerance, Monitored during session, Repositioned    Home Living Family/patient expects to be discharged to:: Private residence Living Arrangements: Children (Daughter, Sari) Available Help at Discharge: Family;Available PRN/intermittently Type of Home: House Home Access: Stairs to enter Entrance Stairs-Rails: Right;Left;Can reach both Entrance Stairs-Number of Steps: 4   Home Layout: One level Home Equipment: Cane - single point Additional Comments: uses urinal;    Prior Function Prior Level of Function : Needs assist       Physical Assist : Mobility (physical);ADLs (physical)   ADLs (physical): Bathing;IADLs Mobility Comments: Pt reports sedentary lifestyle, would sit in recliner daily and use urinal; would only get up to go to the bathroom for BM. ADLs Comments: Daughter would help with shower transfer; he reports being mostly modified independent for dressing; daughter did all cooking/cleaning; not driving;     Extremity/Trunk Assessment   Upper Extremity Assessment Upper Extremity Assessment: Generalized weakness    Lower Extremity Assessment Lower Extremity Assessment: RLE deficits/detail;LLE deficits/detail RLE Deficits / Details: AROM is  WFL; strength grossly 3/5, intact light touch sensation LLE Deficits / Details: AROM is Eugene Henderson however increased pain with left hip movement; strength grossly 3/5, intact light touch sensation    Cervical / Trunk Assessment Cervical / Trunk Assessment: Kyphotic  Communication   Communication Communication: No apparent difficulties    Cognition Arousal: Alert Behavior During Therapy: WFL for tasks assessed/performed   PT - Cognitive impairments: No apparent impairments                       PT - Cognition Comments: oriented x4 Following commands: Intact       Cueing Cueing Techniques: Verbal cues     General Comments      Exercises Other Exercises Other Exercises: Educated patient on role of PT and recommendations;   Assessment/Plan    PT Assessment Patient needs continued PT services  PT Problem List Decreased strength;Pain;Decreased activity tolerance;Decreased balance;Decreased mobility       PT Treatment Interventions Balance training;Gait training;Neuromuscular re-education;Stair training;Functional mobility training;Patient/family education;Therapeutic activities;Therapeutic exercise    PT Goals (Current goals can be found in the Care Plan section)  Acute Rehab PT Goals Patient Stated Goal: to go home PT Goal Formulation: With patient Time For Goal Achievement: 04/28/24 Potential to Achieve Goals: Good    Frequency Min 1X/week     Co-evaluation               AM-PAC PT 6 Clicks Mobility  Outcome Measure Help needed turning from your back  to your side while in a flat bed without using bedrails?: None Help needed moving from lying on your back to sitting on the side of a flat bed without using bedrails?: None Help needed moving to and from a bed to a chair (including a wheelchair)?: A Little Help needed standing up from a chair using your arms (e.g., wheelchair or bedside chair)?: A Little Help needed to walk in Henderson room?: A Little Help  needed climbing 3-5 steps with a railing? : A Lot 6 Click Score: 19    End of Session Equipment Utilized During Treatment: Gait belt Activity Tolerance: Patient limited by pain Patient left: in chair;with call bell/phone within reach;with chair alarm set Nurse Communication: Mobility status PT Visit Diagnosis: Unsteadiness on feet (R26.81);Muscle weakness (generalized) (M62.81)    Time: 9061-8997 PT Time Calculation (min) (ACUTE ONLY): 24 min   Charges:   PT Evaluation $PT Eval Low Complexity: 1 Low   PT General Charges $$ ACUTE PT VISIT: 1 Visit           Lynnelle Mesmer PT, DPT 04/14/2024, 1:15 PM

## 2024-04-16 ENCOUNTER — Other Ambulatory Visit: Payer: Self-pay | Admitting: *Deleted

## 2024-04-17 NOTE — Patient Instructions (Signed)
 Visit Information  Thank you for taking time to visit with me today. Please don't hesitate to contact me if I can be of assistance to you before our next scheduled appointment.  Our next appointment is by telephone on 05/07/24 at 9am Please call the care guide team at [phone] if you need to cancel or reschedule your appointment.   Following is a copy of your care plan:   Goals Addressed             This Visit's Progress    VBCI Social Work Care Plan       Problems:   Lacks knowledge of how to connect to obtain DME and  personal care services  CSW Clinical Goal(s):   Over the next 30 days the Caregiver will will follow up with [facility] to schedule initial appointment              Over the next 90 days, CSW will follow up with patient's doctor to request completion of request form for an assessment for personal care services as well as an order for a walker if applicable  Interventions:  Level of Care Concerns in a patient with DMII and Heart Failure Current level of care: Home with other family or significant other(s): family member: daughter  Evaluation of patient's unmet needs in current living environment ADL's Assessed needs, level of care concerns, how currently meeting needs and barriers to care Discuss community support options (discussed personal care services, application process and plan to request forms to be completed by provider) Discussed family support and building support system : confirmed that patient's daughter and step daughter assist with patient's care needs as the are able Discussed options for obtaining low cost assistive devices Confirmed plan for patient's daughter to call to schedule appointment with Podiatrist   Patient Goals/Self-Care Activities:  patient's daughter to call to schedule initial appointment with Podiatrist  Plan:   Telephone follow up appointment with care management team member scheduled for:  05/07/24 9am         Please call the Suicide and Crisis Lifeline: 988 call the [place]  National Suicide Prevention Lifeline: (682)532-9533 or TTY: 480-070-4727 TTY 934-463-5163) to talk to a trained counselor call 1-800-273-TALK (toll free, 24 hour hotline) call 911 if you are experiencing a Mental Health or Behavioral Health Crisis or need someone to talk to.  Caregiver verbalized understanding of Care plan and visit instructions communicated this visit  Wilmina Maxham, LCSW [place]  [facility] Licensed Clinical Social Worker  Direct Dial: [phone]

## 2024-04-17 NOTE — Patient Outreach (Signed)
 Complex Care Management   Visit Note  04/17/2024  Name:  Eugene Henderson MRN: [id number] DOB: 1950/08/26  Situation: Referral received for Complex Care Management related to in home care support and DME I obtained verbal consent from Patient.  Visit completed with Caregiver  on the phone 04/16/24  Background:   Past Medical History:  Diagnosis Date   Arthritis    Ascending aortic aneurysm    a. 09/2017 Stable TAA - 5.1cm.   Asthma    Chronic systolic CHF (congestive heart failure) (HCC)    a. EF 25-30% by echo in 07/2016 with cath showing no significant CAD b. 01/2017: EF 30-35% with diffuse HK and moderate MR; c. 07/2017 Echo: EF 30-35%, diff hK. Mild MR, mildly dil LA. PASP .   Diverticulitis    Diverticulitis of large intestine with perforation without abscess or bleeding 05/13/2017   Hypertension    Hyperthyroidism    NICM (nonischemic cardiomyopathy) (HCC)    Noncompliance    Persistent atrial fibrillation (HCC)    a. CHA2DS2VASc = 3-->Eliquis  (? compliance).    Assessment: Patient Reported Symptoms:  Cognitive Cognitive Status: Unable to Assess Cognitive/Intellectual Conditions Management [RPT]: None reported or documented in medical history or problem list   Health Maintenance Behaviors: Annual physical exam Healing Pattern: Slow Health Facilitated by: Pain control, Rest  Neurological Neurological Review of Symptoms: Hearing changes (needs hearing aid) Neurological Management Strategies: Routine screening  HEENT HEENT Symptoms Reported: Change or loss of hearing, Eye pain, Mouth or teeth pain (right eye pain, also needs hearing aids, needs dentures-plans is to pull teeth and give him dentures) HEENT Management Strategies: Routine screening    Cardiovascular Cardiovascular Symptoms Reported: Chest pain or discomfort (has a defibulator) Does patient have uncontrolled Hypertension?: No (needs blood pressure cuff) Weight: 275 lb (124.7 kg) Cardiovascular Comment:  echo cardio gram on 04/26/24  Respiratory Respiratory Symptoms Reported: Shortness of breath, Productive cough Other Respiratory Symptoms: with exceretion, uses rescue inhaler often Respiratory Management Strategies: Mechanical ventilation  Endocrine Is patient diabetic?: Yes Is patient checking blood sugars at home?: No Endocrine Comment: has prescription from [facility] well pharmacy supposed to fill prescription for blood sugar monitor  Gastrointestinal Gastrointestinal Symptoms Reported: Incontinence Additional Gastrointestinal Details: frequent incontinence, cannot get up fast enough due to body pain, has jugs in his room that he uses      Genitourinary Genitourinary Symptoms Reported: Incontinence    Integumentary Integumentary Symptoms Reported: No symptoms reported    Musculoskeletal Musculoskelatal Symptoms Reviewed: Limited mobility, Difficulty walking, Muscle pain, Joint pain Additional Musculoskeletal Details: muscle pain, knee pain, arthritis in knee, shoulder and hip pain-followed by pain clinic next appointment [facility] Musculoskeletal Management Strategies: Medication therapy, Coping strategies Falls in the past year?: Yes Number of falls in past year: 2 or more Was there an injury with Fall?: No Fall Risk Category Calculator: 2 Patient Fall Risk Level: Moderate Fall Risk    Psychosocial Psychosocial Symptoms Reported: Depression - if selected complete PHQ 2-9 Additional Psychological Details: not able to go anywhere outside of the house for years, in pain, very limited socially, willing to go to the senior center but has not transportation-will go back once pain is under control     Quality of Family Relationships: supportive, involved Do you feel physically threatened by others?: No    04/17/2024    PHQ2-9 Depression Screening   Little interest or pleasure in doing things Not at all  Feeling down, depressed, or hopeless Nearly every day  PHQ-2  -  Total Score 3  Trouble falling or staying asleep, or sleeping too much Several days  Feeling tired or having little energy Not at all  Poor appetite or overeating  Not at all  Feeling bad about yourself - or that you are a failure or have let yourself or your family down Several days  Trouble concentrating on things, such as reading the newspaper or watching television Several days  Moving or speaking so slowly that other people could have noticed.  Or the opposite - being so fidgety or restless that you have been moving around a lot more than usual Not at all  Thoughts that you would be better off dead, or hurting yourself in some way Not at all  PHQ2-9 Total Score 6  If you checked off any problems, how difficult have these problems made it for you to do your work, take care of things at home, or get along with other people    Depression Interventions/Treatment      There were no vitals filed for this visit.    Medications Reviewed Today     Reviewed by Ermalinda Lenn HERO, LCSW (Social Worker) on 04/16/24 at 1221  Med List Status: <None>   Medication Order Taking? Sig Documenting Provider Last Dose Status Informant  acetaminophen  (TYLENOL ) 500 MG tablet [id number]  Take 1-2 tablets (500-1,000 mg total) by mouth every 6 (six) hours as needed for mild pain (pain score 1-3), fever or headache.  Patient taking differently: Take 1,000 mg by mouth 2 (two) times daily.   Shorey, Megan P, DO  Active Child  albuterol  (VENTOLIN  HFA) 108 (90 Base) MCG/ACT inhaler [id number]  Inhale 1-2 puffs into the lungs every 6 (six) hours as needed for wheezing or shortness of breath. [place], Megan P, DO  Active Child  atorvastatin  (LIPITOR) 40 MG tablet [id number]  Take 1 tablet (40 mg total) by mouth daily. Vicci Bouchard P, DO  Active Child  Blood Glucose Monitoring Suppl DEVI [id number]  1 each by Does not apply route as directed. Dispense based on patient and insurance preference. Use up to four times daily  as directed. (FOR ICD-10 E10.9, E11.9). Melvin Pao, NP  Active Child  Blood Pressure Monitoring (BLOOD PRESSURE CUFF) MISC [id number]  Check blood pressure as instructed by your physician Furth, Cadence H, PA-C  Active Child  digoxin  (LANOXIN ) 0.125 MG tablet [id number]  TAKE 1/2 TABLET BY MOUTH EVERY DAY Cindie Ole DASEN, MD  Active Child  ELIQUIS  5 MG TABS tablet [id number]  TAKE 1 TABLET BY MOUTH TWICE A DAY Darron Deatrice LABOR, MD  Active Child  empagliflozin  (JARDIANCE ) 10 MG TABS tablet [id number]  TAKE 1 TABLET EVERY DAY Paschal, Megan P, DO  Active Child  folic acid  (FOLVITE ) 1 MG tablet [id number]  Take 1 tablet (1 mg total) by mouth daily. Rubano, Megan P, DO  Active Child  furosemide  (LASIX ) 40 MG tablet [id number]  Take 1 tablet (40 mg total) by mouth daily. Cindie Ole DASEN, MD  Active Child  Glucose Blood (BLOOD GLUCOSE TEST STRIPS) STRP [id number]  1 each by Does not apply route as directed. Dispense based on patient and insurance preference. Use up to four times daily as directed. (FOR ICD-10 E10.9, E11.9). Melvin Pao, NP  Active Child  Lancet Device MISC [id number]  1 each by Does not apply route as directed. Dispense based on patient and insurance preference. Use up to four times daily as directed. (FOR ICD-10 E10.9, E11.9). Melvin Pao, NP  Active Child  Lancets MISC [id number]  1 each by Does not apply route as directed. Dispense based on patient and insurance preference. Use up to four times daily as directed. (FOR ICD-10 E10.9, E11.9). Melvin Pao, NP  Active Child  metFORMIN  (GLUCOPHAGE ) 500 MG tablet [id number]  Take 1 tablet (500 mg total) by mouth 2 (two) times daily with a meal. Vicci, Megan P, DO  Active Child  metoprolol  succinate (TOPROL -XL) 50 MG 24 hr tablet [id number] Yes Take 1 tablet (50 mg total) by mouth daily. Reduced from 150 mg. Awanda [place], MD  Active   nitrofurantoin, macrocrystal-monohydrate, (MACROBID) 100 MG capsule [id number]  Take 1 capsule  (100 mg total) by mouth every 12 (twelve) hours for 6 days. Awanda [place], MD  Active   nitroGLYCERIN  (NITROSTAT ) 0.4 MG SL tablet [id number]  Place 1 tablet (0.4 mg total) under the tongue every 5 (five) minutes as needed for chest pain. This may be repeated twice and please call 911 when taking the third dose. Furth, Cadence H, PA-C  Expired 03/20/24 2359 Child  pantoprazole  (PROTONIX ) 40 MG tablet [id number]  Take 1 tablet (40 mg total) by mouth 2 (two) times daily. Sesay, Megan P, DO  Active Child  potassium chloride  SA (KLOR-CON  M) 20 MEQ tablet [id number]  TAKE 1 TABLET BY MOUTH EVERY DAY Vivar, Megan P, DO  Active Child  sacubitril -valsartan  (ENTRESTO ) 49-51 MG [id number]  Take 1 tablet by mouth 2 (two) times daily. Furth, Cadence H, PA-C  Active Child  Semaglutide  (RYBELSUS ) 7 MG TABS [id number]  Take 1 tablet (7 mg total) by mouth daily. Hildenbrand, Megan P, DO  Active Child  spironolactone  (ALDACTONE ) 25 MG tablet [id number]  Take 1 tablet (25 mg total) by mouth daily. Wolters, Megan P, DO  Active Child  Tiotropium Bromide-Olodaterol (STIOLTO RESPIMAT ) 2.5-2.5 MCG/ACT AERS [id number]  INHALE 2 PUFFS BY MOUTH INTO THE LUNGS DAILY Isadora Hose, MD  Active Child  traMADol  (ULTRAM ) 50 MG tablet [id number]  Take 50 mg by mouth 3 (three) times daily as needed. [provider]  Active Child           Med Note ROBERTA CATHLENE RAMAN   Fri Apr 13, 2024  2:26 AM) Has rx not started            Recommendation:   PCP Follow-up Specialty provider follow-up as scheduled In home personal care services  Follow Up Plan:   Telephone follow up appointment date/time:  05/07/24 9am  Sherrick Araki, LCSW [place]  [facility] Licensed Clinical Social Worker  Direct Dial: [phone]

## 2024-04-18 ENCOUNTER — Telehealth: Payer: Self-pay | Admitting: Family Medicine

## 2024-04-18 NOTE — Telephone Encounter (Signed)
 On today's date @TODAY @ paperwork has been received on patient's behalf Fax. The paperwork that has been received is FMLA.   Paperwork to be returned Fax.   Paperwork to be placed in providers incoming folder to be worked by their CMA. Paperwork is expected to be completed and returned 5-7 business days.

## 2024-04-20 ENCOUNTER — Telehealth: Payer: Self-pay

## 2024-04-24 DIAGNOSIS — M51379 Other intervertebral disc degeneration, lumbosacral region without mention of lumbar back pain or lower extremity pain: Secondary | ICD-10-CM | POA: Diagnosis not present

## 2024-04-24 DIAGNOSIS — E559 Vitamin D deficiency, unspecified: Secondary | ICD-10-CM | POA: Diagnosis not present

## 2024-04-24 DIAGNOSIS — Z79899 Other long term (current) drug therapy: Secondary | ICD-10-CM | POA: Diagnosis not present

## 2024-04-26 ENCOUNTER — Ambulatory Visit: Attending: Medical

## 2024-04-26 DIAGNOSIS — R0602 Shortness of breath: Secondary | ICD-10-CM | POA: Diagnosis not present

## 2024-04-26 LAB — ECHOCARDIOGRAM COMPLETE: S' Lateral: 4.2 cm

## 2024-04-27 ENCOUNTER — Telehealth: Payer: Self-pay

## 2024-04-27 NOTE — Patient Instructions (Signed)
 Eugene Henderson - I am sorry I was unable to reach you today for our scheduled appointment. I work with Henderson Duwaine SQUIBB, DO and am calling to support your healthcare needs. Please contact me at [phone] at your earliest convenience. I look forward to speaking with you soon.   Thank you,  Santana Stamp BSN, CCM [place]  [facility] Population Health RN Care Manager Direct Dial: [phone]  Fax: [phone]

## 2024-04-30 ENCOUNTER — Other Ambulatory Visit: Payer: Self-pay | Admitting: Cardiology

## 2024-04-30 ENCOUNTER — Other Ambulatory Visit: Payer: Self-pay | Admitting: Family Medicine

## 2024-04-30 DIAGNOSIS — I4819 Other persistent atrial fibrillation: Secondary | ICD-10-CM

## 2024-04-30 NOTE — Telephone Encounter (Signed)
 Eliquis  5mg  refill request received. Patient is 73 years old, weight-124.7kg, Crea-0.82 on 04/14/24, Diagnosis-Afib, and last seen by Cadence Furth on 03/06/24. Dose is appropriate based on dosing criteria. Will send in refill to requested pharmacy.

## 2024-05-01 ENCOUNTER — Emergency Department

## 2024-05-01 ENCOUNTER — Other Ambulatory Visit: Payer: Self-pay

## 2024-05-01 ENCOUNTER — Inpatient Hospital Stay
Admission: EM | Admit: 2024-05-01 | Discharge: 2024-05-10 | DRG: 291 | Disposition: A | Discharge status: Skilled Nursing Facility | Attending: Internal Medicine | Admitting: Internal Medicine

## 2024-05-01 ENCOUNTER — Ambulatory Visit: Admitting: Medical

## 2024-05-01 DIAGNOSIS — R0602 Shortness of breath: Secondary | ICD-10-CM | POA: Diagnosis not present

## 2024-05-01 DIAGNOSIS — M25832 Other specified joint disorders, left wrist: Secondary | ICD-10-CM | POA: Diagnosis not present

## 2024-05-01 DIAGNOSIS — S43002A Unspecified subluxation of left shoulder joint, initial encounter: Secondary | ICD-10-CM | POA: Diagnosis not present

## 2024-05-01 DIAGNOSIS — J432 Centrilobular emphysema: Secondary | ICD-10-CM | POA: Diagnosis not present

## 2024-05-01 DIAGNOSIS — S52509A Unspecified fracture of the lower end of unspecified radius, initial encounter for closed fracture: Secondary | ICD-10-CM | POA: Diagnosis not present

## 2024-05-01 DIAGNOSIS — I4891 Unspecified atrial fibrillation: Secondary | ICD-10-CM | POA: Diagnosis not present

## 2024-05-01 DIAGNOSIS — M1812 Unilateral primary osteoarthritis of first carpometacarpal joint, left hand: Secondary | ICD-10-CM | POA: Diagnosis not present

## 2024-05-01 DIAGNOSIS — M25422 Effusion, left elbow: Secondary | ICD-10-CM | POA: Diagnosis not present

## 2024-05-01 DIAGNOSIS — E785 Hyperlipidemia, unspecified: Secondary | ICD-10-CM | POA: Diagnosis present

## 2024-05-01 DIAGNOSIS — J449 Chronic obstructive pulmonary disease, unspecified: Secondary | ICD-10-CM | POA: Diagnosis present

## 2024-05-01 DIAGNOSIS — I509 Heart failure, unspecified: Secondary | ICD-10-CM | POA: Diagnosis not present

## 2024-05-01 DIAGNOSIS — I7121 Aneurysm of the ascending aorta, without rupture: Secondary | ICD-10-CM | POA: Diagnosis not present

## 2024-05-01 DIAGNOSIS — Z720 Tobacco use: Secondary | ICD-10-CM | POA: Diagnosis present

## 2024-05-01 DIAGNOSIS — I10 Essential (primary) hypertension: Secondary | ICD-10-CM | POA: Diagnosis present

## 2024-05-01 DIAGNOSIS — Z7401 Bed confinement status: Secondary | ICD-10-CM | POA: Diagnosis not present

## 2024-05-01 DIAGNOSIS — I110 Hypertensive heart disease with heart failure: Secondary | ICD-10-CM | POA: Diagnosis not present

## 2024-05-01 DIAGNOSIS — J441 Chronic obstructive pulmonary disease with (acute) exacerbation: Principal | ICD-10-CM

## 2024-05-01 DIAGNOSIS — M19012 Primary osteoarthritis, left shoulder: Secondary | ICD-10-CM | POA: Diagnosis not present

## 2024-05-01 DIAGNOSIS — I4819 Other persistent atrial fibrillation: Secondary | ICD-10-CM | POA: Diagnosis present

## 2024-05-01 DIAGNOSIS — R059 Cough, unspecified: Secondary | ICD-10-CM

## 2024-05-01 DIAGNOSIS — M25532 Pain in left wrist: Secondary | ICD-10-CM | POA: Diagnosis not present

## 2024-05-01 DIAGNOSIS — R000 Tachycardia, unspecified: Secondary | ICD-10-CM | POA: Diagnosis not present

## 2024-05-01 DIAGNOSIS — M25522 Pain in left elbow: Secondary | ICD-10-CM | POA: Diagnosis not present

## 2024-05-01 DIAGNOSIS — I4821 Permanent atrial fibrillation: Secondary | ICD-10-CM | POA: Diagnosis not present

## 2024-05-01 DIAGNOSIS — I517 Cardiomegaly: Secondary | ICD-10-CM | POA: Diagnosis not present

## 2024-05-01 LAB — CBC
HCT: 34.9 % — ABNORMAL LOW (ref 39.0–52.0)
Hemoglobin: 11.4 g/dL — ABNORMAL LOW (ref 13.0–17.0)
MCH: 28.6 pg (ref 26.0–34.0)
MCHC: 32.7 g/dL (ref 30.0–36.0)
MCV: 87.5 fL (ref 80.0–100.0)
Platelets: 179 K/uL (ref 150–400)
RBC: 3.99 MIL/uL — ABNORMAL LOW (ref 4.22–5.81)
RDW: 15.6 % — ABNORMAL HIGH (ref 11.5–15.5)
WBC: 4.1 K/uL (ref 4.0–10.5)
nRBC: 0 % (ref 0.0–0.2)

## 2024-05-01 LAB — RESP PANEL BY RT-PCR (RSV, FLU A&B, COVID)  RVPGX2
Influenza A by PCR: NEGATIVE
Influenza B by PCR: NEGATIVE
Resp Syncytial Virus by PCR: NEGATIVE
SARS Coronavirus 2 by RT PCR: NEGATIVE

## 2024-05-01 LAB — BASIC METABOLIC PANEL WITH GFR
Anion gap: 13 (ref 5–15)
BUN: 8 mg/dL (ref 8–23)
CO2: 21 mmol/L — ABNORMAL LOW (ref 22–32)
Calcium: 8.8 mg/dL — ABNORMAL LOW (ref 8.9–10.3)
Chloride: 103 mmol/L (ref 98–111)
Creatinine, Ser: 0.77 mg/dL (ref 0.61–1.24)
GFR, Estimated: >60 mL/min (ref >60)
Glucose, Bld: 101 mg/dL — ABNORMAL HIGH (ref 70–99)
Potassium: 4.3 mmol/L (ref 3.5–5.1)
Sodium: 136 mmol/L (ref 135–145)

## 2024-05-01 LAB — TROPONIN T, HIGH SENSITIVITY
Troponin T High Sensitivity: 20 ng/L — ABNORMAL HIGH (ref 0–19)
Troponin T High Sensitivity: 20 ng/L — ABNORMAL HIGH (ref 0–19)

## 2024-05-01 LAB — MAGNESIUM: Magnesium: 2 mg/dL (ref 1.7–2.4)

## 2024-05-01 LAB — PRO BRAIN NATRIURETIC PEPTIDE: Pro Brain Natriuretic Peptide: 5875 pg/mL — ABNORMAL HIGH (ref <300.0)

## 2024-05-01 MED ORDER — ACETAMINOPHEN 650 MG RE SUPP: 650.0000 mg | Freq: Four times a day (QID) | RECTAL | Status: DC | PRN

## 2024-05-01 MED ORDER — METHYLPREDNISOLONE SODIUM SUCC 125 MG IJ SOLR
125.0000 mg | Freq: Once | INTRAMUSCULAR | Status: AC
  Administered 2024-05-01: 125 mg via INTRAVENOUS
  Filled 2024-05-01: qty 2

## 2024-05-01 MED ORDER — DIGOXIN 125 MCG PO TABS
62.5000 ug | ORAL_TABLET | Freq: Every day | ORAL | Status: DC
  Administered 2024-05-02 – 2024-05-10 (×9): 62.5 ug via ORAL
  Filled 2024-05-01 (×9): qty 0.5

## 2024-05-01 MED ORDER — METOPROLOL TARTRATE 5 MG/5ML IV SOLN: 5.0000 mg | INTRAVENOUS | Status: DC | PRN

## 2024-05-01 MED ORDER — DILTIAZEM HCL 25 MG/5ML IV SOLN
15.0000 mg | Freq: Once | INTRAVENOUS | Status: AC
  Administered 2024-05-01: 15 mg via INTRAVENOUS
  Filled 2024-05-01: qty 5

## 2024-05-01 MED ORDER — SODIUM CHLORIDE 0.9 % IV SOLN
100.0000 mg | Freq: Once | INTRAVENOUS | Status: AC
  Administered 2024-05-01: 100 mg via INTRAVENOUS
  Filled 2024-05-01: qty 100

## 2024-05-01 MED ORDER — METOPROLOL TARTRATE 50 MG PO TABS
50.0000 mg | ORAL_TABLET | Freq: Once | ORAL | Status: AC
  Administered 2024-05-01: 50 mg via ORAL
  Filled 2024-05-01: qty 1

## 2024-05-01 MED ORDER — FUROSEMIDE 10 MG/ML IJ SOLN
40.0000 mg | Freq: Once | INTRAMUSCULAR | Status: AC
  Administered 2024-05-01: 40 mg via INTRAVENOUS
  Filled 2024-05-01: qty 4

## 2024-05-01 MED ORDER — SODIUM CHLORIDE 0.9% FLUSH
3.0000 mL | Freq: Two times a day (BID) | INTRAVENOUS | Status: DC
  Administered 2024-05-01 – 2024-05-04 (×7): 3 mL via INTRAVENOUS

## 2024-05-01 MED ORDER — EMPAGLIFLOZIN 10 MG PO TABS
10.0000 mg | ORAL_TABLET | Freq: Every day | ORAL | Status: DC
  Administered 2024-05-02 – 2024-05-10 (×8): 10 mg via ORAL
  Filled 2024-05-01 (×10): qty 1

## 2024-05-01 MED ORDER — PANTOPRAZOLE SODIUM 40 MG PO TBEC
40.0000 mg | DELAYED_RELEASE_TABLET | Freq: Two times a day (BID) | ORAL | Status: DC
  Administered 2024-05-01 – 2024-05-10 (×18): 40 mg via ORAL
  Filled 2024-05-01 (×18): qty 1

## 2024-05-01 MED ORDER — TRAMADOL HCL 50 MG PO TABS
50.0000 mg | ORAL_TABLET | Freq: Four times a day (QID) | ORAL | Status: DC | PRN
  Administered 2024-05-01 – 2024-05-07 (×20): 50 mg via ORAL
  Filled 2024-05-01 (×21): qty 1

## 2024-05-01 MED ORDER — ACETAMINOPHEN 325 MG PO TABS
650.0000 mg | ORAL_TABLET | Freq: Four times a day (QID) | ORAL | Status: DC | PRN
  Administered 2024-05-05 – 2024-05-07 (×4): 650 mg via ORAL
  Filled 2024-05-01 (×4): qty 2

## 2024-05-01 MED ORDER — SENNOSIDES-DOCUSATE SODIUM 8.6-50 MG PO TABS
1.0000 | ORAL_TABLET | Freq: Every evening | ORAL | Status: DC | PRN
  Administered 2024-05-10: 1 via ORAL
  Filled 2024-05-01: qty 1

## 2024-05-01 MED ORDER — APIXABAN 5 MG PO TABS
5.0000 mg | ORAL_TABLET | Freq: Two times a day (BID) | ORAL | Status: DC
  Administered 2024-05-01 – 2024-05-10 (×18): 5 mg via ORAL
  Filled 2024-05-01 (×18): qty 1

## 2024-05-01 MED ORDER — ATORVASTATIN CALCIUM 20 MG PO TABS
40.0000 mg | ORAL_TABLET | Freq: Every day | ORAL | Status: DC
  Administered 2024-05-01 – 2024-05-10 (×10): 40 mg via ORAL
  Filled 2024-05-01 (×10): qty 2

## 2024-05-01 MED ORDER — TRAMADOL HCL 50 MG PO TABS
50.0000 mg | ORAL_TABLET | Freq: Once | ORAL | Status: AC
  Administered 2024-05-01: 50 mg via ORAL
  Filled 2024-05-01: qty 1

## 2024-05-01 MED ORDER — IPRATROPIUM-ALBUTEROL 0.5-2.5 (3) MG/3ML IN SOLN
9.0000 mL | Freq: Once | RESPIRATORY_TRACT | Status: AC
  Administered 2024-05-01: 9 mL via RESPIRATORY_TRACT
  Filled 2024-05-01: qty 9

## 2024-05-01 NOTE — ED Notes (Signed)
 Pt being monitored by CCMD

## 2024-05-01 NOTE — ED Notes (Signed)
 Urinal given

## 2024-05-01 NOTE — ED Provider Notes (Signed)
 [facility] Provider Note    Event Date/Time   First MD Initiated Contact with Patient 05/01/24 1501     (approximate)   History   Shortness of Breath   HPI  Eugene Henderson is a 73 y.o. male with history of CHF, A-fib on Eliquis , AAA, chronic back pain, COPD, presenting with shortness of breath.  States that is intermittent, worse when he is laying flat, states that he will take a couple steps now and feel very out of breath.  States that he has a cough at baseline but is getting worse and more productive.  No fever, notes some mild chest pain when he coughs.  No abdominal pain.  States that he has not taken his pain medications today for his chronic back pain.  He denies any new weakness or numbness, no new falls or trauma.  No nausea vomiting or diarrhea or urinary symptoms.  No incontinence or saddle anesthesia.  Patient states that he has been taking his diuretics as prescribed, but missed his metoprolol  today.  On independent chart review, he was seen and admitted in November for orthostasis and hypotension thought secondary to dehydration and ongoing diuretic use.  He had his home Lasix  held as well as his Entresto  and Aldactone .  Patient states that he has still been taking his diuretics because they are in his pillbox.     Physical Exam   Triage Vital Signs: ED Triage Vitals  Encounter Vitals Group     BP 05/01/24 1256 (!) 144/105     Girls Systolic BP Percentile --      Girls Diastolic BP Percentile --      Boys Systolic BP Percentile --      Boys Diastolic BP Percentile --      Pulse Rate 05/01/24 1257 (!) 103     Resp 05/01/24 1255 (!) 21     Temp 05/01/24 1255 98.7 F (37.1 C)     Temp src --      SpO2 05/01/24 1256 96 %     Weight 05/01/24 1253 280 lb (127 kg)     Height 05/01/24 1253 6' (1.829 m)     Head Circumference --      Peak Flow --      Pain Score 05/01/24 1253 5     Pain Loc --      Pain Education --      Exclude from  Growth Chart --     Most recent vital signs: Vitals:   05/01/24 1643 05/01/24 1700  BP: (!) 160/100   Pulse: (!) 114   Resp:    Temp:  97.6 F (36.4 C)  SpO2:       General: Awake, no distress.  CV:  Good peripheral perfusion.  Resp:  Normal effort.  Diminished breath sounds with sparse wheezing bilaterally Abd:  No distention.  Soft nontender Other:  No obvious lower extremity edema, no unilateral calf sign or tenderness, no focal weakness or numbness.   ED Results / Procedures / Treatments   Labs (all labs ordered are listed, but only abnormal results are displayed) Labs Reviewed  BASIC METABOLIC PANEL WITH GFR - Abnormal; Notable for the following components:      Result Value   CO2 21 (*)    Glucose, Bld 101 (*)    Calcium  8.8 (*)    All other components within normal limits  CBC - Abnormal; Notable for the following components:   RBC 3.99 (*)  Hemoglobin 11.4 (*)    HCT 34.9 (*)    RDW 15.6 (*)    All other components within normal limits  PRO BRAIN NATRIURETIC PEPTIDE - Abnormal; Notable for the following components:   Pro Brain Natriuretic Peptide 5,875.0 (*)    All other components within normal limits  TROPONIN T, HIGH SENSITIVITY - Abnormal; Notable for the following components:   Troponin T High Sensitivity 20 (*)    All other components within normal limits  TROPONIN T, HIGH SENSITIVITY - Abnormal; Notable for the following components:   Troponin T High Sensitivity 20 (*)    All other components within normal limits  RESP PANEL BY RT-PCR (RSV, FLU A&B, COVID)  [id number]     EKG  EKG shows, atrial fibrillation with RVR, rate 103, normal QRS, normal QTc, no obvious ischemic ST elevation, T wave flattening to lateral leads, not significant change compared to prior   RADIOLOGY On my independent interpretation, chest x-ray without obvious consolidation   PROCEDURES:  Critical Care performed: No  .Critical Care  Performed by: Waymond Lorelle Cummins,  MD Authorized by: Waymond Lorelle Cummins, MD   Critical care provider statement:    Critical care time (minutes):  40   Critical care was time spent personally by me on the following activities:  Development of treatment plan with patient or surrogate, discussions with consultants, evaluation of patient's response to treatment, examination of patient, ordering and review of laboratory studies, ordering and review of radiographic studies, ordering and performing treatments and interventions, pulse oximetry, re-evaluation of patient's condition and review of old charts    MEDICATIONS ORDERED IN ED: Medications  ipratropium-albuterol  (DUONEB) 0.5-2.5 (3) MG/3ML nebulizer solution 9 mL (9 mLs Nebulization Given 05/01/24 1541)  furosemide  (LASIX ) injection 40 mg (40 mg Intravenous Given 05/01/24 1617)  methylPREDNISolone  sodium succinate (SOLU-MEDROL ) 125 mg/2 mL injection 125 mg (125 mg Intravenous Given 05/01/24 1614)  doxycycline  (VIBRAMYCIN ) 100 mg in sodium chloride  0.9 % 250 mL IVPB (0 mg Intravenous Stopped 05/01/24 1834)  traMADol  (ULTRAM ) tablet 50 mg (50 mg Oral Given 05/01/24 1630)  metoprolol  tartrate (LOPRESSOR ) tablet 50 mg (50 mg Oral Given 05/01/24 1643)  diltiazem  (CARDIZEM ) injection 15 mg (15 mg Intravenous Given 05/01/24 1815)     IMPRESSION / MDM / ASSESSMENT AND PLAN / ED COURSE  I reviewed the triage vital signs and the nursing notes.                              Differential diagnosis includes, but is not limited to, CHF exacerbation, COPD, viral illness, pneumonia, did consider PE but patient is on Eliquis  and states that he has been compliant, no unilateral calf swelling or tenderness.  Will get labs, EKG, troponin, chest x-ray.  Will give him some DuoNebs here, doxycycline  for COPD exacerbation as well as Solu-Medrol .    Patient's presentation is most consistent with acute presentation with potential threat to life or bodily function.  Independent interpretation of labs and  imaging below.  On reassessment patient states that he that he missed his metoprolol  this morning, will give him his metoprolol  here.  Return viral panel is negative.  Patient heart rate go from 100s to 120s, A-fib RVR, will give a dose of IV Dilt here.  After IV Dilt, heart rate now in the 90s to 100s.  States that he still feels slightly short of breath although improved after DuoNebs.  Given the A-fib RVR, CHF exacerbation  and COPD exacerbation, he will need to be admitted for further management.  Will consult hospitalist.  Consult to hospitalist, he is admitted.  The patient is on the cardiac monitor to evaluate for evidence of arrhythmia and/or significant heart rate changes.   Clinical Course as of 05/01/24 1900  Tue May 01, 2024  1524 DG Chest 2 View Moderate cardiomegaly.  No pneumonia or pulmonary edema.  [TT]  1549 Independent review of labs, initial Cam is mildly low later, electrolytes are severely deranged, no leukocytosis, BNP is elevated.  Will give him a dose of IV Lasix  here. [TT]  1609 Troponin T, High Sensitivity(!) Troponin x 2 stable. [TT]  1650 Resp panel by RT-PCR (RSV, Flu A&B, Covid) Anterior Nasal Swab Negative [TT]  1812 Patient was given his home metoprolol , heart rate go from low 100s to 120s.  Will give him a dose of IV Dilt here. [TT]    Clinical Course User Index [TT] Waymond Lorelle Cummins, MD     FINAL CLINICAL IMPRESSION(S) / ED DIAGNOSES   Final diagnoses:  COPD exacerbation (HCC)  SOB (shortness of breath)  Atrial fibrillation with rapid ventricular response (HCC)  Acute on chronic congestive heart failure, unspecified heart failure type (HCC)  Cough, unspecified type     Rx / DC Orders   ED Discharge Orders     None        Note:  This document was prepared using Dragon voice recognition software and may include unintentional dictation errors.    Waymond Lorelle Cummins, MD 05/01/24 [phone]

## 2024-05-01 NOTE — ED Notes (Signed)
 Provided box lunch and water

## 2024-05-01 NOTE — Telephone Encounter (Signed)
 Requested Prescriptions  Pending Prescriptions Disp Refills   atorvastatin  (LIPITOR) 40 MG tablet [Pharmacy Med Name: ATORVASTATIN  CALCIUM  40 MG Oral Tablet] 90 tablet 1    Sig: TAKE 1 TABLET EVERY DAY     Cardiovascular:  Antilipid - Statins Failed - 05/01/2024 10:13 AM      Failed - Lipid Panel in normal range within the last 12 months    Cholesterol, Total  Date Value Ref Range Status  02/09/2024 129 100 - 199 mg/dL Final   LDL Chol Calc (NIH)  Date Value Ref Range Status  02/09/2024 42 0 - 99 mg/dL Final   HDL  Date Value Ref Range Status  02/09/2024 64 >39 mg/dL Final   Triglycerides  Date Value Ref Range Status  02/09/2024 137 0 - 149 mg/dL Final         Passed - Patient is not pregnant      Passed - Valid encounter within last 12 months    Recent Outpatient Visits           1 month ago Diabetes mellitus with cardiac complication (HCC)   [place] [facility] [place], Megan P, DO   2 months ago Encounter for [employer] annual wellness exam   [place] [facility] [place], Megan P, DO   3 months ago Dysuria   [place] [facility] [place], Parris LABOR, MD   4 months ago Wheezing   [place] [facility] [place], [place] P, DO   5 months ago Diabetes mellitus with cardiac complication [facility])   [place] [facility] [place], Duwaine SQUIBB, DO       Future Appointments             Today Furth, Cadence H, PA-C [facility] [place]   In 1 month Furth, Cadence H, PA-C [place] [facility] [employer]             metFORMIN  (GLUCOPHAGE ) 500 MG tablet [Pharmacy Med Name: METFORMIN  HYDROCHLORIDE 500 MG Oral Tablet] 180 tablet 1    Sig: TAKE 1 TABLET TWICE DAILY WITH MEALS     Endocrinology:  Diabetes - Biguanides Failed - 05/01/2024 10:13 AM      Failed - B12 Level in normal range and within 720 days    Vitamin B-12  Date Value Ref Range Status  06/18/2021 304 232 - 1,245  pg/mL Final         Passed - Cr in normal range and within 360 days    Creatinine, Ser  Date Value Ref Range Status  04/14/2024 0.82 0.61 - 1.24 mg/dL Final         Passed - HBA1C is between 0 and 7.9 and within 180 days    HB A1C (BAYER DCA - WAIVED)  Date Value Ref Range Status  02/09/2024 6.9 (H) 4.8 - 5.6 % Final    Comment:             Prediabetes: 5.7 - 6.4          Diabetes: >6.4          Glycemic control for adults with diabetes: <7.0          Passed - eGFR in normal range and within 360 days    GFR calc Af Amer  Date Value Ref Range Status  03/28/2020 96 >59 mL/min/1.73 Final    Comment:    **In accordance with recommendations from the NKF-ASN Task force,**   [facility] is in  the process of updating its eGFR calculation to the   2021 CKD-EPI creatinine equation that estimates kidney function   without a race variable.    GFR, Estimated  Date Value Ref Range Status  04/14/2024 >60 >60 mL/min Final    Comment:    (NOTE) Calculated using the CKD-EPI Creatinine Equation (2021)    eGFR  Date Value Ref Range Status  02/09/2024 72 >59 mL/min/1.73 Final         Passed - Valid encounter within last 6 months    Recent Outpatient Visits           1 month ago Diabetes mellitus with cardiac complication (HCC)   [place] [facility] [place], Megan P, DO   2 months ago Encounter for [employer] annual wellness exam   [place] [facility] [place], Megan P, DO   3 months ago Dysuria   [place] [facility] [place], Parris LABOR, MD   4 months ago Wheezing   [place] [facility] [place], [place] P, DO   5 months ago Diabetes mellitus with cardiac complication [facility])   [place] [facility] [place], Duwaine SQUIBB, DO       Future Appointments             Today Furth, Cadence H, PA-C [place] [place]   In 1 month Furth, Cadence H, PA-C  [facility] at [employer] - CBC within normal limits and completed in the last 12 months    WBC  Date Value Ref Range Status  04/12/2024 3.8 (L) 4.0 - 10.5 K/uL Final   RBC  Date Value Ref Range Status  04/12/2024 4.86 4.22 - 5.81 MIL/uL Final   Hemoglobin  Date Value Ref Range Status  04/12/2024 14.1 13.0 - 17.0 g/dL Final  90/69/7974 85.4 13.0 - 17.7 g/dL Final   HCT  Date Value Ref Range Status  04/12/2024 41.3 39.0 - 52.0 % Final   Hematocrit  Date Value Ref Range Status  03/06/2024 43.1 37.5 - 51.0 % Final   MCHC  Date Value Ref Range Status  04/12/2024 34.1 30.0 - 36.0 g/dL Final   [facility]  Date Value Ref Range Status  04/12/2024 29.0 26.0 - 34.0 pg Final   MCV  Date Value Ref Range Status  04/12/2024 85.0 80.0 - 100.0 fL Final  03/06/2024 86 79 - 97 fL Final   No results found for: PLTCOUNTKUC, LABPLAT, POCPLA RDW  Date Value Ref Range Status  04/12/2024 14.9 11.5 - 15.5 % Final  03/06/2024 16.0 (H) 11.6 - 15.4 % Final

## 2024-05-01 NOTE — Progress Notes (Deleted)
  Cardiology Office Note   Date:  05/01/2024  ID:  Eugene Henderson, DOB Jul 28, 1950, MRN [id number] PCP: Eugene Duwaine SQUIBB, DO  [facility] Providers Cardiologist:  Eugene Cage, MD Electrophysiologist:  Eugene ONEIDA HOLTS, MD { Click to update primary MD,subspecialty MD or APP then REFRESH:1}    History of Present Illness Eugene Henderson is a 73 y.o. male with a h/o with a h/o NICM based on cath in 2018, HTN, DM2, HFrEF s/p ICD, permanent Afib, tobacco use and alcohol use who presents for hospital follow-up.    He was admitted 06/14/22 with SOB and chest pain. He was noted to be in afib RVR and underwent DCCV to NSR. 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 Echo 06/2022 showed LVEF 20-25% with severe RV dysfunction. Cardiac MRI 2/24 showed LVEF 17%, RVEF 36%, small area of lateral wall subendocardial LGE, mid-wall LGE at this inferoseptal RV insertion site. There was a very small area of possible prior MI, but this study suggested primarily NICM.    RHC 09/2022 showed normal filling pressures with mildly decreased CI at 2.2. he failed DCCV on amiodarone  09/2022. Biotronik ICD was placed 10/2022.    He has been following with heart failure and EP. He saw Dr. Holts 08/2023 reporting chest pain. PET stress was ordered which was abnormal partially reversible location. EF 30-35%. He was asked to come in to discuss heart cath. R/L heart cath 12/16/23 showed minor irregularities with no evidence of CAD. RHC showed moderately elevated right atrial pressure, moderate pulmonary HTN and normal CO.   The patient was admitted 04/2024 with near syncope and orthostatic hypotension in the setting of 1 week history if diarrhea with decreased oral intake. In the ER systolic was dropping from 107 to 84. Other vitals were unremarkable. The patient was hydrated. Lasix , entresto  and  spironolactone  were held. Toprol  was reduced to 50mg  daily.     ROS: ***  Studies Reviewed      *** Risk Assessment/Calculations {Does this patient have ATRIAL FIBRILLATION?[phone]} No BP recorded.  {Refresh Note OR Click here to enter BP  :1}***       Physical Exam VS:  There were no vitals taken for this visit.       Wt Readings from Last 3 Encounters:  04/16/24 275 lb (124.7 kg)  04/14/24 275 lb 6.4 oz (124.9 kg)  03/20/24 284 lb 9.6 oz (129.1 kg)    GEN: Well nourished, well developed in no acute distress NECK: No JVD; No carotid bruits CARDIAC: ***RRR, no murmurs, rubs, gallops RESPIRATORY:  Clear to auscultation without rales, wheezing or rhonchi  ABDOMEN: Soft, non-tender, non-distended EXTREMITIES:  No edema; No deformity   ASSESSMENT AND PLAN ***    {Are you ordering a CV Procedure (e.g. stress test, cath, DCCV, TEE, etc)?   Press F2        :[id number]}  Dispo: ***  Signed, Eugene Gulotta VEAR Fishman, PA-C

## 2024-05-01 NOTE — ED Triage Notes (Signed)
 Pt to ED [facility] from home for sob started this am, woke out of sleep. Hx afib, asthma, CHF. HR 108-150 Shob worse when lying flat

## 2024-05-01 NOTE — ED Notes (Signed)
Meal given per MD.  

## 2024-05-01 NOTE — H&P (Signed)
 History and Physical    Eugene Henderson:[id number] DOB: 18-Feb-1951 DOA: 05/01/2024  DOS: the patient was seen and examined on 05/01/2024  PCP: Louder Duwaine SQUIBB, DO   Patient coming from: Home  I have personally briefly reviewed patient's old medical records in [facility] and CareEverywhere  HPI:   Eugene Henderson is a 73 y.o. year old male with medical history of HTN, HLD, T2DM, HFrEF (recent echo with EF 40 %), COPD presenting with worsening shortness of breath.   Pt states he woke up Tuesday 4am and started having shortness of breath. He used his inhalers but they were not helping. He used them multiple times. When these were not helping he called EMS. No fevers or chills but has been coughing with sputum production.   He has been having some shortness of breath over the last week and has been worsening. He always has dyspnea on exertion limiting him to 20-30 feet but it has worsened significantly over the last few days.    On arrival to the ED patient was noted to be HDS stable. Lab work and imaging obtained. CBC without leukocytosis, mild anemia that is normocytic, BMP with NAGMA, but otherwise unremarkable, mildly elevated proBNP, troponin elevated but stable at 20. CXR clear without any acute ST changes, Pt noted to be in RVR and IV pushes of dilt and metoprolol  given. Given his RVR status, [facility] contacted for admission.  Review of Systems: As mentioned in the history of present illness. All other systems reviewed and are negative.   Past Medical History:  Diagnosis Date   Arthritis    Ascending aortic aneurysm    a. 09/2017 Stable TAA - 5.1cm.   Asthma    Chronic systolic CHF (congestive heart failure) (HCC)    a. EF 25-30% by echo in 07/2016 with cath showing no significant CAD b. 01/2017: EF 30-35% with diffuse HK and moderate MR; c. 07/2017 Echo: EF 30-35%, diff hK. Mild MR, mildly dil LA. PASP .   Diverticulitis    Diverticulitis of large intestine with  perforation without abscess or bleeding 05/13/2017   Hypertension    Hyperthyroidism    NICM (nonischemic cardiomyopathy) (HCC)    Noncompliance    Persistent atrial fibrillation (HCC)    a. CHA2DS2VASc = 3-->Eliquis  (? compliance).    Past Surgical History:  Procedure Laterality Date   CARDIOVERSION N/A 06/23/2022   Procedure: CARDIOVERSION;  Surgeon: Gardenia Led, DO;  Location: [facility] ORS;  Service: Cardiovascular;  Laterality: N/A;   CARDIOVERSION N/A 06/25/2022   Procedure: CARDIOVERSION;  Surgeon: Gardenia Led, DO;  Location: [facility] ORS;  Service: Cardiovascular;  Laterality: N/A;   CARDIOVERSION N/A 09/29/2022   Procedure: CARDIOVERSION;  Surgeon: Rolan Ezra RAMAN, MD;  Location: [facility] INVASIVE CV LAB;  Service: Cardiovascular;  Laterality: N/A;   COLONOSCOPY N/A 08/27/2019   Procedure: COLONOSCOPY;  Surgeon: Toledo, Ladell POUR, MD;  Location: [facility] ENDOSCOPY;  Service: Gastroenterology;  Laterality: N/A;   ENDOVASCULAR STENT GRAFT (AAA) N/A 06/17/2022   Procedure: ENDOVASCULAR REPAIR/STENT GRAFT;  Surgeon: Marea Selinda RAMAN, MD;  Location: [facility] INVASIVE CV LAB;  Service: Cardiovascular;  Laterality: N/A;   ICD IMPLANT N/A 11/03/2022   Procedure: ICD IMPLANT;  Surgeon: Cindie Ole DASEN, MD;  Location[facility] INVASIVE CV LAB;  Service: Cardiovascular;  Laterality: N/A;   JOINT REPLACEMENT Right    RIGHT HEART CATH Right 09/21/2022   Procedure: RIGHT HEART CATH;  Surgeon: Rolan Ezra RAMAN, MD;  Location: [facility] INVASIVE CV LAB;  Service: Cardiovascular;  Laterality: Right;   RIGHT/LEFT HEART CATH AND CORONARY ANGIOGRAPHY N/A 08/02/2016   Procedure: Right/Left Heart Cath and Coronary Angiography;  Surgeon: Deatrice DELENA Cage, MD;  Location: [facility] INVASIVE CV LAB;  Service: Cardiovascular;  Laterality: N/A;   RIGHT/LEFT HEART CATH AND CORONARY ANGIOGRAPHY Bilateral 12/16/2023   Procedure: RIGHT/LEFT HEART CATH AND CORONARY ANGIOGRAPHY;  Surgeon: Cage Deatrice DELENA, MD;  Location: [facility] INVASIVE CV LAB;   Service: Cardiovascular;  Laterality: Bilateral;   TEE WITHOUT CARDIOVERSION N/A 06/23/2022   Procedure: TRANSESOPHAGEAL ECHOCARDIOGRAM;  Surgeon: Gardenia Led, DO;  Location: [facility] ORS;  Service: Cardiovascular;  Laterality: N/A;   TESTICLE SURGERY     Patient states that he had to have the tube fixed.     No Known Allergies  Family History  Problem Relation Age of Onset   Diabetes Mother    Diabetes Father    Heart disease Brother    Heart attack Brother    Diabetes Brother    Kidney failure Brother    Thyroid  disease Neg Hx     Prior to Admission medications   Medication Sig Start Date End Date Taking? Authorizing Provider  acetaminophen  (TYLENOL ) 500 MG tablet Take 1-2 tablets (500-1,000 mg total) by mouth every 6 (six) hours as needed for mild pain (pain score 1-3), fever or headache. Patient taking differently: Take 1,000 mg by mouth 2 (two) times daily. 04/12/23   Barkey, Megan P, DO  albuterol  (VENTOLIN  HFA) 108 (90 Base) MCG/ACT inhaler Inhale 1-2 puffs into the lungs every 6 (six) hours as needed for wheezing or shortness of breath. 11/09/23   Lemme, Megan P, DO  atorvastatin  (LIPITOR) 40 MG tablet TAKE 1 TABLET EVERY DAY 05/01/24   Landen, Megan P, DO  Blood Glucose Monitoring Suppl DEVI 1 each by Does not apply route as directed. Dispense based on patient and insurance preference. Use up to four times daily as directed. (FOR ICD-10 E10.9, E11.9). 04/12/24   Melvin Pao, NP  Blood Pressure Monitoring (BLOOD PRESSURE CUFF) MISC Check blood pressure as instructed by your physician 12/08/23   Franchester, Cadence H, PA-C  digoxin  (LANOXIN ) 0.125 MG tablet TAKE 1/2 TABLET BY MOUTH EVERY DAY 11/07/23   Cindie Ole DASEN, MD  ELIQUIS  5 MG TABS tablet TAKE 1 TABLET TWICE DAILY 04/30/24   Cindie Ole DASEN, MD  empagliflozin  (JARDIANCE ) 10 MG TABS tablet TAKE 1 TABLET EVERY DAY 04/06/24   Heavin, Megan P, DO  folic acid  (FOLVITE ) 1 MG tablet Take 1 tablet (1 mg total) by mouth  daily. 11/09/23   Biever, Megan P, DO  furosemide  (LASIX ) 40 MG tablet Take 1 tablet (40 mg total) by mouth daily. 11/03/23   Cindie Ole DASEN, MD  Glucose Blood (BLOOD GLUCOSE TEST STRIPS) STRP 1 each by Does not apply route as directed. Dispense based on patient and insurance preference. Use up to four times daily as directed. (FOR ICD-10 E10.9, E11.9). 04/12/24   Melvin Pao, NP  Lancet Device MISC 1 each by Does not apply route as directed. Dispense based on patient and insurance preference. Use up to four times daily as directed. (FOR ICD-10 E10.9, E11.9). 04/12/24   Melvin Pao, NP  Lancets MISC 1 each by Does not apply route as directed. Dispense based on patient and insurance preference. Use up to four times daily as directed. (FOR ICD-10 E10.9, E11.9). 04/12/24   Melvin Pao, NP  metFORMIN  (GLUCOPHAGE ) 500 MG tablet TAKE 1 TABLET TWICE DAILY WITH MEALS 05/01/24   Vicci Bouchard P, DO  metoprolol  succinate (TOPROL -XL) 50 MG 24 hr tablet Take 1 tablet (50 mg total) by mouth daily. Reduced from 150 mg. 04/14/24   Awanda City, MD  nitroGLYCERIN  (NITROSTAT ) 0.4 MG SL tablet Place 1 tablet (0.4 mg total) under the tongue every 5 (five) minutes as needed for chest pain. This may be repeated twice and please call 911 when taking the third dose. 12/08/23 04/16/24  Furth, Cadence H, PA-C  pantoprazole  (PROTONIX ) 40 MG tablet Take 1 tablet (40 mg total) by mouth 2 (two) times daily. 11/09/23   Amon, Megan P, DO  potassium chloride  SA (KLOR-CON  M) 20 MEQ tablet TAKE 1 TABLET BY MOUTH EVERY DAY 03/09/24   Pirozzi, Megan P, DO  sacubitril -valsartan  (ENTRESTO ) 49-51 MG Take 1 tablet by mouth 2 (two) times daily. 12/08/23   Furth, Cadence H, PA-C  Semaglutide  (RYBELSUS ) 7 MG TABS Take 1 tablet (7 mg total) by mouth daily. 03/08/24   Madan, Megan P, DO  spironolactone  (ALDACTONE ) 25 MG tablet Take 1 tablet (25 mg total) by mouth daily. 11/09/23   Castell, Megan P, DO  Tiotropium Bromide-Olodaterol  (STIOLTO RESPIMAT ) 2.5-2.5 MCG/ACT AERS INHALE 2 PUFFS BY MOUTH INTO THE LUNGS DAILY 10/19/23   Isadora Hose, MD  traMADol  (ULTRAM ) 50 MG tablet Take 50 mg by mouth 3 (three) times daily as needed. 04/11/24   [provider]    Social History:  reports that he has been smoking pipe and cigarettes. He has never used smokeless tobacco. He reports current alcohol use of about 2.0 standard drinks of alcohol per week. He reports that he does not use drugs. Lives with daugher in law Tobacco- Smokes 4 cigarettes a day, started smoking at 25 but has been on and off EtOH- drinks regular beer Illicit drug use- denies use.  IADLs/ADLs- can perform independently at baseline    Physical Exam: Vitals:   05/01/24 1830 05/01/24 2050 05/01/24 2130 05/01/24 2330  BP: 121/85  (!) 142/101 (!) 144/92  Pulse: 94  84 92  Resp: (!) 22  (!) 21   Temp:  98.1 F (36.7 C)    TempSrc:  Oral    SpO2: 100%  100% 100%  Weight:      Height:         Gen: NAD HENT: NCAT CV: IRIR, good pulses Resp: mild wheeze present, bibasilar rales Abd: no TTP, normal bowel sounds MSK: no asymmetry Skin: no lesions noted on exam Neuro: alert and oriented x4 Psych:pleasant mood   Labs on Admission: I have personally reviewed following labs and imaging studies  CBC: Recent Labs  Lab 05/01/24 1257  WBC 4.1  HGB 11.4*  HCT 34.9*  MCV 87.5  PLT 179   Basic Metabolic Panel: Recent Labs  Lab 05/01/24 1257  NA 136  K 4.3  CL 103  CO2 21*  GLUCOSE 101*  BUN 8  CREATININE 0.77  CALCIUM  8.8*  MG 2.0   GFR: Estimated Creatinine Clearance: 113.3 mL/min (by C-G formula based on SCr of 0.77 mg/dL). Liver Function Tests: No results for input(s): AST, ALT, ALKPHOS, BILITOT, PROT, ALBUMIN in the last 168 hours. No results for input(s): LIPASE, AMYLASE in the last 168 hours. No results for input(s): AMMONIA in the last 168 hours. Coagulation Profile: No results for input(s): INR,  PROTIME in the last 168 hours. Cardiac Enzymes: No results for input(s): CKTOTAL, CKMB, CKMBINDEX, TROPONINI, TROPONINIHS in the last 168 hours. BNP (last 3 results) Recent Labs    03/06/24 0951  BNP 37.6  HbA1C: No results for input(s): HGBA1C in the last 72 hours. CBG: No results for input(s): GLUCAP in the last 168 hours. Lipid Profile: No results for input(s): CHOL, HDL, LDLCALC, TRIG, CHOLHDL, LDLDIRECT in the last 72 hours. Thyroid  Function Tests: No results for input(s): TSH, T4TOTAL, FREET4, T3FREE, THYROIDAB in the last 72 hours. Anemia Panel: No results for input(s): VITAMINB12, FOLATE, FERRITIN, TIBC, IRON, RETICCTPCT in the last 72 hours. Urine analysis:    Component Value Date/Time   COLORURINE YELLOW (A) 04/13/2024 0025   APPEARANCEUR CLOUDY (A) 04/13/2024 0025   APPEARANCEUR Cloudy (A) 11/09/2023 0928   LABSPEC 1.010 04/13/2024 0025   PHURINE 5.0 04/13/2024 0025   GLUCOSEU >=500 (A) 04/13/2024 0025   HGBUR LARGE (A) 04/13/2024 0025   BILIRUBINUR NEGATIVE 04/13/2024 0025   BILIRUBINUR negative 01/04/2024 1545   BILIRUBINUR Negative 11/09/2023 0928   KETONESUR NEGATIVE 04/13/2024 0025   PROTEINUR NEGATIVE 04/13/2024 0025   UROBILINOGEN 0.2 01/04/2024 1545   NITRITE NEGATIVE 04/13/2024 0025   LEUKOCYTESUR MODERATE (A) 04/13/2024 0025    Radiological Exams on Admission: I have personally reviewed images DG Chest 2 View Result Date: 05/01/2024 CLINICAL DATA:  shob EXAM: DG CHEST 2V COMPARISON:  Nov 03, 2022 FINDINGS: No focal airspace consolidation, pleural effusion, or pneumothorax. Moderate cardiomegaly. Left chest AICD with a single lead terminating in the right ventricle. No acute fracture or destructive lesions. Multilevel thoracic osteophytosis. IMPRESSION: Moderate cardiomegaly.  No pneumonia or pulmonary edema. Electronically Signed   By: Rogelia Myers M.D.   On: 05/01/2024 15:09    EKG: My personal  interpretation of EKG shows: A fib with RVR but no ST changes.     Assessment/Plan Principal Problem:   Atrial fibrillation with rapid ventricular response (HCC) Active Problems:   Persistent atrial fibrillation (HCC)   Essential hypertension   Tobacco use   COPD (chronic obstructive pulmonary disease) (HCC)   Hyperlipidemia   Patient with atrial fibrillation with RVR.  Patient missed his metoprolol  today.  This was further worsened by him repeatedly using his inhaler for his shortness of breath despite it not helping his breathing.  EDP gave him IV Dilt push and restarted his metoprolol  with improvement in his rate.  Patient is hemodynamically stable and rates have improved.  Will continue to monitor his rates with tentative plan to restart his regimen tomorrow.  COPD exacerbation: Given his cough with sputum production and shortness of breath he has multiple cardinal signs of COPD exacerbation.  Will give him prednisone  and doxycycline .  Continue nebulizers.  Patient on room air but with dyspnea on exertion as primary complaint.  CHF exacerbation: Patient with component of CHF exacerbation as well.  Patient states he is not sure of his medications as he takes them from a pill pack.  He believes he is taking Lasix  which was held last admission given he had orthostatic hypotension.  Patient has responded well to IV Lasix .  His weight at discharge from previous hospitalization was 124 kg and today is 127 kg suggestive of mild to moderate volume overload.  Will continue IV diuresis for 1 more day.    Tobacco use disorder: Advised on complete cessation of tobacco use as it can lead to COPD exacerbation.  Type 2 diabetes Mellitus: No recent A1c in chart.  Will repeat A1c this admission.  Monitor CBGs daily.  Hyperlipidemia: Continue home statin   VTE prophylaxis:  Eliquis   Diet: HH Code Status:  Full Code Telemetry:  Admission status: Inpatient, Progressive Patient is from:  Home Anticipated d/c is to: Home Anticipated d/c is in: 1-2 days   Family Communication: Updated at bedside  Consults called: None   Severity of Illness: The appropriate patient status for this patient is INPATIENT. Inpatient status is judged to be reasonable and necessary in order to provide the required intensity of service to ensure the patient's safety. The patient's presenting symptoms, physical exam findings, and initial radiographic and laboratory data in the context of their chronic comorbidities is felt to place them at high risk for further clinical deterioration. Furthermore, it is not anticipated that the patient will be medically stable for discharge from the hospital within 2 midnights of admission.   * I certify that at the point of admission it is my clinical judgment that the patient will require inpatient hospital care spanning beyond 2 midnights from the point of admission due to high intensity of service, high risk for further deterioration and high frequency of surveillance required.DEWAINE Morene Bathe, MD [facility] DEL. [facility]

## 2024-05-02 DIAGNOSIS — I4891 Unspecified atrial fibrillation: Secondary | ICD-10-CM | POA: Diagnosis not present

## 2024-05-02 LAB — BASIC METABOLIC PANEL WITH GFR
Anion gap: 12 (ref 5–15)
BUN: 12 mg/dL (ref 8–23)
CO2: 22 mmol/L (ref 22–32)
Calcium: 8.9 mg/dL (ref 8.9–10.3)
Chloride: 102 mmol/L (ref 98–111)
Creatinine, Ser: 0.77 mg/dL (ref 0.61–1.24)
GFR, Estimated: >60 mL/min (ref >60)
Glucose, Bld: 157 mg/dL — ABNORMAL HIGH (ref 70–99)
Potassium: 4 mmol/L (ref 3.5–5.1)
Sodium: 136 mmol/L (ref 135–145)

## 2024-05-02 LAB — CBC
HCT: 30 % — ABNORMAL LOW (ref 39.0–52.0)
Hemoglobin: 9.7 g/dL — ABNORMAL LOW (ref 13.0–17.0)
MCH: 28 pg (ref 26.0–34.0)
MCHC: 32.3 g/dL (ref 30.0–36.0)
MCV: 86.7 fL (ref 80.0–100.0)
Platelets: 166 K/uL (ref 150–400)
RBC: 3.46 MIL/uL — ABNORMAL LOW (ref 4.22–5.81)
RDW: 15.3 % (ref 11.5–15.5)
WBC: 2.6 K/uL — ABNORMAL LOW (ref 4.0–10.5)
nRBC: 0 % (ref 0.0–0.2)

## 2024-05-02 LAB — GLUCOSE, CAPILLARY: Glucose-Capillary: 155 mg/dL — ABNORMAL HIGH (ref 70–99)

## 2024-05-02 LAB — HEMOGLOBIN A1C
Hgb A1c MFr Bld: 6.2 % — ABNORMAL HIGH (ref 4.8–5.6)
Mean Plasma Glucose: 131 mg/dL

## 2024-05-02 LAB — CBG MONITORING, ED: Glucose-Capillary: 138 mg/dL — ABNORMAL HIGH (ref 70–99)

## 2024-05-02 MED ORDER — FUROSEMIDE 10 MG/ML IJ SOLN
40.0000 mg | Freq: Two times a day (BID) | INTRAMUSCULAR | Status: AC
  Administered 2024-05-02 (×2): 40 mg via INTRAVENOUS
  Filled 2024-05-02 (×2): qty 4

## 2024-05-02 MED ORDER — DOXYCYCLINE HYCLATE 100 MG PO TABS
100.0000 mg | ORAL_TABLET | Freq: Two times a day (BID) | ORAL | Status: AC
  Administered 2024-05-02 – 2024-05-06 (×10): 100 mg via ORAL
  Filled 2024-05-02 (×10): qty 1

## 2024-05-02 MED ORDER — SACUBITRIL-VALSARTAN 49-51 MG PO TABS
1.0000 | ORAL_TABLET | Freq: Two times a day (BID) | ORAL | Status: DC
  Administered 2024-05-02 – 2024-05-10 (×17): 1 via ORAL
  Filled 2024-05-02 (×19): qty 1

## 2024-05-02 MED ORDER — METOPROLOL SUCCINATE ER 50 MG PO TB24
50.0000 mg | ORAL_TABLET | Freq: Every day | ORAL | Status: DC
  Administered 2024-05-02 – 2024-05-10 (×9): 50 mg via ORAL
  Filled 2024-05-02 (×9): qty 1

## 2024-05-02 NOTE — ED Notes (Signed)
 Called pharmacy about pt's Entresto  not being in pt bin or tube station.

## 2024-05-02 NOTE — Hospital Course (Addendum)
 Eugene Henderson is a 73 y.o. year old male with past medical history of hypertension, hyperlipidemia, type 2 diabetes, heart failure with reduced ejection fraction, COPD presented to hospital with shortness of breath despite using inhalers with cough and sputum production and dyspnea on exertion limiting after 20 to 30 feet.  ASSESSMENT & PLAN:   Atrial fibrillation with RVR - resolved  Likely due to missed metoprolol .  Received IV diltiazem  with some improvement.  Continue Eliquis  digoxin  metoprolol   COPD exacerbation - improved  Status post prednisone  and doxycycline  during hospitalization.  Continue oxygen and incentive spirometer as necessary.     CHF exacerbation -improved 2D echocardiogram was done on 04/26/2024.  Continue metoprolol , entresto , statin, eliquis .   Acute hypoxic respiratory failure Will need oxygen on discharge  Left first [facility] joint arthritis with exacerbation Orthopedics was consulted and he is on thumb spica.  Continue occupational therapy.  Low concern for septic arthritis.  Nondisplaced fracture of the lateral aspect of the radial head.   Plan for sling immobilization of the left upper extremity for 2 to 3 weeks.  Nonweightbearing of the left upper extremity.  Follow-up with orthopedic doctor outpatient. daily pendulum exercises to help prevent stiffness.  Tobacco use disorder Reinforced quitting.   Type 2 diabetes Mellitus well controlled: Will monitor blood glucose levels.   Hyperlipidemia:  Continue statins.   Debility, deconditioning Seen by physical therapy and recommend home health.  Class 2 obesity  Body mass index is 36.39 kg/m.SABRA  Would benefit from lifestyle modification.

## 2024-05-02 NOTE — Progress Notes (Signed)
 Approximately 1750--Pt arrived to room 239B from ED. Upon arrival, VS obtained and full assessment completed by this RN. Telemetry connected and notified. Fall precautions in place.

## 2024-05-02 NOTE — ED Notes (Signed)
 Patient called out requesting his urinals be dumped.

## 2024-05-02 NOTE — Progress Notes (Signed)
 PROGRESS NOTE    Eugene Henderson   FMW:[id number] DOB: 05/17/1951  DOA: 05/01/2024 Date of Service: 05/02/24 which is hospital day 1  PCP: Louder Duwaine SQUIBB, [facility] course / significant events:   Eugene Henderson is a 73 y.o. year old male with medical history of HTN, HLD, T2DM, HFrEF (recent echo with EF 40 %), COPD presenting with worsening shortness of breath.   HPI: Pt states he woke up Tuesday 05/01/24 4am w/ worsening shortness of breath. Inhalers were not helping. He used them multiple times. Called EMS. No fevers or chills but has been coughing with sputum production, some shortness of breath over the last week. He always has dyspnea on exertion limiting him to 20-30 feet but it has worsened significantly over the last few days.    11/25: to ED, Afib RVR, EDP gave him IV Dilt push and restarted his metoprolol  with improvement in his rate.admitted to hospitalist for Afib RVR likely d/t COPD exacerbation (tx steroids, doxycycline , nebs, NOT needing O2), possible CHF exacerbation (IV lasix  and diuresing well w/ this). 11/26: continuing diuresis, out total almost 4L as of midday today     Consultants:  none  Procedures/Surgeries: none      ASSESSMENT & PLAN:   Atrial fibrillation with RVR D/t missed his metoprolol  also worsened by him repeatedly using his inhaler for his shortness of breath  EDP gave him IV Dilt push and restarted his metoprolol  with improvement in his rate.  Restarted home Eliquis , digoxin , metoprolol  Telemetry    COPD exacerbation:  prednisone   doxycycline .   nebulizers.   O2 as needed   CHF exacerbation:  Echo done 04/26/24 He is not sure of his medications  Lasix  held on last admission  IV diuretics Strict I&O GDMT as BP allows, see med rec - metoprolol , entresto , statin, eliwuid    Tobacco use disorder Counseled on cessation   Type 2 diabetes Mellitus: Monitor CBGs daily.   Hyperlipidemia:  home statin       Class 2  obesity based on BMI: Body mass index is 37.97 kg/m.SABRA Significantly low or high BMI is associated with higher medical risk.  Underweight - under 18  overweight - 25 to 29 obese - 30 or more Class 1 obesity: BMI of 30.0 to 34 Class 2 obesity: BMI of 35.0 to 39 Class 3 obesity: BMI of 40.0 to 49 Super Morbid Obesity: BMI 50-59 Super-super Morbid Obesity: BMI 60+ Healthy nutrition and physical activity advised as adjunct to other disease management and risk reduction treatments    DVT prophylaxis: eliquis  IV fluids: no continuous IV fluids  Nutrition: cardiac / carb Central lines / other devices: none  Code Status: FULL CODE ACP documentation reviewed:  none on file in [facility]  TOC needs: TBD Medical barriers to dispo: pending further diuresis. Expected medical readiness for discharge few days.              Subjective / Brief ROS:  Patient reports still some SOB but overall much better Denies CP Pain controlled.  Denies new weakness.  Tolerating diet.  Reports no concerns w/ urination/defecation.   Family Communication: none at this time     Objective Findings:  Vitals:   05/02/24 1609 05/02/24 1630 05/02/24 1700 05/02/24 1751  BP:  (!) 148/105 (!) 139/108 (!) 134/91  Pulse:  84 87 82  Resp:    20  Temp: 98 F (36.7 C)   97.6 F (36.4 C)  TempSrc: Oral  SpO2:  98% 99% 100%  Weight:      Height:        Intake/Output Summary (Last 24 hours) at 05/02/2024 1800 Last data filed at 05/02/2024 9060 Gross per 24 hour  Intake --  Output 3150 ml  Net -3150 ml   Filed Weights   05/01/24 1253  Weight: 127 kg    Examination:  Physical Exam Constitutional:      General: He is not in acute distress. Cardiovascular:     Rate and Rhythm: Normal rate. Rhythm irregular.  Pulmonary:     Breath sounds: Examination of the right-lower field reveals rales. Examination of the left-lower field reveals rales. Wheezing and rales present.  Musculoskeletal:      Right lower leg: Edema present.     Left lower leg: Edema present.  Skin:    General: Skin is warm and dry.  Neurological:     Mental Status: He is alert and oriented to person, place, and time.  Psychiatric:        Behavior: Behavior normal.          Scheduled Medications:   apixaban   5 mg Oral BID   atorvastatin   40 mg Oral Daily   digoxin   62.5 mcg Oral Daily   doxycycline   100 mg Oral Q12H   empagliflozin   10 mg Oral Daily   furosemide   40 mg Intravenous BID   metoprolol  succinate  50 mg Oral Daily   pantoprazole   40 mg Oral BID   sacubitril -valsartan   1 tablet Oral BID   sodium chloride  flush  3 mL Intravenous Q12H    Continuous Infusions:   PRN Medications:  acetaminophen  **OR** acetaminophen , senna-docusate, traMADol   Antimicrobials from admission:  Anti-infectives (From admission, onward)    Start     Dose/Rate Route Frequency Ordered Stop   05/02/24 0215  doxycycline  (VIBRA -TABS) tablet 100 mg        100 mg Oral Every 12 hours 05/02/24 0214 05/06/24 2159   05/01/24 1600  doxycycline  (VIBRAMYCIN ) 100 mg in sodium chloride  0.9 % 250 mL IVPB        100 mg 125 mL/hr over 120 Minutes Intravenous  Once 05/01/24 1549 05/01/24 1834           Data Reviewed:  I have personally reviewed the following...  CBC: Recent Labs  Lab 05/01/24 1257 05/02/24 0549  WBC 4.1 2.6*  HGB 11.4* 9.7*  HCT 34.9* 30.0*  MCV 87.5 86.7  PLT 179 166   Basic Metabolic Panel: Recent Labs  Lab 05/01/24 1257 05/02/24 0549  NA 136 136  K 4.3 4.0  CL 103 102  CO2 21* 22  GLUCOSE 101* 157*  BUN 8 12  CREATININE 0.77 0.77  CALCIUM  8.8* 8.9  MG 2.0  --    GFR: Estimated Creatinine Clearance: 113.3 mL/min (by C-G formula based on SCr of 0.77 mg/dL). Liver Function Tests: No results for input(s): AST, ALT, ALKPHOS, BILITOT, PROT, ALBUMIN in the last 168 hours. No results for input(s): LIPASE, AMYLASE in the last 168 hours. No results for input(s):  AMMONIA in the last 168 hours. Coagulation Profile: No results for input(s): INR, PROTIME in the last 168 hours. Cardiac Enzymes: No results for input(s): CKTOTAL, CKMB, CKMBINDEX, TROPONINI in the last 168 hours. BNP (last 3 results) Recent Labs    05/01/24 1257  PROBNP 5,875.0*   HbA1C: No results for input(s): HGBA1C in the last 72 hours. CBG: Recent Labs  Lab 05/02/24 0725 05/02/24 1751  GLUCAP  138* 155*   Lipid Profile: No results for input(s): CHOL, HDL, LDLCALC, TRIG, CHOLHDL, LDLDIRECT in the last 72 hours. Thyroid  Function Tests: No results for input(s): TSH, T4TOTAL, FREET4, T3FREE, THYROIDAB in the last 72 hours. Anemia Panel: No results for input(s): VITAMINB12, FOLATE, FERRITIN, TIBC, IRON, RETICCTPCT in the last 72 hours. Most Recent Urinalysis On File:     Component Value Date/Time   COLORURINE YELLOW (A) 04/13/2024 0025   APPEARANCEUR CLOUDY (A) 04/13/2024 0025   APPEARANCEUR Cloudy (A) 11/09/2023 0928   LABSPEC 1.010 04/13/2024 0025   PHURINE 5.0 04/13/2024 0025   GLUCOSEU >=500 (A) 04/13/2024 0025   HGBUR LARGE (A) 04/13/2024 0025   BILIRUBINUR NEGATIVE 04/13/2024 0025   BILIRUBINUR negative 01/04/2024 1545   BILIRUBINUR Negative 11/09/2023 0928   KETONESUR NEGATIVE 04/13/2024 0025   PROTEINUR NEGATIVE 04/13/2024 0025   UROBILINOGEN 0.2 01/04/2024 1545   NITRITE NEGATIVE 04/13/2024 0025   LEUKOCYTESUR MODERATE (A) 04/13/2024 0025   Sepsis Labs: @LABRCNTIP (procalcitonin:4,lacticidven:4) Microbiology: Recent Results (from the past 240 hours)  Resp panel by RT-PCR (RSV, Flu A&B, Covid) Anterior Nasal Swab     Status: None   Collection Time: 05/01/24  3:56 PM   Specimen: Anterior Nasal Swab  Result Value Ref Range Status   SARS Coronavirus 2 by RT PCR NEGATIVE NEGATIVE Final    Comment: (NOTE) SARS-CoV-2 target nucleic acids are NOT DETECTED.  The SARS-CoV-2 RNA is generally detectable in upper  respiratory specimens during the acute phase of infection. The lowest concentration of SARS-CoV-2 viral copies this assay can detect is 138 copies/mL. A negative result does not preclude SARS-Cov-2 infection and should not be used as the sole basis for treatment or other patient management decisions. A negative result may occur with  improper specimen collection/handling, submission of specimen other than nasopharyngeal swab, presence of viral mutation(s) within the areas targeted by this assay, and inadequate number of viral copies(<138 copies/mL). A negative result must be combined with clinical observations, patient history, and epidemiological information. The expected result is Negative.  Fact Sheet for Patients:  bloggercourse.com  Fact Sheet for Healthcare Providers:  seriousbroker.it  This test is no t yet approved or cleared by the [place]  FDA and  has been authorized for detection and/or diagnosis of SARS-CoV-2 by FDA under an Emergency Use Authorization (EUA). This EUA will remain  in effect (meaning this test can be used) for the duration of the COVID-19 declaration under Section 564(b)(1) of the Act, 21 U.S.C.section 360bbb-3(b)(1), unless the authorization is terminated  or revoked sooner.       Influenza A by PCR NEGATIVE NEGATIVE Final   Influenza B by PCR NEGATIVE NEGATIVE Final    Comment: (NOTE) The Xpert Xpress SARS-CoV-2/FLU/RSV plus assay is intended as an aid in the diagnosis of influenza from Nasopharyngeal swab specimens and should not be used as a sole basis for treatment. Nasal washings and aspirates are unacceptable for Xpert Xpress SARS-CoV-2/FLU/RSV testing.  Fact Sheet for Patients: bloggercourse.com  Fact Sheet for Healthcare Providers: seriousbroker.it  This test is not yet approved or cleared by the [place]  FDA and has been  authorized for detection and/or diagnosis of SARS-CoV-2 by FDA under an Emergency Use Authorization (EUA). This EUA will remain in effect (meaning this test can be used) for the duration of the COVID-19 declaration under Section 564(b)(1) of the Act, 21 U.S.C. section 360bbb-3(b)(1), unless the authorization is terminated or revoked.     Resp Syncytial Virus by PCR NEGATIVE NEGATIVE Final  Comment: (NOTE) Fact Sheet for Patients: bloggercourse.com  Fact Sheet for Healthcare Providers: seriousbroker.it  This test is not yet approved or cleared by the [place]  FDA and has been authorized for detection and/or diagnosis of SARS-CoV-2 by FDA under an Emergency Use Authorization (EUA). This EUA will remain in effect (meaning this test can be used) for the duration of the COVID-19 declaration under Section 564(b)(1) of the Act, 21 U.S.C. section 360bbb-3(b)(1), unless the authorization is terminated or revoked.  Performed at [facility], [address] [place], [place] [address]       Radiology Studies last 3 days: DG Chest 2 View Result Date: 05/01/2024 CLINICAL DATA:  shob EXAM: DG CHEST 2V COMPARISON:  Nov 03, 2022 FINDINGS: No focal airspace consolidation, pleural effusion, or pneumothorax. Moderate cardiomegaly. Left chest AICD with a single lead terminating in the right ventricle. No acute fracture or destructive lesions. Multilevel thoracic osteophytosis. IMPRESSION: Moderate cardiomegaly.  No pneumonia or pulmonary edema. Electronically Signed   By: Rogelia Myers M.D.   On: 05/01/2024 15:09          Daquisha Clermont, DO [facility] 05/02/2024, 6:00 PM    Dictation software may have been used to generate the above note. Typos may occur and escape review in typed/dictated notes. Please contact Dr Marsa directly for clarity if needed.  Staff may message me via secure chat in Epic  but  this may not receive an immediate response,  please page me for urgent matters!  If 7PM-7AM, please contact night coverage [email or web address]

## 2024-05-03 DIAGNOSIS — I4891 Unspecified atrial fibrillation: Secondary | ICD-10-CM | POA: Diagnosis not present

## 2024-05-03 LAB — GLUCOSE, CAPILLARY
Glucose-Capillary: 93 mg/dL (ref 70–99)
Glucose-Capillary: 99 mg/dL (ref 70–99)

## 2024-05-03 LAB — BASIC METABOLIC PANEL WITH GFR
Anion gap: 11 (ref 5–15)
BUN: 16 mg/dL (ref 8–23)
CO2: 26 mmol/L (ref 22–32)
Calcium: 8.8 mg/dL — ABNORMAL LOW (ref 8.9–10.3)
Chloride: 102 mmol/L (ref 98–111)
Creatinine, Ser: 0.87 mg/dL (ref 0.61–1.24)
GFR, Estimated: >60 mL/min (ref >60)
Glucose, Bld: 124 mg/dL — ABNORMAL HIGH (ref 70–99)
Potassium: 3.3 mmol/L — ABNORMAL LOW (ref 3.5–5.1)
Sodium: 139 mmol/L (ref 135–145)

## 2024-05-03 MED ORDER — ALUM & MAG HYDROXIDE-SIMETH 200-200-20 MG/5ML PO SUSP
30.0000 mL | ORAL | Status: DC | PRN
  Administered 2024-05-03: 30 mL via ORAL
  Filled 2024-05-03: qty 30

## 2024-05-03 MED ORDER — POTASSIUM CHLORIDE 20 MEQ PO PACK
40.0000 meq | PACK | Freq: Two times a day (BID) | ORAL | Status: AC
  Administered 2024-05-03 (×2): 40 meq via ORAL
  Filled 2024-05-03 (×2): qty 2

## 2024-05-03 MED ORDER — FUROSEMIDE 10 MG/ML IJ SOLN
40.0000 mg | Freq: Two times a day (BID) | INTRAMUSCULAR | Status: AC
  Administered 2024-05-03 (×2): 40 mg via INTRAVENOUS
  Filled 2024-05-03 (×2): qty 4

## 2024-05-03 NOTE — Progress Notes (Signed)
   05/03/24 1546  Assess: MEWS Score  BP 101/66  MAP (mmHg) 79  Pulse Rate (!) 111  SpO2 98 %  O2 Device Room Air  Assess: MEWS Score  MEWS Temp 0  MEWS Systolic 0  MEWS Pulse 2  MEWS RR 0  MEWS LOC 0  MEWS Score 2  MEWS Score Color Yellow  Assess: if the MEWS score is Yellow or Red  Were vital signs accurate and taken at a resting state? Yes  Does the patient meet 2 or more of the SIRS criteria? No  MEWS guidelines implemented  Yes, yellow  Treat  MEWS Interventions Considered administering scheduled or prn medications/treatments as ordered  Take Vital Signs  Increase Vital Sign Frequency  Yellow: Q2hr x1, continue Q4hrs until patient remains green for 12hrs  Escalate  MEWS: Escalate Yellow: Discuss with charge nurse and consider notifying provider and/or RRT  Notify: Charge Nurse/RN  Name of Charge Nurse/RN Notified Camie, RN  Provider Notification  Provider Name/Title Marsa DO  Date Provider Notified 05/03/24  Time Provider Notified 1547  Method of Notification Page  Notification Reason Other (Comment) (Yellow MEWS)  Provider response No new orders  Date of Provider Response 05/03/24  Time of Provider Response 1548  Assess: SIRS CRITERIA  SIRS Temperature  0  SIRS Respirations  0  SIRS Pulse 1  SIRS WBC 0  SIRS Score Sum  1

## 2024-05-03 NOTE — Plan of Care (Signed)

## 2024-05-03 NOTE — Evaluation (Signed)
 Physical Therapy Evaluation Patient Details Name: Eugene Henderson MRN: [id number] DOB: 05/29/1951 Today's Date: 05/03/2024  History of Present Illness  presented to ER secondary to progressive SOB; admitted for management of afib with RVR, likely secondary to COPD exacerbation  Clinical Impression  Patient seated in recliner upon arrival to room; alert and oriented to basic information, follows commands and agreeable to participation with session with min encouragement.  Endorses chronic pain to L hip (FACES 4/10), unchanged with this admission.  Bilat UE/LE strength and ROM grossly symmetrical and WFL for basic transfers and mobility; no focal weakness appreciated.  Able to complete bed mobility with mod indep; sit/stand, basic transfers and gait (30' x1, 20' x1) with SPC (personal cane), cga/min assist. Demonstrates broad BOS with slightly choppy stepping pattern; mild forward trunk flexion, intermittently reaching for walls/furniture for external stabilization (SPC in R hand). Mod SOB with exertion; HR peak to 159 with longer-distance gait (maintains 130s with toileting activities), quickly recovering to low-100s with seated rest.  RN informed/aware.   Would benefit from skilled PT to address above deficits and promote optimal return to PLOF.; recommend post-acute PT follow up as indicated by interdisciplinary care team.          If plan is discharge home, recommend the following: A little help with walking and/or transfers;A little help with bathing/dressing/bathroom   Can travel by private vehicle        Equipment Recommendations None recommended by PT  Recommendations for Other Services       Functional Status Assessment Patient has had a recent decline in their functional status and demonstrates the ability to make significant improvements in function in a reasonable and predictable amount of time.     Precautions / Restrictions Precautions Precautions: Fall Restrictions Weight  Bearing Restrictions Per Provider Order: No      Mobility  Bed Mobility Overal bed mobility: Modified Independent Bed Mobility: Sit to Supine       Sit to supine: Modified independent (Device/Increase time)        Transfers Overall transfer level: Needs assistance Equipment used: Straight cane Transfers: Sit to/from Stand Sit to Stand: Contact guard assist                Ambulation/Gait Ambulation/Gait assistance: Contact guard assist Gait Distance (Feet):  (30' x1, 20' x1) Assistive device: Straight cane         General Gait Details: broad BOS with slightly choppy stepping pattern; mild forward trunk flexion, intermittently reaching for walls/furniture for external stabilization (SPC in R hand).  Mod SOB with exertion; HR peak to 159 with longer-distance gait, quickly recovering to low-100s with seated rest  Stairs            Wheelchair Mobility     Tilt Bed    Modified Rankin (Stroke Patients Only)       Balance Overall balance assessment: Needs assistance Sitting-balance support: No upper extremity supported, Feet supported Sitting balance-Leahy Scale: Good     Standing balance support: Single extremity supported Standing balance-Leahy Scale: Fair                               Pertinent Vitals/Pain Pain Assessment Pain Assessment: Faces Faces Pain Scale: Hurts little more Pain Location: L hip Pain Descriptors / Indicators: Aching Pain Intervention(s): Limited activity within patient's tolerance, Monitored during session, Repositioned    Home Living Family/patient expects to be discharged to:: Private residence Living Arrangements:  Children Available Help at Discharge: Family;Available PRN/intermittently Type of Home: House Home Access: Stairs to enter Entrance Stairs-Rails: Right;Left;Can reach both Entrance Stairs-Number of Steps: 4   Home Layout: One level Home Equipment: Cane - single point      Prior Function  Prior Level of Function : Needs assist             Mobility Comments: Pt reports sedentary lifestyle, would sit in recliner daily and use urinal; limited household ambulation for toileting purposes.  Denies fall history. No home O2. ADLs Comments: Daughter would help with shower transfer; he reports being mostly modified independent for dressing; daughter did all cooking/cleaning; not driving;     Extremity/Trunk Assessment   Upper Extremity Assessment Upper Extremity Assessment: Overall WFL for tasks assessed    Lower Extremity Assessment Lower Extremity Assessment: Generalized weakness (grossly 4-/5 throughout)       Communication   Communication Communication: No apparent difficulties    Cognition Arousal: Alert     PT - Cognitive impairments: No apparent impairments                         Following commands: Intact       Cueing       General Comments      Exercises Other Exercises Other Exercises: Toilet transfer, ambulatory with SPC, cga/min assist; sit/stand from standard height toilet with grab bar, cga/close sup. Other Exercises: Oral care and light grooming, seated at sink, set up/sup.   Assessment/Plan    PT Assessment Patient needs continued PT services  PT Problem List Decreased strength;Pain;Decreased activity tolerance;Decreased balance;Decreased mobility       PT Treatment Interventions Balance training;Gait training;Neuromuscular re-education;Stair training;Functional mobility training;Patient/family education;Therapeutic activities;Therapeutic exercise    PT Goals (Current goals can be found in the Care Plan section)  Acute Rehab PT Goals Patient Stated Goal: to go to the bathroom PT Goal Formulation: With patient Time For Goal Achievement: 05/17/24 Potential to Achieve Goals: Good    Frequency Min 2X/week     Co-evaluation               AM-PAC PT 6 Clicks Mobility  Outcome Measure Help needed turning from your  back to your side while in a flat bed without using bedrails?: None Help needed moving from lying on your back to sitting on the side of a flat bed without using bedrails?: None Help needed moving to and from a bed to a chair (including a wheelchair)?: A Little Help needed standing up from a chair using your arms (e.g., wheelchair or bedside chair)?: A Little Help needed to walk in hospital room?: A Little Help needed climbing 3-5 steps with a railing? : A Lot 6 Click Score: 19    End of Session   Activity Tolerance: Patient tolerated treatment well Patient left: in bed;with call bell/phone within reach;with bed alarm set Nurse Communication: Mobility status PT Visit Diagnosis: Unsteadiness on feet (R26.81);Muscle weakness (generalized) (M62.81)    Time: 8551-8480 PT Time Calculation (min) (ACUTE ONLY): 31 min   Charges:   PT Evaluation $PT Eval Moderate Complexity: 1 Mod   PT General Charges $$ ACUTE PT VISIT: 1 Visit        Abdulhadi Stopa H. Delores, PT, DPT, NCS 05/03/24, 4:26 PM [phone]

## 2024-05-03 NOTE — Progress Notes (Signed)
 PROGRESS NOTE    Eugene Henderson   FMW:[id number] DOB: 1951/05/18  DOA: 05/01/2024 Date of Service: 05/03/24 which is hospital day 2  PCP: Eugene Henderson, [facility] course / significant events:   Eugene Henderson is a 73 y.o. year old male with medical history of HTN, HLD, T2DM, HFrEF (recent echo with EF 40 %), COPD presenting with worsening shortness of breath.   HPI: Pt states he woke up Tuesday 05/01/24 4am w/ worsening shortness of breath. Inhalers were not helping. He used them multiple times. Called EMS. No fevers or chills but has been coughing with sputum production, some shortness of breath over the last week. He always has dyspnea on exertion limiting him to 20-30 feet but it has worsened significantly over the last few days.    11/25: to ED, Afib RVR, EDP gave him IV Dilt push and restarted his metoprolol  with improvement in his rate.admitted to hospitalist for Afib RVR likely d/t COPD exacerbation (tx steroids, doxycycline , nebs, NOT needing O2), possible CHF exacerbation (IV lasix  and diuresing well w/ this). 11/26: continuing diuresis, out total almost 3L UOP today 11/27: out another 2L UOP yesterday      Consultants:  none  Procedures/Surgeries: none      ASSESSMENT & PLAN:   Atrial fibrillation with RVR D/t missed his metoprolol  also worsened by him repeatedly using his inhaler for his shortness of breath  EDP gave him IV Dilt push and restarted his metoprolol  with improvement in his rate.  Restarted home Eliquis , digoxin , metoprolol  Telemetry    COPD exacerbation:  prednisone   doxycycline .   nebulizers.   O2 as needed   CHF exacerbation:  Echo done 04/26/24 He is not sure of his medications  Lasix  held on last discharge d/t low BP  IV diuretics for now  Strict I&O GDMT as BP allows, see med rec - metoprolol , entresto , statin, eliquis     Tobacco use disorder Counseled on cessation   Type 2 diabetes Mellitus well controlled: Monitor  CBGs    Hyperlipidemia:  home statin       Class 2 obesity based on BMI: Body mass index is 37.97 kg/m.SABRA Significantly low or high BMI is associated with higher medical risk.  Underweight - under 18  overweight - 25 to 29 obese - 30 or more Class 1 obesity: BMI of 30.0 to 34 Class 2 obesity: BMI of 35.0 to 39 Class 3 obesity: BMI of 40.0 to 49 Super Morbid Obesity: BMI 50-59 Super-super Morbid Obesity: BMI 60+ Healthy nutrition and physical activity advised as adjunct to other disease management and risk reduction treatments    DVT prophylaxis: eliquis  IV fluids: no continuous IV fluids  Nutrition: cardiac / carb Central lines / other devices: none  Code Status: FULL CODE ACP documentation reviewed:  none on file in [facility]  TOC needs: TBD Medical barriers to dispo: pending further diuresis. Expected medical readiness for discharge few days.              Subjective / Brief ROS:  Patient reports still some SOB but overall much better, now spradic instead of constant.  Has been resistant to getting OOB Denies CP Pain controlled.  Denies new weakness.  Tolerating diet.  Reports no concerns w/ urination/defecation.   Family Communication: none at this time     Objective Findings:  Vitals:   05/02/24 2352 05/03/24 0508 05/03/24 0544 05/03/24 0820  BP: 95/74 104/81  111/82  Pulse: 84   96  Resp:  20 20    Temp: 97.8 F (36.6 C) (!) 97.5 F (36.4 C)  98.3 F (36.8 C)  TempSrc: Oral   Oral  SpO2: 100% 94%  95%  Weight:   120.6 kg   Height:        Intake/Output Summary (Last 24 hours) at 05/03/2024 0857 Last data filed at 05/02/2024 2134 Gross per 24 hour  Intake 123 ml  Output 1785 ml  Net -1662 ml   Filed Weights   05/01/24 1253 05/03/24 0544  Weight: 127 kg 120.6 kg    Examination:  Physical Exam Constitutional:      General: He is not in acute distress. Cardiovascular:     Rate and Rhythm: Normal rate. Rhythm irregular.  Pulmonary:      Breath sounds: Examination of the right-lower field reveals rales. Examination of the left-lower field reveals rales. Rales (fainter) present. No wheezing.  Musculoskeletal:     Right lower leg: Edema present.     Left lower leg: Edema present.  Skin:    General: Skin is warm and dry.  Neurological:     Mental Status: He is alert and oriented to person, place, and time.  Psychiatric:        Behavior: Behavior normal.          Scheduled Medications:   apixaban   5 mg Oral BID   atorvastatin   40 mg Oral Daily   digoxin   62.5 mcg Oral Daily   doxycycline   100 mg Oral Q12H   empagliflozin   10 mg Oral Daily   furosemide   40 mg Intravenous BID   metoprolol  succinate  50 mg Oral Daily   pantoprazole   40 mg Oral BID   potassium chloride   40 mEq Oral BID   sacubitril -valsartan   1 tablet Oral BID   sodium chloride  flush  3 mL Intravenous Q12H    Continuous Infusions:   PRN Medications:  acetaminophen  **OR** acetaminophen , senna-docusate, traMADol   Antimicrobials from admission:  Anti-infectives (From admission, onward)    Start     Dose/Rate Route Frequency Ordered Stop   05/02/24 0215  doxycycline  (VIBRA -TABS) tablet 100 mg        100 mg Oral Every 12 hours 05/02/24 0214 05/06/24 2159   05/01/24 1600  doxycycline  (VIBRAMYCIN ) 100 mg in sodium chloride  0.9 % 250 mL IVPB        100 mg 125 mL/hr over 120 Minutes Intravenous  Once 05/01/24 1549 05/01/24 1834           Data Reviewed:  I have personally reviewed the following...  CBC: Recent Labs  Lab 05/01/24 1257 05/02/24 0549  WBC 4.1 2.6*  HGB 11.4* 9.7*  HCT 34.9* 30.0*  MCV 87.5 86.7  PLT 179 166   Basic Metabolic Panel: Recent Labs  Lab 05/01/24 1257 05/02/24 0549 05/03/24 0340  NA 136 136 139  K 4.3 4.0 3.3*  CL 103 102 102  CO2 21* 22 26  GLUCOSE 101* 157* 124*  BUN 8 12 16   CREATININE 0.77 0.77 0.87  CALCIUM  8.8* 8.9 8.8*  MG 2.0  --   --    GFR: Estimated Creatinine Clearance:  101.4 mL/min (by C-G formula based on SCr of 0.87 mg/dL). Liver Function Tests: No results for input(s): AST, ALT, ALKPHOS, BILITOT, PROT, ALBUMIN in the last 168 hours. No results for input(s): LIPASE, AMYLASE in the last 168 hours. No results for input(s): AMMONIA in the last 168 hours. Coagulation Profile: No results for input(s): INR, PROTIME in the  last 168 hours. Cardiac Enzymes: No results for input(s): CKTOTAL, CKMB, CKMBINDEX, TROPONINI in the last 168 hours. BNP (last 3 results) Recent Labs    05/01/24 1257  PROBNP 5,875.0*   HbA1C: Recent Labs    05/02/24 0549  HGBA1C 6.2*   CBG: Recent Labs  Lab 05/02/24 0725 05/02/24 1751 05/03/24 0820  GLUCAP 138* 155* 93   Lipid Profile: No results for input(s): CHOL, HDL, LDLCALC, TRIG, CHOLHDL, LDLDIRECT in the last 72 hours. Thyroid  Function Tests: No results for input(s): TSH, T4TOTAL, FREET4, T3FREE, THYROIDAB in the last 72 hours. Anemia Panel: No results for input(s): VITAMINB12, FOLATE, FERRITIN, TIBC, IRON, RETICCTPCT in the last 72 hours. Most Recent Urinalysis On File:     Component Value Date/Time   COLORURINE YELLOW (A) 04/13/2024 0025   APPEARANCEUR CLOUDY (A) 04/13/2024 0025   APPEARANCEUR Cloudy (A) 11/09/2023 0928   LABSPEC 1.010 04/13/2024 0025   PHURINE 5.0 04/13/2024 0025   GLUCOSEU >=500 (A) 04/13/2024 0025   HGBUR LARGE (A) 04/13/2024 0025   BILIRUBINUR NEGATIVE 04/13/2024 0025   BILIRUBINUR negative 01/04/2024 1545   BILIRUBINUR Negative 11/09/2023 0928   KETONESUR NEGATIVE 04/13/2024 0025   PROTEINUR NEGATIVE 04/13/2024 0025   UROBILINOGEN 0.2 01/04/2024 1545   NITRITE NEGATIVE 04/13/2024 0025   LEUKOCYTESUR MODERATE (A) 04/13/2024 0025   Sepsis Labs: @LABRCNTIP (procalcitonin:4,lacticidven:4) Microbiology: Recent Results (from the past 240 hours)  Resp panel by RT-PCR (RSV, Flu A&B, Covid) Anterior Nasal Swab     Status:  None   Collection Time: 05/01/24  3:56 PM   Specimen: Anterior Nasal Swab  Result Value Ref Range Status   SARS Coronavirus 2 by RT PCR NEGATIVE NEGATIVE Final    Comment: (NOTE) SARS-CoV-2 target nucleic acids are NOT DETECTED.  The SARS-CoV-2 RNA is generally detectable in upper respiratory specimens during the acute phase of infection. The lowest concentration of SARS-CoV-2 viral copies this assay can detect is 138 copies/mL. A negative result does not preclude SARS-Cov-2 infection and should not be used as the sole basis for treatment or other patient management decisions. A negative result may occur with  improper specimen collection/handling, submission of specimen other than nasopharyngeal swab, presence of viral mutation(s) within the areas targeted by this assay, and inadequate number of viral copies(<138 copies/mL). A negative result must be combined with clinical observations, patient history, and epidemiological information. The expected result is Negative.  Fact Sheet for Patients:  bloggercourse.com  Fact Sheet for Healthcare Providers:  seriousbroker.it  This test is no t yet approved or cleared by the [place]  FDA and  has been authorized for detection and/or diagnosis of SARS-CoV-2 by FDA under an Emergency Use Authorization (EUA). This EUA will remain  in effect (meaning this test can be used) for the duration of the COVID-19 declaration under Section 564(b)(1) of the Act, 21 U.S.C.section 360bbb-3(b)(1), unless the authorization is terminated  or revoked sooner.       Influenza A by PCR NEGATIVE NEGATIVE Final   Influenza B by PCR NEGATIVE NEGATIVE Final    Comment: (NOTE) The Xpert Xpress SARS-CoV-2/FLU/RSV plus assay is intended as an aid in the diagnosis of influenza from Nasopharyngeal swab specimens and should not be used as a sole basis for treatment. Nasal washings and aspirates are unacceptable  for Xpert Xpress SARS-CoV-2/FLU/RSV testing.  Fact Sheet for Patients: bloggercourse.com  Fact Sheet for Healthcare Providers: seriousbroker.it  This test is not yet approved or cleared by the [place]  FDA and has been authorized for detection and/or diagnosis  of SARS-CoV-2 by FDA under an Emergency Use Authorization (EUA). This EUA will remain in effect (meaning this test can be used) for the duration of the COVID-19 declaration under Section 564(b)(1) of the Act, 21 U.S.C. section 360bbb-3(b)(1), unless the authorization is terminated or revoked.     Resp Syncytial Virus by PCR NEGATIVE NEGATIVE Final    Comment: (NOTE) Fact Sheet for Patients: bloggercourse.com  Fact Sheet for Healthcare Providers: seriousbroker.it  This test is not yet approved or cleared by the [place]  FDA and has been authorized for detection and/or diagnosis of SARS-CoV-2 by FDA under an Emergency Use Authorization (EUA). This EUA will remain in effect (meaning this test can be used) for the duration of the COVID-19 declaration under Section 564(b)(1) of the Act, 21 U.S.C. section 360bbb-3(b)(1), unless the authorization is terminated or revoked.  Performed at [facility], [address] [place], [place] [address]       Radiology Studies last 3 days: DG Chest 2 View Result Date: 05/01/2024 CLINICAL DATA:  shob EXAM: DG CHEST 2V COMPARISON:  Nov 03, 2022 FINDINGS: No focal airspace consolidation, pleural effusion, or pneumothorax. Moderate cardiomegaly. Left chest AICD with a single lead terminating in the right ventricle. No acute fracture or destructive lesions. Multilevel thoracic osteophytosis. IMPRESSION: Moderate cardiomegaly.  No pneumonia or pulmonary edema. Electronically Signed   By: Rogelia Myers M.D.   On: 05/01/2024 15:09          Laneta Blunt, DO [facility] 05/03/2024, 8:57 AM    Dictation software may have been used to generate the above note. Typos may occur and escape review in typed/dictated notes. Please contact Dr Blunt directly for clarity if needed.  Staff may message me via secure chat in Epic  but this may not receive an immediate response,  please page me for urgent matters!  If 7PM-7AM, please contact night coverage [email or web address]

## 2024-05-04 ENCOUNTER — Inpatient Hospital Stay

## 2024-05-04 DIAGNOSIS — I4891 Unspecified atrial fibrillation: Secondary | ICD-10-CM | POA: Diagnosis not present

## 2024-05-04 LAB — BASIC METABOLIC PANEL WITH GFR
Anion gap: 11 (ref 5–15)
BUN: 15 mg/dL (ref 8–23)
CO2: 26 mmol/L (ref 22–32)
Calcium: 8.4 mg/dL — ABNORMAL LOW (ref 8.9–10.3)
Chloride: 98 mmol/L (ref 98–111)
Creatinine, Ser: 0.77 mg/dL (ref 0.61–1.24)
GFR, Estimated: >60 mL/min (ref >60)
Glucose, Bld: 94 mg/dL (ref 70–99)
Potassium: 4 mmol/L (ref 3.5–5.1)
Sodium: 135 mmol/L (ref 135–145)

## 2024-05-04 LAB — GLUCOSE, CAPILLARY
Glucose-Capillary: 117 mg/dL — ABNORMAL HIGH (ref 70–99)
Glucose-Capillary: 116 mg/dL — ABNORMAL HIGH (ref 70–99)

## 2024-05-04 MED ORDER — MORPHINE SULFATE (PF) 2 MG/ML IV SOLN
1.0000 mg | Freq: Once | INTRAVENOUS | Status: AC
  Administered 2024-05-04: 1 mg via INTRAVENOUS
  Filled 2024-05-04: qty 1

## 2024-05-04 MED ORDER — IPRATROPIUM-ALBUTEROL 0.5-2.5 (3) MG/3ML IN SOLN
3.0000 mL | Freq: Four times a day (QID) | RESPIRATORY_TRACT | Status: DC
  Administered 2024-05-04 – 2024-05-05 (×3): 3 mL via RESPIRATORY_TRACT
  Filled 2024-05-04 (×3): qty 3

## 2024-05-04 MED ORDER — FUROSEMIDE 40 MG PO TABS
40.0000 mg | ORAL_TABLET | Freq: Two times a day (BID) | ORAL | Status: DC
  Administered 2024-05-05: 40 mg via ORAL
  Filled 2024-05-04: qty 1

## 2024-05-04 MED ORDER — IPRATROPIUM-ALBUTEROL 0.5-2.5 (3) MG/3ML IN SOLN
3.0000 mL | Freq: Four times a day (QID) | RESPIRATORY_TRACT | Status: DC | PRN
  Administered 2024-05-04: 3 mL via RESPIRATORY_TRACT
  Filled 2024-05-04: qty 3

## 2024-05-04 MED ORDER — IOHEXOL 350 MG/ML SOLN
75.0000 mL | Freq: Once | INTRAVENOUS | Status: AC | PRN
  Administered 2024-05-04: 75 mL via INTRAVENOUS

## 2024-05-04 MED ORDER — FUROSEMIDE 10 MG/ML IJ SOLN
20.0000 mg | Freq: Once | INTRAMUSCULAR | Status: AC
  Administered 2024-05-04: 20 mg via INTRAVENOUS
  Filled 2024-05-04: qty 2

## 2024-05-04 NOTE — Plan of Care (Signed)
 Pt is alert oriented x 4. RA. Pt had episodes HR increased to 130s afib but decreased to 90- low 100s. Pt called out at 0540 stating he was feeling short of breath. Pt lung sound diminished, HR 108. 0544 On call provider Dr. Lawence messaged to inform. Received order for Lasix  IV 20mg  x 1. Morphine  1mg  IV x 1, and prn nebs ordered. Pt placed on oxygen for comfort. Orders completed per order. Pt stated he feels a little improvement. Call button within reach.    Problem: Education: Goal: Knowledge of General Education information will improve Description: Including pain rating scale, medication(s)/side effects and non-pharmacologic comfort measures Outcome: Progressing   Problem: Health Behavior/Discharge Planning: Goal: Ability to manage health-related needs will improve Outcome: Progressing   Problem: Health Behavior/Discharge Planning: Goal: Ability to manage health-related needs will improve Outcome: Progressing   Problem: Clinical Measurements: Goal: Ability to maintain clinical measurements within normal limits will improve Outcome: Progressing Goal: Will remain free from infection Outcome: Progressing Goal: Diagnostic test results will improve Outcome: Progressing Goal: Respiratory complications will improve Outcome: Progressing Goal: Cardiovascular complication will be avoided Outcome: Progressing   Problem: Nutrition: Goal: Adequate nutrition will be maintained Outcome: Progressing   Problem: Activity: Goal: Risk for activity intolerance will decrease Outcome: Progressing   Problem: Nutrition: Goal: Adequate nutrition will be maintained Outcome: Progressing   Problem: Coping: Goal: Level of anxiety will decrease Outcome: Progressing   Problem: Elimination: Goal: Will not experience complications related to bowel motility Outcome: Progressing Goal: Will not experience complications related to urinary retention Outcome: Progressing   Problem: Pain  Managment: Goal: General experience of comfort will improve and/or be controlled Outcome: Progressing   Problem: Safety: Goal: Ability to remain free from injury will improve Outcome: Progressing   Problem: Skin Integrity: Goal: Risk for impaired skin integrity will decrease Outcome: Progressing

## 2024-05-04 NOTE — Progress Notes (Signed)
 PROGRESS NOTE    Eugene Henderson   FMW:[id number] DOB: 06/21/1950  DOA: 05/01/2024 Date of Service: 05/04/24 which is hospital day 3  PCP: Louder Duwaine SQUIB[facility], [facility] course / significant events:   Eugene Henderson is a 73 y.o. year old male with medical history of HTN, HLD, T2DM, HFrEF (recent echo with EF 40 %), COPD presenting with worsening shortness of breath.   HPI: Pt states he woke up Tuesday 05/01/24 4am w/ worsening shortness of breath. Inhalers were not helping. He used them multiple times. Called EMS. No fevers or chills but has been coughing with sputum production, some shortness of breath over the last week. He always has dyspnea on exertion limiting him to 20-30 feet but it has worsened significantly over the last few days.    11/25: to ED, Afib RVR, EDP gave him IV Dilt push and restarted his metoprolol  with improvement in his rate.admitted to hospitalist for Afib RVR likely d/t COPD exacerbation (tx steroids, doxycycline , nebs, NOT needing O2), possible CHF exacerbation (IV lasix  and diuresing well w/ this). 11/26: continuing diuresis, out total almost 3L UOP today 11/27: out another 2L UOP yesterday , continuing diuresis 11/28: UOP slowing down some, still very SOB w/ walking. CTA chest no effusion/edema, no PE.      Consultants:  none  Procedures/Surgeries: none      ASSESSMENT & PLAN:   Atrial fibrillation with RVR D/t missed his metoprolol  also worsened by him repeatedly using his inhaler for his shortness of breath  EDP gave him IV Dilt push and restarted his metoprolol  with improvement in his rate.  Restarted home Eliquis , digoxin , metoprolol  Telemetry    COPD exacerbation:  prednisone   doxycycline .   Nebulizers scheduled O2 as needed - amb walk test w/ O2 requested, may need home O2  Incentive spirometer encouraged    CHF exacerbation:  Echo done 04/26/24 He is not sure of his medications  Lasix  held on last discharge d/t low BP,  probably he needed that rx   IV diuretics --> po  Strict I&O GDMT as BP allows, see med rec - metoprolol , entresto , statin, eliquis     Tobacco use disorder Counseled on cessation   Type 2 diabetes Mellitus well controlled: Monitor CBGs    Hyperlipidemia:  home statin   Deconditioning PT/OT --> home health    Class 2 obesity based on BMI: Body mass index is 37.97 kg/m.SABRA Significantly low or high BMI is associated with higher medical risk.  Underweight - under 18  overweight - 25 to 29 obese - 30 or more Class 1 obesity: BMI of 30.0 to 34 Class 2 obesity: BMI of 35.0 to 39 Class 3 obesity: BMI of 40.0 to 49 Super Morbid Obesity: BMI 50-59 Super-super Morbid Obesity: BMI 60+ Healthy nutrition and physical activity advised as adjunct to other disease management and risk reduction treatments    DVT prophylaxis: eliquis  IV fluids: no continuous IV fluids  Nutrition: cardiac / carb Central lines / other devices: none  Code Status: FULL CODE ACP documentation reviewed:  none on file in [facility]  TOC needs: TBD Medical barriers to dispo: pending further diuresis. Expected medical readiness for discharge few days.              Subjective / Brief ROS:  Patient reports still some SOB worse w/ ambulation Has been resistant to getting OOB but more amenable today  Denies CP Pain controlled.  Denies new weakness.  Tolerating diet.  Reports no concerns  w/ urination/defecation.   Family Communication: none at this time     Objective Findings:  Vitals:   05/03/24 2349 05/04/24 0403 05/04/24 0539 05/04/24 0949  BP: 119/72 121/80 116/89 108/72  Pulse: 73 93 70 64  Resp: 20 20 16    Temp: (!) 97.5 F (36.4 C) 97.9 F (36.6 C)  97.8 F (36.6 C)  TempSrc:      SpO2: 97% 99% 100% 96%  Weight:   121.3 kg   Height:   6' (1.829 m)     Intake/Output Summary (Last 24 hours) at 05/04/2024 1418 Last data filed at 05/04/2024 1342 Gross per 24 hour  Intake 480 ml   Output 1900 ml  Net -1420 ml   Filed Weights   05/01/24 1253 05/03/24 0544 05/04/24 0539  Weight: 127 kg 120.6 kg 121.3 kg    Examination:  Physical Exam Constitutional:      General: He is not in acute distress. Cardiovascular:     Rate and Rhythm: Normal rate. Rhythm irregular.  Pulmonary:     Breath sounds: Examination of the right-lower field reveals decreased breath sounds. Examination of the left-lower field reveals decreased breath sounds. Decreased breath sounds present. No wheezing or rales (fainter).  Musculoskeletal:     Right lower leg: Edema present.     Left lower leg: Edema present.  Skin:    General: Skin is warm and dry.  Neurological:     Mental Status: He is alert and oriented to person, place, and time.  Psychiatric:        Behavior: Behavior normal.          Scheduled Medications:   apixaban   5 mg Oral BID   atorvastatin   40 mg Oral Daily   digoxin   62.5 mcg Oral Daily   doxycycline   100 mg Oral Q12H   empagliflozin   10 mg Oral Daily   [START ON 05/05/2024] furosemide   40 mg Oral BID   ipratropium-albuterol   3 mL Nebulization Q6H WA   metoprolol  succinate  50 mg Oral Daily   pantoprazole   40 mg Oral BID   sacubitril -valsartan   1 tablet Oral BID   sodium chloride  flush  3 mL Intravenous Q12H    Continuous Infusions:   PRN Medications:  acetaminophen  **OR** acetaminophen , alum & mag hydroxide-simeth, senna-docusate, traMADol   Antimicrobials from admission:  Anti-infectives (From admission, onward)    Start     Dose/Rate Route Frequency Ordered Stop   05/02/24 0215  doxycycline  (VIBRA -TABS) tablet 100 mg        100 mg Oral Every 12 hours 05/02/24 0214 05/06/24 2159   05/01/24 1600  doxycycline  (VIBRAMYCIN ) 100 mg in sodium chloride  0.9 % 250 mL IVPB        100 mg 125 mL/hr over 120 Minutes Intravenous  Once 05/01/24 1549 05/01/24 1834           Data Reviewed:  I have personally reviewed the following...  CBC: Recent Labs   Lab 05/01/24 1257 05/02/24 0549  WBC 4.1 2.6*  HGB 11.4* 9.7*  HCT 34.9* 30.0*  MCV 87.5 86.7  PLT 179 166   Basic Metabolic Panel: Recent Labs  Lab 05/01/24 1257 05/02/24 0549 05/03/24 0340 05/04/24 0431  NA 136 136 139 135  K 4.3 4.0 3.3* 4.0  CL 103 102 102 98  CO2 21* 22 26 26   GLUCOSE 101* 157* 124* 94  BUN 8 12 16 15   CREATININE 0.77 0.77 0.87 0.77  CALCIUM  8.8* 8.9 8.8* 8.4*  MG  2.0  --   --   --    GFR: Estimated Creatinine Clearance: 110.6 mL/min (by C-G formula based on SCr of 0.77 mg/dL). Liver Function Tests: No results for input(s): AST, ALT, ALKPHOS, BILITOT, PROT, ALBUMIN in the last 168 hours. No results for input(s): LIPASE, AMYLASE in the last 168 hours. No results for input(s): AMMONIA in the last 168 hours. Coagulation Profile: No results for input(s): INR, PROTIME in the last 168 hours. Cardiac Enzymes: No results for input(s): CKTOTAL, CKMB, CKMBINDEX, TROPONINI in the last 168 hours. BNP (last 3 results) Recent Labs    05/01/24 1257  PROBNP 5,875.0*   HbA1C: Recent Labs    05/02/24 0549  HGBA1C 6.2*   CBG: Recent Labs  Lab 05/02/24 0725 05/02/24 1751 05/03/24 0820 05/03/24 1123 05/04/24 1110  GLUCAP 138* 155* 93 99 117*   Lipid Profile: No results for input(s): CHOL, HDL, LDLCALC, TRIG, CHOLHDL, LDLDIRECT in the last 72 hours. Thyroid  Function Tests: No results for input(s): TSH, T4TOTAL, FREET4, T3FREE, THYROIDAB in the last 72 hours. Anemia Panel: No results for input(s): VITAMINB12, FOLATE, FERRITIN, TIBC, IRON, RETICCTPCT in the last 72 hours. Most Recent Urinalysis On File:     Component Value Date/Time   COLORURINE YELLOW (A) 04/13/2024 0025   APPEARANCEUR CLOUDY (A) 04/13/2024 0025   APPEARANCEUR Cloudy (A) 11/09/2023 0928   LABSPEC 1.010 04/13/2024 0025   PHURINE 5.0 04/13/2024 0025   GLUCOSEU >=500 (A) 04/13/2024 0025   HGBUR LARGE (A) 04/13/2024  0025   BILIRUBINUR NEGATIVE 04/13/2024 0025   BILIRUBINUR negative 01/04/2024 1545   BILIRUBINUR Negative 11/09/2023 0928   KETONESUR NEGATIVE 04/13/2024 0025   PROTEINUR NEGATIVE 04/13/2024 0025   UROBILINOGEN 0.2 01/04/2024 1545   NITRITE NEGATIVE 04/13/2024 0025   LEUKOCYTESUR MODERATE (A) 04/13/2024 0025   Sepsis Labs: @LABRCNTIP (procalcitonin:4,lacticidven:4) Microbiology: Recent Results (from the past 240 hours)  Resp panel by RT-PCR (RSV, Flu A&B, Covid) Anterior Nasal Swab     Status: None   Collection Time: 05/01/24  3:56 PM   Specimen: Anterior Nasal Swab  Result Value Ref Range Status   SARS Coronavirus 2 by RT PCR NEGATIVE NEGATIVE Final    Comment: (NOTE) SARS-CoV-2 target nucleic acids are NOT DETECTED.  The SARS-CoV-2 RNA is generally detectable in upper respiratory specimens during the acute phase of infection. The lowest concentration of SARS-CoV-2 viral copies this assay can detect is 138 copies/mL. A negative result does not preclude SARS-Cov-2 infection and should not be used as the sole basis for treatment or other patient management decisions. A negative result may occur with  improper specimen collection/handling, submission of specimen other than nasopharyngeal swab, presence of viral mutation(s) within the areas targeted by this assay, and inadequate number of viral copies(<138 copies/mL). A negative result must be combined with clinical observations, patient history, and epidemiological information. The expected result is Negative.  Fact Sheet for Patients:  bloggercourse.com  Fact Sheet for Healthcare Providers:  seriousbroker.it  This test is no t yet approved or cleared by the [place]  FDA and  has been authorized for detection and/or diagnosis of SARS-CoV-2 by FDA under an Emergency Use Authorization (EUA). This EUA will remain  in effect (meaning this test can be used) for the duration of  the COVID-19 declaration under Section 564(b)(1) of the Act, 21 U.S.C.section 360bbb-3(b)(1), unless the authorization is terminated  or revoked sooner.       Influenza A by PCR NEGATIVE NEGATIVE Final   Influenza B by PCR NEGATIVE NEGATIVE Final  Comment: (NOTE) The Xpert Xpress SARS-CoV-2/FLU/RSV plus assay is intended as an aid in the diagnosis of influenza from Nasopharyngeal swab specimens and should not be used as a sole basis for treatment. Nasal washings and aspirates are unacceptable for Xpert Xpress SARS-CoV-2/FLU/RSV testing.  Fact Sheet for Patients: bloggercourse.com  Fact Sheet for Healthcare Providers: seriousbroker.it  This test is not yet approved or cleared by the [place]  FDA and has been authorized for detection and/or diagnosis of SARS-CoV-2 by FDA under an Emergency Use Authorization (EUA). This EUA will remain in effect (meaning this test can be used) for the duration of the COVID-19 declaration under Section 564(b)(1) of the Act, 21 U.S.C. section 360bbb-3(b)(1), unless the authorization is terminated or revoked.     Resp Syncytial Virus by PCR NEGATIVE NEGATIVE Final    Comment: (NOTE) Fact Sheet for Patients: bloggercourse.com  Fact Sheet for Healthcare Providers: seriousbroker.it  This test is not yet approved or cleared by the [place]  FDA and has been authorized for detection and/or diagnosis of SARS-CoV-2 by FDA under an Emergency Use Authorization (EUA). This EUA will remain in effect (meaning this test can be used) for the duration of the COVID-19 declaration under Section 564(b)(1) of the Act, 21 U.S.C. section 360bbb-3(b)(1), unless the authorization is terminated or revoked.  Performed at [facility], [address], [place], [place] [address]       Radiology Studies last 3 days: CT Angio Chest Pulmonary  Embolism (PE) W or WO Contrast Result Date: 05/04/2024 CLINICAL DATA:  Short of breath EXAM: CT ANGIOGRAPHY CHEST WITH CONTRAST TECHNIQUE: Multidetector CT imaging of the chest was performed using the standard protocol during bolus administration of intravenous contrast. Multiplanar CT image reconstructions and MIPs were obtained to evaluate the vascular anatomy. RADIATION DOSE REDUCTION: This exam was performed according to the departmental dose-optimization program which includes automated exposure control, adjustment of the mA and/or kV according to patient size and/or use of iterative reconstruction technique. CONTRAST:  75mL OMNIPAQUE  IOHEXOL  350 MG/ML SOLN COMPARISON:  06/14/2022. Chest radiographs, most recent dated 05/01/2024. FINDINGS: Cardiovascular: Pulmonary arteries are well opacified. There is no evidence of a pulmonary embolism. Heart is mildly enlarged. Three-vessel coronary artery calcifications. No pericardial effusion. Ascending thoracic aorta is dilated to 4.7 cm. Main pulmonary artery is dilated to 4.3 cm. Mediastinum/Nodes: No neck base, mediastinal or hilar masses. Mildly prominent mediastinal and hilar lymph nodes, all subcentimeter and stable from the prior CT. Trachea esophagus are unremarkable. Lungs/Pleura: Bronchial wall thickening most evident in the lower lobes. No lung consolidation. No evidence of edema. Mild centrilobular emphysema. No lung mass or suspicious nodule. No pleural effusion or pneumothorax. Upper Abdomen: No acute findings. Nodular contour of the liver. Aortic atherosclerosis with dilated aorta at its hiatus measuring 4.1 cm. Musculoskeletal: No fracture or acute finding.  No bone lesion. Review of the MIP images confirms the above findings. IMPRESSION: 1. No evidence of a pulmonary embolism. 2. No evidence of pneumonia or pulmonary edema. 3. Dilated aorta, ascending portion measuring 4.7 cm, stable from the prior CT. Ascending thoracic aortic aneurysm. Recommend  semi-annual imaging followup by CTA or MRA and referral to cardiothoracic surgery if not already obtained. This recommendation follows 2010 ACCF/AHA/AATS/ACR/ASA/SCA/SCAI/SIR/STS/SVM Guidelines for the Diagnosis and Management of Patients With Thoracic Aortic Disease. Circulation. 2010; 121: Z733-z630. Aortic aneurysm NOS (ICD10-I71.9) 4. Dilated main pulmonary artery suggesting pulmonary artery hypertension, also stable from the prior CT. 5. Bronchial wall thickening and may be chronic. Consider acute bronchitis in the proper  clinical setting. 6. Nodular liver contour suggesting cirrhosis. Aortic Atherosclerosis (ICD10-I70.0) and Emphysema (ICD10-J43.9). Electronically Signed   By: Alm Parkins M.D.   On: 05/04/2024 13:39   DG Chest 2 View Result Date: 05/01/2024 CLINICAL DATA:  shob EXAM: DG CHEST 2V COMPARISON:  Nov 03, 2022 FINDINGS: No focal airspace consolidation, pleural effusion, or pneumothorax. Moderate cardiomegaly. Left chest AICD with a single lead terminating in the right ventricle. No acute fracture or destructive lesions. Multilevel thoracic osteophytosis. IMPRESSION: Moderate cardiomegaly.  No pneumonia or pulmonary edema. Electronically Signed   By: Rogelia Myers M.D.   On: 05/01/2024 15:09          Sherline Eberwein, DO [facility] 05/04/2024, 2:18 PM    Dictation software may have been used to generate the above note. Typos may occur and escape review in typed/dictated notes. Please contact Dr Marsa directly for clarity if needed.  Staff may message me via secure chat in Epic  but this may not receive an immediate response,  please page me for urgent matters!  If 7PM-7AM, please contact night coverage [email or web address]

## 2024-05-04 NOTE — Progress Notes (Addendum)
 Mobility Specialist - Progress Note    05/04/24 0900  Mobility  Activity Ambulated with assistance;Stood at bedside;Dangled on edge of bed  Level of Assistance Contact guard assist, steadying assist  Assistive Device Cane  Distance Ambulated (ft) 15 ft  Range of Motion/Exercises Active;All extremities  Activity Response Tolerated well  Mobility visit 1 Mobility  Mobility Specialist Start Time (ACUTE ONLY) [id number]  Mobility Specialist Stop Time (ACUTE ONLY) [id number]  Mobility Specialist Time Calculation (min) (ACUTE ONLY) 20 min   Pt was supine in bed On O2 @ 2 L upon entry. Pt agreed to mobility. Pt was able today to get to the EOB independently with bed features. Pt was able today to STS with minA CGA and a 4 point cane. Pt ambulated well. Pt had a BM after activity. After activity pt returned to the bed with needs in reach and bed alarm on.  Clem Rodes Mobility Specialist 05/04/24, 9:46 AM

## 2024-05-04 NOTE — Care Management Important Message (Signed)
 Important Message  Patient Details  Name: Eugene Henderson MRN: [id number] Date of Birth: 1950-12-28   Important Message Given:  Yes - Medicare IM     [name] 05/04/2024, 5:12 PM

## 2024-05-05 DIAGNOSIS — I4821 Permanent atrial fibrillation: Secondary | ICD-10-CM

## 2024-05-05 DIAGNOSIS — I4891 Unspecified atrial fibrillation: Secondary | ICD-10-CM | POA: Diagnosis not present

## 2024-05-05 DIAGNOSIS — I509 Heart failure, unspecified: Secondary | ICD-10-CM

## 2024-05-05 LAB — BASIC METABOLIC PANEL WITH GFR
Anion gap: 11 (ref 5–15)
BUN: 14 mg/dL (ref 8–23)
CO2: 26 mmol/L (ref 22–32)
Calcium: 8.8 mg/dL — ABNORMAL LOW (ref 8.9–10.3)
Chloride: 98 mmol/L (ref 98–111)
Creatinine, Ser: 0.76 mg/dL (ref 0.61–1.24)
GFR, Estimated: >60 mL/min (ref >60)
Glucose, Bld: 107 mg/dL — ABNORMAL HIGH (ref 70–99)
Potassium: 3.7 mmol/L (ref 3.5–5.1)
Sodium: 134 mmol/L — ABNORMAL LOW (ref 135–145)

## 2024-05-05 LAB — GLUCOSE, CAPILLARY: Glucose-Capillary: 114 mg/dL — ABNORMAL HIGH (ref 70–99)

## 2024-05-05 MED ORDER — IPRATROPIUM-ALBUTEROL 0.5-2.5 (3) MG/3ML IN SOLN
3.0000 mL | Freq: Three times a day (TID) | RESPIRATORY_TRACT | Status: DC
  Administered 2024-05-06: 3 mL via RESPIRATORY_TRACT
  Filled 2024-05-05: qty 3

## 2024-05-05 MED ORDER — SPIRONOLACTONE 12.5 MG HALF TABLET
12.5000 mg | ORAL_TABLET | Freq: Every day | ORAL | Status: DC
  Administered 2024-05-05 – 2024-05-06 (×2): 12.5 mg via ORAL
  Filled 2024-05-05 (×2): qty 1

## 2024-05-05 MED ORDER — IPRATROPIUM-ALBUTEROL 0.5-2.5 (3) MG/3ML IN SOLN
3.0000 mL | Freq: Four times a day (QID) | RESPIRATORY_TRACT | Status: DC
  Administered 2024-05-05 (×2): 3 mL via RESPIRATORY_TRACT
  Filled 2024-05-05 (×2): qty 3

## 2024-05-05 MED ORDER — IPRATROPIUM-ALBUTEROL 0.5-2.5 (3) MG/3ML IN SOLN: 3.0000 mL | RESPIRATORY_TRACT | Status: DC | PRN

## 2024-05-05 MED ORDER — IPRATROPIUM-ALBUTEROL 0.5-2.5 (3) MG/3ML IN SOLN: 3.0000 mL | Freq: Four times a day (QID) | RESPIRATORY_TRACT | Status: DC | PRN

## 2024-05-05 MED ORDER — FUROSEMIDE 40 MG PO TABS
40.0000 mg | ORAL_TABLET | Freq: Every day | ORAL | Status: DC
  Administered 2024-05-05 – 2024-05-10 (×6): 40 mg via ORAL
  Filled 2024-05-05 (×6): qty 1

## 2024-05-05 NOTE — Consult Note (Signed)
 Cardiology Consultation   Patient ID: Eugene Henderson MRN: [id number]; DOB: 11-Sep-1950  Admit date: 05/01/2024 Date of Consult: 05/05/2024  PCP:  Louder Duwaine SQUIBB, DO   [facility] Providers Cardiologist:  Deatrice Cage, MD  Electrophysiologist:  OLE ONEIDA HOLTS, MD       Patient Profile: Eugene Henderson is a 73 y.o. male with a hx of permanent A-fib, COPD, HFrEF, ICD who is being seen 05/05/2024 for the evaluation of shortness of breath at the request of Dr. Marsa.  History of Present Illness: Eugene Henderson is a 73 year old male with history of NICM EF 40 to 45%, s/p AICD 5/24, permanent A-fib, COPD presenting with worsening shortness of breath, admitted with COPD exacerbation.  Chest x-ray on admission showed no effusion, EKG showing A-fib heart rate 100s to 120.  Denies chest pain, gets short of breath with ambulation.  According to outpatient meds, Lasix  40 mg daily was paused.  Patient not sure if he is taking.  Kidney function, has been okay.  Complains of left wrist pain, primary team considering x-ray if pain persists.  Left heart cath 12/2023 minimal nonobstructive CAD. Echocardiogram 04/2024 EF 40 to 45%   Past Medical History:  Diagnosis Date   Arthritis    Ascending aortic aneurysm    a. 09/2017 Stable TAA - 5.1cm.   Asthma    Chronic systolic CHF (congestive heart failure) (HCC)    a. EF 25-30% by echo in 07/2016 with cath showing no significant CAD b. 01/2017: EF 30-35% with diffuse HK and moderate MR; c. 07/2017 Echo: EF 30-35%, diff hK. Mild MR, mildly dil LA. PASP .   Diverticulitis    Diverticulitis of large intestine with perforation without abscess or bleeding 05/13/2017   Hypertension    Hyperthyroidism    NICM (nonischemic cardiomyopathy) (HCC)    Noncompliance    Persistent atrial fibrillation (HCC)    a. CHA2DS2VASc = 3-->Eliquis  (? compliance).    Past Surgical History:  Procedure Laterality Date   CARDIOVERSION N/A 06/23/2022    Procedure: CARDIOVERSION;  Surgeon: Gardenia Led, DO;  Location: [facility] ORS;  Service: Cardiovascular;  Laterality: N/A;   CARDIOVERSION N/A 06/25/2022   Procedure: CARDIOVERSION;  Surgeon: Gardenia Led, DO;  Location: [facility];  Service: Cardiovascular;  Laterality: N/A;   CARDIOVERSION N/A 09/29/2022   Procedure: CARDIOVERSION;  Surgeon: Rolan Ezra RAMAN, MD;  Location: [facility] INVASIVE CV LAB;  Service: Cardiovascular;  Laterality: N/A;   COLONOSCOPY N/A 08/27/2019   Procedure: COLONOSCOPY;  Surgeon: Toledo, Ladell POUR, MD;  Location: [facility] ENDOSCOPY;  Service: Gastroenterology;  Laterality: N/A;   ENDOVASCULAR STENT GRAFT (AAA) N/A 06/17/2022   Procedure: ENDOVASCULAR REPAIR/STENT GRAFT;  Surgeon: Marea Selinda RAMAN, MD;  Location: [facility] INVASIVE CV LAB;  Service: Cardiovascular;  Laterality: N/A;   ICD IMPLANT N/A 11/03/2022   Procedure: ICD IMPLANT;  Surgeon: Holts Ole ONEIDA, MD;  Location: [facility] INVASIVE CV LAB;  Service: Cardiovascular;  Laterality: N/A;   JOINT REPLACEMENT Right    RIGHT HEART CATH Right 09/21/2022   Procedure: RIGHT HEART CATH;  Surgeon: Rolan Ezra RAMAN, MD;  Location: [facility] INVASIVE CV LAB;  Service: Cardiovascular;  Laterality: Right;   RIGHT/LEFT HEART CATH AND CORONARY ANGIOGRAPHY N/A 08/02/2016   Procedure: Right/Left Heart Cath and Coronary Angiography;  Surgeon: Deatrice DELENA Cage, MD;  Location: [facility] INVASIVE CV LAB;  Service: Cardiovascular;  Laterality: N/A;   RIGHT/LEFT HEART CATH AND CORONARY ANGIOGRAPHY Bilateral 12/16/2023   Procedure: RIGHT/LEFT HEART CATH AND CORONARY ANGIOGRAPHY;  Surgeon: Cage Deatrice DELENA,  MD;  Location: [facility] INVASIVE CV LAB;  Service: Cardiovascular;  Laterality: Bilateral;   TEE WITHOUT CARDIOVERSION N/A 06/23/2022   Procedure: TRANSESOPHAGEAL ECHOCARDIOGRAM;  Surgeon: Gardenia Led, DO;  Location: [facility] ORS;  Service: Cardiovascular;  Laterality: N/A;   TESTICLE SURGERY     Patient states that he had to have the tube fixed.     Home  Medications:  Prior to Admission medications   Medication Sig Start Date End Date Taking? Authorizing Provider  acetaminophen  (TYLENOL ) 500 MG tablet Take 1-2 tablets (500-1,000 mg total) by mouth every 6 (six) hours as needed for mild pain (pain score 1-3), fever or headache. Patient taking differently: Take 1,000 mg by mouth 2 (two) times daily. 04/12/23  Yes Crutchley, Megan P, DO  albuterol  (VENTOLIN  HFA) 108 (90 Base) MCG/ACT inhaler Inhale 1-2 puffs into the lungs every 6 (six) hours as needed for wheezing or shortness of breath. 11/09/23  Yes Leap, Megan P, DO  atorvastatin  (LIPITOR) 40 MG tablet TAKE 1 TABLET EVERY DAY 05/01/24  Yes Morss, Megan P, DO  digoxin  (LANOXIN ) 0.125 MG tablet TAKE 1/2 TABLET BY MOUTH EVERY DAY 11/07/23  Yes Cindie Ole DASEN, MD  ELIQUIS  5 MG TABS tablet TAKE 1 TABLET TWICE DAILY 04/30/24  Yes Cindie Ole DASEN, MD  empagliflozin  (JARDIANCE ) 10 MG TABS tablet TAKE 1 TABLET EVERY DAY 04/06/24  Yes Fellman, Megan P, DO  folic acid  (FOLVITE ) 1 MG tablet Take 1 tablet (1 mg total) by mouth daily. 11/09/23  Yes Neto, Megan P, DO  furosemide  (LASIX ) 40 MG tablet Take 1 tablet (40 mg total) by mouth daily. 11/03/23  Yes Cindie Ole DASEN, MD  metFORMIN  (GLUCOPHAGE ) 500 MG tablet TAKE 1 TABLET TWICE DAILY WITH MEALS 05/01/24  Yes Elenbaas, Megan P, DO  metoprolol  succinate (TOPROL -XL) 50 MG 24 hr tablet Take 1 tablet (50 mg total) by mouth daily. Reduced from 150 mg. 04/14/24  Yes Awanda City, MD  pantoprazole  (PROTONIX ) 40 MG tablet Take 1 tablet (40 mg total) by mouth 2 (two) times daily. 11/09/23  Yes Juenger, Megan P, DO  potassium chloride  SA (KLOR-CON  M) 20 MEQ tablet TAKE 1 TABLET BY MOUTH EVERY DAY 03/09/24  Yes Lemke, Megan P, DO  sacubitril -valsartan  (ENTRESTO ) 49-51 MG Take 1 tablet by mouth 2 (two) times daily. 12/08/23  Yes Furth, Cadence H, PA-C  Semaglutide  (RYBELSUS ) 7 MG TABS Take 1 tablet (7 mg total) by mouth daily. 03/08/24  Yes Welz, Megan P, DO   spironolactone  (ALDACTONE ) 25 MG tablet Take 1 tablet (25 mg total) by mouth daily. 11/09/23  Yes Quale, Megan P, DO  Tiotropium Bromide-Olodaterol (STIOLTO RESPIMAT ) 2.5-2.5 MCG/ACT AERS INHALE 2 PUFFS BY MOUTH INTO THE LUNGS DAILY 10/19/23  Yes Dgayli, Belva, MD  traMADol  (ULTRAM ) 50 MG tablet Take 50 mg by mouth 3 (three) times daily as needed. 04/11/24  Yes [provider]  Blood Glucose Monitoring Suppl DEVI 1 each by Does not apply route as directed. Dispense based on patient and insurance preference. Use up to four times daily as directed. (FOR ICD-10 E10.9, E11.9). 04/12/24   Melvin Pao, NP  Blood Pressure Monitoring (BLOOD PRESSURE CUFF) MISC Check blood pressure as instructed by your physician 12/08/23   Franchester, Cadence H, PA-C  Glucose Blood (BLOOD GLUCOSE TEST STRIPS) STRP 1 each by Does not apply route as directed. Dispense based on patient and insurance preference. Use up to four times daily as directed. (FOR ICD-10 E10.9, E11.9). 04/12/24   Melvin Pao, NP  Lancet Device MISC  1 each by Does not apply route as directed. Dispense based on patient and insurance preference. Use up to four times daily as directed. (FOR ICD-10 E10.9, E11.9). 04/12/24   Melvin Pao, NP  Lancets MISC 1 each by Does not apply route as directed. Dispense based on patient and insurance preference. Use up to four times daily as directed. (FOR ICD-10 E10.9, E11.9). 04/12/24   Melvin Pao, NP  nitroGLYCERIN  (NITROSTAT ) 0.4 MG SL tablet Place 1 tablet (0.4 mg total) under the tongue every 5 (five) minutes as needed for chest pain. This may be repeated twice and please call 911 when taking the third dose. 12/08/23 04/16/24  Furth, Cadence H, PA-C    Scheduled Meds:  apixaban   5 mg Oral BID   atorvastatin   40 mg Oral Daily   digoxin   62.5 mcg Oral Daily   doxycycline   100 mg Oral Q12H   empagliflozin   10 mg Oral Daily   furosemide   40 mg Oral BID   ipratropium-albuterol   3 mL Nebulization  Q6H WA   metoprolol  succinate  50 mg Oral Daily   pantoprazole   40 mg Oral BID   sacubitril -valsartan   1 tablet Oral BID   Continuous Infusions:  PRN Meds: acetaminophen  **OR** acetaminophen , alum & mag hydroxide-simeth, senna-docusate, traMADol   Allergies:   No Known Allergies  Social History:   Social History   Socioeconomic History   Marital status: Single    Spouse name: Not on file   Number of children: Not on file   Years of education: Not on file   Highest education level: Not on file  Occupational History   Occupation: retired  Tobacco Use   Smoking status: Some Days    Current packs/day: 0.00    Types: Pipe, Cigarettes    Last attempt to quit: 12/27/2022    Years since quitting: 1.3   Smokeless tobacco: Never  Vaping Use   Vaping status: Never Used  Substance and Sexual Activity   Alcohol use: Yes    Alcohol/week: 2.0 standard drinks of alcohol    Types: 2 Cans of beer per week    Comment: 2 40oz beers/week   Drug use: No   Sexual activity: Not Currently  Other Topics Concern   Not on file  Social History Narrative   Not on file   Social Drivers of Health   Financial Resource Strain: Low Risk  (03/27/2024)   Overall Financial Resource Strain (CARDIA)    Difficulty of Paying Living Expenses: Not very hard  Food Insecurity: No Food Insecurity (05/02/2024)   Hunger Vital Sign    Worried About Running Out of Food in the Last Year: Never true    Ran Out of Food in the Last Year: Never true  Transportation Needs: No Transportation Needs (05/02/2024)   PRAPARE - Administrator, Civil Service (Medical): No    Lack of Transportation (Non-Medical): No  Recent Concern: Transportation Needs - Unmet Transportation Needs (03/27/2024)   PRAPARE - Transportation    Lack of Transportation (Medical): Yes    Lack of Transportation (Non-Medical): Yes  Physical Activity: Inactive (07/27/2022)   Exercise Vital Sign    Days of Exercise per Week: 0 days     Minutes of Exercise per Session: 0 min  Stress: No Stress Concern Present (07/27/2022)   [employer] of Occupational Health - Occupational Stress Questionnaire    Feeling of Stress : Not at all  Social Connections: Socially Isolated (05/02/2024)   Social Connection and Isolation  Panel    Frequency of Communication with Friends and Family: More than three times a week    Frequency of Social Gatherings with Friends and Family: More than three times a week    Attends Religious Services: Never    Database Administrator or Organizations: No    Attends Banker Meetings: Never    Marital Status: Divorced  Catering Manager Violence: Not At Risk (05/02/2024)   Humiliation, Afraid, Rape, and Kick questionnaire    Fear of Current or Ex-Partner: No    Emotionally Abused: No    Physically Abused: No    Sexually Abused: No    Family History:    Family History  Problem Relation Age of Onset   Diabetes Mother    Diabetes Father    Heart disease Brother    Heart attack Brother    Diabetes Brother    Kidney failure Brother    Thyroid  disease Neg Hx      ROS:  Please see the history of present illness.   All other ROS reviewed and negative.     Physical Exam/Data: Vitals:   05/05/24 0426 05/05/24 0500 05/05/24 0839 05/05/24 1218  BP: (!) 157/138  (!) 161/86 129/77  Pulse: (!) 52  71 (!) 59  Resp: 17  (!) 21 18  Temp: 98.6 F (37 C)  98.1 F (36.7 C) 98 F (36.7 C)  TempSrc:   Oral Oral  SpO2: 95%  100% 100%  Weight:  121.7 kg    Height:        Intake/Output Summary (Last 24 hours) at 05/05/2024 1517 Last data filed at 05/05/2024 1409 Gross per 24 hour  Intake 840 ml  Output 1600 ml  Net -760 ml      05/05/2024    5:00 AM 05/04/2024    5:39 AM 05/03/2024    5:44 AM  Last 3 Weights  Weight (lbs) 268 lb 4.8 oz 267 lb 6.7 oz 265 lb 14 oz  Weight (kg) 121.7 kg 121.3 kg 120.6 kg     Body mass index is 36.39 kg/m.  General:  Well nourished, well  developed, in no acute distress HEENT: normal Neck: no JVD Vascular: No carotid bruits; Distal pulses 2+ bilaterally Cardiac:  normal S1, S2; RRR; no murmur  Lungs: Diminished breath sounds bilaterally, no wheezing Abd: soft, nontender, no hepatomegaly  Ext: no edema Musculoskeletal:  No deformities, left wrist pain. Skin: warm and dry  Neuro:  CNs 2-12 intact, no focal abnormalities noted Psych:  Normal affect   EKG:  The EKG was personally reviewed and demonstrates: Atrial fibrillation Telemetry:  Telemetry was personally reviewed and demonstrates: Atrial fibrillation  Relevant CV Studies: Echo 11/25 EF 40 to 45%  Laboratory Data: High Sensitivity Troponin:   Recent Labs  Lab 04/13/24 0102 04/13/24 0310  TROPONINIHS 10 9     Chemistry Recent Labs  Lab 05/01/24 1257 05/02/24 0549 05/03/24 0340 05/04/24 0431 05/05/24 0502  NA 136   < > 139 135 134*  K 4.3   < > 3.3* 4.0 3.7  CL 103   < > 102 98 98  CO2 21*   < > 26 26 26   GLUCOSE 101*   < > 124* 94 107*  BUN 8   < > 16 15 14   CREATININE 0.77   < > 0.87 0.77 0.76  CALCIUM  8.8*   < > 8.8* 8.4* 8.8*  MG 2.0  --   --   --   --  GFRNONAA >60   < > >60 >60 >60  ANIONGAP 13   < > 11 11 11    < > = values in this interval not displayed.    No results for input(s): PROT, ALBUMIN, AST, ALT, ALKPHOS, BILITOT in the last 168 hours. Lipids No results for input(s): CHOL, TRIG, HDL, LABVLDL, LDLCALC, CHOLHDL in the last 168 hours.  Hematology Recent Labs  Lab 05/01/24 1257 05/02/24 0549  WBC 4.1 2.6*  RBC 3.99* 3.46*  HGB 11.4* 9.7*  HCT 34.9* 30.0*  MCV 87.5 86.7  MCH 28.6 28.0  MCHC 32.7 32.3  RDW 15.6* 15.3  PLT 179 166   Thyroid  No results for input(s): TSH, FREET4 in the last 168 hours.  BNP Recent Labs  Lab 05/01/24 1257  PROBNP 5,875.0*    DDimer No results for input(s): DDIMER in the last 168 hours.  Radiology/Studies:  CT Angio Chest Pulmonary Embolism (PE) W or WO  Contrast Result Date: 05/04/2024 CLINICAL DATA:  Short of breath EXAM: CT ANGIOGRAPHY CHEST WITH CONTRAST TECHNIQUE: Multidetector CT imaging of the chest was performed using the standard protocol during bolus administration of intravenous contrast. Multiplanar CT image reconstructions and MIPs were obtained to evaluate the vascular anatomy. RADIATION DOSE REDUCTION: This exam was performed according to the departmental dose-optimization program which includes automated exposure control, adjustment of the mA and/or kV according to patient size and/or use of iterative reconstruction technique. CONTRAST:  75mL OMNIPAQUE  IOHEXOL  350 MG/ML SOLN COMPARISON:  06/14/2022. Chest radiographs, most recent dated 05/01/2024. FINDINGS: Cardiovascular: Pulmonary arteries are well opacified. There is no evidence of a pulmonary embolism. Heart is mildly enlarged. Three-vessel coronary artery calcifications. No pericardial effusion. Ascending thoracic aorta is dilated to 4.7 cm. Main pulmonary artery is dilated to 4.3 cm. Mediastinum/Nodes: No neck base, mediastinal or hilar masses. Mildly prominent mediastinal and hilar lymph nodes, all subcentimeter and stable from the prior CT. Trachea esophagus are unremarkable. Lungs/Pleura: Bronchial wall thickening most evident in the lower lobes. No lung consolidation. No evidence of edema. Mild centrilobular emphysema. No lung mass or suspicious nodule. No pleural effusion or pneumothorax. Upper Abdomen: No acute findings. Nodular contour of the liver. Aortic atherosclerosis with dilated aorta at its hiatus measuring 4.1 cm. Musculoskeletal: No fracture or acute finding.  No bone lesion. Review of the MIP images confirms the above findings. IMPRESSION: 1. No evidence of a pulmonary embolism. 2. No evidence of pneumonia or pulmonary edema. 3. Dilated aorta, ascending portion measuring 4.7 cm, stable from the prior CT. Ascending thoracic aortic aneurysm. Recommend semi-annual imaging  followup by CTA or MRA and referral to cardiothoracic surgery if not already obtained. This recommendation follows 2010 ACCF/AHA/AATS/ACR/ASA/SCA/SCAI/SIR/STS/SVM Guidelines for the Diagnosis and Management of Patients With Thoracic Aortic Disease. Circulation. 2010; 121: [id number]. Aortic aneurysm NOS (ICD10-I71.9) 4. Dilated main pulmonary artery suggesting pulmonary artery hypertension, also stable from the prior CT. 5. Bronchial wall thickening and may be chronic. Consider acute bronchitis in the proper clinical setting. 6. Nodular liver contour suggesting cirrhosis. Aortic Atherosclerosis (ICD10-I70.0) and Emphysema (ICD10-J43.9). Electronically Signed   By: Alm Parkins M.D.   On: 05/04/2024 13:39     Assessment and Plan: NICM s/p ICD, last EF 40 to 45% - Abdominal distention, no edema, clear lungs. - PTA Lasix  40 mg daily, restart Aldactone  at reduced dose 12.5 mg daily - Continue Toprol -XL 50 mg daily, Entresto  49/51 mg twice daily, digoxin , Jardiance . -- Left heart cath 7/25 with no obstructive disease - No indication for ischemic workup. - Shortness  of breath mostly driven by obesity, COPD, deconditioning. - Continue current cardiac meds as prescribed. - Encourage PT OT.  2.  Permanent A-fib - Eliquis  5 mg twice daily, Toprol -XL 50 mg daily  Signed, Redell Cave, MD  05/05/2024 3:17 PM

## 2024-05-05 NOTE — Progress Notes (Signed)
 Mobility Specialist - Progress Note  Pre-mobility: HR-110,  SpO2-95%  During mobility: HR-160, SpO2-98%  Post-mobility: HR-110,  SPO2-98%   05/05/24 1100  Mobility  Activity Ambulated with assistance;Stood at bedside;Dangled on edge of bed;Respositioned in chair  Level of Assistance Contact guard assist, steadying assist  Assistive Device Cane  Distance Ambulated (ft) 20 ft  Range of Motion/Exercises Active;All extremities  Activity Response Tolerated well  Mobility visit 1 Mobility  Mobility Specialist Start Time (ACUTE ONLY) 1008  Mobility Specialist Stop Time (ACUTE ONLY) 1047  Mobility Specialist Time Calculation (min) (ACUTE ONLY) 39 min   Pt was supine in bed with the HOB elevated on O2 @ 2 L upon entry. Pt agreed to mobility. Pt did describe left wrist pain being present. Pt was able today to get to the EOB independently with bed features and minA. Pt is able to STS independently with cane. Pt vitals were taken throughout activity as a precaution. Pt HR did increase during activity and nurse brought to pt attention. Pt O2 vitals remained WNL. Pt ambulated well. Pt did have a BM. After BM pt returned to his room repositioned in the recliner with needs in reach.   Clem Rodes Mobility Specialist 05/05/24, 4:41 PM

## 2024-05-05 NOTE — Plan of Care (Signed)
 Pt has been resting. No distress noted. No shortness of breath noted. Pt verbalized pain to back prn tramadol  given per order. Call button within reach.   Problem: Education: Goal: Knowledge of General Education information will improve Description: Including pain rating scale, medication(s)/side effects and non-pharmacologic comfort measures Outcome: Progressing   Problem: Health Behavior/Discharge Planning: Goal: Ability to manage health-related needs will improve Outcome: Progressing   Problem: Clinical Measurements: Goal: Ability to maintain clinical measurements within normal limits will improve Outcome: Progressing Goal: Will remain free from infection Outcome: Progressing Goal: Diagnostic test results will improve Outcome: Progressing Goal: Respiratory complications will improve Outcome: Progressing Goal: Cardiovascular complication will be avoided Outcome: Progressing   Problem: Activity: Goal: Risk for activity intolerance will decrease Outcome: Progressing   Problem: Nutrition: Goal: Adequate nutrition will be maintained Outcome: Progressing   Problem: Elimination: Goal: Will not experience complications related to bowel motility Outcome: Progressing Goal: Will not experience complications related to urinary retention Outcome: Progressing   Problem: Safety: Goal: Ability to remain free from injury will improve Outcome: Progressing   Problem: Skin Integrity: Goal: Risk for impaired skin integrity will decrease Outcome: Progressing

## 2024-05-05 NOTE — Progress Notes (Signed)
 PROGRESS NOTE    Eugene Henderson   FMW:[id number] DOB: 03-17-1951  DOA: 05/01/2024 Date of Service: 05/05/24 which is hospital day 4  PCP: Louder Duwaine SQUIBB, [facility] course / significant events:   Eugene Henderson is a 73 y.o. year old male with medical history of HTN, HLD, T2DM, HFrEF (recent echo with EF 40 %), COPD presenting with worsening shortness of breath.   HPI: Pt states he woke up Tuesday 05/01/24 4am w/ worsening shortness of breath. Inhalers were not helping. He used them multiple times. Called EMS. No fevers or chills but has been coughing with sputum production, some shortness of breath over the last week. He always has dyspnea on exertion limiting him to 20-30 feet but it has worsened significantly over the last few days.    11/25: to ED, Afib RVR, EDP gave him IV Dilt push and restarted his metoprolol  with improvement in his rate.admitted to hospitalist for Afib RVR likely d/t COPD exacerbation (tx steroids, doxycycline , nebs, NOT needing O2), possible CHF exacerbation (IV lasix  and diuresing well w/ this). 11/26: continuing diuresis, out total almost 3L UOP today 11/27: out another 2L UOP yesterday , continuing diuresis 11/28: UOP slowing down some, still very SOB w/ walking. CTA chest no effusion/edema, no PE.  11/29: still significant SOB and tachycardia on minimal exertion. Diuresis to po. Suspect most likely deconditioning / COPD primary cause for SOB symptoms but will ask cardiology to weigh in re: other workup while inpatient      Consultants:  Cardiology   Procedures/Surgeries: none      ASSESSMENT & PLAN:   Atrial fibrillation with RVR D/t missed his metoprolol  also worsened by him repeatedly using his inhaler for his shortness of breath  EDP gave him IV Dilt push and restarted his metoprolol  with improvement in his rate.  Restarted home Eliquis , digoxin , metoprolol  Telemetry   COPD exacerbation:  prednisone   doxycycline .   Nebulizers  scheduled O2 as needed - amb walk test w/ O2 requested, may need home O2  Incentive spirometer encouraged    CHF exacerbation:  Echo done 04/26/24 He is not sure of his medications  Lasix  held on last discharge d/t low BP, probably he needed that rx   IV diuretics --> po  Strict I&O GDMT as BP allows, see med rec - metoprolol , entresto , statin, eliquis   Given known reason for exacerbation and initial good response to diuresis, held off on cardiology consult at first but however significant SOB on minimal exertion and pulmonary findings on CT not impressive - consulted cardiology today non-urgent    Significant SOB and tachycardia on minimal exertion. Suspect most likely deconditioning / COPD primary cause for SOB symptoms  will ask cardiology to weigh in re: other workup while inpatient   Tobacco use disorder Counseled on cessation   Type 2 diabetes Mellitus well controlled: Monitor CBGs    Hyperlipidemia:  home statin   Deconditioning PT/OT --> home health  Wrist pain apparent sprain  Pt reports twisting/straining while using BSC yesterday Ice to painful area Wrist brace/splint ordered  Hx/exam c/w sprain will not get imaging at this time    Class 2 obesity based on BMI: Body mass index is 36.39 kg/m.SABRA Significantly low or high BMI is associated with higher medical risk.  Underweight - under 18  overweight - 25 to 29 obese - 30 or more Class 1 obesity: BMI of 30.0 to 34 Class 2 obesity: BMI of 35.0 to 39 Class 3 obesity: BMI  of 40.0 to 49 Super Morbid Obesity: BMI 50-59 Super-super Morbid Obesity: BMI 60+ Healthy nutrition and physical activity advised as adjunct to other disease management and risk reduction treatments    DVT prophylaxis: eliquis  IV fluids: no continuous IV fluids  Nutrition: cardiac / carb Central lines / other devices: none  Code Status: FULL CODE ACP documentation reviewed:  none on file in [facility]  TOC needs: home health  Medical barriers  to dispo: pending further diuresis. Expected medical readiness for discharge tomorrow              Subjective / Brief ROS:  Patient reports still some SOB worse w/ ambulation States some SOB at home but here it is almost instant any time he moves around and it persists 5-10 min despite rest  Has been resistant to getting OOB but more amenable today  Wrist pain L - repoerts twisted/strained this yesterday while lowering himself to [facility]  Denies CP Pain controlled.  Denies new weakness.  Tolerating diet.  Reports no concerns w/ urination/defecation.   Family Communication: none at this time     Objective Findings:  Vitals:   05/05/24 0426 05/05/24 0500 05/05/24 0839 05/05/24 1218  BP: (!) 157/138  (!) 161/86 129/77  Pulse: (!) 52  71 (!) 59  Resp: 17  (!) 21 18  Temp: 98.6 F (37 C)  98.1 F (36.7 C) 98 F (36.7 C)  TempSrc:   Oral Oral  SpO2: 95%  100% 100%  Weight:  121.7 kg    Height:        Intake/Output Summary (Last 24 hours) at 05/05/2024 1541 Last data filed at 05/05/2024 1409 Gross per 24 hour  Intake 840 ml  Output 1600 ml  Net -760 ml   Filed Weights   05/03/24 0544 05/04/24 0539 05/05/24 0500  Weight: 120.6 kg 121.3 kg 121.7 kg    Examination:  Physical Exam Constitutional:      General: He is not in acute distress. Cardiovascular:     Rate and Rhythm: Normal rate. Rhythm irregular.  Pulmonary:     Breath sounds: Examination of the right-lower field reveals decreased breath sounds. Examination of the left-lower field reveals decreased breath sounds. Decreased breath sounds present. No wheezing or rales (fainter).  Musculoskeletal:     Right lower leg: No edema.     Left lower leg: No edema.  Skin:    General: Skin is warm and dry.  Neurological:     Mental Status: He is alert and oriented to person, place, and time.  Psychiatric:        Behavior: Behavior normal.          Scheduled Medications:   apixaban   5 mg Oral BID    atorvastatin   40 mg Oral Daily   digoxin   62.5 mcg Oral Daily   doxycycline   100 mg Oral Q12H   empagliflozin   10 mg Oral Daily   furosemide   40 mg Oral Daily   ipratropium-albuterol   3 mL Nebulization Q6H WA   metoprolol  succinate  50 mg Oral Daily   pantoprazole   40 mg Oral BID   sacubitril -valsartan   1 tablet Oral BID   spironolactone   12.5 mg Oral Daily    Continuous Infusions:   PRN Medications:  acetaminophen  **OR** acetaminophen , alum & mag hydroxide-simeth, senna-docusate, traMADol   Antimicrobials from admission:  Anti-infectives (From admission, onward)    Start     Dose/Rate Route Frequency Ordered Stop   05/02/24 0215  doxycycline  (VIBRA -TABS) tablet  100 mg        100 mg Oral Every 12 hours 05/02/24 0214 05/06/24 2159   05/01/24 1600  doxycycline  (VIBRAMYCIN ) 100 mg in sodium chloride  0.9 % 250 mL IVPB        100 mg 125 mL/hr over 120 Minutes Intravenous  Once 05/01/24 1549 05/01/24 1834           Data Reviewed:  I have personally reviewed the following...  CBC: Recent Labs  Lab 05/01/24 1257 05/02/24 0549  WBC 4.1 2.6*  HGB 11.4* 9.7*  HCT 34.9* 30.0*  MCV 87.5 86.7  PLT 179 166   Basic Metabolic Panel: Recent Labs  Lab 05/01/24 1257 05/02/24 0549 05/03/24 0340 05/04/24 0431 05/05/24 0502  NA 136 136 139 135 134*  K 4.3 4.0 3.3* 4.0 3.7  CL 103 102 102 98 98  CO2 21* 22 26 26 26   GLUCOSE 101* 157* 124* 94 107*  BUN 8 12 16 15 14   CREATININE 0.77 0.77 0.87 0.77 0.76  CALCIUM  8.8* 8.9 8.8* 8.4* 8.8*  MG 2.0  --   --   --   --    GFR: Estimated Creatinine Clearance: 110.7 mL/min (by C-G formula based on SCr of 0.76 mg/dL). Liver Function Tests: No results for input(s): AST, ALT, ALKPHOS, BILITOT, PROT, ALBUMIN in the last 168 hours. No results for input(s): LIPASE, AMYLASE in the last 168 hours. No results for input(s): AMMONIA in the last 168 hours. Coagulation Profile: No results for input(s): INR, PROTIME in  the last 168 hours. Cardiac Enzymes: No results for input(s): CKTOTAL, CKMB, CKMBINDEX, TROPONINI in the last 168 hours. BNP (last 3 results) Recent Labs    05/01/24 1257  PROBNP 5,875.0*   HbA1C: No results for input(s): HGBA1C in the last 72 hours.  CBG: Recent Labs  Lab 05/02/24 1751 05/03/24 0820 05/03/24 1123 05/04/24 1110 05/04/24 1659  GLUCAP 155* 93 99 117* 116*   Lipid Profile: No results for input(s): CHOL, HDL, LDLCALC, TRIG, CHOLHDL, LDLDIRECT in the last 72 hours. Thyroid  Function Tests: No results for input(s): TSH, T4TOTAL, FREET4, T3FREE, THYROIDAB in the last 72 hours. Anemia Panel: No results for input(s): VITAMINB12, FOLATE, FERRITIN, TIBC, IRON, RETICCTPCT in the last 72 hours. Most Recent Urinalysis On File:     Component Value Date/Time   COLORURINE YELLOW (A) 04/13/2024 0025   APPEARANCEUR CLOUDY (A) 04/13/2024 0025   APPEARANCEUR Cloudy (A) 11/09/2023 0928   LABSPEC 1.010 04/13/2024 0025   PHURINE 5.0 04/13/2024 0025   GLUCOSEU >=500 (A) 04/13/2024 0025   HGBUR LARGE (A) 04/13/2024 0025   BILIRUBINUR NEGATIVE 04/13/2024 0025   BILIRUBINUR negative 01/04/2024 1545   BILIRUBINUR Negative 11/09/2023 0928   KETONESUR NEGATIVE 04/13/2024 0025   PROTEINUR NEGATIVE 04/13/2024 0025   UROBILINOGEN 0.2 01/04/2024 1545   NITRITE NEGATIVE 04/13/2024 0025   LEUKOCYTESUR MODERATE (A) 04/13/2024 0025   Sepsis Labs: @LABRCNTIP (procalcitonin:4,lacticidven:4) Microbiology: Recent Results (from the past 240 hours)  Resp panel by RT-PCR (RSV, Flu A&B, Covid) Anterior Nasal Swab     Status: None   Collection Time: 05/01/24  3:56 PM   Specimen: Anterior Nasal Swab  Result Value Ref Range Status   SARS Coronavirus 2 by RT PCR NEGATIVE NEGATIVE Final    Comment: (NOTE) SARS-CoV-2 target nucleic acids are NOT DETECTED.  The SARS-CoV-2 RNA is generally detectable in upper respiratory specimens during the acute phase  of infection. The lowest concentration of SARS-CoV-2 viral copies this assay can detect is 138 copies/mL. A negative  result does not preclude SARS-Cov-2 infection and should not be used as the sole basis for treatment or other patient management decisions. A negative result may occur with  improper specimen collection/handling, submission of specimen other than nasopharyngeal swab, presence of viral mutation(s) within the areas targeted by this assay, and inadequate number of viral copies(<138 copies/mL). A negative result must be combined with clinical observations, patient history, and epidemiological information. The expected result is Negative.  Fact Sheet for Patients:  bloggercourse.com  Fact Sheet for Healthcare Providers:  seriousbroker.it  This test is no t yet approved or cleared by the [place]  FDA and  has been authorized for detection and/or diagnosis of SARS-CoV-2 by FDA under an Emergency Use Authorization (EUA). This EUA will remain  in effect (meaning this test can be used) for the duration of the COVID-19 declaration under Section 564(b)(1) of the Act, 21 U.S.C.section 360bbb-3(b)(1), unless the authorization is terminated  or revoked sooner.       Influenza A by PCR NEGATIVE NEGATIVE Final   Influenza B by PCR NEGATIVE NEGATIVE Final    Comment: (NOTE) The Xpert Xpress SARS-CoV-2/FLU/RSV plus assay is intended as an aid in the diagnosis of influenza from Nasopharyngeal swab specimens and should not be used as a sole basis for treatment. Nasal washings and aspirates are unacceptable for Xpert Xpress SARS-CoV-2/FLU/RSV testing.  Fact Sheet for Patients: bloggercourse.com  Fact Sheet for Healthcare Providers: seriousbroker.it  This test is not yet approved or cleared by the [place]  FDA and has been authorized for detection and/or diagnosis of  SARS-CoV-2 by FDA under an Emergency Use Authorization (EUA). This EUA will remain in effect (meaning this test can be used) for the duration of the COVID-19 declaration under Section 564(b)(1) of the Act, 21 U.S.C. section 360bbb-3(b)(1), unless the authorization is terminated or revoked.     Resp Syncytial Virus by PCR NEGATIVE NEGATIVE Final    Comment: (NOTE) Fact Sheet for Patients: bloggercourse.com  Fact Sheet for Healthcare Providers: seriousbroker.it  This test is not yet approved or cleared by the [place]  FDA and has been authorized for detection and/or diagnosis of SARS-CoV-2 by FDA under an Emergency Use Authorization (EUA). This EUA will remain in effect (meaning this test can be used) for the duration of the COVID-19 declaration under Section 564(b)(1) of the Act, 21 U.S.C. section 360bbb-3(b)(1), unless the authorization is terminated or revoked.  Performed at [facility], [address], [place], [place] [address]       Radiology Studies last 3 days: CT Angio Chest Pulmonary Embolism (PE) W or WO Contrast Result Date: 05/04/2024 CLINICAL DATA:  Short of breath EXAM: CT ANGIOGRAPHY CHEST WITH CONTRAST TECHNIQUE: Multidetector CT imaging of the chest was performed using the standard protocol during bolus administration of intravenous contrast. Multiplanar CT image reconstructions and MIPs were obtained to evaluate the vascular anatomy. RADIATION DOSE REDUCTION: This exam was performed according to the departmental dose-optimization program which includes automated exposure control, adjustment of the mA and/or kV according to patient size and/or use of iterative reconstruction technique. CONTRAST:  75mL OMNIPAQUE  IOHEXOL  350 MG/ML SOLN COMPARISON:  06/14/2022. Chest radiographs, most recent dated 05/01/2024. FINDINGS: Cardiovascular: Pulmonary arteries are well opacified. There is no evidence of a  pulmonary embolism. Heart is mildly enlarged. Three-vessel coronary artery calcifications. No pericardial effusion. Ascending thoracic aorta is dilated to 4.7 cm. Main pulmonary artery is dilated to 4.3 cm. Mediastinum/Nodes: No neck base, mediastinal or hilar masses. Mildly prominent mediastinal and hilar lymph  nodes, all subcentimeter and stable from the prior CT. Trachea esophagus are unremarkable. Lungs/Pleura: Bronchial wall thickening most evident in the lower lobes. No lung consolidation. No evidence of edema. Mild centrilobular emphysema. No lung mass or suspicious nodule. No pleural effusion or pneumothorax. Upper Abdomen: No acute findings. Nodular contour of the liver. Aortic atherosclerosis with dilated aorta at its hiatus measuring 4.1 cm. Musculoskeletal: No fracture or acute finding.  No bone lesion. Review of the MIP images confirms the above findings. IMPRESSION: 1. No evidence of a pulmonary embolism. 2. No evidence of pneumonia or pulmonary edema. 3. Dilated aorta, ascending portion measuring 4.7 cm, stable from the prior CT. Ascending thoracic aortic aneurysm. Recommend semi-annual imaging followup by CTA or MRA and referral to cardiothoracic surgery if not already obtained. This recommendation follows 2010 ACCF/AHA/AATS/ACR/ASA/SCA/SCAI/SIR/STS/SVM Guidelines for the Diagnosis and Management of Patients With Thoracic Aortic Disease. Circulation. 2010; 121: Z733-z630. Aortic aneurysm NOS (ICD10-I71.9) 4. Dilated main pulmonary artery suggesting pulmonary artery hypertension, also stable from the prior CT. 5. Bronchial wall thickening and may be chronic. Consider acute bronchitis in the proper clinical setting. 6. Nodular liver contour suggesting cirrhosis. Aortic Atherosclerosis (ICD10-I70.0) and Emphysema (ICD10-J43.9). Electronically Signed   By: Alm Parkins M.D.   On: 05/04/2024 13:39          Laneta Blunt, DO [facility] 05/05/2024, 3:41 PM    Dictation software  may have been used to generate the above note. Typos may occur and escape review in typed/dictated notes. Please contact Dr Blunt directly for clarity if needed.  Staff may message me via secure chat in Epic  but this may not receive an immediate response,  please page me for urgent matters!  If 7PM-7AM, please contact night coverage [email or web address]

## 2024-05-06 ENCOUNTER — Inpatient Hospital Stay

## 2024-05-06 DIAGNOSIS — I4891 Unspecified atrial fibrillation: Secondary | ICD-10-CM | POA: Diagnosis not present

## 2024-05-06 DIAGNOSIS — I509 Heart failure, unspecified: Secondary | ICD-10-CM | POA: Diagnosis not present

## 2024-05-06 LAB — GLUCOSE, CAPILLARY: Glucose-Capillary: 125 mg/dL — ABNORMAL HIGH (ref 70–99)

## 2024-05-06 MED ORDER — IPRATROPIUM-ALBUTEROL 0.5-2.5 (3) MG/3ML IN SOLN
3.0000 mL | Freq: Two times a day (BID) | RESPIRATORY_TRACT | Status: DC
  Administered 2024-05-06 – 2024-05-07 (×2): 3 mL via RESPIRATORY_TRACT
  Filled 2024-05-06 (×2): qty 3

## 2024-05-06 MED ORDER — SPIRONOLACTONE 25 MG PO TABS
25.0000 mg | ORAL_TABLET | Freq: Every day | ORAL | Status: DC
  Administered 2024-05-07 – 2024-05-10 (×4): 25 mg via ORAL
  Filled 2024-05-06 (×4): qty 1

## 2024-05-06 NOTE — Progress Notes (Signed)
 PT Cancellation Note  Patient Details Name: Eugene Henderson MRN: [id number] DOB: 12-28-1950   Cancelled Treatment:    Reason Eval/Treat Not Completed: Other (comment)  Pt offered but declined session.  Stated he has been up walking with nursing and mobility tech this am.     Lauraine Gills 05/06/2024, 1:56 PM

## 2024-05-06 NOTE — Progress Notes (Signed)
 PROGRESS NOTE    Eugene Henderson   FMW:[id number] DOB: May 21, 1951  DOA: 05/01/2024 Date of Service: 05/06/24 which is hospital day 5  PCP: Louder Duwaine SQUIBB, [facility] course / significant events:   Eugene Henderson is a 73 y.o. year old male with medical history of HTN, HLD, T2DM, HFrEF (recent echo with EF 40 %), COPD presenting with worsening shortness of breath.   HPI: Pt states he woke up Tuesday 05/01/24 4am w/ worsening shortness of breath. Inhalers were not helping. He used them multiple times. Called EMS. No fevers or chills but has been coughing with sputum production, some shortness of breath over the last week. He always has dyspnea on exertion limiting him to 20-30 feet but it has worsened significantly over the last few days.    11/25: to ED, Afib RVR, EDP gave him IV Dilt push and restarted his metoprolol  with improvement in his rate.admitted to hospitalist for Afib RVR likely d/t COPD exacerbation (tx steroids, doxycycline , nebs, NOT needing O2), possible CHF exacerbation (IV lasix  and diuresing well w/ this). 11/26: continuing diuresis, out total almost 3L UOP today 11/27: out another 2L UOP yesterday , continuing diuresis 11/28: UOP slowing down some, still very SOB w/ walking. CTA chest no effusion/edema, no PE.  11/29: still significant SOB and tachycardia on minimal exertion. Diuresis to po. Suspect most likely deconditioning / COPD primary cause for SOB symptoms but will ask cardiology to weigh in re: other workup while inpatient --> restart aldactone , agree SOB mostly driven by obesity, COPD, deconditioning. Wrist pain pt reports twisted it while using BSC, wrist splint ordered 11/30: continued wrist pain, XR done personal review no fracture but (+)arthritis at base first metacarpal near trapezium. Ambulating on O2 3L needs this at home     Consultants:  Cardiology   Procedures/Surgeries: none      ASSESSMENT & PLAN:   Atrial fibrillation with RVR D/t  missed his metoprolol  also worsened by him repeatedly using his inhaler for his shortness of breath  EDP gave him IV Dilt push and restarted his metoprolol  with improvement in his rate.  Restarted home Eliquis , digoxin , metoprolol  Telemetry   COPD exacerbation:  prednisone   doxycycline .   Nebulizers scheduled O2 as needed - amb walk test w/ O2 requested, may need home O2  Incentive spirometer encouraged    CHF exacerbation:  Echo done 04/26/24 He is not sure of his medications  Lasix  held on last discharge d/t low BP, probably he needed that rx   IV diuretics --> po  Strict I&O GDMT as BP allows, see med rec - metoprolol , entresto , statin, eliquis   Given known reason for exacerbation and initial good response to diuresis, held off on cardiology consult at first but however significant SOB on minimal exertion and pulmonary findings on CT not impressive - consulted cardiology today non-urgent    Acute on likely chronic respiratory failure Will need home O2   Significant SOB and tachycardia on minimal exertion. Suspect most likely deconditioning / COPD primary cause for SOB symptoms  will ask cardiology to weigh in re: other workup while inpatient   Tobacco use disorder Counseled on cessation   Type 2 diabetes Mellitus well controlled: Monitor CBGs    Hyperlipidemia:  home statin   Deconditioning PT/OT --> home health  Wrist pain apparent sprain  Pt reports twisting/straining while using BSC yesterday Ice to painful area Wrist brace/splint ordered  Hx/exam c/w sprain will not get imaging at this time  Class 2 obesity based on BMI: Body mass index is 36.39 kg/m.SABRA Significantly low or high BMI is associated with higher medical risk.  Underweight - under 18  overweight - 25 to 29 obese - 30 or more Class 1 obesity: BMI of 30.0 to 34 Class 2 obesity: BMI of 35.0 to 39 Class 3 obesity: BMI of 40.0 to 49 Super Morbid Obesity: BMI 50-59 Super-super Morbid Obesity: BMI  60+ Healthy nutrition and physical activity advised as adjunct to other disease management and risk reduction treatments    DVT prophylaxis: eliquis  IV fluids: no continuous IV fluids  Nutrition: cardiac / carb Central lines / other devices: none  Code Status: FULL CODE ACP documentation reviewed:  none on file in [facility]  TOC needs: home health  Medical barriers to dispo: pending further diuresis. Expected medical readiness for discharge tomorrow              Subjective / Brief ROS:  Patient reports still some SOB worse w/ ambulation Wrist pain L - repoerts twisted/strained this yesterday but now states he hit it on the commode when he went to sit down,  Denies CP Denies new weakness.  Tolerating diet.  Reports no concerns w/ urination/defecation.   Family Communication: none at this time     Objective Findings:  Vitals:   05/06/24 0850 05/06/24 0900 05/06/24 1249 05/06/24 1506  BP: (!) 148/86  (!) 157/99 (!) 127/93  Pulse:   80 89  Resp: 18 (!) 21 18 18   Temp: 98.3 F (36.8 C)  98.7 F (37.1 C) 98.7 F (37.1 C)  TempSrc: Oral   Oral  SpO2: 100%  94% 100%  Weight:      Height:        Intake/Output Summary (Last 24 hours) at 05/06/2024 1738 Last data filed at 05/06/2024 1418 Gross per 24 hour  Intake 840 ml  Output 1050 ml  Net -210 ml   Filed Weights   05/04/24 0539 05/05/24 0500 05/06/24 0500  Weight: 121.3 kg 121.7 kg 122.9 kg    Examination:  Physical Exam Constitutional:      General: He is not in acute distress. Cardiovascular:     Rate and Rhythm: Normal rate. Rhythm irregular.  Pulmonary:     Breath sounds: Examination of the right-lower field reveals decreased breath sounds. Examination of the left-lower field reveals decreased breath sounds. Decreased breath sounds present. No wheezing or rales.  Musculoskeletal:     Right lower leg: No edema.     Left lower leg: No edema.  Skin:    General: Skin is warm and dry.  Neurological:      Mental Status: He is alert and oriented to person, place, and time.  Psychiatric:        Behavior: Behavior normal.          Scheduled Medications:   apixaban   5 mg Oral BID   atorvastatin   40 mg Oral Daily   digoxin   62.5 mcg Oral Daily   empagliflozin   10 mg Oral Daily   furosemide   40 mg Oral Daily   ipratropium-albuterol   3 mL Nebulization BID   metoprolol  succinate  50 mg Oral Daily   pantoprazole   40 mg Oral BID   sacubitril -valsartan   1 tablet Oral BID   [START ON 05/07/2024] spironolactone   25 mg Oral Daily    Continuous Infusions:   PRN Medications:  acetaminophen  **OR** acetaminophen , alum & mag hydroxide-simeth, ipratropium-albuterol , senna-docusate, traMADol   Antimicrobials from admission:  Anti-infectives (From  admission, onward)    Start     Dose/Rate Route Frequency Ordered Stop   05/02/24 0215  doxycycline  (VIBRA -TABS) tablet 100 mg        100 mg Oral Every 12 hours 05/02/24 0214 05/06/24 0836   05/01/24 1600  doxycycline  (VIBRAMYCIN ) 100 mg in sodium chloride  0.9 % 250 mL IVPB        100 mg 125 mL/hr over 120 Minutes Intravenous  Once 05/01/24 1549 05/01/24 1834           Data Reviewed:  I have personally reviewed the following...  CBC: Recent Labs  Lab 05/01/24 1257 05/02/24 0549  WBC 4.1 2.6*  HGB 11.4* 9.7*  HCT 34.9* 30.0*  MCV 87.5 86.7  PLT 179 166   Basic Metabolic Panel: Recent Labs  Lab 05/01/24 1257 05/02/24 0549 05/03/24 0340 05/04/24 0431 05/05/24 0502  NA 136 136 139 135 134*  K 4.3 4.0 3.3* 4.0 3.7  CL 103 102 102 98 98  CO2 21* 22 26 26 26   GLUCOSE 101* 157* 124* 94 107*  BUN 8 12 16 15 14   CREATININE 0.77 0.77 0.87 0.77 0.76  CALCIUM  8.8* 8.9 8.8* 8.4* 8.8*  MG 2.0  --   --   --   --    GFR: Estimated Creatinine Clearance: 111.3 mL/min (by C-G formula based on SCr of 0.76 mg/dL). Liver Function Tests: No results for input(s): AST, ALT, ALKPHOS, BILITOT, PROT, ALBUMIN in the last 168  hours. No results for input(s): LIPASE, AMYLASE in the last 168 hours. No results for input(s): AMMONIA in the last 168 hours. Coagulation Profile: No results for input(s): INR, PROTIME in the last 168 hours. Cardiac Enzymes: No results for input(s): CKTOTAL, CKMB, CKMBINDEX, TROPONINI in the last 168 hours. BNP (last 3 results) Recent Labs    05/01/24 1257  PROBNP 5,875.0*   HbA1C: No results for input(s): HGBA1C in the last 72 hours.  CBG: Recent Labs  Lab 05/03/24 1123 05/04/24 1110 05/04/24 1659 05/05/24 1653 05/06/24 0845  GLUCAP 99 117* 116* 114* 125*   Lipid Profile: No results for input(s): CHOL, HDL, LDLCALC, TRIG, CHOLHDL, LDLDIRECT in the last 72 hours. Thyroid  Function Tests: No results for input(s): TSH, T4TOTAL, FREET4, T3FREE, THYROIDAB in the last 72 hours. Anemia Panel: No results for input(s): VITAMINB12, FOLATE, FERRITIN, TIBC, IRON, RETICCTPCT in the last 72 hours. Most Recent Urinalysis On File:     Component Value Date/Time   COLORURINE YELLOW (A) 04/13/2024 0025   APPEARANCEUR CLOUDY (A) 04/13/2024 0025   APPEARANCEUR Cloudy (A) 11/09/2023 0928   LABSPEC 1.010 04/13/2024 0025   PHURINE 5.0 04/13/2024 0025   GLUCOSEU >=500 (A) 04/13/2024 0025   HGBUR LARGE (A) 04/13/2024 0025   BILIRUBINUR NEGATIVE 04/13/2024 0025   BILIRUBINUR negative 01/04/2024 1545   BILIRUBINUR Negative 11/09/2023 0928   KETONESUR NEGATIVE 04/13/2024 0025   PROTEINUR NEGATIVE 04/13/2024 0025   UROBILINOGEN 0.2 01/04/2024 1545   NITRITE NEGATIVE 04/13/2024 0025   LEUKOCYTESUR MODERATE (A) 04/13/2024 0025   Sepsis Labs: @LABRCNTIP (procalcitonin:4,lacticidven:4) Microbiology: Recent Results (from the past 240 hours)  Resp panel by RT-PCR (RSV, Flu A&B, Covid) Anterior Nasal Swab     Status: None   Collection Time: 05/01/24  3:56 PM   Specimen: Anterior Nasal Swab  Result Value Ref Range Status   SARS Coronavirus 2  by RT PCR NEGATIVE NEGATIVE Final    Comment: (NOTE) SARS-CoV-2 target nucleic acids are NOT DETECTED.  The SARS-CoV-2 RNA is generally detectable in upper respiratory  specimens during the acute phase of infection. The lowest concentration of SARS-CoV-2 viral copies this assay can detect is 138 copies/mL. A negative result does not preclude SARS-Cov-2 infection and should not be used as the sole basis for treatment or other patient management decisions. A negative result may occur with  improper specimen collection/handling, submission of specimen other than nasopharyngeal swab, presence of viral mutation(s) within the areas targeted by this assay, and inadequate number of viral copies(<138 copies/mL). A negative result must be combined with clinical observations, patient history, and epidemiological information. The expected result is Negative.  Fact Sheet for Patients:  bloggercourse.com  Fact Sheet for Healthcare Providers:  seriousbroker.it  This test is no t yet approved or cleared by the [place]  FDA and  has been authorized for detection and/or diagnosis of SARS-CoV-2 by FDA under an Emergency Use Authorization (EUA). This EUA will remain  in effect (meaning this test can be used) for the duration of the COVID-19 declaration under Section 564(b)(1) of the Act, 21 U.S.C.section 360bbb-3(b)(1), unless the authorization is terminated  or revoked sooner.       Influenza A by PCR NEGATIVE NEGATIVE Final   Influenza B by PCR NEGATIVE NEGATIVE Final    Comment: (NOTE) The Xpert Xpress SARS-CoV-2/FLU/RSV plus assay is intended as an aid in the diagnosis of influenza from Nasopharyngeal swab specimens and should not be used as a sole basis for treatment. Nasal washings and aspirates are unacceptable for Xpert Xpress SARS-CoV-2/FLU/RSV testing.  Fact Sheet for Patients: bloggercourse.com  Fact  Sheet for Healthcare Providers: seriousbroker.it  This test is not yet approved or cleared by the [place]  FDA and has been authorized for detection and/or diagnosis of SARS-CoV-2 by FDA under an Emergency Use Authorization (EUA). This EUA will remain in effect (meaning this test can be used) for the duration of the COVID-19 declaration under Section 564(b)(1) of the Act, 21 U.S.C. section 360bbb-3(b)(1), unless the authorization is terminated or revoked.     Resp Syncytial Virus by PCR NEGATIVE NEGATIVE Final    Comment: (NOTE) Fact Sheet for Patients: bloggercourse.com  Fact Sheet for Healthcare Providers: seriousbroker.it  This test is not yet approved or cleared by the [place]  FDA and has been authorized for detection and/or diagnosis of SARS-CoV-2 by FDA under an Emergency Use Authorization (EUA). This EUA will remain in effect (meaning this test can be used) for the duration of the COVID-19 declaration under Section 564(b)(1) of the Act, 21 U.S.C. section 360bbb-3(b)(1), unless the authorization is terminated or revoked.  Performed at [facility], [address], [place], [place] [address]       Radiology Studies last 3 days: CT Angio Chest Pulmonary Embolism (PE) W or WO Contrast Result Date: 05/04/2024 CLINICAL DATA:  Short of breath EXAM: CT ANGIOGRAPHY CHEST WITH CONTRAST TECHNIQUE: Multidetector CT imaging of the chest was performed using the standard protocol during bolus administration of intravenous contrast. Multiplanar CT image reconstructions and MIPs were obtained to evaluate the vascular anatomy. RADIATION DOSE REDUCTION: This exam was performed according to the departmental dose-optimization program which includes automated exposure control, adjustment of the mA and/or kV according to patient size and/or use of iterative reconstruction technique. CONTRAST:  75mL  OMNIPAQUE  IOHEXOL  350 MG/ML SOLN COMPARISON:  06/14/2022. Chest radiographs, most recent dated 05/01/2024. FINDINGS: Cardiovascular: Pulmonary arteries are well opacified. There is no evidence of a pulmonary embolism. Heart is mildly enlarged. Three-vessel coronary artery calcifications. No pericardial effusion. Ascending thoracic aorta is dilated to 4.7  cm. Main pulmonary artery is dilated to 4.3 cm. Mediastinum/Nodes: No neck base, mediastinal or hilar masses. Mildly prominent mediastinal and hilar lymph nodes, all subcentimeter and stable from the prior CT. Trachea esophagus are unremarkable. Lungs/Pleura: Bronchial wall thickening most evident in the lower lobes. No lung consolidation. No evidence of edema. Mild centrilobular emphysema. No lung mass or suspicious nodule. No pleural effusion or pneumothorax. Upper Abdomen: No acute findings. Nodular contour of the liver. Aortic atherosclerosis with dilated aorta at its hiatus measuring 4.1 cm. Musculoskeletal: No fracture or acute finding.  No bone lesion. Review of the MIP images confirms the above findings. IMPRESSION: 1. No evidence of a pulmonary embolism. 2. No evidence of pneumonia or pulmonary edema. 3. Dilated aorta, ascending portion measuring 4.7 cm, stable from the prior CT. Ascending thoracic aortic aneurysm. Recommend semi-annual imaging followup by CTA or MRA and referral to cardiothoracic surgery if not already obtained. This recommendation follows 2010 ACCF/AHA/AATS/ACR/ASA/SCA/SCAI/SIR/STS/SVM Guidelines for the Diagnosis and Management of Patients With Thoracic Aortic Disease. Circulation. 2010; 121: Z733-z630. Aortic aneurysm NOS (ICD10-I71.9) 4. Dilated main pulmonary artery suggesting pulmonary artery hypertension, also stable from the prior CT. 5. Bronchial wall thickening and may be chronic. Consider acute bronchitis in the proper clinical setting. 6. Nodular liver contour suggesting cirrhosis. Aortic Atherosclerosis (ICD10-I70.0) and  Emphysema (ICD10-J43.9). Electronically Signed   By: Alm Parkins M.D.   On: 05/04/2024 13:39          Mayeli Bornhorst, DO [facility] 05/06/2024, 5:38 PM    Dictation software may have been used to generate the above note. Typos may occur and escape review in typed/dictated notes. Please contact Dr Marsa directly for clarity if needed.  Staff may message me via secure chat in Epic  but this may not receive an immediate response,  please page me for urgent matters!  If 7PM-7AM, please contact night coverage [email or web address]

## 2024-05-06 NOTE — Progress Notes (Signed)
 Pt was on O2 @ 3L at rest w/ SpO2 100% On ambulation, SpO2 to 87% without supplemental O2, and SpO2 was >96% on 3L supplemental O2 via nasal cannula.

## 2024-05-06 NOTE — Plan of Care (Signed)

## 2024-05-06 NOTE — Progress Notes (Signed)
 Report given to RN on 1A, pt being transferred to 1 A 52. Pt has belongings

## 2024-05-06 NOTE — Progress Notes (Signed)
 Mobility Specialist - Progress Note On O2 @ 3L      On RA Pre-mobility: , SpO2-98%            SpO2-94%  During mobility:SpO2-99%                                     SpO2-87% O2 @ 3 L recovered to 99%  Post-mobility:   SPO2-98%   05/06/24 1240  Mobility  Activity Ambulated with assistance;Stood at bedside;Dangled on edge of bed  Level of Assistance Standby assist, set-up cues, supervision of patient - no hands on  Assistive Device [employer] Ambulated (ft) 50 ft  Range of Motion/Exercises Active;All extremities  Activity Response Tolerated fair  Mobility visit 1 Mobility  Mobility Specialist Start Time (ACUTE ONLY) 1120  Mobility Specialist Stop Time (ACUTE ONLY) 1140  Mobility Specialist Time Calculation (min) (ACUTE ONLY) 20 min   Pt was supine in bed with the HOB elevated upon entry. Pt was on O2 @ 3L. Pt agreed to mobility. O2 vitals were taken throughout activity as a precaution. Pt did state that pain in left arm was present. Pt is able today to get to the EOB with bed features. Pt is able to STS with cane independently. Pt was able to ambulate well. During ambulation pt O2 vitals were checked. O2 vitals dropped to 87% without O2. Once O2 @ 3L was provided O2 levels went to >96%. After activity pt returned to the room back in bed with needs in reach and bed alarm on.  Clem Rodes Mobility Specialist 05/06/24, 12:52 PM

## 2024-05-06 NOTE — Progress Notes (Signed)
 O2 sat at rest on room air: _91__  If this is 88% or below, stop, no further testing is needed.  Test with exertion:  O2 sat at exertion on room air: _87__, then  O2 sat at exertion on _3__ L/min of O2: _99__.

## 2024-05-06 NOTE — Plan of Care (Signed)
   Problem: Education: Goal: Knowledge of General Education information will improve Description: Including pain rating scale, medication(s)/side effects and non-pharmacologic comfort measures Outcome: Progressing   Problem: Clinical Measurements: Goal: Ability to maintain clinical measurements within normal limits will improve Outcome: Progressing Goal: Diagnostic test results will improve Outcome: Progressing

## 2024-05-06 NOTE — Progress Notes (Addendum)
 Rounding Note   Patient Name: Eugene Henderson Date of Encounter: 05/06/2024  [facility] Cardiologist: Deatrice Cage, MD   Subjective Doing okay, gets some shortness of breath with ambulation dyspnea.  Denies chest pain.  Scheduled Meds:  apixaban   5 mg Oral BID   atorvastatin   40 mg Oral Daily   digoxin   62.5 mcg Oral Daily   empagliflozin   10 mg Oral Daily   furosemide   40 mg Oral Daily   ipratropium-albuterol   3 mL Nebulization BID   metoprolol  succinate  50 mg Oral Daily   pantoprazole   40 mg Oral BID   sacubitril -valsartan   1 tablet Oral BID   [START ON 05/07/2024] spironolactone   25 mg Oral Daily   Continuous Infusions:  PRN Meds: acetaminophen  **OR** acetaminophen , alum & mag hydroxide-simeth, ipratropium-albuterol , senna-docusate, traMADol    Vital Signs  Vitals:   05/06/24 0850 05/06/24 0900 05/06/24 1249 05/06/24 1506  BP: (!) 148/86  (!) 157/99 (!) 127/93  Pulse:   80 89  Resp: 18 (!) 21 18 18   Temp: 98.3 F (36.8 C)  98.7 F (37.1 C) 98.7 F (37.1 C)  TempSrc: Oral   Oral  SpO2: 100%  94% 100%  Weight:      Height:        Intake/Output Summary (Last 24 hours) at 05/06/2024 1620 Last data filed at 05/06/2024 1418 Gross per 24 hour  Intake 840 ml  Output 1050 ml  Net -210 ml      05/06/2024    5:00 AM 05/05/2024    5:00 AM 05/04/2024    5:39 AM  Last 3 Weights  Weight (lbs) 270 lb 15.1 oz 268 lb 4.8 oz 267 lb 6.7 oz  Weight (kg) 122.9 kg 121.7 kg 121.3 kg      Telemetry Atrial fibrillation, second heart rate 120- Personally Reviewed  ECG   - Personally Reviewed  Physical Exam  GEN: No acute distress.   Neck: No JVD Cardiac: Irregular irregular Respiratory: Diminished breath sounds, no wheezing GI: Soft, nontender, +distended  MS: No edema; No deformity. Neuro:  Nonfocal  Psych: Normal affect   Labs High Sensitivity Troponin:   Recent Labs  Lab 04/13/24 0102 04/13/24 0310  TROPONINIHS 10 9     Chemistry Recent  Labs  Lab 05/01/24 1257 05/02/24 0549 05/03/24 0340 05/04/24 0431 05/05/24 0502  NA 136   < > 139 135 134*  K 4.3   < > 3.3* 4.0 3.7  CL 103   < > 102 98 98  CO2 21*   < > 26 26 26   GLUCOSE 101*   < > 124* 94 107*  BUN 8   < > 16 15 14   CREATININE 0.77   < > 0.87 0.77 0.76  CALCIUM  8.8*   < > 8.8* 8.4* 8.8*  MG 2.0  --   --   --   --   GFRNONAA >60   < > >60 >60 >60  ANIONGAP 13   < > 11 11 11    < > = values in this interval not displayed.    Lipids No results for input(s): CHOL, TRIG, HDL, LABVLDL, LDLCALC, CHOLHDL in the last 168 hours.  Hematology Recent Labs  Lab 05/01/24 1257 05/02/24 0549  WBC 4.1 2.6*  RBC 3.99* 3.46*  HGB 11.4* 9.7*  HCT 34.9* 30.0*  MCV 87.5 86.7  MCH 28.6 28.0  MCHC 32.7 32.3  RDW 15.6* 15.3  PLT 179 166   Thyroid  No results for input(s): TSH,  FREET4 in the last 168 hours.  BNP Recent Labs  Lab 05/01/24 1257  PROBNP 5,875.0*    DDimer No results for input(s): DDIMER in the last 168 hours.   Radiology  No results found.  Cardiac Studies Echo 11/25 EF 40 to 45%  Patient Profile   73 y.o. male with history of NICM EF 40 to 45%, s/p AICD 5/24, permanent A-fib, COPD presenting with worsening shortness of breath, admitted with COPD exacerbation.  Being seen for shortness of breath.  Assessment & Plan  NICM s/p ICD, last EF 40 to 45% - Abdominal distention, no edema, clear lungs. - PTA Lasix  40 mg daily, Aldactone  increased to 25 mg daily. - Continue Toprol -XL 50 mg daily, Entresto  49/51 mg twice daily, digoxin , Jardiance . -- Left heart cath 7/25 with no obstructive disease - No indication for ischemic workup. - Shortness of breath after walking today suggesting significant deconditioning.  Obesity, COPD contributing.  - Continue current cardiac meds as prescribed. - Encourage PT OT.   2.  Permanent A-fib - Eliquis  5 mg twice daily, Toprol -XL 50 mg daily  No additional cardiac testing or intervention planned.   Follow-up with primary cardiologist as outpatient.  Cardiology will sign off.    Signed, Redell Cave, MD  05/06/2024, 4:20 PM

## 2024-05-07 ENCOUNTER — Other Ambulatory Visit: Payer: Self-pay | Admitting: *Deleted

## 2024-05-07 ENCOUNTER — Ambulatory Visit: Attending: Family Medicine

## 2024-05-07 DIAGNOSIS — I4891 Unspecified atrial fibrillation: Secondary | ICD-10-CM | POA: Diagnosis not present

## 2024-05-07 LAB — BASIC METABOLIC PANEL WITH GFR
Anion gap: 11 (ref 5–15)
BUN: 10 mg/dL (ref 8–23)
CO2: 26 mmol/L (ref 22–32)
Calcium: 8.9 mg/dL (ref 8.9–10.3)
Chloride: 95 mmol/L — ABNORMAL LOW (ref 98–111)
Creatinine, Ser: 0.68 mg/dL (ref 0.61–1.24)
GFR, Estimated: >60 mL/min (ref >60)
Glucose, Bld: 120 mg/dL — ABNORMAL HIGH (ref 70–99)
Potassium: 3.5 mmol/L (ref 3.5–5.1)
Sodium: 132 mmol/L — ABNORMAL LOW (ref 135–145)

## 2024-05-07 LAB — GLUCOSE, CAPILLARY: Glucose-Capillary: 128 mg/dL — ABNORMAL HIGH (ref 70–99)

## 2024-05-07 MED ORDER — DICLOFENAC SODIUM 1 % EX GEL
4.0000 g | Freq: Four times a day (QID) | CUTANEOUS | Status: DC | PRN
  Filled 2024-05-07: qty 100

## 2024-05-07 MED ORDER — MORPHINE SULFATE (PF) 2 MG/ML IV SOLN: 2.0000 mg | INTRAVENOUS | Status: DC | PRN

## 2024-05-07 MED ORDER — OXYCODONE HCL 5 MG PO TABS
5.0000 mg | ORAL_TABLET | ORAL | Status: DC | PRN
  Administered 2024-05-07 – 2024-05-10 (×11): 10 mg via ORAL
  Filled 2024-05-07 (×11): qty 2
  Filled 2024-05-07: qty 1

## 2024-05-07 NOTE — Progress Notes (Signed)
 PROGRESS NOTE    Eugene Henderson   FMW:[id number] DOB: Jan 22, 1951  DOA: 05/01/2024 Date of Service: 05/07/24 which is hospital day 6  PCP: Louder Duwaine SQUIBB, [facility] course / significant events:   Eugene Henderson is a 73 y.o. year old male with medical history of HTN, HLD, T2DM, HFrEF (recent echo with EF 40 %), COPD presenting with worsening shortness of breath.   HPI: Pt states he woke up Tuesday 05/01/24 4am w/ worsening shortness of breath. Inhalers were not helping. He used them multiple times. Called EMS. No fevers or chills but has been coughing with sputum production, some shortness of breath over the last week. He always has dyspnea on exertion limiting him to 20-30 feet but it has worsened significantly over the last few days.    11/25: to ED, Afib RVR, EDP gave him IV Dilt push and restarted his metoprolol  with improvement in his rate.admitted to hospitalist for Afib RVR likely d/t COPD exacerbation (tx steroids, doxycycline , nebs, NOT needing O2), possible CHF exacerbation (IV lasix  and diuresing well w/ this). 11/26: continuing diuresis, out total almost 3L UOP today 11/27: out another 2L UOP yesterday , continuing diuresis 11/28: UOP slowing down some, still very SOB w/ walking. CTA chest no effusion/edema, no PE.  11/29: still significant SOB and tachycardia on minimal exertion. Diuresis to po. Suspect most likely deconditioning / COPD primary cause for SOB symptoms but will ask cardiology to weigh in re: other workup while inpatient --> restart aldactone , agree SOB mostly driven by obesity, COPD, deconditioning. Wrist pain pt reports twisted it while using BSC, wrist splint ordered 11/30: continued wrist pain, XR done no fracture but (+)arthritis at base first metacarpal near trapezium. Thumb spica splint ordered. Ambulating on O2 3L needs this at home 12/01: wrist splint not thumb spica, corrected this. Pt reporting severe pain, ortho consulted, no futher recs at this  time can follow outpatient. Requiring IV pain control will hold on DC for now and PT/OT to reassess for function/safety to plan dispo      Consultants:  Cardiology   Procedures/Surgeries: none      ASSESSMENT & PLAN:   Atrial fibrillation with RVR - resolved  D/t missed his metoprolol  also worsened by him repeatedly using his inhaler for his shortness of breath  EDP gave him IV Dilt push and restarted his metoprolol  with improvement in his rate.  Restarted home Eliquis , digoxin , metoprolol  Telemetry   COPD exacerbation - improved  prednisone   doxycycline .   Nebulizers scheduled O2 as needed - amb walk test w/ O2 requested, may need home O2  Incentive spirometer encouraged    CHF exacerbation - resolved  Echo done 04/26/24 He is not sure of his medications  Lasix  held on last discharge d/t low BP, probably he needed that rx   IV diuretics --> po  Strict I&O GDMT as BP allows, see med rec - metoprolol , entresto , statin, eliquis   Given known reason for exacerbation and initial good response to diuresis, held off on cardiology consult at first but however significant SOB on minimal exertion and pulmonary findings on CT not impressive - consulted cardiology today non-urgent    Acute on likely chronic respiratory failure Will need home O2   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 Appreciate orthopedic consult  Continue thumb spica brace Continue with elevation, icing Can consider addition of Voltaren gel Recommend occupational therapy referral for range of motion, pain modalities, edema control Weightbearing and activities as tolerated to  the left upper extremity Low concern for a septic arthritis picture given no significant pain with passive range of motion of the wrist Patient can follow-up as an out patient for further discussion and treatment  Tobacco use disorder Counseled on cessation   Type 2 diabetes Mellitus well controlled: Monitor CBGs     Hyperlipidemia:  home statin   Deconditioning PT/OT --> home health     Class 2 obesity based on BMI: Body mass index is 36.39 kg/m.SABRA Significantly low or high BMI is associated with higher medical risk.  Underweight - under 18  overweight - 25 to 29 obese - 30 or more Class 1 obesity: BMI of 30.0 to 34 Class 2 obesity: BMI of 35.0 to 39 Class 3 obesity: BMI of 40.0 to 49 Super Morbid Obesity: BMI 50-59 Super-super Morbid Obesity: BMI 60+ Healthy nutrition and physical activity advised as adjunct to other disease management and risk reduction treatments    DVT prophylaxis: eliquis  IV fluids: no continuous IV fluids  Nutrition: cardiac / carb Central lines / other devices: none  Code Status: FULL CODE ACP documentation reviewed:  none on file in [facility]  TOC needs: home health  Medical barriers to dispo: pain controlExpected medical readiness for discharge tomorrow              Subjective / Brief ROS:  Patient reports L wrist and shoulder pain worse today  SOB is better  Denies CP Denies new weakness.  Tolerating diet.  Reports no concerns w/ urination/defecation.   Family Communication: none at this time     Objective Findings:  Vitals:   05/07/24 0500 05/07/24 0716 05/07/24 0829 05/07/24 1523  BP:  110/82  125/75  Pulse:  77  98  Resp:  17  18  Temp:  98.3 F (36.8 C)  97.8 F (36.6 C)  TempSrc:  Oral    SpO2:  100% 99% 100%  Weight: 121.7 kg     Height:        Intake/Output Summary (Last 24 hours) at 05/07/2024 1700 Last data filed at 05/07/2024 1440 Gross per 24 hour  Intake 1080 ml  Output 1815 ml  Net -735 ml   Filed Weights   05/05/24 0500 05/06/24 0500 05/07/24 0500  Weight: 121.7 kg 122.9 kg 121.7 kg    Examination:  Physical Exam Constitutional:      General: He is not in acute distress. Cardiovascular:     Rate and Rhythm: Normal rate. Rhythm irregular.  Pulmonary:     Breath sounds: No wheezing or rales.   Musculoskeletal:     Right lower leg: No edema.     Left lower leg: No edema.     Comments: See ortho exam  Pt will not let me move the L shoulder or L wrist today   Skin:    General: Skin is warm and dry.  Neurological:     Mental Status: He is alert and oriented to person, place, and time.  Psychiatric:        Behavior: Behavior normal.          Scheduled Medications:   apixaban   5 mg Oral BID   atorvastatin   40 mg Oral Daily   digoxin   62.5 mcg Oral Daily   empagliflozin   10 mg Oral Daily   furosemide   40 mg Oral Daily   metoprolol  succinate  50 mg Oral Daily   pantoprazole   40 mg Oral BID   sacubitril -valsartan   1 tablet Oral BID  spironolactone   25 mg Oral Daily    Continuous Infusions:   PRN Medications:  acetaminophen  **OR** acetaminophen , alum & mag hydroxide-simeth, ipratropium-albuterol , senna-docusate, traMADol   Antimicrobials from admission:  Anti-infectives (From admission, onward)    Start     Dose/Rate Route Frequency Ordered Stop   05/02/24 0215  doxycycline  (VIBRA -TABS) tablet 100 mg        100 mg Oral Every 12 hours 05/02/24 0214 05/06/24 0836   05/01/24 1600  doxycycline  (VIBRAMYCIN ) 100 mg in sodium chloride  0.9 % 250 mL IVPB        100 mg 125 mL/hr over 120 Minutes Intravenous  Once 05/01/24 1549 05/01/24 1834           Data Reviewed:  I have personally reviewed the following...  CBC: Recent Labs  Lab 05/01/24 1257 05/02/24 0549  WBC 4.1 2.6*  HGB 11.4* 9.7*  HCT 34.9* 30.0*  MCV 87.5 86.7  PLT 179 166   Basic Metabolic Panel: Recent Labs  Lab 05/01/24 1257 05/02/24 0549 05/03/24 0340 05/04/24 0431 05/05/24 0502 05/07/24 0342  NA 136 136 139 135 134* 132*  K 4.3 4.0 3.3* 4.0 3.7 3.5  CL 103 102 102 98 98 95*  CO2 21* 22 26 26 26 26   GLUCOSE 101* 157* 124* 94 107* 120*  BUN 8 12 16 15 14 10   CREATININE 0.77 0.77 0.87 0.77 0.76 0.68  CALCIUM  8.8* 8.9 8.8* 8.4* 8.8* 8.9  MG 2.0  --   --   --   --   --     GFR: Estimated Creatinine Clearance: 110.7 mL/min (by C-G formula based on SCr of 0.68 mg/dL). Liver Function Tests: No results for input(s): AST, ALT, ALKPHOS, BILITOT, PROT, ALBUMIN in the last 168 hours. No results for input(s): LIPASE, AMYLASE in the last 168 hours. No results for input(s): AMMONIA in the last 168 hours. Coagulation Profile: No results for input(s): INR, PROTIME in the last 168 hours. Cardiac Enzymes: No results for input(s): CKTOTAL, CKMB, CKMBINDEX, TROPONINI in the last 168 hours. BNP (last 3 results) Recent Labs    05/01/24 1257  PROBNP 5,875.0*   HbA1C: No results for input(s): HGBA1C in the last 72 hours.  CBG: Recent Labs  Lab 05/04/24 1110 05/04/24 1659 05/05/24 1653 05/06/24 0845 05/07/24 0751  GLUCAP 117* 116* 114* 125* 128*   Lipid Profile: No results for input(s): CHOL, HDL, LDLCALC, TRIG, CHOLHDL, LDLDIRECT in the last 72 hours. Thyroid  Function Tests: No results for input(s): TSH, T4TOTAL, FREET4, T3FREE, THYROIDAB in the last 72 hours. Anemia Panel: No results for input(s): VITAMINB12, FOLATE, FERRITIN, TIBC, IRON, RETICCTPCT in the last 72 hours. Most Recent Urinalysis On File:     Component Value Date/Time   COLORURINE YELLOW (A) 04/13/2024 0025   APPEARANCEUR CLOUDY (A) 04/13/2024 0025   APPEARANCEUR Cloudy (A) 11/09/2023 0928   LABSPEC 1.010 04/13/2024 0025   PHURINE 5.0 04/13/2024 0025   GLUCOSEU >=500 (A) 04/13/2024 0025   HGBUR LARGE (A) 04/13/2024 0025   BILIRUBINUR NEGATIVE 04/13/2024 0025   BILIRUBINUR negative 01/04/2024 1545   BILIRUBINUR Negative 11/09/2023 0928   KETONESUR NEGATIVE 04/13/2024 0025   PROTEINUR NEGATIVE 04/13/2024 0025   UROBILINOGEN 0.2 01/04/2024 1545   NITRITE NEGATIVE 04/13/2024 0025   LEUKOCYTESUR MODERATE (A) 04/13/2024 0025   Sepsis Labs: @LABRCNTIP (procalcitonin:4,lacticidven:4) Microbiology: Recent Results (from the  past 240 hours)  Resp panel by RT-PCR (RSV, Flu A&B, Covid) Anterior Nasal Swab     Status: None   Collection Time: 05/01/24  3:56  PM   Specimen: Anterior Nasal Swab  Result Value Ref Range Status   SARS Coronavirus 2 by RT PCR NEGATIVE NEGATIVE Final    Comment: (NOTE) SARS-CoV-2 target nucleic acids are NOT DETECTED.  The SARS-CoV-2 RNA is generally detectable in upper respiratory specimens during the acute phase of infection. The lowest concentration of SARS-CoV-2 viral copies this assay can detect is 138 copies/mL. A negative result does not preclude SARS-Cov-2 infection and should not be used as the sole basis for treatment or other patient management decisions. A negative result may occur with  improper specimen collection/handling, submission of specimen other than nasopharyngeal swab, presence of viral mutation(s) within the areas targeted by this assay, and inadequate number of viral copies(<138 copies/mL). A negative result must be combined with clinical observations, patient history, and epidemiological information. The expected result is Negative.  Fact Sheet for Patients:  bloggercourse.com  Fact Sheet for Healthcare Providers:  seriousbroker.it  This test is no t yet approved or cleared by the [place]  FDA and  has been authorized for detection and/or diagnosis of SARS-CoV-2 by FDA under an Emergency Use Authorization (EUA). This EUA will remain  in effect (meaning this test can be used) for the duration of the COVID-19 declaration under Section 564(b)(1) of the Act, 21 U.S.C.section 360bbb-3(b)(1), unless the authorization is terminated  or revoked sooner.       Influenza A by PCR NEGATIVE NEGATIVE Final   Influenza B by PCR NEGATIVE NEGATIVE Final    Comment: (NOTE) The Xpert Xpress SARS-CoV-2/FLU/RSV plus assay is intended as an aid in the diagnosis of influenza from Nasopharyngeal swab specimens  and should not be used as a sole basis for treatment. Nasal washings and aspirates are unacceptable for Xpert Xpress SARS-CoV-2/FLU/RSV testing.  Fact Sheet for Patients: bloggercourse.com  Fact Sheet for Healthcare Providers: seriousbroker.it  This test is not yet approved or cleared by the [place]  FDA and has been authorized for detection and/or diagnosis of SARS-CoV-2 by FDA under an Emergency Use Authorization (EUA). This EUA will remain in effect (meaning this test can be used) for the duration of the COVID-19 declaration under Section 564(b)(1) of the Act, 21 U.S.C. section 360bbb-3(b)(1), unless the authorization is terminated or revoked.     Resp Syncytial Virus by PCR NEGATIVE NEGATIVE Final    Comment: (NOTE) Fact Sheet for Patients: bloggercourse.com  Fact Sheet for Healthcare Providers: seriousbroker.it  This test is not yet approved or cleared by the [place]  FDA and has been authorized for detection and/or diagnosis of SARS-CoV-2 by FDA under an Emergency Use Authorization (EUA). This EUA will remain in effect (meaning this test can be used) for the duration of the COVID-19 declaration under Section 564(b)(1) of the Act, 21 U.S.C. section 360bbb-3(b)(1), unless the authorization is terminated or revoked.  Performed at [facility], [address], [place], [place] [address]       Radiology Studies last 3 days: DG Wrist Complete Left Result Date: 05/06/2024 CLINICAL DATA:  Left wrist pain EXAM: LEFT WRIST - COMPLETE 4 VIEW COMPARISON:  None Available. FINDINGS: There is no evidence of fracture or dislocation. Amorphous calcification along the radial carpal joint space. Severe degenerative changes of the thumb carpal metacarpal joint with large subchondral lucencies. Soft tissues are unremarkable. IMPRESSION: 1. No acute fracture or  dislocation. 2. 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 3. Amorphous calcification along the radiocarpal joint space, which may represent chondrocalcinosis. Electronically Signed   By: Limin  Xu M.D.  On: 05/06/2024 19:03   CT Angio Chest Pulmonary Embolism (PE) W or WO Contrast Result Date: 05/04/2024 CLINICAL DATA:  Short of breath EXAM: CT ANGIOGRAPHY CHEST WITH CONTRAST TECHNIQUE: Multidetector CT imaging of the chest was performed using the standard protocol during bolus administration of intravenous contrast. Multiplanar CT image reconstructions and MIPs were obtained to evaluate the vascular anatomy. RADIATION DOSE REDUCTION: This exam was performed according to the departmental dose-optimization program which includes automated exposure control, adjustment of the mA and/or kV according to patient size and/or use of iterative reconstruction technique. CONTRAST:  75mL OMNIPAQUE  IOHEXOL  350 MG/ML SOLN COMPARISON:  06/14/2022. Chest radiographs, most recent dated 05/01/2024. FINDINGS: Cardiovascular: Pulmonary arteries are well opacified. There is no evidence of a pulmonary embolism. Heart is mildly enlarged. Three-vessel coronary artery calcifications. No pericardial effusion. Ascending thoracic aorta is dilated to 4.7 cm. Main pulmonary artery is dilated to 4.3 cm. Mediastinum/Nodes: No neck base, mediastinal or hilar masses. Mildly prominent mediastinal and hilar lymph nodes, all subcentimeter and stable from the prior CT. Trachea esophagus are unremarkable. Lungs/Pleura: Bronchial wall thickening most evident in the lower lobes. No lung consolidation. No evidence of edema. Mild centrilobular emphysema. No lung mass or suspicious nodule. No pleural effusion or pneumothorax. Upper Abdomen: No acute findings. Nodular contour of the liver. Aortic atherosclerosis with dilated aorta at its hiatus measuring 4.1 cm. Musculoskeletal: No fracture or acute finding.  No bone lesion. Review  of the MIP images confirms the above findings. IMPRESSION: 1. No evidence of a pulmonary embolism. 2. No evidence of pneumonia or pulmonary edema. 3. Dilated aorta, ascending portion measuring 4.7 cm, stable from the prior CT. Ascending thoracic aortic aneurysm. Recommend semi-annual imaging followup by CTA or MRA and referral to cardiothoracic surgery if not already obtained. This recommendation follows 2010 ACCF/AHA/AATS/ACR/ASA/SCA/SCAI/SIR/STS/SVM Guidelines for the Diagnosis and Management of Patients With Thoracic Aortic Disease. Circulation. 2010; 121: Z733-z630. Aortic aneurysm NOS (ICD10-I71.9) 4. Dilated main pulmonary artery suggesting pulmonary artery hypertension, also stable from the prior CT. 5. Bronchial wall thickening and may be chronic. Consider acute bronchitis in the proper clinical setting. 6. Nodular liver contour suggesting cirrhosis. Aortic Atherosclerosis (ICD10-I70.0) and Emphysema (ICD10-J43.9). Electronically Signed   By: Alm Parkins M.D.   On: 05/04/2024 13:39          Laneta Blunt, DO [facility] 05/07/2024, 5:00 PM    Dictation software may have been used to generate the above note. Typos may occur and escape review in typed/dictated notes. Please contact Dr Blunt directly for clarity if needed.  Staff may message me via secure chat in Epic  but this may not receive an immediate response,  please page me for urgent matters!  If 7PM-7AM, please contact night coverage [email or web address]

## 2024-05-07 NOTE — Plan of Care (Signed)

## 2024-05-07 NOTE — Plan of Care (Signed)
   Problem: Education: Goal: Knowledge of General Education information will improve Description Including pain rating scale, medication(s)/side effects and non-pharmacologic comfort measures Outcome: Progressing

## 2024-05-07 NOTE — Patient Outreach (Signed)
 Phone call to patient's daughter to follow up on referral for personal care services. Patient's daughter confirmed that patient has been hospitalized with shortness of breath. CSW will place patient on pause and will follow up with patient post discharge from the hospital.   Lenn Mean, LCSW [place]  [facility], [facility] Licensed Clinical Social Worker  Direct Dial: [phone]

## 2024-05-07 NOTE — Consult Note (Signed)
 ORTHOPAEDIC CONSULTATION  REQUESTING PHYSICIAN: Marsa Edelman, DO  Chief Complaint:   Left wrist pain  History of Present Illness: Eugene Henderson is a 73 y.o. male with hypertension, hyperlipidemia, type 2 diabetes, heart failure, COPD who presented to the emergency department last week complaining of shortness of breath.  3 days ago, while in the hospital, he sustained an injury to the left wrist when it got pinned between his body and the bedside commode.  He has been complaining of left wrist pain, swelling and difficulty using that hand/wrist since this occurred.  Radiographs were obtained demonstrating severe arthritic changes of the first [facility] joint no acute fractures or dislocations.  The primary team gave the patient a wrist brace as well as been treating with ice, elevation.   Past Medical History:  Diagnosis Date   Arthritis    Ascending aortic aneurysm    a. 09/2017 Stable TAA - 5.1cm.   Asthma    Chronic systolic CHF (congestive heart failure) (HCC)    a. EF 25-30% by echo in 07/2016 with cath showing no significant CAD b. 01/2017: EF 30-35% with diffuse HK and moderate MR; c. 07/2017 Echo: EF 30-35%, diff hK. Mild MR, mildly dil LA. PASP .   Diverticulitis    Diverticulitis of large intestine with perforation without abscess or bleeding 05/13/2017   Hypertension    Hyperthyroidism    NICM (nonischemic cardiomyopathy) (HCC)    Noncompliance    Persistent atrial fibrillation (HCC)    a. CHA2DS2VASc = 3-->Eliquis  (? compliance).   Past Surgical History:  Procedure Laterality Date   CARDIOVERSION N/A 06/23/2022   Procedure: CARDIOVERSION;  Surgeon: Gardenia Led, DO;  Location: [facility] ORS;  Service: Cardiovascular;  Laterality: N/A;   CARDIOVERSION N/A 06/25/2022   Procedure: CARDIOVERSION;  Surgeon: Gardenia Led, DO;  Location: [facility] ORS;  Service: Cardiovascular;  Laterality: N/A;   CARDIOVERSION  N/A 09/29/2022   Procedure: CARDIOVERSION;  Surgeon: Rolan Ezra RAMAN, MD;  Location: [facility] INVASIVE CV LAB;  Service: Cardiovascular;  Laterality: N/A;   COLONOSCOPY N/A 08/27/2019   Procedure: COLONOSCOPY;  Surgeon: Toledo, Ladell POUR, MD;  Location: [facility] ENDOSCOPY;  Service: Gastroenterology;  Laterality: N/A;   ENDOVASCULAR STENT GRAFT (AAA) N/A 06/17/2022   Procedure: ENDOVASCULAR REPAIR/STENT GRAFT;  Surgeon: Marea Selinda RAMAN, MD;  Location: [facility] INVASIVE CV LAB;  Service: Cardiovascular;  Laterality: N/A;   ICD IMPLANT N/A 11/03/2022   Procedure: ICD IMPLANT;  Surgeon: Cindie Ole DASEN, MD;  Location: [facility] INVASIVE CV LAB;  Service: Cardiovascular;  Laterality: N/A;   JOINT REPLACEMENT Right    RIGHT HEART CATH Right 09/21/2022   Procedure: RIGHT HEART CATH;  Surgeon: Rolan Ezra RAMAN, MD;  Location: [facility] INVASIVE CV LAB;  Service: Cardiovascular;  Laterality: Right;   RIGHT/LEFT HEART CATH AND CORONARY ANGIOGRAPHY N/A 08/02/2016   Procedure: Right/Left Heart Cath and Coronary Angiography;  Surgeon: Deatrice DELENA Cage, MD;  Location: [facility] INVASIVE CV LAB;  Service: Cardiovascular;  Laterality: N/A;   RIGHT/LEFT HEART CATH AND CORONARY ANGIOGRAPHY Bilateral 12/16/2023   Procedure: RIGHT/LEFT HEART CATH AND CORONARY ANGIOGRAPHY;  Surgeon: Cage Deatrice DELENA, MD;  Location: [facility] INVASIVE CV LAB;  Service: Cardiovascular;  Laterality: Bilateral;   TEE WITHOUT CARDIOVERSION N/A 06/23/2022   Procedure: TRANSESOPHAGEAL ECHOCARDIOGRAM;  Surgeon: Gardenia Led, DO;  Location: [facility] ORS;  Service: Cardiovascular;  Laterality: N/A;   TESTICLE SURGERY     Patient states that he had to have the tube fixed.   Social History   Socioeconomic History   Marital status:  Single    Spouse name: Not on file   Number of children: Not on file   Years of education: Not on file   Highest education level: Not on file  Occupational History   Occupation: retired  Tobacco Use   Smoking status: Some Days    Current  packs/day: 0.00    Types: Pipe, Cigarettes    Last attempt to quit: 12/27/2022    Years since quitting: 1.3   Smokeless tobacco: Never  Vaping Use   Vaping status: Never Used  Substance and Sexual Activity   Alcohol use: Yes    Alcohol/week: 2.0 standard drinks of alcohol    Types: 2 Cans of beer per week    Comment: 2 40oz beers/week   Drug use: No   Sexual activity: Not Currently  Other Topics Concern   Not on file  Social History Narrative   Not on file   Social Drivers of Health   Financial Resource Strain: Low Risk  (03/27/2024)   Overall Financial Resource Strain (CARDIA)    Difficulty of Paying Living Expenses: Not very hard  Food Insecurity: No Food Insecurity (05/02/2024)   Hunger Vital Sign    Worried About Running Out of Food in the Last Year: Never true    Ran Out of Food in the Last Year: Never true  Transportation Needs: No Transportation Needs (05/02/2024)   PRAPARE - Administrator, Civil Service (Medical): No    Lack of Transportation (Non-Medical): No  Recent Concern: Transportation Needs - Unmet Transportation Needs (03/27/2024)   PRAPARE - Transportation    Lack of Transportation (Medical): Yes    Lack of Transportation (Non-Medical): Yes  Physical Activity: Inactive (07/27/2022)   Exercise Vital Sign    Days of Exercise per Week: 0 days    Minutes of Exercise per Session: 0 min  Stress: No Stress Concern Present (07/27/2022)   [employer] of Occupational Health - Occupational Stress Questionnaire    Feeling of Stress : Not at all  Social Connections: Socially Isolated (05/02/2024)   Social Connection and Isolation Panel    Frequency of Communication with Friends and Family: More than three times a week    Frequency of Social Gatherings with Friends and Family: More than three times a week    Attends Religious Services: Never    Database Administrator or Organizations: No    Attends Engineer, Structural: Never    Marital  Status: Divorced   Family History  Problem Relation Age of Onset   Diabetes Mother    Diabetes Father    Heart disease Brother    Heart attack Brother    Diabetes Brother    Kidney failure Brother    Thyroid  disease Neg Hx    No Known Allergies Prior to Admission medications   Medication Sig Start Date End Date Taking? Authorizing Provider  acetaminophen  (TYLENOL ) 500 MG tablet Take 1-2 tablets (500-1,000 mg total) by mouth every 6 (six) hours as needed for mild pain (pain score 1-3), fever or headache. Patient taking differently: Take 1,000 mg by mouth 2 (two) times daily. 04/12/23  Yes Dimattia, Megan P, DO  albuterol  (VENTOLIN  HFA) 108 (90 Base) MCG/ACT inhaler Inhale 1-2 puffs into the lungs every 6 (six) hours as needed for wheezing or shortness of breath. 11/09/23  Yes Pontillo, Megan P, DO  atorvastatin  (LIPITOR) 40 MG tablet TAKE 1 TABLET EVERY DAY 05/01/24  Yes Belote, Megan P, DO  digoxin  (LANOXIN ) 0.125 MG  tablet TAKE 1/2 TABLET BY MOUTH EVERY DAY 11/07/23  Yes Cindie Ole DASEN, MD  ELIQUIS  5 MG TABS tablet TAKE 1 TABLET TWICE DAILY 04/30/24  Yes Cindie Ole DASEN, MD  empagliflozin  (JARDIANCE ) 10 MG TABS tablet TAKE 1 TABLET EVERY DAY 04/06/24  Yes Tolin, Megan P, DO  folic acid  (FOLVITE ) 1 MG tablet Take 1 tablet (1 mg total) by mouth daily. 11/09/23  Yes Hawn, Megan P, DO  furosemide  (LASIX ) 40 MG tablet Take 1 tablet (40 mg total) by mouth daily. 11/03/23  Yes Cindie Ole DASEN, MD  metFORMIN  (GLUCOPHAGE ) 500 MG tablet TAKE 1 TABLET TWICE DAILY WITH MEALS 05/01/24  Yes Kerschner, Megan P, DO  metoprolol  succinate (TOPROL -XL) 50 MG 24 hr tablet Take 1 tablet (50 mg total) by mouth daily. Reduced from 150 mg. 04/14/24  Yes Awanda [place], MD  pantoprazole  (PROTONIX ) 40 MG tablet Take 1 tablet (40 mg total) by mouth 2 (two) times daily. 11/09/23  Yes Goodine, Megan P, DO  potassium chloride  SA (KLOR-CON  M) 20 MEQ tablet TAKE 1 TABLET BY MOUTH EVERY DAY 03/09/24  Yes Tatar, Megan P, DO   sacubitril -valsartan  (ENTRESTO ) 49-51 MG Take 1 tablet by mouth 2 (two) times daily. 12/08/23  Yes Furth, Cadence H, PA-C  Semaglutide  (RYBELSUS ) 7 MG TABS Take 1 tablet (7 mg total) by mouth daily. 03/08/24  Yes Schenk, Megan P, DO  spironolactone  (ALDACTONE ) 25 MG tablet Take 1 tablet (25 mg total) by mouth daily. 11/09/23  Yes Recendiz, Megan P, DO  Tiotropium Bromide-Olodaterol (STIOLTO RESPIMAT ) 2.5-2.5 MCG/ACT AERS INHALE 2 PUFFS BY MOUTH INTO THE LUNGS DAILY 10/19/23  Yes Dgayli, Belva, MD  traMADol  (ULTRAM ) 50 MG tablet Take 50 mg by mouth 3 (three) times daily as needed. 04/11/24  Yes [provider]  Blood Glucose Monitoring Suppl DEVI 1 each by Does not apply route as directed. Dispense based on patient and insurance preference. Use up to four times daily as directed. (FOR ICD-10 E10.9, E11.9). 04/12/24   Melvin Pao, NP  Blood Pressure Monitoring (BLOOD PRESSURE CUFF) MISC Check blood pressure as instructed by your physician 12/08/23   Franchester, Cadence H, PA-C  Glucose Blood (BLOOD GLUCOSE TEST STRIPS) STRP 1 each by Does not apply route as directed. Dispense based on patient and insurance preference. Use up to four times daily as directed. (FOR ICD-10 E10.9, E11.9). 04/12/24   Melvin Pao, NP  Lancet Device MISC 1 each by Does not apply route as directed. Dispense based on patient and insurance preference. Use up to four times daily as directed. (FOR ICD-10 E10.9, E11.9). 04/12/24   Melvin Pao, NP  Lancets MISC 1 each by Does not apply route as directed. Dispense based on patient and insurance preference. Use up to four times daily as directed. (FOR ICD-10 E10.9, E11.9). 04/12/24   Melvin Pao, NP  nitroGLYCERIN  (NITROSTAT ) 0.4 MG SL tablet Place 1 tablet (0.4 mg total) under the tongue every 5 (five) minutes as needed for chest pain. This may be repeated twice and please call 911 when taking the third dose. 12/08/23 04/16/24  Franchester, Cadence H, PA-C   Recent Labs     05/05/24 0502 05/07/24 0342  K 3.7 3.5  CL 98 95*  CO2 26 26  BUN 14 10  CREATININE 0.76 0.68  GLUCOSE 107* 120*  CALCIUM  8.8* 8.9   DG Wrist Complete Left Result Date: 05/06/2024 CLINICAL DATA:  Left wrist pain EXAM: LEFT WRIST - COMPLETE 4 VIEW COMPARISON:  None Available. FINDINGS: There is  no evidence of fracture or dislocation. Amorphous calcification along the radial carpal joint space. Severe degenerative changes of the thumb carpal metacarpal joint with large subchondral lucencies. Soft tissues are unremarkable. IMPRESSION: 1. No acute fracture or dislocation. 2. 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 3. Amorphous calcification along the radiocarpal joint space, which may represent chondrocalcinosis. Electronically Signed   By: Limin  Xu M.D.   On: 05/06/2024 19:03     Positive ROS: All other systems have been reviewed and were otherwise negative with the exception of those mentioned in the HPI and as above.  Physical Exam: BP 110/82 (BP Location: Right Arm)   Pulse 77 Comment: apical  Temp 98.3 F (36.8 C) (Oral)   Resp 17   Ht 6' (1.829 m)   Wt 121.7 kg   SpO2 99%   BMI 36.39 kg/m  General:  Alert, no acute distress Psychiatric:  Patient is competent for consent with normal mood and affect    Orthopedic Exam:  Left upper extremity: Mild swelling noted about the dorsal radial and dorsal aspect of the wrist.  Pain with first [facility] grind test and thumb adduction test.  With palpation over the first [facility] joint.  No pain to palpation over the first dorsal extensor compartment.  Mildly positive Finkelstein's and Eichhoff's maneuvers.  Pain with resisted thumb extension.  I am able to passively flex, extend, pronate, supinate the wrist without much pain.  Patient has fairly limited active flexion, extension, pronosupination due to pain and perceived poor effort.  Sensation intact to light touch throughout.  Patient is unable to make a full composite fist  and lacks about 1 to 2 cm of tip to palm distance.  I am able to actively flex all of his digits to form a complete fist.  Digits are warm and well-perfused.  Imaging:  As above: Radiographs of the left wrist were personally interpreted and demonstrate severe first [facility] joint arthritis with significant joint space narrowing, large osteophyte formation and cystic changes as well as metacarpal subsidence  Assessment/Plan: Left first [facility] joint arthritis with exacerbation - Recommend trialing a different type of brace (thumb spica brace) rather than the wrist/forearm brace provided - Continue with elevation, icing - Can consider addition of Voltaren gel - Recommend occupational therapy referral for range of motion, pain modalities, edema control - Weightbearing and activities as tolerated to the left upper extremity - Low concern for a septic arthritis picture given no significant pain with passive range of motion of the wrist - Patient can follow-up as an out patient for further discussion and treatment   Jackquline CANDIE Barrack, MD Orthopaedic Surgery [facility]

## 2024-05-07 NOTE — Progress Notes (Signed)
 Physical Therapy Treatment Patient Details Name: Eugene Henderson MRN: [id number] DOB: 09/15/1950 Today's Date: 05/07/2024   History of Present Illness presented to ER secondary to progressive SOB; admitted for management of afib with RVR, likely secondary to COPD exacerbation    PT Comments  Pt seen this am with c/o L shoulder pain of 8/10 while resting in bed. Pt removed wrist splint stating it was causing more pain. Spica splint to arrive later today for arthritic pain found on recent x-ray. Pt required increased assist for transfers and balance in standing this date due to inability to tolerate wt bearing or active/passive Left shoulder ROM. Unable to progress gait training with pt's personal cane since he occasionally uses L UE for additional external balance support. ModA to stand from raised bed, MinA of 2 to safely take a few steps to bedside chair with SPC. Pt is very unsteady without ability to utilize L UE. Discussed concerns with Nursing/MD. MD to f/u, Ortho consulted, and OT eval to be ordered. Pt has steps to enter home and usually relies on cane and L hand rail. Will reassess tomorrow for safe mobility/discharge. ADD: Spica Splint for L wrist arrived this pm and placed properly on L wrist. Pt without complaints. Will reassess tomorrow.     If plan is discharge home, recommend the following: A little help with walking and/or transfers;A little help with bathing/dressing/bathroom   Can travel by private vehicle        Equipment Recommendations  Other (comment) (TBD for additional needs with new c/o L shoulder pain)    Recommendations for Other Services       Precautions / Restrictions Precautions Precautions: Fall Recall of Precautions/Restrictions: Intact Restrictions Weight Bearing Restrictions Per Provider Order: No Other Position/Activity Restrictions:  (Left wrist spica splint after x-ray revealed severe arthritis)     Mobility  Bed Mobility Overal bed mobility: Needs  Assistance Bed Mobility: Supine to Sit     Supine to sit: Min assist     General bed mobility comments: able to transition supine<>sit modified independent with intermittent use of bedrails, elevated head of bed;    Transfers Overall transfer level: Needs assistance Equipment used: Straight cane Transfers: Sit to/from Stand Sit to Stand: Mod assist, Min assist           General transfer comment:  (Increased assist due to 8/10 Left shoulder and wrist pain from fall into sitting while accessing BSC a few days ago)    Ambulation/Gait Ambulation/Gait assistance: Min Chemical Engineer (Feet): 2 Feet Assistive device: Straight cane Gait Pattern/deviations: Step-to pattern, Decreased step length - right, Decreased step length - left, Staggering left, Staggering right Gait velocity: decreased     General Gait Details:  (Short distance gait due to c/o significant L shoulder and wrist pain, unable to safely ambulate  further)   Stairs             Wheelchair Mobility     Tilt Bed    Modified Rankin (Stroke Patients Only)       Balance Overall balance assessment: Needs assistance Sitting-balance support: No upper extremity supported, Feet supported Sitting balance-Leahy Scale: Good     Standing balance support: Single extremity supported Standing balance-Leahy Scale: Poor Standing balance comment:  (Able to statically stand with single UE support, unable to progress gait due to balance concerns with inability to utilize L UE for additional support)  Communication Communication Communication: No apparent difficulties  Cognition Arousal: Alert Behavior During Therapy: WFL for tasks assessed/performed   PT - Cognitive impairments: No apparent impairments                       PT - Cognition Comments: oriented x4 Following commands: Intact      Cueing Cueing Techniques: Verbal cues  Exercises Other  Exercises Other Exercises: Pt requiring increased assist for transfers and mobility this date due to new onset of L shoulder pain limiting wt bearing and ROM tolerance    General Comments General comments (skin integrity, edema, etc.):  (L UE slightly swollen and painful with slightest movement)      Pertinent Vitals/Pain Pain Assessment Pain Assessment: 0-10 Pain Score: 8  Pain Location:  (L shoulder and wrist) Pain Descriptors / Indicators: Aching, Discomfort, Grimacing, Guarding, Shooting Pain Intervention(s): Limited activity within patient's tolerance, Repositioned, Ice applied    Home Living                          Prior Function            PT Goals (current goals can now be found in the care plan section) Acute Rehab PT Goals Patient Stated Goal: to go to the bathroom    Frequency    Min 2X/week      PT Plan      Co-evaluation              AM-PAC PT 6 Clicks Mobility   Outcome Measure  Help needed turning from your back to your side while in a flat bed without using bedrails?: A Little Help needed moving from lying on your back to sitting on the side of a flat bed without using bedrails?: A Little Help needed moving to and from a bed to a chair (including a wheelchair)?: A Little Help needed standing up from a chair using your arms (e.g., wheelchair or bedside chair)?: A Little Help needed to walk in hospital room?: A Little Help needed climbing 3-5 steps with a railing? : A Lot 6 Click Score: 17    End of Session Equipment Utilized During Treatment: Gait belt Activity Tolerance: Patient limited by pain Patient left: in chair;with call bell/phone within reach;with chair alarm set;Other (comment) (L UE supported on pillow with ice applied) Nurse Communication: Mobility status;Patient requests pain meds PT Visit Diagnosis: Unsteadiness on feet (R26.81);Muscle weakness (generalized) (M62.81)     Time: 8849-8785 PT Time Calculation  (min) (ACUTE ONLY): 24 min  Charges:    $Therapeutic Activity: 23-37 mins PT General Charges $$ ACUTE PT VISIT: 1 Visit                    Darice Bohr, PTA  Darice JAYSON Bohr 05/07/2024, 4:58 PM

## 2024-05-08 ENCOUNTER — Ambulatory Visit: Admitting: Family Medicine

## 2024-05-08 ENCOUNTER — Inpatient Hospital Stay

## 2024-05-08 DIAGNOSIS — M19012 Primary osteoarthritis, left shoulder: Secondary | ICD-10-CM | POA: Diagnosis not present

## 2024-05-08 DIAGNOSIS — S43002A Unspecified subluxation of left shoulder joint, initial encounter: Secondary | ICD-10-CM | POA: Diagnosis not present

## 2024-05-08 LAB — GLUCOSE, CAPILLARY: Glucose-Capillary: 137 mg/dL — ABNORMAL HIGH (ref 70–99)

## 2024-05-08 NOTE — Plan of Care (Signed)
   Problem: Health Behavior/Discharge Planning: Goal: Ability to manage health-related needs will improve Outcome: Progressing   Problem: Clinical Measurements: Goal: Ability to maintain clinical measurements within normal limits will improve Outcome: Progressing Goal: Will remain free from infection Outcome: Progressing Goal: Diagnostic test results will improve Outcome: Progressing

## 2024-05-08 NOTE — Evaluation (Addendum)
 Occupational Therapy Evaluation Patient Details Name: Eugene Henderson MRN: [id number] DOB: 10-07-1950 Today's Date: 05/08/2024   History of Present Illness   presented to ER secondary to progressive SOB; admitted for management of afib with RVR, likely secondary to COPD exacerbation    Clinical Impressions Pt was seen for OT evaluation this date. Prior to hospital admission, pt was mod indep with dressing, had assist from family for shower transfer and IADL. He uses a SPC at baseline. Pt lives with his daughter. Pt presents with deficits in BLE strength, significant limitations in ROM/strength/GMC/FMC in LUE 2/2 pain, impaired balance, and cardiopulmonary status (HR up to 130's-150's with sitting EOB and standing) affecting safe and optimal ADL completion. Pt currently requires MAX A for LB ADL, MOD A for UB ADL, and unable to even tolerate letting LUE dangle to his side in standing. Pt would benefit from skilled OT services to address noted impairments and functional limitations (see below for any additional details) in order to maximize safety and independence while minimizing future risk of falls, injury, and readmission. Anticipate the need for follow up OT services upon acute hospital DC, <3hrs/day.  Addendum, 1:38pm: elbow imaging reveals subtle nondisplaced fracture of the lateral aspect of the radial head. Per ortho, pt to be nonweightbearing to LUE, sling immobilization 2-3wks with daily pendulum exercises to help prevent stiffness.     If plan is discharge home, recommend the following:   A lot of help with walking and/or transfers;A lot of help with bathing/dressing/bathroom;Assistance with cooking/housework;Assist for transportation;Help with stairs or ramp for entrance     Functional Status Assessment   Patient has had a recent decline in their functional status and demonstrates the ability to make significant improvements in function in a reasonable and predictable amount of  time.     Equipment Recommendations   Other (comment) (defer)     Recommendations for Other Services         Precautions/Restrictions   Precautions Precautions: Fall Recall of Precautions/Restrictions: Intact Restrictions Weight Bearing Restrictions Per Provider Order: Yes LUE Weight Bearing Per Provider Order: Non weight bearing Other Position/Activity Restrictions: Left wrist spica splint after x-ray revealed severe arthritis. Pt also unable to weightbear through any part of his LUE, LUE very pain limited. Pt now with nondisplaced radial head fracture. Per ortho: pt to be nonweightbearing to LUE, sling immobilization 2-3wks with daily pendulum exercises to help prevent stiffness.     Mobility Bed Mobility Overal bed mobility: Needs Assistance Bed Mobility: Supine to Sit     Supine to sit: HOB elevated, Used rails, Min assist          Transfers Overall transfer level: Needs assistance Equipment used: Straight cane Transfers: Sit to/from Stand Sit to Stand: Min assist, From elevated surface, +2 physical assistance, +2 safety/equipment                  Balance Overall balance assessment: Needs assistance Sitting-balance support: Feet supported, Single extremity supported Sitting balance-Leahy Scale: Fair   Postural control: Right lateral lean Standing balance support: Single extremity supported, Reliant on assistive device for balance Standing balance-Leahy Scale: Poor                             ADL either performed or assessed with clinical judgement   ADL Overall ADL's : Needs assistance/impaired    General ADL Comments: Pt currently requires MAX A for LB ADL tasks, MOD A for seated UB  ADL tasks. LUE functionally very limited 2/2 pain      Pertinent Vitals/Pain Pain Assessment Pain Assessment: Faces Faces Pain Scale: Hurts whole lot Pain Location: LUE particularly shoulder and wrist with any movement and with dangling Pain  Descriptors / Indicators: Aching, Discomfort, Grimacing, Guarding, Shooting Pain Intervention(s): Limited activity within patient's tolerance, Monitored during session, Repositioned, RN gave pain meds during session     Extremity/Trunk Assessment Upper Extremity Assessment Upper Extremity Assessment: LUE deficits/detail LUE Deficits / Details: significantly pain limited with any shoulder, elbow, forearm, and wrist ROM LUE: Shoulder pain with ROM;Shoulder pain at rest;Unable to fully assess due to pain LUE Sensation: WNL LUE Coordination: decreased fine motor;decreased gross motor   Lower Extremity Assessment Lower Extremity Assessment: Generalized weakness       Communication Communication Communication: No apparent difficulties   Cognition Arousal: Alert Behavior During Therapy: WFL for tasks assessed/performed Cognition: No family/caregiver present to determine baseline        Following commands: Intact       Cueing  General Comments   Cueing Techniques: Verbal cues  HR up to 130's-150's with sitting and standing           Home Living Family/patient expects to be discharged to:: Private residence Living Arrangements: Children Available Help at Discharge: Family;Available PRN/intermittently Type of Home: House Home Access: Stairs to enter [employer] of Steps: 4 Entrance Stairs-Rails: Right;Left;Can reach both Home Layout: One level     Bathroom Shower/Tub: Chief Strategy Officer: Standard     Home Equipment: Cane - single point   Additional Comments: uses urinal;      Prior Functioning/Environment Prior Level of Function : Needs assist       Physical Assist : Mobility (physical);ADLs (physical)   ADLs (physical): Bathing;IADLs Mobility Comments: Pt reports sedentary lifestyle, would sit in recliner daily and use urinal; limited household ambulation for toileting purposes.  Denies fall history. No home O2. ADLs Comments:  Daughter would help with shower transfer; he reports being mostly modified independent for dressing; daughter did all cooking/cleaning; not driving;    OT Problem List: Decreased strength;Decreased coordination;Pain;Cardiopulmonary status limiting activity;Decreased range of motion;Decreased activity tolerance;Impaired balance (sitting and/or standing);Decreased knowledge of use of DME or AE;Obesity;Impaired UE functional use   OT Treatment/Interventions: Self-care/ADL training;Therapeutic exercise;Therapeutic activities;Splinting;DME and/or AE instruction;Visual/perceptual remediation/compensation;Patient/family education;Balance training;Manual therapy;Modalities      OT Goals(Current goals can be found in the care plan section)   Acute Rehab OT Goals Patient Stated Goal: get better OT Goal Formulation: With patient Time For Goal Achievement: 05/22/24 Potential to Achieve Goals: Good ADL Goals Pt Will Perform Upper Body Dressing: sitting;with min assist Pt Will Perform Lower Body Dressing: sit to/from stand;with min assist Pt Will Transfer to Toilet: ambulating;bedside commode (LRAD) Pt Will Perform Toileting - Clothing Manipulation and hygiene: sitting/lateral leans;sit to/from stand;with min assist Additional ADL Goal #1: Pt will independently manage LUE splint/sling.   OT Frequency:  Min 2X/week    Co-evaluation PT/OT/SLP Co-Evaluation/Treatment: Yes Reason for Co-Treatment: Complexity of the patient's impairments (multi-system involvement);For patient/therapist safety;To address functional/ADL transfers PT goals addressed during session: Mobility/safety with mobility;Balance;Proper use of DME OT goals addressed during session: ADL's and self-care      AM-PAC OT 6 Clicks Daily Activity     Outcome Measure Help from another person eating meals?: A Little Help from another person taking care of personal grooming?: A Little Help from another person toileting, which includes  using toliet, bedpan, or urinal?: A Lot Help  from another person bathing (including washing, rinsing, drying)?: A Lot Help from another person to put on and taking off regular upper body clothing?: A Lot Help from another person to put on and taking off regular lower body clothing?: A Lot 6 Click Score: 14   End of Session Equipment Utilized During Treatment: Gait belt;Oxygen Nurse Communication: Mobility status;Weight bearing status  Activity Tolerance: Patient limited by pain Patient left: in chair;with call bell/phone within reach;with chair alarm set  OT Visit Diagnosis: Other abnormalities of gait and mobility (R26.89);Muscle weakness (generalized) (M62.81);Pain Pain - Right/Left: Left Pain - part of body: Shoulder;Arm                Time: 8874-8842 OT Time Calculation (min): 32 min Charges:  OT General Charges $OT Visit: 1 Visit OT Evaluation $OT Eval Moderate Complexity: 1 Mod  Warren SAUNDERS., MPH, MS, OTR/L ascom [phone] 05/08/24, 1:41 PM

## 2024-05-08 NOTE — NC FL2 (Signed)
 [place]  MEDICAID FL2 LEVEL OF CARE FORM     IDENTIFICATION  Patient Name: Eugene Henderson Birthdate: 1950-08-22 Sex: male Admission Date (Current Location): 05/01/2024  [facility] [place] Number:  Chiropodist and Address:  [facility], [address] [place], [place] [address]      Provider Number: [id number]  Attending Physician Name and Address:  Marsa Edelman, DO  Relative Name and Phone Number:       Current Level of Care: Hospital Recommended Level of Care: Skilled Nursing Facility Prior Approval Number:    Date Approved/Denied:   PASRR Number: [id number]  Discharge Plan: SNF    Current Diagnoses: Patient Active Problem List   Diagnosis Date Noted   Acute on chronic congestive heart failure (HCC) 05/05/2024   A-fib (HCC) 05/02/2024   Atrial fibrillation with rapid ventricular response (HCC) 05/01/2024   Postural dizziness with presyncope 04/13/2024   Orthostatic hypotension 04/13/2024   Urinary tract infection 01/04/2024   Abnormal stress test 12/16/2023   NICM (nonischemic cardiomyopathy) (HCC) 06/16/2022   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   Metal foreign body in head 06/14/2022   Pain in joint of right knee 07/27/2021   Post-traumatic osteoarthritis 07/27/2021   Aortic atherosclerosis 08/27/2020   Acute systolic heart failure (HCC)    AF (paroxysmal atrial fibrillation) (HCC)    Hyperlipidemia    Morbid obesity (HCC) 12/11/2018   Chronic pain of right knee 12/11/2018   Advance directive discussed with patient 05/16/2018   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   Keratitis due to infection 07/06/2017   Corneal ulcer 06/24/2017   COPD (chronic obstructive pulmonary disease) (HCC) 02/03/2017   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) 12/15/2016   AAA (abdominal aortic aneurysm) 11/29/2016   Chronic low back pain 11/29/2016   Alcohol abuse 11/29/2016   Hyponatremia  11/22/2016   Chronic systolic heart failure (HCC) 08/19/2016   Tobacco use 08/19/2016   Persistent atrial fibrillation (HCC) 07/28/2016   Hyperthyroidism 07/28/2016   Noncompliance with medications 07/28/2016   Essential hypertension 07/28/2016    Orientation RESPIRATION BLADDER Height & Weight     Self, Time, Situation, Place  O2 (3 L) Continent Weight: 271 lb 2.7 oz (123 kg) Height:  6' (182.9 cm)  BEHAVIORAL SYMPTOMS/MOOD NEUROLOGICAL BOWEL NUTRITION STATUS      Continent Diet (Heart)  AMBULATORY STATUS COMMUNICATION OF NEEDS Skin   Extensive Assist Verbally Normal                       Personal Care Assistance Level of Assistance  Bathing, Feeding, Dressing Bathing Assistance: Limited assistance Feeding assistance: Independent Dressing Assistance: Limited assistance     Functional Limitations Info  Sight, Speech, Hearing Sight Info: Impaired Hearing Info: Impaired Speech Info: Adequate    SPECIAL CARE FACTORS FREQUENCY  OT (By licensed OT), PT (By licensed PT)     PT Frequency: 5x/week OT Frequency: 5x/week            Contractures      Additional Factors Info  Code Status, Allergies Code Status Info: Full Allergies Info: NKA           Current Medications (05/08/2024):  This is the current hospital active medication list Current Facility-Administered Medications  Medication Dose Route Frequency Provider Last Rate Last Admin   acetaminophen  (TYLENOL ) tablet 650 mg  650 mg Oral Q6H PRN Khan, Ghalib, MD   650 mg at 05/07/24 1555   Or  acetaminophen  (TYLENOL ) suppository 650 mg  650 mg Rectal Q6H PRN Khan, Ghalib, MD       alum & mag hydroxide-simeth (MAALOX/MYLANTA) 200-200-20 MG/5ML suspension 30 mL  30 mL Oral Q4H PRN Alexander, Natalie, DO   30 mL at 05/03/24 1010   apixaban  (ELIQUIS ) tablet 5 mg  5 mg Oral BID Khan, Ghalib, MD   5 mg at 05/08/24 9095   atorvastatin  (LIPITOR) tablet 40 mg  40 mg Oral Daily Khan, Ghalib, MD   40 mg at 05/08/24  0904   diclofenac Sodium (VOLTAREN) 1 % topical gel 4 g  4 g Topical QID PRN Alexander, Natalie, DO       digoxin  (LANOXIN ) tablet 62.5 mcg  62.5 mcg Oral Daily Khan, Ghalib, MD   62.5 mcg at 05/08/24 9095   empagliflozin  (JARDIANCE ) tablet 10 mg  10 mg Oral Daily Khan, Ghalib, MD   10 mg at 05/08/24 9094   furosemide  (LASIX ) tablet 40 mg  40 mg Oral Daily Darliss Rogue, MD   40 mg at 05/08/24 9096   ipratropium-albuterol  (DUONEB) 0.5-2.5 (3) MG/3ML nebulizer solution 3 mL  3 mL Nebulization Q4H PRN Alexander, Natalie, DO       metoprolol  succinate (TOPROL -XL) 24 hr tablet 50 mg  50 mg Oral Daily Alexander, Natalie, DO   50 mg at 05/08/24 9096   morphine  (PF) 2 MG/ML injection 2 mg  2 mg Intravenous Q2H PRN Alexander, Natalie, DO       oxyCODONE  (Oxy IR/ROXICODONE ) immediate release tablet 5-10 mg  5-10 mg Oral Q4H PRN Alexander, Natalie, DO   10 mg at 05/08/24 1130   pantoprazole  (PROTONIX ) EC tablet 40 mg  40 mg Oral BID Khan, Ghalib, MD   40 mg at 05/08/24 9095   sacubitril -valsartan  (ENTRESTO ) 49-51 mg per tablet  1 tablet Oral BID Alexander, Natalie, DO   1 tablet at 05/08/24 9095   senna-docusate (Senokot-S) tablet 1 tablet  1 tablet Oral QHS PRN Fernand Prost, MD       spironolactone  (ALDACTONE ) tablet 25 mg  25 mg Oral Daily Agbor-Etang, Brian, MD   25 mg at 05/08/24 9096     Discharge Medications: Please see discharge summary for a list of discharge medications.  Relevant Imaging Results:  Relevant Lab Results:   Additional Information SSN: [id number]  Alvaro Louder, LCSW

## 2024-05-08 NOTE — TOC Initial Note (Signed)
 Transition of Care [facility]) - Initial/Assessment Note    Patient Details  Name: Eugene Henderson MRN: [id number] Date of Birth: [birth year]  [facility] CM/SW Contact:    Alvaro Louder, LCSW Phone Number: 05/08/2024, 3:25 PM  Clinical Narrative:     Patient from home PCP is  Duwaine Louder. LCSWA ordered DME 02 for patient. Patient has been faxed out patient to SNF's in [place]. TOC to present facilities to patient at the bedside.  TOC to follow for discharge                   Patient Goals and CMS Choice            Expected Discharge Plan and Services                                              Prior Living Arrangements/Services                       Activities of Daily Living      Permission Sought/Granted                  Emotional Assessment              Admission diagnosis:  A-fib (HCC) [I48.91] SOB (shortness of breath) [R06.02] COPD exacerbation (HCC) [J44.1] Atrial fibrillation with rapid ventricular response (HCC) [I48.91] Acute on chronic congestive heart failure, unspecified heart failure type (HCC) [I50.9] Cough, unspecified type [R05.9] Patient Active Problem List   Diagnosis Date Noted   Acute on chronic congestive heart failure (HCC) 05/05/2024   A-fib (HCC) 05/02/2024   Atrial fibrillation with rapid ventricular response (HCC) 05/01/2024   Postural dizziness with presyncope 04/13/2024   Orthostatic hypotension 04/13/2024   Urinary tract infection 01/04/2024   Abnormal stress test 12/16/2023   NICM (nonischemic cardiomyopathy) (HCC) 06/16/2022   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   Metal foreign body in head 06/14/2022   Pain in joint of right knee 07/27/2021   Post-traumatic osteoarthritis 07/27/2021   Aortic atherosclerosis 08/27/2020   Acute systolic heart failure (HCC)    AF (paroxysmal atrial fibrillation) (HCC)    Hyperlipidemia    Morbid obesity (HCC) 12/11/2018    Chronic pain of right knee 12/11/2018   Advance directive discussed with patient 05/16/2018   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   Keratitis due to infection 07/06/2017   Corneal ulcer 06/24/2017   COPD (chronic obstructive pulmonary disease) (HCC) 02/03/2017   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) 12/15/2016   AAA (abdominal aortic aneurysm) 11/29/2016   Chronic low back pain 11/29/2016   Alcohol abuse 11/29/2016   Hyponatremia 11/22/2016   Chronic systolic heart failure (HCC) 08/19/2016   Tobacco use 08/19/2016   Persistent atrial fibrillation (HCC) 07/28/2016   Hyperthyroidism 07/28/2016   Noncompliance with medications 07/28/2016   Essential hypertension 07/28/2016   PCP:  Louder Duwaine SQUIBB, DO Pharmacy:   [facility] - [place], [place] - [address] [place] [address] Phone: [phone] Fax: [phone]  [id number] - [place], [place] - [address] [place] [address] Phone: [phone] Fax: [phone]  [facility] [address] [place] - [address] [place] [address] Phone: [phone] Fax: [phone]  Social Drivers of Health (SDOH) Social History: SDOH Screenings   Food Insecurity: No Food Insecurity (05/02/2024)  Housing: Low Risk  (05/02/2024)  Transportation Needs: No Transportation Needs (05/02/2024)  Recent Concern: Transportation Needs - Unmet Transportation Needs (03/27/2024)  Utilities: Not At Risk (05/02/2024)  Alcohol Screen: Low Risk  (07/27/2022)  Depression (PHQ2-9): Medium Risk (04/16/2024)  Financial Resource Strain: Low Risk  (03/27/2024)  Physical Activity: Inactive (07/27/2022)  Social Connections: Socially Isolated (05/02/2024)  Stress: No Stress Concern Present (07/27/2022)  Tobacco Use: High Risk (05/01/2024)   SDOH Interventions:     Readmission Risk Interventions    06/16/2022    3:33 PM   Readmission Risk Prevention Plan  Transportation Screening Complete  PCP or Specialist Appt within 3-5 Days --  HRI or Home Care Consult Complete  Social Work Consult for Recovery Care Planning/Counseling Complete  Palliative Care Screening Not Applicable  Medication Review Oceanographer) Complete

## 2024-05-08 NOTE — Progress Notes (Signed)
 Orthopedic surgery plan of care note  After review of the left elbow radiographs, there appears to be a subtle nondisplaced fracture of the lateral aspect of the radial head.  This finding is consistent with his physical exam given the swelling at the elbow and painful range of motion, particularly with pronosupination.  My recommendation is sling immobilization of the left upper extremity for 2 to 3 weeks with daily pendulum exercises to help prevent stiffness.  He will need to be nonweightbearing to the left upper extremity during this time.  He can follow-up with orthopedics on an outpatient basis for further management.  Feel free to reach out with any other questions or concerns.  Jackquline CANDIE Barrack, MD Orthopaedic Surgery [facility]

## 2024-05-08 NOTE — Progress Notes (Signed)
 Physical Therapy Treatment Patient Details Name: Eugene Henderson MRN: [id number] DOB: Nov 25, 1950 Today's Date: 05/08/2024   History of Present Illness presented to ER secondary to progressive SOB; admitted for management of afib with RVR, likely secondary to COPD exacerbation    PT Comments  Pt seen this am continues to express pain in L wrist, elbow, and shoulder. Unable to tolerate wt bearing or against gravity positions due to significant pain. Spica wrist splint donned. L UE supported during mobility and transfers. Poor balance with sit<>stand transfers. Pt using his personal cane to advance a few steps to bedside recliner with +2 assist for safety. High fall risk. Pt positioned to comfort in upright sitting L UE supported on pillows. Ongoing concerns for soft tissue or bony involvement after sitting down hard on L hand/distal arm when he slipped during transfer with nursing a few days ago. Ortho currently followings.   Addendum, 1:38pm: elbow imaging reveals subtle nondisplaced fracture of the lateral aspect of the radial head. Per ortho, pt to be nonweightbearing to LUE, sling immobilization 2-3wks with daily pendulum exercises to help prevent stiffness.   Addendum, 3:50pm: A this time, after discussing with Primary PT, pt is unsafe to return home due to recent L UE findings and will benefit from STR. Will continue to follow.    If plan is discharge home, recommend the following: A lot of help with walking and/or transfers;A lot of help with bathing/dressing/bathroom;Assist for transportation;Help with stairs or ramp for entrance   Can travel by private vehicle     No  Equipment Recommendations  Other (comment) (TBD at next level of care)    Recommendations for Other Services       Precautions / Restrictions Precautions Precautions: Fall Recall of Precautions/Restrictions: Intact Required Braces or Orthoses: Splint/Cast (L wrist Spica splint due to severe arthritis) Splint/Cast:   (L wrist Spica) Restrictions Weight Bearing Restrictions Per Provider Order: No Other Position/Activity Restrictions:  (Pt unable to accept wt through L UE due to pain)     Mobility  Bed Mobility Overal bed mobility: Needs Assistance Bed Mobility: Supine to Sit     Supine to sit: HOB elevated, Used rails, Min assist     General bed mobility comments:  (Increased time to scoot to edge of bed, L UE pain with minimal trunk shifting, tremorous throughout due to pain)    Transfers Overall transfer level: Needs assistance Equipment used: Straight cane Transfers: Sit to/from Stand Sit to Stand: Min assist, From elevated surface, +2 physical assistance, +2 safety/equipment           General transfer comment:  (Pt shuffled a few steps with SPC, very unsteady, unable to use RW due to L UE pain)    Ambulation/Gait               General Gait Details:  (Unable to tolerate gait training due to L UE pain and unsteadiness)   Stairs             Wheelchair Mobility     Tilt Bed    Modified Rankin (Stroke Patients Only)       Balance Overall balance assessment: Needs assistance Sitting-balance support: Feet supported, Single extremity supported Sitting balance-Leahy Scale: Fair Sitting balance - Comments:  (requires R UE support on foot board of bed at times to sit up) Postural control: Right lateral lean Standing balance support: Single extremity supported, Reliant on assistive device for balance Standing balance-Leahy Scale: Poor Standing balance comment:  (High fall risk  due to inability to use L UE)                            Communication Communication Communication: No apparent difficulties  Cognition Arousal: Alert Behavior During Therapy: WFL for tasks assessed/performed   PT - Cognitive impairments: No apparent impairments                         Following commands: Intact      Cueing Cueing Techniques: Verbal cues   Exercises Other Exercises Other Exercises: Pt requiring increased assist for transfers and mobility this date due to new onset of L shoulder pain limiting wt bearing and ROM tolerance    General Comments General comments (skin integrity, edema, etc.):  (HR 130-150's sitting EOB)      Pertinent Vitals/Pain Pain Assessment Pain Assessment: Faces Faces Pain Scale: Hurts whole lot Pain Location: LUE particularly shoulder and wrist with any movement and with dangling Pain Descriptors / Indicators: Aching, Discomfort, Grimacing, Guarding, Shooting Pain Intervention(s): Limited activity within patient's tolerance    Home Living Family/patient expects to be discharged to:: Private residence Living Arrangements: Children Available Help at Discharge: Family;Available PRN/intermittently Type of Home: House Home Access: Stairs to enter Entrance Stairs-Rails: Right;Left;Can reach both Entrance Stairs-Number of Steps: 4   Home Layout: One level Home Equipment: Cane - single point Additional Comments: uses urinal;    Prior Function            PT Goals (current goals can now be found in the care plan section) Acute Rehab PT Goals Patient Stated Goal: to go to the bathroom    Frequency    Min 2X/week      PT Plan      Co-evaluation   Reason for Co-Treatment: Complexity of the patient's impairments (multi-system involvement);For patient/therapist safety;To address functional/ADL transfers PT goals addressed during session: Mobility/safety with mobility;Balance;Proper use of DME OT goals addressed during session: ADL's and self-care      AM-PAC PT 6 Clicks Mobility   Outcome Measure  Help needed turning from your back to your side while in a flat bed without using bedrails?: A Lot Help needed moving from lying on your back to sitting on the side of a flat bed without using bedrails?: A Little Help needed moving to and from a bed to a chair (including a wheelchair)?: A  Lot Help needed standing up from a chair using your arms (e.g., wheelchair or bedside chair)?: A Lot Help needed to walk in hospital room?: A Lot Help needed climbing 3-5 steps with a railing? : A Lot 6 Click Score: 13    End of Session Equipment Utilized During Treatment: Gait belt Activity Tolerance: Patient limited by pain Patient left: in chair;with call bell/phone within reach;with chair alarm set;Other (comment) Nurse Communication: Mobility status;Patient requests pain meds PT Visit Diagnosis: Unsteadiness on feet (R26.81);Muscle weakness (generalized) (M62.81)     Time: 8874-8846 PT Time Calculation (min) (ACUTE ONLY): 28 min  Charges:    $Therapeutic Activity: 8-22 mins PT General Charges $$ ACUTE PT VISIT: 1 Visit                    Darice Bohr, PTA  Darice JAYSON Bohr 05/08/2024, 2:41 PM

## 2024-05-08 NOTE — Progress Notes (Signed)
 PROGRESS NOTE    Eugene Henderson   FMW:[id number] DOB: 02-Jan-1951  DOA: 05/01/2024 Date of Service: 05/08/24 which is hospital day 7  PCP: Louder Duwaine SQUIB[facility], [facility] course / significant events:   Eugene Henderson is a 73 y.o. year old male with medical history of HTN, HLD, T2DM, HFrEF (recent echo with EF 40 %), COPD presenting with worsening shortness of breath.   HPI: Pt states he woke up Tuesday 05/01/24 4am w/ worsening shortness of breath. Inhalers were not helping. He used them multiple times. Called EMS. No fevers or chills but has been coughing with sputum production, some shortness of breath over the last week. He always has dyspnea on exertion limiting him to 20-30 feet but it has worsened significantly over the last few days.    11/25: to ED, Afib RVR, EDP gave him IV Dilt push and restarted his metoprolol  with improvement in his rate.admitted to hospitalist for Afib RVR likely d/t COPD exacerbation (tx steroids, doxycycline , nebs, NOT needing O2), possible CHF exacerbation (IV lasix  and diuresing well w/ this). 11/26: continuing diuresis, out total almost 3L UOP today 11/27: out another 2L UOP yesterday , continuing diuresis 11/28: UOP slowing down some, still very SOB w/ walking. CTA chest no effusion/edema, no PE.  11/29: still significant SOB and tachycardia on minimal exertion. Diuresis to po. Suspect most likely deconditioning / COPD primary cause for SOB symptoms but will ask cardiology to weigh in re: other workup while inpatient --> restart aldactone , agree SOB mostly driven by obesity, COPD, deconditioning. Wrist pain pt reports twisted it while using BSC, wrist splint ordered 11/30: continued wrist pain, XR done no fracture but (+)arthritis at base first metacarpal near trapezium. Thumb spica splint ordered. Ambulating on O2 3L needs this at home 12/01: wrist splint not thumb spica, corrected this. Pt reporting severe pain, ortho consulted, no futher recs at this  time can follow outpatient. Requiring IV pain control will hold on DC for now and PT/OT to reassess for function/safety to plan dispo  12/02: continued arm pain, pt reports worse, ortho got XR elbow (+)small radial head fx, recs for sling. PT/OT reassess and pt qualifies for rehab, TOC working on placement      Consultants:  Cardiology  Orthopedic surgery   Procedures/Surgeries: none      ASSESSMENT & PLAN:   Atrial fibrillation with RVR - resolved  D/t missed his metoprolol  also worsened by him repeatedly using his inhaler for his shortness of breath  EDP gave him IV Dilt push and restarted his metoprolol  with improvement in his rate.  Restarted home Eliquis , digoxin , metoprolol  Telemetry can be dc   COPD exacerbation - improved  prednisone  received 5 days  doxycycline . Received 5 days  Nebulizers prn O2 as needed  Incentive spirometer encouraged    CHF exacerbation - resolved  Echo done 04/26/24 He is not sure of his medications  Lasix  held on last discharge d/t low BP, probably he needed that rx   IV diuretics --> po  Strict I&O GDMT as BP allows, see med rec - metoprolol , entresto , statin, eliquis   Given known reason for exacerbation and initial good response to diuresis, held off on cardiology consult at first but however significant SOB on minimal exertion and pulmonary findings on CT not impressive - consulted cardiology, rengage w/ them as needed   Acute on likely chronic respiratory failure Will need home O2   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   sling immobilization of the left upper extremity for 2 to 3 weeks  daily pendulum exercises to help prevent stiffness. NWB the left upper extremity during this time. Patient can follow-up as an out patient for further discussion and treatment Reach out to ortho as needed  Tobacco use disorder Counseled on cessation   Type 2 diabetes Mellitus well controlled: Monitor CBGs    Hyperlipidemia:  home statin   Deconditioning PT/OT --> home health     Class 2 obesity based on BMI: Body mass index is 36.39 kg/m.SABRA Significantly low or high BMI is associated with higher medical risk.  Underweight - under 18  overweight - 25 to 29 obese - 30 or more Class 1 obesity: BMI of 30.0 to 34 Class 2 obesity: BMI of 35.0 to 39 Class 3 obesity: BMI of 40.0 to 49 Super Morbid Obesity: BMI 50-59 Super-super Morbid Obesity: BMI 60+ Healthy nutrition and physical activity advised as adjunct to other disease management and risk reduction treatments    DVT prophylaxis: eliquis  IV fluids: no continuous IV fluids  Nutrition: cardiac / carb Central lines / other devices: none  Code Status: FULL CODE ACP documentation reviewed:  none on file in [facility]  TOC needs: SNF placement  Medical barriers to dispo: none             Subjective / Brief ROS:  Patient reports L wrist and shoulder pain are ok today, elbow hurts  SOB is better  Denies CP Denies new weakness.  Tolerating diet.  Reports no concerns w/ urination/defecation.   Family Communication: none at this time     Objective Findings:  Vitals:   05/08/24 0420 05/08/24 0500 05/08/24 0729 05/08/24 1550  BP: (!) 134/107  (!) 122/108 113/63  Pulse: 97  99 (!) 57  Resp: 17  16 19   Temp: 98.7 F (37.1 C)  98.9 F (37.2 C) 98.9 F (37.2 C)   TempSrc:   Oral Oral  SpO2: 100%  100% 95%  Weight:  123 kg    Height:        Intake/Output Summary (Last 24 hours) at 05/08/2024 1609 Last data filed at 05/08/2024 0900 Gross per 24 hour  Intake 480 ml  Output 600 ml  Net -120 ml   Filed Weights   05/06/24 0500 05/07/24 0500 05/08/24 0500  Weight: 122.9 kg 121.7 kg 123 kg    Examination:  Physical Exam Constitutional:      General: He is not in acute distress. Cardiovascular:     Rate and Rhythm: Normal rate. Rhythm irregular.  Pulmonary:     Breath sounds: No wheezing or rales.  Musculoskeletal:     Right lower leg: No edema.     Left lower leg: No edema.     Comments: See ortho exam  Pt will not let me move the L shoulder or L wrist today   Skin:    General: Skin is warm and dry.  Neurological:     Mental Status:  He is alert and oriented to person, place, and time.  Psychiatric:        Behavior: Behavior normal.          Scheduled Medications:   apixaban   5 mg Oral BID   atorvastatin   40 mg Oral Daily   digoxin   62.5 mcg Oral Daily   empagliflozin   10 mg Oral Daily   furosemide   40 mg Oral Daily   metoprolol  succinate  50 mg Oral Daily   pantoprazole   40 mg Oral BID   sacubitril -valsartan   1 tablet Oral BID   spironolactone   25 mg Oral Daily    Continuous Infusions:   PRN Medications:  acetaminophen  **OR** acetaminophen , alum & mag hydroxide-simeth, diclofenac Sodium, ipratropium-albuterol , morphine  injection, oxyCODONE , senna-docusate  Antimicrobials from admission:  Anti-infectives (From admission, onward)    Start     Dose/Rate Route Frequency Ordered Stop   05/02/24 0215  doxycycline  (VIBRA -TABS) tablet 100 mg        100 mg Oral Every 12 hours 05/02/24 0214 05/06/24 0836   05/01/24 1600  doxycycline  (VIBRAMYCIN ) 100 mg in sodium chloride  0.9 % 250 mL IVPB        100 mg 125 mL/hr over 120 Minutes Intravenous  Once 05/01/24 1549 05/01/24 1834           Data Reviewed:  I have  personally reviewed the following...  CBC: Recent Labs  Lab 05/02/24 0549  WBC 2.6*  HGB 9.7*  HCT 30.0*  MCV 86.7  PLT 166   Basic Metabolic Panel: Recent Labs  Lab 05/02/24 0549 05/03/24 0340 05/04/24 0431 05/05/24 0502 05/07/24 0342  NA 136 139 135 134* 132*  K 4.0 3.3* 4.0 3.7 3.5  CL 102 102 98 98 95*  CO2 22 26 26 26 26   GLUCOSE 157* 124* 94 107* 120*  BUN 12 16 15 14 10   CREATININE 0.77 0.87 0.77 0.76 0.68  CALCIUM  8.9 8.8* 8.4* 8.8* 8.9   GFR: Estimated Creatinine Clearance: 111.4 mL/min (by C-G formula based on SCr of 0.68 mg/dL). Liver Function Tests: No results for input(s): AST, ALT, ALKPHOS, BILITOT, PROT, ALBUMIN in the last 168 hours. No results for input(s): LIPASE, AMYLASE in the last 168 hours. No results for input(s): AMMONIA in the last 168 hours. Coagulation Profile: No results for input(s): INR, PROTIME in the last 168 hours. Cardiac Enzymes: No results for input(s): CKTOTAL, CKMB, CKMBINDEX, TROPONINI in the last 168 hours. BNP (last 3 results) Recent Labs    05/01/24 1257  PROBNP 5,875.0*   HbA1C: No results for input(s): HGBA1C in the last 72 hours.  CBG: Recent Labs  Lab 05/04/24 1659 05/05/24 1653 05/06/24 0845 05/07/24 0751 05/08/24 0731  GLUCAP 116* 114* 125* 128* 137*   Lipid Profile: No results for input(s): CHOL, HDL, LDLCALC, TRIG, CHOLHDL, LDLDIRECT in the last 72 hours. Thyroid  Function Tests: No results for input(s): TSH, T4TOTAL, FREET4, T3FREE, THYROIDAB in the last 72 hours. Anemia Panel: No results for input(s): VITAMINB12, FOLATE, FERRITIN, TIBC, IRON, RETICCTPCT in the last 72 hours. Most Recent Urinalysis On File:     Component Value Date/Time   COLORURINE YELLOW (A) 04/13/2024 0025   APPEARANCEUR CLOUDY (A) 04/13/2024 0025   APPEARANCEUR Cloudy (A) 11/09/2023 0928   LABSPEC 1.010 04/13/2024 0025   PHURINE 5.0 04/13/2024 0025   GLUCOSEU  >=500 (A) 04/13/2024 0025   HGBUR LARGE (A) 04/13/2024 0025   BILIRUBINUR NEGATIVE 04/13/2024 0025   BILIRUBINUR negative 01/04/2024 1545   BILIRUBINUR Negative 11/09/2023 9071  KETONESUR NEGATIVE 04/13/2024 0025   PROTEINUR NEGATIVE 04/13/2024 0025   UROBILINOGEN 0.2 01/04/2024 1545   NITRITE NEGATIVE 04/13/2024 0025   LEUKOCYTESUR MODERATE (A) 04/13/2024 0025   Sepsis Labs: @LABRCNTIP (procalcitonin:4,lacticidven:4) Microbiology: Recent Results (from the past 240 hours)  Resp panel by RT-PCR (RSV, Flu A&B, Covid) Anterior Nasal Swab     Status: None   Collection Time: 05/01/24  3:56 PM   Specimen: Anterior Nasal Swab  Result Value Ref Range Status   SARS Coronavirus 2 by RT PCR NEGATIVE NEGATIVE Final    Comment: (NOTE) SARS-CoV-2 target nucleic acids are NOT DETECTED.  The SARS-CoV-2 RNA is generally detectable in upper respiratory specimens during the acute phase of infection. The lowest concentration of SARS-CoV-2 viral copies this assay can detect is 138 copies/mL. A negative result does not preclude SARS-Cov-2 infection and should not be used as the sole basis for treatment or other patient management decisions. A negative result may occur with  improper specimen collection/handling, submission of specimen other than nasopharyngeal swab, presence of viral mutation(s) within the areas targeted by this assay, and inadequate number of viral copies(<138 copies/mL). A negative result must be combined with clinical observations, patient history, and epidemiological information. The expected result is Negative.  Fact Sheet for Patients:  bloggercourse.com  Fact Sheet for Healthcare Providers:  seriousbroker.it  This test is no t yet approved or cleared by the [place]  FDA and  has been authorized for detection and/or diagnosis of SARS-CoV-2 by FDA under an Emergency Use Authorization (EUA). This EUA will remain  in  effect (meaning this test can be used) for the duration of the COVID-19 declaration under Section 564(b)(1) of the Act, 21 U.S.C.section 360bbb-3(b)(1), unless the authorization is terminated  or revoked sooner.       Influenza A by PCR NEGATIVE NEGATIVE Final   Influenza B by PCR NEGATIVE NEGATIVE Final    Comment: (NOTE) The Xpert Xpress SARS-CoV-2/FLU/RSV plus assay is intended as an aid in the diagnosis of influenza from Nasopharyngeal swab specimens and should not be used as a sole basis for treatment. Nasal washings and aspirates are unacceptable for Xpert Xpress SARS-CoV-2/FLU/RSV testing.  Fact Sheet for Patients: bloggercourse.com  Fact Sheet for Healthcare Providers: seriousbroker.it  This test is not yet approved or cleared by the [place]  FDA and has been authorized for detection and/or diagnosis of SARS-CoV-2 by FDA under an Emergency Use Authorization (EUA). This EUA will remain in effect (meaning this test can be used) for the duration of the COVID-19 declaration under Section 564(b)(1) of the Act, 21 U.S.C. section 360bbb-3(b)(1), unless the authorization is terminated or revoked.     Resp Syncytial Virus by PCR NEGATIVE NEGATIVE Final    Comment: (NOTE) Fact Sheet for Patients: bloggercourse.com  Fact Sheet for Healthcare Providers: seriousbroker.it  This test is not yet approved or cleared by the [place]  FDA and has been authorized for detection and/or diagnosis of SARS-CoV-2 by FDA under an Emergency Use Authorization (EUA). This EUA will remain in effect (meaning this test can be used) for the duration of the COVID-19 declaration under Section 564(b)(1) of the Act, 21 U.S.C. section 360bbb-3(b)(1), unless the authorization is terminated or revoked.  Performed at [facility], [address] [place], [place] [address]        Radiology Studies last 3 days: DG ELBOW COMPLETE LEFT (3+VIEW) Result Date: 05/08/2024 CLINICAL DATA:  Elbow pain. EXAM: LEFT ELBOW - COMPLETE 3+ VIEW COMPARISON:  None Available. FINDINGS: No appreciable acute  fracture or dislocation. There is an elbow joint effusion. Faint periarticular mineralization at the radiocapitellar joint. Mild soft tissue swelling at the posterior elbow. IMPRESSION: 1. Elbow joint effusion without radiographically evident fracture or dislocation. In the setting of recent trauma, these findings are concerning for radiographically occult fracture. Consider CT of the elbow for further evaluation. 2. Faint periarticular mineralization at the radiocapitellar joint, could reflect chondrocalcinosis or dystrophic capsular/ligamentous calcification. Electronically Signed   By: Harrietta Sherry M.D.   On: 05/08/2024 13:14   DG Wrist Complete Left Result Date: 05/06/2024 CLINICAL DATA:  Left wrist pain EXAM: LEFT WRIST - COMPLETE 4 VIEW COMPARISON:  None Available. FINDINGS: There is no evidence of fracture or dislocation. Amorphous calcification along the radial carpal joint space. Severe degenerative changes of the thumb carpal metacarpal joint with large subchondral lucencies. Soft tissues are unremarkable. IMPRESSION: 1. No acute fracture or dislocation. 2. 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 3. Amorphous calcification along the radiocarpal joint space, which may represent chondrocalcinosis. Electronically Signed   By: Limin  Xu M.D.   On: 05/06/2024 19:03          Conard Alvira, DO [facility] 05/08/2024, 4:09 PM    Dictation software may have been used to generate the above note. Typos may occur and escape review in typed/dictated notes. Please contact Dr Marsa directly for clarity if needed.  Staff may message me via secure chat in Epic  but this may not receive an immediate response,  please page me for urgent  matters!  If 7PM-7AM, please contact night coverage [email or web address]

## 2024-05-08 NOTE — Progress Notes (Signed)
 Subjective: Patient was resting comfortably upon arrival to hospital room.  He reports that his wrist pain is about the same but is complaining more this morning of forearm pain and elbow pain.  He is having difficulty moving the forearm/elbow very much.  He reports that it has been hurting since his injury a few days ago while in the hospital that has been previously described but he does feel that it is getting worse.   Objective: Vital signs in last 24 hours: Temp:  [97.8 F (36.6 C)-98.9 F (37.2 C)] 98.9 F (37.2 C) (12/02 0729) Pulse Rate:  [97-99] 99 (12/02 0729) Resp:  [16-19] 16 (12/02 0729) BP: (122-140)/(75-108) 122/108 (12/02 0729) SpO2:  [99 %-100 %] 100 % (12/02 0729) Weight:  [876 kg] 123 kg (12/02 0500)  Intake/Output from previous day: 12/01 0701 - 12/02 0700 In: 1080 [P.O.:1080] Out: 1040 [Urine:1040] Intake/Output this shift: No intake/output data recorded.  No results for input(s): HGB in the last 72 hours. No results for input(s): WBC, RBC, HCT, PLT in the last 72 hours. Recent Labs    05/07/24 0342  NA 132*  K 3.5  CL 95*  CO2 26  BUN 10  CREATININE 0.68  GLUCOSE 120*  CALCIUM  8.9   No results for input(s): LABPT, INR in the last 72 hours.  Physical Exam: LUE: He has tenderness to palpation throughout the extremity particularly over the medial and lateral aspects of the elbow as well as into the volar and dorsal aspects of the forearm and wrist.  He is able to tolerate about a 30 to 40 degree arc of range of motion of elbow flexion and extension.  He does not tolerate much pronosupination of the forearm.  The elbow does feel swollen and somewhat warm compared to the contralateral elbow.  He is grossly neurovascularly intact  X-rays:  Assessment: Left wrist first [facility] joint arthritis Left elbow pain  Plan: - I have ordered an x-ray of the left elbow to assess for fracture or arthritic changes - Septic arthritis of the elbow is  possible but fairly low in my differential considering that the pain is linked to a trauma and began after a discrete event - Continue with the thumb spica brace to help with the first [facility] joint pain - Pain medication/anti-inflammatories as tolerated per primary - Continue working with OT/PT for range of motion, edema control, pain modalities   Jackquline GORMAN Barrack 05/08/2024, 8:05 AM

## 2024-05-09 DIAGNOSIS — I4891 Unspecified atrial fibrillation: Secondary | ICD-10-CM | POA: Diagnosis not present

## 2024-05-09 LAB — GLUCOSE, CAPILLARY: Glucose-Capillary: 134 mg/dL — ABNORMAL HIGH (ref 70–99)

## 2024-05-09 MED ORDER — POLYETHYLENE GLYCOL 3350 17 G PO PACK
17.0000 g | PACK | Freq: Every day | ORAL | Status: DC
  Administered 2024-05-09 – 2024-05-10 (×2): 17 g via ORAL
  Filled 2024-05-09 (×2): qty 1

## 2024-05-09 NOTE — Progress Notes (Unsigned)
2

## 2024-05-09 NOTE — Progress Notes (Signed)
 Physical Therapy Treatment Patient Details Name: Eugene Henderson MRN: [id number] DOB: 1950-06-15 Today's Date: 05/09/2024   History of Present Illness presented to ER secondary to progressive SOB; admitted for management of afib with RVR, likely secondary to COPD exacerbation    PT Comments  Pt received in bed for co-tx with OT due to increased assist needed after recent injury to L UE when his hand slipped while lowering onto [facility]. Ortho consulted, pt found to have severe degenerative changes of thumb carpometacarpal, Spica Splint ordered. X-ray of L elbow revealed non-displaced radial head fx, pt made NWB in sling. X-Ray of L shoulder 12/3 revealed AC joint arthritis, shoulder impingement, and evidence of a large, chronic rotator cuff tear. Pt however was not having any shoulder pain prior to injury and currently is unable to tolerate any formal testing due to 10/10 pain. Unfortunately this has decreased his independent functional mobility and he is now requiring increased assist for all tasks. PT/OT has requested possible benefits of obtaining additional diagnostic tests to better identify cause of acute pain if symptoms persist or continue to impact mobility.      If plan is discharge home, recommend the following: A lot of help with walking and/or transfers;A lot of help with bathing/dressing/bathroom;Assist for transportation;Help with stairs or ramp for entrance   Can travel by private vehicle     No  Equipment Recommendations  Other (comment) (TBD at next level of care)    Recommendations for Other Services       Precautions / Restrictions Precautions Precautions: Fall Recall of Precautions/Restrictions: Intact Required Braces or Orthoses: Splint/Cast;Sling (L UE Spica splint and arm sling) Splint/Cast:  (L Spica) Restrictions Weight Bearing Restrictions Per Provider Order: Yes LUE Weight Bearing Per Provider Order: Non weight bearing Other Position/Activity Restrictions:  (Left  RCT, Radial head fx, Sling)     Mobility  Bed Mobility Overal bed mobility: Needs Assistance Bed Mobility: Supine to Sit     Supine to sit: HOB elevated, Used rails, Min assist     General bed mobility comments:  (Increased time with HOB raised in order to maneuver with L Sling in place)    Transfers Overall transfer level: Needs assistance Equipment used: Hemi-walker Transfers: Sit to/from Stand Sit to Stand: Min assist, From elevated surface, +2 physical assistance, +2 safety/equipment           General transfer comment:  (Improved ability to stand from surface with support of Hemiwalker)    Ambulation/Gait Ambulation/Gait assistance: Min assist Gait Distance (Feet):  (4) Assistive device: Hemi-walker Gait Pattern/deviations: Step-to pattern, Decreased step length - right, Decreased step length - left, Staggering left, Staggering right, Wide base of support Gait velocity: decreased     General Gait Details:  (Very wide BOS to compensate for single UE supported gait in room. Improved balance using HW instead of SPC.)   Stairs             Wheelchair Mobility     Tilt Bed    Modified Rankin (Stroke Patients Only)       Balance Overall balance assessment: Needs assistance Sitting-balance support: Feet supported, Single extremity supported Sitting balance-Leahy Scale: Fair     Standing balance support: Single extremity supported, During functional activity, Reliant on assistive device for balance Standing balance-Leahy Scale: Poor Standing balance comment:  (Remains a high fall risk due to L UE in sling and unable to utilize for additional balance needs)  Communication Communication Communication: No apparent difficulties  Cognition Arousal: Alert Behavior During Therapy: WFL for tasks assessed/performed   PT - Cognitive impairments: No apparent impairments                       PT - Cognition  Comments: oriented x4 Following commands: Intact      Cueing Cueing Techniques: Verbal cues  Exercises Other Exercises Other Exercises: Pt requiring increased assist for transfers and mobility this date due to new onset of L shoulder pain limiting wt bearing and ROM tolerance    General Comments        Pertinent Vitals/Pain Pain Assessment Pain Assessment: 0-10 Pain Score: 10-Worst pain ever Pain Location: LUE particularly shoulder and wrist with any movement and with dangling Pain Descriptors / Indicators: Aching, Discomfort, Grimacing, Guarding, Shooting Pain Intervention(s): Limited activity within patient's tolerance, Premedicated before session    Home Living                          Prior Function            PT Goals (current goals can now be found in the care plan section) Acute Rehab PT Goals Patient Stated Goal: to get this arm better    Frequency    Min 2X/week      PT Plan      Co-evaluation PT/OT/SLP Co-Evaluation/Treatment: Yes Reason for Co-Treatment: Complexity of the patient's impairments (multi-system involvement);For patient/therapist safety;To address functional/ADL transfers PT goals addressed during session: Mobility/safety with mobility;Balance;Proper use of DME OT goals addressed during session: ADL's and self-care      AM-PAC PT 6 Clicks Mobility   Outcome Measure  Help needed turning from your back to your side while in a flat bed without using bedrails?: A Lot Help needed moving from lying on your back to sitting on the side of a flat bed without using bedrails?: A Little Help needed moving to and from a bed to a chair (including a wheelchair)?: A Lot Help needed standing up from a chair using your arms (e.g., wheelchair or bedside chair)?: A Lot Help needed to walk in hospital room?: A Lot Help needed climbing 3-5 steps with a railing? : A Lot 6 Click Score: 13    End of Session Equipment Utilized During Treatment:  Gait belt Activity Tolerance: Patient limited by pain Patient left: in chair;with call bell/phone within reach;with chair alarm set Nurse Communication: Mobility status PT Visit Diagnosis: Unsteadiness on feet (R26.81);Muscle weakness (generalized) (M62.81)     Time: 8874-8842 PT Time Calculation (min) (ACUTE ONLY): 32 min  Charges:    $Therapeutic Activity: 8-22 mins PT General Charges $$ ACUTE PT VISIT: 1 Visit                    Darice Bohr, PTA  Darice JAYSON Bohr 05/09/2024, 12:51 PM

## 2024-05-09 NOTE — Progress Notes (Addendum)
 Occupational Therapy Treatment Patient Details Name: Eugene Henderson MRN: [id number] DOB: 09/25/1950 Today's Date: 05/09/2024   History of present illness presented to ER secondary to progressive SOB; admitted for management of afib with RVR, likely secondary to COPD exacerbation   OT comments  Upon entering the room, pt supine in bed and agreeable to OT intervention. Supine >sit with min A and use of bed rail. While seated on EOB, pt needing assistance to change soiled hospital gown and L shoulder sling. OT having to educate pt further on not utilizing/lifting UE but does maintain NWB throughout.  Pt stands from EOB with min A of 2 and use of hemi walker. Pt takes several side steps to the L to recliner chair. HR increases to 130-140's with mobility. Pt stands from recliner chair and takes several more steps with min A and hemi walker with chair follow for safety. HR increased to 160's and therefore session terminated and pt seated in recliner chair at that time. Call bell and all needed items within reach. Ortho previously recommended pendulums of L UE but imaging 12/2 PM showing subluxation which contraindicates this recommendation and therefore will not be completed during therapy sessions.         If plan is discharge home, recommend the following:  A lot of help with walking and/or transfers;A lot of help with bathing/dressing/bathroom;Assistance with cooking/housework;Assist for transportation;Help with stairs or ramp for entrance   Equipment Recommendations  Other (comment) (defer)       Precautions / Restrictions Precautions Precautions: Fall Recall of Precautions/Restrictions: Intact Required Braces or Orthoses: Splint/Cast;Sling Splint/Cast: L thumb spica and L arm sling Restrictions Weight Bearing Restrictions Per Provider Order: Yes LUE Weight Bearing Per Provider Order: Non weight bearing       Mobility Bed Mobility Overal bed mobility: Needs Assistance Bed Mobility:  Supine to Sit     Supine to sit: HOB elevated, Used rails, Min assist          Transfers Overall transfer level: Needs assistance Equipment used: Hemi-walker Transfers: Sit to/from Stand Sit to Stand: Min assist, From elevated surface, +2 physical assistance, +2 safety/equipment                 Balance Overall balance assessment: Needs assistance Sitting-balance support: Feet supported, Single extremity supported Sitting balance-Leahy Scale: Fair     Standing balance support: Single extremity supported, During functional activity, Reliant on assistive device for balance Standing balance-Leahy Scale: Poor                             ADL either performed or assessed with clinical judgement   ADL Overall ADL's : Needs assistance/impaired                 Upper Body Dressing : Maximal assistance;Sitting Upper Body Dressing Details (indicate cue type and reason): to doff soiled gown, don clean gown, and don L UE sling                         Communication Communication Communication: No apparent difficulties   Cognition Arousal: Alert Behavior During Therapy: WFL for tasks assessed/performed Cognition: No family/caregiver present to determine baseline                               Following commands: Intact        Cueing  Cueing Techniques: Verbal cues             Pertinent Vitals/ Pain       Pain Assessment Pain Assessment: 0-10 Pain Score: 10-Worst pain ever Pain Location: LUE particularly shoulder and wrist with any movement and with dangling Pain Descriptors / Indicators: Aching, Discomfort, Grimacing, Guarding, Shooting Pain Intervention(s): Limited activity within patient's tolerance, Monitored during session, Premedicated before session, Repositioned         Frequency  Min 2X/week        Progress Toward Goals  OT Goals(current goals can now be found in the care plan section)  Progress towards OT  goals: Progressing toward goals         Co-evaluation    PT/OT/SLP Co-Evaluation/Treatment: Yes Reason for Co-Treatment: Complexity of the patient's impairments (multi-system involvement);For patient/therapist safety;To address functional/ADL transfers PT goals addressed during session: Mobility/safety with mobility;Balance;Proper use of DME OT goals addressed during session: ADL's and self-care      AM-PAC OT 6 Clicks Daily Activity     Outcome Measure   Help from another person eating meals?: A Little Help from another person taking care of personal grooming?: A Little Help from another person toileting, which includes using toliet, bedpan, or urinal?: A Lot Help from another person bathing (including washing, rinsing, drying)?: A Lot Help from another person to put on and taking off regular upper body clothing?: A Lot Help from another person to put on and taking off regular lower body clothing?: Total 6 Click Score: 13    End of Session Equipment Utilized During Treatment: Gait belt;Oxygen  OT Visit Diagnosis: Other abnormalities of gait and mobility (R26.89);Muscle weakness (generalized) (M62.81);Pain Pain - Right/Left: Left Pain - part of body: Shoulder;Arm   Activity Tolerance Patient limited by pain   Patient Left in chair;with call bell/phone within reach;with chair alarm set   Nurse Communication Mobility status        Time: 8876-8846 OT Time Calculation (min): 30 min  Charges: OT General Charges $OT Visit: 1 Visit OT Treatments $Self Care/Home Management : 8-22 mins  Izetta Claude, MS, OTR/L , CBIS ascom [phone]  05/09/24, 3:53 PM

## 2024-05-09 NOTE — Progress Notes (Signed)
 PROGRESS NOTE  Eugene Henderson FMW:[id number] DOB: 03-11-1951 DOA: 05/01/2024 PCP: Louder Duwaine SQUIBB, DO   LOS: 8 days   Brief narrative:  Eugene Henderson is a 73 y.o. year old male with past medical history of hypertension, hyperlipidemia, type 2 diabetes, heart failure with reduced ejection fraction of 40%, COPD presented to hospital with shortness of breath despite using inhalers with cough and sputum production and dyspnea on exertion limiting after 20 to 30 feet.  In the ED, patient was noted to have normal blood pressure.  CBC without leukocytosis but had mildly elevated proBNP.  There was mild metabolic acidosis.  Chest x-ray without any acute findings.  Patient was however noted to be in A-fib with RVR and received Cardizem  and metoprolol  and was considered for admission to the hospital for further evaluation and treatment.   Assessment/Plan: Principal Problem:   Atrial fibrillation with rapid ventricular response (HCC) Active Problems:   Persistent atrial fibrillation (HCC)   Essential hypertension   Tobacco use   COPD (chronic obstructive pulmonary disease) (HCC)   Hyperlipidemia   A-fib (HCC)   Acute on chronic congestive heart failure (HCC)    Atrial fibrillation with RVR - resolved  Likely due to missed metoprolol .  Received IV diltiazem  with some improvement.  Continue Eliquis , digoxin  metoprolol   COPD exacerbation - improved  Status post prednisone  and doxycycline  during hospitalization.  Continue oxygen and incentive spirometer as necessary.  Currently on room air   CHF exacerbation -improved 2D echocardiogram was done on 04/26/2024.  Continue metoprolol , entresto , statin, eliquis .   Acute hypoxic respiratory failure Will need oxygen on discharge  Left first [facility] joint arthritis with exacerbation Orthopedics was consulted and he is on thumb spica.  Continue occupational therapy.  Low concern for septic arthritis.  Nondisplaced fracture of the lateral aspect of the  radial head.   Plan for sling immobilization of the left upper extremity for 2 to 3 weeks.  Nonweightbearing of the left upper extremity.  Follow-up with orthopedic as outpatient. daily pendulum exercises to help prevent stiffness.  Tobacco use disorder Reinforced quitting.   Type 2 diabetes Mellitus well controlled: Will monitor blood glucose levels.   Hyperlipidemia:  Continue statins.   Class 2 obesity  Body mass index is 36.39 kg/m.SABRA  Would benefit from lifestyle modification.  Debility, deconditioning Seen by physical therapy and recommend skilled nursing facility placement.   DVT prophylaxis:  apixaban  (ELIQUIS ) tablet 5 mg   Disposition: Skilled nursing facility as per PT evaluation.  Status is: Inpatient Remains inpatient appropriate because: Need for rehabilitation.    Code Status:     Code Status: Full Code  Family Communication: None at bedside  Consultants: Orthopedics Cardiology  Procedures: None  Anti-infectives:  None  Anti-infectives (From admission, onward)    Start     Dose/Rate Route Frequency Ordered Stop   05/02/24 0215  doxycycline  (VIBRA -TABS) tablet 100 mg        100 mg Oral Every 12 hours 05/02/24 0214 05/06/24 0836   05/01/24 1600  doxycycline  (VIBRAMYCIN ) 100 mg in sodium chloride  0.9 % 250 mL IVPB        100 mg 125 mL/hr over 120 Minutes Intravenous  Once 05/01/24 1549 05/01/24 1834        Subjective: Today, patient was seen and examined at bedside.  Complains of left shoulder pain on sling.  Denies any nausea vomiting shortness of breath dyspnea or chest pain.  Has not had a bowel movement in 2 days.  Has mild  cough.  Objective: Vitals:   05/09/24 0501 05/09/24 0805  BP: 105/77 102/85  Pulse: 99 94  Resp: 18 19  Temp: (!) 97.2 F (36.2 C) 97.8 F (36.6 C)  SpO2: 99% 95%    Intake/Output Summary (Last 24 hours) at 05/09/2024 1133 Last data filed at 05/09/2024 1025 Gross per 24 hour  Intake 320 ml  Output 600 ml   Net -280 ml   Filed Weights   05/07/24 0500 05/08/24 0500 05/09/24 0501  Weight: 121.7 kg 123 kg 123.2 kg   Body mass index is 36.84 kg/m.   Physical Exam: GENERAL: Patient is alert awake and oriented. Not in obvious distress.  Obese build, HENT: No scleral pallor or icterus. Pupils equally reactive to light. Oral mucosa is moist NECK: is supple, no gross swelling noted. CHEST: Clear to auscultation. No crackles or wheezes.  Diminished breath sounds bilaterally. CVS: S1 and S2 heard, no murmur. Regular rate and rhythm.  ABDOMEN: Soft, non-tender, bowel sounds are present. EXTREMITIES: Trace bilateral lower extremity edema.  Left upper extremity in a sling. CNS: Cranial nerves are intact. No focal motor deficits. SKIN: warm and dry without rashes.  Data Review: I have personally reviewed the following laboratory data and studies,  CBC: No results for input(s): WBC, NEUTROABS, HGB, HCT, MCV, PLT in the last 168 hours. Basic Metabolic Panel: Recent Labs  Lab 05/03/24 0340 05/04/24 0431 05/05/24 0502 05/07/24 0342  NA 139 135 134* 132*  K 3.3* 4.0 3.7 3.5  CL 102 98 98 95*  CO2 26 26 26 26   GLUCOSE 124* 94 107* 120*  BUN 16 15 14 10   CREATININE 0.87 0.77 0.76 0.68  CALCIUM  8.8* 8.4* 8.8* 8.9   Liver Function Tests: No results for input(s): AST, ALT, ALKPHOS, BILITOT, PROT, ALBUMIN in the last 168 hours. No results for input(s): LIPASE, AMYLASE in the last 168 hours. No results for input(s): AMMONIA in the last 168 hours. Cardiac Enzymes: No results for input(s): CKTOTAL, CKMB, CKMBINDEX, TROPONINI in the last 168 hours. BNP (last 3 results) Recent Labs    03/06/24 0951  BNP 37.6    ProBNP (last 3 results) Recent Labs    05/01/24 1257  PROBNP 5,875.0*    CBG: Recent Labs  Lab 05/05/24 1653 05/06/24 0845 05/07/24 0751 05/08/24 0731 05/09/24 0832  GLUCAP 114* 125* 128* 137* 134*   Recent Results (from the past 240  hours)  Resp panel by RT-PCR (RSV, Flu A&B, Covid) Anterior Nasal Swab     Status: None   Collection Time: 05/01/24  3:56 PM   Specimen: Anterior Nasal Swab  Result Value Ref Range Status   SARS Coronavirus 2 by RT PCR NEGATIVE NEGATIVE Final    Comment: (NOTE) SARS-CoV-2 target nucleic acids are NOT DETECTED.  The SARS-CoV-2 RNA is generally detectable in upper respiratory specimens during the acute phase of infection. The lowest concentration of SARS-CoV-2 viral copies this assay can detect is 138 copies/mL. A negative result does not preclude SARS-Cov-2 infection and should not be used as the sole basis for treatment or other patient management decisions. A negative result may occur with  improper specimen collection/handling, submission of specimen other than nasopharyngeal swab, presence of viral mutation(s) within the areas targeted by this assay, and inadequate number of viral copies(<138 copies/mL). A negative result must be combined with clinical observations, patient history, and epidemiological information. The expected result is Negative.  Fact Sheet for Patients:  bloggercourse.com  Fact Sheet for Healthcare Providers:  seriousbroker.it  This test is no t yet approved or cleared by the [place]  FDA and  has been authorized for detection and/or diagnosis of SARS-CoV-2 by FDA under an Emergency Use Authorization (EUA). This EUA will remain  in effect (meaning this test can be used) for the duration of the COVID-19 declaration under Section 564(b)(1) of the Act, 21 U.S.C.section 360bbb-3(b)(1), unless the authorization is terminated  or revoked sooner.       Influenza A by PCR NEGATIVE NEGATIVE Final   Influenza B by PCR NEGATIVE NEGATIVE Final    Comment: (NOTE) The Xpert Xpress SARS-CoV-2/FLU/RSV plus assay is intended as an aid in the diagnosis of influenza from Nasopharyngeal swab specimens and should not  be used as a sole basis for treatment. Nasal washings and aspirates are unacceptable for Xpert Xpress SARS-CoV-2/FLU/RSV testing.  Fact Sheet for Patients: bloggercourse.com  Fact Sheet for Healthcare Providers: seriousbroker.it  This test is not yet approved or cleared by the [place]  FDA and has been authorized for detection and/or diagnosis of SARS-CoV-2 by FDA under an Emergency Use Authorization (EUA). This EUA will remain in effect (meaning this test can be used) for the duration of the COVID-19 declaration under Section 564(b)(1) of the Act, 21 U.S.C. section 360bbb-3(b)(1), unless the authorization is terminated or revoked.     Resp Syncytial Virus by PCR NEGATIVE NEGATIVE Final    Comment: (NOTE) Fact Sheet for Patients: bloggercourse.com  Fact Sheet for Healthcare Providers: seriousbroker.it  This test is not yet approved or cleared by the [place]  FDA and has been authorized for detection and/or diagnosis of SARS-CoV-2 by FDA under an Emergency Use Authorization (EUA). This EUA will remain in effect (meaning this test can be used) for the duration of the COVID-19 declaration under Section 564(b)(1) of the Act, 21 U.S.C. section 360bbb-3(b)(1), unless the authorization is terminated or revoked.  Performed at [facility], [address] [place], [place] [address]      Studies: DG Shoulder Left Result Date: 05/08/2024 EXAM: 1 VIEW(S) XRAY OF THE LEFT SHOULDER 05/08/2024 05:41:00 PM COMPARISON: None available. CLINICAL HISTORY: Shoulder pain FINDINGS: BONES AND JOINTS: Superior subluxation of humeral head in relation to glenoid, compatible with chronic rotator cuff tear. Mild degenerative changes of acromioclavicular joint. No acute fracture or dislocation. SOFT TISSUES: Left chest cardiac device in place. No abnormal calcifications. Visualized lung  is unremarkable. IMPRESSION: 1. Superior subluxation of the humeral head relative to the glenoid, compatible with a chronic rotator cuff tear. Electronically signed by: Dorethia Molt MD 05/08/2024 08:16 PM EST RP Workstation: [id number]   DG ELBOW COMPLETE LEFT (3+VIEW) Result Date: 05/08/2024 CLINICAL DATA:  Elbow pain. EXAM: LEFT ELBOW - COMPLETE 3+ VIEW COMPARISON:  None Available. FINDINGS: No appreciable acute fracture or dislocation. There is an elbow joint effusion. Faint periarticular mineralization at the radiocapitellar joint. Mild soft tissue swelling at the posterior elbow. IMPRESSION: 1. Elbow joint effusion without radiographically evident fracture or dislocation. In the setting of recent trauma, these findings are concerning for radiographically occult fracture. Consider CT of the elbow for further evaluation. 2. Faint periarticular mineralization at the radiocapitellar joint, could reflect chondrocalcinosis or dystrophic capsular/ligamentous calcification. Electronically Signed   By: Harrietta Sherry M.D.   On: 05/08/2024 13:14      Vernal Alstrom, MD  [facility] 05/09/2024  If 7PM-7AM, please contact night-coverage

## 2024-05-09 NOTE — TOC Progression Note (Addendum)
 Transition of Care [facility]) - Progression Note    Patient Details  Name: Hasnain Manheim MRN: [id number] Date of Birth: 09-07-1950  Transition of Care [facility]) CM/SW Contact  Alvaro Louder, [place] Phone Number: 05/09/2024, 12:00 PM  Clinical Narrative: 3:30 PM: Auth approved, [facility] will accept patient tomorrow due to bed availability. Approved AuthID: [id number] Dates: 12/3-12/10/2023      LCSWA met with patient and daughter at the bedside. Patient selected SNF [facility].  LCSWA started insurance auth for patient to admit to [facility].   TOC to follow for discharge.                       Expected Discharge Plan and Services                                               Social Drivers of Health (SDOH) Interventions SDOH Screenings   Food Insecurity: No Food Insecurity (05/02/2024)  Housing: Low Risk  (05/02/2024)  Transportation Needs: No Transportation Needs (05/02/2024)  Recent Concern: Transportation Needs - Unmet Transportation Needs (03/27/2024)  Utilities: Not At Risk (05/02/2024)  Alcohol Screen: Low Risk  (07/27/2022)  Depression (PHQ2-9): Medium Risk (04/16/2024)  Financial Resource Strain: Low Risk  (03/27/2024)  Physical Activity: Inactive (07/27/2022)  Social Connections: Socially Isolated (05/02/2024)  Stress: No Stress Concern Present (07/27/2022)  Tobacco Use: High Risk (05/01/2024)    Readmission Risk Interventions    06/16/2022    3:33 PM  Readmission Risk Prevention Plan  Transportation Screening Complete  PCP or Specialist Appt within 3-5 Days --  HRI or Home Care Consult Complete  Social Work Consult for Recovery Care Planning/Counseling Complete  Palliative Care Screening Not Applicable  Medication Review Oceanographer) Complete

## 2024-05-09 NOTE — Plan of Care (Signed)
  Problem: Pain Managment: Goal: General experience of comfort will improve and/or be controlled Outcome: Progressing

## 2024-05-10 DIAGNOSIS — I4891 Unspecified atrial fibrillation: Secondary | ICD-10-CM | POA: Diagnosis not present

## 2024-05-10 LAB — BASIC METABOLIC PANEL WITH GFR
Anion gap: 13 (ref 5–15)
BUN: 18 mg/dL (ref 8–23)
CO2: 23 mmol/L (ref 22–32)
Calcium: 8.8 mg/dL — ABNORMAL LOW (ref 8.9–10.3)
Chloride: 91 mmol/L — ABNORMAL LOW (ref 98–111)
Creatinine, Ser: 0.83 mg/dL (ref 0.61–1.24)
GFR, Estimated: >60 mL/min (ref >60)
Glucose, Bld: 120 mg/dL — ABNORMAL HIGH (ref 70–99)
Potassium: 4.1 mmol/L (ref 3.5–5.1)
Sodium: 127 mmol/L — ABNORMAL LOW (ref 135–145)

## 2024-05-10 LAB — CBC
HCT: 36 % — ABNORMAL LOW (ref 39.0–52.0)
Hemoglobin: 12.2 g/dL — ABNORMAL LOW (ref 13.0–17.0)
MCH: 27.9 pg (ref 26.0–34.0)
MCHC: 33.9 g/dL (ref 30.0–36.0)
MCV: 82.4 fL (ref 80.0–100.0)
Platelets: 188 K/uL (ref 150–400)
RBC: 4.37 MIL/uL (ref 4.22–5.81)
RDW: 15 % (ref 11.5–15.5)
WBC: 7.2 K/uL (ref 4.0–10.5)
nRBC: 0 % (ref 0.0–0.2)

## 2024-05-10 LAB — GLUCOSE, CAPILLARY: Glucose-Capillary: 112 mg/dL — ABNORMAL HIGH (ref 70–99)

## 2024-05-10 MED ORDER — LIDOCAINE 5 % EX PTCH: 1.0000 | MEDICATED_PATCH | CUTANEOUS | Status: AC

## 2024-05-10 MED ORDER — CYCLOBENZAPRINE HCL 5 MG PO TABS: 5.0000 mg | ORAL_TABLET | Freq: Three times a day (TID) | ORAL | Status: AC | PRN

## 2024-05-10 MED ORDER — DICLOFENAC SODIUM 1 % EX GEL: 4.0000 g | Freq: Four times a day (QID) | CUTANEOUS | Status: DC | PRN

## 2024-05-10 MED ORDER — OXYCODONE HCL 10 MG PO TABS: 10.0000 mg | ORAL_TABLET | ORAL | 0 refills | Status: DC | PRN

## 2024-05-10 MED ORDER — SENNOSIDES-DOCUSATE SODIUM 8.6-50 MG PO TABS: 1.0000 | ORAL_TABLET | Freq: Every day | ORAL | Status: AC

## 2024-05-10 MED ORDER — ALUM & MAG HYDROXIDE-SIMETH 200-200-20 MG/5ML PO SUSP: 30.0000 mL | ORAL | Status: AC | PRN

## 2024-05-10 MED ORDER — POLYETHYLENE GLYCOL 3350 17 G PO PACK: 17.0000 g | PACK | Freq: Every day | ORAL | Status: AC | PRN

## 2024-05-10 NOTE — Plan of Care (Signed)
   Problem: Education: Goal: Knowledge of General Education information will improve Description Including pain rating scale, medication(s)/side effects and non-pharmacologic comfort measures Outcome: Progressing

## 2024-05-10 NOTE — TOC Transition Note (Signed)
 Transition of Care [facility]) - Discharge Note   Patient Details  Name: Eugene Henderson MRN: [id number] Date of Birth: 1950/07/13  Transition of Care [facility] CM/SW Contact:  Alvaro Louder, LCSW Phone Number: 05/10/2024, 10:35 AM   Clinical Narrative:   LCSWA received insurance approval for patient to admit to SNF [facility]. LCSWA confirmed with MD that patient is stable for discharge. LCSWA notified the patient and they are in agreement with discharge. LCSWA confirmed bed is available at SNF. Transport arranged with [facility] for next available.  Number to call report: [phone], RM: [phone]   TOC signing off  Final next level of care: Skilled Nursing Facility Barriers to Discharge: No Barriers Identified   Patient Goals and CMS Choice            Discharge Placement              Patient chooses bed at: [facility] Patient to be transferred to facility by: [facility] Name of family member notified: Wendi/Self Patient and family notified of of transfer: 05/10/24  Discharge Plan and Services Additional resources added to the After Visit Summary for                                       Social Drivers of Health (SDOH) Interventions SDOH Screenings   Food Insecurity: No Food Insecurity (05/02/2024)  Housing: Low Risk  (05/02/2024)  Transportation Needs: No Transportation Needs (05/02/2024)  Recent Concern: Transportation Needs - Unmet Transportation Needs (03/27/2024)  Utilities: Not At Risk (05/02/2024)  Alcohol Screen: Low Risk  (07/27/2022)  Depression (PHQ2-9): Medium Risk (04/16/2024)  Financial Resource Strain: Low Risk  (03/27/2024)  Physical Activity: Inactive (07/27/2022)  Social Connections: Socially Isolated (05/02/2024)  Stress: No Stress Concern Present (07/27/2022)  Tobacco Use: High Risk (05/01/2024)     Readmission Risk Interventions    06/16/2022    3:33 PM  Readmission Risk Prevention Plan  Transportation Screening  Complete  PCP or Specialist Appt within 3-5 Days --  HRI or Home Care Consult Complete  Social Work Consult for Recovery Care Planning/Counseling Complete  Palliative Care Screening Not Applicable  Medication Review Oceanographer) Complete

## 2024-05-10 NOTE — Discharge Summary (Signed)
 Physician Discharge Summary  Eugene Henderson FMW:[id number] DOB: September 13, 1950 DOA: 05/01/2024  PCP: Louder Duwaine SQUIBB, DO  Admit date: 05/01/2024 Discharge date: 05/10/2024  Admitted From: Home  Discharge disposition: Skilled nursing facility   Recommendations for Outpatient Follow-Up:   Follow up with your primary care provider at the skilled nursing facility in 3 to 5 days Check CBC, BMP, magnesium  in the next visit Follow-up with orthopedics in 1 week Recommend continuation of sling in the left upper extremity with nonweightbearing.   Discharge Diagnosis:   Principal Problem:   Atrial fibrillation with rapid ventricular response (HCC) Active Problems:   Persistent atrial fibrillation (HCC)   Essential hypertension   Tobacco use   COPD (chronic obstructive pulmonary disease) (HCC)   Hyperlipidemia   A-fib (HCC)   Acute on chronic congestive heart failure (HCC)   Discharge Condition: Improved.  Diet recommendation: Low sodium, heart healthy.    Wound care: None.  Code status: Full.   History of Present Illness:   Eugene Henderson is a 73 y.o. year old male with past medical history of hypertension, hyperlipidemia, type 2 diabetes, heart failure with reduced ejection fraction of 40%, COPD presented to hospital with shortness of breath despite using inhalers with cough and sputum production and dyspnea on exertion limiting after 20 to 30 feet.  In the ED, patient was noted to have normal blood pressure.  CBC without leukocytosis but had mildly elevated proBNP.  There was mild metabolic acidosis.  Chest x-ray without any acute findings.  Patient was however noted to be in A-fib with RVR and received Cardizem  and metoprolol  and was considered for admission to the hospital for further evaluation and treatment.    Hospital Course:   Following conditions were addressed during hospitalization as listed below,  Atrial fibrillation with RVR - resolved  Likely due to missed  metoprolol .  Received IV diltiazem  with some improvement.  Continue Eliquis , digoxin  metoprolol .   COPD exacerbation - improved  Status post prednisone  and doxycycline  during hospitalization.  Continue incentive spirometry..  Currently on room air   CHF exacerbation -improved 2D echocardiogram was done on 04/26/2024.  Continue metoprolol , entresto , statin, eliquis .   Acute hypoxic respiratory failure Improved at this time.  Currently on room air.   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  Continue occupational therapy.  Low concern for septic arthritis.   Nondisplaced fracture of the lateral aspect of the radial head.   Left shoulder pain. Plan for sling immobilization of the left upper extremity for 2 to 3 weeks.  Nonweightbearing of the left upper extremity.  Follow-up with orthopedic as outpatient. daily pendulum exercises to help prevent stiffness.   Tobacco use disorder Reinforced quitting.   Type 2 diabetes Mellitus well controlled: Diet controlled  Hyperlipidemia:  Continue statins.    Class 2 obesity  Body mass index is 36.39 kg/m.SABRA  Would benefit from lifestyle modification.   Debility, deconditioning Seen by physical therapy and recommend skilled nursing facility placement.   Disposition.  At this time, patient is stable for disposition to skilled nursing facility.  Medical Consultants:   Orthopedics Cardiology  Procedures:    Left arm sling application Subjective:   Today, patient was seen and examined today.  Complains of left shoulder pain.  Denies any shortness of breath dyspnea chest pain nausea vomiting fever chills  Discharge Exam:   Vitals:   05/10/24 0432 05/10/24 0826  BP: 104/69 116/86  Pulse: (!) 107  97  Resp: 18 16  Temp: 98.3 F (36.8 C)   SpO2: 94% 99%   Vitals:   05/09/24 1940 05/10/24 0432 05/10/24 0456 05/10/24 0826  BP: 99/69 104/69  116/86  Pulse: 100 (!) 107  97   Resp: 16 18  16   Temp: 97.6 F (36.4 C) 98.3 F (36.8 C)    TempSrc:      SpO2: 90% 94%  99%  Weight:   123.6 kg   Height:       Body mass index is 36.96 kg/m.  General: Alert awake, not in obvious distress, obese built HENT: pupils equally reacting to light,  No scleral pallor or icterus noted. Oral mucosa is moist.  Chest: Diminished breath sounds bilaterally. CVS: S1 &S2 heard. No murmur.  Regular rate and rhythm. Abdomen: Soft, nontender, nondistended.  Bowel sounds are heard.   Extremities: No cyanosis, clubbing with trace lower extremity edema.  Peripheral pulses are palpable.  Left upper extremity in a sling. Psych: Alert, awake and oriented, normal mood CNS:  No cranial nerve deficits.  Moves lower extremities. Skin: Warm and dry.  No rashes noted.  The results of significant diagnostics from this hospitalization (including imaging, microbiology, ancillary and laboratory) are listed below for reference.     Diagnostic Studies:   DG Chest 2 View Result Date: 05/01/2024 CLINICAL DATA:  shob EXAM: DG CHEST 2V COMPARISON:  Nov 03, 2022 FINDINGS: No focal airspace consolidation, pleural effusion, or pneumothorax. Moderate cardiomegaly. Left chest AICD with a single lead terminating in the right ventricle. No acute fracture or destructive lesions. Multilevel thoracic osteophytosis. IMPRESSION: Moderate cardiomegaly.  No pneumonia or pulmonary edema. Electronically Signed   By: Rogelia Myers M.D.   On: 05/01/2024 15:09     Labs:   Basic Metabolic Panel: Recent Labs  Lab 05/04/24 0431 05/05/24 0502 05/07/24 0342 05/10/24 0656  NA 135 134* 132* 127*  K 4.0 3.7 3.5 4.1  CL 98 98 95* 91*  CO2 26 26 26 23   GLUCOSE 94 107* 120* 120*  BUN 15 14 10 18   CREATININE 0.77 0.76 0.68 0.83  CALCIUM  8.4* 8.8* 8.9 8.8*   GFR Estimated Creatinine Clearance: 107.6 mL/min (by C-G formula based on SCr of 0.83 mg/dL). Liver Function Tests: No results for input(s): AST, ALT,  ALKPHOS, BILITOT, PROT, ALBUMIN in the last 168 hours. No results for input(s): LIPASE, AMYLASE in the last 168 hours. No results for input(s): AMMONIA in the last 168 hours. Coagulation profile No results for input(s): INR, PROTIME in the last 168 hours.  CBC: Recent Labs  Lab 05/10/24 0656  WBC 7.2  HGB 12.2*  HCT 36.0*  MCV 82.4  PLT 188   Cardiac Enzymes: No results for input(s): CKTOTAL, CKMB, CKMBINDEX, TROPONINI in the last 168 hours. BNP: Invalid input(s): POCBNP CBG: Recent Labs  Lab 05/06/24 0845 05/07/24 0751 05/08/24 0731 05/09/24 0832 05/10/24 0826  GLUCAP 125* 128* 137* 134* 112*   D-Dimer No results for input(s): DDIMER in the last 72 hours. Hgb A1c No results for input(s): HGBA1C in the last 72 hours. Lipid Profile No results for input(s): CHOL, HDL, LDLCALC, TRIG, CHOLHDL, LDLDIRECT in the last 72 hours. Thyroid  function studies No results for input(s): TSH, T4TOTAL, T3FREE, THYROIDAB in the last 72 hours.  Invalid input(s): FREET3 Anemia work up No results for input(s): VITAMINB12, FOLATE, FERRITIN, TIBC, IRON, RETICCTPCT in the last 72 hours. Microbiology Recent Results (from the past 240 hours)  Resp panel by RT-PCR (RSV, Flu A&B,  Covid) Anterior Nasal Swab     Status: None   Collection Time: 05/01/24  3:56 PM   Specimen: Anterior Nasal Swab  Result Value Ref Range Status   SARS Coronavirus 2 by RT PCR NEGATIVE NEGATIVE Final    Comment: (NOTE) SARS-CoV-2 target nucleic acids are NOT DETECTED.  The SARS-CoV-2 RNA is generally detectable in upper respiratory specimens during the acute phase of infection. The lowest concentration of SARS-CoV-2 viral copies this assay can detect is 138 copies/mL. A negative result does not preclude SARS-Cov-2 infection and should not be used as the sole basis for treatment or other patient management decisions. A negative result may occur with   improper specimen collection/handling, submission of specimen other than nasopharyngeal swab, presence of viral mutation(s) within the areas targeted by this assay, and inadequate number of viral copies(<138 copies/mL). A negative result must be combined with clinical observations, patient history, and epidemiological information. The expected result is Negative.  Fact Sheet for Patients:  bloggercourse.com  Fact Sheet for Healthcare Providers:  seriousbroker.it  This test is no t yet approved or cleared by the [place]  FDA and  has been authorized for detection and/or diagnosis of SARS-CoV-2 by FDA under an Emergency Use Authorization (EUA). This EUA will remain  in effect (meaning this test can be used) for the duration of the COVID-19 declaration under Section 564(b)(1) of the Act, 21 U.S.C.section 360bbb-3(b)(1), unless the authorization is terminated  or revoked sooner.       Influenza A by PCR NEGATIVE NEGATIVE Final   Influenza B by PCR NEGATIVE NEGATIVE Final    Comment: (NOTE) The Xpert Xpress SARS-CoV-2/FLU/RSV plus assay is intended as an aid in the diagnosis of influenza from Nasopharyngeal swab specimens and should not be used as a sole basis for treatment. Nasal washings and aspirates are unacceptable for Xpert Xpress SARS-CoV-2/FLU/RSV testing.  Fact Sheet for Patients: bloggercourse.com  Fact Sheet for Healthcare Providers: seriousbroker.it  This test is not yet approved or cleared by the [place]  FDA and has been authorized for detection and/or diagnosis of SARS-CoV-2 by FDA under an Emergency Use Authorization (EUA). This EUA will remain in effect (meaning this test can be used) for the duration of the COVID-19 declaration under Section 564(b)(1) of the Act, 21 U.S.C. section 360bbb-3(b)(1), unless the authorization is terminated or revoked.      Resp Syncytial Virus by PCR NEGATIVE NEGATIVE Final    Comment: (NOTE) Fact Sheet for Patients: bloggercourse.com  Fact Sheet for Healthcare Providers: seriousbroker.it  This test is not yet approved or cleared by the [place]  FDA and has been authorized for detection and/or diagnosis of SARS-CoV-2 by FDA under an Emergency Use Authorization (EUA). This EUA will remain in effect (meaning this test can be used) for the duration of the COVID-19 declaration under Section 564(b)(1) of the Act, 21 U.S.C. section 360bbb-3(b)(1), unless the authorization is terminated or revoked.  Performed at [facility] [address], [place], [place] [address]      Discharge Instructions:   Discharge Instructions     Call MD for:  severe uncontrolled pain   Complete by: As directed    Diet - low sodium heart healthy   Complete by: As directed    Discharge instructions   Complete by: As directed    Follow-up with your primary care provider at the skilled nursing facility in 3 to 5 days.  Follow-up with orthopedics Dr. Ezra Rattler as outpatient in 1 to 2 weeks for left  wrist and shoulder follow-up.  Continue to use sling for immobilization.  Nonweightbearing of the left upper extremity.  Seek medical attention for worsening symptoms.   Increase activity slowly   Complete by: As directed       Allergies as of 05/10/2024   No Known Allergies      Medication List     STOP taking these medications    nitroGLYCERIN  0.4 MG SL tablet Commonly known as: NITROSTAT    traMADol  50 MG tablet Commonly known as: ULTRAM        TAKE these medications    acetaminophen  500 MG tablet Commonly known as: TYLENOL  Take 1-2 tablets (500-1,000 mg total) by mouth every 6 (six) hours as needed for mild pain (pain score 1-3), fever or headache. What changed:  how much to take when to take this   albuterol  108 (90 Base) MCG/ACT  inhaler Commonly known as: VENTOLIN  HFA Inhale 1-2 puffs into the lungs every 6 (six) hours as needed for wheezing or shortness of breath.   alum & mag hydroxide-simeth 200-200-20 MG/5ML suspension Commonly known as: MAALOX/MYLANTA Take 30 mLs by mouth every 4 (four) hours as needed for indigestion.   atorvastatin  40 MG tablet Commonly known as: LIPITOR TAKE 1 TABLET EVERY DAY   Blood Glucose Monitoring Suppl Devi 1 each by Does not apply route as directed. Dispense based on patient and insurance preference. Use up to four times daily as directed. (FOR ICD-10 E10.9, E11.9).   BLOOD GLUCOSE TEST STRIPS Strp 1 each by Does not apply route as directed. Dispense based on patient and insurance preference. Use up to four times daily as directed. (FOR ICD-10 E10.9, E11.9).   Blood Pressure Cuff Misc Check blood pressure as instructed by your physician   cyclobenzaprine  5 MG tablet Commonly known as: FLEXERIL  Take 1 tablet (5 mg total) by mouth 3 (three) times daily as needed for muscle spasms.   diclofenac Sodium 1 % Gel Commonly known as: VOLTAREN Apply 4 g topically 4 (four) times daily as needed (Apply to L wrist, L shoulder as desired for pain).   digoxin  0.125 MG tablet Commonly known as: LANOXIN  TAKE 1/2 TABLET BY MOUTH EVERY DAY   Eliquis  5 MG Tabs tablet Generic drug: apixaban  TAKE 1 TABLET TWICE DAILY   folic acid  1 MG tablet Commonly known as: FOLVITE  Take 1 tablet (1 mg total) by mouth daily.   furosemide  40 MG tablet Commonly known as: LASIX  Take 1 tablet (40 mg total) by mouth daily.   Jardiance  10 MG Tabs tablet Generic drug: empagliflozin  TAKE 1 TABLET EVERY DAY   Lancet Device Misc 1 each by Does not apply route as directed. Dispense based on patient and insurance preference. Use up to four times daily as directed. (FOR ICD-10 E10.9, E11.9).   Lancets Misc 1 each by Does not apply route as directed. Dispense based on patient and insurance preference. Use  up to four times daily as directed. (FOR ICD-10 E10.9, E11.9).   lidocaine  5 % Commonly known as: Lidoderm  Place 1 patch onto the skin daily for 10 days. Remove & Discard patch within 12 hours or as directed by MD. Apply left shoulder   metFORMIN  500 MG tablet Commonly known as: GLUCOPHAGE  TAKE 1 TABLET TWICE DAILY WITH MEALS   metoprolol  succinate 50 MG 24 hr tablet Commonly known as: TOPROL -XL Take 1 tablet (50 mg total) by mouth daily. Reduced from 150 mg.   Oxycodone  HCl 10 MG Tabs Take 1 tablet (10 mg total) by  mouth every 4 (four) hours as needed for moderate pain (pain score 4-6), severe pain (pain score 7-10) or breakthrough pain.   pantoprazole  40 MG tablet Commonly known as: PROTONIX  Take 1 tablet (40 mg total) by mouth 2 (two) times daily.   polyethylene glycol 17 g packet Commonly known as: MIRALAX  / GLYCOLAX  Take 17 g by mouth daily as needed for mild constipation or moderate constipation (not helped with stool softner).   potassium chloride  SA 20 MEQ tablet Commonly known as: KLOR-CON  M TAKE 1 TABLET BY MOUTH EVERY DAY   Rybelsus  7 MG Tabs Generic drug: Semaglutide  Take 1 tablet (7 mg total) by mouth daily.   sacubitril -valsartan  49-51 MG Commonly known as: ENTRESTO  Take 1 tablet by mouth 2 (two) times daily.   senna-docusate 8.6-50 MG tablet Commonly known as: Senokot-S Take 1 tablet by mouth at bedtime.   spironolactone  25 MG tablet Commonly known as: ALDACTONE  Take 1 tablet (25 mg total) by mouth daily.   Stiolto Respimat  2.5-2.5 MCG/ACT Aers Generic drug: Tiotropium Bromide-Olodaterol INHALE 2 PUFFS BY MOUTH INTO THE LUNGS DAILY               Durable Medical Equipment  (From admission, onward)           Start     Ordered   05/06/24 1737  For home use only DME oxygen  Once       Question Answer Comment  Length of Need Lifetime   Mode or (Route) Nasal cannula   Liters per Minute 3   Oxygen delivery system: Gas      05/06/24 1736             Contact information for follow-up providers     Ezra Jackquline RAMAN, MD Follow up in 1 week(s).   Specialty: Orthopedic Surgery Why: shoulder and wrist followup Contact information: [address] [place] [address] [phone]              Contact information for after-discharge care     Destination     [facility] .   Service: Skilled Nursing Contact information: [address] [place]  [phone]             Home Medical Care     [facility]) .   Service: Home Health Services Contact information: [address] [place]  [address] [phone]                      Time coordinating discharge: 39 minutes  Signed:  Camdyn Beske  [facility] 05/10/2024, 9:53 AM

## 2024-05-10 NOTE — Progress Notes (Signed)
 Report call and given to [facility]- Nurse at [facility]

## 2024-05-10 NOTE — Progress Notes (Signed)
 L UE sling adjusted to fit properly, may benefit from a more supportive sling. Hemi-walker utilized for improved balance while transferring to bedside chair. Remains a high fall risk due to recent NWB L UE due to Radial head fx and RTC. Pt potentially d/c'ing to STR today. Will continue per POC.   05/10/24 1400  PT Visit Information  Assistance Needed +2 (for progressive mobility)  History of Present Illness presented to ER secondary to progressive SOB; admitted for management of afib with RVR, likely secondary to COPD exacerbation  Subjective Data  Patient Stated Goal to get this arm better  Precautions  Precautions Fall  Recall of Precautions/Restrictions Intact  Required Braces or Orthoses Splint/Cast;Sling  Splint/Cast L thumb spica and L arm sling  Restrictions  Weight Bearing Restrictions Per Provider Order Yes  LUE Weight Bearing Per Provider Order NWB  Other Position/Activity Restrictions Left wrist spica splint after x-ray revealed severe arthritis. Pt also unable to weightbear through any part of his LUE, LUE very pain limited. Pt now with nondisplaced radial head fracture. Per ortho: pt to be nonweightbearing to LUE, sling immobilization 2-3wks with daily pendulum exercises to help prevent stiffness.  Pain Assessment  Pain Assessment 0-10  Faces Pain Scale 6  Pain Location LUE particularly shoulder and wrist with any movement and with dangling  Pain Descriptors / Indicators Aching;Discomfort;Grimacing;Guarding;Shooting  Pain Intervention(s) Limited activity within patient's tolerance  Cognition  Arousal Alert  Behavior During Therapy WFL for tasks assessed/performed  PT - Cognitive impairments No apparent impairments  PT - Cognition Comments oriented x4  Following Commands  Following commands Intact  Cueing  Cueing Techniques Verbal cues  Communication  Communication No apparent difficulties  Bed Mobility  Overal bed mobility Needs Assistance  Bed Mobility Supine to Sit   Supine to sit HOB elevated;Used rails;Min assist  Sit to supine Modified independent (Device/Increase time)  Transfers  Overall transfer level Needs assistance  Equipment used Hemi-walker  Transfers Sit to/from Stand  Sit to Stand Min assist;From elevated surface;+2 physical assistance;+2 safety/equipment  Ambulation/Gait  Ambulation/Gait assistance Min assist  Gait Distance (Feet) 5 Feet  Assistive device Hemi-walker  Gait Pattern/deviations Step-to pattern;Decreased step length - right;Decreased step length - left;Staggering left;Staggering right;Wide base of support  General Gait Details broad BOS with slightly choppy stepping pattern; mild forward trunk flexion, intermittently reaching for walls/furniture for external stabilization (SPC in R hand).  Mod SOB with exertion; HR peak to 159 with longer-distance gait, quickly recovering to low-100s with seated rest  Balance  Overall balance assessment Needs assistance  Sitting-balance support Feet supported;Single extremity supported  Sitting balance-Leahy Scale Fair  Standing balance support Single extremity supported;During functional activity;Reliant on assistive device for balance  Standing balance-Leahy Scale Poor  General Comments  General comments (skin integrity, edema, etc.)  (Ongoing L shoulder pain)  Other Exercises  Other Exercises Pt requiring increased assist for transfers and mobility this date due to new onset of L shoulder pain limiting wt bearing and ROM tolerance  PT - End of Session  Equipment Utilized During Treatment Gait belt  Activity Tolerance Patient limited by pain  Patient left in chair;with call bell/phone within reach;with chair alarm set  Nurse Communication Mobility status   PT - Assessment/Plan  PT Visit Diagnosis Unsteadiness on feet (R26.81);Muscle weakness (generalized) (M62.81)  PT Frequency (ACUTE ONLY) Min 2X/week  Follow Up Recommendations Skilled nursing-short term rehab (<3 hours/day)  Can  patient physically be transported by private vehicle No  Patient can return home with  the following A lot of help with walking and/or transfers;A lot of help with bathing/dressing/bathroom;Assist for transportation;Help with stairs or ramp for entrance  PT equipment Other (comment)  AM-PAC PT 6 Clicks Mobility Outcome Measure (Version 2)  Help needed turning from your back to your side while in a flat bed without using bedrails? 2  Help needed moving from lying on your back to sitting on the side of a flat bed without using bedrails? 3  Help needed moving to and from a bed to a chair (including a wheelchair)? 2  Help needed standing up from a chair using your arms (e.g., wheelchair or bedside chair)? 2  Help needed to walk in hospital room? 2  Help needed climbing 3-5 steps with a railing?  2  6 Click Score 13  Consider Recommendation of Discharge To: CIR/SNF/LTACH  Progressive Mobility  What is the highest level of mobility based on the mobility assessment? Level 3 (Stands with assistance) - Balance while standing  and cannot march in place  Activity Stood at bedside  PT Time Calculation  PT Start Time (ACUTE ONLY) 0935  PT Stop Time (ACUTE ONLY) 0959  PT Time Calculation (min) (ACUTE ONLY) 24 min  PT General Charges  $$ ACUTE PT VISIT 1 Visit  PT Treatments  $Gait Training 8-22 mins  $Therapeutic Activity 8-22 mins  Darice Bohr, PTA

## 2024-05-24 NOTE — Progress Notes (Signed)
 [facility] Chief Complaint:   Chief Complaint  Patient presents with   Left Shoulder - Pain   Left Elbow - Pain    History of Present Illness:    Eugene Henderson is a 73 y.o. male that presents to clinic today for evaluation and management of left arm pain.    At the time of this visit, I reviewed his recent hospital discharge note where he came in for admission due to A-fib RVR.  During his stay, he was complaining of left arm pain found to have left shoulder arthritis, nondisplaced left radial head fracture, left thumb CMC joint arthritis.  He was placed in a sling and thumb spica wrist brace, recommended continuing outpatient occupational therapy, then recommended follow-up with orthopedics as an outpatient.  His most recent available labs from 12//2025 show creatinine 0.83, decreased sodium and chloride, decreased hemoglobin.  He had left wrist x-ray at the hospital on 05/06/2024 which reported severe degenerative changes to the thumb CMC joint, calcification of the radiocarpal joint suspected represent chondrocalcinosis.  He had left elbow x-ray on 05/08/2024 that showed mild soft tissue swelling of the posterior elbow and joint effusion which could represent radiographically occult fracture.  He had left shoulder x-ray on 05/08/2024 that reported chronic rotator cuff tear arthropathy change.  The patient reports that his symptoms began after a fall while in the hospital.  He reports that he was trying to sit on the commode and when he fell over to the left and hit his belly and shoulder against the wall.  X-ray showed suspicion for radiographically occult radial head fracture and rotator cuff tear arthropathy to the left shoulder which is chronic.  He has since been discharged to rehab at [facility].  He has been using a thumb spica wrist brace, sling, topical pain creams, acetaminophen , home exercises mostly for his condition.  Today, he does note  left shoulder, left elbow, left wrist pain.  His symptoms are worse with moving his arm.  He currently rates his pain severity as 10/10 he moves.  He has mild pain at rest.  He reports associated swelling around the elbow.  He denies associated locking/catching, instability, numbness or tingling, weakness, fevers or chills, night sweats, weight loss, skin color change, pain at night.   He is right hand dominant and retired as a estate agent.  He feels significant limitation to his function.  Medications, Past Medical/Surgical/Family/Social History:   Current Outpatient Medications  Medication Sig Dispense Refill   acetaminophen  (TYLENOL ) 500 MG tablet Take 500-1,000 mg by mouth     albuterol  MDI, PROVENTIL , VENTOLIN , PROAIR , HFA 90 mcg/actuation inhaler Inhale 1-2 inhalations into the lungs every 6 (six) hours as needed for Wheezing or Shortness of Breath     aluminum-magnesium  hydroxide-simethicone , DRAH, (MI-ACID) 200-200-20 mg/5 mL suspension Take 30 mLs by mouth     AMIOdarone  (PACERONE ) 200 MG tablet Take 200 mg by mouth once daily     amLODIPine  (NORVASC ) 5 MG tablet Take 5 mg by mouth once daily     apixaban  (ELIQUIS ) 5 mg tablet Take 5 mg by mouth 2 (two) times daily     atorvastatin  (LIPITOR) 40 MG tablet Take 40 mg by mouth once daily     blood-glucose meter Misc 1 each     cephalexin  (KEFLEX ) 250 MG capsule take 1 capsule (250 mg total) by mouth 4 times a day for 5 days     ciprofloxacin  HCl (CIPRO ) 500 MG  tablet Take 500 mg by mouth 2 (two) times daily     cyclobenzaprine  (FLEXERIL ) 5 MG tablet Take 5 mg by mouth 3 (three) times daily as needed for Muscle spasms     diclofenac  (VOLTAREN ) 1 % topical gel Apply 4 g topically     digoxin  (LANOXIN ) 0.125 MG tablet TAKE 1 TABLET BY MOUTH DAILY     folic acid  (FOLVITE ) 1 MG tablet Take 1 mg by mouth once daily     FUROsemide  (LASIX ) 40 MG tablet Take 40 mg by mouth once daily     ibuprofen  (MOTRIN ) 200 MG tablet       irbesartan  (AVAPRO ) 150 MG tablet Take 1 tablet by mouth once daily     JARDIANCE  10 mg tablet Take 10 mg by mouth once daily     losartan  (COZAAR ) 25 MG tablet TAKE 1/2 TABLETS (12.5 MG TOTAL) BY MOUTH DAILY.     metFORMIN  (GLUCOPHAGE -XR) 500 MG XR tablet Take by mouth     methIMAzole  (TAPAZOLE ) 10 MG tablet Take 20 mg by mouth 2 (two) times daily     metoprolol  SUCCinate (TOPROL -XL) 100 MG XL tablet as directed     metoprolol  SUCCinate (TOPROL -XL) 50 MG XL tablet Take 50 mg by mouth     miscellaneous medical supply Misc Check blood pressure as instructed by your physician     nitrofurantoin , macrocrystal-monohydrate, (MACROBID ) 100 MG capsule      nitroGLYcerin  (NITROSTAT ) 0.4 MG SL tablet as directed     oxyCODONE  (OXYIR) 10 mg immediate release tablet Take 10 mg by mouth     pantoprazole  (PROTONIX ) 40 MG DR tablet Take 40 mg by mouth 2 (two) times daily     polyethylene glycol (MIRALAX ) packet Take 17 g by mouth     potassium chloride  (KLOR-CON ) 20 MEQ ER tablet Take by mouth     prednisoLONE  acetate (PRED FORTE ) 1 % ophthalmic suspension APPLY 1 DROP INTO RIGHT EYE FOUR TIMES A DAY SHAKE BOTTLE BEFORE EACH USE.     predniSONE  (DELTASONE ) 50 MG tablet Take 50 mg by mouth daily with breakfast     RELION ULTIMA test strip 1 each     RYBELSUS  7 mg tablet Take 7 mg by mouth     sacubitriL -valsartan  (ENTRESTO ) 49-51 mg tablet Take 1 tablet by mouth 2 (two) times daily     sacubitriL -valsartan  (ENTRESTO ) 97-103 mg tablet      sennosides-docusate (SENOKOT-S) 8.6-50 mg tablet Take 1 tablet by mouth at bedtime     spironolactone  (ALDACTONE ) 25 MG tablet Take by mouth     STIOLTO RESPIMAT  2.5-2.5 mcg/actuation inhaler INHALE 2 PUFFS BY MOUTH INTO THE LUNGS DAILY     SYMBICORT  160-4.5 mcg/actuation inhaler TAKE 2 PUFFS BY MOUTH TWICE A DAY     TRUEDRAW LANCING DEVICE Misc      TRUEPLUS LANCETS      valACYclovir  (VALTREX ) 1000 MG tablet TAKE 1 TABLET BY MOUTH 3 TIMES A  DAY FOR 10 DAYS AFTER 10 DAYS BEGIN TAKING 1 TABLET EVERY EVENING     No current facility-administered medications for this visit.    SECONDARY CONDITIONS THAT INFLUENCE TREATMENT AND DECISION-MAKING:  Current everyday smoker  Past Medical History: Obesity, CHF status post ICD placement, atrial fibrillation on Eliquis , hyperlipidemia on statin, hypertension, valvular heart disease, COPD on oxygen, type 2 diabetes not on insulin , history of alcohol abuse, history of chronic hepatitis C, hypothyroid  Past Surgical History: AICD with single lead  Relevant Orthopedic Family History: None  Review of Systems:   A 10+ ROS was performed, reviewed, and the pertinent orthopaedic findings are documented in the HPI.    Physical Examination:   BP 120/70   Ht 185.4 cm (6' 1)   Wt (!) 127 kg (280 lb)   BMI 36.94 kg/m  General/Constitutional: Well-nourished, well developed, no apparent distress. Psych: Normal mood and affect.  Conversant.  Judgement intact. Lymphatic: No palpable adenopathy. Respiratory: Normal respirations, no retractions.  Cardiovascular: No edema or swelling. Musculoskeletal: Shoulder Exam: Inspection   Left  Skin Normal appearance with no obvious deformity.  No ecchymosis or erythema.   Tenderness                                         Left  + biceps  No bony TTP.  No AC, lateral shoulder, pec minor, upper trap, or periscapular TTP.    ROM   Left  Active (Passive) Forward Elevation  Limited to 50 degrees with pain (130 with pain)  ER Limited to 30 degrees with pain (60 with pain)  IR SI  90 degree abduction ER/IR Limited arc of motion  Crepitus Absent        Rotator Cuff   Left  Full Can 2/5 with pain  ER Strength at the side 2/5 with pain   Comprehensive Elbow Exam: Inspection  Left  Skin Normal appearance with no obvious deformity.  No ecchymosis or erythema.  Soft Tissue + mild focal soft tissue swelling over the lateral elbow   Palpation   Left   Tenderness + radial head  No medial/lateral epicondyle, triceps, biceps pain  Crepitus None  Effusion + trace joint effusion   Range of Motion  Left  Flexion  Limited AROM to 120 degrees with pain  Extension  Lacking 30 degrees of extension   Pronation Full to 90 degrees with pain  Supination Limited to 70 degrees with pain  Wrist Flexion Full to 70 degrees with pain  Wrist Extension Full to 70 degrees with pain   Strength  Left  Flexion  GWW  Extension  GWW  Pronation GWW  Supination GWW  Wrist Flexion 5/5  Wrist Extension 5/5   Neurovascular   Left  Distal Motor Normal  Distal Sensory Normal light touch sensation  Distal Pulses Normal   LEFT WRIST = No skin changes.  Mild soft tissue swelling over the dorsal wrist.  Some squaring of the thumb CMC joint.  Tenderness palpation at the thumb [facility] joint and dorsal wrist.  Mild, diffuse hand atrophy.  Tests Reviewed/Performed/Ordered:  EXAM: Left Elbow Radiographs - 3 views (AP, Lateral, Oblique) performed 05/24/2024   CLINICAL INFORMATION: Left elbow pain after a fall; possible radiographically occult fracture   COMPARISON: Left Elbow Radiographs - 4 views (AP, Lateral, Oblique x2) performed 05/08/2024 at [facility]   FINDINGS:   Normal elbow alignment.  Improvement to elbow joint effusion but still appearance of anterior joint effusion.  Small calcification and step-off to the radial head likely represents nondisplaced fracture.  No dislocations.  No soft tissue swelling.  Well maintained joint spaces without evidence of degenerative joint disease.  No loose bodies.  No abnormal bone lesions.  Mild enthesopathic changes to the olecranon.  -------------------------------------------------------------------------------------------------------------- EXAM: Left Shoulder Radiographs - 3 views (AP IR/ER, Scapular Y) performed 05/08/2024 at [facility]   FINDINGS:   Elevation of the humeral head within the glenoid representing chronic  rotator cuff tear arthropathy.  Mild-moderate glenohumeral joint arthritis changes well with joint space narrowing, osteophyte formation, subchondral sclerosis.  Type III acromion with large osteophyte.  No fractures or dislocations.  No soft tissue swelling or joint effusion.  No destructive bony lesion is identified.  --------------------------------------------------------------------------------------------------------------- EXAM: Left Wrist Radiographs - 4 views (PA, Lateral, Oblique, Scaphoid) performed 05/06/2024 at [facility]   FINDINGS:   Severe thumb CMC joint arthritis with joint space narrowing, osteophyte formation, subchondral sclerosis, subchondral cystic change.  Mild radiocarpal arthritis change with joint space narrowing, osteophyte formation, chondrocalcinosis.  Questionable dorsal wrist effusion.  No fractures or dislocations.  No soft tissue swelling.  No loose bodies.  No abnormal bone lesions.  I personally reviewed and visualized the imaging studies if available. I additionally personally interpreted any radiographs taken during today's visit.  Assessment:     ICD-10-CM  1. Fall, initial encounter  W19.XXXA  2. Left elbow pain  M25.522  3. Closed nondisplaced fracture of head of left radius, initial encounter  S52.125A  4. Chronic left shoulder pain  M25.512   G89.29  5. Rotator cuff tear arthropathy of left shoulder  M75.102   M12.812  6. Left wrist pain  M25.532  7. Arthritis of carpometacarpal (CMC) joint of left thumb  M18.12  8. 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 Chondrocalcinosis of left wrist  M11.232    Plan:   I have discussed the nature of his current subjective complaints, clinical examination, test results and have reviewed treatment options.  The plan is to do the following;  - The patient has multiple issues addressed today:  LEFT SHOULDER PAIN = He has previous shoulder x-ray showing chronic rotator cuff tear arthropathy.  No  evidence of fracture from his fall.  I discussed that he could also have soft tissue contusion versus bursitis.  He has limited range of motion and strength of the shoulder which may be chronic due to his x-ray changes.  I discussed the nonsurgical treatment of this with activity modification, home exercise, occupational therapy, medication, topical pain cream, steroid injection.  For now, he will continue with sling, topical pain cream, acetaminophen , home exercise.  LEFT ELBOW PAIN = He had a fall and x-ray showing elbow effusion without obvious fracture.  He still had decreased range of motion, swelling, tenderness to the elbow.  Repeat x-ray today appears to show nondisplaced radial head fracture and improved effusion.  I discussed the nonsurgical treatment of this injury with sling immobilization but performing range of motion exercises as pain allows, ice/compression, topical pain cream, medications, occupational therapy.  He will continue with these treatments and follow-up for reevaluation.  LEFT WRIST/HAND PAIN = He has chronic left wrist and thumb pains.  Recent x-ray showed severe thumb CMC joint as well as mild wrist joint arthritis.  I discussed the conservative treatment of this with activity modification, bracing, topical pain cream.  The patient was prescribed and provided a Wrist & Thumb Brace that was applied in the office today for the diagnosis listed above under assessment. The patient requires stabilization from this orthosis in their left wrist and thumb.  He can wear this as needed to rest the area.  - Use chronic medication, Tylenol , topical diclofenac /pain cream/pain patch, relative rest, compression, and ice/heat as needed for pain.   - Follow up in 3 weeks for reevaluation.  He should not need repeat x-rays at that time.  We discussed consideration of steroid injections versus occupational therapy.  I spent a total of 62 minutes in both face-to-face and non face-to-face  activities, excluding procedures performed, for this visit on the date of this encounter which included: Preparation reviewing recent hospital notes/labs/imaging, Patient Encounter, Counseling/Educating on underlying etiology of symptoms plus treatment moving forward with shared decision making, and Documentation.  Contact our office with any questions or concerns.  Follow up as indicated, or sooner should any new problems arise, if conditions worsen, or if they are otherwise concerned.    Prentice Reges, DO [facility] [address] [place], [place] [address] Phone: [phone]   This note was generated in part with voice recognition software and I apologize for any typographical errors that were not detected and corrected.

## 2024-05-28 ENCOUNTER — Inpatient Hospital Stay
Admission: EM | Admit: 2024-05-28 | Discharge: 2024-06-01 | Disposition: A | Discharge status: Skilled Nursing Facility | Attending: Obstetrics and Gynecology | Admitting: Obstetrics and Gynecology

## 2024-05-28 ENCOUNTER — Other Ambulatory Visit: Payer: Self-pay

## 2024-05-28 ENCOUNTER — Emergency Department

## 2024-05-28 ENCOUNTER — Telehealth: Payer: Self-pay

## 2024-05-28 ENCOUNTER — Telehealth: Payer: Self-pay | Admitting: Family Medicine

## 2024-05-28 DIAGNOSIS — Z8249 Family history of ischemic heart disease and other diseases of the circulatory system: Secondary | ICD-10-CM

## 2024-05-28 DIAGNOSIS — I4891 Unspecified atrial fibrillation: Secondary | ICD-10-CM | POA: Diagnosis present

## 2024-05-28 DIAGNOSIS — Z7952 Long term (current) use of systemic steroids: Secondary | ICD-10-CM

## 2024-05-28 DIAGNOSIS — F17210 Nicotine dependence, cigarettes, uncomplicated: Secondary | ICD-10-CM | POA: Diagnosis present

## 2024-05-28 DIAGNOSIS — I5022 Chronic systolic (congestive) heart failure: Secondary | ICD-10-CM | POA: Diagnosis present

## 2024-05-28 DIAGNOSIS — I10 Essential (primary) hypertension: Secondary | ICD-10-CM | POA: Diagnosis not present

## 2024-05-28 DIAGNOSIS — E669 Obesity, unspecified: Secondary | ICD-10-CM | POA: Diagnosis not present

## 2024-05-28 DIAGNOSIS — E876 Hypokalemia: Secondary | ICD-10-CM | POA: Diagnosis present

## 2024-05-28 DIAGNOSIS — I110 Hypertensive heart disease with heart failure: Secondary | ICD-10-CM | POA: Diagnosis present

## 2024-05-28 DIAGNOSIS — Z720 Tobacco use: Secondary | ICD-10-CM | POA: Diagnosis present

## 2024-05-28 DIAGNOSIS — B182 Chronic viral hepatitis C: Secondary | ICD-10-CM | POA: Diagnosis present

## 2024-05-28 DIAGNOSIS — E1169 Type 2 diabetes mellitus with other specified complication: Secondary | ICD-10-CM | POA: Diagnosis present

## 2024-05-28 DIAGNOSIS — E785 Hyperlipidemia, unspecified: Secondary | ICD-10-CM | POA: Diagnosis not present

## 2024-05-28 DIAGNOSIS — Z833 Family history of diabetes mellitus: Secondary | ICD-10-CM

## 2024-05-28 DIAGNOSIS — Z5982 Transportation insecurity: Secondary | ICD-10-CM

## 2024-05-28 DIAGNOSIS — M19032 Primary osteoarthritis, left wrist: Secondary | ICD-10-CM | POA: Diagnosis present

## 2024-05-28 DIAGNOSIS — R7989 Other specified abnormal findings of blood chemistry: Secondary | ICD-10-CM

## 2024-05-28 DIAGNOSIS — F17290 Nicotine dependence, other tobacco product, uncomplicated: Secondary | ICD-10-CM | POA: Diagnosis present

## 2024-05-28 DIAGNOSIS — J4489 Other specified chronic obstructive pulmonary disease: Secondary | ICD-10-CM | POA: Diagnosis present

## 2024-05-28 DIAGNOSIS — I428 Other cardiomyopathies: Secondary | ICD-10-CM | POA: Diagnosis present

## 2024-05-28 DIAGNOSIS — E66812 Obesity, class 2: Secondary | ICD-10-CM | POA: Diagnosis present

## 2024-05-28 DIAGNOSIS — F1010 Alcohol abuse, uncomplicated: Secondary | ICD-10-CM | POA: Diagnosis present

## 2024-05-28 DIAGNOSIS — J449 Chronic obstructive pulmonary disease, unspecified: Secondary | ICD-10-CM | POA: Diagnosis present

## 2024-05-28 DIAGNOSIS — Z91148 Patient's other noncompliance with medication regimen for other reason: Secondary | ICD-10-CM

## 2024-05-28 DIAGNOSIS — J439 Emphysema, unspecified: Secondary | ICD-10-CM | POA: Diagnosis present

## 2024-05-28 DIAGNOSIS — Z8419 Family history of other disorders of kidney and ureter: Secondary | ICD-10-CM

## 2024-05-28 DIAGNOSIS — E1159 Type 2 diabetes mellitus with other circulatory complications: Secondary | ICD-10-CM | POA: Diagnosis not present

## 2024-05-28 DIAGNOSIS — J42 Unspecified chronic bronchitis: Secondary | ICD-10-CM

## 2024-05-28 DIAGNOSIS — I4819 Other persistent atrial fibrillation: Secondary | ICD-10-CM

## 2024-05-28 DIAGNOSIS — Z7984 Long term (current) use of oral hypoglycemic drugs: Secondary | ICD-10-CM

## 2024-05-28 DIAGNOSIS — I2510 Atherosclerotic heart disease of native coronary artery without angina pectoris: Secondary | ICD-10-CM | POA: Diagnosis present

## 2024-05-28 DIAGNOSIS — R0602 Shortness of breath: Principal | ICD-10-CM

## 2024-05-28 DIAGNOSIS — Z9981 Dependence on supplemental oxygen: Secondary | ICD-10-CM

## 2024-05-28 DIAGNOSIS — E0590 Thyrotoxicosis, unspecified without thyrotoxic crisis or storm: Secondary | ICD-10-CM | POA: Diagnosis present

## 2024-05-28 DIAGNOSIS — R5381 Other malaise: Secondary | ICD-10-CM | POA: Diagnosis present

## 2024-05-28 DIAGNOSIS — Z7901 Long term (current) use of anticoagulants: Secondary | ICD-10-CM

## 2024-05-28 DIAGNOSIS — Z79899 Other long term (current) drug therapy: Secondary | ICD-10-CM

## 2024-05-28 DIAGNOSIS — Z6835 Body mass index (BMI) 35.0-35.9, adult: Secondary | ICD-10-CM

## 2024-05-28 DIAGNOSIS — Z1152 Encounter for screening for COVID-19: Secondary | ICD-10-CM

## 2024-05-28 DIAGNOSIS — I4821 Permanent atrial fibrillation: Principal | ICD-10-CM | POA: Diagnosis present

## 2024-05-28 DIAGNOSIS — I2721 Secondary pulmonary arterial hypertension: Secondary | ICD-10-CM | POA: Diagnosis present

## 2024-05-28 LAB — BASIC METABOLIC PANEL WITH GFR
Anion gap: 19 — ABNORMAL HIGH (ref 5–15)
BUN: 8 mg/dL (ref 8–23)
CO2: 19 mmol/L — ABNORMAL LOW (ref 22–32)
Calcium: 9.3 mg/dL (ref 8.9–10.3)
Chloride: 92 mmol/L — ABNORMAL LOW (ref 98–111)
Creatinine, Ser: 0.98 mg/dL (ref 0.61–1.24)
GFR, Estimated: >60 mL/min
Glucose, Bld: 152 mg/dL — ABNORMAL HIGH (ref 70–99)
Potassium: 4.2 mmol/L (ref 3.5–5.1)
Sodium: 130 mmol/L — ABNORMAL LOW (ref 135–145)

## 2024-05-28 LAB — CBC
HCT: 40.1 % (ref 39.0–52.0)
Hemoglobin: 12.9 g/dL — ABNORMAL LOW (ref 13.0–17.0)
MCH: 26.8 pg (ref 26.0–34.0)
MCHC: 32.2 g/dL (ref 30.0–36.0)
MCV: 83.2 fL (ref 80.0–100.0)
Platelets: 210 K/uL (ref 150–400)
RBC: 4.82 MIL/uL (ref 4.22–5.81)
RDW: 15.9 % — ABNORMAL HIGH (ref 11.5–15.5)
WBC: 8 K/uL (ref 4.0–10.5)
nRBC: 0 % (ref 0.0–0.2)

## 2024-05-28 LAB — TROPONIN T, HIGH SENSITIVITY
Troponin T High Sensitivity: 21 ng/L — ABNORMAL HIGH (ref 0–19)
Troponin T High Sensitivity: 18 ng/L (ref 0–19)

## 2024-05-28 LAB — RESP PANEL BY RT-PCR (RSV, FLU A&B, COVID)  RVPGX2
Influenza A by PCR: NEGATIVE
Influenza B by PCR: NEGATIVE
Resp Syncytial Virus by PCR: NEGATIVE
SARS Coronavirus 2 by RT PCR: NEGATIVE

## 2024-05-28 LAB — DIGOXIN LEVEL: Digoxin Level: <0.6 ng/mL — ABNORMAL LOW (ref 0.8–2.0)

## 2024-05-28 LAB — CBG MONITORING, ED: Glucose-Capillary: 116 mg/dL — ABNORMAL HIGH (ref 70–99)

## 2024-05-28 LAB — PRO BRAIN NATRIURETIC PEPTIDE: Pro Brain Natriuretic Peptide: 1026 pg/mL — ABNORMAL HIGH

## 2024-05-28 MED ORDER — INSULIN ASPART 100 UNIT/ML IJ SOLN
0.0000 [IU] | Freq: Three times a day (TID) | INTRAMUSCULAR | Status: DC
  Administered 2024-05-29 (×2): 1 [IU] via SUBCUTANEOUS
  Administered 2024-05-30 (×2): 2 [IU] via SUBCUTANEOUS
  Administered 2024-05-31 – 2024-06-01 (×3): 1 [IU] via SUBCUTANEOUS
  Filled 2024-05-28: qty 1
  Filled 2024-05-28: qty 2
  Filled 2024-05-28 (×4): qty 1
  Filled 2024-05-28: qty 2

## 2024-05-28 MED ORDER — IPRATROPIUM-ALBUTEROL 0.5-2.5 (3) MG/3ML IN SOLN
3.0000 mL | Freq: Once | RESPIRATORY_TRACT | Status: AC
  Administered 2024-05-28: 3 mL via RESPIRATORY_TRACT
  Filled 2024-05-28: qty 3

## 2024-05-28 MED ORDER — DILTIAZEM HCL 25 MG/5ML IV SOLN
10.0000 mg | Freq: Once | INTRAVENOUS | Status: AC
  Administered 2024-05-28: 10 mg via INTRAVENOUS
  Filled 2024-05-28: qty 5

## 2024-05-28 MED ORDER — IPRATROPIUM-ALBUTEROL 0.5-2.5 (3) MG/3ML IN SOLN
3.0000 mL | Freq: Four times a day (QID) | RESPIRATORY_TRACT | Status: DC
  Administered 2024-05-28 – 2024-05-29 (×4): 3 mL via RESPIRATORY_TRACT
  Filled 2024-05-28 (×5): qty 3

## 2024-05-28 MED ORDER — DIPHENHYDRAMINE HCL 50 MG/ML IJ SOLN: 12.5000 mg | Freq: Three times a day (TID) | INTRAMUSCULAR | Status: DC | PRN

## 2024-05-28 MED ORDER — NICOTINE 21 MG/24HR TD PT24
21.0000 mg | MEDICATED_PATCH | Freq: Every day | TRANSDERMAL | Status: DC
  Administered 2024-05-29 – 2024-06-01 (×4): 21 mg via TRANSDERMAL
  Filled 2024-05-28 (×5): qty 1

## 2024-05-28 MED ORDER — DM-GUAIFENESIN ER 30-600 MG PO TB12: 1.0000 | ORAL_TABLET | Freq: Two times a day (BID) | ORAL | Status: DC | PRN

## 2024-05-28 MED ORDER — ALBUTEROL SULFATE (2.5 MG/3ML) 0.083% IN NEBU: 3.0000 mL | INHALATION_SOLUTION | RESPIRATORY_TRACT | Status: DC | PRN

## 2024-05-28 MED ORDER — HYDRALAZINE HCL 20 MG/ML IJ SOLN: 5.0000 mg | INTRAMUSCULAR | Status: DC | PRN

## 2024-05-28 MED ORDER — ACETAMINOPHEN 325 MG PO TABS
650.0000 mg | ORAL_TABLET | Freq: Four times a day (QID) | ORAL | Status: DC | PRN
  Administered 2024-05-31 – 2024-06-01 (×2): 650 mg via ORAL
  Filled 2024-05-28 (×2): qty 2

## 2024-05-28 MED ORDER — FUROSEMIDE 10 MG/ML IJ SOLN
40.0000 mg | Freq: Two times a day (BID) | INTRAMUSCULAR | Status: DC
  Administered 2024-05-29: 40 mg via INTRAVENOUS
  Filled 2024-05-28: qty 4

## 2024-05-28 MED ORDER — INSULIN ASPART 100 UNIT/ML IJ SOLN: 0.0000 [IU] | Freq: Every day | INTRAMUSCULAR | Status: DC

## 2024-05-28 MED ORDER — DILTIAZEM HCL 25 MG/5ML IV SOLN
15.0000 mg | Freq: Once | INTRAVENOUS | Status: AC
  Administered 2024-05-28: 15 mg via INTRAVENOUS
  Filled 2024-05-28: qty 5

## 2024-05-28 MED ORDER — FUROSEMIDE 10 MG/ML IJ SOLN
40.0000 mg | Freq: Once | INTRAMUSCULAR | Status: AC
  Administered 2024-05-28: 40 mg via INTRAVENOUS
  Filled 2024-05-28: qty 4

## 2024-05-28 MED ORDER — IPRATROPIUM-ALBUTEROL 0.5-2.5 (3) MG/3ML IN SOLN: 3.0000 mL | RESPIRATORY_TRACT | Status: DC

## 2024-05-28 MED ORDER — METOPROLOL TARTRATE 5 MG/5ML IV SOLN: 2.5000 mg | INTRAVENOUS | Status: DC | PRN

## 2024-05-28 MED ORDER — IOHEXOL 350 MG/ML SOLN
100.0000 mL | Freq: Once | INTRAVENOUS | Status: AC | PRN
  Administered 2024-05-28: 75 mL via INTRAVENOUS

## 2024-05-28 MED ORDER — MORPHINE SULFATE (PF) 4 MG/ML IV SOLN
4.0000 mg | Freq: Once | INTRAVENOUS | Status: AC
  Administered 2024-05-28: 4 mg via INTRAVENOUS
  Filled 2024-05-28: qty 1

## 2024-05-28 NOTE — Telephone Encounter (Signed)
 Copied from CRM #[id number]. Topic: General - Other >> May 28, 2024  8:48 AM Larissa RAMAN wrote: Reason for CRM: Holley Cleaves with [facility] states that patient was a non admit on 05/26/24 due to daughter not being able to provide care for patient. Ms. Cleaves states she advised patient to return to ER and inform that he has no caregiver and needs rehab.  Callback # [phone]

## 2024-05-28 NOTE — Telephone Encounter (Signed)
 Routing to provider, FYI.

## 2024-05-28 NOTE — Transitions of Care (Post Inpatient/ED Visit) (Signed)
 "  05/28/2024  Name: Eugene Henderson MRN: [id number] DOB: 1951-03-24  Today's TOC FU Call Status: Today's TOC FU Call Status:: Successful TOC FU Call Completed TOC FU Call Complete Date: 05/28/24  Patient's Name and Date of Birth confirmed. Name, DOB  Transition Care Management Follow-up Telephone Call Date of Discharge: 05/25/24 Discharge Facility: Other ([facility]) Name of Other ([facility]) [facility]charge [facility] Type of Discharge: Inpatient Admission Primary Inpatient Discharge Diagnosis:: COPD How have you been since you were released from the hospital?: Same Any questions or concerns?: Yes Patient Questions/Concerns:: question about wound, has no Oxygen Patient Questions/Concerns Addressed: Notified Provider of Patient Questions/Concerns  Items Reviewed: Did you receive and understand the discharge instructions provided?: Yes Medications obtained,verified, and reconciled?: Yes (Medications Reviewed) Any new allergies since your discharge?: No Dietary orders reviewed?: Yes  Medications Reviewed Today: Medications Reviewed Today     Reviewed by Emmitt Pan, LPN (Licensed Practical Nurse) on 05/28/24 at 1003  Med List Status: <None>   Medication Order Taking? Sig Documenting Provider Last Dose Status Informant  acetaminophen  (TYLENOL ) 500 MG tablet [id number] Yes Take 1-2 tablets (500-1,000 mg total) by mouth every 6 (six) hours as needed for mild pain (pain score 1-3), fever or headache.  Patient taking differently: Take 1,000 mg by mouth 2 (two) times daily.   Louder Duwaine SQUIBB, DO  Active Pharmacy Records  albuterol  (VENTOLIN  HFA) 108 [phone] Base) MCG/ACT inhaler [id number] Yes Inhale 1-2 puffs into the lungs every 6 (six) hours as needed for wheezing or shortness of breath. Louder Duwaine SQUIBB, DO  Active Pharmacy Records  alum & mag hydroxide-simeth (MAALOX/MYLANTA) 200-200-20 MG/5ML suspension [id number] Yes Take 30 mLs by mouth every 4 (four) hours as needed for  indigestion. Sonjia Held, MD  Active   atorvastatin  (LIPITOR) 40 MG tablet [id number] Yes TAKE 1 TABLET EVERY DAY Welles, Walthall, DO  Active Pharmacy Records  Blood Glucose Monitoring Suppl DEVI [id number] Yes 1 each by Does not apply route as directed. Dispense based on patient and insurance preference. Use up to four times daily as directed. (FOR ICD-10 E10.9, E11.9). Melvin Pao, NP  Active Pharmacy Records  Blood Pressure Monitoring (BLOOD PRESSURE CUFF) MISC [id number] Yes Check blood pressure as instructed by your physician Franchester, Cadence H, PA-C  Active Pharmacy Records  cyclobenzaprine  (FLEXERIL ) 5 MG tablet [id number] Yes Take 1 tablet (5 mg total) by mouth 3 (three) times daily as needed for muscle spasms. Pokhrel, Laxman, MD  Active   diclofenac  Sodium (VOLTAREN ) 1 % GEL [id number] Yes Apply 4 g topically 4 (four) times daily as needed (Apply to L wrist, L shoulder as desired for pain). Pokhrel, Laxman, MD  Active   digoxin  (LANOXIN ) 0.125 MG tablet [id number] Yes TAKE 1/2 TABLET BY MOUTH EVERY DAY Cindie Ole DASEN, MD  Active Pharmacy Records  ELIQUIS  5 MG TABS tablet [id number] Yes TAKE 1 TABLET TWICE DAILY Cindie Ole DASEN, MD  Active Pharmacy Records  empagliflozin  (JARDIANCE ) 10 MG TABS tablet [id number] Yes TAKE 1 TABLET EVERY DAY Lemario, Chaikin, DO  Active Pharmacy Records  folic acid  (FOLVITE ) 1 MG tablet [id number] Yes Take 1 tablet (1 mg total) by mouth daily. Louder Duwaine SQUIBB, DO  Active Pharmacy Records  furosemide  (LASIX ) 40 MG tablet [id number] Yes Take 1 tablet (40 mg total) by mouth daily. Cindie Ole DASEN, MD  Active Pharmacy Records  Glucose Blood (BLOOD GLUCOSE TEST STRIPS) STRP [id number] Yes 1 each by Does not apply route as directed. Dispense based on patient and  insurance preference. Use up to four times daily as directed. (FOR ICD-10 E10.9, E11.9). Melvin Pao, NP  Active Pharmacy Records  Lancet Device MISC [id number] Yes 1 each by Does not apply route as  directed. Dispense based on patient and insurance preference. Use up to four times daily as directed. (FOR ICD-10 E10.9, E11.9). Melvin Pao, NP  Active Pharmacy Records  Lancets MISC [id number] Yes 1 each by Does not apply route as directed. Dispense based on patient and insurance preference. Use up to four times daily as directed. (FOR ICD-10 E10.9, E11.9). Melvin Pao, NP  Active Pharmacy Records  metFORMIN  (GLUCOPHAGE ) 500 MG tablet [id number] Yes TAKE 1 TABLET TWICE DAILY WITH MEALS Addiel, Mccardle, DO  Active Pharmacy Records  metoprolol  succinate (TOPROL -XL) 50 MG 24 hr tablet [id number] Yes Take 1 tablet (50 mg total) by mouth daily. Reduced from 150 mg. Awanda City, MD  Active Pharmacy Records  oxyCODONE  10 MG TABS [id number] Yes Take 1 tablet (10 mg total) by mouth every 4 (four) hours as needed for moderate pain (pain score 4-6), severe pain (pain score 7-10) or breakthrough pain. Pokhrel, Laxman, MD  Active   pantoprazole  (PROTONIX ) 40 MG tablet [id number] Yes Take 1 tablet (40 mg total) by mouth 2 (two) times daily. Vicci Duwaine SQUIBB, DO  Active Pharmacy Records  polyethylene glycol (MIRALAX  / GLYCOLAX ) 17 g packet [id number] Yes Take 17 g by mouth daily as needed for mild constipation or moderate constipation (not helped with stool softner). Pokhrel, Laxman, MD  Active   potassium chloride  SA (KLOR-CON  M) 20 MEQ tablet [id number] Yes TAKE 1 TABLET BY MOUTH EVERY DAY Neyland, Pettengill, DO  Active Pharmacy Records  sacubitril -valsartan  (ENTRESTO ) 49-51 MG [id number] Yes Take 1 tablet by mouth 2 (two) times daily. Franchester, Cadence H, PA-C  Active Pharmacy Records  Semaglutide  (RYBELSUS ) 7 MG TABS [id number] Yes Take 1 tablet (7 mg total) by mouth daily. Vicci Duwaine SQUIBB, DO  Active Pharmacy Records  senna-docusate (SENOKOT-S) 8.6-50 MG tablet [id number] Yes Take 1 tablet by mouth at bedtime. Pokhrel, Laxman, MD  Active   spironolactone  (ALDACTONE ) 25 MG tablet [id number] Yes Take 1 tablet (25 mg  total) by mouth daily. Vicci Duwaine SQUIBB, DO  Active Pharmacy Records  Tiotropium Bromide-Olodaterol (STIOLTO RESPIMAT ) 2.5-2.5 MCG/ACT AERS [id number] Yes INHALE 2 PUFFS BY MOUTH INTO THE LUNGS DAILY Isadora Hose, MD  Active Pharmacy Records            Home Care and Equipment/Supplies: Were Home Health Services Ordered?: Yes Name of Home Health Agency:: unknown Has Agency set up a time to come to your home?: Yes First Home Health Visit Date: 05/27/24 Any new equipment or medical supplies ordered?: No  Functional Questionnaire: Do you need assistance with bathing/showering or dressing?: Yes Do you need assistance with meal preparation?: Yes Do you need assistance with eating?: No Do you have difficulty maintaining continence: Yes Do you need assistance with getting out of bed/getting out of a chair/moving?: No Do you have difficulty managing or taking your medications?: Yes  Follow up appointments reviewed: PCP Follow-up appointment confirmed?: No (daughter taking him to ER) MD Provider Line Number:[phone] Given: No Specialist Hospital Follow-up appointment confirmed?: NA Do you need transportation to your follow-up appointment?: No Do you understand care options if your condition(s) worsen?: Yes-patient verbalized understanding    SIGNATURE Julian Lemmings, LPN [facility] Nurse Health Advisor Direct Dial [phone]  "

## 2024-05-28 NOTE — ED Notes (Signed)
 CCMD called to admit patient to monitor

## 2024-05-28 NOTE — ED Provider Notes (Signed)
 [facility] Provider Note    Event Date/Time   First MD Initiated Contact with Patient 05/28/24 1716     (approximate)   History   Shortness of Breath   HPI  Eugene Henderson is a 73 y.o. male with history of COPD, CHF, history of A-fib on digoxin  and Eliquis , presenting with shortness of breath.  He was discharged from SNF yesterday.  Was on oxygen during admission but they sent him home with empty oxygen tank.  States that at the SNF he was on 3 L.  Had been off oxygen for several days.  Started feel more short of breath.  Does note a mild cough, states that he has some mild midsternal chest pain that is nonradiating.  States that he has taken all his medications including once this morning.  No unilateral calf swelling or tenderness, no reported fevers.  On independent chart review, he was admitted in late November early December for A-fib with RVR after missed doses of metoprolol .  Received IV Dilt with improvement.  Was continued on his Eliquis , digoxin  and metoprolol .  Was also having COPD exacerbation that improved during hospitalization, received prednisone  and doxycycline .     Physical Exam   Triage Vital Signs: ED Triage Vitals  Encounter Vitals Group     BP 05/28/24 1709 (!) 147/69     Girls Systolic BP Percentile --      Girls Diastolic BP Percentile --      Boys Systolic BP Percentile --      Boys Diastolic BP Percentile --      Pulse Rate 05/28/24 1709 (!) 137     Resp 05/28/24 1709 20     Temp 05/28/24 1709 97.9 F (36.6 C)     Temp Source 05/28/24 1709 Oral     SpO2 05/28/24 1709 97 %     Weight 05/28/24 1710 260 lb (117.9 kg)     Height 05/28/24 1710 6' (1.829 m)     Head Circumference --      Peak Flow --      Pain Score 05/28/24 1709 0     Pain Loc --      Pain Education --      Exclude from Growth Chart --     Most recent vital signs: Vitals:   05/28/24 1800 05/28/24 1855  BP: (!) 124/94 119/70  Pulse: (!) 124 (!) 114   Resp: 12 17  Temp:    SpO2: 99% 100%     General: Awake, no distress.  CV:  Good peripheral perfusion.  Resp:  Normal effort.  No tachypnea, distant breath sounds bilaterally, no obvious wheezing or crackles Abd:  No distention.  Soft nontender Other:  Trace lower extremity edema without unilateral calf swelling or tenderness   ED Results / Procedures / Treatments   Labs (all labs ordered are listed, but only abnormal results are displayed) Labs Reviewed  BASIC METABOLIC PANEL WITH GFR - Abnormal; Notable for the following components:      Result Value   Sodium 130 (*)    Chloride 92 (*)    CO2 19 (*)    Glucose, Bld 152 (*)    Anion gap 19 (*)    All other components within normal limits  CBC - Abnormal; Notable for the following components:   Hemoglobin 12.9 (*)    RDW 15.9 (*)    All other components within normal limits  DIGOXIN  LEVEL - Abnormal; Notable for the following  components:   Digoxin  Level <0.6 (*)    All other components within normal limits  PRO BRAIN NATRIURETIC PEPTIDE - Abnormal; Notable for the following components:   Pro Brain Natriuretic Peptide 1,026.0 (*)    All other components within normal limits  TROPONIN T, HIGH SENSITIVITY - Abnormal; Notable for the following components:   Troponin T High Sensitivity 21 (*)    All other components within normal limits  RESP PANEL BY RT-PCR (RSV, FLU A&B, COVID)  RVPGX2  MAGNESIUM   TROPONIN T, HIGH SENSITIVITY     EKG  EKG shows, baseline is wandering due to patient movement but appears to be in A-fib with RVR to 137, normal QS, QTc is 576,   RADIOLOGY On my independent interpretation, chest x-ray without obvious consolidation   PROCEDURES:  Critical Care performed: Yes, see critical care procedure note(s)  .Critical Care  Performed by: Waymond Lorelle Cummins, MD Authorized by: Waymond Lorelle Cummins, MD   Critical care provider statement:    Critical care time (minutes):  40   Critical care was time spent  personally by me on the following activities:  Development of treatment plan with patient or surrogate, discussions with consultants, evaluation of patient's response to treatment, examination of patient, ordering and review of laboratory studies, ordering and review of radiographic studies, ordering and performing treatments and interventions, pulse oximetry, re-evaluation of patient's condition and review of old charts    MEDICATIONS ORDERED IN ED: Medications  metoprolol  tartrate (LOPRESSOR ) injection 2.5 mg (has no administration in time range)  diltiazem  (CARDIZEM ) injection 10 mg (10 mg Intravenous Given 05/28/24 1857)  diltiazem  (CARDIZEM ) injection 15 mg (15 mg Intravenous Given 05/28/24 1926)  ipratropium-albuterol  (DUONEB) 0.5-2.5 (3) MG/3ML nebulizer solution 3 mL (3 mLs Nebulization Given 05/28/24 1925)  furosemide  (LASIX ) injection 40 mg (40 mg Intravenous Given 05/28/24 1926)  morphine  (PF) 4 MG/ML injection 4 mg (4 mg Intravenous Given 05/28/24 1949)  iohexol  (OMNIPAQUE ) 350 MG/ML injection 100 mL (75 mLs Intravenous Contrast Given 05/28/24 2119)     IMPRESSION / MDM / ASSESSMENT AND PLAN / ED COURSE  I reviewed the triage vital signs and the nursing notes.                              Differential diagnosis includes, but is not limited to, arrhythmia, medication noncompliance, consider COPD exacerbation but patient has no active wheezing on exam, did also consider CHF, ACS, angina.  He will also consider pneumonia viral illness.  Will get labs, EKG, troponin, chest x-ray.  Respiratory swab.  Patient's presentation is most consistent with acute presentation with potential threat to life or bodily function.  Independent interpretation of labs and imaging below.  Patient's digoxin  is low, patient states that he thinks he has been taking all his medications.  Still feels shortness of breath.  No obvious wheezing on exam but will give him a DuoNeb, his BNP is mildly elevated, and  has trace lower extremity edema, will give him IV Lasix .  Will plan to get a CT PE study as well given his persistent shortness of breath.  Will likely need to be admitted for A-fib with RVR requiring IV meds.  Rest of clinical course as below.  Heart rate improving after IV meds.  His dig levels are not elevated, suspect he might be noncompliant with his medications.  Consulted hospitalist for admission.  He is admitted.  The patient is on the cardiac monitor  to evaluate for evidence of arrhythmia and/or significant heart rate changes.   Clinical Course as of 05/28/24 2204  Mon May 28, 2024  1817 DG Chest 2 View No active cardiopulmonary disease. Cardiomegaly.  [TT]  [id number] Independent review of labs, BNP is elevated but downtrending compared to prior, he is mildly hyponatremic, troponins mildly elevated, no leukocytosis.  His dig level is not elevated.  He still in A-fib with RVR could be contributing to his symptoms, given a dose of IV Dilt here.  He is otherwise satting 99 to 100% on room air. [TT]  2114 On reassessment patient's heart rate is now in the 100s. [TT]    Clinical Course User Index [TT] Waymond Lorelle Cummins, MD     FINAL CLINICAL IMPRESSION(S) / ED DIAGNOSES   Final diagnoses:  Shortness of breath  Atrial fibrillation with rapid ventricular response (HCC)  Elevated brain natriuretic peptide (BNP) level     Rx / DC Orders   ED Discharge Orders     None        Note:  This document was prepared using Dragon voice recognition software and may include unintentional dictation errors.    Waymond Lorelle Cummins, MD 05/28/24 2204

## 2024-05-28 NOTE — H&P (Signed)
 " History and Physical    Eugene Henderson FMW:[id number] DOB: 02-02-1951 DOA: 05/28/2024  Referring MD/NP/PA:   PCP: Louder Duwaine SQUIBB, DO   Patient coming from:  The patient is coming from home.     Chief Complaint: SOB.  HPI: Eugene Henderson is a 73 y.o. male with medical history significant of A fib on Eliquis , sCHF with EF 40-45%, HTN, HLD, DM, COPD, tobacco abuse, obesity, hyperthyroidism, medication noncompliance, who presents with SOB.  Patient was recently hospitalized due to A-fib with RVR, COPD and CHF exacerbation, also had nondisplaced fracture of the lateral aspect of the radial head of left shoulder. Pt was discharged to SNF on 3L of oxygen.  Patient states that he was discharged home yesterday with empty oxygen tank from SNF. He states that he took his medications yesterday but not today. He states that he continues to have dry cough and SOB.  He also reports some mild substernal chest pain.  No fever or chills.  No nausea, vomiting, diarrhea or abdominal pain.  No symptoms of UTI.  Data reviewed independently and ED Course: pt was found to have troponin 21 --> 18, BNP 1026, digoxin  level subtherapeutic < 0.6, negative PCR for COVID, flu and RSV, GFR > 60. CXR showed cardiomegaly without infiltration. CTA negative for PE. Patient is placed in PCU for observation.  CTA: 1. No evidence of pulmonary embolism to the segmental level; subsegmental arteries are poorly evaluated due to respiratory motion. 2. Enlarged pulmonary trunk measuring 3.4 cm, consistent with pulmonary arterial hypertension. Previously 3. Moderate cardiomegaly with biatrial chamber predominance, without pericardial effusion. 3-vessel coronary artery disease (cad). 4. Ascending aortic dilatation to 4.7 cm, without dissection or stenosis. Semiannual CTA or MRA follow-up and referral to cardiothoracic surgery recommended if not already done . 5. Mild centrilobular and paraseptal emphysema in the upper lobes;  pulmonary emphysema is an independent risk factor for lung cancer, and consideration is recommended for evaluation for a low-dose CT lung cancer screening program. 6. No consolidation, suspicious nodule, or pleural effusion.     EKG: I have personally reviewed.  A-fib, QTc 576, left axis deviation, poor R wave pression.   Review of Systems:   General: no fevers, chills, no body weight gain, has fatigue HEENT: no blurry vision, hearing changes or sore throat Respiratory: has dyspnea, coughing, no wheezing CV: no chest pain, no palpitations GI: no nausea, vomiting, abdominal pain, diarrhea, constipation GU: no dysuria, burning on urination, increased urinary frequency, hematuria  Ext: no leg edema Neuro: no unilateral weakness, numbness, or tingling, no vision change or hearing loss Skin: no rash, no skin tear. MSK: No muscle spasm, no deformity, no limitation of range of movement in spin. Has left shoulder pain. Heme: No easy bruising.  Travel history: No recent long distant travel.   Allergy: Allergies[1]  Past Medical History:  Diagnosis Date   Arthritis    Ascending aortic aneurysm    a. 09/2017 Stable TAA - 5.1cm.   Asthma    Chronic systolic CHF (congestive heart failure) (HCC)    a. EF 25-30% by echo in 07/2016 with cath showing no significant CAD b. 01/2017: EF 30-35% with diffuse HK and moderate MR; c. 07/2017 Echo: EF 30-35%, diff hK. Mild MR, mildly dil LA. PASP .   Diverticulitis    Diverticulitis of large intestine with perforation without abscess or bleeding 05/13/2017   Hypertension    Hyperthyroidism    NICM (nonischemic cardiomyopathy) (HCC)    Noncompliance    Persistent  atrial fibrillation (HCC)    a. CHA2DS2VASc = 3-->Eliquis  (? compliance).    Past Surgical History:  Procedure Laterality Date   CARDIOVERSION N/A 06/23/2022   Procedure: CARDIOVERSION;  Surgeon: Gardenia Led, DO;  Location: [facility] OR[facility];  Service: Cardiovascular;  Laterality: N/A;    CARDIOVERSION N/A 06/25/2022   Procedure: CARDIOVERSION;  Surgeon: Gardenia Led, DO;  Location: [facility];  Service: Cardiovascular;  Laterality: N/A;   CARDIOVERSION N/A 09/29/2022   Procedure: CARDIOVERSION;  Surgeon: Rolan Ezra RAMAN, MD;  Location: [facility] INVASIVE CV LAB;  Service: Cardiovascular;  Laterality: N/A;   COLONOSCOPY N/A 08/27/2019   Procedure: COLONOSCOPY;  Surgeon: Toledo, Ladell POUR, MD;  Location: [facility] ENDOSCOPY;  Service: Gastroenterology;  Laterality: N/A;   ENDOVASCULAR STENT GRAFT (AAA) N/A 06/17/2022   Procedure: ENDOVASCULAR REPAIR/STENT GRAFT;  Surgeon: Marea Selinda RAMAN, MD;  Location: [facility] INVASIVE CV LAB;  Service: Cardiovascular;  Laterality: N/A;   ICD IMPLANT N/A 11/03/2022   Procedure: ICD IMPLANT;  Surgeon: Cindie Ole DASEN, MD;  Location: [facility] INVASIVE CV LAB;  Service: Cardiovascular;  Laterality: N/A;   JOINT REPLACEMENT Right    RIGHT HEART CATH Right 09/21/2022   Procedure: RIGHT HEART CATH;  Surgeon: Rolan Ezra RAMAN, MD;  Location: [facility] INVASIVE CV LAB;  Service: Cardiovascular;  Laterality: Right;   RIGHT/LEFT HEART CATH AND CORONARY ANGIOGRAPHY N/A 08/02/2016   Procedure: Right/Left Heart Cath and Coronary Angiography;  Surgeon: Deatrice DELENA Cage, MD;  Location: [facility] INVASIVE CV LAB;  Service: Cardiovascular;  Laterality: N/A;   RIGHT/LEFT HEART CATH AND CORONARY ANGIOGRAPHY Bilateral 12/16/2023   Procedure: RIGHT/LEFT HEART CATH AND CORONARY ANGIOGRAPHY;  Surgeon: Cage Deatrice DELENA, MD;  Location: [facility] INVASIVE CV LAB;  Service: Cardiovascular;  Laterality: Bilateral;   TEE WITHOUT CARDIOVERSION N/A 06/23/2022   Procedure: TRANSESOPHAGEAL ECHOCARDIOGRAM;  Surgeon: Gardenia Led, DO;  Location: [facility];  Service: Cardiovascular;  Laterality: N/A;   TESTICLE SURGERY     Patient states that he had to have the tube fixed.    Social History:  reports that he has been smoking pipe and cigarettes. He has never used smokeless tobacco. He reports current alcohol  use of about 2.0 standard drinks of alcohol per week. He reports that he does not use drugs.  Family History:  Family History  Problem Relation Age of Onset   Diabetes Mother    Diabetes Father    Heart disease Brother    Heart attack Brother    Diabetes Brother    Kidney failure Brother    Thyroid  disease Neg Hx      Prior to Admission medications  Medication Sig Start Date End Date Taking? Authorizing Provider  acetaminophen  (TYLENOL ) 500 MG tablet Take 1-2 tablets (500-1,000 mg total) by mouth every 6 (six) hours as needed for mild pain (pain score 1-3), fever or headache. Patient taking differently: Take 1,000 mg by mouth 2 (two) times daily. 04/12/23   Ronk, Megan P, DO  albuterol  (VENTOLIN  HFA) 108 (90 Base) MCG/ACT inhaler Inhale 1-2 puffs into the lungs every 6 (six) hours as needed for wheezing or shortness of breath. 11/09/23   Ridling, Megan P, DO  alum & mag hydroxide-simeth (MAALOX/MYLANTA) 200-200-20 MG/5ML suspension Take 30 mLs by mouth every 4 (four) hours as needed for indigestion. 05/10/24   Pokhrel, Laxman, MD  atorvastatin  (LIPITOR) 40 MG tablet TAKE 1 TABLET EVERY DAY 05/01/24   Housand, Megan P, DO  Blood Glucose Monitoring Suppl DEVI 1 each by Does not apply route as directed. Dispense based on  patient and insurance preference. Use up to four times daily as directed. (FOR ICD-10 E10.9, E11.9). 04/12/24   Melvin Pao, NP  Blood Pressure Monitoring (BLOOD PRESSURE CUFF) MISC Check blood pressure as instructed by your physician 12/08/23   Franchester, Cadence H, PA-C  cyclobenzaprine  (FLEXERIL ) 5 MG tablet Take 1 tablet (5 mg total) by mouth 3 (three) times daily as needed for muscle spasms. 05/10/24   Pokhrel, Laxman, MD  diclofenac  Sodium (VOLTAREN ) 1 % GEL Apply 4 g topically 4 (four) times daily as needed (Apply to L wrist, L shoulder as desired for pain). 05/10/24   Pokhrel, Laxman, MD  digoxin  (LANOXIN ) 0.125 MG tablet TAKE 1/2 TABLET BY MOUTH EVERY DAY 11/07/23   Cindie Ole DASEN, MD  ELIQUIS  5 MG TABS tablet TAKE 1 TABLET TWICE DAILY 04/30/24   Cindie Ole DASEN, MD  empagliflozin  (JARDIANCE ) 10 MG TABS tablet TAKE 1 TABLET EVERY DAY 04/06/24   Parillo, Megan P, DO  folic acid  (FOLVITE ) 1 MG tablet Take 1 tablet (1 mg total) by mouth daily. 11/09/23   Dinkins, Megan P, DO  furosemide  (LASIX ) 40 MG tablet Take 1 tablet (40 mg total) by mouth daily. 11/03/23   Cindie Ole DASEN, MD  Glucose Blood (BLOOD GLUCOSE TEST STRIPS) STRP 1 each by Does not apply route as directed. Dispense based on patient and insurance preference. Use up to four times daily as directed. (FOR ICD-10 E10.9, E11.9). 04/12/24   Melvin Pao, NP  Lancet Device MISC 1 each by Does not apply route as directed. Dispense based on patient and insurance preference. Use up to four times daily as directed. (FOR ICD-10 E10.9, E11.9). 04/12/24   Melvin Pao, NP  Lancets MISC 1 each by Does not apply route as directed. Dispense based on patient and insurance preference. Use up to four times daily as directed. (FOR ICD-10 E10.9, E11.9). 04/12/24   Melvin Pao, NP  metFORMIN  (GLUCOPHAGE ) 500 MG tablet TAKE 1 TABLET TWICE DAILY WITH MEALS 05/01/24   Principato, Megan P, DO  metoprolol  succinate (TOPROL -XL) 50 MG 24 hr tablet Take 1 tablet (50 mg total) by mouth daily. Reduced from 150 mg. 04/14/24   Awanda City, MD  oxyCODONE  10 MG TABS Take 1 tablet (10 mg total) by mouth every 4 (four) hours as needed for moderate pain (pain score 4-6), severe pain (pain score 7-10) or breakthrough pain. 05/10/24   Pokhrel, Laxman, MD  pantoprazole  (PROTONIX ) 40 MG tablet Take 1 tablet (40 mg total) by mouth 2 (two) times daily. 11/09/23   Vicci Bouchard P, DO  polyethylene glycol (MIRALAX  / GLYCOLAX ) 17 g packet Take 17 g by mouth daily as needed for mild constipation or moderate constipation (not helped with stool softner). 05/10/24   Pokhrel, Laxman, MD  potassium chloride  SA (KLOR-CON  M) 20 MEQ tablet TAKE 1 TABLET BY  MOUTH EVERY DAY 03/09/24   Reimers, Megan P, DO  sacubitril -valsartan  (ENTRESTO ) 49-51 MG Take 1 tablet by mouth 2 (two) times daily. 12/08/23   Furth, Cadence H, PA-C  Semaglutide  (RYBELSUS ) 7 MG TABS Take 1 tablet (7 mg total) by mouth daily. 03/08/24   Handshoe, Megan P, DO  senna-docusate (SENOKOT-S) 8.6-50 MG tablet Take 1 tablet by mouth at bedtime. 05/10/24   Pokhrel, Laxman, MD  spironolactone  (ALDACTONE ) 25 MG tablet Take 1 tablet (25 mg total) by mouth daily. 11/09/23   Stehlin, Megan P, DO  Tiotropium Bromide-Olodaterol (STIOLTO RESPIMAT ) 2.5-2.5 MCG/ACT AERS INHALE 2 PUFFS BY MOUTH INTO THE LUNGS DAILY 10/19/23  Isadora Hose, MD    Physical Exam: Vitals:   05/28/24 1800 05/28/24 1855 05/28/24 2301 05/28/24 2337  BP: (!) 124/94 119/70 138/86 134/79  Pulse: (!) 124 (!) 114 96 (!) 111  Resp: 12 17 17  (!) 21  Temp:   98.3 F (36.8 C) 98.1 F (36.7 C)  TempSrc:   Oral Oral  SpO2: 99% 100% 100% 100%  Weight:      Height:       General: Not in acute distress HEENT:       Eyes: PERRL, EOMI, no jaundice       ENT: No discharge from the ears and nose, no pharynx injection, no tonsillar enlargement.        Neck: No JVD, no bruit, no mass felt. Heme: No neck lymph node enlargement. Cardiac: S1/S2, RRR, No murmurs, No gallops or rubs. Respiratory: No rales, wheezing, rhonchi or rubs. GI: Soft, nondistended, nontender, no rebound pain, no organomegaly, BS present. GU: No hematuria Ext: No pitting leg edema bilaterally. 1+DP/PT pulse bilaterally. Musculoskeletal: No joint deformities, No joint redness or warmth, no limitation of ROM in spin. Has left  shoulder tenderness, wearing sling  Skin: No rashes.  Neuro: Alert, oriented X3, cranial nerves II-XII grossly intact, moves all extremities normally.  Psych: Patient is not psychotic, no suicidal or hemocidal ideation.  Labs on Admission: I have personally reviewed following labs and imaging studies  CBC: Recent Labs  Lab  05/28/24 1727  WBC 8.0  HGB 12.9*  HCT 40.1  MCV 83.2  PLT 210   Basic Metabolic Panel: Recent Labs  Lab 05/28/24 1727 05/28/24 1935  NA 130*  --   K 4.2  --   CL 92*  --   CO2 19*  --   GLUCOSE 152*  --   BUN 8  --   CREATININE 0.98  --   CALCIUM  9.3  --   MG  --  1.7   GFR: Estimated Creatinine Clearance: 89 mL/min (by C-G formula based on SCr of 0.98 mg/dL). Liver Function Tests: No results for input(s): AST, ALT, ALKPHOS, BILITOT, PROT, ALBUMIN in the last 168 hours. No results for input(s): LIPASE, AMYLASE in the last 168 hours. No results for input(s): AMMONIA in the last 168 hours. Coagulation Profile: No results for input(s): INR, PROTIME in the last 168 hours. Cardiac Enzymes: No results for input(s): CKTOTAL, CKMB, CKMBINDEX, TROPONINI in the last 168 hours. BNP (last 3 results) Recent Labs    05/01/24 1257 05/28/24 1727  PROBNP 5,875.0* 1,026.0*   HbA1C: No results for input(s): HGBA1C in the last 72 hours. CBG: Recent Labs  Lab 05/28/24 2256  GLUCAP 116*   Lipid Profile: No results for input(s): CHOL, HDL, LDLCALC, TRIG, CHOLHDL, LDLDIRECT in the last 72 hours. Thyroid  Function Tests: No results for input(s): TSH, T4TOTAL, FREET4, T3FREE, THYROIDAB in the last 72 hours. Anemia Panel: No results for input(s): VITAMINB12, FOLATE, FERRITIN, TIBC, IRON, RETICCTPCT in the last 72 hours. Urine analysis:    Component Value Date/Time   COLORURINE YELLOW (A) 04/13/2024 0025   APPEARANCEUR CLOUDY (A) 04/13/2024 0025   APPEARANCEUR Cloudy (A) 11/09/2023 0928   LABSPEC 1.010 04/13/2024 0025   PHURINE 5.0 04/13/2024 0025   GLUCOSEU >=500 (A) 04/13/2024 0025   HGBUR LARGE (A) 04/13/2024 0025   BILIRUBINUR NEGATIVE 04/13/2024 0025   BILIRUBINUR negative 01/04/2024 1545   BILIRUBINUR Negative 11/09/2023 0928   KETONESUR NEGATIVE 04/13/2024 0025   PROTEINUR NEGATIVE 04/13/2024 0025    UROBILINOGEN 0.2 01/04/2024  1545   NITRITE NEGATIVE 04/13/2024 0025   LEUKOCYTESUR MODERATE (A) 04/13/2024 0025   Sepsis Labs: @LABRCNTIP (procalcitonin:4,lacticidven:4) ) Recent Results (from the past 240 hours)  Resp panel by RT-PCR (RSV, Flu A&B, Covid) Anterior Nasal Swab     Status: None   Collection Time: 05/28/24  7:35 PM   Specimen: Anterior Nasal Swab  Result Value Ref Range Status   SARS Coronavirus 2 by RT PCR NEGATIVE NEGATIVE Final    Comment: (NOTE) SARS-CoV-2 target nucleic acids are NOT DETECTED.  The SARS-CoV-2 RNA is generally detectable in upper respiratory specimens during the acute phase of infection. The lowest concentration of SARS-CoV-2 viral copies this assay can detect is 138 copies/mL. A negative result does not preclude SARS-Cov-2 infection and should not be used as the sole basis for treatment or other patient management decisions. A negative result may occur with  improper specimen collection/handling, submission of specimen other than nasopharyngeal swab, presence of viral mutation(s) within the areas targeted by this assay, and inadequate number of viral copies(<138 copies/mL). A negative result must be combined with clinical observations, patient history, and epidemiological information. The expected result is Negative.  Fact Sheet for Patients:  bloggercourse.com  Fact Sheet for Healthcare Providers:  seriousbroker.it  This test is no t yet approved or cleared by the [place]  FDA and  has been authorized for detection and/or diagnosis of SARS-CoV-2 by FDA under an Emergency Use Authorization (EUA). This EUA will remain  in effect (meaning this test can be used) for the duration of the COVID-19 declaration under Section 564(b)(1) of the Act, 21 U.S.C.section 360bbb-3(b)(1), unless the authorization is terminated  or revoked sooner.       Influenza A by PCR NEGATIVE NEGATIVE Final    Influenza B by PCR NEGATIVE NEGATIVE Final    Comment: (NOTE) The Xpert Xpress SARS-CoV-2/FLU/RSV plus assay is intended as an aid in the diagnosis of influenza from Nasopharyngeal swab specimens and should not be used as a sole basis for treatment. Nasal washings and aspirates are unacceptable for Xpert Xpress SARS-CoV-2/FLU/RSV testing.  Fact Sheet for Patients: bloggercourse.com  Fact Sheet for Healthcare Providers: seriousbroker.it  This test is not yet approved or cleared by the [place]  FDA and has been authorized for detection and/or diagnosis of SARS-CoV-2 by FDA under an Emergency Use Authorization (EUA). This EUA will remain in effect (meaning this test can be used) for the duration of the COVID-19 declaration under Section 564(b)(1) of the Act, 21 U.S.C. section 360bbb-3(b)(1), unless the authorization is terminated or revoked.     Resp Syncytial Virus by PCR NEGATIVE NEGATIVE Final    Comment: (NOTE) Fact Sheet for Patients: bloggercourse.com  Fact Sheet for Healthcare Providers: seriousbroker.it  This test is not yet approved or cleared by the [place]  FDA and has been authorized for detection and/or diagnosis of SARS-CoV-2 by FDA under an Emergency Use Authorization (EUA). This EUA will remain in effect (meaning this test can be used) for the duration of the COVID-19 declaration under Section 564(b)(1) of the Act, 21 U.S.C. section 360bbb-3(b)(1), unless the authorization is terminated or revoked.  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   -place in PCU as inpt - Continue home amiodarone , amlodipine , digoxin  -Continue Eliquis  - Check digoxin  level - Check TSH and free T4 level  Chronic systolic CHF (congestive heart failure) (HCC): 2D echo on 04/27/19 25 showed EF of 40-45%.  Patient does not have leg edema, but has elevated BNP 1026, indicating mild fluid overload. - Start IV Lasix  40 mg twice daily - Continue home Entresto , spironolactone   COPD (chronic obstructive pulmonary disease) (HCC): No wheezing -Bronchodilators and  Mucinex   Essential hypertension -Patient is on metoprolol , Entresto , spironolactone  and IV Lasix  - IV hydralazine  as needed  Hyperlipidemia -Lipitor  Diabetes mellitus with cardiac complication [facility]): Recent A1c 6.2, well-controlled.  Patient is taking metformin , semaglutide  currently -SSI  Hyperthyroidism: -Continue methimazole  - f/u TSH and free T4 level  Tobacco use: - Nicotine  patch - Did counseling about importance of quitting smoking  Obesity (BMI 30-39.9): Patient has Obesity Class II, with body weight 17.9  Kg and BMI 35.26  kg/m2.  - Encourage losing weight - Exercise and healthy diet      DVT ppx: on Eliquis   Code Status: Full code   Family Communication:     not done, no family member is at bed side.     Disposition Plan:  Anticipate discharge back to previous environment  Consults called:  none  Admission status and Level of care: Progressive:    for obs    Dispo: The patient is from: Home              Anticipated d/c is to: Home              Anticipated d/c date is: 1 day              Patient currently is not medically stable to d/c.    Severity of Illness:  The appropriate patient status for this patient is OBSERVATION. Observation status is  judged to be reasonable and necessary in order to provide the required intensity of service to ensure the patient's safety. The patient's presenting symptoms, physical exam findings, and initial radiographic and laboratory data in the context of their medical condition is felt to place them at decreased risk for further clinical deterioration. Furthermore, it is anticipated that the patient will be medically stable for discharge from the hospital within 2 midnights of admission.        Date of Service 05/29/2024    Caleb Exon [facility]   If 7PM-7AM, please contact night-coverage [email or web address] 05/29/2024, 1:07 AM     [1] No Known Allergies  "

## 2024-05-28 NOTE — ED Triage Notes (Signed)
 Pt c/o SOB. Pt says this started last night. He was discharged from SNF yesterday and was on oxygen during his admission. Pt says they sent him home with an empty oxygen tank. Pt has hx of COPD, CHF, Afib, AAA. Pt says he feels it is difficult to take a deep breath. Pt is able to speak in full conversations in triage without increased work of breathing.

## 2024-05-29 ENCOUNTER — Encounter: Payer: Self-pay | Admitting: Internal Medicine

## 2024-05-29 DIAGNOSIS — Z6835 Body mass index (BMI) 35.0-35.9, adult: Secondary | ICD-10-CM | POA: Diagnosis not present

## 2024-05-29 DIAGNOSIS — I5022 Chronic systolic (congestive) heart failure: Secondary | ICD-10-CM | POA: Diagnosis present

## 2024-05-29 DIAGNOSIS — E785 Hyperlipidemia, unspecified: Secondary | ICD-10-CM | POA: Diagnosis present

## 2024-05-29 DIAGNOSIS — F1010 Alcohol abuse, uncomplicated: Secondary | ICD-10-CM | POA: Diagnosis present

## 2024-05-29 DIAGNOSIS — I428 Other cardiomyopathies: Secondary | ICD-10-CM | POA: Diagnosis present

## 2024-05-29 DIAGNOSIS — Z9981 Dependence on supplemental oxygen: Secondary | ICD-10-CM | POA: Diagnosis not present

## 2024-05-29 DIAGNOSIS — Z7901 Long term (current) use of anticoagulants: Secondary | ICD-10-CM | POA: Diagnosis not present

## 2024-05-29 DIAGNOSIS — E1169 Type 2 diabetes mellitus with other specified complication: Secondary | ICD-10-CM | POA: Diagnosis present

## 2024-05-29 DIAGNOSIS — Z8249 Family history of ischemic heart disease and other diseases of the circulatory system: Secondary | ICD-10-CM | POA: Diagnosis not present

## 2024-05-29 DIAGNOSIS — I2721 Secondary pulmonary arterial hypertension: Secondary | ICD-10-CM | POA: Diagnosis present

## 2024-05-29 DIAGNOSIS — Z1152 Encounter for screening for COVID-19: Secondary | ICD-10-CM | POA: Diagnosis not present

## 2024-05-29 DIAGNOSIS — Z833 Family history of diabetes mellitus: Secondary | ICD-10-CM | POA: Diagnosis not present

## 2024-05-29 DIAGNOSIS — I2510 Atherosclerotic heart disease of native coronary artery without angina pectoris: Secondary | ICD-10-CM | POA: Diagnosis present

## 2024-05-29 DIAGNOSIS — E876 Hypokalemia: Secondary | ICD-10-CM | POA: Diagnosis present

## 2024-05-29 DIAGNOSIS — I110 Hypertensive heart disease with heart failure: Secondary | ICD-10-CM | POA: Diagnosis present

## 2024-05-29 DIAGNOSIS — I4821 Permanent atrial fibrillation: Secondary | ICD-10-CM | POA: Diagnosis present

## 2024-05-29 DIAGNOSIS — Z79899 Other long term (current) drug therapy: Secondary | ICD-10-CM | POA: Diagnosis not present

## 2024-05-29 DIAGNOSIS — Z5982 Transportation insecurity: Secondary | ICD-10-CM | POA: Diagnosis not present

## 2024-05-29 DIAGNOSIS — J439 Emphysema, unspecified: Secondary | ICD-10-CM | POA: Diagnosis present

## 2024-05-29 DIAGNOSIS — J4489 Other specified chronic obstructive pulmonary disease: Secondary | ICD-10-CM | POA: Diagnosis present

## 2024-05-29 DIAGNOSIS — I4891 Unspecified atrial fibrillation: Secondary | ICD-10-CM | POA: Diagnosis not present

## 2024-05-29 DIAGNOSIS — R0602 Shortness of breath: Secondary | ICD-10-CM | POA: Diagnosis present

## 2024-05-29 DIAGNOSIS — E0590 Thyrotoxicosis, unspecified without thyrotoxic crisis or storm: Secondary | ICD-10-CM | POA: Diagnosis present

## 2024-05-29 DIAGNOSIS — E66812 Obesity, class 2: Secondary | ICD-10-CM | POA: Diagnosis present

## 2024-05-29 DIAGNOSIS — I272 Pulmonary hypertension, unspecified: Secondary | ICD-10-CM | POA: Diagnosis not present

## 2024-05-29 DIAGNOSIS — Z7984 Long term (current) use of oral hypoglycemic drugs: Secondary | ICD-10-CM | POA: Diagnosis not present

## 2024-05-29 LAB — BASIC METABOLIC PANEL WITH GFR
Anion gap: 12 (ref 5–15)
BUN: 8 mg/dL (ref 8–23)
CO2: 26 mmol/L (ref 22–32)
Calcium: 8.9 mg/dL (ref 8.9–10.3)
Chloride: 94 mmol/L — ABNORMAL LOW (ref 98–111)
Creatinine, Ser: 0.76 mg/dL (ref 0.61–1.24)
GFR, Estimated: >60 mL/min
Glucose, Bld: 123 mg/dL — ABNORMAL HIGH (ref 70–99)
Potassium: 3.2 mmol/L — ABNORMAL LOW (ref 3.5–5.1)
Sodium: 133 mmol/L — ABNORMAL LOW (ref 135–145)

## 2024-05-29 LAB — GLUCOSE, CAPILLARY: Glucose-Capillary: 117 mg/dL — ABNORMAL HIGH (ref 70–99)

## 2024-05-29 LAB — CBC
HCT: 37.1 % — ABNORMAL LOW (ref 39.0–52.0)
Hemoglobin: 12.2 g/dL — ABNORMAL LOW (ref 13.0–17.0)
MCH: 27.4 pg (ref 26.0–34.0)
MCHC: 32.9 g/dL (ref 30.0–36.0)
MCV: 83.2 fL (ref 80.0–100.0)
Platelets: 182 K/uL (ref 150–400)
RBC: 4.46 MIL/uL (ref 4.22–5.81)
RDW: 15.9 % — ABNORMAL HIGH (ref 11.5–15.5)
WBC: 5.5 K/uL (ref 4.0–10.5)
nRBC: 0 % (ref 0.0–0.2)

## 2024-05-29 LAB — TSH: TSH: 1.65 u[IU]/mL (ref 0.350–4.500)

## 2024-05-29 LAB — T4, FREE: Free T4: 1.58 ng/dL (ref 0.80–2.00)

## 2024-05-29 LAB — CBG MONITORING, ED
Glucose-Capillary: 119 mg/dL — ABNORMAL HIGH (ref 70–99)
Glucose-Capillary: 127 mg/dL — ABNORMAL HIGH (ref 70–99)
Glucose-Capillary: 139 mg/dL — ABNORMAL HIGH (ref 70–99)

## 2024-05-29 LAB — MAGNESIUM: Magnesium: 1.7 mg/dL (ref 1.7–2.4)

## 2024-05-29 MED ORDER — FUROSEMIDE 40 MG PO TABS
40.0000 mg | ORAL_TABLET | Freq: Every day | ORAL | Status: DC
  Administered 2024-05-30 – 2024-06-01 (×3): 40 mg via ORAL
  Filled 2024-05-29 (×3): qty 1

## 2024-05-29 MED ORDER — IPRATROPIUM-ALBUTEROL 0.5-2.5 (3) MG/3ML IN SOLN
3.0000 mL | Freq: Four times a day (QID) | RESPIRATORY_TRACT | Status: DC
  Administered 2024-05-29 – 2024-05-30 (×4): 3 mL via RESPIRATORY_TRACT
  Filled 2024-05-29 (×4): qty 3

## 2024-05-29 MED ORDER — METOPROLOL SUCCINATE ER 50 MG PO TB24
50.0000 mg | ORAL_TABLET | Freq: Every day | ORAL | Status: DC
  Administered 2024-05-29: 50 mg via ORAL
  Filled 2024-05-29: qty 1

## 2024-05-29 MED ORDER — SACUBITRIL-VALSARTAN 49-51 MG PO TABS: 1.0000 | ORAL_TABLET | Freq: Two times a day (BID) | ORAL | Status: DC

## 2024-05-29 MED ORDER — AMIODARONE HCL 200 MG PO TABS
200.0000 mg | ORAL_TABLET | Freq: Every day | ORAL | Status: DC
  Filled 2024-05-29: qty 1

## 2024-05-29 MED ORDER — PREDNISONE 20 MG PO TABS
40.0000 mg | ORAL_TABLET | Freq: Every day | ORAL | Status: DC
  Administered 2024-05-30: 40 mg via ORAL
  Filled 2024-05-29: qty 2

## 2024-05-29 MED ORDER — NITROGLYCERIN 0.4 MG SL SUBL: 0.4000 mg | SUBLINGUAL_TABLET | SUBLINGUAL | Status: DC | PRN

## 2024-05-29 MED ORDER — OXYCODONE HCL 5 MG PO TABS
10.0000 mg | ORAL_TABLET | ORAL | Status: DC | PRN
  Administered 2024-05-29 (×2): 10 mg via ORAL
  Filled 2024-05-29 (×2): qty 2

## 2024-05-29 MED ORDER — AMLODIPINE BESYLATE 5 MG PO TABS
5.0000 mg | ORAL_TABLET | Freq: Every day | ORAL | Status: DC
  Administered 2024-05-29: 5 mg via ORAL
  Filled 2024-05-29: qty 1

## 2024-05-29 MED ORDER — UMECLIDINIUM BROMIDE 62.5 MCG/ACT IN AEPB: 1.0000 | INHALATION_SPRAY | Freq: Every day | RESPIRATORY_TRACT | Status: DC

## 2024-05-29 MED ORDER — METHIMAZOLE 10 MG PO TABS
20.0000 mg | ORAL_TABLET | Freq: Two times a day (BID) | ORAL | Status: DC
  Administered 2024-05-29 – 2024-05-30 (×3): 20 mg via ORAL
  Filled 2024-05-29 (×5): qty 2

## 2024-05-29 MED ORDER — APIXABAN 5 MG PO TABS
5.0000 mg | ORAL_TABLET | Freq: Two times a day (BID) | ORAL | Status: DC
  Administered 2024-05-29 – 2024-06-01 (×8): 5 mg via ORAL
  Filled 2024-05-29 (×8): qty 1

## 2024-05-29 MED ORDER — EMPAGLIFLOZIN 10 MG PO TABS
10.0000 mg | ORAL_TABLET | Freq: Every day | ORAL | Status: DC
  Administered 2024-05-29 – 2024-06-01 (×4): 10 mg via ORAL
  Filled 2024-05-29 (×4): qty 1

## 2024-05-29 MED ORDER — DIGOXIN 125 MCG PO TABS
62.5000 ug | ORAL_TABLET | Freq: Every day | ORAL | Status: DC
  Administered 2024-05-29 – 2024-06-01 (×4): 62.5 ug via ORAL
  Filled 2024-05-29 (×4): qty 0.5

## 2024-05-29 MED ORDER — ARFORMOTEROL TARTRATE 15 MCG/2ML IN NEBU: 15.0000 ug | INHALATION_SOLUTION | Freq: Two times a day (BID) | RESPIRATORY_TRACT | Status: DC

## 2024-05-29 MED ORDER — SACUBITRIL-VALSARTAN 49-51 MG PO TABS
1.0000 | ORAL_TABLET | Freq: Two times a day (BID) | ORAL | Status: DC
  Administered 2024-05-29 – 2024-06-01 (×8): 1 via ORAL
  Filled 2024-05-29 (×9): qty 1

## 2024-05-29 MED ORDER — ALBUTEROL SULFATE (2.5 MG/3ML) 0.083% IN NEBU: 2.5000 mg | INHALATION_SOLUTION | RESPIRATORY_TRACT | Status: DC | PRN

## 2024-05-29 MED ORDER — OXYCODONE HCL 5 MG PO TABS
5.0000 mg | ORAL_TABLET | ORAL | Status: DC | PRN
  Administered 2024-05-29 – 2024-06-01 (×11): 5 mg via ORAL
  Filled 2024-05-29 (×11): qty 1

## 2024-05-29 MED ORDER — POTASSIUM CHLORIDE 20 MEQ PO PACK
60.0000 meq | PACK | Freq: Once | ORAL | Status: AC
  Administered 2024-05-29: 60 meq via ORAL
  Filled 2024-05-29: qty 3

## 2024-05-29 MED ORDER — MAGNESIUM SULFATE 2 GM/50ML IV SOLN
2.0000 g | Freq: Once | INTRAVENOUS | Status: AC
  Administered 2024-05-29: 2 g via INTRAVENOUS
  Filled 2024-05-29: qty 50

## 2024-05-29 MED ORDER — METOPROLOL SUCCINATE ER 50 MG PO TB24
50.0000 mg | ORAL_TABLET | Freq: Two times a day (BID) | ORAL | Status: DC
  Administered 2024-05-29 – 2024-06-01 (×6): 50 mg via ORAL
  Filled 2024-05-29 (×6): qty 1

## 2024-05-29 MED ORDER — SEMAGLUTIDE 7 MG PO TABS: 7.0000 mg | ORAL_TABLET | Freq: Every day | ORAL | Status: DC

## 2024-05-29 MED ORDER — ATORVASTATIN CALCIUM 20 MG PO TABS
40.0000 mg | ORAL_TABLET | Freq: Every day | ORAL | Status: DC
  Administered 2024-05-29 – 2024-06-01 (×4): 40 mg via ORAL
  Filled 2024-05-29 (×4): qty 2

## 2024-05-29 MED ORDER — PANTOPRAZOLE SODIUM 40 MG PO TBEC
40.0000 mg | DELAYED_RELEASE_TABLET | Freq: Two times a day (BID) | ORAL | Status: DC
  Administered 2024-05-29 – 2024-06-01 (×8): 40 mg via ORAL
  Filled 2024-05-29 (×8): qty 1

## 2024-05-29 MED ORDER — SPIRONOLACTONE 25 MG PO TABS
25.0000 mg | ORAL_TABLET | Freq: Every day | ORAL | Status: DC
  Administered 2024-05-29 – 2024-06-01 (×4): 25 mg via ORAL
  Filled 2024-05-29 (×4): qty 1

## 2024-05-29 MED ORDER — DICLOFENAC SODIUM 1 % EX GEL
2.0000 g | Freq: Two times a day (BID) | CUTANEOUS | Status: DC | PRN
  Filled 2024-05-29: qty 100

## 2024-05-29 NOTE — Progress Notes (Signed)
 Heart Failure Nurse Navigator Progress Note  PCP: Vicci Duwaine SQUIBB, DO PCP-Cardiologist: Deatrice Cage, MD Electrophysiologist:Cameron ONEIDA Holts, MD Admission Diagnosis: Shortness of breath Atrial fibrillation with rapid ventricular response (HCC) Elevated brain natriuretic peptide (BNP) level Admitted from: Home.  Had been discharged from SNF on 05/27/2024  Presentation:   Johsua Henderson is a 73 y.o. male who presented with shortness of breath. He was discharged the day before from [facility] and was on 3L O2 but was sent home with an empty oxygen tank.  Patient started to feel more short of breath and had a mild cough.  Was also having some mild mid- sternal chest pain that was non radiating.   He has a history of COPD, CHF, a-fib ion digoxin  and Eliquis .  BP was 147/69, HR 137.  Pro-BNP 1,026.  HS-Troponin 21. Chest x-ray: No active cardiopulmonary disease. Cardiomegaly.  ECHO/ LVEF: 40-45% -on 04/26/24  Clinical Course:  Past Medical History:  Diagnosis Date   Arthritis    Ascending aortic aneurysm    a. 09/2017 Stable TAA - 5.1cm.   Asthma    Chronic systolic CHF (congestive heart failure) (HCC)    a. EF 25-30% by echo in 07/2016 with cath showing no significant CAD b. 01/2017: EF 30-35% with diffuse HK and moderate MR; c. 07/2017 Echo: EF 30-35%, diff hK. Mild MR, mildly dil LA. PASP .   Diverticulitis    Diverticulitis of large intestine with perforation without abscess or bleeding 05/13/2017   Hypertension    Hyperthyroidism    NICM (nonischemic cardiomyopathy) (HCC)    Noncompliance    Persistent atrial fibrillation (HCC)    a. CHA2DS2VASc = 3-->Eliquis  (? compliance).     Social History   Socioeconomic History   Marital status: Single    Spouse name: Not on file   Number of children: Not on file   Years of education: Not on file   Highest education level: Not on file  Occupational History   Occupation: retired  Tobacco Use   Smoking status:  Some Days    Current packs/day: 0.00    Types: Pipe, Cigarettes    Last attempt to quit: 12/27/2022    Years since quitting: 1.4   Smokeless tobacco: Never  Vaping Use   Vaping status: Never Used  Substance and Sexual Activity   Alcohol use: Yes    Alcohol/week: 2.0 standard drinks of alcohol    Types: 2 Cans of beer per week    Comment: 2 40oz beers/week   Drug use: No   Sexual activity: Not Currently  Other Topics Concern   Not on file  Social History Narrative   Not on file   Social Drivers of Health   Tobacco Use: High Risk (05/24/2024)   Received from [facility]   Patient History    Smoking Tobacco Use: Every Day    Smokeless Tobacco Use: Never    Passive Exposure: Not on file  Financial Resource Strain: Low Risk (03/27/2024)   Overall Financial Resource Strain (CARDIA)    Difficulty of Paying Living Expenses: Not very hard  Food Insecurity: No Food Insecurity (05/02/2024)   Epic    Worried About Radiation Protection Practitioner of Food in the Last Year: Never true    Ran Out of Food in the Last Year: Never true  Transportation Needs: No Transportation Needs (05/02/2024)   Epic    Lack of Transportation (Medical): No    Lack of Transportation (Non-Medical): No  Recent Concern: Transportation  Needs - Unmet Transportation Needs (03/27/2024)   Epic    Lack of Transportation (Medical): Yes    Lack of Transportation (Non-Medical): Yes  Physical Activity: Inactive (07/27/2022)   Exercise Vital Sign    Days of Exercise per Week: 0 days    Minutes of Exercise per Session: 0 min  Stress: No Stress Concern Present (07/27/2022)   [employer] of Occupational Health - Occupational Stress Questionnaire    Feeling of Stress : Not at all  Social Connections: Socially Isolated (05/02/2024)   Social Connection and Isolation Panel    Frequency of Communication with Friends and Family: More than three times a week    Frequency of Social Gatherings with Friends and Family: More  than three times a week    Attends Religious Services: Never    Database Administrator or Organizations: No    Attends Banker Meetings: Never    Marital Status: Divorced  Depression (PHQ2-9): Medium Risk (04/16/2024)   Depression (PHQ2-9)    PHQ-2 Score: 6  Alcohol Screen: Low Risk (07/27/2022)   Alcohol Screen    Last Alcohol Screening Score (AUDIT): 2  Housing: Unknown (05/24/2024)   Received from [facility]   Epic    Unable to Pay for Housing in the Last Year: Not on file    Number of Times Moved in the Last Year: Not on file    At any time in the past 12 months, were you homeless or living in a shelter (including now)?: No  Utilities: Not At Risk (05/02/2024)   Epic    Threatened with loss of utilities: No  Health Literacy: Not on file   Education Assessment and Provision:  Detailed education and instructions provided on heart failure disease management including the following:  Signs and symptoms of Heart Failure When to call the physician Importance of daily weights Low sodium diet Fluid restriction Medication management Anticipated future follow-up appointments  Patient education given on each of the above topics.  Patient acknowledges understanding via teach back method and acceptance of all instructions.  Education Materials:  Living Better With Heart Failure Booklet, HF zone tool, & Daily Weight Tracker Tool.  Patient has scale at home: Yes Patient has pill box at home: Yes.     High Risk Criteria for Readmission and/or Poor Patient Outcomes: Heart failure hospital admissions (last 6 months): 2  No Show rate: 28% Difficult social situation: None determined at this time Demonstrates medication adherence: Yes Primary Language: English Literacy level: Reading, Writing & Comprehension  Barriers of Care:   Daily Weights Diet & Fluid Restrictions  Considerations/Referrals:  Referral made to Heart Failure Pharmacist  Stewardship: Yes Referral made to Heart Failure CSW/NCM TOC: No Referral made to Heart & Vascular TOC clinic: Yes. [facility] 06/21/24 @ 2:30 PM  Items for Follow-up on DC/TOC: Daily Weights Diet & Fluid Restrictions Continued Heart Failure Education  Charmaine Pines, RN, BSN [facility] Heart Failure Navigator Secure Chat Only

## 2024-05-29 NOTE — Evaluation (Signed)
 Occupational Therapy Evaluation Patient Details Name: Eugene Henderson MRN: [id number] DOB: 10/13/1950 Today's Date: 05/29/2024   History of Present Illness   73 y.o. 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 Kandis.     Clinical Impressions Patient presenting with decreased Ind in self care,balance, functional mobility, transfers,endurance, and safety awareness. Patient reports being Mod I at home and living with daughter PTA.Pt with recent SNF and rehab stay. He was home for less than 1 day before returning to hospital secondary to feeling SOB and having issues with oxygen supply. Pt on RA during session with O2 saturation being above 90% throughout session. Pt reports when he returned from rehab that he was ambulating with quad cane at home and needing assistance with self care secondary to L UE sling use of precautions. He states daughter reports she does not want to assist him with these tasks so he may not have assistance he thought he previously had in regards to returning home.  Patient needing min A for bed mobility and assistance to adjust L UE sling to proper position. Pt stands and ambulates several steps in room with cane with min guard. Pt's HR increases to 130's with activity. Pt returns to sitting on EOB with RN present in room to give medications. Concern is really for if pt has assistance at discharge or not as precautions/restrictions remain. Patient will benefit from acute OT to increase overall independence in the areas of ADLs, functional mobility, ans safety awareness in order to safely discharge.     If plan is discharge home, recommend the following:   A little help with walking and/or transfers;A little help with  bathing/dressing/bathroom;Assistance with cooking/housework;Direct supervision/assist for financial management;Help with stairs or ramp for entrance;Assist for transportation;Direct supervision/assist for medications management     Functional Status Assessment   Patient has had a recent decline in their functional status and demonstrates the ability to make significant improvements in function in a reasonable and predictable amount of time.     Equipment Recommendations   Other (comment) (defer)     Recommendations for Other Services         Precautions/Restrictions   Precautions Precautions: Fall Required Braces or Orthoses: Splint/Cast;Sling Splint/Cast: L thumb spica and L arm sling Restrictions Weight Bearing Restrictions Per Provider Order: Yes LUE Weight Bearing Per Provider Order: Non weight bearing Other Position/Activity Restrictions: Left wrist spica splint after x-ray revealed severe arthritis. Ptto be NWB through L UE. Pt with nondisplaced radial head fracture.     Mobility Bed Mobility Overal bed mobility: Needs Assistance Bed Mobility: Supine to Sit     Supine to sit: Min assist          Transfers Overall transfer level: Needs assistance Equipment used: Quad cane Transfers: Sit to/from Stand Sit to Stand: Min assist                  Balance Overall balance assessment: Needs assistance Sitting-balance support: Feet supported, Single extremity supported Sitting balance-Leahy Scale: Good     Standing balance support: Single extremity supported, During functional activity, Reliant on assistive device for balance Standing balance-Leahy Scale: Fair  ADL either performed or assessed with clinical judgement   ADL Overall ADL's : Needs assistance/impaired                                       General ADL Comments: Pt needs assistance to pull pants over B hips and to don UB clothing  secondary to L UE precautions     Vision Patient Visual Report: No change from baseline       Perception         Praxis         Pertinent Vitals/Pain Pain Assessment Pain Assessment: Faces Faces Pain Scale: Hurts even more Pain Location: L UE Pain Descriptors / Indicators: Aching, Discomfort, Guarding Pain Intervention(s): Limited activity within patient's tolerance, Monitored during session, Repositioned     Extremity/Trunk Assessment Upper Extremity Assessment Upper Extremity Assessment: LUE deficits/detail LUE: Unable to fully assess due to pain;Unable to fully assess due to immobilization   Lower Extremity Assessment Lower Extremity Assessment: Generalized weakness       Communication Communication Communication: No apparent difficulties   Cognition Arousal: Alert Behavior During Therapy: WFL for tasks assessed/performed Cognition: History of cognitive impairments             OT - Cognition Comments: baseline dementia                 Following commands: Impaired Following commands impaired: Follows one step commands with increased time     Cueing  General Comments   Cueing Techniques: Verbal cues      Exercises     Shoulder Instructions      Home Living Family/patient expects to be discharged to:: Private residence Living Arrangements: Children Available Help at Discharge: Family;Available PRN/intermittently Type of Home: House Home Access: Stairs to enter [employer] of Steps: 4 Entrance Stairs-Rails: Right;Left;Can reach both Home Layout: One level     Bathroom Shower/Tub: Chief Strategy Officer: Standard     Home Equipment: Cane - single point          Prior Functioning/Environment Prior Level of Function : Needs assist             Mobility Comments: Pt reports sedentary lifestyle, would sit in recliner daily and use urinal; limited household ambulation for toileting purposes.  Denies fall  history. No home O2. ADLs Comments: Daughter would help with shower transfer; he reports being mostly modified independent for dressing; daughter did all cooking/cleaning; not driving;    OT Problem List: Decreased strength;Decreased coordination;Pain;Cardiopulmonary status limiting activity;Decreased range of motion;Decreased activity tolerance;Impaired balance (sitting and/or standing);Decreased knowledge of use of DME or AE;Obesity;Impaired UE functional use   OT Treatment/Interventions: Self-care/ADL training;Therapeutic exercise;Therapeutic activities;Splinting;DME and/or AE instruction;Visual/perceptual remediation/compensation;Patient/family education;Balance training;Manual therapy;Modalities      OT Goals(Current goals can be found in the care plan section)   Acute Rehab OT Goals Patient Stated Goal: to go home OT Goal Formulation: With patient Time For Goal Achievement: 06/12/24 Potential to Achieve Goals: Fair ADL Goals Pt Will Perform Grooming: with supervision;standing Pt Will Perform Lower Body Dressing: with min assist;sit to/from stand Pt Will Transfer to Toilet: bedside commode;with supervision;ambulating Pt Will Perform Toileting - Clothing Manipulation and hygiene: with contact guard assist;sit to/from stand   OT Frequency:  Min 2X/week    Co-evaluation              AM-PAC OT 6 Clicks Daily Activity  Outcome Measure Help from another person eating meals?: A Little Help from another person taking care of personal grooming?: A Little Help from another person toileting, which includes using toliet, bedpan, or urinal?: A Lot Help from another person bathing (including washing, rinsing, drying)?: A Lot Help from another person to put on and taking off regular upper body clothing?: A Lot Help from another person to put on and taking off regular lower body clothing?: Total 6 Click Score: 13   End of Session Equipment Utilized During Treatment: Gait  belt Nurse Communication: Mobility status  Activity Tolerance: Patient limited by pain Patient left: in chair;with call bell/phone within reach;with chair alarm set  OT Visit Diagnosis: Other abnormalities of gait and mobility (R26.89);Muscle weakness (generalized) (M62.81);Pain Pain - Right/Left: Left Pain - part of body: Shoulder;Arm                Time: 0950-1006 OT Time Calculation (min): 16 min Charges:  OT General Charges $OT Visit: 1 Visit OT Evaluation $OT Eval Low Complexity: 1 Low  Izetta Claude, MS, OTR/L , CBIS ascom [phone]  05/29/2024, 1:43 PM

## 2024-05-29 NOTE — Evaluation (Signed)
 Physical Therapy Evaluation Patient Details Name: Eugene Henderson MRN: [id number] DOB: 03/12/1951 Today's Date: 05/29/2024  History of Present Illness  Pt is a 73 y/o M admitted on 05/28/24 after presenting with c/o SOB. Pt with recent hospitalization 2/2 a-fib with RVR, COPD & CHF exacerbation, along with nondisplaced fx of radial head of L shoulder. Pt is being treated for a-fib with RVR, CHF. PMH: a-fib on Eliquis , sCHF, HTN, HLD, DM, COPD, tobacco abuse, obesity, hyperthyroidism, medication noncompliance  Clinical Impression  Pt seen for PT evaluation with pt agreeable to tx. Pt reports he was recently in rehab & had just returned home. Pt reports he's ambulatory with hurrycane, notes 1 fall in the past 6 years, resides with daughter. Contacted daughter via telephone who reports she's unable to provide physical assistance as she's physically disabled herself & will be out of town until next weekend. On this date, pt is able to ambulate in room with cane & CGA. Recommend ongoing PT services to progress mobility. Recommend post acute rehab <3 hours therapy/day upon d/c.        If plan is discharge home, recommend the following: A little help with walking and/or transfers;A little help with bathing/dressing/bathroom;Assistance with cooking/housework;Assist for transportation;Help with stairs or ramp for entrance   Can travel by private vehicle   Yes    Equipment Recommendations None recommended by PT  Recommendations for Other Services  Rehab consult    Functional Status Assessment Patient has had a recent decline in their functional status and demonstrates the ability to make significant improvements in function in a reasonable and predictable amount of time.     Precautions / Restrictions Precautions Precautions: Fall Required Braces or Orthoses: Splint/Cast;Sling Splint/Cast: L thumb spica and L arm sling Restrictions Weight Bearing Restrictions Per Provider Order: Yes LUE Weight  Bearing Per Provider Order: Non weight bearing Other Position/Activity Restrictions: Left wrist spica splint after x-ray revealed severe arthritis. Pt to be NWB through L UE. Pt with nondisplaced radial head fracture.      Mobility  Bed Mobility Overal bed mobility: Needs Assistance Bed Mobility: Supine to Sit, Sit to Supine     Supine to sit: HOB elevated, Used rails, Supervision Sit to supine: HOB elevated, Used rails, Supervision   General bed mobility comments: exit L side of bed, extra time to complete bed mobility, use of bed rails, HOB elevated    Transfers Overall transfer level: Needs assistance Equipment used:  (hurry cane) Transfers: Sit to/from Stand Sit to Stand: Contact guard assist           General transfer comment: sit>stand from EOB, slightly elevated    Ambulation/Gait Ambulation/Gait assistance: Contact guard assist Gait Distance (Feet): 20 Feet (pt elected to perform laps in room vs ambulating in hallway) Assistive device:  (hurry cane) Gait Pattern/deviations: Wide base of support, Decreased step length - left, Decreased step length - right, Decreased stride length Gait velocity: decreased     General Gait Details: 1 mild LOB with slight min assist to correct  Stairs            Wheelchair Mobility     Tilt Bed    Modified Rankin (Stroke Patients Only)       Balance Overall balance assessment: Needs assistance Sitting-balance support: Feet supported, Single extremity supported Sitting balance-Leahy Scale: Good     Standing balance support: Single extremity supported, During functional activity, Reliant on assistive device for balance Standing balance-Leahy Scale: Fair  Pertinent Vitals/Pain Pain Assessment Pain Assessment: Faces Faces Pain Scale: Hurts even more Pain Location: L UE Pain Descriptors / Indicators: Aching, Discomfort, Guarding Pain Intervention(s): Monitored during  session, Repositioned (adjusted sling)    Home Living Family/patient expects to be discharged to:: Private residence Living Arrangements: Children Available Help at Discharge: Family;Available PRN/intermittently (daughter on disability herself so cannot assist pt) Type of Home: House Home Access: Stairs to enter Entrance Stairs-Rails: Right;Left;Can reach both Entrance Stairs-Number of Steps: 4   Home Layout: One level Home Equipment:  (hurry cane, hemiwalker)      Prior Function Prior Level of Function : Needs assist             Mobility Comments: Pt recently at rehab & returned home. Ambulatory with hurry cane, reports 1 fall in the past 6 years. ADLs Comments: Daughter would help with shower transfer; he reports being mostly modified independent for dressing; daughter did all cooking/cleaning; not driving;     Extremity/Trunk Assessment   Upper Extremity Assessment Upper Extremity Assessment: LUE deficits/detail LUE Deficits / Details: LUE in sling, spica splint LUE: Unable to fully assess due to pain;Unable to fully assess due to immobilization    Lower Extremity Assessment Lower Extremity Assessment: Generalized weakness;Overall WFL for tasks assessed       Communication   Communication Communication: No apparent difficulties    Cognition Arousal: Alert Behavior During Therapy: WFL for tasks assessed/performed   PT - Cognitive impairments: No apparent impairments                       PT - Cognition Comments: very pleasant Following commands: Intact Following commands impaired: Follows multi-step commands with increased time     Cueing Cueing Techniques: Verbal cues     General Comments General comments (skin integrity, edema, etc.): max HR 153 bpm, no c/o symptoms, difficulty obtaining good SpO2 Pleth waveform on cold finger so placed monitor on ear, SpO2 > 90% on room air.    Exercises Other Exercises Other Exercises: Assisted pt with meal  tray set up at end of session.   Assessment/Plan    PT Assessment Patient needs continued PT services  PT Problem List Decreased strength;Pain;Decreased activity tolerance;Decreased balance;Decreased mobility       PT Treatment Interventions DME instruction;Therapeutic exercise;Balance training;Gait training;Stair training;Neuromuscular re-education;Functional mobility training;Therapeutic activities;Patient/family education    PT Goals (Current goals can be found in the Care Plan section)  Acute Rehab PT Goals Patient Stated Goal: get x-rays of his arm PT Goal Formulation: With patient Time For Goal Achievement: 06/12/24 Potential to Achieve Goals: Good    Frequency Min 2X/week     Co-evaluation               AM-PAC PT 6 Clicks Mobility  Outcome Measure Help needed turning from your back to your side while in a flat bed without using bedrails?: None Help needed moving from lying on your back to sitting on the side of a flat bed without using bedrails?: A Little Help needed moving to and from a bed to a chair (including a wheelchair)?: A Little Help needed standing up from a chair using your arms (e.g., wheelchair or bedside chair)?: A Little Help needed to walk in hospital room?: A Little Help needed climbing 3-5 steps with a railing? : A Lot 6 Click Score: 18    End of Session   Activity Tolerance: Patient tolerated treatment well Patient left: in bed;with call bell/phone within reach  PT Visit Diagnosis: Unsteadiness on feet (R26.81);Muscle weakness (generalized) (M62.81)    Time: 8679-8660 PT Time Calculation (min) (ACUTE ONLY): 19 min   Charges:   PT Evaluation $PT Eval Low Complexity: 1 Low   PT General Charges $$ ACUTE PT VISIT: 1 Visit         Richerd Pinal, PT, DPT 05/29/2024, 1:52 PM   Richerd CHRISTELLA Pinal 05/29/2024, 1:51 PM

## 2024-05-29 NOTE — [place] FL2 (Signed)
 " [place]  MEDICAID FL2 LEVEL OF CARE FORM     IDENTIFICATION  Patient Name: Eugene Henderson Birthdate: 1950/12/18 Sex: male Admission Date (Current Location): 05/28/2024  [facility] and [place] Number:  Chiropodist and Address:  [facility], [address] [place], [place] [address]      Provider Number: [id number]  Attending Physician Name and Address:  Kandis Devaughn Sayres, MD  Relative Name and Phone Number:  [facility]  Daughter, Emergency Contact  [phone]    Current Level of Care: Hospital Recommended Level of Care: Skilled Nursing Facility Prior Approval Number:    Date Approved/Denied:   PASRR Number: [id number]  Discharge Plan: SNF    Current Diagnoses: Patient Active Problem List   Diagnosis Date Noted   Atrial fibrillation with RVR (HCC) 05/28/2024   Obesity (BMI 30-39.9) 05/28/2024   Chronic systolic CHF (congestive heart failure) (HCC) 05/28/2024   Acute on chronic congestive heart failure (HCC) 05/05/2024   A-fib (HCC) 05/02/2024   Atrial fibrillation with rapid ventricular response (HCC) 05/01/2024   Postural dizziness with presyncope 04/13/2024   Orthostatic hypotension 04/13/2024   Urinary tract infection 01/04/2024   Abnormal stress test 12/16/2023   NICM (nonischemic cardiomyopathy) (HCC) 06/16/2022   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   Metal foreign body in head 06/14/2022   Pain in joint of right knee 07/27/2021   Post-traumatic osteoarthritis 07/27/2021   Aortic atherosclerosis 08/27/2020   Acute systolic heart failure (HCC)    AF (paroxysmal atrial fibrillation) (HCC)    Hyperlipidemia    Morbid obesity (HCC) 12/11/2018   Chronic pain of right knee 12/11/2018   Advance directive discussed with patient 05/16/2018   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   Keratitis due to infection 07/06/2017   Corneal ulcer 06/24/2017   COPD (chronic obstructive  pulmonary disease) (HCC) 02/03/2017   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) 12/15/2016   AAA (abdominal aortic aneurysm) 11/29/2016   Chronic low back pain 11/29/2016   Alcohol abuse 11/29/2016   Hyponatremia 11/22/2016   Chronic systolic heart failure (HCC) 08/19/2016   Tobacco use 08/19/2016   Persistent atrial fibrillation (HCC) 07/28/2016   Hyperthyroidism 07/28/2016   Noncompliance with medications 07/28/2016   Essential hypertension 07/28/2016    Orientation RESPIRATION BLADDER Height & Weight     Self, Time, Situation  O2 Continent Weight: 260 lb (117.9 kg) Height:  6' (182.9 cm)  BEHAVIORAL SYMPTOMS/MOOD NEUROLOGICAL BOWEL NUTRITION STATUS      Continent Diet (Diet heart healthy/carb modified Fluid consistency: Thin; Fluid restriction: 1500 mL Fluid starting at 12/22 2202)  AMBULATORY STATUS COMMUNICATION OF NEEDS Skin   Extensive Assist Verbally Normal                       Personal Care Assistance Level of Assistance  Bathing, Feeding, Dressing Bathing Assistance: Limited assistance Feeding assistance: Independent Dressing Assistance: Limited assistance     Functional Limitations Info  Sight, Hearing, Speech Sight Info: Impaired Hearing Info: Impaired Speech Info: Adequate    SPECIAL CARE FACTORS FREQUENCY  PT (By licensed PT), OT (By licensed OT)     PT Frequency: 2x OT Frequency: 2x            Contractures Contractures Info: Not present    Additional Factors Info  Code Status, Allergies Code Status Info: FULL Allergies Info: NKA           Current Medications (05/29/2024):  This is the current hospital active  medication list Current Facility-Administered Medications  Medication Dose Route Frequency Provider Last Rate Last Admin   acetaminophen  (TYLENOL ) tablet 650 mg  650 mg Oral Q6H PRN Niu, Xilin, MD       albuterol  (PROVENTIL ) (2.5 MG/3ML) 0.083% nebulizer solution 3 mL  3 mL Inhalation Q4H PRN Niu, Xilin, MD       apixaban   (ELIQUIS ) tablet 5 mg  5 mg Oral BID Niu, Xilin, MD   5 mg at 05/29/24 9047   atorvastatin  (LIPITOR) tablet 40 mg  40 mg Oral Daily Niu, Xilin, MD   40 mg at 05/29/24 9047   dextromethorphan-guaiFENesin  (MUCINEX  DM) 30-600 MG per 12 hr tablet 1 tablet  1 tablet Oral BID PRN Niu, Xilin, MD       digoxin  (LANOXIN ) tablet 62.5 mcg  62.5 mcg Oral Daily Niu, Xilin, MD   62.5 mcg at 05/29/24 9040   diphenhydrAMINE  (BENADRYL ) injection 12.5 mg  12.5 mg Intravenous Q8H PRN Niu, Xilin, MD       NOREEN ON 05/30/2024] furosemide  (LASIX ) tablet 40 mg  40 mg Oral Daily Dunn, Ryan M, PA-C       hydrALAZINE  (APRESOLINE ) injection 5 mg  5 mg Intravenous Q2H PRN Niu, Xilin, MD       insulin  aspart (novoLOG ) injection 0-5 Units  0-5 Units Subcutaneous QHS Niu, Xilin, MD       insulin  aspart (novoLOG ) injection 0-9 Units  0-9 Units Subcutaneous TID WC Niu, Xilin, MD   1 Units at 05/29/24 1209   ipratropium-albuterol  (DUONEB) 0.5-2.5 (3) MG/3ML nebulizer solution 3 mL  3 mL Nebulization Q6H Niu, Xilin, MD   3 mL at 05/29/24 1011   methimazole  (TAPAZOLE ) tablet 20 mg  20 mg Oral BID Niu, Xilin, MD   20 mg at 05/29/24 9043   metoprolol  succinate (TOPROL -XL) 24 hr tablet 50 mg  50 mg Oral BID Abigail Motto M, PA-C       metoprolol  tartrate (LOPRESSOR ) injection 2.5 mg  2.5 mg Intravenous Q2H PRN Niu, Xilin, MD       nicotine  (NICODERM CQ  - dosed in mg/24 hours) patch 21 mg  21 mg Transdermal Daily Niu, Xilin, MD   21 mg at 05/29/24 1101   nitroGLYCERIN  (NITROSTAT ) SL tablet 0.4 mg  0.4 mg Sublingual Q5 min PRN Niu, Xilin, MD       oxyCODONE  (Oxy IR/ROXICODONE ) immediate release tablet 10 mg  10 mg Oral Q4H PRN Niu, Xilin, MD   10 mg at 05/29/24 1203   pantoprazole  (PROTONIX ) EC tablet 40 mg  40 mg Oral BID Niu, Xilin, MD   40 mg at 05/29/24 9045   sacubitril -valsartan  (ENTRESTO ) 49-51 mg per tablet  1 tablet Oral BID Belue, Nathan S, RPH   1 tablet at 05/29/24 9044   spironolactone  (ALDACTONE ) tablet 25 mg  25 mg Oral Daily  Niu, Xilin, MD   25 mg at 05/29/24 1147   Current Outpatient Medications  Medication Sig Dispense Refill   acetaminophen  (TYLENOL ) 500 MG tablet Take 1-2 tablets (500-1,000 mg total) by mouth every 6 (six) hours as needed for mild pain (pain score 1-3), fever or headache. (Patient taking differently: Take 1,000 mg by mouth 2 (two) times daily.) 800 tablet 4   albuterol  (VENTOLIN  HFA) 108 (90 Base) MCG/ACT inhaler Inhale 1-2 puffs into the lungs every 6 (six) hours as needed for wheezing or shortness of breath. 18 each 6   amiodarone  (PACERONE ) 200 MG tablet Take 200 mg by mouth daily.  amLODipine  (NORVASC ) 5 MG tablet Take 5 mg by mouth daily.     atorvastatin  (LIPITOR) 40 MG tablet TAKE 1 TABLET EVERY DAY 90 tablet 1   cyclobenzaprine  (FLEXERIL ) 5 MG tablet Take 1 tablet (5 mg total) by mouth 3 (three) times daily as needed for muscle spasms.     digoxin  (LANOXIN ) 0.125 MG tablet TAKE 1/2 TABLET BY MOUTH EVERY DAY 90 tablet 2   ELIQUIS  5 MG TABS tablet TAKE 1 TABLET TWICE DAILY 180 tablet 3   furosemide  (LASIX ) 40 MG tablet Take 1 tablet (40 mg total) by mouth daily. 90 tablet 3   irbesartan  (AVAPRO ) 150 MG tablet Take 150 mg by mouth daily.     losartan  (COZAAR ) 25 MG tablet Take 25 mg by mouth daily.     metFORMIN  (GLUCOPHAGE ) 500 MG tablet TAKE 1 TABLET TWICE DAILY WITH MEALS 180 tablet 1   methimazole  (TAPAZOLE ) 10 MG tablet Take 20 mg by mouth 2 (two) times daily.     metoprolol  succinate (TOPROL -XL) 50 MG 24 hr tablet Take 1 tablet (50 mg total) by mouth daily. Reduced from 150 mg.     nitroGLYCERIN  (NITROSTAT ) 0.4 MG SL tablet Place 0.4 mg under the tongue every 5 (five) minutes as needed.     omeprazole (PRILOSEC) 20 MG capsule Take 20 mg by mouth 2 (two) times daily.     oxyCODONE  10 MG TABS Take 1 tablet (10 mg total) by mouth every 4 (four) hours as needed for moderate pain (pain score 4-6), severe pain (pain score 7-10) or breakthrough pain. 15 tablet 0   pantoprazole  (PROTONIX )  40 MG tablet Take 1 tablet (40 mg total) by mouth 2 (two) times daily. 180 tablet 1   polyethylene glycol (MIRALAX  / GLYCOLAX ) 17 g packet Take 17 g by mouth daily as needed for mild constipation or moderate constipation (not helped with stool softner).     Potassium Chloride  ER 20 MEQ TBCR Take 1 tablet by mouth daily.     potassium chloride  SA (KLOR-CON  M) 20 MEQ tablet TAKE 1 TABLET BY MOUTH EVERY DAY 90 tablet 0   predniSONE  (DELTASONE ) 50 MG tablet Take 50 mg by mouth daily with breakfast.     sacubitril -valsartan  (ENTRESTO ) 49-51 MG Take 1 tablet by mouth 2 (two) times daily. 180 tablet 3   Semaglutide  (RYBELSUS ) 7 MG TABS Take 1 tablet (7 mg total) by mouth daily. 90 tablet 0   senna-docusate (SENOKOT-S) 8.6-50 MG tablet Take 1 tablet by mouth at bedtime.     spironolactone  (ALDACTONE ) 25 MG tablet Take 1 tablet (25 mg total) by mouth daily. 90 tablet 1   Tiotropium Bromide-Olodaterol (STIOLTO RESPIMAT ) 2.5-2.5 MCG/ACT AERS INHALE 2 PUFFS BY MOUTH INTO THE LUNGS DAILY 4 g 0   alum & mag hydroxide-simeth (MAALOX/MYLANTA) 200-200-20 MG/5ML suspension Take 30 mLs by mouth every 4 (four) hours as needed for indigestion.     Blood Glucose Monitoring Suppl DEVI 1 each by Does not apply route as directed. Dispense based on patient and insurance preference. Use up to four times daily as directed. (FOR ICD-10 E10.9, E11.9). 1 each 0   Blood Pressure Monitoring (BLOOD PRESSURE CUFF) MISC Check blood pressure as instructed by your physician 1 each 0   diclofenac  Sodium (VOLTAREN ) 1 % GEL Apply 4 g topically 4 (four) times daily as needed (Apply to L wrist, L shoulder as desired for pain). (Patient not taking: Reported on 05/28/2024)     empagliflozin  (JARDIANCE ) 10 MG TABS tablet TAKE  1 TABLET EVERY DAY 90 tablet 1   folic acid  (FOLVITE ) 1 MG tablet Take 1 tablet (1 mg total) by mouth daily. (Patient not taking: Reported on 05/28/2024) 90 tablet 3   Glucose Blood (BLOOD GLUCOSE TEST STRIPS) STRP 1 each  by Does not apply route as directed. Dispense based on patient and insurance preference. Use up to four times daily as directed. (FOR ICD-10 E10.9, E11.9). 100 strip 0   Lancet Device MISC 1 each by Does not apply route as directed. Dispense based on patient and insurance preference. Use up to four times daily as directed. (FOR ICD-10 E10.9, E11.9). 1 each 0   Lancets MISC 1 each by Does not apply route as directed. Dispense based on patient and insurance preference. Use up to four times daily as directed. (FOR ICD-10 E10.9, E11.9). 100 each 0     Discharge Medications: Please see discharge summary for a list of discharge medications.  Relevant Imaging Results:  Relevant Lab Results:   Additional Information [id number]  Eugene Ormond L Hollan Philipp, LCSW     "

## 2024-05-29 NOTE — Progress Notes (Signed)
 " PROGRESS NOTE    Eugene Henderson  FMW:[id number] DOB: December 26, 1950 DOA: 05/28/2024 PCP: Louder Duwaine SQUIBB, DO  Outpatient Specialists: cardiology    Brief Narrative:   From admission h and p   Eugene Henderson is a 73 y.o. male with medical history significant of A fib on Eliquis , sCHF with EF 40-45%, HTN, HLD, DM, COPD, tobacco abuse, obesity, hyperthyroidism, medication noncompliance, who presents with SOB.   Patient was recently hospitalized due to A-fib with RVR, COPD and CHF exacerbation, also had nondisplaced fracture of the lateral aspect of the radial head of left shoulder. Pt was discharged to SNF on 3L of oxygen.  Patient states that he was discharged home yesterday with empty oxygen tank from SNF. He states that he took his medications yesterday but not today. He states that he continues to have dry cough and SOB.  He also reports some mild substernal chest pain.  No fever or chills.  No nausea, vomiting, diarrhea or abdominal pain.  No symptoms of UTI.  Assessment & Plan:   Principal Problem:   Atrial fibrillation with RVR (HCC) Active Problems:   Chronic systolic CHF (congestive heart failure) (HCC)   COPD (chronic obstructive pulmonary disease) (HCC)   Essential hypertension   Hyperlipidemia   Diabetes mellitus with cardiac complication (HCC)   Hyperthyroidism   Tobacco use   Obesity (BMI 30-39.9)   Alcohol abuse   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)  # A-fib rvr Cardiology thinks now permanent and so holding amio. 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 Covid/flu/rsv neg - prednisone  - duonebs  # T2DM Reasonable glucose control - SSI  # HTN Bp low normal today - home arb on hold (need to clarify what taking, med rec says both losartan  and irbesartan )  # COPD No exacerbation - incruse/brovana  for home stioloto  # Hyperthyroid TFTs wnl - cont home methimazole   # Debility Recently completed rehab stay but doesn't feel able to function at home - PT advising snf; TOC consulted for more rehab   DVT prophylaxis: n/a (therapeutic anticoagulation) Code Status: full Family Communication: daughter wendi telephonically 12/23  Level of care: Progressive Status is: Inpatient Remains inpatient appropriate because: severity of illness    Consultants:  cardiology  Procedures: none  Antimicrobials:  none    Subjective: Reports stable dyspnea and stable left elbow and wrist pian  Objective: Vitals:   05/29/24 0958 05/29/24 1021 05/29/24 1403 05/29/24 1536  BP: (!) 128/94 (!) 128/94 121/80 97/72  Pulse: (!) 101 (!) 118 (!) 107 (!) 111  Resp: 18  18 13   Temp:  98 F (36.7 C) 99.2 F (37.3 C) 98.4 F (36.9 C)  TempSrc:  Oral Oral Oral  SpO2: 99%  100% 100%  Weight:      Height:        Intake/Output Summary (Last 24 hours) at 05/29/2024 1554 Last data filed at 05/29/2024 1537 Gross per 24 hour  Intake --  Output 1375 ml  Net -1375 ml   Filed Weights   05/28/24 1710  Weight: 117.9 kg  Examination:  General exam: Appears calm and comfortable  Respiratory system: faint exp wheeze Cardiovascular system: S1 & S2 heard, irreg irreg, no murmur Gastrointestinal system: Abdomen is obese, soft and nontender.   Central nervous system: Alert and oriented. No focal neurological deficits. Extremities: Symmetric 5 x 5 power. Skin: No rashes, lesions or ulcers Psychiatry: animated    Data Reviewed: I have personally reviewed following labs and imaging studies  CBC: Recent Labs  Lab 05/28/24 1727  05/29/24 0703  WBC 8.0 5.5  HGB 12.9* 12.2*  HCT 40.1 37.1*  MCV 83.2 83.2  PLT 210 182   Basic Metabolic Panel: Recent Labs  Lab 05/28/24 1727 05/28/24 1935 05/29/24 0703  NA 130*  --  133*  K 4.2  --  3.2*  CL 92*  --  94*  CO2 19*  --  26  GLUCOSE 152*  --  123*  BUN 8  --  8  CREATININE 0.98  --  0.76  CALCIUM  9.3  --  8.9  MG  --  1.7  --    GFR: Estimated Creatinine Clearance: 109 mL/min (by C-G formula based on SCr of 0.76 mg/dL). Liver Function Tests: No results for input(s): AST, ALT, ALKPHOS, BILITOT, PROT, ALBUMIN in the last 168 hours. No results for input(s): LIPASE, AMYLASE in the last 168 hours. No results for input(s): AMMONIA in the last 168 hours. Coagulation Profile: No results for input(s): INR, PROTIME in the last 168 hours. Cardiac Enzymes: No results for input(s): CKTOTAL, CKMB, CKMBINDEX, TROPONINI in the last 168 hours. BNP (last 3 results) Recent Labs    05/01/24 1257 05/28/24 1727  PROBNP 5,875.0* 1,026.0*   HbA1C: No results for input(s): HGBA1C in the last 72 hours. CBG: Recent Labs  Lab 05/28/24 2256 05/29/24 0837 05/29/24 1158  GLUCAP 116* 119* 127*   Lipid Profile: No results for input(s): CHOL, HDL, LDLCALC, TRIG, CHOLHDL, LDLDIRECT in the last 72 hours. Thyroid  Function Tests: Recent Labs    05/29/24 0703  TSH 1.650  FREET4 1.58   Anemia Panel: No results for input(s): VITAMINB12, FOLATE, FERRITIN, TIBC, IRON, RETICCTPCT in the last 72 hours. Urine analysis:    Component Value Date/Time   COLORURINE YELLOW (A) 04/13/2024 0025   APPEARANCEUR CLOUDY (A) 04/13/2024 0025   APPEARANCEUR Cloudy (A) 11/09/2023 0928   LABSPEC 1.010 04/13/2024 0025   PHURINE 5.0 04/13/2024 0025   GLUCOSEU >=500 (A) 04/13/2024 0025   HGBUR LARGE (A) 04/13/2024 0025   BILIRUBINUR NEGATIVE 04/13/2024 0025   BILIRUBINUR negative 01/04/2024 1545   BILIRUBINUR Negative 11/09/2023 0928    KETONESUR NEGATIVE 04/13/2024 0025   PROTEINUR NEGATIVE 04/13/2024 0025   UROBILINOGEN 0.2 01/04/2024 1545   NITRITE NEGATIVE 04/13/2024 0025   LEUKOCYTESUR MODERATE (A) 04/13/2024 0025   Sepsis Labs: @LABRCNTIP (procalcitonin:4,lacticidven:4)  ) Recent Results (from the past 240 hours)  Resp panel by RT-PCR (RSV, Flu A&B, Covid) Anterior Nasal Swab     Status: None   Collection Time: 05/28/24  7:35 PM   Specimen: Anterior Nasal Swab  Result Value Ref Range Status   SARS Coronavirus 2 by RT PCR NEGATIVE NEGATIVE Final    Comment: (NOTE) SARS-CoV-2 target nucleic acids are NOT DETECTED.  The SARS-CoV-2 RNA is generally detectable in upper respiratory specimens during the acute phase of infection. The lowest concentration of SARS-CoV-2 viral copies this assay can detect is 138 copies/mL. A negative result does not preclude SARS-Cov-2 infection and should not be used as the sole basis for treatment or other patient  management decisions. A negative result may occur with  improper specimen collection/handling, submission of specimen other than nasopharyngeal swab, presence of viral mutation(s) within the areas targeted by this assay, and inadequate number of viral copies(<138 copies/mL). A negative result must be combined with clinical observations, patient history, and epidemiological information. The expected result is Negative.  Fact Sheet for Patients:  bloggercourse.com  Fact Sheet for Healthcare Providers:  seriousbroker.it  This test is no t yet approved or cleared by the [place]  FDA and  has been authorized for detection and/or diagnosis of SARS-CoV-2 by FDA under an Emergency Use Authorization (EUA). This EUA will remain  in effect (meaning this test can be used) for the duration of the COVID-19 declaration under Section 564(b)(1) of the Act, 21 U.S.C.section 360bbb-3(b)(1), unless the authorization is terminated   or revoked sooner.       Influenza A by PCR NEGATIVE NEGATIVE Final   Influenza B by PCR NEGATIVE NEGATIVE Final    Comment: (NOTE) The Xpert Xpress SARS-CoV-2/FLU/RSV plus assay is intended as an aid in the diagnosis of influenza from Nasopharyngeal swab specimens and should not be used as a sole basis for treatment. Nasal washings and aspirates are unacceptable for Xpert Xpress SARS-CoV-2/FLU/RSV testing.  Fact Sheet for Patients: bloggercourse.com  Fact Sheet for Healthcare Providers: seriousbroker.it  This test is not yet approved or cleared by the [place]  FDA and has been authorized for detection and/or diagnosis of SARS-CoV-2 by FDA under an Emergency Use Authorization (EUA). This EUA will remain in effect (meaning this test can be used) for the duration of the COVID-19 declaration under Section 564(b)(1) of the Act, 21 U.S.C. section 360bbb-3(b)(1), unless the authorization is terminated or revoked.     Resp Syncytial Virus by PCR NEGATIVE NEGATIVE Final    Comment: (NOTE) Fact Sheet for Patients: bloggercourse.com  Fact Sheet for Healthcare Providers: seriousbroker.it  This test is not yet approved or cleared by the [place]  FDA and has been authorized for detection and/or diagnosis of SARS-CoV-2 by FDA under an Emergency Use Authorization (EUA). This EUA will remain in effect (meaning this test can be used) for the duration of the COVID-19 declaration under Section 564(b)(1) of the Act, 21 U.S.C. section 360bbb-3(b)(1), unless the authorization is terminated or revoked.  Performed at [facility], [address] [place], [place] [address]          Radiology Studies: CT Angio Chest PE W/Cm &/Or Wo Cm Result Date: 05/28/2024 EXAM: CTA CHEST 05/28/2024 09:29:47 PM TECHNIQUE: CTA of the chest was performed after the administration of  intravenous contrast. Multiplanar reformatted images are provided for review. MIP images are provided for review. Automated exposure control, iterative reconstruction, and/or weight based adjustment of the mA/kV was utilized to reduce the radiation dose to as low as reasonably achievable. COMPARISON: AP and lateral chest for today, CTA chest 05/04/2024, CTA chest 06/14/2022. CLINICAL HISTORY: Pulmonary embolism (PE) suspected, high prob. FINDINGS: PULMONARY ARTERIES: Pulmonary arteries are adequately opacified for evaluation. Enlarged pulmonary trunk measures 3.4 cm, consistent with arterial hypertension. No arterial embolism is seen to the segmental level, the subsegmental arterial bed is poorly evaluated due to respiratory motion. MEDIASTINUM: There is metal artifact from a single lead left sided cardiac assist device, the tip of the wire is in the right ventricle. There is moderate cardiomegaly with biatrial chamber predominance. No pericardial effusion. Scattered three-vessel coronary calcifications noted. There is aortic tortuosity, patchy atherosclerosis, and ascending aortic dilatation to 4.7 cm as before.  Scattered calcifications in the great vessels. There is no dissection or stenosis. LYMPH NODES: There are mildly prominent mediastinal and bilateral hilar lymph nodes with some showing calcifications. Examples include right paratracheal chain nodes up to 1.2 cm, pretracheal lymph nodes up to 1 cm with precarinal and subcarinal lymph nodes to 1.3 cm. Largest right hilar lymph node noted is 1.3 cm. Axillary spaces are clear. LUNGS AND PLEURA: There are mild centrilobular and paraseptal emphysema changes in the upper lobes. Diffuse bronchial thickening. There are posterior atelectatic changes in the lungs, but no consolidation, suspicious nodule or effusion. The trachea and central airways are clear. No pneumothorax. UPPER ABDOMEN: In the abdomen, there is a nodular liver contour compatible with cirrhosis but  no splenomegaly or ascites. There is fatty infiltration in the pancreas. No acute upper abdominal findings. SOFT TISSUES AND BONES: Degenerative changes in the thoracic spine. No acute or significant osseous findings. Bilateral acromiohumeral abutment is chronically seen consistent with chronic rotator cuff arthropathy with likely degenerative tears. IMPRESSION: 1. No evidence of pulmonary embolism to the segmental level; subsegmental arteries are poorly evaluated due to respiratory motion. 2. Enlarged pulmonary trunk measuring 3.4 cm, consistent with pulmonary arterial hypertension. Previously 3. Moderate cardiomegaly with biatrial chamber predominance, without pericardial effusion. 3-vessel coronary artery disease (cad). 4. Ascending aortic dilatation to 4.7 cm, without dissection or stenosis. Semiannual CTA or MRA follow-up and referral to cardiothoracic surgery recommended if not already done . 5. Mild centrilobular and paraseptal emphysema in the upper lobes; pulmonary emphysema is an independent risk factor for lung cancer, and consideration is recommended for evaluation for a low-dose CT lung cancer screening program. 6. No consolidation, suspicious nodule, or pleural effusion. Electronically signed by: Francis Quam MD 05/28/2024 10:04 PM EST RP Workstation: [id number]   DG Chest 2 View Result Date: 05/28/2024 CLINICAL DATA:  Shortness of breath EXAM: CHEST - 2 VIEW COMPARISON:  05/01/2024 FINDINGS: Left-sided single lead pacing device as before. Cardiomegaly without focal airspace disease, pleural effusion or pneumothorax. Minimal atelectasis or scarring at the lingula. IMPRESSION: No active cardiopulmonary disease. Cardiomegaly. Electronically Signed   By: Luke Bun M.D.   On: 05/28/2024 18:06        Scheduled Meds:  apixaban   5 mg Oral BID   atorvastatin   40 mg Oral Daily   digoxin   62.5 mcg Oral Daily   [START ON 05/30/2024] furosemide   40 mg Oral Daily   insulin  aspart  0-5 Units  Subcutaneous QHS   insulin  aspart  0-9 Units Subcutaneous TID WC   ipratropium-albuterol   3 mL Nebulization Q6H   methimazole   20 mg Oral BID   metoprolol  succinate  50 mg Oral BID   nicotine   21 mg Transdermal Daily   pantoprazole   40 mg Oral BID   sacubitril -valsartan   1 tablet Oral BID   spironolactone   25 mg Oral Daily   Continuous Infusions:   LOS: 0 days     Devaughn KATHEE Ban, MD [facility]   If 7PM-7AM, please contact night-coverage [email or web address] Password [email or web address] 05/29/2024, 3:54 PM     "

## 2024-05-29 NOTE — Progress Notes (Signed)
 Heart Failure Stewardship Pharmacy Note  PCP: Vicci Duwaine SQUIBB, DO PCP-Cardiologist: Deatrice Cage, MD  HPI: Eugene Henderson is a 73 y.o. male with CHF, NICM, PH, AF, AAA, T2DM, hyperthyroidism, and COPD who presented with shortness of breath and lingering pain after a fall attempting to use the commode. On admission, proBNP was 1026, HS-troponin was 21, TSH was 1.62, free T4 was 1.58, and digoxin  level was <0.6. Chest x-ray noted no acute changes. CTA negative for PE, pulmonary trunk of 3.4 cm, and ascending aortic dialtion to 4.7 cm.   Pertinent cardiac history: TTE 07/2016 with LVEF of 25-30%. RHC at the same time noted CO of 4.16, CI of 1.77, RA 12 mmHg, PA 37/12 mmHg, mPAP 12 mmHg, and PCWP of 12 mmHG. LHC negative for significant ischemic disease. Admitted 06/2022 for shortness of breath and chest pain, found to be in AF RVR. 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 TTE at that time with LVEF previouly uimproved to 30-35% back down to 20-25%. Cardiac MRI 07/2022 showed LVEF 17%, RVEF 36%, small area of lateral wall subendocardial LGE, mid-wall LGE at this inferoseptal RV insertion site. Underwent and ultimately failed DCCV 09/2022. ICD placed 10/2022. LHC 12/2023 noted no obstructive CAD, CO was 6.24 and CI was 2.51, PVR was 6 WU. TTE 04/2024 with LVEF of 40-45%, mild LVH, moderate MR, moderate TR, moderate PR.  Pertinent Lab Values: Creatinine, Ser  Date Value Ref Range Status  05/29/2024 0.76 0.61 - 1.24 mg/dL Final   BUN  Date Value Ref Range Status  05/29/2024 8 8 - 23 mg/dL Final  90/95/7974 16 8 - 27 mg/dL Final   Potassium  Date Value Ref Range Status  05/29/2024 3.2 (L) 3.5 - 5.1 mmol/L Final   Sodium  Date Value Ref Range Status  05/29/2024 133 (L) 135 - 145 mmol/L Final  02/09/2024 134 134 - 144 mmol/L Final   B Natriuretic Peptide  Date Value Ref Range Status  04/22/2023 228.5 (H) 0.0 - 100.0 pg/mL Final    Comment:    Performed at  [facility], [address] [place], [place] [address]   BNP  Date Value Ref Range Status  03/06/2024 37.6 0.0 - 100.0 pg/mL Final    Comment:    [employer] ADVIA Centaur XP methodology   Magnesium   Date Value Ref Range Status  05/28/2024 1.7 1.7 - 2.4 mg/dL Final    Comment:    Performed at [facility], [address] [place], [place] [address]   HB A1C ([facility])  Date Value Ref Range Status  02/09/2024 6.9 (H) 4.8 - 5.6 % Final    Comment:             Prediabetes: 5.7 - 6.4          Diabetes: >6.4          Glycemic control for adults with diabetes: <7.0    Hgb A1c MFr Bld  Date Value Ref Range Status  05/02/2024 6.2 (H) 4.8 - 5.6 % Final    Comment:    (NOTE)         Prediabetes: 5.7 - 6.4         Diabetes: >6.4         Glycemic control for adults with diabetes: <7.0    Digoxin  Level  Date Value Ref Range Status  05/28/2024 <0.6 (L) 0.8 - 2.0 ng/mL Final    Comment:  Performed at [facility], [address] [place], [place] [address]   TSH  Date Value Ref Range Status  05/29/2024 1.650 0.350 - 4.500 uIU/mL Final    Comment:    Performed at [facility], [address] [place], [place] [address]  02/09/2024 3.810 0.450 - 4.500 uIU/mL Final    Vital Signs: Temp:  [97.9 F (36.6 C)-98.4 F (36.9 C)] 98.4 F (36.9 C) (12/23 0607) Pulse Rate:  [94-137] 100 (12/23 0607) Cardiac Rhythm: Atrial fibrillation (12/23 0750) Resp:  [12-21] 20 (12/23 0607) BP: (97-147)/(64-94) 120/92 (12/23 0607) SpO2:  [97 %-100 %] 100 % (12/23 0607) Weight:  [117.9 kg (260 lb)] 117.9 kg (260 lb) (12/22 1710)  Intake/Output Summary (Last 24 hours) at 05/29/2024 0820 Last data filed at 05/29/2024 0708 Gross per 24 hour  Intake --  Output 525 ml  Net -525 ml    Current Heart Failure Medications:  Loop diuretic: furosemide  40 mg PO daily Beta-Blocker: metoprolol  succinate 50 mg dialy ACEI/ARB/ARNI: Entresto  49-51 mg  BID MRA: spironolactone  25 mg daily SGLT2i: none Other: digoxin  0.0625 mg daily  Prior to admission Heart Failure Medications:  Loop diuretic: furosemide  40 mg daily Beta-Blocker: metoprolol  succinate 50 mg daily ACEI/ARB/ARNI: Entresto  49-51 mg BID (also noted on med list are losartan  and irbesartan ) MRA: spironolactone  25 mg daily SGLT2i: Jardiance  10 mg daily Other: digoxin  0.0625 mg daily  Assessment: 1. Acute on chronic systolic heart failure (LVEF 40-45%)  , due to NICM. NYHA class III symptoms.  -Symptoms: Reports shortness of breath has worsened over the past month. Denies LEE. Appetite is modest. Reports pain from his recent fall. Reports significant orthopnea.  -Volume: Volume difficult to assess due to body habitus and wide array of potential causes for symptoms. BNP is elevated, but less than previous. Currently on furosemide  40 mg PO daily. -Hemodynamics: BP is variable. HR elevated to 110-120s in AF. -BB: Metoprolol  succinate increased to 50 mg PO BID.  -ACEI/ARB/ARNI: Continue Entresto  49-51 mg BID. May be able to increase pending BP. -MRA: Continue spironolactone  25 mg daily -SGLT2i: Consider adding home Jardiance  10 mg daily.  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    Please do not hesitate to reach out with questions or concerns,  Jaun Bash, PharmD, CPP, BCPS, [facility] Heart Failure Pharmacist  Phone - [phone] 05/29/2024 12:59 PM

## 2024-05-29 NOTE — Consult Note (Addendum)
 "  Cardiology Consultation:   Patient ID: Eugene Henderson; [id number]; [birth year]   Admit date: 05/28/2024 Date of Consult: 05/29/2024  Primary Care Provider: Louder Duwaine SQUIBB, DO Primary Cardiologist: Eugene Henderson Primary Electrophysiologist:  Eugene Henderson Advanced Heart Failure: Eugene Henderson   Patient Profile:   Eugene Henderson is a 73 y.o. 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 Kandis.  History of Present Illness:   [name] cardiac history dates back to at least 2018 at which time he was admitted for acute HFrEF in the setting of A-fib with RVR and hyperthyroidism not on pharmacotherapy due to financial reasons.  Echo at that time showed an EF of 25 to 30% with moderate mitral regurgitation and moderate pulmonary hypertension.  LHC showed no significant CAD.  Cardiomyopathy persisted despite GDMT.  Cardiac MRI in 2024 showed an EF of 17%, small lateral wall subendocardial LGE indicating possible prior infarct, mid wall LGE in the LV basal mid septal wall at RV insertion point suggestive of nonischemic cardiomyopathy, no diffuse myocardial scar or infiltrative disease noted, with moderately dilated ascending aorta measuring 47 mm.  He subsequently underwent ICD implant in 10/2022.  More recently, he underwent myocardial PET/CT in 11/2023 with findings consistent with a small area of inferior apical infarction with peri-infarct ischemia that was overall intermediate risk.  R/LHC in 12/2023 showed minor irregularities with no evidence of obstructive CAD.  RHC showed moderately elevated right atrial pressure, moderate pulmonary pretension, and normal cardiac output.  He was recently admitted to the hospital in late 04/2024 with COPD exacerbation with admission  complicated by underlying cardiomyopathy and permanent A-fib with RVR felt to be secondary to noncompliance with pharmacotherapy.  Echo in 04/2024 showed an EF of 40 to 45%, mildly dilated LV internal cavity size, mild LVH, mildly reduced RV systolic function with mildly large ventricular cavity size, severely elevated RVSP estimated at 64.3 mmHg, severe biatrial enlargement, degenerative mitral valve with moderate regurgitation, moderate tricuspid regurgitation, tricuspid aortic valve with mild insufficiency, moderate pulmonic regurgitation, moderate dilatation of the aortic root measuring 48 mm and a ascending thoracic aorta measuring 45 mm, and elevated CVP estimated at 15 mmHg.  He returned to [facility] on 05/28/2024 in the setting of severe worsening of left elbow and wrist pain leading to shortness of breath which he has attributed to progressive severe worsening pain involving the left wrist and left elbow following a mechanical fall approximately 1 month ago with imaging earlier this month concerning for possible occult fracture as well as possible chronic rotator cuff tear.  Noncompliant with supplemental oxygen at home.  In the setting of the above, he also had some brief chest discomfort.  No palpitations, dizziness, presyncope, or syncope.  He reports his weight has trended down approximately 5 pounds since his last hospital admission.  No significant lower extremity swelling, abdominal ascension, or early satiety.  Notes chronic stable orthopnea.  He reports he is taking medications, though is unclear exactly what he is taking.  He is tearful when discussing left elbow and wrist pain.  Labs: High-sensitivity troponin 21 with a delta troponin 18, proBNP 1026 improved from 5875 four weeks prior, digoxin  less than 0.6, potassium 4.3 trending to 3.2, renal function normal, Hgb 12.9, COVID, influenza, and RSV negative.  Chest x-ray without active  cardiopulmonary disease with cardiomegaly noted.  CTA of the  chest negative for PE with enlarged pulmonary trunk consistent with pulmonary hypertension, moderate cardiomegaly with biatrial chamber prominence without pericardial effusion, coronary artery calcification, ascending thoracic aortic dilatation of 4.7 cm, and emphysema.  In the setting of his permanent A-fib, ventricular rates have been ranged from the 90s to 130s bpm.  His main complaint at this time is progressive and severe left elbow and wrist pain with sling and wrist splint in place.    Past Medical History:  Diagnosis Date   Arthritis    Ascending aortic aneurysm    a. 09/2017 Stable TAA - 5.1cm.   Asthma    Chronic systolic CHF (congestive heart failure) (HCC)    a. EF 25-30% by echo in 07/2016 with cath showing no significant CAD b. 01/2017: EF 30-35% with diffuse HK and moderate MR; c. 07/2017 Echo: EF 30-35%, diff hK. Mild MR, mildly dil LA. PASP .   Diverticulitis    Diverticulitis of large intestine with perforation without abscess or bleeding 05/13/2017   Hypertension    Hyperthyroidism    NICM (nonischemic cardiomyopathy) (HCC)    Noncompliance    Persistent atrial fibrillation (HCC)    a. CHA2DS2VASc = 3-->Eliquis  (? compliance).    Past Surgical History:  Procedure Laterality Date   CARDIOVERSION N/A 06/23/2022   Procedure: CARDIOVERSION;  Surgeon: Eugene Led, DO;  Location: [facility] ORS;  Service: Cardiovascular;  Laterality: N/A;   CARDIOVERSION N/A 06/25/2022   Procedure: CARDIOVERSION;  Surgeon: Eugene Led, DO;  Location: [facility] ORS;  Service: Cardiovascular;  Laterality: N/A;   CARDIOVERSION N/A 09/29/2022   Procedure: CARDIOVERSION;  Surgeon: Eugene Henderson Ezra RAMAN, MD;  Location: [facility] INVASIVE CV LAB;  Service: Cardiovascular;  Laterality: N/A;   COLONOSCOPY N/A 08/27/2019   Procedure: COLONOSCOPY;  Surgeon: Toledo, Ladell POUR, MD;  Location: [facility] ENDOSCOPY;  Service: Gastroenterology;  Laterality: N/A;   ENDOVASCULAR STENT GRAFT (AAA) N/A 06/17/2022    Procedure: ENDOVASCULAR REPAIR/STENT GRAFT;  Surgeon: Eugene Selinda RAMAN, MD;  Location: [facility] INVASIVE CV LAB;  Service: Cardiovascular;  Laterality: N/A;   ICD IMPLANT N/A 11/03/2022   Procedure: ICD IMPLANT;  Surgeon: Eugene Henderson Ole DASEN, MD;  Location: [facility] INVASIVE CV LAB;  Service: Cardiovascular;  Laterality: N/A;   JOINT REPLACEMENT Right    RIGHT HEART CATH Right 09/21/2022   Procedure: RIGHT HEART CATH;  Surgeon: Eugene Henderson Ezra RAMAN, MD;  Location: [facility] INVASIVE CV LAB;  Service: Cardiovascular;  Laterality: Right;   RIGHT/LEFT HEART CATH AND CORONARY ANGIOGRAPHY N/A 08/02/2016   Procedure: Right/Left Heart Cath and Coronary Angiography;  Surgeon: Deatrice DELENA Cage, MD;  Location: [facility] INVASIVE CV LAB;  Service: Cardiovascular;  Laterality: N/A;   RIGHT/LEFT HEART CATH AND CORONARY ANGIOGRAPHY Bilateral 12/16/2023   Procedure: RIGHT/LEFT HEART CATH AND CORONARY ANGIOGRAPHY;  Surgeon: Cage Deatrice DELENA, MD;  Location: [facility] INVASIVE CV LAB;  Service: Cardiovascular;  Laterality: Bilateral;   TEE WITHOUT CARDIOVERSION N/A 06/23/2022   Procedure: TRANSESOPHAGEAL ECHOCARDIOGRAM;  Surgeon: Eugene Led, DO;  Location: [facility] ORS;  Service: Cardiovascular;  Laterality: N/A;   TESTICLE SURGERY     Patient states that he had to have the tube fixed.     Home Meds: Prior to Admission medications  Medication Sig Start Date End Date Taking? Authorizing Provider  acetaminophen  (TYLENOL ) 500 MG tablet Take 1-2 tablets (500-1,000 mg total) by mouth every 6 (six) hours as needed for mild pain (pain score 1-3), fever or headache. Patient taking differently: Take 1,000 mg by  mouth 2 (two) times daily. 04/12/23  Yes Bissonnette, Megan P, DO  albuterol  (VENTOLIN  HFA) 108 (90 Base) MCG/ACT inhaler Inhale 1-2 puffs into the lungs every 6 (six) hours as needed for wheezing or shortness of breath. 11/09/23  Yes Kilbride, Megan P, DO  amiodarone  (PACERONE ) 200 MG tablet Take 200 mg by mouth daily. 11/05/23  Yes [provider]  amLODipine  (NORVASC ) 5 MG tablet Take 5 mg by mouth daily.   Yes [provider]  atorvastatin  (LIPITOR) 40 MG tablet TAKE 1 TABLET EVERY DAY 05/01/24  Yes Hammill, Megan P, DO  cyclobenzaprine  (FLEXERIL ) 5 MG tablet Take 1 tablet (5 mg total) by mouth 3 (three) times daily as needed for muscle spasms. 05/10/24  Yes Pokhrel, Laxman, MD  digoxin  (LANOXIN ) 0.125 MG tablet TAKE 1/2 TABLET BY MOUTH EVERY DAY 11/07/23  Yes Eugene Henderson Ole DASEN, MD  ELIQUIS  5 MG TABS tablet TAKE 1 TABLET TWICE DAILY 04/30/24  Yes Eugene Henderson Ole DASEN, MD  furosemide  (LASIX ) 40 MG tablet Take 1 tablet (40 mg total) by mouth daily. 11/03/23  Yes Eugene Henderson Ole DASEN, MD  irbesartan  (AVAPRO ) 150 MG tablet Take 150 mg by mouth daily.   Yes [provider]  losartan  (COZAAR ) 25 MG tablet Take 25 mg by mouth daily.   Yes [provider]  metFORMIN  (GLUCOPHAGE ) 500 MG tablet TAKE 1 TABLET TWICE DAILY WITH MEALS 05/01/24  Yes Baer, Megan P, DO  methimazole  (TAPAZOLE ) 10 MG tablet Take 20 mg by mouth 2 (two) times daily.   Yes [provider]  metoprolol  succinate (TOPROL -XL) 50 MG 24 hr tablet Take 1 tablet (50 mg total) by mouth daily. Reduced from 150 mg. 04/14/24  Yes Awanda City, MD  nitroGLYCERIN  (NITROSTAT ) 0.4 MG SL tablet Place 0.4 mg under the tongue every 5 (five) minutes as needed. 03/20/24  Yes [provider]  omeprazole (PRILOSEC) 20 MG capsule Take 20 mg by mouth 2 (two) times daily. 05/25/24  Yes [provider]  oxyCODONE  10 MG TABS Take 1 tablet (10 mg total) by mouth every 4 (four) hours as needed for moderate pain (pain score 4-6), severe pain (pain score 7-10) or breakthrough pain. 05/10/24  Yes Pokhrel, Laxman, MD  pantoprazole  (PROTONIX ) 40 MG tablet Take 1 tablet (40 mg total) by mouth 2 (two) times daily. 11/09/23  Yes Alfieri, Megan P, DO  polyethylene glycol (MIRALAX  / GLYCOLAX ) 17 g packet Take 17 g by mouth daily as needed for mild constipation or moderate  constipation (not helped with stool softner). 05/10/24  Yes Pokhrel, Laxman, MD  Potassium Chloride  ER 20 MEQ TBCR Take 1 tablet by mouth daily. 05/25/24  Yes [provider]  potassium chloride  SA (KLOR-CON  M) 20 MEQ tablet TAKE 1 TABLET BY MOUTH EVERY DAY 03/09/24  Yes Britain, Megan P, DO  predniSONE  (DELTASONE ) 50 MG tablet Take 50 mg by mouth daily with breakfast.   Yes [provider]  sacubitril -valsartan  (ENTRESTO ) 49-51 MG Take 1 tablet by mouth 2 (two) times daily. 12/08/23  Yes Furth, Cadence H, PA-C  Semaglutide  (RYBELSUS ) 7 MG TABS Take 1 tablet (7 mg total) by mouth daily. 03/08/24  Yes Bonilla, Megan P, DO  senna-docusate (SENOKOT-S) 8.6-50 MG tablet Take 1 tablet by mouth at bedtime. 05/10/24  Yes Pokhrel, Laxman, MD  spironolactone  (ALDACTONE ) 25 MG tablet Take 1 tablet (25 mg total) by mouth daily. 11/09/23  Yes Privott, Megan P, DO  Tiotropium Bromide-Olodaterol (STIOLTO RESPIMAT ) 2.5-2.5 MCG/ACT AERS INHALE 2 PUFFS BY MOUTH INTO  THE LUNGS DAILY 10/19/23  Yes Isadora Hose, MD  alum & mag hydroxide-simeth (MAALOX/MYLANTA) 200-200-20 MG/5ML suspension Take 30 mLs by mouth every 4 (four) hours as needed for indigestion. 05/10/24   Pokhrel, Laxman, MD  Blood Glucose Monitoring Suppl DEVI 1 each by Does not apply route as directed. Dispense based on patient and insurance preference. Use up to four times daily as directed. (FOR ICD-10 E10.9, E11.9). 04/12/24   Melvin Pao, NP  Blood Pressure Monitoring (BLOOD PRESSURE CUFF) MISC Check blood pressure as instructed by your physician 12/08/23   Franchester, Cadence H, PA-C  diclofenac  Sodium (VOLTAREN ) 1 % GEL Apply 4 g topically 4 (four) times daily as needed (Apply to L wrist, L shoulder as desired for pain). Patient not taking: Reported on 05/28/2024 05/10/24   Sonjia Held, MD  empagliflozin  (JARDIANCE ) 10 MG TABS tablet TAKE 1 TABLET EVERY DAY 04/06/24   Eisenmenger, Megan P, DO  folic acid  (FOLVITE ) 1 MG tablet Take 1 tablet (1 mg  total) by mouth daily. Patient not taking: Reported on 05/28/2024 11/09/23   Vicci Bouchard P, DO  Glucose Blood (BLOOD GLUCOSE TEST STRIPS) STRP 1 each by Does not apply route as directed. Dispense based on patient and insurance preference. Use up to four times daily as directed. (FOR ICD-10 E10.9, E11.9). 04/12/24   Melvin Pao, NP  Lancet Device MISC 1 each by Does not apply route as directed. Dispense based on patient and insurance preference. Use up to four times daily as directed. (FOR ICD-10 E10.9, E11.9). 04/12/24   Melvin Pao, NP  Lancets MISC 1 each by Does not apply route as directed. Dispense based on patient and insurance preference. Use up to four times daily as directed. (FOR ICD-10 E10.9, E11.9). 04/12/24   Melvin Pao, NP    Inpatient Medications: Scheduled Meds:  amiodarone   200 mg Oral Daily   amLODipine   5 mg Oral Daily   apixaban   5 mg Oral BID   atorvastatin   40 mg Oral Daily   digoxin   62.5 mcg Oral Daily   furosemide   40 mg Intravenous Q12H   insulin  aspart  0-5 Units Subcutaneous QHS   insulin  aspart  0-9 Units Subcutaneous TID WC   ipratropium-albuterol   3 mL Nebulization Q6H   methimazole   20 mg Oral BID   metoprolol  succinate  50 mg Oral Daily   nicotine   21 mg Transdermal Daily   pantoprazole   40 mg Oral BID   potassium chloride   60 mEq Oral Once   sacubitril -valsartan   1 tablet Oral BID   Semaglutide   7 mg Oral Daily   spironolactone   25 mg Oral Daily   Continuous Infusions:  magnesium  sulfate bolus IVPB     PRN Meds: acetaminophen , albuterol , dextromethorphan-guaiFENesin , diphenhydrAMINE , hydrALAZINE , metoprolol  tartrate, nitroGLYCERIN , oxyCODONE   Allergies:  Allergies[1]  Social History:   Social History   Socioeconomic History   Marital status: Single    Spouse name: Not on file   Number of children: Not on file   Years of education: Not on file   Highest education level: Not on file  Occupational History   Occupation:  retired  Tobacco Use   Smoking status: Some Days    Current packs/day: 0.00    Types: Pipe, Cigarettes    Last attempt to quit: 12/27/2022    Years since quitting: 1.4   Smokeless tobacco: Never  Vaping Use   Vaping status: Never Used  Substance and Sexual Activity   Alcohol use: Yes    Alcohol/week:  2.0 standard drinks of alcohol    Types: 2 Cans of beer per week    Comment: 2 40oz beers/week   Drug use: No   Sexual activity: Not Currently  Other Topics Concern   Not on file  Social History Narrative   Not on file   Social Drivers of Health   Tobacco Use: High Risk (05/24/2024)   Received from [facility]   Patient History    Smoking Tobacco Use: Every Day    Smokeless Tobacco Use: Never    Passive Exposure: Not on file  Financial Resource Strain: Low Risk (03/27/2024)   Overall Financial Resource Strain (CARDIA)    Difficulty of Paying Living Expenses: Not very hard  Food Insecurity: No Food Insecurity (05/02/2024)   Epic    Worried About Radiation Protection Practitioner of Food in the Last Year: Never true    Ran Out of Food in the Last Year: Never true  Transportation Needs: No Transportation Needs (05/02/2024)   Epic    Lack of Transportation (Medical): No    Lack of Transportation (Non-Medical): No  Recent Concern: Transportation Needs - Unmet Transportation Needs (03/27/2024)   Epic    Lack of Transportation (Medical): Yes    Lack of Transportation (Non-Medical): Yes  Physical Activity: Inactive (07/27/2022)   Exercise Vital Sign    Days of Exercise per Week: 0 days    Minutes of Exercise per Session: 0 min  Stress: No Stress Concern Present (07/27/2022)   [employer] of Occupational Health - Occupational Stress Questionnaire    Feeling of Stress : Not at all  Social Connections: Socially Isolated (05/02/2024)   Social Connection and Isolation Panel    Frequency of Communication with Friends and Family: More than three times a week    Frequency of Social  Gatherings with Friends and Family: More than three times a week    Attends Religious Services: Never    Database Administrator or Organizations: No    Attends Banker Meetings: Never    Marital Status: Divorced  Catering Manager Violence: Not At Risk (05/02/2024)   Epic    Fear of Current or Ex-Partner: No    Emotionally Abused: No    Physically Abused: No    Sexually Abused: No  Depression (PHQ2-9): Medium Risk (04/16/2024)   Depression (PHQ2-9)    PHQ-2 Score: 6  Alcohol Screen: Low Risk (07/27/2022)   Alcohol Screen    Last Alcohol Screening Score (AUDIT): 2  Housing: Unknown (05/24/2024)   Received from [facility]   Epic    Unable to Pay for Housing in the Last Year: Not on file    Number of Times Moved in the Last Year: Not on file    At any time in the past 12 months, were you homeless or living in a shelter (including now)?: No  Utilities: Not At Risk (05/02/2024)   Epic    Threatened with loss of utilities: No  Health Literacy: Not on file     Family History:   Family History  Problem Relation Age of Onset   Diabetes Mother    Diabetes Father    Heart disease Brother    Heart attack Brother    Diabetes Brother    Kidney failure Brother    Thyroid  disease Neg Hx     ROS:  Review of Systems  Constitutional:  Positive for malaise/fatigue. Negative for chills, diaphoresis, fever and weight loss.  HENT:  Negative for  congestion.   Eyes:  Negative for discharge and redness.  Respiratory:  Positive for shortness of breath. Negative for cough, sputum production and wheezing.   Cardiovascular:  Positive for orthopnea. Negative for chest pain, palpitations, claudication, leg swelling and PND.  Gastrointestinal:  Negative for abdominal pain, blood in stool, heartburn, melena, nausea and vomiting.  Musculoskeletal:  Positive for falls and joint pain. Negative for myalgias.  Skin:  Negative for rash.  Neurological:  Negative for dizziness,  tingling, tremors, sensory change, speech change, focal weakness, loss of consciousness and weakness.  Endo/Heme/Allergies:  Does not bruise/bleed easily.  Psychiatric/Behavioral:  Negative for substance abuse. The patient is not nervous/anxious.   All other systems reviewed and are negative.     Physical Exam/Data:   Vitals:   05/29/24 0500 05/29/24 0515 05/29/24 0530 05/29/24 0607  BP: 97/77  100/78 (!) 120/92  Pulse:  94  100  Resp:  20  20  Temp:    98.4 F (36.9 C)  TempSrc:    Oral  SpO2:  100%  100%  Weight:      Height:        Intake/Output Summary (Last 24 hours) at 05/29/2024 0937 Last data filed at 05/29/2024 0708 Gross per 24 hour  Intake --  Output 525 ml  Net -525 ml   Filed Weights   05/28/24 1710  Weight: 117.9 kg   Body mass index is 35.26 kg/m.   Physical Exam: General: Well developed, well nourished, in no acute distress. Head: Normocephalic, atraumatic, sclera non-icteric, no xanthomas, nares without discharge.  Neck: Negative for carotid bruits. JVD difficult to assess secondary to body habitus. Lungs: Diminished breath sounds bilaterally. Breathing is unlabored. Heart: IRIR with S1 S2. II/VI systolic murmur LUSB, no rubs, or gallops appreciated. Abdomen: Soft, non-tender, non-distended with normoactive bowel sounds. No hepatomegaly. No rebound/guarding. No obvious abdominal masses. Msk:  Strength and tone appear normal for age.  Left wrist in splint.  Left elbow in sling. Extremities: No clubbing or cyanosis. No edema.  Neuro: Alert and oriented X 3. No facial asymmetry. No focal deficit. Psych:  Responds to questions appropriately with a normal affect.   EKG:  The EKG was personally reviewed and demonstrates: A-fib with RVR, 137 bpm, baseline wandering and artifact, LAFB, poor R wave progression along the precordial leads, nonspecific ST-T changes Telemetry:  Telemetry was personally reviewed and demonstrates: A-fib with ventricular rates  largely in the 90s to low 100s bpm with brief paroxysms into the 120s bpm  Weights: Filed Weights   05/28/24 1710  Weight: 117.9 kg    Relevant CV Studies:  2D echo 04/26/2024: 1. Left ventricular ejection fraction, by estimation, is 40 to 45%. The  left ventricle has mildly decreased function. Left ventricular endocardial  border not optimally defined to evaluate regional wall motion. The left  ventricular internal cavity size  was mildly dilated. There is mild left ventricular hypertrophy. Left  ventricular diastolic parameters are indeterminate. The global  longitudinal strain is abnormal.   2. Right ventricular systolic function is mildly reduced. The right  ventricular size is mildly enlarged. There is severely elevated pulmonary  artery systolic pressure. The estimated right ventricular systolic  pressure is 64.3 mmHg.   3. Left atrial size was severely dilated.   4. Right atrial size was severely dilated.   5. The mitral valve is degenerative. Moderate mitral valve regurgitation.   6. Tricuspid valve regurgitation is moderate.   7. The aortic valve is tricuspid. Aortic valve  regurgitation is mild. No  aortic stenosis is present.   8. Pulmonic valve regurgitation is moderate.   9. Aortic dilatation noted. There is moderate dilatation of the aortic  root, measuring 48 mm. There is moderate dilatation of the ascending  aorta, measuring 45 mm.  10. The inferior vena cava is dilated in size with <50% respiratory  variability, suggesting right atrial pressure of 15 mmHg.   Comparison(s): A prior study was performed on 06/15/2022. LV function  appears improved.  __________  [facility] 12/16/2023: 1.  Minor irregularities with no evidence of obstructive coronary artery disease. 2.  Left ventricular angiography was not performed.  EF is known to be moderately to severely reduced. 3.  Right heart catheterization showed moderately elevated right atrial pressure, moderate pulmonary  hypertension and normal cardiac output.   RA: 14 mmHg RV: 49/11 mmHg PW: 15 mmHg PA: 51/23 with a mean of 35 mmHg Cardiac output is 6.24 with an index of 2.51.   Recommendations: The patient has nonischemic cardiomyopathy.  Recommend medical therapy. __________  See CV procedures in Epic for further cardiac imaging  Laboratory Data:  Chemistry Recent Labs  Lab 05/28/24 1727 05/29/24 0703  NA 130* 133*  K 4.2 3.2*  CL 92* 94*  CO2 19* 26  GLUCOSE 152* 123*  BUN 8 8  CREATININE 0.98 0.76  CALCIUM  9.3 8.9  GFRNONAA >60 >60  ANIONGAP 19* 12    No results for input(s): PROT, ALBUMIN, AST, ALT, ALKPHOS, BILITOT in the last 168 hours. Hematology Recent Labs  Lab 05/28/24 1727 05/29/24 0703  WBC 8.0 5.5  RBC 4.82 4.46  HGB 12.9* 12.2*  HCT 40.1 37.1*  MCV 83.2 83.2  MCH 26.8 27.4  MCHC 32.2 32.9  RDW 15.9* 15.9*  PLT 210 182   Cardiac EnzymesNo results for input(s): TROPONINI in the last 168 hours. No results for input(s): TROPIPOC in the last 168 hours.  BNP Recent Labs  Lab 05/28/24 1727  PROBNP 1,026.0*    DDimer No results for input(s): DDIMER in the last 168 hours.  Radiology/Studies:  CT Angio Chest PE W/Cm &/Or Wo Cm Result Date: 05/28/2024 IMPRESSION: 1. No evidence of pulmonary embolism to the segmental level; subsegmental arteries are poorly evaluated due to respiratory motion. 2. Enlarged pulmonary trunk measuring 3.4 cm, consistent with pulmonary arterial hypertension. Previously 3. Moderate cardiomegaly with biatrial chamber predominance, without pericardial effusion. 3-vessel coronary artery disease (cad). 4. Ascending aortic dilatation to 4.7 cm, without dissection or stenosis. Semiannual CTA or MRA follow-up and referral to cardiothoracic surgery recommended if not already done . 5. Mild centrilobular and paraseptal emphysema in the upper lobes; pulmonary emphysema is an independent risk factor for lung cancer, and consideration is  recommended for evaluation for a low-dose CT lung cancer screening program. 6. No consolidation, suspicious nodule, or pleural effusion. Electronically signed by: Francis Quam MD 05/28/2024 10:04 PM EST RP Workstation: [id number]   DG Chest 2 View Result Date: 05/28/2024 IMPRESSION: No active cardiopulmonary disease. Cardiomegaly. Electronically Signed   By: Luke Bun M.D.   On: 05/28/2024 18:06    Assessment and Plan:   1.  Permanent A-fib: - Patient has permanent A-fib with ventricular rates largely in the 90s to low 100s with brief paroxysms into the 120s to 130s bpm - I do not think he is a permanent Afib is the likely driving force of his dyspnea at this time, however he would benefit from added ventricular rate control - Discontinue oral amiodarone  as patient has  permanent A-fib - Discontinue amlodipine  to allow for added BP room for uptitration of GDMT and rate control pharmacotherapy - Elevated rates driven by elbow pain - Increase Toprol -XL to 50 mg twice daily - Remains on digoxin  0.625 mg daily, maintain potassium at goal 4.0 - CHA2DS2-VASc at least - PTA apixaban  5 mg twice daily  2.  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: - Suspect the patient's dyspnea is multifactorial including possibly progressive pulmonary hypertension, HFmrEF, noncompliance with supplemental oxygen following discharge last month, and emphysema with underlying obesity and possible untreated sleep apnea - Not currently requiring supplemental oxygen - Does not appear grossly volume up - ProBNP improved and close to baseline - Stop IV Lasix  - Restart PTA oral Lasix  40 mg daily on 12/24 - Discontinue amlodipine  to allow for escalation of GDMT - Increase Toprol -XL to 50 mg twice daily for added ventricular rate control - Continue Entresto  49/51 mg twice daily, spironolactone  25 mg - Escalate GDMT as able  3.  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  Left wrist and elbow pain: - Stemming from a mechanical fall approximately 4 weeks ago - Reports severe left wrist and elbow pain - This was the main reason for his presentation to the ED - Likely driving his Afib rates - Management per internal medicine  5.  HTN: - Pharmacotherapy as outlined above  6.  Hyperthyroidism: - TSH normal - PTA methimazole   7.  Hypokalemia/hypomagnesemia: - Replete potassium and magnesium  to goal to goal 4.0 and 2.0 respectively     For questions or updates, please contact [facility] Please consult [email or web address] for contact info under Cardiology/STEMI.   Signed, Bernardino Bring, PA-C [facility] Pager: [phone] 05/29/2024, 9:37 AM       [1] No Known Allergies  "

## 2024-05-29 NOTE — TOC Initial Note (Signed)
 Transition of Care [facility]) - Initial/Assessment Note    Patient Details  Name: Eugene Henderson MRN: [id number] Date of Birth: 04-04-1951  [facility] CM/SW Contact:    Lorin Hauck L Lucette Kratz, LCSW Phone Number: 05/29/2024, 2:18 PM  Clinical Narrative:                   [facility] consult received for SNF placement. CSW spoke with patient daughter, Angeline, regarding recommendations. Wendi advised that patient does not live alone, he resides with her in her home but she is currently out of town until Monday. She stated that during the last admission, her father fell trying to get in the shower and he fractured his elbow and shoulder. She stated that he has use of one arm at the moment.   Patient was discharged from [facility] yesterday and was referred to [facility] for HHPT and OT. Wendi advised that [facility] came out for their assessment and advised that patient was not ready to be discharged from skilled nursing. She advised that [facility] discharged patient with an empty oxygen tank.   Wendi also advised that patient had bed sores. She stated that her father was not properly cared for. Angeline was advised of her rights to file a complaint with the state and the area's Ombudsman. She advised that [facility] advised her that they will file a complaint and she will as well.   Wendi advised of no preferences for SNF placement. She advised that she would like searches in [facility] but was open to [facility] searches as well.   FL2 will be completed and bed search will be initiated.        Patient Goals and CMS Choice            Expected Discharge Plan and Services                                              Prior Living Arrangements/Services                       Activities of Daily Living      Permission Sought/Granted                  Emotional Assessment              Admission diagnosis:  Atrial fibrillation with RVR (HCC)  [I48.91] Acute on chronic systolic CHF (congestive heart failure) (HCC) [I50.23] Patient Active Problem List   Diagnosis Date Noted   Atrial fibrillation with RVR (HCC) 05/28/2024   Obesity (BMI 30-39.9) 05/28/2024   Chronic systolic CHF (congestive heart failure) (HCC) 05/28/2024   Acute on chronic congestive heart failure (HCC) 05/05/2024   A-fib (HCC) 05/02/2024   Atrial fibrillation with rapid ventricular response (HCC) 05/01/2024   Postural dizziness with presyncope 04/13/2024   Orthostatic hypotension 04/13/2024   Urinary tract infection 01/04/2024   Abnormal stress test 12/16/2023   NICM (nonischemic cardiomyopathy) (HCC) 06/16/2022   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   Metal foreign body in head 06/14/2022   Pain in joint of right knee 07/27/2021   Post-traumatic osteoarthritis 07/27/2021   Aortic atherosclerosis 08/27/2020   Acute systolic heart failure (HCC)    AF (paroxysmal atrial fibrillation) (HCC)    Hyperlipidemia    Morbid obesity (HCC) 12/11/2018   Chronic pain of right knee 12/11/2018  Advance directive discussed with patient 05/16/2018   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   Keratitis due to infection 07/06/2017   Corneal ulcer 06/24/2017   COPD (chronic obstructive pulmonary disease) (HCC) 02/03/2017   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) 12/15/2016   AAA (abdominal aortic aneurysm) 11/29/2016   Chronic low back pain 11/29/2016   Alcohol abuse 11/29/2016   Hyponatremia 11/22/2016   Chronic systolic heart failure (HCC) 08/19/2016   Tobacco use 08/19/2016   Persistent atrial fibrillation (HCC) 07/28/2016   Hyperthyroidism 07/28/2016   Noncompliance with medications 07/28/2016   Essential hypertension 07/28/2016   PCP:  Vicci Duwaine SQUIBB, DO Pharmacy:   [facility] - [place], [place] - [address] [place] [address] Phone: [phone] Fax:  [phone]  [id number] - [place], [place] - [address] [place] [address] Phone: [phone] Fax: [phone]  [facility] [address], [place] - [address] [place] [address] Phone: [phone] Fax: [phone]     Social Drivers of Health (SDOH) Social History: SDOH Screenings   Food Insecurity: No Food Insecurity (05/02/2024)  Housing: Unknown (05/24/2024)   Received from [facility]  Transportation Needs: No Transportation Needs (05/02/2024)  Recent Concern: Transportation Needs - Unmet Transportation Needs (03/27/2024)  Utilities: Not At Risk (05/02/2024)  Alcohol Screen: Low Risk (07/27/2022)  Depression (PHQ2-9): Medium Risk (04/16/2024)  Financial Resource Strain: Low Risk (03/27/2024)  Physical Activity: Inactive (07/27/2022)  Social Connections: Socially Isolated (05/02/2024)  Stress: No Stress Concern Present (07/27/2022)  Tobacco Use: High Risk (05/24/2024)   Received from [facility]   SDOH Interventions:     Readmission Risk Interventions    06/16/2022    3:33 PM  Readmission Risk Prevention Plan  Transportation Screening Complete  PCP or Specialist Appt within 3-5 Days --  HRI or Home Care Consult Complete  Social Work Consult for Recovery Care Planning/Counseling Complete  Palliative Care Screening Not Applicable  Medication Review Oceanographer) Complete

## 2024-05-29 NOTE — ED Notes (Signed)
 Patient given snack and ice water.

## 2024-05-30 DIAGNOSIS — I428 Other cardiomyopathies: Secondary | ICD-10-CM | POA: Diagnosis not present

## 2024-05-30 DIAGNOSIS — I272 Pulmonary hypertension, unspecified: Secondary | ICD-10-CM | POA: Diagnosis not present

## 2024-05-30 DIAGNOSIS — I4891 Unspecified atrial fibrillation: Secondary | ICD-10-CM | POA: Diagnosis not present

## 2024-05-30 DIAGNOSIS — I2510 Atherosclerotic heart disease of native coronary artery without angina pectoris: Secondary | ICD-10-CM

## 2024-05-30 DIAGNOSIS — I4821 Permanent atrial fibrillation: Secondary | ICD-10-CM

## 2024-05-30 LAB — BASIC METABOLIC PANEL WITH GFR
Anion gap: 14 (ref 5–15)
BUN: 12 mg/dL (ref 8–23)
CO2: 23 mmol/L (ref 22–32)
Calcium: 8.7 mg/dL — ABNORMAL LOW (ref 8.9–10.3)
Chloride: 92 mmol/L — ABNORMAL LOW (ref 98–111)
Creatinine, Ser: 0.95 mg/dL (ref 0.61–1.24)
GFR, Estimated: >60 mL/min
Glucose, Bld: 120 mg/dL — ABNORMAL HIGH (ref 70–99)
Potassium: 4 mmol/L (ref 3.5–5.1)
Sodium: 129 mmol/L — ABNORMAL LOW (ref 135–145)

## 2024-05-30 LAB — GLUCOSE, CAPILLARY
Glucose-Capillary: 134 mg/dL — ABNORMAL HIGH (ref 70–99)
Glucose-Capillary: 174 mg/dL — ABNORMAL HIGH (ref 70–99)
Glucose-Capillary: 239 mg/dL — ABNORMAL HIGH (ref 70–99)
Glucose-Capillary: 145 mg/dL — ABNORMAL HIGH (ref 70–99)

## 2024-05-30 MED ORDER — IPRATROPIUM-ALBUTEROL 0.5-2.5 (3) MG/3ML IN SOLN
3.0000 mL | Freq: Three times a day (TID) | RESPIRATORY_TRACT | Status: DC
  Administered 2024-05-30 – 2024-05-31 (×2): 3 mL via RESPIRATORY_TRACT
  Filled 2024-05-30 (×2): qty 3

## 2024-05-30 MED ORDER — METHIMAZOLE 10 MG PO TABS
20.0000 mg | ORAL_TABLET | Freq: Two times a day (BID) | ORAL | Status: DC
  Administered 2024-05-30 – 2024-06-01 (×4): 20 mg via ORAL
  Filled 2024-05-30 (×4): qty 2

## 2024-05-30 NOTE — TOC Progression Note (Signed)
 Transition of Care [facility]) - Progression Note    Patient Details  Name: Eugene Henderson MRN: [id number] Date of Birth: 03/17/1951  [facility] CM/SW Contact  Shasta DELENA Daring, RN Phone Number: 05/30/2024, 1:28 PM  Clinical Narrative:    [facility] for SNF stay. Accepted placement at [facility].  Approved PlanAuthID: [id number] Dates:12/24-12/29/2025 Next Review Date: 06/04/2024                     Expected Discharge Plan and Services                                               Social Drivers of Health (SDOH) Interventions SDOH Screenings   Food Insecurity: No Food Insecurity (05/29/2024)  Housing: Unknown (05/24/2024)   Received from [facility]  Transportation Needs: No Transportation Needs (05/29/2024)  Recent Concern: Transportation Needs - Unmet Transportation Needs (03/27/2024)  Utilities: Not At Risk (05/02/2024)  Alcohol Screen: Low Risk (07/27/2022)  Depression (PHQ2-9): Medium Risk (04/16/2024)  Financial Resource Strain: Low Risk (03/27/2024)  Physical Activity: Inactive (07/27/2022)  Social Connections: Socially Isolated (05/02/2024)  Stress: No Stress Concern Present (07/27/2022)  Tobacco Use: Medium Risk (05/29/2024)    Readmission Risk Interventions    06/16/2022    3:33 PM  Readmission Risk Prevention Plan  Transportation Screening Complete  PCP or Specialist Appt within 3-5 Days --  HRI or Home Care Consult Complete  Social Work Consult for Recovery Care Planning/Counseling Complete  Palliative Care Screening Not Applicable  Medication Review Oceanographer) Complete

## 2024-05-30 NOTE — Progress Notes (Signed)
 Physical Therapy Treatment Patient Details Name: Eugene Henderson MRN: [id number] DOB: 02/09/1951 Today's Date: 05/30/2024   History of Present Illness Pt is a 73 y/o M admitted on 05/28/24 after presenting with c/o SOB. Pt with recent hospitalization 2/2 a-fib with RVR, COPD & CHF exacerbation, along with nondisplaced fx of radial head of L shoulder. Pt is being treated for a-fib with RVR, CHF. PMH: a-fib on Eliquis , sCHF, HTN, HLD, DM, COPD, tobacco abuse, obesity, hyperthyroidism, medication noncompliance    PT Comments  Pt seen for PT tx with pt agreeable. Pt is able to complete bed mobility without assistance by exiting R side of bed. Pt ambulates into hallway & back with hurry cane, c/o feeling SOB so elects to return to room & declined further gait, reporting he walked around the nurses station this AM. PT educates pt on pursed lip breathing with good return demo by pt. SpO2 90-98% on room air. Pt does note he feels like his chest is tightening up when he becomes SOB - nurse made aware. Will continue to follow pt acutely to progress gait with LRAD, balance, stair negotiation.    If plan is discharge home, recommend the following: A little help with walking and/or transfers;A little help with bathing/dressing/bathroom;Assistance with cooking/housework;Assist for transportation;Help with stairs or ramp for entrance   Can travel by private vehicle     Yes  Equipment Recommendations  None recommended by PT    Recommendations for Other Services       Precautions / Restrictions Precautions Precautions: Fall Required Braces or Orthoses: Splint/Cast;Sling Splint/Cast: L thumb spica and L arm sling Restrictions Weight Bearing Restrictions Per Provider Order: Yes LUE Weight Bearing Per Provider Order: Non weight bearing Other Position/Activity Restrictions: Left wrist spica splint after x-ray revealed severe arthritis. Pt to be NWB through L UE. Pt with nondisplaced radial head fracture.      Mobility  Bed Mobility Overal bed mobility: Needs Assistance Bed Mobility: Supine to Sit     Supine to sit: HOB elevated, Used rails, Supervision (exit R side of bed)          Transfers Overall transfer level: Needs assistance Equipment used: Straight cane Transfers: Sit to/from Stand Sit to Stand: Supervision           General transfer comment: sit>stand, 2 attempts for successful sit>Stand from EOB    Ambulation/Gait Ambulation/Gait assistance: Contact guard assist Gait Distance (Feet): 25 Feet Assistive device:  (hurry cane) Gait Pattern/deviations: Decreased step length - right, Decreased step length - left, Decreased stride length Gait velocity: decreased         Stairs             Wheelchair Mobility     Tilt Bed    Modified Rankin (Stroke Patients Only)       Balance Overall balance assessment: Needs assistance Sitting-balance support: Feet supported Sitting balance-Leahy Scale: Good     Standing balance support: Single extremity supported, Reliant on assistive device for balance, During functional activity Standing balance-Leahy Scale: Good                              Communication Communication Communication: No apparent difficulties  Cognition Arousal: Alert Behavior During Therapy: WFL for tasks assessed/performed   PT - Cognitive impairments: Memory                       PT - Cognition Comments: continues to  ask about x-rays for LUE despite MD reporting he's educated pt & PT attempting to educate pt Following commands: Intact Following commands impaired: Follows multi-step commands with increased time    Cueing Cueing Techniques: Verbal cues  Exercises      General Comments        Pertinent Vitals/Pain Pain Assessment Pain Assessment: No/denies pain    Home Living                          Prior Function            PT Goals (current goals can now be found in the care plan  section) Acute Rehab PT Goals Patient Stated Goal: get x-rays of his arm PT Goal Formulation: With patient Time For Goal Achievement: 06/12/24 Potential to Achieve Goals: Good Progress towards PT goals: Progressing toward goals    Frequency    Min 2X/week      PT Plan      Co-evaluation              AM-PAC PT 6 Clicks Mobility   Outcome Measure  Help needed turning from your back to your side while in a flat bed without using bedrails?: None Help needed moving from lying on your back to sitting on the side of a flat bed without using bedrails?: A Little Help needed moving to and from a bed to a chair (including a wheelchair)?: A Little Help needed standing up from a chair using your arms (e.g., wheelchair or bedside chair)?: A Little Help needed to walk in hospital room?: A Little Help needed climbing 3-5 steps with a railing? : A Little 6 Click Score: 19    End of Session   Activity Tolerance:  (limited by SOB, fatigue) Patient left: in chair;with chair alarm set;with call bell/phone within reach Nurse Communication:  (c/o symptoms during session) PT Visit Diagnosis: Unsteadiness on feet (R26.81);Muscle weakness (generalized) (M62.81)     Time: 8486-8471 PT Time Calculation (min) (ACUTE ONLY): 15 min  Charges:    $Therapeutic Activity: 8-22 mins PT General Charges $$ ACUTE PT VISIT: 1 Visit                     Richerd Pinal, PT, DPT 05/30/2024, 3:37 PM   Richerd CHRISTELLA Pinal 05/30/2024, 3:36 PM

## 2024-05-30 NOTE — Progress Notes (Signed)
 Heart Failure Stewardship Pharmacy Note  PCP: Vicci Duwaine SQUIBB, DO PCP-Cardiologist: Deatrice Cage, MD  HPI: Eugene Henderson is a 73 y.o. male with CHF, NICM, PH, AF, AAA, T2DM, hyperthyroidism, and COPD who presented with shortness of breath and lingering pain after a fall attempting to use the commode. On admission, proBNP was 1026, HS-troponin was 21, TSH was 1.62, free T4 was 1.58, and digoxin  level was <0.6. Chest x-ray noted no acute changes. CTA negative for PE, pulmonary trunk of 3.4 cm, and ascending aortic dialtion to 4.7 cm.   Pertinent cardiac history: TTE 07/2016 with LVEF of 25-30%. RHC at the same time noted CO of 4.16, CI of 1.77, RA 12 mmHg, PA 37/12 mmHg, mPAP 12 mmHg, and PCWP of 12 mmHG. LHC negative for significant ischemic disease. Admitted 06/2022 for shortness of breath and chest pain, found to be in AF RVR. 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 TTE at that time with LVEF previouly uimproved to 30-35% back down to 20-25%. Cardiac MRI 07/2022 showed LVEF 17%, RVEF 36%, small area of lateral wall subendocardial LGE, mid-wall LGE at this inferoseptal RV insertion site. Underwent and ultimately failed DCCV 09/2022. ICD placed 10/2022. LHC 12/2023 noted no obstructive CAD, CO was 6.24 and CI was 2.51, PVR was 6 WU. TTE 04/2024 with LVEF of 40-45%, mild LVH, moderate MR, moderate TR, moderate PR.  Pertinent Lab Values: Creatinine, Ser  Date Value Ref Range Status  05/30/2024 0.95 0.61 - 1.24 mg/dL Final   BUN  Date Value Ref Range Status  05/30/2024 12 8 - 23 mg/dL Final  90/95/7974 16 8 - 27 mg/dL Final   Potassium  Date Value Ref Range Status  05/30/2024 4.0 3.5 - 5.1 mmol/L Final    Comment:    HEMOLYSIS AT THIS LEVEL MAY AFFECT RESULT   Sodium  Date Value Ref Range Status  05/30/2024 129 (L) 135 - 145 mmol/L Final  02/09/2024 134 134 - 144 mmol/L Final   B Natriuretic Peptide  Date Value Ref Range Status  04/22/2023 228.5 (H) 0.0  - 100.0 pg/mL Final    Comment:    Performed at [facility], [address] [place], [place] [address]   BNP  Date Value Ref Range Status  03/06/2024 37.6 0.0 - 100.0 pg/mL Final    Comment:    [employer] ADVIA Centaur XP methodology   Magnesium   Date Value Ref Range Status  05/28/2024 1.7 1.7 - 2.4 mg/dL Final    Comment:    Performed at [facility], [address] [place], [place] [address]   HB A1C ([facility])  Date Value Ref Range Status  02/09/2024 6.9 (H) 4.8 - 5.6 % Final    Comment:             Prediabetes: 5.7 - 6.4          Diabetes: >6.4          Glycemic control for adults with diabetes: <7.0    Hgb A1c MFr Bld  Date Value Ref Range Status  05/02/2024 6.2 (H) 4.8 - 5.6 % Final    Comment:    (NOTE)         Prediabetes: 5.7 - 6.4         Diabetes: >6.4         Glycemic control for adults with diabetes: <7.0    Digoxin  Level  Date Value Ref Range Status  05/28/2024 <0.6 (  L) 0.8 - 2.0 ng/mL Final    Comment:    Performed at [facility], [address] [place], [place] [address]   TSH  Date Value Ref Range Status  05/29/2024 1.650 0.350 - 4.500 uIU/mL Final    Comment:    Performed at [facility], [address] [place], [place] [address]  02/09/2024 3.810 0.450 - 4.500 uIU/mL Final    Vital Signs: Temp:  [98 F (36.7 C)-99.2 F (37.3 C)] 98.4 F (36.9 C) (12/24 0413) Pulse Rate:  [57-137] 57 (12/24 0413) Cardiac Rhythm: Atrial fibrillation (12/24 0700) Resp:  [13-25] 20 (12/24 0413) BP: (97-143)/(60-94) 101/68 (12/24 0413) SpO2:  [73 %-100 %] 98 % (12/24 0413) Weight:  [112 kg (246 lb 14.6 oz)] 112 kg (246 lb 14.6 oz) (12/24 9375)  Intake/Output Summary (Last 24 hours) at 05/30/2024 0744 Last data filed at 05/30/2024 0650 Gross per 24 hour  Intake 720 ml  Output 1375 ml  Net -655 ml    Current Heart Failure Medications:  Loop diuretic: furosemide  40 mg PO daily Beta-Blocker:  metoprolol  succinate 50 mg dialy ACEI/ARB/ARNI: Entresto  49-51 mg BID MRA: spironolactone  25 mg daily SGLT2i: none Other: digoxin  0.0625 mg daily  Prior to admission Heart Failure Medications:  Loop diuretic: furosemide  40 mg daily Beta-Blocker: metoprolol  succinate 50 mg daily ACEI/ARB/ARNI: Entresto  49-51 mg BID (also noted on med list are losartan  and irbesartan ) MRA: spironolactone  25 mg daily SGLT2i: Jardiance  10 mg daily Other: digoxin  0.0625 mg daily  Assessment: 1. Acute on chronic systolic heart failure (LVEF 40-45%)  , due to NICM. NYHA class III symptoms.   -Volume: Volume difficult to assess due to body habitus and wide array of potential causes for symptoms. BNP on admission was elevated, but less than previous. Currently on furosemide  40 mg PO daily. -Hemodynamics: BP is variable. HR is improving. -BB: Metoprolol  succinate increased to 50 mg PO BID.  -ACEI/ARB/ARNI: Continue Entresto  49-51 mg BID.  -MRA: Continue spironolactone  25 mg daily -SGLT2i: Continue home Jardiance  10 mg daily. 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    Please do not hesitate to reach out with questions or concerns,  Jaun Bash, PharmD, CPP, BCPS, [facility] Heart Failure Pharmacist  Phone - [phone] 05/30/2024 7:44 AM

## 2024-05-30 NOTE — Plan of Care (Signed)

## 2024-05-30 NOTE — Progress Notes (Signed)
 "  Progress Note  Patient Name: Eugene Henderson Date of Encounter: 05/30/2024  Primary Cardiologist: Darliss Primary Electrophysiologist:  Cindie Advanced Heart Failure: Rolan  Subjective   No chest pain, dyspnea, or palpitations. Continues to report left elbow and wrist pain.   Inpatient Medications    Scheduled Meds:  apixaban   5 mg Oral BID   atorvastatin   40 mg Oral Daily   digoxin   62.5 mcg Oral Daily   empagliflozin   10 mg Oral Daily   furosemide   40 mg Oral Daily   insulin  aspart  0-5 Units Subcutaneous QHS   insulin  aspart  0-9 Units Subcutaneous TID WC   ipratropium-albuterol   3 mL Nebulization Q6H   methimazole   20 mg Oral BID   metoprolol  succinate  50 mg Oral BID   nicotine   21 mg Transdermal Daily   pantoprazole   40 mg Oral BID   predniSONE   40 mg Oral Q breakfast   sacubitril -valsartan   1 tablet Oral BID   spironolactone   25 mg Oral Daily   Continuous Infusions:  PRN Meds: acetaminophen , albuterol , dextromethorphan-guaiFENesin , diclofenac  Sodium, diphenhydrAMINE , hydrALAZINE , metoprolol  tartrate, nitroGLYCERIN , oxyCODONE    Vital Signs    Vitals:   05/30/24 0115 05/30/24 0413 05/30/24 0624 05/30/24 0816  BP:  101/68  109/89  Pulse:  (!) 57  96  Resp:  20  20  Temp:  98.4 F (36.9 C)  98.3 F (36.8 C)  TempSrc:    Oral  SpO2: 97% 98%  100%  Weight:   112 kg   Height:        Intake/Output Summary (Last 24 hours) at 05/30/2024 0859 Last data filed at 05/30/2024 0650 Gross per 24 hour  Intake 720 ml  Output 1375 ml  Net -655 ml   Filed Weights   05/28/24 1710 05/29/24 2235 05/30/24 0624  Weight: 117.9 kg 112 kg 112 kg    Telemetry    Afib with ventricular rates largely in the low 100s bpm with brief paroxysms into the 140s bpm - Personally Reviewed  ECG    No new tracings - Personally Reviewed  Physical Exam   GEN: No acute distress.   Neck: No JVD. Cardiac: IRIR, no murmurs, rubs, or gallops.  Respiratory: Mildly diminished  breath sounds bilaterally.  GI: Soft, nontender, non-distended.   MS: No edema; left wrist in splint and upper extremity in sling.  Neuro:  Alert and oriented x 3; Nonfocal.  Psych: Normal affect.  Labs    Chemistry Recent Labs  Lab 05/28/24 1727 05/29/24 0703 05/30/24 0335  NA 130* 133* 129*  K 4.2 3.2* 4.0  CL 92* 94* 92*  CO2 19* 26 23  GLUCOSE 152* 123* 120*  BUN 8 8 12   CREATININE 0.98 0.76 0.95  CALCIUM  9.3 8.9 8.7*  GFRNONAA >60 >60 >60  ANIONGAP 19* 12 14     Hematology Recent Labs  Lab 05/28/24 1727 05/29/24 0703  WBC 8.0 5.5  RBC 4.82 4.46  HGB 12.9* 12.2*  HCT 40.1 37.1*  MCV 83.2 83.2  MCH 26.8 27.4  MCHC 32.2 32.9  RDW 15.9* 15.9*  PLT 210 182    Cardiac EnzymesNo results for input(s): TROPONINI in the last 168 hours. No results for input(s): TROPIPOC in the last 168 hours.   BNP Recent Labs  Lab 05/28/24 1727  PROBNP 1,026.0*     DDimer No results for input(s): DDIMER in the last 168 hours.   Radiology    CT Angio Chest PE W/Cm &/Or Wo Cm  Result Date: 05/28/2024 IMPRESSION: 1. No evidence of pulmonary embolism to the segmental level; subsegmental arteries are poorly evaluated due to respiratory motion. 2. Enlarged pulmonary trunk measuring 3.4 cm, consistent with pulmonary arterial hypertension. Previously 3. Moderate cardiomegaly with biatrial chamber predominance, without pericardial effusion. 3-vessel coronary artery disease (cad). 4. Ascending aortic dilatation to 4.7 cm, without dissection or stenosis. Semiannual CTA or MRA follow-up and referral to cardiothoracic surgery recommended if not already done . 5. Mild centrilobular and paraseptal emphysema in the upper lobes; pulmonary emphysema is an independent risk factor for lung cancer, and consideration is recommended for evaluation for a low-dose CT lung cancer screening program. 6. No consolidation, suspicious nodule, or pleural effusion. Electronically signed by: Francis Quam MD  05/28/2024 10:04 PM EST RP Workstation: [id number]   DG Chest 2 View Result Date: 05/28/2024 IMPRESSION: No active cardiopulmonary disease. Cardiomegaly. Electronically Signed   By: Luke Bun M.D.   On: 05/28/2024 18:06    Cardiac Studies   2D echo 04/26/2024: 1. Left ventricular ejection fraction, by estimation, is 40 to 45%. The  left ventricle has mildly decreased function. Left ventricular endocardial  border not optimally defined to evaluate regional wall motion. The left  ventricular internal cavity size  was mildly dilated. There is mild left ventricular hypertrophy. Left  ventricular diastolic parameters are indeterminate. The global  longitudinal strain is abnormal.   2. Right ventricular systolic function is mildly reduced. The right  ventricular size is mildly enlarged. There is severely elevated pulmonary  artery systolic pressure. The estimated right ventricular systolic  pressure is 64.3 mmHg.   3. Left atrial size was severely dilated.   4. Right atrial size was severely dilated.   5. The mitral valve is degenerative. Moderate mitral valve regurgitation.   6. Tricuspid valve regurgitation is moderate.   7. The aortic valve is tricuspid. Aortic valve regurgitation is mild. No  aortic stenosis is present.   8. Pulmonic valve regurgitation is moderate.   9. Aortic dilatation noted. There is moderate dilatation of the aortic  root, measuring 48 mm. There is moderate dilatation of the ascending  aorta, measuring 45 mm.  10. The inferior vena cava is dilated in size with <50% respiratory  variability, suggesting right atrial pressure of 15 mmHg.   Comparison(s): A prior study was performed on 06/15/2022. LV function  appears improved.  __________   [facility] 12/16/2023: 1.  Minor irregularities with no evidence of obstructive coronary artery disease. 2.  Left ventricular angiography was not performed.  EF is known to be moderately to severely reduced. 3.  Right heart  catheterization showed moderately elevated right atrial pressure, moderate pulmonary hypertension and normal cardiac output.   RA: 14 mmHg RV: 49/11 mmHg PW: 15 mmHg PA: 51/23 with a mean of 35 mmHg Cardiac output is 6.24 with an index of 2.51.   Recommendations: The patient has nonischemic cardiomyopathy.  Recommend medical therapy. __________   See CV procedures in Epic for further cardiac imaging  Patient Profile     73 y.o. 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 Kandis.   Assessment & Plan    1. Permanent A-fib: - Patient has permanent A-fib with ventricular rates largely in the 90s to low 100s with brief paroxysms into the 120s  to 130s bpm - Newly started amiodarone  was discontinued on 12/23 as patient has permanent A-fib - Continue titrated dose of Toprol -XL 50 mg twice daily, relative hypotension precludes escalation of beta blocker - May need to consider decreasing Entresto  to allow for up-titration of Toprol  for added ventricular rate control  - Elevated rates driven by elbow pain - Remains on digoxin  0.625 mg daily, maintain potassium at goal 4.0 - CHA2DS2-VASc at least - PTA apixaban  5 mg twice daily   2.  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  Nonobstructive CAD with mildly elevated high-sensitivity troponin: - Not consistent with ACS - No indication for heparin  drip - No plans for inpatient ischemic evaluation at this time - Aggressive risk factor modification - PTA apixaban  in lieu of aspirin  - Atorvastatin  40 mg   4.  Left wrist and elbow pain: - Stemming from a mechanical fall approximately 4 weeks ago - Reports severe left wrist and elbow pain - This was the main reason for his presentation to the ED - Likely driving his Afib rates - Management per internal medicine/orthopedics   5.  HTN: - Pharmacotherapy as outlined above   6.  Hyperthyroidism: - TSH normal - PTA methimazole    7.  Hypokalemia/hypomagnesemia: - Replete potassium and magnesium  to goal to goal 4.0 and 2.0 respectively       For questions or updates, please contact [facility] Please consult [email or web address] for contact info under Cardiology/STEMI.    Signed, Bernardino Bring, PA-C [facility] Pager: [phone] 05/30/2024, 8:59 AM  "

## 2024-05-30 NOTE — Progress Notes (Signed)
 Mobility Specialist - Progress Note    05/30/24 1206  Mobility  Activity Ambulated with assistance;Stood at bedside  Level of Assistance Contact guard assist, steadying assist  Assistive Device Cane  Distance Ambulated (ft) 160 ft  Range of Motion/Exercises All extremities  Mobility visit 1 Mobility  Mobility Specialist Start Time (ACUTE ONLY) 1125  Mobility Specialist Stop Time (ACUTE ONLY) 1145  Mobility Specialist Time Calculation (min) (ACUTE ONLY) 20 min     Pt was ambulating in hallway with nurse and continued with mobility. Pt agreed to continued mobility. Pt HR was an area of concern so activity was cut short. After activity pt returned to the room at the EOB on RA. Pt requested to use bathroom after activity. After BM pt returned to the EOB with needs in reach and nurse in room upon exit.  Clem Rodes Mobility Specialist 05/30/2024, 12:10 PM

## 2024-05-30 NOTE — Progress Notes (Signed)
 " PROGRESS NOTE    Eugene Henderson  FMW:[id number] DOB: July 05, 1950 DOA: 05/28/2024 PCP: Louder Duwaine SQUIBB, DO  Outpatient Specialists: cardiology    Brief Narrative:   From admission h and p   Eugene Henderson is a 74 y.o. male with medical history significant of A fib on Eliquis , sCHF with EF 40-45%, HTN, HLD, DM, COPD, tobacco abuse, obesity, hyperthyroidism, medication noncompliance, who presents with SOB.   Patient was recently hospitalized due to A-fib with RVR, COPD and CHF exacerbation, also had nondisplaced fracture of the lateral aspect of the radial head of left shoulder. Pt was discharged to SNF on 3L of oxygen.  Patient states that he was discharged home yesterday with empty oxygen tank from SNF. He states that he took his medications yesterday but not today. He states that he continues to have dry cough and SOB.  He also reports some mild substernal chest pain.  No fever or chills.  No nausea, vomiting, diarrhea or abdominal pain.  No symptoms of UTI.  Assessment & Plan:   Principal Problem:   Atrial fibrillation with RVR (HCC) Active Problems:   Chronic systolic CHF (congestive heart failure) (HCC)   COPD (chronic obstructive pulmonary disease) (HCC)   Essential hypertension   Hyperlipidemia   Diabetes mellitus with cardiac complication (HCC)   Hyperthyroidism   Tobacco use   Obesity (BMI 30-39.9)   Alcohol abuse   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)  # A-fib rvr Cardiology thinks now permanent and so holding amio. 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 Covid/flu neg - incruse/brovana  for home stioloto - hold additional prednisone   # Hyperthyroid TFTs wnl - cont home methimazole   # Debility Recently completed rehab stay but doesn't feel able to function at home - PT advising snf; TOC consulted for more rehab, has auth, bed available 12/26   DVT prophylaxis: n/a (therapeutic anticoagulation) Code Status: full Family Communication: daughter wendi telephonically 12/23  Level of care: Progressive Status is: Inpatient Remains inpatient appropriate because: severity of illness    Consultants:  Cardiology (signed off)  Procedures: none  Antimicrobials:  none    Subjective: Reports stable dyspnea and stable left elbow and wrist pain  Objective: Vitals:   05/30/24 0413 05/30/24 0624 05/30/24 0816 05/30/24 1203  BP: 101/68  109/89 126/83  Pulse: (!) 57  96 (!) 54  Resp: 20  20 18   Temp: 98.4 F (36.9 C)  98.3 F (36.8 C) 98 F (36.7 C)  TempSrc:   Oral   SpO2: 98%  100% 100%  Weight:  112 kg    Height:        Intake/Output Summary (Last 24 hours) at 05/30/2024 1556 Last data filed at 05/30/2024 1421 Gross per 24 hour  Intake 960 ml  Output 925 ml  Net 35 ml   Filed Weights   05/28/24 1710  05/29/24 2235 05/30/24 0624  Weight: 117.9 kg 112 kg 112 kg    Examination:  General exam: Appears calm and comfortable  Respiratory system: no wheeze, normal wob Cardiovascular system: S1 & S2 heard, irreg irreg, no murmur Gastrointestinal system: Abdomen is obese, soft and nontender.   Central nervous system: Alert and oriented. No focal neurological deficits. Extremities: Symmetric 5 x 5 power. Skin: No rashes, lesions or ulcers Psychiatry: calm    Data Reviewed: I have personally reviewed following  labs and imaging studies  CBC: Recent Labs  Lab 05/28/24 1727 05/29/24 0703  WBC 8.0 5.5  HGB 12.9* 12.2*  HCT 40.1 37.1*  MCV 83.2 83.2  PLT 210 182   Basic Metabolic Panel: Recent Labs  Lab 05/28/24 1727 05/28/24 1935 05/29/24 0703 05/30/24 0335  NA 130*  --  133* 129*  K 4.2  --  3.2* 4.0  CL 92*  --  94* 92*  CO2 19*  --  26 23  GLUCOSE 152*  --  123* 120*  BUN 8  --  8 12  CREATININE 0.98  --  0.76 0.95  CALCIUM  9.3  --  8.9 8.7*  MG  --  1.7  --   --    GFR: Estimated Creatinine Clearance: 89.5 mL/min (by C-G formula based on SCr of 0.95 mg/dL). Liver Function Tests: No results for input(s): AST, ALT, ALKPHOS, BILITOT, PROT, ALBUMIN in the last 168 hours. No results for input(s): LIPASE, AMYLASE in the last 168 hours. No results for input(s): AMMONIA in the last 168 hours. Coagulation Profile: No results for input(s): INR, PROTIME in the last 168 hours. Cardiac Enzymes: No results for input(s): CKTOTAL, CKMB, CKMBINDEX, TROPONINI in the last 168 hours. BNP (last 3 results) Recent Labs    05/01/24 1257 05/28/24 1727  PROBNP 5,875.0* 1,026.0*   HbA1C: No results for input(s): HGBA1C in the last 72 hours. CBG: Recent Labs  Lab 05/29/24 1158 05/29/24 1618 05/29/24 2249 05/30/24 0902 05/30/24 1209  GLUCAP 127* 139* 117* 134* 174*   Lipid Profile: No results for input(s): CHOL, HDL, LDLCALC, TRIG, CHOLHDL, LDLDIRECT in the last 72 hours. Thyroid  Function Tests: Recent Labs    05/29/24 0703  TSH 1.650  FREET4 1.58   Anemia Panel: No results for input(s): VITAMINB12, FOLATE, FERRITIN, TIBC, IRON, RETICCTPCT in the last 72 hours. Urine analysis:    Component Value Date/Time   COLORURINE YELLOW (A) 04/13/2024 0025   APPEARANCEUR CLOUDY (A) 04/13/2024 0025   APPEARANCEUR Cloudy (A) 11/09/2023 0928   LABSPEC 1.010 04/13/2024 0025   PHURINE 5.0 04/13/2024 0025   GLUCOSEU >=500 (A) 04/13/2024  0025   HGBUR LARGE (A) 04/13/2024 0025   BILIRUBINUR NEGATIVE 04/13/2024 0025   BILIRUBINUR negative 01/04/2024 1545   BILIRUBINUR Negative 11/09/2023 0928   KETONESUR NEGATIVE 04/13/2024 0025   PROTEINUR NEGATIVE 04/13/2024 0025   UROBILINOGEN 0.2 01/04/2024 1545   NITRITE NEGATIVE 04/13/2024 0025   LEUKOCYTESUR MODERATE (A) 04/13/2024 0025   Sepsis Labs: @LABRCNTIP (procalcitonin:4,lacticidven:4)  ) Recent Results (from the past 240 hours)  Resp panel by RT-PCR (RSV, Flu A&B, Covid) Anterior Nasal Swab     Status: None   Collection Time: 05/28/24  7:35 PM   Specimen: Anterior Nasal Swab  Result Value Ref Range Status   SARS Coronavirus 2 by RT PCR NEGATIVE NEGATIVE Final    Comment: (NOTE) SARS-CoV-2 target nucleic acids are NOT DETECTED.  The SARS-CoV-2 RNA is generally detectable in upper respiratory specimens during the acute phase of infection. The  lowest concentration of SARS-CoV-2 viral copies this assay can detect is 138 copies/mL. A negative result does not preclude SARS-Cov-2 infection and should not be used as the sole basis for treatment or other patient management decisions. A negative result may occur with  improper specimen collection/handling, submission of specimen other than nasopharyngeal swab, presence of viral mutation(s) within the areas targeted by this assay, and inadequate number of viral copies(<138 copies/mL). A negative result must be combined with clinical observations, patient history, and epidemiological information. The expected result is Negative.  Fact Sheet for Patients:  bloggercourse.com  Fact Sheet for Healthcare Providers:  seriousbroker.it  This test is no t yet approved or cleared by the [place]  FDA and  has been authorized for detection and/or diagnosis of SARS-CoV-2 by FDA under an Emergency Use Authorization (EUA). This EUA will remain  in effect (meaning this test can be  used) for the duration of the COVID-19 declaration under Section 564(b)(1) of the Act, 21 U.S.C.section 360bbb-3(b)(1), unless the authorization is terminated  or revoked sooner.       Influenza A by PCR NEGATIVE NEGATIVE Final   Influenza B by PCR NEGATIVE NEGATIVE Final    Comment: (NOTE) The Xpert Xpress SARS-CoV-2/FLU/RSV plus assay is intended as an aid in the diagnosis of influenza from Nasopharyngeal swab specimens and should not be used as a sole basis for treatment. Nasal washings and aspirates are unacceptable for Xpert Xpress SARS-CoV-2/FLU/RSV testing.  Fact Sheet for Patients: bloggercourse.com  Fact Sheet for Healthcare Providers: seriousbroker.it  This test is not yet approved or cleared by the [place]  FDA and has been authorized for detection and/or diagnosis of SARS-CoV-2 by FDA under an Emergency Use Authorization (EUA). This EUA will remain in effect (meaning this test can be used) for the duration of the COVID-19 declaration under Section 564(b)(1) of the Act, 21 U.S.C. section 360bbb-3(b)(1), unless the authorization is terminated or revoked.     Resp Syncytial Virus by PCR NEGATIVE NEGATIVE Final    Comment: (NOTE) Fact Sheet for Patients: bloggercourse.com  Fact Sheet for Healthcare Providers: seriousbroker.it  This test is not yet approved or cleared by the [place]  FDA and has been authorized for detection and/or diagnosis of SARS-CoV-2 by FDA under an Emergency Use Authorization (EUA). This EUA will remain in effect (meaning this test can be used) for the duration of the COVID-19 declaration under Section 564(b)(1) of the Act, 21 U.S.C. section 360bbb-3(b)(1), unless the authorization is terminated or revoked.  Performed at [facility], [address] [place], [place] [address]          Radiology Studies: CT Angio  Chest PE W/Cm &/Or Wo Cm Result Date: 05/28/2024 EXAM: CTA CHEST 05/28/2024 09:29:47 PM TECHNIQUE: CTA of the chest was performed after the administration of intravenous contrast. Multiplanar reformatted images are provided for review. MIP images are provided for review. Automated exposure control, iterative reconstruction, and/or weight based adjustment of the mA/kV was utilized to reduce the radiation dose to as low as reasonably achievable. COMPARISON: AP and lateral chest for today, CTA chest 05/04/2024, CTA chest 06/14/2022. CLINICAL HISTORY: Pulmonary embolism (PE) suspected, high prob. FINDINGS: PULMONARY ARTERIES: Pulmonary arteries are adequately opacified for evaluation. Enlarged pulmonary trunk measures 3.4 cm, consistent with arterial hypertension. No arterial embolism is seen to the segmental level, the subsegmental arterial bed is poorly evaluated due to respiratory motion. MEDIASTINUM: There is metal artifact from a single lead left sided cardiac assist device, the tip of the wire is  in the right ventricle. There is moderate cardiomegaly with biatrial chamber predominance. No pericardial effusion. Scattered three-vessel coronary calcifications noted. There is aortic tortuosity, patchy atherosclerosis, and ascending aortic dilatation to 4.7 cm as before. Scattered calcifications in the great vessels. There is no dissection or stenosis. LYMPH NODES: There are mildly prominent mediastinal and bilateral hilar lymph nodes with some showing calcifications. Examples include right paratracheal chain nodes up to 1.2 cm, pretracheal lymph nodes up to 1 cm with precarinal and subcarinal lymph nodes to 1.3 cm. Largest right hilar lymph node noted is 1.3 cm. Axillary spaces are clear. LUNGS AND PLEURA: There are mild centrilobular and paraseptal emphysema changes in the upper lobes. Diffuse bronchial thickening. There are posterior atelectatic changes in the lungs, but no consolidation, suspicious nodule or  effusion. The trachea and central airways are clear. No pneumothorax. UPPER ABDOMEN: In the abdomen, there is a nodular liver contour compatible with cirrhosis but no splenomegaly or ascites. There is fatty infiltration in the pancreas. No acute upper abdominal findings. SOFT TISSUES AND BONES: Degenerative changes in the thoracic spine. No acute or significant osseous findings. Bilateral acromiohumeral abutment is chronically seen consistent with chronic rotator cuff arthropathy with likely degenerative tears. IMPRESSION: 1. No evidence of pulmonary embolism to the segmental level; subsegmental arteries are poorly evaluated due to respiratory motion. 2. Enlarged pulmonary trunk measuring 3.4 cm, consistent with pulmonary arterial hypertension. Previously 3. Moderate cardiomegaly with biatrial chamber predominance, without pericardial effusion. 3-vessel coronary artery disease (cad). 4. Ascending aortic dilatation to 4.7 cm, without dissection or stenosis. Semiannual CTA or MRA follow-up and referral to cardiothoracic surgery recommended if not already done . 5. Mild centrilobular and paraseptal emphysema in the upper lobes; pulmonary emphysema is an independent risk factor for lung cancer, and consideration is recommended for evaluation for a low-dose CT lung cancer screening program. 6. No consolidation, suspicious nodule, or pleural effusion. Electronically signed by: Francis Quam MD 05/28/2024 10:04 PM EST RP Workstation: [id number]   DG Chest 2 View Result Date: 05/28/2024 CLINICAL DATA:  Shortness of breath EXAM: CHEST - 2 VIEW COMPARISON:  05/01/2024 FINDINGS: Left-sided single lead pacing device as before. Cardiomegaly without focal airspace disease, pleural effusion or pneumothorax. Minimal atelectasis or scarring at the lingula. IMPRESSION: No active cardiopulmonary disease. Cardiomegaly. Electronically Signed   By: Luke Bun M.D.   On: 05/28/2024 18:06        Scheduled Meds:  apixaban   5  mg Oral BID   atorvastatin   40 mg Oral Daily   digoxin   62.5 mcg Oral Daily   empagliflozin   10 mg Oral Daily   furosemide   40 mg Oral Daily   insulin  aspart  0-5 Units Subcutaneous QHS   insulin  aspart  0-9 Units Subcutaneous TID WC   ipratropium-albuterol   3 mL Nebulization Q6H   methimazole   20 mg Oral BID   metoprolol  succinate  50 mg Oral BID   nicotine   21 mg Transdermal Daily   pantoprazole   40 mg Oral BID   predniSONE   40 mg Oral Q breakfast   sacubitril -valsartan   1 tablet Oral BID   spironolactone   25 mg Oral Daily   Continuous Infusions:   LOS: 1 day     Devaughn KATHEE Ban, MD [facility]   If 7PM-7AM, please contact night-coverage [email or web address] Password [email or web address] 05/30/2024, 3:56 PM     "

## 2024-05-31 DIAGNOSIS — I4891 Unspecified atrial fibrillation: Secondary | ICD-10-CM | POA: Diagnosis not present

## 2024-05-31 LAB — GLUCOSE, CAPILLARY
Glucose-Capillary: 122 mg/dL — ABNORMAL HIGH (ref 70–99)
Glucose-Capillary: 116 mg/dL — ABNORMAL HIGH (ref 70–99)
Glucose-Capillary: 137 mg/dL — ABNORMAL HIGH (ref 70–99)
Glucose-Capillary: 120 mg/dL — ABNORMAL HIGH (ref 70–99)

## 2024-05-31 LAB — MAGNESIUM: Magnesium: 2.4 mg/dL (ref 1.7–2.4)

## 2024-05-31 NOTE — Progress Notes (Signed)
 " PROGRESS NOTE    Eugene Henderson  FMW:[id number] DOB: 1951/05/12 DOA: 05/28/2024 PCP: Louder Duwaine SQUIBB, DO  Outpatient Specialists: cardiology    Brief Narrative:   From admission h and p   Eugene Henderson is a 73 y.o. male with medical history significant of A fib on Eliquis , sCHF with EF 40-45%, HTN, HLD, DM, COPD, tobacco abuse, obesity, hyperthyroidism, medication noncompliance, who presents with SOB.   Patient was recently hospitalized due to A-fib with RVR, COPD and CHF exacerbation, also had nondisplaced fracture of the lateral aspect of the radial head of left shoulder. Pt was discharged to SNF on 3L of oxygen.  Patient states that he was discharged home yesterday with empty oxygen tank from SNF. He states that he took his medications yesterday but not today. He states that he continues to have dry cough and SOB.  He also reports some mild substernal chest pain.  No fever or chills.  No nausea, vomiting, diarrhea or abdominal pain.  No symptoms of UTI.  Assessment & Plan:   Principal Problem:   Atrial fibrillation with RVR (HCC) Active Problems:   Chronic systolic CHF (congestive heart failure) (HCC)   COPD (chronic obstructive pulmonary disease) (HCC)   Essential hypertension   Hyperlipidemia   Diabetes mellitus with cardiac complication (HCC)   Hyperthyroidism   Tobacco use   Obesity (BMI 30-39.9)   Alcohol abuse   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)  # A-fib rvr Cardiology thinks now permanent and so discontinuing amio. 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 Covid/flu neg - incruse/brovana  for home stioloto  # Hyperthyroid TFTs wnl - cont home methimazole   # Debility Recently completed rehab stay but doesn't feel able to function at home - PT advising snf; TOC consulted for more rehab, has auth, bed available 12/26   DVT prophylaxis: n/a (therapeutic anticoagulation) Code Status: full Family Communication: daughter wendi telephonically 12/25  Level of care: Telemetry Status is: Inpatient Remains inpatient appropriate because: severity of illness    Consultants:  Cardiology (signed off)  Procedures: none  Antimicrobials:  none    Subjective: Denies chest pain, palpitations, dyspnea. Reports stable left elbow and wrist pain  Objective: Vitals:   05/31/24 0500 05/31/24 0722 05/31/24 0811 05/31/24 1210  BP:   108/71 (!) 104/91  Pulse:   95 88  Resp:   18 18  Temp:   97.8 F (36.6 C) 98.2 F (36.8 C)  TempSrc:   Oral Oral  SpO2:  99% 95% 97%  Weight: 114 kg     Height:        Intake/Output Summary (Last 24 hours) at 05/31/2024 1413 Last data filed at 05/31/2024 1408 Gross per 24 hour  Intake 856 ml  Output 1050 ml  Net -194 ml   Filed Weights   05/29/24 2235  05/30/24 0624 05/31/24 0500  Weight: 112 kg 112 kg 114 kg    Examination:  General exam: Appears calm and comfortable  Respiratory system: no wheeze, normal wob Cardiovascular system: S1 & S2 heard, irreg irreg, no murmur Gastrointestinal system: Abdomen is obese, soft and nontender.   Central nervous system: Alert and oriented. No focal neurological deficits. Extremities: Symmetric 5 x 5 power. Skin: No rashes, lesions or ulcers Psychiatry: calm    Data Reviewed: I have personally reviewed  following labs and imaging studies  CBC: Recent Labs  Lab 05/28/24 1727 05/29/24 0703  WBC 8.0 5.5  HGB 12.9* 12.2*  HCT 40.1 37.1*  MCV 83.2 83.2  PLT 210 182   Basic Metabolic Panel: Recent Labs  Lab 05/28/24 1727 05/28/24 1935 05/29/24 0703 05/30/24 0335  NA 130*  --  133* 129*  K 4.2  --  3.2* 4.0  CL 92*  --  94* 92*  CO2 19*  --  26 23  GLUCOSE 152*  --  123* 120*  BUN 8  --  8 12  CREATININE 0.98  --  0.76 0.95  CALCIUM  9.3  --  8.9 8.7*  MG  --  1.7  --   --    GFR: Estimated Creatinine Clearance: 90.3 mL/min (by C-G formula based on SCr of 0.95 mg/dL). Liver Function Tests: No results for input(s): AST, ALT, ALKPHOS, BILITOT, PROT, ALBUMIN in the last 168 hours. No results for input(s): LIPASE, AMYLASE in the last 168 hours. No results for input(s): AMMONIA in the last 168 hours. Coagulation Profile: No results for input(s): INR, PROTIME in the last 168 hours. Cardiac Enzymes: No results for input(s): CKTOTAL, CKMB, CKMBINDEX, TROPONINI in the last 168 hours. BNP (last 3 results) Recent Labs    05/01/24 1257 05/28/24 1727  PROBNP 5,875.0* 1,026.0*   HbA1C: No results for input(s): HGBA1C in the last 72 hours. CBG: Recent Labs  Lab 05/30/24 1209 05/30/24 1807 05/30/24 2001 05/31/24 0803 05/31/24 1213  GLUCAP 174* 239* 145* 122* 116*   Lipid Profile: No results for input(s): CHOL, HDL, LDLCALC, TRIG, CHOLHDL, LDLDIRECT in the last 72 hours. Thyroid  Function Tests: Recent Labs    05/29/24 0703  TSH 1.650  FREET4 1.58   Anemia Panel: No results for input(s): VITAMINB12, FOLATE, FERRITIN, TIBC, IRON, RETICCTPCT in the last 72 hours. Urine analysis:    Component Value Date/Time   COLORURINE YELLOW (A) 04/13/2024 0025   APPEARANCEUR CLOUDY (A) 04/13/2024 0025   APPEARANCEUR Cloudy (A) 11/09/2023 0928   LABSPEC 1.010 04/13/2024 0025   PHURINE 5.0 04/13/2024 0025   GLUCOSEU >=500 (A)  04/13/2024 0025   HGBUR LARGE (A) 04/13/2024 0025   BILIRUBINUR NEGATIVE 04/13/2024 0025   BILIRUBINUR negative 01/04/2024 1545   BILIRUBINUR Negative 11/09/2023 0928   KETONESUR NEGATIVE 04/13/2024 0025   PROTEINUR NEGATIVE 04/13/2024 0025   UROBILINOGEN 0.2 01/04/2024 1545   NITRITE NEGATIVE 04/13/2024 0025   LEUKOCYTESUR MODERATE (A) 04/13/2024 0025   Sepsis Labs: @LABRCNTIP (procalcitonin:4,lacticidven:4)  ) Recent Results (from the past 240 hours)  Resp panel by RT-PCR (RSV, Flu A&B, Covid) Anterior Nasal Swab     Status: None   Collection Time: 05/28/24  7:35 PM   Specimen: Anterior Nasal Swab  Result Value Ref Range Status   SARS Coronavirus 2 by RT PCR NEGATIVE NEGATIVE Final    Comment: (NOTE) SARS-CoV-2 target nucleic acids are NOT DETECTED.  The SARS-CoV-2 RNA is generally detectable in upper respiratory specimens during the acute phase of infection. The  lowest concentration of SARS-CoV-2 viral copies this assay can detect is 138 copies/mL. A negative result does not preclude SARS-Cov-2 infection and should not be used as the sole basis for treatment or other patient management decisions. A negative result may occur with  improper specimen collection/handling, submission of specimen other than nasopharyngeal swab, presence of viral mutation(s) within the areas targeted by this assay, and inadequate number of viral copies(<138 copies/mL). A negative result must be combined with clinical observations, patient history, and epidemiological information. The expected result is Negative.  Fact Sheet for Patients:  bloggercourse.com  Fact Sheet for Healthcare Providers:  seriousbroker.it  This test is no t yet approved or cleared by the [place]  FDA and  has been authorized for detection and/or diagnosis of SARS-CoV-2 by FDA under an Emergency Use Authorization (EUA). This EUA will remain  in effect (meaning this  test can be used) for the duration of the COVID-19 declaration under Section 564(b)(1) of the Act, 21 U.S.C.section 360bbb-3(b)(1), unless the authorization is terminated  or revoked sooner.       Influenza A by PCR NEGATIVE NEGATIVE Final   Influenza B by PCR NEGATIVE NEGATIVE Final    Comment: (NOTE) The Xpert Xpress SARS-CoV-2/FLU/RSV plus assay is intended as an aid in the diagnosis of influenza from Nasopharyngeal swab specimens and should not be used as a sole basis for treatment. Nasal washings and aspirates are unacceptable for Xpert Xpress SARS-CoV-2/FLU/RSV testing.  Fact Sheet for Patients: bloggercourse.com  Fact Sheet for Healthcare Providers: seriousbroker.it  This test is not yet approved or cleared by the [place]  FDA and has been authorized for detection and/or diagnosis of SARS-CoV-2 by FDA under an Emergency Use Authorization (EUA). This EUA will remain in effect (meaning this test can be used) for the duration of the COVID-19 declaration under Section 564(b)(1) of the Act, 21 U.S.C. section 360bbb-3(b)(1), unless the authorization is terminated or revoked.     Resp Syncytial Virus by PCR NEGATIVE NEGATIVE Final    Comment: (NOTE) Fact Sheet for Patients: bloggercourse.com  Fact Sheet for Healthcare Providers: seriousbroker.it  This test is not yet approved or cleared by the [place]  FDA and has been authorized for detection and/or diagnosis of SARS-CoV-2 by FDA under an Emergency Use Authorization (EUA). This EUA will remain in effect (meaning this test can be used) for the duration of the COVID-19 declaration under Section 564(b)(1) of the Act, 21 U.S.C. section 360bbb-3(b)(1), unless the authorization is terminated or revoked.  Performed at [facility], [address], [place], [place] [address]          Radiology  Studies: No results found.       Scheduled Meds:  apixaban   5 mg Oral BID   atorvastatin   40 mg Oral Daily   digoxin   62.5 mcg Oral Daily   empagliflozin   10 mg Oral Daily   furosemide   40 mg Oral Daily   insulin  aspart  0-5 Units Subcutaneous QHS   insulin  aspart  0-9 Units Subcutaneous TID WC   methimazole   20 mg Oral BID   metoprolol  succinate  50 mg Oral BID   nicotine   21 mg Transdermal Daily   pantoprazole   40 mg Oral BID   sacubitril -valsartan   1 tablet Oral BID   spironolactone   25 mg Oral Daily   Continuous Infusions:   LOS: 2 days     Devaughn KATHEE Ban, MD [facility]   If 7PM-7AM, please contact night-coverage [email or web address] Password [facility] 05/31/2024, 2:13  PM     "

## 2024-05-31 NOTE — Progress Notes (Addendum)
 Report given to nurse on 1A. Pt being transferred to 1A 37. Ivs intact. Pt has belongings. Pt tele monitor off, D/C tele

## 2024-05-31 NOTE — Progress Notes (Signed)
 2202: Tele tech notified RN patient had 5 beat run of V Tach. This RN assessed patient and he denied any chest pain, pressure, or palpitations. Patient not in any signs of distress, alert and oriented x4. Patient in bed watching tv and able to communicate needs. MD notified of event, magnesium  lab ordered.

## 2024-05-31 NOTE — Plan of Care (Signed)
   Problem: Coping: Goal: Ability to adjust to condition or change in health will improve Outcome: Progressing   Problem: Fluid Volume: Goal: Ability to maintain a balanced intake and output will improve Outcome: Progressing   Problem: Health Behavior/Discharge Planning: Goal: Ability to identify and utilize available resources and services will improve Outcome: Progressing

## 2024-05-31 NOTE — Progress Notes (Signed)
 Mobility Specialist - Progress Note    05/31/24 1400  Mobility  Activity Refused and notified nurse if applicable  Mobility Specialist Start Time (ACUTE ONLY) 1349  Mobility Specialist Stop Time (ACUTE ONLY) 1353  Mobility Specialist Time Calculation (min) (ACUTE ONLY) 4 min   Pt was supine in bed with the HOB elevated on RA pt refused mobility session. Pt states he used his left hand and pain is present. Needs are in reach.  Clem Rodes Mobility Specialist 05/31/2024, 2:02 PM

## 2024-06-01 DIAGNOSIS — I4891 Unspecified atrial fibrillation: Secondary | ICD-10-CM | POA: Diagnosis not present

## 2024-06-01 LAB — GLUCOSE, CAPILLARY
Glucose-Capillary: 118 mg/dL — ABNORMAL HIGH (ref 70–99)
Glucose-Capillary: 137 mg/dL — ABNORMAL HIGH (ref 70–99)

## 2024-06-01 MED ORDER — OXYCODONE HCL 5 MG PO TABS: 5.0000 mg | ORAL_TABLET | Freq: Four times a day (QID) | ORAL | 0 refills | Status: DC | PRN

## 2024-06-01 MED ORDER — METOPROLOL SUCCINATE ER 50 MG PO TB24: 50.0000 mg | ORAL_TABLET | Freq: Two times a day (BID) | ORAL | Status: AC

## 2024-06-01 NOTE — Care Plan (Signed)
 DC instructions given to [facility], [employer] received report.  All questions answered, no other concerns at this time.  Iv removed Packed all belongings.

## 2024-06-01 NOTE — Plan of Care (Signed)
" °  Problem: Metabolic: Goal: Ability to maintain appropriate glucose levels will improve Outcome: Progressing   Problem: Education: Goal: Knowledge of General Education information will improve Description: Including pain rating scale, medication(s)/side effects and non-pharmacologic comfort measures Outcome: Progressing   Problem: Health Behavior/Discharge Planning: Goal: Ability to manage health-related needs will improve Outcome: Progressing   Problem: Clinical Measurements: Goal: Respiratory complications will improve Outcome: Progressing   Problem: Activity: Goal: Risk for activity intolerance will decrease Outcome: Progressing   Problem: Pain Managment: Goal: General experience of comfort will improve and/or be controlled Outcome: Progressing   "

## 2024-06-01 NOTE — Plan of Care (Signed)

## 2024-06-01 NOTE — TOC Transition Note (Signed)
 Transition of Care [facility]) - Discharge Note   Patient Details  Name: Eugene Henderson MRN: [id number] Date of Birth: 09/28/1950  [facility] CM/SW Contact:  Nathanael CHRISTELLA Ring, RN Phone Number: 06/01/2024, 10:17 AM   Clinical Narrative:    Patient is ready to discharge to [facility] in [place].  Daughter and patient updated on discharge for today.  Patient is going to room 309, bedside RN to call report to [phone].  [facility] called and arranged for transport.  They should be here within the next hour.    Final next level of care: Skilled Nursing Facility Barriers to Discharge: No Barriers Identified   Patient Goals and CMS Choice            Discharge Placement              Patient chooses bed at: Other - please specify in the comment section below: ([facility]) Patient to be transferred to facility by: [facility] Name of family member notified: Wendi/ Self Patient and family notified of of transfer: 06/01/24  Discharge Plan and Services Additional resources added to the After Visit Summary for                                       Social Drivers of Health (SDOH) Interventions SDOH Screenings   Food Insecurity: No Food Insecurity (05/29/2024)  Housing: Unknown (05/30/2024)  Transportation Needs: No Transportation Needs (05/29/2024)  Recent Concern: Transportation Needs - Unmet Transportation Needs (03/27/2024)  Utilities: Not At Risk (05/30/2024)  Alcohol Screen: Low Risk (07/27/2022)  Depression (PHQ2-9): Medium Risk (04/16/2024)  Financial Resource Strain: Low Risk (03/27/2024)  Physical Activity: Inactive (07/27/2022)  Social Connections: Socially Isolated (05/30/2024)  Stress: No Stress Concern Present (07/27/2022)  Tobacco Use: Medium Risk (05/29/2024)     Readmission Risk Interventions    06/16/2022    3:33 PM  Readmission Risk Prevention Plan  Transportation Screening Complete  PCP or Specialist Appt within 3-5 Days --   HRI or Home Care Consult Complete  Social Work Consult for Recovery Care Planning/Counseling Complete  Palliative Care Screening Not Applicable  Medication Review Oceanographer) Complete

## 2024-06-01 NOTE — Progress Notes (Signed)
 PT Cancellation Note  Patient Details Name: Eugene Henderson MRN: [id number] DOB: 27-Jan-1951   Cancelled Treatment:     PT attempt. Pt politely refused. Im going to the rehab center today. I'd rather just save my energy up for getting over there later. Acute PT will continue to follow until pt is Dc'd from acute hospital.    Rankin KATHEE Essex 06/01/2024, 8:50 AM

## 2024-06-01 NOTE — Discharge Summary (Addendum)
 Eugene Henderson FMW:[id number] DOB: [birth year] DOA: 05/28/2024  PCP: Louder Duwaine SQUIBB, DO  Admit date: 05/28/2024 Discharge date: 06/01/2024  Time spent: 35 minutes  Recommendations for Outpatient Follow-up:  Chf clinic f/u 1/15 Cardiology f/u Mars[facility]) Orthopedics f/u as scheduled Pcp f/u  Strongly advise only continuing opioids for a few more days    Discharge Diagnoses:  Principal Problem:   Atrial fibrillation with RVR (HCC) Active Problems:   Chronic systolic CHF (congestive heart failure) (HCC)   COPD (chronic obstructive pulmonary disease) (HCC)   Essential hypertension   Hyperlipidemia   Diabetes mellitus with cardiac complication (HCC)   Hyperthyroidism   Tobacco use   Obesity (BMI 30-39.9)   Alcohol abuse   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)   Discharge Condition: stable  Diet recommendation: heart healthy  Filed Weights   05/30/24 0624 05/31/24 0500 06/01/24 0500  Weight: 112 kg 114 kg 115.8 kg    History of present illness:  From admission h and p Eugene Henderson is a 73 y.o. male with medical history significant of A fib on Eliquis , sCHF with EF 40-45%, HTN, HLD, DM, COPD, tobacco abuse, obesity, hyperthyroidism, medication noncompliance, who presents with SOB.   Patient was recently hospitalized due to A-fib with RVR, COPD and CHF exacerbation, also had nondisplaced fracture of the lateral aspect of the radial head of left shoulder. Pt was discharged to SNF on 3L of oxygen.  Patient states that he was discharged home yesterday with empty oxygen tank from SNF. He states that he took his medications yesterday but not today. He states that he continues to have dry cough and SOB.  He also reports some mild substernal chest pain.  No fever or chills.  No nausea, vomiting, diarrhea or abdominal pain.  No symptoms of UTI.  Hospital Course:   Patient presents with SOB. Found to be in mild a-fib rvr. Cardiology deemed this to be permanent condition so  stopped home amio and up-titrated home metoprolol . RVR resolved. Dyspnea resolved, breathing comfortably on room air. 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 PT advised SNF so this was arranged, discharged there. Will need f/u with chf clinic and cardiology, also f/u with orthopedics. Per ortho, plan for the radial head fracture is: nonsurgical treatment of this injury with sling immobilization but performing range of motion exercises as pain allows, ice/compression, topical pain cream, medications, occupational therapy.  Patient has been taking oxycodone  regularly since discharge on 12/4 for his radial head fracture. Here we reduced the dose from 10 to 5 mg. At skilled nursing, strongly advise tapering him off this medication.  Procedures: none   Consultations: cardiology  Discharge Exam: Vitals:   06/01/24 0747 06/01/24 0800  BP: 115/74 100/70  Pulse: 94 90  Resp: 18 18  Temp: (!) 97.4 F (36.3 C) 97.7 F (36.5 C)  SpO2: (!) 84% 100%    General: NAD Cardiovascular: RRR, soft systolic murmur Respiratory: CTAB  Discharge Instructions   Discharge Instructions     Increase activity slowly   Complete by: As directed       Allergies as of 06/01/2024   No Known Allergies      Medication List     STOP taking these medications    amiodarone  200 MG tablet Commonly known as: PACERONE    amLODipine  5 MG tablet Commonly known as: NORVASC   omeprazole 20 MG capsule Commonly known as: PRILOSEC   Potassium Chloride  ER 20 MEQ Tbcr       TAKE these medications    acetaminophen  500 MG tablet Commonly known as: TYLENOL  Take 1-2 tablets (500-1,000 mg total) by mouth every 6 (six) hours as needed for mild pain (pain score 1-3), fever or headache. What changed:  how much to take when to take this   albuterol  108  (90 Base) MCG/ACT inhaler Commonly known as: VENTOLIN  HFA Inhale 1-2 puffs into the lungs every 6 (six) hours as needed for wheezing or shortness of breath.   alum & mag hydroxide-simeth 200-200-20 MG/5ML suspension Commonly known as: MAALOX/MYLANTA Take 30 mLs by mouth every 4 (four) hours as needed for indigestion.   atorvastatin  40 MG tablet Commonly known as: LIPITOR TAKE 1 TABLET EVERY DAY   Blood Glucose Monitoring Suppl Devi 1 each by Does not apply route as directed. Dispense based on patient and insurance preference. Use up to four times daily as directed. (FOR ICD-10 E10.9, E11.9).   BLOOD GLUCOSE TEST STRIPS Strp 1 each by Does not apply route as directed. Dispense based on patient and insurance preference. Use up to four times daily as directed. (FOR ICD-10 E10.9, E11.9).   Blood Pressure Cuff Misc Check blood pressure as instructed by your physician   cyclobenzaprine  5 MG tablet Commonly known as: FLEXERIL  Take 1 tablet (5 mg total) by mouth 3 (three) times daily as needed for muscle spasms.   digoxin  0.125 MG tablet Commonly known as: LANOXIN  TAKE 1/2 TABLET BY MOUTH EVERY DAY   Eliquis  5 MG Tabs tablet Generic drug: apixaban  TAKE 1 TABLET TWICE DAILY   furosemide  40 MG tablet Commonly known as: LASIX  Take 1 tablet (40 mg total) by mouth daily.   Jardiance  10 MG Tabs tablet Generic drug: empagliflozin  TAKE 1 TABLET EVERY DAY   Lancet Device Misc 1 each by Does not apply route as directed. Dispense based on patient and insurance preference. Use up to four times daily as directed. (FOR ICD-10 E10.9, E11.9).   Lancets Misc 1 each by Does not apply route as directed. Dispense based on patient and insurance preference. Use up to four times daily as directed. (FOR ICD-10 E10.9, E11.9).   metFORMIN  500 MG tablet Commonly known as: GLUCOPHAGE  TAKE 1 TABLET TWICE DAILY WITH MEALS   methimazole  10 MG tablet Commonly known as: TAPAZOLE  Take 20 mg by mouth 2  (two) times daily.   metoprolol  succinate 50 MG 24 hr tablet Commonly known as: TOPROL -XL Take 1 tablet (50 mg total) by mouth 2 (two) times daily. Reduced from 150 mg. What changed: when to take this   nitroGLYCERIN  0.4 MG SL tablet Commonly known as: NITROSTAT  Place 0.4 mg under the tongue every 5 (five) minutes as needed.   Oxycodone  HCl 10 MG Tabs Take 1 tablet (10 mg total) by mouth every 4 (four) hours as needed for moderate pain (pain score 4-6), severe pain (pain score 7-10) or breakthrough pain.   pantoprazole  40 MG tablet Commonly known as: PROTONIX  Take 1 tablet (40 mg total) by mouth 2 (two) times daily.   polyethylene glycol 17 g packet Commonly known as: MIRALAX  / GLYCOLAX  Take 17 g by mouth daily as needed for mild constipation or moderate constipation (not helped with stool softner).   potassium chloride  SA 20 MEQ tablet Commonly known as: KLOR-CON  M TAKE 1 TABLET BY MOUTH EVERY DAY   Rybelsus  7 MG Tabs Generic drug: Semaglutide  Take 1  tablet (7 mg total) by mouth daily.   sacubitril -valsartan  49-51 MG Commonly known as: ENTRESTO  Take 1 tablet by mouth 2 (two) times daily.   senna-docusate 8.6-50 MG tablet Commonly known as: Senokot-S Take 1 tablet by mouth at bedtime.   spironolactone  25 MG tablet Commonly known as: ALDACTONE  Take 1 tablet (25 mg total) by mouth daily.   Stiolto Respimat  2.5-2.5 MCG/ACT Aers Generic drug: Tiotropium Bromide-Olodaterol INHALE 2 PUFFS BY MOUTH INTO THE LUNGS DAILY       Allergies[1]  Contact information for follow-up providers     [facility]. Go on 05/29/2024.   Specialty: Cardiology Why: Hospital Follow-Up 06/21/24 @ 2:30 [facility], [address], Second Floor Please bring all medications with you Free Valet Parking at the door Contact information: [address] [place]  [address] [phone]             Contact  information for after-discharge care     Destination     [facility] .   Service: Skilled Nursing Contact information: [address] [place]  [address] [phone]                      The results of significant diagnostics from this hospitalization (including imaging, microbiology, ancillary and laboratory) are listed below for reference.    Significant Diagnostic Studies: CT Angio Chest PE W/Cm &/Or Wo Cm Result Date: 05/28/2024 EXAM: CTA CHEST 05/28/2024 09:29:47 PM TECHNIQUE: CTA of the chest was performed after the administration of intravenous contrast. Multiplanar reformatted images are provided for review. MIP images are provided for review. Automated exposure control, iterative reconstruction, and/or weight based adjustment of the mA/kV was utilized to reduce the radiation dose to as low as reasonably achievable. COMPARISON: AP and lateral chest for today, CTA chest 05/04/2024, CTA chest 06/14/2022. CLINICAL HISTORY: Pulmonary embolism (PE) suspected, high prob. FINDINGS: PULMONARY ARTERIES: Pulmonary arteries are adequately opacified for evaluation. Enlarged pulmonary trunk measures 3.4 cm, consistent with arterial hypertension. No arterial embolism is seen to the segmental level, the subsegmental arterial bed is poorly evaluated due to respiratory motion. MEDIASTINUM: There is metal artifact from a single lead left sided cardiac assist device, the tip of the wire is in the right ventricle. There is moderate cardiomegaly with biatrial chamber predominance. No pericardial effusion. Scattered three-vessel coronary calcifications noted. There is aortic tortuosity, patchy atherosclerosis, and ascending aortic dilatation to 4.7 cm as before. Scattered calcifications in the great vessels. There is no dissection or stenosis. LYMPH NODES: There are mildly prominent mediastinal and bilateral hilar lymph nodes with some showing calcifications.  Examples include right paratracheal chain nodes up to 1.2 cm, pretracheal lymph nodes up to 1 cm with precarinal and subcarinal lymph nodes to 1.3 cm. Largest right hilar lymph node noted is 1.3 cm. Axillary spaces are clear. LUNGS AND PLEURA: There are mild centrilobular and paraseptal emphysema changes in the upper lobes. Diffuse bronchial thickening. There are posterior atelectatic changes in the lungs, but no consolidation, suspicious nodule or effusion. The trachea and central airways are clear. No pneumothorax. UPPER ABDOMEN: In the abdomen, there is a nodular liver contour compatible with cirrhosis but no splenomegaly or ascites. There is fatty infiltration in the pancreas. No acute upper abdominal findings. SOFT TISSUES AND BONES: Degenerative changes in the thoracic spine. No acute or significant osseous findings. Bilateral acromiohumeral abutment is chronically seen consistent with chronic rotator cuff arthropathy with likely degenerative  tears. IMPRESSION: 1. No evidence of pulmonary embolism to the segmental level; subsegmental arteries are poorly evaluated due to respiratory motion. 2. Enlarged pulmonary trunk measuring 3.4 cm, consistent with pulmonary arterial hypertension. Previously 3. Moderate cardiomegaly with biatrial chamber predominance, without pericardial effusion. 3-vessel coronary artery disease (cad). 4. Ascending aortic dilatation to 4.7 cm, without dissection or stenosis. Semiannual CTA or MRA follow-up and referral to cardiothoracic surgery recommended if not already done . 5. Mild centrilobular and paraseptal emphysema in the upper lobes; pulmonary emphysema is an independent risk factor for lung cancer, and consideration is recommended for evaluation for a low-dose CT lung cancer screening program. 6. No consolidation, suspicious nodule, or pleural effusion. Electronically signed by: Francis Quam MD 05/28/2024 10:04 PM EST RP Workstation: [id number]   DG Chest 2 View Result Date:  05/28/2024 CLINICAL DATA:  Shortness of breath EXAM: CHEST - 2 VIEW COMPARISON:  05/01/2024 FINDINGS: Left-sided single lead pacing device as before. Cardiomegaly without focal airspace disease, pleural effusion or pneumothorax. Minimal atelectasis or scarring at the lingula. IMPRESSION: No active cardiopulmonary disease. Cardiomegaly. Electronically Signed   By: Luke Bun M.D.   On: 05/28/2024 18:06   DG Shoulder Left Result Date: 05/08/2024 EXAM: 1 VIEW(S) XRAY OF THE LEFT SHOULDER 05/08/2024 05:41:00 PM COMPARISON: None available. CLINICAL HISTORY: Shoulder pain FINDINGS: BONES AND JOINTS: Superior subluxation of humeral head in relation to glenoid, compatible with chronic rotator cuff tear. Mild degenerative changes of acromioclavicular joint. No acute fracture or dislocation. SOFT TISSUES: Left chest cardiac device in place. No abnormal calcifications. Visualized lung is unremarkable. IMPRESSION: 1. Superior subluxation of the humeral head relative to the glenoid, compatible with a chronic rotator cuff tear. Electronically signed by: Dorethia Molt MD 05/08/2024 08:16 PM EST RP Workstation: [id number]   DG ELBOW COMPLETE LEFT (3+VIEW) Result Date: 05/08/2024 CLINICAL DATA:  Elbow pain. EXAM: LEFT ELBOW - COMPLETE 3+ VIEW COMPARISON:  None Available. FINDINGS: No appreciable acute fracture or dislocation. There is an elbow joint effusion. Faint periarticular mineralization at the radiocapitellar joint. Mild soft tissue swelling at the posterior elbow. IMPRESSION: 1. Elbow joint effusion without radiographically evident fracture or dislocation. In the setting of recent trauma, these findings are concerning for radiographically occult fracture. Consider CT of the elbow for further evaluation. 2. Faint periarticular mineralization at the radiocapitellar joint, could reflect chondrocalcinosis or dystrophic capsular/ligamentous calcification. Electronically Signed   By: Harrietta Sherry M.D.   On:  05/08/2024 13:14   DG Wrist Complete Left Result Date: 05/06/2024 CLINICAL DATA:  Left wrist pain EXAM: LEFT WRIST - COMPLETE 4 VIEW COMPARISON:  None Available. FINDINGS: There is no evidence of fracture or dislocation. Amorphous calcification along the radial carpal joint space. Severe degenerative changes of the thumb carpal metacarpal joint with large subchondral lucencies. Soft tissues are unremarkable. IMPRESSION: 1. No acute fracture or dislocation. 2. 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 3. Amorphous calcification along the radiocarpal joint space, which may represent chondrocalcinosis. Electronically Signed   By: Limin  Xu M.D.   On: 05/06/2024 19:03   CT Angio Chest Pulmonary Embolism (PE) W or WO Contrast Result Date: 05/04/2024 CLINICAL DATA:  Short of breath EXAM: CT ANGIOGRAPHY CHEST WITH CONTRAST TECHNIQUE: Multidetector CT imaging of the chest was performed using the standard protocol during bolus administration of intravenous contrast. Multiplanar CT image reconstructions and MIPs were obtained to evaluate the vascular anatomy. RADIATION DOSE REDUCTION: This exam was performed according to the departmental dose-optimization program which includes automated exposure control, adjustment of  the mA and/or kV according to patient size and/or use of iterative reconstruction technique. CONTRAST:  75mL OMNIPAQUE  IOHEXOL  350 MG/ML SOLN COMPARISON:  06/14/2022. Chest radiographs, most recent dated 05/01/2024. FINDINGS: Cardiovascular: Pulmonary arteries are well opacified. There is no evidence of a pulmonary embolism. Heart is mildly enlarged. Three-vessel coronary artery calcifications. No pericardial effusion. Ascending thoracic aorta is dilated to 4.7 cm. Main pulmonary artery is dilated to 4.3 cm. Mediastinum/Nodes: No neck base, mediastinal or hilar masses. Mildly prominent mediastinal and hilar lymph nodes, all subcentimeter and stable from the prior CT. Trachea  esophagus are unremarkable. Lungs/Pleura: Bronchial wall thickening most evident in the lower lobes. No lung consolidation. No evidence of edema. Mild centrilobular emphysema. No lung mass or suspicious nodule. No pleural effusion or pneumothorax. Upper Abdomen: No acute findings. Nodular contour of the liver. Aortic atherosclerosis with dilated aorta at its hiatus measuring 4.1 cm. Musculoskeletal: No fracture or acute finding.  No bone lesion. Review of the MIP images confirms the above findings. IMPRESSION: 1. No evidence of a pulmonary embolism. 2. No evidence of pneumonia or pulmonary edema. 3. Dilated aorta, ascending portion measuring 4.7 cm, stable from the prior CT. Ascending thoracic aortic aneurysm. Recommend semi-annual imaging followup by CTA or MRA and referral to cardiothoracic surgery if not already obtained. This recommendation follows 2010 ACCF/AHA/AATS/ACR/ASA/SCA/SCAI/SIR/STS/SVM Guidelines for the Diagnosis and Management of Patients With Thoracic Aortic Disease. Circulation. 2010; 121: Z733-z630. Aortic aneurysm NOS (ICD10-I71.9) 4. Dilated main pulmonary artery suggesting pulmonary artery hypertension, also stable from the prior CT. 5. Bronchial wall thickening and may be chronic. Consider acute bronchitis in the proper clinical setting. 6. Nodular liver contour suggesting cirrhosis. Aortic Atherosclerosis (ICD10-I70.0) and Emphysema (ICD10-J43.9). Electronically Signed   By: Alm Parkins M.D.   On: 05/04/2024 13:39    Microbiology: Recent Results (from the past 240 hours)  Resp panel by RT-PCR (RSV, Flu A&B, Covid) Anterior Nasal Swab     Status: None   Collection Time: 05/28/24  7:35 PM   Specimen: Anterior Nasal Swab  Result Value Ref Range Status   SARS Coronavirus 2 by RT PCR NEGATIVE NEGATIVE Final    Comment: (NOTE) SARS-CoV-2 target nucleic acids are NOT DETECTED.  The SARS-CoV-2 RNA is generally detectable in upper respiratory specimens during the acute phase of  infection. The lowest concentration of SARS-CoV-2 viral copies this assay can detect is 138 copies/mL. A negative result does not preclude SARS-Cov-2 infection and should not be used as the sole basis for treatment or other patient management decisions. A negative result may occur with  improper specimen collection/handling, submission of specimen other than nasopharyngeal swab, presence of viral mutation(s) within the areas targeted by this assay, and inadequate number of viral copies(<138 copies/mL). A negative result must be combined with clinical observations, patient history, and epidemiological information. The expected result is Negative.  Fact Sheet for Patients:  bloggercourse.com  Fact Sheet for Healthcare Providers:  seriousbroker.it  This test is no t yet approved or cleared by the [place]  FDA and  has been authorized for detection and/or diagnosis of SARS-CoV-2 by FDA under an Emergency Use Authorization (EUA). This EUA will remain  in effect (meaning this test can be used) for the duration of the COVID-19 declaration under Section 564(b)(1) of the Act, 21 U.S.C.section 360bbb-3(b)(1), unless the authorization is terminated  or revoked sooner.       Influenza A by PCR NEGATIVE NEGATIVE Final   Influenza B by PCR NEGATIVE NEGATIVE Final    Comment: (NOTE)  The Xpert Xpress SARS-CoV-2/FLU/RSV plus assay is intended as an aid in the diagnosis of influenza from Nasopharyngeal swab specimens and should not be used as a sole basis for treatment. Nasal washings and aspirates are unacceptable for Xpert Xpress SARS-CoV-2/FLU/RSV testing.  Fact Sheet for Patients: bloggercourse.com  Fact Sheet for Healthcare Providers: seriousbroker.it  This test is not yet approved or cleared by the [place]  FDA and has been authorized for detection and/or diagnosis of SARS-CoV-2  by FDA under an Emergency Use Authorization (EUA). This EUA will remain in effect (meaning this test can be used) for the duration of the COVID-19 declaration under Section 564(b)(1) of the Act, 21 U.S.C. section 360bbb-3(b)(1), unless the authorization is terminated or revoked.     Resp Syncytial Virus by PCR NEGATIVE NEGATIVE Final    Comment: (NOTE) Fact Sheet for Patients: bloggercourse.com  Fact Sheet for Healthcare Providers: seriousbroker.it  This test is not yet approved or cleared by the [place]  FDA and has been authorized for detection and/or diagnosis of SARS-CoV-2 by FDA under an Emergency Use Authorization (EUA). This EUA will remain in effect (meaning this test can be used) for the duration of the COVID-19 declaration under Section 564(b)(1) of the Act, 21 U.S.C. section 360bbb-3(b)(1), unless the authorization is terminated or revoked.  Performed at [facility], [address] [place], [place] [address]      Labs: Basic Metabolic Panel: Recent Labs  Lab 05/28/24 1727 05/28/24 1935 05/29/24 0703 05/30/24 0335 05/31/24 2243  NA 130*  --  133* 129*  --   K 4.2  --  3.2* 4.0  --   CL 92*  --  94* 92*  --   CO2 19*  --  26 23  --   GLUCOSE 152*  --  123* 120*  --   BUN 8  --  8 12  --   CREATININE 0.98  --  0.76 0.95  --   CALCIUM  9.3  --  8.9 8.7*  --   MG  --  1.7  --   --  2.4   Liver Function Tests: No results for input(s): AST, ALT, ALKPHOS, BILITOT, PROT, ALBUMIN in the last 168 hours. No results for input(s): LIPASE, AMYLASE in the last 168 hours. No results for input(s): AMMONIA in the last 168 hours. CBC: Recent Labs  Lab 05/28/24 1727 05/29/24 0703  WBC 8.0 5.5  HGB 12.9* 12.2*  HCT 40.1 37.1*  MCV 83.2 83.2  PLT 210 182   Cardiac Enzymes: No results for input(s): CKTOTAL, CKMB, CKMBINDEX, TROPONINI in the last 168 hours. BNP: BNP (last 3  results) Recent Labs    03/06/24 0951  BNP 37.6    ProBNP (last 3 results) Recent Labs    05/01/24 1257 05/28/24 1727  PROBNP 5,875.0* 1,026.0*    CBG: Recent Labs  Lab 05/31/24 0803 05/31/24 1213 05/31/24 1637 05/31/24 2123 06/01/24 0736  GLUCAP 122* 116* 137* 120* 118*       Signed:  Devaughn KATHEE Ban MD.  [facility] 06/01/2024, 9:23 AM     [1] No Known Allergies

## 2024-06-05 ENCOUNTER — Ambulatory Visit: Attending: Medical | Admitting: Medical

## 2024-06-05 NOTE — Progress Notes (Deleted)
" °  Cardiology Office Note   Date:  06/05/2024  ID:  Eugene Henderson, DOB Jul 31, 1950, MRN [id number] PCP: Louder Duwaine SQUIBB, DO  [facility] Providers Cardiologist:  Deatrice Cage, MD Electrophysiologist:  OLE ONEIDA HOLTS, MD { Click to update primary MD,subspecialty MD or APP then REFRESH:1}    History of Present Illness Eugene Henderson is a 73 y.o. male with a h/o NICM based on cath in 2018, HTN, DM2, HFrEF s/p ICD, permanent Afib, tobacco use and alcohol use who presents for 74-month follow-up of CHF.   He was admitted 06/14/22 with SOB and chest pain. He was noted to be in afib RVR and underwent DCCV to NSR. 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 Echo 06/2022 showed LVEF 20-25% with severe RV dysfunction. Cardiac MRI 2/24 showed LVEF 17%, RVEF 36%, small area of lateral wall subendocardial LGE, mid-wall LGE at this inferoseptal RV insertion site. There was a very small area of possible prior MI, but this study suggested primarily NICM.    RHC 09/2022 showed normal filling pressures with mildly decreased CI at 2.2. he failed DCCV on amiodarone  09/2022. Biotronik ICD was placed 10/2022.    He has been following with heart failure and EP. He saw Dr. Holts 08/2023 reporting chest pain. PET stress was ordered which was abnormal partially reversible location. EF 30-35%. He was asked to come in to discuss heart cath. R/L heart cath 12/16/23 showed minor irregularities with no evidence of CAD. RHC showed moderately elevated right atrial pressure, moderate pulmonary HTN and normal CO.  Patient was admitted to the hospital late 04/2024 with COPD exacerbation with admission complicated by underlying cardiomyopathy and permanent A-fib with RVR felt to be secondary to noncompliance with pharmacotherapy.  Echo in 04/2024 showed EF of 40 to 45%, mildly dilated LV internal cavity  size, mild LVH, mildly reduced RV SF with mildly large ventricle cavity size, severe biatrial enlargement, degenerative mitral valve with moderate MR, moderate TR, tricuspid aortic valve with mild insufficiency, moderate pulm monic regurgitation, moderate dilation of the aortic root measuring 48 mm and ascending thoracic order measuring 45 mm.  He returned to [facility] 05/28/2024 in the setting of severe worsening left elbow and wrist pain leading to shortness of breath which he attributed to progressive severe worsening pain  ROS: ***  Studies Reviewed      *** Risk Assessment/Calculations {Does this patient have ATRIAL FIBRILLATION?:[phone]} No BP recorded.  {Refresh Note OR Click here to enter BP  :1}***       Physical Exam VS:  There were no vitals taken for this visit.       Wt Readings from Last 3 Encounters:  06/01/24 255 lb 4.7 oz (115.8 kg)  05/10/24 272 lb 7.8 oz (123.6 kg)  04/16/24 275 lb (124.7 kg)    GEN: Well nourished, well developed in no acute distress NECK: No JVD; No carotid bruits CARDIAC: ***RRR, no murmurs, rubs, gallops RESPIRATORY:  Clear to auscultation without rales, wheezing or rhonchi  ABDOMEN: Soft, non-tender, non-distended EXTREMITIES:  No edema; No deformity   ASSESSMENT AND PLAN ***    {Are you ordering a CV Procedure (e.g. stress test, cath, DCCV, TEE, etc)?   Press F2        :[id number]}  Dispo: ***  Signed, Marla Pouliot VEAR Fishman, PA-C   "

## 2024-06-08 ENCOUNTER — Other Ambulatory Visit: Payer: Self-pay | Admitting: Family Medicine

## 2024-06-12 ENCOUNTER — Ambulatory Visit: Admitting: Podiatry

## 2024-06-14 ENCOUNTER — Ambulatory Visit (INDEPENDENT_AMBULATORY_CARE_PROVIDER_SITE_OTHER): Admitting: Student in an Organized Health Care Education/Training Program

## 2024-06-14 VITALS — BP 130/78 | HR 101 | Temp 98.5°F | Ht 72.0 in | Wt 260.0 lb

## 2024-06-14 DIAGNOSIS — Z87891 Personal history of nicotine dependence: Secondary | ICD-10-CM | POA: Diagnosis not present

## 2024-06-14 DIAGNOSIS — J432 Centrilobular emphysema: Secondary | ICD-10-CM | POA: Diagnosis not present

## 2024-06-14 MED ORDER — ALBUTEROL SULFATE HFA 108 (90 BASE) MCG/ACT IN AERS: 1.0000 | INHALATION_SPRAY | Freq: Four times a day (QID) | RESPIRATORY_TRACT | 6 refills | Status: AC | PRN

## 2024-06-14 MED ORDER — STIOLTO RESPIMAT 2.5-2.5 MCG/ACT IN AERS: 2.0000 | INHALATION_SPRAY | Freq: Every day | RESPIRATORY_TRACT | 12 refills | Status: AC

## 2024-06-14 NOTE — Patient Instructions (Signed)
" °  VISIT SUMMARY: You came in today for a follow-up after recent hospitalizations for atrial fibrillation with rapid ventricular response and a COPD exacerbation. You have a history of COPD and chronic heart failure. You mentioned feeling better after your second discharge compared to the first. We discussed your current symptoms, including episodes of severe shortness of breath, and your concerns about nutrition and depression.  YOUR PLAN: -CENTRILOBULAR EMPHYSEMA: Centrilobular emphysema is a type of chronic obstructive pulmonary disease (COPD) that affects the small airways in the lungs. Your recent exacerbation has resolved, and your breathing has improved. You should continue using the tiotropium bromide-olodaterol inhaler, taking two puffs in the morning, and use the albuterol  inhaler as needed, primarily in the evening. Supplemental oxygen is discontinued unless you experience shortness of breath. Your prescriptions for inhalers have been sent to [facility] on [address].  INSTRUCTIONS: Please follow up with us  if you experience any worsening of symptoms or have any concerns. Continue with your current inhaler regimen and monitor your symptoms closely.     Contains text generated by [employer].  "

## 2024-06-14 NOTE — Progress Notes (Signed)
 " Assessment & Plan  #Centrilobular emphysema  Presents for follow up of COPD, in the setting of known HFrEF, Afib, and Pulmonary Hypertension (Group II). He was recently hospitalized and treated for a COPD exacerbation in November of 2025. He was also noted to have afib with RVR during both his admissions in November and December of 2025.    He does have emphysema noted on chest CT, and PFT's from February of 2024 are consistent with COPD (FEV1/FVC 0.46, FEV1 35% predicted/z score -5).   He has severe COPD based on these findings and is on LABA/LAMA therapy. Would hold off on adding ICS at this point, but we could consider ensifentrine  in the future if his symptoms worsen. He does not have documented eosinophilia   Patient was discharged home on oxygen following his last admission. Oxygenation on room air today was within normal at 98%, and he trended on room air without hypoxia, and remained at 98%. Patient can safely discontinue the use of his oxygen via nasal cannula.   Finally, patient is followed closely with cardiology for his HFrEF and atrial fibrillation. He has had RHC's that show elevated filling pressures. He is maintained on Eliquis  for thrombo-prophylaxis.   - Continue tiotropium bromide-olodaterol inhaler, two puffs in the morning. - Use albuterol  inhaler as needed, primarily in the evening. - albuterol  (VENTOLIN  HFA) 108 (90 Base) MCG/ACT inhaler; Inhale 1-2 puffs into the lungs every 6 (six) hours as needed for wheezing or shortness of breath.  Dispense: 18 each; Refill: 6 - Tiotropium Bromide-Olodaterol (STIOLTO RESPIMAT ) 2.5-2.5 MCG/ACT AERS; Inhale 2 puffs into the lungs daily.  Dispense: 4 g; Refill: 12   Return in about 6 months (around 12/12/2024).  Belva November, MD  Pulmonary Critical Care  I spent 35 minutes caring for this patient today, including preparing to see the patient, obtaining a medical history , reviewing a separately obtained history, performing a  medically appropriate examination and/or evaluation, counseling and educating the patient/family/caregiver, ordering medications, tests, or procedures, documenting clinical information in the electronic health record, and independently interpreting results (not separately reported/billed) and communicating results to the patient/family/caregiver  End of visit medications:  Meds ordered this encounter  Medications   albuterol  (VENTOLIN  HFA) 108 (90 Base) MCG/ACT inhaler    Sig: Inhale 1-2 puffs into the lungs every 6 (six) hours as needed for wheezing or shortness of breath.    Dispense:  18 each    Refill:  6   Tiotropium Bromide-Olodaterol (STIOLTO RESPIMAT ) 2.5-2.5 MCG/ACT AERS    Sig: Inhale 2 puffs into the lungs daily.    Dispense:  4 g    Refill:  12    Patient will need an appointment before we can send anymore refills.    Current Medications[1]   Subjective:   PATIENT ID: Eugene Henderson GENDER: male DOB: [birth year], MRN: [id number]  Chief Complaint  Patient presents with   Emphysema    Hospital Follow up from 05/28/2024. DOE. No wheezing. Cough, dry. Stiolto- 1 puff every am. Albuterol - PRN    HPI  Discussed the use of AI scribe software for clinical note transcription with the patient, who gave verbal consent to proceed.  History of Present Illness Dreyden Rohrman is a 74 year old male with COPD and chronic heart failure who presents for follow-up after recent hospitalizations for AFib with RVR and COPD exacerbation.  Return Visit 12/29/2022:  Patient has been followed closely in the heart failure clinic by Dr. Rolan. He has underwent an investigation for  his HFrEF and is referred to us  to assess for COPD given his smoking history as well has his PFT results.   Interval history notes improvement in shortness of breath with Stiolto. Patient is compliant with it. He still feels some shortness of breath when he lays flat, but otherwise doesn't report exertional dyspnea,  cough, or wheezing. No sputum production is reported.   Patient had been followed by cardiology for a long time for HFrEF and was recently established with our heart failure team. He was admitted in January of 2024 to the hospital for chest pain and shortness of breath at which point he was found to be in Afib with RVR. He underwent DC cardioversion during said admission. He's unfortunately remained in atrial fibrillation and rhythm control was abandoned. Patient underwent ICD placement in May. He continues to follow up closely with advanced heart failure.  He underwent repeat RHC on 09/21/2022 with the following findings: RA 11, RV 38/11, PA 40/16 (29), PCWP 13, CO 5.26, CI 2.22, PVR 3 WU, PAPi 2.2.  Return Visit 06/14/2024:  He has a history of COPD and chronic heart failure, with recent hospitalizations in November and December for atrial fibrillation with rapid ventricular response and a COPD exacerbation (November). During these hospitalizations, he was treated with antibiotics, steroids, and rate control medications. He feels better after the second discharge compared to the first, when he felt 'in a wreck'. He was discharged home on oxygen, but has been using it sporadically.  He attributes some of his health issues to 'bad habits' and poor nutrition, noting a lack of appetite and sometimes only consuming a bowl of cereal or a banana. He occasionally eats TV dinners and ramen noodles, which he acknowledges are high in salt, affecting his sodium levels.  He is currently using Stiolto Respimat , taking two puffs in the morning, which helps him breathe better. He also has his albuterol  as a rescue inhaler, which he uses primarily for emergencies, approximately once in the evening. He was initially taking one puff of Stiolto before hospitalization but has since increased to two puffs.  He feels depressed due to his inability to drive and vision problems, which affects his daily life and contributes to  his poor eating habits. His daughter encourages him to eat dinner, but he sometimes struggles to do so. He reports improved breathing with the current inhaler regimen.  RHC 12/16/2023: RA 14, RV 49/11, PA 51/23 (35), PAOP 15, CO/CI 6.24/2.51    Ancillary information including prior medications, full medical/surgical/family/social histories, and PFTs (when available) are listed below and have been reviewed.    Review of Systems  Constitutional:  Positive for malaise/fatigue. Negative for chills, fever and weight loss.  Respiratory:  Positive for shortness of breath. Negative for cough, hemoptysis, sputum production and wheezing.   Cardiovascular:  Negative for chest pain.     Objective:   Vitals:   06/14/24 1406  BP: 130/78  Pulse: (!) 101  Temp: 98.5 F (36.9 C)  SpO2: 98%  Weight: 260 lb (117.9 kg)  Height: 6' (1.829 m)   98% on RA, trended on room air, remained at 98%.  BMI Readings from Last 3 Encounters:  06/14/24 35.26 kg/m  06/01/24 34.62 kg/m  05/10/24 36.96 kg/m   Wt Readings from Last 3 Encounters:  06/14/24 260 lb (117.9 kg)  06/01/24 255 lb 4.7 oz (115.8 kg)  05/10/24 272 lb 7.8 oz (123.6 kg)     Physical Exam Constitutional:      Appearance:  Normal appearance. He is obese.  Cardiovascular:     Rate and Rhythm: Normal rate. Rhythm irregular.     Pulses: Normal pulses.     Heart sounds: Normal heart sounds.  Pulmonary:     Effort: Pulmonary effort is normal.     Breath sounds: Normal breath sounds. No wheezing.  Neurological:     General: No focal deficit present.     Mental Status: He is alert and oriented to person, place, and time. Mental status is at baseline.       Ancillary Information    Past Medical History:  Diagnosis Date   Arthritis    Ascending aortic aneurysm    a. 09/2017 Stable TAA - 5.1cm.   Asthma    Chronic systolic CHF (congestive heart failure) (HCC)    a. EF 25-30% by echo in 07/2016 with cath showing no significant  CAD b. 01/2017: EF 30-35% with diffuse HK and moderate MR; c. 07/2017 Echo: EF 30-35%, diff hK. Mild MR, mildly dil LA. PASP .   Diverticulitis    Diverticulitis of large intestine with perforation without abscess or bleeding 05/13/2017   Hypertension    Hyperthyroidism    NICM (nonischemic cardiomyopathy) (HCC)    Noncompliance    Persistent atrial fibrillation (HCC)    a. CHA2DS2VASc = 3-->Eliquis  (? compliance).     Family History  Problem Relation Age of Onset   Diabetes Mother    Diabetes Father    Heart disease Brother    Heart attack Brother    Diabetes Brother    Kidney failure Brother    Thyroid  disease Neg Hx      Past Surgical History:  Procedure Laterality Date   CARDIOVERSION N/A 06/23/2022   Procedure: CARDIOVERSION;  Surgeon: Gardenia Led, DO;  Location: [facility] ORS;  Service: Cardiovascular;  Laterality: N/A;   CARDIOVERSION N/A 06/25/2022   Procedure: CARDIOVERSION;  Surgeon: Gardenia Led, DO;  Location: [facility] ORS;  Service: Cardiovascular;  Laterality: N/A;   CARDIOVERSION N/A 09/29/2022   Procedure: CARDIOVERSION;  Surgeon: Rolan Ezra RAMAN, MD;  Location: [facility] INVASIVE CV LAB;  Service: Cardiovascular;  Laterality: N/A;   COLONOSCOPY N/A 08/27/2019   Procedure: COLONOSCOPY;  Surgeon: Toledo, Ladell POUR, MD;  Location: [facility] ENDOSCOPY;  Service: Gastroenterology;  Laterality: N/A;   ENDOVASCULAR STENT GRAFT (AAA) N/A 06/17/2022   Procedure: ENDOVASCULAR REPAIR/STENT GRAFT;  Surgeon: Marea Selinda RAMAN, MD;  Location: [facility] INVASIVE CV LAB;  Service: Cardiovascular;  Laterality: N/A;   ICD IMPLANT N/A 11/03/2022   Procedure: ICD IMPLANT;  Surgeon: Cindie Ole DASEN, MD;  Location: [facility] INVASIVE CV LAB;  Service: Cardiovascular;  Laterality: N/A;   JOINT REPLACEMENT Right    RIGHT HEART CATH Right 09/21/2022   Procedure: RIGHT HEART CATH;  Surgeon: Rolan Ezra RAMAN, MD;  Location: [facility] INVASIVE CV LAB;  Service: Cardiovascular;  Laterality: Right;   RIGHT/LEFT HEART  CATH AND CORONARY ANGIOGRAPHY N/A 08/02/2016   Procedure: Right/Left Heart Cath and Coronary Angiography;  Surgeon: Deatrice DELENA Cage, MD;  Location: [facility] INVASIVE CV LAB;  Service: Cardiovascular;  Laterality: N/A;   RIGHT/LEFT HEART CATH AND CORONARY ANGIOGRAPHY Bilateral 12/16/2023   Procedure: RIGHT/LEFT HEART CATH AND CORONARY ANGIOGRAPHY;  Surgeon: Cage Deatrice DELENA, MD;  Location: [facility] INVASIVE CV LAB;  Service: Cardiovascular;  Laterality: Bilateral;   TEE WITHOUT CARDIOVERSION N/A 06/23/2022   Procedure: TRANSESOPHAGEAL ECHOCARDIOGRAM;  Surgeon: Gardenia Led, DO;  Location: [facility] ORS;  Service: Cardiovascular;  Laterality: N/A;   TESTICLE SURGERY     Patient  states that he had to have the tube fixed.    Social History   Socioeconomic History   Marital status: Single    Spouse name: Not on file   Number of children: Not on file   Years of education: Not on file   Highest education level: Not on file  Occupational History   Occupation: retired  Tobacco Use   Smoking status: Former    Current packs/day: 0.00    Types: Pipe, Cigarettes    Quit date: 12/27/2022    Years since quitting: 1.4   Smokeless tobacco: Never  Vaping Use   Vaping status: Never Used  Substance and Sexual Activity   Alcohol use: Yes    Alcohol/week: 2.0 standard drinks of alcohol    Types: 2 Cans of beer per week    Comment: 2 40oz beers/week   Drug use: No   Sexual activity: Not Currently  Other Topics Concern   Not on file  Social History Narrative   Not on file   Social Drivers of Health   Tobacco Use: High Risk (06/14/2024)   Received from [facility]   Patient History    Smoking Tobacco Use: Every Day    Smokeless Tobacco Use: Never    Passive Exposure: Not on file  Financial Resource Strain: Low Risk  (06/14/2024)   Received from [facility]   Overall Financial Resource Strain (CARDIA)    Difficulty of Paying Living Expenses: Not hard at all  Food  Insecurity: No Food Insecurity (06/14/2024)   Received from [facility]   Epic    Within the past 12 months, you worried that your food would run out before you got the money to buy more.: Never true    Within the past 12 months, the food you bought just didn't last and you didn't have money to get more.: Never true  Transportation Needs: No Transportation Needs (06/14/2024)   Received from [facility] - Transportation    In the past 12 months, has lack of transportation kept you from medical appointments or from getting medications?: No    Lack of Transportation (Non-Medical): No  Recent Concern: Transportation Needs - Unmet Transportation Needs (03/27/2024)   Epic    Lack of Transportation (Medical): Yes    Lack of Transportation (Non-Medical): Yes  Physical Activity: Inactive (07/27/2022)   Exercise Vital Sign    Days of Exercise per Week: 0 days    Minutes of Exercise per Session: 0 min  Stress: No Stress Concern Present (07/27/2022)   [employer] - Occupational Stress Questionnaire    Feeling of Stress : Not at all  Social Connections: Socially Isolated (05/30/2024)   Social Connection and Isolation Panel    Frequency of Communication with Friends and Family: More than three times a week    Frequency of Social Gatherings with Friends and Family: More than three times a week    Attends Religious Services: Never    Database Administrator or Organizations: No    Attends Banker Meetings: Never    Marital Status: Divorced  Catering Manager Violence: Not At Risk (05/30/2024)   Epic    Fear of Current or Ex-Partner: No    Emotionally Abused: No    Physically Abused: No    Sexually Abused: No  Depression (PHQ2-9): Medium Risk (04/16/2024)   Depression (PHQ2-9)    PHQ-2 Score: 6  Alcohol Screen: Low  Risk (07/27/2022)   Alcohol Screen    Last Alcohol Screening Score (AUDIT): 2  Housing: Unknown  (06/14/2024)   Received from [facility]   Epic    In the last 12 months, was there a time when you were not able to pay the mortgage or rent on time?: No    Number of Times Moved in the Last Year: Not on file    At any time in the past 12 months, were you homeless or living in a shelter (including now)?: No  Utilities: Not At Risk (06/14/2024)   Received from [facility]   Epic    In the past 12 months has the electric, gas, oil, or water company threatened to shut off services in your home?: No  Health Literacy: Not on file     Allergies[2]   CBC    Component Value Date/Time   WBC 5.5 05/29/2024 0703   RBC 4.46 05/29/2024 0703   HGB 12.2 (L) 05/29/2024 0703   HGB 14.5 03/06/2024 0951   HCT 37.1 (L) 05/29/2024 0703   HCT 43.1 03/06/2024 0951   PLT 182 05/29/2024 0703   PLT 207 03/06/2024 0951   MCV 83.2 05/29/2024 0703   MCV 86 03/06/2024 0951   MCH 27.4 05/29/2024 0703   MCHC 32.9 05/29/2024 0703   RDW 15.9 (H) 05/29/2024 0703   RDW 16.0 (H) 03/06/2024 0951   LYMPHSABS 1.2 02/09/2024 0935   MONOABS 0.7 05/14/2017 0351   EOSABS 0.2 02/09/2024 0935   BASOSABS 0.0 02/09/2024 0935    Pulmonary Functions Testing Results:    Latest Ref Rng & Units 07/22/2022    2:54 PM  PFT Results  FVC-Pre L 2.06   FVC-Predicted Pre % 43   FVC-Post L 2.47   FVC-Predicted Post % 52   Pre FEV1/FVC % % 46   Post FEV1/FCV % % 51   FEV1-Pre L 0.94   FEV1-Predicted Pre % 27   FEV1-Post L 1.25   DLCO uncorrected ml/min/mmHg 15.38   DLCO UNC% % 56   DLVA Predicted % 74   TLC L 7.05   TLC % Predicted % 94   RV % Predicted % 154     Outpatient Medications Prior to Visit  Medication Sig Dispense Refill   acetaminophen  (TYLENOL ) 500 MG tablet Take 1-2 tablets (500-1,000 mg total) by mouth every 6 (six) hours as needed for mild pain (pain score 1-3), fever or headache. (Patient taking differently: Take 1,000 mg by mouth 2 (two) times daily.) 800 tablet 4    alum & mag hydroxide-simeth (MAALOX/MYLANTA) 200-200-20 MG/5ML suspension Take 30 mLs by mouth every 4 (four) hours as needed for indigestion.     atorvastatin  (LIPITOR) 40 MG tablet TAKE 1 TABLET EVERY DAY 90 tablet 1   Blood Glucose Monitoring Suppl DEVI 1 each by Does not apply route as directed. Dispense based on patient and insurance preference. Use up to four times daily as directed. (FOR ICD-10 E10.9, E11.9). 1 each 0   Blood Pressure Monitoring (BLOOD PRESSURE CUFF) MISC Check blood pressure as instructed by your physician 1 each 0   cyclobenzaprine  (FLEXERIL ) 5 MG tablet Take 1 tablet (5 mg total) by mouth 3 (three) times daily as needed for muscle spasms.     digoxin  (LANOXIN ) 0.125 MG tablet TAKE 1/2 TABLET BY MOUTH EVERY DAY 90 tablet 2   ELIQUIS  5 MG TABS tablet TAKE 1 TABLET TWICE DAILY 180 tablet 3   empagliflozin  (JARDIANCE ) 10  MG TABS tablet TAKE 1 TABLET EVERY DAY 90 tablet 1   furosemide  (LASIX ) 40 MG tablet Take 1 tablet (40 mg total) by mouth daily. 90 tablet 3   Glucose Blood (BLOOD GLUCOSE TEST STRIPS) STRP 1 each by Does not apply route as directed. Dispense based on patient and insurance preference. Use up to four times daily as directed. (FOR ICD-10 E10.9, E11.9). 100 strip 0   Lancet Device MISC 1 each by Does not apply route as directed. Dispense based on patient and insurance preference. Use up to four times daily as directed. (FOR ICD-10 E10.9, E11.9). 1 each 0   Lancets MISC 1 each by Does not apply route as directed. Dispense based on patient and insurance preference. Use up to four times daily as directed. (FOR ICD-10 E10.9, E11.9). 100 each 0   metFORMIN  (GLUCOPHAGE ) 500 MG tablet TAKE 1 TABLET TWICE DAILY WITH MEALS 180 tablet 1   methimazole  (TAPAZOLE ) 10 MG tablet Take 20 mg by mouth 2 (two) times daily.     metoprolol  succinate (TOPROL -XL) 50 MG 24 hr tablet Take 1 tablet (50 mg total) by mouth 2 (two) times daily. Reduced from 150 mg.     nitroGLYCERIN   (NITROSTAT ) 0.4 MG SL tablet Place 0.4 mg under the tongue every 5 (five) minutes as needed.     oxyCODONE  (OXY IR/ROXICODONE ) 5 MG immediate release tablet Take 1 tablet (5 mg total) by mouth every 6 (six) hours as needed (Moderate pain). 15 tablet 0   pantoprazole  (PROTONIX ) 40 MG tablet Take 1 tablet (40 mg total) by mouth 2 (two) times daily. 180 tablet 1   polyethylene glycol (MIRALAX  / GLYCOLAX ) 17 g packet Take 17 g by mouth daily as needed for mild constipation or moderate constipation (not helped with stool softner).     potassium chloride  SA (KLOR-CON  M) 20 MEQ tablet TAKE 1 TABLET BY MOUTH EVERY DAY 30 tablet 0   sacubitril -valsartan  (ENTRESTO ) 49-51 MG Take 1 tablet by mouth 2 (two) times daily. 180 tablet 3   Semaglutide  (RYBELSUS ) 7 MG TABS Take 1 tablet (7 mg total) by mouth daily. 90 tablet 0   senna-docusate (SENOKOT-S) 8.6-50 MG tablet Take 1 tablet by mouth at bedtime.     spironolactone  (ALDACTONE ) 25 MG tablet Take 1 tablet (25 mg total) by mouth daily. 90 tablet 1   albuterol  (VENTOLIN  HFA) 108 (90 Base) MCG/ACT inhaler Inhale 1-2 puffs into the lungs every 6 (six) hours as needed for wheezing or shortness of breath. 18 each 6   Tiotropium Bromide-Olodaterol (STIOLTO RESPIMAT ) 2.5-2.5 MCG/ACT AERS INHALE 2 PUFFS BY MOUTH INTO THE LUNGS DAILY 4 g 0   No facility-administered medications prior to visit.      [1]  Current Outpatient Medications:    acetaminophen  (TYLENOL ) 500 MG tablet, Take 1-2 tablets (500-1,000 mg total) by mouth every 6 (six) hours as needed for mild pain (pain score 1-3), fever or headache. (Patient taking differently: Take 1,000 mg by mouth 2 (two) times daily.), Disp: 800 tablet, Rfl: 4   albuterol  (VENTOLIN  HFA) 108 (90 Base) MCG/ACT inhaler, Inhale 1-2 puffs into the lungs every 6 (six) hours as needed for wheezing or shortness of breath., Disp: 18 each, Rfl: 6   alum & mag hydroxide-simeth (MAALOX/MYLANTA) 200-200-20 MG/5ML suspension, Take 30 mLs by  mouth every 4 (four) hours as needed for indigestion., Disp: , Rfl:    atorvastatin  (LIPITOR) 40 MG tablet, TAKE 1 TABLET EVERY DAY, Disp: 90 tablet, Rfl: 1   Blood  Glucose Monitoring Suppl DEVI, 1 each by Does not apply route as directed. Dispense based on patient and insurance preference. Use up to four times daily as directed. (FOR ICD-10 E10.9, E11.9)., Disp: 1 each, Rfl: 0   Blood Pressure Monitoring (BLOOD PRESSURE CUFF) MISC, Check blood pressure as instructed by your physician, Disp: 1 each, Rfl: 0   cyclobenzaprine  (FLEXERIL ) 5 MG tablet, Take 1 tablet (5 mg total) by mouth 3 (three) times daily as needed for muscle spasms., Disp: , Rfl:    digoxin  (LANOXIN ) 0.125 MG tablet, TAKE 1/2 TABLET BY MOUTH EVERY DAY, Disp: 90 tablet, Rfl: 2   ELIQUIS  5 MG TABS tablet, TAKE 1 TABLET TWICE DAILY, Disp: 180 tablet, Rfl: 3   empagliflozin  (JARDIANCE ) 10 MG TABS tablet, TAKE 1 TABLET EVERY DAY, Disp: 90 tablet, Rfl: 1   furosemide  (LASIX ) 40 MG tablet, Take 1 tablet (40 mg total) by mouth daily., Disp: 90 tablet, Rfl: 3   Glucose Blood (BLOOD GLUCOSE TEST STRIPS) STRP, 1 each by Does not apply route as directed. Dispense based on patient and insurance preference. Use up to four times daily as directed. (FOR ICD-10 E10.9, E11.9)., Disp: 100 strip, Rfl: 0   Lancet Device MISC, 1 each by Does not apply route as directed. Dispense based on patient and insurance preference. Use up to four times daily as directed. (FOR ICD-10 E10.9, E11.9)., Disp: 1 each, Rfl: 0   Lancets MISC, 1 each by Does not apply route as directed. Dispense based on patient and insurance preference. Use up to four times daily as directed. (FOR ICD-10 E10.9, E11.9)., Disp: 100 each, Rfl: 0   metFORMIN  (GLUCOPHAGE ) 500 MG tablet, TAKE 1 TABLET TWICE DAILY WITH MEALS, Disp: 180 tablet, Rfl: 1   methimazole  (TAPAZOLE ) 10 MG tablet, Take 20 mg by mouth 2 (two) times daily., Disp: , Rfl:    metoprolol  succinate (TOPROL -XL) 50 MG 24 hr tablet,  Take 1 tablet (50 mg total) by mouth 2 (two) times daily. Reduced from 150 mg., Disp: , Rfl:    nitroGLYCERIN  (NITROSTAT ) 0.4 MG SL tablet, Place 0.4 mg under the tongue every 5 (five) minutes as needed., Disp: , Rfl:    oxyCODONE  (OXY IR/ROXICODONE ) 5 MG immediate release tablet, Take 1 tablet (5 mg total) by mouth every 6 (six) hours as needed (Moderate pain)., Disp: 15 tablet, Rfl: 0   pantoprazole  (PROTONIX ) 40 MG tablet, Take 1 tablet (40 mg total) by mouth 2 (two) times daily., Disp: 180 tablet, Rfl: 1   polyethylene glycol (MIRALAX  / GLYCOLAX ) 17 g packet, Take 17 g by mouth daily as needed for mild constipation or moderate constipation (not helped with stool softner)., Disp: , Rfl:    potassium chloride  SA (KLOR-CON  M) 20 MEQ tablet, TAKE 1 TABLET BY MOUTH EVERY DAY, Disp: 30 tablet, Rfl: 0   sacubitril -valsartan  (ENTRESTO ) 49-51 MG, Take 1 tablet by mouth 2 (two) times daily., Disp: 180 tablet, Rfl: 3   Semaglutide  (RYBELSUS ) 7 MG TABS, Take 1 tablet (7 mg total) by mouth daily., Disp: 90 tablet, Rfl: 0   senna-docusate (SENOKOT-S) 8.6-50 MG tablet, Take 1 tablet by mouth at bedtime., Disp: , Rfl:    spironolactone  (ALDACTONE ) 25 MG tablet, Take 1 tablet (25 mg total) by mouth daily., Disp: 90 tablet, Rfl: 1   Tiotropium Bromide-Olodaterol (STIOLTO RESPIMAT ) 2.5-2.5 MCG/ACT AERS, Inhale 2 puffs into the lungs daily., Disp: 4 g, Rfl: 12 [2] No Known Allergies  "

## 2024-06-14 NOTE — Progress Notes (Signed)
 [facility] Chief Complaint:   Chief Complaint  Patient presents with   Left Shoulder - Pain, Follow-up, New Patient    ER f/u: Lt rotator cuff tear, nondisplaced radial head fracture     History of Present Illness:    Eugene Henderson is a 74 y.o. male that presents to clinic today for follow up evaluation and management of left arm pain with rotator cuff arthropathy to the shoulder nondisplaced left radial head fracture.  They were last evaluated by myself on 05/24/2024.  At that time, the plan was to come out of his sling for range of motion exercise, occupational therapy, over-the-counter medicine, then follow-up for reevaluation.  He has been compliant with this plan.  He comes in today for routine reevaluation.  There is no new data to be reviewed.  His most recent available labs from 05/10/2024 show creatinine 0.83, decreased sodium and chloride, decreased hemoglobin. He had left wrist x-ray at the hospital on 05/06/2024 which reported severe degenerative changes to the thumb CMC joint, calcification of the radiocarpal joint suspected represent chondrocalcinosis. He had left elbow x-ray on 05/08/2024 that showed mild soft tissue swelling of the posterior elbow and joint effusion which could represent radiographically occult fracture. He had left shoulder x-ray on 05/08/2024 that reported chronic rotator cuff tear arthropathy change.   Today, the patient reports their symptoms are improved but still persisting.  He notices left shoulder pain that is worse with lifting overhead.  The pain is along his lateral shoulder.  His elbow is not painful at rest but hurts to do full extension.  He currently rates pain severity as a 8/10.  He denies associated swelling, locking/catching, instability, numbness or tingling, weakness, fevers or chills, night sweats, weight loss, skin color change, pain at night.  He has been using over-the-counter medications,  acetaminophen , topical pain cream, home exercise for his symptoms.  He is right hand dominant and retired as a estate agent.   He feels like his symptoms are tolerable but he does make concessions to activities to avoid pain.  Medications, Past Medical/Surgical/Family/Social History:   Current Outpatient Medications  Medication Sig Dispense Refill   acetaminophen  (TYLENOL ) 500 MG tablet Take 500 mg by mouth every 6 (six) hours as needed for Pain     albuterol  MDI, PROVENTIL , VENTOLIN , PROAIR , HFA 90 mcg/actuation inhaler Inhale 1-2 inhalations into the lungs every 6 (six) hours as needed for Wheezing or Shortness of Breath     aluminum-magnesium  hydroxide-simethicone , DRAH, (MI-ACID) 200-200-20 mg/5 mL suspension Take 30 mLs by mouth     AMIOdarone  (PACERONE ) 200 MG tablet Take 200 mg by mouth once daily     amLODIPine  (NORVASC ) 5 MG tablet Take 5 mg by mouth once daily     apixaban  (ELIQUIS ) 5 mg tablet Take 5 mg by mouth 2 (two) times daily     atorvastatin  (LIPITOR) 40 MG tablet Take 40 mg by mouth once daily     blood-glucose meter Misc 1 each     cyclobenzaprine  (FLEXERIL ) 5 MG tablet Take 5 mg by mouth 3 (three) times daily as needed for Muscle spasms     diclofenac  (VOLTAREN ) 1 % topical gel Apply 4 g topically     digoxin  (LANOXIN ) 0.125 MG tablet TAKE 1 TABLET BY MOUTH DAILY     folic acid  (FOLVITE ) 1 MG tablet Take 1 mg by mouth once daily     FUROsemide  (LASIX ) 40 MG tablet Take 40 mg by mouth  once daily     ibuprofen  (MOTRIN ) 200 MG tablet      irbesartan  (AVAPRO ) 150 MG tablet Take 1 tablet by mouth once daily     JARDIANCE  10 mg tablet Take 10 mg by mouth once daily     losartan  (COZAAR ) 25 MG tablet TAKE 1/2 TABLETS (12.5 MG TOTAL) BY MOUTH DAILY.     metFORMIN  (GLUCOPHAGE -XR) 500 MG XR tablet Take by mouth     methIMAzole  (TAPAZOLE ) 10 MG tablet Take 20 mg by mouth 2 (two) times daily     metoprolol  SUCCinate (TOPROL -XL) 100 MG XL tablet as  directed     metoprolol  SUCCinate (TOPROL -XL) 50 MG XL tablet Take 50 mg by mouth     miscellaneous medical supply Misc Check blood pressure as instructed by your physician     nitrofurantoin , macrocrystal-monohydrate, (MACROBID ) 100 MG capsule      nitroGLYcerin  (NITROSTAT ) 0.4 MG SL tablet as directed     omeprazole (PRILOSEC) 20 MG DR capsule Take 20 mg by mouth 2 (two) times daily     oxyCODONE  (OXYIR) 10 mg immediate release tablet Take 10 mg by mouth     pantoprazole  (PROTONIX ) 40 MG DR tablet Take 40 mg by mouth 2 (two) times daily     polyethylene glycol (MIRALAX ) packet Take 17 g by mouth     potassium chloride  (KLOR-CON ) 20 MEQ ER tablet Take by mouth     prednisoLONE  acetate (PRED FORTE ) 1 % ophthalmic suspension APPLY 1 DROP INTO RIGHT EYE FOUR TIMES A DAY SHAKE BOTTLE BEFORE EACH USE.     RELION ULTIMA test strip 1 each     RYBELSUS  7 mg tablet Take 7 mg by mouth     sacubitriL -valsartan  (ENTRESTO ) 49-51 mg tablet Take 1 tablet by mouth 2 (two) times daily     sacubitriL -valsartan  (ENTRESTO ) 97-103 mg tablet      sennosides-docusate (SENOKOT-S) 8.6-50 mg tablet Take 1 tablet by mouth at bedtime     spironolactone  (ALDACTONE ) 25 MG tablet Take by mouth     STIOLTO RESPIMAT  2.5-2.5 mcg/actuation inhaler INHALE 2 PUFFS BY MOUTH INTO THE LUNGS DAILY     SYMBICORT  160-4.5 mcg/actuation inhaler TAKE 2 PUFFS BY MOUTH TWICE A DAY     TRUEDRAW LANCING DEVICE Misc      TRUEPLUS LANCETS      valACYclovir  (VALTREX ) 1000 MG tablet TAKE 1 TABLET BY MOUTH 3 TIMES A DAY FOR 10 DAYS AFTER 10 DAYS BEGIN TAKING 1 TABLET EVERY EVENING     No current facility-administered medications for this visit.    SECONDARY CONDITIONS THAT INFLUENCE TREATMENT AND DECISION-MAKING:  Current everyday smoker   Past Medical History: Obesity, CHF status post ICD placement, atrial fibrillation on Eliquis , hyperlipidemia on statin, hypertension, valvular heart disease, COPD on oxygen, type 2  diabetes not on insulin , history of alcohol abuse, history of chronic hepatitis C, hypothyroid   Past Surgical History: AICD with single lead   Relevant Orthopedic Family History: None  Physical Examination:   BP 118/74   Ht 185.4 cm (6' 1)   Wt (!) 117.9 kg (260 lb)   BMI 34.30 kg/m  General/Constitutional: Well-nourished, well developed, no apparent distress. Psych: Normal mood and affect.  Conversant.  Judgement intact. Musculoskeletal: Shoulder Exam: Inspection   Left  Skin Normal appearance with no obvious deformity.  No ecchymosis or erythema.    Tenderness  Left  + biceps, lateral shoulder   No bony TTP.  No AC, pec minor, upper trap, or periscapular TTP.    ROM   Left  Active (Passive) Forward Elevation  Limited to 70 degrees with pain (130 with pain)  ER Limited to 30 degrees with pain (60 with pain)  IR SI  90 degree abduction ER/IR Limited arc of motion  Crepitus Absent        Rotator Cuff   Left  Full Can 3/5 with pain  ER Strength at the side 3/5 with pain    Comprehensive Elbow Exam: Inspection   Left  Skin Normal appearance with no obvious deformity.  No ecchymosis or erythema.  Soft Tissue No focal soft tissue swelling over the lateral elbow (improved)    Palpation    Left  Tenderness + radial head   No medial/lateral epicondyle, triceps, biceps pain  Crepitus None  Effusion No joint effusion (improved)    Range of Motion   Left  Flexion  Full AROM to 140 degrees without pain  Extension  Lacking 15-20 degrees of extension with pain   Pronation Full to 90 degrees with pain  Supination Limited to 70 degrees with pain    Strength   Left  Flexion  5/5  Extension  5/5  Pronation GWW  Supination GWW  Wrist Flexion 5/5  Wrist Extension 5/5    Neurovascular   Left  Distal Motor Normal  Distal Sensory Normal light touch sensation  Distal Pulses Normal    Tests Performed/Ordered:    None  Tests Previously Reviewed:  EXAM: Left Elbow Radiographs - 3 views (AP, Lateral, Oblique) performed 05/24/2024   CLINICAL INFORMATION: Left elbow pain after a fall; possible radiographically occult fracture   COMPARISON: Left Elbow Radiographs - 4 views (AP, Lateral, Oblique x2) performed 05/08/2024 at [facility]   FINDINGS:   Normal elbow alignment.  Improvement to elbow joint effusion but still appearance of anterior joint effusion.  Small calcification and step-off to the radial head likely represents nondisplaced fracture.  No dislocations.  No soft tissue swelling.  Well maintained joint spaces without evidence of degenerative joint disease.  No loose bodies.  No abnormal bone lesions.  Mild enthesopathic changes to the olecranon.  -------------------------------------------------------------------------------------------------------------- EXAM: Left Shoulder Radiographs - 3 views (AP IR/ER, Scapular Y) performed 05/08/2024 at [facility]   FINDINGS:   Elevation of the humeral head within the glenoid representing chronic rotator cuff tear arthropathy.  Mild-moderate glenohumeral joint arthritis changes well with joint space narrowing, osteophyte formation, subchondral sclerosis.  Type III acromion with large osteophyte.  No fractures or dislocations.  No soft tissue swelling or joint effusion.  No destructive bony lesion is identified.  I personally reviewed and visualized the imaging studies if available.   Assessment:     ICD-10-CM  1. Fall, subsequent encounter  W19.XXXD  2. Left elbow pain  M25.522  3. Closed nondisplaced fracture of head of left radius with routine healing, subsequent encounter  S52.125D  4. Chronic left shoulder pain  M25.512   G89.29  5. Rotator cuff tear arthropathy of left shoulder  M75.102   M12.812    Plan:   I have discussed the nature of his current subjective complaints, clinical examination, test results and have reviewed treatment options.  The  plan is to do the following;  - The patient has multiple issues addressed today:  LEFT RADIAL HEAD FRACTURE = He has improvement in his symptoms of nondisplaced radial head fracture after an  injury about 4 weeks ago.  Less swelling, less effusion, improved range of motion.  Last x-ray showed stable appearance to this fracture and improved elbow effusion.  I discussed the nonsurgical treatment of this with pain-free range of motion outside of sling, home exercise, occupational therapy, ice/compression, topical pain cream, medication.  Continue with this then follow-up in 6 weeks for reevaluation.  He should not need repeat x-ray.  LEFT SHOULDER ROTATOR CUFF ARTHROPATHY = He has improvement in pain and function of the left shoulder which is likely due to his left elbow feeling better.  He still does notice difficulty reaching overhead and pain along the lateral shoulder.  Previous x-ray from the hospital shows rotator cuff tear arthropathy changes.  I discussed the treatment options for this with activity modification, home exercise, physical therapy, medication, topical pain cream, steroid injection.  He feels like this shoulder is limiting him more so he would like to try a steroid injection.  See procedure note for full details.  If this does not provide adequate symptom relief, then we could consider glenohumeral joint steroid injection.   - Daily activity as tolerated. Modify as needed according to symptoms.  Limit pushing through the left upper extremity.  No need for immobilization or bracing. - Continue home exercise program to maintain strength, flexibility, and endurance. - Use chronic medication, Tylenol , topical diclofenac /pain cream/pain patch, relative rest, compression, and ice/heat as needed for pain.   - Follow up in 6 weeks for reevaluation.  He should not need repeat x-rays.  LEFT ultrasound guided subacromial bursa Injection   Consent After discussing the various treatment options  for the condition,  It was agreed that a corticosteroid injection would be the next step in treatment.  The nature of and the indications for a corticosteroid and / or local anaesthetic injection were reviewed in detail with the patient today.  The inherent risks of injection including infection, allergic reaction, increased pain, incomplete relief or temporary relief of symptoms, alterations of blood glucose levels requiring careful monitoring and treatment as indicated, tendon, ligament or articular cartilage rupture or degeneration, nerve injury, skin depigmentation, and/or fatty atrophy were discussed.    Indication for Ultrasound Inability to precisely localize the target using palpation or surface landmarks due to body habitus   Procedure After the risks and benefits of the procedure were explained, consent was given, and time-out was performed.  The left subacromial space and surrounding structures were visualized with ultrasound. There was an apparent full thickness rotator cuff tear The site for the injection was properly marked and prepped with Chlorhexadine/Isopropyl alcohol solution.      The injection site was anesthetized with ethyl chloride. Using ultrasound guidance, the subacromial bursa was visualized and injected with 6 milligrams of Betamethasone, 2 milliliters of 0.25% Bupivacaine, and 2 milliliters of 1% Lidocaine  using a sterile technique and a 22 gauge 1.5 inch needle. During injection, there was unrestricted flow and care was taken not to inject corticosteroid into the skin or subcutaneous tissues.    A sterile band-aide was applied.  Post-injection instructions were given regarding post-procedure care, when to follow up in clinic and what to expect from the procedure.  The patient tolerated the injection well and was discharged without complication.     Contact our office with any questions or concerns.  Follow up as indicated, or sooner should any new problems arise, if  conditions worsen, or if they are otherwise concerned.    Prentice Reges, DO [facility] [address] [place], [place] [address] Phone: [phone]   This note was generated in part with voice recognition software and I apologize for any typographical errors that were not detected and corrected.  Large Joint Injection: L subacromial bursa  Date/Time: 06/14/2024 8:30 AM  Performed by: Sharrie Barter, DO Authorized by: Sharrie Barter, DO   Procedure discussed: discussed risks, benefits, and alternatives   Consent Given by:  Patient Timeout: timeout called immediately prior to procedure   Prep: patient was prepped and draped in usual sterile fashion   Indications:  Pain Needle Size:  22 G Guidance: ultrasound   Location:  Shoulder Site:  L subacromial bursa Topical skin anesthesia: obtained using ethyl chloride spray   Medications:  2 mL lidocaine  1 %; 6 mg betamethasone acetate-betamethasone sodium phosphate 6 mg/mL; 2 mL BUPivacaine HCl 0.25 % Outcome:  Tolerated well, no immediate complications Patient tolerance:  Patient tolerated the procedure well Instructions: patient was counseled as to the expected post injection course, including the possibility of temporary worsening of symptoms   patient was instructed as to concerning symptoms or signs and instructed to contact the office if these should appear

## 2024-06-15 ENCOUNTER — Encounter: Payer: Self-pay | Admitting: Family Medicine

## 2024-06-15 ENCOUNTER — Ambulatory Visit: Admitting: Family Medicine

## 2024-06-15 VITALS — BP 130/75 | HR 88 | Temp 97.9°F | Ht 72.0 in | Wt 259.0 lb

## 2024-06-15 DIAGNOSIS — I5022 Chronic systolic (congestive) heart failure: Secondary | ICD-10-CM

## 2024-06-15 DIAGNOSIS — I4819 Other persistent atrial fibrillation: Secondary | ICD-10-CM

## 2024-06-15 DIAGNOSIS — E1129 Type 2 diabetes mellitus with other diabetic kidney complication: Secondary | ICD-10-CM

## 2024-06-15 DIAGNOSIS — I10 Essential (primary) hypertension: Secondary | ICD-10-CM

## 2024-06-15 DIAGNOSIS — E1159 Type 2 diabetes mellitus with other circulatory complications: Secondary | ICD-10-CM

## 2024-06-15 DIAGNOSIS — S52125D Nondisplaced fracture of head of left radius, subsequent encounter for closed fracture with routine healing: Secondary | ICD-10-CM

## 2024-06-15 NOTE — Progress Notes (Signed)
 "  BP 130/75   Pulse 88   Temp 97.9 F (36.6 C) (Oral)   Ht 6' (1.829 m)   Wt 259 lb (117.5 kg)   SpO2 97%   BMI 35.13 kg/m    Subjective:    Patient ID: Eugene Henderson, male    DOB: [birth year], 74 y.o.   MRN: [id number]  HPI: Eugene Henderson is a 74 y.o. male  Chief Complaint  Patient presents with   Hospitalization Follow-up   Transition of Care Hospital Follow up.   Hospital/Facility: [facility] then [facility] in [place] Physician: Dr. Kandis D/C Date: 06/01/24; 06/06/24  Records Requested: 06/15/24 Records Received:  06/15/24 Records Reviewed:  06/15/24  Diagnoses on Discharge:   Atrial fibrillation with RVR (HCC) Active Problems:   Chronic systolic CHF (congestive heart failure) (HCC)   COPD (chronic obstructive pulmonary disease) (HCC)   Essential hypertension   Hyperlipidemia   Diabetes mellitus with cardiac complication (HCC)   Hyperthyroidism   Tobacco use   Obesity (BMI 30-39.9)   Alcohol abuse   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)  Date of interactive Contact within 48 hours of discharge: 06/01/24 Contact was through: direct  Date of 7 day or 14 day face-to-face visit:   06/15/24  within 14 days  Outpatient Encounter Medications as of 06/15/2024  Medication Sig   albuterol  (VENTOLIN  HFA) 108 (90 Base) MCG/ACT inhaler Inhale 1-2 puffs into the lungs every 6 (six) hours as needed for wheezing or shortness of breath.   alum & mag hydroxide-simeth (MAALOX/MYLANTA) 200-200-20 MG/5ML suspension Take 30 mLs by mouth every 4 (four) hours as needed for indigestion.   atorvastatin  (LIPITOR) 40 MG tablet TAKE 1 TABLET EVERY DAY   Blood Glucose Monitoring Suppl DEVI 1 each by Does not apply route as directed. Dispense based on patient and insurance preference. Use up to four times daily as directed. (FOR ICD-10 E10.9, E11.9).   Blood Pressure Monitoring (BLOOD PRESSURE CUFF) MISC Check blood pressure as instructed by your physician   cyclobenzaprine   (FLEXERIL ) 5 MG tablet Take 1 tablet (5 mg total) by mouth 3 (three) times daily as needed for muscle spasms.   digoxin  (LANOXIN ) 0.125 MG tablet TAKE 1/2 TABLET BY MOUTH EVERY DAY   ELIQUIS  5 MG TABS tablet TAKE 1 TABLET TWICE DAILY   empagliflozin  (JARDIANCE ) 10 MG TABS tablet TAKE 1 TABLET EVERY DAY   furosemide  (LASIX ) 40 MG tablet Take 1 tablet (40 mg total) by mouth daily.   Glucose Blood (BLOOD GLUCOSE TEST STRIPS) STRP 1 each by Does not apply route as directed. Dispense based on patient and insurance preference. Use up to four times daily as directed. (FOR ICD-10 E10.9, E11.9).   Lancet Device MISC 1 each by Does not apply route as directed. Dispense based on patient and insurance preference. Use up to four times daily as directed. (FOR ICD-10 E10.9, E11.9).   Lancets MISC 1 each by Does not apply route as directed. Dispense based on patient and insurance preference. Use up to four times daily as directed. (FOR ICD-10 E10.9, E11.9).   metFORMIN  (GLUCOPHAGE ) 500 MG tablet TAKE 1 TABLET TWICE DAILY WITH MEALS   methimazole  (TAPAZOLE ) 10 MG tablet Take 20 mg by mouth 2 (two) times daily.   metoprolol  succinate (TOPROL -XL) 50 MG 24 hr tablet Take 1 tablet (50 mg total) by mouth 2 (two) times daily. Reduced from 150 mg.   nitroGLYCERIN  (NITROSTAT ) 0.4 MG SL tablet Place 0.4 mg under the tongue every 5 (five)  minutes as needed.   polyethylene glycol (MIRALAX  / GLYCOLAX ) 17 g packet Take 17 g by mouth daily as needed for mild constipation or moderate constipation (not helped with stool softner).   potassium chloride  SA (KLOR-CON  M) 20 MEQ tablet TAKE 1 TABLET BY MOUTH EVERY DAY   sacubitril -valsartan  (ENTRESTO ) 49-51 MG Take 1 tablet by mouth 2 (two) times daily.   senna-docusate (SENOKOT-S) 8.6-50 MG tablet Take 1 tablet by mouth at bedtime.   Tiotropium Bromide-Olodaterol (STIOLTO RESPIMAT ) 2.5-2.5 MCG/ACT AERS Inhale 2 puffs into the lungs daily.   [DISCONTINUED] acetaminophen  (TYLENOL ) 500 MG  tablet Take 1-2 tablets (500-1,000 mg total) by mouth every 6 (six) hours as needed for mild pain (pain score 1-3), fever or headache. (Patient taking differently: Take 1,000 mg by mouth 2 (two) times daily.)   [DISCONTINUED] pantoprazole  (PROTONIX ) 40 MG tablet Take 1 tablet (40 mg total) by mouth 2 (two) times daily.   [DISCONTINUED] Semaglutide  (RYBELSUS ) 7 MG TABS Take 1 tablet (7 mg total) by mouth daily.   [DISCONTINUED] spironolactone  (ALDACTONE ) 25 MG tablet Take 1 tablet (25 mg total) by mouth daily.   [DISCONTINUED] traMADol  (ULTRAM ) 50 MG tablet Take 50 mg by mouth every 6 (six) hours as needed.   acetaminophen  (TYLENOL ) 500 MG tablet Take 1-2 tablets (500-1,000 mg total) by mouth every 6 (six) hours as needed for mild pain (pain score 1-3), fever or headache.   pantoprazole  (PROTONIX ) 40 MG tablet Take 1 tablet (40 mg total) by mouth 2 (two) times daily.   Semaglutide  (RYBELSUS ) 7 MG TABS Take 1 tablet (7 mg total) by mouth daily.   spironolactone  (ALDACTONE ) 25 MG tablet Take 1 tablet (25 mg total) by mouth daily.   [DISCONTINUED] oxyCODONE  (OXY IR/ROXICODONE ) 5 MG immediate release tablet Take 1 tablet (5 mg total) by mouth every 6 (six) hours as needed (Moderate pain). (Patient not taking: Reported on 06/15/2024)   No facility-administered encounter medications on file as of 06/15/2024.  Hospital Course: History of present illness:  From admission h and p Eugene Henderson is a 74 y.o. male with medical history significant of A fib on Eliquis , sCHF with EF 40-45%, HTN, HLD, DM, COPD, tobacco abuse, obesity, hyperthyroidism, medication noncompliance, who presents with SOB.   Patient was recently hospitalized due to A-fib with RVR, COPD and CHF exacerbation, also had nondisplaced fracture of the lateral aspect of the radial head of left shoulder. Pt was discharged to SNF on 3L of oxygen.  Patient states that he was discharged home yesterday with empty oxygen tank from SNF. He states that he  took his medications yesterday but not today. He states that he continues to have dry cough and SOB.  He also reports some mild substernal chest pain.  No fever or chills.  No nausea, vomiting, diarrhea or abdominal pain.  No symptoms of UTI.   Hospital Course:    Patient presents with SOB. Found to be in mild a-fib rvr. Cardiology deemed this to be permanent condition so stopped home amio and up-titrated home metoprolol . RVR resolved. Dyspnea resolved, breathing comfortably on room air. 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 PT advised SNF so this was arranged, discharged there. Will need f/u with chf clinic and cardiology, also f/u with orthopedics. Per ortho, plan for the radial head fracture is: nonsurgical treatment of this injury with sling immobilization  but performing range of motion exercises as pain allows, ice/compression, topical pain cream, medications, occupational therapy.   Patient has been taking oxycodone  regularly since discharge on 12/4 for his radial head fracture. Here we reduced the dose from 10 to 5 mg. At skilled nursing, strongly advise tapering him off this medication.  Diagnostic Tests Reviewed:  CLINICAL DATA:  Shortness of breath   EXAM: CHEST - 2 VIEW   COMPARISON:  05/01/2024   FINDINGS: Left-sided single lead pacing device as before. Cardiomegaly without focal airspace disease, pleural effusion or pneumothorax. Minimal atelectasis or scarring at the lingula.   IMPRESSION: No active cardiopulmonary disease. Cardiomegaly.  CTA CHEST 05/28/2024 09:29:47 PM   TECHNIQUE: CTA of the chest was performed after the administration of intravenous contrast. Multiplanar reformatted images are provided for review. MIP images are provided for review. Automated exposure control, iterative  reconstruction, and/or weight based adjustment of the mA/kV was utilized to reduce the radiation dose to as low as reasonably achievable.   COMPARISON: AP and lateral chest for today, CTA chest 05/04/2024, CTA chest 06/14/2022.   CLINICAL HISTORY: Pulmonary embolism (PE) suspected, high prob.   FINDINGS:   PULMONARY ARTERIES: Pulmonary arteries are adequately opacified for evaluation. Enlarged pulmonary trunk measures 3.4 cm, consistent with arterial hypertension.   No arterial embolism is seen to the segmental level, the subsegmental arterial bed is poorly evaluated due to respiratory motion.   MEDIASTINUM: There is metal artifact from a single lead left sided cardiac assist device, the tip of the wire is in the right ventricle.   There is moderate cardiomegaly with biatrial chamber predominance. No pericardial effusion. Scattered three-vessel coronary calcifications noted.   There is aortic tortuosity, patchy atherosclerosis, and ascending aortic dilatation to 4.7 cm as before. Scattered calcifications in the great vessels. There is no dissection or stenosis.   LYMPH NODES: There are mildly prominent mediastinal and bilateral hilar lymph nodes with some showing calcifications.   Examples include right paratracheal chain nodes up to 1.2 cm, pretracheal lymph nodes up to 1 cm with precarinal and subcarinal lymph nodes to 1.3 cm. Largest right hilar lymph node noted is 1.3 cm. Axillary spaces are clear.   LUNGS AND PLEURA: There are mild centrilobular and paraseptal emphysema changes in the upper lobes. Diffuse bronchial thickening.   There are posterior atelectatic changes in the lungs, but no consolidation, suspicious nodule or effusion. The trachea and central airways are clear. No pneumothorax.   UPPER ABDOMEN: In the abdomen, there is a nodular liver contour compatible with cirrhosis but no splenomegaly or ascites. There is fatty infiltration in the pancreas.  No acute upper abdominal findings.   SOFT TISSUES AND BONES: Degenerative changes in the thoracic spine. No acute or significant osseous findings. Bilateral acromiohumeral abutment is chronically seen consistent with chronic rotator cuff arthropathy with likely degenerative tears.   IMPRESSION: 1. No evidence of pulmonary embolism to the segmental level; subsegmental arteries are poorly evaluated due to respiratory motion. 2. Enlarged pulmonary trunk measuring 3.4 cm, consistent with pulmonary arterial hypertension. Previously 3. Moderate cardiomegaly with biatrial chamber predominance, without pericardial effusion. 3-vessel coronary artery disease (cad). 4. Ascending aortic dilatation to 4.7 cm, without dissection or stenosis. Semiannual CTA or MRA follow-up and referral to cardiothoracic surgery recommended if not already done . 5. Mild centrilobular and paraseptal emphysema in the upper lobes; pulmonary emphysema is an independent risk factor for lung cancer, and consideration is recommended for evaluation for a low-dose CT lung cancer screening program. 6. No  consolidation, suspicious nodule, or pleural effusion.  Disposition: SNF  Consults: Cardiology  Discharge Instructions:  Chf clinic f/u 1/15 Cardiology f/u [place]) Orthopedics f/u as scheduled Pcp f/u  Strongly advise only continuing opioids for a few more days  Disease/illness Education: Discussed today  Home Health/Community Services Discussions/Referrals: In place  Establishment or re-establishment of referral orders for community resources: In place  Discussion with other health care providers: None  Assessment and Support of treatment regimen adherence: Fair  Appointments Coordinated with: Patient  Education for self-management, independent living, and ADLs: Discussed today  Since getting out of the hospital. Arnold has been feeling well. He notes that his arm is feeling better, although still aches a  little bit. He's not on pain medicine any more and has not been taking his tylenol . He denies any CP or SOB. He hasn't felt his heart racing. He has not gone over to see cardiology since he got out of the hospital, but he has seen ortho and pulmonology. He has an appointment to see cardiology next week. He notes that he is generally feeling well with no other concerns or complaints at this time.   Relevant past medical, surgical, family and social history reviewed and updated as indicated. Interim medical history since our last visit reviewed. Allergies and medications reviewed and updated.  Review of Systems  Constitutional: Negative.   Respiratory: Negative.    Cardiovascular: Negative.   Musculoskeletal:  Positive for arthralgias and myalgias. Negative for back pain, gait problem, joint swelling, neck pain and neck stiffness.  Skin: Negative.   Neurological: Negative.   Psychiatric/Behavioral: Negative.      Per HPI unless specifically indicated above     Objective:    BP 130/75   Pulse 88   Temp 97.9 F (36.6 C) (Oral)   Ht 6' (1.829 m)   Wt 259 lb (117.5 kg)   SpO2 97%   BMI 35.13 kg/m   Wt Readings from Last 3 Encounters:  06/21/24 262 lb 2 oz (118.9 kg)  06/15/24 259 lb (117.5 kg)  06/14/24 260 lb (117.9 kg)    Physical Exam Vitals and nursing note reviewed.  Constitutional:      General: He is not in acute distress.    Appearance: Normal appearance. He is not ill-appearing, toxic-appearing or diaphoretic.  HENT:     Head: Normocephalic and atraumatic.     Right Ear: External ear normal.     Left Ear: External ear normal.     Nose: Nose normal.     Mouth/Throat:     Mouth: Mucous membranes are moist.     Pharynx: Oropharynx is clear.  Eyes:     General: No scleral icterus.       Right eye: No discharge.        Left eye: No discharge.     Extraocular Movements: Extraocular movements intact.     Conjunctiva/sclera: Conjunctivae normal.     Pupils: Pupils are  equal, round, and reactive to light.  Cardiovascular:     Rate and Rhythm: Normal rate and regular rhythm.     Pulses: Normal pulses.     Heart sounds: Normal heart sounds. No murmur heard.    No friction rub. No gallop.  Pulmonary:     Effort: Pulmonary effort is normal. No respiratory distress.     Breath sounds: Normal breath sounds. No stridor. No wheezing, rhonchi or rales.  Chest:     Chest wall: No tenderness.  Musculoskeletal:  General: Normal range of motion.     Cervical back: Normal range of motion and neck supple.  Skin:    General: Skin is warm and dry.     Capillary Refill: Capillary refill takes less than 2 seconds.     Coloration: Skin is not jaundiced or pale.     Findings: No bruising, erythema, lesion or rash.  Neurological:     General: No focal deficit present.     Mental Status: He is alert and oriented to person, place, and time. Mental status is at baseline.  Psychiatric:        Mood and Affect: Mood normal.        Behavior: Behavior normal.        Thought Content: Thought content normal.        Judgment: Judgment normal.     Results for orders placed or performed in visit on 06/15/24  CBC with Differential/Platelet   Collection Time: 06/15/24  2:04 PM  Result Value Ref Range   WBC 5.0 3.4 - 10.8 x10E3/uL   RBC 4.22 4.14 - 5.80 x10E6/uL   Hemoglobin 11.5 (L) 13.0 - 17.7 g/dL   Hematocrit 63.9 (L) 62.4 - 51.0 %   MCV 85 79 - 97 fL   MCH 27.3 26.6 - 33.0 pg   MCHC 31.9 31.5 - 35.7 g/dL   RDW 84.2 (H) 88.3 - 84.5 %   Platelets 253 150 - 450 x10E3/uL   Neutrophils 67 Not Estab. %   Lymphs 20 Not Estab. %   Monocytes 13 Not Estab. %   Eos 0 Not Estab. %   Basos 0 Not Estab. %   Neutrophils Absolute 3.3 1.4 - 7.0 x10E3/uL   Lymphocytes Absolute 1.0 0.7 - 3.1 x10E3/uL   Monocytes Absolute 0.6 0.1 - 0.9 x10E3/uL   EOS (ABSOLUTE) 0.0 0.0 - 0.4 x10E3/uL   Basophils Absolute 0.0 0.0 - 0.2 x10E3/uL   Immature Granulocytes 0 Not Estab. %    Immature Grans (Abs) 0.0 0.0 - 0.1 x10E3/uL  Comprehensive metabolic panel with GFR   Collection Time: 06/15/24  2:04 PM  Result Value Ref Range   Glucose 100 (H) 70 - 99 mg/dL   BUN 9 8 - 27 mg/dL   Creatinine, Ser 9.20 0.76 - 1.27 mg/dL   eGFR 94 >40 fO/fpw/8.26   BUN/Creatinine Ratio 11 10 - 24   Sodium 139 134 - 144 mmol/L   Potassium 4.1 3.5 - 5.2 mmol/L   Chloride 104 96 - 106 mmol/L   CO2 20 20 - 29 mmol/L   Calcium  9.5 8.6 - 10.2 mg/dL   Total Protein 7.3 6.0 - 8.5 g/dL   Albumin 4.2 3.8 - 4.8 g/dL   Globulin, Total 3.1 1.5 - 4.5 g/dL   Bilirubin Total 0.7 0.0 - 1.2 mg/dL   Alkaline Phosphatase 91 47 - 123 IU/L   AST 11 0 - 40 IU/L   ALT 10 0 - 44 IU/L      Assessment & Plan:   Problem List Items Addressed This Visit       Cardiovascular and Mediastinum   Chronic systolic heart failure (HCC) (Chronic)   Euvolemic today. Continue to follow with cardiology- due to see the next week. Continue to monitor. Call with any concerns.       Relevant Medications   spironolactone  (ALDACTONE ) 25 MG tablet   Other Relevant Orders   CBC with Differential/Platelet (Completed)   Comprehensive metabolic panel with GFR (Completed)   Persistent atrial fibrillation (  HCC)   Continue to follow with cardiology- due to see the next week. Continue to monitor. Call with any concerns.       Relevant Medications   spironolactone  (ALDACTONE ) 25 MG tablet   Essential hypertension   Under good control on current regimen. Continue current regimen. Continue to monitor. Call with any concerns. Refills given. Labs drawn today. Await results.        Relevant Medications   spironolactone  (ALDACTONE ) 25 MG tablet   Other Relevant Orders   CBC with Differential/Platelet (Completed)   Comprehensive metabolic panel with GFR (Completed)   Diabetes mellitus with cardiac complication (HCC) - Primary   Under good control when A1c was checked in November. Will recheck in about a month. Call with any  concerns. Continue current regimen.       Relevant Medications   Semaglutide  (RYBELSUS ) 7 MG TABS   spironolactone  (ALDACTONE ) 25 MG tablet   Other Relevant Orders   CBC with Differential/Platelet (Completed)   Comprehensive metabolic panel with GFR (Completed)   Other Visit Diagnoses       Closed nondisplaced fracture of head of left radius with routine healing, subsequent encounter       Continue to follow with ortho. Continue tylenol  as needed. Call with any concerns.        Follow up plan: Return in about 4 weeks (around 07/13/2024).      "

## 2024-06-16 LAB — COMPREHENSIVE METABOLIC PANEL WITH GFR
ALT: 10 IU/L (ref 0–44)
AST: 11 IU/L (ref 0–40)
Albumin: 4.2 g/dL (ref 3.8–4.8)
Alkaline Phosphatase: 91 IU/L (ref 47–123)
BUN/Creatinine Ratio: 11 (ref 10–24)
BUN: 9 mg/dL (ref 8–27)
Bilirubin Total: 0.7 mg/dL (ref 0.0–1.2)
CO2: 20 mmol/L (ref 20–29)
Calcium: 9.5 mg/dL (ref 8.6–10.2)
Chloride: 104 mmol/L (ref 96–106)
Creatinine, Ser: 0.79 mg/dL (ref 0.76–1.27)
Globulin, Total: 3.1 g/dL (ref 1.5–4.5)
Glucose: 100 mg/dL — ABNORMAL HIGH (ref 70–99)
Potassium: 4.1 mmol/L (ref 3.5–5.2)
Sodium: 139 mmol/L (ref 134–144)
Total Protein: 7.3 g/dL (ref 6.0–8.5)
eGFR: 94 mL/min/1.73

## 2024-06-16 LAB — CBC WITH DIFFERENTIAL/PLATELET
Basophils Absolute: 0 x10E3/uL (ref 0.0–0.2)
Basos: 0 %
EOS (ABSOLUTE): 0 x10E3/uL (ref 0.0–0.4)
Eos: 0 %
Hematocrit: 36 % — ABNORMAL LOW (ref 37.5–51.0)
Hemoglobin: 11.5 g/dL — ABNORMAL LOW (ref 13.0–17.7)
Immature Grans (Abs): 0 x10E3/uL (ref 0.0–0.1)
Immature Granulocytes: 0 %
Lymphocytes Absolute: 1 x10E3/uL (ref 0.7–3.1)
Lymphs: 20 %
MCH: 27.3 pg (ref 26.6–33.0)
MCHC: 31.9 g/dL (ref 31.5–35.7)
MCV: 85 fL (ref 79–97)
Monocytes Absolute: 0.6 x10E3/uL (ref 0.1–0.9)
Monocytes: 13 %
Neutrophils Absolute: 3.3 x10E3/uL (ref 1.4–7.0)
Neutrophils: 67 %
Platelets: 253 x10E3/uL (ref 150–450)
RBC: 4.22 x10E6/uL (ref 4.14–5.80)
RDW: 15.7 % — ABNORMAL HIGH (ref 11.6–15.4)
WBC: 5 x10E3/uL (ref 3.4–10.8)

## 2024-06-18 ENCOUNTER — Telehealth: Payer: Self-pay

## 2024-06-18 ENCOUNTER — Ambulatory Visit: Payer: Self-pay | Admitting: Family Medicine

## 2024-06-18 NOTE — Telephone Encounter (Signed)
 BIOTRONIK ALERT:   No messages received for at least 21 days Last message received 22 days ago. The patient will be deactivated in 67 days.

## 2024-06-19 ENCOUNTER — Ambulatory Visit: Admitting: Podiatry

## 2024-06-19 NOTE — Telephone Encounter (Signed)
 LM for patient's daughter to call us  back at [phone] to confirm monitor is working (plugged in and reading ok).

## 2024-06-20 ENCOUNTER — Telehealth: Payer: Self-pay | Admitting: Family

## 2024-06-20 NOTE — Telephone Encounter (Signed)
 Pt with recent hospitalization and discharge to SNF.  No action needed at this time.

## 2024-06-20 NOTE — Telephone Encounter (Signed)
 Called to confirm/remind patient of [facility] on [facility].   Appointment:   [x] Confirmed  [] Left mess   [] No answer/No voice mail  [] VM Full/unable to leave message  [] Phone not in service  Patient reminded to bring all medications and/or complete list.  Confirmed patient has transportation. Gave directions, instructed to utilize valet parking.

## 2024-06-20 NOTE — Progress Notes (Signed)
 "  Advanced Heart Failure Clinic Note     PCP: Vicci Duwaine SQUIBB, DO HF Provider: Dr Rolan, MD  Chief Complaint: shortness of breath   HPI: Eugene Henderson is a 74 y.o. male with nonischemic cardiomyopathy based on 2018 cath, HTN, T2DM, HFrEF, long standing persistent atrial fibrillation, hx of tobacco use.  He was admitted on 06/14/22 with complains of chest pain and shortness of breath. On admission, patient noted to be in atrial fibrillation w/ RVR.  He was diuresed and underwent DCCV to NSR.  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  Echo in 1/24 showed EF 20-25% with severe RV dysfunction. Cardiac MRI was done in 2/24, showing LV EF 17%, RV EF 36%, small area of lateral wall subendocardial LGE, mid-wall LGE at the inferoseptal RV insertion site (nonspecific). There was a very small area of possible prior MI, but this study suggests primarily nonischemic cardiomyopathy.    RHC in 4/24 showed normal filling pressures with mildly decreased CI at 2.2.  He failed DCCV on amiodarone  in 4/24.  Biotronik ICD was placed in 5/24.   He saw Dr. Cindie 08/2023 with chest pain. PET stress was ordered which was abnormal partially reversible location. EF 30-35%   R/L heart cath 12/16/23 showed minor irregularities with no evidence of CAD. RHC showed moderately elevated right atrial pressure, moderate pulmonary HTN and normal CO.   Admitted 05/28/24 with dry cough & SOB. Found to be in AFRVR. Amiodarone  stopped due to permanent AF.   Admitted 04/12/24 with a near syncopal event and orthostatic hypotension in the setting of a 1 week history of diarrhea with decreased oral intake. In the ED he was orthostatic with systolic dropping from 107 lying to 84 standing. Given IVF with resolution of orthostasis. Antibiotics given for UTI.   Admitted 05/01/24 with SOB along with sputum  production. Normal BP in the ER. CBC without leukocytosis but had mildly elevated proBNP. CXR negative. Was in AF RVR and received cardizem  and metoprolol . Given prednisone  and doxycycline  due to COPD exacerbation. Echo (11/25): 40-45%, mildly reduced RV, severely elevated PA pressure of 64.3 mmHg, severe BAE, moderate MR   Patient returns for followup of CHF although has been lost to follow-up since 11/24. He presents with a chief complaint of moderate shortness of breath. Has associated fatigue, chest tightness when feeling short of breath, dizziness when standing up and slight pedal edema. Unsure if he snores or not. When he gets SOB, he uses his albuterol  inhaler which he says improves his breathing. He is unclear of what meds he takes.   Labs (1/24): K 4.3, creatinine 0.86, LDL 30 Labs (2/24): K 3, creatinine 0.89 Labs (4/24): LFTs normal, TSH normal Labs (5/24): K 4.3, creatinine 1.32 => 0.9, BNP 174 Labs (8/24): LDL 34, LFTs normal, K 4.5, creatinine 0.98, hgb 14.7 Labs (9/24): K 4.1, creatinine 0.8, LFTs normal, TSH normal Labs (11/24): K 4.6, creatinine 0.87, LFT's normal, BNP 228.5, digoxin  <0.5, TSH 3.598 Labs (2/25): K 4, creatinine 0.95, LFT's normal, LDL 23, Hg 13.4 Labs (6/25): K 4.1, creatinine 1.05, alk phos 150, Hg 13.8 Labs (7/25): sodium 132, K 4.9, creatinine 1.09, Hg 12.9 Labs (9/25): K 4.4, creatinine 1.09, LFT's normal, LDL 42, Hg 13.8 Labs (11/25): K 3.7, creatinine 0.76, Hg 9.7, A1c 6.2% Labs (12/25): sodium 129, K 4, creatinine 0.95, Mg 2.5, proBNP 1026, Hg 12.2, TSH 1.650  Labs (01/26): K 4.1, creatinine 0.79, LFT's normal, Hg 11.5  PMH: 1. Chronic systolic CHF: Nonischemic cardiomyopathy.  Biotronik ICD.  - Echo (2018): EF 25-30% - RHC/LHC (2018): mean RA 12, PA 37/12, mean PCWP 12, CI 1.77; no significant CAD.  - Echo 10/22: EF 30$, mild LVH, low normal RV, severe BAE, mild MR, severe dilatation of the aortic root and of the ascending aorta, measuring 55 mm.  -  Echo (1/24): EF 20-25%, severely decreased RV systolic function.  - Cardiac MRI (2/24): LV EF 17%, RV EF 36%, small area of lateral wall subendocardial LGE, mid-wall LGE at the inferoseptal RV insertion site (nonspecific).  - RHC (4/24): mean RA 11, PA 40/16 mean 29, mean PCWP 13, CI 2.22, PAPi 2.2, PVR 3 WU - cMRI (04/24): LVEF 17%, Small lateral wall subendocardial LGE (scar), Mid wall LGE (scar) in the LV basal-mid septal wall at RV insertion points - Cardiac PET (06/25): mildly elevated RA pressure, normal PCWP, mild pulmonary venous HTN, mildly decreased cardiac output (CI 2.22), preserved PAPi - Echo (11/25): 40-45%, mildly reduced RV, severely elevated PA pressure of 64.3 mmHg, severe BAE, moderate MR 2. Ascending aortic aneurysm - 4.7 cm ascending aorta on CTA in 1/24.  3. HTN 4. H/o hyperthyroidism 5. Atrial fibrillation: Permanent  - 1/24 DCCV to NSR.  - Failed DCCV 4/24.  6. Type 2 diabetes 7. 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  8. COPD: Quit smoking in 2024.  - PFTs (2/24): Severe obstruction, +restriction.   Social History   Socioeconomic History   Marital status: Single    Spouse name: Not on file   Number of children: Not on file   Years of education: Not on file   Highest education level: Not on file  Occupational History   Occupation: retired  Tobacco Use   Smoking status: Former    Current packs/day: 0.00    Types: Pipe, Cigarettes    Quit date: 12/27/2022    Years since quitting: 1.4   Smokeless tobacco: Never  Vaping Use   Vaping status: Never Used  Substance and Sexual Activity   Alcohol use: Yes    Alcohol/week: 2.0 standard drinks of alcohol    Types: 2 Cans of beer per week    Comment: 2 40oz beers/week   Drug use: No   Sexual activity: Not Currently  Other Topics Concern   Not on file  Social History Narrative   Not on file    Social Drivers of Health   Tobacco Use: Medium Risk (06/15/2024)   Patient History    Smoking Tobacco Use: Former    Smokeless Tobacco Use: Never    Passive Exposure: Not on file  Financial Resource Strain: Low Risk  (06/14/2024)   Received from [facility]   Overall Financial Resource Strain (CARDIA)    Difficulty of Paying Living Expenses: Not hard at all  Food Insecurity: No Food Insecurity (06/14/2024)   Received from [facility]   Epic    Within the past 12 months, you worried that your food would run out before you got the money to buy more.: Never true    Within the past 12 months, the food you bought just didn't last and you didn't have money to get more.: Never true  Transportation Needs: No Transportation Needs (06/14/2024)   Received from [facility]  PRAPARE - Transportation    In the past 12 months, has lack of transportation kept you from medical appointments or from getting medications?: No    Lack of Transportation (Non-Medical): No  Recent Concern: Transportation Needs - Unmet Transportation Needs (03/27/2024)   Epic    Lack of Transportation (Medical): Yes    Lack of Transportation (Non-Medical): Yes  Physical Activity: Inactive (07/27/2022)   Exercise Vital Sign    Days of Exercise per Week: 0 days    Minutes of Exercise per Session: 0 min  Stress: No Stress Concern Present (07/27/2022)   [employer] of Occupational Health - Occupational Stress Questionnaire    Feeling of Stress : Not at all  Social Connections: Socially Isolated (05/30/2024)   Social Connection and Isolation Panel    Frequency of Communication with Friends and Family: More than three times a week    Frequency of Social Gatherings with Friends and Family: More than three times a week    Attends Religious Services: Never    Database Administrator or Organizations: No    Attends Banker Meetings: Never    Marital Status:  Divorced  Catering Manager Violence: Not At Risk (05/30/2024)   Epic    Fear of Current or Ex-Partner: No    Emotionally Abused: No    Physically Abused: No    Sexually Abused: No  Depression (PHQ2-9): Medium Risk (04/16/2024)   Depression (PHQ2-9)    PHQ-2 Score: 6  Alcohol Screen: Low Risk (07/27/2022)   Alcohol Screen    Last Alcohol Screening Score (AUDIT): 2  Housing: Unknown (06/14/2024)   Received from [facility]   Epic    In the last 12 months, was there a time when you were not able to pay the mortgage or rent on time?: No    Number of Times Moved in the Last Year: Not on file    At any time in the past 12 months, were you homeless or living in a shelter (including now)?: No  Utilities: Not At Risk (06/14/2024)   Received from [facility]   Epic    In the past 12 months has the electric, gas, oil, or water company threatened to shut off services in your home?: No  Health Literacy: Not on file   Family History  Problem Relation Age of Onset   Diabetes Mother    Diabetes Father    Heart disease Brother    Heart attack Brother    Diabetes Brother    Kidney failure Brother    Thyroid  disease Neg Hx    ROS: All systems reviewed and negative except as per HPI.   Current Outpatient Medications  Medication Sig Dispense Refill   acetaminophen  (TYLENOL ) 500 MG tablet Take 1-2 tablets (500-1,000 mg total) by mouth every 6 (six) hours as needed for mild pain (pain score 1-3), fever or headache. (Patient taking differently: Take 1,000 mg by mouth 2 (two) times daily.) 800 tablet 4   albuterol  (VENTOLIN  HFA) 108 (90 Base) MCG/ACT inhaler Inhale 1-2 puffs into the lungs every 6 (six) hours as needed for wheezing or shortness of breath. 18 each 6   alum & mag hydroxide-simeth (MAALOX/MYLANTA) 200-200-20 MG/5ML suspension Take 30 mLs by mouth every 4 (four) hours as needed for indigestion.     atorvastatin  (LIPITOR) 40 MG tablet TAKE 1 TABLET EVERY  DAY 90 tablet 1   Blood Glucose Monitoring Suppl DEVI 1 each by Does not apply route  as directed. Dispense based on patient and insurance preference. Use up to four times daily as directed. (FOR ICD-10 E10.9, E11.9). 1 each 0   Blood Pressure Monitoring (BLOOD PRESSURE CUFF) MISC Check blood pressure as instructed by your physician 1 each 0   cyclobenzaprine  (FLEXERIL ) 5 MG tablet Take 1 tablet (5 mg total) by mouth 3 (three) times daily as needed for muscle spasms.     digoxin  (LANOXIN ) 0.125 MG tablet TAKE 1/2 TABLET BY MOUTH EVERY DAY 90 tablet 2   ELIQUIS  5 MG TABS tablet TAKE 1 TABLET TWICE DAILY 180 tablet 3   empagliflozin  (JARDIANCE ) 10 MG TABS tablet TAKE 1 TABLET EVERY DAY 90 tablet 1   furosemide  (LASIX ) 40 MG tablet Take 1 tablet (40 mg total) by mouth daily. 90 tablet 3   Glucose Blood (BLOOD GLUCOSE TEST STRIPS) STRP 1 each by Does not apply route as directed. Dispense based on patient and insurance preference. Use up to four times daily as directed. (FOR ICD-10 E10.9, E11.9). 100 strip 0   Lancet Device MISC 1 each by Does not apply route as directed. Dispense based on patient and insurance preference. Use up to four times daily as directed. (FOR ICD-10 E10.9, E11.9). 1 each 0   Lancets MISC 1 each by Does not apply route as directed. Dispense based on patient and insurance preference. Use up to four times daily as directed. (FOR ICD-10 E10.9, E11.9). 100 each 0   metFORMIN  (GLUCOPHAGE ) 500 MG tablet TAKE 1 TABLET TWICE DAILY WITH MEALS 180 tablet 1   methimazole  (TAPAZOLE ) 10 MG tablet Take 20 mg by mouth 2 (two) times daily.     metoprolol  succinate (TOPROL -XL) 50 MG 24 hr tablet Take 1 tablet (50 mg total) by mouth 2 (two) times daily. Reduced from 150 mg.     nitroGLYCERIN  (NITROSTAT ) 0.4 MG SL tablet Place 0.4 mg under the tongue every 5 (five) minutes as needed.     pantoprazole  (PROTONIX ) 40 MG tablet Take 1 tablet (40 mg total) by mouth 2 (two) times daily. 180 tablet 1    polyethylene glycol (MIRALAX  / GLYCOLAX ) 17 g packet Take 17 g by mouth daily as needed for mild constipation or moderate constipation (not helped with stool softner).     potassium chloride  SA (KLOR-CON  M) 20 MEQ tablet TAKE 1 TABLET BY MOUTH EVERY DAY 30 tablet 0   sacubitril -valsartan  (ENTRESTO ) 49-51 MG Take 1 tablet by mouth 2 (two) times daily. 180 tablet 3   Semaglutide  (RYBELSUS ) 7 MG TABS Take 1 tablet (7 mg total) by mouth daily. 90 tablet 0   senna-docusate (SENOKOT-S) 8.6-50 MG tablet Take 1 tablet by mouth at bedtime.     spironolactone  (ALDACTONE ) 25 MG tablet Take 1 tablet (25 mg total) by mouth daily. 90 tablet 1   Tiotropium Bromide-Olodaterol (STIOLTO RESPIMAT ) 2.5-2.5 MCG/ACT AERS Inhale 2 puffs into the lungs daily. 4 g 12   No current facility-administered medications for this visit.   Vitals:   06/21/24 1408  BP: (!) 147/97  Pulse: 100  SpO2: 100%  Weight: 262 lb 2 oz (118.9 kg)  Height: 6' (1.829 m)   Wt Readings from Last 3 Encounters:  06/21/24 262 lb 2 oz (118.9 kg)  06/15/24 259 lb (117.5 kg)  06/14/24 260 lb (117.9 kg)   Lab Results  Component Value Date   CREATININE 0.79 06/15/2024   CREATININE 0.95 05/30/2024   CREATININE 0.76 05/29/2024    Physical Exam: General: Well appearing.  Cor: No JVD.  Irregular rhythm, rate.  Lungs: clear Abdomen: soft, nontender, nondistended. Extremities: no edema Neuro:. Affect pleasant   EKG 05/28/24: SVT with PVC's    Assessment/Plan: 1. Chronic systolic CHF: Nonischemic cardiomyopathy, diagnosed 2018.  Biotronik ICD. Cath in 2018 with low CI at 1.77 and no significant CAD.  Most recent echo 1/24 with EF 20-25%, severe RV dysfunction.  Cardiac MRI in 2/24 showed LV EF 17%, RV EF 36%, small area of lateral wall subendocardial LGE, mid-wall LGE at the inferoseptal RV insertion site (nonspecific). There was a very small area of possible prior MI, but this study suggests primarily nonischemic cardiomyopathy. RHC  (4/24): mean RA 11, PA 40/16 mean 29, mean PCWP 13, CI 2.22, PAPi 2.2, PVR 3 WU. cMRI (04/24): LVEF 17%, Small lateral wall subendocardial LGE (scar), Mid wall LGE (scar) in the LV basal-mid septal wall at RV insertion points. Cardiac PET (06/25): mildly elevated RA pressure, normal PCWP, mild pulmonary venous HTN, mildly decreased cardiac output (CI 2.22), preserved PAPi. Echo (11/25): 40-45%, mildly reduced RV, severely elevated PA pressure of 64.3 mmHg, severe BAE, moderate MRNYHA class II-III, does not appear to be volume up today.  - Continue digoxin  0.0625 daily. Dig level 05/28/24 was <0.6 - Continue Jardiance  10 mg daily.  - Continue Lasix  40 qam / potassium 20meq daily - Continue Toprol  50 mg BID for rate control.  - Continue Entresto  49/51mg  bid.   - Continue spironolactone  25 mg daily.  - Emphasized to bring all medications to every visit.  2. Atrial fibrillation: s/p DCCV to NSR in 1/24.  Suspect AF plays a role in his symptoms but given long-standing cardiomyopathy, suspect his cardiomyopathy is not solely tachycardia-mediated.  Failed DCCV in 4/24 despite amiodarone  use. AF is felt to be permanent - Continue apixaban  5 mg bid.  - Continue Toprol  50 mg BID for rate control.  3. COPD: Patient has been a long-standing smoker and has had dyspnea out of proportion to volume overload on exam.  I suspect significant COPD. PFTs in 2/24 showed severe obstruction. Follows with pulmonology.  - Needs to quit smoking completely => smoking socially.  4. AAA/TAA: Follows with vascular.  5. HTN: BP mildly elevated at 147/97. Was normal at PCP office last week.  - BMET reviewed from 06/15/24 6: Daytime sleepiness: Itamar study to rule out sleep apnea.  - StopBang score of 6  Return in 3 months, sooner if needed.   I spent 38 minutes reviewing records, interviewing/ examing patient and managing plan/ orders.   Ellouise DELENA Class, FNP 06/20/2024  "

## 2024-06-21 ENCOUNTER — Encounter: Payer: Self-pay | Admitting: Family

## 2024-06-21 ENCOUNTER — Ambulatory Visit: Admitting: Family Medicine

## 2024-06-21 ENCOUNTER — Ambulatory Visit: Attending: Family | Admitting: Family

## 2024-06-21 VITALS — BP 147/97 | HR 100 | Ht 72.0 in | Wt 262.1 lb

## 2024-06-21 DIAGNOSIS — Z79899 Other long term (current) drug therapy: Secondary | ICD-10-CM | POA: Diagnosis not present

## 2024-06-21 DIAGNOSIS — I428 Other cardiomyopathies: Secondary | ICD-10-CM | POA: Insufficient documentation

## 2024-06-21 DIAGNOSIS — Z7901 Long term (current) use of anticoagulants: Secondary | ICD-10-CM | POA: Diagnosis not present

## 2024-06-21 DIAGNOSIS — I712 Thoracic aortic aneurysm, without rupture, unspecified: Secondary | ICD-10-CM | POA: Diagnosis not present

## 2024-06-21 DIAGNOSIS — I110 Hypertensive heart disease with heart failure: Secondary | ICD-10-CM | POA: Diagnosis present

## 2024-06-21 DIAGNOSIS — Z7984 Long term (current) use of oral hypoglycemic drugs: Secondary | ICD-10-CM | POA: Diagnosis not present

## 2024-06-21 DIAGNOSIS — I10 Essential (primary) hypertension: Secondary | ICD-10-CM | POA: Diagnosis not present

## 2024-06-21 DIAGNOSIS — I77810 Thoracic aortic ectasia: Secondary | ICD-10-CM

## 2024-06-21 DIAGNOSIS — I5022 Chronic systolic (congestive) heart failure: Secondary | ICD-10-CM | POA: Insufficient documentation

## 2024-06-21 DIAGNOSIS — I4821 Permanent atrial fibrillation: Secondary | ICD-10-CM

## 2024-06-21 DIAGNOSIS — Z87891 Personal history of nicotine dependence: Secondary | ICD-10-CM | POA: Insufficient documentation

## 2024-06-21 DIAGNOSIS — G4710 Hypersomnia, unspecified: Secondary | ICD-10-CM | POA: Insufficient documentation

## 2024-06-21 DIAGNOSIS — E119 Type 2 diabetes mellitus without complications: Secondary | ICD-10-CM | POA: Diagnosis not present

## 2024-06-21 DIAGNOSIS — I4819 Other persistent atrial fibrillation: Secondary | ICD-10-CM | POA: Insufficient documentation

## 2024-06-21 DIAGNOSIS — J449 Chronic obstructive pulmonary disease, unspecified: Secondary | ICD-10-CM | POA: Insufficient documentation

## 2024-06-21 DIAGNOSIS — Z95810 Presence of automatic (implantable) cardiac defibrillator: Secondary | ICD-10-CM | POA: Diagnosis not present

## 2024-06-21 DIAGNOSIS — G4719 Other hypersomnia: Secondary | ICD-10-CM | POA: Diagnosis not present

## 2024-06-21 DIAGNOSIS — I714 Abdominal aortic aneurysm, without rupture, unspecified: Secondary | ICD-10-CM | POA: Insufficient documentation

## 2024-06-21 NOTE — Patient Instructions (Signed)
 Your provider has recommended that you have a home sleep study (Itamar Test).  We have provided you with the equipment in our office today, and helped you download the app. DO NOT OPEN OR TAMPER WITH THE BOX UNTIL WE ADVISE YOU TO DO SO.  Once insurance has approved the test our office will call you with PIN number and approval to proceed with testing. Once you have completed the test you just dispose of the equipment, the information is automatically uploaded to us  via blue-tooth technology. If your test is positive for sleep apnea and you need a home CPAP machine you will be contacted by Dr Dorine office [facility]) to set this up.    Follow Up with Ellouise Class, NP in 3 months.

## 2024-06-21 NOTE — Progress Notes (Signed)
 Height:  6'    Weight: 262 lb 2 oz BMI: 35.5  Today's Date: 06/21/2024  STOP BANG RISK ASSESSMENT S (snore) Have you been told that you snore?     NO   T (tired) Are you often tired, fatigued, or sleepy during the day?   YES  O (obstruction) Do you stop breathing, choke, or gasp during sleep? YES   P (pressure) Do you have or are you being treated for high blood pressure? YES   B (BMI) Is your body index greater than 35 kg/m? YES   A (age) Are you 22 years old or older? YES   N (neck) Do you have a neck circumference greater than 16 inches?   NO   G (gender) Are you a male? YES   TOTAL STOP/BANG YES ANSWERS 6                                                                       For Office Use Only              Procedure Order Form    YES to 3+ Stop Bang questions OR two clinical symptoms - patient qualifies for WatchPAT (CPT 95800)      Clinical Notes: Will consult Sleep Specialist and refer for management of therapy due to patient increased risk of Sleep Apnea. Ordering a sleep study due to the following two clinical symptoms: Excessive daytime sleepiness G47.10 / / History of high blood pressure R03.0

## 2024-06-21 NOTE — Progress Notes (Signed)
 ITAMAR home sleep study given to patient, all instructions explained, waiver signed, and CLOUDPAT registration complete.

## 2024-06-24 ENCOUNTER — Encounter: Payer: Self-pay | Admitting: Family Medicine

## 2024-06-24 MED ORDER — RYBELSUS 7 MG PO TABS: 7.0000 mg | ORAL_TABLET | Freq: Every day | ORAL | 1 refills | Status: AC

## 2024-06-24 MED ORDER — PANTOPRAZOLE SODIUM 40 MG PO TBEC: 40.0000 mg | DELAYED_RELEASE_TABLET | Freq: Two times a day (BID) | ORAL | 1 refills | Status: AC

## 2024-06-24 MED ORDER — ACETAMINOPHEN 500 MG PO TABS: 500.0000 mg | ORAL_TABLET | Freq: Four times a day (QID) | ORAL | 4 refills | Status: AC | PRN

## 2024-06-24 MED ORDER — SPIRONOLACTONE 25 MG PO TABS: 25.0000 mg | ORAL_TABLET | Freq: Every day | ORAL | 1 refills | Status: AC

## 2024-06-24 NOTE — Assessment & Plan Note (Signed)
Under good control on current regimen. Continue current regimen. Continue to monitor. Call with any concerns. Refills given. Labs drawn today. Await results.  

## 2024-06-24 NOTE — Assessment & Plan Note (Signed)
 Euvolemic today. Continue to follow with cardiology- due to see the next week. Continue to monitor. Call with any concerns.

## 2024-06-24 NOTE — Assessment & Plan Note (Signed)
 Under good control when A1c was checked in November. Will recheck in about a month. Call with any concerns. Continue current regimen.

## 2024-06-24 NOTE — Assessment & Plan Note (Signed)
 Continue to follow with cardiology- due to see the next week. Continue to monitor. Call with any concerns.

## 2024-06-25 ENCOUNTER — Telehealth (HOSPITAL_COMMUNITY): Payer: Self-pay

## 2024-06-25 NOTE — Telephone Encounter (Signed)
No precert required 

## 2024-06-25 NOTE — Telephone Encounter (Signed)
-----   Message from Nurse Paulina DEL, RN sent at 06/21/2024  2:57 PM EST ----- Regarding: Itamar Precert  Test: Verneda  Insurance: [employer] Medicare, Secondary Medicaid [place]   Location: [facility]  Dx: Chronic systolic heart failure, excessive daytime sleepiness , hypertension  Provider: [place]

## 2024-06-26 ENCOUNTER — Telehealth: Payer: Self-pay

## 2024-06-26 NOTE — Telephone Encounter (Signed)
 Spoke with pt's daughter at pt's request and explained that pt is able to take his home sleep study test now. Explained instructions and downloading the watch pat app to pt's daughter. PIN given to pt's daughter. She verbalized understanding and denied any questions. She states she will help her dad with the test.

## 2024-06-29 ENCOUNTER — Ambulatory Visit: Admitting: Podiatry

## 2024-06-29 ENCOUNTER — Telehealth (HOSPITAL_COMMUNITY): Payer: Self-pay

## 2024-06-29 DIAGNOSIS — Z0189 Encounter for other specified special examinations: Secondary | ICD-10-CM | POA: Diagnosis not present

## 2024-06-29 DIAGNOSIS — B351 Tinea unguium: Secondary | ICD-10-CM | POA: Diagnosis not present

## 2024-06-29 DIAGNOSIS — M79675 Pain in left toe(s): Secondary | ICD-10-CM | POA: Diagnosis not present

## 2024-06-29 DIAGNOSIS — E119 Type 2 diabetes mellitus without complications: Secondary | ICD-10-CM | POA: Diagnosis not present

## 2024-06-29 DIAGNOSIS — M79674 Pain in right toe(s): Secondary | ICD-10-CM | POA: Diagnosis not present

## 2024-06-29 NOTE — Addendum Note (Signed)
 Addended by: JANIT THRESA HERO on: 06/29/2024 12:36 PM   Modules accepted: Level of Service

## 2024-06-29 NOTE — Telephone Encounter (Signed)
-----   Message from Nurse Paulina DEL, RN sent at 06/21/2024  2:57 PM EST ----- Regarding: Itamar Precert  Test: Verneda  Insurance: [employer] Medicare, Secondary Medicaid [place]   Location: [facility]  Dx: Chronic systolic heart failure, excessive daytime sleepiness , hypertension  Provider: [place]

## 2024-06-29 NOTE — Progress Notes (Signed)
" ° °  Chief Complaint  Patient presents with   Nail Problem    [facility]    SUBJECTIVE Patient with a history of diabetes mellitus presents to office today complaining of elongated, thickened nails that cause pain while ambulating in shoes.  Patient is unable to trim their own nails. Patient is here for further evaluation and treatment.  Past Medical History:  Diagnosis Date   Arthritis    Ascending aortic aneurysm    a. 09/2017 Stable TAA - 5.1cm.   Asthma    Chronic systolic CHF (congestive heart failure) (HCC)    a. EF 25-30% by echo in 07/2016 with cath showing no significant CAD b. 01/2017: EF 30-35% with diffuse HK and moderate MR; c. 07/2017 Echo: EF 30-35%, diff hK. Mild MR, mildly dil LA. PASP .   Diverticulitis    Diverticulitis of large intestine with perforation without abscess or bleeding 05/13/2017   Hypertension    Hyperthyroidism    NICM (nonischemic cardiomyopathy) (HCC)    Noncompliance    Persistent atrial fibrillation (HCC)    a. CHA2DS2VASc = 3-->Eliquis  (? compliance).    Allergies[1]   OBJECTIVE General Patient is awake, alert, and oriented x 3 and in no acute distress. Derm Skin is dry and supple bilateral. Negative open lesions or macerations. Remaining integument unremarkable. Nails are tender, long, thickened and dystrophic with subungual debris, consistent with onychomycosis, 1-5 bilateral. No signs of infection noted. Vasc  DP and PT pedal pulses palpable bilaterally. Temperature gradient within normal limits.  Neuro Epicritic and protective threshold sensation diminished bilaterally.  Musculoskeletal Exam No symptomatic pedal deformities noted bilateral. Muscular strength within normal limits.  ASSESSMENT 1. Diabetes Mellitus w/ peripheral neuropathy 2.  Pain due to onychomycosis of toenails bilateral  PLAN OF CARE 1. Patient evaluated today.  Comprehensive diabetic foot exam performed today 2. Instructed to maintain good pedal hygiene and foot  care. Stressed importance of controlling blood sugar.  3. Mechanical debridement of nails 1-5 bilaterally performed using a nail nipper. Filed with dremel without incident.  4. Return to clinic in 3 mos.     Thresa EMERSON Sar, DPM [facility]  Dr. Thresa EMERSON Sar, DPM    [address] [place], [place] [address]                Office [phone]  Fax [phone]         [1] No Known Allergies  "

## 2024-06-29 NOTE — Telephone Encounter (Signed)
 No pre cert required for home sleep study.

## 2024-07-04 ENCOUNTER — Other Ambulatory Visit: Payer: Self-pay | Admitting: Family Medicine

## 2024-07-05 ENCOUNTER — Encounter: Payer: Self-pay | Admitting: Family Medicine

## 2024-07-05 NOTE — Telephone Encounter (Signed)
 Requested Prescriptions  Pending Prescriptions Disp Refills   potassium chloride  SA (KLOR-CON  M) 20 MEQ tablet [Pharmacy Med Name: POTASSIUM CL ER 20 MEQ TAB MCR] 90 tablet 0    Sig: TAKE 1 TABLET BY MOUTH EVERY DAY     Endocrinology:  Minerals - Potassium Supplementation Passed - 07/05/2024  1:02 PM      Passed - K in normal range and within 360 days    Potassium  Date Value Ref Range Status  06/15/2024 4.1 3.5 - 5.2 mmol/L Final         Passed - Cr in normal range and within 360 days    Creatinine, Ser  Date Value Ref Range Status  06/15/2024 0.79 0.76 - 1.27 mg/dL Final         Passed - Valid encounter within last 12 months    Recent Outpatient Visits           2 weeks ago Controlled type 2 diabetes mellitus with microalbuminuria (HCC)   [place] [facility] [place], Megan P, DO   3 months ago Diabetes mellitus with cardiac complication [facility])   [place] [facility] [place], Megan P, DO   4 months ago Encounter for [employer] annual wellness exam   [place] [facility] [place], [place], DO   6 months ago Dysuria   [place] [facility] [place] Everlene Parris LABOR, MD   6 months ago Wheezing    [facility] [place], [place], [place]

## 2024-07-18 ENCOUNTER — Telehealth: Admitting: *Deleted

## 2024-07-24 ENCOUNTER — Ambulatory Visit: Admitting: Family Medicine

## 2024-08-06 ENCOUNTER — Ambulatory Visit

## 2024-09-19 ENCOUNTER — Ambulatory Visit: Admitting: Family

## 2024-11-05 ENCOUNTER — Ambulatory Visit (INDEPENDENT_AMBULATORY_CARE_PROVIDER_SITE_OTHER): Admitting: Vascular Surgery

## 2024-11-05 ENCOUNTER — Encounter (INDEPENDENT_AMBULATORY_CARE_PROVIDER_SITE_OTHER)

## 2024-11-05 ENCOUNTER — Ambulatory Visit
